# Patient Record
Sex: Female | Born: 1965
Health system: Southern US, Community
[De-identification: ages and names within clinical notes are randomized; demographics above are authoritative.]

## PROBLEM LIST (undated history)

## (undated) DIAGNOSIS — R519 Headache, unspecified: Secondary | ICD-10-CM

## (undated) DIAGNOSIS — J449 Chronic obstructive pulmonary disease, unspecified: Secondary | ICD-10-CM

## (undated) DIAGNOSIS — J45909 Unspecified asthma, uncomplicated: Secondary | ICD-10-CM

## (undated) DIAGNOSIS — G8929 Other chronic pain: Secondary | ICD-10-CM

## (undated) DIAGNOSIS — E7800 Pure hypercholesterolemia, unspecified: Secondary | ICD-10-CM

## (undated) DIAGNOSIS — I10 Essential (primary) hypertension: Secondary | ICD-10-CM

## (undated) DIAGNOSIS — I509 Heart failure, unspecified: Secondary | ICD-10-CM

## (undated) DIAGNOSIS — I209 Angina pectoris, unspecified: Secondary | ICD-10-CM

## (undated) DIAGNOSIS — I499 Cardiac arrhythmia, unspecified: Secondary | ICD-10-CM

## (undated) DIAGNOSIS — R51 Headache: Secondary | ICD-10-CM

## (undated) DIAGNOSIS — D649 Anemia, unspecified: Secondary | ICD-10-CM

## (undated) HISTORY — DX: Pure hypercholesterolemia, unspecified: E78.00

## (undated) HISTORY — DX: Other chronic pain: G89.29

## (undated) HISTORY — DX: Headache, unspecified: R51.9

## (undated) HISTORY — DX: Unspecified asthma, uncomplicated: J45.909

## (undated) HISTORY — DX: Headache: R51

---

## 1984-10-24 HISTORY — PX: TUBAL LIGATION: SHX77

## 2009-06-08 ENCOUNTER — Ambulatory Visit: Payer: Self-pay | Admitting: Obstetrics and Gynecology

## 2009-06-08 ENCOUNTER — Observation Stay (HOSPITAL_COMMUNITY): Admission: AD | Admit: 2009-06-08 | Discharge: 2009-06-09 | Payer: Self-pay | Admitting: Obstetrics & Gynecology

## 2009-06-19 ENCOUNTER — Ambulatory Visit: Payer: Self-pay | Admitting: Family Medicine

## 2009-06-19 ENCOUNTER — Ambulatory Visit (HOSPITAL_COMMUNITY): Admission: RE | Admit: 2009-06-19 | Discharge: 2009-06-19 | Payer: Self-pay | Admitting: Family Medicine

## 2009-06-19 ENCOUNTER — Encounter: Payer: Self-pay | Admitting: Family Medicine

## 2009-06-19 DIAGNOSIS — I10 Essential (primary) hypertension: Secondary | ICD-10-CM | POA: Insufficient documentation

## 2009-06-19 DIAGNOSIS — N920 Excessive and frequent menstruation with regular cycle: Secondary | ICD-10-CM

## 2009-06-19 DIAGNOSIS — Z9189 Other specified personal risk factors, not elsewhere classified: Secondary | ICD-10-CM | POA: Insufficient documentation

## 2009-06-19 LAB — CONVERTED CEMR LAB
Albumin: 3.9 g/dL (ref 3.5–5.2)
BUN: 18 mg/dL (ref 6–23)
CO2: 25 meq/L (ref 19–32)
Calcium: 9.5 mg/dL (ref 8.4–10.5)
HCT: 37.2 % (ref 36.0–46.0)
MCHC: 29.3 g/dL — ABNORMAL LOW (ref 30.0–36.0)
Platelets: 199 10*3/uL (ref 150–400)
Potassium: 3.9 meq/L (ref 3.5–5.3)
RBC: 4.7 M/uL (ref 3.87–5.11)
Total Bilirubin: 0.2 mg/dL — ABNORMAL LOW (ref 0.3–1.2)
Total Protein: 6.8 g/dL (ref 6.0–8.3)
WBC: 6.4 10*3/uL (ref 4.0–10.5)

## 2009-06-24 ENCOUNTER — Telehealth: Payer: Self-pay | Admitting: Family Medicine

## 2009-06-25 ENCOUNTER — Telehealth (INDEPENDENT_AMBULATORY_CARE_PROVIDER_SITE_OTHER): Payer: Self-pay | Admitting: Family Medicine

## 2009-06-26 ENCOUNTER — Encounter: Payer: Self-pay | Admitting: Family Medicine

## 2009-07-02 ENCOUNTER — Other Ambulatory Visit: Admission: RE | Admit: 2009-07-02 | Discharge: 2009-07-02 | Payer: Self-pay | Admitting: Obstetrics and Gynecology

## 2009-07-02 ENCOUNTER — Ambulatory Visit: Payer: Self-pay | Admitting: Obstetrics and Gynecology

## 2009-07-07 ENCOUNTER — Encounter (INDEPENDENT_AMBULATORY_CARE_PROVIDER_SITE_OTHER): Payer: Self-pay | Admitting: *Deleted

## 2009-07-07 DIAGNOSIS — F17200 Nicotine dependence, unspecified, uncomplicated: Secondary | ICD-10-CM | POA: Insufficient documentation

## 2009-07-16 ENCOUNTER — Ambulatory Visit: Payer: Self-pay | Admitting: Obstetrics & Gynecology

## 2009-07-17 ENCOUNTER — Ambulatory Visit: Payer: Self-pay | Admitting: Family Medicine

## 2009-08-05 ENCOUNTER — Encounter: Payer: Self-pay | Admitting: Family Medicine

## 2009-08-05 ENCOUNTER — Ambulatory Visit: Payer: Self-pay | Admitting: Family Medicine

## 2009-08-05 LAB — CONVERTED CEMR LAB
Amphetamine Screen, Ur: NEGATIVE
Benzodiazepines.: NEGATIVE
Chloride: 104 meq/L (ref 96–112)
Cocaine Metabolites: NEGATIVE
Ethyl Alcohol: <10 mg/dL (ref <10)
MCHC: 30.8 g/dL (ref 30.0–36.0)
Methadone: NEGATIVE
Phencyclidine (PCP): NEGATIVE
Potassium: 4.6 meq/L (ref 3.5–5.3)
Propoxyphene: NEGATIVE

## 2009-08-06 ENCOUNTER — Ambulatory Visit: Payer: Self-pay | Admitting: Obstetrics & Gynecology

## 2009-09-28 ENCOUNTER — Telehealth: Payer: Self-pay | Admitting: Family Medicine

## 2009-09-28 ENCOUNTER — Ambulatory Visit (HOSPITAL_COMMUNITY): Admission: RE | Admit: 2009-09-28 | Discharge: 2009-09-28 | Payer: Self-pay | Admitting: Obstetrics and Gynecology

## 2009-09-29 ENCOUNTER — Ambulatory Visit: Payer: Self-pay | Admitting: Family Medicine

## 2009-10-01 ENCOUNTER — Encounter: Payer: Self-pay | Admitting: Family Medicine

## 2009-10-06 ENCOUNTER — Ambulatory Visit: Payer: Self-pay | Admitting: Family Medicine

## 2009-10-20 ENCOUNTER — Encounter: Payer: Self-pay | Admitting: Family Medicine

## 2009-10-20 ENCOUNTER — Ambulatory Visit: Payer: Self-pay | Admitting: Family Medicine

## 2009-10-20 LAB — CONVERTED CEMR LAB
CO2: 25 meq/L (ref 19–32)
Creatinine, Ser: 0.9 mg/dL (ref 0.40–1.20)

## 2009-10-30 ENCOUNTER — Ambulatory Visit: Payer: Self-pay | Admitting: Family Medicine

## 2009-10-30 ENCOUNTER — Encounter: Payer: Self-pay | Admitting: Family Medicine

## 2009-11-03 ENCOUNTER — Encounter: Payer: Self-pay | Admitting: Family Medicine

## 2009-11-19 ENCOUNTER — Ambulatory Visit: Payer: Self-pay | Admitting: Obstetrics & Gynecology

## 2009-11-19 LAB — CONVERTED CEMR LAB: Hepatitis B Surface Ag: NEGATIVE

## 2009-11-23 ENCOUNTER — Encounter: Payer: Self-pay | Admitting: Family Medicine

## 2009-12-04 ENCOUNTER — Encounter: Payer: Self-pay | Admitting: Family Medicine

## 2010-05-05 ENCOUNTER — Inpatient Hospital Stay (HOSPITAL_COMMUNITY): Admission: EM | Admit: 2010-05-05 | Discharge: 2010-05-07 | Payer: Self-pay | Admitting: Emergency Medicine

## 2010-05-05 ENCOUNTER — Ambulatory Visit: Payer: Self-pay | Admitting: Cardiology

## 2010-05-05 ENCOUNTER — Encounter (INDEPENDENT_AMBULATORY_CARE_PROVIDER_SITE_OTHER): Payer: Self-pay | Admitting: Internal Medicine

## 2010-05-11 ENCOUNTER — Ambulatory Visit: Payer: Self-pay | Admitting: Family Medicine

## 2010-05-11 ENCOUNTER — Encounter: Payer: Self-pay | Admitting: Family Medicine

## 2010-05-11 DIAGNOSIS — J45909 Unspecified asthma, uncomplicated: Secondary | ICD-10-CM

## 2010-05-11 DIAGNOSIS — D500 Iron deficiency anemia secondary to blood loss (chronic): Secondary | ICD-10-CM

## 2010-05-11 DIAGNOSIS — D259 Leiomyoma of uterus, unspecified: Secondary | ICD-10-CM

## 2010-05-11 LAB — CONVERTED CEMR LAB
BUN: 13 mg/dL (ref 6–23)
CO2: 23 meq/L (ref 19–32)
Calcium: 9 mg/dL (ref 8.4–10.5)
Chloride: 107 meq/L (ref 96–112)
Hemoglobin: 10 g/dL — ABNORMAL LOW (ref 12.0–15.0)
Iron: 10 ug/dL
LDL Cholesterol: 56 mg/dL
MCHC: 27.3 g/dL — ABNORMAL LOW (ref 30.0–36.0)
Platelets: 335 10*3/uL (ref 150–400)
Saturation Ratios: 3 %
Sodium: 140 meq/L (ref 135–145)
TIBC: 369 ug/dL
Triglycerides: 72 mg/dL
WBC: 9.1 10*3/uL (ref 4.0–10.5)

## 2010-05-27 ENCOUNTER — Encounter (HOSPITAL_COMMUNITY): Admission: RE | Admit: 2010-05-27 | Discharge: 2010-07-12 | Payer: Self-pay | Admitting: Cardiology

## 2010-07-12 ENCOUNTER — Encounter: Payer: Self-pay | Admitting: Family Medicine

## 2010-09-22 ENCOUNTER — Encounter (INDEPENDENT_AMBULATORY_CARE_PROVIDER_SITE_OTHER): Payer: Self-pay | Admitting: *Deleted

## 2010-11-23 NOTE — Miscellaneous (Signed)
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  Please fax when completed. Bradly Bienenstock  December 04, 2009 2:42 PM  This form was not clear, it basically was a list of machines that she felt that she could not use.  I however think she can use all listed at very low weight with low reps, her fibroid will not interfere.  I faxed back to instructor with these directions written on the form.  Actual form is still in my mail box in front office.  Joffre Lucks FNP  form to pcp.Golden Circle RN  December 07, 2009 12:17 PM

## 2010-11-23 NOTE — Miscellaneous (Signed)
Summary: [facility]  Clinical Lists Changes notified pt that her appt was /126/11. to be there at 12:45. bring ins card, photo id, co-pay & all med bottles. stated she understood. gave her their number in case she has to change this.Golden Circle RN  November 03, 2009 11:42 AM

## 2010-11-23 NOTE — Miscellaneous (Signed)
  Clinical Lists Changes  Problems: Removed problem of CHRONIC DIASTOLIC HEART FAILURE (ICD-428.32)

## 2010-11-23 NOTE — Assessment & Plan Note (Signed)
Summary: FU/KH   Vital Signs:  Patient profile:   45 year old female Height:      60 inches Weight:      173 pounds Pulse rate:   80 / minute BP sitting:   120 / 80  (right arm)  Vitals Entered By: Arlyss Repress CMA, (October 30, 2009 8:32 AM) CC: f/up HTN Is Patient Diabetic? No Pain Assessment Patient in pain? no        Primary Care Provider:  Doree Albee MD  CC:  f/up HTN.  History of Present Illness: Third follow up apt for titration of BP medicaions.  GYN surgery canceled one month ago because of HTN on one agent.  Dr. Ladean Raya was scheduled to preform a fibroid ablation procedure.    She reports not using crack for over 3 weeks.  She has not made the call to NA to begin meeting.  She is excited to begin classes at [employer].  Habits & Providers  Alcohol-Tobacco-Diet     Tobacco Status: quit  Comments: x 3 weeks...in process of quitting  Current Medications (verified): 1)  Lisinopril-Hydrochlorothiazide 20-12.5 Mg Tabs (Lisinopril-Hydrochlorothiazide) .... One Daily 2)  Amlodipine Besylate 10 Mg Tabs (Amlodipine Besylate) .... One Daily  Allergies (verified): No Known Drug Allergies  Physical Exam  General:  Well-developed,well-nourished,in no acute distress; alert,appropriate and cooperative throughout examination Lungs:  normal respiratory effort.   Heart:  normal rate and regular rhythm.     Impression & Recommendations:  Problem # 1:  ESSENTIAL HYPERTENSION, BENIGN (ICD-401.1)  at goal today Her updated medication list for this problem includes:    Lisinopril-hydrochlorothiazide 20-12.5 Mg Tabs (Lisinopril-hydrochlorothiazide) ..... One daily    Amlodipine Besylate 10 Mg Tabs (Amlodipine besylate) ..... One daily  Orders: [facility]- Est Level  2 ([address])  Problem # 2:  COCAINE ABUSE, HX OF (ICD-V15.9)  last used 3 weeks ago, encouraged to call NA, numbers given last visit  Orders: [facility]- Est Level  2 ([address])  Complete Medication List: 1)   Lisinopril-hydrochlorothiazide 20-12.5 Mg Tabs (Lisinopril-hydrochlorothiazide) .... One daily 2)  Amlodipine Besylate 10 Mg Tabs (Amlodipine besylate) .... One daily  Patient Instructions: 1)  Continue to limit sodium 2)  I think you would benefit from NA meetings 3)  You have done great 4)  I will contact Dr. Beryle Lathe office and I would like you to call them as well  5)  Return apt in 3 months Prescriptions: AMLODIPINE BESYLATE 10 MG TABS (AMLODIPINE BESYLATE) one daily  #90 x 3   Entered and Authorized by:   Luretha Murphy NP   Signed by:   Dennison Nancy RN on 10/30/2009   Method used:   Electronically to        [facility] [phone]* (retail)       [address]       [place], [place]  [address]       Ph: [phone]       Fax: [phone]   RxID:   [id number] LISINOPRIL-HYDROCHLOROTHIAZIDE 20-12.5 MG TABS (LISINOPRIL-HYDROCHLOROTHIAZIDE) one daily  #90 x 3   Entered and Authorized by:   Luretha Murphy NP   Signed by:   Dennison Nancy RN on 10/30/2009   Method used:   Electronically to        [facility] [phone]* (retail)       [address]       [place], [place]  [address]  Ph: [phone]       Fax: [phone]   RxID:   [id number]

## 2010-11-23 NOTE — Assessment & Plan Note (Signed)
Summary: f/u hospital stay/eo   Vital Signs:  Patient profile:   45 year old female Height:      60 inches Weight:      186.6 pounds BMI:     36.57 O2 Sat:      98 % on Room air Temp:     98.1 degrees F oral Pulse rate:   63 / minute BP sitting:   153 / 110  (left arm) Cuff size:   regular  Vitals Entered By: Gladstone Pih (May 11, 2010 11:30 AM)  O2 Flow:  Room air CC: Hosp F/U Is Patient Diabetic? No Pain Assessment Patient in pain? no        Primary Care Provider:  Doree Albee MD  CC:  Hosp F/U.  History of Present Illness: Recently discharged from the hospital after presenting to the ER with DOE.  She was found to have a Hbg of 7 and received transfusions.  During that stay she was worked up by cardiology (history of crack cocaine abuse for 20 years) Hospitalized from July 12-15.  Workup for MI was negative, ECHO showed Grade I diastolic dysfunction.  Labs and diagnostic tests were reviewed.  She was to have had her hysterectomy months ago, but she says that it will be rescheduled in September. It was discussed that this needs to be done much sooner, as low blood counts are life threatning.  She wants to go back to school as she lives on her financial aid.  She can only walk short distances.  She has congestion in her lungs all the time.  She has quit smoking recently, she reports no crack for almost one year.  She has no  health insurance and will see the financial counselor at the hospital for certification.  Habits & Providers  Alcohol-Tobacco-Diet     Tobacco Status: quit     Tobacco Counseling: to quit use of tobacco products     Year Quit: 2011  Current Medications (verified): 1)  Carvedilol 6.25 Mg Tabs (Carvedilol) .... One Two Times A Day 2)  Furosemide 20 Mg Tabs (Furosemide) .... One Daily 3)  Prilosec 20 Mg Cpdr (Omeprazole) 4)  Lisinopril 10 Mg Tabs (Lisinopril) .... One Daily 5)  Ventolin Hfa 108 (90 Base) Mcg/act Aers (Albuterol Sulfate) .... 2  Puffs Qid Prn  Allergies (verified): No Known Drug Allergies  Social History: Denies EtOH (ever), + crack smoker for >49yrs, states clean since 05/2009.  Smoking 2cig/day tobacco. One child in jail, one lives in [place], has a boyfriend with whom she lives  Physical Exam  General:  Short of breath walking back to the room. Lungs:  Diffuse rhonchi and expiratory wheeze Heart:  normal rate and regular rhythm.     Impression & Recommendations:  Problem # 1:  ANEMIA, SECONDARY TO BLOOD LOSS (ICD-280.0) She must schedule her surgery, as she is bleeding down to HBG below 7, recently hospitalization for blood transfuion.  Follow up in one week Orders: [facility]- Est  Level 4 ([address])  Problem # 2:  COPD (ICD-496) Will schedule PFTs at next visit.  She cannot afford MDI.  She will try to get certified with [employer] so that she can go through the MAP program.  The following medications were removed from the medication list:    Albuterol Powd (Albuterol) ..... One dialy Her updated medication list for this problem includes:    Ventolin Hfa 108 (90 Base) Mcg/act Aers (Albuterol sulfate) .Marland Kitchen... 2 puffs qid prn  Problem #  3:  CHRONIC DIASTOLIC HEART FAILURE (ICD-428.32) Will continue meds started in hospital. The following medications were removed from the medication list:    Lisinopril-hydrochlorothiazide 20-12.5 Mg Tabs (Lisinopril-hydrochlorothiazide) ..... One daily Her updated medication list for this problem includes:    Carvedilol 6.25 Mg Tabs (Carvedilol) ..... One two times a day    Furosemide 20 Mg Tabs (Furosemide) ..... One daily    Lisinopril 10 Mg Tabs (Lisinopril) ..... One daily  Orders: [facility]- Est  Level 4 ([address])  Complete Medication List: 1)  Carvedilol 6.25 Mg Tabs (Carvedilol) .... One two times a day 2)  Furosemide 20 Mg Tabs (Furosemide) .... One daily 3)  Prilosec 20 Mg Cpdr (Omeprazole) 4)  Lisinopril 10 Mg Tabs (Lisinopril) .... One daily 5)  Ventolin Hfa 108  (90 Base) Mcg/act Aers (Albuterol sulfate) .... 2 puffs qid prn  Other Orders: Basic Met-[facility] ([address]454-09811) CBC-[facility] (91478)  Patient Instructions: 1)  You must get your BP meds fillled 2)  Call [facility] and let them know that you needed another transfusion and can they push your surgery up. 3)  Please schedule a follow-up appointment in 1 month.  Prescriptions: VENTOLIN HFA 108 (90 BASE) MCG/ACT AERS (ALBUTEROL SULFATE) 2 puffs qid prn Brand medically necessary #1 x 11   Entered and Authorized by:   Luretha Murphy NP   Signed by:   Luretha Murphy NP on 05/11/2010   Method used:   Print then Give to Patient   RxID:   [id number]     Medication Administration  Medication # 3:    Medication: Albuterol Sulfate Sol 1mg  unit dose    Dose: 2.5/29ml    Route: inhaled    Exp Date: 09/2011    Lot #: [id number]    Mfr: nephron    Given by: Gladstone Pih (May 11, 2010 1:41 PM)  Orders Added: 1)  Basic Met-[facility] [41324-40102] 2)  CBC-[facility] [85027] 3)  [facility]- Est  Level 4 [72536]

## 2010-11-23 NOTE — Letter (Signed)
Summary: *Referral Letter  [facility]  [address]   [place], [place] [address]   Phone: [phone]  Fax: [phone]    10/30/2009  Dr. Ladean Raya:  I believe it is safe to reschedule her surgery for DUB.  She has made 3 visits for titration of her BP meds and today had a BP of 120/80.  See list below:  [facility] [address] [place], [place]  [address]  Phone: [phone]  Reason for Referral: Reschedule surgery   Current Medical Problems: 1)  TOBACCO USER (ICD-305.1) 2)  COCAINE ABUSE, HX OF (ICD-V15.9) 3)  UNSPECIFIED PRE-OPERATIVE EXAMINATION (ICD-V72.84) 4)  ESSENTIAL HYPERTENSION, BENIGN (ICD-401.1) 5)  MENORRHAGIA (ICD-626.2)   Current Medications: 1)  LISINOPRIL-HYDROCHLOROTHIAZIDE 20-12.5 MG TABS (LISINOPRIL-HYDROCHLOROTHIAZIDE) one daily 2)  AMLODIPINE BESYLATE 10 MG TABS (AMLODIPINE BESYLATE) one daily   Past Medical History: 1)  per patient MI in past at [facility] had "fluid in lungs and around heart", told had irregular heart beat >10 yrs ago, no records available 2)  ?CAD 3)  Menorrhagia     Pertinent Labs:    Thank you again for agreeing to see our patient; please contact us if you have any further questions or need additional information.  Sincerely,  Stacey Murphy NP  Appended Document: *Referral Letter .RN spoke with Dr. Ladean Raya also and she states she is willing to do the surgery but it is anesthesia that will not proceed if BP is elevated. This is what occured last time when she was scheduled .  Her office will contact patient and have her come back in . The above letter faxed to Dr. Ladean Raya.

## 2010-11-23 NOTE — Miscellaneous (Signed)
Summary: med record request  Clinical Lists Changes  Rec'd med record request from [employer] faxed 06/03/2010 Marily Memos  September 22, 2010 2:55 PM

## 2010-11-23 NOTE — Consult Note (Signed)
Summary: [facility]  [facility]   Imported By: Clydell Hakim 12/11/2009 15:34:41  _____________________________________________________________________  External Attachment:    Type:   Image     Comment:   External Document

## 2010-12-01 ENCOUNTER — Encounter: Payer: Self-pay | Admitting: *Deleted

## 2011-01-08 LAB — BASIC METABOLIC PANEL
BUN: 13 mg/dL (ref 6–23)
CO2: 26 mEq/L (ref 19–32)
Calcium: 8.8 mg/dL (ref 8.4–10.5)
Chloride: 104 mEq/L (ref 96–112)
Creatinine, Ser: 1.12 mg/dL (ref 0.4–1.2)
GFR calc Af Amer: >60 mL/min (ref >60)
GFR calc non Af Amer: 52 mL/min — ABNORMAL LOW (ref >60)
Glucose, Bld: 109 mg/dL — ABNORMAL HIGH (ref 70–99)
Potassium: 4.3 mEq/L (ref 3.5–5.1)
Sodium: 137 mEq/L (ref 135–145)

## 2011-01-09 LAB — CBC
HCT: 31 % — ABNORMAL LOW (ref 36.0–46.0)
HCT: 34.7 % — ABNORMAL LOW (ref 36.0–46.0)
Hemoglobin: 10.8 g/dL — ABNORMAL LOW (ref 12.0–15.0)
MCH: 22.4 pg — ABNORMAL LOW (ref 26.0–34.0)
MCV: 68.8 fL — ABNORMAL LOW (ref 78.0–100.0)
MCV: 71.4 fL — ABNORMAL LOW (ref 78.0–100.0)
MCV: 71.8 fL — ABNORMAL LOW (ref 78.0–100.0)
Platelets: 248 10*3/uL (ref 150–400)
Platelets: 271 10*3/uL (ref 150–400)
Platelets: 286 10*3/uL (ref 150–400)
RBC: 3.58 MIL/uL — ABNORMAL LOW (ref 3.87–5.11)
RBC: 4.34 MIL/uL (ref 3.87–5.11)
RBC: 4.83 MIL/uL (ref 3.87–5.11)
RDW: 18 % — ABNORMAL HIGH (ref 11.5–15.5)
RDW: 19.4 % — ABNORMAL HIGH (ref 11.5–15.5)
WBC: 7.4 10*3/uL (ref 4.0–10.5)
WBC: 8.7 10*3/uL (ref 4.0–10.5)

## 2011-01-09 LAB — COMPREHENSIVE METABOLIC PANEL
ALT: 18 U/L (ref 0–35)
Albumin: 3.5 g/dL (ref 3.5–5.2)
Alkaline Phosphatase: 74 U/L (ref 39–117)
BUN: 7 mg/dL (ref 6–23)
Chloride: 101 mEq/L (ref 96–112)
Potassium: 3.5 mEq/L (ref 3.5–5.1)
Sodium: 136 mEq/L (ref 135–145)
Total Bilirubin: 0.7 mg/dL (ref 0.3–1.2)
Total Protein: 7.3 g/dL (ref 6.0–8.3)

## 2011-01-09 LAB — LIPID PANEL
HDL: 45 mg/dL (ref >39)
Triglycerides: 72 mg/dL (ref <150)
VLDL: 14 mg/dL (ref 0–40)

## 2011-01-09 LAB — CROSSMATCH: ABO/RH(D): A POS

## 2011-01-09 LAB — URINALYSIS, ROUTINE W REFLEX MICROSCOPIC
Glucose, UA: NEGATIVE mg/dL
Hgb urine dipstick: NEGATIVE
Urobilinogen, UA: 0.2 mg/dL (ref 0.0–1.0)
pH: 5 (ref 5.0–8.0)

## 2011-01-09 LAB — POCT PREGNANCY, URINE: Preg Test, Ur: NEGATIVE

## 2011-01-09 LAB — BASIC METABOLIC PANEL
CO2: 27 mEq/L (ref 19–32)
Chloride: 101 mEq/L (ref 96–112)
GFR calc Af Amer: >60 mL/min (ref >60)
Potassium: 3.3 mEq/L — ABNORMAL LOW (ref 3.5–5.1)
Sodium: 137 mEq/L (ref 135–145)

## 2011-01-09 LAB — TSH: TSH: 1.921 u[IU]/mL (ref 0.350–4.500)

## 2011-01-09 LAB — CK TOTAL AND CKMB (NOT AT ARMC)
CK, MB: 1.1 ng/mL (ref 0.3–4.0)
Relative Index: 0.5 (ref 0.0–2.5)

## 2011-01-09 LAB — CARDIAC PANEL(CRET KIN+CKTOT+MB+TROPI)
CK, MB: 1.7 ng/mL (ref 0.3–4.0)
Relative Index: 0.5 (ref 0.0–2.5)
Troponin I: 0.02 ng/mL (ref 0.00–0.06)

## 2011-01-09 LAB — TROPONIN I: Troponin I: 0.02 ng/mL (ref 0.00–0.06)

## 2011-01-09 LAB — ABO/RH: ABO/RH(D): A POS

## 2011-01-09 LAB — DIFFERENTIAL
Basophils Absolute: 0.1 10*3/uL (ref 0.0–0.1)
Basophils Relative: 1 % (ref 0–1)
Eosinophils Absolute: 0.2 10*3/uL (ref 0.0–0.7)
Eosinophils Relative: 3 % (ref 0–5)
Monocytes Relative: 7 % (ref 3–12)
Neutro Abs: 4.2 10*3/uL (ref 1.7–7.7)

## 2011-01-09 LAB — IRON AND TIBC
Iron: 10 ug/dL — ABNORMAL LOW (ref 42–135)
Saturation Ratios: 3 % — ABNORMAL LOW (ref 20–55)
TIBC: 369 ug/dL (ref 250–470)
UIBC: 359 ug/dL

## 2011-01-09 LAB — RAPID URINE DRUG SCREEN, HOSP PERFORMED
Amphetamines: NOT DETECTED
Barbiturates: NOT DETECTED

## 2011-01-09 LAB — PROTIME-INR: Prothrombin Time: 13.2 seconds (ref 11.6–15.2)

## 2011-01-09 LAB — POCT CARDIAC MARKERS
CKMB, poc: <1 ng/mL — ABNORMAL LOW (ref 1.0–8.0)
Myoglobin, poc: 125 ng/mL (ref 12–200)

## 2011-01-09 LAB — BRAIN NATRIURETIC PEPTIDE
Pro B Natriuretic peptide (BNP): <30 pg/mL (ref 0.0–100.0)
Pro B Natriuretic peptide (BNP): <30 pg/mL (ref 0.0–100.0)

## 2011-01-25 LAB — BASIC METABOLIC PANEL
BUN: 17 mg/dL (ref 6–23)
CO2: 30 mEq/L (ref 19–32)
Chloride: 101 mEq/L (ref 96–112)
Glucose, Bld: 93 mg/dL (ref 70–99)
Potassium: 3.8 mEq/L (ref 3.5–5.1)

## 2011-01-25 LAB — CROSSMATCH

## 2011-01-25 LAB — CBC
HCT: 37 % (ref 36.0–46.0)
MCHC: 32.6 g/dL (ref 30.0–36.0)
MCV: 88.8 fL (ref 78.0–100.0)
Platelets: 325 10*3/uL (ref 150–400)
WBC: 7.8 10*3/uL (ref 4.0–10.5)

## 2011-01-28 LAB — POCT PREGNANCY, URINE: Preg Test, Ur: NEGATIVE

## 2011-01-29 LAB — CROSSMATCH

## 2011-01-29 LAB — URINALYSIS, ROUTINE W REFLEX MICROSCOPIC
Leukocytes, UA: NEGATIVE
Nitrite: NEGATIVE
Specific Gravity, Urine: 1.015 (ref 1.005–1.030)
pH: 7 (ref 5.0–8.0)

## 2011-01-29 LAB — URINE DRUGS OF ABUSE SCREEN W ALC, ROUTINE (REF LAB)
Barbiturate Quant, Ur: NEGATIVE
Benzodiazepines.: NEGATIVE
Methadone: NEGATIVE
Opiate Screen, Urine: NEGATIVE
Phencyclidine (PCP): NEGATIVE

## 2011-01-29 LAB — COMPREHENSIVE METABOLIC PANEL
AST: 17 U/L (ref 0–37)
BUN: 9 mg/dL (ref 6–23)
CO2: 26 mEq/L (ref 19–32)
Chloride: 103 mEq/L (ref 96–112)
Creatinine, Ser: 0.9 mg/dL (ref 0.4–1.2)
GFR calc Af Amer: >60 mL/min (ref >60)
GFR calc non Af Amer: >60 mL/min (ref >60)
Glucose, Bld: 101 mg/dL — ABNORMAL HIGH (ref 70–99)
Total Bilirubin: 0.5 mg/dL (ref 0.3–1.2)

## 2011-01-29 LAB — WET PREP, GENITAL: Yeast Wet Prep HPF POC: NONE SEEN

## 2011-01-29 LAB — CBC
HCT: 36.4 % (ref 36.0–46.0)
MCHC: 29.2 g/dL — ABNORMAL LOW (ref 30.0–36.0)
MCV: 72.3 fL — ABNORMAL LOW (ref 78.0–100.0)
Platelets: 261 10*3/uL (ref 150–400)
Platelets: 262 10*3/uL (ref 150–400)
RDW: 19 % — ABNORMAL HIGH (ref 11.5–15.5)
RDW: 23.5 % — ABNORMAL HIGH (ref 11.5–15.5)

## 2011-01-29 LAB — GC/CHLAMYDIA PROBE AMP, GENITAL
Chlamydia, DNA Probe: NEGATIVE
GC Probe Amp, Genital: NEGATIVE

## 2011-01-29 LAB — URINE MICROSCOPIC-ADD ON

## 2011-01-29 LAB — POCT PREGNANCY, URINE: Preg Test, Ur: NEGATIVE

## 2011-01-29 LAB — ABO/RH: ABO/RH(D): A POS

## 2011-03-08 NOTE — Discharge Summary (Signed)
NAME:  Stacey Lin, Stacey Lin      ACCOUNT NO.:  [id number]   MEDICAL RECORD NO.:  [id number]          PATIENT TYPE:  INP   LOCATION:  9305                          FACILITY:  [facility]   PHYSICIAN:  Catalina Antigua, MD     DATE OF BIRTH:  October 22, 1966   DATE OF ADMISSION:  06/08/2009  DATE OF DISCHARGE:  06/09/2009                               DISCHARGE SUMMARY   This is a 45 year old para 2, who was admitted for a 2-year history of  menorrhagia and presented to the hospital complaining of worsening  vaginal bleeding with passage of clots and generalized weakness.  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  The patient reports a significant decrease in the intake of crack  cocaine and stated that she is only used may be 2 or 3 times in the past  5-6 months.  Upon admission, the patient had an H and H of 5.6/19.  Given her history of MI and generalized weakness, decision was made to  admit her for transfusion.   In terms of pertinent laboratories, admission CBC was 5.6/19.  She is  negative for hepatitis B and negative for HIV.  Drug screen was positive  for cocaine; and admission ultrasound revealed an enlarged uterus  measuring 15 x 9 x 11 cm that was very heterogenous with multiple  fibroids.  The endometrium was not well visualized and normal appearing  ovaries.  Following 4 units of packed red blood cells, her H and H  became 11.6/36.4.  Admission lab also included, the patient was also  found to be positive for trichomoniasis.   FINAL DIAGNOSIS:  Severe anemia due to menorrhagia secondary to a  fibroid uterus.   SIGNIFICANT FINDINGS:  Again, severe anemia 20 weeks size fibroid uterus  on physical exam, which was confirmed by ultrasound.  The treatment the  patient received was 4 units of packed red blood cells.   She also  received hydrochlorothiazide 25 mg p.o. daily for blood pressure  control.  She was treated with Flagyl for her positive Trichomonas.  She  also received iron for her anemia and a discharge dose of Lupron 11.25  mg.   CONDITION ON DISCHARGE:  The patient was stable with H and H of  11.6/36.4.   Instructions given to the patient were to follow up with the [facility] on June 19, 2009 at 2 p.m. for medical clearance and a  GYN Clinic appointment on July 02, 2009 at 1 p.m. for preoperative  planning regarding her desire for permanent treatment of her menorrhagia  via hysterectomy.   DISCHARGE INSTRUCTIONS:  The patient was instructed to take her  hydrochlorothiazide in order to ensure adequate blood pressure control  at the time of her surgery as well as to complete her course of Flagyl  for the treatment of trichomoniasis and to continue taking her iron and  Colace that aide in her anemia.  The patient was instructed to  take a  low-sodium diet.      Catalina Antigua, MD  Electronically Signed    PC/MEDQ  D:  06/09/2009  T:  06/10/2009  Job:  [id number]

## 2013-08-19 ENCOUNTER — Inpatient Hospital Stay (HOSPITAL_COMMUNITY)
Admission: EM | Admit: 2013-08-19 | Discharge: 2013-08-28 | DRG: 292 | Disposition: A | Discharge status: Home / Self Care | Payer: Medicaid Other | Attending: Family Medicine | Admitting: Family Medicine

## 2013-08-19 ENCOUNTER — Inpatient Hospital Stay (HOSPITAL_COMMUNITY): Payer: Medicaid Other

## 2013-08-19 ENCOUNTER — Emergency Department (HOSPITAL_COMMUNITY): Payer: Medicaid Other

## 2013-08-19 ENCOUNTER — Encounter (HOSPITAL_COMMUNITY): Payer: Self-pay | Admitting: Emergency Medicine

## 2013-08-19 DIAGNOSIS — E876 Hypokalemia: Secondary | ICD-10-CM | POA: Diagnosis not present

## 2013-08-19 DIAGNOSIS — N179 Acute kidney failure, unspecified: Secondary | ICD-10-CM | POA: Diagnosis not present

## 2013-08-19 DIAGNOSIS — E875 Hyperkalemia: Secondary | ICD-10-CM | POA: Diagnosis not present

## 2013-08-19 DIAGNOSIS — I2510 Atherosclerotic heart disease of native coronary artery without angina pectoris: Secondary | ICD-10-CM | POA: Diagnosis present

## 2013-08-19 DIAGNOSIS — Z91199 Patient's noncompliance with other medical treatment and regimen due to unspecified reason: Secondary | ICD-10-CM

## 2013-08-19 DIAGNOSIS — I059 Rheumatic mitral valve disease, unspecified: Secondary | ICD-10-CM | POA: Diagnosis present

## 2013-08-19 DIAGNOSIS — Z9119 Patient's noncompliance with other medical treatment and regimen: Secondary | ICD-10-CM

## 2013-08-19 DIAGNOSIS — J4489 Other specified chronic obstructive pulmonary disease: Secondary | ICD-10-CM

## 2013-08-19 DIAGNOSIS — F1411 Cocaine abuse, in remission: Secondary | ICD-10-CM | POA: Diagnosis present

## 2013-08-19 DIAGNOSIS — I428 Other cardiomyopathies: Secondary | ICD-10-CM | POA: Diagnosis present

## 2013-08-19 DIAGNOSIS — D259 Leiomyoma of uterus, unspecified: Secondary | ICD-10-CM

## 2013-08-19 DIAGNOSIS — I5021 Acute systolic (congestive) heart failure: Secondary | ICD-10-CM

## 2013-08-19 DIAGNOSIS — I079 Rheumatic tricuspid valve disease, unspecified: Secondary | ICD-10-CM | POA: Diagnosis present

## 2013-08-19 DIAGNOSIS — J449 Chronic obstructive pulmonary disease, unspecified: Secondary | ICD-10-CM

## 2013-08-19 DIAGNOSIS — R0602 Shortness of breath: Secondary | ICD-10-CM | POA: Diagnosis present

## 2013-08-19 DIAGNOSIS — I5043 Acute on chronic combined systolic (congestive) and diastolic (congestive) heart failure: Secondary | ICD-10-CM | POA: Diagnosis present

## 2013-08-19 DIAGNOSIS — I161 Hypertensive emergency: Secondary | ICD-10-CM

## 2013-08-19 DIAGNOSIS — T502X5A Adverse effect of carbonic-anhydrase inhibitors, benzothiadiazides and other diuretics, initial encounter: Secondary | ICD-10-CM | POA: Diagnosis present

## 2013-08-19 DIAGNOSIS — F17200 Nicotine dependence, unspecified, uncomplicated: Secondary | ICD-10-CM | POA: Diagnosis present

## 2013-08-19 DIAGNOSIS — I340 Nonrheumatic mitral (valve) insufficiency: Secondary | ICD-10-CM

## 2013-08-19 DIAGNOSIS — D509 Iron deficiency anemia, unspecified: Secondary | ICD-10-CM | POA: Diagnosis present

## 2013-08-19 DIAGNOSIS — I10 Essential (primary) hypertension: Secondary | ICD-10-CM | POA: Diagnosis present

## 2013-08-19 DIAGNOSIS — I509 Heart failure, unspecified: Secondary | ICD-10-CM | POA: Diagnosis present

## 2013-08-19 DIAGNOSIS — Z23 Encounter for immunization: Secondary | ICD-10-CM | POA: Diagnosis not present

## 2013-08-19 DIAGNOSIS — J811 Chronic pulmonary edema: Secondary | ICD-10-CM

## 2013-08-19 DIAGNOSIS — D638 Anemia in other chronic diseases classified elsewhere: Secondary | ICD-10-CM | POA: Diagnosis present

## 2013-08-19 DIAGNOSIS — D500 Iron deficiency anemia secondary to blood loss (chronic): Secondary | ICD-10-CM

## 2013-08-19 DIAGNOSIS — I501 Left ventricular failure: Secondary | ICD-10-CM

## 2013-08-19 HISTORY — DX: Anemia, unspecified: D64.9

## 2013-08-19 HISTORY — DX: Essential (primary) hypertension: I10

## 2013-08-19 HISTORY — DX: Cardiac arrhythmia, unspecified: I49.9

## 2013-08-19 LAB — RETICULOCYTES
RBC.: 4.69 MIL/uL (ref 3.87–5.11)
Retic Count, Absolute: 103.2 10*3/uL (ref 19.0–186.0)
Retic Ct Pct: 2.2 % (ref 0.4–3.1)

## 2013-08-19 LAB — COMPREHENSIVE METABOLIC PANEL
ALT: 20 U/L (ref 0–35)
AST: 21 U/L (ref 0–37)
Albumin: 3 g/dL — ABNORMAL LOW (ref 3.5–5.2)
Alkaline Phosphatase: 89 U/L (ref 39–117)
CO2: 27 mEq/L (ref 19–32)
Glucose, Bld: 132 mg/dL — ABNORMAL HIGH (ref 70–99)
Potassium: 2.9 mEq/L — ABNORMAL LOW (ref 3.5–5.1)
Sodium: 136 mEq/L (ref 135–145)
Total Bilirubin: 0.7 mg/dL (ref 0.3–1.2)
Total Protein: 6.1 g/dL (ref 6.0–8.3)

## 2013-08-19 LAB — BASIC METABOLIC PANEL
BUN: 13 mg/dL (ref 6–23)
Creatinine, Ser: 0.99 mg/dL (ref 0.50–1.10)
GFR calc Af Amer: 76 mL/min — ABNORMAL LOW (ref >90)
GFR calc non Af Amer: 66 mL/min — ABNORMAL LOW (ref >90)
Potassium: 4.3 mEq/L (ref 3.5–5.1)

## 2013-08-19 LAB — CBC
HCT: 33.2 % — ABNORMAL LOW (ref 36.0–46.0)
MCH: 19.6 pg — ABNORMAL LOW (ref 26.0–34.0)
MCHC: 26.3 g/dL — ABNORMAL LOW (ref 30.0–36.0)
MCHC: 27.7 g/dL — ABNORMAL LOW (ref 30.0–36.0)
MCV: 70.8 fL — ABNORMAL LOW (ref 78.0–100.0)
RDW: 19.2 % — ABNORMAL HIGH (ref 11.5–15.5)
RDW: 19.2 % — ABNORMAL HIGH (ref 11.5–15.5)
WBC: 9.2 10*3/uL (ref 4.0–10.5)

## 2013-08-19 LAB — POCT I-STAT TROPONIN I: Troponin i, poc: 0 ng/mL (ref 0.00–0.08)

## 2013-08-19 LAB — RAPID URINE DRUG SCREEN, HOSP PERFORMED
Amphetamines: NOT DETECTED
Tetrahydrocannabinol: NOT DETECTED

## 2013-08-19 LAB — ETHANOL: Alcohol, Ethyl (B): <11 mg/dL (ref 0–11)

## 2013-08-19 MED ORDER — HYDRALAZINE HCL 20 MG/ML IJ SOLN: 5.0000 mg | INTRAMUSCULAR | Status: DC | PRN

## 2013-08-19 MED ORDER — ACETAMINOPHEN 325 MG PO TABS
650.0000 mg | ORAL_TABLET | ORAL | Status: DC | PRN
  Administered 2013-08-21 – 2013-08-28 (×11): 650 mg via ORAL
  Filled 2013-08-19 (×12): qty 2

## 2013-08-19 MED ORDER — ALBUTEROL SULFATE (5 MG/ML) 0.5% IN NEBU
5.0000 mg | INHALATION_SOLUTION | Freq: Once | RESPIRATORY_TRACT | Status: AC
  Administered 2013-08-19: 5 mg via RESPIRATORY_TRACT
  Filled 2013-08-19: qty 1

## 2013-08-19 MED ORDER — SODIUM CHLORIDE 0.9 % IJ SOLN
3.0000 mL | Freq: Two times a day (BID) | INTRAMUSCULAR | Status: DC
  Administered 2013-08-20 – 2013-08-27 (×17): 3 mL via INTRAVENOUS

## 2013-08-19 MED ORDER — INFLUENZA VAC SPLIT QUAD 0.5 ML IM SUSP
0.5000 mL | INTRAMUSCULAR | Status: AC
  Administered 2013-08-20: 0.5 mL via INTRAMUSCULAR
  Filled 2013-08-19: qty 0.5

## 2013-08-19 MED ORDER — HEPARIN SODIUM (PORCINE) 5000 UNIT/ML IJ SOLN
5000.0000 [IU] | Freq: Three times a day (TID) | INTRAMUSCULAR | Status: DC
  Administered 2013-08-20 – 2013-08-28 (×25): 5000 [IU] via SUBCUTANEOUS
  Filled 2013-08-19 (×29): qty 1

## 2013-08-19 MED ORDER — ONDANSETRON HCL 4 MG/2ML IJ SOLN
4.0000 mg | Freq: Four times a day (QID) | INTRAMUSCULAR | Status: DC | PRN
  Administered 2013-08-21: 4 mg via INTRAVENOUS
  Filled 2013-08-19: qty 2

## 2013-08-19 MED ORDER — FUROSEMIDE 10 MG/ML IJ SOLN
40.0000 mg | Freq: Two times a day (BID) | INTRAMUSCULAR | Status: DC
  Administered 2013-08-20 – 2013-08-22 (×5): 40 mg via INTRAVENOUS
  Filled 2013-08-19 (×7): qty 4

## 2013-08-19 MED ORDER — NITROGLYCERIN IN D5W 200-5 MCG/ML-% IV SOLN
10.0000 ug/min | INTRAVENOUS | Status: DC
  Administered 2013-08-19: 10 ug/min via INTRAVENOUS
  Filled 2013-08-19: qty 250

## 2013-08-19 MED ORDER — FUROSEMIDE 10 MG/ML IJ SOLN
40.0000 mg | Freq: Once | INTRAMUSCULAR | Status: AC
  Administered 2013-08-19: 40 mg via INTRAVENOUS
  Filled 2013-08-19: qty 4

## 2013-08-19 MED ORDER — SODIUM CHLORIDE 0.9 % IJ SOLN: 3.0000 mL | INTRAMUSCULAR | Status: DC | PRN

## 2013-08-19 MED ORDER — IOHEXOL 350 MG/ML SOLN
80.0000 mL | Freq: Once | INTRAVENOUS | Status: AC | PRN
  Administered 2013-08-19: 80 mL via INTRAVENOUS

## 2013-08-19 MED ORDER — SODIUM CHLORIDE 0.9 % IV SOLN
1020.0000 mg | Freq: Once | INTRAVENOUS | Status: AC
  Administered 2013-08-20: 1020 mg via INTRAVENOUS
  Filled 2013-08-19 (×2): qty 34

## 2013-08-19 MED ORDER — ASPIRIN EC 81 MG PO TBEC
81.0000 mg | DELAYED_RELEASE_TABLET | Freq: Every day | ORAL | Status: DC
  Administered 2013-08-20 – 2013-08-28 (×9): 81 mg via ORAL
  Filled 2013-08-19 (×10): qty 1

## 2013-08-19 MED ORDER — PNEUMOCOCCAL VAC POLYVALENT 25 MCG/0.5ML IJ INJ
0.5000 mL | INJECTION | INTRAMUSCULAR | Status: AC
  Administered 2013-08-20: 0.5 mL via INTRAMUSCULAR
  Filled 2013-08-19: qty 0.5

## 2013-08-19 MED ORDER — ASPIRIN 81 MG PO CHEW
324.0000 mg | CHEWABLE_TABLET | Freq: Once | ORAL | Status: AC
  Administered 2013-08-19: 324 mg via ORAL
  Filled 2013-08-19: qty 4

## 2013-08-19 MED ORDER — HYDROCHLOROTHIAZIDE 10 MG/ML ORAL SUSPENSION
6.2500 mg | Freq: Every day | ORAL | Status: DC
  Administered 2013-08-20 – 2013-08-22 (×4): 6.25 mg via ORAL
  Filled 2013-08-19 (×5): qty 1.25

## 2013-08-19 MED ORDER — LISINOPRIL 5 MG PO TABS
5.0000 mg | ORAL_TABLET | Freq: Every day | ORAL | Status: DC
  Administered 2013-08-20 – 2013-08-28 (×10): 5 mg via ORAL
  Filled 2013-08-19 (×11): qty 1

## 2013-08-19 MED ORDER — SODIUM CHLORIDE 0.9 % IV SOLN: 250.0000 mL | INTRAVENOUS | Status: DC | PRN

## 2013-08-19 NOTE — ED Notes (Signed)
Per Dr. Evelena Peat, hold Nitroglycerin at this time.

## 2013-08-19 NOTE — ED Notes (Signed)
Dr. Herma Carson at bedside for assessment

## 2013-08-19 NOTE — ED Notes (Addendum)
States for past several weeks she has been lightheaded, noticed swelling in her R leg, difficulty breathing, unable to sleep at night, unable to lie flat due to SOB and unable to complete daily acitivities due to SOB. Pt with labored breathing on arrival to ED, able to speak only in short phrases, states she has been this SOB for 2 weeks. Pt c/o back pain now.

## 2013-08-19 NOTE — Consult Note (Signed)
PULMONARY  / CRITICAL CARE MEDICINE  Name: Stacey Lin MRN: [id number] DOB: [birth year]    ADMISSION DATE:  08/19/2013 CONSULTATION DATE:  08/19/2013  REFERRING MD :  EMD PRIMARY SERVICE:  TRH  CHIEF COMPLAINT:  Dyspnea  BRIEF PATIENT DESCRIPTION: 47 yo with history of HTN, CAD and Cocaine use (denies recent) brought to ED with hypertensive emergency and pulmonary edema.  Rapidly improved after Lasix and Nitroglycerin gtt.  SIGNIFICANT EVENTS / STUDIES:  10/27  Admitted with hypertensive emergency and pulmonary edema  LINE / TUBES:  CULTURES:  ANTIBIOTICS:  HISTORY OF PRESENT ILLNESS:  47 yo with history of HTN, CAD (reported by patient, not in EMR) and Cocaine use (denies recent) brought to [facility] ED with progressive dyspnea since about 2 months ago.  She also reports orthopnea but no paroxysmal nocturnal dyspnea. She also reports periorbital and pedal edema.  She denies chest pain. There was no fever, chills, cough, sputum production or hemoptysis.  She is aware of being hypertensive, but has not been taking any medications for the last 2-3 years.  In ED CXR revealed cardiomegaly and pulmonary vascular congestion. She was treated with Lasix and Nitroglycerin gtt with significant improvement in her condition.  At the time of my assessment her BP is 170/100 off Nitroglycerin gtt and she is saturating 99% while breathing ambient air.  PAST MEDICAL HISTORY :  Past Medical History  Diagnosis Date  . Hypertension   . Anemia   . Arrhythmia    History reviewed. No pertinent past surgical history. Prior to Admission medications   Medication Sig Start Date End Date Taking? Authorizing Provider  albuterol (VENTOLIN HFA) 108 (90 BASE) MCG/ACT inhaler Inhale 2 puffs into the lungs 4 (four) times daily as needed for wheezing.    Yes Historical Provider, MD  diphenhydramine-acetaminophen (TYLENOL PM) 25-500 MG TABS Take 2 tablets by mouth at bedtime as needed (for pain).   Yes Historical  Provider, MD  ibuprofen (ADVIL,MOTRIN) 200 MG tablet Take 800 mg by mouth every 8 (eight) hours as needed for pain.   Yes Historical Provider, MD   No Known Allergies  FAMILY HISTORY:  History reviewed. No pertinent family history.  SOCIAL HISTORY:  reports that she has been smoking.  She does not have any smokeless tobacco history on file. She reports that she does not drink alcohol or use illicit drugs.  REVIEW OF SYSTEMS:   Constitutional: Negative for fever, chills, weight loss, malaise/fatigue and diaphoresis.  HENT: Negative for hearing loss, ear pain, nosebleeds, congestion, sore throat, neck pain, tinnitus and ear discharge.   Eyes: Negative for blurred vision, double vision, photophobia, pain, discharge and redness.  Respiratory: Negative for cough, hemoptysis, sputum production, wheezing and stridor.  Positive for dyspnea. Cardiovascular: Negative for chest pain, palpitations, claudication, leg swelling and PND. Positive for orthopnea. Gastrointestinal: Negative for heartburn, nausea, vomiting, abdominal pain, diarrhea, constipation, blood in stool and melena.  Genitourinary: Negative for dysuria, urgency, frequency, hematuria and flank pain. Positive for periorbital and pedal edema. Musculoskeletal: Negative for myalgias, back pain, joint pain and falls.  Skin: Negative for itching and rash.  Neurological: Negative for dizziness, tingling, tremors, sensory change, speech change, focal weakness, seizures, loss of consciousness, weakness and headaches.  Endo/Heme/Allergies: Negative for environmental allergies and polydipsia. Does not bruise/bleed easily.  SUBJECTIVE:   VITAL SIGNS: Temp:  [98.4 F (36.9 C)] 98.4 F (36.9 C) (10/27 1552) Pulse Rate:  [49-113] 94 (10/27 1910) Resp:  [20-30] 25 (10/27 1910) BP: (154-200)/(97-151) 157/101 mmHg (10/27  1910) SpO2:  [97 %-100 %] 100 % (10/27 1910)  PHYSICAL EXAMINATION: General:  Resting comfortably, appears to be in no acute  distress Neuro:  Awake, alert, cooperative with examination, non-focal HEENT:  NCAT, PERRL, moist membranes, periorbital edema vs anatomical variance Neck:  Soft, no bruits, no lymphadenopathy, no carotid btuits Cardiovascular:  Tachycardic, RRR, no m/r/g Lungs:  Bilateral air entry, few bibasilar rales Abdomen:  Soft, nontender, bowel sounds present, no organomegaly Musculoskeletal:  Moves all extremities, symmetrical bilateral pitting pedal edema Skin:  Intact, no rash  Recent Labs Lab 08/19/13 1754  NA 134*  K 4.3  CL 102  CO2 21  BUN 13  CREATININE 0.99  GLUCOSE 103*    Recent Labs Lab 08/19/13 1754  HGB 8.8*  HCT 33.5*  WBC 10.5  PLT 422*   CXR:  10/27  >>> Cardiomegaly and pulmonary vascular congestion EKG:  10/27 >>> Sinus tachycardia, low voltage, non-specific ST-T changes  ASSESSMENT / PLAN:  Hypertensive emergency, resolving Acute pulmonary edema, resolving 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 ) Acute ( on chronic?) heart failure ( Systolic? Diastolic?) Possible pericardial effusion Coronary artery disease Anemia Cocaine abuse  -->  Appears to be stable for SDU admission -->  Goal BP < 180/100 -->  Goal SpO2>92 -->  Supplemental oxygen PRN -->  Hydralazine scheduled and PRN +/- nitrate -->  Lasix -->  Avoid non-selective beta-blockers -->  Drug screen -->  Trend Troponin -->  TTE STAT to evaluate LVF and r/o pericardial effusion -->  May need Cardiology consult based on TTE results  -->  No indications for transfusion -->  PCCM will sign off, please reconsult PRN  I have personally obtained history, examined patient, evaluated and interpreted laboratory and imaging results, reviewed medical records, formulated assessment / plan and placed orders.  Lonia Farber, MD Pulmonary and Critical Care Medicine [facility] Pager: [phone]  08/19/2013, 7:23 PM

## 2013-08-19 NOTE — ED Notes (Signed)
Pt remains in CT at this time, will take to unit after pt comes back.

## 2013-08-19 NOTE — H&P (Signed)
Family Medicine Teaching [facility] Admission History and Physical Service Pager: [phone]  Patient name: Stacey Lin Medical record number: [id number] Date of birth: 29-Sep-1966 Age: 47 y.o. Gender: female  Primary Care Provider: No primary provider on file. Consultants: CCM Code Status: Full  Chief Complaint: shortness of breath  Assessment and Plan: Seher Shove is a 47 y.o. female presenting with progressive SOB/dyspnea, CP, and asymmetrical LE edema. PMH is significant for anemia, dCHF grade II, HTN, CAD, polysubstanceabuse, COPD?   # CV: h/o dCHF grade II, CAD, and HTN. Likely in sCHF on admission given LE edema, SOB, CP, and BNP >6500. Diuresis initiated in ED. Concern for pericaridal effusion given at times narrow pulse pressures, changing heart sounds, and possible rub on exam. No recent infections or autoimmune diseases but may be idiopathic if present. Condition exacerbated by anemia. HTN uncontrolled per pt. Will a avoid bblockers given likely "wet" CHF exacerbation and h/o cocaine use. Unsure of dry weight. Troponin x1 in ED neg - Admit to step down -  Echo w/ contrast (STAT CTA w/ rule out/in pericardial effusion) - UDS - Lasix 40 IV BID - Troponin x3 (first in ED neg) - EKG - Lipids, A1c - strict I/O - Daily wts - start Lisinopril 5 Qday(will monitor for renal impairment given ongoing diuresis) - start HCTZ 6.25 Qday - Hydralazine PRN SBP > 200 and diastolic > 120.  - CMET (monitor Cr and K) - start ASA 81  # Res: SOB likely from sCHF exacerbation but cannot r/o PE. No evidence for PNA. Asymmetrical LE swelling, tachycardia and pain w/ decreased ambulation an likely PVD concerning for DVT. H/o COPD but no sign of exacerbation as lungs w/o wheeze and no obvious effusion/consolidation on exam. No elevated WBC. Pt on RA but breathing through pursed lips in ED (auto PEEP), w/ nml O2 sats. - Monitor respiratory status closely as likely to improve w/  diuresis - CTA STAT  - If CTA neg will order LE dopplers - PFTs after discharge - ABG if deteriorates - O2 via [place]. Will escalate to CPAP vs BIPAP as needed - Tobacco cessation ( no nicotine, unless requested, as only smoking 1-2pd)  # Heme: Pt w/ microcytic anemia to 8.8 w/ baseline around 10. Likely mixed picture w/ anemia of chronic disease as well as iron deficiency and possible acute blood loss. Pt w/ known h/o bleeding uterine fibroids (s/p transfusion in 2011 and unable to have surgery due to heart issues). Pt reports taking iron in past but not recently. May be part of reason for tachycardia. No current plans to transfuse - Anemia panel - IV iron in am - FOBT - trend CBC  # FEN/GI: Tolerating PO - Heart healthy diet - SLIV - Miralax PRN  # Social: h/o substance abuse (pt claims only smoking crack). Clean since ~2012.  - UDS - ETOH  Prophylaxis:  - Hep [place] TID  Disposition: Pending clinical improvement  History of Present Illness: [name] is a 47 y.o. female presenting with shortness of breath. Progressive worsening for 2-3 mo. Sleeping sitting up in chair/couch. Wakes up at nitght. Best position is sitting up leaning over back of couch. Worse w/ even ambulation of about 10-20 feet that improves w/ rest. Associated nausea and w/ right sided to back pain. Denies any central CP, w/ radiation, palpitations, szr, syncope, fevers or recent infections. Still smoking about 1-2 cigarettes per day. Last drug use 2+ years ago.  LE swelling started 2 wks ago but R>L  by more than twice and w/ pain in R leg.    Review Of Systems: Per HPI with the following additions: none Otherwise 12 point review of systems was performed and was unremarkable.  Patient Active Problem List   Diagnosis Date Noted  . Hypertensive emergency 08/19/2013  . Congestive heart failure 08/19/2013  . Pulmonary edema cardiac cause 08/19/2013  . FIBROIDS, UTERUS 05/11/2010  . ANEMIA, SECONDARY TO  BLOOD LOSS 05/11/2010  . COPD 05/11/2010  . TOBACCO USER 07/07/2009  . ESSENTIAL HYPERTENSION, BENIGN 06/19/2009  . MENORRHAGIA 06/19/2009  . COCAINE ABUSE, HX OF 06/19/2009   Past Medical History: Past Medical History  Diagnosis Date  . Hypertension   . Anemia   . Arrhythmia    Past Surgical History: History reviewed. No pertinent past surgical history. Social History: History  Substance Use Topics  . Smoking status: Current Some Day Smoker  . Smokeless tobacco: Not on file  . Alcohol Use: No   Additional social history: none Please also refer to relevant sections of EMR.  Family History: History reviewed. No pertinent family history. Allergies and Medications: No Known Allergies No current facility-administered medications on file prior to encounter.   Current Outpatient Prescriptions on File Prior to Encounter  Medication Sig Dispense Refill  . albuterol (VENTOLIN HFA) 108 (90 BASE) MCG/ACT inhaler Inhale 2 puffs into the lungs 4 (four) times daily as needed for wheezing.         Objective: BP 176/110  Pulse 106  Temp(Src) 98.4 F (36.9 C) (Oral)  Resp 23  Ht 5' (1.524 m)  SpO2 98%  LMP 08/12/2013 Exam: General: Mod distress, WNWD HEENT: EOMI, MMM Cardiovascular: RRR, tachy, III/VI noisy and almost musical systolic murmur most pronounced at apex, changing strngth of cardiac sounds alternating between loud  and distant, 2+ pulses, no JVD, No clear rub.  Respiratory: crackles bilat at bases, breathing through pursed lips and in staccato sentences Abdomen:soft non-ttp, NABS Extremities: 2+ LE edema w/ R slightly worse than L Skin: intact, warm, well perfused Neuro: CN 2-12 grossly intact, moves all extremities spontaneously   Labs and Imaging: Results for orders placed during the hospital encounter of 08/19/13 (from the past 24 hour(s))  PRO B NATRIURETIC PEPTIDE     Status: Abnormal   Collection Time    08/19/13  5:54 PM      Result Value Range   Pro B  Natriuretic peptide (BNP) 6945.0 (*) 0 - 125 pg/mL  BASIC METABOLIC PANEL     Status: Abnormal   Collection Time    08/19/13  5:54 PM      Result Value Range   Sodium 134 (*) 135 - 145 mEq/L   Potassium 4.3  3.5 - 5.1 mEq/L   Chloride 102  96 - 112 mEq/L   CO2 21  19 - 32 mEq/L   Glucose, Bld 103 (*) 70 - 99 mg/dL   BUN 13  6 - 23 mg/dL   Creatinine, Ser 7.82  0.50 - 1.10 mg/dL   Calcium 8.5  8.4 - 95.6 mg/dL   GFR calc non Af Amer 66 (*) >90 mL/min   GFR calc Af Amer 76 (*) >90 mL/min  CBC     Status: Abnormal   Collection Time    08/19/13  5:54 PM      Result Value Range   WBC 10.5  4.0 - 10.5 K/uL   RBC 4.60  3.87 - 5.11 MIL/uL   Hemoglobin 8.8 (*) 12.0 - 15.0 g/dL  HCT 33.5 (*) 36.0 - 46.0 %   MCV 72.8 (*) 78.0 - 100.0 fL   MCH 19.1 (*) 26.0 - 34.0 pg   MCHC 26.3 (*) 30.0 - 36.0 g/dL   RDW 78.2 (*) 95.6 - 21.3 %   Platelets 422 (*) 150 - 400 K/uL  POCT I-STAT TROPONIN I     Status: None   Collection Time    08/19/13  6:09 PM      Result Value Range   Troponin i, poc 0.00  0.00 - 0.08 ng/mL   Comment 3            Dg Chest 2 View  08/19/2013   CLINICAL DATA:  Shortness of breath, cough.  EXAM: CHEST  2 VIEW  COMPARISON:  05/04/2010  FINDINGS: Cardiomegaly with vascular congestion. Probable compressive atelectasis in the left lower lobe. Otherwise, no confluent airspace opacities. No effusions. No acute bony abnormality.  IMPRESSION: Cardiomegaly, vascular congestion. Probable compressive atelectasis in the left lower lobe.   Electronically Signed   By: Charlett Nose M.D.   On: 08/19/2013 16:53    Ozella Rocks, MD 08/19/2013, 9:11 PM PGY-3, [facility] FPTS Intern pager: [phone], text pages welcome

## 2013-08-19 NOTE — ED Provider Notes (Addendum)
CSN: [id number]     Arrival date & time 08/19/13  1547 History   First MD Initiated Contact with Patient 08/19/13 1737    Level V caveat unstable vital signs patient in respiratory distress Chief Complaint  Patient presents with  . Shortness of Breath   (Consider location/radiation/quality/duration/timing/severity/associated sxs/prior Treatment) HPI Complains of shortness of breath progressively worsening for approximately 2 months. Worse with lying supine and improved with sitting up also worse with exertion she denies chest pain she denies pain anywhere except for low nonradiating back pain where she slipped while walking down steps today. No treatment prior to coming here. Treated with albuterol prior to my exam with minimal relief Past Medical History  Diagnosis Date  . Hypertension   . Anemia   . Arrhythmia    coronary artery disease MI History reviewed. No pertinent past surgical history.   surgical history tubal ligation cesarean section fibroid tumors History reviewed. No pertinent family history. History  Substance Use Topics  . Smoking status: Current Some Day Smoker  . Smokeless tobacco: Not on file  . Alcohol Use: No   former user of cocaine, last used cocaine 4 years ago no history of IV drug use OB History   Grav Para Term Preterm Abortions TAB SAB Ect Mult Living                 Review of Systems  Respiratory: Positive for shortness of breath.   Genitourinary:       Irregular menses  Musculoskeletal: Positive for back pain.    Allergies  Review of patient's allergies indicates no known allergies.  Home Medications   Current Outpatient Rx  Name  Route  Sig  Dispense  Refill  . albuterol (VENTOLIN HFA) 108 (90 BASE) MCG/ACT inhaler   Inhalation   Inhale 2 puffs into the lungs 4 (four) times daily as needed.            BP 179/113  Pulse 113  Temp(Src) 98.4 F (36.9 C) (Oral)  Resp 26  Ht 5' (1.524 m)  SpO2 98%  LMP 08/12/2013 Physical Exam   Nursing note and vitals reviewed. Constitutional: She appears well-developed and well-nourished. She appears distressed.  Moderate to severe respiratory distress speaks in short sentences  HENT:  Head: Normocephalic and atraumatic.  Eyes: Conjunctivae are normal. Pupils are equal, round, and reactive to light.  Neck: Neck supple. JVD present. No tracheal deviation present. No thyromegaly present.  Cardiovascular: Normal rate and regular rhythm.   No murmur heard. Pulmonary/Chest: She is in respiratory distress. She has rales.  Rales halfway up bilaterally  Abdominal: Soft. Bowel sounds are normal. She exhibits no distension. There is no tenderness.  Musculoskeletal: Normal range of motion. She exhibits edema. She exhibits no tenderness.  2+ pretibial pitting edema bilaterally  Neurological: She is alert. Coordination normal.  Skin: Skin is warm and dry. No rash noted.  Psychiatric: She has a normal mood and affect.    ED Course  Procedures (including critical care time) Labs Review Labs Reviewed  PRO B NATRIURETIC PEPTIDE  BASIC METABOLIC PANEL  CBC   Imaging Review Dg Chest 2 View  08/19/2013   CLINICAL DATA:  Shortness of breath, cough.  EXAM: CHEST  2 VIEW  COMPARISON:  05/04/2010  FINDINGS: Cardiomegaly with vascular congestion. Probable compressive atelectasis in the left lower lobe. Otherwise, no confluent airspace opacities. No effusions. No acute bony abnormality.  IMPRESSION: Cardiomegaly, vascular congestion. Probable compressive atelectasis in the left lower lobe.  Electronically Signed   By: Charlett Nose M.D.   On: 08/19/2013 16:53    EKG Interpretation     Ventricular Rate:  113 PR Interval:  156 QRS Duration: 82 QT Interval:  361 QTC Calculation: 495 R Axis:   75 Text Interpretation:  Sinus tachycardia Left atrial enlargement Low voltage, extremity and precordial leads Consider anterior infarct No significant change since last tracing           6:50  PM patient feels improved after treatment with intravenous nitroglycerin and intravenous Lasix. She still appears in moderate respiratory distress. Chest x-ray rviewed by me Old records reviewed by me Results for orders placed during the hospital encounter of 08/19/13  PRO B NATRIURETIC PEPTIDE      Result Value Range   Pro B Natriuretic peptide (BNP) 6945.0 (*) 0 - 125 pg/mL  BASIC METABOLIC PANEL      Result Value Range   Sodium 134 (*) 135 - 145 mEq/L   Potassium 4.3  3.5 - 5.1 mEq/L   Chloride 102  96 - 112 mEq/L   CO2 21  19 - 32 mEq/L   Glucose, Bld 103 (*) 70 - 99 mg/dL   BUN 13  6 - 23 mg/dL   Creatinine, Ser 1.61  0.50 - 1.10 mg/dL   Calcium 8.5  8.4 - 09.6 mg/dL   GFR calc non Af Amer 66 (*) >90 mL/min   GFR calc Af Amer 76 (*) >90 mL/min  CBC      Result Value Range   WBC 10.5  4.0 - 10.5 K/uL   RBC 4.60  3.87 - 5.11 MIL/uL   Hemoglobin 8.8 (*) 12.0 - 15.0 g/dL   HCT 04.5 (*) 40.9 - 81.1 %   MCV 72.8 (*) 78.0 - 100.0 fL   MCH 19.1 (*) 26.0 - 34.0 pg   MCHC 26.3 (*) 30.0 - 36.0 g/dL   RDW 91.4 (*) 78.2 - 95.6 %   Platelets 422 (*) 150 - 400 K/uL  POCT I-STAT TROPONIN I      Result Value Range   Troponin i, poc 0.00  0.00 - 0.08 ng/mL   Comment 3            Dg Chest 2 View  08/19/2013   CLINICAL DATA:  Shortness of breath, cough.  EXAM: CHEST  2 VIEW  COMPARISON:  05/04/2010  FINDINGS: Cardiomegaly with vascular congestion. Probable compressive atelectasis in the left lower lobe. Otherwise, no confluent airspace opacities. No effusions. No acute bony abnormality.  IMPRESSION: Cardiomegaly, vascular congestion. Probable compressive atelectasis in the left lower lobe.   Electronically Signed   By: Charlett Nose M.D.   On: 08/19/2013 16:53   nitg intravenous drip ordered to titrate to dyspnea or SBP 120  MDM  No diagnosis found. A consult to the intensive care unit team for admission Diagnosis #1 pulmonary edema #2 hypertensive emergency #3 anemia CRITICAL  CARE Performed by: Doug Sou Total critical care time: 30 minute Critical care time was exclusive of separately billable procedures and treating other patients. Critical care was necessary to treat or prevent imminent or life-threatening deterioration. Critical care was time spent personally by me on the following activities: development of treatment plan with patient and/or surrogate as well as nursing, discussions with consultants, evaluation of patient's response to treatment, examination of patient, obtaining history from patient or surrogate, ordering and performing treatments and interventions, ordering and review of laboratory studies, ordering and review of radiographic studies, pulse oximetry and  re-evaluation of patient's condition.  Doug Sou, MD 08/19/13 1902  Addendum intensivist evaluate patient in ED and deemed that she could be admitted to step down unit. I called the family practice resident physician Dr.Merell arrange for admission to step down unit  Doug Sou, MD 08/19/13 1933

## 2013-08-19 NOTE — ED Notes (Signed)
Admitting MD paged regarding Nitroglycerin Order.

## 2013-08-19 NOTE — Progress Notes (Signed)
Pt transferred from ED via stretcher with RN, pt ax4, able to move all ext. VS in epic, No reports of pain. Will continue to monitor.

## 2013-08-20 DIAGNOSIS — D259 Leiomyoma of uterus, unspecified: Secondary | ICD-10-CM

## 2013-08-20 DIAGNOSIS — D500 Iron deficiency anemia secondary to blood loss (chronic): Secondary | ICD-10-CM

## 2013-08-20 DIAGNOSIS — I369 Nonrheumatic tricuspid valve disorder, unspecified: Secondary | ICD-10-CM

## 2013-08-20 LAB — BASIC METABOLIC PANEL
BUN: 13 mg/dL (ref 6–23)
Calcium: 9.3 mg/dL (ref 8.4–10.5)
Creatinine, Ser: 1.13 mg/dL — ABNORMAL HIGH (ref 0.50–1.10)
GFR calc Af Amer: 65 mL/min — ABNORMAL LOW (ref >90)
GFR calc non Af Amer: 56 mL/min — ABNORMAL LOW (ref >90)
Glucose, Bld: 113 mg/dL — ABNORMAL HIGH (ref 70–99)

## 2013-08-20 LAB — LIPID PANEL
HDL: 40 mg/dL (ref >39)
LDL Cholesterol: 84 mg/dL (ref 0–99)
Triglycerides: 97 mg/dL (ref <150)

## 2013-08-20 LAB — HEMOGLOBIN A1C
Hgb A1c MFr Bld: 5.7 % — ABNORMAL HIGH (ref <5.7)
Mean Plasma Glucose: 117 mg/dL — ABNORMAL HIGH (ref <117)

## 2013-08-20 LAB — MRSA PCR SCREENING: MRSA by PCR: NEGATIVE

## 2013-08-20 LAB — MAGNESIUM: Magnesium: 1.8 mg/dL (ref 1.5–2.5)

## 2013-08-20 LAB — TROPONIN I: Troponin I: <0.3 ng/mL (ref <0.30)

## 2013-08-20 LAB — FOLATE: Folate: 19.7 ng/mL

## 2013-08-20 LAB — IRON AND TIBC
Saturation Ratios: 4 % — ABNORMAL LOW (ref 20–55)
UIBC: 418 ug/dL — ABNORMAL HIGH (ref 125–400)

## 2013-08-20 MED ORDER — IOHEXOL 300 MG/ML  SOLN
25.0000 mL | INTRAMUSCULAR | Status: AC
  Administered 2013-08-20: 25 mL via ORAL

## 2013-08-20 MED ORDER — POTASSIUM CHLORIDE CRYS ER 20 MEQ PO TBCR
40.0000 meq | EXTENDED_RELEASE_TABLET | Freq: Two times a day (BID) | ORAL | Status: DC
  Filled 2013-08-20 (×2): qty 2

## 2013-08-20 MED ORDER — POTASSIUM CHLORIDE 10 MEQ/100ML IV SOLN
10.0000 meq | INTRAVENOUS | Status: DC
  Administered 2013-08-20 (×2): 10 meq via INTRAVENOUS
  Filled 2013-08-20: qty 100

## 2013-08-20 MED ORDER — POTASSIUM CHLORIDE CRYS ER 10 MEQ PO TBCR
30.0000 meq | EXTENDED_RELEASE_TABLET | Freq: Two times a day (BID) | ORAL | Status: DC
  Administered 2013-08-20 – 2013-08-23 (×8): 30 meq via ORAL
  Filled 2013-08-20 (×10): qty 3

## 2013-08-20 NOTE — Progress Notes (Signed)
FMTS Attending Daily Note: Stacey Levy MD [phone] pager office [phone] I  have seen and examined this patient, reviewed their chart. I have discussed this patient with the resident. I agree with the resident's findings, assessment and care plan. Her CT of chest shows some very abnormal appearing lungs, not consistent with generic COPD. Looks almost like pulmonary fibrosis, or perhaps (given her history of cocaine) crack lung. I suspect this is part of what is causing her SOB as her CHF does not seem hat profound. Agree with new ECHO but will also get pulmonology consult.

## 2013-08-20 NOTE — Progress Notes (Signed)
*  Preliminary Results* Bilateral lower extremity venous duplex completed. Bilateral lower extremities are negative for deep vein thrombosis. There is no evidence of Baker's cyst bilaterally.  08/20/2013  Gertie Fey, RVT, RDCS, RDMS

## 2013-08-20 NOTE — Progress Notes (Signed)
Family Medicine Teaching Service Daily Progress Note Intern Pager: [phone]  Patient name: Stacey Lin Medical record number: [id number] Date of birth: 1966-06-12 Age: 47 y.o. Gender: female  Primary Care Provider: No primary provider on file. Consultants: CCM Code Status: Full   Pt Overview and Major Events to Date:   Assessment and Plan: Cassandr Hauri is a 47 y.o. female presenting with progressive SOB/dyspnea, CP, and asymmetrical LE edema. PMH is significant for anemia, dCHF grade II, HTN, CAD, polysubstanceabuse, COPD?   #H/o dCHF grade II with new onset sCHF: Unsure of dry weight.Will a avoid bblockers given likely "wet" CHF exacerbation and h/o cocaine use. Down 3.5 L since being diuresis and able to lay flat  - f/u ECHO  - BNP >6500 - Lasix 40 mg BID  -  ASA 81  - CTA: negative for PE  - f/u I/O's   #CAD: Stable  - Troponin neg x 2  - EKG: NSR  - Risk stratification: moderate-intensity statin.  - f/u Hgb A1c   #HTN: Blood pressure stable as of this morning.  - Lisinopril 5 Qday (will monitor for renal impairment given ongoing diuresis) - Hydralazine PRN SBP > 200 and diastolic > 120 - HCTZ 6.25 Qday  - spironolactone could be started due to low potassium due to the lasix.   #hypokalemia: Will continue to monitor and replete. Not tolerating IV K.  - 30 mEq tablets x 2  -  Mg 1.8  - F/u BMET at 5 pm   #Right lower leg edema: h/o of edema worse in right than left. H/o of fibroids and enlarged uterus.  - CT: ab and pelvis   # Res: spoke with Pulmonology and they believe it is all attributed to her new onset sCHF. Signed off with no further rec's.    - Monitor respiratory status closely as likely to improve w/ diuresis - CT: neg  - If CTA neg will order LE dopplers  - PFTs after discharge  - O2 via [place]. Will escalate to CPAP vs BIPAP as needed  - Tobacco cessation ( no nicotine, unless requested, as only smoking 1-2pd)   # Microcytic anemia  (mixed: ACD vs Iron Def) : 8.8 w/ baseline around 10. No current plans to transfuse  - Anemia panel  - IV iron in am  - FOBT: pending  - Hgb trending up.   # FEN/GI: Tolerating PO  - Heart healthy diet  - SLIV  - Miralax PRN   # Social: h/o substance abuse (pt claims only smoking crack). Clean since ~2012.  - UDS: neg  - ETOH: <11  #Prophylaxis:  - Hep [place] TID  Disposition: pending improvement of shortness of breath and diuresis.  Subjective: patient is feeling much better today. She has improved breathing and the edema in her legs has gone down.   Objective: Temp:  [97.6 F (36.4 C)-98.4 F (36.9 C)] 97.7 F (36.5 C) (10/28 1216) Pulse Rate:  [49-113] 105 (10/28 0800) Resp:  [19-30] 24 (10/28 0800) BP: (119-200)/(79-151) 132/79 mmHg (10/28 0800) SpO2:  [97 %-100 %] 98 % (10/28 0800) Weight:  [166 lb 7.2 oz (75.5 kg)-167 lb 1.7 oz (75.8 kg)] 167 lb 1.7 oz (75.8 kg) (10/28 0500)  Filed Weights   08/19/13 2201 08/20/13 0500  Weight: 166 lb 7.2 oz (75.5 kg) 167 lb 1.7 oz (75.8 kg)    Physical Exam: General: Mod distress, WNWD  HEENT: EOMI, MMM  Cardiovascular: RRR, tachy, III/VI noisy and almost musical systolic murmur most  pronounced at apex, changing strngth of cardiac sounds alternating between loud and distant, 2+ pulses, no JVD, No clear rub.  Respiratory: crackles bilat at bases, normal work of breathing.  Abdomen:soft non-ttp, NABS  Extremities: 2+ LE edema w/ R slightly worse than L  Skin: intact, warm, well perfused    Laboratory:  Recent Labs Lab 08/19/13 1754 08/19/13 2235  WBC 10.5 9.2  HGB 8.8* 9.2*  HCT 33.5* 33.2*  PLT 422* 415*    Recent Labs Lab 08/19/13 1754 08/19/13 2235  NA 134* 136  K 4.3 2.9*  CL 102 100  CO2 21 27  BUN 13 12  CREATININE 0.99 0.97  CALCIUM 8.5 8.6  PROT  --  6.1  BILITOT  --  0.7  ALKPHOS  --  89  ALT  --  20  AST  --  21  GLUCOSE 103* 132*      Recent Labs Lab 08/19/13 2108 08/20/13 0505  08/20/13 0905  TROPONINI <0.30 <0.30 <0.30     Imaging/Diagnostic Tests: Dg Chest 2 View  08/19/2013   IMPRESSION: Cardiomegaly, vascular congestion. Probable compressive atelectasis in the left lower lobe.   CTA 10/17 IMPRESSION:  No evidence of pulmonary embolism.  Mildly enlarged central pulmonary arteries.  Enlarged heart with small pericardial and right pleural effusions.  Mild atelectasis at both lung bases with scattered areas of mosaic  attenuation throughout both lungs greater on right, nonspecific;  this could reflect small airways disease, active alveolitis, edema,  or pulmonary hypertension.   Clare Gandy, MD 08/20/2013, 12:32 PM PGY-1, [facility] FPTS Intern pager: [phone], text pages welcome

## 2013-08-20 NOTE — Progress Notes (Signed)
Utilization review completed.  

## 2013-08-20 NOTE — Progress Notes (Signed)
  Echocardiogram 2D Echocardiogram has been performed.  Stacey Lin FRANCES 08/20/2013, 4:41 PM

## 2013-08-20 NOTE — H&P (Signed)
Family Medicine Teaching Service Attending Note  I interviewed and examined patient Stacey Lin and reviewed their tests and x-rays.  I discussed with Dr. Konrad Dolores and reviewed their note for today.  I agree with their assessment and plan.     Additionally  Feeling better this AM Able to lie flat Good diuresis No evidence of pulmonary emboli  Recommend  Replace K orally  Has history of fibroids - these maybe causing some venous obstruction complicating her lower extrem edema  Await echo - likely will need cardiology consult

## 2013-08-21 ENCOUNTER — Inpatient Hospital Stay (HOSPITAL_COMMUNITY): Payer: Medicaid Other

## 2013-08-21 DIAGNOSIS — I10 Essential (primary) hypertension: Secondary | ICD-10-CM

## 2013-08-21 DIAGNOSIS — F17200 Nicotine dependence, unspecified, uncomplicated: Secondary | ICD-10-CM

## 2013-08-21 LAB — BASIC METABOLIC PANEL
BUN: 11 mg/dL (ref 6–23)
Calcium: 9.5 mg/dL (ref 8.4–10.5)
Creatinine, Ser: 1.17 mg/dL — ABNORMAL HIGH (ref 0.50–1.10)
GFR calc Af Amer: 62 mL/min — ABNORMAL LOW (ref >90)
GFR calc non Af Amer: 54 mL/min — ABNORMAL LOW (ref >90)

## 2013-08-21 LAB — TROPONIN I: Troponin I: <0.3 ng/mL (ref <0.30)

## 2013-08-21 MED ORDER — NITROGLYCERIN 0.4 MG SL SUBL
SUBLINGUAL_TABLET | SUBLINGUAL | Status: AC
  Filled 2013-08-21: qty 25

## 2013-08-21 MED ORDER — IOHEXOL 300 MG/ML  SOLN
80.0000 mL | Freq: Once | INTRAMUSCULAR | Status: AC | PRN
  Administered 2013-08-21: 80 mL via INTRAVENOUS

## 2013-08-21 MED ORDER — SODIUM CHLORIDE 0.9 % IV SOLN
1020.0000 mg | Freq: Once | INTRAVENOUS | Status: AC
  Administered 2013-08-21: 1020 mg via INTRAVENOUS
  Filled 2013-08-21: qty 34

## 2013-08-21 MED ORDER — IOHEXOL 300 MG/ML  SOLN
25.0000 mL | INTRAMUSCULAR | Status: AC
  Administered 2013-08-21 (×2): 25 mL via ORAL

## 2013-08-21 MED ORDER — MORPHINE SULFATE 2 MG/ML IJ SOLN
2.0000 mg | Freq: Once | INTRAMUSCULAR | Status: AC
  Administered 2013-08-21: 2 mg via INTRAVENOUS
  Filled 2013-08-21: qty 1

## 2013-08-21 MED ORDER — OXYCODONE HCL 5 MG PO TABS
5.0000 mg | ORAL_TABLET | ORAL | Status: DC | PRN
  Administered 2013-08-21 – 2013-08-28 (×12): 5 mg via ORAL
  Filled 2013-08-21 (×12): qty 1

## 2013-08-21 NOTE — Discharge Summary (Signed)
Family Medicine Teaching [facility] Discharge Summary  Patient name: Stacey Lin Medical record number: [id number] Date of birth: Dec 28, 1965 Age: 47 y.o. Gender: female Date of Admission: 08/19/2013  Date of Discharge: 08/29/2013 Admitting Physician: Nestor Ramp, MD  Primary Care Provider: No primary provider on file. Consultants: CHF team,   Indication for Hospitalization: shortness of breath   Discharge Diagnoses/Problem List:  1. Acute systolic congestive heart failure 2. Diastolic heart failure 3. Coronary artery disease 4. Hypertension 5. Hypokalemia 6. AKI 7. Uterine fibroids  Disposition: Discharge home  Discharge Condition: Improved and stable  Discharge Exam:  General: No acute distress, alert and oriented x4, laying in bed  Cardiovascular: S1S2, RRR, 3/6 systolic murmur  Respiratory: normal work of breathing, clear to auscultation bilaterally, no wheezing or crackles  Abdomen: soft, tender, multiple masses felt  Extremities: no pitting edema, anterior lower leg tenderness bilaterally  Brief Hospital Course:  Stacey Lin is a 47 y.o. female presenting with progressive SOB/dyspnea, CP, and asymmetrical LE edema. PMH is significant for anemia, dCHF grade II, HTN, CAD, polysubstanceabuse, COPD.   #H/o dCHF grade II with new onset sCHF: Patient is short of breath, CP, and BNP >6500. Diuresis was initiated in ED. CTA was negative for PE.  Concern for pericaridal effusion given at times narrow pulse pressures, changing heart sounds, and possible rub on exam. No recent infections or autoimmune diseases but may be idiopathic if present.  Unsure of dry weight but she had an admission weight of 166lbs. Her ECHO revealed 30-35% EF with moderate regurgitation. Cardiac MRI no scarring. 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 She had good diuresis on IV lasix and was eventually transitioned to PO lasix. Good and adequate diuresis continued. At  discharge, she had a total output of about 18L and a weight of 144lbs. Immediately prior to discharge, EKG revealed prolonged PR. Digoxin discontinued and digoxin level was within normal limits at 1. Heart failure team recommended restarting Coreg 3.125 mg BID, lisinopril, spironolactone and lasix for discharge   #CAD: Initially complaining of chest pain off an on at home on admission. Troponins on multiple occasions all neg. EKG was unchanged from previous. Her Hgb A1c 5.7 but very hypertensive and a smoker. Atorvastatin 40 mg daily was initiated and tolerated it well.   #HTN: Patient was hypertensive upon admission with no antihypertensive mediations prior to admission. Blood pressure remained eventually lessened to normal limits after starting medication regimen including lisinopril and carvedilol.  #Hypokalemia: patient was found to have an initial K of 2.9. She didn't have any changes noticed on EKG or on telemetry. Mg was normal at 1.8.  She didn't tolerate IV K so PO K was used as supplementation. It was monitored in the setting of her being diuresed. Repeat BMP showed resolved hypokalemia with a potassium of 4.6  #AKI: creatinine was high at 1.75. Diuretics held and creatinine trended down to 1.03. Diuretics were restarted at that point.  #Right lower leg edema: She has a history of edema worse in right than left. History of fibroids and enlarged uterus. A CT Abd/plv showed that there is marked enlargement of the uterus, which is suggestive of multifocal fibroids. Edema eventually resolved by discharge.  # Res: She was short of breath upon presentation which was attributed to her new onset systolic CHF.  This is possibly compounded by a COPD component as well as her history of tobacco and crack use. CTA was negative as well as a negative venous duplex.  There was a question that she may have "crack lung" but upon asking pulmonology's opinion, they attributed all of her shortness of breath to her  CHF.  Her shortness of breath continued to improve upon being diuresed but always required a nighttime requirement of at least 1L O2 [place] due to desaturations down into the 70's. Her O2 requirement eventually ceased as her CHF status improved.  # Microcytic anemia (mixed: ACD vs Iron Def) : She was found to be anemic with a Hgb of 8.8 w/ baseline around 10.  Anemia panel revealed that she was iron deficient. She was treated with two does of IV iron. Has a history of uterine fibroids and irregular bleeding. She was stable and not lightheaded before discharge.  # Uterine fibroids: not managed in the hospital but source of pain for the patient.  Issues for Follow Up:  1. New onset sCHF: follow up in HF clinic and ability to pay for medications  2. Uterine fibroids causing a lot of pain and bleeding. Recommend follow-up with OBGYN for possible surgical treatment 3. Monitor potassium since was very hypokalemic during admission 4. Follow-up creatinine   Significant Procedures:  1. Cardiac MRI  Significant Labs and Imaging:   Recent Labs Lab 08/25/13 2044 08/26/13 0355 08/27/13 0420  WBC 11.0* 11.7* 9.2  HGB 10.8* 11.1* 10.3*  HCT 38.2 39.0 36.6  PLT 425* 388 368    Recent Labs Lab 08/24/13 0515 08/25/13 0445 08/26/13 0355 08/27/13 0420 08/27/13 1720 08/28/13 0454  NA 136 136 138 136  --  137  K 5.0 4.0 3.9 4.5  --  4.6  CL 98 97 98 99  --  103  CO2 30 32 31 26  --  26  GLUCOSE 107* 108* 151* 170*  --  88  BUN 16 20 25* 25*  --  18  CREATININE 1.28* 1.23* 1.75* 1.15*  --  1.03  CALCIUM 9.3 9.5 9.5 9.3  --  9.5  MG 2.2  --   --   --  2.3  --     ECHO 08/20/13: EF 30-35%, global HK, LVIDd 62 mm, moderate mitral regurg with rheumatic appearing MV, RA and LA severely dilated, mod/severe TR, peak PA 44  PORTABLE CHEST - 1 VIEW  10/29  IMPRESSION:  Cardiopericardial enlargement (including pericardial effusion based  on recent CT) with mild pulmonary edema.   CT Abdomen and  pelvis with contrast  IMPRESSION:  1. No acute findings in the abdomen or pelvis.  2. However, there is marked enlargement of the uterus, which is  suggestive of multifocal fibroids.  3. Diffuse body wall edema.  4. Moderate cardiomegaly.  5. Additional incidental findings, as above.   Bilateral lower extremity venous duplex  10/28  Bilateral lower extremities are negative for deep vein thrombosis. There is no evidence of Baker's cyst bilaterally.   Dg Chest 2 View  08/19/2013  IMPRESSION: Cardiomegaly, vascular congestion. Probable compressive atelectasis in the left lower lobe.   CTA 10/17  IMPRESSION:  No evidence of pulmonary embolism.  Mildly enlarged central pulmonary arteries.  Enlarged heart with small pericardial and right pleural effusions.  Mild atelectasis at both lung bases with scattered areas of mosaic  attenuation throughout both lungs greater on right, nonspecific;  this could reflect small airways disease, active alveolitis, edema,  or pulmonary hypertension.  Cardiac MRI (08/27/2013) IMPRESSION:  1. Moderately dilated LV with mild concentric LVH and moderate to  severe LV dysfunction (LVEF 32%).  2. Mildly dilated  RV with moderate RV dysfunction (RVEF 25%). RV  volume overload.  3. No late gadolinium enhancement suggestive of prior scar or an  infiltrative disease.  4. Moderate left atrial and severe right atrial dilatation.  5. Moderate AI. Moderate MR, predominantly functional. Moderate to  severe TR.   Results/Tests Pending at Time of Discharge: None  Discharge Medications:    Medication List         atorvastatin 40 MG tablet  Commonly known as:  LIPITOR  Take 1 tablet (40 mg total) by mouth daily at 6 PM.     carvedilol 3.125 MG tablet  Commonly known as:  COREG  Take 1 tablet (3.125 mg total) by mouth 2 (two) times daily with a meal.     diphenhydramine-acetaminophen 25-500 MG Tabs  Commonly known as:  TYLENOL PM  Take 2 tablets by  mouth at bedtime as needed (for pain).     furosemide 40 MG tablet  Commonly known as:  LASIX  Take 1 tablet (40 mg total) by mouth daily.     ibuprofen 200 MG tablet  Commonly known as:  ADVIL,MOTRIN  Take 2 tablets (400 mg total) by mouth every 6 (six) hours as needed for mild pain.     lisinopril 5 MG tablet  Commonly known as:  PRINIVIL,ZESTRIL  Take 1 tablet (5 mg total) by mouth daily.     spironolactone 12.5 mg Tabs tablet  Commonly known as:  ALDACTONE  Take 0.5 tablets (12.5 mg total) by mouth daily.     VENTOLIN HFA 108 (90 BASE) MCG/ACT inhaler  Generic drug:  albuterol  Inhale 2 puffs into the lungs 4 (four) times daily as needed for wheezing.        Discharge Instructions: Please refer to Patient Instructions section of EMR for full details.  Patient was counseled important signs and symptoms that should prompt return to medical care, changes in medications, dietary instructions, activity restrictions, and follow up appointments.   Follow-Up Appointments: Follow-up Information   Follow up with Arvilla Meres, MD On 09/02/2013. (at 11:15)    Specialty:  Cardiology   Contact information:   [address] [place] [address] [phone]       Follow up with [facility]    . Schedule an appointment as soon as possible for a visit in 3 days.   Contact information:   [address] [place] [address] [phone]      Jacquelin Hawking, MD 08/28/2013, 7:25 PM PGY-1, [facility] Family Medicine

## 2013-08-21 NOTE — Progress Notes (Signed)
FMTS Attending Daily Note: Rickelle Sylvestre MD [phone] pager office [phone] I  have seen and examined this patient, reviewed their chart. I have discussed this patient with the resident. I agree with the resident's findings, assessment and care plan. 

## 2013-08-21 NOTE — Progress Notes (Signed)
Family Medicine Teaching Service Daily Progress Note Intern Pager: [phone]  Patient name: Stacey Lin Medical record number: [id number] Date of birth: 04/02/1966 Age: 47 y.o. Gender: female  Primary Care Provider: No primary provider on file. Consultants: CCM Code Status: Full   Pt Overview and Major Events to Date:   Assessment and Plan: Zikia Liming is a 47 y.o. female presenting with progressive SOB/dyspnea, CP, and asymmetrical LE edema. PMH is significant for anemia, dCHF grade II, HTN, CAD, polysubstanceabuse, COPD?   #H/o dCHF grade II with new onset sCHF: Unsure of dry weight.  Will a avoid bblockers given likely "wet" CHF exacerbation and h/o cocaine use. Continues to diurese. Short of breath over night and on 1L [place]. O2 sats dropped to the 70's. May need a new O2 requirement.  - ECHO: LV EF 30-35%, systolic function was moderately to severely reduced.  - BNP >6500 - Lasix 40 mg BID  -  ASA 81  - CTA: negative for PE  - f/u I/O's   #CAD: complained of chest pain overnight.  - Troponin neg x 1, f/u trop  - 10/29 EKG: unchanged from previous   - Risk stratification: moderate-intensity statin.  - Hgb A1c 5.7  #HTN: Blood pressure stable as of this morning.  - Lisinopril 5 Qday (will monitor for renal impairment given ongoing diuresis) - Hydralazine PRN SBP > 200 and diastolic > 120 - HCTZ 6.25 Qday  - spironolactone could be started due to low potassium due to the lasix.   #hypokalemia: Will continue to monitor and replete. Not tolerating IV K.  - 30 mEq tablets x 2  -  Mg 1.8   #Right lower leg edema: h/o of edema worse in right than left. H/o of fibroids and enlarged uterus.  - CT: ab and pelvis   # Res: spoke with Pulmonology and they believe it is all attributed to her new onset sCHF. Signed off with no further rec's.    - Monitor respiratory status closely as likely to improve w/ diuresis - CT: neg  - LE dopplers: neg.   - PFTs after  discharge  - O2 via [place]. Will escalate to CPAP vs BIPAP as needed  - Tobacco cessation ( no nicotine, unless requested, as only smoking 1-2pd)   # Microcytic anemia (mixed: ACD vs Iron Def) : 8.8 w/ baseline around 10. Received IV iron once. Will administer once more.  - Anemia panel: Iron 16 low, Saturation 4% low, UIBC 418% high   - Ferritin 6 low  - reticulocytes: wnl  - FOBT: pending  - Hgb trending up.   # FEN/GI: Tolerating PO  - Heart healthy diet  - SLIV  - Miralax PRN   # Social: h/o substance abuse (pt claims only smoking crack). Clean since ~2012.  - UDS: neg  - ETOH: <11  #Prophylaxis:  - Hep [place] TID  Disposition: pending improvement of shortness of breath and diuresis.  Subjective: She had no complaints as of this morning. She is requiring 1L [place]. She was breathing faster than normal last night and she thinks it may be due to her being anxious.   Objective: Temp:  [97.7 F (36.5 C)-98.4 F (36.9 C)] 98.3 F (36.8 C) (10/29 0315) Pulse Rate:  [87-105] 87 (10/29 0330) Resp:  [11-38] 11 (10/29 0330) BP: (116-149)/(69-97) 139/89 mmHg (10/29 0315) SpO2:  [95 %-100 %] 100 % (10/29 0330) Weight:  [158 lb 11.7 oz (72 kg)] 158 lb 11.7 oz (72 kg) (10/29 0500)  Filed Weights   08/19/13 2201 08/20/13 0500 08/21/13 0500  Weight: 166 lb 7.2 oz (75.5 kg) 167 lb 1.7 oz (75.8 kg) 158 lb 11.7 oz (72 kg)    Physical Exam: General: Mod distress, WNWD  Cardiovascular: RRR, tachy, III/VI noisy and almost musical systolic murmur most pronounced at apex, changing strngth of cardiac sounds alternating between loud and distant, 2+ pulses, no JVD, No clear rub.  Respiratory: CTAB, normal work of breathing.  Extremities: 2+ LE edema w/ R slightly worse than L  Skin: intact, warm, well perfused    Laboratory:  Recent Labs Lab 08/19/13 1754 08/19/13 2235  WBC 10.5 9.2  HGB 8.8* 9.2*  HCT 33.5* 33.2*  PLT 422* 415*    Recent Labs Lab 08/19/13 1754 08/19/13 2235  08/20/13 1935  NA 134* 136 141  K 4.3 2.9* 3.7  CL 102 100 99  CO2 21 27 32  BUN 13 12 13   CREATININE 0.99 0.97 1.13*  CALCIUM 8.5 8.6 9.3  PROT  --  6.1  --   BILITOT  --  0.7  --   ALKPHOS  --  89  --   ALT  --  20  --   AST  --  21  --   GLUCOSE 103* 132* 113*      Recent Labs Lab 08/19/13 2108 08/20/13 0505 08/20/13 0905 08/21/13 0610  TROPONINI <0.30 <0.30 <0.30 <0.30     Imaging/Diagnostic Tests: PORTABLE CHEST - 1 VIEW 10/29 IMPRESSION:  Cardiopericardial enlargement (including pericardial effusion based  on recent CT) with mild pulmonary edema.  Bilateral lower extremity venous duplex 10/28  Bilateral lower extremities are negative for deep vein thrombosis. There is no evidence of Baker's cyst bilaterally.  Dg Chest 2 View  08/19/2013   IMPRESSION: Cardiomegaly, vascular congestion. Probable compressive atelectasis in the left lower lobe.   CTA 10/17 IMPRESSION:  No evidence of pulmonary embolism.  Mildly enlarged central pulmonary arteries.  Enlarged heart with small pericardial and right pleural effusions.  Mild atelectasis at both lung bases with scattered areas of mosaic  attenuation throughout both lungs greater on right, nonspecific;  this could reflect small airways disease, active alveolitis, edema,  or pulmonary hypertension.   Clare Gandy, MD 08/21/2013, 7:33 AM PGY-1, [facility] Family Medicine FPTS Intern pager: [phone], text pages welcome

## 2013-08-21 NOTE — Progress Notes (Signed)
@  0245 came to pt's room after she had finished using bedside commode to find her diaphoretic, tachypnic, anxious, and endorsing sharp left sided chest pain 6/10. Pt endorsed that she felt this same way at home with her recent CHF exacerbation onset. VSS, STAT 12 Lead EKG obtained (NSR) and MD (Dr. Piedad Climes) paged. Morphine, Troponins, and Stat Chest X-ray ordered. Morphine administered with relief of chest pa in and PRN Zofran administered for sudden onset nausea. Presently pt resting comfortably in bed. Will continue to monitor and assess.

## 2013-08-22 DIAGNOSIS — I340 Nonrheumatic mitral (valve) insufficiency: Secondary | ICD-10-CM

## 2013-08-22 DIAGNOSIS — I059 Rheumatic mitral valve disease, unspecified: Secondary | ICD-10-CM

## 2013-08-22 DIAGNOSIS — I5021 Acute systolic (congestive) heart failure: Secondary | ICD-10-CM

## 2013-08-22 LAB — BASIC METABOLIC PANEL
BUN: 11 mg/dL (ref 6–23)
CO2: 34 mEq/L — ABNORMAL HIGH (ref 19–32)
Calcium: 9.1 mg/dL (ref 8.4–10.5)
Calcium: 9.7 mg/dL (ref 8.4–10.5)
Creatinine, Ser: 1.05 mg/dL (ref 0.50–1.10)
Creatinine, Ser: 1.09 mg/dL (ref 0.50–1.10)
GFR calc Af Amer: 71 mL/min — ABNORMAL LOW (ref >90)
GFR calc Af Amer: 68 mL/min — ABNORMAL LOW (ref >90)
GFR calc non Af Amer: 61 mL/min — ABNORMAL LOW (ref >90)
GFR calc non Af Amer: 59 mL/min — ABNORMAL LOW (ref >90)
Glucose, Bld: 103 mg/dL — ABNORMAL HIGH (ref 70–99)
Potassium: 4.5 mEq/L (ref 3.5–5.1)
Sodium: 138 mEq/L (ref 135–145)
Sodium: 137 mEq/L (ref 135–145)

## 2013-08-22 MED ORDER — CARVEDILOL 3.125 MG PO TABS
3.1250 mg | ORAL_TABLET | Freq: Two times a day (BID) | ORAL | Status: DC
  Administered 2013-08-24 – 2013-08-28 (×6): 3.125 mg via ORAL
  Filled 2013-08-22 (×13): qty 1

## 2013-08-22 MED ORDER — DIGOXIN 125 MCG PO TABS
0.1250 mg | ORAL_TABLET | Freq: Every day | ORAL | Status: DC
  Administered 2013-08-23 – 2013-08-28 (×6): 0.125 mg via ORAL
  Filled 2013-08-22 (×7): qty 1

## 2013-08-22 MED ORDER — FUROSEMIDE 80 MG PO TABS
80.0000 mg | ORAL_TABLET | Freq: Three times a day (TID) | ORAL | Status: DC
  Filled 2013-08-22 (×3): qty 1

## 2013-08-22 MED ORDER — ATORVASTATIN CALCIUM 40 MG PO TABS
40.0000 mg | ORAL_TABLET | Freq: Every day | ORAL | Status: DC
  Administered 2013-08-22 – 2013-08-28 (×7): 40 mg via ORAL
  Filled 2013-08-22 (×7): qty 1

## 2013-08-22 MED ORDER — MAGNESIUM SULFATE 40 MG/ML IJ SOLN
2.0000 g | Freq: Once | INTRAMUSCULAR | Status: DC
  Filled 2013-08-22: qty 50

## 2013-08-22 MED ORDER — FUROSEMIDE 10 MG/ML IJ SOLN
40.0000 mg | Freq: Two times a day (BID) | INTRAMUSCULAR | Status: DC
  Administered 2013-08-22 – 2013-08-23 (×2): 40 mg via INTRAVENOUS
  Filled 2013-08-22 (×2): qty 4

## 2013-08-22 NOTE — Consult Note (Signed)
Advanced Heart Failure Team Consult Note  Referring Physician: FMTS Primary Physician: None Primary Cardiologist: None  Reason for Consultation: Acute systolic HF  HPI:    [name] is a 47 yo female with a history of anemia, diastolic heart failure, HTN, and polysubstance abuse. Reports she was told at one point she had an irregular heartbeat. Previous ECHO (04/2010) EF 50-55% with grd 1 DD, LVIDd 47mm, mild to mod MR, RA and LA mildly dilated. The HF team was asked to provide a consult from the FMTS.   She reports that she has a history of smoking crack-cocaine for about 20 years, however quit at least 2 years ago. Denies ETOH abuse and smoke cigarettes occasionally. Reports never having a heart catheterization. She was able to perform all ADLs and had no complaints of SOB until about 2 months ago when she started developing LE edema R>L, SOB, and abdominal swelling. She was working at the time and was worried if she came to the hospital she would get admitted and her work would suffer. Once she was laid off she asked her son to bring her to the hospital.    Presented to the ED 10/27 with progressive SOB, CP, palpitations and edema. Pertinent labs on admission were negative UDS, Hgb 8.8, K+ 4.3, Cr 0.99, pro-BNP ~7000 and negative troponin's. Has been receiving lasix 40 mg IV BID and is -12 liters and weight is down 11 lbs. CT negative for PE. Breathing better. No orthopnea or PND. Denies CP.   ECHO 08/20/13: EF 30-35%, global HK, LVIDd 62 mm, moderate mitral regurg with rheumatic appearing MV , RA and LA severely dilated, mod/severe TR, peak PA 44  SH: Lives in [place] with brother and unemployed; No ETOH or drug use, occasional nicotine  FH: Mother deceased (pacemaker, [place], seizures)        3 brothers all living no health issues.   Review of Systems: [y] = yes, [ ]  = no   General: Weight gain [Y ]; Weight loss [ ] ; Anorexia [ ] ; Fatigue [Y ]; Fever [ ] ; Chills [ ] ; Weakness [ ]    Cardiac: Chest pain/pressure [ Y]; Resting SOB [ Y]; Exertional SOB [Y ]; Orthopnea [ Y]; Pedal Edema [Y ]; Palpitations [ ] ; Syncope [ ] ; Presyncope [ ] ; Paroxysmal nocturnal dyspnea[ ]   Pulmonary: Cough [ ] ; Wheezing[ ] ; Hemoptysis[ ] ; Sputum [ ] ; Snoring [ ]   GI: Vomiting[ ] ; Dysphagia[ ] ; Melena[ ] ; Hematochezia [ ] ; Heartburn[ ] ; Abdominal pain [ ] ; Constipation [ ] ; Diarrhea [ ] ; BRBPR [ ]   GU: Hematuria[ ] ; Dysuria [ ] ; Nocturia[ ]   Vascular: Pain in legs with walking [ ] ; Pain in feet with lying flat [ ] ; Non-healing sores [ ] ; Stroke [ ] ; TIA [ ] ; Slurred speech [ ] ;  Neuro: Headaches[ ] ; Vertigo[ ] ; Seizures[ ] ; Paresthesias[ ] ;Blurred vision [ ] ; Diplopia [ ] ; Vision changes [ ]   Ortho/Skin: Arthritis [ ] ; Joint pain [ ] ; Muscle pain [ ] ; Joint swelling [ ] ; Back Pain [ ] ; Rash [ ]   Psych: Depression[ ] ; Anxiety[ ]   Heme: Bleeding problems [Y ]; Clotting disorders [ ] ; Anemia [ ]   Endocrine: Diabetes [ ] ; Thyroid dysfunction[ ]   Home Medications Prior to Admission medications   Medication Sig Start Date End Date Taking? Authorizing Provider  albuterol (VENTOLIN HFA) 108 (90 BASE) MCG/ACT inhaler Inhale 2 puffs into the lungs 4 (four) times daily as needed for wheezing.    Yes Historical Provider, MD  diphenhydramine-acetaminophen (TYLENOL  PM) 25-500 MG TABS Take 2 tablets by mouth at bedtime as needed (for pain).   Yes Historical Provider, MD  ibuprofen (ADVIL,MOTRIN) 200 MG tablet Take 800 mg by mouth every 8 (eight) hours as needed for pain.   Yes Historical Provider, MD    Past Medical History: Past Medical History  Diagnosis Date  . Hypertension   . Anemia   . Arrhythmia     Past Surgical History: History reviewed. No pertinent past surgical history.  Family History: History reviewed. No pertinent family history.  Social History: History   Social History  . Marital Status: Legally Separated    Spouse Name: N/A    Number of Children: N/A  . Years of Education:  N/A   Social History Main Topics  . Smoking status: Current Some Day Smoker  . Smokeless tobacco: None  . Alcohol Use: No  . Drug Use: No     Comment: former  . Sexual Activity: None   Other Topics Concern  . None   Social History Narrative  . None    Allergies:  No Known Allergies  Objective:    Vital Signs:   Temp:  [97.3 F (36.3 C)-97.6 F (36.4 C)] 97.4 F (36.3 C) (10/30 0400) Pulse Rate:  [65-94] 85 (10/30 0400) Resp:  [12-21] 12 (10/30 0400) BP: (113-147)/(64-94) 138/88 mmHg (10/30 1033) SpO2:  [97 %-100 %] 100 % (10/30 0400) Weight:  [155 lb 10.3 oz (70.6 kg)] 155 lb 10.3 oz (70.6 kg) (10/30 0500) Last BM Date: 08/20/13  Weight change: Filed Weights   08/20/13 0500 08/21/13 0500 08/22/13 0500  Weight: 167 lb 1.7 oz (75.8 kg) 158 lb 11.7 oz (72 kg) 155 lb 10.3 oz (70.6 kg)    Intake/Output:   Intake/Output Summary (Last 24 hours) at 08/22/13 1138 Last data filed at 08/22/13 1034  Gross per 24 hour  Intake    497 ml  Output   3125 ml  Net  -2628 ml     Physical Exam: General:  Well appearing. No resp difficulty; lying in bed HEENT: normal Neck: supple. JVP 9 . Carotids 2+ bilat; no bruits. No lymphadenopathy or thryomegaly appreciated. Cor: PMI nondisplaced. Regular rate & rhythm. No rubs. + 3/6 TR/MR +s3. Lungs: clear Abdomen: soft, nontender, nondistended. No hepatosplenomegaly. No bruits or masses. Good bowel sounds. Extremities: no cyanosis, clubbing, rash, 1+ bilateral edema Neuro: alert & orientedx3, cranial nerves grossly intact. moves all 4 extremities w/o difficulty. Affect pleasant  Telemetry: SR 80s  Labs: Basic Metabolic Panel:  Recent Labs Lab 08/19/13 1754 08/19/13 2235 08/20/13 0905 08/20/13 1935 08/21/13 2210 08/22/13 0723  NA 134* 136  --  141 139 138  K 4.3 2.9*  --  3.7 4.1 4.5  CL 102 100  --  99 97 100  CO2 21 27  --  32 34* 34*  GLUCOSE 103* 132*  --  113* 157* 94  BUN 13 12  --  13 11 11   CREATININE 0.99 0.97   --  1.13* 1.17* 1.05  CALCIUM 8.5 8.6  --  9.3 9.5 9.1  MG  --   --  1.8  --   --   --     Liver Function Tests:  Recent Labs Lab 08/19/13 2235  AST 21  ALT 20  ALKPHOS 89  BILITOT 0.7  PROT 6.1  ALBUMIN 3.0*   No results found for this basename: LIPASE, AMYLASE,  in the last 168 hours No results found for this basename: AMMONIA,  in the last 168 hours  CBC:  Recent Labs Lab 08/19/13 1754 08/19/13 2235  WBC 10.5 9.2  HGB 8.8* 9.2*  HCT 33.5* 33.2*  MCV 72.8* 70.8*  PLT 422* 415*    Cardiac Enzymes:  Recent Labs Lab 08/19/13 2108 08/20/13 0505 08/20/13 0905 08/21/13 0610 08/21/13 1345  TROPONINI <0.30 <0.30 <0.30 <0.30 <0.30    BNP: BNP (last 3 results)  Recent Labs  08/19/13 1754  PROBNP 6945.0*    CBG: No results found for this basename: GLUCAP,  in the last 168 hours  Coagulation Studies: No results found for this basename: LABPROT, INR,  in the last 72 hours  Other results: EKG: SR 93 QRS 80ms LAE. PRWP. Non specific ST-T wave abnormalities.    Imaging: Ct Abdomen Pelvis W Contrast  08/21/2013   CLINICAL DATA:  Mid to lower abdominal pain. Shortness of breath. Chills. Anemia.  EXAM: CT ABDOMEN AND PELVIS WITH CONTRAST  TECHNIQUE: Multidetector CT imaging of the abdomen and pelvis was performed using the standard protocol following bolus administration of intravenous contrast.  CONTRAST:  80mL OMNIPAQUE IOHEXOL 300 MG/ML  SOLN  COMPARISON:  No priors.  FINDINGS: Lung Bases: Cardiomegaly. Passive subsegmental atelectasis in the left lower lobe. Subsegmental atelectasis or scarring in the right lower lobe.  Abdomen/Pelvis: The appearance of the liver, gallbladder, pancreas, spleen, bilateral adrenal glands and bilateral kidneys is unremarkable. No significant volume of ascites. No pneumoperitoneum. No pathologic distention of small bowel. No definite lymphadenopathy identified within the abdomen or pelvis. Atherosclerosis throughout the abdominal  and pelvic vasculature, without evidence of aneurysm or dissection. Normal appendix. The uterus is massively enlarged measuring approximately 16.4 x 10.4 x 14.0, and is markedly heterogeneous with internal areas of both hypervascularity and hypovascularity most compatible with a large fibroid uterus. Ovaries are unremarkable in appearance. Urinary bladder is normal in appearance.  Musculoskeletal: Diffuse body wall edema. Multiple small sclerotic lesions scattered throughout the pelvis bilaterally, favored to represent tiny bone islands.  IMPRESSION: 1. No acute findings in the abdomen or pelvis. 2. However, there is marked enlargement of the uterus, which is suggestive of multifocal fibroids. 3. Diffuse body wall edema. 4. Moderate cardiomegaly. 5. Additional incidental findings, as above.   Electronically Signed   By: Trudie Reed M.D.   On: 08/21/2013 13:23   Dg Chest Port 1 View  08/21/2013   CLINICAL DATA:  Tachypnea wheezing  EXAM: PORTABLE CHEST - 1 VIEW  COMPARISON:  08/19/2013  FINDINGS: Unchanged cardiopericardial enlargement, including pericardial effusion based on recent CT. There is diffuse interstitial and airspace opacities. Enlarged central vessels. Possible small pleural effusions. No pneumothorax.  IMPRESSION: Cardiopericardial enlargement (including pericardial effusion based on recent CT) with mild pulmonary edema.   Electronically Signed   By: Tiburcio Pea M.D.   On: 08/21/2013 03:47      Medications:     Current Medications: . aspirin EC  81 mg Oral Daily  . atorvastatin  40 mg Oral q1800  . furosemide  80 mg Oral TID  . heparin  5,000 Units Subcutaneous Q8H  . hydrochlorothiazide  6.25 mg Oral Daily  . lisinopril  5 mg Oral Daily  . potassium chloride  30 mEq Oral BID  . sodium chloride  3 mL Intravenous Q12H     Infusions:      Assessment:   1) Acute systolic heart failure, probable NICM      -EF 30-35% (07/2013) 2) HTN 3) Anemia 4) Polysubstance  abuse 5) ? COPD 6) Valvular  heart disease with moderate to severe MR/TR  Plan/Discussion:    Stacey Lin is a pleasant 47 yo female with a previous history of grade I diastolic heart failure who now has an EF 30-35% with mod TR and MR.  Presented to the ED with NYHA IIIb symptoms and marked volume overload. Has diuresed 12 liters and weight down 11 lbs, however her fluid status remains elevated. Will stop HCTZ and continue IV lasix 40 mg BID. Cr is stable and will continue to follow. She is currently on lisinopril 5 mg daily and SBP 120-130s. Agree with concern in history of crack cocaine abuse and starting BB, however after talking to the patient she reports that she has not used in over 2 years and had a (-) UDS so after she is close to euvolemic would start coreg 3.125 mg BID. Mag on admission 1.8 will supplement with 2gm.   She does have a history of HTN, but no CAD. Undetermined etiology of systolic heart failure will place for TSH, acute hepatis panel, ANA, HIV, and SPEP/UPEP. She will need L/RHC once volume status closer to baseline to assess coronaries and hemodynamics.   Will place consult for CR for education and ambulation.   Appreciate the consult.   Length of Stay: 3  Stacey Lin 08/22/2013, 11:38 AM  Advanced Heart Failure Team Pager [phone] (M-F; 7a - 4p)  Please contact [facility] for night-coverage after hours (4p -7a ) and weekends on [email or web address]  Patient seen and examined with Stacey Potash, NP. We discussed all aspects of the encounter. I agree with the assessment and plan as stated above. Echo reviewed personally.   She is improved with IV lasix. Echo shows dilated LV with EF 30-35% and significant MR/TR and  Severe biatrial enlargement. I suspect she has non-ischemic/dilated cardiomyopathy with resultant valvular disease but it is possible that valvular disease is primary issue.   Agree with checking serologies. Can likely defer cath for now as I  doubt this is ischemic in nature. Will need aggressive medication titration with b-blocker, ace, spiro, digoxin and lasix.  We will follow closely. We appreciate the consult.  Truman Hayward 5:39 PM

## 2013-08-22 NOTE — Progress Notes (Signed)
CARDIAC REHAB PHASE I   PRE:  Rate/Rhythm: 93SR  BP:  Supine:   Sitting: 126/78  Standing:    SaO2: 98-99%1L  MODE:  Ambulation: 175 ft   POST:  Rate/Rhythm: 91  BP:  Supine:   Sitting: 140/80  Standing:    SaO2: 98%2L 1435-1515 Pt walked 175 ft on 2L with rolling walker and asst x 1. C/o noodle legs and feeling light headed with short walk. Back to bed after walk. Gave pt CHF booklet and reviewed signs of CHF. Pt could not believe that she now knows why her feet and belly were so swollen. Will continue ed tomorrow.   Luetta Nutting, RN BSN  08/22/2013 3:16 PM

## 2013-08-22 NOTE — Progress Notes (Signed)
FMTS Attending Daily Note: Denny Levy MD [phone] pager office [phone] I have discussed this patient with the resident and reviewed the assessment and plan as documented above. I agree wit the resident's findings and plan.Consult in to heart failure team.

## 2013-08-22 NOTE — Progress Notes (Signed)
Report called to the receiving RN on 2W. Pt transferred to 2W13 via wheel chair accompanied by Tech on monitor. All belongings went with pt.

## 2013-08-22 NOTE — Progress Notes (Signed)
Family Medicine Teaching Service Daily Progress Note Intern Pager: [phone]  Patient name: Stacey Lin Medical record number: [id number] Date of birth: 05-23-1966 Age: 47 y.o. Gender: female  Primary Care Provider: No primary provider on file. Consultants: CCM Code Status: Full   Pt Overview and Major Events to Date:   Assessment and Plan: Stacey Lin is a 47 y.o. female presenting with progressive SOB/dyspnea, CP, and asymmetrical LE edema. PMH is significant for anemia, dCHF grade II, HTN, CAD, polysubstanceabuse, COPD?   #H/o dCHF grade II with new onset sCHF: Unsure of dry weight, but has diuresed 12.5L during admission.  Will a avoid bblockers given likely "wet" CHF exacerbation and h/o cocaine use. Continued SOB likely from persistent lung disease from tobacco adn crack cocaine use. ECHO: LV EF 30-35%, systolic function was moderately to severely reduced. BNP >6500 on admission. CTA: negative for PE  - consult CHF team as new dx of sCHF w/ EF below 40 - Change to and increase dose to PO Lasix 80 mg TID  (previously on 40mg  IV BID) -  ASA 81  - Transfer to Tele - f/u I/O's   #CAD: CP off an on at home an on admission. Troponins on multiple occasions all neg. 10/29 EKG: unchanged from previous. Hgb A1c 5.7 but very hypertensive and a smoker.  - Starting atorvastatin 40   #HTN: Blood pressure stable as of this morning.  - Lisinopril 5 Qday (will monitor for renal impairment given ongoing diuresis) - Hydralazine PRN SBP > 200 and diastolic > 120 - HCTZ 6.25 Qday  - spironolactone could be started due to low potassium due to the lasix.   #hypokalemia: Low during brisk diuresis but stable this am.  Mg 1.8  - BMET in am  #Right lower leg edema: h/o of edema worse in right than left. H/o of fibroids and enlarged uterus. CT: Abd/plv w/ marked uterine enlargement (16x10x14), secondary to fibroids. May be producing mass effect on vasculature/lymphatics causing LE  edema vs CHF exacerbation.  - diuresis as above - oupt OB f/u for possible hysterectomy  # Res: Continues to have O2 requirement. Likely from CHF and possibly compounded by COPD/tobacco/crack use. Pulm feels mostly due to CHF. CTA w/o PE    - Monitor respiratory status closely as likely to improve w/ diuresis - PFTs after discharge  - O2 via [place]. Will escalate to CPAP vs BIPAP as needed  - Tobacco cessation ( no nicotine, unless requested, as only smoking 1-2pd)   # Microcytic anemia (mixed: ACD vs Iron Def) : 8.8 w/ baseline around 10. Received IV iron once. Will administer once more. Anemia panel: Iron 16 low, Saturation 4% low, UIBC 418% high  Ferritin 6 low. reticulocytes: wnl . Hgb trending up - FOBT: pending   # FEN/GI: Tolerating PO  - Heart healthy diet  - SLIV  - Miralax PRN   # Social: h/o substance abuse (pt claims only smoking crack). Clean since ~2012. UDS: neg. ETOH: <11  #Prophylaxis:  - Hep [facility] TID  Disposition: pending improvement of shortness of breath and diuresis.  Subjective: NAEO. Feeling better today. Still on O2  Objective: Temp:  [97.3 F (36.3 C)-97.6 F (36.4 C)] 97.4 F (36.3 C) (10/30 0400) Pulse Rate:  [65-94] 85 (10/30 0400) Resp:  [12-21] 12 (10/30 0400) BP: (113-147)/(64-94) 138/88 mmHg (10/30 1033) SpO2:  [97 %-100 %] 100 % (10/30 0400) Weight:  [155 lb 10.3 oz (70.6 kg)] 155 lb 10.3 oz (70.6 kg) (10/30 0500)  Filed  Weights   08/20/13 0500 08/21/13 0500 08/22/13 0500  Weight: 167 lb 1.7 oz (75.8 kg) 158 lb 11.7 oz (72 kg) 155 lb 10.3 oz (70.6 kg)    Physical Exam: General: Mod distress, WNWD  Cardiovascular: RRR, tachy, III/VI noisy and almost musical systolic murmur most pronounced at apex.  Respiratory: Mild crackles in lung bases (atelectasis vs effusion), slight increased WOB.  Extremities: 2+ LE edema to the level of the thigh Skin: intact, warm, well perfused    Laboratory:  Recent Labs Lab 08/19/13 1754 08/19/13 2235  WBC  10.5 9.2  HGB 8.8* 9.2*  HCT 33.5* 33.2*  PLT 422* 415*    Recent Labs Lab 08/19/13 2235 08/20/13 1935 08/21/13 2210 08/22/13 0723  NA 136 141 139 138  K 2.9* 3.7 4.1 4.5  CL 100 99 97 100  CO2 27 32 34* 34*  BUN 12 13 11 11   CREATININE 0.97 1.13* 1.17* 1.05  CALCIUM 8.6 9.3 9.5 9.1  PROT 6.1  --   --   --   BILITOT 0.7  --   --   --   ALKPHOS 89  --   --   --   ALT 20  --   --   --   AST 21  --   --   --   GLUCOSE 132* 113* 157* 94      Recent Labs Lab 08/19/13 2108 08/20/13 0505 08/20/13 0905 08/21/13 0610 08/21/13 1345  TROPONINI <0.30 <0.30 <0.30 <0.30 <0.30     Imaging/Diagnostic Tests: PORTABLE CHEST - 1 VIEW 10/29 IMPRESSION:  Cardiopericardial enlargement (including pericardial effusion based  on recent CT) with mild pulmonary edema.  Bilateral lower extremity venous duplex 10/28  Bilateral lower extremities are negative for deep vein thrombosis. There is no evidence of Baker's cyst bilaterally.  Dg Chest 2 View  08/19/2013   IMPRESSION: Cardiomegaly, vascular congestion. Probable compressive atelectasis in the left lower lobe.   CTA 10/17 IMPRESSION:  No evidence of pulmonary embolism.  Mildly enlarged central pulmonary arteries.  Enlarged heart with small pericardial and right pleural effusions.  Mild atelectasis at both lung bases with scattered areas of mosaic  attenuation throughout both lungs greater on right, nonspecific;  this could reflect small airways disease, active alveolitis, edema,  or pulmonary hypertension.   Ozella Rocks, MD 08/22/2013, 10:56 AM PGY-3, [facility] FPTS Intern pager: [phone], text pages welcome

## 2013-08-23 DIAGNOSIS — J449 Chronic obstructive pulmonary disease, unspecified: Secondary | ICD-10-CM

## 2013-08-23 LAB — BASIC METABOLIC PANEL
BUN: 11 mg/dL (ref 6–23)
CO2: 35 mEq/L — ABNORMAL HIGH (ref 19–32)
Calcium: 9.7 mg/dL (ref 8.4–10.5)
Chloride: 99 mEq/L (ref 96–112)
Creatinine, Ser: 1.08 mg/dL (ref 0.50–1.10)
GFR calc Af Amer: 69 mL/min — ABNORMAL LOW (ref >90)
Glucose, Bld: 148 mg/dL — ABNORMAL HIGH (ref 70–99)

## 2013-08-23 LAB — TSH: TSH: 2.589 u[IU]/mL (ref 0.350–4.500)

## 2013-08-23 MED ORDER — SPIRONOLACTONE 12.5 MG HALF TABLET
12.5000 mg | ORAL_TABLET | Freq: Every day | ORAL | Status: DC
  Administered 2013-08-23 – 2013-08-25 (×3): 12.5 mg via ORAL
  Filled 2013-08-23 (×4): qty 1

## 2013-08-23 MED ORDER — FUROSEMIDE 40 MG PO TABS
40.0000 mg | ORAL_TABLET | Freq: Two times a day (BID) | ORAL | Status: DC
  Administered 2013-08-23 – 2013-08-25 (×5): 40 mg via ORAL
  Filled 2013-08-23 (×8): qty 1

## 2013-08-23 NOTE — Progress Notes (Signed)
Patient does not have insurance and has trouble paying for medications. Have talked to Case Management and she will need Match Program to pay for meds on discharge for 30 days. We will see her in the HF clinic and can help with HF meds through our program on the outpatient side. Have also discussed with Financial Counselors about Medicaid and they currently are working with the patient.

## 2013-08-23 NOTE — Progress Notes (Signed)
FMTS Attending Daily Note: Sara Neal MD [phone] pager office [phone] I  have seen and examined this patient, reviewed their chart. I have discussed this patient with the resident. I agree with the resident's findings, assessment and care plan. 

## 2013-08-23 NOTE — Care Management Note (Unsigned)
    Page 1 of 1   08/23/2013     2:32:44 PM   CARE MANAGEMENT NOTE 08/23/2013  Patient:  Stacey Lin,Stacey Lin   Account Number:  [id number]  Date Initiated:  08/20/2013  Documentation initiated by:  Donn Pierini  Subjective/Objective Assessment:   Pt admitted with Pulmonary edema, HTN, CHF     Action/Plan:   PTA pt lived at home   Anticipated DC Date:  08/23/2013   Anticipated DC Plan:  HOME/SELF CARE  In-house referral  Financial Counselor      DC Planning Services  CM consult  Medication Assistance  [facility]      Choice offered to / List presented to:             Status of service:  In process, will continue to follow Medicare Important Message given?   (If response is "NO", the following Medicare IM given date fields will be blank) Date Medicare IM given:   Date Additional Medicare IM given:    Discharge Disposition:  HOME/SELF CARE  Per UR Regulation:  Reviewed for med. necessity/level of care/duration of stay  If discussed at Long Length of Stay Meetings, dates discussed:    Comments:  08/23/13 Tranika Scholler,RN,BSN [phone] MET WITH PT TO DISCUSS DC PLANS.  PT LIVES WITH BROTHER AT HOME.  SHE IS IN NEED OF SCALES, SO THAT SHE CAN DO DAILY WEIGHTS.  PT GIVEN SCALES THROUGH WISH PROGRAM.  SHE IS APPRECIATIVE OF THIS.  PT HAS BEEN OUT OF WORK, AND IS WORKING ON GETTING DISABILITY.  SHE IS ELIGIBLE FOR MEDICATION ASSISTANCE THROUGH [facility] MATCH PROGRAM.  PT FOR LIKELY DC OVER THE WEEKEND, PER PT.  WILL PROVIDE MATCH LETTER AND EXPLANATION OF PROGRAM BENEFITS.

## 2013-08-23 NOTE — Progress Notes (Signed)
Advanced Heart Failure Rounding Note  Subjective:    Ms. Gunawan is a 47 yo female with a history of anemia, diastolic heart failure, HTN, and polysubstance abuse. Reports she was told at one point she had an irregular heartbeat. Previous ECHO (04/2010) EF 50-55% with grd 1 DD, LVIDd 47mm, mild to mod MR, RA and LA mildly dilated.   ECHO 08/20/13: EF 30-35%, global HK, LVIDd 62 mm, moderate mitral regurg with rheumatic appearing MV , RA and LA severely dilated, mod/severe TR, peak PA 44  Lasix continued 40 mg IV BID. 24 hr I/O -1.6, weight down 10 lbs (weight on standing scale). Weight down 21 pounds total. Denies SOB, orthopnea, or CP. Ambulated with CR 170 ft, with mild dizziness.    Objective:   Weight Range:  Vital Signs:   Temp:  [97.6 F (36.4 C)-99 F (37.2 C)] 98.1 F (36.7 C) (10/31 0508) Pulse Rate:  [88-94] 90 (10/31 0508) Resp:  [18-20] 20 (10/31 0508) BP: (117-138)/(71-88) 117/77 mmHg (10/31 0508) SpO2:  [94 %-100 %] 94 % (10/31 0508) Weight:  [145 lb 9.6 oz (66.044 kg)] 145 lb 9.6 oz (66.044 kg) (10/31 0508) Last BM Date: 08/22/13  Weight change: Filed Weights   08/21/13 0500 08/22/13 0500 08/23/13 0508  Weight: 158 lb 11.7 oz (72 kg) 155 lb 10.3 oz (70.6 kg) 145 lb 9.6 oz (66.044 kg)    Intake/Output:   Intake/Output Summary (Last 24 hours) at 08/23/13 0704 Last data filed at 08/22/13 1700  Gross per 24 hour  Intake    243 ml  Output   1925 ml  Net  -1682 ml     Physical Exam: General: Well appearing. No resp difficulty; lying in bed  HEENT: normal  Neck: supple. JVP 8 . Carotids 2+ bilat; no bruits. No lymphadenopathy or thryomegaly appreciated.  Cor: PMI nondisplaced. Regular rate & rhythm. No rubs. + 3/6 TR/MR +s3.  Lungs: clear  Abdomen: soft, nontender, nondistended. No hepatosplenomegaly. No bruits or masses. Good bowel sounds.  Extremities: no cyanosis, clubbing, rash, Trace bilateral edema  Neuro: alert & orientedx3, cranial nerves grossly  intact. moves all 4 extremities w/o difficulty. Affect pleasant   Telemetry: SR 90s  Labs: Basic Metabolic Panel:  Recent Labs Lab 08/20/13 0905 08/20/13 1935 08/21/13 2210 08/22/13 0723 08/22/13 1630 08/23/13 0457  NA  --  141 139 138 137 138  K  --  3.7 4.1 4.5 4.5 4.8  CL  --  99 97 100 96 99  CO2  --  32 34* 34* 36* 35*  GLUCOSE  --  113* 157* 94 103* 148*  BUN  --  13 11 11 10 11   CREATININE  --  1.13* 1.17* 1.05 1.09 1.08  CALCIUM  --  9.3 9.5 9.1 9.7 9.7  MG 1.8  --   --   --   --   --     Liver Function Tests:  Recent Labs Lab 08/19/13 2235  AST 21  ALT 20  ALKPHOS 89  BILITOT 0.7  PROT 6.1  ALBUMIN 3.0*   No results found for this basename: LIPASE, AMYLASE,  in the last 168 hours No results found for this basename: AMMONIA,  in the last 168 hours  CBC:  Recent Labs Lab 08/19/13 1754 08/19/13 2235  WBC 10.5 9.2  HGB 8.8* 9.2*  HCT 33.5* 33.2*  MCV 72.8* 70.8*  PLT 422* 415*    Cardiac Enzymes:  Recent Labs Lab 08/19/13 2108 08/20/13 0505 08/20/13 0905 08/21/13  9604 08/21/13 1345  TROPONINI <0.30 <0.30 <0.30 <0.30 <0.30    BNP: BNP (last 3 results)  Recent Labs  08/19/13 1754  PROBNP 6945.0*     Other results:    Imaging: Ct Abdomen Pelvis W Contrast  08/21/2013   CLINICAL DATA:  Mid to lower abdominal pain. Shortness of breath. Chills. Anemia.  EXAM: CT ABDOMEN AND PELVIS WITH CONTRAST  TECHNIQUE: Multidetector CT imaging of the abdomen and pelvis was performed using the standard protocol following bolus administration of intravenous contrast.  CONTRAST:  80mL OMNIPAQUE IOHEXOL 300 MG/ML  SOLN  COMPARISON:  No priors.  FINDINGS: Lung Bases: Cardiomegaly. Passive subsegmental atelectasis in the left lower lobe. Subsegmental atelectasis or scarring in the right lower lobe.  Abdomen/Pelvis: The appearance of the liver, gallbladder, pancreas, spleen, bilateral adrenal glands and bilateral kidneys is unremarkable. No significant  volume of ascites. No pneumoperitoneum. No pathologic distention of small bowel. No definite lymphadenopathy identified within the abdomen or pelvis. Atherosclerosis throughout the abdominal and pelvic vasculature, without evidence of aneurysm or dissection. Normal appendix. The uterus is massively enlarged measuring approximately 16.4 x 10.4 x 14.0, and is markedly heterogeneous with internal areas of both hypervascularity and hypovascularity most compatible with a large fibroid uterus. Ovaries are unremarkable in appearance. Urinary bladder is normal in appearance.  Musculoskeletal: Diffuse body wall edema. Multiple small sclerotic lesions scattered throughout the pelvis bilaterally, favored to represent tiny bone islands.  IMPRESSION: 1. No acute findings in the abdomen or pelvis. 2. However, there is marked enlargement of the uterus, which is suggestive of multifocal fibroids. 3. Diffuse body wall edema. 4. Moderate cardiomegaly. 5. Additional incidental findings, as above.   Electronically Signed   By: Trudie Reed M.D.   On: 08/21/2013 13:23      Medications:     Scheduled Medications: . aspirin EC  81 mg Oral Daily  . atorvastatin  40 mg Oral q1800  . carvedilol  3.125 mg Oral BID WC  . digoxin  0.125 mg Oral Daily  . furosemide  40 mg Intravenous BID  . heparin  5,000 Units Subcutaneous Q8H  . lisinopril  5 mg Oral Daily  . magnesium sulfate 1 - 4 g bolus IVPB  2 g Intravenous Once  . potassium chloride  30 mEq Oral BID  . sodium chloride  3 mL Intravenous Q12H     Infusions:     PRN Medications:  sodium chloride, acetaminophen, hydrALAZINE, ondansetron (ZOFRAN) IV, oxyCODONE, sodium chloride   Assessment:   1) Acute systolic heart failure, probable NICM  -EF 30-35% (07/2013)  2) HTN  3) Anemia  4) Polysubstance abuse  5) ? 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 Benard is a pleasant 47 yo female with a  previous history of grade I diastolic heart failure who now has an EF 30-35% with mod TR and MR.  Stable overnight. Weight down 10 lbs, (standing weight today). Volume status improved will change to po lasix. Cr stable. Low dose BB started yesterday along lisinopril 5 mg daily. SBP 115-120/70s. She is having dizziness with ambulation, will not titrate any medications today. Would like to add spiro and titrate meds over the weekend if possible.   Iron 16 and TIBC 4, received 2 dose of feraheme. Will need follow up as outpatient.  Does not have insurance will ask financial counselors to come see and help place medicaid application. She will need assistance with discharge medications for  first 30 days and then the HF Team assistance can help on outpatient side.  Continue to work with CR.   Length of Stay: 4   Aundria Rud 08/23/2013, 7:04 AM  Advanced Heart Failure Team Pager [phone] (M-F; 7a - 4p)  Please contact [facility] for night-coverage after hours (4p -7a ) and weekends on [email or web address]  Much improved with diuresis. Will switch to po lasix today.  Continue current regimen for now.   Based on echo report I worry she has underlying rheumatic heart disease and may eventually require MV/TV surgery. Will review echo this am to try and determine if valvular disease is primary issue and dilated CM is secondary to this (or vice versa). Also d/w Dr. Cornelius Moras who will review echo.  May be suitable for d/c in next day or two with very close f/u in HF Clinic. Appointment made in HF Clinic next Thursday.  Daniel Bensimhon,MD 9:12 AM

## 2013-08-23 NOTE — Progress Notes (Signed)
CARDIAC REHAB PHASE I   PRE:  Rate/Rhythm: 89SR  BP:  Supine:   Sitting: 94/66  Standing:    SaO2: 94%RA  MODE:  Ambulation: 550 ft   POST:  Rate/Rhythm: 101  BP:  Supine:   Sitting: 110/74  Standing:    SaO2: 90-94%RA 1004-1104 Pt walked 550 ft on RA with gait belt use and rolling walker. Gait steady. Legs a little weak still. Will ask co worker to try without walker tomorrow. Reviewed zones of CHF, 2000mg  sodium restriction and gave handouts, importance of daily weights and when to notify MD, and exercise guidelines. Gave smoking cessation handouts. Pt stated would not be problem to quit smoking as she only smokes a couple a day. Talked with case manager who is going to get pt scales for home. Pt tearful about possible surgery. Emotional support given.   Luetta Nutting, RN BSN  08/23/2013 10:59 AM

## 2013-08-23 NOTE — Progress Notes (Signed)
Family Medicine Teaching Service Daily Progress Note Intern Pager: [phone]  Patient name: Stacey Lin Medical record number: [id number] Date of birth: [birth year] Age: 47 y.o. Gender: female  Primary Care Provider: No primary provider on file. Consultants: CCM Code Status: Full   Pt Overview and Major Events to Date:   Assessment and Plan: Kai Kennon is a 47 y.o. female presenting with progressive SOB/dyspnea, CP, and asymmetrical LE edema. PMH is significant for anemia, dCHF grade II, HTN, CAD, polysubstanceabuse, COPD?   #H/o dCHF grade II with new onset sCHF: Unsure of dry weight, but has diuresed 13.5L during admission. BNP >6500 on admission. CTA: negative for PE  - CHF team rec's: Switch to PO lasix today. Worry of rheumatic heart disease and may require MV/TV surgery. Will discuss further.  - PO lasix BID  -  ASA 81  - f/u I/O's   #CAD: CP off an on at home an on admission. Troponins on multiple occasions all neg. 10/29 EKG: unchanged from previous. Hgb A1c 5.7 but very hypertensive and a smoker.  - Starting atorvastatin 40  #HTN: Blood pressure stable as of this morning.  - Lisinopril 5 Qday (will monitor for renal impairment given ongoing diuresis) - Hydralazine PRN SBP > 200 and diastolic > 120 - HCTZ 6.25 Qday  - Coreg 3.125 mg BID   #hypokalemia: Low during brisk diuresis but stable this am.  Mg 1.8  - normalized   #Right lower leg edema: h/o of edema worse in right than left. H/o of fibroids and enlarged uterus. CT: Abd/plv w/ marked uterine enlargement (16x10x14), secondary to fibroids. May be producing mass effect on vasculature/lymphatics causing LE edema vs CHF exacerbation.  - diuresis as above - oupt OB f/u for possible hysterectomy  # Res: Continues to have O2 requirement. Likely from CHF and possibly compounded by COPD/tobacco/crack use. Pulm feels mostly due to CHF. CTA w/o PE    - Monitor respiratory status closely as likely to  improve w/ diuresis - PFTs after discharge  - O2 via [place]. Will escalate to CPAP vs BIPAP as needed  - Tobacco cessation ( no nicotine, unless requested, as only smoking 1-2pd)   # Microcytic anemia (mixed: ACD vs Iron Def) : 8.8 w/ baseline around 10. Received IV iron once. Will administer once more. Anemia panel: Iron 16 low, Saturation 4% low, UIBC 418% high  Ferritin 6 low. reticulocytes: wnl . Hgb trending up - FOBT: pending   # FEN/GI: Tolerating PO  - Heart healthy diet  - SLIV  - Miralax PRN   # Social: h/o substance abuse (pt claims only smoking crack). Clean since ~2012. UDS: neg. ETOH: <11  #Prophylaxis:  - Hep [place] TID  Disposition: pending improvement of shortness of breath and diuresis.  Subjective Much improved this AM. No complaints and breathing on RA.   Objective: Temp:  [97.6 F (36.4 C)-99 F (37.2 C)] 98.1 F (36.7 C) (10/31 0508) Pulse Rate:  [88-94] 90 (10/31 0508) Resp:  [18-20] 20 (10/31 0508) BP: (117-138)/(71-88) 117/77 mmHg (10/31 0508) SpO2:  [94 %-100 %] 94 % (10/31 0508) Weight:  [145 lb 9.6 oz (66.044 kg)] 145 lb 9.6 oz (66.044 kg) (10/31 0508)  Filed Weights   08/21/13 0500 08/22/13 0500 08/23/13 0508  Weight: 158 lb 11.7 oz (72 kg) 155 lb 10.3 oz (70.6 kg) 145 lb 9.6 oz (66.044 kg)       Physical Exam: General: Mod distress, WNWD  Cardiovascular: RRR, tachy, III/VI noisy and almost musical systolic  murmur most pronounced at apex.  Respiratory: CTAB, no wheezes or crackles.  Extremities: 2+ LE edema to the level of the thigh Skin: intact, warm, well perfused    Laboratory:  Recent Labs Lab 08/19/13 1754 08/19/13 2235  WBC 10.5 9.2  HGB 8.8* 9.2*  HCT 33.5* 33.2*  PLT 422* 415*    Recent Labs Lab 08/19/13 2235  08/22/13 0723 08/22/13 1630 08/23/13 0457  NA 136  < > 138 137 138  K 2.9*  < > 4.5 4.5 4.8  CL 100  < > 100 96 99  CO2 27  < > 34* 36* 35*  BUN 12  < > 11 10 11   CREATININE 0.97  < > 1.05 1.09 1.08  CALCIUM 8.6   < > 9.1 9.7 9.7  PROT 6.1  --   --   --   --   BILITOT 0.7  --   --   --   --   ALKPHOS 89  --   --   --   --   ALT 20  --   --   --   --   AST 21  --   --   --   --   GLUCOSE 132*  < > 94 103* 148*  < > = values in this interval not displayed.    Recent Labs Lab 08/19/13 2108 08/20/13 0505 08/20/13 0905 08/21/13 0610 08/21/13 1345  TROPONINI <0.30 <0.30 <0.30 <0.30 <0.30   Iron/TIBC/Ferritin    Component Value Date/Time   IRON 16* 08/19/2013 2235   TIBC 434 08/19/2013 2235   FERRITIN 6* 08/19/2013 2235     Imaging/Diagnostic Tests: PORTABLE CHEST - 1 VIEW 10/29 IMPRESSION:  Cardiopericardial enlargement (including pericardial effusion based  on recent CT) with mild pulmonary edema.  Bilateral lower extremity venous duplex 10/28  Bilateral lower extremities are negative for deep vein thrombosis. There is no evidence of Baker's cyst bilaterally.  Dg Chest 2 View  08/19/2013   IMPRESSION: Cardiomegaly, vascular congestion. Probable compressive atelectasis in the left lower lobe.   CTA 10/17 IMPRESSION:  No evidence of pulmonary embolism.  Mildly enlarged central pulmonary arteries.  Enlarged heart with small pericardial and right pleural effusions.  Mild atelectasis at both lung bases with scattered areas of mosaic  attenuation throughout both lungs greater on right, nonspecific;  this could reflect small airways disease, active alveolitis, edema,  or pulmonary hypertension.   Clare Gandy, MD 08/23/2013, 9:48 AM PGY-3, [facility] FPTS Intern pager: [phone], text pages welcome

## 2013-08-24 LAB — HEPATITIS PANEL, ACUTE
Hep A IgM: NONREACTIVE
Hepatitis B Surface Ag: NEGATIVE

## 2013-08-24 LAB — CBC
Hemoglobin: 9 g/dL — ABNORMAL LOW (ref 12.0–15.0)
MCH: 20 pg — ABNORMAL LOW (ref 26.0–34.0)
MCHC: 27 g/dL — ABNORMAL LOW (ref 30.0–36.0)
RDW: 20.4 % — ABNORMAL HIGH (ref 11.5–15.5)

## 2013-08-24 LAB — BASIC METABOLIC PANEL
BUN: 16 mg/dL (ref 6–23)
CO2: 30 mEq/L (ref 19–32)
Calcium: 9.3 mg/dL (ref 8.4–10.5)
Creatinine, Ser: 1.28 mg/dL — ABNORMAL HIGH (ref 0.50–1.10)
GFR calc Af Amer: 56 mL/min — ABNORMAL LOW (ref >90)
GFR calc non Af Amer: 48 mL/min — ABNORMAL LOW (ref >90)
Glucose, Bld: 107 mg/dL — ABNORMAL HIGH (ref 70–99)
Sodium: 136 mEq/L (ref 135–145)

## 2013-08-24 LAB — MAGNESIUM: Magnesium: 2.2 mg/dL (ref 1.5–2.5)

## 2013-08-24 NOTE — Progress Notes (Signed)
CARDIAC REHAB PHASE I   PRE:  Rate/Rhythm: 101 ST  BP:  Sitting: 100/64     SaO2: 95 RA  MODE:  Ambulation: 200 ft   POST:  Rate/Rhythm: 105  BP:  Sitting: 110/66    SaO2: 96 RA  Pt ambulated 200 ft with c/o of nausea and leg weakness.  Pt did not use RW and occasionally used the hand rails on the wall for support and assist x1, with no rest periods.  Overall, tolerated walk well.  Pts SaO2 remained stable and >95% during ambulation.   Reviewed CHF signs and symptoms with pt. [phone]  Marvene Staff MS, ACSM RCEP 08/24/2013

## 2013-08-24 NOTE — Progress Notes (Signed)
FMTS Attending Daily Note: Stacey Baney MD [phone] pager office [phone] I  have seen and examined this patient, reviewed their chart. I have discussed this patient with the resident. I agree with the resident's findings, assessment and care plan. 

## 2013-08-24 NOTE — Progress Notes (Signed)
Family Medicine Teaching Service Daily Progress Note Intern Pager: [phone]  Patient name: Stacey Lin Medical record number: [id number] Date of birth: [birth year] Age: 47 y.o. Gender: female  Primary Care Provider: No primary provider on file. Consultants: CCM Code Status: Full   Pt Overview and Major Events to Date:  10/31 - Transitioned to PO lasix. Followed by Heart Failure Team.   Assessment and Plan: Stacey Lin is a 47 y.o. female presenting with progressive SOB/dyspnea, CP, and asymmetrical LE edema. PMH is significant for anemia, dCHF grade II, HTN, CAD, polysubstanceabuse, COPD?   #H/o dCHF grade II with new onset sCHF: Unsure of dry weight, but has diuresed 14.8L during admission with at least 21lb weight loss. BNP >6500 on admission. CTA: negative for PE  - CHF team rec's: Switch to PO lasix yesterday, continues to have good diuresis with 1.3L out yesterday. Started on Coreg 3.125mg  (no concern for current cocaine use) - Worry of rheumatic heart disease and may require MV/TV surgery. Await CHF team's evaluation of echo. - PO lasix BID  - ASA 81  - f/u I/O's and weights - Will f/u with CHF clinic on Thursday - Creat is trending up, monitor with continued diuresis  #CAD: CP off an on at home an on admission. Troponins on multiple occasions all neg. 10/29 EKG: unchanged from previous. Hgb A1c 5.7 but very hypertensive and a smoker.  - Starting atorvastatin 40 - Smoking cessation counseling - Monitor HgB (9.0 today)  #HTN: Blood pressure stable, with a few soft pressures (94/61 this morning) - Con't Lisinopril 5mg  for now but monitor since creat has been trending up to 1.28. - Hydralazine PRN SBP > 200 and diastolic > 120 - Coreg 3.125 mg BID  - Soft BP this morning, monitor vitals per floor but holding antihypertensive doses for now.  # Hypokalemia: Low during brisk diuresis but stable this am at 5.0.  Mg 1.8 on admission, s/p mag replacement -  normalized, monitor Bmet daily - Monitor Spironolactone use since her K is now 5.0  #Right lower leg edema: h/o of edema worse in right than left. H/o of fibroids and enlarged uterus. CT: Abd/plv w/ marked uterine enlargement (16x10x14), secondary to fibroids. May be producing mass effect on vasculature/lymphatics causing LE edema vs CHF exacerbation.  - diuresis as above - oupt OB f/u for possible hysterectomy due to fibroids.  # Res: New O2 requirement on admission, now on RA and doing well. Likely from CHF and possibly compounded by COPD/tobacco/crack use. Pulm feels mostly due to CHF. CTA w/o PE. She has been >24 hours without O2 and states she felt fine last night without supplemental oxygen.  - Monitor respiratory status closely as likely to improve w/ diuresis - PFTs after discharge. Does not meet qualifications for home o2 - Tobacco cessation ( no nicotine, unless requested, as only smoking 1-2pd)   # Microcytic anemia (mixed: ACD vs Iron Def) : 8.8 w/ baseline around 10. Received IV iron once. Will administer once more. Anemia panel: Iron 16 low, Saturation 4% low, UIBC 418% high  Ferritin 6 low. reticulocytes: wnl . Hgb trending up - FOBT: pending  - CBC tomorrow  # FEN/GI: Tolerating PO  - Heart healthy diet  - SLIV  - Miralax PRN   # Social: h/o substance abuse (pt claims only smoking crack). Clean since ~2012. UDS: neg. ETOH: <11  #Prophylaxis:  - Hep [place] TID  Disposition: pending improvement of shortness of breath and diuresis. Awaiting CHF team's evaluation of  echo to determine if she will need surgery.   Subjective Reports no complaints this morning. No O2 requirement overnight.   Objective: Temp:  [98 F (36.7 C)] 98 F (36.7 C) (11/01 0454) Pulse Rate:  [88-92] 92 (11/01 0454) Resp:  [18] 18 (11/01 0454) BP: (94-123)/(61-80) 94/61 mmHg (11/01 0454) SpO2:  [93 %-95 %] 95 % (11/01 0454)  Filed Weights   08/21/13 0500 08/22/13 0500 08/23/13 0508  Weight: 158 lb  11.7 oz (72 kg) 155 lb 10.3 oz (70.6 kg) 145 lb 9.6 oz (66.044 kg)       Physical Exam: General: In bed on right side, appears tired but NAD Cardiovascular: RRR, III/VI systolic murmur Respiratory: CTAB, no wheezes or crackles.  Extremities: 1+ LE edema Skin: intact, warm, well perfused  Neuro: grossly nonfocal   Laboratory:  Recent Labs Lab 08/19/13 1754 08/19/13 2235 08/24/13 0515  WBC 10.5 9.2 9.5  HGB 8.8* 9.2* 9.0*  HCT 33.5* 33.2* 33.3*  PLT 422* 415* 409*    Recent Labs Lab 08/19/13 2235  08/22/13 1630 08/23/13 0457 08/24/13 0515  NA 136  < > 137 138 136  K 2.9*  < > 4.5 4.8 5.0  CL 100  < > 96 99 98  CO2 27  < > 36* 35* 30  BUN 12  < > 10 11 16   CREATININE 0.97  < > 1.09 1.08 1.28*  CALCIUM 8.6  < > 9.7 9.7 9.3  PROT 6.1  --   --   --   --   BILITOT 0.7  --   --   --   --   ALKPHOS 89  --   --   --   --   ALT 20  --   --   --   --   AST 21  --   --   --   --   GLUCOSE 132*  < > 103* 148* 107*  < > = values in this interval not displayed.    Recent Labs Lab 08/19/13 2108 08/20/13 0505 08/20/13 0905 08/21/13 0610 08/21/13 1345  TROPONINI <0.30 <0.30 <0.30 <0.30 <0.30   Iron/TIBC/Ferritin    Component Value Date/Time   IRON 16* 08/19/2013 2235   TIBC 434 08/19/2013 2235   FERRITIN 6* 08/19/2013 2235     Imaging/Diagnostic Tests: PORTABLE CHEST - 1 VIEW 10/29 IMPRESSION:  Cardiopericardial enlargement (including pericardial effusion based  on recent CT) with mild pulmonary edema.  Bilateral lower extremity venous duplex 10/28  Bilateral lower extremities are negative for deep vein thrombosis. There is no evidence of Baker's cyst bilaterally.  Dg Chest 2 View  08/19/2013   IMPRESSION: Cardiomegaly, vascular congestion. Probable compressive atelectasis in the left lower lobe.   CTA 10/17 IMPRESSION:  No evidence of pulmonary embolism.  Mildly enlarged central pulmonary arteries.  Enlarged heart with small pericardial and right  pleural effusions.  Mild atelectasis at both lung bases with scattered areas of mosaic  attenuation throughout both lungs greater on right, nonspecific;  this could reflect small airways disease, active alveolitis, edema,  or pulmonary hypertension.   Hilarie Fredrickson, MD 08/24/2013, 7:05 AM PGY-3, [facility] FPTS Intern pager: [phone], text pages welcome

## 2013-08-24 NOTE — Progress Notes (Addendum)
Patient ID: Nashya Holbert, female   DOB: [birth year], 47 y.o.   MRN: [id number]    SUBJECTIVE:   Patient is feeling better. It is of note that some of her volume overload has been manifested by abdominal swelling. She says that she is amazed with the decrease in size of her abdomen. She continues to diuresis. Today is the first day that she has slight increase in her creatinine. Also, her potassium is rising steadily. She is on spironolactone. This will have to be stopped if her potassium continues to rise after her potassium supplement is stopped. I have stopped her potassium.  I've chosen not to stop her diuretic. Because of changing renal function and electrolytes, I feel that keeping her in the hospital to least tomorrow will be most prudent.   Filed Vitals:   08/23/13 1330 08/23/13 2208 08/24/13 0454 08/24/13 0700  BP: 115/67 105/72 94/61   Pulse: 92 92 92   Temp: 98 F (36.7 C) 98 F (36.7 C) 98 F (36.7 C)   TempSrc: Oral Oral Oral   Resp: 18 18 18    Height:      Weight:    143 lb 4.8 oz (65 kg)  SpO2: 93% 95% 95%      Intake/Output Summary (Last 24 hours) at 08/24/13 0854 Last data filed at 08/24/13 0800  Gross per 24 hour  Intake    480 ml  Output   2000 ml  Net  -1520 ml    LABS: Basic Metabolic Panel:  Recent Labs  57/84/69 0457 08/24/13 0515  NA 138 136  K 4.8 5.0  CL 99 98  CO2 35* 30  GLUCOSE 148* 107*  BUN 11 16  CREATININE 1.08 1.28*  CALCIUM 9.7 9.3   Liver Function Tests: No results found for this basename: AST, ALT, ALKPHOS, BILITOT, PROT, ALBUMIN,  in the last 72 hours No results found for this basename: LIPASE, AMYLASE,  in the last 72 hours CBC:  Recent Labs  08/24/13 0515  WBC 9.5  HGB 9.0*  HCT 33.3*  MCV 73.8*  PLT 409*   Cardiac Enzymes:  Recent Labs  08/21/13 1345  TROPONINI <0.30   BNP: No components found with this basename: POCBNP,  D-Dimer: No results found for this basename: DDIMER,  in the last 72  hours Hemoglobin A1C: No results found for this basename: HGBA1C,  in the last 72 hours Fasting Lipid Panel: No results found for this basename: CHOL, HDL, LDLCALC, TRIG, CHOLHDL, LDLDIRECT,  in the last 72 hours Thyroid Function Tests:  Recent Labs  08/23/13 0457  TSH 2.589    RADIOLOGY: Dg Chest 2 View  08/19/2013   CLINICAL DATA:  Shortness of breath, cough.  EXAM: CHEST  2 VIEW  COMPARISON:  05/04/2010  FINDINGS: Cardiomegaly with vascular congestion. Probable compressive atelectasis in the left lower lobe. Otherwise, no confluent airspace opacities. No effusions. No acute bony abnormality.  IMPRESSION: Cardiomegaly, vascular congestion. Probable compressive atelectasis in the left lower lobe.   Electronically Signed   By: Charlett Nose M.D.   On: 08/19/2013 16:53   Ct Angio Chest Pe W/cm &/or Wo Cm  08/19/2013   CLINICAL DATA:  Lightheaded for several weeks, swelling right leg, difficulty breathing, unable sleep a night, orthopnea, shortness of breath, labored breathing, back pain, history hypertension  EXAM: CT ANGIOGRAPHY CHEST WITH CONTRAST  TECHNIQUE: Multidetector CT imaging of the chest was performed using the standard protocol during bolus administration of intravenous contrast. Multiplanar CT image reconstructions including  MIPs were obtained to evaluate the vascular anatomy.  CONTRAST:  80mL OMNIPAQUE IOHEXOL 350 MG/ML SOLN  COMPARISON:  None  FINDINGS: Aorta normal caliber without aneurysm or dissection.  Small pericardial effusion.  Heart appears enlarged.  Visualized portion of upper abdomen unremarkable.  Scattered respiratory motion artifacts.  Enlarged main pulmonary artery measuring up to 3.2 x 3.1 cm diameter.  Pulmonary arteries patent.  No evidence of pulmonary embolism.  Scattered ground-glass opacities in both lungs with the appearance of mosaic attenuation.  Bibasilar atelectasis.  Small right pleural effusion.  No segmental consolidation, pneumothorax or left pleural  effusion.  No acute osseous findings.  Review of the MIP images confirms the above findings.  IMPRESSION: No evidence of pulmonary embolism.  Mildly enlarged central pulmonary arteries.  Enlarged heart with small pericardial and right pleural effusions.  Mild atelectasis at both lung bases with scattered areas of mosaic attenuation throughout both lungs greater on right, nonspecific; this could reflect small airways disease, active alveolitis, edema, or pulmonary hypertension.   Electronically Signed   By: Ulyses Southward M.D.   On: 08/19/2013 21:05   Ct Abdomen Pelvis W Contrast  08/21/2013   CLINICAL DATA:  Mid to lower abdominal pain. Shortness of breath. Chills. Anemia.  EXAM: CT ABDOMEN AND PELVIS WITH CONTRAST  TECHNIQUE: Multidetector CT imaging of the abdomen and pelvis was performed using the standard protocol following bolus administration of intravenous contrast.  CONTRAST:  80mL OMNIPAQUE IOHEXOL 300 MG/ML  SOLN  COMPARISON:  No priors.  FINDINGS: Lung Bases: Cardiomegaly. Passive subsegmental atelectasis in the left lower lobe. Subsegmental atelectasis or scarring in the right lower lobe.  Abdomen/Pelvis: The appearance of the liver, gallbladder, pancreas, spleen, bilateral adrenal glands and bilateral kidneys is unremarkable. No significant volume of ascites. No pneumoperitoneum. No pathologic distention of small bowel. No definite lymphadenopathy identified within the abdomen or pelvis. Atherosclerosis throughout the abdominal and pelvic vasculature, without evidence of aneurysm or dissection. Normal appendix. The uterus is massively enlarged measuring approximately 16.4 x 10.4 x 14.0, and is markedly heterogeneous with internal areas of both hypervascularity and hypovascularity most compatible with a large fibroid uterus. Ovaries are unremarkable in appearance. Urinary bladder is normal in appearance.  Musculoskeletal: Diffuse body wall edema. Multiple small sclerotic lesions scattered throughout the  pelvis bilaterally, favored to represent tiny bone islands.  IMPRESSION: 1. No acute findings in the abdomen or pelvis. 2. However, there is marked enlargement of the uterus, which is suggestive of multifocal fibroids. 3. Diffuse body wall edema. 4. Moderate cardiomegaly. 5. Additional incidental findings, as above.   Electronically Signed   By: Trudie Reed M.D.   On: 08/21/2013 13:23   Dg Chest Port 1 View  08/21/2013   CLINICAL DATA:  Tachypnea wheezing  EXAM: PORTABLE CHEST - 1 VIEW  COMPARISON:  08/19/2013  FINDINGS: Unchanged cardiopericardial enlargement, including pericardial effusion based on recent CT. There is diffuse interstitial and airspace opacities. Enlarged central vessels. Possible small pleural effusions. No pneumothorax.  IMPRESSION: Cardiopericardial enlargement (including pericardial effusion based on recent CT) with mild pulmonary edema.   Electronically Signed   By: Tiburcio Pea M.D.   On: 08/21/2013 03:47    PHYSICAL EXAM     Patient's comfortable in bed she's lying flat. Lungs reveal scattered rhonchi. Cardiac exam reveals S1 and S2. The abdomen is still mildly distended. There is no peripheral edema.   TELEMETRY:    I have reviewed telemetry today August 24, 2013. There is normal sinus rhythm.  ASSESSMENT AND PLAN:    Acute systolic heart failure     Patient continues to diuresis. I outlined in the first sentence as above that I feel she should remain in the hospital today. There is an appointment for her in the CHF clinic next Thursday. She may be able to be discharged home tomorrow.    Mitral valve regurgitation     Dr. Gala Romney has noted that he plans to review the echo concerning her valvular disease. Her length of stay in the hospital is not dependent on this final assessment of her echo. Decisions about her valvular disease can be made as an outpatient. Timing of discharge will be related to her volume status and electrolytes. I have stopped potassium  supplementation. Chemistry lab is ordered for tomorrow. I have left her on Lasix and spironolactone.  Hyperkalemia   Potassium was rising up to 5.0. Potassium supplementation is stopped. However I left her on spironolactone for today.   Willa Rough 08/24/2013 8:54 AM

## 2013-08-25 LAB — CBC
Hemoglobin: 10.8 g/dL — ABNORMAL LOW (ref 12.0–15.0)
MCH: 20 pg — ABNORMAL LOW (ref 26.0–34.0)
MCH: 21.5 pg — ABNORMAL LOW (ref 26.0–34.0)
MCHC: 26.7 g/dL — ABNORMAL LOW (ref 30.0–36.0)
MCHC: 28.3 g/dL — ABNORMAL LOW (ref 30.0–36.0)
MCV: 74.8 fL — ABNORMAL LOW (ref 78.0–100.0)
MCV: 76.1 fL — ABNORMAL LOW (ref 78.0–100.0)
Platelets: 410 10*3/uL — ABNORMAL HIGH (ref 150–400)
Platelets: 425 10*3/uL — ABNORMAL HIGH (ref 150–400)
RBC: 4.56 MIL/uL (ref 3.87–5.11)
RDW: 21.3 % — ABNORMAL HIGH (ref 11.5–15.5)
RDW: 21.2 % — ABNORMAL HIGH (ref 11.5–15.5)
WBC: 9.7 10*3/uL (ref 4.0–10.5)
WBC: 11 10*3/uL — ABNORMAL HIGH (ref 4.0–10.5)

## 2013-08-25 LAB — IRON AND TIBC: Iron: 894 ug/dL — ABNORMAL HIGH (ref 42–135)

## 2013-08-25 LAB — BASIC METABOLIC PANEL
BUN: 20 mg/dL (ref 6–23)
CO2: 32 mEq/L (ref 19–32)
Calcium: 9.5 mg/dL (ref 8.4–10.5)
Creatinine, Ser: 1.23 mg/dL — ABNORMAL HIGH (ref 0.50–1.10)
GFR calc non Af Amer: 51 mL/min — ABNORMAL LOW (ref >90)
Sodium: 136 mEq/L (ref 135–145)

## 2013-08-25 NOTE — Progress Notes (Signed)
Family Medicine Teaching Service Daily Progress Note Intern Pager: [phone]  Patient name: Stacey Lin Medical record number: [id number] Date of birth: July 31, 1966 Age: 47 y.o. Gender: female  Primary Care Provider: No primary provider on file. Consultants: Cardiology [place] Code Status: Full  Pt Overview and Major Events to Date:  10/31 - Transitioned to PO lasix. Followed by Heart Failure Team.  Assessment and Plan: Stacey Lin is a 47 y.o. female presenting with progressive SOB/dyspnea, CP, and asymmetrical LE edema. PMH is significant for anemia, dCHF grade II, HTN, CAD, polysubstanceabuse, COPD?   #H/o dCHF grade II with new onset sCHF: Unsure of dry weight, but has diuresed 16L during admission with at least 21lb weight loss. BNP >6500 on admission. CTA: negative for PE  - CHF team rec's: continue po lasix 40mg  bid and spironolactone - continue coreg 3.125mg  bid - valvular disease will be evaluated as outpatient, per cardiology recommendations.  - ASA 81  - Will f/u with CHF clinic on Thursday  - creatinine stable. Continue current diuresis and follow up in CHF clinic.  - with report of lightheadedness: obtain orthostatics  #CAD: CP off and on at home an on admission. Troponins on multiple occasions all neg. 10/29 EKG: unchanged from previous. Hgb A1c 5.7 but very hypertensive and a smoker.  - Starting atorvastatin 40  - Smoking cessation counseling  - Monitor HgB (9.0 today)   #HTN: Blood pressure overnight in the 100's systolic.  - Con't Lisinopril 5mg  for now but monitor since creat has been trending up to 1.28.  - monitor orthostatics - Coreg 3.125 mg BID   # Hypokalemia: Low during brisk diuresis. Stable today at 4.0.   - continue spironolactone and lisinopril - monitor K on follow up as outpatient  #Right lower leg edema: h/o of edema worse in right than left. H/o of fibroids and enlarged uterus. CT: Abd/plv w/ marked uterine enlargement  (16x10x14), secondary to fibroids. May be producing mass effect on vasculature/lymphatics causing LE edema vs CHF exacerbation.  - diuresis as above  - oupt OB f/u for possible hysterectomy due to fibroids.   # Res: New O2 requirement on admission, now on RA and doing well. Likely from CHF and possibly compounded by COPD/tobacco/crack use. Pulm feels mostly due to CHF. CTA w/o PE. She has been >24 hours without O2 and states she felt fine last night without supplemental oxygen.  - PFTs after discharge. Does not meet qualifications for home o2  - Tobacco cessation ( no nicotine, unless requested, as only smoking 1-2pd)   # Microcytic anemia (mixed: ACD vs Iron Def) : 9.1 today. Baseline: 9.7-10.8. Received IV ferraheme.  Anemia panel: Iron 16 low, Saturation 4% low, UIBC 418% high Ferritin 6 low. reticulocytes: wnl . Hgb trending up.  - repeat iron studies pending to see status s/p ferraheme - transfuse 1u pRBC given Hg<10 in cardiac patient and symptomatic   # FEN/GI: Tolerating PO  - Heart healthy diet  - SLIV  - Miralax PRN  # Social: h/o substance abuse (pt claims only smoking crack). Clean since ~2012. UDS: neg. ETOH: <11  #Prophylaxis:  - Hep [place] TID  Disposition: pending normal orthostatics and iron panel, potentially going home today.    Subjective:  Some light headedness with walking. She states that this has been ongoing and chronic for her ever since she started feeling sick. Shortness of breath has improved. Able to ambulate to bathroom without trouble. No chest pain.   Objective: Temp:  [97.8 F (  36.6 C)-98.3 F (36.8 C)] 97.8 F (36.6 C) (11/02 0413) Pulse Rate:  [83-95] 83 (11/02 0741) Resp:  [16-17] 17 (11/02 0413) BP: (102-117)/(53-87) 109/53 mmHg (11/02 0741) SpO2:  [92 %-98 %] 92 % (11/02 0413) Weight:  [141 lb 8.6 oz (64.2 kg)] 141 lb 8.6 oz (64.2 kg) (11/02 0413) Physical Exam: General: No acute distress, alert and oriented x4, laying in bed Cardiovascular:  S1S2, RRR, 3/6 systolic murmur heard throughout pericardium  Respiratory: normal work of breathing, clear to auscultation bilaterally, no wheezing or crackles Abdomen: soft, non tender Extremities: no pitting edema  Laboratory:  Recent Labs Lab 08/19/13 2235 08/24/13 0515 08/25/13 0445  WBC 9.2 9.5 9.7  HGB 9.2* 9.0* 9.1*  HCT 33.2* 33.3* 34.1*  PLT 415* 409* 410*    Recent Labs Lab 08/19/13 2235  08/23/13 0457 08/24/13 0515 08/25/13 0445  NA 136  < > 138 136 136  K 2.9*  < > 4.8 5.0 4.0  CL 100  < > 99 98 97  CO2 27  < > 35* 30 32  BUN 12  < > 11 16 20   CREATININE 0.97  < > 1.08 1.28* 1.23*  CALCIUM 8.6  < > 9.7 9.3 9.5  PROT 6.1  --   --   --   --   BILITOT 0.7  --   --   --   --   ALKPHOS 89  --   --   --   --   ALT 20  --   --   --   --   AST 21  --   --   --   --   GLUCOSE 132*  < > 148* 107* 108*  < > = values in this interval not displayed.  Iron and TIBC: pending   Imaging/Diagnostic Tests: CT abdomen 08/21/13 CLINICAL DATA: Mid to lower abdominal pain. Shortness of breath.  Chills. Anemia.  EXAM:  CT ABDOMEN AND PELVIS WITH CONTRAST  TECHNIQUE:  Multidetector CT imaging of the abdomen and pelvis was performed  using the standard protocol following bolus administration of  intravenous contrast.  CONTRAST: 80mL OMNIPAQUE IOHEXOL 300 MG/ML SOLN  COMPARISON: No priors.  FINDINGS:  Lung Bases: Cardiomegaly. Passive subsegmental atelectasis in the  left lower lobe. Subsegmental atelectasis or scarring in the right  lower lobe.  Abdomen/Pelvis: The appearance of the liver, gallbladder, pancreas,  spleen, bilateral adrenal glands and bilateral kidneys is  unremarkable. No significant volume of ascites. No pneumoperitoneum.  No pathologic distention of small bowel. No definite lymphadenopathy  identified within the abdomen or pelvis. Atherosclerosis throughout  the abdominal and pelvic vasculature, without evidence of aneurysm  or dissection. Normal  appendix. The uterus is massively enlarged  measuring approximately 16.4 x 10.4 x 14.0, and is markedly  heterogeneous with internal areas of both hypervascularity and  hypovascularity most compatible with a large fibroid uterus. Ovaries  are unremarkable in appearance. Urinary bladder is normal in  appearance.  Musculoskeletal: Diffuse body wall edema. Multiple small sclerotic  lesions scattered throughout the pelvis bilaterally, favored to  represent tiny bone islands.  IMPRESSION:  1. No acute findings in the abdomen or pelvis.  2. However, there is marked enlargement of the uterus, which is  suggestive of multifocal fibroids.  3. Diffuse body wall edema.  4. Moderate cardiomegaly.  5. Additional incidental findings, as above.  Chest xray: 08/21/13 FINDINGS:  Unchanged cardiopericardial enlargement, including pericardial  effusion based on recent CT. There is diffuse interstitial and  airspace opacities. Enlarged central vessels. Possible small pleural  effusions. No pneumothorax.  IMPRESSION:  Cardiopericardial enlargement (including pericardial effusion based  on recent CT) with mild pulmonary edema.   Lonia Skinner, MD 08/25/2013, 9:40 AM PGY-3, [facility] FPTS Intern pager: [phone], text pages welcome

## 2013-08-25 NOTE — Progress Notes (Signed)
Nursing note Pt bp running in 90s this afternoon, 96/50s currently. Family practice MD made aware and orders received to hold coreg this PM. Will continue to monitor patient Stacey Lin, Randall An  rN

## 2013-08-25 NOTE — Progress Notes (Signed)
   CARE MANAGEMENT NOTE 08/25/2013  Patient:  Stacey Lin,Stacey Lin   Account Number:  [id number]  Date Initiated:  08/20/2013  Documentation initiated by:  Donn Pierini  Subjective/Objective Assessment:   Pt admitted with Pulmonary edema, HTN, CHF     Action/Plan:   PTA pt lived at home   Anticipated DC Date:  08/23/2013   Anticipated DC Plan:  HOME/SELF CARE  In-house referral  Financial Counselor      DC Planning Services  CM consult  Medication Assistance  [facility] [place]      Choice offered to / List presented to:             Status of service:  In process, will continue to follow Medicare Important Message given?   (If response is "NO", the following Medicare IM given date fields will be blank) Date Medicare IM given:   Date Additional Medicare IM given:    Discharge Disposition:  HOME/SELF CARE  Per UR Regulation:  Reviewed for med. necessity/level of care/duration of stay  If discussed at Long Length of Stay Meetings, dates discussed:    Comments:  08/25/13 12:35 RN called CM to room to give pt [facility] for pt to pursue Medicaid and future health appts.  Pt states she is now living with her brother and new phone contact is Karl Pock [phone] and a new phone has been installed at residence [phone] or, as pt states, may be [phone].  Will continue to follow. Freddy Jaksch, BSN, CM [phone].   Pt 08/23/13 JULIE AMERSON,RN,BSN [phone] MET WITH PT TO DISCUSS DC PLANS.  PT LIVES WITH BROTHER AT HOME.  SHE IS IN NEED OF SCALES, SO THAT SHE CAN DO DAILY WEIGHTS.  PT GIVEN SCALES THROUGH WISH PROGRAM.  SHE IS APPRECIATIVE OF THIS.  PT HAS BEEN OUT OF WORK, AND IS WORKING ON GETTING DISABILITY.  SHE IS ELIGIBLE FOR MEDICATION ASSISTANCE THROUGH [facility].  PT FOR LIKELY DC OVER THE WEEKEND, PER PT.  WILL PROVIDE MATCH LETTER AND EXPLANATION OF PROGRAM BENEFITS.

## 2013-08-25 NOTE — Progress Notes (Signed)
FMTS Attending Daily Note: Denny Levy MD [phone] pager office [phone] I  have seen and examined this patient, reviewed their chart. I have discussed this patient with the resident. I agree with the resident's findings, assessment and care plan.Still lightheaded and hgb < 10 so will tnsf 1 unit.

## 2013-08-25 NOTE — Progress Notes (Addendum)
Patient ID: Stacey Lin, female   DOB: 1966-05-01, 47 y.o.   MRN: [id number]    SUBJECTIVE: Creatinine is stable at 1.2. Potassium is now down to 4.0 after I stopped potassium supplementation yesterday. Patient has diuresed further. She says she is a little lightheaded today. Her orthostatic blood pressures could be checked to be sure that she is not orthostatic.   Filed Vitals:   08/24/13 1332 08/24/13 1650 08/24/13 2022 08/25/13 0413  BP: 111/54 117/87 103/86 102/63  Pulse: 86 95 87 86  Temp: 98.3 F (36.8 C)  98 F (36.7 C) 97.8 F (36.6 C)  TempSrc: Oral  Oral Oral  Resp: 16  17 17   Height:      Weight:    141 lb 8.6 oz (64.2 kg)  SpO2: 98%  96% 92%     Intake/Output Summary (Last 24 hours) at 08/25/13 0726 Last data filed at 08/25/13 0513  Gross per 24 hour  Intake   1161 ml  Output   2825 ml  Net  -1664 ml    LABS: Basic Metabolic Panel:  Recent Labs  43/32/95 0515 08/25/13 0445  NA 136 136  K 5.0 4.0  CL 98 97  CO2 30 32  GLUCOSE 107* 108*  BUN 16 20  CREATININE 1.28* 1.23*  CALCIUM 9.3 9.5  MG 2.2  --    Liver Function Tests: No results found for this basename: AST, ALT, ALKPHOS, BILITOT, PROT, ALBUMIN,  in the last 72 hours No results found for this basename: LIPASE, AMYLASE,  in the last 72 hours CBC:  Recent Labs  08/24/13 0515 08/25/13 0445  WBC 9.5 9.7  HGB 9.0* 9.1*  HCT 33.3* 34.1*  MCV 73.8* 74.8*  PLT 409* 410*   Cardiac Enzymes: No results found for this basename: CKTOTAL, CKMB, CKMBINDEX, TROPONINI,  in the last 72 hours BNP: No components found with this basename: POCBNP,  D-Dimer: No results found for this basename: DDIMER,  in the last 72 hours Hemoglobin A1C: No results found for this basename: HGBA1C,  in the last 72 hours Fasting Lipid Panel: No results found for this basename: CHOL, HDL, LDLCALC, TRIG, CHOLHDL, LDLDIRECT,  in the last 72 hours Thyroid Function Tests:  Recent Labs  08/23/13 0457  TSH 2.589      RADIOLOGY: Dg Chest 2 View  08/19/2013   CLINICAL DATA:  Shortness of breath, cough.  EXAM: CHEST  2 VIEW  COMPARISON:  05/04/2010  FINDINGS: Cardiomegaly with vascular congestion. Probable compressive atelectasis in the left lower lobe. Otherwise, no confluent airspace opacities. No effusions. No acute bony abnormality.  IMPRESSION: Cardiomegaly, vascular congestion. Probable compressive atelectasis in the left lower lobe.   Electronically Signed   By: Charlett Nose M.D.   On: 08/19/2013 16:53   Ct Angio Chest Pe W/cm &/or Wo Cm  08/19/2013   CLINICAL DATA:  Lightheaded for several weeks, swelling right leg, difficulty breathing, unable sleep a night, orthopnea, shortness of breath, labored breathing, back pain, history hypertension  EXAM: CT ANGIOGRAPHY CHEST WITH CONTRAST  TECHNIQUE: Multidetector CT imaging of the chest was performed using the standard protocol during bolus administration of intravenous contrast. Multiplanar CT image reconstructions including MIPs were obtained to evaluate the vascular anatomy.  CONTRAST:  80mL OMNIPAQUE IOHEXOL 350 MG/ML SOLN  COMPARISON:  None  FINDINGS: Aorta normal caliber without aneurysm or dissection.  Small pericardial effusion.  Heart appears enlarged.  Visualized portion of upper abdomen unremarkable.  Scattered respiratory motion artifacts.  Enlarged main pulmonary  artery measuring up to 3.2 x 3.1 cm diameter.  Pulmonary arteries patent.  No evidence of pulmonary embolism.  Scattered ground-glass opacities in both lungs with the appearance of mosaic attenuation.  Bibasilar atelectasis.  Small right pleural effusion.  No segmental consolidation, pneumothorax or left pleural effusion.  No acute osseous findings.  Review of the MIP images confirms the above findings.  IMPRESSION: No evidence of pulmonary embolism.  Mildly enlarged central pulmonary arteries.  Enlarged heart with small pericardial and right pleural effusions.  Mild atelectasis at both lung  bases with scattered areas of mosaic attenuation throughout both lungs greater on right, nonspecific; this could reflect small airways disease, active alveolitis, edema, or pulmonary hypertension.   Electronically Signed   By: Ulyses Southward M.D.   On: 08/19/2013 21:05   Ct Abdomen Pelvis W Contrast  08/21/2013   CLINICAL DATA:  Mid to lower abdominal pain. Shortness of breath. Chills. Anemia.  EXAM: CT ABDOMEN AND PELVIS WITH CONTRAST  TECHNIQUE: Multidetector CT imaging of the abdomen and pelvis was performed using the standard protocol following bolus administration of intravenous contrast.  CONTRAST:  80mL OMNIPAQUE IOHEXOL 300 MG/ML  SOLN  COMPARISON:  No priors.  FINDINGS: Lung Bases: Cardiomegaly. Passive subsegmental atelectasis in the left lower lobe. Subsegmental atelectasis or scarring in the right lower lobe.  Abdomen/Pelvis: The appearance of the liver, gallbladder, pancreas, spleen, bilateral adrenal glands and bilateral kidneys is unremarkable. No significant volume of ascites. No pneumoperitoneum. No pathologic distention of small bowel. No definite lymphadenopathy identified within the abdomen or pelvis. Atherosclerosis throughout the abdominal and pelvic vasculature, without evidence of aneurysm or dissection. Normal appendix. The uterus is massively enlarged measuring approximately 16.4 x 10.4 x 14.0, and is markedly heterogeneous with internal areas of both hypervascularity and hypovascularity most compatible with a large fibroid uterus. Ovaries are unremarkable in appearance. Urinary bladder is normal in appearance.  Musculoskeletal: Diffuse body wall edema. Multiple small sclerotic lesions scattered throughout the pelvis bilaterally, favored to represent tiny bone islands.  IMPRESSION: 1. No acute findings in the abdomen or pelvis. 2. However, there is marked enlargement of the uterus, which is suggestive of multifocal fibroids. 3. Diffuse body wall edema. 4. Moderate cardiomegaly. 5.  Additional incidental findings, as above.   Electronically Signed   By: Trudie Reed M.D.   On: 08/21/2013 13:23   Dg Chest Port 1 View  08/21/2013   CLINICAL DATA:  Tachypnea wheezing  EXAM: PORTABLE CHEST - 1 VIEW  COMPARISON:  08/19/2013  FINDINGS: Unchanged cardiopericardial enlargement, including pericardial effusion based on recent CT. There is diffuse interstitial and airspace opacities. Enlarged central vessels. Possible small pleural effusions. No pneumothorax.  IMPRESSION: Cardiopericardial enlargement (including pericardial effusion based on recent CT) with mild pulmonary edema.   Electronically Signed   By: Tiburcio Pea M.D.   On: 08/21/2013 03:47    PHYSICAL EXAM   Patient is oriented to person time and place. Affect is normal. Lungs are clear. Respiratory effort is nonlabored. Cardiac exam reveals S1 and S2. There is no peripheral edema.   TELEMETRY: I have reviewed telemetry today August 25, 2013. There is normal sinus rhythm. On the monitor there is question of variation of the PR interval. There is no diagnostic abnormality. No further workup of the rhythm is needed at this time.   ASSESSMENT AND PLAN:    Acute systolic heart failure    Patient is improved.    Mitral valve regurgitation     I spoke with Dr.  Bensimhon. The plan will be for her to be seen back in the CHF clinic and he will make further decisions about her valvular heart disease.  Hyperkalemia    Potassium is now 4.0. She should continue on her Lasix and spironolactone. We'll hold off on supplemental potassium at this time. Her lab can be checked soon when she seen in the CHF clinic. Creatinine is stabilized 1.2.  Patient is improved. She mentions some lightheadedness this morning. It'll be important to be sure that she's not orthostatic. She is stable and her current medicines, she shows signs of orthostasis. From the cardiology viewpoint no further in-hospital evaluation is needed.  Willa Rough  08/25/2013 7:26 AM

## 2013-08-25 NOTE — Progress Notes (Signed)
Orthostatic blood pressure completed as ordered patient c/o slight "woozyness"/lightheaded ness during, will continue to closely monitor patient. Jencarlos Nicolson, Randall An RN

## 2013-08-26 LAB — CBC
HCT: 39 % (ref 36.0–46.0)
Hemoglobin: 11.1 g/dL — ABNORMAL LOW (ref 12.0–15.0)
MCH: 21.9 pg — ABNORMAL LOW (ref 26.0–34.0)
MCHC: 28.5 g/dL — ABNORMAL LOW (ref 30.0–36.0)
MCV: 77.1 fL — ABNORMAL LOW (ref 78.0–100.0)
RBC: 5.06 MIL/uL (ref 3.87–5.11)
WBC: 11.7 10*3/uL — ABNORMAL HIGH (ref 4.0–10.5)

## 2013-08-26 LAB — TYPE AND SCREEN
Antibody Screen: NEGATIVE
Unit division: 0

## 2013-08-26 LAB — BASIC METABOLIC PANEL
BUN: 25 mg/dL — ABNORMAL HIGH (ref 6–23)
CO2: 31 mEq/L (ref 19–32)
Calcium: 9.5 mg/dL (ref 8.4–10.5)
Creatinine, Ser: 1.75 mg/dL — ABNORMAL HIGH (ref 0.50–1.10)
GFR calc Af Amer: 38 mL/min — ABNORMAL LOW (ref >90)
GFR calc non Af Amer: 33 mL/min — ABNORMAL LOW (ref >90)
Glucose, Bld: 151 mg/dL — ABNORMAL HIGH (ref 70–99)

## 2013-08-26 LAB — ANA: Anti Nuclear Antibody(ANA): NEGATIVE

## 2013-08-26 NOTE — Progress Notes (Signed)
Family Medicine Teaching Service Daily Progress Note Intern Pager: [phone]  Patient name: Stacey Lin Medical record number: [id number] Date of birth: [birth year] Age: 47 y.o. Gender: female  Primary Care Provider: No primary provider on file. Consultants: Cardiology [place] Code Status: Full  Pt Overview and Major Events to Date:  10/31 - Transitioned to PO lasix. Followed by Heart Failure Team. 11/2 - Received 1 unit of pRBCs  Assessment and Plan: Stacey Lin is a 47 y.o. female presenting with progressive SOB/dyspnea, CP, and asymmetrical LE edema. PMH is significant for anemia, dCHF grade II, HTN, CAD, polysubstanceabuse, COPD?   #H/o dCHF grade II with new onset sCHF: Unsure of dry weight, but has diuresed 18L since admission with at least. Weight on admission was 75.5 kg. Current weight is 64.3kg. BNP >6500 on admission  - CHF team rec's: hold po lasix 40mg  bid and spironolactone today - continue coreg 3.125mg  bid, hold for low blood pressure - valvular disease will be evaluated as outpatient, per cardiology recommendations.  - continue ASA 81mg  - Patient will f/u with CHF clinic on Thursday  - Repeat orthostatics today  #AKI: baseline creatinine is around 0.9-1. Creatinine currently elevated at 1.75 and has been trending up. - hold lasix and spironolactone as above  #CAD: CP off and on at home an on admission. Troponins on multiple occasions all neg. 10/29 EKG: unchanged from previous. Hgb A1c 5.7 but very hypertensive and a smoker.  - continue atorvastatin 40  - Smoking cessation counseling  - Monitor HgB (11.1 today)    #HTN: Systolic blood pressure has been in 90s. Last received coreg once on 11/2, skipped last two doses for low blood pressure in systolic 80s. - Con't Lisinopril 5mg  and continue to monitor creatinine - monitor orthostatics - Coreg 3.125 mg BID; hold for low blood pressure  # Hypokalemia: Low during brisk diuresis. Stable today at  4.0.   - continue spironolactone and lisinopril - monitor K on follow up as outpatient  #Right lower leg edema: h/o of edema worse in right than left. H/o of fibroids and enlarged uterus. - diuresis as above  - recommend outpt OB f/u for evaluation of fibroids and effect on vasculature   # Res: Not requiring oxygen - recommend PFTs after discharge  - Tobacco cessation ( no nicotine, unless requested, as only smoking 1-2pd)   # Microcytic anemia (mixed: ACD vs Iron Def) : Hgb: 11.1 on 08/26/2013 s/p 1u pRBCs. Baseline: 9.7-10.8. Received IV ferraheme.  Anemia panel: Iron 16 low, Saturation 4% low, UIBC 418% high Ferritin 6 low. reticulocytes: wnl . Hgb trending up. Repeat ferritin is high (894) and UIBC low (<15) s/p ferraheme. - follow-up AM CBC  # FEN/GI: Tolerating PO  - Heart healthy diet  - SLIV  - Miralax PRN   # Social: h/o substance abuse (pt claims only smoking crack). Clean since ~2012. UDS: neg. ETOH: <11  #Prophylaxis:  - Hep [place] TID   Disposition: pending CHF team recommendations after cardiac MRI   Subjective:  Some light headedness with walking yesterday but has been better today since s/p transfusion. She is able to ambulate with no shortness of breath. No shortness of breath at rest and is able to sleep well without shortness of breath. No other complaints overnight.  Objective: Temp:  [97.4 F (36.3 C)-98.9 F (37.2 C)] 97.9 F (36.6 C) (11/03 0418) Pulse Rate:  [69-103] 89 (11/03 0418) Resp:  [18-20] 20 (11/03 0418) BP: (88-109)/(50-78) 88/60 mmHg (11/03 0754) SpO2:  [  96 %-97 %] 97 % (11/03 0418) Weight:  [141 lb 12.1 oz (64.3 kg)] 141 lb 12.1 oz (64.3 kg) (11/03 0329)  Physical Exam: General: No acute distress, alert and oriented x4, sitting up in bed Cardiovascular: S1S2, RRR, 3/6 systolic murmur  Respiratory: normal work of breathing, clear to auscultation bilaterally, no wheezing or crackles Abdomen: soft, non tender Extremities: no pitting edema,  anterior lower leg tenderness bilaterally  Laboratory:  Recent Labs Lab 08/25/13 0445 08/25/13 2044 08/26/13 0355  WBC 9.7 11.0* 11.7*  HGB 9.1* 10.8* 11.1*  HCT 34.1* 38.2 39.0  PLT 410* 425* 388    Recent Labs Lab 08/19/13 2235  08/24/13 0515 08/25/13 0445 08/26/13 0355  NA 136  < > 136 136 138  K 2.9*  < > 5.0 4.0 3.9  CL 100  < > 98 97 98  CO2 27  < > 30 32 31  BUN 12  < > 16 20 25*  CREATININE 0.97  < > 1.28* 1.23* 1.75*  CALCIUM 8.6  < > 9.3 9.5 9.5  PROT 6.1  --   --   --   --   BILITOT 0.7  --   --   --   --   ALKPHOS 89  --   --   --   --   ALT 20  --   --   --   --   AST 21  --   --   --   --   GLUCOSE 132*  < > 107* 108* 151*  < > = values in this interval not displayed.   Imaging/Diagnostic Tests: No recent testing  Jacquelin Hawking, MD 08/26/2013, 8:53 AM PGY-1, [facility] Family Medicine FPTS Intern pager: [phone], text pages welcome

## 2013-08-26 NOTE — Progress Notes (Signed)
FMTS Attending Note Patient seen and examined by me today, discussed with resident team and I agree with Dr Dennison Nancy note for today.  Patient with bump in serum Cr, will hold furosemide today and recheck in AM.  Appreciate Cardiology/CHF team input for management of CM and diastolic HF.  For cardiac MRI tomorrow. Paula Compton, MD

## 2013-08-26 NOTE — Progress Notes (Signed)
Patient has done well tonight.  Patient's BPs are still slightly soft.  Patient has periodically gone from NSR to Junctional back to NSR throughout the night, rate controlled. Will continue to monitor.

## 2013-08-26 NOTE — Progress Notes (Signed)
CARDIAC REHAB PHASE I   PRE:  Rate/Rhythm: 90 SR    BP: sitting 92/60    SaO2: 95 RA  MODE:  Ambulation: 850 ft   POST:  Rate/Rhythm: 94 SR    BP: sitting 98/70     SaO2: 95 RA  Tolerated well. Sts she feels better after receiving blood last night. Increased distance without c/o. 1020-1040   Elissa Lovett Elmo CES, ACSM 08/26/2013 10:40 AM

## 2013-08-26 NOTE — Progress Notes (Addendum)
Advanced Heart Failure Rounding Note  Subjective:    Stacey Lin is a 47 yo female with a history of anemia, diastolic heart failure, HTN, and polysubstance abuse. Reports she was told at one point she had an irregular heartbeat. Previous ECHO (04/2010) EF 50-55% with grd 1 DD, LVIDd 47mm, mild to mod MR, RA and LA mildly dilated.   ECHO 08/20/13: EF 30-35%, global HK, LVIDd 62 mm, moderate mitral regurg with rheumatic appearing MV , RA and LA severely dilated, mod/severe TR, peak PA 44  Diuresed with IV lasix and transitioned to po lasix 40 mg bid and spironolactone 25 mg daily. Weight down 2 pounds. (26 pounds total)  Denies SOB/Orthopnea.     Objective:   Weight Range:  Vital Signs:   Temp:  [97.4 F (36.3 C)-98.9 F (37.2 C)] 97.9 F (36.6 C) (11/03 0418) Pulse Rate:  [69-103] 89 (11/03 0418) Resp:  [18-20] 20 (11/03 0418) BP: (88-109)/(50-78) 104/71 mmHg (11/03 0930) SpO2:  [96 %-97 %] 97 % (11/03 0418) Weight:  [141 lb 12.1 oz (64.3 kg)] 141 lb 12.1 oz (64.3 kg) (11/03 0329) Last BM Date: 08/25/13  Weight change: Filed Weights   08/24/13 0700 08/25/13 0413 08/26/13 0329  Weight: 143 lb 4.8 oz (65 kg) 141 lb 8.6 oz (64.2 kg) 141 lb 12.1 oz (64.3 kg)    Intake/Output:   Intake/Output Summary (Last 24 hours) at 08/26/13 0940 Last data filed at 08/26/13 0933  Gross per 24 hour  Intake  615.5 ml  Output   2200 ml  Net -1584.5 ml     Physical Exam: General: Well appearing. No resp difficulty; standing beside the bed.   HEENT: normal  Neck: supple. JVP flat.  Carotids 2+ bilat; no bruits. No lymphadenopathy or thryomegaly appreciated.  Cor: PMI nondisplaced. Regular rate & rhythm. No rubs. + 3/6 TR/MR +s3.  Lungs: clear  Abdomen: soft, nontender, nondistended. No hepatosplenomegaly. No bruits or masses. Good bowel sounds.  Extremities: no cyanosis, clubbing, rash, Trace bilateral edema  Neuro: alert & orientedx3, cranial nerves grossly intact. moves all 4  extremities w/o difficulty. Affect pleasant   Telemetry: SR 90s  Labs: Basic Metabolic Panel:  Recent Labs Lab 08/20/13 0905  08/22/13 1630 08/23/13 0457 08/24/13 0515 08/25/13 0445 08/26/13 0355  NA  --   < > 137 138 136 136 138  K  --   < > 4.5 4.8 5.0 4.0 3.9  CL  --   < > 96 99 98 97 98  CO2  --   < > 36* 35* 30 32 31  GLUCOSE  --   < > 103* 148* 107* 108* 151*  BUN  --   < > 10 11 16 20  25*  CREATININE  --   < > 1.09 1.08 1.28* 1.23* 1.75*  CALCIUM  --   < > 9.7 9.7 9.3 9.5 9.5  MG 1.8  --   --   --  2.2  --   --   < > = values in this interval not displayed.  Liver Function Tests:  Recent Labs Lab 08/19/13 2235  AST 21  ALT 20  ALKPHOS 89  BILITOT 0.7  PROT 6.1  ALBUMIN 3.0*   No results found for this basename: LIPASE, AMYLASE,  in the last 168 hours No results found for this basename: AMMONIA,  in the last 168 hours  CBC:  Recent Labs Lab 08/19/13 2235 08/24/13 0515 08/25/13 0445 08/25/13 2044 08/26/13 0355  WBC 9.2 9.5 9.7  11.0* 11.7*  HGB 9.2* 9.0* 9.1* 10.8* 11.1*  HCT 33.2* 33.3* 34.1* 38.2 39.0  MCV 70.8* 73.8* 74.8* 76.1* 77.1*  PLT 415* 409* 410* 425* 388    Cardiac Enzymes:  Recent Labs Lab 08/19/13 2108 08/20/13 0505 08/20/13 0905 08/21/13 0610 08/21/13 1345  TROPONINI <0.30 <0.30 <0.30 <0.30 <0.30    BNP: BNP (last 3 results)  Recent Labs  08/19/13 1754  PROBNP 6945.0*     Other results:    Imaging: No results found.   Medications:     Scheduled Medications: . aspirin EC  81 mg Oral Daily  . atorvastatin  40 mg Oral q1800  . carvedilol  3.125 mg Oral BID WC  . digoxin  0.125 mg Oral Daily  . furosemide  40 mg Oral BID  . heparin  5,000 Units Subcutaneous Q8H  . lisinopril  5 mg Oral Daily  . magnesium sulfate 1 - 4 g bolus IVPB  2 g Intravenous Once  . sodium chloride  3 mL Intravenous Q12H  . spironolactone  12.5 mg Oral Daily    Infusions:    PRN Medications: sodium chloride, acetaminophen,  hydrALAZINE, ondansetron (ZOFRAN) IV, oxyCODONE, sodium chloride   Assessment:   1) Acute systolic heart failure, probable NICM  -EF 30-35% (07/2013)  2) HTN  3) Anemia  4) Polysubstance abuse  5) ? 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 Creatinine trending up. Hold lasix and spironolactone today. Plan for cardiac MRI today to assess for scar  or infiltrative process.   Continue to work with CR.   Length of Stay: 7   [name],AMY 08/26/2013, 9:40 AM  Advanced Heart Failure Team Pager [phone] (M-F; 7a - 4p)  Please contact [facility] for night-coverage after hours (4p -7a ) and weekends on [email or web address]  Patient seen and examined with Tonye Becket, NP. We discussed all aspects of the encounter. I agree with the assessment and plan as stated above.  I have reviewed echo with Drs. Delton See and Quinn and we all agree that cardiomyopathic issue is main issue now and that worsening of valvular disease is secondary process due to LV dilation. Will plan cMRI to assess for scar and infiltrative processes. Given worsened renal function will need to wait until tomorrow for MRI. If has scar burden will need cath.  Agree with holding diuretics today. Recheck BMET in am.    Truman Hayward 1:12 PM

## 2013-08-26 NOTE — Progress Notes (Addendum)
08/26/13 Nursing note Patient with low BP this am 88-90 sbp manually.Marland Kitchen Family practice MD made aware and orders received. Will continue to monitor patient. Patient states she actually "feels a little better than she did this yesterday" not as tired.Reeves Dam, Rogelia Mire

## 2013-08-27 ENCOUNTER — Inpatient Hospital Stay (HOSPITAL_COMMUNITY): Payer: Medicaid Other

## 2013-08-27 DIAGNOSIS — I428 Other cardiomyopathies: Secondary | ICD-10-CM

## 2013-08-27 LAB — CBC
HCT: 36.6 % (ref 36.0–46.0)
MCH: 21.7 pg — ABNORMAL LOW (ref 26.0–34.0)
MCHC: 28.1 g/dL — ABNORMAL LOW (ref 30.0–36.0)
MCV: 77.1 fL — ABNORMAL LOW (ref 78.0–100.0)
Platelets: 368 10*3/uL (ref 150–400)
RDW: 22.3 % — ABNORMAL HIGH (ref 11.5–15.5)
WBC: 9.2 10*3/uL (ref 4.0–10.5)

## 2013-08-27 LAB — UIFE/LIGHT CHAINS/TP QN, 24-HR UR
Albumin, U: DETECTED
Beta, Urine: DETECTED — AB
Free Lambda Lt Chains,Ur: 0.06 mg/dL (ref 0.02–0.67)
Gamma Globulin, Urine: DETECTED — AB

## 2013-08-27 LAB — BASIC METABOLIC PANEL
BUN: 25 mg/dL — ABNORMAL HIGH (ref 6–23)
CO2: 26 mEq/L (ref 19–32)
Calcium: 9.3 mg/dL (ref 8.4–10.5)
Chloride: 99 mEq/L (ref 96–112)
Creatinine, Ser: 1.15 mg/dL — ABNORMAL HIGH (ref 0.50–1.10)

## 2013-08-27 LAB — PROTEIN ELECTROPHORESIS, SERUM
Alpha-2-Globulin: 10.6 % (ref 7.1–11.8)
Beta 2: 3.7 % (ref 3.2–6.5)
Beta Globulin: 8.1 % — ABNORMAL HIGH (ref 4.7–7.2)
Gamma Globulin: 13.9 % (ref 11.1–18.8)
M-Spike, %: NOT DETECTED g/dL
Total Protein ELP: 5.2 g/dL — ABNORMAL LOW (ref 6.0–8.3)

## 2013-08-27 LAB — TROPONIN I: Troponin I: <0.3 ng/mL (ref <0.30)

## 2013-08-27 MED ORDER — GADOBENATE DIMEGLUMINE 529 MG/ML IV SOLN
23.0000 mL | Freq: Once | INTRAVENOUS | Status: AC
  Administered 2013-08-27: 23 mL via INTRAVENOUS

## 2013-08-27 NOTE — Progress Notes (Signed)
Advanced Heart Failure Rounding Note  Subjective:    Stacey Lin is a 47 yo female with a history of anemia, diastolic heart failure, HTN, and polysubstance abuse. Reports she was told at one point she had an irregular heartbeat. Previous ECHO (04/2010) EF 50-55% with grd 1 DD, LVIDd 47mm, mild to mod MR, RA and LA mildly dilated.   ECHO 08/20/13: EF 30-35%, global HK, LVIDd 62 mm, moderate mitral regurg with rheumatic appearing MV , RA and LA severely dilated, mod/severe TR, peak PA 44  Diuresed with IV lasix and transitioned to po lasix 40 mg bid and spironolactone 25 mg daily. Yesterday lasix and spironolactone stopped due to creatinine bump. Weight up 3 pounds. Overall down 22 pounds.   Denies SOB/Orthopnea.     Objective:   Weight Range:  Vital Signs:   Temp:  [97.5 F (36.4 C)-98.8 F (37.1 C)] 97.5 F (36.4 C) (11/04 0358) Pulse Rate:  [75-97] 79 (11/04 0747) Resp:  [18-20] 20 (11/04 0358) BP: (89-120)/(51-71) 120/66 mmHg (11/04 0747) SpO2:  [96 %-99 %] 99 % (11/04 0358) Weight:  [144 lb 6.4 oz (65.5 kg)] 144 lb 6.4 oz (65.5 kg) (11/04 0358) Last BM Date: 08/26/13  Weight change: Filed Weights   08/25/13 0413 08/26/13 0329 08/27/13 0358  Weight: 141 lb 8.6 oz (64.2 kg) 141 lb 12.1 oz (64.3 kg) 144 lb 6.4 oz (65.5 kg)    Intake/Output:   Intake/Output Summary (Last 24 hours) at 08/27/13 0924 Last data filed at 08/27/13 0747  Gross per 24 hour  Intake    720 ml  Output   1125 ml  Net   -405 ml     Physical Exam: General: Well appearing. No resp difficulty;  HEENT: normal  Neck: supple. JVP flat.  Carotids 2+ bilat; no bruits. No lymphadenopathy or thryomegaly appreciated.  Cor: PMI nondisplaced. Regular rate & rhythm. No rubs. + 3/6 TR/MR +s3.  Lungs: clear  Abdomen: soft, nontender, nondistended. No hepatosplenomegaly. No bruits or masses. Good bowel sounds.  Extremities: no cyanosis, clubbing, rash, Trace bilateral edema  Neuro: alert & orientedx3,  cranial nerves grossly intact. moves all 4 extremities w/o difficulty. Affect pleasant   Telemetry: SR 90s  Labs: Basic Metabolic Panel:  Recent Labs Lab 08/23/13 0457 08/24/13 0515 08/25/13 0445 08/26/13 0355 08/27/13 0420  NA 138 136 136 138 136  K 4.8 5.0 4.0 3.9 4.5  CL 99 98 97 98 99  CO2 35* 30 32 31 26  GLUCOSE 148* 107* 108* 151* 170*  BUN 11 16 20  25* 25*  CREATININE 1.08 1.28* 1.23* 1.75* 1.15*  CALCIUM 9.7 9.3 9.5 9.5 9.3  MG  --  2.2  --   --   --     Liver Function Tests: No results found for this basename: AST, ALT, ALKPHOS, BILITOT, PROT, ALBUMIN,  in the last 168 hours No results found for this basename: LIPASE, AMYLASE,  in the last 168 hours No results found for this basename: AMMONIA,  in the last 168 hours  CBC:  Recent Labs Lab 08/24/13 0515 08/25/13 0445 08/25/13 2044 08/26/13 0355 08/27/13 0420  WBC 9.5 9.7 11.0* 11.7* 9.2  HGB 9.0* 9.1* 10.8* 11.1* 10.3*  HCT 33.3* 34.1* 38.2 39.0 36.6  MCV 73.8* 74.8* 76.1* 77.1* 77.1*  PLT 409* 410* 425* 388 368    Cardiac Enzymes:  Recent Labs Lab 08/21/13 0610 08/21/13 1345  TROPONINI <0.30 <0.30    BNP: BNP (last 3 results)  Recent Labs  08/19/13 1754  PROBNP 6945.0*     Other results:    Imaging: No results found.   Medications:     Scheduled Medications: . aspirin EC  81 mg Oral Daily  . atorvastatin  40 mg Oral q1800  . carvedilol  3.125 mg Oral BID WC  . digoxin  0.125 mg Oral Daily  . heparin  5,000 Units Subcutaneous Q8H  . lisinopril  5 mg Oral Daily  . magnesium sulfate 1 - 4 g bolus IVPB  2 g Intravenous Once  . sodium chloride  3 mL Intravenous Q12H    Infusions:    PRN Medications: sodium chloride, acetaminophen, hydrALAZINE, ondansetron (ZOFRAN) IV, oxyCODONE, sodium chloride   Assessment:   1) Acute systolic heart failure, probable NICM  -EF 30-35% (07/2013)  2) HTN  3) Anemia  4) Polysubstance abuse  5) ? COPD  6) Valvular heart disease  with moderate to severe MR/TR 7) Acute kidney injury due to overdiuresis  Plan/Discussion:    Volume status stable. Creatinine trending back down 1.7> 1.1.   Hold lasix and spironolactone today. Start 12.5 mg spironolactone daily and lasix 40 mg daily tomorrow. Plan for cardiac MRI today as renal function improved to assess for scar  or infiltrative process.   Likely home tomorrow. Continue to work with CR.   D/C HF meds Carvedilol 3.125 mg twice a day Lisinopril 5 mg daily Digoxin 0.125 mg daily Spironolactone 12.5 mg daily Lasix 40 mg daily   Length of Stay: 8   Stacey Lin,Stacey Lin 08/27/2013, 9:24 AM  Advanced Heart Failure Team Pager [phone] (M-F; 7a - 4p)  Please contact [facility] for night-coverage after hours (4p -7a ) and weekends on [email or web address]  Patient seen and examined with Stacey Becket, Stacey Lin. We discussed all aspects of the encounter. I agree with the assessment and plan as stated above.   Doing well. Renal function improved. For cMRI today to assess for LV scarring or infiltrative process. Resume diuretics tomorrow  Stacey Lin 12:31 PM

## 2013-08-27 NOTE — Progress Notes (Signed)
CARDIAC REHAB PHASE I   PRE:  Rate/Rhythm: 95SR  BP:  Supine:   Sitting: 88/60  Standing:    SaO2: 96%RA  MODE:  Ambulation: 1040 ft   POST:  Rate/Rhythm: 91SR  BP:  Supine:   Sitting: 96/72  Standing:    SaO2: 92%RA 0952-1021 Pt walked 1040 ft with steady gait. Hand held asst. Tolerated well. No DOE or dizziness. Good spirits today.   Luetta Nutting, RN BSN  08/27/2013 10:17 AM

## 2013-08-27 NOTE — Progress Notes (Addendum)
Family Medicine Teaching Service Daily Progress Note Intern Pager: [phone]  Patient name: Stacey Lin Medical record number: [id number] Date of birth: 10/26/1965 Age: 47 y.o. Gender: female  Primary Care Provider: No primary provider on file. Consultants: Cardiology [place] Code Status: Full  Pt Overview and Major Events to Date:  10/31 - Transitioned to PO lasix. Followed by Heart Failure Team. 11/2 - Received 1 unit of pRBCs  Assessment and Plan: Stacey Lin is a 47 y.o. female presenting with progressive SOB/dyspnea, CP, and asymmetrical LE edema. PMH is significant for anemia, dCHF grade II, HTN, CAD, polysubstanceabuse, COPD?   #H/o dCHF grade II with new onset sCHF: Unsure of dry weight, but has diuresed 18L since admission with at least. Weight on admission was 166lbs. Current weight is 144lbs. BNP >6500 on admission. UOP (last 24 hours): with about -18L total output since admission - CHF team rec's: hold po lasix 40mg  bid and spironolactone - continue coreg 3.125mg  bid, hold for low blood pressure - valvular disease will be evaluated as outpatient, per cardiology recommendations.  - continue ASA 81mg  - Patient will f/u with CHF clinic on Thursday - follow-up cardiac MRI and cardiology recommendations  #AKI: baseline creatinine is around 0.9-1. Creatinine currently elevated at 1.75 and has been trending up. - hold lasix and spironolactone as above  #CAD: CP off and on at home an on admission. Troponins on multiple occasions all neg. 10/29 EKG: unchanged from previous. Hgb A1c 5.7 but very hypertensive and a smoker.  - continue atorvastatin 40  - Smoking cessation counseling  - Monitor HgB (11.1 today)    #HTN: Systolic blood pressure has been in 90s-120s - Con't Lisinopril 5mg  and continue to monitor creatinine - monitor orthostatics - Coreg 3.125 mg BID; hold for low blood pressure  # Hypokalemia: Low during brisk diuresis. Stable today at  4.0.   - continue spironolactone and lisinopril - monitor K on follow up as outpatient  #Right lower leg edema: h/o of edema worse in right than left. H/o of fibroids and enlarged uterus. - diuresis as above  - recommend outpt OB f/u for evaluation of fibroids and effect on vasculature   # Res: Not requiring oxygen - recommend PFTs after discharge  - Tobacco cessation ( no nicotine, unless requested, as only smoking 1-2pd)   # Microcytic anemia (mixed: ACD vs Iron Def) : Hgb: 10.3 on 08/27/2013 s/p 1u pRBCs. Baseline: 9.7-10.8. Received IV ferraheme. Anemia panel: Iron 16 low, Saturation 4% low, UIBC 418% high Ferritin 6 low. reticulocytes: wnl . Hgb trending up. Repeat ferritin is high (894) and UIBC low (<15) s/p ferraheme.  # FEN/GI: Tolerating PO  - Heart healthy diet  - SLIV  - Miralax PRN   # Social: h/o substance abuse (pt claims only smoking crack). Clean since ~2012. UDS: neg. ETOH: <11  #Prophylaxis:  - Hep [place] TID   Disposition: pending CHF team recommendations after cardiac MRI   Subjective:  Some light headedness with walking yesterday but has been better today since s/p transfusion. She is able to ambulate with no shortness of breath. No shortness of breath at rest and is able to sleep well without shortness of breath. No other complaints overnight.  Objective: Temp:  [97.5 F (36.4 C)-98.8 F (37.1 C)] 97.5 F (36.4 C) (11/04 0358) Pulse Rate:  [75-97] 97 (11/04 0358) Resp:  [18-20] 20 (11/04 0358) BP: (88-108)/(51-71) 104/60 mmHg (11/04 0358) SpO2:  [96 %-99 %] 99 % (11/04 0358) Weight:  [65.5 kg (  144 lb 6.4 oz)] 65.5 kg (144 lb 6.4 oz) (11/04 0358)  Physical Exam: General: No acute distress, alert and oriented x4, sitting up in bed Cardiovascular: S1S2, RRR, 3/6 systolic murmur  Respiratory: normal work of breathing, clear to auscultation bilaterally, no wheezing or crackles Abdomen: soft, tender, multiple masses felt Extremities: no pitting edema,  anterior lower leg tenderness bilaterally  Laboratory:  Recent Labs Lab 08/25/13 2044 08/26/13 0355 08/27/13 0420  WBC 11.0* 11.7* 9.2  HGB 10.8* 11.1* 10.3*  HCT 38.2 39.0 36.6  PLT 425* 388 368    Recent Labs Lab 08/25/13 0445 08/26/13 0355 08/27/13 0420  NA 136 138 136  K 4.0 3.9 4.5  CL 97 98 99  CO2 32 31 26  BUN 20 25* 25*  CREATININE 1.23* 1.75* 1.15*  CALCIUM 9.5 9.5 9.3  GLUCOSE 108* 151* 170*   UOP: - in last 24 hours  Imaging/Diagnostic Tests: No recent testing  Jacquelin Hawking, MD 08/27/2013, 6:30 AM PGY-1, [facility] Family Medicine FPTS Intern pager: [phone], text pages welcome

## 2013-08-27 NOTE — Progress Notes (Signed)
08/27/2013 1530 Nursing note  During Pt. Rounds, pt. C/o mid sternal CP starting in mid abdomen and radiating towards sternum and right neck. Pt. States pain in sharp and worse on inspiration. BP 115/74 HR 84 o2 98 % on RA. EKG performed, results of both EKG and QTC readings called to Dr. Caleb Popp. No orders received at this time. Will continue to closely monitor patient.  Keely Drennan, Blanchard Kelch

## 2013-08-28 LAB — BASIC METABOLIC PANEL
CO2: 26 mEq/L (ref 19–32)
Calcium: 9.5 mg/dL (ref 8.4–10.5)
Chloride: 103 mEq/L (ref 96–112)
Creatinine, Ser: 1.03 mg/dL (ref 0.50–1.10)
Glucose, Bld: 88 mg/dL (ref 70–99)
Potassium: 4.6 mEq/L (ref 3.5–5.1)

## 2013-08-28 LAB — DIGOXIN LEVEL: Digoxin Level: 1 ng/mL (ref 0.8–2.0)

## 2013-08-28 MED ORDER — SPIRONOLACTONE 12.5 MG HALF TABLET
12.5000 mg | ORAL_TABLET | Freq: Every day | ORAL | Status: DC
  Administered 2013-08-28: 12.5 mg via ORAL
  Filled 2013-08-28: qty 1

## 2013-08-28 MED ORDER — FUROSEMIDE 40 MG PO TABS: 40.0000 mg | ORAL_TABLET | Freq: Every day | ORAL | Status: DC

## 2013-08-28 MED ORDER — DIGOXIN 125 MCG PO TABS: 0.1250 mg | ORAL_TABLET | Freq: Every day | ORAL | Status: DC

## 2013-08-28 MED ORDER — IBUPROFEN 200 MG PO TABS: 400.0000 mg | ORAL_TABLET | Freq: Four times a day (QID) | ORAL | Status: DC | PRN

## 2013-08-28 MED ORDER — SPIRONOLACTONE 12.5 MG HALF TABLET: 12.5000 mg | ORAL_TABLET | Freq: Every day | ORAL | Status: DC

## 2013-08-28 MED ORDER — LISINOPRIL 5 MG PO TABS: 5.0000 mg | ORAL_TABLET | Freq: Every day | ORAL | Status: DC

## 2013-08-28 MED ORDER — ATORVASTATIN CALCIUM 40 MG PO TABS: 40.0000 mg | ORAL_TABLET | Freq: Every day | ORAL | Status: DC

## 2013-08-28 MED ORDER — FUROSEMIDE 40 MG PO TABS
40.0000 mg | ORAL_TABLET | Freq: Every day | ORAL | Status: DC
  Administered 2013-08-28: 40 mg via ORAL
  Filled 2013-08-28: qty 1

## 2013-08-28 MED ORDER — CARVEDILOL 3.125 MG PO TABS: 3.1250 mg | ORAL_TABLET | Freq: Two times a day (BID) | ORAL | Status: DC

## 2013-08-28 NOTE — Progress Notes (Signed)
  Received a call from resident regarding prolonged PR interval.  Discussed with Dr Gala Romney. Stop digoxin and check stat dig level now.  Keep off digoxin.   [name],AMY 3:57 PM

## 2013-08-28 NOTE — Progress Notes (Signed)
Family Medicine Teaching Service Daily Progress Note Intern Pager: [phone]  Patient name: Stacey Lin Medical record number: [id number] Date of birth: 12/17/1965 Age: 47 y.o. Gender: female  Primary Care Provider: No primary provider on file. Consultants: Cardiology [place] Code Status: Full  Pt Overview and Major Events to Date:  10/31 - Transitioned to PO lasix. Followed by Heart Failure Team. 11/2 - Received 1 unit of pRBCs  Assessment and Plan: Stacey Lin is a 47 y.o. female presenting with progressive SOB/dyspnea, CP, and asymmetrical LE edema. PMH is significant for anemia, dCHF grade II, HTN, CAD, polysubstanceabuse, COPD?   #H/o dCHF grade II with new onset sCHF: Unsure of dry weight, but has diuresed 18L since admission with at least. Weight on admission was 166lbs. Current weight is 144lbs. BNP >6500 on admission. UOP (last 24 hours): with about -18L total output since admission. Cardiac MRI shows LVEF of 32% and RVEF of 25%. No scarring that suggests infiltrative disease, moderate mitral regurgitation, moderate-severe tricuspid regurgitation. - restart po lasix 40mg  bid and spironolactone - continue coreg 3.125mg  bid, hold for low blood pressure - valvular disease will be evaluated as outpatient, per cardiology recommendations.  - continue ASA 81mg  - Patient will f/u with CHF clinic on Thursday - follow-up cardiac MRI and cardiology recommendations - Carvedilol 3.125 mg twice a day, Lisinopril 5 mg daily, Digoxin 0.125 mg daily, Spironolactone 12.5 mg daily, Lasix 40 mg daily on discharge  #AKI: baseline creatinine is around 0.9-1. Creatinine currently at 1.03 and has been trending down. - restart lasix and spironolactone as above  #CAD: CP off and on at home an on admission. Troponins on multiple occasions all neg. 10/29 EKG: unchanged from previous. Hgb A1c 5.7 but very hypertensive and a smoker.  - continue atorvastatin 40   #HTN: Systolic  blood pressure has been in 90s-120s - Con't Lisinopril 5mg  and continue to monitor creatinine - monitor orthostatics - Coreg 3.125 mg BID; hold for low blood pressure  # Hypokalemia: Low during brisk diuresis. Stable today at 4.6.   - continue spironolactone and lisinopril - monitor K on follow up as outpatient  #Right lower leg edema: h/o of edema worse in right than left. H/o of fibroids and enlarged uterus. - diuresis as above  - recommend outpt OB f/u for evaluation of fibroids and effect on vasculature   # Res: Not requiring oxygen - recommend PFTs after discharge  - Tobacco cessation ( no nicotine, unless requested, as only smoking 1-2pd)   # Microcytic anemia (mixed: ACD vs Iron Def) : Hgb: 10.3 on 08/27/2013 s/p 1u pRBCs. Baseline: 9.7-10.8. Received IV ferraheme. Anemia panel: Iron 16 low, Saturation 4% low, UIBC 418% high Ferritin 6 low. reticulocytes: wnl . Hgb trending up. Repeat ferritin is high (894) and UIBC low (<15) s/p ferraheme.  # FEN/GI: Tolerating PO  - Heart healthy diet  - SLIV  - Miralax PRN   # Social: h/o substance abuse (pt claims only smoking crack). Clean since ~2012. UDS: neg. ETOH: <11  #Prophylaxis:  - Hep [place] TID   Disposition: Discharge home today   Subjective:  No events overnight. She has been comfortable with no shortness of breath  Objective: Temp:  [97.3 F (36.3 C)-98.2 F (36.8 C)] 98.2 F (36.8 C) (11/05 0432) Pulse Rate:  [81-98] 83 (11/05 0928) Resp:  [18-20] 20 (11/05 0432) BP: (108-128)/(64-84) 115/65 mmHg (11/05 0928) SpO2:  [98 %-100 %] 100 % (11/05 0432) Weight:  [65.998 kg (145 lb 8 oz)] 65.998  kg (145 lb 8 oz) (11/05 0432)  Physical Exam: General: No acute distress, alert and oriented x4, laying in bed Cardiovascular: S1S2, RRR, 3/6 systolic murmur  Respiratory: normal work of breathing, clear to auscultation bilaterally, no wheezing or crackles Abdomen: soft, tender, multiple masses felt Extremities: no pitting  edema, anterior lower leg tenderness bilaterally  Laboratory:  Recent Labs Lab 08/25/13 2044 08/26/13 0355 08/27/13 0420  WBC 11.0* 11.7* 9.2  HGB 10.8* 11.1* 10.3*  HCT 38.2 39.0 36.6  PLT 425* 388 368    Recent Labs Lab 08/26/13 0355 08/27/13 0420 08/28/13 0454  NA 138 136 137  K 3.9 4.5 4.6  CL 98 99 103  CO2 31 26 26   BUN 25* 25* 18  CREATININE 1.75* 1.15* 1.03  CALCIUM 9.5 9.3 9.5  GLUCOSE 151* 170* 88   UOP: - in last 24 hours  Imaging/Diagnostic Tests: No recent testing  Jacquelin Hawking, MD 08/28/2013, 10:23 AM PGY-1, [place] [facility] FPTS Intern pager: [phone], text pages welcome   FMTS Attending Note Patient seen and examined by me today on day of discharge; discussed with resident team and I agree with Dr Dennison Nancy assessment and plan per the note.  Paula Compton, MD

## 2013-08-28 NOTE — Progress Notes (Signed)
Advanced Heart Failure Rounding Note  Subjective:    Stacey Lin is a 47 yo female with a history of anemia, diastolic heart failure, HTN, and polysubstance abuse. Reports she was told at one point she had an irregular heartbeat. Previous ECHO (04/2010) EF 50-55% with grd 1 DD, LVIDd 47mm, mild to mod MR, RA and LA mildly dilated.   ECHO 08/20/13: EF 30-35%, global HK, LVIDd 62 mm, moderate mitral regurg with rheumatic appearing MV , RA and LA severely dilated, mod/severe TR, peak PA 44  Diuresed with IV lasix and transitioned to po lasix 40 mg bid and spironolactone 25 mg daily. Lasix and spironolactone stopped due to creatinine bump. Creatinine back down and she had cardiac MRI. Weight up 1 pounds. Overall down 21 pounds.   Cardiac MRI -EF 32% RV mildly dilated No scar or infiltrative disease. Mod MR Mod-Sever TR.   Creatinine 1.7>1.1>1.0   Complaining of abdominal discomfort. Denies SOB/Orthopnea.     Objective:   Weight Range:  Vital Signs:   Temp:  [97.3 F (36.3 C)-98.2 F (36.8 C)] 98.2 F (36.8 C) (11/05 0432) Pulse Rate:  [81-98] 98 (11/05 0432) Resp:  [18-20] 20 (11/05 0432) BP: (108-128)/(64-84) 128/84 mmHg (11/05 0432) SpO2:  [98 %-100 %] 100 % (11/05 0432) Weight:  [145 lb 8 oz (65.998 kg)] 145 lb 8 oz (65.998 kg) (11/05 0432) Last BM Date: 08/27/13  Weight change: Filed Weights   08/26/13 0329 08/27/13 0358 08/28/13 0432  Weight: 141 lb 12.1 oz (64.3 kg) 144 lb 6.4 oz (65.5 kg) 145 lb 8 oz (65.998 kg)    Intake/Output:   Intake/Output Summary (Last 24 hours) at 08/28/13 0807 Last data filed at 08/28/13 0700  Gross per 24 hour  Intake    720 ml  Output   1020 ml  Net   -300 ml     Physical Exam: General: Well appearing. No resp difficulty;  HEENT: normal  Neck: supple. JVP 6-7.  Carotids 2+ bilat; no bruits. No lymphadenopathy or thryomegaly appreciated.  Cor: PMI nondisplaced. Regular rate & rhythm. No rubs. + 3/6 TR/MR +s3.  Lungs: clear   Abdomen: soft, nontender, nondistended. No hepatosplenomegaly. No bruits or masses. Good bowel sounds.  Extremities: no cyanosis, clubbing, rash, no edema  Neuro: alert & orientedx3, cranial nerves grossly intact. moves all 4 extremities w/o difficulty. Affect pleasant   Telemetry: SR 90s  Labs: Basic Metabolic Panel:  Recent Labs Lab 08/24/13 0515 08/25/13 0445 08/26/13 0355 08/27/13 0420 08/27/13 1720 08/28/13 0454  NA 136 136 138 136  --  137  K 5.0 4.0 3.9 4.5  --  4.6  CL 98 97 98 99  --  103  CO2 30 32 31 26  --  26  GLUCOSE 107* 108* 151* 170*  --  88  BUN 16 20 25* 25*  --  18  CREATININE 1.28* 1.23* 1.75* 1.15*  --  1.03  CALCIUM 9.3 9.5 9.5 9.3  --  9.5  MG 2.2  --   --   --  2.3  --     Liver Function Tests: No results found for this basename: AST, ALT, ALKPHOS, BILITOT, PROT, ALBUMIN,  in the last 168 hours No results found for this basename: LIPASE, AMYLASE,  in the last 168 hours No results found for this basename: AMMONIA,  in the last 168 hours  CBC:  Recent Labs Lab 08/24/13 0515 08/25/13 0445 08/25/13 2044 08/26/13 0355 08/27/13 0420  WBC 9.5 9.7 11.0* 11.7* 9.2  HGB 9.0* 9.1* 10.8* 11.1* 10.3*  HCT 33.3* 34.1* 38.2 39.0 36.6  MCV 73.8* 74.8* 76.1* 77.1* 77.1*  PLT 409* 410* 425* 388 368    Cardiac Enzymes:  Recent Labs Lab 08/21/13 1345 08/27/13 1720 08/27/13 2240  TROPONINI <0.30 <0.30 <0.30    BNP: BNP (last 3 results)  Recent Labs  08/19/13 1754  PROBNP 6945.0*     Other results:    Imaging: Mr Card Morphology Wo/w Cm  08/27/2013   CLINICAL DATA:  47 year old female with h/o polysubstance abuse and rheumatic heart disease with new cardiomyopathy of unknown etiology.  EXAM: CARDIAC MRI  TECHNIQUE: The patient was scanned on a 1.5 Tesla GE magnet. A dedicated cardiac coil was used. Functional imaging was done using Fiesta sequences. 2,3, and 4 chamber views were done to assess for RWMA's. Modified Simpson's rule using a  short axis stack was used to calculate an ejection fraction on a dedicated work [employer]. The patient received 30 cc of Multihance. After 10 minutes inversion recovery sequences were used to assess for infiltration and scar tissue.  CONTRAST:  30 cc  of Multihance  FINDINGS: 1. Left ventricle is moderately dilated with mild concentric hypertrophy. LVEF is moderately to severely decreased (LVEF 32%). There is mild diffuse hypokinesis with moderate hypokinesis in the mid and basal anterior and mid anteroseptal and basal inferoseptal walls. Basal anteroseptal wall is akinetic.  The measurements are as follows:  LVEDV 163 ml  LVESV 111 ml  SV 53 ml  CO 4.15 L/min  HR 79 BPM  Myocardial mass 211 g  2. Right ventricle is mildly dilated with moderately decreased function (RVEF 25%). There is D shape of the interventricular septum suggesting RV volume overload.  The measurements are as follows:  RVEDV 211 ml  RVESV 158 ml  SV 53 ml  RVCO 4.16 L/min  HR 79 BPM  3. There is no late gadolinium enhancement suggestive of prior scar or an infiltrative disease.  4. Left atrium is moderately dilated. Right atrium is severely dilated.  5. There are at least two centrally directed jets resulting in moderate mitral regurgitation. There is moderate to severe tricuspid regurgitation. Aortic valve is trileaflet. There is moderate aortic insufficiency with a central jet. Ascending aorta is not dilated measuring 29 mm.  IMPRESSION: 1. Moderately dilated LV with mild concentric LVH and moderate to severe LV dysfunction (LVEF 32%).  2. Mildly dilated RV with moderate RV dysfunction (RVEF 25%). RV volume overload.  3. No late gadolinium enhancement suggestive of prior scar or an infiltrative disease.  4.  Moderate left atrial and severe right atrial dilatation.  5. Moderate AI. Moderate MR, predominantly functional. Moderate to severe TR.  Tobias Alexander   Electronically Signed   By: Tobias Alexander   On: 08/27/2013  18:06     Medications:     Scheduled Medications: . aspirin EC  81 mg Oral Daily  . atorvastatin  40 mg Oral q1800  . carvedilol  3.125 mg Oral BID WC  . digoxin  0.125 mg Oral Daily  . heparin  5,000 Units Subcutaneous Q8H  . lisinopril  5 mg Oral Daily  . magnesium sulfate 1 - 4 g bolus IVPB  2 g Intravenous Once  . sodium chloride  3 mL Intravenous Q12H    Infusions:    PRN Medications: sodium chloride, acetaminophen, hydrALAZINE, oxyCODONE, sodium chloride   Assessment:   1) Acute systolic heart failure, probable NICM  -EF 30-35% (  07/2013)  2) HTN  3) Anemia  4) Polysubstance abuse  5) ? 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 Creatinine stable. Start lasix 40 mg daily and 12.5 mg spironolactone daily.Continue all other HF meds.   Cardiac MRI EF 32% no scar or evidence of infiltrative disease. Home today. Referred to paramedicine.  Will need 30 day supply of medications. Follow up in HF clinic next week- appointment made. Will repeat BMET and check dig level at that time.   Reinforced daily weights, low salt food choices, medication compliance and limiting fluid intake to < 2 liters per day.   D/C HF meds Carvedilol 3.125 mg twice a day Lisinopril 5 mg daily Digoxin 0.125 mg daily Spironolactone 12.5 mg daily Lasix 40 mg daily   Length of Stay: 9   [name],AMYNP-C 08/28/2013, 8:07 AM  Advanced Heart Failure Team Pager [phone] (M-F; 7a - 4p)  Please contact [facility] for night-coverage after hours (4p -7a ) and weekends on [email or web address]   Patient seen and examined with Tonye Becket, NP. We discussed all aspects of the encounter. I agree with the assessment and plan as stated above. MRI reviewed. It appears that primary process is cardiomyopathic in nature (dilated LV) and not primarily valvular. Agree with medical therapy. Can d/c home today on  above medical regimen. Will see back in HF Clinic next week. Paramedicine f/u also arranged. Repeat echo 2-3 months.  Daniel Bensimhon,MD 12:29 PM

## 2013-08-28 NOTE — Progress Notes (Signed)
08/28/2013 6:44 PM Nursing note Discharge avs form, medications already taken today and those due this evening given and explained to patient. Rx given and explained to patient as well as well as location of one called in rx. Follow up appointments, when to call MD reviewed. HF education booklet reviewed as well. Daily weights, how to check a pulse and blood pressure also reviewed. Iv line d/c earlier in shift by NT. D/c tele. D/c home per verbal order Dr. Caleb Popp. Questions and concerns addressed.  Stacey Lin, Blanchard Kelch

## 2013-08-28 NOTE — Progress Notes (Signed)
08/28/2013 1400 MD paged and made aware of PR interval 0.36. No new orders at this time. MD to review pt. Chart. Will continue to monitor patient.  Jovan Colligan, Blanchard Kelch

## 2013-08-28 NOTE — Progress Notes (Signed)
08/28/2013 11:02 AM nursing note  Confirmed with CCMD pt. Tachy HR alarms being received by RN are false due to ECG counting QRS and T wave in HR calculations. PT. ECG electrode pads and batteries changed per protocol to help aid with this issue.  [place], Blanchard Kelch

## 2013-08-29 ENCOUNTER — Encounter (HOSPITAL_COMMUNITY): Payer: Self-pay

## 2013-09-02 ENCOUNTER — Encounter: Payer: Self-pay | Admitting: *Deleted

## 2013-09-02 ENCOUNTER — Ambulatory Visit (HOSPITAL_COMMUNITY)
Admit: 2013-09-02 | Discharge: 2013-09-02 | Disposition: A | Discharge status: Home / Self Care | Payer: Self-pay | Source: Ambulatory Visit | Attending: Internal Medicine | Admitting: Internal Medicine

## 2013-09-02 ENCOUNTER — Encounter: Payer: Self-pay | Admitting: Internal Medicine

## 2013-09-02 VITALS — BP 112/80 | HR 93 | Wt 143.8 lb

## 2013-09-02 DIAGNOSIS — I5022 Chronic systolic (congestive) heart failure: Secondary | ICD-10-CM | POA: Insufficient documentation

## 2013-09-02 DIAGNOSIS — I10 Essential (primary) hypertension: Secondary | ICD-10-CM | POA: Insufficient documentation

## 2013-09-02 MED ORDER — CARVEDILOL 3.125 MG PO TABS: ORAL_TABLET | ORAL | Status: DC

## 2013-09-02 NOTE — Progress Notes (Signed)
Patient ID: Jee Soppe, female   DOB: 06/10/1966, 47 y.o.   MRN: [id number]  PCP: [facility]  HPI: Ms. Dou is a 47 yo female with a history of anemia, combined systolic/diastolic heart failure, HTN, and polysubstance abuse. Previous ECHO (04/2010) EF 50-55% with grd 1 DD, LVIDd 47mm, mild to mod MR, RA and LA mildly dilated.   Admitted to the hospital 08/19/13-08/29/13 with A/C systolic HF. Discharge weight 144 lbs.  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 Mod MR and Mod-sev TR  Post hospital follow up: Recently admitted to the hospital for A/C HF. Doing well. Taking medications as prescribed. Denies SOB, orthopnea, or CP. Mild dizziness with getting up in the morning. Weight at home stable 143-144 lbs. Paramedicine program, Tammy Sours following patient. Complaints of belly pain, thinks maybe fibroids.   ROS: All systems negative except as listed in HPI, PMH and Problem List.  Past Medical History  Diagnosis Date  . Hypertension   . Anemia   . Arrhythmia     Current Outpatient Prescriptions  Medication Sig Dispense Refill  . albuterol (VENTOLIN HFA) 108 (90 BASE) MCG/ACT inhaler Inhale 2 puffs into the lungs 4 (four) times daily as needed for wheezing.       Marland Kitchen atorvastatin (LIPITOR) 40 MG tablet Take 1 tablet (40 mg total) by mouth daily at 6 PM.  30 tablet  0  . carvedilol (COREG) 3.125 MG tablet Take 1 tablet (3.125 mg total) by mouth 2 (two) times daily with a meal.  60 tablet  0  . diphenhydramine-acetaminophen (TYLENOL PM) 25-500 MG TABS Take 2 tablets by mouth at bedtime as needed (for pain).      . furosemide (LASIX) 40 MG tablet Take 1 tablet (40 mg total) by mouth daily.  30 tablet  0  . lisinopril (PRINIVIL,ZESTRIL) 5 MG tablet Take 1 tablet (5 mg total) by  mouth daily.  30 tablet  0  . spironolactone (ALDACTONE) 12.5 mg TABS tablet Take 0.5 tablets (12.5 mg total) by mouth daily.  15 tablet  0   No current facility-administered medications for this encounter.    Filed Vitals:   09/02/13 1108  BP: 112/80  Pulse: 93  Weight: 143 lb 12.8 oz (65.227 kg)  SpO2: 100%    PHYSICAL EXAM: General:  Well appearing. No resp difficulty HEENT: normal Neck: supple. JVP flat. Carotids 2+ bilaterally; no bruits. No lymphadenopathy or thryomegaly appreciated. Cor: PMI normal. Regular rate & rhythm. No rubs, gallops or murmurs. Lungs: clear Abdomen: soft, tender, nondistended. No hepatosplenomegaly. No bruits or masses. Good bowel sounds. Extremities: no cyanosis, clubbing, rash, edema Neuro: alert & orientedx3, cranial nerves grossly intact. Moves all 4 extremities w/o difficulty. Affect pleasant.  ASSESSMENT & PLAN:  1) Chronic systolic HF, EF 30-35%, NICM - NYHA II symptoms and volume status stable. Will continue lasix 40 mg daily. - Will increase coreg to 3.125/6.25 mg. Instructed to call the clinic if she notices any increase in fatigue. - Continue Spiro and ACE-I at current doses. Will get BMET today with Guide It trial. - Will continue medication titration with plan for repeat ECHO in 3 months. If EF remains below 35% will need to consider referral to EP for ICD. QRS 96 msec not candidate for CRT-D - Reinforced the need and importance of  daily weights, a low sodium diet, and fluid restriction (less than 2 L a day). Instructed to call the HF clinic if weight increases more than 3 lbs overnight or 5 lbs in a week.   2) HTN - Controlled on spiro, BB and ACE-I. Increase coreg as above for LV dysfunction.  Follow up in 2-3 weeks. Ulla Potash B NP-C 9:16 PM

## 2013-09-02 NOTE — Progress Notes (Unsigned)
Guide IT Study Guide IT Research Study was

## 2013-09-02 NOTE — Progress Notes (Unsigned)
Guide IT Research Study   The Guide IT Research Study was explained to the subject and the subject's questions were answered.The subject agreed to participate In the Guide IT Research Study and signed and dated the inform consent form. The informed consent was obtained prior to the performance of any protocol-specific procedures for the subject. The subject was given a copy of the informed consent form.  Loletha Carrow RN 09/02/2013 11:30

## 2013-09-02 NOTE — Patient Instructions (Signed)
Increase Carvedilol to 3.125 mg in AM and 6.25 mg (2 tabs) in PM  Your physician recommends that you schedule a follow-up appointment in: 2-3 weeks  Do the following things EVERYDAY: 1) Weigh yourself in the morning before breakfast. Write it down and keep it in a log. 2) Take your medicines as prescribed 3) Eat low salt foods-Limit salt (sodium) to 2000 mg per day.  4) Stay as active as you can everyday 5) Limit all fluids for the day to less than 2 liters

## 2013-09-03 DIAGNOSIS — I5022 Chronic systolic (congestive) heart failure: Secondary | ICD-10-CM | POA: Insufficient documentation

## 2013-09-06 ENCOUNTER — Other Ambulatory Visit (HOSPITAL_COMMUNITY): Payer: Self-pay | Admitting: Cardiology

## 2013-09-06 DIAGNOSIS — I509 Heart failure, unspecified: Secondary | ICD-10-CM

## 2013-09-06 MED ORDER — CARVEDILOL 3.125 MG PO TABS: ORAL_TABLET | ORAL | Status: DC

## 2013-09-06 NOTE — Telephone Encounter (Signed)
With increase in dose. Pt is about to run out of meds rx refill sent to [facility] even though she uses [facility]- pt will try to get meds for $4

## 2013-09-23 ENCOUNTER — Encounter (HOSPITAL_COMMUNITY): Payer: Self-pay

## 2013-09-23 ENCOUNTER — Ambulatory Visit (HOSPITAL_COMMUNITY)
Admission: RE | Admit: 2013-09-23 | Discharge: 2013-09-23 | Disposition: A | Discharge status: Home / Self Care | Payer: Medicaid Other | Source: Ambulatory Visit | Attending: Internal Medicine | Admitting: Internal Medicine

## 2013-09-23 VITALS — BP 114/76 | HR 85 | Wt 145.1 lb

## 2013-09-23 DIAGNOSIS — I340 Nonrheumatic mitral (valve) insufficiency: Secondary | ICD-10-CM

## 2013-09-23 DIAGNOSIS — I5022 Chronic systolic (congestive) heart failure: Secondary | ICD-10-CM | POA: Insufficient documentation

## 2013-09-23 DIAGNOSIS — I10 Essential (primary) hypertension: Secondary | ICD-10-CM | POA: Insufficient documentation

## 2013-09-23 DIAGNOSIS — I059 Rheumatic mitral valve disease, unspecified: Secondary | ICD-10-CM | POA: Insufficient documentation

## 2013-09-23 DIAGNOSIS — D649 Anemia, unspecified: Secondary | ICD-10-CM | POA: Insufficient documentation

## 2013-09-23 DIAGNOSIS — I509 Heart failure, unspecified: Secondary | ICD-10-CM | POA: Insufficient documentation

## 2013-09-23 DIAGNOSIS — I428 Other cardiomyopathies: Secondary | ICD-10-CM | POA: Insufficient documentation

## 2013-09-23 MED ORDER — CARVEDILOL 6.25 MG PO TABS: ORAL_TABLET | ORAL | Status: DC

## 2013-09-23 MED ORDER — LISINOPRIL 5 MG PO TABS: 5.0000 mg | ORAL_TABLET | Freq: Two times a day (BID) | ORAL | Status: DC

## 2013-09-23 NOTE — Progress Notes (Signed)
Patient ID: Stacey Lin, female   DOB: [birth year], 47 y.o.   MRN: [id number]  PCP: [facility]  HPI: Stacey Lin is a 47 yo female with a history of anemia, combined systolic/diastolic heart failure, HTN, and polysubstance abuse. Previous ECHO (04/2010) EF 50-55% with grd 1 DD, LVIDd 47mm, mild to mod MR, RA and LA mildly dilated.   Admitted to the hospital 08/19/13-08/29/13 with A/C systolic HF. Discharge weight 144 lbs.  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 Mod MR and Mod-sev TR  Follow up: Last visit increased coreg to 3.125/6.25. Doing well all her zones have been green. Walked to the bus stop today with no stopping for SOB. Taking medications as prescribed. Denies CP, DOE or edema. Able to go upstairs with no issues. Dizziness improved with getting up slowly. Weight at home 147-148 lbs. Paramedicine program, Tammy Sours following patient.   ROS: All systems negative except as listed in HPI, PMH and Problem List.  Past Medical History  Diagnosis Date  . Hypertension   . Anemia   . Arrhythmia     Current Outpatient Prescriptions  Medication Sig Dispense Refill  . albuterol (VENTOLIN HFA) 108 (90 BASE) MCG/ACT inhaler Inhale 2 puffs into the lungs 4 (four) times daily as needed for wheezing.       Marland Kitchen atorvastatin (LIPITOR) 40 MG tablet Take 1 tablet (40 mg total) by mouth daily at 6 PM.  30 tablet  0  . carvedilol (COREG) 3.125 MG tablet Take 1 tab in AM and 2 tabs in PM  90 tablet  3  . diphenhydramine-acetaminophen (TYLENOL PM) 25-500 MG TABS Take 2 tablets by mouth at bedtime as needed (for pain).      . furosemide (LASIX) 40 MG tablet Take 1 tablet (40 mg total) by mouth daily.  30 tablet  0  . lisinopril (PRINIVIL,ZESTRIL) 5 MG tablet Take 1 tablet (5 mg total) by  mouth daily.  30 tablet  0  . spironolactone (ALDACTONE) 12.5 mg TABS tablet Take 0.5 tablets (12.5 mg total) by mouth daily.  15 tablet  0   No current facility-administered medications for this encounter.    Filed Vitals:   09/23/13 1100  BP: 114/76  Pulse: 85  Weight: 145 lb 1.9 oz (65.826 kg)  SpO2: 100%    PHYSICAL EXAM: General:  Well appearing. No resp difficulty HEENT: normal Neck: supple. JVP flat. Carotids 2+ bilaterally; no bruits. No lymphadenopathy or thryomegaly appreciated. Cor: PMI normal. Regular rate & rhythm. No rubs, gallops or murmurs. Lungs: clear Abdomen: soft, tender, nondistended. No hepatosplenomegaly. No bruits or masses. Good bowel sounds. Extremities: no cyanosis, clubbing, rash, edema Neuro: alert & orientedx3, cranial nerves grossly intact. Moves all 4 extremities w/o difficulty. Affect pleasant.  ASSESSMENT & PLAN:  1) Chronic systolic HF, EF 30-35%, NICM (Cardiac MRI EF 32%) - NYHA II symptoms and volume status stable. Will continue lasix 40 mg daily. She reports she has been only in green zone's since last visit. - Will increase coreg to 6.25 mg BID. Have refilled her pill boxes for her.  Instructed to call the clinic if she notices any increase in fatigue. - Increase lisinopril to 5 mg BID. Check BMET today with Guide-it and then 10/07/13 with next visit. Continue spiro at current dose. - Repeat ECHO in  January and if it remains less than 35% refer to EP for ICD for primary prevention.  - Reinforced the need and importance of daily weights, a low sodium diet, and fluid restriction (less than 2 L a day). Instructed to call the HF clinic if weight increases more than 3 lbs overnight or 5 lbs in a week.  2) HTN - Controlled on spiro, BB and ACE-I. Increase coreg and ACE-I as above for LV dysfunction.   Follow up in 2 weeks Ulla Potash B NP-C 11:15 AM  Patient seen and examined with Ulla Potash, NP. We discussed all aspects of the encounter. I  agree with the assessment and plan as stated above. She continues to improve. Now NYHA II.  Volume status looks good. Agree with med changes as above.  Will need repeat ECHO in January. Reinforced need for daily weights and reviewed use of sliding scale diuretics.  Breydan Shillingburg,MD 3:07 PM

## 2013-09-23 NOTE — Patient Instructions (Signed)
Increase your lisinopril to 5 mg twice a day.  Increase your coreg to 6.25 mg (2 tablets) twice a day. Once you run out of your medication your new prescription will be for a 6.25 mg tablet and then you will only take 1 tablet twice a day.   Doing great.  Follow up 3 weeks.  Do the following things EVERYDAY: 1) Weigh yourself in the morning before breakfast. Write it down and keep it in a log. 2) Take your medicines as prescribed 3) Eat low salt foods-Limit salt (sodium) to 2000 mg per day.  4) Stay as active as you can everyday 5) Limit all fluids for the day to less than 2 liters

## 2013-09-27 ENCOUNTER — Encounter (HOSPITAL_COMMUNITY): Payer: Self-pay

## 2013-10-03 ENCOUNTER — Ambulatory Visit: Payer: Medicaid Other | Attending: Internal Medicine | Admitting: Internal Medicine

## 2013-10-03 VITALS — BP 110/65 | HR 81 | Temp 98.1°F | Resp 14 | Ht 60.0 in | Wt 148.8 lb

## 2013-10-03 DIAGNOSIS — I10 Essential (primary) hypertension: Secondary | ICD-10-CM

## 2013-10-03 DIAGNOSIS — J449 Chronic obstructive pulmonary disease, unspecified: Secondary | ICD-10-CM

## 2013-10-03 DIAGNOSIS — Z0000 Encounter for general adult medical examination without abnormal findings: Secondary | ICD-10-CM | POA: Insufficient documentation

## 2013-10-03 DIAGNOSIS — D259 Leiomyoma of uterus, unspecified: Secondary | ICD-10-CM

## 2013-10-03 DIAGNOSIS — I5022 Chronic systolic (congestive) heart failure: Secondary | ICD-10-CM

## 2013-10-03 DIAGNOSIS — F17200 Nicotine dependence, unspecified, uncomplicated: Secondary | ICD-10-CM

## 2013-10-03 DIAGNOSIS — I509 Heart failure, unspecified: Secondary | ICD-10-CM

## 2013-10-03 DIAGNOSIS — I340 Nonrheumatic mitral (valve) insufficiency: Secondary | ICD-10-CM

## 2013-10-03 MED ORDER — TRAMADOL HCL 50 MG PO TABS: 50.0000 mg | ORAL_TABLET | Freq: Four times a day (QID) | ORAL | Status: DC | PRN

## 2013-10-03 NOTE — Progress Notes (Unsigned)
Patient ID: Stacey Lin, female   DOB: 04-Jan-1966, 47 y.o.   MRN: [id number] Patient Demographics  Stacey Lin, is a 47 y.o. female  ZTI:[id number]  ASN:[id number]  DOB - 1966/02/23  Chief Complaint  Patient presents with  . Establish Care        Subjective:   Stacey Lin today is here to establish primary care.  The patient is a 47 year old female with history of anemia, combined systolic/diastolic CHF, hypertension, uterine fibroids presented to the clinic for establishing care. Patient is under the care of Dr. Gala Romney for CHF. Patient reports that she is compliant with all her medications and instructions from Dr. Gala Romney. The only complaint she has is that she has off and on intermittent pelvic pain, metromenorrhagia ( twice/month), she has a history of multiple uterine fibroids. She reports that she was taking Tylenol and ibuprofen which has not helped.   Patient has No headache, No chest pain,  No Nausea, No new weakness tingling or numbness, No Cough - SOB.  Objective:    Filed Vitals:   10/03/13 1439  BP: 110/65  Pulse: 81  Temp: 98.1 F (36.7 C)  TempSrc: Oral  Resp: 14  Height: 5' (1.524 m)  Weight: 148 lb 12.8 oz (67.495 kg)  SpO2: 97%     ALLERGIES:  No Known Allergies  PAST MEDICAL HISTORY: Past Medical History  Diagnosis Date  . Hypertension   . Anemia   . Arrhythmia     PAST SURGICAL HISTORY: History reviewed. No pertinent past surgical history.  FAMILY HISTORY: Family History  Problem Relation Age of Onset  . Hypertension Mother   . Hypertension Father   . Hypertension Maternal Grandmother     MEDICATIONS AT HOME: Prior to Admission medications   Medication Sig Start Date End Date Taking? Authorizing Provider  albuterol (VENTOLIN HFA) 108 (90 BASE) MCG/ACT inhaler Inhale 2 puffs into the lungs 4 (four) times daily as needed for wheezing.    Yes Historical Provider, MD  atorvastatin (LIPITOR) 40 MG  tablet Take 1 tablet (40 mg total) by mouth daily at 6 PM. 08/28/13  Yes Jacquelin Hawking, MD  carvedilol (COREG) 3.125 MG tablet Take 3.125 mg by mouth 2 (two) times daily with a meal.   Yes Historical Provider, MD  carvedilol (COREG) 6.25 MG tablet Take 1 tab in AM and 2 tabs in PM 09/23/13  Yes Aundria Rud, NP  furosemide (LASIX) 40 MG tablet Take 1 tablet (40 mg total) by mouth daily. 08/28/13  Yes Jacquelin Hawking, MD  lisinopril (PRINIVIL,ZESTRIL) 5 MG tablet Take 1 tablet (5 mg total) by mouth 2 (two) times daily. 09/23/13  Yes Aundria Rud, NP  spironolactone (ALDACTONE) 12.5 mg TABS tablet Take 0.5 tablets (12.5 mg total) by mouth daily. 08/28/13  Yes Jacquelin Hawking, MD  diphenhydramine-acetaminophen (TYLENOL PM) 25-500 MG TABS Take 2 tablets by mouth at bedtime as needed (for pain).    Historical Provider, MD  traMADol (ULTRAM) 50 MG tablet Take 1 tablet (50 mg total) by mouth every 6 (six) hours as needed for moderate pain. 10/03/13   Roseana Rhine Jenna Luo, MD    REVIEW OF SYSTEMS:  Constitutional:   No   Fevers, chills, fatigue.  HEENT:    No headaches, Sore throat,   Cardio-vascular: No chest pain,  Orthopnea, swelling in lower extremities, anasarca, palpitations  GI:  No abdominal pain, nausea, vomiting, diarrhea  Resp: No shortness of breath,  No coughing up of blood.No cough.No wheezing.  Skin:  no  rash or lesions.  GU:  no dysuria, change in color of urine, no urgency or frequency.  No flank pain.  Musculoskeletal: No joint pain or swelling.  No decreased range of motion.  No back pain.  Psych: No change in mood or affect. No depression or anxiety.  No memory loss.   Exam  General appearance :Awake, alert, NAD, Speech Clear. HEENT: Atraumatic and Normocephalic, PERLA Neck: supple, no JVD. No cervical lymphadenopathy.  Chest: clear to auscultation bilaterally, no wheezing, rales or rhonchi CVS: S1 S2 regular, no murmurs.  Abdomen: soft, NBS, NT, ND, no gaurding, rigidity  or rebound. Extremities: No cyanosis, clubbing, B/L Lower Ext shows no edema,  Neurology: Awake alert, and oriented X 3, CN II-XII intact, Non focal Skin:No Rash or lesions Wounds: N/A    Data Review   Basic Metabolic Panel: No results found for this basename: NA, K, CL, CO2, GLUCOSE, BUN, CREATININE, CALCIUM, MG, PHOS,  in the last 168 hours Liver Function Tests: No results found for this basename: AST, ALT, ALKPHOS, BILITOT, PROT, ALBUMIN,  in the last 168 hours  CBC: No results found for this basename: WBC, NEUTROABS, HGB, HCT, MCV, PLT,  in the last 168 hours ------------------------------------------------------------------------------------------------------------------ No results found for this basename: HGBA1C,  in the last 72 hours ------------------------------------------------------------------------------------------------------------------ No results found for this basename: CHOL, HDL, LDLCALC, TRIG, CHOLHDL, LDLDIRECT,  in the last 72 hours ------------------------------------------------------------------------------------------------------------------ No results found for this basename: TSH, T4TOTAL, FREET3, T3FREE, THYROIDAB,  in the last 72 hours ------------------------------------------------------------------------------------------------------------------ No results found for this basename: VITAMINB12, FOLATE, FERRITIN, TIBC, IRON, RETICCTPCT,  in the last 72 hours  Coagulation profile  No results found for this basename: INR, PROTIME,  in the last 168 hours    Assessment & Plan   Active Problems: Chronic combined systolic and diastolic CHF last echo in 10 2014 which showed EF of 30-35%, severe global hypokinesis and diastolic dysfunction, moderate to severe TR, MR - Patient is compliant with her medications, continue management per Dr. Gala Romney  Pelvic pain with uterine fibroids multiple, menorrhagia - Ambulatory referral to OB/GYN for further  evaluation. - She had a pelvic ultrasound in 2010 when which had shown multiple uterine fibroids - For now placed on tramadol as needed for pain  Preventive care - She has received pneumonia vaccination and flu vaccination in the hospital - Ambulatory referral to GYN for Pap smear - Screening mammogram ordered  Follow-up in 3 months   Emmaclaire Switala M.D. 10/03/2013, 3:03 PM

## 2013-10-04 NOTE — Progress Notes (Signed)
Patient ID: Stacey Lin, female   DOB: 1965/12/19, 47 y.o.   MRN: [id number]  PCP: [facility]  HPI: Stacey Lin is a 47 yo female with a history of anemia, combined systolic/diastolic heart failure, HTN, and history of polysubstance abuse. Previous ECHO (04/2010) EF 50-55% with grd 1 DD, LVIDd 47mm, mild to mod MR, RA and LA mildly dilated.   Admitted to the hospital 08/19/13-08/29/13 with A/C systolic HF. Discharge weight 144 lbs.  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 Mod MR and Mod-sev TR  Follow up: Last visit increased coreg to 6.25 mg BID and lisinopril 5 mg twice a day. Last week stopped by clinic very upset because she was feeling dizzy and was not sure how to take medications (she got confused of doses of coreg and lisinopril because bottles said something different than papers). We filled pill bottles for her and sorted out medications. Seen now at [facility]. Feeling ok. +SOB, but thinks maybe anxiety related, + dizziness and +CP occasionally that gets better when she massages chest. Weight at home 148-149 lbs.   ROS: All systems negative except as listed in HPI, PMH and Problem List.  Past Medical History  Diagnosis Date  . Hypertension   . Anemia   . Arrhythmia     Current Outpatient Prescriptions  Medication Sig Dispense Refill  . albuterol (VENTOLIN HFA) 108 (90 BASE) MCG/ACT inhaler Inhale 2 puffs into the lungs 4 (four) times daily as needed for wheezing.       Marland Kitchen atorvastatin (LIPITOR) 40 MG tablet Take 1 tablet (40 mg total) by mouth daily at 6 PM.  30 tablet  0  . carvedilol (COREG) 6.25 MG tablet Take 6.25 mg by mouth 2 (two) times daily with a meal.      . diphenhydramine-acetaminophen (TYLENOL PM) 25-500 MG TABS Take 2 tablets  by mouth at bedtime as needed (for pain).      . furosemide (LASIX) 40 MG tablet Take 1 tablet (40 mg total) by mouth daily.  30 tablet  0  . lisinopril (PRINIVIL,ZESTRIL) 5 MG tablet Take 1 tablet (5 mg total) by mouth 2 (two) times daily.  30 tablet  0  . spironolactone (ALDACTONE) 25 MG tablet take one half tab daily      . traMADol (ULTRAM) 50 MG tablet Take 1 tablet (50 mg total) by mouth every 6 (six) hours as needed for moderate pain.  60 tablet  2   No current facility-administered medications for this encounter.   Filed Vitals:   10/07/13 1544  BP: 106/68  Pulse: 70  Weight: 154 lb 12.8 oz (70.217 kg)  SpO2: 98%    PHYSICAL EXAM: General:  Well appearing. No resp difficulty HEENT: normal Neck: supple. JVP 8-9 Carotids 2+ bilaterally; no bruits. No lymphadenopathy or thryomegaly appreciated. Cor: PMI normal. Regular rate & rhythm. No rubs, gallops.  3/6 HSM apex. Lungs: clear Abdomen: soft, tender, nondistended. No hepatosplenomegaly. No bruits or masses. Good bowel sounds. Extremities: no cyanosis, clubbing, rash, edema Neuro: alert & orientedx3, cranial nerves grossly intact. Moves all 4 extremities w/o difficulty. Affect pleasant.  ASSESSMENT & PLAN:  1) Chronic systolic HF: NICM, EF 30-35% (29/5284); Cardiac MRI EF 32% (08/2013).  NYHA III symptoms and volume status elevated. Weight is up 6 lbs  since last visit.  - Will increase lasix to 40 mg BID x 3 days and then change daily dose to 40 mg q am and 20 mg q pm.  - Continue coreg 6.25 mg BID.  - With continued dizziness and lightheadedness will cut lisinopril back to 2.5 mg q am and 5 mg q pm. Filled patient's pill boxes. Continue spiro at current dose.  - BMET today with Guide-It - Repeat ECHO in January and if it remains less than 35% refer to EP for ICD for primary prevention.  - Reinforced the need and importance of daily weights, a low sodium diet, and fluid restriction (less than 2 L a day). Instructed to call the  HF clinic if weight increases more than 3 lbs overnight or 5 lbs in a week.  2) HTN:  Controlled on spiro, BB and ACE-I. As above will cut lisinopril back with continued dizziness and lightheadedness.    Follow up in 2 weeks Ulla Potash B NP-C 3:53 PM  Patient seen with NP, agree with the above note.  Weight is up 9 lbs and JVP is elevated.  Will increase Lasix as above.  Given lightheadedness, will decrease lisinopril slightly.  Echo in 1/5 for ?ICD and followup in 2 wks.  [name] 10/08/2013

## 2013-10-07 ENCOUNTER — Encounter (HOSPITAL_COMMUNITY): Payer: Self-pay

## 2013-10-07 ENCOUNTER — Ambulatory Visit (HOSPITAL_COMMUNITY)
Admission: RE | Admit: 2013-10-07 | Discharge: 2013-10-07 | Disposition: A | Discharge status: Home / Self Care | Payer: Medicaid Other | Source: Ambulatory Visit | Attending: Internal Medicine | Admitting: Internal Medicine

## 2013-10-07 ENCOUNTER — Encounter: Payer: Self-pay | Admitting: Internal Medicine

## 2013-10-07 VITALS — BP 106/68 | HR 70 | Wt 154.8 lb

## 2013-10-07 DIAGNOSIS — I5022 Chronic systolic (congestive) heart failure: Secondary | ICD-10-CM

## 2013-10-07 DIAGNOSIS — I509 Heart failure, unspecified: Secondary | ICD-10-CM

## 2013-10-07 DIAGNOSIS — I10 Essential (primary) hypertension: Secondary | ICD-10-CM

## 2013-10-07 MED ORDER — FUROSEMIDE 40 MG PO TABS: ORAL_TABLET | ORAL | Status: DC

## 2013-10-07 MED ORDER — CARVEDILOL 6.25 MG PO TABS: 6.2500 mg | ORAL_TABLET | Freq: Two times a day (BID) | ORAL | Status: DC

## 2013-10-07 MED ORDER — LISINOPRIL 5 MG PO TABS: ORAL_TABLET | ORAL | Status: DC

## 2013-10-07 NOTE — Patient Instructions (Signed)
Increase your lasix to 40 mg (2 tablets) daily for 3 days and then start taking 40 mg (1 tablet) every morning and 20 mg (1/2 tablet) every evening.   Cut your lisinopril to 2.5 mg (1/2 tablet) every morning and 5 mg (1 tablet) every evening.  Your pill boxes are filled currently  Follow up 2 weeks.  Do the following things EVERYDAY: 1) Weigh yourself in the morning before breakfast. Write it down and keep it in a log. 2) Take your medicines as prescribed 3) Eat low salt foods-Limit salt (sodium) to 2000 mg per day.  4) Stay as active as you can everyday 5) Limit all fluids for the day to less than 2 liters

## 2013-10-08 DIAGNOSIS — I5022 Chronic systolic (congestive) heart failure: Secondary | ICD-10-CM | POA: Insufficient documentation

## 2013-10-25 ENCOUNTER — Ambulatory Visit (HOSPITAL_COMMUNITY)
Admission: RE | Admit: 2013-10-25 | Discharge: 2013-10-25 | Disposition: A | Discharge status: Home / Self Care | Payer: Self-pay | Source: Ambulatory Visit | Attending: Internal Medicine | Admitting: Internal Medicine

## 2013-10-25 ENCOUNTER — Encounter: Payer: Self-pay | Admitting: Internal Medicine

## 2013-10-25 VITALS — BP 133/86 | HR 86 | Wt 154.5 lb

## 2013-10-25 DIAGNOSIS — I10 Essential (primary) hypertension: Secondary | ICD-10-CM

## 2013-10-25 DIAGNOSIS — K0889 Other specified disorders of teeth and supporting structures: Secondary | ICD-10-CM | POA: Insufficient documentation

## 2013-10-25 DIAGNOSIS — I5022 Chronic systolic (congestive) heart failure: Secondary | ICD-10-CM

## 2013-10-25 DIAGNOSIS — D500 Iron deficiency anemia secondary to blood loss (chronic): Secondary | ICD-10-CM

## 2013-10-25 DIAGNOSIS — K089 Disorder of teeth and supporting structures, unspecified: Secondary | ICD-10-CM

## 2013-10-25 LAB — CBC
HCT: 30.8 % — ABNORMAL LOW (ref 36.0–46.0)
Hemoglobin: 10.1 g/dL — ABNORMAL LOW (ref 12.0–15.0)
MCH: 29.5 pg (ref 26.0–34.0)
MCHC: 32.8 g/dL (ref 30.0–36.0)
MCV: 90.1 fL (ref 78.0–100.0)
PLATELETS: 249 10*3/uL (ref 150–400)
RBC: 3.42 MIL/uL — ABNORMAL LOW (ref 3.87–5.11)
RDW: 16.9 % — ABNORMAL HIGH (ref 11.5–15.5)
WBC: 7.2 10*3/uL (ref 4.0–10.5)

## 2013-10-25 MED ORDER — FUROSEMIDE 40 MG PO TABS: 40.0000 mg | ORAL_TABLET | Freq: Two times a day (BID) | ORAL | Status: DC

## 2013-10-25 NOTE — Progress Notes (Signed)
Patient ID: Stacey Lin, female   DOB: 02/02/1966, 48 y.o.   MRN: [id number]  PCP: [facility]  HPI: Stacey Lin is a 48 yo female with a history of anemia, combined systolic/diastolic heart failure, HTN, and history of polysubstance abuse. Previous ECHO (04/2010) EF 50-55% with grd 1 DD, LVIDd 47mm, mild to mod MR, RA and LA mildly dilated.   Admitted to the hospital 08/19/13-08/29/13 with A/C systolic HF. Discharge weight 144 lbs.  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 Mod MR and Mod-sev TR  Follow up: Last visit volume status elevated and lasix was increased to 40 mg BID x 3 days and then back to 40 gm q am and 20 mg q pm. Her lisinopril was also cut back to 2.5 mg q am and 5 mg q pm d/t dizziness. Presents today and reports last night had orthopnea and dizziness. She is crying this am because she has teeth pain and thinks she has an abcess. She has taken a few of her sons hydrocodone and wanted to be honest, but reports she hurts so much and is using a heating pad and nothing is helping. She does not have insurance at this time and concerned she can't get her teeth fixed. Taking medications as prescribed. Reports being a little more fatigued. Denies CP or edema. She has not been weighing because she has been at son's house, but is returning home today because she can't keep taking care of her grandchild all day and keep house, too much.   ROS: All systems negative except as listed in HPI, PMH and Problem List.  Past Medical History  Diagnosis Date  . Hypertension   . Anemia   . Arrhythmia     Current Outpatient Prescriptions  Medication Sig Dispense Refill  . albuterol (VENTOLIN HFA) 108 (90 BASE) MCG/ACT inhaler Inhale 2 puffs into the lungs 4 (four) times daily as  needed for wheezing.       Marland Kitchen atorvastatin (LIPITOR) 40 MG tablet Take 1 tablet (40 mg total) by mouth daily at 6 PM.  30 tablet  0  . carvedilol (COREG) 6.25 MG tablet Take 6.25 mg by mouth 2 (two) times daily with a meal.      . furosemide (LASIX) 40 MG tablet Take 40 mg (1 tablet) every morning and 20 mg (1/2 tablet) every evening.  45 tablet  3  . lisinopril (PRINIVIL,ZESTRIL) 5 MG tablet Take 2.5 mg (1/2 tablet) every morning and 5 mg (1 tablet) every evening.  45 tablet  3  . spironolactone (ALDACTONE) 25 MG tablet take one half tab daily      . traMADol (ULTRAM) 50 MG tablet Take 1 tablet (50 mg total) by mouth every 6 (six) hours as needed for moderate pain.  60 tablet  2  . diphenhydramine-acetaminophen (TYLENOL PM) 25-500 MG TABS Take 2 tablets by mouth at bedtime as needed (for pain).       No current facility-administered medications for this encounter.   Filed Vitals:   10/25/13 0912  BP: 133/86  Pulse: 86  Weight: 154 lb 8 oz (70.081 kg)  SpO2: 98%    PHYSICAL EXAM: General:  Well appearing. No resp difficulty HEENT: normal Neck: supple. JVP 9 Carotids 2+ bilaterally; no bruits. No lymphadenopathy or thryomegaly appreciated. Cor: PMI  normal. Regular rate & rhythm. No rubs, gallops.  3/6 HSM apex. Lungs: clear Abdomen: soft, tender, nondistended. No hepatosplenomegaly. No bruits or masses. Good bowel sounds. Extremities: no cyanosis, clubbing, rash, edema Neuro: alert & orientedx3, cranial nerves grossly intact. Moves all 4 extremities w/o difficulty. Affect pleasant.  ASSESSMENT & PLAN:  1) Chronic systolic HF: NICM, EF 30-35% (44/0102); Cardiac MRI EF 32% (08/2013).  NYHA III symptoms and volume status remains elevated. Weight is stable, but up 10 lbs since 08/2013. Will increase lasix to 40 mg BID. Check BMET today with Guide-It trial. - She continues to have some dizziness, will not titrate any medications at this time. She has been taking some hydrocodone for her  teeth pain and maybe causing her dizziness. Told her to be careful taking that medication. - Continue coreg 6.25 mg BID, spiro 12.5 mg daily and lisinopril 2.5 mg q am and 5 mg q pm. - Consider starting digoxin next visit.  - F/U in 2 weeks with ECHO to reassess EF. If remains less than 35% will refer to EP for ICD for primary prevention.  - Reinforced the need and importance of daily weights, a low sodium diet, and fluid restriction (less than 2 L a day). Instructed to call the HF clinic if weight increases more than 3 lbs overnight or 5 lbs in a week.  2) HTN:  Controlled on spiro, BB and ACE-I. Will not titrate any medications for LV dysfunction with dizziness. 3) Prolonged Period - Reports she has had her menstrual cycle for a whole month and has needed transfusions in the past. Will check CBC and told to call her PCP. 4) Teeth pain - She does not have insurance and is complaining of teeth pain. She is concerned she may need them pulled. She does not have insurance currently and is in the process of obtaining Medicaid hopefully. Told her to go to [facility] to apply for orange card and provided her two dentist names to follow up with.  Follow up in 2 weeks with ECHO.  Ulla Potash B NP-C 9:14 AM

## 2013-10-25 NOTE — Patient Instructions (Signed)
Increase your lasix to 40 mg (1 tablet) every morning and 40 mg (1 tablet) every evening.  Take all other medications the same way.  Follow up 2 weeks with ECHO.  Call any issues [phone].  Follow up with your Primary MD and call the dentist.   Do the following things EVERYDAY: 1) Weigh yourself in the morning before breakfast. Write it down and keep it in a log. 2) Take your medicines as prescribed 3) Eat low salt foods-Limit salt (sodium) to 2000 mg per day.  4) Stay as active as you can everyday 5) Limit all fluids for the day to less than 2 liters 6)

## 2013-10-29 ENCOUNTER — Encounter: Payer: Self-pay | Admitting: Internal Medicine

## 2013-10-29 ENCOUNTER — Telehealth: Payer: Self-pay

## 2013-11-06 ENCOUNTER — Ambulatory Visit: Payer: Self-pay

## 2013-11-08 ENCOUNTER — Encounter: Payer: Self-pay | Admitting: Internal Medicine

## 2013-11-08 ENCOUNTER — Ambulatory Visit (HOSPITAL_COMMUNITY)
Admission: RE | Admit: 2013-11-08 | Discharge: 2013-11-08 | Disposition: A | Discharge status: Home / Self Care | Payer: Medicaid Other | Source: Ambulatory Visit | Attending: Internal Medicine | Admitting: Internal Medicine

## 2013-11-08 ENCOUNTER — Ambulatory Visit (HOSPITAL_BASED_OUTPATIENT_CLINIC_OR_DEPARTMENT_OTHER)
Admission: RE | Admit: 2013-11-08 | Discharge: 2013-11-08 | Disposition: A | Discharge status: Home / Self Care | Payer: Medicaid Other | Source: Ambulatory Visit | Attending: Internal Medicine | Admitting: Internal Medicine

## 2013-11-08 VITALS — BP 115/75 | HR 71 | Wt 158.8 lb

## 2013-11-08 DIAGNOSIS — I5022 Chronic systolic (congestive) heart failure: Secondary | ICD-10-CM

## 2013-11-08 DIAGNOSIS — I059 Rheumatic mitral valve disease, unspecified: Secondary | ICD-10-CM | POA: Insufficient documentation

## 2013-11-08 DIAGNOSIS — I079 Rheumatic tricuspid valve disease, unspecified: Secondary | ICD-10-CM | POA: Insufficient documentation

## 2013-11-08 DIAGNOSIS — D500 Iron deficiency anemia secondary to blood loss (chronic): Secondary | ICD-10-CM

## 2013-11-08 DIAGNOSIS — J449 Chronic obstructive pulmonary disease, unspecified: Secondary | ICD-10-CM

## 2013-11-08 DIAGNOSIS — F17200 Nicotine dependence, unspecified, uncomplicated: Secondary | ICD-10-CM

## 2013-11-08 DIAGNOSIS — R42 Dizziness and giddiness: Secondary | ICD-10-CM

## 2013-11-08 DIAGNOSIS — I359 Nonrheumatic aortic valve disorder, unspecified: Secondary | ICD-10-CM | POA: Insufficient documentation

## 2013-11-08 DIAGNOSIS — I509 Heart failure, unspecified: Secondary | ICD-10-CM | POA: Insufficient documentation

## 2013-11-08 LAB — CBC
HCT: 31.5 % — ABNORMAL LOW (ref 36.0–46.0)
HEMOGLOBIN: 10.3 g/dL — AB (ref 12.0–15.0)
MCH: 30.2 pg (ref 26.0–34.0)
MCHC: 32.7 g/dL (ref 30.0–36.0)
MCV: 92.4 fL (ref 78.0–100.0)
Platelets: 249 10*3/uL (ref 150–400)
RBC: 3.41 MIL/uL — ABNORMAL LOW (ref 3.87–5.11)
RDW: 14.4 % (ref 11.5–15.5)
WBC: 8.1 10*3/uL (ref 4.0–10.5)

## 2013-11-08 MED ORDER — SPIRONOLACTONE 25 MG PO TABS: 12.5000 mg | ORAL_TABLET | Freq: Every day | ORAL | Status: DC

## 2013-11-08 MED ORDER — LISINOPRIL 5 MG PO TABS: ORAL_TABLET | ORAL | Status: DC

## 2013-11-08 MED ORDER — FUROSEMIDE 40 MG PO TABS: 60.0000 mg | ORAL_TABLET | Freq: Two times a day (BID) | ORAL | Status: DC

## 2013-11-08 MED ORDER — CARVEDILOL 6.25 MG PO TABS: 6.2500 mg | ORAL_TABLET | Freq: Two times a day (BID) | ORAL | Status: DC

## 2013-11-08 NOTE — Progress Notes (Signed)
Patient ID: Stacey Lin, female   DOB: 1966/07/29, 49 y.o.   MRN: [id number]  PCP: [facility]  HPI: Stacey Lin is a 48 yo female with a history of anemia, combined systolic/diastolic heart failure, HTN, and history of polysubstance abuse. Previous ECHO (04/2010) EF 50-55% with grd 1 DD, LVIDd 47mm, mild to mod MR, RA and LA mildly dilated.   Admitted to the hospital 08/19/13-08/29/13 with A/C systolic HF. Discharge weight 144 lbs.  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 Mod MR and Mod-sev TR Echo (1/15): EF 45%, mild to moderate AI, moderate MR, RV normal size with mildly decreased systolic function.   Follow up: Last visit volume status elevated and lasix was increased to 40 mg BID. Reports she has noticed she is lightheaded more and more fatigued. Tried to go to go to appt for mammogram, but couldn't get because of no insurance. Has appt for orange card this month along with a dentist.  Sleeps with heating pad d/t tooth pain. Denies CP or edema. Reports more short winded and can't walk as far as she was last visit. Taking medications as prescribed. Still having menstrual bleeding. Walked couple hundred yards yesterday and reports was very fatigued and some SOB.    Labs (1/15): K 4.1, creatinine 0.96, hemoglobin 10.1  ROS: All systems negative except as listed in HPI, PMH and Problem List.  Past Medical History  Diagnosis Date  . Hypertension   . Anemia   . Arrhythmia     Current Outpatient Prescriptions  Medication Sig Dispense Refill  . atorvastatin (LIPITOR) 40 MG tablet Take 1 tablet (40 mg total) by mouth daily at 6 PM.  30 tablet  0  . carvedilol (COREG) 6.25 MG tablet Take 6.25 mg by mouth 2 (two) times daily with a meal.      . furosemide (LASIX) 40 MG  tablet Take 1 tablet (40 mg total) by mouth 2 (two) times daily.  60 tablet  3  . lisinopril (PRINIVIL,ZESTRIL) 5 MG tablet Take 2.5 mg (1/2 tablet) every morning and 5 mg (1 tablet) every evening.  45 tablet  3  . spironolactone (ALDACTONE) 25 MG tablet take one half tab daily      . albuterol (VENTOLIN HFA) 108 (90 BASE) MCG/ACT inhaler Inhale 2 puffs into the lungs 4 (four) times daily as needed for wheezing.       . diphenhydramine-acetaminophen (TYLENOL PM) 25-500 MG TABS Take 2 tablets by mouth at bedtime as needed (for pain).      . traMADol (ULTRAM) 50 MG tablet Take 1 tablet (50 mg total) by mouth every 6 (six) hours as needed for moderate pain.  60 tablet  2   No current facility-administered medications for this encounter.   Filed Vitals:   11/08/13 0901  BP: 122/70  Pulse: 75  Weight: 158 lb 12 oz (72.009 kg)  SpO2: 97%    PHYSICAL EXAM: General:  Well appearing. No resp difficulty HEENT: normal Neck: supple. JVP 8 Carotids 2+ bilaterally; no bruits. No lymphadenopathy or thryomegaly appreciated. Cor: PMI normal. Regular rate & rhythm. No rubs, gallops.  2/6 HSM apex. Lungs: distant lung sounds Abdomen: soft, tender, nondistended. No hepatosplenomegaly. No bruits or masses. Good bowel sounds. Extremities: no cyanosis, clubbing, rash, edema Neuro: alert & orientedx3, cranial nerves  grossly intact. Moves all 4 extremities w/o difficulty. Affect pleasant.  ASSESSMENT & PLAN:  1) Chronic systolic HF: NICM, EF 30-35% (46/9629); Cardiac MRI EF 32% (08/2013).  Echo reviewed today: EF improved to 45% with moderate MR and mild to moderate AI.  She remains dyspneic with NYHA III symptoms.  Weight is up 4 lbs.  She does not appear markedly volume elevated but given her symptoms and weight gain I will plan to increase her Lasix today - Increase Lasix to 60 mg po bid with BMET in 2 wks.  - Continue coreg 6.25 mg BID, spiro 12.5 mg daily and lisinopril 2.5 mg q am and 5 mg q pm.  I will  not titrate at this time given "dizziness."   - EF is out of ICD range at this time. - Reinforced the need and importance of daily weights, a low sodium diet, and fluid restriction (less than 2 L a day). Instructed to call the HF clinic if weight increases more than 3 lbs overnight or 5 lbs in a week.  2) "Dizziness": We checked orthostatics in the office today.  She was not orthostatic.  She continues to have a lot of vaginal bleeding.  I will get a CBC today to see if anemia is contributing.  Her PCP is working on getting her an Human resources officer.  3) Dyspnea: As above, will try increasing Lasix.  She does not look markedly overloaded but weight is up.  She also has a smoking history.  She still smokes a few cigarettes/week.  I asked her to quit completely and will get PFTs to see if COPD could be playing a role in her dyspnea.  4) Valvular heart disease: Moderate MR, mild to moderate AI on today's echo.  The LV is not significantly dilated.  Will need to follow over time.   [name] 11/08/2013 9:42 AM

## 2013-11-08 NOTE — Patient Instructions (Signed)
Increase your lasix to 60 mg (1 1/2 tablets) in the morning and 60 mg (1 1/2 tablets) in the evening.  Follow up 2 weeks with Research.  Follow up 1 month with [place].  Do the following things EVERYDAY: 1) Weigh yourself in the morning before breakfast. Write it down and keep it in a log. 2) Take your medicines as prescribed 3) Eat low salt foods-Limit salt (sodium) to 2000 mg per day.  4) Stay as active as you can everyday 5) Limit all fluids for the day to less than 2 liters

## 2013-11-08 NOTE — Progress Notes (Signed)
Echocardiogram 2D Echocardiogram has been performed.  Joelene Millin 11/08/2013, 8:49 AM

## 2013-11-13 ENCOUNTER — Encounter: Payer: Self-pay | Admitting: Obstetrics & Gynecology

## 2013-11-15 ENCOUNTER — Ambulatory Visit: Payer: Medicaid Other | Attending: Internal Medicine

## 2013-11-15 ENCOUNTER — Other Ambulatory Visit: Payer: Self-pay | Admitting: Emergency Medicine

## 2013-11-15 ENCOUNTER — Telehealth: Payer: Self-pay | Admitting: Emergency Medicine

## 2013-11-15 DIAGNOSIS — Z0000 Encounter for general adult medical examination without abnormal findings: Secondary | ICD-10-CM

## 2013-11-15 NOTE — Telephone Encounter (Signed)
Spoke with pt regarding new order for mammogram. Referral sent to [facility] instead of [facility]

## 2013-11-19 ENCOUNTER — Ambulatory Visit (HOSPITAL_COMMUNITY)
Admission: RE | Admit: 2013-11-19 | Discharge: 2013-11-19 | Disposition: A | Discharge status: Home / Self Care | Payer: Medicaid Other | Source: Ambulatory Visit | Attending: Cardiology | Admitting: Cardiology

## 2013-11-19 ENCOUNTER — Telehealth (HOSPITAL_COMMUNITY): Payer: Self-pay | Admitting: Anesthesiology

## 2013-11-19 ENCOUNTER — Encounter: Payer: Self-pay | Admitting: Internal Medicine

## 2013-11-19 DIAGNOSIS — R0989 Other specified symptoms and signs involving the circulatory and respiratory systems: Secondary | ICD-10-CM | POA: Insufficient documentation

## 2013-11-19 DIAGNOSIS — R0609 Other forms of dyspnea: Secondary | ICD-10-CM | POA: Insufficient documentation

## 2013-11-19 DIAGNOSIS — I509 Heart failure, unspecified: Secondary | ICD-10-CM | POA: Insufficient documentation

## 2013-11-19 DIAGNOSIS — F17200 Nicotine dependence, unspecified, uncomplicated: Secondary | ICD-10-CM | POA: Insufficient documentation

## 2013-11-19 DIAGNOSIS — I5022 Chronic systolic (congestive) heart failure: Secondary | ICD-10-CM

## 2013-11-19 DIAGNOSIS — R05 Cough: Secondary | ICD-10-CM | POA: Insufficient documentation

## 2013-11-19 DIAGNOSIS — R062 Wheezing: Secondary | ICD-10-CM | POA: Insufficient documentation

## 2013-11-19 DIAGNOSIS — R059 Cough, unspecified: Secondary | ICD-10-CM | POA: Insufficient documentation

## 2013-11-19 LAB — PULMONARY FUNCTION TEST
FEF 25-75 PRE: 0.65 L/s
FEF 25-75 Post: 0.3 L/sec
FEF2575-%Change-Post: -54 %
FEF2575-%PRED-POST: 13 %
FEF2575-%Pred-Pre: 28 %
FEV1-%CHANGE-POST: -31 %
FEV1-%PRED-POST: 28 %
FEV1-%Pred-Pre: 41 %
FEV1-POST: 0.59 L
FEV1-Pre: 0.86 L
FEV1FVC-%CHANGE-POST: -18 %
FEV1FVC-%Pred-Pre: 81 %
FEV6-%Change-Post: -16 %
FEV6-%PRED-PRE: 50 %
FEV6-%Pred-Post: 42 %
FEV6-POST: 1.05 L
FEV6-PRE: 1.26 L
FEV6FVC-%Pred-Post: 103 %
FEV6FVC-%Pred-Pre: 103 %
FVC-%CHANGE-POST: -15 %
FVC-%PRED-PRE: 49 %
FVC-%Pred-Post: 42 %
FVC-PRE: 1.29 L
FVC-Post: 1.09 L
POST FEV6/FVC RATIO: 100 %
PRE FEV1/FVC RATIO: 67 %
PRE FEV6/FVC RATIO: 100 %
Post FEV1/FVC ratio: 54 %
RV % PRED: 77 %
RV: 1.27 L
TLC % PRED: 58 %
TLC: 2.69 L

## 2013-11-19 MED ORDER — SPIRONOLACTONE 25 MG PO TABS: 12.5000 mg | ORAL_TABLET | Freq: Every day | ORAL | Status: DC

## 2013-11-19 MED ORDER — ALBUTEROL SULFATE (2.5 MG/3ML) 0.083% IN NEBU
2.5000 mg | INHALATION_SOLUTION | Freq: Once | RESPIRATORY_TRACT | Status: AC
  Administered 2013-11-19: 2.5 mg via RESPIRATORY_TRACT

## 2013-11-19 MED ORDER — FUROSEMIDE 40 MG PO TABS: 60.0000 mg | ORAL_TABLET | Freq: Two times a day (BID) | ORAL | Status: DC

## 2013-11-19 NOTE — Telephone Encounter (Signed)
Patient stopped by clinic needing refills of medications. Will refill Arlyce Harman and lasix to [facility] pharmacy. She will pick up later today. F/U next week.  Junie Bame B NP-C 1:01 PM

## 2013-11-26 ENCOUNTER — Ambulatory Visit: Payer: Self-pay

## 2013-11-28 ENCOUNTER — Encounter (HOSPITAL_COMMUNITY): Payer: Self-pay

## 2013-12-03 ENCOUNTER — Encounter (HOSPITAL_COMMUNITY): Payer: Self-pay

## 2013-12-03 ENCOUNTER — Telehealth (HOSPITAL_COMMUNITY): Payer: Self-pay

## 2013-12-03 ENCOUNTER — Ambulatory Visit (HOSPITAL_COMMUNITY)
Admission: RE | Admit: 2013-12-03 | Discharge: 2013-12-03 | Disposition: A | Discharge status: Home / Self Care | Payer: Self-pay | Source: Ambulatory Visit | Attending: Internal Medicine | Admitting: Internal Medicine

## 2013-12-03 VITALS — BP 127/91 | HR 85 | Ht 60.0 in | Wt 169.1 lb

## 2013-12-03 DIAGNOSIS — I5022 Chronic systolic (congestive) heart failure: Secondary | ICD-10-CM | POA: Insufficient documentation

## 2013-12-03 DIAGNOSIS — I059 Rheumatic mitral valve disease, unspecified: Secondary | ICD-10-CM | POA: Insufficient documentation

## 2013-12-03 DIAGNOSIS — F17200 Nicotine dependence, unspecified, uncomplicated: Secondary | ICD-10-CM | POA: Insufficient documentation

## 2013-12-03 DIAGNOSIS — R0609 Other forms of dyspnea: Secondary | ICD-10-CM | POA: Insufficient documentation

## 2013-12-03 DIAGNOSIS — R0989 Other specified symptoms and signs involving the circulatory and respiratory systems: Secondary | ICD-10-CM | POA: Insufficient documentation

## 2013-12-03 DIAGNOSIS — R0600 Dyspnea, unspecified: Secondary | ICD-10-CM | POA: Insufficient documentation

## 2013-12-03 DIAGNOSIS — I10 Essential (primary) hypertension: Secondary | ICD-10-CM | POA: Insufficient documentation

## 2013-12-03 DIAGNOSIS — I509 Heart failure, unspecified: Secondary | ICD-10-CM | POA: Insufficient documentation

## 2013-12-03 DIAGNOSIS — J984 Other disorders of lung: Secondary | ICD-10-CM | POA: Insufficient documentation

## 2013-12-03 MED ORDER — ATORVASTATIN CALCIUM 40 MG PO TABS: 40.0000 mg | ORAL_TABLET | Freq: Every day | ORAL | Status: DC

## 2013-12-03 MED ORDER — FUROSEMIDE 40 MG PO TABS: 80.0000 mg | ORAL_TABLET | Freq: Two times a day (BID) | ORAL | Status: DC

## 2013-12-03 MED ORDER — SPIRONOLACTONE 25 MG PO TABS: 25.0000 mg | ORAL_TABLET | Freq: Every evening | ORAL | Status: DC

## 2013-12-03 NOTE — Progress Notes (Addendum)
Patient ID: Stacey Lin, female   DOB: 02/10/1966, 48 y.o.   MRN: [id number]  PCP: [facility]  HPI: Stacey Lin is a 48 yo female with a history of anemia, combined systolic/diastolic heart failure, HTN, and history of polysubstance abuse. Previous ECHO (04/2010) EF 50-55% with grd 1 DD, LVIDd 47mm, mild to mod MR, RA and LA mildly dilated.   Admitted to the hospital 08/19/13-08/29/13 with A/C systolic HF. Discharge weight 144 lbs.  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 Mod MR and Mod-sev TR Echo (1/15): EF 45%, mild to moderate AI, moderate MR, RV normal size with mildly decreased systolic function.   Follow up: Last visit volume status elevated and lasix was increased to 60 mg BID. PFTs since last visit and very severe airflow limitation, with restrictive lung disease. Reports that lightheadedness and dizziness much improved. Still having SOB with minimal exertion and orthopnea. Denies CP or edema. Taking medications as prescribed. Weights at home 160-165 lbs. Trying to follow low salt diet and drinking less than 2L day. Son reports snoring a lot and very fatigued. Has about 1-2 cigarettes a week.   Labs (1/15): K 4.1, creatinine 0.96, hemoglobin 10.1  ROS: All systems negative except as listed in HPI, PMH and Problem List.  Past Medical History  Diagnosis Date  . Hypertension   . Anemia   . Arrhythmia     Current Outpatient Prescriptions  Medication Sig Dispense Refill  . albuterol (VENTOLIN HFA) 108 (90 BASE) MCG/ACT inhaler Inhale 2 puffs into the lungs 4 (four) times daily as needed for wheezing.       Marland Kitchen atorvastatin (LIPITOR) 40 MG tablet Take 1 tablet (40 mg total) by mouth daily at 6 PM.  30 tablet  0  . carvedilol (COREG) 6.25 MG tablet Take 1 tablet (6.25  mg total) by mouth 2 (two) times daily with a meal.  60 tablet  2  . diphenhydramine-acetaminophen (TYLENOL PM) 25-500 MG TABS Take 2 tablets by mouth at bedtime as needed (for pain).      . furosemide (LASIX) 40 MG tablet Take 1.5 tablets (60 mg total) by mouth 2 (two) times daily.  90 tablet  3  . lisinopril (PRINIVIL,ZESTRIL) 5 MG tablet Take 2.5 mg (1/2 tablet) every morning and 5 mg (1 tablet) every evening.  45 tablet  2  . spironolactone (ALDACTONE) 25 MG tablet Take 0.5 tablets (12.5 mg total) by mouth daily.  15 tablet  2  . traMADol (ULTRAM) 50 MG tablet Take 1 tablet (50 mg total) by mouth every 6 (six) hours as needed for moderate pain.  60 tablet  2   No current facility-administered medications for this encounter.   Filed Vitals:   12/03/13 1034  BP: 127/91  Pulse: 85  Height: 5' (1.524 m)  Weight: 169 lb 1.9 oz (76.712 kg)    PHYSICAL EXAM: General:  Well appearing. No resp difficulty HEENT: normal Neck: supple. JVP 8-9 Carotids 2+ bilaterally; no bruits. No lymphadenopathy or thryomegaly appreciated. Cor: PMI normal. Regular rate & rhythm. No rubs, gallops.  2/6 HSM apex. Lungs: distant lung sounds Abdomen: soft, tender, distended. No hepatosplenomegaly. No bruits or masses. Good bowel sounds. Extremities: no cyanosis, clubbing, rash, edema Neuro: alert & orientedx3, cranial nerves grossly intact. Moves all 4 extremities w/o difficulty.  Affect pleasant.  ASSESSMENT & PLAN:  1) Chronic systolic HF: NICM, EF 30-35% (19/1478); Cardiac MRI EF 32% (08/2013).  Echo reviewed today: EF improved to 45% with moderate MR and mild to moderate AI.  Continues with NYHA III symptoms and volume status elevated. At multiple visits have filled the patient's pill boxes and still appears she is not taking correctly. Not really sure how she is taking her lasix. Filled her pill boxes for the week and increased lasix to 80 mg BID (maybe too much if she starts taking correctly). Paramedicine  with patient and will start helping fill boxes on Mondays. - Check BMET today with Guide-It and in 2 weeks with increase in diuretics. - Increase spironolactone to 25 mg daily. - Continue coreg 6.25 mg BID and lisinopril 2.5 mg q am and 5 mg q pm. - EF is out of ICD range at this time. - Reinforced the need and importance of daily weights, a low sodium diet, and fluid restriction (less than 2 L a day). Instructed to call the HF clinic if weight increases more than 3 lbs overnight or 5 lbs in a week.  2) Dyspnea: As above, will try increasing Lasix again. Hoping now that paramedicine will help fill boxes that she will be taking correctly. As above check BMET today and in 2 weeks. PFTs reviewed and very severe airflow limitation, with restrictive lung disease. Has a hx of smoking and still has a few occasionally. Will refer to Pulmonology for review of PFTs and sleep consult. 3) Valvular heart disease: Moderate MR, mild to moderate AI on today's echo.  The LV is not significantly dilated.  Will need to follow over time.  4) HTN- Controlled on current medications. As above will increase Cleda Daub for LV dysfunction.  5) Nicotine abuse- encouraged her to continue to abstain completely.   F/U 2 weeks Stacey Rud NP-C 12/03/2013 10:38 AM

## 2013-12-03 NOTE — Patient Instructions (Signed)
Increase your lasix to 2 tablets (80 mg) in the morning and 2 tablets (80 mg) at night.  Increase your spironolactone to 25 mg (1 tablet, currently 2 half tablets) daily.   Go get your atorvastatin.  F/U 2 weeks.  Do the following things EVERYDAY: 1) Weigh yourself in the morning before breakfast. Write it down and keep it in a log. 2) Take your medicines as prescribed 3) Eat low salt foods-Limit salt (sodium) to 2000 mg per day.  4) Stay as active as you can everyday 5) Limit all fluids for the day to less than 2 liters 6)

## 2013-12-03 NOTE — Addendum Note (Signed)
Encounter addended by: Rande Brunt, NP on: 12/03/2013 12:42 PM<BR>     Documentation filed: Follow-up Section

## 2013-12-03 NOTE — Telephone Encounter (Signed)
Patient called and made aware of referral with [facility] at [facility] 2/27 at 11:00.  Informed to expect letter in the mail as reminder as well. Renee Pain

## 2013-12-11 ENCOUNTER — Ambulatory Visit (HOSPITAL_COMMUNITY): Payer: MEDICAID

## 2013-12-11 ENCOUNTER — Encounter (HOSPITAL_COMMUNITY): Payer: Self-pay

## 2013-12-18 ENCOUNTER — Other Ambulatory Visit: Payer: Self-pay | Admitting: Internal Medicine

## 2013-12-18 ENCOUNTER — Other Ambulatory Visit (HOSPITAL_COMMUNITY): Payer: Self-pay

## 2013-12-18 ENCOUNTER — Encounter (HOSPITAL_COMMUNITY): Payer: Self-pay

## 2013-12-18 ENCOUNTER — Ambulatory Visit (HOSPITAL_COMMUNITY)
Admission: RE | Admit: 2013-12-18 | Discharge: 2013-12-18 | Disposition: A | Discharge status: Home / Self Care | Payer: Medicaid Other | Source: Ambulatory Visit | Attending: Internal Medicine | Admitting: Internal Medicine

## 2013-12-18 ENCOUNTER — Ambulatory Visit (HOSPITAL_BASED_OUTPATIENT_CLINIC_OR_DEPARTMENT_OTHER)
Admission: RE | Admit: 2013-12-18 | Discharge: 2013-12-18 | Disposition: A | Discharge status: Home / Self Care | Payer: Self-pay | Source: Ambulatory Visit | Attending: Internal Medicine | Admitting: Internal Medicine

## 2013-12-18 VITALS — BP 116/74 | HR 78 | Resp 16 | Wt 168.2 lb

## 2013-12-18 DIAGNOSIS — Z0000 Encounter for general adult medical examination without abnormal findings: Secondary | ICD-10-CM

## 2013-12-18 DIAGNOSIS — I509 Heart failure, unspecified: Secondary | ICD-10-CM

## 2013-12-18 DIAGNOSIS — F17200 Nicotine dependence, unspecified, uncomplicated: Secondary | ICD-10-CM

## 2013-12-18 DIAGNOSIS — I10 Essential (primary) hypertension: Secondary | ICD-10-CM

## 2013-12-18 DIAGNOSIS — Z1231 Encounter for screening mammogram for malignant neoplasm of breast: Secondary | ICD-10-CM

## 2013-12-18 DIAGNOSIS — R0600 Dyspnea, unspecified: Secondary | ICD-10-CM

## 2013-12-18 DIAGNOSIS — R0989 Other specified symptoms and signs involving the circulatory and respiratory systems: Secondary | ICD-10-CM

## 2013-12-18 DIAGNOSIS — R0609 Other forms of dyspnea: Secondary | ICD-10-CM

## 2013-12-18 DIAGNOSIS — I5022 Chronic systolic (congestive) heart failure: Secondary | ICD-10-CM

## 2013-12-18 MED ORDER — CARVEDILOL 6.25 MG PO TABS: 6.2500 mg | ORAL_TABLET | Freq: Two times a day (BID) | ORAL | Status: DC

## 2013-12-18 MED ORDER — LISINOPRIL 5 MG PO TABS: ORAL_TABLET | ORAL | Status: DC

## 2013-12-18 NOTE — Progress Notes (Signed)
Patient ID: Stacey Lin, female   DOB: 1966-02-27, 48 y.o.   MRN: [id number]  PCP: [facility]  HPI: Ms. Poirrier is a 48 yo female with a history of anemia, combined systolic/diastolic heart failure, HTN, and history of polysubstance abuse. Previous ECHO (04/2010) EF 50-55% with grd 1 DD, LVIDd 47mm, mild to mod MR, RA and LA mildly dilated.   Admitted to the hospital 08/19/13-08/29/13 with A/C systolic HF. Discharge weight 144 lbs.  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 Mod MR and Mod-sev TR Echo (1/15): EF 45%, mild to moderate AI, moderate MR, RV normal size with mildly decreased systolic function.   Follow up: Last visit volume status elevated and increased lasix was to 80 mg BID. Referral to pulmonology. PFTs since last visit and very severe airflow limitation, with restrictive lung disease. Reports that lightheadedness and dizziness much improved. Still having SOB with minimal exertion and orthopnea. Denies CP or edema. Taking medications as prescribed. Weights at home 160-165 lbs. Trying to follow low salt diet and drinking less than 2L day. Son reports snoring a lot and very fatigued. Has about 1-2 cigarettes a week.   Labs (1/15): K 4.1, creatinine 0.96, hemoglobin 10.1  ROS: All systems negative except as listed in HPI, PMH and Problem List.  Past Medical History  Diagnosis Date  . Hypertension   . Anemia   . Arrhythmia     Current Outpatient Prescriptions  Medication Sig Dispense Refill  . albuterol (VENTOLIN HFA) 108 (90 BASE) MCG/ACT inhaler Inhale 2 puffs into the lungs 4 (four) times daily as needed for wheezing.       Marland Kitchen atorvastatin (LIPITOR) 40 MG tablet Take 1 tablet (40 mg total) by mouth daily at 6 PM.  30 tablet  3  . carvedilol (COREG) 6.25 MG  tablet Take 1 tablet (6.25 mg total) by mouth 2 (two) times daily with a meal.  60 tablet  2  . diphenhydramine-acetaminophen (TYLENOL PM) 25-500 MG TABS Take 2 tablets by mouth at bedtime as needed (for pain).      . furosemide (LASIX) 40 MG tablet Take 2 tablets (80 mg total) by mouth 2 (two) times daily.  120 tablet  3  . lisinopril (PRINIVIL,ZESTRIL) 5 MG tablet Take 2.5 mg (1/2 tablet) every morning and 5 mg (1 tablet) every evening.  45 tablet  2  . spironolactone (ALDACTONE) 25 MG tablet Take 1 tablet (25 mg total) by mouth every evening.  30 tablet  3  . traMADol (ULTRAM) 50 MG tablet Take 1 tablet (50 mg total) by mouth every 6 (six) hours as needed for moderate pain.  60 tablet  2   No current facility-administered medications for this encounter.   There were no vitals filed for this visit.  PHYSICAL EXAM: General:  Well appearing. No resp difficulty HEENT: normal Neck: supple. JVP 8-9 Carotids 2+ bilaterally; no bruits. No lymphadenopathy or thryomegaly appreciated. Cor: PMI normal. Regular rate & rhythm. No rubs, gallops.  2/6 HSM apex. Lungs: distant lung sounds Abdomen: soft, tender, distended. No hepatosplenomegaly. No bruits or masses. Good bowel sounds. Extremities: no cyanosis, clubbing, rash, edema Neuro: alert & orientedx3, cranial nerves grossly intact. Moves all 4 extremities w/o difficulty. Affect pleasant.  ASSESSMENT & PLAN:  1) Chronic systolic HF: NICM, EF 30-35% (40/9811);  Cardiac MRI EF 32% (08/2013). EF 45% with moderate MR and mild to moderate AI (10/2013) - Continues with NYHA II/III symptoms. Volume status stable.  - Pt still having issues with medications and filling pill box. Susie from paramedicine has been helping her, however pt was not at her house this week when they were going to refill pill boxes. Filled her box again today through Sunday and Monday Susie will refill and continue to work with her and help her learn to fill box.  - Will continue lasix  80 mg BID and check BMET.  - Check BMET today with Guide-It and in 2 weeks with increase in diuretics. - Will not titrate any medications at this time since she is still having mild dizziness and still not really sure she has been taking medications correctly. - Reinforced the need and importance of daily weights, a low sodium diet, and fluid restriction (less than 2 L a day). Instructed to call the HF clinic if weight increases more than 3 lbs overnight or 5 lbs in a week.  2) Dyspnea: Has improved some, however still has issues and appears euvolemic. PFTs showed very severe airflow limitation, with restrictive lung disease. Has a hx of smoking and still has a few occasionally. Has appt with Pulmonology on Friday for COPD and possible OSA. 3) Valvular heart disease: Moderate MR, mild to moderate AI on ECHO (10/2013)  The LV is not significantly dilated.  Will need to follow over time.  4) HTN- Controlled on current medications. Marland Kitchen  5) Nicotine abuse- encouraged her to continue to abstain completely.   F/U 3 weeks Aundria Rud NP-C 12/18/2013 2:51 PM

## 2013-12-18 NOTE — Patient Instructions (Signed)
Go get your medications refilled.  When you get home fill your pill box.   Wednesday night- 1 (tablet) Lisinopril 5 mg Thursday morning- put 1 coreg tablet in box, 6.25 mg  Thursday night- put 1 coreg tablet in box, 6.25 mg and 1 tablet of lisinopril 5 mg Friday morning- put 1 coreg tablet in box, 6.25 mg  Friday night- put 1 coreg tablet in box, 6.25 mg and 1 tablet of lisinopril 5 mg Saturday morning- put 1 coreg tablet in box, 6.25 mg  Saturday night-  put 1 coreg tablet in box, 6.25 mg and 1 tablet of lisinopril 5 mg Sunday morning- put 1 coreg tablet in box, 6.25 mg and 1/2 tablet of lisinopril Sunday night- put 1 coreg tablet in box, 6.25 mg and 1 tablet of lisinopril 5 mg  F/U 3 weeks  Do the following things EVERYDAY: 1) Weigh yourself in the morning before breakfast. Write it down and keep it in a log. 2) Take your medicines as prescribed 3) Eat low salt foods-Limit salt (sodium) to 2000 mg per day.  4) Stay as active as you can everyday 5) Limit all fluids for the day to less than 2 liters 6)

## 2013-12-19 ENCOUNTER — Encounter: Payer: Self-pay | Admitting: Obstetrics & Gynecology

## 2013-12-20 ENCOUNTER — Institutional Professional Consult (permissible substitution): Payer: Self-pay | Admitting: Pulmonary Disease

## 2014-01-02 ENCOUNTER — Ambulatory Visit: Payer: Medicaid Other | Attending: Internal Medicine | Admitting: Internal Medicine

## 2014-01-02 ENCOUNTER — Encounter: Payer: Self-pay | Admitting: Internal Medicine

## 2014-01-02 VITALS — BP 108/71 | HR 85 | Temp 98.7°F | Resp 16 | Ht 60.0 in | Wt 171.0 lb

## 2014-01-02 DIAGNOSIS — F17200 Nicotine dependence, unspecified, uncomplicated: Secondary | ICD-10-CM | POA: Diagnosis not present

## 2014-01-02 DIAGNOSIS — I509 Heart failure, unspecified: Secondary | ICD-10-CM | POA: Diagnosis not present

## 2014-01-02 DIAGNOSIS — E785 Hyperlipidemia, unspecified: Secondary | ICD-10-CM | POA: Insufficient documentation

## 2014-01-02 DIAGNOSIS — I10 Essential (primary) hypertension: Secondary | ICD-10-CM | POA: Insufficient documentation

## 2014-01-02 MED ORDER — OXYCODONE-ACETAMINOPHEN 5-325 MG PO TABS: 1.0000 | ORAL_TABLET | Freq: Three times a day (TID) | ORAL | Status: DC | PRN

## 2014-01-02 MED ORDER — SPIRONOLACTONE 25 MG PO TABS: 25.0000 mg | ORAL_TABLET | Freq: Every evening | ORAL | Status: DC

## 2014-01-02 MED ORDER — ALBUTEROL SULFATE HFA 108 (90 BASE) MCG/ACT IN AERS: 2.0000 | INHALATION_SPRAY | Freq: Four times a day (QID) | RESPIRATORY_TRACT | Status: DC | PRN

## 2014-01-02 MED ORDER — FUROSEMIDE 40 MG PO TABS: 80.0000 mg | ORAL_TABLET | Freq: Two times a day (BID) | ORAL | Status: DC

## 2014-01-02 MED ORDER — CARVEDILOL 6.25 MG PO TABS: 6.2500 mg | ORAL_TABLET | Freq: Two times a day (BID) | ORAL | Status: DC

## 2014-01-02 MED ORDER — ATORVASTATIN CALCIUM 40 MG PO TABS: 40.0000 mg | ORAL_TABLET | Freq: Every day | ORAL | Status: DC

## 2014-01-02 MED ORDER — LISINOPRIL 5 MG PO TABS: ORAL_TABLET | ORAL | Status: DC

## 2014-01-02 NOTE — Progress Notes (Signed)
Patient ID: Stacey Lin, female   DOB: November 13, 1965, 48 y.o.   MRN: [id number]   CC: Refill medicines  HPI: Patient is 48 year old female with hypertension and hyperlipidemia, presenting to clinic requesting refill on medicines. She denies shortness of breath, no recent sicknesses or hospitalizations, no abdominal or urinary concerns, no concerns today.  No Known Allergies Past Medical History  Diagnosis Date  . Hypertension   . Anemia   . Arrhythmia    Current Outpatient Prescriptions on File Prior to Visit  Medication Sig Dispense Refill  . traMADol (ULTRAM) 50 MG tablet Take 1 tablet (50 mg total) by mouth every 6 (six) hours as needed for moderate pain.  60 tablet  2  . diphenhydramine-acetaminophen (TYLENOL PM) 25-500 MG TABS Take 2 tablets by mouth at bedtime as needed (for pain).       No current facility-administered medications on file prior to visit.   Family History  Problem Relation Age of Onset  . Hypertension Mother   . Hypertension Father   . Hypertension Maternal Grandmother    History   Social History  . Marital Status: Legally Separated    Spouse Name: N/A    Number of Children: N/A  . Years of Education: N/A   Occupational History  . Not on file.   Social History Main Topics  . Smoking status: Current Some Day Smoker  . Smokeless tobacco: Not on file  . Alcohol Use: No  . Drug Use: No     Comment: former  . Sexual Activity: Not on file   Other Topics Concern  . Not on file   Social History Narrative  . No narrative on file    Review of Systems  Constitutional: Negative for fever, chills, diaphoresis, activity change, appetite change and fatigue.  HENT: Negative for ear pain, nosebleeds, congestion, facial swelling, rhinorrhea, neck pain, neck stiffness and ear discharge.   Eyes: Negative for pain, discharge, redness, itching and visual disturbance.  Respiratory: Negative for cough, choking, chest tightness, shortness of breath,  wheezing and stridor.   Cardiovascular: Negative for chest pain, palpitations and leg swelling.  Gastrointestinal: Negative for abdominal distention.  Genitourinary: Negative for dysuria, urgency, frequency, hematuria, flank pain, decreased urine volume, difficulty urinating and dyspareunia.  Musculoskeletal: Negative for back pain, joint swelling, arthralgias and gait problem.  Neurological: Negative for dizziness, tremors, seizures, syncope, facial asymmetry, speech difficulty, weakness, light-headedness, numbness and headaches.  Hematological: Negative for adenopathy. Does not bruise/bleed easily.  Psychiatric/Behavioral: Negative for hallucinations, behavioral problems, confusion, dysphoric mood, decreased concentration and agitation.    Objective:   Filed Vitals:   01/02/14 1522  BP: 108/71  Pulse: 85  Temp: 98.7 F (37.1 C)  Resp: 16    Physical Exam  Constitutional: Appears well-developed and well-nourished. No distress.  CVS: RRR, S1/S2 +, no murmurs, no gallops, no carotid bruit.  Pulmonary: Effort and breath sounds normal, no stridor, rhonchi, wheezes, rales.  Abdominal: Soft. BS +,  no distension, tenderness, rebound or guarding.   Lab Results  Component Value Date   WBC 8.1 11/08/2013   HGB 10.3* 11/08/2013   HCT 31.5* 11/08/2013   MCV 92.4 11/08/2013   PLT 249 11/08/2013   Lab Results  Component Value Date   CREATININE 1.03 08/28/2013   BUN 18 08/28/2013   NA 137 08/28/2013   K 4.6 08/28/2013   CL 103 08/28/2013   CO2 26 08/28/2013    Lab Results  Component Value Date   HGBA1C 5.7* 08/20/2013  Lipid Panel     Component Value Date/Time   CHOL 143 08/20/2013 0505   TRIG 97 08/20/2013 0505   HDL 40 08/20/2013 0505   CHOLHDL 3.6 08/20/2013 0505   VLDL 19 08/20/2013 0505   LDLCALC 84 08/20/2013 0505       Assessment and plan:   Patient Active Problem List   Diagnosis Date Noted  . Hypertensive emergency - provide refills on medications. Patient advised  to check blood pressure regularly and to call us back in the numbers are persistently higher 140/90 so we can readjust medical regimen.  08/19/2013  . Congestive heart failure - clinically compensated and euvolemic on exam. Continue Lasix as prescribed. Also recommended daily weight checks and to call us back if the weight increases by more than 3 pounds in 24 hours as Lasix dose may need to be readjusted.  08/19/2013

## 2014-01-02 NOTE — Progress Notes (Signed)
Pt here f/u htn,chf with medication management Taking meds daily. Pt is being seen by Cardiology for chronic CHF Vss. Denies cp or sob

## 2014-01-02 NOTE — Patient Instructions (Signed)

## 2014-01-03 ENCOUNTER — Encounter: Payer: Self-pay | Admitting: Internal Medicine

## 2014-01-08 ENCOUNTER — Inpatient Hospital Stay (HOSPITAL_COMMUNITY): Admission: RE | Admit: 2014-01-08 | Payer: Self-pay | Source: Ambulatory Visit

## 2014-01-10 ENCOUNTER — Other Ambulatory Visit (HOSPITAL_COMMUNITY): Payer: Self-pay

## 2014-01-10 ENCOUNTER — Encounter (HOSPITAL_COMMUNITY): Payer: Self-pay

## 2014-01-10 ENCOUNTER — Encounter: Payer: Self-pay | Admitting: Internal Medicine

## 2014-01-10 ENCOUNTER — Ambulatory Visit (HOSPITAL_COMMUNITY)
Admission: RE | Admit: 2014-01-10 | Discharge: 2014-01-10 | Disposition: A | Discharge status: Home / Self Care | Payer: Medicaid Other | Source: Ambulatory Visit | Attending: Internal Medicine | Admitting: Internal Medicine

## 2014-01-10 VITALS — BP 101/69 | HR 68 | Resp 18 | Wt 171.1 lb

## 2014-01-10 DIAGNOSIS — I5022 Chronic systolic (congestive) heart failure: Secondary | ICD-10-CM | POA: Diagnosis not present

## 2014-01-10 DIAGNOSIS — R0609 Other forms of dyspnea: Secondary | ICD-10-CM

## 2014-01-10 DIAGNOSIS — R0989 Other specified symptoms and signs involving the circulatory and respiratory systems: Secondary | ICD-10-CM

## 2014-01-10 DIAGNOSIS — F17200 Nicotine dependence, unspecified, uncomplicated: Secondary | ICD-10-CM

## 2014-01-10 DIAGNOSIS — R0600 Dyspnea, unspecified: Secondary | ICD-10-CM

## 2014-01-10 DIAGNOSIS — J449 Chronic obstructive pulmonary disease, unspecified: Secondary | ICD-10-CM

## 2014-01-10 MED ORDER — ATORVASTATIN CALCIUM 40 MG PO TABS: 40.0000 mg | ORAL_TABLET | Freq: Every day | ORAL | Status: DC

## 2014-01-10 MED ORDER — CARVEDILOL 6.25 MG PO TABS: 9.3750 mg | ORAL_TABLET | Freq: Two times a day (BID) | ORAL | Status: DC

## 2014-01-10 MED ORDER — SPIRONOLACTONE 25 MG PO TABS: 25.0000 mg | ORAL_TABLET | Freq: Every evening | ORAL | Status: DC

## 2014-01-10 NOTE — Progress Notes (Signed)
Patient ID: Stacey Lin, female   DOB: [birth year], 48 y.o.   MRN: [id number]  PCP: [facility]  HPI: Stacey Lin is a 48 yo female with a history of anemia, combined systolic/diastolic heart failure, HTN, and history of polysubstance abuse. Previous ECHO (04/2010) EF 50-55% with grd 1 DD, LVIDd 47mm, mild to mod MR, RA and LA mildly dilated.   Admitted to the hospital 08/19/13-08/29/13 with A/C systolic HF. Discharge weight 144 lbs.  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 Mod MR and Mod-sev TR Echo (1/15): EF 45%, mild to moderate AI, moderate MR, RV normal size with mildly decreased systolic function.   She returns for follow up. Complains of dyspnea with exertion. SOB with ADLs. Complains of fatigue. SOB going up steps. She has been forgetting some medications about 2 days. She has not been weighing daily because she is at her sons house. Smoking 1 cigarette every other day. Snores a lot. Unable to pay for her medications. Medications provided by HF medication fund. Following low salt diet.    Labs (1/15): K 4.1, creatinine 0.96, hemoglobin 10.1 Labs (12/04/13) K 4.3 Creatinine 0.97   ROS: All systems negative except as listed in HPI, PMH and Problem List.  Past Medical History  Diagnosis Date  . Hypertension   . Anemia   . Arrhythmia     Current Outpatient Prescriptions  Medication Sig Dispense Refill  . acetaminophen (TYLENOL) 500 MG tablet Take 500 mg by mouth every 6 (six) hours as needed.      Marland Kitchen albuterol (VENTOLIN HFA) 108 (90 BASE) MCG/ACT inhaler Inhale 2 puffs into the lungs 4 (four) times daily as needed for wheezing.  1 Inhaler  11  . atorvastatin (LIPITOR) 40 MG tablet Take 1 tablet (40 mg total) by mouth daily at 6 PM.  30 tablet  11  . carvedilol  (COREG) 6.25 MG tablet Take 1 tablet (6.25 mg total) by mouth 2 (two) times daily with a meal.  60 tablet  11  . diphenhydramine-acetaminophen (TYLENOL PM) 25-500 MG TABS Take 2 tablets by mouth at bedtime as needed (for pain).      . furosemide (LASIX) 40 MG tablet Take 2 tablets (80 mg total) by mouth 2 (two) times daily.  120 tablet  11  . lisinopril (PRINIVIL,ZESTRIL) 5 MG tablet Take 2.5 mg (1/2 tablet) every morning and 5 mg (1 tablet) every evening.  45 tablet  11  . oxyCODONE-acetaminophen (ROXICET) 5-325 MG per tablet Take 1 tablet by mouth every 8 (eight) hours as needed for severe pain.  65 tablet  0  . spironolactone (ALDACTONE) 25 MG tablet Take 1 tablet (25 mg total) by mouth every evening.  30 tablet  7   No current facility-administered medications for this encounter.   Filed Vitals:   01/10/14 0947  BP: 101/69  Pulse: 68  Resp: 18  Weight: 171 lb 2 oz (77.622 kg)  SpO2: 98%    PHYSICAL EXAM: General:  Well appearing. No resp difficulty HEENT: normal Neck: supple. JVP 5-6 Carotids 2+ bilaterally; no bruits. No lymphadenopathy or thryomegaly appreciated. Cor: PMI normal. Regular rate & rhythm. No rubs, gallops.  2/6 HSM apex. Lungs: distant lung sounds Abdomen: soft, tender, distended. No hepatosplenomegaly. No bruits or masses. Good bowel sounds. Extremities: no cyanosis, clubbing, rash,  edema Neuro: alert & orientedx3, cranial nerves grossly intact. Moves all 4 extremities w/o difficulty. Affect pleasant.  ASSESSMENT & PLAN:  1) Chronic systolic HF: NICM, EF 30-35% (16/1096); Cardiac MRI EF 32% (08/2013). EF 45% with moderate MR and mild to moderate AI (10/2013).  Continues with NYHA II/III symptoms. Volume status stable. Will continue lasix 80 mg BID. Increase carvedilol 9.375 mg twice a day. Told her if she is dizzy to resume 6.25 mg twice a day.Continue lisinopril 2.5 mg in am 5 mg in pm.   - Encouraged to walk 15 minutes a day.   - Check BMET today with [facility] -  Continue paramedicine.  - Reinforced the need and importance of daily weights, a low sodium diet, and fluid restriction (less than 2 L a day). Instructed to call the HF clinic if weight increases more than 3 lbs overnight or 5 lbs in a week.  2) Dyspnea: Refer to Pulmonology  for COPD and possible OSA.  Snores a lot. Needs sleep study .  3) HTN- Controlled on current medications. .  54 Nicotine abuse- encouraged her to continue to abstain completely.   Follow up in 1 month   [name],[name] 01/10/2014 9:59 AM

## 2014-01-10 NOTE — Patient Instructions (Signed)
Follow up in 1 month  Take carvedilol 9.375 mg twice day which is 1 1/2 tabs twice a day    Do the following things EVERYDAY: 1) Weigh yourself in the morning before breakfast. Write it down and keep it in a log. 2) Take your medicines as prescribed 3) Eat low salt foods-Limit salt (sodium) to 2000 mg per day.  4) Stay as active as you can everyday 5) Limit all fluids for the day to less than 2 liters

## 2014-01-13 ENCOUNTER — Institutional Professional Consult (permissible substitution): Payer: Self-pay | Admitting: Pulmonary Disease

## 2014-01-13 ENCOUNTER — Telehealth: Payer: Self-pay | Admitting: Licensed Clinical Social Worker

## 2014-01-13 ENCOUNTER — Telehealth: Payer: Self-pay | Admitting: *Deleted

## 2014-01-13 NOTE — Telephone Encounter (Signed)
CSW contacted patient by phone to follow up on request to assist patient with disability process. Patient states that she received a letter from [facility] stating that she had been denied for SSD/SSI. Patient reports that she applied back in November but apparently has been denied. CSW encouraged patient to return call to [facility] to request follow up with appeal process. Patient states that she goes to [place] for PCP and has a pending orange card application but is waiting on documents needed for application from her brother. Patient states she will follow up with her brother to expedite the process.   CSW explained the orange card benefits and prescription assistance through the program. Patient appears to verbalize understanding of orange card and SSD/SSI appeal process. CSW continues to be available as needed for assistance and support. Raquel Sarna, [place]

## 2014-01-13 NOTE — Telephone Encounter (Signed)
I received patients labs back from [facility] study. I called patient and asked her to hold Lasix for 2 days per Deatra Canter cosgrove NP_C and then recheck her labs on 01/22/14 at 13:00.BUN was 32 and creatinine 1.41. Pt verbalized understanding.

## 2014-01-17 ENCOUNTER — Encounter: Payer: Self-pay | Admitting: Internal Medicine

## 2014-01-22 ENCOUNTER — Encounter: Payer: Self-pay | Admitting: Internal Medicine

## 2014-01-22 ENCOUNTER — Other Ambulatory Visit (HOSPITAL_COMMUNITY): Payer: Self-pay | Admitting: Adult Health

## 2014-01-22 MED ORDER — LISINOPRIL 5 MG PO TABS: ORAL_TABLET | ORAL | Status: DC

## 2014-01-27 ENCOUNTER — Other Ambulatory Visit (HOSPITAL_COMMUNITY): Payer: Self-pay | Admitting: Cardiology

## 2014-01-27 DIAGNOSIS — I509 Heart failure, unspecified: Secondary | ICD-10-CM

## 2014-01-27 MED ORDER — FUROSEMIDE 40 MG PO TABS: 80.0000 mg | ORAL_TABLET | Freq: Two times a day (BID) | ORAL | Status: DC

## 2014-01-27 MED ORDER — LISINOPRIL 5 MG PO TABS: ORAL_TABLET | ORAL | Status: DC

## 2014-01-30 ENCOUNTER — Encounter: Payer: Self-pay | Admitting: Obstetrics & Gynecology

## 2014-02-03 ENCOUNTER — Institutional Professional Consult (permissible substitution): Payer: Self-pay | Admitting: Pulmonary Disease

## 2014-02-07 ENCOUNTER — Telehealth: Payer: Self-pay | Admitting: Internal Medicine

## 2014-02-07 ENCOUNTER — Telehealth: Payer: Self-pay

## 2014-02-07 ENCOUNTER — Encounter: Payer: Self-pay | Admitting: Internal Medicine

## 2014-02-07 MED ORDER — ACETAMINOPHEN-CODEINE #3 300-30 MG PO TABS: 1.0000 | ORAL_TABLET | Freq: Four times a day (QID) | ORAL | Status: DC | PRN

## 2014-02-07 NOTE — Telephone Encounter (Signed)
Spoke with Dr Doreene Burke about patients dental pain Dr Doreene Burke authorized tylenol #3   Quantity 30 Every six hours

## 2014-02-07 NOTE — Telephone Encounter (Signed)
Pt called regarding a refill of her pain medication, please contact pt

## 2014-02-07 NOTE — Telephone Encounter (Signed)
Patient calling wanting a refill on her oxycodone She was prescribed this medication for dental pain

## 2014-02-10 ENCOUNTER — Ambulatory Visit (HOSPITAL_COMMUNITY)
Admission: RE | Admit: 2014-02-10 | Discharge: 2014-02-10 | Disposition: A | Discharge status: Home / Self Care | Payer: Medicaid Other | Source: Ambulatory Visit | Attending: Internal Medicine | Admitting: Internal Medicine

## 2014-02-10 ENCOUNTER — Encounter (HOSPITAL_COMMUNITY): Payer: Self-pay

## 2014-02-10 VITALS — BP 114/72 | HR 71 | Resp 18 | Wt 171.1 lb

## 2014-02-10 DIAGNOSIS — D500 Iron deficiency anemia secondary to blood loss (chronic): Secondary | ICD-10-CM | POA: Diagnosis not present

## 2014-02-10 DIAGNOSIS — I10 Essential (primary) hypertension: Secondary | ICD-10-CM | POA: Diagnosis not present

## 2014-02-10 DIAGNOSIS — F17200 Nicotine dependence, unspecified, uncomplicated: Secondary | ICD-10-CM

## 2014-02-10 DIAGNOSIS — I5022 Chronic systolic (congestive) heart failure: Secondary | ICD-10-CM | POA: Insufficient documentation

## 2014-02-10 DIAGNOSIS — I509 Heart failure, unspecified: Secondary | ICD-10-CM | POA: Diagnosis present

## 2014-02-10 MED ORDER — CARVEDILOL 6.25 MG PO TABS: ORAL_TABLET | ORAL | Status: DC

## 2014-02-10 NOTE — Progress Notes (Addendum)
Patient ID: Stacey Lin, female   DOB: November 02, 1965, 48 y.o.   MRN: [id number]  PCP: [facility]  HPI: Stacey Lin is a 48 yo female with a history of anemia, combined systolic/diastolic heart failure, HTN, and history of polysubstance abuse. Previous ECHO (04/2010) EF 50-55% with grd 1 DD, LVIDd 47mm, mild to mod MR, RA and LA mildly dilated.   Admitted to the hospital 08/19/13-08/29/13 with A/C systolic HF. Discharge weight 144 lbs.  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 Mod MR and Mod-sev TR Echo (1/15): EF 45%, mild to moderate AI, moderate MR, RV normal size with mildly decreased systolic function.   Follow up: Last visit increased coreg to 9.375 mg BID which she tolerated. Doing pretty good overall. Still having SOB. +PND. Able to only walk through the house because she gets SOB. Complains of fatigue. SOB going up steps. Reports not missing any medications. Has cancelled sleep study x2 and is supposed to have repeat 02/21/14. Smokes about 2 cigarettes a day. Medicaid pending and meets with someone this week about help with applying. Medications provided by HF medication fund. Trying to follow a low salt diet and reports drinking more than 2L a day. Weight at home 170 lbs.    Labs (1/15): K 4.1, creatinine 0.96, hemoglobin 10.1 Labs (12/04/13) K 4.3 Creatinine 0.97  Labs (4/15) K+4.1, Creatinine 0.88  ROS: All systems negative except as listed in HPI, PMH and Problem List.  Past Medical History  Diagnosis Date  . Hypertension   . Anemia   . Arrhythmia     Current Outpatient Prescriptions  Medication Sig Dispense Refill  . acetaminophen (TYLENOL) 500 MG tablet Take 500 mg by mouth every 6 (six) hours as needed.      Marland Kitchen acetaminophen-codeine (TYLENOL #3) 300-30 MG per  tablet Take 1 tablet by mouth every 6 (six) hours as needed for moderate pain.  30 tablet  0  . albuterol (VENTOLIN HFA) 108 (90 BASE) MCG/ACT inhaler Inhale 2 puffs into the lungs 4 (four) times daily as needed for wheezing.  1 Inhaler  11  . atorvastatin (LIPITOR) 40 MG tablet Take 1 tablet (40 mg total) by mouth daily at 6 PM.  30 tablet  11  . carvedilol (COREG) 6.25 MG tablet Take 1.5 tablets (9.375 mg total) by mouth 2 (two) times daily with a meal.  90 tablet  11  . diphenhydramine-acetaminophen (TYLENOL PM) 25-500 MG TABS Take 2 tablets by mouth at bedtime as needed (for pain).      . furosemide (LASIX) 40 MG tablet Take 2 tablets (80 mg total) by mouth 2 (two) times daily.  120 tablet  11  . lisinopril (PRINIVIL,ZESTRIL) 5 MG tablet Take 2.5 mg (1/2 tablet) every morning and 5 mg (1 tablet) every evening.  45 tablet  11  . spironolactone (ALDACTONE) 25 MG tablet Take 1 tablet (25 mg total) by mouth every evening.  30 tablet  7   No current facility-administered medications for this encounter.   Filed Vitals:   02/10/14 1139  BP: 114/72  Pulse: 71  Resp: 18  Weight: 171 lb 2 oz (77.622 kg)  SpO2: 99%    PHYSICAL EXAM: General:  Well appearing. No resp difficulty HEENT: normal Neck: supple. JVP 5-6 Carotids 2+ bilaterally; no bruits. No lymphadenopathy  or thryomegaly appreciated. Cor: PMI normal. Regular rate & rhythm. No rubs, gallops.  2/6 HSM apex. Lungs: distant lung sounds Abdomen: soft, tender, distended. No hepatosplenomegaly. No bruits or masses. Good bowel sounds. Extremities: no cyanosis, clubbing, rash, edema Neuro: alert & orientedx3, cranial nerves grossly intact. Moves all 4 extremities w/o difficulty. Affect pleasant.  ASSESSMENT & PLAN:  1) Chronic systolic HF: NICM, Cardiac MRI EF 32% (08/2013). EF 45% with moderate MR and mild to moderate AI (10/2013) - Continues to have NYHA III symptoms. Volume status stable will continue lasix 80 mg BID. - Increase coreg  to 9.375 mg q am and 12.5 mg q pm, told to call if any dizziness. - Continue lisinopril 2.5 mg q am and 5 mg q pm, has not tolerated up-titration in the past d/t dizziness, but that that time she was not always taking medications correctly.   - Last BMET stable.  - With continued SOB will schedule CPX to assess.   - Reinforced the need and importance of daily weights, a low sodium diet, and fluid restriction (less than 2 L a day). Instructed to call the HF clinic if weight increases more than 3 lbs overnight or 5 lbs in a week.  2) Dyspnea: Has been referred to pulmonology  for COPD and possible OSA, however has had to cancel past 2 appts for sleep study. She is scheduled for 5/1 for sleep consult and stressed the importance to not miss apt.  3) HTN- Controlled on current medications. Increasing coreg for LV dysfunction.  4) Nicotine abuse- encouraged her to abstain completely. She is currently smoking about 2 cigarettes a day.  5) Anemia- Followed by PCP. Would like to have hysterectomy. Will discuss with Stacey Lin whether she is stable from HF standpoint. Last EF 45% so likely she should be fine to proceed.  Follow up 2-3 weeks with Stacey Pho NP-C 02/10/2014 11:56 AM

## 2014-02-10 NOTE — Patient Instructions (Signed)
Doing well.  Will increase your coreg to 9.375 mg (1 1/2 tablets) in the morning and 12.5 mg (2 tablets) in the evening.  Will schedule CPX test.  Don't forget to go to your sleep study on 5/1  F/U 3 weeks  Do the following things EVERYDAY: 1) Weigh yourself in the morning before breakfast. Write it down and keep it in a log. 2) Take your medicines as prescribed 3) Eat low salt foods-Limit salt (sodium) to 2000 mg per day.  4) Stay as active as you can everyday 5) Limit all fluids for the day to less than 2 liters 6)

## 2014-02-14 ENCOUNTER — Telehealth: Payer: Self-pay | Admitting: *Deleted

## 2014-02-14 ENCOUNTER — Other Ambulatory Visit: Payer: Self-pay | Admitting: *Deleted

## 2014-02-14 DIAGNOSIS — K1379 Other lesions of oral mucosa: Secondary | ICD-10-CM

## 2014-02-14 NOTE — Telephone Encounter (Signed)
Returned patients call in regards to a refill for pain medication. Patient stated she was prescribed Tylenol#3 for mouth pain and started having a rapid heart beat and migraine headaches. Patient stated she was prescribed another medication Roxicet that worked for her pain from our office. Patient reports the prescription was filled at the [facility]. Patient is requesting a refill on that medication. Informed patient that per policy we do not prescribe narcotics. Patient stated she would like to be referred to pain management. Pain management referral made per PCP. Alverda Skeans, RN

## 2014-02-18 ENCOUNTER — Telehealth: Payer: Self-pay

## 2014-02-18 NOTE — Telephone Encounter (Signed)
Last note on patient states that she had headache and sensation of rapid heart beat with tylenol #3 and did not want this medication, preferred something different.

## 2014-02-18 NOTE — Telephone Encounter (Signed)
Patient requesting refill on her tylenol #3

## 2014-02-21 ENCOUNTER — Encounter: Payer: Self-pay | Admitting: Pulmonary Disease

## 2014-02-21 ENCOUNTER — Ambulatory Visit: Payer: Medicaid Other | Admitting: Pulmonary Disease

## 2014-02-21 VITALS — BP 136/74 | HR 76 | Ht 60.0 in | Wt 176.0 lb

## 2014-02-21 DIAGNOSIS — R0683 Snoring: Secondary | ICD-10-CM

## 2014-02-21 NOTE — Assessment & Plan Note (Signed)
The patient's history is suspicious for clinically significant sleep apnea, although I can't exclude this primarily being an issue with insomnia.  I have had a long discussion with her about sleep apnea, including its impact to her quality of life and cardiovascular health.  I think she is going to need a sleep study in order to establish a diagnosis. If this is sleep apnea, we can arrange for treatment with a positive pressure device. If she does not have sleep apnea, she will probably need referral to a behavioral specialist for cognitive behavioral therapy. I've also encouraged her to work aggressively on weight loss.

## 2014-02-21 NOTE — Progress Notes (Signed)
Subjective:    Patient ID: Marvel Plan, female    DOB: 02-06-1966, 48 y.o.   MRN: [id number]  HPI The patient is a 48 year old female who I've been asked to see for possible obstructive sleep apnea. The patient has chronic congestive heart failure, and was recently in the hospital where she was found to have loud snoring as well as witnessed apneas. She tells me that she does snore at home, but no one has ever commented on an abnormal breathing pattern during sleep. The patient has very restless sleep with frequent awakenings, and does not feel rested in the mornings upon arising. She notes definite daytime sleepiness with inactivity, but she does not have sleepiness issues in the evening. She has occasional sleepiness driving longer distances. She tells me that her weight is up about 30 pounds over the last one year, and her Epworth score today is 13.   Sleep Questionnaire What time do you typically go to bed?( Between what hours) 9:00-11:00 9:00-11:00 at 1341 on 02/21/14 by Velvet Bathe, CMA How long does it take you to fall asleep? about 2 hours unless I take a sleeping pill about 2 hours unless I take a sleeping pill at 1341 on 02/21/14 by Velvet Bathe, CMA How many times during the night do you wake up? 5 5 at 1341 on 02/21/14 by Velvet Bathe, CMA What time do you get out of bed to start your day? 0600 0600 at 1341 on 02/21/14 by Velvet Bathe, CMA Do you drive or operate heavy machinery in your occupation? How much has your weight changed (up or down) over the past two years? (In pounds) 30 lb (13.608 kg)30 lb (13.608 kg) increase in weight at 1341 on 02/21/14 by Velvet Bathe, CMA Have you ever had a sleep study before? No No at 1341 on 02/21/14 by Velvet Bathe, CMA Do you currently use CPAP? No No at 1341 on 02/21/14 by Velvet Bathe, CMA Do you wear oxygen at any time? No No at 1341 on 02/21/14 by Velvet Bathe,  CMA   Review of Systems  Constitutional: Positive for appetite change, fatigue and unexpected weight change. Negative for fever.  HENT: Positive for congestion, dental problem, ear pain, sinus pressure and trouble swallowing. Negative for nosebleeds, postnasal drip, rhinorrhea, sneezing and sore throat.   Eyes: Negative for redness and itching.  Respiratory: Positive for cough and shortness of breath. Negative for chest tightness and wheezing.   Cardiovascular: Positive for leg swelling. Negative for palpitations.  Gastrointestinal: Negative for nausea and vomiting.  Genitourinary: Negative for dysuria.  Musculoskeletal: Negative for joint swelling.  Skin: Negative for rash.  Neurological: Positive for headaches.  Hematological: Does not bruise/bleed easily.  Psychiatric/Behavioral: Positive for dysphoric mood. The patient is not nervous/anxious.        Objective:   Physical Exam Constitutional:  Obese female, no acute distress  HENT:  Nares patent without discharge  Oropharynx without exudate, palate and uvula are normal  Eyes:  Perrla, eomi, no scleral icterus  Neck:  No JVD, no TMG  Cardiovascular:  Normal rate, regular rhythm, no rubs or gallops.  2/6 sem        Intact distal pulses  Pulmonary :  Normal breath sounds, no stridor or respiratory distress   No rales, rhonchi, or wheezing  Abdominal:  Soft, nondistended, bowel sounds present.  No tenderness noted.   Musculoskeletal:  No lower extremity edema noted.  Lymph Nodes:  No cervical  lymphadenopathy noted  Skin:  No cyanosis noted  Neurologic:  Appears sleepy but appropriate, moves all 4 extremities without obvious deficit.         Assessment & Plan:

## 2014-02-21 NOTE — Patient Instructions (Signed)
Will schedule for a sleep study, and arrange followup once the results are available. Make sure you avoid napping during the day leading up to your sleep study.

## 2014-02-24 ENCOUNTER — Ambulatory Visit (HOSPITAL_COMMUNITY): Payer: Medicaid Other

## 2014-02-24 ENCOUNTER — Encounter: Payer: Self-pay | Admitting: Family Medicine

## 2014-02-24 ENCOUNTER — Encounter (HOSPITAL_COMMUNITY): Payer: Self-pay

## 2014-02-24 ENCOUNTER — Ambulatory Visit (HOSPITAL_COMMUNITY)
Admission: RE | Admit: 2014-02-24 | Discharge: 2014-02-24 | Disposition: A | Discharge status: Home / Self Care | Payer: Medicaid Other | Source: Ambulatory Visit | Attending: Internal Medicine | Admitting: Internal Medicine

## 2014-02-24 VITALS — BP 100/70 | HR 84 | Resp 18 | Wt 174.1 lb

## 2014-02-24 DIAGNOSIS — F17200 Nicotine dependence, unspecified, uncomplicated: Secondary | ICD-10-CM | POA: Diagnosis not present

## 2014-02-24 DIAGNOSIS — J4489 Other specified chronic obstructive pulmonary disease: Secondary | ICD-10-CM | POA: Insufficient documentation

## 2014-02-24 DIAGNOSIS — I10 Essential (primary) hypertension: Secondary | ICD-10-CM | POA: Diagnosis not present

## 2014-02-24 DIAGNOSIS — J449 Chronic obstructive pulmonary disease, unspecified: Secondary | ICD-10-CM | POA: Diagnosis not present

## 2014-02-24 DIAGNOSIS — I509 Heart failure, unspecified: Secondary | ICD-10-CM | POA: Diagnosis present

## 2014-02-24 DIAGNOSIS — D500 Iron deficiency anemia secondary to blood loss (chronic): Secondary | ICD-10-CM | POA: Diagnosis not present

## 2014-02-24 MED ORDER — FUROSEMIDE 40 MG PO TABS: 80.0000 mg | ORAL_TABLET | Freq: Two times a day (BID) | ORAL | Status: DC

## 2014-02-24 NOTE — Progress Notes (Addendum)
Patient ID: Stacey Lin, female   DOB: December 11, 1965, 48 y.o.   MRN: [id number]  PCP: [facility] Pulmonologist: Dr. Shelle Iron  HPI: Stacey Lin is a 48 yo female with a history of anemia, combined systolic/diastolic heart failure, HTN, and history of polysubstance abuse. Previous ECHO (04/2010) EF 50-55% with grd 1 DD, LVIDd 47mm, mild to mod MR, RA and LA mildly dilated.   Admitted to the hospital 08/19/13-08/29/13 with A/C systolic HF. Discharge weight 144 lbs.  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 Mod MR and Mod-sev TR Echo (1/15): EF 45%, mild to moderate AI, moderate MR, RV normal size with mildly decreased systolic function.   Follow up: Last visit increased coreg to 9.375 mg q am and 12.5 mg q pm which she tolerated. Has sleep study on 03/2014. Doing pretty well. Still having DOE and mild orthopnea. Can walk about 1/2 block on level ground with no issues. Reports not missing any medications. Denies CP or LE edema. Smokes about 2 cigarettes a day. Medicaid pending. Medications provided by HF medication fund. Trying to follow a low salt diet and reports trying to restrict her fluids to less than 2L a day. Weight at home 170-174 lbs. Mild dizziness  Labs (1/15): K 4.1, creatinine 0.96, hemoglobin 10.1 Labs (12/04/13) K 4.3 Creatinine 0.97  Labs (4/15) K+4.1, Creatinine 0.88  ROS: All systems negative except as listed in HPI, PMH and Problem List.  Past Medical History  Diagnosis Date  . Hypertension   . Anemia   . Arrhythmia   . Hypercholesteremia   . Chronic headaches     Current Outpatient Prescriptions  Medication Sig Dispense Refill  . acetaminophen-codeine (TYLENOL #3) 300-30 MG per tablet Take 1 tablet by mouth every 6 (six) hours as needed for moderate pain.   30 tablet  0  . albuterol (VENTOLIN HFA) 108 (90 BASE) MCG/ACT inhaler Inhale 2 puffs into the lungs 4 (four) times daily as needed for wheezing.  1 Inhaler  11  . atorvastatin (LIPITOR) 40 MG tablet Take 1 tablet (40 mg total) by mouth daily at 6 PM.  30 tablet  11  . carvedilol (COREG) 6.25 MG tablet Take 9.375 mg (1 1/2 tablets) in the morning and 12.5 mg (2 tablets) in the evening  105 tablet  3  . furosemide (LASIX) 40 MG tablet Take 2 tablets (80 mg total) by mouth 2 (two) times daily.  120 tablet  11  . lisinopril (PRINIVIL,ZESTRIL) 5 MG tablet Take 2.5 mg (1/2 tablet) every morning and 5 mg (1 tablet) every evening.  45 tablet  11  . spironolactone (ALDACTONE) 25 MG tablet Take 1 tablet (25 mg total) by mouth every evening.  30 tablet  7   No current facility-administered medications for this encounter.   Filed Vitals:   02/24/14 1006  BP: 100/70  Pulse: 84  Resp: 18  Weight: 174 lb 2 oz (78.983 kg)  SpO2: 98%    PHYSICAL EXAM: General:  Well appearing. No resp difficulty HEENT: normal Neck: supple. JVP flat; Carotids 2+ bilaterally; no bruits. No lymphadenopathy or thryomegaly appreciated. Cor: PMI normal. Regular rate & rhythm. No rubs, gallops.  2/6 HSM apex. Lungs: distant lung sounds Abdomen: soft, tender, mildly distended. No hepatosplenomegaly. No bruits or masses. Good bowel sounds. Extremities: no cyanosis,  clubbing, rash, edema Neuro: alert & orientedx3, cranial nerves grossly intact. Moves all 4 extremities w/o difficulty. Affect pleasant.  ASSESSMENT & PLAN:  1) Chronic systolic HF: NICM, Cardiac MRI EF 32% (08/2013). EF 45% with moderate MR and mild to moderate AI (10/2013) - NYHA II/III symptoms and volume status stable.  - SBP soft and still having mild dizziness will not titrate any medications. Continue coreg 9.375 mg q am and 12.5 mg q pm, lisinopril 2.5 mg q am and 5 mg q pm and spiro 25 mg daily.  - check BMET today with Guide-it - CPX test today with  preliminary results showing that chronic SOB more lung related and not cardiac.  - Reinforced the need and importance of daily weights, a low sodium diet, and fluid restriction (less than 2 L a day). Instructed to call the HF clinic if weight increases more than 3 lbs overnight or 5 lbs in a week.  2) Dyspnea: Has been referred to pulmonology  for COPD and possible OSA, however has only been seen about OSA. She is scheduled for sleep study in June. Will refer back for COPD. PFTs pre CPX: FVC- 1.66 (66%), FEV1-1.19 (59%), FEV1/FVC 97% 3) HTN- Controlled on current medications.  4) Nicotine abuse- Discussed the need to quit smoking and that from CPX test today lung function limited. Currently smoking 2 cigarettes a day. 5) Anemia- Followed by PCP. Would like to have hysterectomy. Stable from HF perspective for hysterectomy, but needs to be cleared by pulmonology before she can proceed.  F/U 6 weeks Stacey Rud NP-C 02/24/2014 10:29 AM

## 2014-02-24 NOTE — Addendum Note (Signed)
Encounter addended by: Rande Brunt, NP on: 02/24/2014 10:54 AM<BR>     Documentation filed: Notes Section

## 2014-02-24 NOTE — Patient Instructions (Signed)
Continue current medications.  Call any issues.  Will refer to pulmonary for COPD.  Follow up 6 weeks.  Do the following things EVERYDAY: 1) Weigh yourself in the morning before breakfast. Write it down and keep it in a log. 2) Take your medicines as prescribed 3) Eat low salt foods-Limit salt (sodium) to 2000 mg per day.  4) Stay as active as you can everyday 5) Limit all fluids for the day to less than 2 liters 6)

## 2014-03-03 ENCOUNTER — Telehealth: Payer: Self-pay | Admitting: Emergency Medicine

## 2014-03-03 ENCOUNTER — Telehealth: Payer: Self-pay | Admitting: Internal Medicine

## 2014-03-03 NOTE — Telephone Encounter (Signed)
Left message for pt to return phone call when message received.

## 2014-03-03 NOTE — Telephone Encounter (Signed)
Pt still have tooth pain, has 3 mo f/u on 03/13/14 but would like to know what to do in the mean time because out of pain medications. Please f/u with pt.

## 2014-03-04 ENCOUNTER — Ambulatory Visit: Payer: Self-pay | Admitting: Internal Medicine

## 2014-03-13 ENCOUNTER — Ambulatory Visit (HOSPITAL_BASED_OUTPATIENT_CLINIC_OR_DEPARTMENT_OTHER): Payer: Medicaid Other | Admitting: Internal Medicine

## 2014-03-13 ENCOUNTER — Encounter (HOSPITAL_COMMUNITY): Payer: Self-pay | Admitting: Emergency Medicine

## 2014-03-13 ENCOUNTER — Emergency Department (HOSPITAL_COMMUNITY): Payer: Medicaid Other

## 2014-03-13 ENCOUNTER — Inpatient Hospital Stay (HOSPITAL_COMMUNITY)
Admission: EM | Admit: 2014-03-13 | Discharge: 2014-03-15 | DRG: 683 | Disposition: A | Discharge status: Home / Self Care | Payer: Medicaid Other | Attending: Internal Medicine | Admitting: Internal Medicine

## 2014-03-13 ENCOUNTER — Encounter: Payer: Self-pay | Admitting: Internal Medicine

## 2014-03-13 VITALS — BP 89/59 | HR 70 | Temp 98.0°F | Resp 14 | Ht 60.0 in | Wt 176.2 lb

## 2014-03-13 DIAGNOSIS — K0381 Cracked tooth: Secondary | ICD-10-CM | POA: Insufficient documentation

## 2014-03-13 DIAGNOSIS — F1410 Cocaine abuse, uncomplicated: Secondary | ICD-10-CM | POA: Diagnosis present

## 2014-03-13 DIAGNOSIS — I161 Hypertensive emergency: Secondary | ICD-10-CM

## 2014-03-13 DIAGNOSIS — Z79899 Other long term (current) drug therapy: Secondary | ICD-10-CM | POA: Insufficient documentation

## 2014-03-13 DIAGNOSIS — E785 Hyperlipidemia, unspecified: Secondary | ICD-10-CM | POA: Diagnosis present

## 2014-03-13 DIAGNOSIS — K0889 Other specified disorders of teeth and supporting structures: Secondary | ICD-10-CM | POA: Diagnosis present

## 2014-03-13 DIAGNOSIS — E7800 Pure hypercholesterolemia, unspecified: Secondary | ICD-10-CM | POA: Diagnosis present

## 2014-03-13 DIAGNOSIS — D649 Anemia, unspecified: Secondary | ICD-10-CM

## 2014-03-13 DIAGNOSIS — D638 Anemia in other chronic diseases classified elsewhere: Secondary | ICD-10-CM | POA: Diagnosis present

## 2014-03-13 DIAGNOSIS — I5023 Acute on chronic systolic (congestive) heart failure: Secondary | ICD-10-CM

## 2014-03-13 DIAGNOSIS — Z9851 Tubal ligation status: Secondary | ICD-10-CM

## 2014-03-13 DIAGNOSIS — K089 Disorder of teeth and supporting structures, unspecified: Secondary | ICD-10-CM

## 2014-03-13 DIAGNOSIS — I5021 Acute systolic (congestive) heart failure: Secondary | ICD-10-CM

## 2014-03-13 DIAGNOSIS — Z9189 Other specified personal risk factors, not elsewhere classified: Secondary | ICD-10-CM

## 2014-03-13 DIAGNOSIS — F17200 Nicotine dependence, unspecified, uncomplicated: Secondary | ICD-10-CM

## 2014-03-13 DIAGNOSIS — D259 Leiomyoma of uterus, unspecified: Secondary | ICD-10-CM

## 2014-03-13 DIAGNOSIS — J45909 Unspecified asthma, uncomplicated: Secondary | ICD-10-CM | POA: Diagnosis present

## 2014-03-13 DIAGNOSIS — J4489 Other specified chronic obstructive pulmonary disease: Secondary | ICD-10-CM | POA: Diagnosis present

## 2014-03-13 DIAGNOSIS — E860 Dehydration: Secondary | ICD-10-CM

## 2014-03-13 DIAGNOSIS — I5042 Chronic combined systolic (congestive) and diastolic (congestive) heart failure: Secondary | ICD-10-CM

## 2014-03-13 DIAGNOSIS — Z825 Family history of asthma and other chronic lower respiratory diseases: Secondary | ICD-10-CM

## 2014-03-13 DIAGNOSIS — N179 Acute kidney failure, unspecified: Principal | ICD-10-CM | POA: Diagnosis present

## 2014-03-13 DIAGNOSIS — Z8249 Family history of ischemic heart disease and other diseases of the circulatory system: Secondary | ICD-10-CM

## 2014-03-13 DIAGNOSIS — I5022 Chronic systolic (congestive) heart failure: Secondary | ICD-10-CM

## 2014-03-13 DIAGNOSIS — R0683 Snoring: Secondary | ICD-10-CM

## 2014-03-13 DIAGNOSIS — I501 Left ventricular failure: Secondary | ICD-10-CM

## 2014-03-13 DIAGNOSIS — K029 Dental caries, unspecified: Secondary | ICD-10-CM | POA: Diagnosis present

## 2014-03-13 DIAGNOSIS — I509 Heart failure, unspecified: Secondary | ICD-10-CM | POA: Diagnosis present

## 2014-03-13 DIAGNOSIS — D500 Iron deficiency anemia secondary to blood loss (chronic): Secondary | ICD-10-CM

## 2014-03-13 DIAGNOSIS — R42 Dizziness and giddiness: Secondary | ICD-10-CM

## 2014-03-13 DIAGNOSIS — R079 Chest pain, unspecified: Secondary | ICD-10-CM

## 2014-03-13 DIAGNOSIS — R0600 Dyspnea, unspecified: Secondary | ICD-10-CM

## 2014-03-13 DIAGNOSIS — I340 Nonrheumatic mitral (valve) insufficiency: Secondary | ICD-10-CM

## 2014-03-13 DIAGNOSIS — E861 Hypovolemia: Secondary | ICD-10-CM | POA: Diagnosis present

## 2014-03-13 DIAGNOSIS — J449 Chronic obstructive pulmonary disease, unspecified: Secondary | ICD-10-CM | POA: Diagnosis present

## 2014-03-13 DIAGNOSIS — R0789 Other chest pain: Secondary | ICD-10-CM | POA: Diagnosis present

## 2014-03-13 DIAGNOSIS — I10 Essential (primary) hypertension: Secondary | ICD-10-CM | POA: Diagnosis present

## 2014-03-13 DIAGNOSIS — Z0000 Encounter for general adult medical examination without abnormal findings: Secondary | ICD-10-CM

## 2014-03-13 DIAGNOSIS — N920 Excessive and frequent menstruation with regular cycle: Secondary | ICD-10-CM

## 2014-03-13 LAB — CBC
HCT: 29.3 % — ABNORMAL LOW (ref 36.0–46.0)
HCT: 28.9 % — ABNORMAL LOW (ref 36.0–46.0)
HEMOGLOBIN: 10 g/dL — AB (ref 12.0–15.0)
Hemoglobin: 9.5 g/dL — ABNORMAL LOW (ref 12.0–15.0)
MCH: 31 pg (ref 26.0–34.0)
MCH: 30.5 pg (ref 26.0–34.0)
MCHC: 34.1 g/dL (ref 30.0–36.0)
MCHC: 32.9 g/dL (ref 30.0–36.0)
MCV: 90.7 fL (ref 78.0–100.0)
MCV: 92.9 fL (ref 78.0–100.0)
PLATELETS: 375 10*3/uL (ref 150–400)
PLATELETS: 340 10*3/uL (ref 150–400)
RBC: 3.23 MIL/uL — ABNORMAL LOW (ref 3.87–5.11)
RBC: 3.11 MIL/uL — ABNORMAL LOW (ref 3.87–5.11)
RDW: 12.6 % (ref 11.5–15.5)
RDW: 12.5 % (ref 11.5–15.5)
WBC: 9.4 10*3/uL (ref 4.0–10.5)
WBC: 9.9 10*3/uL (ref 4.0–10.5)

## 2014-03-13 LAB — I-STAT TROPONIN, ED: Troponin i, poc: 0 ng/mL (ref 0.00–0.08)

## 2014-03-13 MED ORDER — AMOXICILLIN 500 MG PO CAPS
500.0000 mg | ORAL_CAPSULE | Freq: Three times a day (TID) | ORAL | Status: DC
Start: 2014-03-13 — End: 2014-04-07

## 2014-03-13 MED ORDER — ACETAMINOPHEN-CODEINE #3 300-30 MG PO TABS: 1.0000 | ORAL_TABLET | Freq: Three times a day (TID) | ORAL | Status: DC | PRN

## 2014-03-13 MED ORDER — OXYCODONE-ACETAMINOPHEN 5-325 MG PO TABS
2.0000 | ORAL_TABLET | Freq: Once | ORAL | Status: AC
  Administered 2014-03-14: 2 via ORAL
  Filled 2014-03-13: qty 2

## 2014-03-13 MED ORDER — KETOROLAC TROMETHAMINE 30 MG/ML IJ SOLN
30.0000 mg | Freq: Once | INTRAMUSCULAR | Status: AC
  Administered 2014-03-13: 30 mg via INTRAMUSCULAR

## 2014-03-13 MED ORDER — SODIUM CHLORIDE 0.9 % IV BOLUS (SEPSIS)
1000.0000 mL | Freq: Once | INTRAVENOUS | Status: AC
  Administered 2014-03-14: 1000 mL via INTRAVENOUS

## 2014-03-13 MED ORDER — ASPIRIN 81 MG PO CHEW
324.0000 mg | CHEWABLE_TABLET | Freq: Once | ORAL | Status: AC
  Administered 2014-03-14: 324 mg via ORAL
  Filled 2014-03-13: qty 4

## 2014-03-13 NOTE — Progress Notes (Signed)
Patient is here to F/U for HTN States 3 month history of right-sided teeth pain causing headaches on right side.

## 2014-03-13 NOTE — ED Notes (Signed)
Pt reports that she has had a "bad cough" with increased ShOB for the past 2 days, began having centralized CP and felt lightheaded today. Pt denies n/v/d, reports having back pain and occasionally smokes.  Pt a&o x4, tearful in triage.

## 2014-03-13 NOTE — ED Provider Notes (Signed)
CSN: [id number]     Arrival date & time 03/13/14  2203 History   First MD Initiated Contact with Patient 03/13/14 2237     Chief Complaint  Patient presents with  . Chest Pain  . Dizziness     (Consider location/radiation/quality/duration/timing/severity/associated sxs/prior Treatment) HPI 48 year old female presents with multiple complaints. She's initially complaining about chest pain. She states this started tonight along with a productive cough. She later states that this started a couple days ago and has been intermittent. She describes a throbbing pain in the middle of her chest. It worsens with coughing. She is a chronic cough that has worsened recently as well. She is also intermittently dyspneic. Has chronic orthopnea but this is been improving recently. She states she's gained weight since her last admission in November 2014 has not noticed any peripheral edema. She's also complaining of upper abdominal pain is concerned she has an ulcer. However, she states her most severe pain is in her teeth. 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 She saw her primary care physician about this this morning and they wrote her Tylenol 3 which she states does not do anything for her. Denies any facial swelling or fevers. No current chest pain. She states she has been under a lot of stress recently and notes she has extra family living in her house.   Past Medical History  Diagnosis Date  . Hypertension   . Anemia   . Arrhythmia   . Hypercholesteremia   . Chronic headaches    Past Surgical History  Procedure Laterality Date  . Tubal ligation  1986   Family History  Problem Relation Age of Onset  . Hypertension Mother   . Hypertension Father   . Hypertension Maternal Grandmother   . Allergies Mother   . Asthma Mother   . Heart disease Mother    History  Substance Use Topics  . Smoking status:  Current Some Day Smoker -- 0.10 packs/day for 20 years    Types: Cigarettes  . Smokeless tobacco: Never Used     Comment: Smokes occasionally when someone has a cig to have, doesn't buy them.  . Alcohol Use: No   OB History   Grav Para Term Preterm Abortions TAB SAB Ect Mult Living                 Review of Systems  Constitutional: Negative for fever.  HENT: Positive for dental problem. Negative for facial swelling.   Respiratory: Positive for cough and shortness of breath.   Cardiovascular: Positive for chest pain. Negative for leg swelling.  Gastrointestinal: Positive for abdominal pain. Negative for vomiting.  Genitourinary: Negative for dysuria.  Neurological: Positive for dizziness and light-headedness.  All other systems reviewed and are negative.     Allergies  Review of patient's allergies indicates no known allergies.  Home Medications   Prior to Admission medications   Medication Sig Start Date End Date Taking? Authorizing Provider  albuterol (VENTOLIN HFA) 108 (90 BASE) MCG/ACT inhaler Inhale 2 puffs into the lungs 4 (four) times daily as needed for wheezing. 01/02/14  Yes Theodis Blaze, MD  atorvastatin (LIPITOR) 40 MG tablet Take 1 tablet (40 mg total) by mouth daily at 6 PM. 01/10/14  Yes Jolaine Artist, MD  carvedilol (COREG) 6.25 MG tablet Take 9.375-12.5 mg by mouth 2 (two) times daily with a meal. Take 1.5  tablets in the morning Take 2 tablets in the evening   Yes Historical Provider, MD  furosemide (LASIX) 40 MG tablet Take 2 tablets (80 mg total) by mouth 2 (two) times daily. 02/24/14  Yes Shaune Pascal Bensimhon, MD  lisinopril (PRINIVIL,ZESTRIL) 5 MG tablet Take 2.5-5 mg by mouth daily. Take 0.5  tablet in the morning Take 1 tablet in the evening   Yes Historical Provider, MD  spironolactone (ALDACTONE) 25 MG tablet Take 1 tablet (25 mg total) by mouth every evening. 01/10/14  Yes Jolaine Artist, MD  acetaminophen-codeine (TYLENOL #3) 300-30 MG per tablet  Take 1 tablet by mouth every 8 (eight) hours as needed for moderate pain. 03/13/14   Lance Bosch, NP  amoxicillin (AMOXIL) 500 MG capsule Take 1 capsule (500 mg total) by mouth 3 (three) times daily. 03/13/14   Lance Bosch, NP   BP 107/62  Pulse 67  Resp 16  SpO2 96%  LMP 03/05/2014 Physical Exam  Vitals reviewed. Constitutional: She is oriented to person, place, and time. She appears well-developed and well-nourished.  HENT:  Head: Normocephalic and atraumatic.  Right Ear: External ear normal.  Left Ear: External ear normal.  Nose: Nose normal.  Mouth/Throat: Uvula is midline. No trismus in the jaw. Abnormal dentition. Dental caries present. No dental abscesses.  Diffusely poor dentition. Multiple areas of decaying teeth. No obvious abscess. Right maxillary canine is loose, this is chronic per patient.   Eyes: Right eye exhibits no discharge. Left eye exhibits no discharge.  Cardiovascular: Normal rate, regular rhythm and normal heart sounds.   Pulmonary/Chest: Effort normal and breath sounds normal. She exhibits tenderness.  Abdominal: Soft. There is tenderness (mild) in the epigastric area.  Musculoskeletal: She exhibits no edema and no tenderness.  Neurological: She is alert and oriented to person, place, and time.  Skin: Skin is warm and dry.    ED Course  Procedures (including critical care time) Labs Review Labs Reviewed  CBC - Abnormal; Notable for the following:    RBC 3.11 (*)    Hemoglobin 9.5 (*)    HCT 28.9 (*)    All other components within normal limits  COMPREHENSIVE METABOLIC PANEL - Abnormal; Notable for the following:    Potassium 3.6 (*)    Glucose, Bld 133 (*)    BUN 48 (*)    Creatinine, Ser 1.77 (*)    Alkaline Phosphatase 135 (*)    Total Bilirubin <0.2 (*)    GFR calc non Af Amer 33 (*)    GFR calc Af Amer 38 (*)    All other components within normal limits  URINALYSIS, ROUTINE W REFLEX MICROSCOPIC - Abnormal; Notable for the following:     Hgb urine dipstick MODERATE (*)    Leukocytes, UA SMALL (*)    All other components within normal limits  PRO B NATRIURETIC PEPTIDE - Abnormal; Notable for the following:    Pro B Natriuretic peptide (BNP) 130.6 (*)    All other components within normal limits  URINE MICROSCOPIC-ADD ON - Abnormal; Notable for the following:    Casts HYALINE CASTS (*)    All other components within normal limits  LIPASE, BLOOD  I-STAT TROPOININ, ED  POC URINE PREG, ED    Imaging Review Dg Chest 2 View  03/14/2014   CLINICAL DATA:  Chest pain.  Shortness of breath.  Dizziness.  EXAM: CHEST  2 VIEW  COMPARISON:  Chest x-ray 08/21/2013.  FINDINGS: Lung volumes are normal. No consolidative airspace  disease. No pleural effusions. No evidence of pulmonary edema. No pneumothorax. No suspicious appearing pulmonary nodules or masses. Heart size is mildly enlarged, but improved compared to the prior study. Upper mediastinal contours are within normal limits. Atherosclerosis in the thoracic aorta.  IMPRESSION: 1. No radiographic evidence of acute cardiopulmonary disease. 2. Mild cardiomegaly, improved compared to the prior study. 3. Atherosclerosis.   Electronically Signed   By: Vinnie Langton M.D.   On: 03/14/2014 00:02     EKG Interpretation   Date/Time:  Thursday Mar 13 2014 22:10:47 EDT Ventricular Rate:  68 PR Interval:  183 QRS Duration: 78 QT Interval:  413 QTC Calculation: 439 R Axis:   32 Text Interpretation:  Sinus rhythm Probable left atrial enlargement Low  voltage, precordial leads Borderline T abnormalities, anterior leads  repolarization abdnormality changes lateral leads on prior ECG resolved on  current ecg Confirmed by KNAPP  MD-J, JON ([id number]) on 03/13/2014 10:13:37 PM      MDM   Final diagnoses:  Chest pain  Dehydration    Patient endorses having diarrhea feeling lightheaded. Her blood pressures here are soft, systolic has been between 80s and 90s. Her mental status is been normal.  She will be hydrated with fluids. Her metabolic panel shows acute kidney injury, most recent creatinine of 1 and now it is 1.7. His elevated BUN suggesting this is likely prerenal given her dehydration. Her chest pain is nonspecific in her EKG shows nonspecific T wave abnormalities but improved from November when she had cardiac issues. At this point she has a negative troponin, but given her dehydration an AKI will need admission to the hospitalist.    Ephraim Hamburger, MD 03/14/14 [phone]

## 2014-03-13 NOTE — Progress Notes (Signed)
Patient ID: Stacey Lin, female   DOB: 17-Jun-1966, 48 y.o.   MRN: [id number]  CC: dental pain   HPI: Patient reports that she has had dental pain for over three months.  Patient reports that she has pulled her own teeth because of the pain.  Denies fevers or chills. She admits that she never filled the last prescription of tylenol 3 because she ddi not feel like it would help her pain.    No Active Allergies Past Medical History  Diagnosis Date  . Hypertension   . Anemia   . Arrhythmia   . Hypercholesteremia   . Chronic headaches    Current Outpatient Prescriptions on File Prior to Visit  Medication Sig Dispense Refill  . albuterol (VENTOLIN HFA) 108 (90 BASE) MCG/ACT inhaler Inhale 2 puffs into the lungs 4 (four) times daily as needed for wheezing.  1 Inhaler  11  . atorvastatin (LIPITOR) 40 MG tablet Take 1 tablet (40 mg total) by mouth daily at 6 PM.  30 tablet  11  . carvedilol (COREG) 6.25 MG tablet Take 9.375 mg (1 1/2 tablets) in the morning and 12.5 mg (2 tablets) in the evening  105 tablet  3  . furosemide (LASIX) 40 MG tablet Take 2 tablets (80 mg total) by mouth 2 (two) times daily.  120 tablet  11  . lisinopril (PRINIVIL,ZESTRIL) 5 MG tablet Take 2.5 mg (1/2 tablet) every morning and 5 mg (1 tablet) every evening.  45 tablet  11  . spironolactone (ALDACTONE) 25 MG tablet Take 1 tablet (25 mg total) by mouth every evening.  30 tablet  7  . acetaminophen-codeine (TYLENOL #3) 300-30 MG per tablet Take 1 tablet by mouth every 6 (six) hours as needed for moderate pain.  30 tablet  0   No current facility-administered medications on file prior to visit.   Family History  Problem Relation Age of Onset  . Hypertension Mother   . Hypertension Father   . Hypertension Maternal Grandmother   . Allergies Mother   . Asthma Mother   . Heart disease Mother    History   Social History  . Marital Status: Legally Separated    Spouse Name: N/A    Number of Children: N/A  .  Years of Education: N/A   Occupational History  . Not on file.   Social History Main Topics  . Smoking status: Current Some Day Smoker -- 0.10 packs/day for 20 years    Types: Cigarettes  . Smokeless tobacco: Never Used     Comment: Smokes occasionally when someone has a cig to have, doesn't buy them.  . Alcohol Use: No  . Drug Use: No     Comment: former, stopped smoking crack X3 years ago  . Sexual Activity: Not on file   Other Topics Concern  . Not on file   Social History Narrative  . No narrative on file   ROS   Objective:   Filed Vitals:   03/13/14 1146  BP: 89/59  Pulse: 70  Temp: 98 F (36.7 C)  Resp: 14    Physical Exam: Constitutional: Patient appears well-developed and well-nourished. No distress. HENT: Normocephalic, atraumatic, External right and left ear normal. Oropharynx is clear and moist. Very poor dentition, cracked tooth noted on left side. Eyes:  PERRLA, no scleral icterus. Neck: Normal ROM. Neck supple.  CVS: RRR, S1/S2 +, no murmurs, no gallops, no carotid bruit.  Pulmonary: Effort and breath sounds normal, no stridor, rhonchi, wheezes, rales.  Lymphadenopathy: No lymphadenopathy noted, cervical,  Skin: Skin is warm and dry. No rash noted. Not diaphoretic. No erythema. No pallor. Psychiatric: Normal mood and affect. Behavior, judgment, thought content normal.  Lab Results  Component Value Date   WBC 8.1 11/08/2013   HGB 10.3* 11/08/2013   HCT 31.5* 11/08/2013   MCV 92.4 11/08/2013   PLT 249 11/08/2013   Lab Results  Component Value Date   CREATININE 1.03 08/28/2013   BUN 18 08/28/2013   NA 137 08/28/2013   K 4.6 08/28/2013   CL 103 08/28/2013   CO2 26 08/28/2013    Lab Results  Component Value Date   HGBA1C 5.7* 08/20/2013   Lipid Panel     Component Value Date/Time   CHOL 143 08/20/2013 0505   TRIG 97 08/20/2013 0505   HDL 40 08/20/2013 0505   CHOLHDL 3.6 08/20/2013 0505   VLDL 19 08/20/2013 0505   LDLCALC 84 08/20/2013 0505        Assessment and plan:   [name] was seen today for follow-up, hypertension and dental pain.  Diagnoses and associated orders for this visit:  Pain, dental - Ambulatory referral to Dentistry - amoxicillin (AMOXIL) 500 MG capsule; Take 1 capsule (500 mg total) by mouth 3 (three) times daily. - ketorolac (TORADOL) 30 MG/ML injection 30 mg; Inject 1 mL (30 mg total) into the muscle once. - acetaminophen-codeine (TYLENOL #3) 300-30 MG per tablet; Take 1 tablet by mouth every 8 (eight) hours as needed for moderate pain. Patient is angry because she wants Percocets, explained that per protocol we can only give Tramadol or Tylenol 3 in this office. Patient reports that she was given Percocet's by other provider in office. Was explained that pain medication will not fix tooth pain, that she must see a dentist.  Anemia - CBC  Return if symptoms worsen or fail to improve.      Holland Commons, NP-C [facility] [phone] 03/13/2014, 12:15 PM

## 2014-03-13 NOTE — Patient Instructions (Signed)
Dental Caries   Dental caries (also called tooth decay) is the most common oral disease. It can occur at any age, but is more common in children and young adults.   HOW DENTAL CARIES DEVELOPS   The process of decay begins when bacteria and foods (particularly sugars and starches) combine in your mouth to produce plaque. Plaque is a substance that sticks to the hard, outer surface of a tooth (enamel). The bacteria in plaque produce acids that attack enamel. These acids may also attack the root surface of a tooth (cementum) if it is exposed. Repeated attacks dissolve these surfaces and create holes in the tooth (cavities). If left untreated, the acids destroy the other layers of the tooth.   RISK FACTORS  · Frequent sipping of sugary beverages.    · Frequent snacking on sugary and starchy foods, especially those that easily get stuck in the teeth.    · Poor oral hygiene.    · Dry mouth.    · Substance abuse such as methamphetamine abuse.    · Broken or poor-fitting dental restorations.    · Eating disorders.    · Gastroesophageal reflux disease (GERD).    · Certain radiation treatments to the head and neck.  SYMPTOMS  In the early stages of dental caries, symptoms are seldom present. Sometimes white, chalky areas may be seen on the enamel or other tooth layers. In later stages, symptoms may include:  · Pits and holes on the enamel.  · Toothache after sweet, hot, or cold foods or drinks are consumed.  · Pain around the tooth.  · Swelling around the tooth.  DIAGNOSIS   Most of the time, dental caries is detected during a regular dental checkup. A diagnosis is made after a thorough medical and dental history is taken and the surfaces of your teeth are checked for signs of dental caries. Sometimes special instruments, such as lasers, are used to check for dental caries. Dental X-ray exams may be taken so that areas not visible to the eye (such as between the contact areas of the teeth) can be checked for cavities.    TREATMENT   If dental caries is in its early stages, it may be reversed with a fluoride treatment or an application of a remineralizing agent at the dental office. Thorough brushing and flossing at home is needed to aid these treatments. If it is in its later stages, treatment depends on the location and extent of tooth destruction:   · If a small area of the tooth has been destroyed, the destroyed area will be removed and cavities will be filled with a material such as gold, silver amalgam, or composite resin.    · If a large area of the tooth has been destroyed, the destroyed area will be removed and a cap (crown) will be fitted over the remaining tooth structure.    · If the center part of the tooth (pulp) is affected, a procedure called a root canal will be needed before a filling or crown can be placed.    · If most of the tooth has been destroyed, the tooth may need to be pulled (extracted).  HOME CARE INSTRUCTIONS  You can prevent, stop, or reverse dental caries at home by practicing good oral hygiene. Good oral hygiene includes:  · Thoroughly cleaning your teeth at least twice a day with a toothbrush and dental floss.    · Using a fluoride toothpaste. A fluoride mouth rinse may also be used if recommended by your dentist or health care provider.    ·   Restricting the amount of sugary and starchy foods and sugary liquids you consume.    · Avoiding frequent snacking on these foods and sipping of these liquids.    · Keeping regular visits with a dentist for checkups and cleanings.  PREVENTION   · Practice good oral hygiene.  · Consider a dental sealant. A dental sealant is a coating material that is applied by your dentist to the pits and grooves of teeth. The sealant prevents food from being trapped in them. It may protect the teeth for several years.  · Ask about fluoride supplements if you live in a community without fluorinated water or with water that has a low fluoride content. Use fluoride supplements  as directed by your dentist or health care provider.  · Allow fluoride varnish applications to teeth if directed by your dentist or health care provider.  Document Released: 07/02/2002 Document Revised: 06/12/2013 Document Reviewed: 10/12/2012  [employer]® Patient Information ©2014 [employer].

## 2014-03-14 ENCOUNTER — Inpatient Hospital Stay (HOSPITAL_COMMUNITY): Payer: Medicaid Other

## 2014-03-14 DIAGNOSIS — E861 Hypovolemia: Secondary | ICD-10-CM | POA: Diagnosis present

## 2014-03-14 DIAGNOSIS — J449 Chronic obstructive pulmonary disease, unspecified: Secondary | ICD-10-CM | POA: Diagnosis present

## 2014-03-14 DIAGNOSIS — Z9851 Tubal ligation status: Secondary | ICD-10-CM | POA: Diagnosis not present

## 2014-03-14 DIAGNOSIS — E785 Hyperlipidemia, unspecified: Secondary | ICD-10-CM | POA: Diagnosis present

## 2014-03-14 DIAGNOSIS — Z79899 Other long term (current) drug therapy: Secondary | ICD-10-CM | POA: Diagnosis not present

## 2014-03-14 DIAGNOSIS — K029 Dental caries, unspecified: Secondary | ICD-10-CM | POA: Diagnosis present

## 2014-03-14 DIAGNOSIS — R0789 Other chest pain: Secondary | ICD-10-CM | POA: Insufficient documentation

## 2014-03-14 DIAGNOSIS — I10 Essential (primary) hypertension: Secondary | ICD-10-CM | POA: Diagnosis present

## 2014-03-14 DIAGNOSIS — N179 Acute kidney failure, unspecified: Principal | ICD-10-CM

## 2014-03-14 DIAGNOSIS — D638 Anemia in other chronic diseases classified elsewhere: Secondary | ICD-10-CM | POA: Diagnosis present

## 2014-03-14 DIAGNOSIS — I5023 Acute on chronic systolic (congestive) heart failure: Secondary | ICD-10-CM

## 2014-03-14 DIAGNOSIS — I5042 Chronic combined systolic (congestive) and diastolic (congestive) heart failure: Secondary | ICD-10-CM

## 2014-03-14 DIAGNOSIS — F17200 Nicotine dependence, unspecified, uncomplicated: Secondary | ICD-10-CM | POA: Diagnosis present

## 2014-03-14 DIAGNOSIS — I509 Heart failure, unspecified: Secondary | ICD-10-CM

## 2014-03-14 DIAGNOSIS — E7800 Pure hypercholesterolemia, unspecified: Secondary | ICD-10-CM | POA: Diagnosis present

## 2014-03-14 DIAGNOSIS — E860 Dehydration: Secondary | ICD-10-CM

## 2014-03-14 DIAGNOSIS — F1410 Cocaine abuse, uncomplicated: Secondary | ICD-10-CM | POA: Diagnosis present

## 2014-03-14 DIAGNOSIS — I5022 Chronic systolic (congestive) heart failure: Secondary | ICD-10-CM

## 2014-03-14 DIAGNOSIS — R079 Chest pain, unspecified: Secondary | ICD-10-CM

## 2014-03-14 DIAGNOSIS — Z825 Family history of asthma and other chronic lower respiratory diseases: Secondary | ICD-10-CM | POA: Diagnosis not present

## 2014-03-14 DIAGNOSIS — Z8249 Family history of ischemic heart disease and other diseases of the circulatory system: Secondary | ICD-10-CM | POA: Diagnosis not present

## 2014-03-14 LAB — TROPONIN I
Troponin I: <0.3 ng/mL (ref <0.30)
Troponin I: <0.3 ng/mL (ref <0.30)
Troponin I: <0.3 ng/mL (ref <0.30)

## 2014-03-14 LAB — URINALYSIS, ROUTINE W REFLEX MICROSCOPIC
Bilirubin Urine: NEGATIVE
GLUCOSE, UA: NEGATIVE mg/dL
KETONES UR: NEGATIVE mg/dL
Nitrite: NEGATIVE
PH: 5.5 (ref 5.0–8.0)
Protein, ur: NEGATIVE mg/dL
Specific Gravity, Urine: 1.01 (ref 1.005–1.030)
Urobilinogen, UA: 0.2 mg/dL (ref 0.0–1.0)

## 2014-03-14 LAB — RAPID URINE DRUG SCREEN, HOSP PERFORMED
AMPHETAMINES: NOT DETECTED
Barbiturates: NOT DETECTED
Benzodiazepines: NOT DETECTED
Cocaine: NOT DETECTED
Opiates: NOT DETECTED
TETRAHYDROCANNABINOL: NOT DETECTED

## 2014-03-14 LAB — COMPREHENSIVE METABOLIC PANEL
ALBUMIN: 3.7 g/dL (ref 3.5–5.2)
ALT: 14 U/L (ref 0–35)
AST: 17 U/L (ref 0–37)
Alkaline Phosphatase: 135 U/L — ABNORMAL HIGH (ref 39–117)
BUN: 48 mg/dL — ABNORMAL HIGH (ref 6–23)
CO2: 26 mEq/L (ref 19–32)
CREATININE: 1.77 mg/dL — AB (ref 0.50–1.10)
Calcium: 9.3 mg/dL (ref 8.4–10.5)
Chloride: 99 mEq/L (ref 96–112)
GFR calc Af Amer: 38 mL/min — ABNORMAL LOW (ref >90)
GFR calc non Af Amer: 33 mL/min — ABNORMAL LOW (ref >90)
Glucose, Bld: 133 mg/dL — ABNORMAL HIGH (ref 70–99)
Potassium: 3.6 mEq/L — ABNORMAL LOW (ref 3.7–5.3)
Sodium: 140 mEq/L (ref 137–147)
Total Protein: 7.3 g/dL (ref 6.0–8.3)

## 2014-03-14 LAB — PRO B NATRIURETIC PEPTIDE: PRO B NATRI PEPTIDE: 130.6 pg/mL — AB (ref 0–125)

## 2014-03-14 LAB — URINE MICROSCOPIC-ADD ON

## 2014-03-14 LAB — LIPASE, BLOOD: Lipase: 54 U/L (ref 11–59)

## 2014-03-14 LAB — CK: Total CK: 389 U/L — ABNORMAL HIGH (ref 7–177)

## 2014-03-14 LAB — POC URINE PREG, ED: PREG TEST UR: NEGATIVE

## 2014-03-14 LAB — D-DIMER, QUANTITATIVE: D-Dimer, Quant: <0.27 ug/mL-FEU (ref 0.00–0.48)

## 2014-03-14 MED ORDER — SODIUM CHLORIDE 0.9 % IV BOLUS (SEPSIS)
1000.0000 mL | Freq: Once | INTRAVENOUS | Status: AC
  Administered 2014-03-14: 1000 mL via INTRAVENOUS

## 2014-03-14 MED ORDER — ATORVASTATIN CALCIUM 40 MG PO TABS
40.0000 mg | ORAL_TABLET | Freq: Every day | ORAL | Status: DC
  Administered 2014-03-14: 40 mg via ORAL
  Filled 2014-03-14 (×2): qty 1

## 2014-03-14 MED ORDER — SODIUM CHLORIDE 0.9 % IJ SOLN
3.0000 mL | Freq: Two times a day (BID) | INTRAMUSCULAR | Status: DC
  Administered 2014-03-14 – 2014-03-15 (×3): 3 mL via INTRAVENOUS

## 2014-03-14 MED ORDER — AMOXICILLIN-POT CLAVULANATE 875-125 MG PO TABS
1.0000 | ORAL_TABLET | Freq: Two times a day (BID) | ORAL | Status: DC
  Administered 2014-03-14 – 2014-03-15 (×3): 1 via ORAL
  Filled 2014-03-14 (×4): qty 1

## 2014-03-14 MED ORDER — GI COCKTAIL ~~LOC~~
30.0000 mL | Freq: Three times a day (TID) | ORAL | Status: DC | PRN
  Filled 2014-03-14: qty 30

## 2014-03-14 MED ORDER — PANTOPRAZOLE SODIUM 40 MG PO TBEC
40.0000 mg | DELAYED_RELEASE_TABLET | Freq: Every day | ORAL | Status: DC
  Administered 2014-03-14 – 2014-03-15 (×2): 40 mg via ORAL
  Filled 2014-03-14 (×2): qty 1

## 2014-03-14 MED ORDER — ASPIRIN EC 325 MG PO TBEC
325.0000 mg | DELAYED_RELEASE_TABLET | Freq: Every day | ORAL | Status: DC
  Administered 2014-03-14 – 2014-03-15 (×2): 325 mg via ORAL
  Filled 2014-03-14 (×2): qty 1

## 2014-03-14 MED ORDER — SODIUM CHLORIDE 0.9 % IV SOLN
INTRAVENOUS | Status: DC
  Administered 2014-03-14: 06:00:00 via INTRAVENOUS
  Filled 2014-03-14 (×2): qty 1000

## 2014-03-14 MED ORDER — ACETAMINOPHEN 650 MG RE SUPP: 650.0000 mg | Freq: Four times a day (QID) | RECTAL | Status: DC | PRN

## 2014-03-14 MED ORDER — ACETAMINOPHEN 325 MG PO TABS
650.0000 mg | ORAL_TABLET | Freq: Four times a day (QID) | ORAL | Status: DC | PRN
  Administered 2014-03-14 – 2014-03-15 (×2): 650 mg via ORAL
  Filled 2014-03-14 (×2): qty 2

## 2014-03-14 MED ORDER — ENOXAPARIN SODIUM 40 MG/0.4ML ~~LOC~~ SOLN
40.0000 mg | SUBCUTANEOUS | Status: DC
  Administered 2014-03-14 – 2014-03-15 (×2): 40 mg via SUBCUTANEOUS
  Filled 2014-03-14 (×2): qty 0.4

## 2014-03-14 MED ORDER — OXYCODONE-ACETAMINOPHEN 5-325 MG PO TABS
2.0000 | ORAL_TABLET | Freq: Once | ORAL | Status: AC
  Administered 2014-03-14: 2 via ORAL
  Filled 2014-03-14: qty 2

## 2014-03-14 MED ORDER — DOCUSATE SODIUM 100 MG PO CAPS
100.0000 mg | ORAL_CAPSULE | Freq: Two times a day (BID) | ORAL | Status: DC
  Administered 2014-03-14 – 2014-03-15 (×2): 100 mg via ORAL
  Filled 2014-03-14 (×3): qty 1

## 2014-03-14 NOTE — Progress Notes (Signed)
[facility] PROGRESS NOTE  Stacey Lin EAV:[id number] DOB: 22-Nov-1965 DOA: 03/13/2014 PCP: Holland Commons, NP  Assessment/Plan  Atypical chest pain - Troponins so far negative - EKG showed nonspecific T-wave changes, sinus rhythm  - Urine drug screen negative  AKI, Likely volume depletion in the setting of diarrhea with concomitant use of Lasix, Aldactone and lisinopril  - Judicious IV fluids  - Hold diuretics and ACE inhibitor  - Renal ultrasound wnl - CPK only minimally elevated  Dental pain with dental caries  - orthopantogram  - start empiric Augmentin   Diarrhea, resolved.  Last loose BM on Tuesday and has had normal since.    Abdominal pain, LFTs wnl, lipase wnl, UA neg -2 view abdominal x-ray:  Constipation -  Start colace, senna  Polysubstance abuse  -Cessation discussed  -Urine drug screen neg -HIV test   chronic systolic and diastolic CHF  -11/08/2013 EF 45%, grade 1 diastolic dysfunction  -Currently appears hypovolemic  -hold diuretics for the next 24 hours and reevaluate   Hypertension  -Hold diuretic, Coreg, lisinopril as the patient's blood pressure is soft   Diet:  Healthy heart Access:  PIV IVF:  yes Proph:  lovenox  Code Status: full Family Communication: patient alone Disposition Plan: likely home tomorrow if troponins neg, kidney function improving, and if tests above do not require inpatient follow up   Consultants:  None  Procedures:  CXR  RUS pending  Panorex pending  Antibiotics:  augmentin 5/22 >>   HPI/Subjective:  Intermittent left chest pains which radiate to her back on the left.  Better with changes in position, worse with lying down or with pressing on left ribs  Objective: Filed Vitals:   03/13/14 2211 03/14/14 0359 03/14/14 0400 03/14/14 0523  BP: 107/62  97/51 100/59  Pulse: 67  57 58  Temp:  97.7 F (36.5 C)  97.6 F (36.4 C)  TempSrc:  Oral  Oral  Resp: 16  14 22   Height:    5' (1.524 m)   Weight:    81.8 kg (180 lb 5.4 oz)  SpO2: 96%  100% 100%    Intake/Output Summary (Last 24 hours) at 03/14/14 1406 Last data filed at 03/14/14 7829  Gross per 24 hour  Intake    222 ml  Output    300 ml  Net    -78 ml   Filed Weights   03/14/14 0523  Weight: 81.8 kg (180 lb 5.4 oz)    Exam:   General:  AAF, No acute distress  HEENT:  NCAT, MMM  Cardiovascular:  RRR, nl S1, S2, 2/6 systolic murmur LSB, 2+ pulses, warm extremities  Respiratory:  CTAB, no increased WOB  Abdomen:   NABS, soft, NT/ND  MSK:   Normal tone and bulk, no LEE.  TTP with palpation of the left ribs.   Neuro:  Grossly intact  Data Reviewed: Basic Metabolic Panel:  Recent Labs Lab 03/13/14 2317  NA 140  K 3.6*  CL 99  CO2 26  GLUCOSE 133*  BUN 48*  CREATININE 1.77*  CALCIUM 9.3   Liver Function Tests:  Recent Labs Lab 03/13/14 2317  AST 17  ALT 14  ALKPHOS 135*  BILITOT <0.2*  PROT 7.3  ALBUMIN 3.7    Recent Labs Lab 03/13/14 2317  LIPASE 54   No results found for this basename: AMMONIA,  in the last 168 hours CBC:  Recent Labs Lab 03/13/14 1247 03/13/14 2317  WBC 9.4 9.9  HGB 10.0* 9.5*  HCT 29.3* 28.9*  MCV 90.7 92.9  PLT 375 340   Cardiac Enzymes:  Recent Labs Lab 03/14/14 0639 03/14/14 1145  CKTOTAL 389*  --   TROPONINI <0.30 <0.30   BNP (last 3 results)  Recent Labs  08/19/13 1754 03/13/14 2317  PROBNP 6945.0* 130.6*   CBG: No results found for this basename: GLUCAP,  in the last 168 hours  No results found for this or any previous visit (from the past 240 hour(s)).   Studies: Dg Chest 2 View  03/14/2014   CLINICAL DATA:  Chest pain.  Shortness of breath.  Dizziness.  EXAM: CHEST  2 VIEW  COMPARISON:  Chest x-ray 08/21/2013.  FINDINGS: Lung volumes are normal. No consolidative airspace disease. No pleural effusions. No evidence of pulmonary edema. No pneumothorax. No suspicious appearing pulmonary nodules or masses. Heart size is mildly  enlarged, but improved compared to the prior study. Upper mediastinal contours are within normal limits. Atherosclerosis in the thoracic aorta.  IMPRESSION: 1. No radiographic evidence of acute cardiopulmonary disease. 2. Mild cardiomegaly, improved compared to the prior study. 3. Atherosclerosis.   Electronically Signed   By: Trudie Reed M.D.   On: 03/14/2014 00:02    Scheduled Meds: . amoxicillin-clavulanate  1 tablet Oral Q12H  . aspirin EC  325 mg Oral Daily  . atorvastatin  40 mg Oral q1800  . enoxaparin (LOVENOX) injection  40 mg Subcutaneous Q24H  . sodium chloride  3 mL Intravenous Q12H   Continuous Infusions: . sodium chloride 0.9 % 1,000 mL with potassium chloride 20 mEq infusion 75 mL/hr at 03/14/14 4098    Active Problems:   Essential hypertension, benign   COPD   AKI (acute kidney injury)   Chronic combined systolic and diastolic congestive heart failure   Chest pain    Time spent: 30 min    Renae Fickle  [facility] Pager [phone]. If 7PM-7AM, please contact night-coverage at [email or web address], password [facility] 03/14/2014, 2:06 PM  LOS: 1 day

## 2014-03-14 NOTE — Progress Notes (Signed)
Pt admitted from ED. No distress at this time. VSS. Pt oriented to floor and room. Home medications collected by Probation officer and sent to pharmacy. Will continue to monitor pt closely. Stacey Lin

## 2014-03-14 NOTE — H&P (Signed)
History and Physical  Payson Madar UEA:[id number] DOB: August 17, 1966 DOA: 03/13/2014   PCP: Holland Commons, NP   Chief Complaint: Chest pain, mouth pain, diarrhea  HPI:  48 year old female with a history of hypertension, hyperlipidemia, polysubstance abuse including cocaine and tobacco presents with 1 week history of intermittent left-sided chest pain, dental pain, abdominal pain, and diarrhea. The patient has numerous complaints but her main concerns are as above. The patient states she has been having a nonproductive cough. She states that she is short of breath all the time, but states that this is not worse than usual. She denies any hemoptysis, fevers, chills, vomiting, dysuria, hematuria, hematochezia, melena. The patient went to see a physician at the [facility] on his clinic on the day prior to admission, and was given a prescription for Tylenol No. 3. She stated that this did not help her pain. As a result into the emergency department for further evaluation. She denies any recent cocaine use. She states that she has been clean for 3 years. She continues to smoke 2 cigarettes per day. She denies any alcohol use or marijuana use. She denies any recent travels, eating raw or undercooked foods. The patient was prescribed amoxicillin, but has not yet started the medicine.  In the emergency department, CBC was unremarkable with WBC 9.9, hemoglobin 9.5. Hepatic panel was unremarkable. BMP showed serum creatinine 1.77. Urinalysis was negative for pyuria. ProBNP was 130. Her pregnancy test was negative. Lipase was 54. Chest x-ray was negative for any acute findings Assessment/Plan: Atypical chest pain -Cycle troponins -EKG showed nonspecific T-wave changes, sinus rhythm -Urine drug screen AKI -Likely volume depletion in the setting of diarrhea with concomitant use of Lasix, Aldactone and lisinopril -Judicious IV fluids -Hold diuretics and ACE inhibitor -Renal  ultrasound -CPK Dental pain with dental caries -orthopantogram -start empiric Augmentin Diarrhea -Patient says that she has had 2-3 loose bowel movements daily for the past week -Stool pathogen panel  -C. difficile PCR  -HIV test  Abdominal pain  -2 view abdominal x-ray  Polysubstance abuse  -Cessation discussed  -Urine drug screen  -HIV test   chronic systolic and diastolic CHF  -11/08/2013 EF 45%, grade 1 diastolic dysfunction  -Currently appears hypovolemic  -hold diuretics for the next 24 hours and reevaluate  Hypertension  -Hold diuretic, Coreg, lisinopril as the patient's blood pressure is soft       Past Medical History  Diagnosis Date  . Hypertension   . Anemia   . Arrhythmia   . Hypercholesteremia   . Chronic headaches    Past Surgical History  Procedure Laterality Date  . Tubal ligation  1986   Social History:  reports that she has been smoking Cigarettes.  She has a 2 pack-year smoking history. She has never used smokeless tobacco. She reports that she does not drink alcohol or use illicit drugs.   Family History  Problem Relation Age of Onset  . Hypertension Mother   . Hypertension Father   . Hypertension Maternal Grandmother   . Allergies Mother   . Asthma Mother   . Heart disease Mother      No Known Allergies    Prior to Admission medications   Medication Sig Start Date End Date Taking? Authorizing Provider  albuterol (VENTOLIN HFA) 108 (90 BASE) MCG/ACT inhaler Inhale 2 puffs into the lungs 4 (four) times daily as needed for wheezing. 01/02/14  Yes Dorothea Ogle, MD  atorvastatin (LIPITOR) 40 MG tablet Take 1 tablet (  40 mg total) by mouth daily at 6 PM. 01/10/14  Yes Dolores Patty, MD  carvedilol (COREG) 6.25 MG tablet Take 9.375-12.5 mg by mouth 2 (two) times daily with a meal. Take 1.5 tablets in the morning Take 2 tablets in the evening   Yes Historical Provider, MD  furosemide (LASIX) 40 MG tablet Take 2 tablets (80 mg total) by  mouth 2 (two) times daily. 02/24/14  Yes Bevelyn Buckles Bensimhon, MD  lisinopril (PRINIVIL,ZESTRIL) 5 MG tablet Take 2.5-5 mg by mouth daily. Take 0.5  tablet in the morning Take 1 tablet in the evening   Yes Historical Provider, MD  spironolactone (ALDACTONE) 25 MG tablet Take 1 tablet (25 mg total) by mouth every evening. 01/10/14  Yes Dolores Patty, MD  acetaminophen-codeine (TYLENOL #3) 300-30 MG per tablet Take 1 tablet by mouth every 8 (eight) hours as needed for moderate pain. 03/13/14   Ambrose Finland, NP  amoxicillin (AMOXIL) 500 MG capsule Take 1 capsule (500 mg total) by mouth 3 (three) times daily. 03/13/14   Ambrose Finland, NP    Review of Systems:  Constitutional:  No weight loss, night sweats, Fevers, chills, fatigue.  Head&Eyes:  No vision loss.  No eye pain or scotoma ENT:  No Difficulty swallowing,Sore throat,  No ear ache, post nasal drip,  Cardio-vascular:  No Orthopnea, PND, swelling in lower extremities,  dizziness, palpitations  GI:  No  abdominal pain, nausea, vomiting, diarrhea, loss of appetite, hematochezia, melena, heartburn, indigestion, Resp:  No shortness of breath with exertion or at rest..No wheezing.No chest wall deformity  Skin:  no rash or lesions.  GU:  no dysuria, change in color of urine, no urgency or frequency. No flank pain.  Musculoskeletal:  No joint pain or swelling. No decreased range of motion. No back pain.  Psych:  No change in mood or affect.  Neurologic: No headache, no dysesthesia, no focal weakness, no vision loss. No syncope  Physical Exam: Filed Vitals:   03/13/14 2211 03/14/14 0359 03/14/14 0400 03/14/14 0523  BP: 107/62  97/51 100/59  Pulse: 67  57 58  Temp:  97.7 F (36.5 C)  97.6 F (36.4 C)  TempSrc:  Oral  Oral  Resp: 16  14 22   Height:    5' (1.524 m)  Weight:    81.8 kg (180 lb 5.4 oz)  SpO2: 96%  100% 100%   General:  A&O x 3, NAD, nontoxic, pleasant/cooperative Head/Eye: No conjunctival hemorrhage, no icterus,  [place]/AT, No nystagmus ENT:  No icterus,  No thrush, poor dentition, no pharyngeal exudate Neck:  No masses, no lymphadenpathy,  CV:  RRR, no rub, no gallop, no S3 Lung:  CTAB, good air movement, no wheeze, no rhonchi Abdomen: soft/epigatric tender without rebound, +BS, nondistended, no peritoneal signs Ext: No cyanosis, No rashes, No petechiae, No lymphangitis, No edema Neuro: CNII-XII intact, strength 4/5 in bilateral upper and lower extremities, no dysmetria  Labs on Admission:  Basic Metabolic Panel:  Recent Labs Lab 03/13/14 2317  NA 140  K 3.6*  CL 99  CO2 26  GLUCOSE 133*  BUN 48*  CREATININE 1.77*  CALCIUM 9.3   Liver Function Tests:  Recent Labs Lab 03/13/14 2317  AST 17  ALT 14  ALKPHOS 135*  BILITOT <0.2*  PROT 7.3  ALBUMIN 3.7    Recent Labs Lab 03/13/14 2317  LIPASE 54   No results found for this basename: AMMONIA,  in the last 168 hours CBC:  Recent  Labs Lab 03/13/14 1247 03/13/14 2317  WBC 9.4 9.9  HGB 10.0* 9.5*  HCT 29.3* 28.9*  MCV 90.7 92.9  PLT 375 340   Cardiac Enzymes: No results found for this basename: CKTOTAL, CKMB, CKMBINDEX, TROPONINI,  in the last 168 hours BNP: No components found with this basename: POCBNP,  CBG: No results found for this basename: GLUCAP,  in the last 168 hours  Radiological Exams on Admission: Dg Chest 2 View  03/14/2014   CLINICAL DATA:  Chest pain.  Shortness of breath.  Dizziness.  EXAM: CHEST  2 VIEW  COMPARISON:  Chest x-ray 08/21/2013.  FINDINGS: Lung volumes are normal. No consolidative airspace disease. No pleural effusions. No evidence of pulmonary edema. No pneumothorax. No suspicious appearing pulmonary nodules or masses. Heart size is mildly enlarged, but improved compared to the prior study. Upper mediastinal contours are within normal limits. Atherosclerosis in the thoracic aorta.  IMPRESSION: 1. No radiographic evidence of acute cardiopulmonary disease. 2. Mild cardiomegaly, improved compared  to the prior study. 3. Atherosclerosis.   Electronically Signed   By: Trudie Reed M.D.   On: 03/14/2014 00:02    EKG: Independently reviewed. Sinus rhythm, nonspecific T wave changes in V1, V2    Time spent:60 minutes Code Status:   FULL Family Communication: No  Family at bedside   Catarina Hartshorn, DO  [facility] Pager [phone]  If 7PM-7AM, please contact night-coverage [email or web address] Password [facility] 03/14/2014, 6:16 AM

## 2014-03-15 DIAGNOSIS — R0789 Other chest pain: Secondary | ICD-10-CM

## 2014-03-15 DIAGNOSIS — K089 Disorder of teeth and supporting structures, unspecified: Secondary | ICD-10-CM

## 2014-03-15 DIAGNOSIS — I5022 Chronic systolic (congestive) heart failure: Secondary | ICD-10-CM

## 2014-03-15 LAB — BASIC METABOLIC PANEL
BUN: 29 mg/dL — AB (ref 6–23)
CHLORIDE: 107 meq/L (ref 96–112)
CO2: 25 meq/L (ref 19–32)
CREATININE: 1.2 mg/dL — AB (ref 0.50–1.10)
Calcium: 9.2 mg/dL (ref 8.4–10.5)
GFR calc Af Amer: 60 mL/min — ABNORMAL LOW (ref >90)
GFR calc non Af Amer: 52 mL/min — ABNORMAL LOW (ref >90)
Glucose, Bld: 88 mg/dL (ref 70–99)
Potassium: 4.3 mEq/L (ref 3.7–5.3)
Sodium: 140 mEq/L (ref 137–147)

## 2014-03-15 LAB — CBC
HCT: 26.9 % — ABNORMAL LOW (ref 36.0–46.0)
Hemoglobin: 8.8 g/dL — ABNORMAL LOW (ref 12.0–15.0)
MCH: 30.9 pg (ref 26.0–34.0)
MCHC: 32.7 g/dL (ref 30.0–36.0)
MCV: 94.4 fL (ref 78.0–100.0)
Platelets: 265 10*3/uL (ref 150–400)
RBC: 2.85 MIL/uL — ABNORMAL LOW (ref 3.87–5.11)
RDW: 12.4 % (ref 11.5–15.5)
WBC: 7.8 10*3/uL (ref 4.0–10.5)

## 2014-03-15 MED ORDER — DSS 100 MG PO CAPS: 100.0000 mg | ORAL_CAPSULE | Freq: Two times a day (BID) | ORAL | Status: DC

## 2014-03-15 MED ORDER — OMEPRAZOLE 40 MG PO CPDR: 40.0000 mg | DELAYED_RELEASE_CAPSULE | Freq: Every day | ORAL | Status: DC

## 2014-03-15 NOTE — Progress Notes (Signed)
Pt left at this time with her family member (cousin). Pt alert, oriented, and without c/o.  Discharge instructions/prescriptions given/explained with pt verbalizing understanding.  Followup appointments noted.

## 2014-03-15 NOTE — Discharge Summary (Signed)
Physician Discharge Summary  Stacey Lin HQI:[id number] DOB: 08-25-1966 DOA: 03/13/2014  PCP: Holland Commons, NP  Admit date: 03/13/2014 Discharge date: 03/15/2014  Recommendations for Outpatient Follow-up:  1. Follow up with primary care doctor next week for repeat CBC and BMP.  Work up for anemia if not already complete.  Please restart her lisinopril if kidney function is okay.  Her normal BP is on the low normal side. 2. Reinforced taking amoxicillin, tylenol #3 and seeing dentist.    Discharge Diagnoses:  Principal Problem:   AKI (acute kidney injury) Active Problems:   Essential hypertension, benign   COPD   Pain in a tooth or teeth   Chronic combined systolic and diastolic congestive heart failure   Musculoskeletal chest pain   Discharge Condition: stable, improved  Diet recommendation: low sodium  Wt Readings from Last 3 Encounters:  03/14/14 81.8 kg (180 lb 5.4 oz)  03/13/14 79.924 kg (176 lb 3.2 oz)  02/24/14 78.983 kg (174 lb 2 oz)    History of present illness:  48 year old female with a history of hypertension, hyperlipidemia, polysubstance abuse including cocaine and tobacco presents with 1 week history of intermittent left-sided chest pain, dental pain, abdominal pain, and diarrhea. The patient has numerous complaints but her main concerns are as above. The patient states she has been having a nonproductive cough. She states that she is Jodette Wik of breath all the time, but states that this is not worse than usual. She denies any hemoptysis, fevers, chills, vomiting, dysuria, hematuria, hematochezia, melena. The patient went to see a physician at the [facility] on his clinic on the day prior to admission, and was given a prescription for Tylenol No. 3. She stated that this did not help her pain. As a result into the emergency department for further evaluation. She denies any recent cocaine use. She states that she has been clean for 3 years. She continues  to smoke 2 cigarettes per day. She denies any alcohol use or marijuana use.  She denies any recent travels, eating raw or undercooked foods. The patient was prescribed amoxicillin, but has not yet started the medicine.   In the emergency department, CBC was unremarkable with WBC 9.9, hemoglobin 9.5. Hepatic panel was unremarkable. BMP showed serum creatinine 1.77. Urinalysis was negative for pyuria. ProBNP was 130. Her pregnancy test was negative. Lipase was 54. Chest x-ray was negative for any acute findings  Hospital Course:  Atypical chest pain, possibly musculoskeletal versus gastric in nature. Because the episodes only last for a split seconds and resolve and they are brought on and made better by moving, I feel that these are most likely due to musculoskeletal pain. She is advised to use warm compresses and Tylenol as needed for her pain. She should talk to her primary care Doctor about alternative pain medications if this pain persists. She is also advised to try using a PPI. She was monitored on telemetry which demonstrated 1st degree block without pauses or bradycardia.  Her EKG demonstrated nonspecific T wave changes, sinus rhythm. On her previous EKGs from November of 2014, she had deep T wave inversions in the lateral leads. These have since resolved. Her troponins were negative and her urine drug screen was negative. She is followed closely by cardiology. He does not appear that she has had a stress test per epic notes, I will defer the decision for ischemia testing to cardiology.  Acute kidney injury, likely volume depletion in the setting of Lasix, Aldactone, and lisinopril use. Her  diuretics and ACE inhibitor were held. She was given IV fluids. Her renal ultrasound was negative. Her creatinine trended down. She is advised to resume her Lasix and Aldactone on 5/25. She should have repeat BMP done by her primary care doctor early next week to ensure that her kidney function has returned to  normal. She should discontinue her lisinopril until her primary care doctor tells her it is safe to restart. She is advised to call the cardiology clinic on Monday.  Dental pain with dental caries. Dental x-rays did not demonstrate any apical abscesses. She was started on Augmentin and advised to followup with dentistry.  Intermittent diarrhea, since resolved. Her last bowel movement was several days prior to admission. Her abdominal x-ray demonstrated possible constipation at this time.  Abdominal pain, liver function tests, lipase, urinalysis all unremarkable. Her x-ray demonstrated constipation. There were no abnormalities that would have contributed to abdominal pain seen on her abdominal ultrasound. She was started on Colace and senna.  Polysubstance abuse, cessation discussed. Her urine drug screen was negative.  Chronic systolic and diastolic heart failure, ejection fraction of 45% with grade 1 diastolic dysfunction. She appeared hypovolemic, and her diuretics were held and she was given some IV fluids.  She may resume her diuretics tomorrow.  She should continue her carvedilol, but hold her ACEI until repeat creatinine next week shows her kidney function is stable.    Hypertension, BP low normal despite holding her diuretics, coreg, and lisinopril.  She appears to be near her baseline.   She is advised to stop her lisinopril until advised to restart by her primary care doctor due to AKI, but she may continue her carvedilol and other diuretics.    Normocytic anemia, likely of chronic disease.  Work up per primary care doctor if not already complete.  Last iron studies, b12, folate about 6 months ago were all normal.    Consultants:  None Procedures:  CXR  RUS   Panorex  Antibiotics:  augmentin 5/22 >> 5/23    Discharge Exam: Filed Vitals:   03/15/14 0428  BP: 102/60  Pulse: 53  Temp: 97.9 F (36.6 C)  Resp: 16   Filed Vitals:   03/14/14 0523 03/14/14 1500 03/14/14 2054  03/15/14 0428  BP: 100/59 101/60 114/47 102/60  Pulse: 58 62 64 53  Temp: 97.6 F (36.4 C) 97.7 F (36.5 C) 97.3 F (36.3 C) 97.9 F (36.6 C)  TempSrc: Oral Oral Oral Oral  Resp: 22 18 18 16   Height: 5' (1.524 m)     Weight: 81.8 kg (180 lb 5.4 oz)     SpO2: 100% 100% 100% 100%    General: AAF, No acute distress HEENT: NCAT, MMM  Cardiovascular: RRR, nl S1, S2, 2/6 systolic murmur LSB, 2+ pulses, warm extremities  Respiratory: CTAB, no rales, no increased WOB  Abdomen: NABS, soft, NT/ND  MSK: Normal tone and bulk, no LEE. TTP with palpation of the left ribs.  Neuro: Grossly intact   Discharge Instructions      Discharge Instructions   (HEART FAILURE PATIENTS) Call MD:  Anytime you have any of the following symptoms: 1) 3 pound weight gain in 24 hours or 5 pounds in 1 week 2) shortness of breath, with or without a dry hacking cough 3) swelling in the hands, feet or stomach 4) if you have to sleep on extra pillows at night in order to breathe.    Complete by:  As directed      Call MD  for:  difficulty breathing, headache or visual disturbances    Complete by:  As directed      Call MD for:  extreme fatigue    Complete by:  As directed      Call MD for:  hives    Complete by:  As directed      Call MD for:  persistant dizziness or light-headedness    Complete by:  As directed      Call MD for:  persistant nausea and vomiting    Complete by:  As directed      Call MD for:  severe uncontrolled pain    Complete by:  As directed      Call MD for:  temperature >100.4    Complete by:  As directed      Diet - low sodium heart healthy    Complete by:  As directed      Discharge instructions    Complete by:  As directed   You were hospitalized with tooth pain, chest pain and dehydration.  You do not have an abscess under any of your teeth.  Please take your amoxicillin and tylenol #3 as prescribed by your primary care doctor.  Your chest pain is atypical, but may be due to an  irritated muscle or nerve under the left chest or due to acid reflux.  Try using a heating pad on the area and start taking an acid reflux medication called omeprazole to see if these things help.  Talk to your cardiologist about your pain to see if they feel any further testing of your heart is necessary.  Please do NOT take your lisinopril until your doctor tells you it is safe to restart.  Try using milk of magnesia or magnesium citrate for your constipation if colace is not working.     Increase activity slowly    Complete by:  As directed             Medication List    STOP taking these medications       lisinopril 5 MG tablet  Commonly known as:  PRINIVIL,ZESTRIL      TAKE these medications       acetaminophen-codeine 300-30 MG per tablet  Commonly known as:  TYLENOL #3  Take 1 tablet by mouth every 8 (eight) hours as needed for moderate pain.     albuterol 108 (90 BASE) MCG/ACT inhaler  Commonly known as:  VENTOLIN HFA  Inhale 2 puffs into the lungs 4 (four) times daily as needed for wheezing.     amoxicillin 500 MG capsule  Commonly known as:  AMOXIL  Take 1 capsule (500 mg total) by mouth 3 (three) times daily.     atorvastatin 40 MG tablet  Commonly known as:  LIPITOR  Take 1 tablet (40 mg total) by mouth daily at 6 PM.     carvedilol 6.25 MG tablet  Commonly known as:  COREG  - Take 9.375-12.5 mg by mouth 2 (two) times daily with a meal. Take 1.5 tablets in the morning  - Take 2 tablets in the evening     DSS 100 MG Caps  Take 100 mg by mouth 2 (two) times daily.     furosemide 40 MG tablet  Commonly known as:  LASIX  Take 2 tablets (80 mg total) by mouth 2 (two) times daily.     omeprazole 40 MG capsule  Commonly known as:  PRILOSEC  Take 1 capsule (40 mg total) by  mouth daily.     spironolactone 25 MG tablet  Commonly known as:  ALDACTONE  Take 1 tablet (25 mg total) by mouth every evening.       Follow-up Information   Follow up with Holland Commons, NP. Schedule an appointment as soon as possible for a visit in 1 week.   Specialty:  Internal Medicine   Contact information:   [address] [place] [address] [phone]        The results of significant diagnostics from this hospitalization (including imaging, microbiology, ancillary and laboratory) are listed below for reference.    Significant Diagnostic Studies: Dg Orthopantogram  03/14/2014   CLINICAL DATA:  Diffuse mouth/ tooth pain.  EXAM: ORTHOPANTOGRAM/PANORAMIC  COMPARISON:  None.  FINDINGS: Numerous teeth are absent. There are residual molar tooth fragments involving the right maxilla and left mandible. Midline evaluation is limited by artifact typical of the Panorex technique. Scattered dental caries are suspected. No significant periodontal disease is seen.  IMPRESSION: Multiple absent teeth with tooth fragments and dental caries. No significant periodontal disease identified -midline evaluation is limited by this examination.   Electronically Signed   By: Roxy Horseman M.D.   On: 03/14/2014 16:45   Dg Chest 2 View  03/14/2014   CLINICAL DATA:  Chest pain.  Shortness of breath.  Dizziness.  EXAM: CHEST  2 VIEW  COMPARISON:  Chest x-ray 08/21/2013.  FINDINGS: Lung volumes are normal. No consolidative airspace disease. No pleural effusions. No evidence of pulmonary edema. No pneumothorax. No suspicious appearing pulmonary nodules or masses. Heart size is mildly enlarged, but improved compared to the prior study. Upper mediastinal contours are within normal limits. Atherosclerosis in the thoracic aorta.  IMPRESSION: 1. No radiographic evidence of acute cardiopulmonary disease. 2. Mild cardiomegaly, improved compared to the prior study. 3. Atherosclerosis.   Electronically Signed   By: Trudie Reed M.D.   On: 03/14/2014 00:02   US Renal  03/14/2014   CLINICAL DATA:  Acute renal failure  EXAM: RENAL/URINARY TRACT ULTRASOUND COMPLETE  COMPARISON:  CT ABD/PELVIS W CM  dated 08/21/2013  FINDINGS: Right Kidney:  Length: 9.5 cm. Echogenicity within normal limits. No mass or hydronephrosis visualized.  Left Kidney:  Length: 9.1 cm. Echogenicity within normal limits. No mass or hydronephrosis visualized. Lower renal pole suboptimally visualized due to overlying bowel gas. Otherwise unremarkable.  Bladder:  Appears normal for degree of bladder distention.  IMPRESSION: No acute abnormality.   Electronically Signed   By: Christiana Pellant M.D.   On: 03/14/2014 16:25   Dg Abd 2 Views  03/14/2014   CLINICAL DATA:  Abdominal pain and nausea for 2 months  EXAM: ABDOMEN - 2 VIEW  COMPARISON:  CT ABD/PELVIS W CM dated 08/21/2013  FINDINGS: There is a moderate stool burden within the colon. There is no small or large bowel obstructive pattern. No abnormal soft tissue calcifications. Phleboliths present in the pelvis. The observed bony structures are normal.  IMPRESSION: Moderate colonic stool burden could be related to clinical constipation. There is no bowel obstruction.   Electronically Signed   By: David  [place]   On: 03/14/2014 16:13    Microbiology: No results found for this or any previous visit (from the past 240 hour(s)).   Labs: Basic Metabolic Panel:  Recent Labs Lab 03/13/14 2317 03/15/14 0356  NA 140 140  K 3.6* 4.3  CL 99 107  CO2 26 25  GLUCOSE 133* 88  BUN 48* 29*  CREATININE 1.77* 1.20*  CALCIUM 9.3 9.2   Liver Function Tests:  Recent Labs Lab 03/13/14 2317  AST 17  ALT 14  ALKPHOS 135*  BILITOT <0.2*  PROT 7.3  ALBUMIN 3.7    Recent Labs Lab 03/13/14 2317  LIPASE 54   No results found for this basename: AMMONIA,  in the last 168 hours CBC:  Recent Labs Lab 03/13/14 1247 03/13/14 2317 03/15/14 0356  WBC 9.4 9.9 7.8  HGB 10.0* 9.5* 8.8*  HCT 29.3* 28.9* 26.9*  MCV 90.7 92.9 94.4  PLT 375 340 265   Cardiac Enzymes:  Recent Labs Lab 03/14/14 0639 03/14/14 1145 03/14/14 1732  CKTOTAL 389*  --   --   TROPONINI <0.30 <0.30  <0.30   BNP: BNP (last 3 results)  Recent Labs  08/19/13 1754 03/13/14 2317  PROBNP 6945.0* 130.6*   CBG: No results found for this basename: GLUCAP,  in the last 168 hours  Time coordinating discharge: 35 minutes  Signed:  Renae Fickle  [facility] 03/15/2014, 11:34 AM

## 2014-03-18 ENCOUNTER — Telehealth: Payer: Self-pay | Admitting: Emergency Medicine

## 2014-03-18 DIAGNOSIS — D649 Anemia, unspecified: Secondary | ICD-10-CM

## 2014-03-18 NOTE — Telephone Encounter (Signed)
Spoke with pt in regards to low Hemoglobin. Pt denies heavy bleeding or blood in stools. Referral placed to GI for colonoscopy screening Also instructed pt to start taking OTC iron supplements for possible iron deficiency

## 2014-03-18 NOTE — Telephone Encounter (Signed)
Message copied by Ricci Barker on Tue Mar 18, 2014 10:37 AM ------      Message from: Chari Manning A      Created: Mon Mar 17, 2014 12:58 PM       Let patient know her Hemoglobin is low and find out if she is having heavy cycles or blood in stool. Find out if she has ever had colonoscopy, if not please schedule her one. Thanks. ------

## 2014-03-19 ENCOUNTER — Ambulatory Visit: Payer: Medicaid Other | Admitting: Pulmonary Disease

## 2014-03-19 ENCOUNTER — Ambulatory Visit: Payer: Medicaid Other | Attending: Internal Medicine | Admitting: *Deleted

## 2014-03-19 ENCOUNTER — Ambulatory Visit: Payer: Self-pay | Attending: Internal Medicine | Admitting: *Deleted

## 2014-03-19 ENCOUNTER — Encounter: Payer: Self-pay | Admitting: Pulmonary Disease

## 2014-03-19 VITALS — BP 90/50 | HR 75 | Temp 97.8°F | Ht 60.0 in | Wt 177.4 lb

## 2014-03-19 DIAGNOSIS — F411 Generalized anxiety disorder: Secondary | ICD-10-CM

## 2014-03-19 DIAGNOSIS — J449 Chronic obstructive pulmonary disease, unspecified: Secondary | ICD-10-CM

## 2014-03-19 DIAGNOSIS — F331 Major depressive disorder, recurrent, moderate: Secondary | ICD-10-CM

## 2014-03-19 DIAGNOSIS — R0600 Dyspnea, unspecified: Secondary | ICD-10-CM

## 2014-03-19 NOTE — Progress Notes (Unsigned)
Patient here today for medication review. Patient states she was informed by Sharee Pimple, RN to stop taking her Lisinopril per PCP because of her low blood pressure. Patient states she has been taking the Lisinopril for a couple days. Blood Pressure checked. Patient concerned about constipation while taking iron supplements. Informed patient to drink plenty of water and eat foods high in fiber. Alverda Skeans, RN

## 2014-03-19 NOTE — Progress Notes (Signed)
Patient presented and stated that she has had challenges with managing her mood and emotions for the past few months.  LCSW assessed her using the GAD7 (19) and PHQ9 (18). LCSW referred patient to [facility] for counseling and psychiatry in order to support her abstinence from substance abuse.    Christene Lye MSW, LCSW Duration 60 minutes

## 2014-03-19 NOTE — Patient Instructions (Signed)
Will start on breo one inhalation each am.  Rinse mouth well. Can use your albuterol inhaler if needed for rescue. Work on weight loss and conditioning.  Will see you back after your sleep study to discuss results and also to check on your breathing.

## 2014-03-19 NOTE — Patient Instructions (Signed)
Fiber Content in Foods  Drinking plenty of fluids and consuming foods high in fiber can help with constipation. See the list below for the fiber content of some common foods.  Starches and Grains / Dietary Fiber (g)  · Cheerios, 1 cup / 3 g  · Kellogg's Corn Flakes, 1 cup / 0.7 g  · Rice Krispies, 1 ¼ cup / 0.3 g  · Quaker Oat Life Cereal, ¾ cup / 2.1 g  · Oatmeal, instant (cooked), ½ cup / 2 g  · Kellogg's Frosted Mini Wheats, 1 cup / 5.1 g  · Rice, brown, long-grain (cooked), 1 cup / 3.5 g  · Rice, white, long-grain (cooked), 1 cup / 0.6 g  · Macaroni, cooked, enriched, 1 cup / 2.5 g  Legumes / Dietary Fiber (g)  · Beans, baked, canned, plain or vegetarian, ½ cup / 5.2 g  · Beans, kidney, canned, ½ cup / 6.8 g  · Beans, pinto, dried (cooked), ½ cup / 7.7 g  · Beans, pinto, canned, ½ cup / 5.5 g  Breads and Crackers / Dietary Fiber (g)  · Graham crackers, plain or honey, 2 squares / 0.7 g  · Saltine crackers, 3 squares / 0.3 g  · Pretzels, plain, salted, 10 pieces / 1.8 g  · Bread, whole-wheat, 1 slice / 1.9 g  · Bread, white, 1 slice / 0.7 g  · Bread, raisin, 1 slice / 1.2 g  · Bagel, plain, 3 oz / 2 g  · Tortilla, flour, 1 oz / 0.9 g  · Tortilla, corn, 1 small / 1.5 g  · Bun, hamburger or hotdog, 1 small / 0.9 g  Fruits / Dietary Fiber (g)  · Apple, raw with skin, 1 medium / 4.4 g  · Applesauce, sweetened, ½ cup / 1.5 g  · Banana, ½ medium / 1.5 g  · Grapes, 10 grapes / 0.4 g  · Orange, 1 small / 2.3 g  · Raisin, 1.5 oz / 1.6 g  · Melon, 1 cup / 1.4 g  Vegetables / Dietary Fiber (g)  · Green beans, canned, ½ cup / 1.3 g  · Carrots (cooked), ½ cup / 2.3 g  · Broccoli (cooked), ½ cup / 2.8 g  · Peas, frozen (cooked), ½ cup / 4.4 g  · Potatoes, mashed, ½ cup / 1.6 g  · Lettuce, 1 cup / 0.5 g  · Corn, canned, ½ cup / 1.6 g  · Tomato, ½ cup / 1.1 g  Document Released: 02/26/2007 Document Revised: 01/02/2012 Document Reviewed: 04/23/2007  [employer]® Patient Information ©2014 [employer].

## 2014-03-19 NOTE — Assessment & Plan Note (Signed)
The patient has mild airflow obstruction by pulmonary function studies in January of this year, but had a normal FEV1 percent as part of her CPS T. this month. This indicates that she has reversible airflow obstruction, and most likely related to ongoing airway inflammation from her tobacco use. I certainly cannot exclude the possibility of underlying asthma. I would like to start her on a LABA/ICS, but I suspect that she will not need any maintenance bronchodilator if she is able to stop smoking. She has very mild airflow limitation, and therefore I think this is only a small part of her dyspnea on exertion.

## 2014-03-19 NOTE — Assessment & Plan Note (Signed)
The patient has multifactorial dyspnea secondary to mild reversible airflow obstruction, obesity and deconditioning, as well as chronic congestive heart failure. In order for her to improve, we're going to have to work on each one of these issues. I have stressed to her that her part is to work on some type of exercise program, and also to work on aggressive weight loss.

## 2014-03-19 NOTE — Progress Notes (Signed)
Subjective:    Patient ID: Stacey Lin, female    DOB: December 18, 1965, 48 y.o.   MRN: [id number]  HPI The patient is a 48 year old female who I've been asked to see for dyspnea on exertion. She has known history of chronic congestive heart failure and valvular heart disease, but also has a long history of smoking. She had full PFTs in January of this year that showed mild airflow limitation, moderate restriction probably related to her centripetal obesity, and was unable to tolerate the diffusion maneuver. She is trying to quit smoking, and is down to a few cigarettes a day. She tells me that she has had dyspnea on exertion for at least a year, and that it has been progressive in nature. She describes a less than one block dyspnea on exertion at a moderate pace, and we'll get winded walking up a flight of stairs or bringing groceries in from the car. She also states that many times her legs will get tired before her shortness of breath becomes significant. She denies any significant cough or mucus production, and admits to having intermittent lower extremity edema. She has had a recent chest x-ray that was unremarkable except for cardiomegaly, and a recent CPS T. shows spirometry with a normal FEV1 percent.   Review of Systems  Constitutional: Negative for fever and unexpected weight change.  HENT: Negative for congestion, dental problem, ear pain, nosebleeds, postnasal drip, rhinorrhea, sinus pressure, sneezing, sore throat and trouble swallowing.   Eyes: Negative for redness and itching.  Respiratory: Positive for cough, chest tightness, shortness of breath and wheezing.   Cardiovascular: Negative for palpitations and leg swelling.  Gastrointestinal: Negative for nausea and vomiting.  Genitourinary: Negative for dysuria.  Musculoskeletal: Negative for joint swelling.  Skin: Negative for rash.  Neurological: Negative for headaches.  Hematological: Does not bruise/bleed easily.    Psychiatric/Behavioral: Negative for dysphoric mood. The patient is not nervous/anxious.        Objective:   Physical Exam Constitutional:  Overweight female, no acute distress  HENT:  Nares patent without discharge  Oropharynx without exudate, palate and uvula are normal  Eyes:  Perrla, eomi, no scleral icterus  Neck:  No JVD, no TMG  Cardiovascular:  Normal rate, regular rhythm, no rubs or gallops.  2/6 sem        Intact distal pulses  Pulmonary :  Normal breath sounds, no stridor or respiratory distress   No rales, rhonchi, or wheezing  Abdominal:  Soft, nondistended, bowel sounds present.  No tenderness noted.   Musculoskeletal:  No lower extremity edema noted.  Lymph Nodes:  No cervical lymphadenopathy noted  Skin:  No cyanosis noted  Neurologic:  Alert, appropriate, moves all 4 extremities without obvious deficit.         Assessment & Lin:

## 2014-03-31 ENCOUNTER — Encounter: Payer: Self-pay | Admitting: Internal Medicine

## 2014-03-31 ENCOUNTER — Ambulatory Visit: Payer: Medicaid Other | Attending: Internal Medicine | Admitting: Internal Medicine

## 2014-03-31 VITALS — BP 102/68 | HR 73 | Temp 98.5°F | Resp 14 | Ht 60.0 in | Wt 175.6 lb

## 2014-03-31 DIAGNOSIS — I10 Essential (primary) hypertension: Secondary | ICD-10-CM | POA: Diagnosis present

## 2014-03-31 DIAGNOSIS — F17200 Nicotine dependence, unspecified, uncomplicated: Secondary | ICD-10-CM | POA: Insufficient documentation

## 2014-03-31 DIAGNOSIS — D649 Anemia, unspecified: Secondary | ICD-10-CM

## 2014-03-31 DIAGNOSIS — J45909 Unspecified asthma, uncomplicated: Secondary | ICD-10-CM | POA: Insufficient documentation

## 2014-03-31 DIAGNOSIS — Z79899 Other long term (current) drug therapy: Secondary | ICD-10-CM | POA: Diagnosis not present

## 2014-03-31 DIAGNOSIS — I5022 Chronic systolic (congestive) heart failure: Secondary | ICD-10-CM | POA: Insufficient documentation

## 2014-03-31 DIAGNOSIS — N179 Acute kidney failure, unspecified: Secondary | ICD-10-CM

## 2014-03-31 DIAGNOSIS — Z791 Long term (current) use of non-steroidal anti-inflammatories (NSAID): Secondary | ICD-10-CM | POA: Diagnosis not present

## 2014-03-31 DIAGNOSIS — I509 Heart failure, unspecified: Secondary | ICD-10-CM | POA: Diagnosis not present

## 2014-03-31 LAB — CBC
HCT: 32.6 % — ABNORMAL LOW (ref 36.0–46.0)
HEMOGLOBIN: 10.9 g/dL — AB (ref 12.0–15.0)
MCH: 30.5 pg (ref 26.0–34.0)
MCHC: 33.4 g/dL (ref 30.0–36.0)
MCV: 91.3 fL (ref 78.0–100.0)
PLATELETS: 366 10*3/uL (ref 150–400)
RBC: 3.57 MIL/uL — ABNORMAL LOW (ref 3.87–5.11)
RDW: 13.2 % (ref 11.5–15.5)
WBC: 8.5 10*3/uL (ref 4.0–10.5)

## 2014-03-31 LAB — BASIC METABOLIC PANEL WITH GFR
BUN: 19 mg/dL (ref 6–23)
CO2: 30 mEq/L (ref 19–32)
Calcium: 9.6 mg/dL (ref 8.4–10.5)
Chloride: 99 mEq/L (ref 96–112)
Creat: 1.19 mg/dL — ABNORMAL HIGH (ref 0.50–1.10)
GFR, Est African American: 62 mL/min
GFR, Est Non African American: 54 mL/min — ABNORMAL LOW
GLUCOSE: 100 mg/dL — AB (ref 70–99)
Potassium: 3.8 mEq/L (ref 3.5–5.3)
Sodium: 136 mEq/L (ref 135–145)

## 2014-03-31 NOTE — Progress Notes (Signed)
HFU for anemia, anxiety and hypotension. States she has not started antibiotic.

## 2014-03-31 NOTE — Progress Notes (Signed)
Patient ID: Stacey Lin, female   DOB: 04-Nov-1965, 48 y.o.   MRN: [id number]  CC: HFU  HPI: Patient reports today for a follow up after being admitted to the hospital for chest pain.  Patient had negative serial troponin's and pain was determined to be musculoskeletal/anxiety related.  Patient has since then stopped taking Lisinopril d/t stop systolic pressures and elevated creatinine while hospitalized.  Patient is still taking diuretics and Coreg for heart failure.  Patient reports that she feels much better. She feels that she suffers from depression and anxiety, and [facility]. She has a return appointment on June 19th for medication management.  Patient has history of fibroids, and is still having menstrual cycles.    No Known Allergies Past Medical History  Diagnosis Date  . Hypertension   . Anemia   . Arrhythmia   . Hypercholesteremia   . Chronic headaches   . Asthma    Current Outpatient Prescriptions on File Prior to Visit  Medication Sig Dispense Refill  . albuterol (VENTOLIN HFA) 108 (90 BASE) MCG/ACT inhaler Inhale 2 puffs into the lungs 4 (four) times daily as needed for wheezing.  1 Inhaler  11  . atorvastatin (LIPITOR) 40 MG tablet Take 1 tablet (40 mg total) by mouth daily at 6 PM.  30 tablet  11  . carvedilol (COREG) 6.25 MG tablet Take 9.375-12.5 mg by mouth 2 (two) times daily with a meal. Take 1.5 tablets in the morning Take 2 tablets in the evening      . furosemide (LASIX) 40 MG tablet Take 2 tablets (80 mg total) by mouth 2 (two) times daily.  120 tablet  11  . omeprazole (PRILOSEC) 40 MG capsule Take 1 capsule (40 mg total) by mouth daily.  30 capsule  0  . spironolactone (ALDACTONE) 25 MG tablet Take 1 tablet (25 mg total) by mouth every evening.  30 tablet  7  . acetaminophen-codeine (TYLENOL #3) 300-30 MG per tablet Take 1 tablet by mouth every 8 (eight) hours as needed for moderate pain.  10 tablet   0  . amoxicillin (AMOXIL) 500 MG capsule Take 1 capsule (500 mg total) by mouth 3 (three) times daily.  30 capsule  0  . docusate sodium 100 MG CAPS Take 100 mg by mouth 2 (two) times daily.  10 capsule  0   No current facility-administered medications on file prior to visit.   Family History  Problem Relation Age of Onset  . Hypertension Mother   . Hypertension Father   . Hypertension Maternal Grandmother   . Allergies Mother   . Asthma Mother   . Heart disease Mother    History   Social History  . Marital Status: Legally Separated    Spouse Name: N/A    Number of Children: N/A  . Years of Education: N/A   Occupational History  . Not on file.   Social History Main Topics  . Smoking status: Current Some Day Smoker -- 0.10 packs/day for 20 years    Types: Cigarettes  . Smokeless tobacco: Never Used     Comment: Smokes occasionally when someone has a cig to have, doesn't buy them.  . Alcohol Use: No  . Drug Use: No     Comment: former, stopped smoking crack X3 years ago  . Sexual Activity: Not on file   Other Topics Concern  . Not on file   Social History Narrative  .  No narrative on file   Review of Systems  Eyes: Negative for blurred vision and double vision.  Respiratory: Negative.   Cardiovascular: Negative for chest pain (resolved).  Gastrointestinal: Negative.   Neurological: Negative.  Negative for headaches.     Objective:   Filed Vitals:   03/31/14 0932  BP: 102/68  Pulse: 73  Temp: 98.5 F (36.9 C)  Resp: 14   Physical Exam  Vitals reviewed. Constitutional: She is oriented to person, place, and time. She appears well-nourished.  Cardiovascular: Normal rate, regular rhythm and normal heart sounds.   Pulmonary/Chest: Effort normal and breath sounds normal.  Abdominal: Soft. Bowel sounds are normal.  Musculoskeletal: Normal range of motion. She exhibits no edema and no tenderness.  Neurological: She is alert and oriented to person, place, and  time.     Lab Results  Component Value Date   WBC 7.8 03/15/2014   HGB 8.8* 03/15/2014   HCT 26.9* 03/15/2014   MCV 94.4 03/15/2014   PLT 265 03/15/2014   Lab Results  Component Value Date   CREATININE 1.20* 03/15/2014   BUN 29* 03/15/2014   NA 140 03/15/2014   K 4.3 03/15/2014   CL 107 03/15/2014   CO2 25 03/15/2014    Lab Results  Component Value Date   HGBA1C 5.7* 08/20/2013   Lipid Panel     Component Value Date/Time   CHOL 143 08/20/2013 0505   TRIG 97 08/20/2013 0505   HDL 40 08/20/2013 0505   CHOLHDL 3.6 08/20/2013 0505   VLDL 19 08/20/2013 0505   LDLCALC 84 08/20/2013 0505       Assessment and plan:   Ruthan was seen today for hospitalization follow-up, hypotension and anxiety.  Diagnoses and associated orders for this visit:  Anemia - CBC  Medication management - BASIC METABOLIC PANEL WITH GFR  Chronic systolic CHF (congestive heart failure)  Essential hypertension, benign D/c Lisinopril. Repeated BMP to assess kidney function. Patient advised to try OTC Melatonin for sleep.  AKI (acute kidney injury)  Return in about 3 months (around 07/01/2014).  Ambrose Finland, NP-C [facility] [phone] 04/01/2014, 9:43 AM

## 2014-03-31 NOTE — Patient Instructions (Signed)
Try OTC Melatonin for sleep Smoking Cessation Quitting smoking is important to your health and has many advantages. However, it is not always easy to quit since nicotine is a very addictive drug. Often times, people try 3 times or more before being able to quit. This document explains the best ways for you to prepare to quit smoking. Quitting takes hard work and a lot of effort, but you can do it. ADVANTAGES OF QUITTING SMOKING  You will live longer, feel better, and live better.  Your body will feel the impact of quitting smoking almost immediately.  Within 20 minutes, blood pressure decreases. Your pulse returns to its normal level.  After 8 hours, carbon monoxide levels in the blood return to normal. Your oxygen level increases.  After 24 hours, the chance of having a heart attack starts to decrease. Your breath, hair, and body stop smelling like smoke.  After 48 hours, damaged nerve endings begin to recover. Your sense of taste and smell improve.  After 72 hours, the body is virtually free of nicotine. Your bronchial tubes relax and breathing becomes easier.  After 2 to 12 weeks, lungs can hold more air. Exercise becomes easier and circulation improves.  The risk of having a heart attack, stroke, cancer, or lung disease is greatly reduced.  After 1 year, the risk of coronary heart disease is cut in half.  After 5 years, the risk of stroke falls to the same as a nonsmoker.  After 10 years, the risk of lung cancer is cut in half and the risk of other cancers decreases significantly.  After 15 years, the risk of coronary heart disease drops, usually to the level of a nonsmoker.  If you are pregnant, quitting smoking will improve your chances of having a healthy baby.  The people you live with, especially any children, will be healthier.  You will have extra money to spend on things other than cigarettes. QUESTIONS TO THINK ABOUT BEFORE ATTEMPTING TO QUIT You may want to talk  about your answers with your caregiver.  Why do you want to quit?  If you tried to quit in the past, what helped and what did not?  What will be the most difficult situations for you after you quit? How will you plan to handle them?  Who can help you through the tough times? Your family? Friends? A caregiver?  What pleasures do you get from smoking? What ways can you still get pleasure if you quit? Here are some questions to ask your caregiver:  How can you help me to be successful at quitting?  What medicine do you think would be best for me and how should I take it?  What should I do if I need more help?  What is smoking withdrawal like? How can I get information on withdrawal? GET READY  Set a quit date.  Change your environment by getting rid of all cigarettes, ashtrays, matches, and lighters in your home, car, or work. Do not let people smoke in your home.  Review your past attempts to quit. Think about what worked and what did not. GET SUPPORT AND ENCOURAGEMENT You have a better chance of being successful if you have help. You can get support in many ways.  Tell your family, friends, and co-workers that you are going to quit and need their support. Ask them not to smoke around you.  Get individual, group, or telephone counseling and support. Programs are available at [employer] and health centers. Call  your local health department for information about programs in your area.  Spiritual beliefs and practices may help some smokers quit.  Download a "quit meter" on your computer to keep track of quit statistics, such as how long you have gone without smoking, cigarettes not smoked, and money saved.  Get a self-help book about quitting smoking and staying off of tobacco. Jet yourself from urges to smoke. Talk to someone, go for a walk, or occupy your time with a task.  Change your normal routine. Take a different route to work.  Drink tea instead of coffee. Eat breakfast in a different place.  Reduce your stress. Take a hot bath, exercise, or read a book.  Plan something enjoyable to do every day. Reward yourself for not smoking.  Explore interactive web-based programs that specialize in helping you quit. GET MEDICINE AND USE IT CORRECTLY Medicines can help you stop smoking and decrease the urge to smoke. Combining medicine with the above behavioral methods and support can greatly increase your chances of successfully quitting smoking.  Nicotine replacement therapy helps deliver nicotine to your body without the negative effects and risks of smoking. Nicotine replacement therapy includes nicotine gum, lozenges, inhalers, nasal sprays, and skin patches. Some may be available over-the-counter and others require a prescription.  Antidepressant medicine helps people abstain from smoking, but how this works is unknown. This medicine is available by prescription.  Nicotinic receptor partial agonist medicine simulates the effect of nicotine in your brain. This medicine is available by prescription. Ask your caregiver for advice about which medicines to use and how to use them based on your health history. Your caregiver will tell you what side effects to look out for if you choose to be on a medicine or therapy. Carefully read the information on the package. Do not use any other product containing nicotine while using a nicotine replacement product.  RELAPSE OR DIFFICULT SITUATIONS Most relapses occur within the first 3 months after quitting. Do not be discouraged if you start smoking again. Remember, most people try several times before finally quitting. You may have symptoms of withdrawal because your body is used to nicotine. You may crave cigarettes, be irritable, feel very hungry, cough often, get headaches, or have difficulty concentrating. The withdrawal symptoms are only temporary. They are strongest when you first quit,  but they will go away within 10 14 days. To reduce the chances of relapse, try to:  Avoid drinking alcohol. Drinking lowers your chances of successfully quitting.  Reduce the amount of caffeine you consume. Once you quit smoking, the amount of caffeine in your body increases and can give you symptoms, such as a rapid heartbeat, sweating, and anxiety.  Avoid smokers because they can make you want to smoke.  Do not let weight gain distract you. Many smokers will gain weight when they quit, usually less than 10 pounds. Eat a healthy diet and stay active. You can always lose the weight gained after you quit.  Find ways to improve your mood other than smoking. FOR MORE INFORMATION  www.smokefree.gov  Document Released: 10/04/2001 Document Revised: 04/10/2012 Document Reviewed: 01/19/2012 [facility] Patient Information 2014 [place], [place].

## 2014-04-01 ENCOUNTER — Telehealth: Payer: Self-pay | Admitting: Emergency Medicine

## 2014-04-01 NOTE — Telephone Encounter (Signed)
Pt instructed not to take Lisinopril due to continuous kidney elevation. Pt expressed what is the next step to improve function.

## 2014-04-01 NOTE — Telephone Encounter (Signed)
Let patient know to avoid medications that may affect her kidneys such as NSAIDS and the lisinopril. Mainatin good BP control. Nothing to improve kidney function at this time, just will continue to monitor.

## 2014-04-01 NOTE — Telephone Encounter (Signed)
Message copied by Ricci Barker on Tue Apr 01, 2014 10:25 AM ------      Message from: Chari Manning A      Created: Tue Apr 01, 2014  9:10 AM       Let patient know her kidney function is still elevated and to be sure to NOT take the lisinopril. Thanks ------

## 2014-04-02 NOTE — Progress Notes (Signed)
Patient ID: Stacey Lin, female   DOB: Mar 15, 1966, 48 y.o.   MRN: [id number]  PCP: [facility] Pulmonologist: Dr. Shelle Iron  HPI: Stacey Lin is a 48 yo female with a history of anemia, combined systolic/diastolic heart failure, HTN, and history of polysubstance abuse. Previous ECHO (04/2010) EF 50-55% with grd 1 DD, LVIDd 47mm, mild to mod MR, RA and LA mildly dilated.   Admitted to the hospital 08/19/13-08/29/13 with A/C systolic HF. Discharge weight 144 lbs.  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 Mod MR and Mod-sev TR Echo (1/15): EF 45%, mild to moderate AI, moderate MR, RV normal size with mildly decreased systolic function.   Follow up for Heart Failure: Since last visit was admitted for CP. Troponins were negative and she had AKI and lisinopril stopped. She was also seen by pulmonary and was started on LABA/ICS. Just came from sleep study. Feeling good. Denies SOB, CP or LE edema. +orthopnea. Fatigued. Can walk about 1/2 block. Medicaid pending. Trying to follow a low salt diet and drinking less than 2L a day. Weight at home 172 lbs. No dizziness.   Labs (1/15): K 4.1, creatinine 0.96, hemoglobin 10.1 Labs (12/04/13) K 4.3 Creatinine 0.97  Labs (4/15) K+4.1, Creatinine 0.88          (6/15) Hgb 10.9, K 3.8, creatinine 1.19  SH: Living with her son in [place]. No ETOH or drug use. Filling for disability. Tobacco abuse  FH: Stacey Lin deceased: Dementia, seizures, stomach cancer        Stacey Lin deceased: HTN  ROS: All systems negative except as listed in HPI, PMH and Problem List.  Past Medical History  Diagnosis Date  . Hypertension   . Anemia   . Arrhythmia   . Hypercholesteremia   . Chronic headaches   . Asthma     Current Outpatient Prescriptions  Medication  Sig Dispense Refill  . atorvastatin (LIPITOR) 40 MG tablet Take 1 tablet (40 mg total) by mouth daily at 6 PM.  30 tablet  11  . carvedilol (COREG) 6.25 MG tablet Take 9.375-12.5 mg by mouth 2 (two) times daily with a meal. Take 1.5 tablets in the morning Take 2 tablets in the evening      . ferrous sulfate 325 (65 FE) MG tablet Take 325 mg by mouth daily with breakfast.      . Fluticasone Furoate-Vilanterol (BREO ELLIPTA) 100-25 MCG/INH AEPB Inhale 1 spray into the lungs 3 (three) times daily as needed.      . furosemide (LASIX) 40 MG tablet Take 2 tablets (80 mg total) by mouth 2 (two) times daily.  120 tablet  11  . spironolactone (ALDACTONE) 25 MG tablet Take 1 tablet (25 mg total) by mouth every evening.  30 tablet  7   No current facility-administered medications for this encounter.   Filed Vitals:   04/07/14 0855  BP: 124/72  Pulse: 76  Resp: 16  Weight: 173 lb 8 oz (78.699 kg)  SpO2: 98%    PHYSICAL EXAM: General:  Well appearing. No resp difficulty HEENT: normal Neck: supple. JVP flat; Carotids 2+ bilaterally; no bruits. No lymphadenopathy or thryomegaly appreciated. Cor: PMI normal. Regular rate & rhythm. No rubs, gallops.  2/6 HSM apex. Lungs: distant lung sounds Abdomen: soft, tender, mildly distended. No hepatosplenomegaly. No bruits or  masses. Good bowel sounds. Extremities: no cyanosis, clubbing, rash, edema Neuro: alert & orientedx3, cranial nerves grossly intact. Moves all 4 extremities w/o difficulty. Affect pleasant.  ASSESSMENT & PLAN:  1) Chronic systolic HF: NICM, Cardiac MRI EF 32% (08/2013). EF 45% with moderate MR and mild to moderate AI (10/2013) - Admitted to the hospital 5/22-5/23 for CP and SOB. Her Cr on admission was up to 1.77 and lisinopril was stopped and diuretics were held. - Today she has NYHA II/III symptoms and volume status stable. Will decrease her lasix to 80 mg q am and 40 mg q pm. Relabeled her bottle and instructed her to call if weight  starts trending up. - Continue coreg 9.375 mg q am and 12.5 mg q pm along with spiro 25 mg daily. - Will try to restart low dose lisinopril 2.5 mg nightly. Will check BMET today with [facility] and in 7-10 days. AKI was likely related to over diuresis. - Check ECHO next visit.  - Reinforced the need and importance of daily weights, a low sodium diet, and fluid restriction (less than 2 L a day). Instructed to call the HF clinic if weight increases more than 3 lbs overnight or 5 lbs in a week.  2) Dyspnea: Followed by pulmonology Has sleep study today for OSA. PFTs pre CPX: FVC- 1.66 (66%), FEV1-1.19 (59%), FEV1/FVC 97% 3) HTN- Controlled on current medications. As above will try to restart low dose ACE-I 4) Nicotine abuse- Discussed the need to quit smoking. Lungs are limited on CPX. Currently smoking 2 cigarettes a day.  5) Anemia- Followed by PCP. Would like to have hysterectomy. Stable from HF perspective for hysterectomy. Continue iron   F/U 2 months with ECHO Stacey Lin 9:23 AM

## 2014-04-06 ENCOUNTER — Ambulatory Visit (HOSPITAL_BASED_OUTPATIENT_CLINIC_OR_DEPARTMENT_OTHER): Payer: Medicaid Other | Attending: Pulmonary Disease

## 2014-04-06 VITALS — Ht 60.0 in | Wt 172.0 lb

## 2014-04-06 DIAGNOSIS — R0609 Other forms of dyspnea: Secondary | ICD-10-CM | POA: Diagnosis not present

## 2014-04-06 DIAGNOSIS — R0683 Snoring: Secondary | ICD-10-CM

## 2014-04-06 DIAGNOSIS — G4710 Hypersomnia, unspecified: Secondary | ICD-10-CM | POA: Diagnosis not present

## 2014-04-06 DIAGNOSIS — R0989 Other specified symptoms and signs involving the circulatory and respiratory systems: Secondary | ICD-10-CM | POA: Diagnosis not present

## 2014-04-06 DIAGNOSIS — G4730 Sleep apnea, unspecified: Principal | ICD-10-CM

## 2014-04-07 ENCOUNTER — Encounter (HOSPITAL_COMMUNITY): Payer: Self-pay

## 2014-04-07 ENCOUNTER — Other Ambulatory Visit (HOSPITAL_COMMUNITY): Payer: Self-pay

## 2014-04-07 ENCOUNTER — Ambulatory Visit (HOSPITAL_COMMUNITY)
Admission: RE | Admit: 2014-04-07 | Discharge: 2014-04-07 | Disposition: A | Discharge status: Home / Self Care | Payer: Medicaid Other | Source: Ambulatory Visit | Attending: Internal Medicine | Admitting: Internal Medicine

## 2014-04-07 ENCOUNTER — Telehealth (HOSPITAL_COMMUNITY): Payer: Self-pay

## 2014-04-07 ENCOUNTER — Encounter: Payer: Self-pay | Admitting: Anesthesiology

## 2014-04-07 VITALS — BP 124/72 | HR 76 | Resp 16 | Wt 173.5 lb

## 2014-04-07 DIAGNOSIS — Z09 Encounter for follow-up examination after completed treatment for conditions other than malignant neoplasm: Secondary | ICD-10-CM | POA: Insufficient documentation

## 2014-04-07 DIAGNOSIS — D500 Iron deficiency anemia secondary to blood loss (chronic): Secondary | ICD-10-CM

## 2014-04-07 DIAGNOSIS — E7800 Pure hypercholesterolemia, unspecified: Secondary | ICD-10-CM | POA: Diagnosis not present

## 2014-04-07 DIAGNOSIS — I5042 Chronic combined systolic (congestive) and diastolic (congestive) heart failure: Secondary | ICD-10-CM | POA: Diagnosis present

## 2014-04-07 DIAGNOSIS — I499 Cardiac arrhythmia, unspecified: Secondary | ICD-10-CM | POA: Diagnosis not present

## 2014-04-07 DIAGNOSIS — F1910 Other psychoactive substance abuse, uncomplicated: Secondary | ICD-10-CM | POA: Diagnosis not present

## 2014-04-07 DIAGNOSIS — F17200 Nicotine dependence, unspecified, uncomplicated: Secondary | ICD-10-CM | POA: Diagnosis not present

## 2014-04-07 DIAGNOSIS — J45909 Unspecified asthma, uncomplicated: Secondary | ICD-10-CM | POA: Insufficient documentation

## 2014-04-07 DIAGNOSIS — N179 Acute kidney failure, unspecified: Secondary | ICD-10-CM

## 2014-04-07 DIAGNOSIS — Z79899 Other long term (current) drug therapy: Secondary | ICD-10-CM | POA: Diagnosis not present

## 2014-04-07 DIAGNOSIS — D649 Anemia, unspecified: Secondary | ICD-10-CM | POA: Diagnosis not present

## 2014-04-07 DIAGNOSIS — I509 Heart failure, unspecified: Secondary | ICD-10-CM

## 2014-04-07 DIAGNOSIS — I10 Essential (primary) hypertension: Secondary | ICD-10-CM

## 2014-04-07 DIAGNOSIS — I5022 Chronic systolic (congestive) heart failure: Secondary | ICD-10-CM

## 2014-04-07 MED ORDER — LISINOPRIL 2.5 MG PO TABS: 2.5000 mg | ORAL_TABLET | Freq: Every day | ORAL | Status: DC

## 2014-04-07 MED ORDER — FUROSEMIDE 40 MG PO TABS: ORAL_TABLET | ORAL | Status: DC

## 2014-04-07 NOTE — Patient Instructions (Addendum)
Doing great.  Start taking lisinopril 2.5 mg (1 tablet) at night.  Decrease lasix to 80 mg (2 tablets) in the morning and 40 mg (1 tablet) in the evening.   Follow up in 7-10 days.  Follow up in 2 months with ECHO.  Do the following things EVERYDAY: 1) Weigh yourself in the morning before breakfast. Write it down and keep it in a log. 2) Take your medicines as prescribed 3) Eat low salt foods-Limit salt (sodium) to 2000 mg per day.  4) Stay as active as you can everyday 5) Limit all fluids for the day to less than 2 liters 6)

## 2014-04-07 NOTE — Telephone Encounter (Signed)
Lab results reviewed with [facility].  Instructed to take 40 meq potassium today, and to also start lisinopril 2.5mg  once daily.  Aware and agreeable.

## 2014-04-14 ENCOUNTER — Ambulatory Visit (HOSPITAL_COMMUNITY)
Admission: RE | Admit: 2014-04-14 | Discharge: 2014-04-14 | Disposition: A | Discharge status: Home / Self Care | Payer: Medicaid Other | Source: Ambulatory Visit | Attending: Internal Medicine | Admitting: Internal Medicine

## 2014-04-14 DIAGNOSIS — I5022 Chronic systolic (congestive) heart failure: Secondary | ICD-10-CM

## 2014-04-15 ENCOUNTER — Encounter (HOSPITAL_COMMUNITY): Payer: Self-pay | Admitting: Adult Health

## 2014-04-15 ENCOUNTER — Telehealth (HOSPITAL_COMMUNITY): Payer: Self-pay

## 2014-04-15 NOTE — Telephone Encounter (Signed)
Patient called to report 6 lb weight loss since change in diuretic dosage by Amy clegg. Feels much better and appreciative. Renee Pain

## 2014-04-15 NOTE — Progress Notes (Unsigned)
Patient ID: Stacey Lin, female   DOB: 09/08/1966, 48 y.o.   MRN: [id number]   Asked to evaluate by research 04/14/14 for increased weight. Last visit lasix was cut back to 80 mg /40 mg .   Weight up 10 pounds in the last week. Increased SOB  Instructed to increase lasix back to 80 mg twice a day.   Received phone today 04/15/14 weight down 6 pounds and she feeling much better.   Continue current regimen.   [name],[name]  9:08 AM

## 2014-04-18 ENCOUNTER — Telehealth: Payer: Self-pay | Admitting: Pulmonary Disease

## 2014-04-18 DIAGNOSIS — G4710 Hypersomnia, unspecified: Secondary | ICD-10-CM | POA: Diagnosis not present

## 2014-04-18 DIAGNOSIS — G4730 Sleep apnea, unspecified: Secondary | ICD-10-CM

## 2014-04-18 NOTE — Sleep Study (Signed)
NAME: Stacey Lin DATE OF BIRTH:  January 10, 1966 MEDICAL RECORD NUMBER [id number]  LOCATION: [facility]  PHYSICIAN: Barbaraann Share  DATE OF STUDY: 04/06/2014  SLEEP STUDY TYPE: Nocturnal Polysomnogram               REFERRING PHYSICIAN: Clance, Maree Krabbe, MD  INDICATION FOR STUDY: Hypersomnia with sleep apnea  EPWORTH SLEEPINESS SCORE:  9 HEIGHT: 5' (152.4 cm)  WEIGHT: 172 lb (78.019 kg)    Body mass index is 33.59 kg/(m^2).  NECK SIZE: 14 in.  MEDICATIONS: Reviewed in the sleep record  SLEEP ARCHITECTURE: The patient had a total sleep time of 314 minutes with no slow-wave sleep and only 63 minutes of REM. Sleep onset latency was prolonged at 90 minutes, and REM onset was normal at 52 minutes. Sleep efficiency was moderately reduced at 75%.  RESPIRATORY DATA: The patient was found to have 2 apneas and 16 obstructive hypopneas, giving her an AHI of only 3 of events per hour. The events were not positional in nature, but were more prominent during REM.  OXYGEN DATA: There was transient oxygen desaturation as low as 88% with the patient's obstructive events  CARDIAC DATA: No clinically significant arrhythmias were noted  MOVEMENT/PARASOMNIA: No periodic limb movements or abnormal behaviors were seen  IMPRESSION/ RECOMMENDATION:    1) small numbers of obstructive events which do not meet the AHI criteria for the obstructive sleep apnea syndrome. The patient did have moderate snoring, and should be encouraged to work aggressively on weight loss.    Barbaraann Share Diplomate, American Board of Sleep Medicine  ELECTRONICALLY SIGNED ON:  04/18/2014, 6:39 PM [facility] PH: [phone]   FX: [phone] ACCREDITED BY THE AMERICAN ACADEMY OF SLEEP MEDICINE

## 2014-04-18 NOTE — Telephone Encounter (Signed)
Please let pt know that her sleep study does not show clinically significant sleep apnea.  She only had 3 events/hr of sleep, and normal is anything less than 5 per hour. She did snore throughout Should work on weight loss.

## 2014-04-21 ENCOUNTER — Encounter: Payer: Self-pay | Admitting: Internal Medicine

## 2014-04-22 NOTE — Telephone Encounter (Signed)
Pt advised. Jennifer Castillo, CMA  

## 2014-04-24 ENCOUNTER — Telehealth (HOSPITAL_COMMUNITY): Payer: Self-pay | Admitting: Vascular Surgery

## 2014-04-24 DIAGNOSIS — I509 Heart failure, unspecified: Secondary | ICD-10-CM

## 2014-04-24 MED ORDER — FUROSEMIDE 40 MG PO TABS: 80.0000 mg | ORAL_TABLET | Freq: Two times a day (BID) | ORAL | Status: DC

## 2014-04-24 NOTE — Telephone Encounter (Signed)
Spoke w/pt she just picked up her Furosemide and bottle says to take 2 tabs in AM and 1 tab in PM but she states Amy had told her to increase to 2 tabs Twice daily a week or 2 ago and that is what she has been taking, pt wants to make sure she is taking correct dose, per chart Darrick Grinder, NP did increase pt's dose to 80 mg Twice daily on 6/23, pt is sch for repeat labs on 7/7, advised pt to continue 2 tabs Twice daily and keep lab appt as sch, she is agreeable

## 2014-04-24 NOTE — Telephone Encounter (Signed)
Please call pt about new changes to medication. Please advise

## 2014-05-08 ENCOUNTER — Encounter: Payer: Self-pay | Admitting: Internal Medicine

## 2014-06-06 ENCOUNTER — Encounter (HOSPITAL_COMMUNITY): Payer: Self-pay

## 2014-06-06 ENCOUNTER — Ambulatory Visit (HOSPITAL_COMMUNITY)
Admission: RE | Admit: 2014-06-06 | Discharge: 2014-06-06 | Disposition: A | Discharge status: Home / Self Care | Payer: Medicaid Other | Source: Ambulatory Visit | Attending: Internal Medicine | Admitting: Internal Medicine

## 2014-06-06 ENCOUNTER — Encounter (HOSPITAL_COMMUNITY)
Admission: AD | Disposition: A | Discharge status: Home / Self Care | Payer: Self-pay | Source: Ambulatory Visit | Attending: Internal Medicine

## 2014-06-06 ENCOUNTER — Ambulatory Visit (HOSPITAL_BASED_OUTPATIENT_CLINIC_OR_DEPARTMENT_OTHER)
Admission: RE | Admit: 2014-06-06 | Discharge: 2014-06-06 | Disposition: A | Discharge status: Home / Self Care | Payer: Medicaid Other | Source: Ambulatory Visit | Attending: Internal Medicine | Admitting: Internal Medicine

## 2014-06-06 ENCOUNTER — Ambulatory Visit (HOSPITAL_COMMUNITY)
Admission: AD | Admit: 2014-06-06 | Discharge: 2014-06-06 | Disposition: A | Discharge status: Home / Self Care | Payer: Medicaid Other | Source: Ambulatory Visit | Attending: Internal Medicine | Admitting: Internal Medicine

## 2014-06-06 VITALS — BP 116/86 | HR 70 | Wt 180.2 lb

## 2014-06-06 DIAGNOSIS — I5022 Chronic systolic (congestive) heart failure: Secondary | ICD-10-CM

## 2014-06-06 DIAGNOSIS — E7800 Pure hypercholesterolemia, unspecified: Secondary | ICD-10-CM | POA: Insufficient documentation

## 2014-06-06 DIAGNOSIS — R0609 Other forms of dyspnea: Secondary | ICD-10-CM

## 2014-06-06 DIAGNOSIS — I428 Other cardiomyopathies: Secondary | ICD-10-CM | POA: Diagnosis not present

## 2014-06-06 DIAGNOSIS — I059 Rheumatic mitral valve disease, unspecified: Secondary | ICD-10-CM | POA: Diagnosis not present

## 2014-06-06 DIAGNOSIS — F17200 Nicotine dependence, unspecified, uncomplicated: Secondary | ICD-10-CM

## 2014-06-06 DIAGNOSIS — J45909 Unspecified asthma, uncomplicated: Secondary | ICD-10-CM | POA: Insufficient documentation

## 2014-06-06 DIAGNOSIS — I359 Nonrheumatic aortic valve disorder, unspecified: Secondary | ICD-10-CM | POA: Diagnosis not present

## 2014-06-06 DIAGNOSIS — D649 Anemia, unspecified: Secondary | ICD-10-CM | POA: Diagnosis not present

## 2014-06-06 DIAGNOSIS — I10 Essential (primary) hypertension: Secondary | ICD-10-CM | POA: Diagnosis not present

## 2014-06-06 DIAGNOSIS — J45902 Unspecified asthma with status asthmaticus: Secondary | ICD-10-CM | POA: Diagnosis not present

## 2014-06-06 DIAGNOSIS — I5042 Chronic combined systolic (congestive) and diastolic (congestive) heart failure: Secondary | ICD-10-CM | POA: Insufficient documentation

## 2014-06-06 DIAGNOSIS — I509 Heart failure, unspecified: Secondary | ICD-10-CM

## 2014-06-06 DIAGNOSIS — R079 Chest pain, unspecified: Secondary | ICD-10-CM

## 2014-06-06 DIAGNOSIS — R0989 Other specified symptoms and signs involving the circulatory and respiratory systems: Secondary | ICD-10-CM | POA: Insufficient documentation

## 2014-06-06 DIAGNOSIS — I200 Unstable angina: Secondary | ICD-10-CM

## 2014-06-06 DIAGNOSIS — R0600 Dyspnea, unspecified: Secondary | ICD-10-CM

## 2014-06-06 HISTORY — PX: LEFT AND RIGHT HEART CATHETERIZATION WITH CORONARY ANGIOGRAM: SHX5449

## 2014-06-06 LAB — CBC
HCT: 33.9 % — ABNORMAL LOW (ref 36.0–46.0)
Hemoglobin: 10.9 g/dL — ABNORMAL LOW (ref 12.0–15.0)
MCH: 29.8 pg (ref 26.0–34.0)
MCHC: 32.2 g/dL (ref 30.0–36.0)
MCV: 92.6 fL (ref 78.0–100.0)
PLATELETS: 342 10*3/uL (ref 150–400)
RBC: 3.66 MIL/uL — AB (ref 3.87–5.11)
RDW: 12.4 % (ref 11.5–15.5)
WBC: 9.6 10*3/uL (ref 4.0–10.5)

## 2014-06-06 LAB — BASIC METABOLIC PANEL
Anion gap: 14 (ref 5–15)
BUN: 24 mg/dL — AB (ref 6–23)
CO2: 27 mEq/L (ref 19–32)
Calcium: 9.6 mg/dL (ref 8.4–10.5)
Chloride: 98 mEq/L (ref 96–112)
Creatinine, Ser: 1.25 mg/dL — ABNORMAL HIGH (ref 0.50–1.10)
GFR calc Af Amer: 57 mL/min — ABNORMAL LOW (ref >90)
GFR, EST NON AFRICAN AMERICAN: 49 mL/min — AB (ref >90)
Glucose, Bld: 128 mg/dL — ABNORMAL HIGH (ref 70–99)
POTASSIUM: 3.4 meq/L — AB (ref 3.7–5.3)
Sodium: 139 mEq/L (ref 137–147)

## 2014-06-06 LAB — POCT I-STAT 3, VENOUS BLOOD GAS (G3P V)
Acid-Base Excess: 3 mmol/L — ABNORMAL HIGH (ref 0.0–2.0)
Acid-Base Excess: 4 mmol/L — ABNORMAL HIGH (ref 0.0–2.0)
BICARBONATE: 30.3 meq/L — AB (ref 20.0–24.0)
Bicarbonate: 28.8 mEq/L — ABNORMAL HIGH (ref 20.0–24.0)
O2 Saturation: 63 %
O2 Saturation: 63 %
PCO2 VEN: 50 mmHg (ref 45.0–50.0)
PH VEN: 7.368 — AB (ref 7.250–7.300)
PO2 VEN: 34 mmHg (ref 30.0–45.0)
TCO2: 30 mmol/L (ref 0–100)
TCO2: 32 mmol/L (ref 0–100)
pCO2, Ven: 51.9 mmHg — ABNORMAL HIGH (ref 45.0–50.0)
pH, Ven: 7.374 — ABNORMAL HIGH (ref 7.250–7.300)
pO2, Ven: 34 mmHg (ref 30.0–45.0)

## 2014-06-06 LAB — TROPONIN I

## 2014-06-06 LAB — PROTIME-INR
INR: 1.06 (ref 0.00–1.49)
Prothrombin Time: 13.8 seconds (ref 11.6–15.2)

## 2014-06-06 LAB — POCT I-STAT 3, ART BLOOD GAS (G3+)
ACID-BASE EXCESS: 3 mmol/L — AB (ref 0.0–2.0)
Bicarbonate: 27.8 mEq/L — ABNORMAL HIGH (ref 20.0–24.0)
O2 Saturation: 96 %
PH ART: 7.426 (ref 7.350–7.450)
TCO2: 29 mmol/L (ref 0–100)
pCO2 arterial: 42.3 mmHg (ref 35.0–45.0)
pO2, Arterial: 82 mmHg (ref 80.0–100.0)

## 2014-06-06 SURGERY — LEFT AND RIGHT HEART CATHETERIZATION WITH CORONARY ANGIOGRAM
Anesthesia: LOCAL

## 2014-06-06 MED ORDER — MIDAZOLAM HCL 2 MG/2ML IJ SOLN
INTRAMUSCULAR | Status: AC
Start: 2014-06-06 — End: 2014-06-06
  Filled 2014-06-06: qty 2

## 2014-06-06 MED ORDER — NITROGLYCERIN 1 MG/10 ML FOR IR/CATH LAB
INTRA_ARTERIAL | Status: AC
  Filled 2014-06-06: qty 10

## 2014-06-06 MED ORDER — SODIUM CHLORIDE 0.9 % IV SOLN: INTRAVENOUS | Status: DC

## 2014-06-06 MED ORDER — MIDAZOLAM HCL 2 MG/2ML IJ SOLN
INTRAMUSCULAR | Status: AC
  Filled 2014-06-06: qty 2

## 2014-06-06 MED ORDER — MORPHINE SULFATE 2 MG/ML IJ SOLN
INTRAMUSCULAR | Status: AC
  Filled 2014-06-06: qty 1

## 2014-06-06 MED ORDER — POTASSIUM CHLORIDE CRYS ER 20 MEQ PO TBCR
EXTENDED_RELEASE_TABLET | ORAL | Status: AC
  Filled 2014-06-06: qty 2

## 2014-06-06 MED ORDER — OXYCODONE-ACETAMINOPHEN 5-325 MG PO TABS
2.0000 | ORAL_TABLET | Freq: Once | ORAL | Status: AC
  Administered 2014-06-06: 2 via ORAL

## 2014-06-06 MED ORDER — ASPIRIN 81 MG PO CHEW
CHEWABLE_TABLET | ORAL | Status: AC
  Filled 2014-06-06: qty 1

## 2014-06-06 MED ORDER — HEPARIN (PORCINE) IN NACL 2-0.9 UNIT/ML-% IJ SOLN
INTRAMUSCULAR | Status: AC
  Filled 2014-06-06: qty 1000

## 2014-06-06 MED ORDER — SODIUM CHLORIDE 0.9 % IV SOLN: 250.0000 mL | INTRAVENOUS | Status: DC | PRN

## 2014-06-06 MED ORDER — SODIUM CHLORIDE 0.9 % IJ SOLN
3.0000 mL | INTRAMUSCULAR | Status: DC | PRN
Start: 2014-06-06 — End: 2014-06-06

## 2014-06-06 MED ORDER — OXYCODONE-ACETAMINOPHEN 5-325 MG PO TABS
ORAL_TABLET | ORAL | Status: AC
  Filled 2014-06-06: qty 2

## 2014-06-06 MED ORDER — SODIUM CHLORIDE 0.9 % IJ SOLN: 3.0000 mL | Freq: Two times a day (BID) | INTRAMUSCULAR | Status: DC

## 2014-06-06 MED ORDER — ASPIRIN 81 MG PO CHEW
81.0000 mg | CHEWABLE_TABLET | ORAL | Status: AC
  Administered 2014-06-06: 81 mg via ORAL

## 2014-06-06 MED ORDER — POTASSIUM CHLORIDE CRYS ER 20 MEQ PO TBCR
40.0000 meq | EXTENDED_RELEASE_TABLET | Freq: Once | ORAL | Status: AC
  Administered 2014-06-06: 40 meq via ORAL

## 2014-06-06 MED ORDER — LIDOCAINE HCL (PF) 1 % IJ SOLN
INTRAMUSCULAR | Status: AC
  Filled 2014-06-06: qty 30

## 2014-06-06 MED ORDER — MORPHINE SULFATE 10 MG/ML IJ SOLN
2.0000 mg | Freq: Once | INTRAMUSCULAR | Status: AC
Start: 2014-06-06 — End: 2014-06-06
  Administered 2014-06-06: 2 mg via INTRAVENOUS

## 2014-06-06 MED ORDER — FENTANYL CITRATE 0.05 MG/ML IJ SOLN
INTRAMUSCULAR | Status: AC
  Filled 2014-06-06: qty 2

## 2014-06-06 MED ORDER — VERAPAMIL HCL 2.5 MG/ML IV SOLN
INTRAVENOUS | Status: AC
  Filled 2014-06-06: qty 2

## 2014-06-06 MED ORDER — SODIUM CHLORIDE 0.9 % IV SOLN
INTRAVENOUS | Status: DC
  Administered 2014-06-06: 50 mL/h via INTRAVENOUS

## 2014-06-06 NOTE — Progress Notes (Signed)
Patient ID: Stacey Lin, female   DOB: [birth year], 48 y.o.   MRN: [id number]  PCP: [facility] Pulmonologist: Dr. Shelle Iron  HPI: Stacey Lin is a 48 yo female with a history of anemia, combined systolic/diastolic heart failure, HTN, and history of polysubstance abuse. Previous ECHO (04/2010) EF 50-55% with grd 1 DD, LVIDd 47mm, mild to mod MR, RA and LA mildly dilated.   Admitted to the hospital 08/19/13-08/29/13 with A/C systolic HF. Discharge weight 144 lbs.  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 Mod MR and Mod-sev TR Echo (1/15): EF 45%, mild to moderate AI, moderate MR, RV normal size with mildly decreased systolic function.   Follow up for Heart Failure: Last visit started lisinopril 2.5 mg daily and cut lasix back to 80 mg q am and 40 mg qpm, however weight started increasing and increased lasix back to 80 mg BID. Started having CP 7/10 in waiting room and brought back to clinic. Reports pain has been going on for a week off and on and feels like stabbing, worse with activity and gets a little better with sitting down. Reports lasts a couple of minutes. Increased SOB. Weight has been trending up.   Labs (1/15): K 4.1, creatinine 0.96, hemoglobin 10.1 Labs (12/04/13) K 4.3 Creatinine 0.97  Labs (4/15) K+4.1, Creatinine 0.88          (6/15) Hgb 10.9, K 3.8, creatinine 1.19  SH: Living with her son in [place]. No ETOH or drug use. Filling for disability. Tobacco abuse  FH: Mother deceased: Dementia, seizures, stomach cancer        Father deceased: HTN  ROS: All systems negative except as listed in HPI, PMH and Problem List.  Past Medical History  Diagnosis Date  . Hypertension   . Anemia   . Arrhythmia   . Hypercholesteremia   . Chronic headaches   . Asthma      Current Outpatient Prescriptions  Medication Sig Dispense Refill  . atorvastatin (LIPITOR) 40 MG tablet Take 1 tablet (40 mg total) by mouth daily at 6 PM.  30 tablet  11  . carvedilol (COREG) 6.25 MG tablet Take 9.375-12.5 mg by mouth 2 (two) times daily with a meal. Take 1.5 tablets in the morning Take 2 tablets in the evening      . ferrous sulfate 325 (65 FE) MG tablet Take 325 mg by mouth daily with breakfast.      . Fluticasone Furoate-Vilanterol (BREO ELLIPTA) 100-25 MCG/INH AEPB Inhale 1 spray into the lungs 3 (three) times daily as needed.      . furosemide (LASIX) 40 MG tablet Take 2 tablets (80 mg total) by mouth 2 (two) times daily.  90 tablet  11  . lisinopril (ZESTRIL) 2.5 MG tablet Take 1 tablet (2.5 mg total) by mouth daily.  30 tablet  3  . Melatonin 10 MG TABS Take 3 tablets by mouth at bedtime.      Marland Kitchen spironolactone (ALDACTONE) 25 MG tablet Take 1 tablet (25 mg total) by mouth every evening.  30 tablet  7   No current facility-administered medications for this encounter.   Filed Vitals:   06/06/14 0954  BP: 116/86  Pulse: 70  Weight: 180 lb 4 oz (81.761 kg)  SpO2: 97%    PHYSICAL EXAM: General:  Very  upset and crying; appearing; No resp difficulty HEENT: normal Neck: supple. JVP difficult to assess d/t body habitus but appears mildly elevated; Carotids 2+ bilaterally; no bruits. No lymphadenopathy or thryomegaly appreciated. Cor: PMI normal. Regular rate & rhythm. No rubs, gallops.  2/6 HSM apex. Lungs: distant lung sounds Abdomen: soft, tender, mildly distended. No hepatosplenomegaly. No bruits or masses. Good bowel sounds. Extremities: no cyanosis, clubbing, rash, edema Neuro: alert & orientedx3, cranial nerves grossly intact. Moves all 4 extremities w/o difficulty. Affect pleasant.   EKG: NSR, 63 bpm ASSESSMENT & PLAN:  1) Chronic systolic HF: NICM, Cardiac MRI EF 32% (08/2013). EF 45% with moderate MR and mild to moderate AI (10/2013) - Patient had  ECHO today which is pending. - Reports that she has been having CP off and on that lasts a couple of minutes and goes down L arm. Reports she was having it earlier. Last admission was for CP and troponin was negative. EKG shows NSR. Stat tropnonin trending. - Discussed with Dr. Gala Romney and will take for [facility] today. Her weight has been trending up and has been difficult to assess volume status. She has had increased SOB, however she also has asthmatic bronchitis and being followed by pulmonary.  - continue medications at current dose - Reinforced the need and importance of daily weights, a low sodium diet, and fluid restriction (less than 2 L a day). Instructed to call the HF clinic if weight increases more than 3 lbs overnight or 5 lbs in a week.  2) Dyspnea: Followed by pulmonology Has sleep study today for OSA. PFTs pre CPX: FVC- 1.66 (66%), FEV1-1.19 (59%), FEV1/FVC 97% 3) HTN- Controlled on current medications.  4) Nicotine abuse- Discussed the need to quit smoking. Lungs are limited on CPX. Currently smoking 2 cigarettes a day.  5) Anemia- Followed by PCP. Would like to have hysterectomy. Stable from HF perspective for hysterectomy. Continue iron    Stacey Lin B NP-C 10:39 AM

## 2014-06-06 NOTE — Progress Notes (Signed)
Client states has had tubal ligation

## 2014-06-06 NOTE — H&P (View-Only) (Signed)
Patient ID: Jalayia Elizardo, female   DOB: [birth year], 48 y.o.   MRN: [id number]  PCP: [facility] Pulmonologist: Dr. Shelle Iron  HPI: [name] is a 48 yo female with a history of anemia, combined systolic/diastolic heart failure, HTN, and history of polysubstance abuse. Previous ECHO (04/2010) EF 50-55% with grd 1 DD, LVIDd 47mm, mild to mod MR, RA and LA mildly dilated.   Admitted to the hospital 08/19/13-08/29/13 with A/C systolic HF. Discharge weight 144 lbs.  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 Mod MR and Mod-sev TR Echo (1/15): EF 45%, mild to moderate AI, moderate MR, RV normal size with mildly decreased systolic function.   Follow up for Heart Failure: Last visit started lisinopril 2.5 mg daily and cut lasix back to 80 mg q am and 40 mg qpm, however weight started increasing and increased lasix back to 80 mg BID. Started having CP 7/10 in waiting room and brought back to clinic. Reports pain has been going on for a week off and on and feels like stabbing, worse with activity and gets a little better with sitting down. Reports lasts a couple of minutes. Increased SOB. Weight has been trending up.   Labs (1/15): K 4.1, creatinine 0.96, hemoglobin 10.1 Labs (12/04/13) K 4.3 Creatinine 0.97  Labs (4/15) K+4.1, Creatinine 0.88          (6/15) Hgb 10.9, K 3.8, creatinine 1.19  SH: Living with her son in [place]. No ETOH or drug use. Filling for disability. Tobacco abuse  FH: Mother deceased: Dementia, seizures, stomach cancer        Father deceased: HTN  ROS: All systems negative except as listed in HPI, PMH and Problem List.  Past Medical History  Diagnosis Date  . Hypertension   . Anemia   . Arrhythmia   . Hypercholesteremia   . Chronic headaches   . Asthma      Current Outpatient Prescriptions  Medication Sig Dispense Refill  . atorvastatin (LIPITOR) 40 MG tablet Take 1 tablet (40 mg total) by mouth daily at 6 PM.  30 tablet  11  . carvedilol (COREG) 6.25 MG tablet Take 9.375-12.5 mg by mouth 2 (two) times daily with a meal. Take 1.5 tablets in the morning Take 2 tablets in the evening      . ferrous sulfate 325 (65 FE) MG tablet Take 325 mg by mouth daily with breakfast.      . Fluticasone Furoate-Vilanterol (BREO ELLIPTA) 100-25 MCG/INH AEPB Inhale 1 spray into the lungs 3 (three) times daily as needed.      . furosemide (LASIX) 40 MG tablet Take 2 tablets (80 mg total) by mouth 2 (two) times daily.  90 tablet  11  . lisinopril (ZESTRIL) 2.5 MG tablet Take 1 tablet (2.5 mg total) by mouth daily.  30 tablet  3  . Melatonin 10 MG TABS Take 3 tablets by mouth at bedtime.      Marland Kitchen spironolactone (ALDACTONE) 25 MG tablet Take 1 tablet (25 mg total) by mouth every evening.  30 tablet  7   No current facility-administered medications for this encounter.   Filed Vitals:   06/06/14 0954  BP: 116/86  Pulse: 70  Weight: 180 lb 4 oz (81.761 kg)  SpO2: 97%    PHYSICAL EXAM: General:  Very  upset and crying; appearing; No resp difficulty HEENT: normal Neck: supple. JVP difficult to assess d/t body habitus but appears mildly elevated; Carotids 2+ bilaterally; no bruits. No lymphadenopathy or thryomegaly appreciated. Cor: PMI normal. Regular rate & rhythm. No rubs, gallops.  2/6 HSM apex. Lungs: distant lung sounds Abdomen: soft, tender, mildly distended. No hepatosplenomegaly. No bruits or masses. Good bowel sounds. Extremities: no cyanosis, clubbing, rash, edema Neuro: alert & orientedx3, cranial nerves grossly intact. Moves all 4 extremities w/o difficulty. Affect pleasant.   EKG: NSR, 63 bpm ASSESSMENT & PLAN:  1) Chronic systolic HF: NICM, Cardiac MRI EF 32% (08/2013). EF 45% with moderate MR and mild to moderate AI (10/2013) - Patient had  ECHO today which is pending. - Reports that she has been having CP off and on that lasts a couple of minutes and goes down L arm. Reports she was having it earlier. Last admission was for CP and troponin was negative. EKG shows NSR. Stat tropnonin trending. - Discussed with Dr. Gala Romney and will take for [facility] today. Her weight has been trending up and has been difficult to assess volume status. She has had increased SOB, however she also has asthmatic bronchitis and being followed by pulmonary.  - continue medications at current dose - Reinforced the need and importance of daily weights, a low sodium diet, and fluid restriction (less than 2 L a day). Instructed to call the HF clinic if weight increases more than 3 lbs overnight or 5 lbs in a week.  2) Dyspnea: Followed by pulmonology Has sleep study today for OSA. PFTs pre CPX: FVC- 1.66 (66%), FEV1-1.19 (59%), FEV1/FVC 97% 3) HTN- Controlled on current medications.  4) Nicotine abuse- Discussed the need to quit smoking. Lungs are limited on CPX. Currently smoking 2 cigarettes a day.  5) Anemia- Followed by PCP. Would like to have hysterectomy. Stable from HF perspective for hysterectomy. Continue iron    Ulla Potash B NP-C 10:39 AM

## 2014-06-06 NOTE — CV Procedure (Signed)
Cardiac Cath Procedure Note  Indication: Unstable angina & HF  Procedures performed:  1) Right heart cathererization 2) Selective coronary angiography 3) Left heart catheterization 4) Left ventriculogram  Description of procedure:     The risks and indication of the procedure were explained. Consent was signed and placed on the chart. An appropriate timeout was taken prior to the procedure.   Initially we planned a right brachial/radial approach. The pre-existing PIV in the RAC fossa was changed out for a 5FR venous sheath and a 5FR Swan was inserted. We were ablt to advance this into the RA without difficulty but even with guidewire support we were unable to advance further due to buckling of the catheter in the R arm.   The R radial artery was cannulated without difficulty but we were unable to pass the access wire due to severe spasm. Thus the arm approach was abandoned and we turned our attention to the R femoral area.   The right groin was prepped and draped in the routine sterile fashion and anesthetized with 1% local lidocaine.   A 5 FR arterial sheath was placed in the right femoral artery using a modified Seldinger technique. Standard catheters including a JL3.5, JR4 and angled pigtail were used. All catheter exchanges were made over a wire. A 7 FR venous sheath was placed in the right femoral vein using a modified Seldinger technique. A standard Swan-Ganz catheter was used for the procedure.   Complications:  None apparent  Findings:  RA = 7 RV = 30/1/6 PA = 25/10/14 PCW = 15 (no significant v-waves) Fick cardiac output/index = 4.9/2.7 PVR = < 1.0 WU FA sat = 96% PA sat = 63%, 63%  Ao Pressure: 91/53 (68) LV Pressure: 96/11/17 There was no signficant gradient across the aortic valve on pullback.  Left main: Normal. Long. Bifurcates into LAD and LCX  LAD: Ectatic vessel with lateral course. Gives off 1 diagonal. Normal.  LCX: Ectatic vessel with large OM-1. Small  Om-2. Normal.   RCA: Dominant vessel. Normal  LV-gram done in the RAO projection: Ejection fraction = 50-55% No significant MR noted  Assessment: 1. 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 Normal EF 3. Normal filling pressures and output  Plan/Discussion:  Chest pain and dyspnea likely non-cardiac. Cardiomyopathy has improved. Home after bedrest.   Glori Bickers, MD 3:42 PM

## 2014-06-06 NOTE — Interval H&P Note (Signed)
History and Physical Interval Note:  06/06/2014 2:35 PM  Stacey Lin  has presented today for surgery, with the diagnosis of hf and unstable angin The various methods of treatment have been discussed with the patient and family. After consideration of risks, benefits and other options for treatment, the patient has consented to  Procedure(s): LEFT AND RIGHT HEART CATHETERIZATION WITH CORONARY ANGIOGRAM (N/A) and possible angioplasty as a surgical intervention .  The patient's history has been reviewed, patient examined, no change in status, stable for surgery.  I have reviewed the patient's chart and labs.  Questions were answered to the patient's satisfaction.    Cath Lab Visit (complete for each Cath Lab visit)  Clinical Evaluation Leading to the Procedure:   ACS: Yes.    Non-ACS:    Anginal Classification: CCS IV  Anti-ischemic medical therapy: Minimal Therapy (1 class of medications)  Non-Invasive Test Results: No non-invasive testing performed  Prior CABG: No previous CABG       Glori Bickers

## 2014-06-06 NOTE — Discharge Instructions (Signed)

## 2014-06-06 NOTE — Progress Notes (Signed)
*  PRELIMINARY RESULTS* Echocardiogram 2D Echocardiogram has been performed.  Stacey Lin 06/06/2014, 10:41 AM

## 2014-06-06 NOTE — Progress Notes (Signed)
States relief from chest pain and shortness of breath

## 2014-06-06 NOTE — Progress Notes (Signed)
Called to short stay to assess an arteriotomy that was not accessed for a radial procedure. Pt c/o pain call to MD, no neurovascular compromise to right hand. Pt denies numbness, cap refill less than 3 seconds, sensation intact. No hematoma noted, noted ecchymotic area to r radial area. Arteriotomy noted intact site is clear no reddness, no s/s of infection, Site redressed, ace wrap applied pt advised to keep extremity elevated at the site of heart and above. Pt verbalizes understanding. Dr. Sung Amabile aware and Threasa Beards RN Made aware to call Dr. Wynonia Lawman he will be covering MD if there are any problems.

## 2014-06-07 NOTE — Addendum Note (Signed)
Encounter addended by: Vanessa Barbara, CCT on: 06/07/2014 10:25 AM<BR>     Documentation filed: Charges VN

## 2014-06-10 ENCOUNTER — Telehealth (HOSPITAL_COMMUNITY): Payer: Self-pay | Admitting: Anesthesiology

## 2014-06-10 NOTE — Telephone Encounter (Signed)
Patient admitted for [facility] last week from clinic. She underwent cath and showed ectatic coronaries (suspect due to HTN) with no significant atherosclerosis and normal EF. No significant MR noted.  ECHO reviewed and EF 55-60%, mild/mod AI, grade I DD and mild MR.   Reviewed cath and ECHO with patient and discussed that she is doing well and EF has improved. Can schedule her out at least 3 months.   Junie Bame B NP-C 5:04 PM

## 2014-06-19 ENCOUNTER — Telehealth: Payer: Self-pay | Admitting: *Deleted

## 2014-06-19 NOTE — Telephone Encounter (Signed)
I called patient to let her know labs were okay and no changes were needed to medications.

## 2014-06-20 ENCOUNTER — Encounter: Payer: Self-pay | Admitting: Internal Medicine

## 2014-06-25 ENCOUNTER — Other Ambulatory Visit (HOSPITAL_COMMUNITY): Payer: Self-pay | Admitting: Anesthesiology

## 2014-07-01 ENCOUNTER — Emergency Department (HOSPITAL_COMMUNITY): Payer: Medicaid Other

## 2014-07-01 ENCOUNTER — Encounter: Payer: Self-pay | Admitting: Internal Medicine

## 2014-07-01 ENCOUNTER — Encounter (HOSPITAL_COMMUNITY): Payer: Self-pay | Admitting: Emergency Medicine

## 2014-07-01 ENCOUNTER — Ambulatory Visit: Payer: Medicaid Other | Attending: Internal Medicine | Admitting: Internal Medicine

## 2014-07-01 ENCOUNTER — Emergency Department (HOSPITAL_COMMUNITY)
Admission: EM | Admit: 2014-07-01 | Discharge: 2014-07-01 | Disposition: A | Discharge status: Home / Self Care | Payer: Medicaid Other | Attending: Emergency Medicine | Admitting: Emergency Medicine

## 2014-07-01 VITALS — BP 100/68 | HR 64 | Temp 97.8°F | Resp 16 | Ht 60.0 in | Wt 189.0 lb

## 2014-07-01 DIAGNOSIS — G8929 Other chronic pain: Secondary | ICD-10-CM | POA: Diagnosis not present

## 2014-07-01 DIAGNOSIS — M79609 Pain in unspecified limb: Secondary | ICD-10-CM

## 2014-07-01 DIAGNOSIS — F17200 Nicotine dependence, unspecified, uncomplicated: Secondary | ICD-10-CM | POA: Diagnosis not present

## 2014-07-01 DIAGNOSIS — M25519 Pain in unspecified shoulder: Secondary | ICD-10-CM | POA: Diagnosis not present

## 2014-07-01 DIAGNOSIS — D649 Anemia, unspecified: Secondary | ICD-10-CM | POA: Diagnosis not present

## 2014-07-01 DIAGNOSIS — M79641 Pain in right hand: Secondary | ICD-10-CM

## 2014-07-01 DIAGNOSIS — Z79899 Other long term (current) drug therapy: Secondary | ICD-10-CM | POA: Insufficient documentation

## 2014-07-01 DIAGNOSIS — I509 Heart failure, unspecified: Secondary | ICD-10-CM

## 2014-07-01 DIAGNOSIS — I10 Essential (primary) hypertension: Secondary | ICD-10-CM | POA: Insufficient documentation

## 2014-07-01 DIAGNOSIS — IMO0001 Reserved for inherently not codable concepts without codable children: Secondary | ICD-10-CM | POA: Diagnosis not present

## 2014-07-01 DIAGNOSIS — E7800 Pure hypercholesterolemia, unspecified: Secondary | ICD-10-CM | POA: Insufficient documentation

## 2014-07-01 DIAGNOSIS — J45909 Unspecified asthma, uncomplicated: Secondary | ICD-10-CM | POA: Insufficient documentation

## 2014-07-01 DIAGNOSIS — I5022 Chronic systolic (congestive) heart failure: Secondary | ICD-10-CM

## 2014-07-01 DIAGNOSIS — M25511 Pain in right shoulder: Secondary | ICD-10-CM

## 2014-07-01 DIAGNOSIS — Z48812 Encounter for surgical aftercare following surgery on the circulatory system: Secondary | ICD-10-CM | POA: Insufficient documentation

## 2014-07-01 DIAGNOSIS — I499 Cardiac arrhythmia, unspecified: Secondary | ICD-10-CM | POA: Insufficient documentation

## 2014-07-01 DIAGNOSIS — T819XXA Unspecified complication of procedure, initial encounter: Secondary | ICD-10-CM | POA: Diagnosis not present

## 2014-07-01 DIAGNOSIS — M79643 Pain in unspecified hand: Secondary | ICD-10-CM | POA: Insufficient documentation

## 2014-07-01 LAB — CBC WITH DIFFERENTIAL/PLATELET
BASOS ABS: 0 10*3/uL (ref 0.0–0.1)
Basophils Relative: 0 % (ref 0–1)
EOS PCT: 4 % (ref 0–5)
Eosinophils Absolute: 0.3 10*3/uL (ref 0.0–0.7)
HEMATOCRIT: 32.4 % — AB (ref 36.0–46.0)
HEMOGLOBIN: 10.2 g/dL — AB (ref 12.0–15.0)
LYMPHS ABS: 1.8 10*3/uL (ref 0.7–4.0)
LYMPHS PCT: 23 % (ref 12–46)
MCH: 30.2 pg (ref 26.0–34.0)
MCHC: 31.5 g/dL (ref 30.0–36.0)
MCV: 95.9 fL (ref 78.0–100.0)
MONO ABS: 0.4 10*3/uL (ref 0.1–1.0)
MONOS PCT: 5 % (ref 3–12)
NEUTROS ABS: 5.2 10*3/uL (ref 1.7–7.7)
Neutrophils Relative %: 68 % (ref 43–77)
Platelets: 250 10*3/uL (ref 150–400)
RBC: 3.38 MIL/uL — ABNORMAL LOW (ref 3.87–5.11)
RDW: 12.8 % (ref 11.5–15.5)
WBC: 7.8 10*3/uL (ref 4.0–10.5)

## 2014-07-01 LAB — I-STAT CHEM 8, ED
BUN: 23 mg/dL (ref 6–23)
CALCIUM ION: 1.11 mmol/L — AB (ref 1.12–1.23)
CHLORIDE: 102 meq/L (ref 96–112)
CREATININE: 1.5 mg/dL — AB (ref 0.50–1.10)
GLUCOSE: 113 mg/dL — AB (ref 70–99)
HCT: 33 % — ABNORMAL LOW (ref 36.0–46.0)
Hemoglobin: 11.2 g/dL — ABNORMAL LOW (ref 12.0–15.0)
Potassium: 3.3 mEq/L — ABNORMAL LOW (ref 3.7–5.3)
Sodium: 138 mEq/L (ref 137–147)
TCO2: 26 mmol/L (ref 0–100)

## 2014-07-01 MED ORDER — MORPHINE SULFATE 4 MG/ML IJ SOLN
4.0000 mg | Freq: Once | INTRAMUSCULAR | Status: AC
  Administered 2014-07-01: 4 mg via INTRAVENOUS
  Filled 2014-07-01: qty 1

## 2014-07-01 MED ORDER — OXYCODONE-ACETAMINOPHEN 5-325 MG PO TABS: 1.0000 | ORAL_TABLET | Freq: Four times a day (QID) | ORAL | Status: DC | PRN

## 2014-07-01 MED ORDER — KETOROLAC TROMETHAMINE 30 MG/ML IJ SOLN
30.0000 mg | Freq: Once | INTRAMUSCULAR | Status: AC
  Administered 2014-07-01: 30 mg via INTRAVENOUS

## 2014-07-01 MED ORDER — SODIUM CHLORIDE 0.9 % IV BOLUS (SEPSIS)
1000.0000 mL | Freq: Once | INTRAVENOUS | Status: AC
  Administered 2014-07-01: 1000 mL via INTRAVENOUS

## 2014-07-01 MED ORDER — IOHEXOL 350 MG/ML SOLN
100.0000 mL | Freq: Once | INTRAVENOUS | Status: AC | PRN
  Administered 2014-07-01: 100 mL via INTRAVENOUS

## 2014-07-01 NOTE — Discharge Instructions (Signed)
We saw you in the ER for the shoulder pain. All the results in the ER are normal, labs and imaging. We are not sure what is causing your symptoms. The workup in the ER is not complete, and is limited to screening for life threatening and emergent conditions only, so please see a primary care doctor for further evaluation.

## 2014-07-01 NOTE — ED Provider Notes (Signed)
CSN: [id number]     Arrival date & time 07/01/14  1041 History   First MD Initiated Contact with Patient 07/01/14 1548     Chief Complaint  Patient presents with  . Shoulder Pain     (Consider location/radiation/quality/duration/timing/severity/associated sxs/prior Treatment) HPI Comments: Pt comes in with cc of rt shoulder pain. Had a right sided cath on 14th, and since then she has had pain to the RUE. Pt states that gradually the pain has become severe. Pain worse with certain positions and with movement. There is some numbness in her fingers. Pt has no increased swelling. Pt has been taking T#3 and motrin without much relief. Pain is not worse with palpation in the are. Pt unable to describe the pain, but states that it is severe.  Patient is a 48 y.o. female presenting with shoulder pain. The history is provided by the patient.  Shoulder Pain Pertinent negatives include no chest pain, no abdominal pain, no headaches and no shortness of breath.    Past Medical History  Diagnosis Date  . Hypertension   . Anemia   . Arrhythmia   . Hypercholesteremia   . Chronic headaches   . Asthma    Past Surgical History  Procedure Laterality Date  . Tubal ligation  1986   Family History  Problem Relation Age of Onset  . Hypertension Mother   . Hypertension Father   . Hypertension Maternal Grandmother   . Allergies Mother   . Asthma Mother   . Heart disease Mother    History  Substance Use Topics  . Smoking status: Current Some Day Smoker -- 0.10 packs/day for 20 years    Types: Cigarettes  . Smokeless tobacco: Never Used     Comment: Smokes occasionally when someone has a cig to have, doesn't buy them.  . Alcohol Use: No   OB History   Grav Para Term Preterm Abortions TAB SAB Ect Mult Living                 Review of Systems  Constitutional: Positive for activity change.  Respiratory: Negative for shortness of breath.   Cardiovascular: Negative for chest pain.   Gastrointestinal: Negative for nausea, vomiting and abdominal pain.  Genitourinary: Negative for dysuria.  Musculoskeletal: Positive for myalgias. Negative for neck pain.  Skin: Negative for rash and wound.  Neurological: Negative for headaches.      Allergies  Aspirin  Home Medications   Prior to Admission medications   Medication Sig Start Date End Date Taking? Authorizing Provider  atorvastatin (LIPITOR) 40 MG tablet Take 40 mg by mouth daily. 01/10/14  Yes Jolaine Artist, MD  carvedilol (COREG) 6.25 MG tablet Take 9.375-12.5 mg by mouth 2 (two) times daily with a meal. Take 1.5 tablets in the morning Take 2 tablets in the evening   Yes Historical Provider, MD  Diphenhydramine-APAP, sleep, (TYLENOL PM EXTRA STRENGTH PO) Take 4 tablets by mouth at bedtime as needed (sleep).   Yes Historical Provider, MD  ferrous sulfate 325 (65 FE) MG tablet Take 325 mg by mouth daily with breakfast.   Yes Historical Provider, MD  Fluticasone Furoate-Vilanterol (BREO ELLIPTA) 100-25 MCG/INH AEPB Inhale 1 puff into the lungs 3 (three) times daily as needed (wheezing).    Yes Historical Provider, MD  furosemide (LASIX) 40 MG tablet Take 80 mg by mouth 2 (two) times daily. 04/24/14  Yes Amy D [name], NP  lisinopril (ZESTRIL) 2.5 MG tablet Take 1 tablet (2.5 mg total) by mouth  daily. 04/07/14  Yes Rande Brunt, NP  Melatonin 10 MG TABS Take 30 mg by mouth at bedtime.    Yes Historical Provider, MD  spironolactone (ALDACTONE) 25 MG tablet Take 25 mg by mouth every evening. 01/10/14  Yes Jolaine Artist, MD  oxyCODONE-acetaminophen (PERCOCET/ROXICET) 5-325 MG per tablet Take 1 tablet by mouth every 6 (six) hours as needed for moderate pain or severe pain. 07/01/14   Etienne Mowers Kathrynn Humble, MD   BP 98/61  Pulse 56  Temp(Src) 97.5 F (36.4 C) (Oral)  Resp 20  SpO2 100%  LMP 05/02/2014 Physical Exam  Nursing note and vitals reviewed. Constitutional: She is oriented to person, place, and time. She appears  well-developed.  HENT:  Head: Normocephalic and atraumatic.  Eyes: Conjunctivae and EOM are normal. Pupils are equal, round, and reactive to light.  Neck: Normal range of motion. Neck supple.  Cardiovascular: Normal rate, regular rhythm, normal heart sounds and intact distal pulses.   No murmur heard. Pulmonary/Chest: Effort normal and breath sounds normal. No respiratory distress.  Abdominal: Soft. Bowel sounds are normal. She exhibits no distension. There is no tenderness. There is no rebound and no guarding.  Musculoskeletal:  No gross swelling of the right shoulder. There is no increased tenderness with palpation of the right shoulder or the right arm. The extremity is warm to touch. There is no bruit. Pt has 2+ radial and ulnar pulse is also palpable. Skin is warm to touch, cap refill < 3 sec  Neurological: She is alert and oriented to person, place, and time.  Skin: Skin is warm and dry.    ED Course  Procedures (including critical care time) Labs Review Labs Reviewed  CBC WITH DIFFERENTIAL - Abnormal; Notable for the following:    RBC 3.38 (*)    Hemoglobin 10.2 (*)    HCT 32.4 (*)    All other components within normal limits  I-STAT CHEM 8, ED - Abnormal; Notable for the following:    Potassium 3.3 (*)    Creatinine, Ser 1.50 (*)    Glucose, Bld 113 (*)    Calcium, Ion 1.11 (*)    Hemoglobin 11.2 (*)    HCT 33.0 (*)    All other components within normal limits    Imaging Review Ct Angio Chest Pe W/cm &/or Wo Cm  07/01/2014   CLINICAL DATA:  Right shoulder pain. Chest pain. Patient had a recent cardiac catheterization.  EXAM: CT ANGIOGRAPHY CHEST WITH CONTRAST  TECHNIQUE: Multidetector CT imaging of the chest was performed using the standard protocol during bolus administration of intravenous contrast. Multiplanar CT image reconstructions and MIPs were obtained to evaluate the vascular anatomy.  CONTRAST:  191mL OMNIPAQUE IOHEXOL 350 MG/ML SOLN  COMPARISON:  CT chest  08/19/2013  FINDINGS: Satisfactory evaluation of the pulmonary arterial tree. No focal filling defects are identified in the pulmonary arteries to suggest pulmonary embolism.  The heart is enlarged. All 4 chambers appear enlarged, but the right atrium is the most enlarged chamber. Thoracic aorta is normal in caliber. No evidence of aortic dissection.  Negative for pleural or pericardial effusion. Negative for lymphadenopathy.  Injection O contrast was via the right upper extremity. The right subclavian vein is opacified and appears patent, and similar in configuration to the chest CT of October 2014. No fluid collections, focal soft tissue stranding, or acute bony abnormality is seen in the region of the right shoulder.  Lung volumes are slightly low, with scattered areas of atelectasis. There are scattered  ground-glass opacities bilaterally, that are much less prominent than they were in October 2014, suggestive of a mild mosaic attenuation of the lungs.  No acute or suspicious bony abnormality is identified.  Image portion of the upper abdomen shows no acute findings.  Review of the MIP images confirms the above findings.  IMPRESSION: 1. No evidence of pulmonary embolism. 2. Cardiac enlargement. All 4 chambers appear enlarged, but the right atrium is most prominent. 3. No explanation identified for pain in the region of the right shoulder. Improvement in aeration of the lungs compared to the chest CT of 08/19/2013. There is a mild mosaic attenuation of the lungs on today's study, which can be seen with small vessel disease or pulmonary hypertension.   Electronically Signed   By: Curlene Dolphin M.D.   On: 07/01/2014 19:11     EKG Interpretation None      MDM   Final diagnoses:  Right shoulder pain    Pt comes in with cc of shoulder pain. PCP sent her here with severe pain and recent vascular procedure. Pt had right sided cath, and soon after started having pain in her right shoulder, which over the  last week has gotten intense. She also endorses some tingling and cold sensation to her hand. Her neurovascular exam for me is normal. However, cath report does show that she had 2 attempts on the right side, that failed, and that there was some trouble passing the wire. CT A ordered - r/o hematoma, thrombosis, dissection.. Results are normal, so pt advised to see her pcp.    Varney Biles, MD 07/01/14 2106

## 2014-07-01 NOTE — ED Notes (Addendum)
Rt shoulder pain and arm pain since having a test on the 8/14  to see if she had blood clots in her heart ( cardiac cath)  She does  not have them  She cannot sleep and something is wrong. Pt states dr went in thru her wrist it has hurt every since

## 2014-07-01 NOTE — Progress Notes (Signed)
Patient ID: Stacey Lin, female   DOB: February 02, 1966, 48 y.o.   MRN: [id number]   Stacey Lin, is a 48 y.o. female  [id number]  [id number]  DOB - July 21, 1966  Chief Complaint  Patient presents with  . Follow-up  . Hypertension  . Hyperlipidemia  . Shoulder Pain    right         Subjective:   Stacey Lin is a 48 y.o. female here today for a follow up visit. Patient has history of hypertension, hypercholesterolemia, and asthma recently had right heart catheterization, he had today complaining of severe pain on the right hand from the site of insertion of catheter up to the shoulder joint, there is no swelling or erythema. Patient stated the pain has gradually become worse especially with certain position or with movement. She describes some numbness in her fingers. Patient was prescribed Tylenol No. 3 and Motrin but no sustained relief. Patient has No headache, No chest pain, No abdominal pain - No Nausea.  Problem  Non-Articular Hand Pain    ALLERGIES: Allergies  Allergen Reactions  . Aspirin Other (See Comments)    Cause abdominal discomfort    PAST MEDICAL HISTORY: Past Medical History  Diagnosis Date  . Hypertension   . Anemia   . Arrhythmia   . Hypercholesteremia   . Chronic headaches   . Asthma     MEDICATIONS AT HOME: Prior to Admission medications   Medication Sig Start Date End Date Taking? Authorizing Provider  atorvastatin (LIPITOR) 40 MG tablet Take 1 tablet (40 mg total) by mouth daily at 6 PM. 01/10/14  Yes Dolores Patty, MD  carvedilol (COREG) 6.25 MG tablet Take 9.375-12.5 mg by mouth 2 (two) times daily with a meal. Take 1.5 tablets in the morning Take 2 tablets in the evening   Yes Historical Provider, MD  Diphenhydramine-APAP, sleep, (TYLENOL PM EXTRA STRENGTH PO) Take 4 tablets by mouth at bedtime as needed (sleep).   Yes Historical Provider, MD  ferrous sulfate 325 (65 FE) MG tablet Take 325 mg by mouth  daily with breakfast.   Yes Historical Provider, MD  Fluticasone Furoate-Vilanterol (BREO ELLIPTA) 100-25 MCG/INH AEPB Inhale 1 puff into the lungs 3 (three) times daily as needed (wheezing).    Yes Historical Provider, MD  furosemide (LASIX) 40 MG tablet Take 2 tablets (80 mg total) by mouth 2 (two) times daily. 04/24/14  Yes Amy D [name], NP  lisinopril (ZESTRIL) 2.5 MG tablet Take 1 tablet (2.5 mg total) by mouth daily. 04/07/14  Yes Aundria Rud, NP  Melatonin 10 MG TABS Take 30 mg by mouth at bedtime.    Yes Historical Provider, MD  spironolactone (ALDACTONE) 25 MG tablet Take 1 tablet (25 mg total) by mouth every evening. 01/10/14  Yes Dolores Patty, MD     Objective:   Filed Vitals:   07/01/14 0948  BP: 100/68  Pulse: 64  Temp: 97.8 F (36.6 C)  TempSrc: Oral  Resp: 16  Height: 5' (1.524 m)  Weight: 189 lb (85.73 kg)  SpO2: 99%    Exam General appearance : Awake, alert, not in any distress. Speech Clear. Not toxic looking HEENT: Atraumatic and Normocephalic, pupils equally reactive to light and accomodation Neck: supple, no JVD. No cervical lymphadenopathy.  Chest:Good air entry bilaterally, no added sounds  CVS: S1 S2 regular, no murmurs.  Abdomen: Bowel sounds present, Non tender and not distended with no gaurding, rigidity or rebound. Extremities: B/L Lower Ext shows no edema, both legs  are warm to touch Neurology: Awake alert, and oriented X 3, CN II-XII intact, Non focal  Data Review Lab Results  Component Value Date   HGBA1C 5.7* 08/20/2013     Assessment & Plan   1. Chronic systolic CHF (congestive heart failure)  Recent heart cath showed improvement  Continue current medicatioins Follow up with cardiologist  2. Non-articular hand pain, right  - ketorolac (TORADOL) 30 MG/ML injection 30 mg; Inject 1 mL (30 mg total) into the vein once. Due to recent Right Heart Cath and patient experiencing severe pain in the arm and shoulder, patient may need  imaging study to rule out thrombosis as well as manage acute painful distress.  Patient will be referred to urgent care for further testing and management.  Return if symptoms worsen or fail to improve, for Follow up Pain and comorbidities, Heart Failure and Hypertension.  The patient was given clear instructions to go to ER or return to medical center if symptoms don't improve, worsen or new problems develop. The patient verbalized understanding. The patient was told to call to get lab results if they haven't heard anything in the next week.   This note has been created with Education officer, environmental. Any transcriptional errors are unintentional.    Jeanann Lewandowsky, MD, MHA, FACP, FAAP [facility] [place], [place] [phone]   07/01/2014, 10:27 AM

## 2014-07-01 NOTE — ED Notes (Signed)
Pt back from CT

## 2014-07-01 NOTE — Progress Notes (Signed)
Pt being transferred to [facility] via private vehicle in stable condition Report given to triage nurse Sabrina Need ultrasound to r/o Clot

## 2014-07-01 NOTE — Progress Notes (Signed)
Patient comes in for 3 month follow up for HTN, cholesterol, with medication management Patient complains of radiating upper right shoulder pain  following cardiac cath procedure  Patient states otc melatonin supplements are not effective Patient needs pap smear Patient refused flu shot for today

## 2014-07-23 ENCOUNTER — Encounter: Payer: Self-pay | Admitting: Internal Medicine

## 2014-07-23 ENCOUNTER — Ambulatory Visit: Payer: Medicaid Other | Attending: Internal Medicine | Admitting: Internal Medicine

## 2014-07-23 VITALS — BP 107/60 | HR 71 | Temp 98.4°F | Resp 16 | Ht 60.0 in | Wt 189.0 lb

## 2014-07-23 DIAGNOSIS — I5042 Chronic combined systolic (congestive) and diastolic (congestive) heart failure: Secondary | ICD-10-CM | POA: Diagnosis not present

## 2014-07-23 DIAGNOSIS — I499 Cardiac arrhythmia, unspecified: Secondary | ICD-10-CM | POA: Insufficient documentation

## 2014-07-23 DIAGNOSIS — I509 Heart failure, unspecified: Secondary | ICD-10-CM | POA: Insufficient documentation

## 2014-07-23 DIAGNOSIS — J45909 Unspecified asthma, uncomplicated: Secondary | ICD-10-CM | POA: Diagnosis not present

## 2014-07-23 DIAGNOSIS — R51 Headache: Secondary | ICD-10-CM | POA: Diagnosis not present

## 2014-07-23 DIAGNOSIS — Z23 Encounter for immunization: Secondary | ICD-10-CM | POA: Diagnosis not present

## 2014-07-23 DIAGNOSIS — M25511 Pain in right shoulder: Secondary | ICD-10-CM

## 2014-07-23 DIAGNOSIS — Z8249 Family history of ischemic heart disease and other diseases of the circulatory system: Secondary | ICD-10-CM | POA: Diagnosis not present

## 2014-07-23 DIAGNOSIS — I10 Essential (primary) hypertension: Secondary | ICD-10-CM | POA: Diagnosis not present

## 2014-07-23 DIAGNOSIS — D649 Anemia, unspecified: Secondary | ICD-10-CM | POA: Insufficient documentation

## 2014-07-23 DIAGNOSIS — Z79899 Other long term (current) drug therapy: Secondary | ICD-10-CM | POA: Diagnosis not present

## 2014-07-23 DIAGNOSIS — Z825 Family history of asthma and other chronic lower respiratory diseases: Secondary | ICD-10-CM | POA: Diagnosis not present

## 2014-07-23 DIAGNOSIS — M25519 Pain in unspecified shoulder: Secondary | ICD-10-CM | POA: Insufficient documentation

## 2014-07-23 DIAGNOSIS — F17200 Nicotine dependence, unspecified, uncomplicated: Secondary | ICD-10-CM | POA: Diagnosis not present

## 2014-07-23 DIAGNOSIS — Z888 Allergy status to other drugs, medicaments and biological substances status: Secondary | ICD-10-CM | POA: Insufficient documentation

## 2014-07-23 DIAGNOSIS — G4700 Insomnia, unspecified: Secondary | ICD-10-CM | POA: Diagnosis not present

## 2014-07-23 DIAGNOSIS — E7800 Pure hypercholesterolemia, unspecified: Secondary | ICD-10-CM | POA: Insufficient documentation

## 2014-07-23 MED ORDER — ACETAMINOPHEN-CODEINE #3 300-30 MG PO TABS: 1.0000 | ORAL_TABLET | Freq: Three times a day (TID) | ORAL | Status: DC | PRN

## 2014-07-23 MED ORDER — TRAZODONE HCL 50 MG PO TABS: 25.0000 mg | ORAL_TABLET | Freq: Every evening | ORAL | Status: DC | PRN

## 2014-07-23 MED ORDER — FLUTICASONE FUROATE-VILANTEROL 100-25 MCG/INH IN AEPB: INHALATION_SPRAY | RESPIRATORY_TRACT | Status: DC

## 2014-07-23 NOTE — Progress Notes (Signed)
Patient ID: Stacey Lin, female   DOB: [birth year], 48 y.o.   MRN: [id number]  CC: follow up  HPI:  Patient reports that she is continuing to have right shoulder pain for the past month.  She states the pain is an achy throbbing pain that is constant.  She states that it is worse at night and she is unable to put pressure on that arm.  She states that it hurts worse if she attempts to lay on it at night.  She denies injury. She notes the pain was present before she had the cardiac cath.    Allergies  Allergen Reactions  . Aspirin Other (See Comments)    Cause abdominal discomfort   Past Medical History  Diagnosis Date  . Hypertension   . Anemia   . Arrhythmia   . Hypercholesteremia   . Chronic headaches   . Asthma    Current Outpatient Prescriptions on File Prior to Visit  Medication Sig Dispense Refill  . atorvastatin (LIPITOR) 40 MG tablet Take 40 mg by mouth daily.      . carvedilol (COREG) 6.25 MG tablet Take 9.375-12.5 mg by mouth 2 (two) times daily with a meal. Take 1.5 tablets in the morning Take 2 tablets in the evening      . ferrous sulfate 325 (65 FE) MG tablet Take 325 mg by mouth daily with breakfast.      . Fluticasone Furoate-Vilanterol (BREO ELLIPTA) 100-25 MCG/INH AEPB Inhale 1 puff into the lungs 3 (three) times daily as needed (wheezing).       . furosemide (LASIX) 40 MG tablet Take 80 mg by mouth 2 (two) times daily.      Marland Kitchen lisinopril (ZESTRIL) 2.5 MG tablet Take 1 tablet (2.5 mg total) by mouth daily.  30 tablet  3  . Melatonin 10 MG TABS Take 30 mg by mouth at bedtime.       Marland Kitchen spironolactone (ALDACTONE) 25 MG tablet Take 25 mg by mouth every evening.      . Diphenhydramine-APAP, sleep, (TYLENOL PM EXTRA STRENGTH PO) Take 4 tablets by mouth at bedtime as needed (sleep).      Marland Kitchen oxyCODONE-acetaminophen (PERCOCET/ROXICET) 5-325 MG per tablet Take 1 tablet by mouth every 6 (six) hours as needed for moderate pain or severe pain.  12 tablet  0   No current  facility-administered medications on file prior to visit.   Family History  Problem Relation Age of Onset  . Hypertension Mother   . Hypertension Father   . Hypertension Maternal Grandmother   . Allergies Mother   . Asthma Mother   . Heart disease Mother    History   Social History  . Marital Status: Legally Separated    Spouse Name: N/A    Number of Children: N/A  . Years of Education: N/A   Occupational History  . Not on file.   Social History Main Topics  . Smoking status: Current Some Day Smoker -- 0.10 packs/day for 20 years    Types: Cigarettes  . Smokeless tobacco: Never Used     Comment: Smokes occasionally when someone has a cig to have, doesn't buy them.  . Alcohol Use: No  . Drug Use: No     Comment: former, stopped smoking crack X3 years ago  . Sexual Activity: Not on file   Other Topics Concern  . Not on file   Social History Narrative  . No narrative on file    Review of Systems: Constitutional: Negative for  fever, chills, diaphoresis, activity change, appetite change and fatigue. HENT: Negative for ear pain, nosebleeds, congestion, facial swelling, rhinorrhea, neck pain, neck stiffness and ear discharge.  Eyes: Negative for pain, discharge, redness, itching and visual disturbance. Respiratory: Negative for cough, choking, chest tightness, shortness of breath, wheezing and stridor.  Cardiovascular: Negative for chest pain, palpitations and leg swelling. Gastrointestinal: Negative for abdominal distention. Genitourinary: Negative for dysuria, urgency, frequency, hematuria, flank pain, decreased urine volume, difficulty urinating and dyspareunia.  Musculoskeletal: Negative for back pain, joint swelling, arthralgias and gait problem. Neurological: Negative for dizziness, tremors, seizures, syncope, facial asymmetry, speech difficulty, weakness, light-headedness, numbness and headaches.  Hematological: Negative for adenopathy. Does not bruise/bleed  easily. Psychiatric/Behavioral: Negative for hallucinations, behavioral problems, confusion, dysphoric mood, decreased concentration and agitation.    Objective:   Filed Vitals:   07/23/14 1147  BP: 107/60  Pulse: 71  Temp: 98.4 F (36.9 C)  Resp: 16    Physical Exam  Constitutional: She is oriented to person, place, and time.  Cardiovascular: Normal rate, regular rhythm and normal heart sounds.   Pulmonary/Chest: Effort normal and breath sounds normal.  Abdominal: Soft. Bowel sounds are normal.  Musculoskeletal: Normal range of motion.  Neurological: She is alert and oriented to person, place, and time.     Lab Results  Component Value Date   WBC 7.8 07/01/2014   HGB 11.2* 07/01/2014   HCT 33.0* 07/01/2014   MCV 95.9 07/01/2014   PLT 250 07/01/2014   Lab Results  Component Value Date   CREATININE 1.50* 07/01/2014   BUN 23 07/01/2014   NA 138 07/01/2014   K 3.3* 07/01/2014   CL 102 07/01/2014   CO2 27 06/06/2014    Lab Results  Component Value Date   HGBA1C 5.7* 08/20/2013   Lipid Panel     Component Value Date/Time   CHOL 143 08/20/2013 0505   TRIG 97 08/20/2013 0505   HDL 40 08/20/2013 0505   CHOLHDL 3.6 08/20/2013 0505   VLDL 19 08/20/2013 0505   LDLCALC 84 08/20/2013 0505       Assessment and plan:   Sybrina was seen today for follow-up.  Diagnoses and associated orders for this visit:  Essential hypertension, benign Continue regimen, BP well controlled   Chronic combined systolic and diastolic congestive heart failure Continue daily weights and DASH diet  Insomnia - traZODone (DESYREL) 50 MG tablet; Take 0.5-1 tablets (25-50 mg total) by mouth at bedtime as needed for sleep.  Right shoulder pain - DG Shoulder Right; Future - acetaminophen-codeine (TYLENOL #3) 300-30 MG per tablet; Take 1 tablet by mouth every 8 (eight) hours as needed for moderate pain.  Need for prophylactic vaccination and inoculation against influenza Received  - Fluticasone  Furoate-Vilanterol 100-25 MCG/INH AEPB; One inhalation once per day    Return in about 3 months (around 10/22/2014).     Holland Commons, NP-C [facility] [phone] 07/27/2014, 9:48 PM

## 2014-07-23 NOTE — Patient Instructions (Signed)

## 2014-07-23 NOTE — Progress Notes (Signed)
Pt is here for a follow up visit. Pt is requesting a check up and medication review.

## 2014-08-08 ENCOUNTER — Telehealth: Payer: Self-pay | Admitting: *Deleted

## 2014-08-08 NOTE — Telephone Encounter (Signed)
Informed pt, last refill on tylenol 3 was on 9/30

## 2014-08-08 NOTE — Telephone Encounter (Signed)
The patient just had a refill on 9/30.  No more refills

## 2014-08-08 NOTE — Telephone Encounter (Signed)
Pt requested Rx Tylenol 3 refill

## 2014-08-27 ENCOUNTER — Other Ambulatory Visit (HOSPITAL_COMMUNITY): Payer: Self-pay | Admitting: Anesthesiology

## 2014-08-27 DIAGNOSIS — I5022 Chronic systolic (congestive) heart failure: Secondary | ICD-10-CM

## 2014-09-10 ENCOUNTER — Encounter (HOSPITAL_COMMUNITY): Payer: Medicaid Other

## 2014-09-11 ENCOUNTER — Encounter (HOSPITAL_COMMUNITY): Payer: Medicaid Other

## 2014-09-22 ENCOUNTER — Inpatient Hospital Stay (HOSPITAL_COMMUNITY): Admission: RE | Admit: 2014-09-22 | Payer: Medicaid Other | Source: Ambulatory Visit

## 2014-09-25 ENCOUNTER — Encounter (HOSPITAL_COMMUNITY): Payer: Self-pay

## 2014-09-25 ENCOUNTER — Ambulatory Visit (HOSPITAL_COMMUNITY)
Admission: RE | Admit: 2014-09-25 | Discharge: 2014-09-25 | Disposition: A | Discharge status: Home / Self Care | Payer: Medicaid Other | Source: Ambulatory Visit | Attending: Internal Medicine | Admitting: Internal Medicine

## 2014-09-25 VITALS — BP 119/70 | HR 84 | Resp 18 | Wt 191.5 lb

## 2014-09-25 DIAGNOSIS — J45909 Unspecified asthma, uncomplicated: Secondary | ICD-10-CM | POA: Diagnosis not present

## 2014-09-25 DIAGNOSIS — E780 Pure hypercholesterolemia: Secondary | ICD-10-CM | POA: Diagnosis not present

## 2014-09-25 DIAGNOSIS — I5042 Chronic combined systolic (congestive) and diastolic (congestive) heart failure: Secondary | ICD-10-CM

## 2014-09-25 DIAGNOSIS — Z720 Tobacco use: Secondary | ICD-10-CM | POA: Diagnosis not present

## 2014-09-25 DIAGNOSIS — Z79899 Other long term (current) drug therapy: Secondary | ICD-10-CM | POA: Insufficient documentation

## 2014-09-25 DIAGNOSIS — I10 Essential (primary) hypertension: Secondary | ICD-10-CM | POA: Diagnosis not present

## 2014-09-25 DIAGNOSIS — D649 Anemia, unspecified: Secondary | ICD-10-CM | POA: Diagnosis not present

## 2014-09-25 NOTE — Progress Notes (Signed)
Patient ID: Stacey Lin, female   DOB: May 19, 1966, 48 y.o.   MRN: [id number]  PCP: [facility] Pulmonologist: Dr. Shelle Iron  HPI: Stacey Lin is a 48 yo female with a history of anemia, combined systolic/diastolic heart failure, HTN, and history of polysubstance abuse. Previous ECHO (04/2010) EF 50-55% with grd 1 DD, LVIDd 47mm, mild to mod MR, RA and LA mildly dilated.   Admitted to the hospital 08/19/13-08/29/13 with A/C systolic HF. Discharge weight 144 lbs.   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 Mod MR and Mod-sev TR Echo (1/15): EF 45%, mild to moderate AI, moderate MR, RV normal size with mildly decreased systolic function.   Follow up for Heart Failure: At last visit was taken for [facility] d/t continued complaints of CP and SOB. Normal coronaries and pressures stable. She is now working some hours at [employer]. Doing well. Denies SOB, orthopnea, PND or CP. DOE walking up hills. On flat ground can walk about 5 blocks with no issues. Taking medications as prescribed. Trying to drink less than 2L a day and follow a low salt diet. Smoked 3 cigarettes in Oct 07, 2023.   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 Mod MR and Mod-sev TR Echo (1/15): EF 45%, mild to moderate AI, moderate MR, RV normal size with mildly decreased systolic function.  Echo (05/2014): EF 55-60%, mild/mod AI, grade I DD and mild MR R/LHC (06/06/14): ectatic coronaries (suspect d/t HTN) no significant arthrosclerosis and normal EF. No significant MR noted  Labs (1/15): K 4.1, creatinine 0.96, hemoglobin 10.1 Labs  (12/04/13) K 4.3 Creatinine 0.97  Labs (4/15) K+4.1, Creatinine 0.88          (6/15) Hgb 10.9, K 3.8, creatinine 1.19  SH: Living in [place] boyfriend. No ETOH or drug use. Smoked 3 cigarettes in all of 07-Oct-2023.   FH: Mother deceased: Dementia, seizures, stomach cancer        Father deceased: HTN  ROS: All systems negative except as listed in HPI, PMH and Problem List.  Past Medical History  Diagnosis Date  . Hypertension   . Anemia   . Arrhythmia   . Hypercholesteremia   . Chronic headaches   . Asthma     Current Outpatient Prescriptions  Medication Sig Dispense Refill  . atorvastatin (LIPITOR) 40 MG tablet Take 40 mg by mouth daily.    . carvedilol (COREG) 6.25 MG tablet Take 9.375-12.5 mg by mouth 2 (two) times daily with a meal. Take 1.5 tablets in the morning Take 2 tablets in the evening    . Diphenhydramine-APAP, sleep, (TYLENOL PM EXTRA STRENGTH PO) Take 4 tablets by mouth at bedtime as needed (sleep).    . ferrous sulfate 325 (65 FE) MG tablet Take 325 mg by mouth daily with breakfast.    . Fluticasone Furoate-Vilanterol (BREO ELLIPTA) 100-25 MCG/INH AEPB Inhale 1 puff into the lungs 3 (three) times daily as needed (wheezing).     . Fluticasone Furoate-Vilanterol 100-25 MCG/INH AEPB One inhalation once per day 60 each 1  . furosemide (LASIX) 40 MG tablet Take 80 mg by mouth 2 (two) times daily.    Marland Kitchen  lisinopril (PRINIVIL,ZESTRIL) 2.5 MG tablet TAKE 1 TABLET BY MOUTH DAILY. 30 tablet 3  . QUEtiapine (SEROQUEL) 200 MG tablet Take 200 mg by mouth at bedtime.    Marland Kitchen spironolactone (ALDACTONE) 25 MG tablet Take 25 mg by mouth every evening.    . traZODone (DESYREL) 50 MG tablet Take 0.5-1 tablets (25-50 mg total) by mouth at bedtime as needed for sleep. 30 tablet 3   No current facility-administered medications for this encounter.   Filed Vitals:   09/25/14 1457  BP: 119/70  Pulse: 84  Resp: 18  Weight: 191 lb 8 oz (86.864 kg)  SpO2: 97%   PHYSICAL EXAM: General:  Well  appearing; No resp difficulty HEENT: normal Neck: supple. JVP flat; Carotids 2+ bilaterally; no bruits. No lymphadenopathy or thryomegaly appreciated. Cor: PMI normal. Regular rate & rhythm. No rubs, gallops. 2/6 HSM apex. Lungs: distant lung sounds Abdomen: soft, tender, nondistended. No hepatosplenomegaly. No bruits or masses. Good bowel sounds. Extremities: no cyanosis, clubbing, rash, edema Neuro: alert & orientedx3, cranial nerves grossly intact. Moves all 4 extremities w/o difficulty. Affect pleasant.  ASSESSMENT & PLAN:  1) Chronic systolic HF: NICM, EF 55-60%, mild/mod AI, grade I DD and mild MR (05/2014) - NYHA II symptoms and volume status stable. Continue current dose of lasix. BMET with Guide-IT - SBP stable, continue current medications.  - Reinforced the need and importance of daily weights, a low sodium diet, and fluid restriction (less than 2 L a day). Instructed to call the HF clinic if weight increases more than 3 lbs overnight or 5 lbs in a week.  2) Dyspnea: Followed by pulmonology. Sleep study showed small numbers of obstructive events which do not meet the AHI criteria for OSA. PFTs pre CPX: FVC- 1.66 (66%), FEV1-1.19 (59%), FEV1/FVC 97% 3) HTN- Controlled on current medications.  4) Nicotine abuse- Discussed the need to quit smoking.  Lungs are limited on CPX. Only smoked 3 cigarettes in November and congratulated her on success and encouraged her to continue to try and quit completely.  5) Anemia- Followed by PCP. Would like to have hysterectomy. Stable from HF perspective for hysterectomy. Continue iron  F/U 4 months Ulla Potash B NP-C 3:03 PM

## 2014-09-25 NOTE — Patient Instructions (Signed)
Doing FANTASTIC!!!!  Continue current medications.  Follow up in 4 months  Call any issues.  Have a wonderful Christmas and New Years.  Do the following things EVERYDAY: 1) Weigh yourself in the morning before breakfast. Write it down and keep it in a log. 2) Take your medicines as prescribed 3) Eat low salt foods-Limit salt (sodium) to 2000 mg per day.  4) Stay as active as you can everyday 5) Limit all fluids for the day to less than 2 liters 6)

## 2014-10-02 ENCOUNTER — Encounter (HOSPITAL_COMMUNITY): Payer: Self-pay | Admitting: Internal Medicine

## 2014-10-21 ENCOUNTER — Encounter: Payer: Self-pay | Admitting: Internal Medicine

## 2014-10-28 ENCOUNTER — Other Ambulatory Visit (HOSPITAL_COMMUNITY): Payer: Self-pay | Admitting: Internal Medicine

## 2014-12-04 ENCOUNTER — Ambulatory Visit (HOSPITAL_COMMUNITY)
Admission: RE | Admit: 2014-12-04 | Discharge: 2014-12-04 | Disposition: A | Discharge status: Home / Self Care | Payer: Medicaid Other | Source: Ambulatory Visit | Attending: Cardiology | Admitting: Cardiology

## 2014-12-04 ENCOUNTER — Encounter (HOSPITAL_COMMUNITY): Payer: Self-pay

## 2014-12-04 VITALS — BP 110/70 | HR 69 | Wt 193.8 lb

## 2014-12-04 DIAGNOSIS — E780 Pure hypercholesterolemia: Secondary | ICD-10-CM | POA: Diagnosis not present

## 2014-12-04 DIAGNOSIS — Z720 Tobacco use: Secondary | ICD-10-CM

## 2014-12-04 DIAGNOSIS — Z79899 Other long term (current) drug therapy: Secondary | ICD-10-CM | POA: Insufficient documentation

## 2014-12-04 DIAGNOSIS — I5042 Chronic combined systolic (congestive) and diastolic (congestive) heart failure: Secondary | ICD-10-CM

## 2014-12-04 DIAGNOSIS — J449 Chronic obstructive pulmonary disease, unspecified: Secondary | ICD-10-CM | POA: Insufficient documentation

## 2014-12-04 DIAGNOSIS — D649 Anemia, unspecified: Secondary | ICD-10-CM | POA: Insufficient documentation

## 2014-12-04 DIAGNOSIS — I5022 Chronic systolic (congestive) heart failure: Secondary | ICD-10-CM | POA: Diagnosis present

## 2014-12-04 DIAGNOSIS — J45909 Unspecified asthma, uncomplicated: Secondary | ICD-10-CM | POA: Diagnosis not present

## 2014-12-04 DIAGNOSIS — F17200 Nicotine dependence, unspecified, uncomplicated: Secondary | ICD-10-CM | POA: Insufficient documentation

## 2014-12-04 DIAGNOSIS — J438 Other emphysema: Secondary | ICD-10-CM

## 2014-12-04 DIAGNOSIS — I10 Essential (primary) hypertension: Secondary | ICD-10-CM | POA: Insufficient documentation

## 2014-12-04 LAB — TSH: TSH: 1.912 u[IU]/mL (ref 0.350–4.500)

## 2014-12-04 LAB — CBC
HEMATOCRIT: 35.1 % — AB (ref 36.0–46.0)
Hemoglobin: 11.4 g/dL — ABNORMAL LOW (ref 12.0–15.0)
MCH: 30.3 pg (ref 26.0–34.0)
MCHC: 32.5 g/dL (ref 30.0–36.0)
MCV: 93.4 fL (ref 78.0–100.0)
Platelets: 359 10*3/uL (ref 150–400)
RBC: 3.76 MIL/uL — AB (ref 3.87–5.11)
RDW: 12.5 % (ref 11.5–15.5)
WBC: 9.8 10*3/uL (ref 4.0–10.5)

## 2014-12-04 MED ORDER — SPIRONOLACTONE 25 MG PO TABS: 25.0000 mg | ORAL_TABLET | Freq: Every evening | ORAL | Status: DC

## 2014-12-04 MED ORDER — TIOTROPIUM BROMIDE MONOHYDRATE 18 MCG IN CAPS: 18.0000 ug | ORAL_CAPSULE | Freq: Every day | RESPIRATORY_TRACT | Status: DC

## 2014-12-04 NOTE — Patient Instructions (Signed)
Follow up in 4 months  Please use Spiriva daily  Do the following things EVERYDAY: 1) Weigh yourself in the morning before breakfast. Write it down and keep it in a log. 2) Take your medicines as prescribed 3) Eat low salt foods-Limit salt (sodium) to 2000 mg per day.  4) Stay as active as you can everyday 5) Limit all fluids for the day to less than 2 liters

## 2014-12-04 NOTE — Progress Notes (Signed)
Patient ID: Stacey Lin, female   DOB: [birth year], 49 y.o.   MRN: [id number]  PCP: [facility] Pulmonologist: Dr. Shelle Iron  HPI: [name] is a 49 yo female with a history of anemia, combined systolic/diastolic heart failure, HTN, and history of polysubstance abuse. Previous ECHO (04/2010) EF 50-55% with grd 1 DD, LVIDd 47mm, mild to mod MR, RA and LA mildly dilated.   Admitted to the hospital 08/19/13-08/29/13 with A/C systolic HF. Discharge weight 144 lbs.  Follow up for Heart Failure:  Overall feels ok. Complains of fatigue. Has had sleep study. Mild dyspnea with exertion. Does not have scale. Taking all medications. Not exercising. Able to walk 1/4 of mile. Drinking > 2 liters. Smoking a few cigarettes a day.   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 Mod MR and Mod-sev TR Echo (1/15): EF 45%, mild to moderate AI, moderate MR, RV normal size with mildly decreased systolic function.  Echo (05/2014): EF 55-60%, mild/mod AI, grade I DD and mild MR R/LHC (06/06/14): ectatic coronaries (suspect d/t HTN) no significant arthrosclerosis and normal EF. No significant MR noted  Labs (1/15): K 4.1, creatinine 0.96, hemoglobin 10.1 Labs (12/04/13) K 4.3 Creatinine 0.97  Labs (4/15) K+4.1, Creatinine 0.88          (6/15) Hgb 10.9, K 3.8, creatinine 1.19  SH: Living in [place] boyfriend. No ETOH or drug use. Smoked 3 cigarettes in all of 09-23-23.   FH: Mother deceased: Dementia, seizures, stomach cancer        Father deceased: HTN  ROS: All systems negative except as listed in HPI, PMH and Problem List.  Past Medical History  Diagnosis Date  . Hypertension   . Anemia   . Arrhythmia   . Hypercholesteremia   . Chronic headaches   . Asthma     Current Outpatient  Prescriptions  Medication Sig Dispense Refill  . atorvastatin (LIPITOR) 40 MG tablet Take 40 mg by mouth daily.    . carvedilol (COREG) 6.25 MG tablet TAKE 1 & 1/2 TABLETS BY MOUTH IN THE MORNING AND 2 TABLETS IN THE EVENING 105 tablet 3  . Diphenhydramine-APAP, sleep, (TYLENOL PM EXTRA STRENGTH PO) Take 4 tablets by mouth at bedtime as needed (sleep).    . ferrous sulfate 325 (65 FE) MG tablet Take 325 mg by mouth daily with breakfast.    . Fluticasone Furoate-Vilanterol (BREO ELLIPTA) 100-25 MCG/INH AEPB Inhale 1 puff into the lungs 3 (three) times daily as needed (wheezing).     . Fluticasone Furoate-Vilanterol 100-25 MCG/INH AEPB One inhalation once per day 60 each 1  . furosemide (LASIX) 40 MG tablet Take 80 mg by mouth 2 (two) times daily.    Marland Kitchen lisinopril (PRINIVIL,ZESTRIL) 2.5 MG tablet TAKE 1 TABLET BY MOUTH DAILY. 30 tablet 3  . spironolactone (ALDACTONE) 25 MG tablet Take 25 mg by mouth every evening.     No current facility-administered medications for this encounter.   Filed Vitals:   12/04/14 1151  BP: 86/50  Pulse: 69  Weight: 193 lb 12.8 oz (87.907 kg)  SpO2: 98%   PHYSICAL EXAM: General:  Well appearing; No resp difficulty HEENT: normal Neck: supple. JVP flat; Carotids 2+ bilaterally; no bruits. No lymphadenopathy or thryomegaly appreciated. Cor: PMI normal. Regular rate & rhythm. No rubs, gallops. 2/6  HSM apex. Lungs: distant lung sounds Abdomen: soft, tender, nondistended. No hepatosplenomegaly. No bruits or masses. Good bowel sounds. Extremities: no cyanosis, clubbing, rash, edema Neuro: alert & orientedx3, cranial nerves grossly intact. Moves all 4 extremities w/o difficulty. Affect pleasant.  ASSESSMENT & PLAN:  1) Chronic systolic HF: NICM, EF 55-60%, mild/mod AI, grade I DD and mild MR (05/2014) - NYHA II symptoms and volume status stable. Continue current dose of lasix. BMET with Guide-IT - SBP stable, continue current medications.  - Reinforced the need and  importance of daily weights, a low sodium diet, and fluid restriction (less than 2 L a day). Instructed to call the HF clinic if weight increases more than 3 lbs overnight or 5 lbs in a week.  2) Dyspnea: Followed by pulmonology. Sleep study showed small numbers of obstructive events which do not meet the AHI criteria for OSA. PFTs pre CPX: FVC- 1.66 (66%), FEV1-1.19 (59%), FEV1/FVC 97%. Add spiriva 3) HTN- Controlled on current medications.  4) Nicotine abuse- Discussed the need to quit smoking.  Lungs are limited on CPX. Encouraged her to continue to try and quit completely (she only smokes 1-2 cigs/day).  5) Anemia- Followed by PCP. Would like to have hysterectomy. Stable from HF perspective for hysterectomy. Continue iron  [name],[name] 11:59 AM   Patient seen with NP, agree with the above note.  She has stable dyspnea with moderate exertion and some general fatigue.  EF has improved to normal.  PFTs suggest significant COPD.  I think that most of the current dyspnea is likely attributable to COPD.   - Start Spiriva - Refer to pulmonary for evaluation.  - Check TSH, CBC, BNP, and BMET today.   [name] 12/04/2014

## 2014-12-08 ENCOUNTER — Other Ambulatory Visit (HOSPITAL_COMMUNITY): Payer: Self-pay | Admitting: *Deleted

## 2014-12-08 MED ORDER — SPIRONOLACTONE 25 MG PO TABS: 25.0000 mg | ORAL_TABLET | Freq: Every evening | ORAL | Status: DC

## 2014-12-18 ENCOUNTER — Ambulatory Visit (INDEPENDENT_AMBULATORY_CARE_PROVIDER_SITE_OTHER): Payer: Medicaid Other | Admitting: Pulmonary Disease

## 2014-12-18 ENCOUNTER — Encounter: Payer: Self-pay | Admitting: Pulmonary Disease

## 2014-12-18 VITALS — BP 118/76 | HR 67 | Temp 96.7°F | Ht 60.0 in | Wt 193.4 lb

## 2014-12-18 DIAGNOSIS — J4530 Mild persistent asthma, uncomplicated: Secondary | ICD-10-CM

## 2014-12-18 DIAGNOSIS — R0600 Dyspnea, unspecified: Secondary | ICD-10-CM

## 2014-12-18 MED ORDER — FLUTICASONE FUROATE-VILANTEROL 100-25 MCG/INH IN AEPB: INHALATION_SPRAY | RESPIRATORY_TRACT | Status: DC

## 2014-12-18 MED ORDER — ALBUTEROL SULFATE 108 (90 BASE) MCG/ACT IN AEPB: 2.0000 | INHALATION_SPRAY | Freq: Four times a day (QID) | RESPIRATORY_TRACT | Status: DC | PRN

## 2014-12-18 NOTE — Assessment & Plan Note (Signed)
The patient is having worsening dyspnea on exertion, but her spirometry today shows a normal FEV1 percent and very little change in her FEV1 from almost a year ago. The patient does not have COPD by spirometry, but she probably does have some air trapping given her FVC which is also reduced from centripetal obesity. She is continuing to smoke, and I have told her this is driving airway inflammation. I have asked her to work on totally quitting, and will put her back on Breo to see if that will help with airway inflammation in the short-term. I do not think she needs any inhaled medications if she is able to stop smoking. I really think that her weight and deconditioning are much more of an issue for her shortness of breath than her minimal airways disease. She is also having a lot of dry and tickling cough, and this may simply be due to her ACE inhibitor.

## 2014-12-18 NOTE — Patient Instructions (Addendum)
Stop spiriva Will start back on breo 100, take one inhalation each am everyday no matter what.  Rinse mouth well after using. proir respiclick, 2 inhalations every 6 hrs only if you are having severe breathing issues. Work on weight loss and conditioning. Will send a note to your cardiologist to see about getting you off lisinopril to see if this helps your dry tickling cough. You must stop smoking 100% in order to stay well.  followup with me again in 37mos to check on things.

## 2014-12-18 NOTE — Progress Notes (Signed)
Subjective:    Patient ID: Stacey Lin, female    DOB: February 06, 1966, 49 y.o.   MRN: [id number]  HPI The patient comes in today for follow-up of her known dyspnea on exertion. I have not seen her in almost a year, where she was felt to have asthmatic bronchitis but not COPD. I did start her on Breo for airway inflammation and asked her to work on smoking cessation. She has noticed increasing shortness of breath over the last year, and currently is interfering with her quality of life. She is currently on Spiriva, but does not feel that it helped is much as Breo. Unfortunately, she continues to smoke. She is also having an upper airway cough that is dry and tickling in nature, and it is noted that she is on an ACE inhibitor.   Review of Systems  Constitutional: Negative for fever and unexpected weight change.  HENT: Positive for congestion and postnasal drip. Negative for dental problem, ear pain, nosebleeds, rhinorrhea, sinus pressure, sneezing, sore throat and trouble swallowing.   Eyes: Negative for redness and itching.  Respiratory: Positive for cough, chest tightness, shortness of breath and wheezing.   Cardiovascular: Negative for palpitations and leg swelling.  Gastrointestinal: Negative for nausea and vomiting.  Genitourinary: Negative for dysuria.  Musculoskeletal: Negative for joint swelling.  Skin: Negative for rash.  Neurological: Negative for headaches.  Hematological: Does not bruise/bleed easily.  Psychiatric/Behavioral: Negative for dysphoric mood. The patient is not nervous/anxious.        Objective:   Physical Exam Obese female in no acute distress Nose without purulence or discharge noted Neck without lymphadenopathy or thyromegaly Chest totally clear to auscultation, no wheezing Cardiac exam with regular rate and rhythm Lower extremities with mild edema, no cyanosis Alert and oriented, moves all 4 extremities.       Assessment & Lin:

## 2014-12-21 NOTE — Progress Notes (Signed)
That would be fine.  We can stop lisinopril and replace with losartan 12.5 mg daily.

## 2014-12-22 ENCOUNTER — Encounter: Payer: Self-pay | Admitting: Internal Medicine

## 2014-12-22 ENCOUNTER — Telehealth: Payer: Self-pay | Admitting: Pulmonary Disease

## 2014-12-22 NOTE — Telephone Encounter (Signed)
Called and spoke to Pharmacy at [employer].  They will not pay for ProAir Respiclick, but will approve ProAir inhaler or Proventil Inhaler.  We can change patient from Respiclick to ProAir inhaler and will not have to do prior authorization for this.  Patient also has PA request for [facility].  Spoke with pharmacy, they said that prior authorization is required for all inhaled corticosteroids.    The only exception is Budesonide inhalation suspension 0.25mg  and 0.5mg  - this does not require authorization.  Form printed for Breo and placed in [facility] box for Mindy to follow up on.    [place], Please advise if you would like to proceed with PA.

## 2014-12-23 MED ORDER — ALBUTEROL SULFATE HFA 108 (90 BASE) MCG/ACT IN AERS: 2.0000 | INHALATION_SPRAY | Freq: Four times a day (QID) | RESPIRATORY_TRACT | Status: DC | PRN

## 2014-12-23 NOTE — Telephone Encounter (Signed)
PA form has been faxed back and will await approval

## 2014-12-23 NOTE — Telephone Encounter (Signed)
Form is in Stacey Lin's green folder. Please advise thanks

## 2014-12-23 NOTE — Telephone Encounter (Signed)
Ok to change to regular proair inhaler Will fill out form for breo once I receive it.

## 2014-12-23 NOTE — Telephone Encounter (Signed)
Rx for ProAir has been sent in per [facility]. Will route message back to Mindy to follow up on form for Breo.

## 2014-12-31 ENCOUNTER — Other Ambulatory Visit (HOSPITAL_COMMUNITY): Payer: Self-pay | Admitting: Cardiology

## 2014-12-31 NOTE — Telephone Encounter (Signed)
Called [phone] to obtain results from PA.  Per [facility], they did not receive fax.  Did PA over the phone.  They said to call back tomorrow to obtain results:  [phone] pharmacy dept. Reference number: [id number]

## 2015-01-01 ENCOUNTER — Telehealth (HOSPITAL_COMMUNITY): Payer: Self-pay | Admitting: Vascular Surgery

## 2015-01-01 NOTE — Telephone Encounter (Signed)
Pt has no more refill for Lisinpril

## 2015-01-02 NOTE — Telephone Encounter (Signed)
Will forward to Leigh to help f/u on since she is PA's this AM

## 2015-01-02 NOTE — Telephone Encounter (Addendum)
Called the number listed below and the proair is a preferred medication and did not need PA. [facility] has been approved from 12-31-14 through 12-26-2015.   Pharmacy is aware and nothing further is needed.

## 2015-02-05 ENCOUNTER — Other Ambulatory Visit (HOSPITAL_COMMUNITY): Payer: Self-pay | Admitting: Internal Medicine

## 2015-02-16 ENCOUNTER — Ambulatory Visit: Payer: Medicaid Other | Admitting: Pulmonary Disease

## 2015-03-02 IMAGING — CT CT ANGIO CHEST
2 of 9 series · 18 of 46 positions shown · IV contrast (OMNI)
Comparison: CT chest 08/19/2013

CLINICAL DATA: Right shoulder pain. Chest pain. Patient had a
recent cardiac catheterization.

EXAM:
CT ANGIOGRAPHY CHEST WITH CONTRAST
TECHNIQUE: Multidetector CT imaging of the chest was performed using the
standard protocol during bolus administration of intravenous
contrast. Multiplanar CT image reconstructions and MIPs were
obtained to evaluate the vascular anatomy.
CONTRAST:  100mL OMNIPAQUE IOHEXOL 350 MG/ML SOLN

[Series 5: thins · axial · 0.59mm/px · z∈[+1225,+1450]mm · 15 of 255 slices shown]
[im 15/255  lung]
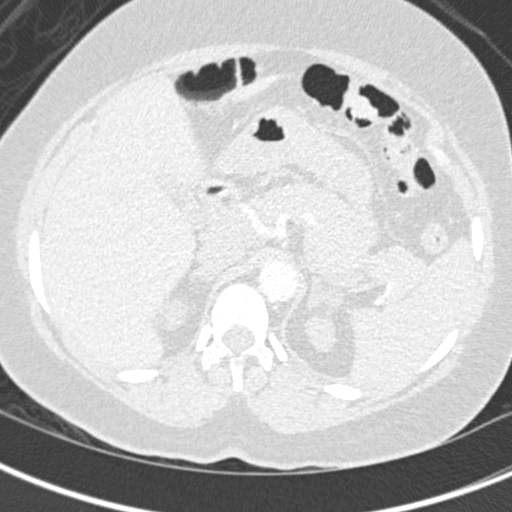
[im 29/255  soft-tissue]
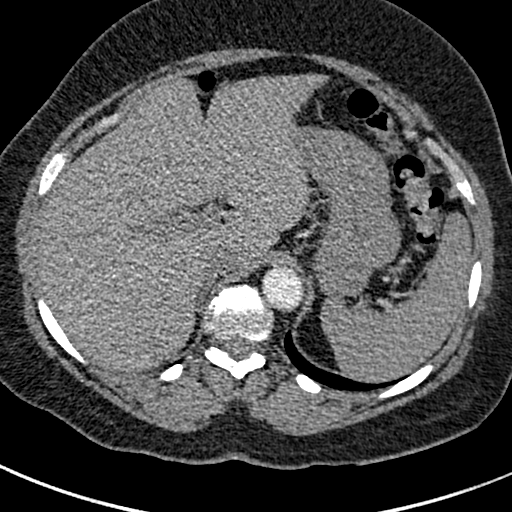
[im 43/255  lung]
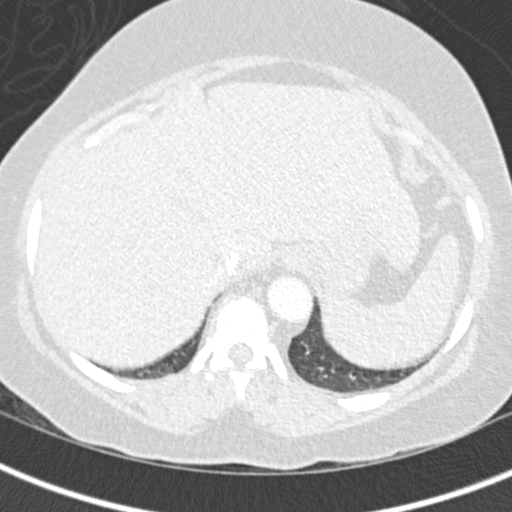
[im 57/255  soft-tissue]
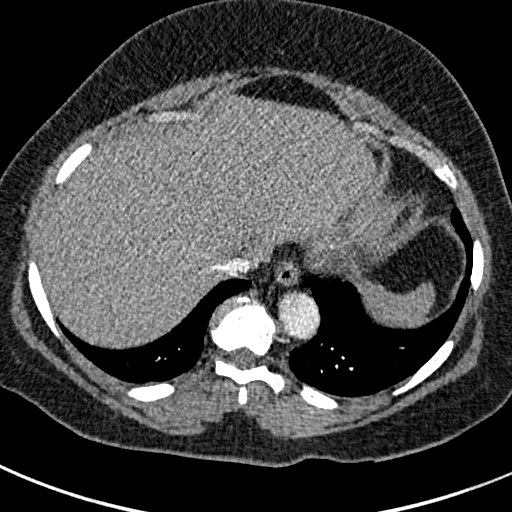
[im 85/255  lung]
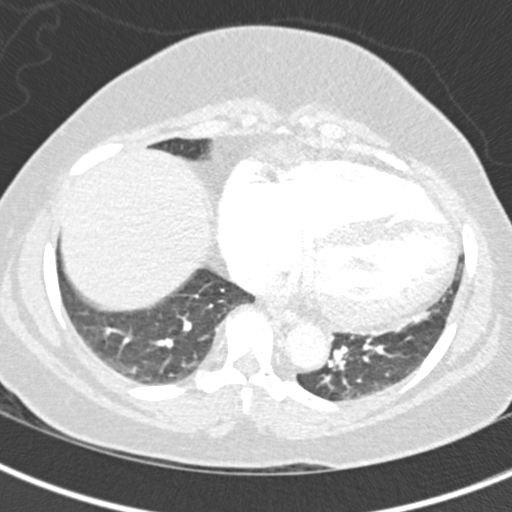
[im 99/255  soft-tissue]
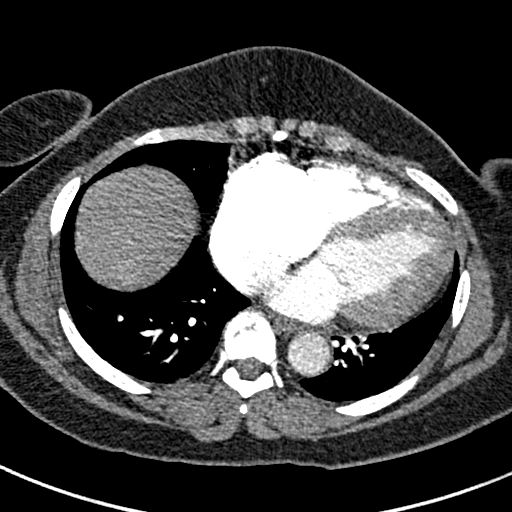
[im 113/255  lung]
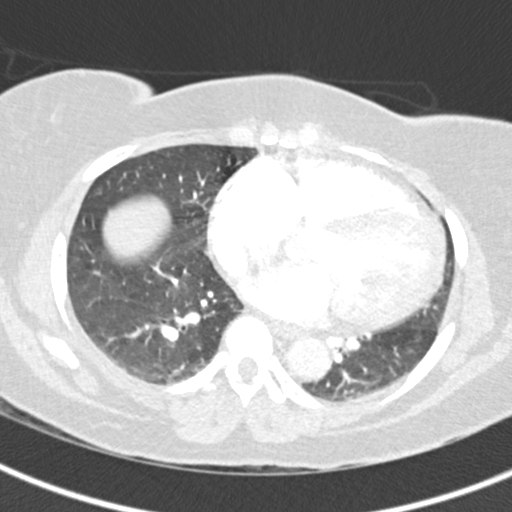
[im 128/255  soft-tissue]
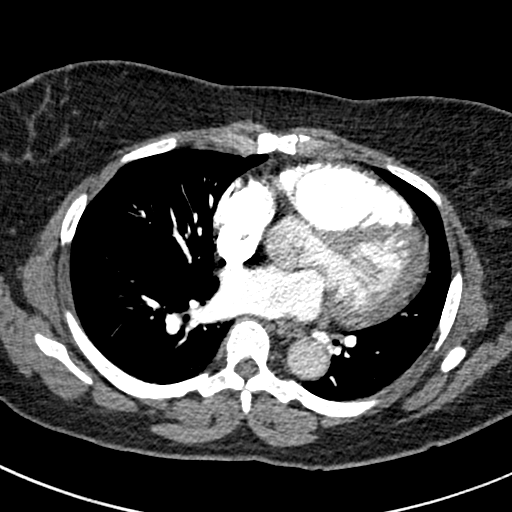
[im 142/255  lung]
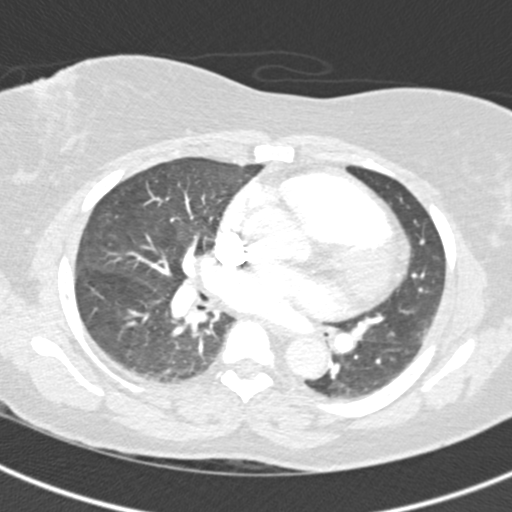
[im 156/255  soft-tissue]
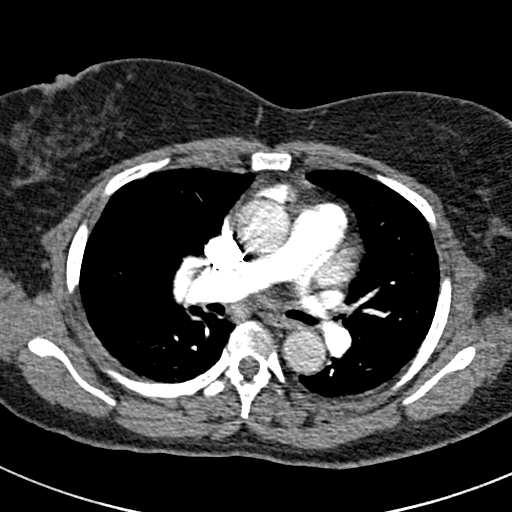
[im 170/255  lung]
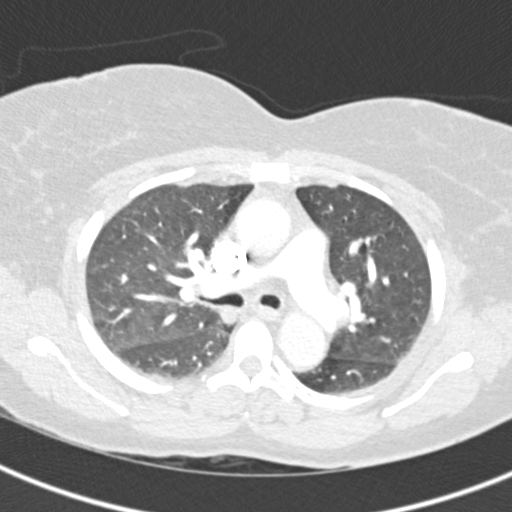
[im 198/255  soft-tissue]
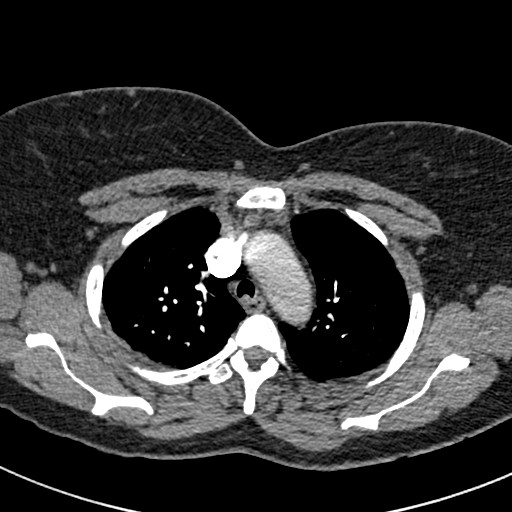
[im 212/255  lung]
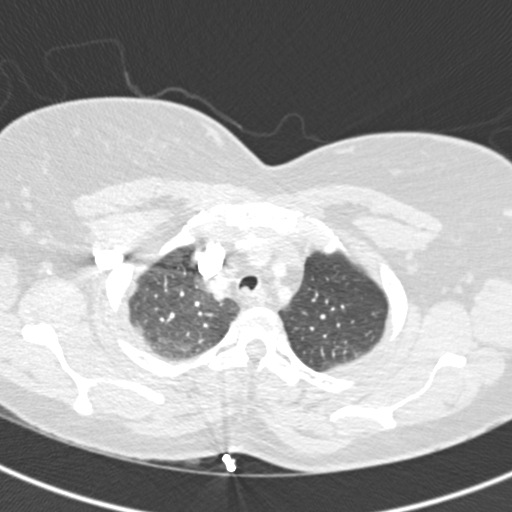
[im 226/255  soft-tissue]
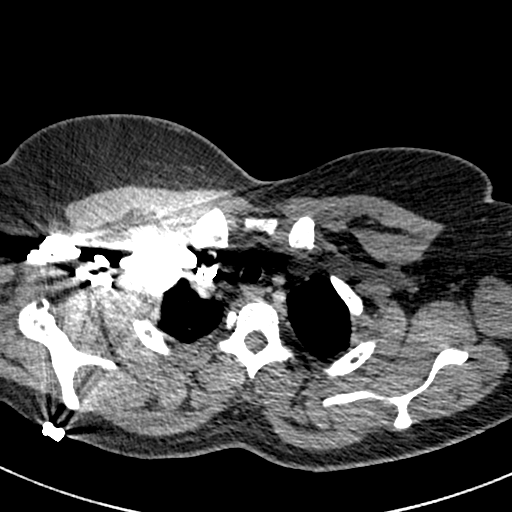
[im 240/255  lung]
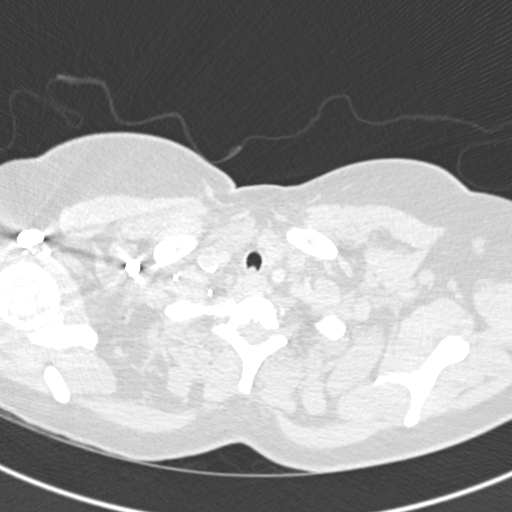

[Series 7: coronal mpr · coronal · 0.51mm/px · 3 of 151 slices shown]
[im 38/151  soft-tissue]
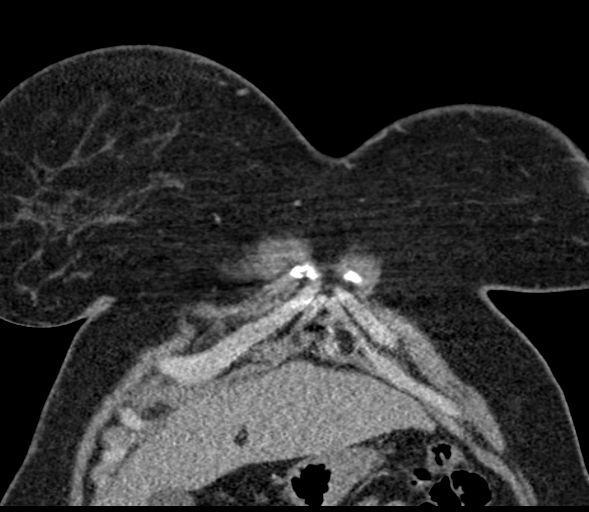
[im 76/151  soft-tissue]
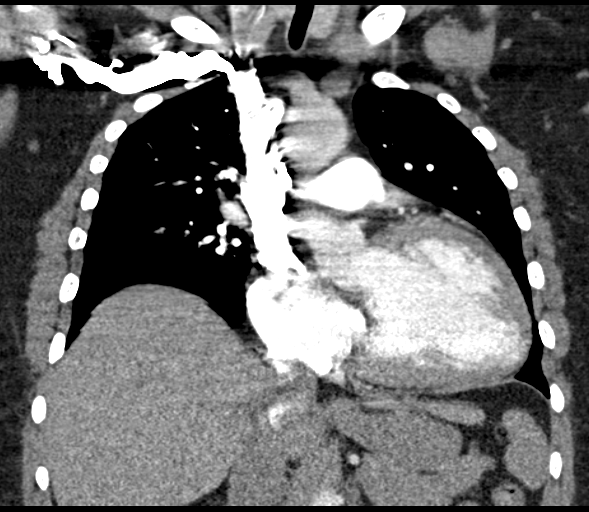
[im 113/151  soft-tissue]
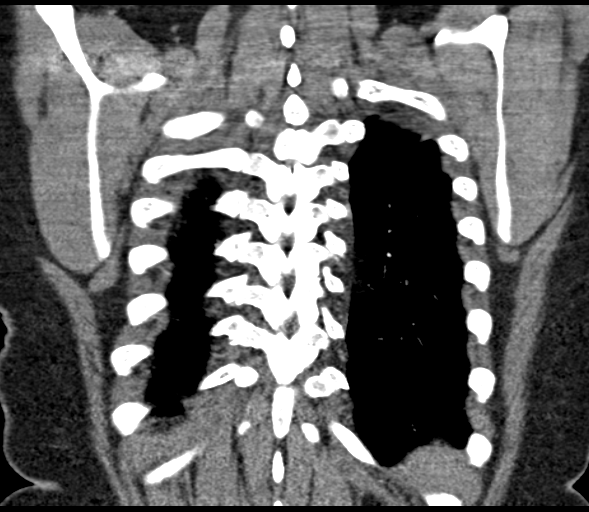

[18 of 46 positions shown; findings below may reference images not displayed]

FINDINGS: Satisfactory evaluation of the pulmonary arterial tree. No focal
filling defects are identified in the pulmonary arteries to suggest
pulmonary embolism.

The heart is enlarged. All 4 chambers appear enlarged, but the right
atrium is the most enlarged chamber. Thoracic aorta is normal in
caliber. No evidence of aortic dissection.

Negative for pleural or pericardial effusion. Negative for
lymphadenopathy.

Injection O contrast was via the right upper extremity. The right
subclavian vein is opacified and appears patent, and similar in
configuration to the chest CT July 2013. No fluid collections,
focal soft tissue stranding, or acute bony abnormality is seen in
the region of the right shoulder.

Lung volumes are slightly low, with scattered areas of atelectasis.
There are scattered ground-glass opacities bilaterally, that are
much less prominent than they were in July 2013, suggestive of a
mild mosaic attenuation of the lungs.

No acute or suspicious bony abnormality is identified.

Image portion of the upper abdomen shows no acute findings.

Review of the MIP images confirms the above findings.
IMPRESSION: 1. No evidence of pulmonary embolism.
2. Cardiac enlargement. All 4 chambers appear enlarged, but the
right atrium is most prominent.
3. No explanation identified for pain in the region of the right
shoulder. Improvement in aeration of the lungs compared to the chest
CT of 08/19/2013. There is a mild mosaic attenuation of the lungs on
today's study, which can be seen with small vessel disease or
pulmonary hypertension.

## 2015-03-05 ENCOUNTER — Telehealth (HOSPITAL_COMMUNITY): Payer: Self-pay | Admitting: Vascular Surgery

## 2015-03-05 ENCOUNTER — Other Ambulatory Visit (HOSPITAL_COMMUNITY): Payer: Self-pay | Admitting: Internal Medicine

## 2015-03-05 MED ORDER — FUROSEMIDE 40 MG PO TABS: 80.0000 mg | ORAL_TABLET | Freq: Two times a day (BID) | ORAL | Status: DC

## 2015-03-05 NOTE — Telephone Encounter (Signed)
PT need refill auth for Carvedilol and Furosemide

## 2015-03-06 ENCOUNTER — Telehealth (HOSPITAL_COMMUNITY): Payer: Self-pay | Admitting: *Deleted

## 2015-03-06 NOTE — Telephone Encounter (Signed)
Pt was in for an appt with research yesterday and had labs, results show K 3.3 labs reviewed by Dr Haroldine Laws, he recommends pt start KCL 20 meq daily, taking 40 meq first day.  I called and spoke w/pt she is aware but states she can't her the med until Tue when she comes for her appt due to transportation.  She denies cramping or any other symptoms at this time and states she will keep appt as sch Tue 5/17, will give her KCL at that time

## 2015-03-10 ENCOUNTER — Other Ambulatory Visit (HOSPITAL_COMMUNITY): Payer: Self-pay | Admitting: *Deleted

## 2015-03-10 ENCOUNTER — Encounter (HOSPITAL_COMMUNITY): Payer: Medicaid Other

## 2015-03-10 MED ORDER — POTASSIUM CHLORIDE ER 20 MEQ PO TBCR: 20.0000 meq | EXTENDED_RELEASE_TABLET | Freq: Every day | ORAL | Status: DC

## 2015-04-03 ENCOUNTER — Encounter: Payer: Self-pay | Admitting: Internal Medicine

## 2015-04-14 ENCOUNTER — Other Ambulatory Visit (HOSPITAL_COMMUNITY): Payer: Self-pay | Admitting: Internal Medicine

## 2015-05-07 ENCOUNTER — Encounter (HOSPITAL_COMMUNITY): Payer: Self-pay

## 2015-05-07 ENCOUNTER — Other Ambulatory Visit (HOSPITAL_COMMUNITY): Payer: Self-pay

## 2015-05-07 ENCOUNTER — Ambulatory Visit (HOSPITAL_COMMUNITY)
Admission: RE | Admit: 2015-05-07 | Discharge: 2015-05-07 | Disposition: A | Discharge status: Home / Self Care | Payer: Medicaid Other | Source: Ambulatory Visit | Attending: Cardiology | Admitting: Cardiology

## 2015-05-07 VITALS — BP 110/70 | HR 71 | Wt 190.8 lb

## 2015-05-07 DIAGNOSIS — I5022 Chronic systolic (congestive) heart failure: Secondary | ICD-10-CM | POA: Diagnosis not present

## 2015-05-07 DIAGNOSIS — I5032 Chronic diastolic (congestive) heart failure: Secondary | ICD-10-CM | POA: Diagnosis not present

## 2015-05-07 DIAGNOSIS — Z79899 Other long term (current) drug therapy: Secondary | ICD-10-CM | POA: Insufficient documentation

## 2015-05-07 DIAGNOSIS — I509 Heart failure, unspecified: Secondary | ICD-10-CM | POA: Diagnosis not present

## 2015-05-07 DIAGNOSIS — Z8249 Family history of ischemic heart disease and other diseases of the circulatory system: Secondary | ICD-10-CM | POA: Diagnosis not present

## 2015-05-07 DIAGNOSIS — F17210 Nicotine dependence, cigarettes, uncomplicated: Secondary | ICD-10-CM | POA: Diagnosis not present

## 2015-05-07 DIAGNOSIS — D649 Anemia, unspecified: Secondary | ICD-10-CM | POA: Diagnosis not present

## 2015-05-07 DIAGNOSIS — J45909 Unspecified asthma, uncomplicated: Secondary | ICD-10-CM | POA: Diagnosis not present

## 2015-05-07 DIAGNOSIS — Z720 Tobacco use: Secondary | ICD-10-CM | POA: Diagnosis not present

## 2015-05-07 DIAGNOSIS — E780 Pure hypercholesterolemia: Secondary | ICD-10-CM | POA: Insufficient documentation

## 2015-05-07 DIAGNOSIS — I10 Essential (primary) hypertension: Secondary | ICD-10-CM | POA: Diagnosis not present

## 2015-05-07 DIAGNOSIS — I429 Cardiomyopathy, unspecified: Secondary | ICD-10-CM | POA: Diagnosis not present

## 2015-05-07 DIAGNOSIS — E669 Obesity, unspecified: Secondary | ICD-10-CM | POA: Insufficient documentation

## 2015-05-07 DIAGNOSIS — F17200 Nicotine dependence, unspecified, uncomplicated: Secondary | ICD-10-CM

## 2015-05-07 DIAGNOSIS — I5042 Chronic combined systolic (congestive) and diastolic (congestive) heart failure: Secondary | ICD-10-CM | POA: Diagnosis present

## 2015-05-07 MED ORDER — NICOTINE 7 MG/24HR TD PT24: 7.0000 mg | MEDICATED_PATCH | Freq: Every day | TRANSDERMAL | Status: DC

## 2015-05-07 MED ORDER — FLUTICASONE FUROATE-VILANTEROL 100-25 MCG/INH IN AEPB: INHALATION_SPRAY | RESPIRATORY_TRACT | Status: DC

## 2015-05-07 MED ORDER — LOSARTAN POTASSIUM 25 MG PO TABS: 12.5000 mg | ORAL_TABLET | Freq: Every day | ORAL | Status: DC

## 2015-05-07 NOTE — Patient Instructions (Addendum)
STOP Lisinopril.  START Losartan 12.5 mg (1/2 tablet) once daily.  START Nicotine patch once daily.  START Potassium 20 meq (1 tablet) once daily.  Prescriptions sent to [facility].  Please contact Dr. Janifer Adie office to set up appointment. [phone]  Will schedule you for next available echocardiogram.  Follow up 4 months.  Do the following things EVERYDAY: 1) Weigh yourself in the morning before breakfast. Write it down and keep it in a log. 2) Take your medicines as prescribed 3) Eat low salt foods-Limit salt (sodium) to 2000 mg per day.  4) Stay as active as you can everyday 5) Limit all fluids for the day to less than 2 liters

## 2015-05-07 NOTE — Progress Notes (Signed)
Patient ID: Stacey Lin, female   DOB: 1966/08/24, 49 y.o.   MRN: [id number]  PCP: [facility] Pulmonologist: Dr. Shelle Iron  HPI: Stacey Lin is a 49 yo female with a history of anemia, combined systolic/diastolic heart failure, HTN, and history of polysubstance abuse. Initial ECHO (04/2010) EF 50-55% with grd 1 DD, LVIDd 47mm, mild to mod MR, RA and LA mildly dilated.   Admitted to the hospital 08/19/13-08/29/13 with A/C systolic HF. Discharge weight 144 lbs.  She presents today for HF follow up.  At her last visit we added spiriva. Has since been changed to [facility], but has run out and breathing has become "tight". Still has not picked up potassium. Taking all other meds. Not exercising, but interested in getting into an exercise program. Ok on flat ground for up to about 1/4 of mile. Has DOE with hills or steps. Has 2 pillow orthopnea. Can only do a bike for 1-2 minutes. Drinking > 2 liters with it so hot, has tried to cut back on salt but still eating it. Still smoking 2-3 cigarettes a day, some days doesn't smoke at all. Weighs herself every morning 189-190 at home. Does have occasional CP. Not related to exertion, is better when she rubs her chest.  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 Mod MR and Mod-sev TR Echo (1/15): EF 45%, mild to moderate AI, moderate MR, RV normal size with mildly decreased systolic function.  CPX (5/15): FVC 66%, FEV1 59%, ratio 72%, peak VO2 15.7, slope 22.6 => mildly decreased functional capacity limited primarily by lungs.  Echo (05/2014): EF 55-60%, mild/mod AI, grade I DD and mild MR R/LHC (06/06/14): ectatic coronaries (suspect d/t HTN) no significant arthrosclerosis and normal EF. No significant MR noted  Labs (1/15): K 4.1, creatinine  0.96, hemoglobin 10.1 Labs (12/04/13) K 4.3 Creatinine 0.97 Labs (4/15) K+4.1, Creatinine 0.88 Labs (6/15) Hgb 10.9, K 3.8, creatinine 1.19 Labs (2/16) HCT 35.1, TSH normal Labs (5/16) K 3.3, creatinine 1.38  ECG: NSR, anterolateral TW inversions  SH: Living in [place] with boyfriend. No ETOH or drug use. Smoked 3 cigarettes in all of 09-29-2023.   FH: Mother deceased: Dementia, seizures, stomach cancer        Father deceased: HTN  ROS: All systems negative except as listed in HPI, PMH and Problem List.  Past Medical History  Diagnosis Date  . Hypertension   . Anemia   . Arrhythmia   . Hypercholesteremia   . Chronic headaches   . Asthma     Current Outpatient Prescriptions  Medication Sig Dispense Refill  . albuterol (PROAIR HFA) 108 (90 BASE) MCG/ACT inhaler Inhale 2 puffs into the lungs every 6 (six) hours as needed for wheezing or shortness of breath. 1 Inhaler 5  . atorvastatin (LIPITOR) 40 MG tablet Take 40 mg by mouth daily.    . carvedilol (COREG) 6.25 MG tablet TAKE 1 & 1/2 TABLETS BY MOUTH IN THE MORNING AND 2 TABLETS IN THE EVENING 105 tablet 3  . Diphenhydramine-APAP, sleep, (TYLENOL PM EXTRA STRENGTH PO) Take 4 tablets by mouth at bedtime as needed (sleep).    . ferrous sulfate 325 (65 FE) MG tablet Take 325 mg by mouth daily with breakfast.    . Fluticasone Furoate-Vilanterol 100-25 MCG/INH AEPB 1 puff once daily 60  each 2  . furosemide (LASIX) 40 MG tablet Take 2 tablets (80 mg total) by mouth 2 (two) times daily. 120 tablet 3  . lisinopril (PRINIVIL,ZESTRIL) 2.5 MG tablet TAKE 1 TABLET BY MOUTH ONCE DAILY 30 tablet 3  . spironolactone (ALDACTONE) 25 MG tablet Take 1 tablet (25 mg total) by mouth every evening. 30 tablet 6  . tiotropium (SPIRIVA HANDIHALER) 18 MCG inhalation capsule Place 1 capsule (18 mcg total) into inhaler and inhale daily. 30 capsule 12  . potassium chloride 20 MEQ TBCR Take 20 mEq by mouth daily. (Patient not taking: Reported on 05/07/2015) 30 tablet 3    No current facility-administered medications for this encounter.   Filed Vitals:   05/07/15 0928  BP: 110/70  Pulse: 71  Weight: 190 lb 12 oz (86.524 kg)  SpO2: 98%   PHYSICAL EXAM: General:  Well appearing; No resp difficulty HEENT: normal Neck: supple. JVP flat; Carotids 2+ bilaterally; no bruits. No lymphadenopathy or thryomegaly appreciated. Cor: PMI normal. Regular rate & rhythm. No rubs, gallops. 2/6 HSM apex. Lungs: distant lung sounds, scattered end expiratory wheezes. Abdomen: soft, tender, nondistended. No hepatosplenomegaly. No bruits or masses. Good bowel sounds. Extremities: no cyanosis, clubbing, rash. No edema. Neuro: alert & orientedx3, cranial nerves grossly intact. Moves all 4 extremities w/o difficulty. Affect pleasant.  ASSESSMENT & PLAN:  1) Chronic diastolic HF:  Nonischemic cardiomyopathy, on last echo LV function has recovered => EF 55-60%, mild/mod AI, grade I DD and mild MR (05/2014).  NYHA II symptoms and volume status stable.  - Continue current dose of lasix. BMET with Guide-IT - Will repeat Echo to make sure that LV function remains normal.  - Will switch from lisinopril to 12.5 mg of losartan given chronic cough.  - Add potassium 20 meq daily 2) Dyspnea: CPX in 5/15 suggested primarily a pulmonary limitation.  She has seen pulmonary, thought to only have mild COPD.  Dyspnea was thought to be mainly due to obesity and deconditioning.  - Continue Brio per pulmonary.  3) HTN: Controlled on current medications.  4) Nicotine abuse: Discussed the need to quit smoking.  - Lungs are limiting factor on CPX. Encouraged her to continue to try and quit completely (she only smokes 1-2 cigs/day).  - Will write for low dose nicotine patch. 5) Anemia: Followed by PCP. Would like to have hysterectomy. Stable from HF perspective for hysterectomy. Continue iron  Graciella Freer PA-C  9:44 AM   Patient seen with PA agree with the above note.  Stable symptoms,  suspect dyspnea is more related to lungs than heart.  - Given chronic cough, replace lisinopril with losartan.  - Echo - nicotine patches to help with smoking.   [name] 05/07/2015

## 2015-05-12 ENCOUNTER — Telehealth: Payer: Self-pay | Admitting: *Deleted

## 2015-05-12 NOTE — Telephone Encounter (Signed)
I spoke to patient by phone to lt her know her potassium was low at 3.2 mmol/L. I reminded patient to start her Potassium 20 meq every day. Patient verbalized understanding.I asked patient to return on Aug 4 at 8:30 for follow-up study visit and lab work.

## 2015-05-14 ENCOUNTER — Ambulatory Visit (HOSPITAL_COMMUNITY)
Admission: RE | Admit: 2015-05-14 | Discharge: 2015-05-14 | Disposition: A | Discharge status: Home / Self Care | Payer: Medicaid Other | Source: Ambulatory Visit | Attending: Cardiology | Admitting: Cardiology

## 2015-05-14 DIAGNOSIS — F17200 Nicotine dependence, unspecified, uncomplicated: Secondary | ICD-10-CM | POA: Diagnosis not present

## 2015-05-14 DIAGNOSIS — I5042 Chronic combined systolic (congestive) and diastolic (congestive) heart failure: Secondary | ICD-10-CM

## 2015-05-14 DIAGNOSIS — I351 Nonrheumatic aortic (valve) insufficiency: Secondary | ICD-10-CM | POA: Diagnosis not present

## 2015-05-14 DIAGNOSIS — I517 Cardiomegaly: Secondary | ICD-10-CM | POA: Diagnosis not present

## 2015-05-14 DIAGNOSIS — I509 Heart failure, unspecified: Secondary | ICD-10-CM | POA: Diagnosis present

## 2015-05-14 DIAGNOSIS — I340 Nonrheumatic mitral (valve) insufficiency: Secondary | ICD-10-CM | POA: Diagnosis not present

## 2015-05-14 DIAGNOSIS — I10 Essential (primary) hypertension: Secondary | ICD-10-CM | POA: Diagnosis not present

## 2015-05-14 NOTE — Progress Notes (Signed)
  Echocardiogram 2D Echocardiogram has been performed.  Stacey Lin 05/14/2015, 9:54 AM

## 2015-05-25 ENCOUNTER — Encounter: Payer: Self-pay | Admitting: Internal Medicine

## 2015-06-10 ENCOUNTER — Encounter: Payer: Self-pay | Admitting: *Deleted

## 2015-06-10 DIAGNOSIS — Z006 Encounter for examination for normal comparison and control in clinical research program: Secondary | ICD-10-CM

## 2015-06-10 NOTE — Progress Notes (Signed)
STATUS FIRST Informed Consent      Subject Name: Stacey Lin    Subject met inclusion and exclusion criteria.The informed consent form, study requirements and expectations were reviewed with the subject and questions and concerns were addressed prior to the signing of the consent form. The subject verbalized understanding of the trail requirements. The subject agreed to participate in the STATUS FIRST research study and signed the informed consent at 08:53 on 06-10-15.The informed consent was obtained prior to performance of any protocol-specific procedures for the subject. A copy of the signed informed consent was given to the subject and a copy was placed in the subjects's medical record.    [name] 06/10/15 08:53

## 2015-06-11 ENCOUNTER — Encounter: Payer: Self-pay | Admitting: *Deleted

## 2015-06-11 DIAGNOSIS — Z006 Encounter for examination for normal comparison and control in clinical research program: Secondary | ICD-10-CM

## 2015-06-11 NOTE — Progress Notes (Signed)
Patient was seen for last Guide It visit. Vital signs were the following B/P 112/73, Pulse 70 Respirations 20. Weight 188 and 02 sat. 96%.Lungs clear and no lower extremity edema noted. Patient had no complaints. Patient received end of study letter and answered all of patient's questions.

## 2015-07-06 ENCOUNTER — Other Ambulatory Visit (HOSPITAL_COMMUNITY): Payer: Self-pay | Admitting: Internal Medicine

## 2015-07-15 ENCOUNTER — Other Ambulatory Visit (HOSPITAL_COMMUNITY): Payer: Self-pay | Admitting: *Deleted

## 2015-07-15 NOTE — Pre-Procedure Instructions (Signed)
    Stacey Lin  07/15/2015      [facility] [address], [place] - [address] [place] [address] Phone: [phone] Fax: [phone]  [place], [place]. [address] [place] [address] Phone: [phone] Fax: [phone]    Your procedure is scheduled on Friday, July 24, 2015 at 9:00 AM.  Report to [facility] Entrance "A" Admitting Office at 7:00 AM.  Call this number if you have problems the morning of surgery: [phone]  Any questions prior to day of surgery, please call [phone] between 8 & 4 PM.   Remember:  Do not eat food or drink liquids after midnight Thursday, 07/23/15.  Take these medicines the morning of surgery with A SIP OF WATER: Carvedilol (Coreg), Spiriva inhaler, Albuterol inhaler - if needed   Do not wear jewelry, make-up or nail polish.  Do not wear lotions, powders, or perfumes.  You may wear deodorant.  Do not shave 48 hours prior to surgery.    Do not bring valuables to the hospital.  [facility] is not responsible for any belongings or valuables.  Contacts, dentures or bridgework may not be worn into surgery.  Leave your suitcase in the car.  After surgery it may be brought to your room.  For patients admitted to the hospital, discharge time will be determined by your treatment team.  Patients discharged the day of surgery will not be allowed to drive home.   Special instructions:  See "Preparing for Surgery" Instruction sheet.  Please read over the following fact sheets that you were given. Pain Booklet, Coughing and Deep Breathing and Surgical Site Infection Prevention

## 2015-07-16 ENCOUNTER — Encounter (HOSPITAL_COMMUNITY)
Admission: RE | Admit: 2015-07-16 | Discharge: 2015-07-16 | Disposition: A | Discharge status: Home / Self Care | Payer: Medicaid Other | Source: Ambulatory Visit | Attending: Oral Surgery | Admitting: Oral Surgery

## 2015-07-16 ENCOUNTER — Encounter (HOSPITAL_COMMUNITY): Payer: Self-pay

## 2015-07-16 DIAGNOSIS — I509 Heart failure, unspecified: Secondary | ICD-10-CM | POA: Insufficient documentation

## 2015-07-16 DIAGNOSIS — Z01812 Encounter for preprocedural laboratory examination: Secondary | ICD-10-CM | POA: Diagnosis not present

## 2015-07-16 DIAGNOSIS — F17200 Nicotine dependence, unspecified, uncomplicated: Secondary | ICD-10-CM | POA: Diagnosis not present

## 2015-07-16 DIAGNOSIS — K029 Dental caries, unspecified: Secondary | ICD-10-CM | POA: Insufficient documentation

## 2015-07-16 DIAGNOSIS — Z01818 Encounter for other preprocedural examination: Secondary | ICD-10-CM | POA: Insufficient documentation

## 2015-07-16 DIAGNOSIS — I10 Essential (primary) hypertension: Secondary | ICD-10-CM | POA: Insufficient documentation

## 2015-07-16 DIAGNOSIS — Z79899 Other long term (current) drug therapy: Secondary | ICD-10-CM | POA: Diagnosis not present

## 2015-07-16 DIAGNOSIS — M270 Developmental disorders of jaws: Secondary | ICD-10-CM | POA: Diagnosis not present

## 2015-07-16 DIAGNOSIS — J45909 Unspecified asthma, uncomplicated: Secondary | ICD-10-CM | POA: Insufficient documentation

## 2015-07-16 DIAGNOSIS — J449 Chronic obstructive pulmonary disease, unspecified: Secondary | ICD-10-CM | POA: Insufficient documentation

## 2015-07-16 DIAGNOSIS — E785 Hyperlipidemia, unspecified: Secondary | ICD-10-CM | POA: Insufficient documentation

## 2015-07-16 HISTORY — DX: Chronic obstructive pulmonary disease, unspecified: J44.9

## 2015-07-16 HISTORY — DX: Heart failure, unspecified: I50.9

## 2015-07-16 HISTORY — DX: Angina pectoris, unspecified: I20.9

## 2015-07-16 LAB — BASIC METABOLIC PANEL
Anion gap: 8 (ref 5–15)
BUN: 24 mg/dL — ABNORMAL HIGH (ref 6–20)
CALCIUM: 8.9 mg/dL (ref 8.9–10.3)
CO2: 28 mmol/L (ref 22–32)
CREATININE: 1.24 mg/dL — AB (ref 0.44–1.00)
Chloride: 101 mmol/L (ref 101–111)
GFR calc non Af Amer: 49 mL/min — ABNORMAL LOW (ref >60)
GFR, EST AFRICAN AMERICAN: 57 mL/min — AB (ref >60)
Glucose, Bld: 119 mg/dL — ABNORMAL HIGH (ref 65–99)
Potassium: 3.9 mmol/L (ref 3.5–5.1)
SODIUM: 137 mmol/L (ref 135–145)

## 2015-07-16 LAB — CBC
HCT: 38.3 % (ref 36.0–46.0)
Hemoglobin: 12.2 g/dL (ref 12.0–15.0)
MCH: 30.2 pg (ref 26.0–34.0)
MCHC: 31.9 g/dL (ref 30.0–36.0)
MCV: 94.8 fL (ref 78.0–100.0)
PLATELETS: 295 10*3/uL (ref 150–400)
RBC: 4.04 MIL/uL (ref 3.87–5.11)
RDW: 12.4 % (ref 11.5–15.5)
WBC: 8.3 10*3/uL (ref 4.0–10.5)

## 2015-07-16 LAB — HCG, SERUM, QUALITATIVE: PREG SERUM: NEGATIVE

## 2015-07-16 NOTE — Progress Notes (Signed)
   07/16/15 0826  OBSTRUCTIVE SLEEP APNEA  Have you ever been diagnosed with sleep apnea through a sleep study? No  Do you snore loudly (loud enough to be heard through closed doors)?  0  Do you often feel tired, fatigued, or sleepy during the daytime (such as falling asleep during driving or talking to someone)? 1  Has anyone observed you stop breathing during your sleep? 1  Do you have, or are you being treated for high blood pressure? 1  BMI more than 35 kg/m2? 1  Age > 50 (1-yes) 1  Neck circumference greater than:Female 16 inches or larger, Female 17inches or larger? 0  Female Gender (Yes=1) 0  Obstructive Sleep Apnea Score 5  Score 5 or greater  Results sent to PCP

## 2015-07-17 NOTE — Progress Notes (Signed)
Anesthesia Chart Review:  Pt is 49 year old female scheduled for multiple dental extractions with alveoloplasty, bilateral tori on 07/24/2015 with Dr. Hoyt Koch.   Cardiologist is Dr. Aundra Dubin. Goes to [facility] for primary care. Pulmonologist is Dr. Gwenette Greet (follows pt for asthmatic bronchitis; per Dr. Janifer Adie notes, pt does not have COPD).   PMH includes: HTN, CHF, hyperlipidemia, arrhythmia, anemia, asthma, COPD. Current smoker. BMI 37.   Medications include: albuterol, lipitor, carvedilol, iron, lasix, losartan, spironolactone, spiriva.   Preoperative labs reviewed.    EKG 05/07/2015: Sinus rhythm with 1st degree A-V block. Low voltage QRS. T wave abnormality, consider anterolateral ischemia. Anterolateral ischemia intermittently present on EKG since 2014.   Echo 05/14/2015:  - Left ventricle: The cavity size was normal. Wall thickness was normal. Systolic function was normal. The estimated ejection fraction was in the range of 60% to 65%. Doppler parameters are consistent with abnormal left ventricular relaxation (grade 1 diastolic dysfunction). - Aortic valve: There was moderate regurgitation. - Mitral valve: There was mild regurgitation. - Right atrium: The atrium was mildly dilated.  Cardiac cath 06/06/2014:  1. 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 Normal EF 3. Normal filling pressures and output  Given normal coronaries on cath a year ago, I anticipate pt can proceed as scheduled.   Willeen Cass, FNP-BC [facility]/Anesthesiology Phone: [phone] 07/17/2015 3:28 PM

## 2015-07-21 NOTE — H&P (Signed)
HISTORY AND PHYSICAL  Stacey Lin is a 49 y.o. female patient referred by general dentist for full mouth extractions.  No diagnosis found.  Past Medical History  Diagnosis Date  . Hypertension   . Anemia   . Arrhythmia   . Hypercholesteremia   . Chronic headaches   . Asthma   . Anginal pain     2 months  . CHF (congestive heart failure)     1990s  . COPD (chronic obstructive pulmonary disease)     ??    No current facility-administered medications for this encounter.   Current Outpatient Prescriptions  Medication Sig Dispense Refill  . albuterol (PROAIR HFA) 108 (90 BASE) MCG/ACT inhaler Inhale 2 puffs into the lungs every 6 (six) hours as needed for wheezing or shortness of breath. 1 Inhaler 5  . atorvastatin (LIPITOR) 40 MG tablet Take 40 mg by mouth daily.    . carvedilol (COREG) 6.25 MG tablet TAKE 1 & 1/2 TABLETS BY MOUTH IN THE MORNING AND 2 TABLETS IN THE EVENING 105 tablet 3  . Diphenhydramine-APAP, sleep, (TYLENOL PM EXTRA STRENGTH PO) Take 4 tablets by mouth at bedtime as needed (sleep).    . ferrous sulfate 325 (65 FE) MG tablet Take 325 mg by mouth daily with breakfast.    . furosemide (LASIX) 40 MG tablet Take 2 tablets (80 mg total) by mouth 2 (two) times daily. 120 tablet 3  . losartan (COZAAR) 25 MG tablet Take 0.5 tablets (12.5 mg total) by mouth daily. 45 tablet 3  . spironolactone (ALDACTONE) 25 MG tablet Take 1 tablet (25 mg total) by mouth every evening. 30 tablet 6  . tiotropium (SPIRIVA HANDIHALER) 18 MCG inhalation capsule Place 1 capsule (18 mcg total) into inhaler and inhale daily. 30 capsule 12   No Known Allergies Active Problems:   * No active hospital problems. *  Vitals: Last menstrual period 05/02/2014. Lab results:No results found for this or any previous visit (from the past 49 hour(s)). Radiology Results: No results found. General appearance: alert, cooperative and moderately obese Head: Normocephalic, without obvious abnormality,  atraumatic Eyes: negative Nose: Nares normal. Septum midline. Mucosa normal. No drainage or sinus tenderness. Throat: Multiple caries teeth # 8, 11, 12, 16, 17, 22, 23, 24,26, 27, 28, 29, bilateral mandibular lingual tori; pharynx clear no trismus Neck: no adenopathy, supple, symmetrical, trachea midline and thyroid not enlarged, symmetric, no tenderness/mass/nodules Resp: clear to auscultation bilaterally Cardio: regular rate and rhythm, S1, S2 normal, no murmur, click, rub or gallop  Assessment: All teeth non-restorable secondary to dental caries, periodontitis. Bilateral mandibular lingual tori.  Plan:Full mouth extractions with alveoloplasty. Removal bilateral mandibular lingual tori. General anesthesia. Day surgery.   Gae Bon 07/21/2015

## 2015-07-23 MED ORDER — CEFAZOLIN SODIUM-DEXTROSE 2-3 GM-% IV SOLR
2.0000 g | INTRAVENOUS | Status: AC
  Administered 2015-07-24: 2 g via INTRAVENOUS
  Filled 2015-07-23: qty 50

## 2015-07-24 ENCOUNTER — Ambulatory Visit (HOSPITAL_COMMUNITY)
Admission: RE | Admit: 2015-07-24 | Discharge: 2015-07-24 | Disposition: A | Discharge status: Home / Self Care | Payer: Medicaid Other | Source: Ambulatory Visit | Attending: Oral Surgery | Admitting: Oral Surgery

## 2015-07-24 ENCOUNTER — Ambulatory Visit (HOSPITAL_COMMUNITY): Payer: Medicaid Other | Admitting: Emergency Medicine

## 2015-07-24 ENCOUNTER — Encounter (HOSPITAL_COMMUNITY)
Admission: RE | Disposition: A | Discharge status: Home / Self Care | Payer: Self-pay | Source: Ambulatory Visit | Attending: Oral Surgery

## 2015-07-24 ENCOUNTER — Encounter (HOSPITAL_COMMUNITY): Payer: Self-pay | Admitting: General Practice

## 2015-07-24 ENCOUNTER — Ambulatory Visit (HOSPITAL_COMMUNITY): Payer: Medicaid Other | Admitting: Anesthesiology

## 2015-07-24 DIAGNOSIS — Z79899 Other long term (current) drug therapy: Secondary | ICD-10-CM | POA: Insufficient documentation

## 2015-07-24 DIAGNOSIS — I10 Essential (primary) hypertension: Secondary | ICD-10-CM | POA: Insufficient documentation

## 2015-07-24 DIAGNOSIS — I509 Heart failure, unspecified: Secondary | ICD-10-CM | POA: Insufficient documentation

## 2015-07-24 DIAGNOSIS — D649 Anemia, unspecified: Secondary | ICD-10-CM | POA: Diagnosis not present

## 2015-07-24 DIAGNOSIS — Z6836 Body mass index (BMI) 36.0-36.9, adult: Secondary | ICD-10-CM | POA: Diagnosis not present

## 2015-07-24 DIAGNOSIS — K029 Dental caries, unspecified: Secondary | ICD-10-CM | POA: Diagnosis not present

## 2015-07-24 DIAGNOSIS — E668 Other obesity: Secondary | ICD-10-CM | POA: Insufficient documentation

## 2015-07-24 DIAGNOSIS — M270 Developmental disorders of jaws: Secondary | ICD-10-CM | POA: Insufficient documentation

## 2015-07-24 DIAGNOSIS — E780 Pure hypercholesterolemia: Secondary | ICD-10-CM | POA: Insufficient documentation

## 2015-07-24 DIAGNOSIS — K011 Impacted teeth: Secondary | ICD-10-CM | POA: Insufficient documentation

## 2015-07-24 DIAGNOSIS — J45909 Unspecified asthma, uncomplicated: Secondary | ICD-10-CM | POA: Diagnosis not present

## 2015-07-24 DIAGNOSIS — J449 Chronic obstructive pulmonary disease, unspecified: Secondary | ICD-10-CM | POA: Insufficient documentation

## 2015-07-24 HISTORY — PX: MULTIPLE EXTRACTIONS WITH ALVEOLOPLASTY: SHX5342

## 2015-07-24 SURGERY — MULTIPLE EXTRACTION WITH ALVEOLOPLASTY
Anesthesia: General | Site: Mouth | Laterality: Bilateral

## 2015-07-24 MED ORDER — ONDANSETRON HCL 4 MG/2ML IJ SOLN
INTRAMUSCULAR | Status: DC | PRN
  Administered 2015-07-24: 4 mg via INTRAVENOUS

## 2015-07-24 MED ORDER — LIDOCAINE-EPINEPHRINE 2 %-1:100000 IJ SOLN
INTRAMUSCULAR | Status: DC | PRN
  Administered 2015-07-24: 15 mL via INTRADERMAL

## 2015-07-24 MED ORDER — LIDOCAINE HCL (CARDIAC) 20 MG/ML IV SOLN
INTRAVENOUS | Status: DC | PRN
  Administered 2015-07-24: 50 mg via INTRAVENOUS

## 2015-07-24 MED ORDER — PHENYLEPHRINE 40 MCG/ML (10ML) SYRINGE FOR IV PUSH (FOR BLOOD PRESSURE SUPPORT)
PREFILLED_SYRINGE | INTRAVENOUS | Status: AC
  Filled 2015-07-24: qty 20

## 2015-07-24 MED ORDER — ROCURONIUM BROMIDE 100 MG/10ML IV SOLN
INTRAVENOUS | Status: DC | PRN
  Administered 2015-07-24: 35 mg via INTRAVENOUS

## 2015-07-24 MED ORDER — FENTANYL CITRATE (PF) 100 MCG/2ML IJ SOLN
INTRAMUSCULAR | Status: AC
  Filled 2015-07-24: qty 2

## 2015-07-24 MED ORDER — OXYMETAZOLINE HCL 0.05 % NA SOLN
NASAL | Status: AC
  Filled 2015-07-24: qty 15

## 2015-07-24 MED ORDER — LIDOCAINE HCL (CARDIAC) 20 MG/ML IV SOLN
INTRAVENOUS | Status: AC
  Filled 2015-07-24: qty 10

## 2015-07-24 MED ORDER — SUGAMMADEX SODIUM 200 MG/2ML IV SOLN
INTRAVENOUS | Status: DC | PRN
  Administered 2015-07-24: 200 mg via INTRAVENOUS

## 2015-07-24 MED ORDER — FENTANYL CITRATE (PF) 100 MCG/2ML IJ SOLN
25.0000 ug | INTRAMUSCULAR | Status: DC | PRN
  Administered 2015-07-24 (×2): 50 ug via INTRAVENOUS

## 2015-07-24 MED ORDER — FENTANYL CITRATE (PF) 250 MCG/5ML IJ SOLN
INTRAMUSCULAR | Status: AC
  Filled 2015-07-24: qty 5

## 2015-07-24 MED ORDER — ONDANSETRON HCL 4 MG/2ML IJ SOLN: 4.0000 mg | Freq: Once | INTRAMUSCULAR | Status: DC | PRN

## 2015-07-24 MED ORDER — OXYCODONE-ACETAMINOPHEN 5-325 MG PO TABS: 1.0000 | ORAL_TABLET | ORAL | Status: DC | PRN

## 2015-07-24 MED ORDER — 0.9 % SODIUM CHLORIDE (POUR BTL) OPTIME
TOPICAL | Status: DC | PRN
  Administered 2015-07-24: 1000 mL

## 2015-07-24 MED ORDER — MIDAZOLAM HCL 5 MG/5ML IJ SOLN
INTRAMUSCULAR | Status: DC | PRN
  Administered 2015-07-24: 2 mg via INTRAVENOUS

## 2015-07-24 MED ORDER — LIDOCAINE-EPINEPHRINE 2 %-1:100000 IJ SOLN
INTRAMUSCULAR | Status: AC
  Filled 2015-07-24: qty 1

## 2015-07-24 MED ORDER — PROPOFOL 10 MG/ML IV BOLUS
INTRAVENOUS | Status: DC | PRN
  Administered 2015-07-24: 200 mg via INTRAVENOUS

## 2015-07-24 MED ORDER — SODIUM CHLORIDE 0.9 % IR SOLN
Status: DC | PRN
  Administered 2015-07-24: 1000 mL

## 2015-07-24 MED ORDER — OXYCODONE HCL 5 MG PO TABS: 5.0000 mg | ORAL_TABLET | Freq: Once | ORAL | Status: DC | PRN

## 2015-07-24 MED ORDER — MIDAZOLAM HCL 2 MG/2ML IJ SOLN
INTRAMUSCULAR | Status: AC
  Filled 2015-07-24: qty 4

## 2015-07-24 MED ORDER — FENTANYL CITRATE (PF) 100 MCG/2ML IJ SOLN
INTRAMUSCULAR | Status: DC | PRN
  Administered 2015-07-24: 50 ug via INTRAVENOUS

## 2015-07-24 MED ORDER — PROPOFOL 10 MG/ML IV BOLUS
INTRAVENOUS | Status: AC
  Filled 2015-07-24: qty 20

## 2015-07-24 MED ORDER — PHENYLEPHRINE HCL 10 MG/ML IJ SOLN
INTRAMUSCULAR | Status: DC | PRN
  Administered 2015-07-24: 120 ug via INTRAVENOUS

## 2015-07-24 MED ORDER — SUGAMMADEX SODIUM 200 MG/2ML IV SOLN
INTRAVENOUS | Status: AC
  Filled 2015-07-24: qty 2

## 2015-07-24 MED ORDER — LACTATED RINGERS IV SOLN
INTRAVENOUS | Status: DC
  Administered 2015-07-24: 09:00:00 via INTRAVENOUS

## 2015-07-24 MED ORDER — OXYCODONE HCL 5 MG/5ML PO SOLN: 5.0000 mg | Freq: Once | ORAL | Status: DC | PRN

## 2015-07-24 MED ORDER — LACTATED RINGERS IV SOLN
INTRAVENOUS | Status: DC | PRN
  Administered 2015-07-24 (×2): via INTRAVENOUS

## 2015-07-24 MED ORDER — ARTIFICIAL TEARS OP OINT
TOPICAL_OINTMENT | OPHTHALMIC | Status: DC | PRN
  Administered 2015-07-24: 1 via OPHTHALMIC

## 2015-07-24 SURGICAL SUPPLY — 29 items
BUR CROSS CUT FISSURE 1.6 (BURR) ×2 IMPLANT
BUR CROSS CUT FISSURE 1.6MM (BURR) ×1
BUR EGG ELITE 4.0 (BURR) ×1 IMPLANT
BUR EGG ELITE 4.0MM (BURR) ×1
CANISTER SUCTION 2500CC (MISCELLANEOUS) ×3 IMPLANT
COVER SURGICAL LIGHT HANDLE (MISCELLANEOUS) ×3 IMPLANT
CRADLE DONUT ADULT HEAD (MISCELLANEOUS) ×3 IMPLANT
FLUID NSS /IRRIG 1000 ML XXX (MISCELLANEOUS) ×3 IMPLANT
GAUZE PACKING FOLDED 2  STR (GAUZE/BANDAGES/DRESSINGS) ×2
GAUZE PACKING FOLDED 2 STR (GAUZE/BANDAGES/DRESSINGS) ×1 IMPLANT
GLOVE BIO SURGEON STRL SZ 6.5 (GLOVE) ×2 IMPLANT
GLOVE BIO SURGEON STRL SZ7.5 (GLOVE) ×3 IMPLANT
GLOVE BIO SURGEONS STRL SZ 6.5 (GLOVE) ×1
GLOVE BIOGEL PI IND STRL 7.0 (GLOVE) ×1 IMPLANT
GLOVE BIOGEL PI INDICATOR 7.0 (GLOVE) ×2
GOWN STRL REUS W/ TWL LRG LVL3 (GOWN DISPOSABLE) ×1 IMPLANT
GOWN STRL REUS W/ TWL XL LVL3 (GOWN DISPOSABLE) ×1 IMPLANT
GOWN STRL REUS W/TWL LRG LVL3 (GOWN DISPOSABLE) ×3
GOWN STRL REUS W/TWL XL LVL3 (GOWN DISPOSABLE) ×3
KIT BASIN OR (CUSTOM PROCEDURE TRAY) ×3 IMPLANT
KIT ROOM TURNOVER OR (KITS) ×3 IMPLANT
NEEDLE 22X1 1/2 (OR ONLY) (NEEDLE) ×6 IMPLANT
NS IRRIG 1000ML POUR BTL (IV SOLUTION) ×3 IMPLANT
PAD ARMBOARD 7.5X6 YLW CONV (MISCELLANEOUS) ×3 IMPLANT
SUT CHROMIC 3 0 PS 2 (SUTURE) ×6 IMPLANT
SYR CONTROL 10ML LL (SYRINGE) ×3 IMPLANT
TRAY ENT MC OR (CUSTOM PROCEDURE TRAY) ×3 IMPLANT
TUBING IRRIGATION (MISCELLANEOUS) ×3 IMPLANT
YANKAUER SUCT BULB TIP NO VENT (SUCTIONS) ×3 IMPLANT

## 2015-07-24 NOTE — H&P (Signed)
H&P documentation  -History and Physical Reviewed  -Patient has been re-examined  -No change in the plan of care  JENSEN,SCOTT M  

## 2015-07-24 NOTE — Op Note (Signed)
07/24/2015  10:28 AM  PATIENT:  Stacey Lin  49 y.o. female  PRE-OPERATIVE DIAGNOSIS:  NON RESTORABLE TEETH # 8, 11, 12, 17, 22, 23, 24, 26, 27, 28, 29, IMPACTED TOOTH # 16, BILATERAL MANDIBULAR LINGUAL TORI  POST-OPERATIVE DIAGNOSIS:  SAME  PROCEDURE:  Procedure(s): MULTIPLE EXTRACTIONS WITH ALVEOLOPLASTY,  BILATERAL TORI   SURGEON:  Surgeon(s): Diona Browner, DDS  ANESTHESIA:   local and general  EBL:  minimal  DRAINS: none   SPECIMEN:  No Specimen  COUNTS:  YES  PLAN OF CARE: Discharge to home after PACU  PATIENT DISPOSITION:  PACU - hemodynamically stable.   PROCEDURE DETAILS: Dictation #   Gae Bon, DMD 07/24/2015 10:28 AM

## 2015-07-24 NOTE — Progress Notes (Signed)
Pt voided qs with loose bm in bed pt cleaned

## 2015-07-24 NOTE — Anesthesia Preprocedure Evaluation (Addendum)
Anesthesia Evaluation  Patient identified by MRN, date of birth, ID band Patient awake    Reviewed: Allergy & Precautions, NPO status , Patient's Chart, lab work & pertinent test results  Airway Mallampati: II  TM Distance: >3 FB Neck ROM: Full    Dental  (+) Poor Dentition, Dental Advidsory Given   Pulmonary shortness of breath and lying, COPD, Current Smoker,    breath sounds clear to auscultation       Cardiovascular hypertension,  Rhythm:Regular     Neuro/Psych  Headaches, PSYCHIATRIC DISORDERS    GI/Hepatic   Endo/Other    Renal/GU Renal disease     Musculoskeletal   Abdominal (+) + obese,   Peds  Hematology  (+) anemia ,   Anesthesia Other Findings   Reproductive/Obstetrics                            Anesthesia Physical Anesthesia Plan  ASA: III  Anesthesia Plan: General   Post-op Pain Management:    Induction: Intravenous  Airway Management Planned: Nasal ETT  Additional Equipment:   Intra-op Plan:   Post-operative Plan: Extubation in OR  Informed Consent: I have reviewed the patients History and Physical, chart, labs and discussed the procedure including the risks, benefits and alternatives for the proposed anesthesia with the patient or authorized representative who has indicated his/her understanding and acceptance.   Dental advisory given and Dental Advisory Given  Plan Discussed with: CRNA and Anesthesiologist  Anesthesia Plan Comments: (Poor dentition COPD/Smoker Hypertension H/O CHF mild AI/MR, nl EF by ECHO 7/16, no obstructive CAD by cath 06/06/14  Plan GA with nasal ETT  Roberts Gaudy)       Anesthesia Quick Evaluation

## 2015-07-24 NOTE — Transfer of Care (Signed)
Immediate Anesthesia Transfer of Care Note  Patient: Stacey Lin  Procedure(s) Performed: Procedure(s): MULTIPLE EXTRACTIONS WITH ALVEOLOPLASTY,  BILATERAL TORI  (Bilateral)  Patient Location: PACU  Anesthesia Type:General  Level of Consciousness: awake, alert  and oriented  Airway & Oxygen Therapy: Patient Spontanous Breathing and Patient connected to face mask oxygen  Post-op Assessment: Report given to RN, Post -op Vital signs reviewed and stable and Patient moving all extremities X 4  Post vital signs: Reviewed and stable  Last Vitals:  Filed Vitals:   07/24/15 0822  BP: 100/68  Pulse: 72  Temp: 36.9 C  Resp: 18    Complications: No apparent anesthesia complications

## 2015-07-24 NOTE — Progress Notes (Signed)
Report given to erika rn as caregiver 

## 2015-07-24 NOTE — Anesthesia Procedure Notes (Signed)
Procedure Name: Intubation Date/Time: 07/24/2015 9:31 AM Performed by: Neldon Newport Pre-anesthesia Checklist: Patient being monitored, Suction available, Emergency Drugs available, Patient identified and Timeout performed Patient Re-evaluated:Patient Re-evaluated prior to inductionOxygen Delivery Method: Circle system utilized Preoxygenation: Pre-oxygenation with 100% oxygen Intubation Type: IV induction Ventilation: Mask ventilation without difficulty Laryngoscope Size: Mac and 3 Grade View: Grade I Nasal Tubes: Right, Nasal prep performed, Nasal Rae and Magill forceps- large, utilized Tube size: 7.0 mm Number of attempts: 1 Placement Confirmation: positive ETCO2,  ETT inserted through vocal cords under direct vision and breath sounds checked- equal and bilateral Tube secured with: Tape Dental Injury: Teeth and Oropharynx as per pre-operative assessment

## 2015-07-25 NOTE — Op Note (Signed)
NAME:  Stacey Lin, Stacey Lin             ACCOUNT NO.:  [id number]  MEDICAL RECORD NO.:  [id number]  LOCATION:  [facility]                         FACILITY:  [facility]  PHYSICIAN:  Georgia Lopes, M.D.  DATE OF BIRTH:  1966/06/03  DATE OF PROCEDURE:  07/24/2015 DATE OF DISCHARGE:  07/24/2015                              OPERATIVE REPORT   PREOPERATIVE DIAGNOSES:  Nonrestorable teeth numbers 8, 11, 12, 17, 22, 23, 24, 26, 27, 28, 29.  Impacted tooth #16.  Bilateral mandibular lingual tori.  POSTOPERATIVE DIAGNOSES:  Nonrestorable teeth numbers 8, 11, 12, 17, 22, 23, 24, 26, 27, 28, 29.  Impacted tooth #16.  Bilateral mandibular lingual tori.  PROCEDURE:  Extraction of teeth number 8, 11, 12, 17, 16, 22, 23, 24, 26, 27, 28, and 29.  Alveoplasty of right and left maxilla and mandible. Removal of bilateral mandibular lingual tori.  SURGEON:  Georgia Lopes, M.D.  ANESTHESIA:  General nasal intubation.  PROCEDURE IN DETAIL:  The patient was taken to the operating room, placed on the table in supine position.  General anesthesia was administered intravenously and a nasal endotracheal tube was placed and secured.  The eyes were protected, and the patient was draped for the procedure.  Time-out was performed.  The posterior pharynx was suctioned, and a throat pack was placed.  2% lidocaine 1:100,000 epinephrine was infiltrated in an inferior alveolar block on the right and left side and buccal and palatal infiltration in the maxilla around the teeth to be removed, total of 15 mL was utilized.  A bite block was placed in the right side of the mouth and a sweetheart retractor was used to retract the tongue.  A 15 blade was used to make an incision in the midportion of the mandibular left alveolar crest proximally in the area of tooth #18.  This was carried forward along the crest of the ridge, until teeth numbers 22, 23, 24 were encountered, then the incision was created both lingually and buccally  around these teeth until the midline was reached; and in the maxilla, a 15 blade was used to make an incision overlying tooth #16, carried forward on the crest of the ridge, until teeth numbers 12 and 11 were encountered, they were incised both buccally and palatally.  The periosteum was reflected from the left maxilla and mandible, and then the teeth numbers 22, 23, 24 were removed using a 301 elevator and an Asch forceps.  In the maxilla, bone was removed overlying tooth #16, and the tooth was sectioned and removed with a dental forceps.  Teeth numbers 11 and 12 were removed using the 301 elevator and upper universal forceps.  Then, the sockets were curetted in the left maxilla and mandible.  The alveolar ridge was exposed in both arches as well as the torus in the left mandible, then an egg-shaped bur and bone file were used to perform alveoplasty in the left maxilla and then in the left mandible, and then the left lingual torus was removed using the egg-shaped bur and bone file.  Then, the areas were irrigated and closed with 3-0 chromic.  A large bite block was placed in the mouth on the left side  to replace the smaller one that was removed and then the sweetheart retractor was used to retract the tongue.  A #15 blade was used to make an incision around teeth numbers 26, 27, 28,  and 29, and then the incision was carried proximally for 1.5 cm along the crest of the ridge in the mandible.  Then, an incision was made around tooth #8 and carried proximally along the crest of the maxillary ridge for approximately 1.5 cm.  The periosteum was elevated with a periosteal elevator.  The teeth were elevated with the 301 elevator and removed from the mouth with a dental forceps.  The sockets were curetted and then the periosteum was reflected to expose the residual alveolar ridge in the right maxilla and right mandible as well as the right lingual torus of the mandible.  Then, the alveoplasty  was performed and the torus was removed using the egg-shaped bur and bone file.  Then, these areas were satisfactorily contoured.  The areas were irrigated and closed with 3-0 chromic.  Then, the oral cavity was inspected and found to have good contour hemostasis and closure.  The oral cavity was irrigated and suctioned.  The throat pack was removed. The patient awakened and taken to the recovery room, breathing spontaneously in good condition.  ESTIMATED BLOOD LOSS:  Minimal.  COMPLICATIONS:  None.  SPECIMENS:  None.     Georgia Lopes, M.D.     SMJ/MEDQ  D:  07/24/2015  T:  07/25/2015  Job:  [id number]

## 2015-07-26 ENCOUNTER — Encounter (HOSPITAL_COMMUNITY): Payer: Self-pay | Admitting: Oral Surgery

## 2015-07-29 NOTE — Anesthesia Postprocedure Evaluation (Signed)
  Anesthesia Post-op Note  Patient: Stacey Lin  Procedure(s) Performed: Procedure(s): MULTIPLE EXTRACTIONS WITH ALVEOLOPLASTY,  BILATERAL TORI  (Bilateral)  Patient Location: PACU  Anesthesia Type:General  Level of Consciousness: awake, alert  and oriented  Airway and Oxygen Therapy: Patient Spontanous Breathing  Post-op Pain: mild  Post-op Assessment: Post-op Vital signs reviewed, Patient's Cardiovascular Status Stable, Respiratory Function Stable, Patent Airway, No signs of Nausea or vomiting and Pain level controlled              Post-op Vital Signs: stable  Last Vitals:  Filed Vitals:   07/24/15 1204  BP: 132/67  Pulse: 57  Temp:   Resp: 15    Complications: No apparent anesthesia complications

## 2015-08-24 ENCOUNTER — Ambulatory Visit (HOSPITAL_COMMUNITY)
Admission: RE | Admit: 2015-08-24 | Discharge: 2015-08-24 | Disposition: A | Discharge status: Home / Self Care | Payer: Medicaid Other | Source: Ambulatory Visit | Attending: Cardiology | Admitting: Cardiology

## 2015-08-24 VITALS — BP 148/90 | HR 76 | Wt 195.5 lb

## 2015-08-24 DIAGNOSIS — Z79899 Other long term (current) drug therapy: Secondary | ICD-10-CM | POA: Diagnosis not present

## 2015-08-24 DIAGNOSIS — Z8249 Family history of ischemic heart disease and other diseases of the circulatory system: Secondary | ICD-10-CM | POA: Diagnosis not present

## 2015-08-24 DIAGNOSIS — E7800 Pure hypercholesterolemia, unspecified: Secondary | ICD-10-CM | POA: Diagnosis not present

## 2015-08-24 DIAGNOSIS — I428 Other cardiomyopathies: Secondary | ICD-10-CM | POA: Diagnosis not present

## 2015-08-24 DIAGNOSIS — R0600 Dyspnea, unspecified: Secondary | ICD-10-CM | POA: Insufficient documentation

## 2015-08-24 DIAGNOSIS — J45909 Unspecified asthma, uncomplicated: Secondary | ICD-10-CM | POA: Insufficient documentation

## 2015-08-24 DIAGNOSIS — I5021 Acute systolic (congestive) heart failure: Secondary | ICD-10-CM

## 2015-08-24 DIAGNOSIS — F17200 Nicotine dependence, unspecified, uncomplicated: Secondary | ICD-10-CM

## 2015-08-24 DIAGNOSIS — I10 Essential (primary) hypertension: Secondary | ICD-10-CM

## 2015-08-24 DIAGNOSIS — I110 Hypertensive heart disease with heart failure: Secondary | ICD-10-CM | POA: Diagnosis not present

## 2015-08-24 DIAGNOSIS — R5383 Other fatigue: Secondary | ICD-10-CM | POA: Diagnosis not present

## 2015-08-24 DIAGNOSIS — I5042 Chronic combined systolic (congestive) and diastolic (congestive) heart failure: Secondary | ICD-10-CM

## 2015-08-24 DIAGNOSIS — R0683 Snoring: Secondary | ICD-10-CM | POA: Diagnosis not present

## 2015-08-24 DIAGNOSIS — F17210 Nicotine dependence, cigarettes, uncomplicated: Secondary | ICD-10-CM | POA: Diagnosis not present

## 2015-08-24 DIAGNOSIS — I5032 Chronic diastolic (congestive) heart failure: Secondary | ICD-10-CM | POA: Insufficient documentation

## 2015-08-24 LAB — BASIC METABOLIC PANEL
ANION GAP: 8 (ref 5–15)
BUN: 8 mg/dL (ref 6–20)
CALCIUM: 9.7 mg/dL (ref 8.9–10.3)
CO2: 29 mmol/L (ref 22–32)
Chloride: 106 mmol/L (ref 101–111)
Creatinine, Ser: 0.98 mg/dL (ref 0.44–1.00)
GFR calc Af Amer: >60 mL/min (ref >60)
GLUCOSE: 107 mg/dL — AB (ref 65–99)
POTASSIUM: 3.9 mmol/L (ref 3.5–5.1)
SODIUM: 143 mmol/L (ref 135–145)

## 2015-08-24 LAB — TSH: TSH: 1.846 u[IU]/mL (ref 0.350–4.500)

## 2015-08-24 LAB — CBC
HCT: 35.8 % — ABNORMAL LOW (ref 36.0–46.0)
Hemoglobin: 11 g/dL — ABNORMAL LOW (ref 12.0–15.0)
MCH: 29.3 pg (ref 26.0–34.0)
MCHC: 30.7 g/dL (ref 30.0–36.0)
MCV: 95.5 fL (ref 78.0–100.0)
PLATELETS: 285 10*3/uL (ref 150–400)
RBC: 3.75 MIL/uL — AB (ref 3.87–5.11)
RDW: 13.2 % (ref 11.5–15.5)
WBC: 6.8 10*3/uL (ref 4.0–10.5)

## 2015-08-24 LAB — BRAIN NATRIURETIC PEPTIDE: B NATRIURETIC PEPTIDE 5: 238.7 pg/mL — AB (ref 0.0–100.0)

## 2015-08-24 MED ORDER — TORSEMIDE 20 MG PO TABS: 80.0000 mg | ORAL_TABLET | Freq: Every day | ORAL | Status: DC

## 2015-08-24 MED ORDER — FUROSEMIDE 10 MG/ML IJ SOLN
80.0000 mg | Freq: Once | INTRAMUSCULAR | Status: AC
  Administered 2015-08-24: 80 mg via INTRAVENOUS
  Filled 2015-08-24: qty 8

## 2015-08-24 MED ORDER — SPIRONOLACTONE 25 MG PO TABS: 25.0000 mg | ORAL_TABLET | Freq: Every evening | ORAL | Status: DC

## 2015-08-24 MED ORDER — CARVEDILOL 6.25 MG PO TABS: ORAL_TABLET | ORAL | Status: DC

## 2015-08-24 MED ORDER — ATORVASTATIN CALCIUM 40 MG PO TABS: 40.0000 mg | ORAL_TABLET | Freq: Every day | ORAL | Status: DC

## 2015-08-24 MED ORDER — POTASSIUM CHLORIDE CRYS ER 20 MEQ PO TBCR
20.0000 meq | EXTENDED_RELEASE_TABLET | Freq: Once | ORAL | Status: AC
  Administered 2015-08-24: 20 meq via ORAL
  Filled 2015-08-24: qty 1

## 2015-08-24 MED ORDER — POTASSIUM CHLORIDE CRYS ER 20 MEQ PO TBCR: 20.0000 meq | EXTENDED_RELEASE_TABLET | Freq: Every day | ORAL | Status: DC

## 2015-08-24 MED ORDER — LOSARTAN POTASSIUM 25 MG PO TABS: 12.5000 mg | ORAL_TABLET | Freq: Every day | ORAL | Status: DC

## 2015-08-24 NOTE — Patient Instructions (Signed)
Stop Furosemide (Lasix)  Start Torsemide 80 mg (4 tabs) daily, starting Tue 11/1  Start Potassium 20 meq daily  Take your Spiriva and Brio daily   Labs today  Your physician recommends that you schedule a follow-up appointment in: 1 week

## 2015-08-24 NOTE — Progress Notes (Signed)
Patient ID: Stacey Lin, female   DOB: 27-Jul-1966, 49 y.o.   MRN: [id number] PCP: [facility] Pulmonologist: Dr. Shelle Iron  HPI: Stacey Lin is a 49 yo female with a history of anemia, combined systolic/diastolic heart failure, HTN, and history of polysubstance abuse. Initial ECHO (04/2010) EF 50-55% with grd 1 DD, LVIDd 47mm, mild to mod MR, RA and LA mildly dilated.   Admitted to the hospital 08/19/13-08/29/13 with A/C systolic HF. Discharge weight 144 lbs.  She presents today for HF follow up.  Still smoking 2-3 cigarettes a day, some days doesn't smoke at all.  For 2 weeks, she has been more short of breath.  She has had a cough and orthopnea.  She generally feels "bad."  She is short of breath just walking around her house and is profoundly fatigued.  She is using Brio only as needed.  No chest pain.  Weight is up 5 lbs.    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 Mod MR and Mod-sev TR Echo (1/15): EF 45%, mild to moderate AI, moderate MR, RV normal size with mildly decreased systolic function.  CPX (5/15): FVC 66%, FEV1 59%, ratio 72%, peak VO2 15.7, slope 22.6 => mildly decreased functional capacity limited primarily by lungs.  Echo (05/2014): EF 55-60%, mild/mod AI, grade I DD and mild MR R/LHC (06/06/14): ectatic coronaries (suspect d/t HTN) no significant arthrosclerosis and normal EF. No significant MR noted Echo (7/16): EF 60-65%, moderate AI.   Labs (1/15): K 4.1, creatinine 0.96, hemoglobin 10.1 Labs (12/04/13) K 4.3 Creatinine 0.97 Labs (4/15) K+4.1, Creatinine 0.88 Labs (6/15) Hgb 10.9, K 3.8, creatinine 1.19 Labs (2/16) HCT 35.1, TSH normal Labs (5/16) K 3.3, creatinine 1.38 Labs (9/16): K 3.9, creatinine 1.24, HCT 38.3  SH: Living in [place] with boyfriend. No ETOH  or drug use. 2-3 cigs/day.   FH: Mother deceased: Dementia, seizures, stomach cancer        Father deceased: HTN  ROS: All systems negative except as listed in HPI, PMH and Problem List.  Past Medical History  Diagnosis Date  . Hypertension   . Anemia   . Arrhythmia   . Hypercholesteremia   . Chronic headaches   . Asthma   . Anginal pain (HCC)     2 months  . CHF (congestive heart failure) (HCC)     1990s  . COPD (chronic obstructive pulmonary disease) (HCC)     ??    Current Outpatient Prescriptions  Medication Sig Dispense Refill  . albuterol (PROAIR HFA) 108 (90 BASE) MCG/ACT inhaler Inhale 2 puffs into the lungs every 6 (six) hours as needed for wheezing or shortness of breath. 1 Inhaler 5  . Aspirin-Salicylamide-Caffeine (BC HEADACHE POWDER PO) Take 1 packet by mouth as needed (body aches).    Marland Kitchen atorvastatin (LIPITOR) 40 MG tablet Take 1 tablet (40 mg total) by mouth daily. 30 tablet 6  . carvedilol (COREG) 6.25 MG tablet TAKE 1 & 1/2 TABLETS BY MOUTH IN THE MORNING AND 2 TABLETS IN THE EVENING 105 tablet 6  . ferrous sulfate 325 (65 FE) MG tablet Take 325 mg by mouth daily with breakfast.    . Fluticasone Furoate-Vilanterol (BREO ELLIPTA) 100-25 MCG/INH AEPB Inhale 1 puff into the lungs 2 (two) times daily.    Marland Kitchen losartan (COZAAR) 25  MG tablet Take 0.5 tablets (12.5 mg total) by mouth daily. 15 tablet 6  . spironolactone (ALDACTONE) 25 MG tablet Take 1 tablet (25 mg total) by mouth every evening. 30 tablet 6  . tiotropium (SPIRIVA HANDIHALER) 18 MCG inhalation capsule Place 1 capsule (18 mcg total) into inhaler and inhale daily. 30 capsule 12  . oxyCODONE-acetaminophen (PERCOCET) 5-325 MG tablet Take 1-2 tablets by mouth every 4 (four) hours as needed for severe pain. (Patient not taking: Reported on 08/24/2015) 40 tablet 0  . potassium chloride SA (K-DUR,KLOR-CON) 20 MEQ tablet Take 1 tablet (20 mEq total) by mouth daily. 30 tablet 6  . torsemide (DEMADEX) 20 MG tablet Take  4 tablets (80 mg total) by mouth daily. 120 tablet 3   No current facility-administered medications for this encounter.   Filed Vitals:   08/24/15 1008  BP: 148/90  Pulse: 76  Weight: 195 lb 8 oz (88.678 kg)  SpO2: 97%   PHYSICAL EXAM: General:  Mildly dyspneic HEENT: normal Neck: supple. JVP 12.14 cm; Carotids 2+ bilaterally; no bruits. No lymphadenopathy or thryomegaly appreciated. Cor: PMI normal. Regular rate & rhythm. No rubs, gallops. 1/6 SEM RUSB (no diastolic murmur heard).  Lungs: distant lung sounds, no wheezing.  Abdomen: soft, tender, nondistended. No hepatosplenomegaly. No bruits or masses. Good bowel sounds. Extremities: no cyanosis, clubbing, rash. 1+ edema 1/2 to knees bilaterally. Neuro: alert & orientedx3, cranial nerves grossly intact. Moves all 4 extremities w/o difficulty. Affect pleasant.  ASSESSMENT & PLAN:  1) Chronic diastolic HF:  Nonischemic cardiomyopathy, on last echo LV function remained back to normal => EF 60-65%.  She is more symptomatic today (NYHA class III) with weight gain and volume overload on exam.  - I will give her Lasix 80 mg IV x 1 in clinic today.  I will have her stop po Lasix and start on torsemide 80 mg daily.   - BMET/BNP today, repeat BMET in 1 week.  2) Dyspnea: CPX in 5/15 suggested primarily a pulmonary limitation.  She has seen pulmonary, thought to only have mild COPD.  Dyspnea at that time was thought to be mainly due to obesity and deconditioning.  Today, I think that her symptoms are more due to volume overload. - She needs to use Brio every day rather than prn.   3) HTN: BP high today but has not had losartan, spironolactone, or Coreg.  Continue home regimen, needs to take meds when she gets home.  4) Nicotine abuse: Discussed the need to quit smoking.  5) Fatigue: Check TSH and CBC today.   [name] 08/24/2015

## 2015-08-24 NOTE — Progress Notes (Signed)
22 g IV started in R AC by Kyra Manges, RN.  Administered 80 mg IV Lasix and 20 meq PO KCL.  Pt tolerated well.

## 2015-08-24 NOTE — Progress Notes (Signed)
Advanced Heart Failure Medication Review by a Pharmacist  Does the patient  feel that his/her medications are working for him/her?  yes  Has the patient been experiencing any side effects to the medications prescribed?  no  Does the patient measure his/her own blood pressure or blood glucose at home?  no   Does the patient have any problems obtaining medications due to transportation or finances?   no  Understanding of regimen: good Understanding of indications: good Potential of compliance: poor Patient understands to avoid NSAIDs. Patient understands to avoid decongestants.  Issues to address at subsequent visits: Compliance    Pharmacist comments:  Ms. Pereyra is a pleasant 49 yo F presenting with her medication bottles. She admits to poor compliance with her medications 2/2 unstable living environment since she moves frequently. We talked about keeping her medications in her pocketbook to minimize the risk that they are forgotten at one of the various locations she has been staying. We talked about the importance of consistent use and she verbalized understanding. She did ask about whether or not she needed to continue taking iron and atorvastatin. Last iron panel and lipid panel were taken in 2014 and I don't see a history of CVA or ACS. Will defer these questions to Dr. Aundra Dubin. May need new labs to determine whether or not to continue these medications.   Ruta Hinds. Velva Harman, PharmD, BCPS, CPP Clinical Pharmacist Pager: [phone] Phone: [phone] 08/24/2015 10:33 AM      Time with patient: 8 minutes Preparation and documentation time: 2 minutes Total time: 10 minutes

## 2015-08-24 NOTE — Progress Notes (Signed)
Pt had 600 cc of urine output

## 2015-08-25 ENCOUNTER — Encounter (HOSPITAL_COMMUNITY): Payer: Medicaid Other | Admitting: Internal Medicine

## 2015-09-01 ENCOUNTER — Encounter (HOSPITAL_COMMUNITY): Payer: Self-pay | Admitting: Internal Medicine

## 2015-09-01 ENCOUNTER — Ambulatory Visit (HOSPITAL_COMMUNITY)
Admission: RE | Admit: 2015-09-01 | Discharge: 2015-09-01 | Disposition: A | Discharge status: Home / Self Care | Payer: Medicaid Other | Source: Ambulatory Visit | Attending: Internal Medicine | Admitting: Internal Medicine

## 2015-09-01 VITALS — BP 108/66 | HR 73 | Wt 185.5 lb

## 2015-09-01 DIAGNOSIS — R0600 Dyspnea, unspecified: Secondary | ICD-10-CM | POA: Insufficient documentation

## 2015-09-01 DIAGNOSIS — I5032 Chronic diastolic (congestive) heart failure: Secondary | ICD-10-CM | POA: Insufficient documentation

## 2015-09-01 DIAGNOSIS — Z720 Tobacco use: Secondary | ICD-10-CM | POA: Insufficient documentation

## 2015-09-01 DIAGNOSIS — I5022 Chronic systolic (congestive) heart failure: Secondary | ICD-10-CM

## 2015-09-01 DIAGNOSIS — I110 Hypertensive heart disease with heart failure: Secondary | ICD-10-CM | POA: Diagnosis not present

## 2015-09-01 DIAGNOSIS — R5383 Other fatigue: Secondary | ICD-10-CM | POA: Insufficient documentation

## 2015-09-01 LAB — BASIC METABOLIC PANEL
Anion gap: 11 (ref 5–15)
BUN: 32 mg/dL — AB (ref 6–20)
CHLORIDE: 100 mmol/L — AB (ref 101–111)
CO2: 27 mmol/L (ref 22–32)
CREATININE: 1.62 mg/dL — AB (ref 0.44–1.00)
Calcium: 9.8 mg/dL (ref 8.9–10.3)
GFR calc Af Amer: 41 mL/min — ABNORMAL LOW (ref >60)
GFR calc non Af Amer: 36 mL/min — ABNORMAL LOW (ref >60)
GLUCOSE: 138 mg/dL — AB (ref 65–99)
Potassium: 3.8 mmol/L (ref 3.5–5.1)
SODIUM: 138 mmol/L (ref 135–145)

## 2015-09-01 NOTE — Progress Notes (Signed)
Patient ID: Stacey Lin, female   DOB: 1965-11-23, 49 y.o.   MRN: [id number]   PCP: [facility] Pulmonologist: Dr. Shelle Iron  HPI: Stacey Lin is a 49 yo female with a history of anemia, combined systolic/diastolic heart failure, HTN, and history of polysubstance abuse. Initial ECHO (04/2010) EF 50-55% with grd 1 DD, LVIDd 47mm, mild to mod MR, RA and LA mildly dilated.   Admitted to the hospital 08/19/13-08/29/13 with A/C systolic HF. Discharge weight 144 lbs.  She presents today for HF follow up.  Last visit she was given IV lasix and changed from lasix to torsemide. Overall feeling better. Denies SOB/PND/Orthopnea. Does admit to dyspnea with inclines. Not weighing at home. Does not have a scale. Taking all medications. Tries to follow low salt but has limited income. Also had all teeth removed. Eating soft foods. Smoking 2-3 cigarettes per day.   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 Mod MR and Mod-sev TR Echo (1/15): EF 45%, mild to moderate AI, moderate MR, RV normal size with mildly decreased systolic function.  CPX (5/15): FVC 66%, FEV1 59%, ratio 72%, peak VO2 15.7, slope 22.6 => mildly decreased functional capacity limited primarily by lungs.  Echo (05/2014): EF 55-60%, mild/mod AI, grade I DD and mild MR R/LHC (06/06/14): ectatic coronaries (suspect d/t HTN) no significant arthrosclerosis and normal EF. No significant MR noted Echo (7/16): EF 60-65%, moderate AI.   Labs (1/15): K 4.1, creatinine 0.96, hemoglobin 10.1 Labs (12/04/13) K 4.3 Creatinine 0.97 Labs (4/15) K+4.1, Creatinine 0.88 Labs (6/15) Hgb 10.9, K 3.8, creatinine 1.19 Labs (2/16) HCT 35.1, TSH normal Labs (5/16) K 3.3, creatinine 1.38 Labs (9/16): K 3.9, creatinine 1.24, HCT 38.3 Labs (10/3/12016): K  3.9 Creatinine 0.98 TSH 1.846   SH: Living in [place] with boyfriend. No ETOH or drug use. 2-3 cigs/day.   FH: Mother deceased: Dementia, seizures, stomach cancer        Father deceased: HTN  ROS: All systems negative except as listed in HPI, PMH and Problem List.  Past Medical History  Diagnosis Date  . Hypertension   . Anemia   . Arrhythmia   . Hypercholesteremia   . Chronic headaches   . Asthma   . Anginal pain (HCC)     2 months  . CHF (congestive heart failure) (HCC)     1990s  . COPD (chronic obstructive pulmonary disease) (HCC)     ??    Current Outpatient Prescriptions  Medication Sig Dispense Refill  . albuterol (PROAIR HFA) 108 (90 BASE) MCG/ACT inhaler Inhale 2 puffs into the lungs every 6 (six) hours as needed for wheezing or shortness of breath. 1 Inhaler 5  . atorvastatin (LIPITOR) 40 MG tablet Take 1 tablet (40 mg total) by mouth daily. 30 tablet 6  . carvedilol (COREG) 6.25 MG tablet TAKE 1 & 1/2 TABLETS BY MOUTH IN THE MORNING AND 2 TABLETS IN THE EVENING 105 tablet 6  . ferrous sulfate 325 (65 FE) MG tablet Take 325 mg by mouth daily with breakfast.    . Fluticasone Furoate-Vilanterol (BREO ELLIPTA) 100-25 MCG/INH AEPB Inhale 1 puff into the lungs 2 (two) times daily.    Marland Kitchen losartan (COZAAR) 25 MG tablet Take 0.5 tablets (12.5 mg total) by mouth daily. 15 tablet 6  . potassium chloride SA (  K-DUR,KLOR-CON) 20 MEQ tablet Take 1 tablet (20 mEq total) by mouth daily. 30 tablet 6  . spironolactone (ALDACTONE) 25 MG tablet Take 1 tablet (25 mg total) by mouth every evening. 30 tablet 6  . tiotropium (SPIRIVA HANDIHALER) 18 MCG inhalation capsule Place 1 capsule (18 mcg total) into inhaler and inhale daily. 30 capsule 12  . torsemide (DEMADEX) 20 MG tablet Take 4 tablets (80 mg total) by mouth daily. 120 tablet 3  . oxyCODONE-acetaminophen (PERCOCET) 5-325 MG tablet Take 1-2 tablets by mouth every 4 (four) hours as needed for severe pain. (Patient not taking: Reported on  08/24/2015) 40 tablet 0   No current facility-administered medications for this encounter.   Filed Vitals:   09/01/15 0858  BP: 108/66  Pulse: 73  Weight: 185 lb 8 oz (84.142 kg)  SpO2: 98%   PHYSICAL EXAM: General:  NAD HEENT: normal Neck: supple. JVP 6-7  Carotids 2+ bilaterally; no bruits. No lymphadenopathy or thryomegaly appreciated. Cor: PMI normal. Regular rate & rhythm. No rubs, gallops. 1/6 SEM RUSB (no diastolic murmur heard).  Lungs: distant lung sounds, no wheezing.  Abdomen: soft, tender, nondistended. No hepatosplenomegaly. No bruits or masses. Good bowel sounds. Extremities: no cyanosis, clubbing, rash. No edema.  Neuro: alert & orientedx3, cranial nerves grossly intact. Moves all 4 extremities w/o difficulty. Affect pleasant.  ASSESSMENT & PLAN:  1) Chronic diastolic HF:  Nonischemic cardiomyopathy, on last echo LV function remained back to normal => EF 60-65%.  NYHA class II-III Volume status improved.  -Continue  torsemide 80 mg daily.   - BMET  2) Dyspnea: CPX in 5/15 suggested primarily a pulmonary limitation.  She has seen pulmonary, thought to only have mild COPD. Much improved as she is euvolemic.   3) HTN: Controlled.  4) Nicotine abuse: Discussed the need to quit smoking.  5) Fatigue: Check TSH and CBC today.  6) S/P multiple teeth extractions. -   Repeat ECHO  Follow up in 6-8 weeks  Amy [name] NP-C  09/01/2015  Patient seen and examined with Tonye Becket, NP. We discussed all aspects of the encounter. I agree with the assessment and plan as stated above.   She is much improved. Volume status looks good. Continue current meds. Recheck echo to ensure stability of LV function given recent decompensation.   Bensimhon, Daniel,MD 10:01 AM

## 2015-09-01 NOTE — Addendum Note (Signed)
Encounter addended by: Scarlette Calico, RN on: 09/01/2015 10:14 AM<BR>     Documentation filed: Dx Association, Patient Instructions Section, Orders

## 2015-09-01 NOTE — Patient Instructions (Signed)
Labs today  Your physician has requested that you have an echocardiogram. Echocardiography is a painless test that uses sound waves to create images of your heart. It provides your doctor with information about the size and shape of your heart and how well your heart's chambers and valves are working. This procedure takes approximately one hour. There are no restrictions for this procedure.  Your physician recommends that you schedule a follow-up appointment in: 6-8 weeks

## 2015-09-02 ENCOUNTER — Other Ambulatory Visit (HOSPITAL_COMMUNITY): Payer: Medicaid Other

## 2015-09-02 LAB — HEMOGLOBIN A1C
Hgb A1c MFr Bld: 5.7 % — ABNORMAL HIGH (ref 4.8–5.6)
Mean Plasma Glucose: 117 mg/dL

## 2015-09-04 ENCOUNTER — Telehealth (HOSPITAL_COMMUNITY): Payer: Self-pay | Admitting: *Deleted

## 2015-09-04 DIAGNOSIS — I5022 Chronic systolic (congestive) heart failure: Secondary | ICD-10-CM

## 2015-09-04 NOTE — Telephone Encounter (Signed)
-----   Message from Jolaine Artist, MD sent at 09/03/2015  4:12 PM EST ----- Creatinine up. HgbA1c normal. Repeat BMET next week.

## 2015-09-04 NOTE — Telephone Encounter (Signed)
Notes Recorded by Scarlette Calico, RN on 09/04/2015 at 1:54 PM Pt aware, labs sch for 11/15 at [facility]

## 2015-09-08 ENCOUNTER — Ambulatory Visit (HOSPITAL_COMMUNITY): Payer: Medicaid Other

## 2015-09-08 ENCOUNTER — Other Ambulatory Visit: Payer: Medicaid Other

## 2015-09-11 ENCOUNTER — Ambulatory Visit (INDEPENDENT_AMBULATORY_CARE_PROVIDER_SITE_OTHER): Payer: Medicaid Other | Admitting: Podiatry

## 2015-09-11 ENCOUNTER — Ambulatory Visit (INDEPENDENT_AMBULATORY_CARE_PROVIDER_SITE_OTHER): Payer: Medicaid Other

## 2015-09-11 VITALS — BP 138/86 | HR 94 | Resp 16 | Ht 60.0 in | Wt 183.0 lb

## 2015-09-11 DIAGNOSIS — M79671 Pain in right foot: Secondary | ICD-10-CM

## 2015-09-11 DIAGNOSIS — M722 Plantar fascial fibromatosis: Secondary | ICD-10-CM

## 2015-09-11 DIAGNOSIS — M79672 Pain in left foot: Principal | ICD-10-CM

## 2015-09-11 MED ORDER — TRIAMCINOLONE ACETONIDE 10 MG/ML IJ SUSP
10.0000 mg | Freq: Once | INTRAMUSCULAR | Status: AC
  Administered 2015-09-11: 10 mg

## 2015-09-11 NOTE — Progress Notes (Signed)
Subjective:    Patient ID: Stacey Lin, female    DOB: 1966-03-11, 49 y.o.   MRN: [id number]  HPI Patient presents with foot pain in their right foot, heel. Pt stated, "can't walk on or put pressure on"; x2 months.   Review of Systems  Respiratory: Positive for wheezing.   All other systems reviewed and are negative.      Objective:   Physical Exam        Assessment & Plan:

## 2015-09-11 NOTE — Patient Instructions (Signed)

## 2015-09-12 NOTE — Progress Notes (Signed)
Subjective:     Patient ID: Stacey Lin, female   DOB: [birth year], 49 y.o.   MRN: [id number]  HPI patient presents with intense discomfort on the plantar aspect of the right heel 2 months duration. Does not remember specific injury and also has dry skin   Review of Systems  All other systems reviewed and are negative.      Objective:   Physical Exam  Constitutional: She is oriented to person, place, and time.  Cardiovascular: Intact distal pulses.   Musculoskeletal: Normal range of motion.  Neurological: She is oriented to person, place, and time.  Skin: Skin is warm and dry.  Nursing note and vitals reviewed.  neurovascular status found to be intact muscle strength was adequate range of motion within normal limits with patient noted to have exquisite discomfort plantar aspect heel right at the insertion of the tendon into the calcaneus with moderate dry skin formation noted. Patient does have good digital perfusion and is well oriented 3     Assessment:     Plantar fasciitis acute nature right plantar heel with dermatitis-like condition    Plan:     H&P conditions reviewed and today I injected the right plantar fashion 3 mg Kenalog 5 mg Xylocaine and dispensed fascial brace with instructions on usage. Gave instructions for what to do for dry skin consisting of moisturizer Vaseline and reappoint again as needed over the next few weeks or if symptoms were to persist or get worse

## 2015-09-16 ENCOUNTER — Other Ambulatory Visit (INDEPENDENT_AMBULATORY_CARE_PROVIDER_SITE_OTHER): Payer: Medicaid Other | Admitting: *Deleted

## 2015-09-16 ENCOUNTER — Ambulatory Visit (HOSPITAL_COMMUNITY): Payer: Medicaid Other | Attending: Cardiology

## 2015-09-16 ENCOUNTER — Other Ambulatory Visit: Payer: Self-pay

## 2015-09-16 DIAGNOSIS — I5022 Chronic systolic (congestive) heart failure: Secondary | ICD-10-CM

## 2015-09-16 DIAGNOSIS — F17200 Nicotine dependence, unspecified, uncomplicated: Secondary | ICD-10-CM | POA: Insufficient documentation

## 2015-09-16 DIAGNOSIS — I10 Essential (primary) hypertension: Secondary | ICD-10-CM

## 2015-09-16 DIAGNOSIS — I517 Cardiomegaly: Secondary | ICD-10-CM | POA: Diagnosis not present

## 2015-09-16 DIAGNOSIS — E785 Hyperlipidemia, unspecified: Secondary | ICD-10-CM | POA: Insufficient documentation

## 2015-09-16 DIAGNOSIS — M25511 Pain in right shoulder: Secondary | ICD-10-CM | POA: Diagnosis not present

## 2015-09-16 LAB — BASIC METABOLIC PANEL
BUN: 40 mg/dL — ABNORMAL HIGH (ref 7–25)
CO2: 30 mmol/L (ref 20–31)
Calcium: 9.8 mg/dL (ref 8.6–10.4)
Chloride: 96 mmol/L — ABNORMAL LOW (ref 98–110)
Creat: 1.49 mg/dL — ABNORMAL HIGH (ref 0.50–1.05)
Glucose, Bld: 136 mg/dL — ABNORMAL HIGH (ref 65–99)
Potassium: 3.8 mmol/L (ref 3.5–5.3)
Sodium: 136 mmol/L (ref 135–146)

## 2015-09-16 NOTE — Addendum Note (Signed)
Addended by: Eulis Foster on: 09/16/2015 08:10 AM   Modules accepted: Orders

## 2015-09-16 NOTE — Addendum Note (Signed)
Addended by: Eulis Foster on: 09/16/2015 07:42 AM   Modules accepted: Orders

## 2015-09-16 NOTE — Addendum Note (Signed)
Addended by: Eulis Foster on: 09/16/2015 08:12 AM   Modules accepted: Orders

## 2015-09-21 ENCOUNTER — Encounter: Payer: Self-pay | Admitting: Neurology

## 2015-09-21 ENCOUNTER — Ambulatory Visit (INDEPENDENT_AMBULATORY_CARE_PROVIDER_SITE_OTHER): Payer: Medicaid Other | Admitting: Neurology

## 2015-09-21 VITALS — BP 100/60 | HR 79 | Ht 60.0 in | Wt 187.6 lb

## 2015-09-21 DIAGNOSIS — R202 Paresthesia of skin: Secondary | ICD-10-CM | POA: Diagnosis not present

## 2015-09-21 DIAGNOSIS — G5603 Carpal tunnel syndrome, bilateral upper limbs: Secondary | ICD-10-CM | POA: Diagnosis not present

## 2015-09-21 MED ORDER — AMBULATORY NON FORMULARY MEDICATION: Status: DC

## 2015-09-21 MED ORDER — GABAPENTIN 300 MG PO CAPS: ORAL_CAPSULE | ORAL | Status: DC

## 2015-09-21 NOTE — Patient Instructions (Addendum)
1.  Start gabapentin 300mg  at bedtime for one week, then increase to 1 tablet twice daily 2.  Start using a wrist splint.  You can purchase from any pharmacy or medical supply store 3.  Start wrapping your left elbow with a towel at bedtime to prevent elbow flexion 4.  Stop smoking 5.  NCS/EMG of the upper extremities 6.  Return to clinic 2-3 months

## 2015-09-21 NOTE — Progress Notes (Signed)
Note routed

## 2015-09-21 NOTE — Progress Notes (Signed)
[facility] Clinic Note - Initial Visit   Date: 09/21/2015  Stacey Lin MRN: [id number] DOB: 1966-01-09   Dear Dr. Ronne Binning:  Thank you for your kind referral of Stacey Lin for consultation of hand pain. Although her history is well known to you, please allow [place] to reiterate it for the purpose of our medical record. The patient was accompanied to the clinic by self.   History of Present Illness: Stacey Lin is a 49 y.o. right-handed African American female with CHF, asthma, hypertension, hyperlipidemia, obesity, low back pain,  presenting for evaluation of bilateral hand and arm pain.    Starting around summer of 2016, she began experiencing left hand pain radiating into her shoulder, described as tingling/numbness.  At nighttime, her right hand starts to fall asleep.  She tries shaking her hands to wake them up, but it does not help.  Heat provides some relief.  She has noticed that sleeping on her side with hands and arms flexed exacerbates symptoms.  Grip is weaker in both hands.  There is no sharp, shooting pain or neck discomfort.  She has not tried a wrist splint.  She denies any numbness/tingling in the feet.  She takes percocet for her feet and knee pain, which sometimes helps with her hands as well.   Out-side paper records, electronic medical record, and images have been reviewed where available and summarized as:  Lab Results  Component Value Date   TSH 1.846 08/24/2015   Lab Results  Component Value Date   HGBA1C 5.7* 09/01/2015   MRI lumbar spine wo contrast 05/06/2010: L3-4: Disc bulge but no neural compression.  L4-5: Annular tearing with shallow disc protrusion and slight caudal down turning. Mild narrowing of the lateral recesses. Definite neural compression is not established but neural irritation could occur.  L5-S1: Chronic disc degeneration with loss of height. Endplate osteophytes and bulging of the disc.  Narrowing of the lateral recesses that could irritate either S1 nerve root. Definite compression is not established however.  Past Medical History  Diagnosis Date  . Hypertension   . Anemia   . Arrhythmia   . Hypercholesteremia   . Chronic headaches   . Asthma   . Anginal pain (HCC)     2 months  . CHF (congestive heart failure) (HCC)     1990s  . COPD (chronic obstructive pulmonary disease) (HCC)     ??    Past Surgical History  Procedure Laterality Date  . Tubal ligation  1986  . Left and right heart catheterization with coronary angiogram N/A 06/06/2014    Procedure: LEFT AND RIGHT HEART CATHETERIZATION WITH CORONARY ANGIOGRAM;  Surgeon: Dolores Patty, MD;  Location: [facility] CATH LAB;  Service: Cardiovascular;  Laterality: N/A;  . Multiple extractions with alveoloplasty Bilateral 07/24/2015    Procedure: MULTIPLE EXTRACTIONS WITH ALVEOLOPLASTY,  BILATERAL TORI ;  Surgeon: Ocie Doyne, DDS;  Location: [facility] OR;  Service: Oral Surgery;  Laterality: Bilateral;     Medications:  Outpatient Encounter Prescriptions as of 09/21/2015  Medication Sig  . albuterol (PROAIR HFA) 108 (90 BASE) MCG/ACT inhaler Inhale 2 puffs into the lungs every 6 (six) hours as needed for wheezing or shortness of breath.  Marland Kitchen atorvastatin (LIPITOR) 40 MG tablet Take 1 tablet (40 mg total) by mouth daily.  . carvedilol (COREG) 6.25 MG tablet TAKE 1 & 1/2 TABLETS BY MOUTH IN THE MORNING AND 2 TABLETS IN THE EVENING  . ferrous sulfate 325 (65 FE) MG tablet Take 325  mg by mouth daily with breakfast.  . Fluticasone Furoate-Vilanterol (BREO ELLIPTA) 100-25 MCG/INH AEPB Inhale 1 puff into the lungs 2 (two) times daily.  Marland Kitchen losartan (COZAAR) 25 MG tablet Take 0.5 tablets (12.5 mg total) by mouth daily.  Marland Kitchen oxyCODONE-acetaminophen (PERCOCET) 5-325 MG tablet Take 1-2 tablets by mouth every 4 (four) hours as needed for severe pain.  . potassium chloride SA (K-DUR,KLOR-CON) 20 MEQ tablet Take 1 tablet (20 mEq total) by  mouth daily.  Marland Kitchen spironolactone (ALDACTONE) 25 MG tablet Take 1 tablet (25 mg total) by mouth every evening.  . tiotropium (SPIRIVA HANDIHALER) 18 MCG inhalation capsule Place 1 capsule (18 mcg total) into inhaler and inhale daily.  Marland Kitchen torsemide (DEMADEX) 20 MG tablet Take 4 tablets (80 mg total) by mouth daily.   No facility-administered encounter medications on file as of 09/21/2015.     Allergies: No Known Allergies  Family History: Family History  Problem Relation Age of Onset  . Hypertension Mother   . Hypertension Father   . Hypertension Maternal Grandmother   . Allergies Mother   . Asthma Mother   . Heart disease Mother     Social History: Social History  Substance Use Topics  . Smoking status: Current Some Day Smoker -- 0.10 packs/day for 20 years    Types: Cigarettes  . Smokeless tobacco: Never Used     Comment: Smokes occasionally when someone has a cig to have, doesn't buy them.  . Alcohol Use: No   Social History   Social History Narrative   Lives with son in an apartment on the third floor.  Does not work.  On disability.  Education: high school.      Review of Systems:  CONSTITUTIONAL: No fevers, chills, night sweats, or weight loss.   EYES: No visual changes or eye pain ENT: No hearing changes.  No history of nose bleeds.   RESPIRATORY: No cough, +wheezing and shortness of breath.   CARDIOVASCULAR: Negative for chest pain, and palpitations.   GI: Negative for abdominal discomfort, blood in stools or black stools.  No recent change in bowel habits.   GU:  No history of incontinence.   MUSCLOSKELETAL: +history of joint pain or swelling.  No myalgias.   SKIN: Negative for lesions, rash, and itching.   HEMATOLOGY/ONCOLOGY: Negative for prolonged bleeding, bruising easily, and swollen nodes.  No history of cancer.   ENDOCRINE: Negative for cold or heat intolerance, polydipsia or goiter.   PSYCH:  No depression or anxiety symptoms.   NEURO: As  Above.   Vital Signs:  BP 100/60 mmHg  Pulse 79  Ht 5' (1.524 m)  Wt 187 lb 9 oz (85.078 kg)  BMI 36.63 kg/m2  SpO2 99%   General Medical Exam:   General:  Well appearing, comfortable.   Eyes/ENT: see cranial nerve examination.   Neck: No masses appreciated.  Full range of motion without tenderness.  No carotid bruits. Respiratory:  Clear to auscultation, good air entry bilaterally.   Cardiac:  Regular rate and rhythm, no murmur.   Extremities:  No deformities, edema, or skin discoloration.  Skin:  No rashes or lesions.  Neurological Exam: MENTAL STATUS including orientation to time, place, person, recent and remote memory, attention span and concentration, language, and fund of knowledge is normal.  Speech is not dysarthric.  CRANIAL NERVES: II:  No visual field defects.  Unremarkable fundi.   III-IV-VI: Pupils equal round and reactive to light.  Normal conjugate, extra-ocular eye movements in all directions  of gaze.  No nystagmus.  No ptosis.   V:  Normal facial sensation.    VII:  Normal facial symmetry and movements.   VIII:  Normal hearing and vestibular function.   IX-X:  Normal palatal movement.   XI:  Normal shoulder shrug and head rotation.   XII:  Normal tongue strength and range of motion, no deviation or fasciculation.  MOTOR:  Mild bilateral ABP atrophy.  No fasciculations or abnormal movements.  No pronator drift.  Tone is normal.    Right Upper Extremity:    Left Upper Extremity:    Deltoid  5/5   Deltoid  5/5   Biceps  5/5   Biceps  5/5   Triceps  5/5   Triceps  5/5   Wrist extensors  5/5   Wrist extensors  5/5   Wrist flexors  5/5   Wrist flexors  5/5   Finger extensors  5-/5   Finger extensors  5/5   Finger flexors  5/5   Finger flexors  5/5   Dorsal interossei  4+/5   Dorsal interossei  4+/5   Abductor pollicis  4+/5   Abductor pollicis  4+/5   Tone (Ashworth scale)  0  Tone (Ashworth scale)  0   Right Lower Extremity:    Left Lower Extremity:    Hip  flexors  5/5   Hip flexors  5/5   Hip extensors  5/5   Hip extensors  5/5   Knee flexors  5/5   Knee flexors  5/5   Knee extensors  5/5   Knee extensors  5/5   Dorsiflexors  5/5   Dorsiflexors  5/5   Plantarflexors  5/5   Plantarflexors  5/5   Toe extensors  5/5   Toe extensors  5/5   Toe flexors  5/5   Toe flexors  5/5   Tone (Ashworth scale)  0  Tone (Ashworth scale)  0   MSRs:  Right                                                                 Left brachioradialis 2+  brachioradialis 2+  biceps 2+  biceps 2+  triceps 2+  triceps 2+  patellar 2+  patellar 2+  ankle jerk 2+  ankle jerk 2+  Hoffman no  Hoffman no  plantar response down  plantar response down   SENSORY:  Normal and symmetric perception of light touch, pinprick, vibration, and proprioception.  Tinel's positive bilaterally at the wrist.    COORDINATION/GAIT: Normal finger-to- nose-finger and heel-to-shin.  Intact rapid alternating movements bilaterally. Gait is antalgic due to right foot pain.    IMPRESSION/PLAN: 1.  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 Less likely radiculopathy given absence of radicular pain.  - NCS/EMG of the upper extremities will be performed to better localize her symptoms.   - Start gabapentin 300mg  at bedtime for one week, then increase to 1 tab twice daily - Start using a wrist splint and soft elbow pad.  Alternatively, she can wrap the left elbow in a towel to prevent hyperflexion at bedtime  2.  Tobacco user Smoking cessation instruction/counseling given:  counseled patient on  the dangers of tobacco use, advised patient to stop smoking, and reviewed strategies to maximize success   Return to clinic in 2-3 months.   The duration of this appointment visit was 40 minutes of face-to-face time with the patient.  Greater than 50% of this time was spent in counseling, explanation of  diagnosis, planning of further management, and coordination of care.   Thank you for allowing me to participate in patient's care.  If I can answer any additional questions, I would be pleased to do so.    Sincerely,    Idelle Reimann K. Allena Katz, DO

## 2015-09-24 ENCOUNTER — Telehealth: Payer: Self-pay | Admitting: *Deleted

## 2015-09-24 NOTE — Telephone Encounter (Signed)
Pt states she was seen 2 weeks ago, got a shot that about killed her and a bracelet for her heel, she states she hurts so bad she would like another appt.  I spoke with pt and asked her if she was icing the feet and she said not often, I told her to ice the feet 3-4 times a day for 10-15 minutes each time and transferred her to schedulers.

## 2015-09-25 ENCOUNTER — Other Ambulatory Visit: Payer: Self-pay | Admitting: *Deleted

## 2015-09-25 MED ORDER — GABAPENTIN 300 MG PO CAPS: ORAL_CAPSULE | ORAL | Status: DC

## 2015-10-08 ENCOUNTER — Ambulatory Visit: Payer: Medicaid Other | Admitting: Podiatry

## 2015-10-13 ENCOUNTER — Inpatient Hospital Stay (HOSPITAL_COMMUNITY): Admission: RE | Admit: 2015-10-13 | Payer: Medicaid Other | Source: Ambulatory Visit

## 2015-10-20 ENCOUNTER — Encounter: Payer: Medicaid Other | Admitting: Neurology

## 2015-11-02 ENCOUNTER — Ambulatory Visit: Payer: Medicaid Other | Admitting: Podiatry

## 2015-11-17 ENCOUNTER — Encounter: Payer: Medicaid Other | Admitting: Neurology

## 2015-11-24 ENCOUNTER — Encounter: Payer: Medicaid Other | Admitting: Neurology

## 2015-12-28 ENCOUNTER — Ambulatory Visit: Payer: Medicaid Other | Admitting: Neurology

## 2016-01-04 ENCOUNTER — Ambulatory Visit: Payer: Medicaid Other | Admitting: Neurology

## 2016-01-08 ENCOUNTER — Encounter: Payer: Self-pay | Admitting: Neurology

## 2016-01-11 NOTE — Progress Notes (Signed)
Patient ID: Stacey Lin, female   DOB: 02/03/1966, 50 y.o.   MRN: [id number]    Advanced Heart Failure Clinic Note   PCP: [facility] Pulmonologist: Dr. Shelle Iron  HPI: Ms. Rieff is a 50 yo female with a history of anemia, combined systolic/diastolic heart failure, HTN, and history of polysubstance abuse. Initial ECHO (04/2010) EF 50-55% with grd 1 DD, LVIDd 47mm, mild to mod MR, RA and LA mildly dilated.   Admitted to the hospital 08/19/13-08/29/13 with A/C systolic HF. Discharge weight 144 lbs.  She presents today for HF follow up. Has not been taking any of her HF medicines for several months. Down 5 lbs since last visit. Has been watching what she eats. Says she lost it in a move to another house and felt better so never went back for it.  Hasn't needed any torsemide. She does not weight daily.  She does have DOE going up steps or changing clothes/showering.  Still smoking ~2 cigarettes per day. Doesn't buy cigarettes, only bums one off of people around her.   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 Mod MR and Mod-sev TR Echo (1/15): EF 45%, mild to moderate AI, moderate MR, RV normal size with mildly decreased systolic function.  CPX (5/15): FVC 66%, FEV1 59%, ratio 72%, peak VO2 15.7, slope 22.6 => mildly decreased functional capacity limited primarily by lungs.  Echo (05/2014): EF 55-60%, mild/mod AI, grade I DD and mild MR R/LHC (06/06/14): ectatic coronaries (suspect d/t HTN) no significant arthrosclerosis and normal EF. No significant MR noted Echo (7/16): EF 60-65%, moderate AI.  Echo (11/16): EF 55%, Mild LAE  Labs (1/15): K 4.1, creatinine 0.96, hemoglobin 10.1 Labs (12/04/13) K 4.3 Creatinine 0.97 Labs (4/15) K+4.1, Creatinine 0.88 Labs (6/15) Hgb 10.9, K 3.8,  creatinine 1.19 Labs (2/16) HCT 35.1, TSH normal Labs (5/16) K 3.3, creatinine 1.38 Labs (9/16): K 3.9, creatinine 1.24, HCT 38.3 Labs (10/16): K 3.9 Creatinine 0.98 TSH 1.846  Labs (11/16): K 3.8 , Creatinine 1.49  SH: Living in [place] boyfriend. No ETOH or drug use. 2-3 cigs/day.   FH: Mother deceased: Dementia, seizures, stomach cancer        Father deceased: HTN  ROS: All systems negative except as listed in HPI, PMH and Problem List.  Past Medical History  Diagnosis Date  . Hypertension   . Anemia   . Arrhythmia   . Hypercholesteremia   . Chronic headaches   . Asthma   . Anginal pain (HCC)     2 months  . CHF (congestive heart failure) (HCC)     1990s  . COPD (chronic obstructive pulmonary disease) (HCC)     ??    Current Outpatient Prescriptions  Medication Sig Dispense Refill  . acetaminophen (TYLENOL) 500 MG tablet Take 500 mg by mouth every 6 (six) hours as needed.    Marland Kitchen albuterol (PROAIR HFA) 108 (90 BASE) MCG/ACT inhaler Inhale 2 puffs into the lungs every 6 (six) hours as needed for wheezing or shortness of breath. 1 Inhaler 5  . oxyCODONE-acetaminophen (PERCOCET) 5-325 MG tablet Take 1-2 tablets by mouth every 4 (four) hours as needed for severe pain. 40 tablet 0  . Fluticasone Furoate-Vilanterol (BREO ELLIPTA) 100-25 MCG/INH AEPB Inhale 1 puff into the lungs 2 (two) times daily. Reported on  01/12/2016     No current facility-administered medications for this encounter.   Filed Vitals:   01/12/16 1015  BP: 154/106  Pulse: 92  Weight: 180 lb 6.4 oz (81.829 kg)  SpO2: 100%   Wt Readings from Last 3 Encounters:  01/12/16 180 lb 6.4 oz (81.829 kg)  09/21/15 187 lb 9 oz (85.078 kg)  09/11/15 183 lb (83.008 kg)     PHYSICAL EXAM: General:  NAD HEENT: normal Neck: supple. JVP 6-7  Carotids 2+ bilaterally; no bruits. No thyromegaly or nodule noted. Cor: PMI normal. RRR. No rubs, gallops. 1/6 SEM RUSB (no diastolic murmur heard).  Lungs: distant lung  sounds, no wheezing.  Abdomen: soft, NT, ND, no HSM. No bruits or masses. +BS  Extremities: no cyanosis, clubbing, rash. No edema.  Neuro: alert & orientedx3, cranial nerves grossly intact. Moves all 4 extremities w/o difficulty. Affect pleasant.  ASSESSMENT & PLAN:  1) Chronic diastolic HF:  Nonischemic cardiomyopathy, on last echo LV function remained back to normal => EF 55% 09/16/2015.  NYHA class III, worsened from Class II now that she is of of her meds.  - Volume status stable despite no HF meds, has been watching diet and fluid intake.  - She has not taken any HF meds in 2-3 months. She has previously had rEF that improved with medications. Will resume medications with worsened functional status per patient and check echo in several months once back on meds.  - Will add back coreg 3.125 mg BID, losartan 12.5 mg daily. - Can use torsemide 40 mg as needed for swelling or weight gain 3 lbs overnight or 5 lbs  - Check BMET/BNP today and repeat BMET x 2 weeks. 2) Dyspnea: CPX in 5/15 suggested primarily a pulmonary limitation.  She has seen pulmonary, thought to only have mild COPD. - Has worsened in past several months with medical non-compliance, resuming meds as above.  3) HTN: Elevated today.  Resuming meds as above.  4) Nicotine abuse: Continue to encourage to stop smoking.  5) S/P multiple teeth extractions.  6) Medical Non-compliance - Encouraged her to remain on HF meds.   BMET/BNP today. Repeat BMET 2 weeks with med resumption. Follow up 1 months. Med changes as above.   Mariam Dollar Rehema Muffley PA-C  01/12/2016

## 2016-01-12 ENCOUNTER — Ambulatory Visit (HOSPITAL_COMMUNITY)
Admission: RE | Admit: 2016-01-12 | Discharge: 2016-01-12 | Disposition: A | Discharge status: Home / Self Care | Payer: Medicaid Other | Source: Ambulatory Visit | Attending: Cardiology | Admitting: Cardiology

## 2016-01-12 VITALS — BP 154/106 | HR 92 | Wt 180.4 lb

## 2016-01-12 DIAGNOSIS — I5042 Chronic combined systolic (congestive) and diastolic (congestive) heart failure: Secondary | ICD-10-CM | POA: Diagnosis not present

## 2016-01-12 DIAGNOSIS — R0600 Dyspnea, unspecified: Secondary | ICD-10-CM | POA: Diagnosis not present

## 2016-01-12 DIAGNOSIS — I110 Hypertensive heart disease with heart failure: Secondary | ICD-10-CM | POA: Insufficient documentation

## 2016-01-12 DIAGNOSIS — I10 Essential (primary) hypertension: Secondary | ICD-10-CM

## 2016-01-12 DIAGNOSIS — F17200 Nicotine dependence, unspecified, uncomplicated: Secondary | ICD-10-CM | POA: Diagnosis not present

## 2016-01-12 DIAGNOSIS — Z9114 Patient's other noncompliance with medication regimen: Secondary | ICD-10-CM

## 2016-01-12 LAB — BRAIN NATRIURETIC PEPTIDE: B NATRIURETIC PEPTIDE 5: 137.6 pg/mL — AB (ref 0.0–100.0)

## 2016-01-12 LAB — BASIC METABOLIC PANEL
Anion gap: 9 (ref 5–15)
BUN: 13 mg/dL (ref 6–20)
CALCIUM: 9.7 mg/dL (ref 8.9–10.3)
CHLORIDE: 108 mmol/L (ref 101–111)
CO2: 25 mmol/L (ref 22–32)
CREATININE: 1.07 mg/dL — AB (ref 0.44–1.00)
GFR calc non Af Amer: 59 mL/min — ABNORMAL LOW (ref >60)
Glucose, Bld: 107 mg/dL — ABNORMAL HIGH (ref 65–99)
Potassium: 4.1 mmol/L (ref 3.5–5.1)
SODIUM: 142 mmol/L (ref 135–145)

## 2016-01-12 MED ORDER — CARVEDILOL 3.125 MG PO TABS
3.1250 mg | ORAL_TABLET | Freq: Two times a day (BID) | ORAL | Status: DC
Start: 2016-01-12 — End: 2016-04-08

## 2016-01-12 MED ORDER — TORSEMIDE 20 MG PO TABS: 40.0000 mg | ORAL_TABLET | ORAL | Status: DC | PRN

## 2016-01-12 MED ORDER — LOSARTAN POTASSIUM 25 MG PO TABS
12.5000 mg | ORAL_TABLET | Freq: Every day | ORAL | Status: DC
Start: 2016-01-12 — End: 2016-02-11

## 2016-01-12 MED ORDER — POTASSIUM CHLORIDE ER 10 MEQ PO TBCR
20.0000 meq | EXTENDED_RELEASE_TABLET | ORAL | Status: DC | PRN
Start: 2016-01-12 — End: 2016-04-08

## 2016-01-12 MED FILL — TORSEMIDE 20 MG TABLET: 20 | 30 days supply | Qty: 60 | Fill #0

## 2016-01-12 MED FILL — CARVEDILOL 3.125 MG TABLET: 3.125 | 30 days supply | Qty: 60 | Fill #0

## 2016-01-12 MED FILL — POTASSIUM CL ER 10 MEQ TAB: 10 | 30 days supply | Qty: 60 | Fill #0

## 2016-01-12 MED FILL — LOSARTAN POTASSIUM 25 MG TA: 25 | 30 days supply | Qty: 15 | Fill #0

## 2016-01-12 NOTE — Progress Notes (Signed)
Advanced Heart Failure Medication Review by a Pharmacist  Does the patient  feel that his/her medications are working for him/her?  no  Has the patient been experiencing any side effects to the medications prescribed?  no  Does the patient measure his/her own blood pressure or blood glucose at home?  yes  Does the patient have any problems obtaining medications due to transportation or finances?   no  Understanding of regimen: poor Understanding of indications: poor Potential of compliance: poor Patient understands to avoid NSAIDs. Patient understands to avoid decongestants.  Issues to address at subsequent visits: Adherence   Pharmacist comments: 50 YO pleasant female presenting to HF clinic for followup. Pt reports that she stopped taking all oral medications 2-3 months ago except for percocet PRN.  Pt does report taking albuterol PRN and Breo Ellipta.  Pt denies fluid overload since stopping her diuretics.    Time with patient: 5 min  Preparation and documentation time: 5 min  Total time: 10 min

## 2016-01-12 NOTE — Patient Instructions (Signed)
START Losartan 12.5 mg, one half tab daily START Coreg 3.125 mg, one tab twice a day START Torsemide 40 mg(2 tabs) daily as needed for weight gain of 3 lbs overnight/ 5 lbs in a week/ increased SOB START Potassium 20 meq one tab as needed whenever you use torsemide  Labs today and again in 2 weeks  Your physician recommends that you schedule a follow-up appointment in: 1 month in In the [place] the following things EVERYDAY: 1) Weigh yourself in the morning before breakfast. Write it down and keep it in a log. 2) Take your medicines as prescribed 3) Eat low salt foods-Limit salt (sodium) to 2000 mg per day.  4) Stay as active as you can everyday 5) Limit all fluids for the day to less than 2 liters 6)

## 2016-01-21 ENCOUNTER — Ambulatory Visit: Payer: Medicaid Other | Admitting: Neurology

## 2016-01-25 ENCOUNTER — Telehealth: Payer: Self-pay | Admitting: Neurology

## 2016-01-25 NOTE — Telephone Encounter (Signed)
Patient dismissed from [facility] by Narda Amber DO , effective January 08, 2016. Dismissal letter sent out by certified / registered mail.  [facility]  Certified dismissal letter returned as undeliverable, unclaimed, return to sender after three attempts by [employer]PS due to change of address. Resend Certified Letter to updated address entered in patient's chart. Returned Certified mail scanned under [employer] tab. DAJ 04/14/16

## 2016-01-29 ENCOUNTER — Ambulatory Visit (HOSPITAL_COMMUNITY)
Admission: RE | Admit: 2016-01-29 | Discharge: 2016-01-29 | Disposition: A | Discharge status: Home / Self Care | Payer: Medicare Other | Source: Ambulatory Visit | Attending: Cardiology | Admitting: Cardiology

## 2016-01-29 DIAGNOSIS — I5042 Chronic combined systolic (congestive) and diastolic (congestive) heart failure: Secondary | ICD-10-CM | POA: Insufficient documentation

## 2016-01-29 LAB — BASIC METABOLIC PANEL
ANION GAP: 9 (ref 5–15)
BUN: 11 mg/dL (ref 6–20)
CALCIUM: 9.4 mg/dL (ref 8.9–10.3)
CHLORIDE: 109 mmol/L (ref 101–111)
CO2: 27 mmol/L (ref 22–32)
CREATININE: 1.02 mg/dL — AB (ref 0.44–1.00)
GFR calc non Af Amer: >60 mL/min (ref >60)
Glucose, Bld: 119 mg/dL — ABNORMAL HIGH (ref 65–99)
Potassium: 3.4 mmol/L — ABNORMAL LOW (ref 3.5–5.1)
SODIUM: 145 mmol/L (ref 135–145)

## 2016-02-10 NOTE — Progress Notes (Signed)
Patient ID: Stacey Lin, female   DOB: 1966-08-24, 50 y.o.   MRN: [id number]    Advanced Heart Failure Clinic Note   PCP: [facility] Pulmonologist: Dr. Shelle Iron  HPI: Stacey Lin is a 50 yo female with a history of anemia, combined systolic/diastolic heart failure, HTN, and history of polysubstance abuse. Initial ECHO (04/2010) EF 50-55% with grd 1 DD, LVIDd 47mm, mild to mod MR, RA and LA mildly dilated.   Admitted to the hospital 08/19/13-08/29/13 with A/C systolic HF. Discharge weight 144 lbs.  She presents today for HF follow up. At last visit started back on coreg and losartan. Still feeling SOB.  SOB with minimal activity. Weight down 2 lbs from last visit.  Has been watching fluids and diet. Only taking torsemide as needed. Has only taken once since last visit. Not weighing daily, states she doesn't have a scale, but is moving into an apartment and getting a scale tomorrow. Still has DOE going up steps or changing clothes/showering.  Still smoking ~2-5 cigarettes per day. Ran out of her Breo-Ellipta.  Hasn't seen Pulmonary care in over a year.    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 Mod MR and Mod-sev TR Echo (1/15): EF 45%, mild to moderate AI, moderate MR, RV normal size with mildly decreased systolic function.  CPX (5/15): FVC 66%, FEV1 59%, ratio 72%, peak VO2 15.7, slope 22.6 => mildly decreased functional capacity limited primarily by lungs.  Echo (05/2014): EF 55-60%, mild/mod AI, grade I DD and mild MR R/LHC (06/06/14): ectatic coronaries (suspect d/t HTN) no significant arthrosclerosis and normal EF. No significant MR noted Echo (7/16): EF 60-65%, moderate AI.  Echo (11/16): EF 55%, Mild LAE  Labs (1/15): K 4.1, creatinine 0.96, hemoglobin 10.1 Labs  (12/04/13) K 4.3 Creatinine 0.97 Labs (4/15) K+4.1, Creatinine 0.88 Labs (6/15) Hgb 10.9, K 3.8, creatinine 1.19 Labs (2/16) HCT 35.1, TSH normal Labs (5/16) K 3.3, creatinine 1.38 Labs (9/16): K 3.9, creatinine 1.24, HCT 38.3 Labs (10/16): K 3.9 Creatinine 0.98 TSH 1.846  Labs (11/16): K 3.8 , Creatinine 1.49  SH: Living in [place] boyfriend. No ETOH or drug use. 2-3 cigs/day.   FH: Mother deceased: Dementia, seizures, stomach cancer        Father deceased: HTN  ROS: All systems negative except as listed in HPI, PMH and Problem List.  Past Medical History  Diagnosis Date  . Hypertension   . Anemia   . Arrhythmia   . Hypercholesteremia   . Chronic headaches   . Asthma   . Anginal pain (HCC)     2 months  . CHF (congestive heart failure) (HCC)     1990s  . COPD (chronic obstructive pulmonary disease) (HCC)     ??    Current Outpatient Prescriptions  Medication Sig Dispense Refill  . acetaminophen (TYLENOL) 500 MG tablet Take 500 mg by mouth every 6 (six) hours as needed.    Marland Kitchen albuterol (PROAIR HFA) 108 (90 BASE) MCG/ACT inhaler Inhale 2 puffs into the lungs every 6 (six) hours as needed for wheezing or shortness of breath. 1 Inhaler 5  . carvedilol (COREG) 3.125 MG tablet Take 1 tablet (3.125 mg total) by mouth 2 (two) times daily with a meal. 60 tablet 3  . Fluticasone Furoate-Vilanterol (BREO ELLIPTA) 100-25 MCG/INH  AEPB Inhale 1 puff into the lungs 2 (two) times daily. Reported on 01/12/2016    . losartan (COZAAR) 25 MG tablet Take 0.5 tablets (12.5 mg total) by mouth daily. 15 tablet 6  . oxyCODONE-acetaminophen (PERCOCET) 5-325 MG tablet Take 1-2 tablets by mouth every 4 (four) hours as needed for severe pain. 40 tablet 0  . potassium chloride (K-DUR) 10 MEQ tablet Take 2 tablets (20 mEq total) by mouth as needed (When you take a torsemide). 60 tablet 3  . torsemide (DEMADEX) 20 MG tablet Take 2 tablets (40 mg total) by mouth as needed (as need for 3lbs weight gain  overnight/ 5lb in a week/ SOB). 60 tablet 6   No current facility-administered medications for this encounter.   Filed Vitals:   02/11/16 1119  BP: 178/110  Pulse: 78  Weight: 178 lb (80.74 kg)  SpO2: 98%   Wt Readings from Last 3 Encounters:  02/11/16 178 lb (80.74 kg)  01/12/16 180 lb 6.4 oz (81.829 kg)  09/21/15 187 lb 9 oz (85.078 kg)     PHYSICAL EXAM: General:  NAD, Slightly increased WOB with long sentences. HEENT: normal Neck: supple. JVP 5-6  Carotids 2+ bilaterally; no bruits. No thyromegaly or nodule noted. Cor: PMI normal. RRR. No rubs, gallops. 1/6 SEM RUSB (no diastolic murmur appreciated).  Lungs: Lung sounds diminished, scant wheezes.  Abdomen: soft, non-tender, non-distended, no HSM. No bruits or masses. +BS  Extremities: no cyanosis, clubbing, rash. No edema.  Neuro: alert & orientedx3, cranial nerves grossly intact. Moves all 4 extremities w/o difficulty. Affect pleasant.  ASSESSMENT & PLAN:  1) Chronic diastolic HF:  Nonischemic cardiomyopathy, on last echo LV function remained back to normal => EF 55% 09/16/2015.  NYHA class III-IIIb  - She is not volume overloaded on exam and has run out of her COPD meds.  Suspect this is primary cause of her symptoms.  - Continue coreg 3.125 mg BID - Increase losartan 25 mg daily. BMET today and will follow up in 2 weeks with recheck at that time.  - Can use torsemide 40 mg as needed for swelling or weight gain 3 lbs overnight or 5 lbs  - Check BMET today and repeat BMET x 2 weeks. 2) Dyspnea: CPX in 5/15 suggested primarily a pulmonary limitation.  She has seen pulmonary, thought to only have mild COPD. - Has worsened in past several months with medical non-compliance, and she is out of her breo and lost her pro-air.  Recommended she follow up with pulmonary ASAP.  - Can repeat CPX if getting back on pulmonary regimen does not improve her symptoms.  3) HTN: Elevated today.  Resuming meds as above.  4) Nicotine abuse:  -  Continues to smoke 2-5 cigarettes a day.   Encouraged to stop.  5) S/P multiple teeth extractions.  6) Medical Non-compliance - Encouraged her to remain on HF meds. She seems to be doing better.   BMET today. Repeat BMET 2 weeks at follow up. Med changes as above.   Mariam Dollar Tillery PA-C  02/11/2016

## 2016-02-11 ENCOUNTER — Ambulatory Visit (HOSPITAL_COMMUNITY)
Admission: RE | Admit: 2016-02-11 | Discharge: 2016-02-11 | Disposition: A | Discharge status: Home / Self Care | Payer: Medicare Other | Source: Ambulatory Visit | Attending: Internal Medicine | Admitting: Internal Medicine

## 2016-02-11 VITALS — BP 178/110 | HR 78 | Wt 178.0 lb

## 2016-02-11 DIAGNOSIS — I428 Other cardiomyopathies: Secondary | ICD-10-CM | POA: Diagnosis not present

## 2016-02-11 DIAGNOSIS — F17200 Nicotine dependence, unspecified, uncomplicated: Secondary | ICD-10-CM | POA: Diagnosis not present

## 2016-02-11 DIAGNOSIS — I10 Essential (primary) hypertension: Secondary | ICD-10-CM

## 2016-02-11 DIAGNOSIS — Z9119 Patient's noncompliance with other medical treatment and regimen: Secondary | ICD-10-CM | POA: Insufficient documentation

## 2016-02-11 DIAGNOSIS — I110 Hypertensive heart disease with heart failure: Secondary | ICD-10-CM | POA: Diagnosis not present

## 2016-02-11 DIAGNOSIS — E7800 Pure hypercholesterolemia, unspecified: Secondary | ICD-10-CM | POA: Diagnosis not present

## 2016-02-11 DIAGNOSIS — I5032 Chronic diastolic (congestive) heart failure: Secondary | ICD-10-CM | POA: Insufficient documentation

## 2016-02-11 DIAGNOSIS — Z8249 Family history of ischemic heart disease and other diseases of the circulatory system: Secondary | ICD-10-CM | POA: Insufficient documentation

## 2016-02-11 DIAGNOSIS — J449 Chronic obstructive pulmonary disease, unspecified: Secondary | ICD-10-CM | POA: Insufficient documentation

## 2016-02-11 DIAGNOSIS — I5042 Chronic combined systolic (congestive) and diastolic (congestive) heart failure: Secondary | ICD-10-CM

## 2016-02-11 DIAGNOSIS — F17210 Nicotine dependence, cigarettes, uncomplicated: Secondary | ICD-10-CM | POA: Insufficient documentation

## 2016-02-11 DIAGNOSIS — R0600 Dyspnea, unspecified: Secondary | ICD-10-CM

## 2016-02-11 DIAGNOSIS — Z79899 Other long term (current) drug therapy: Secondary | ICD-10-CM | POA: Insufficient documentation

## 2016-02-11 LAB — BASIC METABOLIC PANEL
ANION GAP: 10 (ref 5–15)
BUN: 10 mg/dL (ref 6–20)
CHLORIDE: 108 mmol/L (ref 101–111)
CO2: 25 mmol/L (ref 22–32)
Calcium: 9.6 mg/dL (ref 8.9–10.3)
Creatinine, Ser: 0.96 mg/dL (ref 0.44–1.00)
Glucose, Bld: 100 mg/dL — ABNORMAL HIGH (ref 65–99)
POTASSIUM: 3.9 mmol/L (ref 3.5–5.1)
SODIUM: 143 mmol/L (ref 135–145)

## 2016-02-11 MED ORDER — LOSARTAN POTASSIUM 25 MG PO TABS: 25.0000 mg | ORAL_TABLET | Freq: Every day | ORAL | Status: DC

## 2016-02-11 MED FILL — LOSARTAN POTASSIUM 25 MG TA: 25 | 30 days supply | Qty: 30 | Fill #0

## 2016-02-11 NOTE — Progress Notes (Signed)
Advanced Heart Failure Medication Review by a Pharmacist  Does the patient  feel that his/her medications are working for him/her?  yes  Has the patient been experiencing any side effects to the medications prescribed?  no  Does the patient measure his/her own blood pressure or blood glucose at home?  no   Does the patient have any problems obtaining medications due to transportation or finances?   no  Understanding of regimen: fair Understanding of indications: fair Potential of compliance: fair Patient understands to avoid NSAIDs. Patient understands to avoid decongestants.  Issues to address at subsequent visits: Verify diet pill being used   Pharmacist comments: All morning doses taken prior to visit today. Recently restarted on HF medications last visit. Had not taken because she claimed she felt better off them. Has not been weighing at home. Confused on how to properly take diuretic/potassium. Instructed patient to take two tablets of torsemide 20mg  along with two tablets K-Dur 20 mEq when >3lb wt gain overnight OR >5lb wt gain in one week. Last time used 4/8 and 4/9. Discussed s/sx of fluid overload that would require diuresis. Verbalizes understanding. Also reports recently starting a diet pill ordered via internet. Pt unsure of name; states it is an appetite suppressant/detox. Told to bring meds next visit. States she will call clinic today to provide name. All medication issues addressed    Time with patient: 10 min Preparation and documentation time: 5 min Total time: 15 min  Stephens November, PharmD Clinical Pharmacy Resident 11:53 AM, 02/11/2016

## 2016-02-11 NOTE — Patient Instructions (Signed)
Labs today  INCREASE Losartan to 25 mg daily  Your physician recommends that you schedule a follow-up appointment in: 2 weeks In the [place] following things EVERYDAY: 1) Weigh yourself in the morning before breakfast. Write it down and keep it in a log. 2) Take your medicines as prescribed 3) Eat low salt foods-Limit salt (sodium) to 2000 mg per day.  4) Stay as active as you can everyday 5) Limit all fluids for the day to less than 2 liters 6)

## 2016-02-12 ENCOUNTER — Ambulatory Visit (INDEPENDENT_AMBULATORY_CARE_PROVIDER_SITE_OTHER): Payer: Medicare Other | Admitting: Acute Care

## 2016-02-12 ENCOUNTER — Encounter: Payer: Self-pay | Admitting: Acute Care

## 2016-02-12 VITALS — BP 140/84 | HR 93 | Ht 60.0 in | Wt 178.0 lb

## 2016-02-12 DIAGNOSIS — J4530 Mild persistent asthma, uncomplicated: Secondary | ICD-10-CM

## 2016-02-12 MED ORDER — FLUTICASONE FUROATE-VILANTEROL 100-25 MCG/INH IN AEPB: 1.0000 | INHALATION_SPRAY | Freq: Two times a day (BID) | RESPIRATORY_TRACT | Status: DC

## 2016-02-12 MED ORDER — ALBUTEROL SULFATE HFA 108 (90 BASE) MCG/ACT IN AERS: 2.0000 | INHALATION_SPRAY | Freq: Four times a day (QID) | RESPIRATORY_TRACT | Status: DC | PRN

## 2016-02-12 NOTE — Assessment & Plan Note (Addendum)
Continued dyspnea She continues to smoke Not taking Breo as maintenance every day, but just when needed.  Plan: Patient educated re: maintenance vs. Rescue medication We will give you a Breo sample and new prescription for your Breo.( Maintenance inhaler) Use the Breo 1 puff twice daily without fail. We will give you a prescription for your Pro Air Inhaler ( Rescue Inhaler) Use this every 6 hours as needed for SOB or Wheezing. Claritin ( loratadine) 1 every day for allergies Nasal Saline as needed for nasal congestion Continue weighing yourself daily per the heart failure clinic Follow up in 1 month. ( Dr. Gwenette Greet patient needs to be reassigned) If no improvement after using maintenance med as prescribed, consider PFT's. Please contact office for sooner follow up if symptoms do not improve or worsen or seek emergency care

## 2016-02-12 NOTE — Progress Notes (Signed)
Subjective:    Patient ID: Stacey Lin, female    DOB: 1966-09-24, 50 y.o.   MRN: [id number]  HPI  50 year old female smoker previously seen by Dr. Shelle Iron with known dyspnea on exertion.She was felt to have asthmatic bronchitis but not COPD.She was started  on Breo for airway inflammation and asked to work on smoking cessation. She is on an ACE inhibitor.Last office visit Feb. 2016.  02/12/2016: Follow up Office Visit: Patient presents to the office today with worsening dyspnea on exertion. She was seen at the heart failure clinic yesterday, and they requested that she follow up with pulmonary. Spirometry 11/2014 shows normal FEV1, she probably does have some air trapping given her FVC which is also reduced from centripetal obesity. She is continuing to smoke approx. 2 cigarettes daily per her history.We discussed that she needs to quit smoking completely. Dr. Shelle Iron had put her on Breo 11/2014. He felt she would not need the inhaled medication if she could stop smoking. He felt her dyspnea was multifactorial due to her weight and deconditioning vs her minimal airway disease. When discussing medications it was evident  she has not been using her Breo inhaler daily as  Maintenance medication, but just when needed. We did re-education re: maintenance medications vs. Rescue medications today in the office. We will have a short term follow up to evaluate if daily maintenance helps, if not we may need to repeat PFT's. She denies fever, cough, orthopnea, hemoptysis, chest pain, or calf or leg pain. She needs prescription refills.   Current outpatient prescriptions:  .  acetaminophen (TYLENOL) 500 MG tablet, Take 500 mg by mouth every 6 (six) hours as needed., Disp: , Rfl:  .  albuterol (PROAIR HFA) 108 (90 Base) MCG/ACT inhaler, Inhale 2 puffs into the lungs every 6 (six) hours as needed for wheezing or shortness of breath., Disp: 1 Inhaler, Rfl: 5 .  carvedilol (COREG) 3.125 MG tablet, Take 1 tablet  (3.125 mg total) by mouth 2 (two) times daily with a meal., Disp: 60 tablet, Rfl: 3 .  fluticasone furoate-vilanterol (BREO ELLIPTA) 100-25 MCG/INH AEPB, Inhale 1 puff into the lungs 2 (two) times daily. Reported on 01/12/2016, Disp: 1 each, Rfl: 4 .  losartan (COZAAR) 25 MG tablet, Take 1 tablet (25 mg total) by mouth daily., Disp: 30 tablet, Rfl: 6 .  oxyCODONE-acetaminophen (PERCOCET) 5-325 MG tablet, Take 1-2 tablets by mouth every 4 (four) hours as needed for severe pain., Disp: 40 tablet, Rfl: 0 .  potassium chloride (K-DUR) 10 MEQ tablet, Take 2 tablets (20 mEq total) by mouth as needed (When you take a torsemide)., Disp: 60 tablet, Rfl: 3 .  torsemide (DEMADEX) 20 MG tablet, Take 2 tablets (40 mg total) by mouth as needed (as need for 3lbs weight gain overnight/ 5lb in a week/ SOB)., Disp: 60 tablet, Rfl: 6   Past Medical History  Diagnosis Date  . Hypertension   . Anemia   . Arrhythmia   . Hypercholesteremia   . Chronic headaches   . Asthma   . Anginal pain (HCC)     2 months  . CHF (congestive heart failure) (HCC)     1990s  . COPD (chronic obstructive pulmonary disease) (HCC)     ??    No Known Allergies  Review of Systems Constitutional:   No  weight loss, night sweats,  Fevers, chills, fatigue, or  lassitude.  HEENT:   No headaches,  Difficulty swallowing,  Tooth/dental problems, or  Sore  throat,                No sneezing, itching, ear ache, nasal congestion, post nasal drip,   CV:  No chest pain,  Orthopnea, PND, swelling in lower extremities, anasarca, dizziness, palpitations, syncope.   GI  No heartburn, indigestion, abdominal pain, nausea, vomiting, diarrhea, change in bowel habits, loss of appetite, bloody stools.   Resp: + shortness of breath with exertion not  at rest.  No excess mucus, no productive cough,  No non-productive cough,  No coughing up of blood.  No change in color of mucus.  + wheezing.  No chest wall deformity  Skin: no rash or lesions.  GU:  no dysuria, change in color of urine, no urgency or frequency.  No flank pain, no hematuria   MS:  No joint pain or swelling.  No decreased range of motion.  No back pain.  Psych:  No change in mood or affect. No depression or anxiety.  No memory loss.        Objective:   Physical Exam  BP 140/84 mmHg  Pulse 93  Ht 5' (1.524 m)  Wt 178 lb (80.74 kg)  BMI 34.76 kg/m2  SpO2 92%  Physical Exam:  General- No distress,  A&Ox3 ENT: No sinus tenderness, TM clear, pale nasal mucosa, no oral exudate,no post nasal drip, no LAN Cardiac: S1, S2, regular rate and rhythm, no murmur Chest: No wheeze/ rales/ dullness; no accessory muscle use, no nasal flaring, no sternal retractions Abd.: Soft Non-tender Ext: No clubbing cyanosis, edema Neuro:  normal strength Skin: No rashes, warm and dry Psych: normal mood and behavior Bevelyn Ngo, AGACNP-BC [facility] Pulmonary/Critical Care Medicine 02/12/2016     Assessment & Plan:

## 2016-02-12 NOTE — Patient Instructions (Addendum)
It is nice to meet you today. We will give you a Breo sample and new prescription for your Breo.( Maintenance inhaler) Use the Breo 1 puff twice daily. We will give you a prescription for your Pro Air Inhaler ( Rescue Inhaler) Use this every 6 hours as needed for SOB or Wheezing. Claritin ( loratadine) 1 every day for allergies Nasal Saline as needed for nasal congestion Continue weighing yourself daily per the heart failure clinic Follow up in 1 month. ( Dr. Gwenette Greet patient needs to be reassigned) Please contact office for sooner follow up if symptoms do not improve or worsen or seek emergency care

## 2016-02-13 NOTE — Progress Notes (Signed)
Chart and office note reviewed in detail  > agree with a/p as outlined    

## 2016-02-23 ENCOUNTER — Telehealth (HOSPITAL_COMMUNITY): Payer: Self-pay | Admitting: Vascular Surgery

## 2016-02-23 NOTE — Telephone Encounter (Signed)
Left pt message pt canceled 02/25/16 appt via automated service, called to as pt if she wanted to reschedule

## 2016-02-25 ENCOUNTER — Encounter (HOSPITAL_COMMUNITY): Payer: Medicaid Other

## 2016-03-14 ENCOUNTER — Ambulatory Visit: Payer: Medicaid Other | Admitting: Internal Medicine

## 2016-03-29 ENCOUNTER — Inpatient Hospital Stay (HOSPITAL_COMMUNITY): Admission: RE | Admit: 2016-03-29 | Payer: Medicaid Other | Source: Ambulatory Visit

## 2016-04-03 ENCOUNTER — Inpatient Hospital Stay (HOSPITAL_COMMUNITY)
Admission: EM | Admit: 2016-04-03 | Discharge: 2016-04-08 | DRG: 871 | Disposition: A | Discharge status: Home / Self Care | Payer: Medicare Other | Attending: Internal Medicine | Admitting: Internal Medicine

## 2016-04-03 ENCOUNTER — Encounter (HOSPITAL_COMMUNITY): Payer: Self-pay | Admitting: Emergency Medicine

## 2016-04-03 ENCOUNTER — Emergency Department (HOSPITAL_COMMUNITY): Payer: Medicare Other

## 2016-04-03 DIAGNOSIS — Z91128 Patient's intentional underdosing of medication regimen for other reason: Secondary | ICD-10-CM

## 2016-04-03 DIAGNOSIS — N179 Acute kidney failure, unspecified: Secondary | ICD-10-CM | POA: Diagnosis present

## 2016-04-03 DIAGNOSIS — N183 Chronic kidney disease, stage 3 (moderate): Secondary | ICD-10-CM | POA: Diagnosis present

## 2016-04-03 DIAGNOSIS — T50906A Underdosing of unspecified drugs, medicaments and biological substances, initial encounter: Secondary | ICD-10-CM | POA: Diagnosis present

## 2016-04-03 DIAGNOSIS — Z9114 Patient's other noncompliance with medication regimen: Secondary | ICD-10-CM

## 2016-04-03 DIAGNOSIS — J13 Pneumonia due to Streptococcus pneumoniae: Secondary | ICD-10-CM | POA: Diagnosis present

## 2016-04-03 DIAGNOSIS — J45909 Unspecified asthma, uncomplicated: Secondary | ICD-10-CM | POA: Diagnosis present

## 2016-04-03 DIAGNOSIS — J9602 Acute respiratory failure with hypercapnia: Secondary | ICD-10-CM | POA: Diagnosis not present

## 2016-04-03 DIAGNOSIS — E119 Type 2 diabetes mellitus without complications: Secondary | ICD-10-CM

## 2016-04-03 DIAGNOSIS — F17210 Nicotine dependence, cigarettes, uncomplicated: Secondary | ICD-10-CM | POA: Diagnosis not present

## 2016-04-03 DIAGNOSIS — I5042 Chronic combined systolic (congestive) and diastolic (congestive) heart failure: Secondary | ICD-10-CM

## 2016-04-03 DIAGNOSIS — I5023 Acute on chronic systolic (congestive) heart failure: Secondary | ICD-10-CM | POA: Diagnosis present

## 2016-04-03 DIAGNOSIS — Z7951 Long term (current) use of inhaled steroids: Secondary | ICD-10-CM

## 2016-04-03 DIAGNOSIS — Z8249 Family history of ischemic heart disease and other diseases of the circulatory system: Secondary | ICD-10-CM

## 2016-04-03 DIAGNOSIS — J9690 Respiratory failure, unspecified, unspecified whether with hypoxia or hypercapnia: Secondary | ICD-10-CM | POA: Diagnosis present

## 2016-04-03 DIAGNOSIS — A599 Trichomoniasis, unspecified: Secondary | ICD-10-CM | POA: Diagnosis present

## 2016-04-03 DIAGNOSIS — R0602 Shortness of breath: Secondary | ICD-10-CM | POA: Diagnosis not present

## 2016-04-03 DIAGNOSIS — I5043 Acute on chronic combined systolic (congestive) and diastolic (congestive) heart failure: Secondary | ICD-10-CM | POA: Diagnosis present

## 2016-04-03 DIAGNOSIS — J9601 Acute respiratory failure with hypoxia: Secondary | ICD-10-CM | POA: Diagnosis present

## 2016-04-03 DIAGNOSIS — R8290 Unspecified abnormal findings in urine: Secondary | ICD-10-CM | POA: Diagnosis present

## 2016-04-03 DIAGNOSIS — J189 Pneumonia, unspecified organism: Secondary | ICD-10-CM | POA: Diagnosis not present

## 2016-04-03 DIAGNOSIS — Z800 Family history of malignant neoplasm of digestive organs: Secondary | ICD-10-CM

## 2016-04-03 DIAGNOSIS — I10 Essential (primary) hypertension: Secondary | ICD-10-CM | POA: Diagnosis present

## 2016-04-03 DIAGNOSIS — E872 Acidosis: Secondary | ICD-10-CM

## 2016-04-03 DIAGNOSIS — Z23 Encounter for immunization: Secondary | ICD-10-CM

## 2016-04-03 DIAGNOSIS — Z825 Family history of asthma and other chronic lower respiratory diseases: Secondary | ICD-10-CM

## 2016-04-03 DIAGNOSIS — Y92039 Unspecified place in apartment as the place of occurrence of the external cause: Secondary | ICD-10-CM

## 2016-04-03 DIAGNOSIS — I130 Hypertensive heart and chronic kidney disease with heart failure and stage 1 through stage 4 chronic kidney disease, or unspecified chronic kidney disease: Secondary | ICD-10-CM | POA: Diagnosis present

## 2016-04-03 DIAGNOSIS — A403 Sepsis due to Streptococcus pneumoniae: Secondary | ICD-10-CM | POA: Diagnosis not present

## 2016-04-03 DIAGNOSIS — E8729 Other acidosis: Secondary | ICD-10-CM

## 2016-04-03 DIAGNOSIS — N390 Urinary tract infection, site not specified: Secondary | ICD-10-CM | POA: Diagnosis present

## 2016-04-03 DIAGNOSIS — R7989 Other specified abnormal findings of blood chemistry: Secondary | ICD-10-CM | POA: Diagnosis present

## 2016-04-03 DIAGNOSIS — I5022 Chronic systolic (congestive) heart failure: Secondary | ICD-10-CM | POA: Diagnosis present

## 2016-04-03 DIAGNOSIS — D72829 Elevated white blood cell count, unspecified: Secondary | ICD-10-CM | POA: Diagnosis present

## 2016-04-03 DIAGNOSIS — Z833 Family history of diabetes mellitus: Secondary | ICD-10-CM

## 2016-04-03 DIAGNOSIS — I340 Nonrheumatic mitral (valve) insufficiency: Secondary | ICD-10-CM | POA: Diagnosis present

## 2016-04-03 LAB — COMPREHENSIVE METABOLIC PANEL
ALT: 30 U/L (ref 14–54)
ANION GAP: 10 (ref 5–15)
AST: 34 U/L (ref 15–41)
Albumin: 3.7 g/dL (ref 3.5–5.0)
Alkaline Phosphatase: 115 U/L (ref 38–126)
BUN: 15 mg/dL (ref 6–20)
CHLORIDE: 106 mmol/L (ref 101–111)
CO2: 22 mmol/L (ref 22–32)
CREATININE: 1.41 mg/dL — AB (ref 0.44–1.00)
Calcium: 9 mg/dL (ref 8.9–10.3)
GFR, EST AFRICAN AMERICAN: 49 mL/min — AB (ref >60)
GFR, EST NON AFRICAN AMERICAN: 42 mL/min — AB (ref >60)
Glucose, Bld: 349 mg/dL — ABNORMAL HIGH (ref 65–99)
POTASSIUM: 4.1 mmol/L (ref 3.5–5.1)
Sodium: 138 mmol/L (ref 135–145)
Total Bilirubin: 0.7 mg/dL (ref 0.3–1.2)
Total Protein: 7 g/dL (ref 6.5–8.1)

## 2016-04-03 LAB — CBC WITH DIFFERENTIAL/PLATELET
BASOS ABS: 0 10*3/uL (ref 0.0–0.1)
BASOS ABS: 0 10*3/uL (ref 0.0–0.1)
BASOS PCT: 0 %
Basophils Relative: 0 %
Eosinophils Absolute: 0.4 10*3/uL (ref 0.0–0.7)
Eosinophils Absolute: 0 10*3/uL (ref 0.0–0.7)
Eosinophils Relative: 2 %
Eosinophils Relative: 0 %
HEMATOCRIT: 45.7 % (ref 36.0–46.0)
HEMATOCRIT: 40.8 % (ref 36.0–46.0)
HEMOGLOBIN: 12.5 g/dL (ref 12.0–15.0)
Hemoglobin: 13.7 g/dL (ref 12.0–15.0)
LYMPHS ABS: 6.5 10*3/uL — AB (ref 0.7–4.0)
Lymphocytes Relative: 33 %
Lymphocytes Relative: 3 %
Lymphs Abs: 0.4 10*3/uL — ABNORMAL LOW (ref 0.7–4.0)
MCH: 28.7 pg (ref 26.0–34.0)
MCH: 28.1 pg (ref 26.0–34.0)
MCHC: 30 g/dL (ref 30.0–36.0)
MCHC: 30.6 g/dL (ref 30.0–36.0)
MCV: 95.8 fL (ref 78.0–100.0)
MCV: 91.7 fL (ref 78.0–100.0)
MONOS PCT: 4 %
MONOS PCT: 1 %
Monocytes Absolute: 0.8 10*3/uL (ref 0.1–1.0)
Monocytes Absolute: 0.1 10*3/uL (ref 0.1–1.0)
NEUTROS ABS: 12.9 10*3/uL — AB (ref 1.7–7.7)
NEUTROS PCT: 61 %
NEUTROS PCT: 96 %
Neutro Abs: 11.9 10*3/uL — ABNORMAL HIGH (ref 1.7–7.7)
PLATELETS: 390 10*3/uL (ref 150–400)
Platelets: 336 10*3/uL (ref 150–400)
RBC: 4.77 MIL/uL (ref 3.87–5.11)
RBC: 4.45 MIL/uL (ref 3.87–5.11)
RDW: 13.7 % (ref 11.5–15.5)
RDW: 13.5 % (ref 11.5–15.5)
WBC: 19.6 10*3/uL — AB (ref 4.0–10.5)
WBC: 13.5 10*3/uL — ABNORMAL HIGH (ref 4.0–10.5)

## 2016-04-03 LAB — URINALYSIS, ROUTINE W REFLEX MICROSCOPIC
BILIRUBIN URINE: NEGATIVE
GLUCOSE, UA: 500 mg/dL — AB
Ketones, ur: NEGATIVE mg/dL
Nitrite: NEGATIVE
PH: 6.5 (ref 5.0–8.0)
Protein, ur: 100 mg/dL — AB
SPECIFIC GRAVITY, URINE: 1.013 (ref 1.005–1.030)

## 2016-04-03 LAB — I-STAT ARTERIAL BLOOD GAS, ED
ACID-BASE DEFICIT: 8 mmol/L — AB (ref 0.0–2.0)
Acid-base deficit: 3 mmol/L — ABNORMAL HIGH (ref 0.0–2.0)
BICARBONATE: 22.8 meq/L (ref 20.0–24.0)
BICARBONATE: 21.7 meq/L (ref 20.0–24.0)
O2 SAT: 100 %
O2 Saturation: 96 %
PH ART: 7.362 (ref 7.350–7.450)
PO2 ART: 324 mmHg — AB (ref 80.0–100.0)
PO2 ART: 86 mmHg (ref 80.0–100.0)
Patient temperature: 98.6
TCO2: 25 mmol/L (ref 0–100)
TCO2: 23 mmol/L (ref 0–100)
pCO2 arterial: 68.6 mmHg (ref 35.0–45.0)
pCO2 arterial: 38.3 mmHg (ref 35.0–45.0)
pH, Arterial: 7.13 — CL (ref 7.350–7.450)

## 2016-04-03 LAB — GLUCOSE, CAPILLARY
GLUCOSE-CAPILLARY: 114 mg/dL — AB (ref 65–99)
GLUCOSE-CAPILLARY: 134 mg/dL — AB (ref 65–99)
Glucose-Capillary: 123 mg/dL — ABNORMAL HIGH (ref 65–99)
Glucose-Capillary: 87 mg/dL (ref 65–99)

## 2016-04-03 LAB — CBG MONITORING, ED: GLUCOSE-CAPILLARY: 233 mg/dL — AB (ref 65–99)

## 2016-04-03 LAB — URINE MICROSCOPIC-ADD ON

## 2016-04-03 LAB — MRSA PCR SCREENING: MRSA by PCR: NEGATIVE

## 2016-04-03 LAB — PROCALCITONIN: Procalcitonin: 0.53 ng/mL

## 2016-04-03 LAB — BRAIN NATRIURETIC PEPTIDE: B NATRIURETIC PEPTIDE 5: 775 pg/mL — AB (ref 0.0–100.0)

## 2016-04-03 LAB — LACTIC ACID, PLASMA
LACTIC ACID, VENOUS: 1.5 mmol/L (ref 0.5–2.0)
LACTIC ACID, VENOUS: 1.7 mmol/L (ref 0.5–2.0)
Lactic Acid, Venous: 2.1 mmol/L (ref 0.5–2.0)
Lactic Acid, Venous: 1.4 mmol/L (ref 0.5–2.0)

## 2016-04-03 LAB — I-STAT TROPONIN, ED: Troponin i, poc: 0.03 ng/mL (ref 0.00–0.08)

## 2016-04-03 LAB — I-STAT CG4 LACTIC ACID, ED: LACTIC ACID, VENOUS: 3.32 mmol/L — AB (ref 0.5–2.0)

## 2016-04-03 MED ORDER — INSULIN ASPART 100 UNIT/ML ~~LOC~~ SOLN: 0.0000 [IU] | SUBCUTANEOUS | Status: DC

## 2016-04-03 MED ORDER — HYDRALAZINE HCL 20 MG/ML IJ SOLN
10.0000 mg | Freq: Four times a day (QID) | INTRAMUSCULAR | Status: DC | PRN
  Administered 2016-04-04: 10 mg via INTRAVENOUS
  Filled 2016-04-03: qty 1

## 2016-04-03 MED ORDER — DEXTROSE 5 % IV SOLN
1.0000 g | Freq: Once | INTRAVENOUS | Status: AC
  Administered 2016-04-03: 1 g via INTRAVENOUS
  Filled 2016-04-03: qty 10

## 2016-04-03 MED ORDER — FLUTICASONE FUROATE-VILANTEROL 100-25 MCG/INH IN AEPB
1.0000 | INHALATION_SPRAY | Freq: Two times a day (BID) | RESPIRATORY_TRACT | Status: DC
  Administered 2016-04-04 – 2016-04-08 (×9): 1 via RESPIRATORY_TRACT
  Filled 2016-04-03: qty 28

## 2016-04-03 MED ORDER — OXYCODONE-ACETAMINOPHEN 5-325 MG PO TABS
1.0000 | ORAL_TABLET | ORAL | Status: DC | PRN
  Administered 2016-04-03 – 2016-04-04 (×3): 2 via ORAL
  Administered 2016-04-04: 1 via ORAL
  Administered 2016-04-04 – 2016-04-06 (×5): 2 via ORAL
  Filled 2016-04-03: qty 1
  Filled 2016-04-03 (×9): qty 2

## 2016-04-03 MED ORDER — ACETAMINOPHEN 325 MG PO TABS
650.0000 mg | ORAL_TABLET | Freq: Once | ORAL | Status: AC
  Administered 2016-04-03: 650 mg via ORAL
  Filled 2016-04-03: qty 2

## 2016-04-03 MED ORDER — METRONIDAZOLE 500 MG PO TABS
2000.0000 mg | ORAL_TABLET | Freq: Once | ORAL | Status: AC
  Administered 2016-04-04: 2000 mg via ORAL
  Filled 2016-04-03: qty 4

## 2016-04-03 MED ORDER — MORPHINE SULFATE (PF) 2 MG/ML IV SOLN
2.0000 mg | Freq: Once | INTRAVENOUS | Status: AC
  Administered 2016-04-03: 2 mg via INTRAVENOUS
  Filled 2016-04-03: qty 1

## 2016-04-03 MED ORDER — CARVEDILOL 3.125 MG PO TABS
3.1250 mg | ORAL_TABLET | Freq: Two times a day (BID) | ORAL | Status: DC
  Administered 2016-04-03 – 2016-04-04 (×2): 3.125 mg via ORAL
  Filled 2016-04-03 (×2): qty 1

## 2016-04-03 MED ORDER — ALBUTEROL SULFATE (2.5 MG/3ML) 0.083% IN NEBU: 2.5000 mg | INHALATION_SOLUTION | Freq: Four times a day (QID) | RESPIRATORY_TRACT | Status: DC | PRN

## 2016-04-03 MED ORDER — NITROGLYCERIN IN D5W 200-5 MCG/ML-% IV SOLN
5.0000 ug/min | Freq: Once | INTRAVENOUS | Status: AC
  Administered 2016-04-03: 50 ug/min via INTRAVENOUS
  Filled 2016-04-03: qty 250

## 2016-04-03 MED ORDER — DEXTROSE 5 % IV SOLN
1.0000 g | INTRAVENOUS | Status: DC
  Administered 2016-04-04 – 2016-04-08 (×5): 1 g via INTRAVENOUS
  Filled 2016-04-03 (×5): qty 10

## 2016-04-03 MED ORDER — NITROGLYCERIN 2 % TD OINT
1.0000 [in_us] | TOPICAL_OINTMENT | Freq: Once | TRANSDERMAL | Status: DC
Start: 2016-04-03 — End: 2016-04-03

## 2016-04-03 MED ORDER — INSULIN ASPART 100 UNIT/ML ~~LOC~~ SOLN
0.0000 [IU] | SUBCUTANEOUS | Status: DC
  Administered 2016-04-03: 3 [IU] via SUBCUTANEOUS
  Administered 2016-04-03: 1 [IU] via SUBCUTANEOUS
  Administered 2016-04-04: 2 [IU] via SUBCUTANEOUS
  Administered 2016-04-05: 3 [IU] via SUBCUTANEOUS
  Administered 2016-04-06: 2 [IU] via SUBCUTANEOUS
  Administered 2016-04-06 – 2016-04-07 (×6): 1 [IU] via SUBCUTANEOUS
  Administered 2016-04-07: 2 [IU] via SUBCUTANEOUS
  Filled 2016-04-03 (×2): qty 1

## 2016-04-03 MED ORDER — DEXTROSE 5 % IV SOLN
500.0000 mg | Freq: Once | INTRAVENOUS | Status: AC
  Administered 2016-04-03: 500 mg via INTRAVENOUS
  Filled 2016-04-03: qty 500

## 2016-04-03 MED ORDER — SODIUM CHLORIDE 0.9% FLUSH
3.0000 mL | INTRAVENOUS | Status: DC | PRN
  Administered 2016-04-04: 3 mL via INTRAVENOUS
  Filled 2016-04-03: qty 3

## 2016-04-03 MED ORDER — ALBUTEROL SULFATE HFA 108 (90 BASE) MCG/ACT IN AERS: 2.0000 | INHALATION_SPRAY | Freq: Four times a day (QID) | RESPIRATORY_TRACT | Status: DC | PRN

## 2016-04-03 MED ORDER — FUROSEMIDE 10 MG/ML IJ SOLN
40.0000 mg | Freq: Once | INTRAMUSCULAR | Status: AC
  Administered 2016-04-03: 40 mg via INTRAVENOUS
  Filled 2016-04-03: qty 4

## 2016-04-03 MED ORDER — AZITHROMYCIN 500 MG PO TABS
500.0000 mg | ORAL_TABLET | ORAL | Status: DC
  Administered 2016-04-04 – 2016-04-07 (×4): 500 mg via ORAL
  Filled 2016-04-03 (×4): qty 1

## 2016-04-03 MED ORDER — ONDANSETRON HCL 4 MG/2ML IJ SOLN
4.0000 mg | Freq: Four times a day (QID) | INTRAMUSCULAR | Status: DC | PRN
  Administered 2016-04-05: 4 mg via INTRAVENOUS
  Filled 2016-04-03: qty 2

## 2016-04-03 MED ORDER — SODIUM CHLORIDE 0.9% FLUSH
3.0000 mL | Freq: Two times a day (BID) | INTRAVENOUS | Status: DC
  Administered 2016-04-04 – 2016-04-07 (×8): 3 mL via INTRAVENOUS

## 2016-04-03 MED ORDER — ENOXAPARIN SODIUM 40 MG/0.4ML ~~LOC~~ SOLN
40.0000 mg | SUBCUTANEOUS | Status: DC
  Administered 2016-04-03 – 2016-04-07 (×5): 40 mg via SUBCUTANEOUS
  Filled 2016-04-03 (×5): qty 0.4

## 2016-04-03 MED ORDER — MAGNESIUM SULFATE 2 GM/50ML IV SOLN
2.0000 g | Freq: Once | INTRAVENOUS | Status: AC
  Administered 2016-04-03: 2 g via INTRAVENOUS

## 2016-04-03 MED ORDER — SODIUM CHLORIDE 0.9 % IV SOLN: 250.0000 mL | INTRAVENOUS | Status: DC | PRN

## 2016-04-03 MED ORDER — FUROSEMIDE 10 MG/ML IJ SOLN: 80.0000 mg | INTRAMUSCULAR | Status: DC

## 2016-04-03 MED ORDER — MAGNESIUM SULFATE 2 GM/50ML IV SOLN
2.0000 g | Freq: Once | INTRAVENOUS | Status: DC
  Filled 2016-04-03: qty 50

## 2016-04-03 MED ORDER — FUROSEMIDE 10 MG/ML IJ SOLN
60.0000 mg | INTRAMUSCULAR | Status: AC
  Administered 2016-04-03: 60 mg via INTRAVENOUS
  Filled 2016-04-03: qty 6

## 2016-04-03 MED ORDER — ACETAMINOPHEN 325 MG PO TABS
650.0000 mg | ORAL_TABLET | ORAL | Status: DC | PRN
  Filled 2016-04-03: qty 2

## 2016-04-03 NOTE — Progress Notes (Signed)
Switched PT from [facility] to [facility].

## 2016-04-03 NOTE — Progress Notes (Signed)
CRITICAL VALUE ALERT  Critical value received:  Lactic Acid 2.1  Date of notification:  04/03/2016  Time of notification:  2:18  Critical value read back: yes  Nurse who received alert:  Deveron Furlong  MD notified (1st page):  Princella Ion  Time of first page:  2:19  MD notified (2nd page):  Time of second page:  Responding MD:  Princella Ion  Time MD responded:  2:20

## 2016-04-03 NOTE — ED Provider Notes (Addendum)
Pt seen by Dr Rex Kras on her initial presentation.  Presented to the ED with worsening shortness of breath, wheezing, cough.   EMS called to the home and was noted to have wheezing and crackles.  She was HTN and dyspneic.   STarted on CPAP, steroids and breathing treatments.  In the ED continued on bipap.   Started on NTG drip and, magnesium and lasix.  CXR shows possible pna versus assymetric pulm edema.  Started on abx to cover CAp.  At 0740 pt is feeling a little better.  She is complaining of a headache. Physical Exam  BP 157/110 mmHg  Pulse 114  Temp(Src) 97.5 F (36.4 C) (Axillary)  Resp 34  SpO2 89%  LMP  (LMP Unknown)  Physical Exam  Constitutional: She is oriented to person, place, and time. She appears well-developed and well-nourished.  HENT:  Head: Normocephalic and atraumatic.  Right Ear: External ear normal.  Left Ear: External ear normal.  Eyes: Conjunctivae are normal. Right eye exhibits no discharge. Left eye exhibits no discharge. No scleral icterus.  Neck: Neck supple. No tracheal deviation present.  Cardiovascular: Normal rate.   Pulmonary/Chest: Effort normal. No stridor. No respiratory distress. She has wheezes. She has rales.  On bipap   Musculoskeletal: She exhibits no edema or tenderness.  Neurological: She is alert and oriented to person, place, and time. Cranial nerve deficit: no gross deficits.  Skin: Skin is warm and dry. No rash noted. She is not diaphoretic.  Psychiatric: She has a normal mood and affect.  Nursing note and vitals reviewed.   ED Course  Procedures  MDM Labs show an elevated wbc and lactic acid level.  ABG consistent with a respiratory acidosis.   Treated for PNA and CHF exacerbation.  CMET and BNP are pending.  Pt will require admission.  Will continue to monitor but appears to be improving with decreased work of breathing and BP is decreasing.  0830  Pt was moved into a different room in the ED.  Bipap machine was not working  properly.  I have asked them to restart the BIPAP considering her tachypnea although Spo2 is stable.  Will recheck ABG and consult with medical service for admission to stepdown unit.      Dorie Rank, MD 04/03/16 [phone]  Repeat BNP significantly better.  7.362.  Pt has responded well to treatment so far.  Dorie Rank, MD 04/03/16 [phone]

## 2016-04-03 NOTE — H&P (Signed)
History and Physical    Dariel Teixeira NFA:[id number] DOB: [birth year] DOA: 04/03/2016   PCP: Billee Cashing, MD   Patient coming from/Resides with: Private residence/lives alone  Chief Complaint: Shortness of breath  HPI: Stacey Lin is a 50 y.o. female with medical history significant for history of chronic combined systolic and diastolic congestive heart failure (followed by the heart failure clinic as an outpatient), hypertension, mitral valve regurgitation, asthmatic bronchitis, history of prior cocaine abuse, uterine fibroids with menorrhagia, ongoing tobacco use and documented history of noncompliance with medicines. Patient reports that for greater than several months she had switched her diuretic to as needed because of feeling weak and washed out. In review of the EMR her weights have been decreasing steadily since October 2016 where her weight peaked at 195 pounds with her last documented weight in April 2017 178 pounds. The patient reports that for the past 2-3 days she has been quite short of breath with hot flashes and a dry nonproductive cough. She denied lower extremity edema. She did not take any extra Demadex. She took her other medications as prescribed. She is not adhered to a salt restricted diet. She continues to smoke but no more than one cigarette per day.  ED Course:  Ax 97.5-BP 157/110-pulse 1:15 and regular-respirations 34-room air saturations 85% Repeat vital signs: BP 130/92-pulse 97-respirations 15-saturations 97% on BiPAP with 50% FiO2 Portable chest x-ray: with right sided airspace opacification most prominent in lung base that may reflect asymmetric pulmonary edema versus pneumonia Lab data: Sodium 138, potassium 4.1, BUN 15, creatinine 1.41, glucose 349, LFTs normal, 23 troponin 0.03, lactic acid 3.32, WBCs 19,600 with neutrophils 61% and absolute neutrophils 11.9%, hemoglobin 13.7, platelets 390,000, urinalysis abnormal with 500 glucose, few bacteria, large  leukocytes, 6-30 squamous epithelials, Trichomonas present, 6-30 WBCs, blood cultures and urine culture obtained in the ER. ABG: PH 7.13-PCO2 68.6-PO2 324-acid base deficit 8 Repeat ABG: PH 7.36-PCO2 38.3-PO2 86-acid base deficit 3 Nitroglycerin drip titrated Magnesium 2 g IV 1 Rocephin 1 g IV 1 Zithromax 500 mg IV 1 Lasix 60 mg IV 1 Tylenol 650 mg by mouth 1   Review of Systems:  In addition to the HPI above,  No Fever-chills, myalgias or other constitutional symptoms No Headache, changes with Vision or hearing, new weakness, tingling, numbness in any extremity, No problems swallowing food or Liquids, indigestion/reflux No Chest pain, palpitations, orthopnea  No Abdominal pain, N/V; no melena or hematochezia, no dark tarry stools, Bowel movements are regular, No dysuria, hematuria or flank pain No new skin rashes, lesions, masses or bruises, No new joints pains-aches No recent weight gain or loss No polyuria, polydypsia or polyphagia,   Past Medical History  Diagnosis Date  . Hypertension   . Anemia   . Arrhythmia   . Hypercholesteremia   . Chronic headaches   . Asthma   . Anginal pain (HCC)     2 months  . CHF (congestive heart failure) (HCC)     1990s  . COPD (chronic obstructive pulmonary disease) (HCC)     ??    Past Surgical History  Procedure Laterality Date  . Tubal ligation  1986  . Left and right heart catheterization with coronary angiogram N/A 06/06/2014    Procedure: LEFT AND RIGHT HEART CATHETERIZATION WITH CORONARY ANGIOGRAM;  Surgeon: Dolores Patty, MD;  Location: [facility] CATH LAB;  Service: Cardiovascular;  Laterality: N/A;  . Multiple extractions with alveoloplasty Bilateral 07/24/2015    Procedure: MULTIPLE EXTRACTIONS WITH ALVEOLOPLASTY,  BILATERAL TORI ;  Surgeon: Ocie Doyne, DDS;  Location: [facility] OR;  Service: Oral Surgery;  Laterality: Bilateral;    Social History   Social History  . Marital Status: Legally Separated    Spouse Name: N/A    . Number of Children: N/A  . Years of Education: N/A   Occupational History  . Not on file.   Social History Main Topics  . Smoking status: Current Some Day Smoker -- 0.10 packs/day for 20 years    Types: Cigarettes  . Smokeless tobacco: Never Used     Comment: Smokes occasionally when someone has a cig to have, doesn't buy them.  . Alcohol Use: No  . Drug Use: No     Comment: former, stopped smoking crack X 5 years ago  . Sexual Activity: Not on file   Other Topics Concern  . Not on file   Social History Narrative   Lives with son in an apartment on the third floor.  Does not work.  On disability.  Previously worked in [employer].    Education: high school.      Mobility: Without assistive devices but patient states she has some difficulty walking at times and has to hold onto the side of the wall Work history: Not obtained   No Known Allergies  Family History  Problem Relation Age of Onset  . Hypertension Mother   . Hypertension Father     Deceased  . Hypertension Maternal Grandmother   . Allergies Mother   . Asthma Mother   . Heart disease Mother   . Stomach cancer Mother     Deceased, 15  . Seizures Mother   . Healthy Brother   . Healthy Son   . Healthy Daughter    Family history reviewed;Patient reports history of diabetes within her family  Prior to Admission medications   Medication Sig Start Date End Date Taking? Authorizing Provider  acetaminophen (TYLENOL) 500 MG tablet Take 500 mg by mouth every 6 (six) hours as needed.    Historical Provider, MD  albuterol (PROAIR HFA) 108 (90 Base) MCG/ACT inhaler Inhale 2 puffs into the lungs every 6 (six) hours as needed for wheezing or shortness of breath. 02/12/16   Bevelyn Ngo, NP  carvedilol (COREG) 3.125 MG tablet Take 1 tablet (3.125 mg total) by mouth 2 (two) times daily with a meal. 01/12/16   Graciella Freer, PA-C  fluticasone furoate-vilanterol (BREO ELLIPTA) 100-25 MCG/INH AEPB Inhale 1 puff into  the lungs 2 (two) times daily. Reported on 01/12/2016 02/12/16   Bevelyn Ngo, NP  losartan (COZAAR) 25 MG tablet Take 1 tablet (25 mg total) by mouth daily. 02/11/16   Graciella Freer, PA-C  oxyCODONE-acetaminophen (PERCOCET) 5-325 MG tablet Take 1-2 tablets by mouth every 4 (four) hours as needed for severe pain. 07/24/15   Ocie Doyne, DDS  potassium chloride (K-DUR) 10 MEQ tablet Take 2 tablets (20 mEq total) by mouth as needed (When you take a torsemide). 01/12/16   Graciella Freer, PA-C  torsemide (DEMADEX) 20 MG tablet Take 2 tablets (40 mg total) by mouth as needed (as need for 3lbs weight gain overnight/ 5lb in a week/ SOB). 01/12/16   Graciella Freer, PA-C    Physical Exam: Filed Vitals:   04/03/16 0830 04/03/16 0853 04/03/16 0900 04/03/16 0944  BP: 138/87  130/92   Pulse: 109 101 97   Temp:      TempSrc:      Resp: 32 26 15   Weight:  173 lb 4.5 oz (78.6 kg)  SpO2: 96% 95% 97%       Constitutional: NAD, calm, comfortableAnd seems somewhat drowsy despite correction and PCO2 Eyes: PERRL, lids and conjunctivae normal ENMT: Mucous membranes and Posterior pharynx not examined with BiPAP mask in place. Neck: normal, supple, no masses, no thyromegaly Respiratory: clear to auscultation bilaterally anteriorly, no audible wheezing. Normal respiratory effort. No accessory muscle use. BiPAP in place with 50% FiO2 Cardiovascular: Regular rate and rhythm, 1/6 systolic murmur, no rubs / gallops. No extremity edema. 2+ pedal pulses. No carotid bruits.  Abdomen: no tenderness, no masses palpated. No hepatosplenomegaly. Bowel sounds positive.  Musculoskeletal: no clubbing / cyanosis. No joint deformity upper and lower extremities. Good ROM, no contractures. Normal muscle tone.  Skin: no rashes, lesions, ulcers. No induration Neurologic: CN 2-12 grossly intact. Sensation intact, DTR normal. Strength 5/5 x all 4 extremities.  Psychiatric: Normal judgment and insight. Alert  and oriented x 3. Normal mood. Appears to be drowsy but awakens easily   Labs on Admission: I have personally reviewed following labs and imaging studies  CBC:  Recent Labs Lab 04/03/16 0636  WBC 19.6*  NEUTROABS 11.9*  HGB 13.7  HCT 45.7  MCV 95.8  PLT 390   Basic Metabolic Panel:  Recent Labs Lab 04/03/16 0636  NA 138  K 4.1  CL 106  CO2 22  GLUCOSE 349*  BUN 15  CREATININE 1.41*  CALCIUM 9.0   GFR: Estimated Creatinine Clearance: 43.7 mL/min (by C-G formula based on Cr of 1.41). Liver Function Tests:  Recent Labs Lab 04/03/16 0636  AST 34  ALT 30  ALKPHOS 115  BILITOT 0.7  PROT 7.0  ALBUMIN 3.7   No results for input(s): LIPASE, AMYLASE in the last 168 hours. No results for input(s): AMMONIA in the last 168 hours. Coagulation Profile: No results for input(s): INR, PROTIME in the last 168 hours. Cardiac Enzymes: No results for input(s): CKTOTAL, CKMB, CKMBINDEX, TROPONINI in the last 168 hours. BNP (last 3 results) No results for input(s): PROBNP in the last 8760 hours. HbA1C: No results for input(s): HGBA1C in the last 72 hours. CBG:  Recent Labs Lab 04/03/16 0939  GLUCAP 233*   Lipid Profile: No results for input(s): CHOL, HDL, LDLCALC, TRIG, CHOLHDL, LDLDIRECT in the last 72 hours. Thyroid Function Tests: No results for input(s): TSH, T4TOTAL, FREET4, T3FREE, THYROIDAB in the last 72 hours. Anemia Panel: No results for input(s): VITAMINB12, FOLATE, FERRITIN, TIBC, IRON, RETICCTPCT in the last 72 hours. Urine analysis:    Component Value Date/Time   COLORURINE YELLOW 04/03/2016 0725   APPEARANCEUR CLOUDY* 04/03/2016 0725   LABSPEC 1.013 04/03/2016 0725   PHURINE 6.5 04/03/2016 0725   GLUCOSEU 500* 04/03/2016 0725   HGBUR SMALL* 04/03/2016 0725   BILIRUBINUR NEGATIVE 04/03/2016 0725   KETONESUR NEGATIVE 04/03/2016 0725   PROTEINUR 100* 04/03/2016 0725   UROBILINOGEN 0.2 03/14/2014 0045   NITRITE NEGATIVE 04/03/2016 0725    LEUKOCYTESUR LARGE* 04/03/2016 0725   Sepsis Labs: @LABRCNTIP (procalcitonin:4,lacticidven:4) )No results found for this or any previous visit (from the past 240 hour(s)).   Radiological Exams on Admission: Dg Chest Portable 1 View  04/03/2016  CLINICAL DATA:  Acute onset of shortness of breath. Initial encounter. EXAM: PORTABLE CHEST 1 VIEW COMPARISON:  Chest radiograph performed 03/13/2014, and CTA of the chest performed 07/01/2014 FINDINGS: The lungs are well-aerated. Right-sided airspace opacification, most prominent at the right lung base, may reflect asymmetric pulmonary edema or pneumonia. There is no evidence of  pleural effusion or pneumothorax. The cardiomediastinal silhouette is mildly enlarged. No acute osseous abnormalities are seen. IMPRESSION: Right-sided airspace opacification, most prominent at the right lung base, may reflect asymmetric pulmonary edema or pneumonia. Mild cardiomegaly noted. Electronically Signed   By: Roanna Raider M.D.   On: 04/03/2016 07:01    EKG: (Independently reviewed) sinus tachycardia with a ventricular rate of 130 bpm, QTC 444 ms, voltage criteria met for LVH, patient subtle ST segment elevation in leads V3 and V4 and previous inverted T waves in leads V3 through V5 have resolved  Assessment/Plan Principal Problem:   Acute respiratory failure with hypoxia and hypercarbia:   A) History of Chronic combined systolic and diastolic congestive heart failure    B) CAP (community acquired pneumonia) -Patient presents with progressive respiratory failure associated with dry cough but no other constitutional symptoms other than leukocytosis with left shift; has asymmetric changes on x-ray that could either be infectious or volume overload -Differential includes CHF exacerbation versus community-acquired pneumonia -BNP has just returned elevated at 775 so we will give at least 1 more dose of IV Lasix and then defer additional Lasix to rounding team in a.m. -Repeat  echocardiogram since was last completed November 2016: EF was 55% with no LVH and no mention of diastolic dysfunction-in 2014 patient had an EF of 30-35% with global hypokinesis and moderate mitral regurgitation with moderate to severe TR and pulmonary hypertension and it appears she has responded appropriately to medical therapies -Has diuresed nearly 1 L since receiving IV Lasix in ER -Cozaar on hold secondary to acute kidney injury. We will continue carvedilol -On IV nitroglycerin in the ER for afterload reduction-consider transitioning to oral nitrite/Apresoline versus discontinue altogether -Continue empiric Rocephin and Zithromax -Follow up on blood and sputum cultures, urinary strep -Chek Procalcitonin to see if can help clarify -Daily weights and strict I/O; patient's weight has trended downward significantly since the fall of 2016 where she weighed 195 pounds-obtain weight in ER -No documented history of COPD but does have a history of asthmatic bronchitis so continue home MDI -Hypercarbia and respiratory acidosis and has resolved after treatment with BiPAP so we'll continue prn  Active Problems:   AKI (acute kidney injury)  -Baseline renal function: 10/0.96 -Current renal function: 15/1.41 which is consistent with acute kidney injury -Hold ARB -Acute changes could be from hypoperfusion in setting of heart failure but will need to follow electrolytes closely    New onset type 2 diabetes mellitus  -No prior documented history of diabetes -Glucose of presentation elevated at 349 -Check hemoglobin A1c -Follow CBGs and begin SSI -Diabetes educator consultation    Elevated lactic acid level -Suspect related to hypoperfusion from acute heart failure with hypoxemia -Repeat lactic acid pending -Does not appear to be septic at this juncture but we'll continue to follow closely-on empiric antibiotics to treat possible pneumonia as well as possible UTI (see below)     Leukocytosis -Differential includes secondary to pneumonia and UTI versus reflective of low perfusion state secondary to heart failure -Treat underlying causes -Repeat CBC with differential in a.m.    Abnormal urinalysis -Possible UTI although does have significant number of squamous epithelials -Follow up on urine culture    Trichomonas infection -Noted in urine -We'll give one-time dose of Flagyl 2 g tomorrow-dose delayed until tomorrow, desire patient to remain off BiPAP greater than 4 hours so can give food prior to administration of this medication -Patient needs to be in made aware of this diagnosis so she  can notify her sexual contacts so they can seek treatment as well    Essential hypertension, benign -Primarily elevated diastolic blood pressure and improved since arrival to ER -Currently on IV nitroglycerin to decrease afterload in setting of presumed diastolic heart failure -Cozaar on hold secondary to acute kidney injury -Continue carvedilol    Mitral valve regurgitation -Follow-up echo this admission      DVT prophylaxis: Lovenox Code Status: Full code Family Communication: No family at bedside Disposition Plan: Anticipate discharge back to preadmission home environment once medically stable Consults called: None  Admission status: Stepdown/observation    Chidubem Chaires L. ANP-BC [facility] Pager [phone]   If 7PM-7AM, please contact night-coverage [email or web address] Password [facility]  04/03/2016, 9:48 AM

## 2016-04-03 NOTE — Progress Notes (Signed)
Per pts Aunt: "2 Leaky valves, diabetic real bad, asthma real bad, aneurysms run in family, cancers run in family (colon, pancreas) mother passed 4 years ago from complications of stroke and diabetes, Pt was on drugs real bad all kinds, crack, for about 20 years. Clean about 3 years. Pts uncle and grandfather passed from aneurysms." She wanted [place] to be aware.

## 2016-04-03 NOTE — ED Notes (Signed)
Admitting MD at the bedside. Attempted to call report , RN will call back

## 2016-04-03 NOTE — ED Notes (Addendum)
Pt. arrived with EMS from home reports worsening SOB onset last night with wheezing and rales and crackles on both lung fields,  received Solumedrol 125 mg IV and 2 nebulizer treatment by EMS prior to arrival . EMS started CPAP for pt.

## 2016-04-03 NOTE — ED Provider Notes (Signed)
CSN: [id number]     Arrival date & time 04/03/16  [id number] History   First MD Initiated Contact with Patient 04/03/16 [phone]     Chief Complaint  Patient presents with  . Shortness of Breath     (Consider location/radiation/quality/duration/timing/severity/associated sxs/prior Treatment) HPI Comments: 50 year old female with past medical history including CHF, COPD, hypertension who presents with shortness of breath. History obtained by EMS. They report that the patient called EMS for worsening shortness of breath since last night. On their arrival, she was in respiratory distress with wheezing and crackles noted on exam. Initial O2 saturation 60% which improved to 80 when placed on BiPAP. She received albuterol and Solu-Medrol in transport. The patient denies any chest pain.  LEVEL 5 CAVEAT DUE TO RESPIRATORY DISTRESS  Patient is a 50 y.o. female presenting with shortness of breath. The history is provided by the EMS personnel. The history is limited by the condition of the patient.  Shortness of Breath   Past Medical History  Diagnosis Date  . Hypertension   . Anemia   . Arrhythmia   . Hypercholesteremia   . Chronic headaches   . Asthma   . Anginal pain ([place])     2 months  . CHF (congestive heart failure) ([place])     1990s  . COPD (chronic obstructive pulmonary disease) ([place])     ??   Past Surgical History  Procedure Laterality Date  . Tubal ligation  1986  . Left and right heart catheterization with coronary angiogram N/A 06/06/2014    Procedure: LEFT AND RIGHT HEART CATHETERIZATION WITH CORONARY ANGIOGRAM;  Surgeon: Jolaine Artist, MD;  Location: [facility] CATH LAB;  Service: Cardiovascular;  Laterality: N/A;  . Multiple extractions with alveoloplasty Bilateral 07/24/2015    Procedure: MULTIPLE EXTRACTIONS WITH ALVEOLOPLASTY,  BILATERAL TORI ;  Surgeon: Diona Browner, DDS;  Location: [facility];  Service: Oral Surgery;  Laterality: Bilateral;   Family History  Problem Relation Age of Onset   . Hypertension Mother   . Hypertension Father     Deceased  . Hypertension Maternal Grandmother   . Allergies Mother   . Asthma Mother   . Heart disease Mother   . Stomach cancer Mother     Deceased, 17  . Seizures Mother   . Healthy Brother   . Healthy Son   . Healthy Daughter    Social History  Substance Use Topics  . Smoking status: Current Some Day Smoker -- 0.10 packs/day for 20 years    Types: Cigarettes  . Smokeless tobacco: Never Used     Comment: Smokes occasionally when someone has a cig to have, doesn't buy them.  . Alcohol Use: No   OB History    No data available     Review of Systems  Unable to perform ROS: Severe respiratory distress  Respiratory: Positive for shortness of breath.       Allergies  Review of patient's allergies indicates no known allergies.  Home Medications   Prior to Admission medications   Medication Sig Start Date End Date Taking? Authorizing Provider  acetaminophen (TYLENOL) 500 MG tablet Take 500 mg by mouth every 6 (six) hours as needed.    Historical Provider, MD  albuterol (PROAIR HFA) 108 (90 Base) MCG/ACT inhaler Inhale 2 puffs into the lungs every 6 (six) hours as needed for wheezing or shortness of breath. 02/12/16   Magdalen Spatz, NP  carvedilol (COREG) 3.125 MG tablet Take 1 tablet (3.125 mg total) by mouth 2 (  two) times daily with a meal. 01/12/16   Shirley Friar, PA-C  fluticasone furoate-vilanterol (BREO ELLIPTA) 100-25 MCG/INH AEPB Inhale 1 puff into the lungs 2 (two) times daily. Reported on 01/12/2016 02/12/16   Magdalen Spatz, NP  losartan (COZAAR) 25 MG tablet Take 1 tablet (25 mg total) by mouth daily. 02/11/16   Shirley Friar, PA-C  oxyCODONE-acetaminophen (PERCOCET) 5-325 MG tablet Take 1-2 tablets by mouth every 4 (four) hours as needed for severe pain. 07/24/15   Diona Browner, DDS  potassium chloride (K-DUR) 10 MEQ tablet Take 2 tablets (20 mEq total) by mouth as needed (When you take a torsemide).  01/12/16   Shirley Friar, PA-C  torsemide (DEMADEX) 20 MG tablet Take 2 tablets (40 mg total) by mouth as needed (as need for 3lbs weight gain overnight/ 5lb in a week/ SOB). 01/12/16   Satira Mccallum Tillery, PA-C   BP 157/110 mmHg  Pulse 114  Temp(Src) 97.5 F (36.4 C) (Axillary)  Resp 34  SpO2 89%  LMP  (LMP Unknown) Physical Exam  Constitutional: She appears well-developed and well-nourished. She appears distressed.  HENT:  Head: Normocephalic and atraumatic.  Eyes: Conjunctivae are normal.  Neck: Neck supple.  Cardiovascular: Regular rhythm and normal heart sounds.  Tachycardia present.   No murmur heard. Pulmonary/Chest: She is in respiratory distress.  Severe respiratory distress with accessory muscle use, crackles noted bilaterally  Abdominal: Soft. Bowel sounds are normal. She exhibits no distension. There is no tenderness.  Musculoskeletal: She exhibits no edema.  Neurological:  Sleepy but able to follow basic commands  Skin: Skin is warm. She is diaphoretic.  Nursing note and vitals reviewed.   ED Course  .Critical Care Performed by: Sharlett Iles Authorized by: Sharlett Iles Total critical care time: 45 minutes Critical care time was exclusive of separately billable procedures and treating other patients. Critical care was necessary to treat or prevent imminent or life-threatening deterioration of the following conditions: respiratory failure. Critical care was time spent personally by me on the following activities: development of treatment plan with patient or surrogate, evaluation of patient's response to treatment, examination of patient, obtaining history from patient or surrogate, ordering and performing treatments and interventions, ordering and review of laboratory studies, ordering and review of radiographic studies, pulse oximetry, re-evaluation of patient's condition and review of old charts.   (including critical care time) Labs  Review Labs Reviewed  CBC WITH DIFFERENTIAL/PLATELET - Abnormal; Notable for the following:    WBC 19.6 (*)    All other components within normal limits  I-STAT CG4 LACTIC ACID, ED - Abnormal; Notable for the following:    Lactic Acid, Venous 3.32 (*)    All other components within normal limits  I-STAT ARTERIAL BLOOD GAS, ED - Abnormal; Notable for the following:    pH, Arterial 7.130 (*)    pCO2 arterial 68.6 (*)    pO2, Arterial 324.0 (*)    Acid-base deficit 8.0 (*)    All other components within normal limits  CULTURE, BLOOD (ROUTINE X 2)  CULTURE, BLOOD (ROUTINE X 2)  URINE CULTURE  COMPREHENSIVE METABOLIC PANEL  BRAIN NATRIURETIC PEPTIDE  BLOOD GAS, ARTERIAL  URINALYSIS, ROUTINE W REFLEX MICROSCOPIC (NOT AT [facility])  Randolm Idol, ED    Imaging Review Dg Chest Portable 1 View  04/03/2016  CLINICAL DATA:  Acute onset of shortness of breath. Initial encounter. EXAM: PORTABLE CHEST 1 VIEW COMPARISON:  Chest radiograph performed 03/13/2014, and CTA of the chest performed 07/01/2014  FINDINGS: The lungs are well-aerated. Right-sided airspace opacification, most prominent at the right lung base, may reflect asymmetric pulmonary edema or pneumonia. There is no evidence of pleural effusion or pneumothorax. The cardiomediastinal silhouette is mildly enlarged. No acute osseous abnormalities are seen. IMPRESSION: Right-sided airspace opacification, most prominent at the right lung base, may reflect asymmetric pulmonary edema or pneumonia. Mild cardiomegaly noted. Electronically Signed   By: Garald Balding M.D.   On: 04/03/2016 07:01   I have personally reviewed and evaluated these images and lab results as part of my medical decision-making.   EKG Interpretation   Date/Time:  Sunday April 03 2016 06:31:44 EDT Ventricular Rate:  130 PR Interval:  154 QRS Duration: 112 QT Interval:  302 QTC Calculation: 444 R Axis:   65 Text Interpretation:  Sinus tachycardia Atrial premature complex  LAE,  consider biatrial enlargement Left ventricular hypertrophy Anterior  infarct, acute (LAD) Minimal ST elevation, inferior leads tachycardia and  ST depression new from previous Confirmed by Kyden Potash MD, Marqus Macphee [phone]) on  04/03/2016 6:34:33 AM     Medications  cefTRIAXone (ROCEPHIN) 1 g in dextrose 5 % 50 mL IVPB (1 g Intravenous New Bag/Given 04/03/16 0738)  azithromycin (ZITHROMAX) 500 mg in dextrose 5 % 250 mL IVPB (not administered)  nitroGLYCERIN 50 mg in dextrose 5 % 250 mL (0.2 mg/mL) infusion (50 mcg/min Intravenous New Bag/Given 04/03/16 0640)  magnesium sulfate IVPB 2 g 50 mL (0 g Intravenous Stopped 04/03/16 0732)  furosemide (LASIX) injection 60 mg (60 mg Intravenous Given 04/03/16 0734)    MDM   Final diagnoses:  None   Patient brought in by EMS for severe respiratory distress, history of CHF and COPD. On arrival, she was diaphoretic, severely dyspneic on BiPAP. Brought to trauma bay and placed on BiPAP. Gave IV magnesium and started nitroglycerin drip. Chest x-ray shows pulmonary edema. Given her crackles without significant wheezing, I suspect CHF exacerbation. The patient has already received Solu-Medrol by EMS. Obtained above lab work which showed ABG 7.13 with CO2 68. Lactate elevated at 3.32. WBC elevated at 19.6, and no recent hospitalizations in chart therefore gave ceftriaxone and azithromycin. Also gave 60mg  IV lasix. On multiple reexaminations, the patient has continued to improve from a work of breathing standpoint and her tachycardia is also improving. BP has improved on nitroglycerin. I am signing out to the oncoming provider. The patient will be admitted, likely to stepdown, for further treatment.  Sharlett Iles, MD 04/03/16 [phone]

## 2016-04-04 ENCOUNTER — Encounter (HOSPITAL_COMMUNITY): Payer: Self-pay | Admitting: Cardiology

## 2016-04-04 ENCOUNTER — Observation Stay (HOSPITAL_COMMUNITY): Payer: Medicare Other

## 2016-04-04 DIAGNOSIS — J13 Pneumonia due to Streptococcus pneumoniae: Secondary | ICD-10-CM | POA: Diagnosis present

## 2016-04-04 DIAGNOSIS — Z833 Family history of diabetes mellitus: Secondary | ICD-10-CM | POA: Diagnosis not present

## 2016-04-04 DIAGNOSIS — I5043 Acute on chronic combined systolic (congestive) and diastolic (congestive) heart failure: Secondary | ICD-10-CM

## 2016-04-04 DIAGNOSIS — J9602 Acute respiratory failure with hypercapnia: Secondary | ICD-10-CM | POA: Diagnosis not present

## 2016-04-04 DIAGNOSIS — Z9114 Patient's other noncompliance with medication regimen: Secondary | ICD-10-CM | POA: Diagnosis not present

## 2016-04-04 DIAGNOSIS — N183 Chronic kidney disease, stage 3 (moderate): Secondary | ICD-10-CM | POA: Diagnosis present

## 2016-04-04 DIAGNOSIS — Z825 Family history of asthma and other chronic lower respiratory diseases: Secondary | ICD-10-CM | POA: Diagnosis not present

## 2016-04-04 DIAGNOSIS — Z800 Family history of malignant neoplasm of digestive organs: Secondary | ICD-10-CM | POA: Diagnosis not present

## 2016-04-04 DIAGNOSIS — Z7951 Long term (current) use of inhaled steroids: Secondary | ICD-10-CM | POA: Diagnosis not present

## 2016-04-04 DIAGNOSIS — I10 Essential (primary) hypertension: Secondary | ICD-10-CM | POA: Diagnosis not present

## 2016-04-04 DIAGNOSIS — I340 Nonrheumatic mitral (valve) insufficiency: Secondary | ICD-10-CM

## 2016-04-04 DIAGNOSIS — R0602 Shortness of breath: Secondary | ICD-10-CM | POA: Diagnosis not present

## 2016-04-04 DIAGNOSIS — F17210 Nicotine dependence, cigarettes, uncomplicated: Secondary | ICD-10-CM | POA: Diagnosis present

## 2016-04-04 DIAGNOSIS — J189 Pneumonia, unspecified organism: Secondary | ICD-10-CM | POA: Diagnosis not present

## 2016-04-04 DIAGNOSIS — I5023 Acute on chronic systolic (congestive) heart failure: Secondary | ICD-10-CM

## 2016-04-04 DIAGNOSIS — A599 Trichomoniasis, unspecified: Secondary | ICD-10-CM | POA: Diagnosis present

## 2016-04-04 DIAGNOSIS — R8290 Unspecified abnormal findings in urine: Secondary | ICD-10-CM | POA: Diagnosis not present

## 2016-04-04 DIAGNOSIS — Z23 Encounter for immunization: Secondary | ICD-10-CM | POA: Diagnosis not present

## 2016-04-04 DIAGNOSIS — Y92039 Unspecified place in apartment as the place of occurrence of the external cause: Secondary | ICD-10-CM | POA: Diagnosis not present

## 2016-04-04 DIAGNOSIS — Z8249 Family history of ischemic heart disease and other diseases of the circulatory system: Secondary | ICD-10-CM | POA: Diagnosis not present

## 2016-04-04 DIAGNOSIS — A419 Sepsis, unspecified organism: Secondary | ICD-10-CM

## 2016-04-04 DIAGNOSIS — J9600 Acute respiratory failure, unspecified whether with hypoxia or hypercapnia: Secondary | ICD-10-CM

## 2016-04-04 DIAGNOSIS — R739 Hyperglycemia, unspecified: Secondary | ICD-10-CM | POA: Diagnosis not present

## 2016-04-04 DIAGNOSIS — A403 Sepsis due to Streptococcus pneumoniae: Secondary | ICD-10-CM | POA: Diagnosis present

## 2016-04-04 DIAGNOSIS — J9601 Acute respiratory failure with hypoxia: Secondary | ICD-10-CM | POA: Diagnosis not present

## 2016-04-04 DIAGNOSIS — N179 Acute kidney failure, unspecified: Secondary | ICD-10-CM | POA: Diagnosis not present

## 2016-04-04 DIAGNOSIS — E872 Acidosis: Secondary | ICD-10-CM | POA: Diagnosis not present

## 2016-04-04 DIAGNOSIS — J45909 Unspecified asthma, uncomplicated: Secondary | ICD-10-CM | POA: Diagnosis present

## 2016-04-04 DIAGNOSIS — I130 Hypertensive heart and chronic kidney disease with heart failure and stage 1 through stage 4 chronic kidney disease, or unspecified chronic kidney disease: Secondary | ICD-10-CM | POA: Diagnosis present

## 2016-04-04 DIAGNOSIS — T50906A Underdosing of unspecified drugs, medicaments and biological substances, initial encounter: Secondary | ICD-10-CM | POA: Diagnosis present

## 2016-04-04 DIAGNOSIS — N390 Urinary tract infection, site not specified: Secondary | ICD-10-CM | POA: Diagnosis present

## 2016-04-04 DIAGNOSIS — Z91128 Patient's intentional underdosing of medication regimen for other reason: Secondary | ICD-10-CM | POA: Diagnosis not present

## 2016-04-04 LAB — BASIC METABOLIC PANEL
Anion gap: 9 (ref 5–15)
BUN: 17 mg/dL (ref 6–20)
CALCIUM: 8.9 mg/dL (ref 8.9–10.3)
CHLORIDE: 104 mmol/L (ref 101–111)
CO2: 26 mmol/L (ref 22–32)
CREATININE: 1.15 mg/dL — AB (ref 0.44–1.00)
GFR, EST NON AFRICAN AMERICAN: 54 mL/min — AB (ref >60)
Glucose, Bld: 136 mg/dL — ABNORMAL HIGH (ref 65–99)
Potassium: 3.7 mmol/L (ref 3.5–5.1)
SODIUM: 139 mmol/L (ref 135–145)

## 2016-04-04 LAB — ECHOCARDIOGRAM COMPLETE
Height: 60 in
WEIGHTICAEL: 2768.98 [oz_av]

## 2016-04-04 LAB — PATHOLOGIST SMEAR REVIEW

## 2016-04-04 LAB — CBC WITH DIFFERENTIAL/PLATELET
BASOS ABS: 0 10*3/uL (ref 0.0–0.1)
BASOS PCT: 0 %
EOS ABS: 0 10*3/uL (ref 0.0–0.7)
EOS PCT: 0 %
HCT: 37.1 % (ref 36.0–46.0)
HEMOGLOBIN: 11.4 g/dL — AB (ref 12.0–15.0)
Lymphocytes Relative: 9 %
Lymphs Abs: 1.4 10*3/uL (ref 0.7–4.0)
MCH: 28.5 pg (ref 26.0–34.0)
MCHC: 30.7 g/dL (ref 30.0–36.0)
MCV: 92.8 fL (ref 78.0–100.0)
Monocytes Absolute: 1 10*3/uL (ref 0.1–1.0)
Monocytes Relative: 7 %
NEUTROS PCT: 84 %
Neutro Abs: 13 10*3/uL — ABNORMAL HIGH (ref 1.7–7.7)
PLATELETS: 294 10*3/uL (ref 150–400)
RBC: 4 MIL/uL (ref 3.87–5.11)
RDW: 13.7 % (ref 11.5–15.5)
WBC: 15.4 10*3/uL — AB (ref 4.0–10.5)

## 2016-04-04 LAB — URINE CULTURE: Special Requests: NORMAL

## 2016-04-04 LAB — GLUCOSE, CAPILLARY
GLUCOSE-CAPILLARY: 112 mg/dL — AB (ref 65–99)
GLUCOSE-CAPILLARY: 89 mg/dL (ref 65–99)
GLUCOSE-CAPILLARY: 88 mg/dL (ref 65–99)
Glucose-Capillary: 182 mg/dL — ABNORMAL HIGH (ref 65–99)
Glucose-Capillary: 101 mg/dL — ABNORMAL HIGH (ref 65–99)
Glucose-Capillary: 116 mg/dL — ABNORMAL HIGH (ref 65–99)

## 2016-04-04 LAB — HIV ANTIBODY (ROUTINE TESTING W REFLEX): HIV SCREEN 4TH GENERATION: NONREACTIVE

## 2016-04-04 LAB — HEMOGLOBIN A1C
Hgb A1c MFr Bld: 5 % (ref 4.8–5.6)
MEAN PLASMA GLUCOSE: 97 mg/dL

## 2016-04-04 MED ORDER — PNEUMOCOCCAL VAC POLYVALENT 25 MCG/0.5ML IJ INJ
0.5000 mL | INJECTION | INTRAMUSCULAR | Status: AC
  Administered 2016-04-05: 0.5 mL via INTRAMUSCULAR

## 2016-04-04 MED ORDER — SPIRONOLACTONE 25 MG PO TABS
25.0000 mg | ORAL_TABLET | Freq: Every day | ORAL | Status: DC
  Administered 2016-04-04 – 2016-04-08 (×5): 25 mg via ORAL
  Filled 2016-04-04 (×5): qty 1

## 2016-04-04 MED ORDER — LOSARTAN POTASSIUM 25 MG PO TABS
25.0000 mg | ORAL_TABLET | Freq: Every day | ORAL | Status: DC
  Administered 2016-04-04 – 2016-04-07 (×4): 25 mg via ORAL
  Filled 2016-04-04 (×4): qty 1

## 2016-04-04 MED ORDER — FUROSEMIDE 10 MG/ML IJ SOLN
40.0000 mg | Freq: Once | INTRAMUSCULAR | Status: AC
  Administered 2016-04-04: 40 mg via INTRAVENOUS
  Filled 2016-04-04: qty 4

## 2016-04-04 MED ORDER — FUROSEMIDE 10 MG/ML IJ SOLN
40.0000 mg | Freq: Two times a day (BID) | INTRAMUSCULAR | Status: DC
  Administered 2016-04-04 – 2016-04-05 (×2): 40 mg via INTRAVENOUS
  Filled 2016-04-04 (×2): qty 4

## 2016-04-04 MED ORDER — DIGOXIN 125 MCG PO TABS
0.1250 mg | ORAL_TABLET | Freq: Every day | ORAL | Status: DC
  Administered 2016-04-04 – 2016-04-08 (×5): 0.125 mg via ORAL
  Filled 2016-04-04 (×5): qty 1

## 2016-04-04 NOTE — Progress Notes (Addendum)
Patient ID: Stacey Lin, female   DOB: 10-22-1966, 50 y.o.   MRN: [id number]  PROGRESS NOTE    Stacey Lin  [id number] DOB: [birth year] DOA: 04/03/2016  PCP: Billee Cashing, MD   Brief Narrative:  50 y.o. female with medical history significant for chronic systolic and diastolic congestive heart failure followed by heart failure clinic, hypertension, mitral valve regurgitation, asthmatic bronchitis, history of cocaine abuse, tobacco use and noncompliance with medications. Apparently patient changed her diuretic from scheduled to as needed basis because of feeling weak. For past few days prior to this admission patient reports worsening shortness of breath with occasional associated dry nonproductive cough but no fevers or chills. No lower extremity swelling. At home she takes torsemide for congestive heart failure.  On admission, blood pressure 94/56 and repeat 157/110, heart rate 62-114, RR 9-34. Oxygen saturation was 89% on room air but with nasal cannula oxygen support it has improved to 100%. Blood work was notable for leukocytosis of 19.6, mild elevation in creatinine 1.41 and lactic acid of 2.1. She was given one dose of Lasix 40 mg IV.  Chest x-ray showed enlargement of cardiac silhouette with slight pulmonary vascular congestion, persistent asymmetric right lung infiltrate questionable pneumonia versus asymmetric edema. She was started on empiric antibiotics. Cardiology will see the patient in consultation. Of note her urinalysis showed large leukocytes with few bacteria and present Trichomonas for which reason she has received one dose of metronidazole.   Assessment & Plan:   Principal Problem:   Acute respiratory failure with hypoxia / acute on chronic combined systolic and diastolic congestive heart failure - Initial hypoxia likely from acute decompensated CHF. Chest x-ray on admission with what seems to be pulmonary vascular congestion versus pneumonia - Patient was given  Lasix 40 mg IV in ED - 2-D echo in 2016 showed ejection fraction of 55%. A 2-D echo on this admission is pending. - Appreciate cardiology consult for recommendations - Continue to monitor daily weights, strict intake and output and replete electrolytes as needed  Active Problems:   Sepsis secondary to pneumonia, unspecified organism / leukocytosis - Sepsis criteria met at the time of the admission with tachycardia, tachypnea, hypoxia, leukocytosis and lactic acidosis with presumed source of infection pneumonia. Chest x-ray shows pulmonary vascular congestion versus right lung infiltrate concerning for pneumonia - Patient started empirically on azithromycin and Rocephin - Blood cultures are pending    Trichomonas UTI - Has received dose of metronidazole on the admission    Essential hypertension, benign - Continue carvedilol 3.125 mg twice daily    Acute renal failure superimposed on chronic kidney disease stage III - Baseline creatinine about 2 months ago 1.02 and on this admission 1.41 likely secondary to sepsis - Creatinine now at baseline, 1.1, likely improved with fluids    Hyperglycemia - Likely stress related and due to sepsis - A1c is 5, please note that patient does not have diabetes    DVT prophylaxis: Lovenox subcutaneous Code Status: full code  Family Communication: No family at the bedside Disposition Plan: Home likely by 04/06/2016   Consultants:   Cardiology  Procedures:   2D Echo pending  Antimicrobials:   Azithromycin and Rocephin 04/03/2016 -->   Subjective: No overnight events.   Objective: Filed Vitals:   04/04/16 0200 04/04/16 0207 04/04/16 0346 04/04/16 0400  BP: 119/81   131/89  Pulse: 64   72  Temp:   97.7 F (36.5 C)   TempSrc:   Oral   Resp: 13  10  Height:      Weight:      SpO2: 100% 100%  98%    Intake/Output Summary (Last 24 hours) at 04/04/16 0749 Last data filed at 04/04/16 0200  Gross per 24 hour  Intake      0 ml    Output   3000 ml  Net  -3000 ml   Filed Weights   04/03/16 0944  Weight: 78.6 kg (173 lb 4.5 oz)    Examination:  General exam: Appears calm and comfortable  Respiratory system: Clear to auscultation. Respiratory effort normal. Cardiovascular system: S1 & S2 heard, RRR. (+) JVD Gastrointestinal system: Abdomen is nondistended, soft and nontender. No organomegaly or masses felt. Normal bowel sounds heard. Central nervous system: Alert and oriented. No focal neurological deficits. Extremities: Symmetric 5 x 5 power. Skin: No rashes, lesions or ulcers Psychiatry: Judgement and insight appear normal. Mood & affect appropriate.   Data Reviewed: I have personally reviewed following labs and imaging studies  CBC:  Recent Labs Lab 04/03/16 0636 04/03/16 1243 04/04/16 0430  WBC 19.6* 13.5* 15.4*  NEUTROABS 11.9* 12.9* 13.0*  HGB 13.7 12.5 11.4*  HCT 45.7 40.8 37.1  MCV 95.8 91.7 92.8  PLT 390 336 294   Basic Metabolic Panel:  Recent Labs Lab 04/03/16 0636 04/04/16 0430  NA 138 139  K 4.1 3.7  CL 106 104  CO2 22 26  GLUCOSE 349* 136*  BUN 15 17  CREATININE 1.41* 1.15*  CALCIUM 9.0 8.9   GFR: Estimated Creatinine Clearance: 53.6 mL/min (by C-G formula based on Cr of 1.15). Liver Function Tests:  Recent Labs Lab 04/03/16 0636  AST 34  ALT 30  ALKPHOS 115  BILITOT 0.7  PROT 7.0  ALBUMIN 3.7   No results for input(s): LIPASE, AMYLASE in the last 168 hours. No results for input(s): AMMONIA in the last 168 hours. Coagulation Profile: No results for input(s): INR, PROTIME in the last 168 hours. Cardiac Enzymes: No results for input(s): CKTOTAL, CKMB, CKMBINDEX, TROPONINI in the last 168 hours. BNP (last 3 results) No results for input(s): PROBNP in the last 8760 hours. HbA1C: No results for input(s): HGBA1C in the last 72 hours. CBG:  Recent Labs Lab 04/03/16 1231 04/03/16 1708 04/03/16 1928 04/03/16 2324 04/04/16 0343  GLUCAP 114* 134* 123* 87 112*    Lipid Profile: No results for input(s): CHOL, HDL, LDLCALC, TRIG, CHOLHDL, LDLDIRECT in the last 72 hours. Thyroid Function Tests: No results for input(s): TSH, T4TOTAL, FREET4, T3FREE, THYROIDAB in the last 72 hours. Anemia Panel: No results for input(s): VITAMINB12, FOLATE, FERRITIN, TIBC, IRON, RETICCTPCT in the last 72 hours. Urine analysis:    Component Value Date/Time   COLORURINE YELLOW 04/03/2016 0725   APPEARANCEUR CLOUDY* 04/03/2016 0725   LABSPEC 1.013 04/03/2016 0725   PHURINE 6.5 04/03/2016 0725   GLUCOSEU 500* 04/03/2016 0725   HGBUR SMALL* 04/03/2016 0725   BILIRUBINUR NEGATIVE 04/03/2016 0725   KETONESUR NEGATIVE 04/03/2016 0725   PROTEINUR 100* 04/03/2016 0725   UROBILINOGEN 0.2 03/14/2014 0045   NITRITE NEGATIVE 04/03/2016 0725   LEUKOCYTESUR LARGE* 04/03/2016 0725   Sepsis Labs: @LABRCNTIP (procalcitonin:4,lacticidven:4)   Recent Results (from the past 240 hour(s))  MRSA PCR Screening     Status: None   Collection Time: 04/03/16 12:07 PM  Result Value Ref Range Status   MRSA by PCR NEGATIVE NEGATIVE Final      Radiology Studies: Dg Chest Portable 1 View 04/03/2016  Right-sided airspace opacification, most prominent at the right lung  base, may reflect asymmetric pulmonary edema or pneumonia. Mild cardiomegaly noted.     Scheduled Meds: . azithromycin  500 mg Oral Q24H  . carvedilol  3.125 mg Oral BID WC  . cefTRIAXone (ROCEPHIN)  IV  1 g Intravenous Q24H  . enoxaparin (LOVENOX) injection  40 mg Subcutaneous Q24H  . fluticasone furoate-vilanterol  1 puff Inhalation BID  . insulin aspart  0-9 Units Subcutaneous Q4H  . metroNIDAZOLE  2,000 mg Oral Once   Continuous Infusions:    LOS: 1 day    Time spent: 25 minutes  Greater than 50% of the time spent on counseling and coordinating the care.   Manson Passey, MD [facility] Pager [phone]  If 7PM-7AM, please contact night-coverage [email or web address] Password [facility] 04/04/2016, 7:49 AM

## 2016-04-04 NOTE — Progress Notes (Signed)
NIV check delayed, Therapist in CODE x5  (3MW/42M/5C)

## 2016-04-04 NOTE — Care Management Obs Status (Addendum)
[facility] NOTIFICATION   Patient Details  Name: Stacey Lin MRN: [id number] Date of Birth: Apr 14, 1966   Medicare Observation Status Notification Given:  Yes MOON/CC44 given to and signed by pt; copy to [facility] for scanning into medical records   Erenest Rasher, RN 04/04/2016, 10:54 AM

## 2016-04-04 NOTE — Progress Notes (Signed)
Heart Failure Navigator Consult Note  Presentation: Stacey Lin is a 50 yo female with a history of anemia, combined systolic/diastolic heart failure, HTN, and history of polysubstance abuse. Initial ECHO (04/2010) EF 50-55% with grd 1 DD, LVIDd 12mm, mild to mod MR, RA and LA mildly dilated- last seen in HF clinic 02/10/16.   Pt had stopped all her meds when her bottles had run out because she felt well. Over the last week she had increasing SOB and DOE, could hardly take a bath due to SOB. No lower edema but fullness in her face and abd. She had a cough, no fever she was aware of but hot. Some chest discomfort, seems worse with deep breath. Yesterday so severe thought she was going to die. She does not remember anything after EMS arrived. BNP was 775.   Past Medical History  Diagnosis Date  . Hypertension   . Anemia   . Arrhythmia   . Hypercholesteremia   . Chronic headaches   . Asthma   . Anginal pain ([place])     2 months  . CHF (congestive heart failure) ([place])     1990s  . COPD (chronic obstructive pulmonary disease) ([place])     ??    Social History   Social History  . Marital Status: Legally Separated    Spouse Name: N/A  . Number of Children: N/A  . Years of Education: N/A   Social History Main Topics  . Smoking status: Current Some Day Smoker -- 0.10 packs/day for 20 years    Types: Cigarettes  . Smokeless tobacco: Never Used     Comment: Smokes occasionally when someone has a cig to have, doesn't buy them.  . Alcohol Use: No  . Drug Use: No     Comment: former, stopped smoking crack X 5 years ago  . Sexual Activity: Not Asked   Other Topics Concern  . None   Social History Narrative   Lives with son in an apartment on the third floor.  Does not work.  On disability.  Previously worked in [employer].    Education: high school.      ECHO: new pending  BNP    Component Value Date/Time   BNP 775.0* 04/03/2016 0636    ProBNP    Component Value  Date/Time   PROBNP 130.6* 03/13/2014 2317     Education Assessment and Provision:  Detailed education and instructions provided on heart failure disease management including the following:  Signs and symptoms of Heart Failure When to call the physician Importance of daily weights Low sodium diet Fluid restriction Medication management Anticipated future follow-up appointments  Patient education given on each of the above topics.  Patient acknowledges understanding and acceptance of all instructions.  I know Ms Boardman from [facility] and spoke with her regarding this hospitalization.  She realizes that she "messed up" by stopping all medications.  She says she felt so well that she hoped that she was "better".  We discussed how she would likely need to take her medications for the rest of her life in order to maintain her health.  She admits that she does not weigh daily --yet says she has been losing weight recently.   I encouraged daily weights and reviewed how weight increases relate to the signs and symptoms of HF.  She also admits to "loving" added table salt.  I reviewed a low sodium diet and high sodium foods to avoid.  She tells me that she will  need to make major diet adjustments as she "is now Diabetic" as well.  She denies any issues with getting or taking prescribed medications--only that she quit taking all meds before admission because she felt so well.  She will follow in the AHF Clinic after discharge.   Education Materials:  "Living Better With Heart Failure" Booklet, Daily Weight Tracker Tool  High Risk Criteria for Readmission and/or Poor Patient Outcomes:   EF <30%- new pending  2 or more admissions in 6 months- No  Difficult social situation- No  Demonstrates medication noncompliance- She recently stopped all meds--denies any issues affording  Barriers of Care:  Knowledge and compliance.  Discharge Planning:   Plans to return to home to apt in [place].  She  has a boyfriend that provides support as needed.

## 2016-04-04 NOTE — Consult Note (Signed)
Reason for Consult: acute CHF   Referring Physician:  Dr. Charlies Silvers  PCP:  Ricke Hey, MD  Primary Cardiologist:Dr. Dylana Shaw  Stacey Lin is an 50 y.o. female.    Chief Complaint: admitted yesterday with SOB.    HPI:  Asked to see 50 yo female with a history of anemia, combined systolic/diastolic heart failure, HTN, and history of polysubstance abuse. Initial ECHO (04/2010) EF 50-55% with grd 1 DD, LVIDd 49m, mild to mod MR, RA and LA mildly dilated- last seen in HF clinic 02/10/16.    Pt had stopped all her meds when her bottles had run out because she felt well.  Over the last week she had increasing SOB and DOE, could hardly take a bath due to SOB.  No lower edema but fullness in her face and abd. She had a cough, no fever she was aware of but hot.  Some chest discomfort, seems worse with deep breath.  Yesterday so severe thought she was going to die.  She does not remember anything after EMS arrived.   BNP was 775.   She was given BiPap with respiratory failure SP02 of 60%, improved to 80% with BiPap.  She was given Solumedrol, lasix, IV magnesium, and IV NTG drip.  Also ABX.   Her Lactic acid was 3.32.   Now admitted with Acute respiratory failure yesterday due to possible CHF and PNA.  And dx. of sepsis with tachycardia, tachypnea, hypoxia, leukocytosis and lactic acidosis with presumed source of infection pneumonia.   Both CXRs yesterday and today with asymmetric edema or infection.  WBC is elevated at 15, has been afebrile.   Negative 3150 today after Lasix 60 and then 40 IV.  Wt down 3 oz. (last office wt 178 01/2016) now 173 lbs.  Previous studies: Studies ECHO 08/20/13: EF 30-35%, global HK, LVIDd 62 mm, moderate mitral regurg with rheumatic appearing MV , RA and LA severely dilated, mod/severe TR, peak PA 44 cMRI 08/27/13: EF 32% RV mildly dilated, no scar or infiltrative disease. Mod MR and Mod-sev TR Echo (1/15): EF 45%, mild to moderate AI, moderate MR,  RV normal size with mildly decreased systolic function.  CPX (5/15): FVC 66%, FEV1 59%, ratio 72%, peak VO2 15.7, slope 22.6 => mildly decreased functional capacity limited primarily by lungs.  Echo (05/2014): EF 55-60%, mild/mod AI, grade I DD and mild MR R/LHC (06/06/14): ectatic coronaries (suspect d/t HTN) no significant arthrosclerosis and normal EF. No significant MR noted Echo (7/16): EF 60-65%, moderate AI.  Echo (11/16): EF 55%, Mild LAE ECHO ordered.  Past Medical History  Diagnosis Date  . Hypertension   . Anemia   . Arrhythmia   . Hypercholesteremia   . Chronic headaches   . Asthma   . Anginal pain ([facility]     2 months  . CHF (congestive heart failure) ([facility]     1990s  . COPD (chronic obstructive pulmonary disease) ([facility]     ??    Past Surgical History  Procedure Laterality Date  . Tubal ligation  1986  . Left and right heart catheterization with coronary angiogram N/A 06/06/2014    Procedure: LEFT AND RIGHT HEART CATHETERIZATION WITH CORONARY ANGIOGRAM;  Surgeon: DJolaine Artist MD;  [facility];  Service: Cardiovascular;  Laterality: N/A;  . Multiple extractions with alveoloplasty Bilateral 07/24/2015    Procedure: MULTIPLE EXTRACTIONS WITH ALVEOLOPLASTY,  BILATERAL TORI ;  Surgeon: SDiona Browner DDS;  [facility]  Service: Oral Surgery;  Laterality: Bilateral;    Family History  Problem Relation Age of Onset  . Hypertension Mother   . Hypertension Father     Deceased  . Hypertension Maternal Grandmother   . Allergies Mother   . Asthma Mother   . Heart disease Mother   . Stomach cancer Mother     Deceased, 13  . Seizures Mother   . Healthy Brother   . Healthy Son   . Healthy Daughter    Social History:  reports that she has been smoking Cigarettes.  She has a 2 pack-year smoking history. She has never used smokeless tobacco. She reports that she does not drink alcohol or use illicit drugs.  Allergies: No Known Allergies  OUTPATIENT  MEDICATIONS: Was not taking any outpatient medications.    CURRENT MEDICATIONS: Scheduled Meds: . azithromycin  500 mg Oral Q24H  . carvedilol  3.125 mg Oral BID WC  . cefTRIAXone (ROCEPHIN)  IV  1 g Intravenous Q24H  . enoxaparin (LOVENOX) injection  40 mg Subcutaneous Q24H  . fluticasone furoate-vilanterol  1 puff Inhalation BID  . insulin aspart  0-9 Units Subcutaneous Q4H  . metroNIDAZOLE  2,000 mg Oral Once  . [START ON 04/05/2016] pneumococcal 23 valent vaccine  0.5 mL Intramuscular Tomorrow-1000  . sodium chloride flush  3 mL Intravenous Q12H   Continuous Infusions:  PRN Meds:.sodium chloride, acetaminophen, albuterol, hydrALAZINE, ondansetron (ZOFRAN) IV, oxyCODONE-acetaminophen, sodium chloride flush   Results for orders placed or performed during the hospital encounter of 04/03/16 (from the past 48 hour(s))  Comprehensive metabolic panel     Status: Abnormal   Collection Time: 04/03/16  6:36 AM  Result Value Ref Range   Sodium 138 135 - 145 mmol/L   Potassium 4.1 3.5 - 5.1 mmol/L   Chloride 106 101 - 111 mmol/L   CO2 22 22 - 32 mmol/L   Glucose, Bld 349 (H) 65 - 99 mg/dL   BUN 15 6 - 20 mg/dL   Creatinine, Ser 1.41 (H) 0.44 - 1.00 mg/dL   Calcium 9.0 8.9 - 10.3 mg/dL   Total Protein 7.0 6.5 - 8.1 g/dL   Albumin 3.7 3.5 - 5.0 g/dL   AST 34 15 - 41 U/L   ALT 30 14 - 54 U/L   Alkaline Phosphatase 115 38 - 126 U/L   Total Bilirubin 0.7 0.3 - 1.2 mg/dL   GFR calc non Af Amer 42 (L) >60 mL/min   GFR calc Af Amer 49 (L) >60 mL/min    Comment: (NOTE) The eGFR has been calculated using the CKD EPI equation. This calculation has not been validated in all clinical situations. eGFR's persistently <60 mL/min signify possible Chronic Kidney Disease.    Anion gap 10 5 - 15  Brain natriuretic peptide     Status: Abnormal   Collection Time: 04/03/16  6:36 AM  Result Value Ref Range   B Natriuretic Peptide 775.0 (H) 0.0 - 100.0 pg/mL  CBC with Differential     Status:  Abnormal   Collection Time: 04/03/16  6:36 AM  Result Value Ref Range   WBC 19.6 (H) 4.0 - 10.5 K/uL   RBC 4.77 3.87 - 5.11 MIL/uL   Hemoglobin 13.7 12.0 - 15.0 g/dL   HCT 45.7 36.0 - 46.0 %   MCV 95.8 78.0 - 100.0 fL   MCH 28.7 26.0 - 34.0 pg   MCHC 30.0 30.0 - 36.0 g/dL   RDW 13.7 11.5 - 15.5 %   Platelets  390 150 - 400 K/uL   Neutrophils Relative % 61 %   Lymphocytes Relative 33 %   Monocytes Relative 4 %   Eosinophils Relative 2 %   Basophils Relative 0 %   Neutro Abs 11.9 (H) 1.7 - 7.7 K/uL   Lymphs Abs 6.5 (H) 0.7 - 4.0 K/uL   Monocytes Absolute 0.8 0.1 - 1.0 K/uL   Eosinophils Absolute 0.4 0.0 - 0.7 K/uL   Basophils Absolute 0.0 0.0 - 0.1 K/uL   RBC Morphology POLYCHROMASIA PRESENT     Comment: TARGET CELLS  I-stat troponin, ED     Status: None   Collection Time: 04/03/16  6:44 AM  Result Value Ref Range   Troponin i, poc 0.03 0.00 - 0.08 ng/mL   Comment 3            Comment: Due to the release kinetics of cTnI, a negative result within the first hours of the onset of symptoms does not rule out myocardial infarction with certainty. If myocardial infarction is still suspected, repeat the test at appropriate intervals.   I-Stat CG4 Lactic Acid, ED     Status: Abnormal   Collection Time: 04/03/16  6:46 AM  Result Value Ref Range   Lactic Acid, Venous 3.32 (HH) 0.5 - 2.0 mmol/L   Comment NOTIFIED PHYSICIAN   I-Stat arterial blood gas, ED     Status: Abnormal   Collection Time: 04/03/16  6:56 AM  Result Value Ref Range   pH, Arterial 7.130 (LL) 7.350 - 7.450   pCO2 arterial 68.6 (HH) 35.0 - 45.0 mmHg   pO2, Arterial 324.0 (H) 80.0 - 100.0 mmHg   Bicarbonate 22.8 20.0 - 24.0 mEq/L   TCO2 25 0 - 100 mmol/L   O2 Saturation 100.0 %   Acid-base deficit 8.0 (H) 0.0 - 2.0 mmol/L   Patient temperature 98.6 F    Collection site RADIAL, ALLEN'S TEST ACCEPTABLE    Drawn by RT    Sample type ARTERIAL    Comment NOTIFIED PHYSICIAN   Urine culture     Status: Abnormal    Collection Time: 04/03/16  7:25 AM  Result Value Ref Range   Specimen Description URINE, CLEAN CATCH    Special Requests Normal    Culture MULTIPLE SPECIES PRESENT, SUGGEST RECOLLECTION (A)    Report Status 04/04/2016 FINAL   Urinalysis, Routine w reflex microscopic     Status: Abnormal   Collection Time: 04/03/16  7:25 AM  Result Value Ref Range   Color, Urine YELLOW YELLOW   APPearance CLOUDY (A) CLEAR   Specific Gravity, Urine 1.013 1.005 - 1.030   pH 6.5 5.0 - 8.0   Glucose, UA 500 (A) NEGATIVE mg/dL   Hgb urine dipstick SMALL (A) NEGATIVE   Bilirubin Urine NEGATIVE NEGATIVE   Ketones, ur NEGATIVE NEGATIVE mg/dL   Protein, ur 100 (A) NEGATIVE mg/dL   Nitrite NEGATIVE NEGATIVE   Leukocytes, UA LARGE (A) NEGATIVE  Urine microscopic-add on     Status: Abnormal   Collection Time: 04/03/16  7:25 AM  Result Value Ref Range   Squamous Epithelial / LPF 6-30 (A) NONE SEEN   WBC, UA 6-30 0 - 5 WBC/hpf   RBC / HPF 0-5 0 - 5 RBC/hpf   Bacteria, UA FEW (A) NONE SEEN   Trichomonas, UA PRESENT   I-Stat arterial blood gas, ED     Status: Abnormal   Collection Time: 04/03/16  8:51 AM  Result Value Ref Range   pH, Arterial 7.362 7.350 -  7.450   pCO2 arterial 38.3 35.0 - 45.0 mmHg   pO2, Arterial 86.0 80.0 - 100.0 mmHg   Bicarbonate 21.7 20.0 - 24.0 mEq/L   TCO2 23 0 - 100 mmol/L   O2 Saturation 96.0 %   Acid-base deficit 3.0 (H) 0.0 - 2.0 mmol/L   Patient temperature HIDE    Sample type ARTERIAL   Hemoglobin A1c     Status: None   Collection Time: 04/03/16  9:04 AM  Result Value Ref Range   Hgb A1c MFr Bld 5.0 4.8 - 5.6 %    Comment: (NOTE)         Pre-diabetes: 5.7 - 6.4         Diabetes: >6.4         Glycemic control for adults with diabetes: <7.0    Mean Plasma Glucose 97 mg/dL    Comment: (NOTE) Performed At: [facility] [place], [place] [id number] Lindon Romp MD FY:[id number]   CBG monitoring, ED     Status: Abnormal   Collection Time:  04/03/16  9:39 AM  Result Value Ref Range   Glucose-Capillary 233 (H) 65 - 99 mg/dL   Comment 1 Notify RN   Lactic acid, plasma     Status: None   Collection Time: 04/03/16 11:34 AM  Result Value Ref Range   Lactic Acid, Venous 1.5 0.5 - 2.0 mmol/L  Procalcitonin - Baseline     Status: None   Collection Time: 04/03/16 11:35 AM  Result Value Ref Range   Procalcitonin 0.53 ng/mL    Comment:        Interpretation: PCT > 0.5 ng/mL and <= 2 ng/mL: Systemic infection (sepsis) is possible, but other conditions are known to elevate PCT as well. (NOTE)         ICU PCT Algorithm               Non ICU PCT Algorithm    ----------------------------     ------------------------------         PCT < 0.25 ng/mL                 PCT < 0.1 ng/mL     Stopping of antibiotics            Stopping of antibiotics       strongly encouraged.               strongly encouraged.    ----------------------------     ------------------------------       PCT level decrease by               PCT < 0.25 ng/mL       >= 80% from peak PCT       OR PCT 0.25 - 0.5 ng/mL          Stopping of antibiotics                                             encouraged.     Stopping of antibiotics           encouraged.    ----------------------------     ------------------------------       PCT level decrease by              PCT >= 0.25 ng/mL       < 80% from peak PCT  AND PCT >= 0.5 ng/mL             Continuing antibiotics                                              encouraged.       Continuing antibiotics            encouraged.    ----------------------------     ------------------------------     PCT level increase compared          PCT > 0.5 ng/mL         with peak PCT AND          PCT >= 0.5 ng/mL             Escalation of antibiotics                                          strongly encouraged.      Escalation of antibiotics        strongly encouraged.   MRSA PCR Screening     Status: None   Collection Time:  04/03/16 12:07 PM  Result Value Ref Range   MRSA by PCR NEGATIVE NEGATIVE    Comment:        The GeneXpert MRSA Assay (FDA approved for NASAL specimens only), is one component of a comprehensive MRSA colonization surveillance program. It is not intended to diagnose MRSA infection nor to guide or monitor treatment for MRSA infections. Performed at [place]   Glucose, capillary     Status: Abnormal   Collection Time: 04/03/16 12:31 PM  Result Value Ref Range   Glucose-Capillary 114 (H) 65 - 99 mg/dL   Comment 1 Notify RN   Lactic acid, plasma     Status: Abnormal   Collection Time: 04/03/16 12:43 PM  Result Value Ref Range   Lactic Acid, Venous 2.1 (HH) 0.5 - 2.0 mmol/L    Comment: CRITICAL RESULT CALLED TO, READ BACK BY AND VERIFIED WITH: CHRISMON,S RN 04/03/16 1414 WOOTEN,K   CBC with Differential/Platelet     Status: Abnormal   Collection Time: 04/03/16 12:43 PM  Result Value Ref Range   WBC 13.5 (H) 4.0 - 10.5 K/uL   RBC 4.45 3.87 - 5.11 MIL/uL   Hemoglobin 12.5 12.0 - 15.0 g/dL   HCT 40.8 36.0 - 46.0 %   MCV 91.7 78.0 - 100.0 fL   MCH 28.1 26.0 - 34.0 pg   MCHC 30.6 30.0 - 36.0 g/dL   RDW 13.5 11.5 - 15.5 %   Platelets 336 150 - 400 K/uL   Neutrophils Relative % 96 %   Neutro Abs 12.9 (H) 1.7 - 7.7 K/uL   Lymphocytes Relative 3 %   Lymphs Abs 0.4 (L) 0.7 - 4.0 K/uL   Monocytes Relative 1 %   Monocytes Absolute 0.1 0.1 - 1.0 K/uL   Eosinophils Relative 0 %   Eosinophils Absolute 0.0 0.0 - 0.7 K/uL   Basophils Relative 0 %   Basophils Absolute 0.0 0.0 - 0.1 K/uL  Lactic acid, plasma     Status: None   Collection Time: 04/03/16  4:33 PM  Result Value Ref Range   Lactic Acid, Venous 1.4 0.5 - 2.0 mmol/L  Glucose, capillary  Status: Abnormal   Collection Time: 04/03/16  5:08 PM  Result Value Ref Range   Glucose-Capillary 134 (H) 65 - 99 mg/dL  Lactic acid, plasma     Status: None   Collection Time: 04/03/16  6:47 PM  Result Value Ref  Range   Lactic Acid, Venous 1.7 0.5 - 2.0 mmol/L  Glucose, capillary     Status: Abnormal   Collection Time: 04/03/16  7:28 PM  Result Value Ref Range   Glucose-Capillary 123 (H) 65 - 99 mg/dL  Glucose, capillary     Status: None   Collection Time: 04/03/16 11:24 PM  Result Value Ref Range   Glucose-Capillary 87 65 - 99 mg/dL  Glucose, capillary     Status: Abnormal   Collection Time: 04/04/16  3:43 AM  Result Value Ref Range   Glucose-Capillary 112 (H) 65 - 99 mg/dL  Basic metabolic panel     Status: Abnormal   Collection Time: 04/04/16  4:30 AM  Result Value Ref Range   Sodium 139 135 - 145 mmol/L   Potassium 3.7 3.5 - 5.1 mmol/L   Chloride 104 101 - 111 mmol/L   CO2 26 22 - 32 mmol/L   Glucose, Bld 136 (H) 65 - 99 mg/dL   BUN 17 6 - 20 mg/dL   Creatinine, Ser 1.15 (H) 0.44 - 1.00 mg/dL   Calcium 8.9 8.9 - 10.3 mg/dL   GFR calc non Af Amer 54 (L) >60 mL/min   GFR calc Af Amer >60 >60 mL/min    Comment: (NOTE) The eGFR has been calculated using the CKD EPI equation. This calculation has not been validated in all clinical situations. eGFR's persistently <60 mL/min signify possible Chronic Kidney Disease.    Anion gap 9 5 - 15  CBC with Differential/Platelet     Status: Abnormal   Collection Time: 04/04/16  4:30 AM  Result Value Ref Range   WBC 15.4 (H) 4.0 - 10.5 K/uL   RBC 4.00 3.87 - 5.11 MIL/uL   Hemoglobin 11.4 (L) 12.0 - 15.0 g/dL   HCT 37.1 36.0 - 46.0 %   MCV 92.8 78.0 - 100.0 fL   MCH 28.5 26.0 - 34.0 pg   MCHC 30.7 30.0 - 36.0 g/dL   RDW 13.7 11.5 - 15.5 %   Platelets 294 150 - 400 K/uL   Neutrophils Relative % 84 %   Neutro Abs 13.0 (H) 1.7 - 7.7 K/uL   Lymphocytes Relative 9 %   Lymphs Abs 1.4 0.7 - 4.0 K/uL   Monocytes Relative 7 %   Monocytes Absolute 1.0 0.1 - 1.0 K/uL   Eosinophils Relative 0 %   Eosinophils Absolute 0.0 0.0 - 0.7 K/uL   Basophils Relative 0 %   Basophils Absolute 0.0 0.0 - 0.1 K/uL  Glucose, capillary     Status: None    Collection Time: 04/04/16  7:58 AM  Result Value Ref Range   Glucose-Capillary 89 65 - 99 mg/dL   Comment 1 Notify RN    Dg Chest 2 View  04/04/2016  CLINICAL DATA:  Acute respiratory failure with hypoxia and hypercarbia, shortness of breath, history asthma, COPD, CHF EXAM: CHEST  2 VIEW COMPARISON:  04/03/2016 FINDINGS: Enlargement of cardiac silhouette. Slight pulmonary vascular congestion. Mediastinal contours normal. Persistent asymmetric RIGHT lung infiltrate question infection versus asymmetric edema. No pleural effusion or pneumothorax. IMPRESSION: Enlargement of cardiac silhouette with slight pulmonary vascular congestion. Persistent asymmetric RIGHT lung infiltrate question pneumonia versus asymmetric edema. Electronically Signed  By: Lavonia Dana M.D.   On: 04/04/2016 07:52   Dg Chest Portable 1 View  04/03/2016  CLINICAL DATA:  Acute onset of shortness of breath. Initial encounter. EXAM: PORTABLE CHEST 1 VIEW COMPARISON:  Chest radiograph performed 03/13/2014, and CTA of the chest performed 07/01/2014 FINDINGS: The lungs are well-aerated. Right-sided airspace opacification, most prominent at the right lung base, may reflect asymmetric pulmonary edema or pneumonia. There is no evidence of pleural effusion or pneumothorax. The cardiomediastinal silhouette is mildly enlarged. No acute osseous abnormalities are seen. IMPRESSION: Right-sided airspace opacification, most prominent at the right lung base, may reflect asymmetric pulmonary edema or pneumonia. Mild cardiomegaly noted. Electronically Signed   By: Garald Balding M.D.   On: 04/03/2016 07:01    ROS: General:no colds or fevers, no weight changes- she does not weigh Skin:no rashes or ulcers HEENT:no blurred vision, no congestion CV:see HPI PUL:see HPI GI:no diarrhea +  constipation or melena, no indigestion GU:no hematuria, no dysuria MS:no joint pain, no claudication Neuro:no syncope, no lightheadedness Endo:no hx diabetes, no  thyroid disease now hyperglycemic   Blood pressure 128/114, pulse 84, temperature 98 F (36.7 C), temperature source Oral, resp. rate 14, height 5' (1.524 m), weight 173 lb 4.5 oz (78.6 kg), SpO2 98 %.  Wt Readings from Last 3 Encounters:  04/03/16 173 lb 4.5 oz (78.6 kg)  02/12/16 178 lb (80.74 kg)  02/11/16 178 lb (80.74 kg)    PE: General:Pleasant affect, NAD, tearful with thinking about yestrerday Skin:Warm and dry, brisk capillary refill HEENT:normocephalic, sclera clear, mucus membranes moist Neck:supple, + JVD to jaw, no bruits  Heart: PMI laterally displaced S1S2 tachy regular +s3. 3/6 MR Lungs:diminished on Rt with rales and occ rhonchi, no wheezes WKM:QKMM, non tender, + BS, do not palpate liver spleen or masses Ext:1+ lower ext edema, 2+ pedal pulses, 2+ radial pulses Neuro:alert and oriented X 3, MAE, follows commands, + facial symmetry    Assessment/Plan Principal Problem:   Acute respiratory failure with hypoxia and hypercarbia (HCC) Active Problems:   Essential hypertension, benign   Mitral valve regurgitation   AKI (acute kidney injury) ([place])   History of Chronic combined systolic and diastolic congestive heart failure (HCC)   CAP (community acquired pneumonia)   Elevated lactic acid level   Leukocytosis   New onset type 2 diabetes mellitus (HCC)   Abnormal urinalysis   Trichomonas infection  CAP- on antibiotics and per IM  Acute on chronic systolic and diastolic HF. ( Last echo with improved EF to 55%)-  Responded to lasix.  Cardiac silhouette appears greater on CXR- pt for echo. - pt needs lasix to continue - Dr. Haroldine Laws to see -pt was off all HF meds, now back on coreg 3.125 BID -previous losartan has not been resumed due to acute renal injury on admit   MVR to repeat echo today  AKI improving today 1.15  Down from 1.41   Hyperglycemia  On SSI with Hgb A1C of 5.0- possible elevation to sepsis and acute respiratory failure.  HTN controlled   Hx  of cardiac cath with ectatic coronary arteries no disease. .   Non compliance discussed continuing meds.   Tobacco use 1-2 cigarettes per day but none last 4 days.  She believes she has stopped.      Stacey Lin  Nurse Practitioner Certified [place] Pager [phone] or after 5pm or weekends call [phone] 04/04/2016, 11:26 AM    Patient seen and examined with Stacey Kicks, NP-C.  We discussed all aspects of the encounter. I agree with the assessment and plan as stated above.   Suspect majority of symptoms related to severe, recurrent systolic HF in setting of medication non-compliance. On exam she is quite tachycardic with s3 gallop and significant MR. Neck veins are up and has mild edema. Echo pending but CXR shows markedly elarged cardiac silhouette and suspect there will be severe LV dilation and dysfunction.   Continue VI lasix. Add losartan, spiro and digoxin. Stop carvedilol until better compensated. Continue to treat for possibility of concurrent PNA but doubt this is a major factor here.   Stacey Blatchford,MD 2:12 PM

## 2016-04-04 NOTE — Progress Notes (Signed)
Pt is off NIV at this time titrated to a 2L [place] tolerating it well no distress or complications noted.

## 2016-04-04 NOTE — Progress Notes (Signed)
Patient remains off of BIPAP with no complications. No distress noted. Nurse aware. BIPAP on standby if needed.

## 2016-04-04 NOTE — Progress Notes (Signed)
Patient remains off of BIPAP with no complications on 2 liters with a spo2 of 98%. No distress noted, BIPAP on standby if needed.

## 2016-04-04 NOTE — Care Management Note (Signed)
Case Management Note  Patient Details  Name: Mirza Hron MRN: [id number] Date of Birth: 06/22/1966  Subjective/Objective:     CHF, respiratory distress, HTN               Action/Plan: Discharge Planning:  NCM spoke to pt and states she lives at home alone. She just move into her new one bedroom apt. States she does not have oxygen at home. Will evaluate pt for home oxygen needs prior to dc. States she can afford her medications but was not compliant with medication regimen. NCM educated pt on the compliance of taking medications as prescribed. Will continue to follow for dc needs. Sent referral to CHF RN for education on CHF.   PCP Ricke Hey MD   Expected Discharge Date:                  Expected Discharge Plan:  Home/Self Care  In-House Referral:  NA  Discharge planning Services  CM Consult  Post Acute Care Choice:  NA Choice offered to:  NA  DME Arranged:  N/A DME Agency:  NA  HH Arranged:  NA HH Agency:  NA  Status of Service:  Completed, signed off  Medicare Important Message Given:    Date Medicare IM Given:    Medicare IM give by:    Date Additional Medicare IM Given:    Additional Medicare Important Message give by:     If discussed at [name] of Stay Meetings, dates discussed:    Additional Comments:  Erenest Rasher, RN 04/04/2016, 10:49 AM

## 2016-04-04 NOTE — Progress Notes (Addendum)
Inpatient Diabetes Program Recommendations  AACE/ADA: New Consensus Statement on Inpatient Glycemic Control (2015)  Target Ranges:  Prepandial:   less than 140 mg/dL      Peak postprandial:   less than 180 mg/dL (1-2 hours)      Critically ill patients:  140 - 180 mg/dL   Lab Results  Component Value Date   GLUCAP 89 04/04/2016   HGBA1C 5.0 04/03/2016    Review of Glycemic Control  Diabetes history: none Consult received for "new-onset diabetes".  Patient received Solumedrol 125 mg IV prior to arrival via EMS which likely caused CBG >300.   A1C is 5%.   Pre-diabetes: 5.7 - 6.4      Diabetes: >6.4   Inpatient Diabetes Program Recommendations: HgbA1C: =5% Would definitely follow CBGs but unsure of diagnosis at this time.  Will communicate with physician. Thank you  Raoul Pitch MSN, RN,CDE Inpatient Diabetes Coordinator [phone] (team pager)

## 2016-04-04 NOTE — Progress Notes (Signed)
  Echocardiogram 2D Echocardiogram has been performed.  Stacey Lin 04/04/2016, 3:10 PM

## 2016-04-05 DIAGNOSIS — A403 Sepsis due to Streptococcus pneumoniae: Secondary | ICD-10-CM | POA: Diagnosis not present

## 2016-04-05 DIAGNOSIS — N179 Acute kidney failure, unspecified: Secondary | ICD-10-CM

## 2016-04-05 DIAGNOSIS — I5023 Acute on chronic systolic (congestive) heart failure: Secondary | ICD-10-CM | POA: Diagnosis present

## 2016-04-05 LAB — BASIC METABOLIC PANEL
Anion gap: 7 (ref 5–15)
BUN: 27 mg/dL — ABNORMAL HIGH (ref 6–20)
CHLORIDE: 101 mmol/L (ref 101–111)
CO2: 31 mmol/L (ref 22–32)
CREATININE: 1.45 mg/dL — AB (ref 0.44–1.00)
Calcium: 9.4 mg/dL (ref 8.9–10.3)
GFR calc non Af Amer: 41 mL/min — ABNORMAL LOW (ref >60)
GFR, EST AFRICAN AMERICAN: 47 mL/min — AB (ref >60)
Glucose, Bld: 102 mg/dL — ABNORMAL HIGH (ref 65–99)
POTASSIUM: 3.5 mmol/L (ref 3.5–5.1)
Sodium: 139 mmol/L (ref 135–145)

## 2016-04-05 LAB — CBC
HCT: 42.2 % (ref 36.0–46.0)
HEMOGLOBIN: 12.9 g/dL (ref 12.0–15.0)
MCH: 28.3 pg (ref 26.0–34.0)
MCHC: 30.6 g/dL (ref 30.0–36.0)
MCV: 92.5 fL (ref 78.0–100.0)
PLATELETS: 362 10*3/uL (ref 150–400)
RBC: 4.56 MIL/uL (ref 3.87–5.11)
RDW: 13.9 % (ref 11.5–15.5)
WBC: 12.1 10*3/uL — AB (ref 4.0–10.5)

## 2016-04-05 LAB — CARBOXYHEMOGLOBIN
CARBOXYHEMOGLOBIN: 1.4 % (ref 0.5–1.5)
METHEMOGLOBIN: 1 % (ref 0.0–1.5)
O2 SAT: 63.6 %
Total hemoglobin: 14.4 g/dL (ref 12.0–16.0)

## 2016-04-05 LAB — GLUCOSE, CAPILLARY
GLUCOSE-CAPILLARY: 109 mg/dL — AB (ref 65–99)
GLUCOSE-CAPILLARY: 93 mg/dL (ref 65–99)
GLUCOSE-CAPILLARY: 128 mg/dL — AB (ref 65–99)
GLUCOSE-CAPILLARY: 102 mg/dL — AB (ref 65–99)
Glucose-Capillary: 163 mg/dL — ABNORMAL HIGH (ref 65–99)
Glucose-Capillary: 112 mg/dL — ABNORMAL HIGH (ref 65–99)
Glucose-Capillary: 211 mg/dL — ABNORMAL HIGH (ref 65–99)

## 2016-04-05 LAB — PROCALCITONIN: Procalcitonin: 0.93 ng/mL

## 2016-04-05 MED ORDER — SODIUM CHLORIDE 0.9% FLUSH: 10.0000 mL | INTRAVENOUS | Status: DC | PRN

## 2016-04-05 MED ORDER — FUROSEMIDE 10 MG/ML IJ SOLN
40.0000 mg | Freq: Two times a day (BID) | INTRAMUSCULAR | Status: DC
  Administered 2016-04-05 – 2016-04-06 (×2): 40 mg via INTRAVENOUS
  Filled 2016-04-05 (×2): qty 4

## 2016-04-05 MED ORDER — FUROSEMIDE 10 MG/ML IJ SOLN
80.0000 mg | INTRAMUSCULAR | Status: AC
  Administered 2016-04-05: 80 mg via INTRAVENOUS
  Filled 2016-04-05: qty 8

## 2016-04-05 MED ORDER — POTASSIUM CHLORIDE CRYS ER 20 MEQ PO TBCR
20.0000 meq | EXTENDED_RELEASE_TABLET | Freq: Once | ORAL | Status: AC
  Administered 2016-04-05: 20 meq via ORAL
  Filled 2016-04-05: qty 1

## 2016-04-05 MED ORDER — KETOROLAC TROMETHAMINE 30 MG/ML IJ SOLN
INTRAMUSCULAR | Status: AC
  Filled 2016-04-05: qty 1

## 2016-04-05 MED ORDER — KETOROLAC TROMETHAMINE 30 MG/ML IJ SOLN
30.0000 mg | Freq: Once | INTRAMUSCULAR | Status: AC
  Administered 2016-04-05: 30 mg via INTRAVENOUS

## 2016-04-05 MED ORDER — SODIUM CHLORIDE 0.9% FLUSH
10.0000 mL | Freq: Two times a day (BID) | INTRAVENOUS | Status: DC
  Administered 2016-04-05: 20 mL
  Administered 2016-04-06 (×2): 10 mL
  Administered 2016-04-07: 20 mL
  Administered 2016-04-08: 10 mL

## 2016-04-05 NOTE — Progress Notes (Signed)
Advanced Heart Failure Rounding Note   Subjective:    Admitted with volume overload in the setting of medication compliance.  Diuresed with IV lasix. Brisk diuresis noted. Weight down 4 pounds.   Complaining of increased dyspnea and headache.   Creatinine 1.15>1.45  Objective:   Weight Range:  Vital Signs:   Temp:  [97.3 F (36.3 C)-98.2 F (36.8 C)] 97.8 F (36.6 C) (06/13 1146) Pulse Rate:  [69-95] 69 (06/13 1147) Resp:  [12-26] 12 (06/13 1147) BP: (99-171)/(75-133) 128/87 mmHg (06/13 1147) SpO2:  [93 %-100 %] 95 % (06/13 1147) Weight:  [166 lb 3.6 oz (75.4 kg)] 166 lb 3.6 oz (75.4 kg) (06/13 0500) Last BM Date: 04/01/16  Weight change: Filed Weights   04/03/16 0944 04/04/16 1100 04/05/16 0500  Weight: 173 lb 4.5 oz (78.6 kg) 173 lb 1 oz (78.5 kg) 166 lb 3.6 oz (75.4 kg)    Intake/Output:   Intake/Output Summary (Last 24 hours) at 04/05/16 1302 Last data filed at 04/05/16 1139  Gross per 24 hour  Intake    700 ml  Output   3700 ml  Net  -3000 ml     Physical Exam: General:  Dyspneic at rest.  HEENT: normal Neck: supple. JVP to jaw . Carotids 2+ bilat; no bruits. No lymphadenopathy or thryomegaly appreciated. Cor: PMI nondisplaced. Regular rate & rhythm. No rubs, gallops or murmurs. Lungs: RML RLL crackles. On 4 liters oxygen.  Abdomen: soft, nontender, nondistended. No hepatosplenomegaly. No bruits or masses. Good bowel sounds. Extremities: no cyanosis, clubbing, rash, edema. R and LLE cool.  Neuro: alert & orientedx3, cranial nerves grossly intact. moves all 4 extremities w/o difficulty. Affect pleasant  Telemetry: NSR 90s  Labs: Basic Metabolic Panel:  Recent Labs Lab 04/03/16 0636 04/04/16 0430 04/05/16 0302  NA 138 139 139  K 4.1 3.7 3.5  CL 106 104 101  CO2 22 26 31   GLUCOSE 349* 136* 102*  BUN 15 17 27*  CREATININE 1.41* 1.15* 1.45*  CALCIUM 9.0 8.9 9.4    Liver Function Tests:  Recent Labs Lab 04/03/16 0636  AST 34  ALT 30    ALKPHOS 115  BILITOT 0.7  PROT 7.0  ALBUMIN 3.7   No results for input(s): LIPASE, AMYLASE in the last 168 hours. No results for input(s): AMMONIA in the last 168 hours.  CBC:  Recent Labs Lab 04/03/16 0636 04/03/16 1243 04/04/16 0430 04/05/16 0302  WBC 19.6* 13.5* 15.4* 12.1*  NEUTROABS 11.9* 12.9* 13.0*  --   HGB 13.7 12.5 11.4* 12.9  HCT 45.7 40.8 37.1 42.2  MCV 95.8 91.7 92.8 92.5  PLT 390 336 294 362    Cardiac Enzymes: No results for input(s): CKTOTAL, CKMB, CKMBINDEX, TROPONINI in the last 168 hours.  BNP: BNP (last 3 results)  Recent Labs  08/24/15 1115 01/12/16 1114 04/03/16 0636  BNP 238.7* 137.6* 775.0*    ProBNP (last 3 results) No results for input(s): PROBNP in the last 8760 hours.    Other results:  Imaging: Dg Chest 2 View  04/04/2016  CLINICAL DATA:  Acute respiratory failure with hypoxia and hypercarbia, shortness of breath, history asthma, COPD, CHF EXAM: CHEST  2 VIEW COMPARISON:  04/03/2016 FINDINGS: Enlargement of cardiac silhouette. Slight pulmonary vascular congestion. Mediastinal contours normal. Persistent asymmetric RIGHT lung infiltrate question infection versus asymmetric edema. No pleural effusion or pneumothorax. IMPRESSION: Enlargement of cardiac silhouette with slight pulmonary vascular congestion. Persistent asymmetric RIGHT lung infiltrate question pneumonia versus asymmetric edema. Electronically Signed  By: Lavonia Dana M.D.   On: 04/04/2016 07:52      Medications:     Scheduled Medications: . azithromycin  500 mg Oral Q24H  . cefTRIAXone (ROCEPHIN)  IV  1 g Intravenous Q24H  . digoxin  0.125 mg Oral Daily  . enoxaparin (LOVENOX) injection  40 mg Subcutaneous Q24H  . fluticasone furoate-vilanterol  1 puff Inhalation BID  . furosemide  40 mg Intravenous BID  . insulin aspart  0-9 Units Subcutaneous Q4H  . losartan  25 mg Oral Daily  . sodium chloride flush  3 mL Intravenous Q12H  . spironolactone  25 mg Oral Daily      Infusions:     PRN Medications:  sodium chloride, acetaminophen, albuterol, hydrALAZINE, ondansetron (ZOFRAN) IV, oxyCODONE-acetaminophen, sodium chloride flush   Assessment:  1. A/C Systolic Heart Failure  2.? RUL Pneumonia 3. HTN 4. DM 5. H/O Polysubstance Abuse 6.  Noncompliance   Plan/Discussion:    Admitted with volume overload in the setting of medication noncompliance. ECHO repeated with EF down to 20-25%. In November 2016 EF had recovered to 55%.   Volume status elevated. Give 80 mg IV lasix now and additional 40 mg tonight.  Creatinine trending up. 1.15>1.45. No bb. Place PICC . Continue losartan 25 mg twice a day. Renal function trending up. Will likely need short term inotropes. She is not a candidate for long intropes with history on substance abuse.   CXR ? RUL pneumonia. WBC 19.6. Today WBC down to 12.1. On antibiotics per PCP.   Length of Stay: 2   Amy [name] NP-C  04/05/2016, 1:02 PM  Advanced Heart Failure Team Pager [phone] (M-F; 7a - 4p)  Please contact [facility] for night-coverage after hours (4p -7a ) and weekends on [email or web address]   Patient seen and examined with Darrick Grinder, NP. We discussed all aspects of the encounter. I agree with the assessment and plan as stated above.   Echo images reviewed personally EF 20% with severe central MR. She is clinically worse today. Remains volume overloaded. Renal function worse.  I am concerned about low output physiology. Continue IV diuresis. Place PICC line to check CVP and co-ox. May need inotropes.   Freeland Pracht,MD 8:30 PM

## 2016-04-05 NOTE — Progress Notes (Signed)
Physician notified: Charlies Silvers At: 1432  Regarding: FYI Pt c/o intractable HA, Percocet not helping. Cried r/t HA pain while HF team at bedside.

## 2016-04-05 NOTE — Progress Notes (Signed)
I delivered a scale for daily weights at home as Stacey Lin said she needed one.  I asked again if she could afford medications at the time of discharge and she says it will not be a problem.  I asked her to call if she had any issues or concerns related to her HF and reminded her that she will have an appt quickly after discharge.

## 2016-04-05 NOTE — Progress Notes (Signed)
Peripherally Inserted Central Catheter/Midline Placement  The IV Nurse has discussed with the patient and/or persons authorized to consent for the patient, the purpose of this procedure and the potential benefits and risks involved with this procedure.  The benefits include less needle sticks, lab draws from the catheter and patient may be discharged home with the catheter.  Risks include, but not limited to, infection, bleeding, blood clot (thrombus formation), and puncture of an artery; nerve damage and irregular heat beat.  Alternatives to this procedure were also discussed.  PICC/Midline Placement Documentation        Stacey Lin 04/05/2016, 5:22 PM

## 2016-04-05 NOTE — Progress Notes (Signed)
Patient ID: Stacey Lin, female   DOB: 1965-12-23, 50 y.o.   MRN: [id number]  PROGRESS NOTE    Claudine Akopyan  WUJ:[id number] DOB: 1966-10-22 DOA: 04/03/2016  PCP: Billee Cashing, MD   Brief Narrative:  50 y.o. female with medical history significant for chronic systolic and diastolic congestive heart failure followed by heart failure clinic, hypertension, mitral valve regurgitation, asthmatic bronchitis, history of cocaine abuse, tobacco use and noncompliance with medications. Pt had stopped all her meds when her bottles had run out because she felt well. For past few days prior to this admission patient reports worsening shortness of breath with occasional associated dry nonproductive cough but no fevers or chills. No lower extremity swelling.   On admission, blood pressure 94/56 and repeat 157/110, heart rate 62-114, RR 9-34. Oxygen saturation was 89% on room air but with nasal cannula oxygen support it has improved to 100%. Blood work was notable for leukocytosis of 19.6, mild elevation in creatinine 1.41 and lactic acid of 2.1. She was given one dose of Lasix 40 mg IV.  Chest x-ray showed enlargement of cardiac silhouette with slight pulmonary vascular congestion, persistent asymmetric right lung infiltrate questionable pneumonia versus asymmetric edema. She was started on empiric antibiotics. Of note her urinalysis showed large leukocytes with few bacteria and present Trichomonas for which reason she has received one dose of metronidazole.   Assessment & Plan:   Principal Problem:   Acute respiratory failure with hypoxia / acute on chronic combined systolic and diastolic congestive heart failure - Initial hypoxia likely from acute decompensated CHF. Chest x-ray on admission with pulmonary vascular congestion versus pneumonia - Patient was given Lasix 40 mg IV in ED - Seen by cardio in consultation - 2-D echo on this admission with ejection fraction 20-25% with diffuse  hypokinesis - Recommendation to continue VI lasix 40 mg IV twice a day - Added digoxin, spironolactone and losartan and stopped coreg 6/12 until CHF better compensated per cardiology  - Continue strict intake and output and daily weight - Weight on 04/03/2016 with 78.6 kg this morning 75.4 kg - Monitor renal function while patient on IV Lasix, replete electrolytes as needed   Active Problems:   Sepsis secondary to pneumonia, unspecified organism / leukocytosis - Sepsis criteria met on admission with tachycardia, tachypnea, hypoxia, leukocytosis and lactic acidosis with presumed source of infection pneumonia. Chest x-ray shows pulmonary vascular congestion versus right lung infiltrate concerning for pneumonia - Patient started empirically on azithromycin and Rocephin - Blood cultures negative so far    Trichomonas UTI - Has received dose of metronidazole on the admission    Essential hypertension, benign - Stooped carvedilol per cardio until CHF better compensated - Continue Lasix, losartan, spironolactone    Acute renal failure superimposed on chronic kidney disease stage III - Baseline creatinine about 2 months ago 1.02 and on this admission 1.41 likely secondary to sepsis - Creatinine down to 1.1 and then this am 1.45, likely from lasix - Continue to monitor renal function while patient on IV Lasix    Hyperglycemia - Likely stress related and due to sepsis - A1c is 5, please note that patient does not have diabetes    DVT prophylaxis: Lovenox subcutaneous Code Status: full code  Family Communication: pt aunt at the bedside Disposition Plan: Home likely by 04/06/2016   Consultants:   Cardiology  Procedures:   2D Echo pending  Antimicrobials:   Azithromycin and Rocephin 04/03/2016 -->   Subjective: No overnight events.   Objective: Filed  Vitals:   04/05/16 0340 04/05/16 0500 04/05/16 0757 04/05/16 0844  BP: 99/75     Pulse: 83     Temp:    97.3 F (36.3 C)   TempSrc:    Oral  Resp: 16     Height:      Weight:  75.4 kg (166 lb 3.6 oz)    SpO2: 100%  94%     Intake/Output Summary (Last 24 hours) at 04/05/16 1128 Last data filed at 04/05/16 0900  Gross per 24 hour  Intake    960 ml  Output   3100 ml  Net  -2140 ml   Filed Weights   04/03/16 0944 04/04/16 1100 04/05/16 0500  Weight: 78.6 kg (173 lb 4.5 oz) 78.5 kg (173 lb 1 oz) 75.4 kg (166 lb 3.6 oz)    Examination:  General exam: No acute distress Respiratory system: no wheezing, no rhonchi  Cardiovascular system: S1 & S2 (+), rate controlled, (+) JVD Gastrointestinal system: (+) BS, non tender  Central nervous system: No focal neurological deficits. Extremities: no swelling, palpable pulses  Skin: warm and dry  Psychiatry: Mood & affect normal   Data Reviewed: I have personally reviewed following labs and imaging studies  CBC:  Recent Labs Lab 04/03/16 0636 04/03/16 1243 04/04/16 0430 04/05/16 0302  WBC 19.6* 13.5* 15.4* 12.1*  NEUTROABS 11.9* 12.9* 13.0*  --   HGB 13.7 12.5 11.4* 12.9  HCT 45.7 40.8 37.1 42.2  MCV 95.8 91.7 92.8 92.5  PLT 390 336 294 362   Basic Metabolic Panel:  Recent Labs Lab 04/03/16 0636 04/04/16 0430 04/05/16 0302  NA 138 139 139  K 4.1 3.7 3.5  CL 106 104 101  CO2 22 26 31   GLUCOSE 349* 136* 102*  BUN 15 17 27*  CREATININE 1.41* 1.15* 1.45*  CALCIUM 9.0 8.9 9.4   GFR: Estimated Creatinine Clearance: 41.7 mL/min (by C-G formula based on Cr of 1.45). Liver Function Tests:  Recent Labs Lab 04/03/16 0636  AST 34  ALT 30  ALKPHOS 115  BILITOT 0.7  PROT 7.0  ALBUMIN 3.7   No results for input(s): LIPASE, AMYLASE in the last 168 hours. No results for input(s): AMMONIA in the last 168 hours. Coagulation Profile: No results for input(s): INR, PROTIME in the last 168 hours. Cardiac Enzymes: No results for input(s): CKTOTAL, CKMB, CKMBINDEX, TROPONINI in the last 168 hours. BNP (last 3 results) No results for input(s):  PROBNP in the last 8760 hours. HbA1C:  Recent Labs  04/03/16 0904  HGBA1C 5.0   CBG:  Recent Labs Lab 04/04/16 1459 04/04/16 1945 04/04/16 2247 04/05/16 0338 04/05/16 0842  GLUCAP 101* 116* 88 109* 93   Lipid Profile: No results for input(s): CHOL, HDL, LDLCALC, TRIG, CHOLHDL, LDLDIRECT in the last 72 hours. Thyroid Function Tests: No results for input(s): TSH, T4TOTAL, FREET4, T3FREE, THYROIDAB in the last 72 hours. Anemia Panel: No results for input(s): VITAMINB12, FOLATE, FERRITIN, TIBC, IRON, RETICCTPCT in the last 72 hours. Urine analysis:    Component Value Date/Time   COLORURINE YELLOW 04/03/2016 0725   APPEARANCEUR CLOUDY* 04/03/2016 0725   LABSPEC 1.013 04/03/2016 0725   PHURINE 6.5 04/03/2016 0725   GLUCOSEU 500* 04/03/2016 0725   HGBUR SMALL* 04/03/2016 0725   BILIRUBINUR NEGATIVE 04/03/2016 0725   KETONESUR NEGATIVE 04/03/2016 0725   PROTEINUR 100* 04/03/2016 0725   UROBILINOGEN 0.2 03/14/2014 0045   NITRITE NEGATIVE 04/03/2016 0725   LEUKOCYTESUR LARGE* 04/03/2016 0725   Sepsis Labs: @LABRCNTIP (procalcitonin:4,lacticidven:4)  Recent Results (from the past 240 hour(s))  MRSA PCR Screening     Status: None   Collection Time: 04/03/16 12:07 PM  Result Value Ref Range Status   MRSA by PCR NEGATIVE NEGATIVE Final      Radiology Studies: Dg Chest Portable 1 View 04/03/2016  Right-sided airspace opacification, most prominent at the right lung base, may reflect asymmetric pulmonary edema or pneumonia. Mild cardiomegaly noted.     Scheduled Meds: . azithromycin  500 mg Oral Q24H  . carvedilol  3.125 mg Oral BID WC  . cefTRIAXone (ROCEPHIN)  IV  1 g Intravenous Q24H  . enoxaparin (LOVENOX) injection  40 mg Subcutaneous Q24H  . fluticasone furoate-vilanterol  1 puff Inhalation BID  . insulin aspart  0-9 Units Subcutaneous Q4H  . metroNIDAZOLE  2,000 mg Oral Once   Continuous Infusions:    LOS: 2 days    Time spent: 25 minutes  Greater than  50% of the time spent on counseling and coordinating the care.   Manson Passey, MD [facility] Pager [phone]  If 7PM-7AM, please contact night-coverage [email or web address] Password [facility] 04/05/2016, 11:28 AM

## 2016-04-05 NOTE — Care Management Important Message (Signed)
Important Message  Patient Details  Name: Stacey Lin MRN: [id number] Date of Birth: [birth year]   Medicare Important Message Given:  Yes    Loann Quill 04/05/2016, 8:43 AM

## 2016-04-06 DIAGNOSIS — E872 Acidosis: Secondary | ICD-10-CM

## 2016-04-06 DIAGNOSIS — I10 Essential (primary) hypertension: Secondary | ICD-10-CM

## 2016-04-06 DIAGNOSIS — R8290 Unspecified abnormal findings in urine: Secondary | ICD-10-CM

## 2016-04-06 DIAGNOSIS — D72829 Elevated white blood cell count, unspecified: Secondary | ICD-10-CM

## 2016-04-06 DIAGNOSIS — A599 Trichomoniasis, unspecified: Secondary | ICD-10-CM

## 2016-04-06 LAB — BASIC METABOLIC PANEL
Anion gap: 8 (ref 5–15)
BUN: 21 mg/dL — ABNORMAL HIGH (ref 6–20)
CALCIUM: 9.3 mg/dL (ref 8.9–10.3)
CO2: 31 mmol/L (ref 22–32)
CREATININE: 1.25 mg/dL — AB (ref 0.44–1.00)
Chloride: 97 mmol/L — ABNORMAL LOW (ref 101–111)
GFR calc Af Amer: 57 mL/min — ABNORMAL LOW (ref >60)
GFR calc non Af Amer: 49 mL/min — ABNORMAL LOW (ref >60)
GLUCOSE: 107 mg/dL — AB (ref 65–99)
Potassium: 3.8 mmol/L (ref 3.5–5.1)
Sodium: 136 mmol/L (ref 135–145)

## 2016-04-06 LAB — CBC
HCT: 44 % (ref 36.0–46.0)
HEMOGLOBIN: 13.3 g/dL (ref 12.0–15.0)
MCH: 28.3 pg (ref 26.0–34.0)
MCHC: 30.2 g/dL (ref 30.0–36.0)
MCV: 93.6 fL (ref 78.0–100.0)
PLATELETS: 363 10*3/uL (ref 150–400)
RBC: 4.7 MIL/uL (ref 3.87–5.11)
RDW: 13.7 % (ref 11.5–15.5)
WBC: 10.6 10*3/uL — ABNORMAL HIGH (ref 4.0–10.5)

## 2016-04-06 LAB — GLUCOSE, CAPILLARY
GLUCOSE-CAPILLARY: 133 mg/dL — AB (ref 65–99)
GLUCOSE-CAPILLARY: 99 mg/dL (ref 65–99)
GLUCOSE-CAPILLARY: 111 mg/dL — AB (ref 65–99)
Glucose-Capillary: 117 mg/dL — ABNORMAL HIGH (ref 65–99)
Glucose-Capillary: 176 mg/dL — ABNORMAL HIGH (ref 65–99)

## 2016-04-06 LAB — STREP PNEUMONIAE URINARY ANTIGEN: Strep Pneumo Urinary Antigen: POSITIVE — AB

## 2016-04-06 LAB — CARBOXYHEMOGLOBIN
Carboxyhemoglobin: 2.5 % — ABNORMAL HIGH (ref 0.5–1.5)
Methemoglobin: 0.7 % (ref 0.0–1.5)
O2 Saturation: 99.1 %
Total hemoglobin: 14.5 g/dL (ref 12.0–16.0)

## 2016-04-06 NOTE — Progress Notes (Signed)
Advanced Heart Failure Rounding Note   Subjective:    Admitted with volume overload in the setting of medication compliance.    Diuresing IV lasix. PICC placed.  CO-OX 64%.   Complaining of increased dyspnea and headache.   Creatinine 1.15>1.45 >1.2  Objective:   Weight Range:  Vital Signs:   Temp:  [97.6 F (36.4 C)-98.1 F (36.7 C)] 97.6 F (36.4 C) (06/14 0840) Pulse Rate:  [69-98] 98 (06/14 0840) Resp:  [11-26] 18 (06/14 0840) BP: (116-142)/(74-91) 142/91 mmHg (06/14 0840) SpO2:  [93 %-100 %] 96 % (06/14 0840) Weight:  [168 lb 3.4 oz (76.3 kg)] 168 lb 3.4 oz (76.3 kg) (06/14 0432) Last BM Date: 04/05/16  Weight change: Filed Weights   04/04/16 1100 04/05/16 0500 04/06/16 0432  Weight: 173 lb 1 oz (78.5 kg) 166 lb 3.6 oz (75.4 kg) 168 lb 3.4 oz (76.3 kg)    Intake/Output:   Intake/Output Summary (Last 24 hours) at 04/06/16 1022 Last data filed at 04/06/16 0845  Gross per 24 hour  Intake    460 ml  Output   1950 ml  Net  -1490 ml     Physical Exam: General:  Dyspneic at rest.  HEENT: normal Neck: supple. JVP to jaw . Carotids 2+ bilat; no bruits. No lymphadenopathy or thryomegaly appreciated. Cor: PMI nondisplaced. Regular rate & rhythm. No rubs, gallops or murmurs. Lungs: RML RLL crackles. On 4 liters oxygen.  Abdomen: soft, nontender, nondistended. No hepatosplenomegaly. No bruits or masses. Good bowel sounds. Extremities: no cyanosis, clubbing, rash, edema. R and LLE cool.  Neuro: alert & orientedx3, cranial nerves grossly intact. moves all 4 extremities w/o difficulty. Affect pleasant  Telemetry: NSR 90s  Labs: Basic Metabolic Panel:  Recent Labs Lab 04/03/16 0636 04/04/16 0430 04/05/16 0302 04/06/16 0459  NA 138 139 139 136  K 4.1 3.7 3.5 3.8  CL 106 104 101 97*  CO2 22 26 31 31   GLUCOSE 349* 136* 102* 107*  BUN 15 17 27* 21*  CREATININE 1.41* 1.15* 1.45* 1.25*  CALCIUM 9.0 8.9 9.4 9.3    Liver Function Tests:  Recent Labs Lab  04/03/16 0636  AST 34  ALT 30  ALKPHOS 115  BILITOT 0.7  PROT 7.0  ALBUMIN 3.7   No results for input(s): LIPASE, AMYLASE in the last 168 hours. No results for input(s): AMMONIA in the last 168 hours.  CBC:  Recent Labs Lab 04/03/16 0636 04/03/16 1243 04/04/16 0430 04/05/16 0302 04/06/16 0459  WBC 19.6* 13.5* 15.4* 12.1* 10.6*  NEUTROABS 11.9* 12.9* 13.0*  --   --   HGB 13.7 12.5 11.4* 12.9 13.3  HCT 45.7 40.8 37.1 42.2 44.0  MCV 95.8 91.7 92.8 92.5 93.6  PLT 390 336 294 362 363    Cardiac Enzymes: No results for input(s): CKTOTAL, CKMB, CKMBINDEX, TROPONINI in the last 168 hours.  BNP: BNP (last 3 results)  Recent Labs  08/24/15 1115 01/12/16 1114 04/03/16 0636  BNP 238.7* 137.6* 775.0*    ProBNP (last 3 results) No results for input(s): PROBNP in the last 8760 hours.    Other results:  Imaging: No results found.   Medications:     Scheduled Medications: . azithromycin  500 mg Oral Q24H  . cefTRIAXone (ROCEPHIN)  IV  1 g Intravenous Q24H  . digoxin  0.125 mg Oral Daily  . enoxaparin (LOVENOX) injection  40 mg Subcutaneous Q24H  . fluticasone furoate-vilanterol  1 puff Inhalation BID  . furosemide  40 mg Intravenous  BID  . insulin aspart  0-9 Units Subcutaneous Q4H  . losartan  25 mg Oral Daily  . sodium chloride flush  10-40 mL Intracatheter Q12H  . sodium chloride flush  3 mL Intravenous Q12H  . spironolactone  25 mg Oral Daily    Infusions:    PRN Medications: sodium chloride, acetaminophen, albuterol, hydrALAZINE, ondansetron (ZOFRAN) IV, oxyCODONE-acetaminophen, sodium chloride flush, sodium chloride flush   Assessment:  1. A/C Systolic Heart Failure  2.? RUL Pneumonia 3. HTN 4. DM 5. H/O Polysubstance Abuse 6.  Noncompliance   Plan/Discussion:    Admitted with volume overload in the setting of medication noncompliance. ECHO repeated with EF down to 20-25%. In November 2016 EF had recovered to 55%.   Volume status stable.  CVP 2-3. Stop IV lasix. CO-OX 64%. No BB for now. Creatinine trending down 1.45>1.25. Continue losartan 25 mg twice a day, digoxin 0.125 mg daily, and spiro 25 mg daily.   She is not a candidate for long intropes with history on substance abuse.   CXR ? RUL pneumonia. WBC trending down 19.6>10.6 .  On antibiotics per PCP.   Refer to HF paramedicine.   Length of Stay: 3   Amy [name] NP-C  04/06/2016, 10:22 AM  Advanced Heart Failure Team Pager [phone] (M-F; 7a - 4p)  Please contact [facility] for night-coverage after hours (4p -7a ) and weekends on [email or web address]  Patient seen and examined with Darrick Grinder, NP. We discussed all aspects of the encounter. I agree with the assessment and plan as stated above.   Volume status and co-ox look good. Can stop IV lasix. No need for inotropes at this point. Continue current HF meds. May be able to switch to [facility] tomorrow if she can afford. No b-blocker yet.    Bensimhon, Daniel,MD 6:01 PM

## 2016-04-06 NOTE — Progress Notes (Signed)
Progress Note    Stacey Lin  XHB:[id number] DOB: 1966-06-19  DOA: 04/03/2016 PCP: Billee Cashing, MD    Brief Narrative:   Stacey Lin is an 50 y.o. female with medical history significant for chronic systolic and diastolic congestive heart failure followed by heart failure clinic, hypertension, mitral valve regurgitation, asthmatic bronchitis, history of cocaine abuse, tobacco use and noncompliance with medications. Pt had stopped all her meds when her bottles had run out because she felt well. For past few days prior to this admission patient reports worsening shortness of breath with occasional associated dry nonproductive cough but no fevers or chills. No lower extremity swelling.   On admission, blood pressure 94/56 and repeat 157/110, heart rate 62-114, RR 9-34. Oxygen saturation was 89% on room air but with nasal cannula oxygen support it has improved to 100%. Blood work was notable for leukocytosis of 19.6, mild elevation in creatinine 1.41 and lactic acid of 2.1. She was given one dose of Lasix 40 mg IV.  Chest x-ray showed enlargement of cardiac silhouette with slight pulmonary vascular congestion, persistent asymmetric right lung infiltrate questionable pneumonia versus asymmetric edema. She was started on empiric antibiotics. Of note her urinalysis showed large leukocytes with few bacteria and present Trichomonas for which reason she has received one dose of metronidazole.  Assessment/Plan:   Principal Problem:  Acute respiratory failure with hypoxia / acute on chronic combined systolic and diastolic congestive heart failure Initial hypoxia likely from acute decompensated CHF. Chest x-ray on admission with pulmonary vascular congestion versus pneumonia. Patient Has been successfully diuresed. Cardiology and the heart failure team continue to follow her. 2-D echocardiogram showed significant systolic dysfunction with an EF of 20-25 percent/diffuse hypokinesis. She is  currently now on spironolactone, Cozaar, hydralazine and digoxin. Lasix now discontinued. PICC line placed to monitor CVP and initiate inotropes if needed.  Active Problems:  Sepsis secondary to pneumonia, unspecified organism / leukocytosis Sepsis criteria met on admission with tachycardia, tachypnea, hypoxia, leukocytosis and lactic acidosis with presumed source of infection: pneumonia. Chest x-ray shows pulmonary vascular congestion versus right lung infiltrate concerning for pneumonia. Patient started empirically on azithromycin and Rocephin, continue for now.  WBC improving. Blood cultures negative so far.   Trichomonas UTI Treated with single dose metronidazole.   Essential hypertension, benign Carvedilol on hold secondary to acute decompensated heart failure. Lasix now discontinued. Continue spironolactone, losartan, and hydralazine.   Acute renal failure superimposed on chronic kidney disease stage III Baseline creatinine about 2 months ago 1.02 and on this admission 1.41 likely secondary to sepsis versus diuretic therapy.   Hyperglycemia Likely stress related and due to sepsis. A1c is 5, which is not consistent with diabetes.  Family Communication/Anticipated D/C date and plan/Code Status   DVT prophylaxis: Lovenox ordered. Code Status: Full Code.  Family Communication: No family present at the bedside. Disposition Plan: Home when cleared for discharge by cardiology/heart failure team, likely several more days.   Medical Consultants:    Cardiology   Procedures:   2-D echo 04/04/16: EF 20-25 percent, diffuse hypokinesis.  Anti-Infectives:   Flagyl x 1 Rocephin 04/03/16---> Azithro 04/03/16--->   Subjective:   Stacey Lin says she is breathing much better today. No chest pain or tightness reported. Appetite remains diminished. Moved her bowels today.  Objective:    Filed Vitals:   04/06/16 0335 04/06/16 0339 04/06/16 0432 04/06/16 0811  BP: 116/82       Pulse: 73     Temp:  97.9 F (36.6  C)    TempSrc:  Oral    Resp: 11     Height:      Weight:   76.3 kg (168 lb 3.4 oz)   SpO2: 93%   100%    Intake/Output Summary (Last 24 hours) at 04/06/16 0821 Last data filed at 04/05/16 2233  Gross per 24 hour  Intake    700 ml  Output   1350 ml  Net   -650 ml   Filed Weights   04/04/16 1100 04/05/16 0500 04/06/16 0432  Weight: 78.5 kg (173 lb 1 oz) 75.4 kg (166 lb 3.6 oz) 76.3 kg (168 lb 3.4 oz)    Exam: General exam: Appears calm and comfortable.  Respiratory system: Crackles on right. Respiratory effort is slightly increased. Cardiovascular system: S1 & S2 heard, RRR. No rubs, gallops or clicks. No murmurs. +JVD. Gastrointestinal system: Abdomen is nondistended, soft and nontender. No organomegaly or masses felt. Normal bowel sounds heard. Central nervous system: Alert and oriented. No focal neurological deficits. Extremities: No clubbing, edema, or cyanosis. Skin: No rashes, lesions or ulcers Psychiatry: Judgement and insight appear normal. Mood & affect appropriate.   Data Reviewed:   I have personally reviewed following labs and imaging studies:  Labs: Basic Metabolic Panel:  Recent Labs Lab 04/03/16 0636 04/04/16 0430 04/05/16 0302 04/06/16 0459  NA 138 139 139 136  K 4.1 3.7 3.5 3.8  CL 106 104 101 97*  CO2 22 26 31 31   GLUCOSE 349* 136* 102* 107*  BUN 15 17 27* 21*  CREATININE 1.41* 1.15* 1.45* 1.25*  CALCIUM 9.0 8.9 9.4 9.3   GFR Estimated Creatinine Clearance: 48.6 mL/min (by C-G formula based on Cr of 1.25). Liver Function Tests:  Recent Labs Lab 04/03/16 0636  AST 34  ALT 30  ALKPHOS 115  BILITOT 0.7  PROT 7.0  ALBUMIN 3.7   CBC:  Recent Labs Lab 04/03/16 0636 04/03/16 1243 04/04/16 0430 04/05/16 0302 04/06/16 0459  WBC 19.6* 13.5* 15.4* 12.1* 10.6*  NEUTROABS 11.9* 12.9* 13.0*  --   --   HGB 13.7 12.5 11.4* 12.9 13.3  HCT 45.7 40.8 37.1 42.2 44.0  MCV 95.8 91.7 92.8 92.5 93.6  PLT  390 336 294 362 363   CBG:  Recent Labs Lab 04/05/16 1451 04/05/16 1748 04/05/16 1918 04/05/16 2305 04/06/16 0335  GLUCAP 163* 112* 211* 102* 133*   Hgb A1c:  Recent Labs  04/03/16 0904  HGBA1C 5.0   Sepsis Labs:  Recent Labs Lab 04/03/16 1134 04/03/16 1135 04/03/16 1243 04/03/16 1633 04/03/16 1847 04/04/16 0430 04/05/16 0302 04/06/16 0459  PROCALCITON  --  0.53  --   --   --   --  0.93  --   WBC  --   --  13.5*  --   --  15.4* 12.1* 10.6*  LATICACIDVEN 1.5  --  2.1* 1.4 1.7  --   --   --    Urine analysis:    Component Value Date/Time   COLORURINE YELLOW 04/03/2016 0725   APPEARANCEUR CLOUDY* 04/03/2016 0725   LABSPEC 1.013 04/03/2016 0725   PHURINE 6.5 04/03/2016 0725   GLUCOSEU 500* 04/03/2016 0725   HGBUR SMALL* 04/03/2016 0725   BILIRUBINUR NEGATIVE 04/03/2016 0725   KETONESUR NEGATIVE 04/03/2016 0725   PROTEINUR 100* 04/03/2016 0725   UROBILINOGEN 0.2 03/14/2014 0045   NITRITE NEGATIVE 04/03/2016 0725   LEUKOCYTESUR LARGE* 04/03/2016 0725   Microbiology Recent Results (from the past 240 hour(s))  Culture, blood (routine x 2)  Status: None (Preliminary result)   Collection Time: 04/03/16  7:10 AM  Result Value Ref Range Status   Specimen Description BLOOD RIGHT ANTECUBITAL  Final   Special Requests BOTTLES DRAWN AEROBIC AND ANAEROBIC  10CC  Final   Culture NO GROWTH 2 DAYS  Final   Report Status PENDING  Incomplete  Culture, blood (routine x 2)     Status: None (Preliminary result)   Collection Time: 04/03/16  7:15 AM  Result Value Ref Range Status   Specimen Description BLOOD RIGHT HAND  Final   Special Requests BOTTLES DRAWN AEROBIC AND ANAEROBIC  5CC  Final   Culture NO GROWTH 2 DAYS  Final   Report Status PENDING  Incomplete  Urine culture     Status: Abnormal   Collection Time: 04/03/16  7:25 AM  Result Value Ref Range Status   Specimen Description URINE, CLEAN CATCH  Final   Special Requests Normal  Final   Culture MULTIPLE  SPECIES PRESENT, SUGGEST RECOLLECTION (A)  Final   Report Status 04/04/2016 FINAL  Final  MRSA PCR Screening     Status: None   Collection Time: 04/03/16 12:07 PM  Result Value Ref Range Status   MRSA by PCR NEGATIVE NEGATIVE Final    Comment:        The GeneXpert MRSA Assay (FDA approved for NASAL specimens only), is one component of a comprehensive MRSA colonization surveillance program. It is not intended to diagnose MRSA infection nor to guide or monitor treatment for MRSA infections. Performed at [facility]     Radiology: No results found.  Medications:   . azithromycin  500 mg Oral Q24H  . cefTRIAXone (ROCEPHIN)  IV  1 g Intravenous Q24H  . digoxin  0.125 mg Oral Daily  . enoxaparin (LOVENOX) injection  40 mg Subcutaneous Q24H  . fluticasone furoate-vilanterol  1 puff Inhalation BID  . furosemide  40 mg Intravenous BID  . insulin aspart  0-9 Units Subcutaneous Q4H  . losartan  25 mg Oral Daily  . sodium chloride flush  10-40 mL Intracatheter Q12H  . sodium chloride flush  3 mL Intravenous Q12H  . spironolactone  25 mg Oral Daily   Continuous Infusions:   Time spent: 35 minutes.  The patient is medically complex with multiple co-morbidities and is at high risk for clinical deterioration and requires high complexity decision making and coordination of care with cardiology.    LOS: 3 days   RAMA,CHRISTINA  [facility] Pager [phone]. If unable to reach me by pager, please call my cell phone at [phone].  *Please refer to [email or web address], password [email or web address] to get updated schedule on who will round on this patient, as hospitalists switch teams weekly. If 7PM-7AM, please contact night-coverage at [email or web address], password [email or web address] for any overnight needs.  04/06/2016, 8:21 AM

## 2016-04-07 DIAGNOSIS — J13 Pneumonia due to Streptococcus pneumoniae: Secondary | ICD-10-CM | POA: Diagnosis present

## 2016-04-07 DIAGNOSIS — E119 Type 2 diabetes mellitus without complications: Secondary | ICD-10-CM

## 2016-04-07 LAB — GLUCOSE, CAPILLARY
GLUCOSE-CAPILLARY: 121 mg/dL — AB (ref 65–99)
Glucose-Capillary: 134 mg/dL — ABNORMAL HIGH (ref 65–99)
Glucose-Capillary: 136 mg/dL — ABNORMAL HIGH (ref 65–99)
Glucose-Capillary: 105 mg/dL — ABNORMAL HIGH (ref 65–99)
Glucose-Capillary: 126 mg/dL — ABNORMAL HIGH (ref 65–99)
Glucose-Capillary: 169 mg/dL — ABNORMAL HIGH (ref 65–99)
Glucose-Capillary: 130 mg/dL — ABNORMAL HIGH (ref 65–99)

## 2016-04-07 LAB — CARBOXYHEMOGLOBIN
Carboxyhemoglobin: 1.6 % — ABNORMAL HIGH (ref 0.5–1.5)
METHEMOGLOBIN: 1 % (ref 0.0–1.5)
O2 SAT: 63.3 %
TOTAL HEMOGLOBIN: 13.5 g/dL (ref 12.0–16.0)

## 2016-04-07 LAB — PROCALCITONIN: Procalcitonin: 0.17 ng/mL

## 2016-04-07 MED ORDER — CARVEDILOL 3.125 MG PO TABS
3.1250 mg | ORAL_TABLET | Freq: Two times a day (BID) | ORAL | Status: DC
  Administered 2016-04-07 – 2016-04-08 (×2): 3.125 mg via ORAL
  Filled 2016-04-07 (×2): qty 1

## 2016-04-07 MED ORDER — LOSARTAN POTASSIUM 25 MG PO TABS
25.0000 mg | ORAL_TABLET | Freq: Two times a day (BID) | ORAL | Status: DC
  Administered 2016-04-07 – 2016-04-08 (×2): 25 mg via ORAL
  Filled 2016-04-07 (×2): qty 1

## 2016-04-07 MED ORDER — FUROSEMIDE 40 MG PO TABS
40.0000 mg | ORAL_TABLET | Freq: Every day | ORAL | Status: DC
  Administered 2016-04-07 – 2016-04-08 (×2): 40 mg via ORAL
  Filled 2016-04-07 (×2): qty 1

## 2016-04-07 NOTE — Progress Notes (Signed)
Advanced Heart Failure Rounding Note   Subjective:    Admitted with volume overload in the setting of medication compliance.  EF back down to 20-25%  Feels much better. Wants to go home. CVP 8  Co-ox 63%  Creatinine 1.15>1.45 >1.2   Objective:   Weight Range:  Vital Signs:   Temp:  [97.7 F (36.5 C)-98.7 F (37.1 C)] 97.8 F (36.6 C) (06/15 1522) Pulse Rate:  [76-97] 92 (06/15 1522) Resp:  [14-22] 14 (06/15 1522) BP: (117-122)/(65-98) 117/84 mmHg (06/15 1522) SpO2:  [93 %-98 %] 95 % (06/15 1522) Weight:  [75.206 kg (165 lb 12.8 oz)-76.4 kg (168 lb 6.9 oz)] 75.206 kg (165 lb 12.8 oz) (06/15 0937) Last BM Date: 04/05/16  Weight change: Filed Weights   04/06/16 1411 04/07/16 0350 04/07/16 0937  Weight: 75.2 kg (165 lb 12.6 oz) 76.4 kg (168 lb 6.9 oz) 75.206 kg (165 lb 12.8 oz)    Intake/Output:   Intake/Output Summary (Last 24 hours) at 04/07/16 1725 Last data filed at 04/07/16 1300  Gross per 24 hour  Intake    580 ml  Output    650 ml  Net    -70 ml     Physical Exam: General:  Lying in bed NAD HEENT: normal Neck: supple. JVP 8 . Carotids 2+ bilat; no bruits. No lymphadenopathy or thryomegaly appreciated. Cor: PMI nondisplaced. Regular rate & rhythm. No rubs, gallops or murmurs. Lungs: clear  Abdomen: soft, nontender, nondistended. No hepatosplenomegaly. No bruits or masses. Good bowel sounds. Extremities: no cyanosis, clubbing, rash, edema.  Neuro: alert & orientedx3, cranial nerves grossly intact. moves all 4 extremities w/o difficulty. Affect pleasant  Telemetry: NSR 90s  Labs: Basic Metabolic Panel:  Recent Labs Lab 04/03/16 0636 04/04/16 0430 04/05/16 0302 04/06/16 0459  NA 138 139 139 136  K 4.1 3.7 3.5 3.8  CL 106 104 101 97*  CO2 22 26 31 31   GLUCOSE 349* 136* 102* 107*  BUN 15 17 27* 21*  CREATININE 1.41* 1.15* 1.45* 1.25*  CALCIUM 9.0 8.9 9.4 9.3    Liver Function Tests:  Recent Labs Lab 04/03/16 0636  AST 34  ALT 30    ALKPHOS 115  BILITOT 0.7  PROT 7.0  ALBUMIN 3.7   No results for input(s): LIPASE, AMYLASE in the last 168 hours. No results for input(s): AMMONIA in the last 168 hours.  CBC:  Recent Labs Lab 04/03/16 0636 04/03/16 1243 04/04/16 0430 04/05/16 0302 04/06/16 0459  WBC 19.6* 13.5* 15.4* 12.1* 10.6*  NEUTROABS 11.9* 12.9* 13.0*  --   --   HGB 13.7 12.5 11.4* 12.9 13.3  HCT 45.7 40.8 37.1 42.2 44.0  MCV 95.8 91.7 92.8 92.5 93.6  PLT 390 336 294 362 363    Cardiac Enzymes: No results for input(s): CKTOTAL, CKMB, CKMBINDEX, TROPONINI in the last 168 hours.  BNP: BNP (last 3 results)  Recent Labs  08/24/15 1115 01/12/16 1114 04/03/16 0636  BNP 238.7* 137.6* 775.0*    ProBNP (last 3 results) No results for input(s): PROBNP in the last 8760 hours.    Other results:  Imaging: No results found.   Medications:     Scheduled Medications: . cefTRIAXone (ROCEPHIN)  IV  1 g Intravenous Q24H  . digoxin  0.125 mg Oral Daily  . enoxaparin (LOVENOX) injection  40 mg Subcutaneous Q24H  . fluticasone furoate-vilanterol  1 puff Inhalation BID  . insulin aspart  0-9 Units Subcutaneous Q4H  . losartan  25 mg Oral Daily  .  sodium chloride flush  10-40 mL Intracatheter Q12H  . sodium chloride flush  3 mL Intravenous Q12H  . spironolactone  25 mg Oral Daily    Infusions:    PRN Medications: sodium chloride, acetaminophen, albuterol, hydrALAZINE, ondansetron (ZOFRAN) IV, oxyCODONE-acetaminophen, sodium chloride flush, sodium chloride flush   Assessment:   1. A/C Systolic Heart Failure  2.? RUL Pneumonia 3. HTN 4. DM 5. H/O Polysubstance Abuse 6.  Noncompliance   Plan/Discussion:    Admitted with volume overload in the setting of medication noncompliance. ECHO repeated with EF down to 20-25%. In November 2016 EF had recovered to 55%.   Volume status coming back up. Will start po lasix and increase losartan. (Can switch to [facility] as an outpatient). Start  low-dose carvedilol Continue digoxin 0.125 mg daily, and spiro 25 mg daily.   She is not a candidate for long intropes with history on substance abuse.   CXR ? RUL pneumonia. WBC trending down 19.6>10.6 .  On antibiotics per PCP.   Possibly home in am. Refer to HF paramedicine.   Can go to tel.   Length of Stay: 4   Bubber Rothert MD  04/07/2016, 5:25 PM  Advanced Heart Failure Team Pager [phone] (M-F; [place])  Please contact [facility] for night-coverage after hours (4p -7a ) and weekends on [email or web address]

## 2016-04-07 NOTE — Progress Notes (Signed)
Progress Note    Stacey Lin  EXB:[id number] DOB: 06/23/1966  DOA: 04/03/2016 PCP: Billee Cashing, MD    Brief Narrative:   Stacey Lin is an 50 y.o. female with medical history significant for chronic systolic and diastolic congestive heart failure followed by heart failure clinic, hypertension, mitral valve regurgitation, asthmatic bronchitis, history of cocaine abuse, tobacco use and noncompliance with medications. Pt had stopped all her meds when her bottles had run out because she felt well. For past few days prior to this admission patient reports worsening shortness of breath with occasional associated dry nonproductive cough but no fevers or chills. No lower extremity swelling.   On admission, blood pressure 94/56 and repeat 157/110, heart rate 62-114, RR 9-34. Oxygen saturation was 89% on room air but with nasal cannula oxygen support it has improved to 100%. Blood work was notable for leukocytosis of 19.6, mild elevation in creatinine 1.41 and lactic acid of 2.1. She was given one dose of Lasix 40 mg IV.  Chest x-ray showed enlargement of cardiac silhouette with slight pulmonary vascular congestion, persistent asymmetric right lung infiltrate questionable pneumonia versus asymmetric edema. She was started on empiric antibiotics. Of note her urinalysis showed large leukocytes with few bacteria and present Trichomonas for which reason she has received one dose of metronidazole.  Assessment/Plan:   Principal Problem:  Acute respiratory failure with hypoxia / acute on chronic combined systolic and diastolic congestive heart failure Initial hypoxia likely from acute decompensated CHF. Chest x-ray on admission with pulmonary vascular congestion versus pneumonia. Patient Has been successfully diuresed. Cardiology and the heart failure team continue to follow her. 2-D echocardiogram showed significant systolic dysfunction with an EF of 20-25 percent/diffuse hypokinesis. She is  currently now on spironolactone, Cozaar, hydralazine and digoxin. Lasix now discontinued. PICC line placed to monitor CVP and initiate inotropes if needed. I/O - 940 cc.  Active Problems:  Sepsis secondary to strep pneumoniae pneumonia / leukocytosis Sepsis criteria met on admission with tachycardia, tachypnea, hypoxia, leukocytosis and lactic acidosis with presumed source of infection: pneumonia. Chest x-ray shows pulmonary vascular congestion versus right lung infiltrate concerning for pneumonia. Patient started empirically on azithromycin and Rocephin, discontinue azithromycin given strep pneumonia + urinary antigen.  WBC improving. Blood cultures negative so far.   Trichomonas UTI Treated with single dose metronidazole.   Essential hypertension, benign Carvedilol on hold secondary to acute decompensated heart failure. Lasix now discontinued. Continue spironolactone, losartan, and hydralazine.   Acute renal failure superimposed on chronic kidney disease stage III Baseline creatinine about 2 months ago 1.02 and on this admission 1.41 likely secondary to sepsis versus diuretic therapy.   Hyperglycemia Likely stress related and due to sepsis. A1c is 5, which is not consistent with diabetes.  Family Communication/Anticipated D/C date and plan/Code Status   DVT prophylaxis: Lovenox ordered. Code Status: Full Code.  Family Communication: No family present at the bedside. Disposition Plan: Home when cleared for discharge by cardiology/heart failure team, likely 1-2 more days.   Medical Consultants:    Cardiology   Procedures:   2-D echo 04/04/16: EF 20-25 percent, diffuse hypokinesis.  Anti-Infectives:   Flagyl x 1 Rocephin 04/03/16---> Azithro 04/03/16--->   Subjective:   Stacey Lin is without complaints and wondering when she can go home. No dyspnea. No abdominal pain. No chest pain or tightness reported. Appetite remains diminished. Bowels moving.  Objective:     Filed Vitals:   04/07/16 1027 04/07/16 0340 04/07/16 0350 04/07/16 0819  BP: 118/83  122/98  Pulse: 79   76  Temp:  97.7 F (36.5 C)  97.8 F (36.6 C)  TempSrc:  Oral  Oral  Resp: 17   16  Height:      Weight:   76.4 kg (168 lb 6.9 oz)   SpO2: 98%   96%    Intake/Output Summary (Last 24 hours) at 04/07/16 0839 Last data filed at 04/07/16 0800  Gross per 24 hour  Intake    510 ml  Output   1450 ml  Net   -940 ml   Filed Weights   04/06/16 0432 04/06/16 1411 04/07/16 0350  Weight: 76.3 kg (168 lb 3.4 oz) 75.2 kg (165 lb 12.6 oz) 76.4 kg (168 lb 6.9 oz)    Exam: General exam: Appears calm and comfortable.  Respiratory system: Crackles on right. Respiratory effort is slightly increased. Cardiovascular system: S1 & S2 heard, RRR. No rubs, gallops or clicks. No murmurs. +JVD. Gastrointestinal system: Abdomen is nondistended, soft and nontender. No organomegaly or masses felt. Normal bowel sounds heard. Central nervous system: Alert and oriented. No focal neurological deficits. Extremities: No clubbing, edema, or cyanosis. Skin: No rashes, lesions or ulcers Psychiatry: Judgement and insight appear normal. Mood & affect appropriate.   Data Reviewed:   I have personally reviewed following labs and imaging studies:  Labs: Basic Metabolic Panel:  Recent Labs Lab 04/03/16 0636 04/04/16 0430 04/05/16 0302 04/06/16 0459  NA 138 139 139 136  K 4.1 3.7 3.5 3.8  CL 106 104 101 97*  CO2 22 26 31 31   GLUCOSE 349* 136* 102* 107*  BUN 15 17 27* 21*  CREATININE 1.41* 1.15* 1.45* 1.25*  CALCIUM 9.0 8.9 9.4 9.3   GFR Estimated Creatinine Clearance: 48.7 mL/min (by C-G formula based on Cr of 1.25). Liver Function Tests:  Recent Labs Lab 04/03/16 0636  AST 34  ALT 30  ALKPHOS 115  BILITOT 0.7  PROT 7.0  ALBUMIN 3.7   CBC:  Recent Labs Lab 04/03/16 0636 04/03/16 1243 04/04/16 0430 04/05/16 0302 04/06/16 0459  WBC 19.6* 13.5* 15.4* 12.1* 10.6*  NEUTROABS  11.9* 12.9* 13.0*  --   --   HGB 13.7 12.5 11.4* 12.9 13.3  HCT 45.7 40.8 37.1 42.2 44.0  MCV 95.8 91.7 92.8 92.5 93.6  PLT 390 336 294 362 363   CBG:  Recent Labs Lab 04/06/16 1609 04/06/16 2007 04/06/16 2350 04/07/16 0336 04/07/16 0820  GLUCAP 111* 176* 134* 136* 105*   Hgb A1c: No results for input(s): HGBA1C in the last 72 hours. Sepsis Labs:  Recent Labs Lab 04/03/16 1134 04/03/16 1135 04/03/16 1243 04/03/16 1633 04/03/16 1847 04/04/16 0430 04/05/16 0302 04/06/16 0459 04/07/16 0410  PROCALCITON  --  0.53  --   --   --   --  0.93  --  0.17  WBC  --   --  13.5*  --   --  15.4* 12.1* 10.6*  --   LATICACIDVEN 1.5  --  2.1* 1.4 1.7  --   --   --   --    Urine analysis:    Component Value Date/Time   COLORURINE YELLOW 04/03/2016 0725   APPEARANCEUR CLOUDY* 04/03/2016 0725   LABSPEC 1.013 04/03/2016 0725   PHURINE 6.5 04/03/2016 0725   GLUCOSEU 500* 04/03/2016 0725   HGBUR SMALL* 04/03/2016 0725   BILIRUBINUR NEGATIVE 04/03/2016 0725   KETONESUR NEGATIVE 04/03/2016 0725   PROTEINUR 100* 04/03/2016 0725   UROBILINOGEN 0.2 03/14/2014 0045   NITRITE NEGATIVE  04/03/2016 0725   LEUKOCYTESUR LARGE* 04/03/2016 0725   Microbiology Recent Results (from the past 240 hour(s))  Culture, blood (routine x 2)     Status: None (Preliminary result)   Collection Time: 04/03/16  7:10 AM  Result Value Ref Range Status   Specimen Description BLOOD RIGHT ANTECUBITAL  Final   Special Requests BOTTLES DRAWN AEROBIC AND ANAEROBIC  10CC  Final   Culture NO GROWTH 3 DAYS  Final   Report Status PENDING  Incomplete  Culture, blood (routine x 2)     Status: None (Preliminary result)   Collection Time: 04/03/16  7:15 AM  Result Value Ref Range Status   Specimen Description BLOOD RIGHT HAND  Final   Special Requests BOTTLES DRAWN AEROBIC AND ANAEROBIC  5CC  Final   Culture NO GROWTH 3 DAYS  Final   Report Status PENDING  Incomplete  Urine culture     Status: Abnormal   Collection  Time: 04/03/16  7:25 AM  Result Value Ref Range Status   Specimen Description URINE, CLEAN CATCH  Final   Special Requests Normal  Final   Culture MULTIPLE SPECIES PRESENT, SUGGEST RECOLLECTION (A)  Final   Report Status 04/04/2016 FINAL  Final  MRSA PCR Screening     Status: None   Collection Time: 04/03/16 12:07 PM  Result Value Ref Range Status   MRSA by PCR NEGATIVE NEGATIVE Final    Comment:        The GeneXpert MRSA Assay (FDA approved for NASAL specimens only), is one component of a comprehensive MRSA colonization surveillance program. It is not intended to diagnose MRSA infection nor to guide or monitor treatment for MRSA infections. Performed at [facility]     Radiology: No results found.  Medications:   . azithromycin  500 mg Oral Q24H  . cefTRIAXone (ROCEPHIN)  IV  1 g Intravenous Q24H  . digoxin  0.125 mg Oral Daily  . enoxaparin (LOVENOX) injection  40 mg Subcutaneous Q24H  . fluticasone furoate-vilanterol  1 puff Inhalation BID  . insulin aspart  0-9 Units Subcutaneous Q4H  . losartan  25 mg Oral Daily  . sodium chloride flush  10-40 mL Intracatheter Q12H  . sodium chloride flush  3 mL Intravenous Q12H  . spironolactone  25 mg Oral Daily   Continuous Infusions:   Time spent: 35 minutes.  The patient is medically complex with multiple co-morbidities and is at high risk for clinical deterioration and requires high complexity decision making and coordination of care with cardiology.    LOS: 4 days   RAMA,CHRISTINA  [facility] Pager [phone]. If unable to reach me by pager, please call my cell phone at [phone].  *Please refer to [email or web address], password [email or web address] to get updated schedule on who will round on this patient, as hospitalists switch teams weekly. If 7PM-7AM, please contact night-coverage at [email or web address], password [email or web address] for any overnight needs.  04/07/2016, 8:39 AM

## 2016-04-08 DIAGNOSIS — J13 Pneumonia due to Streptococcus pneumoniae: Secondary | ICD-10-CM

## 2016-04-08 LAB — BASIC METABOLIC PANEL
Anion gap: 6 (ref 5–15)
BUN: 18 mg/dL (ref 6–20)
CALCIUM: 9.4 mg/dL (ref 8.9–10.3)
CHLORIDE: 100 mmol/L — AB (ref 101–111)
CO2: 30 mmol/L (ref 22–32)
CREATININE: 1.16 mg/dL — AB (ref 0.44–1.00)
GFR calc Af Amer: >60 mL/min (ref >60)
GFR calc non Af Amer: 54 mL/min — ABNORMAL LOW (ref >60)
Glucose, Bld: 133 mg/dL — ABNORMAL HIGH (ref 65–99)
Potassium: 3.7 mmol/L (ref 3.5–5.1)
SODIUM: 136 mmol/L (ref 135–145)

## 2016-04-08 LAB — CULTURE, BLOOD (ROUTINE X 2)
Culture: NO GROWTH
Culture: NO GROWTH

## 2016-04-08 LAB — GLUCOSE, CAPILLARY
GLUCOSE-CAPILLARY: 89 mg/dL (ref 65–99)
Glucose-Capillary: 99 mg/dL (ref 65–99)

## 2016-04-08 MED ORDER — DIGOXIN 125 MCG PO TABS: 0.1250 mg | ORAL_TABLET | Freq: Every day | ORAL | Status: DC

## 2016-04-08 MED ORDER — SPIRONOLACTONE 25 MG PO TABS: 25.0000 mg | ORAL_TABLET | Freq: Every day | ORAL | Status: DC

## 2016-04-08 MED ORDER — CARVEDILOL 3.125 MG PO TABS: 3.1250 mg | ORAL_TABLET | Freq: Two times a day (BID) | ORAL | Status: DC

## 2016-04-08 MED ORDER — SACUBITRIL-VALSARTAN 24-26 MG PO TABS
1.0000 | ORAL_TABLET | Freq: Two times a day (BID) | ORAL | Status: DC
Start: 2016-04-08 — End: 2016-04-20

## 2016-04-08 MED ORDER — POTASSIUM CHLORIDE ER 10 MEQ PO TBCR: 10.0000 meq | EXTENDED_RELEASE_TABLET | Freq: Every day | ORAL | Status: DC

## 2016-04-08 MED ORDER — FUROSEMIDE 40 MG PO TABS: 40.0000 mg | ORAL_TABLET | Freq: Every day | ORAL | Status: DC

## 2016-04-08 NOTE — Progress Notes (Signed)
Advanced Heart Failure Rounding Note   Subjective:    Admitted with volume overload in the setting of medication compliance.  EF back down to 20-25%  Feeling good today.  States she will not stop her medicine any more now that she understands the importance a little better.  Says that she will also be compliant with follow up.  Had long talk with cardiac rehab about appropriate diet.   Creatinine 1.15>1.45 >1.2 > 1.16   Objective:   Weight Range:  Vital Signs:   Temp:  [97.3 F (36.3 C)-98.8 F (37.1 C)] 97.7 F (36.5 C) (06/16 0749) Pulse Rate:  [75-92] 91 (06/16 0934) Resp:  [11-18] 15 (06/16 0933) BP: (94-125)/(63-84) 117/75 mmHg (06/16 0933) SpO2:  [94 %-98 %] 96 % (06/16 0933) Weight:  [165 lb (74.844 kg)] 165 lb (74.844 kg) (06/16 0322) Last BM Date: 04/07/16  Weight change: Filed Weights   04/07/16 0350 04/07/16 0937 04/08/16 0322  Weight: 168 lb 6.9 oz (76.4 kg) 165 lb 12.8 oz (75.206 kg) 165 lb (74.844 kg)    Intake/Output:   Intake/Output Summary (Last 24 hours) at 04/08/16 1056 Last data filed at 04/08/16 0935  Gross per 24 hour  Intake    420 ml  Output   1025 ml  Net   -605 ml     Physical Exam: General:  Lying in bed NAD HEENT: normal Neck: supple. JVP 7-8. Carotids 2+ bilat; no bruits. No thyromegaly or nodule noted.  Cor: PMI nondisplaced. RRR. No rubs, gallops or murmurs. Lungs: CTAB, normal effort Abdomen: soft, NT, ND, no HSM. No bruits or masses. +BS  Extremities: no cyanosis, clubbing, rash, edema.  Neuro: alert & orientedx3, cranial nerves grossly intact. moves all 4 extremities w/o difficulty. Affect pleasant  Telemetry: NSR 90s  Labs: Basic Metabolic Panel:  Recent Labs Lab 04/03/16 0636 04/04/16 0430 04/05/16 0302 04/06/16 0459 04/08/16 0500  NA 138 139 139 136 136  K 4.1 3.7 3.5 3.8 3.7  CL 106 104 101 97* 100*  CO2 22 26 31 31 30   GLUCOSE 349* 136* 102* 107* 133*  BUN 15 17 27* 21* 18  CREATININE 1.41* 1.15* 1.45*  1.25* 1.16*  CALCIUM 9.0 8.9 9.4 9.3 9.4    Liver Function Tests:  Recent Labs Lab 04/03/16 0636  AST 34  ALT 30  ALKPHOS 115  BILITOT 0.7  PROT 7.0  ALBUMIN 3.7   No results for input(s): LIPASE, AMYLASE in the last 168 hours. No results for input(s): AMMONIA in the last 168 hours.  CBC:  Recent Labs Lab 04/03/16 0636 04/03/16 1243 04/04/16 0430 04/05/16 0302 04/06/16 0459  WBC 19.6* 13.5* 15.4* 12.1* 10.6*  NEUTROABS 11.9* 12.9* 13.0*  --   --   HGB 13.7 12.5 11.4* 12.9 13.3  HCT 45.7 40.8 37.1 42.2 44.0  MCV 95.8 91.7 92.8 92.5 93.6  PLT 390 336 294 362 363    Cardiac Enzymes: No results for input(s): CKTOTAL, CKMB, CKMBINDEX, TROPONINI in the last 168 hours.  BNP: BNP (last 3 results)  Recent Labs  08/24/15 1115 01/12/16 1114 04/03/16 0636  BNP 238.7* 137.6* 775.0*    ProBNP (last 3 results) No results for input(s): PROBNP in the last 8760 hours.    Other results:  Imaging: No results found.   Medications:     Scheduled Medications: . carvedilol  3.125 mg Oral BID WC  . cefTRIAXone (ROCEPHIN)  IV  1 g Intravenous Q24H  . digoxin  0.125 mg Oral Daily  .  enoxaparin (LOVENOX) injection  40 mg Subcutaneous Q24H  . fluticasone furoate-vilanterol  1 puff Inhalation BID  . furosemide  40 mg Oral Daily  . losartan  25 mg Oral BID  . sodium chloride flush  10-40 mL Intracatheter Q12H  . sodium chloride flush  3 mL Intravenous Q12H  . spironolactone  25 mg Oral Daily    Infusions:    PRN Medications: sodium chloride, acetaminophen, albuterol, ondansetron (ZOFRAN) IV, oxyCODONE-acetaminophen, sodium chloride flush, sodium chloride flush   Assessment:   1. A/C Systolic Heart Failure  2.? RUL Pneumonia 3. HTN 4. DM 5. H/O Polysubstance Abuse 6.  Noncompliance   Plan/Discussion:    Admitted with volume overload in the setting of medication noncompliance. ECHO repeated with EF down to 20-25%. In November 2016 EF had recovered to  55%.   Volume status OK on po diuretics. Instructed to take an extra lasix as needed for weight gain.   Continue lasix 40 mg and losartan 25 mg BID. (Can switch to [facility] as an outpatient). Continue carvedilol 3.125 mg BID.  Continue digoxin 0.125 mg daily and spiro 25 mg daily.   She is not a candidate for long intropes with history on substance abuse.   CXR ? RUL pneumonia. WBC trended down this admission 19.6>10.6 .  On antibiotics per PCP.   OK for home today.  Has follow up arranged in HF clinic 04/20/16  Stressed importance of medical compliance and maintaining her follow ups.  She requested 3 month refills, Told her she will need to be compliant with follow up first, as otherwise worried she would only follow up when out of meds.   Length of Stay: 5  Shirley Friar PA-C 04/08/2016, 10:56 AM  Advanced Heart Failure Team Pager [phone] (M-F; 7a - 4p)  Please contact [facility] for night-coverage after hours (4p -7a ) and weekends on [email or web address]  Patient seen and examined with Oda Kilts, PA-C. We discussed all aspects of the encounter. I agree with the assessment and plan as stated above.   She is stable for d/c. Agree with HF meds as above. Stressed need for compliance. Will follow in HF clinic.   Tyrihanna Wingert,MD 12:34 AM

## 2016-04-08 NOTE — Progress Notes (Signed)
Patient discharge instructions reviewed with patient and sister in law, Thelea Wyrick. Appointment information reviewed and the importance of adherence emphasized. Medications reviewed one by one, and explanation written on discharge packet and given orally. RN asked patient regarding medications (teach back) and patient able to recall information regarding medications. Importance of weighing herself everyday reviewed. Patient and sister in law verbalize understanding of all instructions.   PICC line removed by IV team. S/s of infection reviewed with patient. Dressing to stay in place for 24 hours, patient verbalizes understanding. Patient also counseled on discontinuing recreational drugs.

## 2016-04-08 NOTE — Discharge Instructions (Signed)
Heart Failure  Heart failure means your heart has trouble pumping blood. This makes it hard for your body to work well. Heart failure is usually a long-term (chronic) condition. You must take good care of yourself and follow your doctor's treatment plan.  HOME CARE   Take your heart medicine as told by your doctor.    Do not stop taking medicine unless your doctor tells you to.    Do not skip any dose of medicine.    Refill your medicines before they run out.    Take other medicines only as told by your doctor or pharmacist.   Stay active if told by your doctor. The elderly and people with severe heart failure should talk with a doctor about physical activity.   Eat heart-healthy foods. Choose foods that are without trans fat and are low in saturated fat, cholesterol, and salt (sodium). This includes fresh or frozen fruits and vegetables, fish, lean meats, fat-free or low-fat dairy foods, whole grains, and high-fiber foods. Lentils and dried peas and beans (legumes) are also good choices.   Limit salt if told by your doctor.   Cook in a healthy way. Roast, grill, broil, bake, poach, steam, or stir-fry foods.   Limit fluids as told by your doctor.   Weigh yourself every morning. Do this after you pee (urinate) and before you eat breakfast. Write down your weight to give to your doctor.   Take your blood pressure and write it down if your doctor tells you to.   Ask your doctor how to check your pulse. Check your pulse as told.   Lose weight if told by your doctor.   Stop smoking or chewing tobacco. Do not use gum or patches that help you quit without your doctor's approval.   Schedule and go to doctor visits as told.   Nonpregnant women should have no more than 1 drink a day. Men should have no more than 2 drinks a day. Talk to your doctor about drinking alcohol.   Stop illegal drug use.   Stay current with shots (immunizations).   Manage your health conditions as told by your doctor.   Learn to  manage your stress.   Rest when you are tired.   If it is really hot outside:    Avoid intense activities.    Use air conditioning or fans, or get in a cooler place.    Avoid caffeine and alcohol.    Wear loose-fitting, lightweight, and light-colored clothing.   If it is really cold outside:    Avoid intense activities.    Layer your clothing.    Wear mittens or gloves, a hat, and a scarf when going outside.    Avoid alcohol.   Learn about heart failure and get support as needed.   Get help to maintain or improve your quality of life and your ability to care for yourself as needed.  GET HELP IF:    You gain weight quickly.   You are more short of breath than usual.   You cannot do your normal activities.   You tire easily.   You cough more than normal, especially with activity.   You have any or more puffiness (swelling) in areas such as your hands, feet, ankles, or belly (abdomen).   You cannot sleep because it is hard to breathe.   You feel like your heart is beating fast (palpitations).   You get dizzy or light-headed when you stand up.  GET HELP   RIGHT AWAY IF:    You have trouble breathing.   There is a change in mental status, such as becoming less alert or not being able to focus.   You have chest pain or discomfort.   You faint.  MAKE SURE YOU:    Understand these instructions.   Will watch your condition.   Will get help right away if you are not doing well or get worse.     This information is not intended to replace advice given to you by your health care provider. Make sure you discuss any questions you have with your health care provider.     Document Released: 07/19/2008 Document Revised: 10/31/2014 Document Reviewed: 11/26/2012  [employer] Interactive Patient Education 2016 [employer]

## 2016-04-08 NOTE — Discharge Summary (Signed)
Physician Discharge Summary  Stacey Lin ZOX:[id number] DOB: 1965/12/25 DOA: 04/03/2016  PCP: Billee Cashing, MD  Admit date: 04/03/2016 Discharge date: 04/08/2016   Recommendations for Outpatient Follow-Up:   1. Patient will follow-up at the heart failure clinic. Recommend a check of her renal function at follow-up visit. 2. Follow up final blood culture results (- to date)   Discharge Diagnosis:   Principal Problem:    Acute respiratory failure with hypoxia and hypercarbia (HCC), Multifactorial, with Streptococcus pneumonia and acute on chronic combined heart failure contributory Active Problems:    Essential hypertension, benign    Mitral valve regurgitation    AKI (acute kidney injury) (HCC)    History of Chronic combined systolic and diastolic congestive heart failure (HCC)    Elevated lactic acid level    Leukocytosis    New onset type 2 diabetes mellitus (HCC)    Abnormal urinalysis    Trichomonas infection    Acute on chronic systolic (congestive) heart failure (HCC)    Streptococcus pneumoniae pneumonia [facility])   Discharge disposition:  Home.    Discharge Condition: Improved.  Diet recommendation: Low sodium, heart healthy.    History of Present Illness:   Stacey Lin is an 50 y.o. female with medical history significant for chronic systolic and diastolic congestive heart failure followed by heart failure clinic, hypertension, mitral valve regurgitation, asthmatic bronchitis, history of cocaine abuse, tobacco use and noncompliance with medications. Pt had stopped all her meds when her bottles had run out because she felt well. For past few days prior to this admission patient reports worsening shortness of breath with occasional associated dry nonproductive cough but no fevers or chills. No lower extremity swelling.   On admission, blood pressure 94/56 and repeat 157/110, heart rate 62-114, RR 9-34. Oxygen saturation was 89% on room air but with  nasal cannula oxygen support it has improved to 100%. Blood work was notable for leukocytosis of 19.6, mild elevation in creatinine 1.41 and lactic acid of 2.1. She was given one dose of Lasix 40 mg IV.  Chest x-ray showed enlargement of cardiac silhouette with slight pulmonary vascular congestion, persistent asymmetric right lung infiltrate questionable pneumonia versus asymmetric edema. She was started on empiric antibiotics. Of note her urinalysis showed large leukocytes with few bacteria and present Trichomonas for which reason she has received one dose of metronidazole.   Hospital Course by Problem:   Principal Problem:  Acute respiratory failure with hypoxia / acute on chronic combined systolic and diastolic congestive heart failure Initial hypoxia likely from acute decompensated CHF. Chest x-ray on admission with pulmonary vascular congestion versus pneumonia. Patient has been successfully diuresed. Cardiology and the heart failure team followed her during her hospital stay. 2-D echocardiogram showed significant systolic dysfunction with an EF of 20-25 percent/diffuse hypokinesis. She was discharged on spironolactone, Lasix, Coreg, digoxin and Entesto.   Active Problems:  Sepsis secondary to strep pneumoniae pneumonia / leukocytosis Sepsis criteria met on admission with tachycardia, tachypnea, hypoxia, leukocytosis and lactic acidosis with presumed source of infection: pneumonia. Chest x-ray shows pulmonary vascular congestion versus right lung infiltrate concerning for pneumonia. Patient initially treated with empiric azithromycin and Rocephin, azithromycin subsequently discontinued as pneumonia felt to be due to Streptococcus pneumoniae. She completed 5 days of IV antibiotic therapy.   Trichomonas UTI Treated with single dose metronidazole.   Essential hypertension, benign Continue medications as outlined above.   Acute renal failure superimposed on chronic kidney disease stage  III Baseline creatinine about 2 months ago 1.02  Physician Discharge Summary  Stacey Lin KPT:[id number] DOB: 06-25-1966 DOA: 04/03/2016  PCP: Ricke Hey, MD  Admit date: 04/03/2016 Discharge date: 04/08/2016   Recommendations for Outpatient Follow-Up:   1. Patient will follow-up at the heart failure clinic. Recommend a check of her renal function at follow-up visit. 2. Follow up final blood culture results (- to date)   Discharge Diagnosis:   Principal Problem:    Acute respiratory failure with hypoxia and hypercarbia ([place]), Multifactorial, with Streptococcus pneumonia and acute on chronic combined heart failure contributory Active Problems:    Essential hypertension, benign    Mitral valve regurgitation    AKI (acute kidney injury) ([place])    History of Chronic combined systolic and diastolic congestive heart failure (HCC)    Elevated lactic acid level    Leukocytosis    New onset type 2 diabetes mellitus (HCC)    Abnormal urinalysis    Trichomonas infection    Acute on chronic systolic (congestive) heart failure (HCC)    Streptococcus pneumoniae pneumonia [facility])   Discharge disposition:  Home.    Discharge Condition: Improved.  Diet recommendation: Low sodium, heart healthy.    History of Present Illness:   Stacey Lin is an 50 y.o. female with medical history significant for chronic systolic and diastolic congestive heart failure followed by heart failure clinic, hypertension, mitral valve regurgitation, asthmatic bronchitis, history of cocaine abuse, tobacco use and noncompliance with medications. Pt had stopped all her meds when her bottles had run out because she felt well. For past few days prior to this admission patient reports worsening shortness of breath with occasional associated dry nonproductive cough but no fevers or chills. No lower extremity swelling.   On admission, blood pressure 94/56 and repeat 157/110, heart rate 62-114, RR 9-34. Oxygen saturation was 89% on room air but with  nasal cannula oxygen support it has improved to 100%. Blood work was notable for leukocytosis of 19.6, mild elevation in creatinine 1.41 and lactic acid of 2.1. She was given one dose of Lasix 40 mg IV.  Chest x-ray showed enlargement of cardiac silhouette with slight pulmonary vascular congestion, persistent asymmetric right lung infiltrate questionable pneumonia versus asymmetric edema. She was started on empiric antibiotics. Of note her urinalysis showed large leukocytes with few bacteria and present Trichomonas for which reason she has received one dose of metronidazole.   Hospital Course by Problem:   Principal Problem:  Acute respiratory failure with hypoxia / acute on chronic combined systolic and diastolic congestive heart failure Initial hypoxia likely from acute decompensated CHF. Chest x-ray on admission with pulmonary vascular congestion versus pneumonia. Patient has been successfully diuresed. Cardiology and the heart failure team followed her during her hospital stay. 2-D echocardiogram showed significant systolic dysfunction with an EF of 20-25 percent/diffuse hypokinesis. She was discharged on spironolactone, Lasix, Coreg, digoxin and Entesto.   Active Problems:  Sepsis secondary to strep pneumoniae pneumonia / leukocytosis Sepsis criteria met on admission with tachycardia, tachypnea, hypoxia, leukocytosis and lactic acidosis with presumed source of infection: pneumonia. Chest x-ray shows pulmonary vascular congestion versus right lung infiltrate concerning for pneumonia. Patient initially treated with empiric azithromycin and Rocephin, azithromycin subsequently discontinued as pneumonia felt to be due to Streptococcus pneumoniae. She completed 5 days of IV antibiotic therapy.   Trichomonas UTI Treated with single dose metronidazole.   Essential hypertension, benign Continue medications as outlined above.   Acute renal failure superimposed on chronic kidney disease stage  III Baseline creatinine about 2 months ago 1.02  Physician Discharge Summary  Stacey Lin KPT:[id number] DOB: 06-25-1966 DOA: 04/03/2016  PCP: Ricke Hey, MD  Admit date: 04/03/2016 Discharge date: 04/08/2016   Recommendations for Outpatient Follow-Up:   1. Patient will follow-up at the heart failure clinic. Recommend a check of her renal function at follow-up visit. 2. Follow up final blood culture results (- to date)   Discharge Diagnosis:   Principal Problem:    Acute respiratory failure with hypoxia and hypercarbia ([place]), Multifactorial, with Streptococcus pneumonia and acute on chronic combined heart failure contributory Active Problems:    Essential hypertension, benign    Mitral valve regurgitation    AKI (acute kidney injury) ([place])    History of Chronic combined systolic and diastolic congestive heart failure (HCC)    Elevated lactic acid level    Leukocytosis    New onset type 2 diabetes mellitus (HCC)    Abnormal urinalysis    Trichomonas infection    Acute on chronic systolic (congestive) heart failure (HCC)    Streptococcus pneumoniae pneumonia [facility])   Discharge disposition:  Home.    Discharge Condition: Improved.  Diet recommendation: Low sodium, heart healthy.    History of Present Illness:   Stacey Lin is an 50 y.o. female with medical history significant for chronic systolic and diastolic congestive heart failure followed by heart failure clinic, hypertension, mitral valve regurgitation, asthmatic bronchitis, history of cocaine abuse, tobacco use and noncompliance with medications. Pt had stopped all her meds when her bottles had run out because she felt well. For past few days prior to this admission patient reports worsening shortness of breath with occasional associated dry nonproductive cough but no fevers or chills. No lower extremity swelling.   On admission, blood pressure 94/56 and repeat 157/110, heart rate 62-114, RR 9-34. Oxygen saturation was 89% on room air but with  nasal cannula oxygen support it has improved to 100%. Blood work was notable for leukocytosis of 19.6, mild elevation in creatinine 1.41 and lactic acid of 2.1. She was given one dose of Lasix 40 mg IV.  Chest x-ray showed enlargement of cardiac silhouette with slight pulmonary vascular congestion, persistent asymmetric right lung infiltrate questionable pneumonia versus asymmetric edema. She was started on empiric antibiotics. Of note her urinalysis showed large leukocytes with few bacteria and present Trichomonas for which reason she has received one dose of metronidazole.   Hospital Course by Problem:   Principal Problem:  Acute respiratory failure with hypoxia / acute on chronic combined systolic and diastolic congestive heart failure Initial hypoxia likely from acute decompensated CHF. Chest x-ray on admission with pulmonary vascular congestion versus pneumonia. Patient has been successfully diuresed. Cardiology and the heart failure team followed her during her hospital stay. 2-D echocardiogram showed significant systolic dysfunction with an EF of 20-25 percent/diffuse hypokinesis. She was discharged on spironolactone, Lasix, Coreg, digoxin and Entesto.   Active Problems:  Sepsis secondary to strep pneumoniae pneumonia / leukocytosis Sepsis criteria met on admission with tachycardia, tachypnea, hypoxia, leukocytosis and lactic acidosis with presumed source of infection: pneumonia. Chest x-ray shows pulmonary vascular congestion versus right lung infiltrate concerning for pneumonia. Patient initially treated with empiric azithromycin and Rocephin, azithromycin subsequently discontinued as pneumonia felt to be due to Streptococcus pneumoniae. She completed 5 days of IV antibiotic therapy.   Trichomonas UTI Treated with single dose metronidazole.   Essential hypertension, benign Continue medications as outlined above.   Acute renal failure superimposed on chronic kidney disease stage  III Baseline creatinine about 2 months ago 1.02  Physician Discharge Summary  Stacey Lin KPT:[id number] DOB: 06-25-1966 DOA: 04/03/2016  PCP: Ricke Hey, MD  Admit date: 04/03/2016 Discharge date: 04/08/2016   Recommendations for Outpatient Follow-Up:   1. Patient will follow-up at the heart failure clinic. Recommend a check of her renal function at follow-up visit. 2. Follow up final blood culture results (- to date)   Discharge Diagnosis:   Principal Problem:    Acute respiratory failure with hypoxia and hypercarbia ([place]), Multifactorial, with Streptococcus pneumonia and acute on chronic combined heart failure contributory Active Problems:    Essential hypertension, benign    Mitral valve regurgitation    AKI (acute kidney injury) ([place])    History of Chronic combined systolic and diastolic congestive heart failure (HCC)    Elevated lactic acid level    Leukocytosis    New onset type 2 diabetes mellitus (HCC)    Abnormal urinalysis    Trichomonas infection    Acute on chronic systolic (congestive) heart failure (HCC)    Streptococcus pneumoniae pneumonia [facility])   Discharge disposition:  Home.    Discharge Condition: Improved.  Diet recommendation: Low sodium, heart healthy.    History of Present Illness:   Stacey Lin is an 50 y.o. female with medical history significant for chronic systolic and diastolic congestive heart failure followed by heart failure clinic, hypertension, mitral valve regurgitation, asthmatic bronchitis, history of cocaine abuse, tobacco use and noncompliance with medications. Pt had stopped all her meds when her bottles had run out because she felt well. For past few days prior to this admission patient reports worsening shortness of breath with occasional associated dry nonproductive cough but no fevers or chills. No lower extremity swelling.   On admission, blood pressure 94/56 and repeat 157/110, heart rate 62-114, RR 9-34. Oxygen saturation was 89% on room air but with  nasal cannula oxygen support it has improved to 100%. Blood work was notable for leukocytosis of 19.6, mild elevation in creatinine 1.41 and lactic acid of 2.1. She was given one dose of Lasix 40 mg IV.  Chest x-ray showed enlargement of cardiac silhouette with slight pulmonary vascular congestion, persistent asymmetric right lung infiltrate questionable pneumonia versus asymmetric edema. She was started on empiric antibiotics. Of note her urinalysis showed large leukocytes with few bacteria and present Trichomonas for which reason she has received one dose of metronidazole.   Hospital Course by Problem:   Principal Problem:  Acute respiratory failure with hypoxia / acute on chronic combined systolic and diastolic congestive heart failure Initial hypoxia likely from acute decompensated CHF. Chest x-ray on admission with pulmonary vascular congestion versus pneumonia. Patient has been successfully diuresed. Cardiology and the heart failure team followed her during her hospital stay. 2-D echocardiogram showed significant systolic dysfunction with an EF of 20-25 percent/diffuse hypokinesis. She was discharged on spironolactone, Lasix, Coreg, digoxin and Entesto.   Active Problems:  Sepsis secondary to strep pneumoniae pneumonia / leukocytosis Sepsis criteria met on admission with tachycardia, tachypnea, hypoxia, leukocytosis and lactic acidosis with presumed source of infection: pneumonia. Chest x-ray shows pulmonary vascular congestion versus right lung infiltrate concerning for pneumonia. Patient initially treated with empiric azithromycin and Rocephin, azithromycin subsequently discontinued as pneumonia felt to be due to Streptococcus pneumoniae. She completed 5 days of IV antibiotic therapy.   Trichomonas UTI Treated with single dose metronidazole.   Essential hypertension, benign Continue medications as outlined above.   Acute renal failure superimposed on chronic kidney disease stage  III Baseline creatinine about 2 months ago 1.02

## 2016-04-08 NOTE — Care Management Important Message (Signed)
Important Message  Patient Details  Name: Stacey Lin MRN: [id number] Date of Birth: [birth year]   Medicare Important Message Given:  Yes    [name] Abena 04/08/2016, 12:12 PM

## 2016-04-08 NOTE — Progress Notes (Signed)
CARDIAC REHAB PHASE I   PRE:  Rate/Rhythm: 76 SR    BP: sitting 131/95    SaO2: 97 RA  MODE:  Ambulation: 300 ft   POST:  Rate/Rhythm: 106 ST    BP: sitting 130/94     SaO2: 94 RA  Pt weak and dizzy and SOB with walking at quick pace. Slowed down at end, rest x1. Reminded pt to go slow and rest when she needs to at home. She has stairs that she will need to climb, encouraged her to do 3-4 then rest and not try to carry things up. Reviewed low sodium (many questions, she eats out mostly, discussed what to choose), daily wts, walking. Expect fair compliance. Sts she is done smoking, we discussed strategies somewhat. She is easily distracted at times. Reminders given for no drug use. She sts she is going to do better in general.  IG:[id number]   [place], ACSM 04/08/2016 11:31 AM

## 2016-04-12 ENCOUNTER — Telehealth (HOSPITAL_COMMUNITY): Payer: Self-pay | Admitting: Surgery

## 2016-04-12 NOTE — Telephone Encounter (Signed)
Heart Failure Nurse Navigator Post Discharge Telephone Call  I called Stacey Lin to check on her after her recent hospitalization.  She tells me that she is doing "very well--breathing so much better".  She says she is weighing daily and weight today is 162 lbs versus discharge weight of 165 lbs.  She also says that she is "trying hard" to eat low sodium.   I reminded her of her appt at the [facility] on Wed June 28th-- and she says that she "will be there".  I encouraged her to call me back or to the clinic with any concerns or questions regarding her HF.

## 2016-04-14 ENCOUNTER — Encounter: Payer: Self-pay | Admitting: Internal Medicine

## 2016-04-14 ENCOUNTER — Ambulatory Visit (INDEPENDENT_AMBULATORY_CARE_PROVIDER_SITE_OTHER): Payer: Medicare Other | Admitting: Internal Medicine

## 2016-04-14 VITALS — BP 94/60 | HR 73 | Ht 60.0 in | Wt 165.8 lb

## 2016-04-14 DIAGNOSIS — J4530 Mild persistent asthma, uncomplicated: Secondary | ICD-10-CM | POA: Diagnosis not present

## 2016-04-14 DIAGNOSIS — R0609 Other forms of dyspnea: Secondary | ICD-10-CM | POA: Insufficient documentation

## 2016-04-14 DIAGNOSIS — R0600 Dyspnea, unspecified: Secondary | ICD-10-CM | POA: Diagnosis not present

## 2016-04-14 MED ORDER — BUDESONIDE-FORMOTEROL FUMARATE 80-4.5 MCG/ACT IN AERO: INHALATION_SPRAY | RESPIRATORY_TRACT | Status: DC

## 2016-04-14 NOTE — Patient Instructions (Signed)
If short of breath > try  symbicort 80 up to 2 pffs every 12 hours as needed  The most important aspect of your care from our perspective from this point forward is avoiding all smoking and smoke exposure   If you are satisfied with your treatment plan,  let your doctor know and he/she can either refill your medications or you can return here when your prescription runs out.     If in any way you are not 100% satisfied,  please tell us.  If 100% better, tell your friends!  Pulmonary follow up is as needed

## 2016-04-14 NOTE — Progress Notes (Signed)
Subjective:    Patient ID: Stacey Lin, female    DOB: 1965-11-19   MRN: [id number]   Brief patient profile:  50yobf smoker previously seen by Dr. Shelle Iron 12/18/14 with dyspnea on exertion.She was felt to have asthmatic bronchitis but not COPD  rec Stop spiriva rx breo 100, take one inhalation each am proair respiclick, 2 inhalations every 6 hrs only if you are having severe breathing issues. Work on weight loss and conditioning. Will send a note to your cardiologist to see about getting you off lisinopril to see if this helps your dry tickling cough. You must stop smoking 100% in order to stay well.  followup with me again in 2mos to check on things> did not return.   History of Present Illness  02/12/2016: NP Follow up Office Visit: Patient presents to the office today with worsening dyspnea on exertion. She was seen at the heart failure clinic one day prior to OV   and they requested that she follow up with pulmonary. Spirometry 11/2014 showed  normal FEV1 with ? air trapping given her   FVC ? 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 She is continuing to smoke approx. 2 cigarettes daily per her history.    she has not been using her Breo inhaler daily as  Maintenance medication, but just when needed.   rec We will give you a Breo sample and new prescription for your Breo.( Maintenance inhaler) Use the Breo 1 puff twice daily. We will give you a prescription for your Pro Air Inhaler ( Rescue Inhaler) Use this every 6 hours as needed for SOB or Wheezing. Claritin ( loratadine) 1 every day for allergies Nasal Saline as needed for nasal congestion Continue weighing yourself daily per the heart failure clinic Follow up in 1 month > did  Not return   Admit date: 04/03/2016 Discharge date: 04/08/2016     Discharge Diagnosis:   Principal Problem:   Acute respiratory failure with hypoxia and hypercarbia (HCC), Multifactorial, with Streptococcus pneumonia and acute on chronic  combined heart failure contributory Active Problems:   Essential hypertension, benign   Mitral valve regurgitation   AKI (acute kidney injury) (HCC)   History of Chronic combined systolic and diastolic congestive heart failure (HCC)   Elevated lactic acid level   Leukocytosis   New onset type 2 diabetes mellitus (HCC)   Abnormal urinalysis   Trichomonas infection   Acute on chronic systolic (congestive) heart failure (HCC)   Streptococcus pneumoniae pneumonia (HCC) - Pos Strep Ag 04/05/16    Discharge disposition: Home.   Discharge Condition: Improved.  Diet recommendation: Low sodium, heart healthy.   History of Present Illness:   Stacey Lin is an 50 y.o. female with medical history significant for chronic systolic and diastolic congestive heart failure followed by heart failure clinic, hypertension, mitral valve regurgitation, asthmatic bronchitis, history of cocaine abuse, tobacco use and noncompliance with medications. Pt had stopped all her meds when her bottles had run out because she felt well. For past few days prior to this admission patient reports worsening shortness of breath with occasional associated dry nonproductive cough but no fevers or chills. No lower extremity swelling.   On admission, blood pressure 94/56 and repeat 157/110, heart rate 62-114, RR 9-34. Oxygen saturation was 89% on room air but with nasal cannula oxygen support it has improved to 100%. Blood work was notable for leukocytosis of 19.6, mild elevation in creatinine 1.41 and lactic acid of 2.1. She was given one dose of  Lasix 40 mg IV.  Chest x-ray showed enlargement of cardiac silhouette with slight pulmonary vascular congestion, persistent asymmetric right lung infiltrate questionable pneumonia versus asymmetric edema. She was started on empiric antibiotics. Of note her urinalysis showed large leukocytes with few bacteria and present Trichomonas for which reason she has  received one dose of metronidazole.   Hospital Course by Problem:   Principal Problem:  Acute respiratory failure with hypoxia / acute on chronic combined systolic and diastolic congestive heart failure Initial hypoxia likely from acute decompensated CHF. Chest x-ray on admission with pulmonary vascular congestion versus pneumonia. Patient has been successfully diuresed. Cardiology and the heart failure team followed her during her hospital stay. 2-D echocardiogram showed significant systolic dysfunction with an EF of 20-25 percent/diffuse hypokinesis. She was discharged on spironolactone, Lasix, Coreg, digoxin and Entesto.   Active Problems:  Sepsis secondary to strep pneumoniae pneumonia / leukocytosis Sepsis criteria met on admission with tachycardia, tachypnea, hypoxia, leukocytosis and lactic acidosis with presumed source of infection: pneumonia. Chest x-ray shows pulmonary vascular congestion versus right lung infiltrate concerning for pneumonia. Patient initially treated with empiric azithromycin and Rocephin, azithromycin subsequently discontinued as pneumonia felt to be due to Streptococcus pneumoniae. She completed 5 days of IV antibiotic therapy.       04/14/2016  f/u ov/Kaliegh Willadsen re: transition of care / has breo not using it  Chief Complaint  Patient presents with  . Follow-up    Pt states her breathing is unchanged. She gets SOB sometimes just talking. She also c/o cough- non prod.   ok first thing in am, ok sitting still unless using voice then sometimes sob at rest  Mailbox to house is flat and has sit down on landing before attempting the steps  Quit smoking one week prior to OV No 02 Admits she hasn't been using  any of her meds including the cardiac meds as rec    No obvious day to day or daytime variability in doe  or assoc excess/ purulent sputum or mucus plugs or hemoptysis or cp or chest tightness, subjective wheeze or overt sinus or hb symptoms. No unusual exp hx or  h/o childhood pna/ asthma or knowledge of premature birth.  Sleeping ok without nocturnal  or early am exacerbation  of respiratory  c/o's or need for noct saba. Also denies any obvious fluctuation of symptoms with weather or environmental changes or other aggravating or alleviating factors except as outlined above   Current Medications, Allergies, Complete Past Medical History, Past Surgical History, Family History, and Social History were reviewed in [employer] record.  ROS  The following are not active complaints unless bolded sore throat, dysphagia, dental problems, itching, sneezing,  nasal congestion or excess/ purulent secretions, ear ache,   fever, chills, sweats, unintended wt loss, classically pleuritic or exertional cp,  orthopnea pnd or leg swelling, presyncope, palpitations, abdominal pain, anorexia, nausea, vomiting, diarrhea  or change in bowel or bladder habits, change in stools or urine, dysuria,hematuria,  rash, arthralgias, visual complaints, headache, numbness, weakness or ataxia or problems with walking or coordination,  change in mood/affect or memory.           Objective:   Physical Exam   Physical Exam:  Anxious amb bf nad/ very evasive with questions related to med use   General- No distress,  A&Ox3 ENT: No sinus tenderness, TM clear, pale nasal mucosa, no oral exudate,no post nasal drip, no LAN Cardiac: S1, S2, regular rate and rhythm, no murmur Chest:  No wheeze/ rales/ dullness; no accessory muscle use, no nasal flaring, no sternal retractions Abd.: Soft Non-tender Ext: No clubbing cyanosis, edema Neuro:  normal strength Skin: No rashes, warm and dry Psych: normal mood and behavior    CXR PA and Lateral:   04/14/2016 :    I personally reviewed images and agree with radiology impression as follows:    Enlargement of cardiac silhouette with slight pulmonary vascular congestion.  Persistent asymmetric RIGHT lung infiltrate question  pneumonia versus asymmetric edema.     Assessment & Plan:

## 2016-04-18 ENCOUNTER — Encounter: Payer: Self-pay | Admitting: Internal Medicine

## 2016-04-18 NOTE — Assessment & Plan Note (Signed)
Multifactorial but not presently limited by pulmonary status/ f/u here is prn

## 2016-04-18 NOTE — Assessment & Plan Note (Signed)
PFTs 10/2013:  FEV1 0.86 (41%), ratio 67, TLC 58%, unable to do DLCO Arlyce Harman as part of CPST 02/2014:  FEV1 1.19 (59%), ratio 72 Spiro 11/2014:  FEV1 1.08 (54%), ratio 80 - Spirometry 04/14/2016  FEV1 1.32 (68%)  Ratio 79 with nl effort indep portion of f/v loop    So she does not have copd at present    I reviewed the Fletcher curve with the patient that basically indicates  if you quit smoking when your best day FEV1 is still well preserved (as is clearly  the case here)  it is highly unlikely you will progress to severe disease and informed the patient there was no medication on the market that has proven to alter the curve/ its downward trajectory  or the likelihood of progression of their disease.  Therefore stopping smoking and maintaining abstinence is the most important aspect of care, not choice of inhalers or for that matter, doctors.    rx with symbicort can be prn   04/14/2016  After extensive coaching HFA effectiveness =    75%  I had an extended summary discussion with the patient reviewing all relevant studies completed to date and  lasting 15 to 20 minutes of a 25 minute visit    Each maintenance medication was reviewed in detail including most importantly the difference between maintenance and prns and under what circumstances the prns are to be triggered using an action plan format that is not reflected in the computer generated alphabetically organized AVS.    Please see instructions for details which were reviewed in writing and the patient given a copy highlighting the part that I personally wrote and discussed at today's ov.

## 2016-04-20 ENCOUNTER — Encounter (HOSPITAL_COMMUNITY): Payer: Self-pay | Admitting: Internal Medicine

## 2016-04-20 ENCOUNTER — Ambulatory Visit (HOSPITAL_COMMUNITY)
Admit: 2016-04-20 | Discharge: 2016-04-20 | Disposition: A | Discharge status: Home / Self Care | Payer: Medicare Other | Attending: Internal Medicine | Admitting: Internal Medicine

## 2016-04-20 VITALS — BP 120/74 | HR 77 | Ht 60.0 in | Wt 165.0 lb

## 2016-04-20 DIAGNOSIS — D649 Anemia, unspecified: Secondary | ICD-10-CM | POA: Insufficient documentation

## 2016-04-20 DIAGNOSIS — E7800 Pure hypercholesterolemia, unspecified: Secondary | ICD-10-CM | POA: Diagnosis not present

## 2016-04-20 DIAGNOSIS — J449 Chronic obstructive pulmonary disease, unspecified: Secondary | ICD-10-CM | POA: Diagnosis not present

## 2016-04-20 DIAGNOSIS — Z9119 Patient's noncompliance with other medical treatment and regimen: Secondary | ICD-10-CM | POA: Diagnosis not present

## 2016-04-20 DIAGNOSIS — F1910 Other psychoactive substance abuse, uncomplicated: Secondary | ICD-10-CM | POA: Insufficient documentation

## 2016-04-20 DIAGNOSIS — I110 Hypertensive heart disease with heart failure: Secondary | ICD-10-CM | POA: Insufficient documentation

## 2016-04-20 DIAGNOSIS — R51 Headache: Secondary | ICD-10-CM | POA: Insufficient documentation

## 2016-04-20 DIAGNOSIS — I5042 Chronic combined systolic (congestive) and diastolic (congestive) heart failure: Secondary | ICD-10-CM | POA: Diagnosis not present

## 2016-04-20 DIAGNOSIS — F17210 Nicotine dependence, cigarettes, uncomplicated: Secondary | ICD-10-CM | POA: Diagnosis not present

## 2016-04-20 DIAGNOSIS — R0600 Dyspnea, unspecified: Secondary | ICD-10-CM | POA: Insufficient documentation

## 2016-04-20 DIAGNOSIS — I499 Cardiac arrhythmia, unspecified: Secondary | ICD-10-CM | POA: Diagnosis not present

## 2016-04-20 LAB — BASIC METABOLIC PANEL
Anion gap: 10 (ref 5–15)
BUN: 13 mg/dL (ref 6–20)
CALCIUM: 9.8 mg/dL (ref 8.9–10.3)
CO2: 24 mmol/L (ref 22–32)
CREATININE: 1.08 mg/dL — AB (ref 0.44–1.00)
Chloride: 106 mmol/L (ref 101–111)
GFR calc non Af Amer: 58 mL/min — ABNORMAL LOW (ref >60)
Glucose, Bld: 114 mg/dL — ABNORMAL HIGH (ref 65–99)
Potassium: 4.1 mmol/L (ref 3.5–5.1)
SODIUM: 140 mmol/L (ref 135–145)

## 2016-04-20 LAB — DIGOXIN LEVEL: Digoxin Level: 0.6 ng/mL — ABNORMAL LOW (ref 0.8–2.0)

## 2016-04-20 LAB — BRAIN NATRIURETIC PEPTIDE: B NATRIURETIC PEPTIDE 5: 40 pg/mL (ref 0.0–100.0)

## 2016-04-20 MED ORDER — FUROSEMIDE 40 MG PO TABS: 40.0000 mg | ORAL_TABLET | Freq: Every day | ORAL | Status: DC

## 2016-04-20 MED ORDER — SACUBITRIL-VALSARTAN 49-51 MG PO TABS: 1.0000 | ORAL_TABLET | Freq: Two times a day (BID) | ORAL | Status: DC

## 2016-04-20 NOTE — Progress Notes (Signed)
Patient ID: Stacey Lin, female   DOB: Mar 07, 1966, 50 y.o.   MRN: [id number]    Advanced Heart Failure Clinic Note   PCP: [facility] Pulmonologist: Dr. Shelle Iron  HPI: Stacey Lin is a 50 yo female with a history of anemia, combined systolic/diastolic heart failure, HTN, and history of polysubstance abuse. Initial ECHO (04/2010) EF 50-55% with grd 1 DD, LVIDd 47mm, mild to mod MR, RA and LA mildly dilated.   Admitted to the hospital 08/19/13-08/29/13 with A/C systolic HF. Discharge weight 144 lbs.  She presents today for post-hospital f/u. Admitted 6/17 withr ecurrent HF after no taking meds for several months. Echo with EF 20-25%. Diuresed and discharged home with paramedicine following. D/c weight 165  Says she feels better. Denies SOB or edema.   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 Mod MR and Mod-sev TR Echo (1/15): EF 45%, mild to moderate AI, moderate MR, RV normal size with mildly decreased systolic function.  CPX (5/15): FVC 66%, FEV1 59%, ratio 72%, peak VO2 15.7, slope 22.6 => mildly decreased functional capacity limited primarily by lungs.  Echo (05/2014): EF 55-60%, mild/mod AI, grade I DD and mild MR R/LHC (06/06/14): ectatic coronaries (suspect d/t HTN) no significant arthrosclerosis and normal EF. No significant MR noted Echo (7/16): EF 60-65%, moderate AI.  Echo (11/16): EF 55%, Mild LAE Echo 6/17: EF 20-25% severe central MR  Labs (1/15): K 4.1, creatinine 0.96, hemoglobin 10.1 Labs (12/04/13) K 4.3 Creatinine 0.97 Labs (4/15) K+4.1, Creatinine 0.88 Labs (6/15) Hgb 10.9, K 3.8, creatinine 1.19 Labs (2/16) HCT 35.1, TSH normal Labs (5/16) K 3.3, creatinine 1.38 Labs (9/16): K 3.9, creatinine 1.24, HCT 38.3 Labs (10/16): K 3.9 Creatinine 0.98 TSH  1.846  Labs (11/16): K 3.8 , Creatinine 1.49  SH: Living in [place] with boyfriend. No ETOH or drug use. 2-3 cigs/day.   FH: Mother deceased: Dementia, seizures, stomach cancer        Father deceased: HTN  ROS: All systems negative except as listed in HPI, PMH and Problem List.  Past Medical History  Diagnosis Date  . Hypertension   . Anemia   . Arrhythmia   . Hypercholesteremia   . Chronic headaches   . Asthma   . Anginal pain (HCC)     2 months  . CHF (congestive heart failure) (HCC)     1990s  . COPD (chronic obstructive pulmonary disease) (HCC)     ??    Current Outpatient Prescriptions  Medication Sig Dispense Refill  . acetaminophen (TYLENOL) 500 MG tablet Take 1,500 mg by mouth every 6 (six) hours as needed for headache (pain).     . budesonide-formoterol (SYMBICORT) 80-4.5 MCG/ACT inhaler Take 2 puffs first thing in am and then another 2 puffs about 12 hours later. 1 Inhaler 11  . carvedilol (COREG) 3.125 MG tablet Take 1 tablet (3.125 mg total) by mouth 2 (two) times daily with a meal. 60 tablet 3  . digoxin (LANOXIN) 0.125 MG tablet Take 1 tablet (0.125 mg total) by mouth daily. 30 tablet 3  . furosemide (LASIX) 40 MG tablet Take 1 tablet (40 mg total) by mouth daily. 30 tablet 3  . HYDROcodone-acetaminophen (NORCO) 7.5-325 MG tablet Take 1 tablet by mouth every 6 (six) hours as needed for moderate pain.    Marland Kitchen  potassium chloride (K-DUR) 10 MEQ tablet Take 1 tablet (10 mEq total) by mouth daily. 30 tablet 3  . sacubitril-valsartan (ENTRESTO) 24-26 MG Take 1 tablet by mouth 2 (two) times daily. 60 tablet 3  . spironolactone (ALDACTONE) 25 MG tablet Take 1 tablet (25 mg total) by mouth daily. 30 tablet 30   No current facility-administered medications for this encounter.   Filed Vitals:   04/20/16 1458  BP: 120/74  Pulse: 77  Height: 5' (1.524 m)  Weight: 165 lb (74.844 kg)  SpO2: 97%   Wt Readings from Last 3 Encounters:  04/20/16 165 lb (74.844 kg)  04/14/16 165  lb 12.8 oz (75.206 kg)  04/08/16 165 lb (74.844 kg)     PHYSICAL EXAM: General:  NAD, Slightly increased WOB with long sentences. HEENT: normal Neck: supple. JVP 5-6  Carotids 2+ bilaterally; no bruits. No thyromegaly or nodule noted. Cor: PMI normal. RRR. No rubs, gallops. 1/6 SEM RUSB (no diastolic murmur appreciated).  Lungs: Lung sounds diminished, scant wheezes.  Abdomen: soft, non-tender, non-distended, no HSM. No bruits or masses. +BS  Extremities: no cyanosis, clubbing, rash. No edema.  Neuro: alert & orientedx3, cranial nerves grossly intact. Moves all 4 extremities w/o difficulty. Affect pleasant.  ASSESSMENT & PLAN:  1) Acute on chronic systolic  HF:  Nonischemic cardiomyopathy, on echo in 2016 LV function remained back to normal => EF 55%. Now with recurrent systolic dysfunction EF 20-25% (6/17) after stopping med  - Volume status looks good   - Continue coreg 3.125 mg BID, digoxin 0.125 daily, spiro 25 - Increase Entresto to 49/51 - Check BMET today and repeat BMET x 2 weeks. 2) Dyspnea: CPX in 5/15 suggested primarily a pulmonary limitation.  She follows with Dr. Sherene Sires in Pulmonary 3) HTN: well controleld 4) Nicotine abuse:  - Continues to smoke 2-5 cigarettes a day.   Encouraged to stop.  5) Medical Non-compliance - Encouraged her to remain on HF meds.   BMET today. Repeat BMET 2 weeks at follow up. Med changes as above.   Arvilla Meres MD 04/20/2016

## 2016-04-20 NOTE — Patient Instructions (Signed)
INCREASE Entresto to 49/51 mg tablet twice daily.  Continue Lasix 40 mg onc edaily. If weight 165 lbs or greater take 1 extra tablet of lasix.  Routine lab work today. Will notify you of abnormal results, otherwise no news is good news!  Follow up with CHF Clinical Pharmacist Erika in 3 weeks.  Follow up with Dr. Haroldine Laws in 2 months.  Do the following things EVERYDAY: 1) Weigh yourself in the morning before breakfast. Write it down and keep it in a log. 2) Take your medicines as prescribed 3) Eat low salt foods-Limit salt (sodium) to 2000 mg per day.  4) Stay as active as you can everyday 5) Limit all fluids for the day to less than 2 liters

## 2016-05-11 ENCOUNTER — Ambulatory Visit (HOSPITAL_COMMUNITY): Payer: Medicare Other

## 2016-05-11 ENCOUNTER — Telehealth (HOSPITAL_COMMUNITY): Payer: Self-pay | Admitting: Pharmacist

## 2016-05-11 NOTE — Telephone Encounter (Signed)
Entresto 49-51 mg PO BID PA approved by [employer] Part D through 05/10/18.   Ruta Hinds. Velva Harman, PharmD, BCPS, CPP Clinical Pharmacist Pager: [phone] Phone: [phone] 05/11/2016 10:36 AM

## 2016-05-16 ENCOUNTER — Ambulatory Visit (HOSPITAL_COMMUNITY)
Admission: RE | Admit: 2016-05-16 | Discharge: 2016-05-16 | Disposition: A | Discharge status: Home / Self Care | Payer: Medicare Other | Source: Ambulatory Visit | Attending: Internal Medicine | Admitting: Internal Medicine

## 2016-05-16 VITALS — BP 108/78 | HR 70 | Wt 166.6 lb

## 2016-05-16 DIAGNOSIS — Z9119 Patient's noncompliance with other medical treatment and regimen: Secondary | ICD-10-CM | POA: Insufficient documentation

## 2016-05-16 DIAGNOSIS — D649 Anemia, unspecified: Secondary | ICD-10-CM | POA: Insufficient documentation

## 2016-05-16 DIAGNOSIS — Z79899 Other long term (current) drug therapy: Secondary | ICD-10-CM | POA: Diagnosis not present

## 2016-05-16 DIAGNOSIS — F1910 Other psychoactive substance abuse, uncomplicated: Secondary | ICD-10-CM | POA: Insufficient documentation

## 2016-05-16 DIAGNOSIS — F17200 Nicotine dependence, unspecified, uncomplicated: Secondary | ICD-10-CM | POA: Insufficient documentation

## 2016-05-16 DIAGNOSIS — I110 Hypertensive heart disease with heart failure: Secondary | ICD-10-CM | POA: Diagnosis not present

## 2016-05-16 DIAGNOSIS — R0601 Orthopnea: Secondary | ICD-10-CM | POA: Insufficient documentation

## 2016-05-16 DIAGNOSIS — I5042 Chronic combined systolic (congestive) and diastolic (congestive) heart failure: Secondary | ICD-10-CM | POA: Diagnosis present

## 2016-05-16 LAB — BASIC METABOLIC PANEL
ANION GAP: 9 (ref 5–15)
BUN: 18 mg/dL (ref 6–20)
CALCIUM: 9.8 mg/dL (ref 8.9–10.3)
CHLORIDE: 99 mmol/L — AB (ref 101–111)
CO2: 30 mmol/L (ref 22–32)
Creatinine, Ser: 1.39 mg/dL — ABNORMAL HIGH (ref 0.44–1.00)
GFR calc non Af Amer: 43 mL/min — ABNORMAL LOW (ref >60)
GFR, EST AFRICAN AMERICAN: 50 mL/min — AB (ref >60)
Glucose, Bld: 135 mg/dL — ABNORMAL HIGH (ref 65–99)
Potassium: 3.3 mmol/L — ABNORMAL LOW (ref 3.5–5.1)
Sodium: 138 mmol/L (ref 135–145)

## 2016-05-16 MED ORDER — CARVEDILOL 6.25 MG PO TABS: 6.2500 mg | ORAL_TABLET | Freq: Two times a day (BID) | ORAL | 3 refills | Status: DC

## 2016-05-16 NOTE — Patient Instructions (Signed)
Please INCREASE your CARVEDILOL to 6.25 mg TWICE DAILY. You may take your current carvedilol 3.125 mg tablets but take 2 tablets twice daily until you get the new prescription.    Labs today. Will call with any abnormalities.   Please keep your appointment with Dr. Haroldine Laws on 8/28.

## 2016-05-16 NOTE — Progress Notes (Signed)
HPI:  Stacey Lin is a 50 yo AA female with a history of anemia, combined systolic/diastolic heart failure, HTN, and history of polysubstance abuse. Initial ECHO (04/2010) EF 50-55% with grd 1 DD, LVIDd 62mm, mild to mod MR, RA and LA mildly dilated. Admitted 6/17 with recurrent HF after no taking meds for several months. Echo with EF 20-25%. Diuresed and discharged home with paramedicine following. D/c weight 165 lb.  She presents today for pharmacist-led HF medication titration. At last HF clinic visit on 6/28, Delene Loll was increased to 49-51 mg PO BID. She has felt well since last visit but still feels tired most of the day and has not been sleeping well throughout the night. Takes second dose of She does state that she has been    . Shortness of breath/dyspnea on exertion? no  . Orthopnea/PND? Yes - sleeps on 2 pillows (stable) . Edema? no . Lightheadedness/dizziness? Yes - seems to be orthostatic upon standing too quickly  . Daily weights at home? Yes - 161-167 lbs  . Blood pressure/heart rate monitoring at home? no . Following low-sodium/fluid-restricted diet? No - admits to adding salt to most foods and drinks > 2 L fluid/day -- states that she is trying to cut out high sodium foods like cheese  HF Medications: Carvedilol 3.125 mg PO BID Digoxin 0.125 mg PO daily Furosemide 40 mg PO daily with additional 40 mg for weight >164 lbs -- states she takes additional 40 mg most days KCl 10 mEq PO daily Entresto 49-51 mg PO BID Spironolactone 25 mg PO daily   Has the patient been experiencing any side effects to the medications prescribed?  Yes - some orthostatic hypotension  Does the patient have any problems obtaining medications due to transportation or finances?   No - [employer] Medicare + [place] Medicaid  Understanding of regimen: fair Understanding of indications: fair Potential of compliance: fair  Patient understands to avoid NSAIDs. Patient understands to avoid decongestants.     Pertinent Lab Values: . 05/16/16: Serum creatinine 1.39 (BL ~1.1), BUN 18, Potassium 3.3, Sodium 138 . 04/20/16: BNP 40, Digoxin 0.6   Vital Signs: . Weight: 166 lb (last HF clinic weight: 165 lb) . Blood pressure: 108/78 mmHg   . Heart rate: 70 bpm   1. Chronicsystolic CHF (EF 0000000), NICM. NYHA class II-III symptoms. - Volume status seems stable today (1 lb up from last visit) - Continue digoxin 0.125 mg daily, Entresto 49-51 mg BID, spironolactone 25 mg daily, furosemide 40 mg daily with additional 40 mg for weight > 164 lbs - Increase carvedilol to 6.25 mg BID - Basic disease state pathophysiology, medication indication, mechanism and side effects reviewed at length with patient and he verbalized understanding 2. HTN  - BP remains at goal - Continue Entresto, spironolactone - Increase carvedilol as above 3. Nicotine abuse - Continues to smoke. Encouraged to stop 4. Medical non-compliance - States she is improving her medication compliance - Discussed the importance of continued consistent use as prescribed   Plan: 1) Medication changes: Based on clinical presentation, vital signs and recent labs will increase carvedilol to 6.25 mg PO BID 2) Labs: BMET today  3) Follow-up: Dr. Haroldine Laws on 8/28   Ruta Hinds. Velva Harman, PharmD, BCPS, CPP Clinical Pharmacist Pager: [phone] Phone: [phone] 05/16/2016 2:46 PM   She is improving. Agree with PharmD note as above. Continue to titrate HF meds as tolerated.   Bensimhon, Daniel,MD 10:41 PM

## 2016-05-31 NOTE — Telephone Encounter (Signed)
Certified dismissal letter returned as undeliverable, unclaimed, return to sender after three attempts by [employer] using the updated address entered in [place]. Please advise. 05/31/16 DAJ

## 2016-06-06 ENCOUNTER — Telehealth (HOSPITAL_COMMUNITY): Payer: Self-pay | Admitting: Surgery

## 2016-06-06 NOTE — Telephone Encounter (Signed)
Stacey Lin will be discharged from the [facility] secondary to the inability to contact her to arrange home visits.

## 2016-06-20 ENCOUNTER — Encounter (HOSPITAL_COMMUNITY): Payer: Medicare Other | Admitting: Internal Medicine

## 2016-07-25 ENCOUNTER — Encounter (HOSPITAL_COMMUNITY): Payer: Medicare Other | Admitting: Internal Medicine

## 2016-08-06 ENCOUNTER — Emergency Department (HOSPITAL_COMMUNITY)
Admission: EM | Admit: 2016-08-06 | Discharge: 2016-08-06 | Discharge status: Against Medical Advice | Payer: Medicare Other

## 2016-08-17 ENCOUNTER — Other Ambulatory Visit (HOSPITAL_COMMUNITY): Payer: Self-pay | Admitting: *Deleted

## 2016-08-17 ENCOUNTER — Telehealth: Payer: Self-pay | Admitting: Gastroenterology

## 2016-08-17 NOTE — Telephone Encounter (Signed)
routing to DS

## 2016-08-17 NOTE — Telephone Encounter (Signed)
TRIAGE FOR SCREENING TCS W/ MAC DUE TO PMHX; POLYSUBSTANCE ABUSE.

## 2016-08-18 NOTE — Telephone Encounter (Signed)
LMOM to call. Mailing a letter to call also.

## 2016-08-19 ENCOUNTER — Other Ambulatory Visit (HOSPITAL_COMMUNITY): Payer: Self-pay | Admitting: *Deleted

## 2016-08-19 MED ORDER — DIGOXIN 125 MCG PO TABS: 0.1250 mg | ORAL_TABLET | Freq: Every day | ORAL | 3 refills | Status: DC

## 2016-08-19 MED ORDER — POTASSIUM CHLORIDE ER 10 MEQ PO TBCR: 10.0000 meq | EXTENDED_RELEASE_TABLET | Freq: Every day | ORAL | 3 refills | Status: DC

## 2016-08-26 DIAGNOSIS — H1013 Acute atopic conjunctivitis, bilateral: Secondary | ICD-10-CM | POA: Diagnosis not present

## 2016-08-29 ENCOUNTER — Telehealth: Payer: Self-pay

## 2016-08-29 NOTE — Telephone Encounter (Signed)
[phone] pt received letter to schedule tcs

## 2016-09-02 NOTE — Telephone Encounter (Signed)
LMOM to return call.

## 2016-09-08 ENCOUNTER — Telehealth: Payer: Self-pay

## 2016-09-08 NOTE — Telephone Encounter (Signed)
Triaged today.  

## 2016-09-09 ENCOUNTER — Other Ambulatory Visit: Payer: Self-pay

## 2016-09-09 DIAGNOSIS — Z1211 Encounter for screening for malignant neoplasm of colon: Secondary | ICD-10-CM

## 2016-09-09 NOTE — Telephone Encounter (Signed)
See separate triage.  

## 2016-09-09 NOTE — Telephone Encounter (Signed)
MOVI PREP SPLIT DOSING, REGULAR BREAKFAST. CLEAR LIQUIDS AFTER 9 AM.  

## 2016-09-09 NOTE — Telephone Encounter (Signed)
Gastroenterology Pre-Procedure Review  Request Date: 10/04/2016 Requesting Physician: Dr. Oneida Alar   PATIENT REVIEW QUESTIONS: The patient responded to the following health history questions as indicated:    1. Diabetes Melitis: no 2. Joint replacements in the past 12 months: no 3. Major health problems in the past 3 months: no 4. Has an artificial valve or MVP: no 5. Has a defibrillator: no 6. Has been advised in past to take antibiotics in advance of a procedure like teeth cleaning: no 7. Family history of colon cancer: no  8. Alcohol Use: no 9. History of sleep apnea: no  10. History of coronary artery or other vascular stents placed within the last 12 months: no    MEDICATIONS & ALLERGIES:    Patient reports the following regarding taking any blood thinners:   Plavix? no Aspirin? no Coumadin? no Brilinta? no Xarelto? no Eliquis? no Pradaxa? no Savaysa? no Effient? no  Patient confirms/reports the following medications:  Current Outpatient Prescriptions  Medication Sig Dispense Refill  . acetaminophen (TYLENOL) 500 MG tablet Take 1,500 mg by mouth every 6 (six) hours as needed for headache (pain).     . budesonide-formoterol (SYMBICORT) 80-4.5 MCG/ACT inhaler Take 2 puffs first thing in am and then another 2 puffs about 12 hours later. 1 Inhaler 11  . carvedilol (COREG) 6.25 MG tablet Take 1 tablet (6.25 mg total) by mouth 2 (two) times daily. 60 tablet 3  . digoxin (LANOXIN) 0.125 MG tablet Take 1 tablet (0.125 mg total) by mouth daily. 30 tablet 3  . furosemide (LASIX) 40 MG tablet Take 1 tablet (40 mg total) by mouth daily. Take extra tablet once daily for weight 165 lbs or greater. (Patient taking differently: Take 40 mg by mouth 2 (two) times daily. Take extra tablet once daily for weight 165 lbs or greater.) 60 tablet 3  . HYDROcodone-acetaminophen (NORCO) 7.5-325 MG tablet Take 1 tablet by mouth every 6 (six) hours as needed for moderate pain.    . potassium chloride  (K-DUR) 10 MEQ tablet Take 1 tablet (10 mEq total) by mouth daily. 30 tablet 3  . sacubitril-valsartan (ENTRESTO) 49-51 MG Take 1 tablet by mouth 2 (two) times daily. 60 tablet 6  . spironolactone (ALDACTONE) 25 MG tablet Take 1 tablet (25 mg total) by mouth daily. 30 tablet 30   No current facility-administered medications for this visit.     Patient confirms/reports the following allergies:  No Known Allergies  No orders of the defined types were placed in this encounter.   AUTHORIZATION INFORMATION Primary Insurance:  ID #:   Group #:  Pre-Cert / Auth required:  Pre-Cert / Auth #:   Secondary Insurance:  ID #:   Group #:  Pre-Cert / Auth required: Pre-Cert / Auth #:   SCHEDULE INFORMATION: Procedure has been scheduled as follows:  Date:  10/04/2016               Time:  10:15 AM Location: [facility] short Stay  This Gastroenterology Pre-Precedure Review Form is being routed to the following provider(s): Barney Drain, MD

## 2016-09-12 DIAGNOSIS — H1013 Acute atopic conjunctivitis, bilateral: Secondary | ICD-10-CM | POA: Diagnosis not present

## 2016-09-12 MED ORDER — PEG-KCL-NACL-NASULF-NA ASC-C 100 G PO SOLR: 1.0000 | ORAL | 0 refills | Status: DC

## 2016-09-12 NOTE — Telephone Encounter (Signed)
Rx sent to the pharmacy and instructions mailed to pt.  

## 2016-09-14 ENCOUNTER — Inpatient Hospital Stay (HOSPITAL_COMMUNITY): Admission: RE | Admit: 2016-09-14 | Payer: Medicare Other | Source: Ambulatory Visit | Admitting: Internal Medicine

## 2016-09-19 NOTE — Telephone Encounter (Signed)
Movie prep not covered by the insurance. Sending in [place] and mailing new instructions.

## 2016-09-20 ENCOUNTER — Other Ambulatory Visit (HOSPITAL_COMMUNITY): Payer: Self-pay | Admitting: Internal Medicine

## 2016-09-20 NOTE — Telephone Encounter (Signed)
Tried to call pt to let her know I was sending in a cheaper prep. Could not leave a Vm. Mailing the new instructions with note to disregard the previous instructions.

## 2016-09-27 DIAGNOSIS — M542 Cervicalgia: Secondary | ICD-10-CM | POA: Diagnosis not present

## 2016-09-27 DIAGNOSIS — M7581 Other shoulder lesions, right shoulder: Secondary | ICD-10-CM | POA: Diagnosis not present

## 2016-09-28 NOTE — Patient Instructions (Signed)
Stacey Lin  09/28/2016     @PREFPERIOPPHARMACY @   Your procedure is scheduled on  10/04/2016   Report to [facility] at  815  A.M.  Call this number if you have problems the morning of surgery:  [phone]   Remember:  Do not eat food or drink liquids after midnight.  Take these medicines the morning of surgery with A SIP OF WATER  Coreg, digoxin,hydrocodone, entresto.   Do not wear jewelry, make-up or nail polish.  Do not wear lotions, powders, or perfumes, or deoderant.  Do not shave 48 hours prior to surgery.  Men may shave face and neck.  Do not bring valuables to the hospital.  [facility] is not responsible for any belongings or valuables.  Contacts, dentures or bridgework may not be worn into surgery.  Leave your suitcase in the car.  After surgery it may be brought to your room.  For patients admitted to the hospital, discharge time will be determined by your treatment team.  Patients discharged the day of surgery will not be allowed to drive home.   Name and phone number of your driver:   family Special instructions:  Follow the diet and prep instructions given to you by Dr Nona Dell office.  Please read over the following fact sheets that you were given. Anesthesia Post-op Instructions and Care and Recovery After Surgery       Colonoscopy, Adult A colonoscopy is an exam to look at the entire large intestine. During the exam, a lubricated, bendable tube is inserted into the anus and then passed into the rectum, colon, and other parts of the large intestine. A colonoscopy is often done as a part of normal colorectal screening or in response to certain symptoms, such as anemia, persistent diarrhea, abdominal pain, and blood in the stool. The exam can help screen for and diagnose medical problems, including:  Tumors.  Polyps.  Inflammation.  Areas of bleeding. Tell a health care provider about:  Any allergies you have.  All medicines you  are taking, including vitamins, herbs, eye drops, creams, and over-the-counter medicines.  Any problems you or family members have had with anesthetic medicines.  Any blood disorders you have.  Any surgeries you have had.  Any medical conditions you have.  Any problems you have had passing stool. What are the risks? Generally, this is a safe procedure. However, problems may occur, including:  Bleeding.  A tear in the intestine.  A reaction to medicines given during the exam.  Infection (rare). What happens before the procedure? Eating and drinking restrictions  Follow instructions from your health care provider about eating and drinking, which may include:  A few days before the procedure - follow a low-fiber diet. Avoid nuts, seeds, dried fruit, raw fruits, and vegetables.  1-3 days before the procedure - follow a clear liquid diet. Drink only clear liquids, such as clear broth or bouillon, black coffee or tea, clear juice, clear soft drinks or sports drinks, gelatin desert, and popsicles. Avoid any liquids that contain red or purple dye.  On the day of the procedure - do not eat or drink anything during the 2 hours before the procedure, or within the time period that your health care provider recommends. Bowel prep  If you were prescribed an oral bowel prep to clean out your colon:  Take it as told by your health care provider. Starting the day before your procedure, you will  need to drink a large amount of medicated liquid. The liquid will cause you to have multiple loose stools until your stool is almost clear or light green.  If your skin or anus gets irritated from diarrhea, you may use these to relieve the irritation:  Medicated wipes, such as adult wet wipes with aloe and vitamin E.  A skin soothing-product like petroleum jelly.  If you vomit while drinking the bowel prep, take a break for up to 60 minutes and then begin the bowel prep again. If vomiting continues and  you cannot take the bowel prep without vomiting, call your health care provider. General instructions  Ask your health care provider about changing or stopping your regular medicines. This is especially important if you are taking diabetes medicines or blood thinners.  Plan to have someone take you home from the hospital or clinic. What happens during the procedure?  An IV tube may be inserted into one of your veins.  You will be given medicine to help you relax (sedative).  To reduce your risk of infection:  Your health care team will wash or sanitize their hands.  Your anal area will be washed with soap.  You will be asked to lie on your side with your knees bent.  Your health care provider will lubricate a long, thin, flexible tube. The tube will have a camera and a light on the end.  The tube will be inserted into your anus.  The tube will be gently eased through your rectum and colon.  Air will be delivered into your colon to keep it open. You may feel some pressure or cramping.  The camera will be used to take images during the procedure.  A small tissue sample may be removed from your body to be examined under a microscope (biopsy). If any potential problems are found, the tissue will be sent to a lab for testing.  If small polyps are found, your health care provider may remove them and have them checked for cancer cells.  The tube that was inserted into your anus will be slowly removed. The procedure may vary among health care providers and hospitals. What happens after the procedure?  Your blood pressure, heart rate, breathing rate, and blood oxygen level will be monitored until the medicines you were given have worn off.  Do not drive for 24 hours after the exam.  You may have a small amount of blood in your stool.  You may pass gas and have mild abdominal cramping or bloating due to the air that was used to inflate your colon during the exam.  It is up to you  to get the results of your procedure. Ask your health care provider, or the department performing the procedure, when your results will be ready. This information is not intended to replace advice given to you by your health care provider. Make sure you discuss any questions you have with your health care provider. Document Released: 10/07/2000 Document Revised: 04/29/2016 Document Reviewed: 12/22/2015 Elsevier Interactive Patient Education  2017 [employer]  Colonoscopy, Adult, Care After This sheet gives you information about how to care for yourself after your procedure. Your health care provider may also give you more specific instructions. If you have problems or questions, contact your health care provider. What can I expect after the procedure? After the procedure, it is common to have:  A small amount of blood in your stool for 24 hours after the procedure.  Some gas.  Mild abdominal cramping or bloating. Follow these instructions at home: General instructions  For the first 24 hours after the procedure:  Do not drive or use machinery.  Do not sign important documents.  Do not drink alcohol.  Do your regular daily activities at a slower pace than normal.  Eat soft, easy-to-digest foods.  Rest often.  Take over-the-counter or prescription medicines only as told by your health care provider.  It is up to you to get the results of your procedure. Ask your health care provider, or the department performing the procedure, when your results will be ready. Relieving cramping and bloating  Try walking around when you have cramps or feel bloated.  Apply heat to your abdomen as told by your health care provider. Use a heat source that your health care provider recommends, such as a moist heat pack or a heating pad.  Place a towel between your skin and the heat source.  Leave the heat on for 20-30 minutes.  Remove the heat if your skin turns bright red. This is especially  important if you are unable to feel pain, heat, or cold. You may have a greater risk of getting burned. Eating and drinking  Drink enough fluid to keep your urine clear or pale yellow.  Resume your normal diet as instructed by your health care provider. Avoid heavy or fried foods that are hard to digest.  Avoid drinking alcohol for as long as instructed by your health care provider. Contact a health care provider if:  You have blood in your stool 2-3 days after the procedure. Get help right away if:  You have more than a small spotting of blood in your stool.  You pass large blood clots in your stool.  Your abdomen is swollen.  You have nausea or vomiting.  You have a fever.  You have increasing abdominal pain that is not relieved with medicine. This information is not intended to replace advice given to you by your health care provider. Make sure you discuss any questions you have with your health care provider. Document Released: 05/24/2004 Document Revised: 07/04/2016 Document Reviewed: 12/22/2015 Elsevier Interactive Patient Education  2017 [place] Anesthesia is a term that refers to techniques, procedures, and medicines that help a person stay safe and comfortable during a medical procedure. Monitored anesthesia care, or sedation, is one type of anesthesia. Your anesthesia specialist may recommend sedation if you will be having a procedure that does not require you to be unconscious, such as:  Cataract surgery.  A dental procedure.  A biopsy.  A colonoscopy. During the procedure, you may receive a medicine to help you relax (sedative). There are three levels of sedation:  Mild sedation. At this level, you may feel awake and relaxed. You will be able to follow directions.  Moderate sedation. At this level, you will be sleepy. You may not remember the procedure.  Deep sedation. At this level, you will be asleep. You will not remember  the procedure. The more medicine you are given, the deeper your level of sedation will be. Depending on how you respond to the procedure, the anesthesia specialist may change your level of sedation or the type of anesthesia to fit your needs. An anesthesia specialist will monitor you closely during the procedure. Let your health care provider know about:  Any allergies you have.  All medicines you are taking, including vitamins, herbs, eye drops, creams, and over-the-counter medicines.  Any use of steroids (  by mouth or as a cream).  Any problems you or family members have had with sedatives and anesthetic medicines.  Any blood disorders you have.  Any surgeries you have had.  Any medical conditions you have, such as sleep apnea.  Whether you are pregnant or may be pregnant.  Any use of cigarettes, alcohol, or street drugs. What are the risks? Generally, this is a safe procedure. However, problems may occur, including:  Getting too much medicine (oversedation).  Nausea.  Allergic reaction to medicines.  Trouble breathing. If this happens, a breathing tube may be used to help with breathing. It will be removed when you are awake and breathing on your own.  Heart trouble.  Lung trouble. Before the procedure Staying hydrated  Follow instructions from your health care provider about hydration, which may include:  Up to 2 hours before the procedure - you may continue to drink clear liquids, such as water, clear fruit juice, black coffee, and plain tea. Eating and drinking restrictions  Follow instructions from your health care provider about eating and drinking, which may include:  8 hours before the procedure - stop eating heavy meals or foods such as meat, fried foods, or fatty foods.  6 hours before the procedure - stop eating light meals or foods, such as toast or cereal.  6 hours before the procedure - stop drinking milk or drinks that contain milk.  2 hours before the  procedure - stop drinking clear liquids. Medicines  Ask your health care provider about:  Changing or stopping your regular medicines. This is especially important if you are taking diabetes medicines or blood thinners.  Taking medicines such as aspirin and ibuprofen. These medicines can thin your blood. Do not take these medicines before your procedure if your health care provider instructs you not to. Tests and exams  You will have a physical exam.  You may have blood tests done to show:  How well your kidneys and liver are working.  How well your blood can clot.  General instructions  Plan to have someone take you home from the hospital or clinic.  If you will be going home right after the procedure, plan to have someone with you for 24 hours. What happens during the procedure?  Your blood pressure, heart rate, breathing, level of pain and overall condition will be monitored.  An IV tube will be inserted into one of your veins.  Your anesthesia specialist will give you medicines as needed to keep you comfortable during the procedure. This may mean changing the level of sedation.  The procedure will be performed. After the procedure  Your blood pressure, heart rate, breathing rate, and blood oxygen level will be monitored until the medicines you were given have worn off.  Do not drive for 24 hours if you received a sedative.  You may:  Feel sleepy, clumsy, or nauseous.  Feel forgetful about what happened after the procedure.  Have a sore throat if you had a breathing tube during the procedure.  Vomit. This information is not intended to replace advice given to you by your health care provider. Make sure you discuss any questions you have with your health care provider. Document Released: 07/06/2005 Document Revised: 03/18/2016 Document Reviewed: 01/31/2016 Elsevier Interactive Patient Education  2017 [place] POST-ANESTHESIA  IMMEDIATELY  FOLLOWING SURGERY:  Do not drive or operate machinery for the first twenty four hours after surgery.  Do not make any important decisions for twenty four hours  after surgery or while taking narcotic pain medications or sedatives.  If you develop intractable nausea and vomiting or a severe headache please notify your doctor immediately.  FOLLOW-UP:  Please make an appointment with your surgeon as instructed. You do not need to follow up with anesthesia unless specifically instructed to do so.  WOUND CARE INSTRUCTIONS (if applicable):  Keep a dry clean dressing on the anesthesia/puncture wound site if there is drainage.  Once the wound has quit draining you may leave it open to air.  Generally you should leave the bandage intact for twenty four hours unless there is drainage.  If the epidural site drains for more than 36-48 hours please call the anesthesia department.  QUESTIONS?:  Please feel free to call your physician or the hospital operator if you have any questions, and they will be happy to assist you.

## 2016-09-29 ENCOUNTER — Encounter (HOSPITAL_COMMUNITY)
Admission: RE | Admit: 2016-09-29 | Discharge: 2016-09-29 | Disposition: A | Discharge status: Home / Self Care | Payer: Medicare Other | Source: Ambulatory Visit | Attending: Gastroenterology | Admitting: Gastroenterology

## 2016-09-29 ENCOUNTER — Encounter (HOSPITAL_COMMUNITY): Payer: Self-pay

## 2016-09-29 NOTE — Telephone Encounter (Signed)
PT was unable to work out the pre-op appt and she has cancelled and wants me to call her after the first of the year to reschedule the colonoscopy.

## 2016-09-29 NOTE — Telephone Encounter (Signed)
REVIEWED-NO ADDITIONAL RECOMMENDATIONS. 

## 2016-10-04 ENCOUNTER — Ambulatory Visit (HOSPITAL_COMMUNITY): Admission: RE | Admit: 2016-10-04 | Payer: Medicare Other | Source: Ambulatory Visit | Admitting: Gastroenterology

## 2016-10-04 ENCOUNTER — Encounter (HOSPITAL_COMMUNITY): Admission: RE | Payer: Self-pay | Source: Ambulatory Visit

## 2016-10-04 SURGERY — COLONOSCOPY WITH PROPOFOL
Anesthesia: Monitor Anesthesia Care

## 2016-10-25 ENCOUNTER — Other Ambulatory Visit (HOSPITAL_COMMUNITY): Payer: Self-pay | Admitting: Internal Medicine

## 2016-11-01 NOTE — Telephone Encounter (Signed)
I mailed pt a letter to call when she is ready to reschedule the colonoscopy.

## 2016-11-24 ENCOUNTER — Inpatient Hospital Stay (HOSPITAL_COMMUNITY): Admission: RE | Admit: 2016-11-24 | Payer: Medicare Other | Source: Ambulatory Visit

## 2017-01-05 DIAGNOSIS — Z79891 Long term (current) use of opiate analgesic: Secondary | ICD-10-CM | POA: Diagnosis not present

## 2017-01-09 ENCOUNTER — Other Ambulatory Visit (HOSPITAL_COMMUNITY): Payer: Self-pay | Admitting: Internal Medicine

## 2017-02-17 ENCOUNTER — Other Ambulatory Visit (HOSPITAL_COMMUNITY): Payer: Self-pay | Admitting: Internal Medicine

## 2017-03-02 DIAGNOSIS — I10 Essential (primary) hypertension: Secondary | ICD-10-CM | POA: Diagnosis not present

## 2017-03-02 DIAGNOSIS — M25569 Pain in unspecified knee: Secondary | ICD-10-CM | POA: Diagnosis not present

## 2017-03-02 DIAGNOSIS — F17200 Nicotine dependence, unspecified, uncomplicated: Secondary | ICD-10-CM | POA: Diagnosis not present

## 2017-03-02 DIAGNOSIS — I509 Heart failure, unspecified: Secondary | ICD-10-CM | POA: Diagnosis not present

## 2017-03-22 DIAGNOSIS — Z79891 Long term (current) use of opiate analgesic: Secondary | ICD-10-CM | POA: Diagnosis not present

## 2017-03-23 ENCOUNTER — Other Ambulatory Visit (HOSPITAL_COMMUNITY): Payer: Self-pay | Admitting: Cardiology

## 2017-03-23 ENCOUNTER — Encounter (HOSPITAL_COMMUNITY): Payer: Medicare Other

## 2017-03-23 MED ORDER — SPIRONOLACTONE 25 MG PO TABS: 25.0000 mg | ORAL_TABLET | Freq: Every day | ORAL | 0 refills | Status: DC

## 2017-03-23 MED ORDER — SACUBITRIL-VALSARTAN 49-51 MG PO TABS: 1.0000 | ORAL_TABLET | Freq: Two times a day (BID) | ORAL | 0 refills | Status: DC

## 2017-03-23 MED ORDER — FUROSEMIDE 40 MG PO TABS: 40.0000 mg | ORAL_TABLET | Freq: Every day | ORAL | 0 refills | Status: DC

## 2017-03-23 MED ORDER — DIGOXIN 125 MCG PO TABS: 125.0000 ug | ORAL_TABLET | Freq: Every day | ORAL | 0 refills | Status: DC

## 2017-03-23 MED ORDER — POTASSIUM CHLORIDE ER 10 MEQ PO TBCR: 10.0000 meq | EXTENDED_RELEASE_TABLET | Freq: Every day | ORAL | 0 refills | Status: DC

## 2017-03-23 MED ORDER — CARVEDILOL 6.25 MG PO TABS: 6.2500 mg | ORAL_TABLET | Freq: Two times a day (BID) | ORAL | 0 refills | Status: DC

## 2017-03-23 MED FILL — FUROSEMIDE 40 MG TABLET: 40 | 30 days supply | Qty: 60 | Fill #0

## 2017-03-23 MED FILL — CARVEDILOL 6.25 MG TABLET: 6.25 | 30 days supply | Qty: 60 | Fill #0

## 2017-03-23 MED FILL — ENTRESTO 49 MG-51 MG TABLET: 49-51 | 30 days supply | Qty: 60 | Fill #0

## 2017-03-23 MED FILL — DIGOXIN 0.125 MG TABLET: 125 | 30 days supply | Qty: 30 | Fill #0

## 2017-03-23 MED FILL — SPIRONOLACTONE 25 MG TABLET: 25 | 30 days supply | Qty: 30 | Fill #0

## 2017-03-23 MED FILL — POTASSIUM CL 10 MEQ TAB SA: 10 | 30 days supply | Qty: 30 | Fill #0

## 2017-04-04 ENCOUNTER — Emergency Department (HOSPITAL_COMMUNITY): Payer: Medicare Other

## 2017-04-04 ENCOUNTER — Emergency Department (HOSPITAL_COMMUNITY)
Admission: EM | Admit: 2017-04-04 | Discharge: 2017-04-04 | Disposition: A | Discharge status: Home / Self Care | Payer: Medicare Other | Attending: Emergency Medicine | Admitting: Emergency Medicine

## 2017-04-04 ENCOUNTER — Encounter (HOSPITAL_COMMUNITY): Payer: Self-pay

## 2017-04-04 DIAGNOSIS — Z79899 Other long term (current) drug therapy: Secondary | ICD-10-CM | POA: Insufficient documentation

## 2017-04-04 DIAGNOSIS — I5042 Chronic combined systolic (congestive) and diastolic (congestive) heart failure: Secondary | ICD-10-CM | POA: Insufficient documentation

## 2017-04-04 DIAGNOSIS — I110 Hypertensive heart disease with heart failure: Secondary | ICD-10-CM | POA: Diagnosis not present

## 2017-04-04 DIAGNOSIS — E119 Type 2 diabetes mellitus without complications: Secondary | ICD-10-CM | POA: Insufficient documentation

## 2017-04-04 DIAGNOSIS — J449 Chronic obstructive pulmonary disease, unspecified: Secondary | ICD-10-CM | POA: Diagnosis not present

## 2017-04-04 DIAGNOSIS — L03116 Cellulitis of left lower limb: Secondary | ICD-10-CM

## 2017-04-04 DIAGNOSIS — Z87891 Personal history of nicotine dependence: Secondary | ICD-10-CM | POA: Diagnosis not present

## 2017-04-04 DIAGNOSIS — M79672 Pain in left foot: Secondary | ICD-10-CM | POA: Diagnosis not present

## 2017-04-04 LAB — CBC WITH DIFFERENTIAL/PLATELET
BASOS PCT: 0 %
Basophils Absolute: 0 10*3/uL (ref 0.0–0.1)
EOS ABS: 0.3 10*3/uL (ref 0.0–0.7)
EOS PCT: 2 %
HCT: 40 % (ref 36.0–46.0)
HEMOGLOBIN: 13.2 g/dL (ref 12.0–15.0)
LYMPHS ABS: 2.4 10*3/uL (ref 0.7–4.0)
Lymphocytes Relative: 16 %
MCH: 30.1 pg (ref 26.0–34.0)
MCHC: 33 g/dL (ref 30.0–36.0)
MCV: 91.1 fL (ref 78.0–100.0)
MONOS PCT: 6 %
Monocytes Absolute: 0.9 10*3/uL (ref 0.1–1.0)
NEUTROS PCT: 76 %
Neutro Abs: 11.8 10*3/uL — ABNORMAL HIGH (ref 1.7–7.7)
Platelets: 349 10*3/uL (ref 150–400)
RBC: 4.39 MIL/uL (ref 3.87–5.11)
RDW: 12.7 % (ref 11.5–15.5)
WBC: 15.4 10*3/uL — ABNORMAL HIGH (ref 4.0–10.5)

## 2017-04-04 LAB — BASIC METABOLIC PANEL
Anion gap: 11 (ref 5–15)
BUN: 22 mg/dL — AB (ref 6–20)
CALCIUM: 9.8 mg/dL (ref 8.9–10.3)
CO2: 26 mmol/L (ref 22–32)
Chloride: 100 mmol/L — ABNORMAL LOW (ref 101–111)
Creatinine, Ser: 1.4 mg/dL — ABNORMAL HIGH (ref 0.44–1.00)
GFR calc Af Amer: 49 mL/min — ABNORMAL LOW (ref >60)
GFR calc non Af Amer: 42 mL/min — ABNORMAL LOW (ref >60)
GLUCOSE: 196 mg/dL — AB (ref 65–99)
Potassium: 4 mmol/L (ref 3.5–5.1)
Sodium: 137 mmol/L (ref 135–145)

## 2017-04-04 MED ORDER — OXYCODONE-ACETAMINOPHEN 5-325 MG PO TABS
2.0000 | ORAL_TABLET | Freq: Once | ORAL | Status: AC
  Administered 2017-04-04: 2 via ORAL
  Filled 2017-04-04: qty 2

## 2017-04-04 MED ORDER — CLINDAMYCIN HCL 150 MG PO CAPS: 150.0000 mg | ORAL_CAPSULE | Freq: Four times a day (QID) | ORAL | 0 refills | Status: DC

## 2017-04-04 MED ORDER — HYDROCODONE-ACETAMINOPHEN 5-325 MG PO TABS: 2.0000 | ORAL_TABLET | ORAL | 0 refills | Status: DC | PRN

## 2017-04-04 NOTE — ED Provider Notes (Addendum)
[place] DEPT Provider Note   CSN: [id number] Arrival date & time: 04/04/17  1554     History   Chief Complaint No chief complaint on file.   HPI Stacey Lin is a 51 y.o. female.  51 year old female presents with acute onset of left lateral foot pain just prior to arrival. Denies any fever or chills. Is unsure if she was bitten by an insect. Pain is been sharp and persistent and worse with any movement or standing. States that she has been dizzy and lightheaded. No recent injuries. No vomiting. No treatment use prior to arrival      Past Medical History:  Diagnosis Date  . Anemia   . Anginal pain ([place])    2 months  . Arrhythmia   . Asthma   . CHF (congestive heart failure) ([place])    1990s  . Chronic headaches   . COPD (chronic obstructive pulmonary disease) ([place])    ??  . Hypercholesteremia   . Hypertension     Patient Active Problem List   Diagnosis Date Noted  . Dyspnea 04/14/2016  . Streptococcus pneumoniae pneumonia ([place]) 04/07/2016  . Acute on chronic systolic (congestive) heart failure ([place])   . Acute respiratory failure with hypoxia and hypercarbia ([place]) 04/03/2016  . Elevated lactic acid level 04/03/2016  . Leukocytosis 04/03/2016  . New onset type 2 diabetes mellitus ([place]) 04/03/2016  . Abnormal urinalysis 04/03/2016  . Trichomonas infection 04/03/2016  . Non compliance w medication regimen 01/12/2016  . AKI (acute kidney injury) ([place]) 03/14/2014  . History of Chronic combined systolic and diastolic congestive heart failure ([place]) 03/14/2014  . Musculoskeletal chest pain 03/14/2014  . Periodic health assessment, general screening, adult 10/03/2013  . Mitral valve regurgitation 08/22/2013  . FIBROIDS, UTERUS 05/11/2010  . ANEMIA, SECONDARY TO BLOOD LOSS 05/11/2010  . Asthmatic bronchitis 05/11/2010  . TOBACCO USER 07/07/2009  . Essential hypertension, benign 06/19/2009  . MENORRHAGIA 06/19/2009  . COCAINE ABUSE, HX OF 06/19/2009    Past  Surgical History:  Procedure Laterality Date  . LEFT AND RIGHT HEART CATHETERIZATION WITH CORONARY ANGIOGRAM N/A 06/06/2014   Procedure: LEFT AND RIGHT HEART CATHETERIZATION WITH CORONARY ANGIOGRAM;  Surgeon: Jolaine Artist, MD;  Location: [facility] CATH LAB;  Service: Cardiovascular;  Laterality: N/A;  . MULTIPLE EXTRACTIONS WITH ALVEOLOPLASTY Bilateral 07/24/2015   Procedure: MULTIPLE EXTRACTIONS WITH ALVEOLOPLASTY,  BILATERAL TORI ;  Surgeon: Diona Browner, DDS;  Location: [facility];  Service: Oral Surgery;  Laterality: Bilateral;  . TUBAL LIGATION  1986    OB History    No data available       Home Medications    Prior to Admission medications   Medication Sig Start Date End Date Taking? Authorizing Provider  acetaminophen (TYLENOL) 500 MG tablet Take 1,500 mg by mouth every 6 (six) hours as needed for headache (pain).     [provider]  budesonide-formoterol (SYMBICORT) 80-4.5 MCG/ACT inhaler Take 2 puffs first thing in am and then another 2 puffs about 12 hours later. 04/14/16   Tanda Rockers, MD  carvedilol (COREG) 6.25 MG tablet Take 1 tablet (6.25 mg total) by mouth 2 (two) times daily. 03/23/17   Shirley Friar, PA-C  digoxin (DIGITEK) 0.125 MG tablet Take 1 tablet (125 mcg total) by mouth daily. Needs office visit 03/23/17   Shirley Friar, PA-C  furosemide (LASIX) 40 MG tablet Take 1 tablet (40 mg total) by mouth daily. May take additional tab in PM as needed. Needs office visit 03/23/17  Shirley Friar, PA-C  HYDROcodone-acetaminophen [facility]) 7.5-325 MG tablet Take 1 tablet by mouth every 6 (six) hours as needed for moderate pain.    [provider]  peg 3350 powder (MOVIPREP) 100 g SOLR Take 1 kit (200 g total) by mouth as directed. 09/12/16   Fields, Marga Melnick, MD  potassium chloride (K-DUR) 10 MEQ tablet Take 1 tablet (10 mEq total) by mouth daily. Needs office visit 03/23/17   Shirley Friar, PA-C  sacubitril-valsartan (ENTRESTO)  49-51 MG Take 1 tablet by mouth 2 (two) times daily. Needs office visit 03/23/17   Shirley Friar, PA-C  spironolactone (ALDACTONE) 25 MG tablet Take 1 tablet (25 mg total) by mouth daily. 03/23/17   Shirley Friar, PA-C    Family History Family History  Problem Relation Age of Onset  . Hypertension Mother   . Allergies Mother   . Asthma Mother   . Heart disease Mother   . Stomach cancer Mother        Deceased, 47  . Seizures Mother   . Hypertension Father        Deceased  . Hypertension Maternal Grandmother   . Healthy Brother   . Healthy Son   . Healthy Daughter     Social History Social History  Substance Use Topics  . Smoking status: Former Smoker    Packs/day: 0.10    Years: 20.00    Types: Cigarettes    Quit date: 04/08/2016  . Smokeless tobacco: Never Used     Comment: Smokes occasionally when someone has a cig to have, doesn't buy them.  . Alcohol use No     Allergies   Patient has no known allergies.   Review of Systems Review of Systems  All other systems reviewed and are negative.    Physical Exam Updated Vital Signs BP (!) 89/66   Pulse 80   Temp 98.1 F (36.7 C) (Oral)   Resp (!) 22   Ht 1.524 m (5')   Wt 74.8 kg (165 lb)   LMP  (LMP Unknown)   SpO2 99%   BMI 32.22 kg/m   Physical Exam  Constitutional: She is oriented to person, place, and time. She appears well-developed and well-nourished.  Non-toxic appearance. No distress.  HENT:  Head: Normocephalic and atraumatic.  Eyes: Conjunctivae, EOM and lids are normal. Pupils are equal, round, and reactive to light.  Neck: Normal range of motion. Neck supple. No tracheal deviation present. No thyroid mass present.  Cardiovascular: Normal rate, regular rhythm and normal heart sounds.  Exam reveals no gallop.   No murmur heard. Pulmonary/Chest: Effort normal and breath sounds normal. No stridor. No respiratory distress. She has no decreased breath sounds. She has no wheezes.  She has no rhonchi. She has no rales.  Abdominal: Soft. Normal appearance and bowel sounds are normal. She exhibits no distension. There is no tenderness. There is no rebound and no CVA tenderness.  Musculoskeletal: Normal range of motion. She exhibits no edema or tenderness.       Feet:  Neurological: She is alert and oriented to person, place, and time. She has normal strength. No cranial nerve deficit or sensory deficit. GCS eye subscore is 4. GCS verbal subscore is 5. GCS motor subscore is 6.  Skin: Skin is warm and dry. No abrasion and no rash noted.  Psychiatric: She has a normal mood and affect. Her speech is normal and behavior is normal.  Nursing note and vitals reviewed.  ED Treatments / Results  Labs (all labs ordered are listed, but only abnormal results are displayed) Labs Reviewed  CBC WITH DIFFERENTIAL/PLATELET  BASIC METABOLIC PANEL    EKG  EKG Interpretation None       Radiology No results found.  Procedures Procedures (including critical care time)  Medications Ordered in ED Medications  oxyCODONE-acetaminophen (PERCOCET/ROXICET) 5-325 MG per tablet 2 tablet (not administered)     Initial Impression / Assessment and Plan / ED Course  I have reviewed the triage vital signs and the nursing notes.  Pertinent labs & imaging results that were available during my care of the patient were reviewed by me and considered in my medical decision making (see chart for details).     Foot x-rays negative. Patient medicated for it with Percocet here. Suspect beginning cellulitis will place on antibiotics and return precautions given  Final Clinical Impressions(s) / ED Diagnoses   Final diagnoses:  None    New Prescriptions New Prescriptions   No medications on file     Lacretia Leigh, MD 04/04/17 Malena Peer, MD 04/04/17 [phone]

## 2017-04-04 NOTE — ED Triage Notes (Signed)
Pt presents with onset of L foot redness and swelling noted when she awoke yesterday.  Pt denies any injury, unable to bear weight.  Pt also reports dizziness and lightheadedness that began this morning, causing her to fall out of bed.

## 2017-04-04 NOTE — Progress Notes (Signed)
Orthopedic Tech Progress Note Patient Details:  Stacey Lin 08/09/1966 [id number]  Ortho Devices Type of Ortho Device: Crutches Ortho Device/Splint Interventions: Ordered, Adjustment   Braulio Bosch 04/04/2017, 7:42 PM

## 2017-04-05 ENCOUNTER — Ambulatory Visit (HOSPITAL_COMMUNITY)
Admission: RE | Admit: 2017-04-05 | Discharge: 2017-04-05 | Disposition: A | Discharge status: Home / Self Care | Payer: Medicare Other | Source: Ambulatory Visit | Attending: Internal Medicine | Admitting: Internal Medicine

## 2017-04-05 ENCOUNTER — Telehealth (HOSPITAL_COMMUNITY): Payer: Self-pay

## 2017-04-05 ENCOUNTER — Encounter (HOSPITAL_COMMUNITY): Payer: Self-pay

## 2017-04-05 VITALS — BP 110/80 | HR 84 | Wt 156.0 lb

## 2017-04-05 DIAGNOSIS — I110 Hypertensive heart disease with heart failure: Secondary | ICD-10-CM | POA: Diagnosis not present

## 2017-04-05 DIAGNOSIS — I5022 Chronic systolic (congestive) heart failure: Secondary | ICD-10-CM | POA: Diagnosis not present

## 2017-04-05 DIAGNOSIS — Z79899 Other long term (current) drug therapy: Secondary | ICD-10-CM | POA: Insufficient documentation

## 2017-04-05 DIAGNOSIS — I5042 Chronic combined systolic (congestive) and diastolic (congestive) heart failure: Secondary | ICD-10-CM | POA: Diagnosis not present

## 2017-04-05 DIAGNOSIS — I429 Cardiomyopathy, unspecified: Secondary | ICD-10-CM | POA: Insufficient documentation

## 2017-04-05 DIAGNOSIS — R0600 Dyspnea, unspecified: Secondary | ICD-10-CM | POA: Diagnosis not present

## 2017-04-05 DIAGNOSIS — L0390 Cellulitis, unspecified: Secondary | ICD-10-CM | POA: Insufficient documentation

## 2017-04-05 DIAGNOSIS — Z9119 Patient's noncompliance with other medical treatment and regimen: Secondary | ICD-10-CM | POA: Insufficient documentation

## 2017-04-05 DIAGNOSIS — I340 Nonrheumatic mitral (valve) insufficiency: Secondary | ICD-10-CM | POA: Diagnosis not present

## 2017-04-05 DIAGNOSIS — I10 Essential (primary) hypertension: Secondary | ICD-10-CM

## 2017-04-05 DIAGNOSIS — F17210 Nicotine dependence, cigarettes, uncomplicated: Secondary | ICD-10-CM | POA: Insufficient documentation

## 2017-04-05 DIAGNOSIS — J449 Chronic obstructive pulmonary disease, unspecified: Secondary | ICD-10-CM | POA: Diagnosis not present

## 2017-04-05 DIAGNOSIS — F17200 Nicotine dependence, unspecified, uncomplicated: Secondary | ICD-10-CM

## 2017-04-05 MED ORDER — CARVEDILOL 6.25 MG PO TABS
6.2500 mg | ORAL_TABLET | Freq: Two times a day (BID) | ORAL | 3 refills | Status: DC
Start: 2017-04-05 — End: 2017-04-05

## 2017-04-05 MED ORDER — SACUBITRIL-VALSARTAN 49-51 MG PO TABS: 1.0000 | ORAL_TABLET | Freq: Two times a day (BID) | ORAL | 3 refills | Status: DC

## 2017-04-05 MED ORDER — DIGOXIN 125 MCG PO TABS: 125.0000 ug | ORAL_TABLET | Freq: Every day | ORAL | 3 refills | Status: DC

## 2017-04-05 MED ORDER — FUROSEMIDE 40 MG PO TABS: 40.0000 mg | ORAL_TABLET | Freq: Every day | ORAL | 3 refills | Status: DC

## 2017-04-05 MED ORDER — CARVEDILOL 6.25 MG PO TABS: 6.2500 mg | ORAL_TABLET | Freq: Two times a day (BID) | ORAL | 3 refills | Status: DC

## 2017-04-05 MED ORDER — POTASSIUM CHLORIDE ER 10 MEQ PO TBCR: 10.0000 meq | EXTENDED_RELEASE_TABLET | Freq: Every day | ORAL | 3 refills | Status: DC

## 2017-04-05 MED ORDER — SPIRONOLACTONE 25 MG PO TABS: 25.0000 mg | ORAL_TABLET | Freq: Every day | ORAL | 3 refills | Status: DC

## 2017-04-05 NOTE — Progress Notes (Signed)
Patient ID: Stacey Lin, female   DOB: 1966/07/06, 51 y.o.   MRN: [id number]    Advanced Heart Failure Clinic Note   PCP: [facility] Pulmonologist: Dr. Shelle Iron  HPI: Stacey Lin is a 51 yo female with a history of anemia, combined systolic/diastolic heart failure, HTN, and history of polysubstance abuse. Initial ECHO (04/2010) EF 50-55% with grd 1 DD, LVIDd 47mm, mild to mod MR, RA and LA mildly dilated.   Admitted to the hospital 08/19/13-08/29/13 with A/C systolic HF. Discharge weight 144 lbs.  Seen in ED 04/04/17 for cellulitis. Started on clindamycin.   She presents today for follow up. Last seen in HF clinic 04/2016. Weight down 10 lbs from last year. Has been doing OK overall apart from cellulitis. Mild SOB with exertion.  She lives on the second floor and is a little winded going up the steps. Occasional lightheadedness with rapid standing, worse with cellulitis. BP was low in ED (80-90s systolic). Took all of her medications this morning. Denies non-compliance. Trying to watch her salt and fluid. Denies orthopnea.   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 Mod MR and Mod-sev TR Echo (1/15): EF 45%, mild to moderate AI, moderate MR, RV normal size with mildly decreased systolic function.  CPX (5/15): FVC 66%, FEV1 59%, ratio 72%, peak VO2 15.7, slope 22.6 => mildly decreased functional capacity limited primarily by lungs.  Echo (05/2014): EF 55-60%, mild/mod AI, grade I DD and mild MR R/LHC (06/06/14): ectatic coronaries (suspect d/t HTN) no significant arthrosclerosis and normal EF. No significant MR noted Echo (7/16): EF 60-65%, moderate AI.  Echo (11/16): EF 55%, Mild LAE Echo 6/17: EF 20-25% severe central MR  Labs (1/15): K 4.1, creatinine 0.96, hemoglobin  10.1 Labs (12/04/13) K 4.3 Creatinine 0.97 Labs (4/15) K+4.1, Creatinine 0.88 Labs (6/15) Hgb 10.9, K 3.8, creatinine 1.19 Labs (2/16) HCT 35.1, TSH normal Labs (5/16) K 3.3, creatinine 1.38 Labs (9/16): K 3.9, creatinine 1.24, HCT 38.3 Labs (10/16): K 3.9 Creatinine 0.98 TSH 1.846  Labs (11/16): K 3.8 , Creatinine 1.49  SH: Living in [place]. No ETOH or drug use. 2-3 cigs/day.   FH: Mother deceased: Dementia, seizures, stomach cancer        Father deceased: HTN  Review of systems complete and found to be negative unless listed in HPI.    Past Medical History:  Diagnosis Date  . Anemia   . Anginal pain (HCC)    2 months  . Arrhythmia   . Asthma   . CHF (congestive heart failure) (HCC)    1990s  . Chronic headaches   . COPD (chronic obstructive pulmonary disease) (HCC)    ??  . Hypercholesteremia   . Hypertension     Current Outpatient Prescriptions  Medication Sig Dispense Refill  . acetaminophen (TYLENOL) 500 MG tablet Take 1,500 mg by mouth every 6 (six) hours as needed for headache (pain).     . budesonide-formoterol (SYMBICORT) 80-4.5 MCG/ACT inhaler Take 2 puffs first thing in am and then another 2 puffs about 12 hours later. 1 Inhaler 11  . carvedilol (COREG) 6.25 MG tablet Take 1 tablet (6.25 mg total) by mouth 2 (two) times daily. 60 tablet 0  . clindamycin (CLEOCIN) 150 MG capsule Take 1 capsule (150 mg total) by mouth every  6 (six) hours. 28 capsule 0  . digoxin (DIGITEK) 0.125 MG tablet Take 1 tablet (125 mcg total) by mouth daily. Needs office visit 30 tablet 0  . furosemide (LASIX) 40 MG tablet Take 1 tablet (40 mg total) by mouth daily. May take additional tab in PM as needed. Needs office visit 60 tablet 0  . HYDROcodone-acetaminophen (NORCO) 7.5-325 MG tablet Take 1 tablet by mouth every 6 (six) hours as needed for moderate pain.    Marland Kitchen HYDROcodone-acetaminophen (NORCO/VICODIN) 5-325 MG tablet Take 2 tablets by mouth every 4 (four) hours as needed. 10  tablet 0  . peg 3350 powder (MOVIPREP) 100 g SOLR Take 1 kit (200 g total) by mouth as directed. 1 kit 0  . potassium chloride (K-DUR) 10 MEQ tablet Take 1 tablet (10 mEq total) by mouth daily. Needs office visit 30 tablet 0  . sacubitril-valsartan (ENTRESTO) 49-51 MG Take 1 tablet by mouth 2 (two) times daily. Needs office visit 60 tablet 0  . spironolactone (ALDACTONE) 25 MG tablet Take 1 tablet (25 mg total) by mouth daily. 30 tablet 0   No current facility-administered medications for this encounter.    Vitals:   04/05/17 1445  BP: 110/80  Pulse: 84  SpO2: 100%  Weight: 156 lb (70.8 kg)   Wt Readings from Last 3 Encounters:  04/05/17 156 lb (70.8 kg)  04/04/17 165 lb (74.8 kg)  05/16/16 166 lb 9.6 oz (75.6 kg)     PHYSICAL EXAM: General: Fatigued appearing. No resp difficulty. HEENT: normal Neck: supple. JVP 6-7. Carotids 2+ bilat; no bruits. No thyromegaly or nodule noted. Cor: PMI nondisplaced. RRR, 1/6 SEM RUSB  Lungs: CTAB, normal effort. Abdomen: soft, non-tender, distended, no HSM. No bruits or masses. +BS  Extremities: no cyanosis or clubbing. No appreciable erythema. L foot very tender to the touch. Not Hot. Trace edema at most.  Neuro: alert & orientedx3, cranial nerves grossly intact. moves all 4 extremities w/o difficulty. Affect pleasant   ASSESSMENT & PLAN:  1) Chronic systolic  HF:  Nonischemic cardiomyopathy, on echo in 2016 LV function remained back to normal => EF 55%. Most recent Echo 03/2016 with recurrent systolic dysfunction EF 20-25%  - Volume status looks stable on exam.  - Continue coreg 3.125 mg BID, digoxin 0.125 daily, spiro 25. Will send refills.  - Continue Entresto to 49/51 - Repeat Echo.  - Reinforced fluid restriction to < 2 L daily, sodium restriction to less than 2000 mg daily, and the importance of daily weights.   2) Dyspnea: CPX in 5/15 suggested primarily a pulmonary limitation.  She follows with Dr. Sherene Sires in Pulmonary. - Stable.  3)  HTN:  - Stable on current meds. Soft in ED last night. Will not-uptitrate.  4) Substance abuse - Still smoking. 3 cigarettes a day.   - Unfortunately she recently relapsed with cocaine use. No further. 5) Medical Non-compliance - Encouraged compliance.  6) Cellulitis - On ABX. Re-educated on how to complete course and encouraged to finish whole bottle.  Pt aware to seek additional care if develops fevers or chills.   Graciella Freer, PA-C 04/05/2017  Greater than 50% of the 25 minute visit was spent in counseling/coordination of care regarding disease state education, salt/fluid restriction, medication compliance, and tobacco abuse counseling.

## 2017-04-05 NOTE — Patient Instructions (Signed)
Your physician has requested that you have an echocardiogram. Echocardiography is a painless test that uses sound waves to create images of your heart. It provides your doctor with information about the size and shape of your heart and how well your heart's chambers and valves are working. This procedure takes approximately one hour. There are no restrictions for this procedure.  Your physician recommends that you schedule a follow-up appointment in: 4 weeks with Stacey Lin

## 2017-04-05 NOTE — Telephone Encounter (Signed)
Called to f/u with patient after ED notification from last night. States she required crutches but will be at her apt with CHF clinic this afternoon. Reminded to take all meds as prescribed before apt and to bring all meds with her to this apt.  Renee Pain, RN

## 2017-04-19 ENCOUNTER — Ambulatory Visit (HOSPITAL_COMMUNITY)
Admission: RE | Admit: 2017-04-19 | Discharge: 2017-04-19 | Disposition: A | Discharge status: Home / Self Care | Payer: Medicare Other | Source: Ambulatory Visit | Attending: Internal Medicine | Admitting: Internal Medicine

## 2017-04-19 DIAGNOSIS — I5022 Chronic systolic (congestive) heart failure: Secondary | ICD-10-CM | POA: Diagnosis not present

## 2017-04-19 DIAGNOSIS — J449 Chronic obstructive pulmonary disease, unspecified: Secondary | ICD-10-CM | POA: Diagnosis not present

## 2017-04-19 DIAGNOSIS — Z720 Tobacco use: Secondary | ICD-10-CM | POA: Diagnosis not present

## 2017-04-19 DIAGNOSIS — I083 Combined rheumatic disorders of mitral, aortic and tricuspid valves: Secondary | ICD-10-CM | POA: Diagnosis not present

## 2017-04-19 DIAGNOSIS — E785 Hyperlipidemia, unspecified: Secondary | ICD-10-CM | POA: Diagnosis not present

## 2017-04-19 DIAGNOSIS — J45909 Unspecified asthma, uncomplicated: Secondary | ICD-10-CM | POA: Insufficient documentation

## 2017-04-19 DIAGNOSIS — I110 Hypertensive heart disease with heart failure: Secondary | ICD-10-CM | POA: Insufficient documentation

## 2017-04-21 ENCOUNTER — Telehealth (HOSPITAL_COMMUNITY): Payer: Self-pay | Admitting: *Deleted

## 2017-04-21 NOTE — Telephone Encounter (Signed)
ECHOCARDIOGRAM COMPLETE  Order: [id number]  Status:  Final result Visible to patient:  No (Not Released) Dx:  Chronic systolic HF (heart failure) (...  Notes recorded by Kennieth Rad, RN on 04/21/2017 at 8:41 AM EDT Called and spoke with patient, she is aware and no further questions at this time. ------  Notes recorded by Shirley Friar, PA-C on 04/21/2017 at 7:20 AM EDT Please let her know her EF has recovered.    [name] [address]" [place], PA-C 04/21/2017 7:20 AM

## 2017-04-24 ENCOUNTER — Other Ambulatory Visit (HOSPITAL_COMMUNITY): Payer: Self-pay | Admitting: Internal Medicine

## 2017-04-24 DIAGNOSIS — Z79891 Long term (current) use of opiate analgesic: Secondary | ICD-10-CM | POA: Diagnosis not present

## 2017-05-02 DIAGNOSIS — M1711 Unilateral primary osteoarthritis, right knee: Secondary | ICD-10-CM | POA: Diagnosis not present

## 2017-05-02 DIAGNOSIS — M25572 Pain in left ankle and joints of left foot: Secondary | ICD-10-CM | POA: Diagnosis not present

## 2017-05-03 ENCOUNTER — Ambulatory Visit (HOSPITAL_COMMUNITY)
Admission: RE | Admit: 2017-05-03 | Discharge: 2017-05-03 | Disposition: A | Discharge status: Home / Self Care | Payer: Medicare Other | Source: Ambulatory Visit | Attending: Internal Medicine | Admitting: Internal Medicine

## 2017-05-03 VITALS — BP 102/60 | HR 72 | Wt 160.2 lb

## 2017-05-03 DIAGNOSIS — I5042 Chronic combined systolic (congestive) and diastolic (congestive) heart failure: Secondary | ICD-10-CM | POA: Diagnosis not present

## 2017-05-03 DIAGNOSIS — I5022 Chronic systolic (congestive) heart failure: Secondary | ICD-10-CM | POA: Diagnosis not present

## 2017-05-03 DIAGNOSIS — R0600 Dyspnea, unspecified: Secondary | ICD-10-CM

## 2017-05-03 DIAGNOSIS — F17200 Nicotine dependence, unspecified, uncomplicated: Secondary | ICD-10-CM

## 2017-05-03 DIAGNOSIS — I340 Nonrheumatic mitral (valve) insufficiency: Secondary | ICD-10-CM | POA: Diagnosis not present

## 2017-05-03 DIAGNOSIS — Z79899 Other long term (current) drug therapy: Secondary | ICD-10-CM | POA: Insufficient documentation

## 2017-05-03 DIAGNOSIS — Z9119 Patient's noncompliance with other medical treatment and regimen: Secondary | ICD-10-CM | POA: Diagnosis not present

## 2017-05-03 DIAGNOSIS — J449 Chronic obstructive pulmonary disease, unspecified: Secondary | ICD-10-CM | POA: Insufficient documentation

## 2017-05-03 DIAGNOSIS — Z96651 Presence of right artificial knee joint: Secondary | ICD-10-CM | POA: Diagnosis not present

## 2017-05-03 DIAGNOSIS — I10 Essential (primary) hypertension: Secondary | ICD-10-CM

## 2017-05-03 DIAGNOSIS — I110 Hypertensive heart disease with heart failure: Secondary | ICD-10-CM | POA: Insufficient documentation

## 2017-05-03 DIAGNOSIS — D649 Anemia, unspecified: Secondary | ICD-10-CM | POA: Diagnosis not present

## 2017-05-03 DIAGNOSIS — F17210 Nicotine dependence, cigarettes, uncomplicated: Secondary | ICD-10-CM | POA: Diagnosis not present

## 2017-05-03 DIAGNOSIS — E7800 Pure hypercholesterolemia, unspecified: Secondary | ICD-10-CM | POA: Diagnosis not present

## 2017-05-03 DIAGNOSIS — I429 Cardiomyopathy, unspecified: Secondary | ICD-10-CM | POA: Insufficient documentation

## 2017-05-03 LAB — BASIC METABOLIC PANEL
Anion gap: 7 (ref 5–15)
BUN: 16 mg/dL (ref 6–20)
CHLORIDE: 104 mmol/L (ref 101–111)
CO2: 29 mmol/L (ref 22–32)
Calcium: 9.8 mg/dL (ref 8.9–10.3)
Creatinine, Ser: 1.04 mg/dL — ABNORMAL HIGH (ref 0.44–1.00)
GFR calc non Af Amer: >60 mL/min (ref >60)
Glucose, Bld: 194 mg/dL — ABNORMAL HIGH (ref 65–99)
POTASSIUM: 4.1 mmol/L (ref 3.5–5.1)
Sodium: 140 mmol/L (ref 135–145)

## 2017-05-03 NOTE — Patient Instructions (Signed)
Follow up in 4 months, we will contact you to schedule appointment.

## 2017-05-03 NOTE — Progress Notes (Signed)
Patient ID: Stacey Lin, female   DOB: [birth year], 51 y.o.   MRN: [id number]    Advanced Heart Failure Clinic Note   PCP: [facility] Pulmonologist: Dr. Shelle Iron HF: Dr. Gala Romney   HPI: Stacey Lin is a 51 yo female with a history of anemia, combined systolic/diastolic heart failure, HTN, and history of polysubstance abuse. Initial ECHO (04/2010) EF 50-55% with grd 1 DD, LVIDd 47mm, mild to mod MR, RA and LA mildly dilated.   Admitted to the hospital 08/19/13-08/29/13 with A/C systolic HF. Discharge weight 144 lbs.  Seen in ED 04/04/17 for cellulitis. Started on clindamycin.   She presets today for follow up. Weight up 4 lbs from last visit. Feeling good. Finished ABX for cellulitis. Needs a knee replacement (R knee). Mild SOB with a few flights of steps (Living with son who has 3rd story apartment). Occasional lightheadedness with rapid standing. Not marked or limiting. Taking all medications as directed. Watching salt and fluid. Did smoke crack about 2 weeks ago in a relapse, but says she has eliminated that person from her life and has remained clean.   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 Mod MR and Mod-sev TR Echo (1/15): EF 45%, mild to moderate AI, moderate MR, RV normal size with mildly decreased systolic function.  CPX (5/15): FVC 66%, FEV1 59%, ratio 72%, peak VO2 15.7, slope 22.6 => mildly decreased functional capacity limited primarily by lungs.  Echo (05/2014): EF 55-60%, mild/mod AI, grade I DD and mild MR R/LHC (06/06/14): ectatic coronaries (suspect d/t HTN) no significant arthrosclerosis and normal EF. No significant MR noted Echo (7/16): EF 60-65%, moderate AI.  Echo (11/16): EF 55%, Mild LAE Echo 6/17: EF 20-25% severe central MR  Labs (1/15): K 4.1,  creatinine 0.96, hemoglobin 10.1 Labs (12/04/13) K 4.3 Creatinine 0.97 Labs (4/15) K+4.1, Creatinine 0.88 Labs (6/15) Hgb 10.9, K 3.8, creatinine 1.19 Labs (2/16) HCT 35.1, TSH normal Labs (5/16) K 3.3, creatinine 1.38 Labs (9/16): K 3.9, creatinine 1.24, HCT 38.3 Labs (10/16): K 3.9 Creatinine 0.98 TSH 1.846  Labs (11/16): K 3.8 , Creatinine 1.49  SH: Living [place] boyfriend. No ETOH or drug use. 2-3 cigs/day.   FH: Mother deceased: Dementia, seizures, stomach cancer        Father deceased: HTN  Review of systems complete and found to be negative unless listed in HPI.    Past Medical History:  Diagnosis Date  . Anemia   . Anginal pain (HCC)    2 months  . Arrhythmia   . Asthma   . CHF (congestive heart failure) (HCC)    1990s  . Chronic headaches   . COPD (chronic obstructive pulmonary disease) (HCC)    ??  . Hypercholesteremia   . Hypertension     Current Outpatient Prescriptions  Medication Sig Dispense Refill  . acetaminophen (TYLENOL) 500 MG tablet Take 1,500 mg by mouth every 6 (six) hours as needed for headache (pain).     . budesonide-formoterol (SYMBICORT) 80-4.5 MCG/ACT inhaler Take 2 puffs first thing in am and then another 2 puffs about 12 hours later. 1 Inhaler 11  . carvedilol (COREG) 3.125 MG tablet Take 1 tablet (3.125 mg total) by mouth 2 (two) times daily. 60 tablet 3  . carvedilol (COREG) 6.25 MG tablet Take 1  tablet (6.25 mg total) by mouth 2 (two) times daily. 60 tablet 3  . clindamycin (CLEOCIN) 150 MG capsule Take 1 capsule (150 mg total) by mouth every 6 (six) hours. 28 capsule 0  . digoxin (DIGITEK) 0.125 MG tablet Take 1 tablet (125 mcg total) by mouth daily. Needs office visit 30 tablet 3  . furosemide (LASIX) 40 MG tablet Take 1 tablet (40 mg total) by mouth daily. May take additional tab in PM as needed. Needs office visit 60 tablet 3  . HYDROcodone-acetaminophen (NORCO) 7.5-325 MG tablet Take 1 tablet by mouth every 6 (six) hours as needed  for moderate pain.    Marland Kitchen HYDROcodone-acetaminophen (NORCO/VICODIN) 5-325 MG tablet Take 2 tablets by mouth every 4 (four) hours as needed. 10 tablet 0  . peg 3350 powder (MOVIPREP) 100 g SOLR Take 1 kit (200 g total) by mouth as directed. 1 kit 0  . potassium chloride (K-DUR) 10 MEQ tablet Take 1 tablet (10 mEq total) by mouth daily. Needs office visit 30 tablet 3  . sacubitril-valsartan (ENTRESTO) 49-51 MG Take 1 tablet by mouth 2 (two) times daily. Needs office visit 60 tablet 3  . spironolactone (ALDACTONE) 25 MG tablet Take 1 tablet (25 mg total) by mouth daily. 30 tablet 3  . spironolactone (ALDACTONE) 25 MG tablet take 1 tablet by mouth once daily 30 tablet 3   No current facility-administered medications for this encounter.    Vitals:   05/03/17 1407  BP: 102/60  Pulse: 72  Weight: 160 lb 3.2 oz (72.7 kg)   Wt Readings from Last 3 Encounters:  05/03/17 160 lb 3.2 oz (72.7 kg)  04/05/17 156 lb (70.8 kg)  04/04/17 165 lb (74.8 kg)     PHYSICAL EXAM: General: Well appearing. No resp difficulty. HEENT: Normal Neck: Supple. JVP 6-7 Carotids 2+ bilat; no bruits. No thyromegaly or nodule noted. Cor: PMI nondisplaced. RRR, No M/G/R noted Lungs: CTAB, normal effort. Abdomen: Soft, non-tender, non-distended, no HSM. No bruits or masses. +BS  Extremities: No cyanosis, clubbing, rash, R and LLE no edema.  Neuro: Alert & orientedx3, cranial nerves grossly intact. moves all 4 extremities w/o difficulty. Affect pleasant   ASSESSMENT & PLAN:  1) Chronic systolic  HF with now normal EF:  Nonischemic cardiomyopathy, on echo in 2016 LV function remained back to normal => EF 55%. -> Echo 03/2016 with recurrent systolic dysfunction EF 20-25%  - With med compliance, Echo 04/19/2017 now with EF 60-65% range.  - Volume status stable on lasix 40 mg BID. Can take an extra as needed.  - Continue coreg 3.125 mg BID - Cont digoxin 0.125 daily - Cont spiro 25.  - Continue Entresto 49/51. BMET today.    - Reinforced fluid restriction to < 2 L daily, sodium restriction to less than 2000 mg daily, and the importance of daily weights.  2) Dyspnea: CPX in 5/15 suggested primarily a pulmonary limitation.  She follows with Dr. Sherene Sires in Pulmonary.  - Stable.  3) HTN:  - Stable on current meds. Will not up-titrate with normal EF now.  4) Substance abuse - Encouraged to stop completely. Smoking 1-2 cigarettes a day.  5) Medical Non-compliance - Encouraged compliance.   6) Cellulitis - Resolved.   Graciella Freer, PA-C 05/03/2017

## 2017-05-31 DIAGNOSIS — Z79891 Long term (current) use of opiate analgesic: Secondary | ICD-10-CM | POA: Diagnosis not present

## 2017-06-29 ENCOUNTER — Other Ambulatory Visit (HOSPITAL_COMMUNITY): Payer: Self-pay | Admitting: Internal Medicine

## 2017-07-03 DIAGNOSIS — Z79891 Long term (current) use of opiate analgesic: Secondary | ICD-10-CM | POA: Diagnosis not present

## 2017-08-03 DIAGNOSIS — Z79891 Long term (current) use of opiate analgesic: Secondary | ICD-10-CM | POA: Diagnosis not present

## 2017-08-14 ENCOUNTER — Other Ambulatory Visit (HOSPITAL_COMMUNITY): Payer: Self-pay | Admitting: Internal Medicine

## 2017-09-07 ENCOUNTER — Other Ambulatory Visit (HOSPITAL_COMMUNITY): Payer: Self-pay | Admitting: *Deleted

## 2017-09-07 MED ORDER — SPIRONOLACTONE 25 MG PO TABS: 25.0000 mg | ORAL_TABLET | Freq: Every day | ORAL | 3 refills | Status: DC

## 2017-09-26 ENCOUNTER — Other Ambulatory Visit (HOSPITAL_COMMUNITY): Payer: Self-pay | Admitting: *Deleted

## 2017-09-26 MED ORDER — CARVEDILOL 3.125 MG PO TABS: 3.1250 mg | ORAL_TABLET | Freq: Two times a day (BID) | ORAL | 3 refills | Status: DC

## 2017-10-09 ENCOUNTER — Other Ambulatory Visit (HOSPITAL_COMMUNITY): Payer: Self-pay | Admitting: *Deleted

## 2017-10-09 MED ORDER — DIGOXIN 125 MCG PO TABS: 125.0000 ug | ORAL_TABLET | Freq: Every day | ORAL | 3 refills | Status: DC

## 2017-10-09 MED ORDER — POTASSIUM CHLORIDE ER 10 MEQ PO TBCR: 10.0000 meq | EXTENDED_RELEASE_TABLET | Freq: Every day | ORAL | 2 refills | Status: DC

## 2017-11-06 DIAGNOSIS — Z79891 Long term (current) use of opiate analgesic: Secondary | ICD-10-CM | POA: Diagnosis not present

## 2017-11-13 ENCOUNTER — Other Ambulatory Visit (HOSPITAL_COMMUNITY): Payer: Self-pay | Admitting: *Deleted

## 2017-11-13 MED ORDER — SACUBITRIL-VALSARTAN 49-51 MG PO TABS: 1.0000 | ORAL_TABLET | Freq: Two times a day (BID) | ORAL | 2 refills | Status: DC

## 2017-12-18 ENCOUNTER — Telehealth: Payer: Self-pay | Admitting: Licensed Clinical Social Worker

## 2017-12-18 NOTE — Telephone Encounter (Signed)
Patient called CSW to request assistance with obtaining a new PCP. Patient reports that her previous PCP "closed up". CSW provided a list of options and patient will follow up. CSW available if needed. Raquel Sarna, [place], [place]

## 2017-12-29 ENCOUNTER — Encounter (HOSPITAL_COMMUNITY): Payer: Medicare Other

## 2018-01-02 ENCOUNTER — Encounter (HOSPITAL_COMMUNITY): Payer: Self-pay

## 2018-01-02 ENCOUNTER — Ambulatory Visit (HOSPITAL_COMMUNITY)
Admission: RE | Admit: 2018-01-02 | Discharge: 2018-01-02 | Disposition: A | Discharge status: Home / Self Care | Payer: Medicare Other | Source: Ambulatory Visit | Attending: Cardiology | Admitting: Cardiology

## 2018-01-02 ENCOUNTER — Ambulatory Visit (INDEPENDENT_AMBULATORY_CARE_PROVIDER_SITE_OTHER): Payer: Medicare Other | Admitting: Physician Assistant

## 2018-01-02 ENCOUNTER — Other Ambulatory Visit: Payer: Self-pay

## 2018-01-02 ENCOUNTER — Encounter (INDEPENDENT_AMBULATORY_CARE_PROVIDER_SITE_OTHER): Payer: Self-pay | Admitting: Physician Assistant

## 2018-01-02 VITALS — BP 131/83 | HR 63 | Temp 98.1°F | Ht 61.0 in | Wt 163.6 lb

## 2018-01-02 VITALS — BP 112/80 | HR 78 | Wt 164.0 lb

## 2018-01-02 DIAGNOSIS — M25561 Pain in right knee: Secondary | ICD-10-CM | POA: Diagnosis not present

## 2018-01-02 DIAGNOSIS — I5022 Chronic systolic (congestive) heart failure: Secondary | ICD-10-CM

## 2018-01-02 DIAGNOSIS — E7800 Pure hypercholesterolemia, unspecified: Secondary | ICD-10-CM | POA: Diagnosis not present

## 2018-01-02 DIAGNOSIS — Z79899 Other long term (current) drug therapy: Secondary | ICD-10-CM

## 2018-01-02 DIAGNOSIS — R739 Hyperglycemia, unspecified: Secondary | ICD-10-CM

## 2018-01-02 DIAGNOSIS — F1910 Other psychoactive substance abuse, uncomplicated: Secondary | ICD-10-CM | POA: Diagnosis not present

## 2018-01-02 DIAGNOSIS — I428 Other cardiomyopathies: Secondary | ICD-10-CM | POA: Insufficient documentation

## 2018-01-02 DIAGNOSIS — D649 Anemia, unspecified: Secondary | ICD-10-CM | POA: Insufficient documentation

## 2018-01-02 DIAGNOSIS — J449 Chronic obstructive pulmonary disease, unspecified: Secondary | ICD-10-CM | POA: Diagnosis not present

## 2018-01-02 DIAGNOSIS — I110 Hypertensive heart disease with heart failure: Secondary | ICD-10-CM | POA: Diagnosis not present

## 2018-01-02 DIAGNOSIS — I10 Essential (primary) hypertension: Secondary | ICD-10-CM | POA: Diagnosis not present

## 2018-01-02 DIAGNOSIS — G8929 Other chronic pain: Secondary | ICD-10-CM | POA: Diagnosis not present

## 2018-01-02 DIAGNOSIS — E119 Type 2 diabetes mellitus without complications: Secondary | ICD-10-CM | POA: Diagnosis not present

## 2018-01-02 DIAGNOSIS — I5042 Chronic combined systolic (congestive) and diastolic (congestive) heart failure: Secondary | ICD-10-CM | POA: Diagnosis present

## 2018-01-02 DIAGNOSIS — R5383 Other fatigue: Secondary | ICD-10-CM | POA: Diagnosis not present

## 2018-01-02 DIAGNOSIS — F418 Other specified anxiety disorders: Secondary | ICD-10-CM

## 2018-01-02 LAB — BASIC METABOLIC PANEL
ANION GAP: 10 (ref 5–15)
BUN: 25 mg/dL — AB (ref 6–20)
CHLORIDE: 97 mmol/L — AB (ref 101–111)
CO2: 27 mmol/L (ref 22–32)
Calcium: 9.6 mg/dL (ref 8.9–10.3)
Creatinine, Ser: 1.24 mg/dL — ABNORMAL HIGH (ref 0.44–1.00)
GFR calc Af Amer: 56 mL/min — ABNORMAL LOW (ref >60)
GFR, EST NON AFRICAN AMERICAN: 49 mL/min — AB (ref >60)
GLUCOSE: 344 mg/dL — AB (ref 65–99)
Potassium: 3.7 mmol/L (ref 3.5–5.1)
Sodium: 134 mmol/L — ABNORMAL LOW (ref 135–145)

## 2018-01-02 LAB — POCT GLYCOSYLATED HEMOGLOBIN (HGB A1C): HEMOGLOBIN A1C: 8.8

## 2018-01-02 MED ORDER — FUROSEMIDE 40 MG PO TABS: 40.0000 mg | ORAL_TABLET | Freq: Every day | ORAL | 3 refills | Status: DC

## 2018-01-02 MED ORDER — BLOOD GLUCOSE MONITOR KIT: PACK | 0 refills | Status: DC

## 2018-01-02 MED ORDER — GLIMEPIRIDE 2 MG PO TABS: 2.0000 mg | ORAL_TABLET | Freq: Every day | ORAL | 5 refills | Status: DC

## 2018-01-02 MED ORDER — ESCITALOPRAM OXALATE 10 MG PO TABS: 10.0000 mg | ORAL_TABLET | Freq: Every day | ORAL | 2 refills | Status: DC

## 2018-01-02 MED ORDER — VITAMIN D-3 125 MCG (5000 UT) PO TABS: 1.0000 | ORAL_TABLET | Freq: Every day | ORAL | 0 refills | Status: DC

## 2018-01-02 MED ORDER — CLONAZEPAM 0.5 MG PO TABS: 0.5000 mg | ORAL_TABLET | Freq: Every day | ORAL | 0 refills | Status: DC

## 2018-01-02 NOTE — Patient Instructions (Addendum)
Routine lab work today. Will notify you of abnormal results, otherwise no news is good news!  STOP Potassium.  STOP Digoxin.  DECREASE Lasix to 40 mg tablet ONCE daily.  Follow up 3 months with echocardiogram and appointment with Amy [name] NP-C.  Take all medication as prescribed the day of your appointment. Bring all medications with you to your appointment.  Do the following things EVERYDAY: 1) Weigh yourself in the morning before breakfast. Write it down and keep it in a log. 2) Take your medicines as prescribed 3) Eat low salt foods-Limit salt (sodium) to 2000 mg per day.  4) Stay as active as you can everyday 5) Limit all fluids for the day to less than 2 liters

## 2018-01-02 NOTE — Progress Notes (Signed)
CSW referred to assist patient with support and resources for substance use. Patient reports she has used crack cocaine on and off and last used 3 weeks ago. Patient was very tearful and stated she is now living with her brother who is a good support to her. She states she has become increasingly more depressed over the past 3 weeks as she is struggling not to use. She reports she has Medicare and Medicaid but unsure where to start for help. CSW and patient called [facility] for options/referrals. Patient provided with a list of options and patient states she will make the call as "I need to make the first step right". CSW provided supportive intervention and encouraged patient that she is making progress on the road to recovery. Patient very grateful for the assistance and support and states she will follow up with CSW after making appointment. Patient later called CSW to inform she made contact with a program and has an appointment on Friday for initial assessment. CSW continues to be available as needed. Raquel Sarna, [place], [place]

## 2018-01-02 NOTE — Patient Instructions (Addendum)
Community Resources  Advocacy/Legal Legal Aid [place]:  1-866-219-5262  /  336-272-0148  Family Justice Center:  336-641-7233  [facility] 24-hr Crisis line:  336-273-7273  Women's Resource Center, [place]:  336-275-6090  Court Watch (custody):  336-275-2346  [place] [facility]:   336-279-9299    Baby & Breastfeeding Car Seat Inspection @ Various [place] Fire Depts.- call 336-373-2177  [place] [facility]  336-832-6860  [facility] 336-878-6712  WIC: 336-641-3663 ([place]);  336-641-7571 ([place])  La Leche League:  1-877-452-5321   Childcare [place] [facility]: 336-369-5097 ([place]) / 336-887-8224 ([place])  - Child Care Resources/ Referrals/ Scholarships  - Head Start/ Early Head Start (call or apply online)  [place] DHHS: [place] Pre-K :  1-800-859-0829 / 336-274-5437   Employment / Job Search [facility] [place]: 336-275-6090 / [address]  [employer] ([employer]Link): 336-373-5922 ([place]) / 336-882-4141 ([place])  [facility]/ Career Center: 336-275-9801 / 336-282-7307  [facility] Job [facility] Career Center: 336-373-3764  DHHS Work First: 336-641-3447 ([place]) / 336-641-3447 ([place])  [facility] [place]:  336-676-5871   Financial Assistance [facility]:  336-553-2657  [facility]: 336-235-0368  [employer] (furniture):  336-370-4002  [place] [facility]: 336-373-4264  Low Income Energy Assistance  336-641-3000   Food Assistance DHHS- SNAP/ Food Stamps: 336-641-4588  WIC: [place]- 336-641-3663 ;  [place] 336-641-7571  Little Green Book- Free Meals  Little Blue Book- Free Food Pantries  During the summer, text "FOOD" to 877877   General Health / Clinics (Adults) Orange Card (for Adults) through [facility]: (336) 895-4900  [facility]:   336-832-8035  [facility]:   336-832-4444  Health Department:  336-641-3245  [facility]:  336-415-3877 / 336-641-2100  [facility] [place]:   336-373-0678  [facility]:   336-334-4822 x 50251   Housing [place] Housing Coalition:   336-691-9521  [place] [facility]:  336-275-8501  Affordable Housing Managemnt:  336-273-0568   Immigrant/ Refugee Center for [place] ([employer]CG):  336-256-1065  [facility]:  336-379-0037  [facility]:  336-937-4701  [employer] World Services:  336-617-0381  African Services Coalition:  336-574-2677   LGBTQ YouthSAFE  www.youthsafe[place].org  PFLAG  336-541-6754 / info@[place].org  The [facility] Project:  1-866-488-7386   Mental Health/ Substance Use [facility]  336-387-6161  [facility]:  336-832-9700 or 1-800-711-2635  [facility] Care:  336-271-5888  [facility]:  336-294-1349  [facility]:  336-542-2884  [facility] (walk-ins)  336-676-6840 / [address]  [place]:  800-449-1287  Alcoholics Anonymous:  336-854-4278  Narcotics Anonymous:  800-365-1036  Quit Smoking Hotline:  800-QUIT-NOW (800-784-8669)   Parenting Children's Home Society:  800-632-1400  [place]: Education Center & Support Groups:  336-832-6682  YWCA: 336-273-3461  [employer]: Bringing Out the Best:  336-334-3120               Thriving at Three (Hispanic families): 336-256-1066  Healthy Start ([facility]):  336-387-6161 x2288  Parents as Teachers:  336-691-0024  [place] [facility]- Learning Together (Immigrants): 336-369-5001   Poison Control 800-222-1222  Sports & Recreation [employer] Open Doors Application: ymcanwnc.org/join/open-doors-financial-assistance/  City of [place] Recreation Centers: http://www.[place].gov/index.aspx?page=3615   Special Needs Family Support Network:  336-832-6507  Autism Society of [place]:   336-333-0197 x1402 or x1412 /  800-785-1035  [facility] [place]:  336-334-5773     ARC of [employer]:  [place] (CDSA):  208-592-5132  [facility] (Care Coordination for Children):  508-171-0964   Transportation Medicaid Transportation: 418-266-1286 to apply  [place]: 214-507-7082 (reduced-fare bus ID to [place])  SCAT Paratransit services: Eligible riders only, call 976-734-1937 for application   Tutoring/Mentoring [place]: [place]: 772-647-0717 Letta Kocher)  [phone] ([place])  ACES through child's school: [place]: contact your local [place] [facility] Program: (747)595-2738      Diabetes Mellitus and Nutrition When you have diabetes (diabetes mellitus), it is very important to have healthy eating habits because your blood sugar (glucose) levels are greatly affected by what you eat and drink. Eating healthy foods in the appropriate amounts, at about the same times every day, can help you:  Control your blood glucose.  Lower your risk of heart disease.  Improve your blood pressure.  Reach or maintain a healthy weight.  Every person with diabetes is different, and each person has different needs for a meal plan. Your health care provider may recommend that you work with a diet and nutrition specialist (dietitian) to make a meal plan that is best for you. Your meal plan may vary depending on factors such as:  The calories you need.  The medicines you take.  Your weight.  Your blood glucose, blood pressure, and cholesterol levels.  Your activity level.  Other health conditions you have, such as heart or kidney disease.  How do carbohydrates affect me? Carbohydrates affect your blood glucose level more than any other type of food. Eating carbohydrates naturally increases the amount of glucose in your blood. Carbohydrate counting is a method for keeping track of how many carbohydrates you eat. Counting  carbohydrates is important to keep your blood glucose at a healthy level, especially if you use insulin or take certain oral diabetes medicines. It is important to know how many carbohydrates you can safely have in each meal. This is different for every person. Your dietitian can help you calculate how many carbohydrates you should have at each meal and for snack. Foods that contain carbohydrates include:  Bread, cereal, rice, pasta, and crackers.  Potatoes and corn.  Peas, beans, and lentils.  Milk and yogurt.  Fruit and juice.  Desserts, such as cakes, cookies, ice cream, and candy.  How does alcohol affect me? Alcohol can cause a sudden decrease in blood glucose (hypoglycemia), especially if you use insulin or take certain oral diabetes medicines. Hypoglycemia can be a life-threatening condition. Symptoms of hypoglycemia (sleepiness, dizziness, and confusion) are similar to symptoms of having too much alcohol. If your health care provider says that alcohol is safe for you, follow these guidelines:  Limit alcohol intake to no more than 1 drink per day for nonpregnant women and 2 drinks per day for men. One drink equals 12 oz of beer, 5 oz of wine, or 1 oz of hard liquor.  Do not drink on an empty stomach.  Keep yourself hydrated with water, diet soda, or unsweetened iced tea.  Keep in mind that regular soda, juice, and other mixers may contain a lot of sugar and must be counted as carbohydrates.  What are tips for following this plan? Reading food labels  Start by checking the serving size on the label. The amount of calories, carbohydrates, fats, and other nutrients listed on the label are based on one serving of the food. Many foods contain more than  one serving per package.  Check the total grams (g) of carbohydrates in one serving. You can calculate the number of servings of carbohydrates in one serving by dividing the total carbohydrates by 15. For example, if a food has 30 g  of total carbohydrates, it would be equal to 2 servings of carbohydrates.  Check the number of grams (g) of saturated and trans fats in one serving. Choose foods that have low or no amount of these fats.  Check the number of milligrams (mg) of sodium in one serving. Most people should limit total sodium intake to less than 2,300 mg per day.  Always check the nutrition information of foods labeled as "low-fat" or "nonfat". These foods may be higher in added sugar or refined carbohydrates and should be avoided.  Talk to your dietitian to identify your daily goals for nutrients listed on the label. Shopping  Avoid buying canned, premade, or processed foods. These foods tend to be high in fat, sodium, and added sugar.  Shop around the outside edge of the grocery store. This includes fresh fruits and vegetables, bulk grains, fresh meats, and fresh dairy. Cooking  Use low-heat cooking methods, such as baking, instead of high-heat cooking methods like deep frying.  Cook using healthy oils, such as olive, canola, or sunflower oil.  Avoid cooking with butter, cream, or high-fat meats. Meal planning  Eat meals and snacks regularly, preferably at the same times every day. Avoid going long periods of time without eating.  Eat foods high in fiber, such as fresh fruits, vegetables, beans, and whole grains. Talk to your dietitian about how many servings of carbohydrates you can eat at each meal.  Eat 4-6 ounces of lean protein each day, such as lean meat, chicken, fish, eggs, or tofu. 1 ounce is equal to 1 ounce of meat, chicken, or fish, 1 egg, or 1/4 cup of tofu.  Eat some foods each day that contain healthy fats, such as avocado, nuts, seeds, and fish. Lifestyle   Check your blood glucose regularly.  Exercise at least 30 minutes 5 or more days each week, or as told by your health care provider.  Take medicines as told by your health care provider.  Do not use any products that contain  nicotine or tobacco, such as cigarettes and e-cigarettes. If you need help quitting, ask your health care provider.  Work with a Social worker or diabetes educator to identify strategies to manage stress and any emotional and social challenges. What are some questions to ask my health care provider?  Do I need to meet with a diabetes educator?  Do I need to meet with a dietitian?  What number can I call if I have questions?  When are the best times to check my blood glucose? Where to find more information:  American Diabetes Association: diabetes.org/food-and-fitness/food  Academy of Nutrition and Dietetics: PokerClues.dk  [employer] of Diabetes and Digestive and Kidney Diseases (NIH): ContactWire.be Summary  A healthy meal plan will help you control your blood glucose and maintain a healthy lifestyle.  Working with a diet and nutrition specialist (dietitian) can help you make a meal plan that is best for you.  Keep in mind that carbohydrates and alcohol have immediate effects on your blood glucose levels. It is important to count carbohydrates and to use alcohol carefully. This information is not intended to replace advice given to you by your health care provider. Make sure you discuss any questions you have with your health care provider.  Document Released: 07/07/2005 Document Revised: 11/14/2016 Document Reviewed: 11/14/2016 Elsevier Interactive Patient Education  2018 [employer].     Major Depressive Disorder, Adult Major depressive disorder (MDD) is a mental health condition. MDD often makes you feel sad, hopeless, or helpless. MDD can also cause symptoms in your body. MDD can affect your:  Work.  School.  Relationships.  Other normal activities.  MDD can range from mild to very bad. It may occur once (single episode MDD). It can also  occur many times (recurrent MDD). The main symptoms of MDD often include:  Feeling sad, depressed, or irritable most of the time.  Loss of interest.  MDD symptoms also include:  Sleeping too much or too little.  Eating too much or too little.  A change in your weight.  Feeling tired (fatigue) or having low energy.  Feeling worthless.  Feeling guilty.  Trouble making decisions.  Trouble thinking clearly.  Thoughts of suicide or harming others.  Feeling weak.  Feeling agitated.  Keeping yourself from being around other people (isolation).  Follow these instructions at home: Activity  Do these things as told by your doctor: ? Go back to your normal activities. ? Exercise regularly. ? Spend time outdoors. Alcohol  Talk with your doctor about how alcohol can affect your antidepressant medicines.  Do not drink alcohol. Or, limit how much alcohol you drink. ? This means no more than 1 drink a day for nonpregnant women and 2 drinks a day for men. One drink equals one of these:  12 oz of beer.  5 oz of wine.  1 oz of hard liquor. General instructions  Take over-the-counter and prescription medicines only as told by your doctor.  Eat a healthy diet.  Get plenty of sleep.  Find activities that you enjoy. Make time to do them.  Think about joining a support group. Your doctor may be able to suggest a group for you.  Keep all follow-up visits as told by your doctor. This is important. Where to find more information:  [employer] on Mental Illness: ? www.nami.[place]: ? https://carter.com/  National Suicide Prevention Lifeline: ? (219) 695-2689. This is free, 24-hour help. Contact a doctor if:  Your symptoms get worse.  You have new symptoms. Get help right away if:  You self-harm.  You see, hear, taste, smell, or feel things that are not present (hallucinate). If you ever feel like you may hurt yourself  or others, or have thoughts about taking your own life, get help right away. You can go to your nearest emergency department or call:  Your local emergency services (911 in the [place]  A suicide crisis helpline, such as the National Suicide Prevention Lifeline: ? 782 778 8971. This is open 24 hours a day.  This information is not intended to replace advice given to you by your health care provider. Make sure you discuss any questions you have with your health care provider. Document Released: 09/21/2015 Document Revised: 06/26/2016 Document Reviewed: 06/26/2016 Elsevier Interactive Patient Education  2017 [employer].

## 2018-01-02 NOTE — Progress Notes (Signed)
Subjective:  Patient ID: Stacey Lin, female    DOB: 1966/03/13  Age: 52 y.o. MRN: [id number]  CC: depression  HPI Lachonda Handlin is a 52 y.o. female with a medical history of HTN, HLD, CHF, CKD3, Asthma, COPD, Crack/Cocaine use, and anemia presents as a new patient with complaint of depression. Many social, financial, and addiction struggles. She made an appointment to [facility] this Friday with the help of her CSW. PHQ9 27 and GAD7 17 today. Has not yet been prescribed any anti-depressants.     Reports chronic right knee and right shoulder pain that was managed by her past PCP, Dr. Ronne Binning and her orthopedic specialist. Says ortho told her she needs a knee replacement.  Has not scheduled knee replacement out of hesitation/fear of procedure.    Also complains of fatigue, polydipsia, polyuria, and visual blurring. BMP this morning 344 mg/dL.       Outpatient Medications Prior to Visit  Medication Sig Dispense Refill  . acetaminophen (TYLENOL) 500 MG tablet Take 1,500 mg by mouth every 6 (six) hours as needed for headache (pain).     . budesonide-formoterol (SYMBICORT) 80-4.5 MCG/ACT inhaler Take 2 puffs first thing in am and then another 2 puffs about 12 hours later. 1 Inhaler 11  . carvedilol (COREG) 3.125 MG tablet Take 1 tablet (3.125 mg total) by mouth 2 (two) times daily. 60 tablet 3  . furosemide (LASIX) 40 MG tablet Take 1 tablet (40 mg total) by mouth daily. 90 tablet 3  . sacubitril-valsartan (ENTRESTO) 49-51 MG Take 1 tablet by mouth 2 (two) times daily. 60 tablet 2  . spironolactone (ALDACTONE) 25 MG tablet Take 1 tablet (25 mg total) daily by mouth. 30 tablet 3  . clindamycin (CLEOCIN) 150 MG capsule Take 1 capsule (150 mg total) by mouth every 6 (six) hours. (Patient not taking: Reported on 01/02/2018) 28 capsule 0  . HYDROcodone-acetaminophen (NORCO) 7.5-325 MG tablet Take 1 tablet by mouth every 6 (six) hours as needed for moderate pain.    Marland Kitchen HYDROcodone-acetaminophen  (NORCO/VICODIN) 5-325 MG tablet Take 2 tablets by mouth every 4 (four) hours as needed. (Patient not taking: Reported on 01/02/2018) 10 tablet 0  . peg 3350 powder (MOVIPREP) 100 g SOLR Take 1 kit (200 g total) by mouth as directed. (Patient not taking: Reported on 01/02/2018) 1 kit 0   No facility-administered medications prior to visit.      ROS Review of Systems  Constitutional: Negative for chills, fever and malaise/fatigue.  Eyes: Negative for blurred vision.  Respiratory: Negative for shortness of breath.   Cardiovascular: Negative for chest pain and palpitations.  Gastrointestinal: Negative for abdominal pain and nausea.  Genitourinary: Negative for dysuria and hematuria.  Musculoskeletal: Positive for joint pain. Negative for myalgias.  Skin: Negative for rash.  Neurological: Negative for tingling and headaches.  Psychiatric/Behavioral: Negative for depression. The patient is not nervous/anxious.     Objective:  BP 131/83 (BP Location: Left Arm, Patient Position: Sitting, Cuff Size: Normal)   Pulse 63   Temp 98.1 F (36.7 C) (Oral)   Ht 5\' 1"  (1.549 m)   Wt 163 lb 9.6 oz (74.2 kg)   LMP  (LMP Unknown)   SpO2 96%   BMI 30.91 kg/m   BP/Weight 01/02/2018 01/02/2018 05/03/2017  Systolic BP 112 131 102  Diastolic BP 80 83 60  Wt. (Lbs) 164 163.6 160.2  BMI 32.03 30.91 31.29      Physical Exam  Constitutional: She is oriented to person, place, and  time.  Well developed, well nourished, NAD, polite  HENT:  Head: Normocephalic and atraumatic.  Eyes: No scleral icterus.  Neck: Normal range of motion. Neck supple. No thyromegaly present.  Cardiovascular: Normal rate, regular rhythm and normal heart sounds.  Pulmonary/Chest: Effort normal and breath sounds normal.  Musculoskeletal: She exhibits no edema.  Mild generalized TTP of the right knee.  Neurological: She is alert and oriented to person, place, and time. No cranial nerve deficit. Coordination normal.  Skin: Skin  is warm and dry. No rash noted. No erythema. No pallor.  Psychiatric: Her behavior is normal. Thought content normal.  Depressed, tearful, constricted affect.  Vitals reviewed.    Assessment & Plan:    1. Depression with anxiety - escitalopram (LEXAPRO) 10 MG tablet; Take 1 tablet (10 mg total) by mouth daily.  Dispense: 30 tablet; Refill: 2 - clonazePAM (KLONOPIN) 0.5 MG tablet; Take 1 tablet (0.5 mg total) by mouth at bedtime.  Dispense: 14 tablet; Refill: 0  2. Hyperglycemia - HgB A1c 8.8%  3. Hypertension, unspecified type - Lipid panel; Future - Hepatic Function Panel; Future  4. Fatigue, unspecified type - Cholecalciferol (VITAMIN D-3) 5000 units TABS; Take 1 tablet by mouth daily.  Dispense: 30 tablet; Refill: 0  5. Chronic pain of right knee - I had patient sign Medical Information Release form to obtain notes from PCP and Ortho.  6. High risk medication use - Drug Screen, Urine  7. Type 2 diabetes mellitus without complication, without long-term current use of insulin (HCC) - Referral to Nutrition and Diabetes Services - blood glucose meter kit and supplies KIT; Dispense based on patient and insurance preference. Use up to four times daily as directed. (FOR ICD-9 250.00, 250.01).  Dispense: 1 each; Refill: 0 - Begin glimepiride 2 mg, one tablet, po qday.   Meds ordered this encounter  Medications  . escitalopram (LEXAPRO) 10 MG tablet    Sig: Take 1 tablet (10 mg total) by mouth daily.    Dispense:  30 tablet    Refill:  2    Order Specific Question:   Supervising Provider    Answer:   Quentin Angst [id number]  . clonazePAM (KLONOPIN) 0.5 MG tablet    Sig: Take 1 tablet (0.5 mg total) by mouth at bedtime.    Dispense:  14 tablet    Refill:  0    Order Specific Question:   Supervising Provider    Answer:   Quentin Angst [id number]  . Cholecalciferol (VITAMIN D-3) 5000 units TABS    Sig: Take 1 tablet by mouth daily.    Dispense:  30 tablet     Refill:  0    Order Specific Question:   Supervising Provider    Answer:   Quentin Angst [id number]  . glimepiride (AMARYL) 2 MG tablet    Sig: Take 1 tablet (2 mg total) by mouth daily before breakfast.    Dispense:  30 tablet    Refill:  5    Order Specific Question:   Supervising Provider    Answer:   Quentin Angst [id number]  . blood glucose meter kit and supplies KIT    Sig: Dispense based on patient and insurance preference. Use up to four times daily as directed. (FOR ICD-9 250.00, 250.01).    Dispense:  1 each    Refill:  0    Order Specific Question:   Number of strips    Answer:   100    Order  Specific Question:   Number of lancets    Answer:   100    Order Specific Question:   Supervising Provider    Answer:   Quentin Angst [[id number]]    Follow-up: Return in about 4 weeks (around 01/30/2018) for depression with anxiety.   Loletta Specter PA

## 2018-01-02 NOTE — Progress Notes (Signed)
Patient ID: Stacey Lin, female   DOB: 17-Feb-1966, 52 y.o.   MRN: [id number]    Advanced Heart Failure Clinic Note   PCP: [facility] Pulmonologist: Dr. Gwenette Greet HF: Dr. Haroldine Laws   HPI: Stacey Lin is a 52 yo female with a history of anemia, combined systolic/diastolic heart failure, HTN, and history of polysubstance abuse. Initial ECHO (04/2010) EF 50-55% with grd 1 DD, LVIDd 60m, mild to mod MR, RA and LA mildly dilated.   Admitted to the hospital 161/60/73-71/0/62with A/C systolic HF. Discharge weight 144 lbs.  Today she returns for HF follow up. Complaining of fatigue and feeling depressed. She has follow up with PCP today. Denies SOB/PND/Orthopnea. Last used cocaine 3 weeks ago.Only smokes when he uses cocaine.  Usually takes all medications.   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 Mod MR and Mod-sev TR Echo (1/15): EF 45%, mild to moderate AI, moderate MR, RV normal size with mildly decreased systolic function.  CPX (5/15): FVC 66%, FEV1 59%, ratio 72%, peak VO2 15.7, slope 22.6 => mildly decreased functional capacity limited primarily by lungs.  Echo (05/2014): EF 55-60%, mild/mod AI, grade I DD and mild MR R/LHC (06/06/14): ectatic coronaries (suspect d/t HTN) no significant arthrosclerosis and normal EF. No significant MR noted Echo (7/16): EF 60-65%, moderate AI.  Echo (11/16): EF 55%, Mild LAE Echo 6/17: EF 20-25% severe central MR ECHO 03/2017: EF 6065% Grade IDD Mild MR  Labs (1/15): K 4.1, creatinine 0.96, hemoglobin 10.1 Labs (12/04/13) K 4.3 Creatinine 0.97 Labs (4/15) K+4.1, Creatinine 0.88 Labs (6/15) Hgb 10.9, K 3.8, creatinine 1.19 Labs (2/16) HCT 35.1, TSH normal Labs (5/16) K 3.3, creatinine 1.38 Labs (9/16): K 3.9, creatinine 1.24, HCT  38.3 Labs (10/16): K 3.9 Creatinine 0.98 TSH 1.846  Labs (11/16): K 3.8 , Creatinine 1.49 Labs (04/2017): K 4.1 Creatinine 1.04   SH: Living in [place] boyfriend. No ETOH or drug use. 2-3 cigs/day.   FH: Mother deceased: Dementia, seizures, stomach cancer        Father deceased: HTN  Review of systems complete and found to be negative unless listed in HPI.    Past Medical History:  Diagnosis Date  . Anemia   . Anginal pain ([place]    2 months  . Arrhythmia   . Asthma   . CHF ([place]estive heart failure) ([place]    1990s  . Chronic headaches   . COPD (chronic obstructive pulmonary disease) ([facility]    ??  . Hypercholesteremia   . Hypertension     Current Outpatient Medications  Medication Sig Dispense Refill  . acetaminophen (TYLENOL) 500 MG tablet Take 1,500 mg by mouth every 6 (six) hours as needed for headache (pain).     . budesonide-formoterol (SYMBICORT) 80-4.5 MCG/ACT inhaler Take 2 puffs first thing in am and then another 2 puffs about 12 hours later. 1 Inhaler 11  . carvedilol (COREG) 3.125 MG tablet Take 1 tablet (3.125 mg total) by mouth 2 (two) times daily. 60 tablet 3  . clindamycin (CLEOCIN) 150 MG capsule Take 1 capsule (150 mg total) by mouth every 6 (six) hours. 28 capsule 0  . digoxin (DIGITEK) 0.125 MG tablet Take 1 tablet (125 mcg total) by mouth daily. Needs office visit 30 tablet 3  . digoxin (DIGOX) 0.125 MG  Patient ID: Stacey Lin, female   DOB: 17-Feb-1966, 52 y.o.   MRN: [id number]    Advanced Heart Failure Clinic Note   PCP: [facility] Pulmonologist: Dr. Gwenette Greet HF: Dr. Haroldine Laws   HPI: Stacey Lin is a 52 yo female with a history of anemia, combined systolic/diastolic heart failure, HTN, and history of polysubstance abuse. Initial ECHO (04/2010) EF 50-55% with grd 1 DD, LVIDd 60m, mild to mod MR, RA and LA mildly dilated.   Admitted to the hospital 161/60/73-71/0/62with A/C systolic HF. Discharge weight 144 lbs.  Today she returns for HF follow up. Complaining of fatigue and feeling depressed. She has follow up with PCP today. Denies SOB/PND/Orthopnea. Last used cocaine 3 weeks ago.Only smokes when he uses cocaine.  Usually takes all medications.   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 Mod MR and Mod-sev TR Echo (1/15): EF 45%, mild to moderate AI, moderate MR, RV normal size with mildly decreased systolic function.  CPX (5/15): FVC 66%, FEV1 59%, ratio 72%, peak VO2 15.7, slope 22.6 => mildly decreased functional capacity limited primarily by lungs.  Echo (05/2014): EF 55-60%, mild/mod AI, grade I DD and mild MR R/LHC (06/06/14): ectatic coronaries (suspect d/t HTN) no significant arthrosclerosis and normal EF. No significant MR noted Echo (7/16): EF 60-65%, moderate AI.  Echo (11/16): EF 55%, Mild LAE Echo 6/17: EF 20-25% severe central MR ECHO 03/2017: EF 6065% Grade IDD Mild MR  Labs (1/15): K 4.1, creatinine 0.96, hemoglobin 10.1 Labs (12/04/13) K 4.3 Creatinine 0.97 Labs (4/15) K+4.1, Creatinine 0.88 Labs (6/15) Hgb 10.9, K 3.8, creatinine 1.19 Labs (2/16) HCT 35.1, TSH normal Labs (5/16) K 3.3, creatinine 1.38 Labs (9/16): K 3.9, creatinine 1.24, HCT  38.3 Labs (10/16): K 3.9 Creatinine 0.98 TSH 1.846  Labs (11/16): K 3.8 , Creatinine 1.49 Labs (04/2017): K 4.1 Creatinine 1.04   SH: Living in [place] boyfriend. No ETOH or drug use. 2-3 cigs/day.   FH: Mother deceased: Dementia, seizures, stomach cancer        Father deceased: HTN  Review of systems complete and found to be negative unless listed in HPI.    Past Medical History:  Diagnosis Date  . Anemia   . Anginal pain ([facility]    2 months  . Arrhythmia   . Asthma   . CHF (congestive heart failure) ([facility]    1990s  . Chronic headaches   . COPD (chronic obstructive pulmonary disease) ([facility]    ??  . Hypercholesteremia   . Hypertension     Current Outpatient Medications  Medication Sig Dispense Refill  . acetaminophen (TYLENOL) 500 MG tablet Take 1,500 mg by mouth every 6 (six) hours as needed for headache (pain).     . budesonide-formoterol (SYMBICORT) 80-4.5 MCG/ACT inhaler Take 2 puffs first thing in am and then another 2 puffs about 12 hours later. 1 Inhaler 11  . carvedilol (COREG) 3.125 MG tablet Take 1 tablet (3.125 mg total) by mouth 2 (two) times daily. 60 tablet 3  . clindamycin (CLEOCIN) 150 MG capsule Take 1 capsule (150 mg total) by mouth every 6 (six) hours. 28 capsule 0  . digoxin (DIGITEK) 0.125 MG tablet Take 1 tablet (125 mcg total) by mouth daily. Needs office visit 30 tablet 3  . digoxin (DIGOX) 0.125 MG

## 2018-01-03 ENCOUNTER — Telehealth: Payer: Self-pay | Admitting: Licensed Clinical Social Worker

## 2018-01-03 LAB — DRUG SCREEN, URINE
AMPHETAMINES, URINE: NEGATIVE ng/mL
Barbiturate screen, urine: NEGATIVE ng/mL
Benzodiazepine Quant, Ur: NEGATIVE ng/mL
Cannabinoid Quant, Ur: NEGATIVE ng/mL
Cocaine (Metab.): NEGATIVE ng/mL
Opiate Quant, Ur: NEGATIVE ng/mL
PCP QUANT UR: NEGATIVE ng/mL

## 2018-01-03 NOTE — Telephone Encounter (Signed)
CSW contacted patient this morning to follow up on yesterday's visit. Patient reports she is doing much better and grateful for referral to PCP. Patient states she has a new medication for depression and already feeling better. Patient appears to motivated for improved health. Patient has appointment for substance abuse program on Friday. Patient grateful for support form HF team and will follow up as needed. CSW available as needed. Stacey Lin, [place], [place]

## 2018-01-04 ENCOUNTER — Telehealth (INDEPENDENT_AMBULATORY_CARE_PROVIDER_SITE_OTHER): Payer: Self-pay | Admitting: Physician Assistant

## 2018-01-04 NOTE — Telephone Encounter (Signed)
Patient called to talk to [place] regarding her Glucometer machine  and the sleep medicine is not working she was up all night . Please, call her [phone]

## 2018-01-05 DIAGNOSIS — F331 Major depressive disorder, recurrent, moderate: Secondary | ICD-10-CM | POA: Diagnosis not present

## 2018-01-05 NOTE — Telephone Encounter (Signed)
Spoke with patient she wanted to know where she could take her Rx for glucometer.

## 2018-01-05 NOTE — Telephone Encounter (Signed)
Called patient to get more information on what she needed and her voicemail box is not set up yet. Will call back

## 2018-01-08 ENCOUNTER — Encounter: Payer: Medicare Other | Attending: Physician Assistant | Admitting: Registered"

## 2018-01-08 ENCOUNTER — Encounter: Payer: Self-pay | Admitting: Licensed Clinical Social Worker

## 2018-01-08 ENCOUNTER — Other Ambulatory Visit (INDEPENDENT_AMBULATORY_CARE_PROVIDER_SITE_OTHER): Payer: Self-pay | Admitting: Physician Assistant

## 2018-01-08 DIAGNOSIS — E119 Type 2 diabetes mellitus without complications: Secondary | ICD-10-CM | POA: Insufficient documentation

## 2018-01-08 DIAGNOSIS — Z713 Dietary counseling and surveillance: Secondary | ICD-10-CM | POA: Diagnosis not present

## 2018-01-08 MED ORDER — ACCU-CHEK SOFT TOUCH LANCETS MISC: 12 refills | Status: DC

## 2018-01-08 MED ORDER — ACCU-CHEK AVIVA PLUS W/DEVICE KIT: 1.0000 | PACK | Freq: Three times a day (TID) | 0 refills | Status: DC

## 2018-01-08 MED ORDER — GLUCOSE BLOOD VI STRP: ORAL_STRIP | 12 refills | Status: DC

## 2018-01-08 NOTE — Patient Instructions (Signed)
Great job on cutting out the soda! Aim to eat balanced meals and snacks, whenever having carbohydrate also include a protein. Consider checking your blood sugar 2 times per day until your blood sugar numbers are in target range. Take your meter with you to your doctors appointment.

## 2018-01-08 NOTE — Progress Notes (Signed)
Diabetes Self-Management Education  Visit Type: First/Initial  Appt. Start Time: 1430 Appt. End Time: 5093  01/09/2018  [name], identified by name and date of birth, is a 52 y.o. female with a diagnosis of Diabetes: Type 2.   ASSESSMENT Patient was diagnosed with T2DM a week ago and has been afraid. Patient was in tears explaining what she has been going through today just trying to get a glucometer and having to use the bus for transportation to doctor's office and the pharmacy and here. Patient has started on antidepressants and states she is aware it takes some time to start working.  Patient states she has been clean 4 weeks today from her drug addiction and appears to be happy and proud of her accomplishment.  Patient states she has made changes to her diet and reports now she is drinking mostly water, but was drinking a lot of soda. Patient states she has heard of carb counting but she doesn't understand it. RD taught simplified balancing meals and snacks, which patient was able to demonstrate understanding with teach back.  Patient state she lives with her brother who cooks but he adds a lot of sugar to food. Patient states she will start getting SNAP benefits next month. Patient states she is aware of local food pantries and meals offered at church.  RD provided glucometer and instructions Accu-chek Guide Lot #: [id number] Exp: 01-11-2019 BG: 120 (before a meal)  Diabetes Self-Management Education - 01/08/18 1439      Visit Information   Visit Type  First/Initial      Initial Visit   Diabetes Type  Type 2    Are you currently following a meal plan?  No    Are you taking your medications as prescribed?  Yes    Date Diagnosed  last [place]   How would you rate your overall health?  Fair      Psychosocial Assessment   Patient Belief/Attitude about Diabetes  Afraid    How often do you need to have someone help you when you read instructions,  pamphlets, or other written materials from your doctor or pharmacy?  3 - Sometimes    What is the last grade level you completed in school?  grad      Complications   Last HgB A1C per patient/outside source  8.8 %    How often do you check your blood sugar?  0 times/day (not testing)    Have you had a dilated eye exam in the past 12 months?  No    Have you had a dental exam in the past 12 months?  No has appointment    Are you checking your feet?  No      Dietary Intake   Breakfast  none    Lunch  chicken, bread with hotsauce OR oodles of noodles,     Snack (afternoon)  nabs OR pickle    Dinner  salad, pizza, diet coke    Snack (evening)  chicken, bread & hot sauce.    Beverage(s)  water      Exercise   Exercise Type  Light (walking / raking leaves)    How many days per week to you exercise?  4    How many minutes per day do you exercise?  15    Total minutes per week of exercise  60      Patient Education   Previous Diabetes Education  No  Nutrition management   Role of diet in the treatment of diabetes and the relationship between the three main macronutrients and blood glucose level    Monitoring  Taught/evaluated SMBG meter.;Identified appropriate SMBG and/or A1C goals.    Psychosocial adjustment  Worked with patient to identify barriers to care and solutions      Individualized Goals (developed by patient)   Nutrition  General guidelines for healthy choices and portions discussed    Monitoring   test my blood glucose as discussed      Outcomes   Expected Outcomes  Demonstrated interest in learning. Expect positive outcomes    Future DMSE  4-6 wks    Program Status  Not Completed     Individualized Plan for Diabetes Self-Management Training:   Learning Objective:  Patient will have a greater understanding of diabetes self-management. Patient education plan is to attend individual and/or group sessions per assessed needs and concerns.   Patient Instructions  Doristine Devoid  job on cutting out the soda! Aim to eat balanced meals and snacks, whenever having carbohydrate also include a protein. Consider checking your blood sugar 2 times per day until your blood sugar numbers are in target range. Take your meter with you to your doctors appointment.  Expected Outcomes:  Demonstrated interest in learning. Expect positive outcomes  Education material provided: A1C conversion sheet, My Plate and Snack sheet  If problems or questions, patient to contact team via:  Phone  Future DSME appointment: 4-6 wks

## 2018-01-08 NOTE — Progress Notes (Signed)
Patient came by the clinic to discuss recent referral to [facility] program and obtain some transportation assistance for multiple follow up appointments. Patient reports she went to [place] program on Friday and it was a very positive experience. She will return next week and continue with the program. Patient states she is having difficulties with obtaining her glucometer and test strips. CSW followed up and was informed that patient will need to go to another pharmacy where they have the ability to file under Medicare A&B. Patient verbalizes understanding and will go to [facility] where the billing for the supplies can fall under her Medicare A&B and Medicaid. Patient reports she has an appointment this afternoon to get instruction on the glucometer. Patient provided bus passes to get to and from appointments/pharmacy. CSW continues to be available as needed. Raquel Sarna, [place], [place]

## 2018-01-08 NOTE — Progress Notes (Signed)
New orders written for Accu-check Aviva Plus meter, Accu-check Aviva Plus strips, and Accu-check lancets.

## 2018-01-09 ENCOUNTER — Other Ambulatory Visit (INDEPENDENT_AMBULATORY_CARE_PROVIDER_SITE_OTHER): Payer: Medicare Other

## 2018-01-09 ENCOUNTER — Other Ambulatory Visit (INDEPENDENT_AMBULATORY_CARE_PROVIDER_SITE_OTHER): Payer: Self-pay | Admitting: Physician Assistant

## 2018-01-09 DIAGNOSIS — I10 Essential (primary) hypertension: Secondary | ICD-10-CM | POA: Diagnosis not present

## 2018-01-09 DIAGNOSIS — E119 Type 2 diabetes mellitus without complications: Secondary | ICD-10-CM

## 2018-01-09 MED ORDER — ACCU-CHEK AVIVA PLUS W/DEVICE KIT: 1.0000 | PACK | Freq: Three times a day (TID) | 0 refills | Status: DC

## 2018-01-09 MED ORDER — ACCU-CHEK SOFT TOUCH LANCETS MISC: 12 refills | Status: DC

## 2018-01-09 MED ORDER — BLOOD GLUCOSE MONITOR KIT: PACK | 0 refills | Status: DC

## 2018-01-09 NOTE — Progress Notes (Signed)
Lab draw

## 2018-01-10 ENCOUNTER — Telehealth (INDEPENDENT_AMBULATORY_CARE_PROVIDER_SITE_OTHER): Payer: Self-pay

## 2018-01-10 ENCOUNTER — Other Ambulatory Visit (INDEPENDENT_AMBULATORY_CARE_PROVIDER_SITE_OTHER): Payer: Self-pay | Admitting: Physician Assistant

## 2018-01-10 DIAGNOSIS — E7841 Elevated Lipoprotein(a): Secondary | ICD-10-CM

## 2018-01-10 LAB — LIPID PANEL
CHOLESTEROL TOTAL: 218 mg/dL — AB (ref 100–199)
Chol/HDL Ratio: 5.3 ratio — ABNORMAL HIGH (ref 0.0–4.4)
HDL: 41 mg/dL (ref >39)
LDL Calculated: 152 mg/dL — ABNORMAL HIGH (ref 0–99)
Triglycerides: 123 mg/dL (ref 0–149)
VLDL Cholesterol Cal: 25 mg/dL (ref 5–40)

## 2018-01-10 LAB — HEPATIC FUNCTION PANEL
ALK PHOS: 130 IU/L — AB (ref 39–117)
ALT: 15 IU/L (ref 0–32)
AST: 15 IU/L (ref 0–40)
Albumin: 4.4 g/dL (ref 3.5–5.5)
BILIRUBIN TOTAL: 0.4 mg/dL (ref 0.0–1.2)
Bilirubin, Direct: 0.12 mg/dL (ref 0.00–0.40)
TOTAL PROTEIN: 7 g/dL (ref 6.0–8.5)

## 2018-01-10 MED ORDER — ATORVASTATIN CALCIUM 40 MG PO TABS: 40.0000 mg | ORAL_TABLET | Freq: Every day | ORAL | 3 refills | Status: DC

## 2018-01-10 NOTE — Telephone Encounter (Signed)
Patient aware of elevated cholesterol, and meds being sent to [facility]. Patient stated that her Cardiologist just took her off of a statin two days ago but she is unaware of the name. Patient will call the office when she returns home to let staff know exactly which statin it was. Nat Christen, CMA

## 2018-01-10 NOTE — Progress Notes (Signed)
Labs drawn

## 2018-01-10 NOTE — Telephone Encounter (Signed)
-----   Message from Clent Demark, PA-C sent at 01/10/2018  9:19 AM EDT ----- Cholesterol elevated. I have sent atorvastatin 40 mg to [facility] on [address].

## 2018-01-10 NOTE — Telephone Encounter (Signed)
Reviewed cardiology note and it does not say to stop statin.

## 2018-01-10 NOTE — Telephone Encounter (Signed)
Noted  

## 2018-01-15 ENCOUNTER — Telehealth (INDEPENDENT_AMBULATORY_CARE_PROVIDER_SITE_OTHER): Payer: Self-pay | Admitting: Physician Assistant

## 2018-01-15 ENCOUNTER — Emergency Department (HOSPITAL_COMMUNITY)
Admission: EM | Admit: 2018-01-15 | Discharge: 2018-01-15 | Disposition: A | Discharge status: Home / Self Care | Payer: Medicare Other | Attending: Emergency Medicine | Admitting: Emergency Medicine

## 2018-01-15 ENCOUNTER — Emergency Department (HOSPITAL_COMMUNITY): Payer: Medicare Other

## 2018-01-15 ENCOUNTER — Encounter (HOSPITAL_COMMUNITY): Payer: Self-pay | Admitting: Emergency Medicine

## 2018-01-15 ENCOUNTER — Emergency Department (HOSPITAL_BASED_OUTPATIENT_CLINIC_OR_DEPARTMENT_OTHER)
Admit: 2018-01-15 | Discharge: 2018-01-15 | Disposition: A | Discharge status: Home / Self Care | Payer: Medicare Other | Attending: Emergency Medicine | Admitting: Emergency Medicine

## 2018-01-15 DIAGNOSIS — Z7984 Long term (current) use of oral hypoglycemic drugs: Secondary | ICD-10-CM | POA: Diagnosis not present

## 2018-01-15 DIAGNOSIS — I5022 Chronic systolic (congestive) heart failure: Secondary | ICD-10-CM | POA: Diagnosis not present

## 2018-01-15 DIAGNOSIS — Z87891 Personal history of nicotine dependence: Secondary | ICD-10-CM | POA: Insufficient documentation

## 2018-01-15 DIAGNOSIS — J449 Chronic obstructive pulmonary disease, unspecified: Secondary | ICD-10-CM | POA: Diagnosis not present

## 2018-01-15 DIAGNOSIS — M25571 Pain in right ankle and joints of right foot: Secondary | ICD-10-CM | POA: Diagnosis not present

## 2018-01-15 DIAGNOSIS — M7989 Other specified soft tissue disorders: Secondary | ICD-10-CM | POA: Insufficient documentation

## 2018-01-15 DIAGNOSIS — J45909 Unspecified asthma, uncomplicated: Secondary | ICD-10-CM | POA: Insufficient documentation

## 2018-01-15 DIAGNOSIS — I110 Hypertensive heart disease with heart failure: Secondary | ICD-10-CM | POA: Insufficient documentation

## 2018-01-15 DIAGNOSIS — M79671 Pain in right foot: Secondary | ICD-10-CM | POA: Diagnosis not present

## 2018-01-15 LAB — CBG MONITORING, ED: GLUCOSE-CAPILLARY: 133 mg/dL — AB (ref 65–99)

## 2018-01-15 MED ORDER — HYDROCODONE-ACETAMINOPHEN 5-325 MG PO TABS
1.0000 | ORAL_TABLET | Freq: Once | ORAL | Status: AC
  Administered 2018-01-15: 1 via ORAL
  Filled 2018-01-15: qty 1

## 2018-01-15 NOTE — Telephone Encounter (Signed)
Pt wanted to let you know that she went to the Emergency room for her Right foot pain and she wanted you to call her regarding her pain medicine  Thank you

## 2018-01-15 NOTE — ED Triage Notes (Signed)
Pt to ER for evaluation of non-traumatic right foot pain. Pulses intact. Coming from home. VSS. CBG 146.

## 2018-01-15 NOTE — ED Notes (Signed)
CBG 133 reported to Steward Ros RN

## 2018-01-15 NOTE — Discharge Instructions (Addendum)
1. Medications: Alternate 600 mg of ibuprofen and [phone] mg of Tylenol every 3 hours as needed for pain. Do not exceed 4000 mg of Tylenol daily.  Take ibuprofen with food to avoid stomach issues  2. Treatment: rest, ice, elevate and use brace, drink plenty of fluids, gentle stretching 3. Follow Up: Please followup with orthopedics as directed or your PCP in 1 week if no improvement for discussion of your diagnoses and further evaluation after today's visit; if you do not have a primary care doctor use the resource guide provided to find one; Please return to the ER for worsening symptoms or other concerns

## 2018-01-15 NOTE — ED Provider Notes (Signed)
[facility] EMERGENCY DEPARTMENT Provider Note   CSN: [id number] Arrival date & time: 01/15/18  8341     History   Chief Complaint Chief Complaint  Patient presents with  . Foot Pain    HPI Stacey Lin is a 52 y.o. female with history of polysubstance abuse, anemia, asthma, CHF, chronic headaches, COPD, HLD, HTN presents today for evaluation of acute onset, constant right foot pain.  She states that pain is constant and throbbing primarily localized to the ankle circumferentially with radiation to the dorsum of the right foot.  She denies numbness or tingling.  She denies fevers or chills.  She denies any known trauma but states she has been walking a lot and taking the bus for several doctor's appointments in the past couple of days.  She states pain began on Friday 4 days ago.  She has tried elevation and applying heat without significant relief of her symptoms.  Pain worsens with any sort of movement of the ankle or palpation.  She denies any recent travel or surgeries, no hemoptysis, no prior history of DVT or PE.  She is not on any estrogen replacement therapy.   The history is provided by the patient.    Past Medical History:  Diagnosis Date  . Anemia   . Anginal pain ([place])    2 months  . Arrhythmia   . Asthma   . CHF (congestive heart failure) ([place])    1990s  . Chronic headaches   . COPD (chronic obstructive pulmonary disease) ([place])    ??  . Hypercholesteremia   . Hypertension     Patient Active Problem List   Diagnosis Date Noted  . Dyspnea 04/14/2016  . Streptococcus pneumoniae pneumonia ([place]) 04/07/2016  . Acute respiratory failure with hypoxia and hypercarbia ([place]) 04/03/2016  . Elevated lactic acid level 04/03/2016  . Leukocytosis 04/03/2016  . New onset type 2 diabetes mellitus ([place]) 04/03/2016  . Abnormal urinalysis 04/03/2016  . Trichomonas infection 04/03/2016  . Non compliance w medication regimen 01/12/2016  . AKI (acute kidney  injury) ([place]) 03/14/2014  . Chronic systolic HF (heart failure) ([place]) 03/14/2014  . Musculoskeletal chest pain 03/14/2014  . Periodic health assessment, general screening, adult 10/03/2013  . Mitral valve regurgitation 08/22/2013  . FIBROIDS, UTERUS 05/11/2010  . ANEMIA, SECONDARY TO BLOOD LOSS 05/11/2010  . Asthmatic bronchitis 05/11/2010  . TOBACCO USER 07/07/2009  . Essential hypertension, benign 06/19/2009  . MENORRHAGIA 06/19/2009  . COCAINE ABUSE, HX OF 06/19/2009    Past Surgical History:  Procedure Laterality Date  . LEFT AND RIGHT HEART CATHETERIZATION WITH CORONARY ANGIOGRAM N/A 06/06/2014   Procedure: LEFT AND RIGHT HEART CATHETERIZATION WITH CORONARY ANGIOGRAM;  Surgeon: Jolaine Artist, MD;  Location: [facility] CATH LAB;  Service: Cardiovascular;  Laterality: N/A;  . MULTIPLE EXTRACTIONS WITH ALVEOLOPLASTY Bilateral 07/24/2015   Procedure: MULTIPLE EXTRACTIONS WITH ALVEOLOPLASTY,  BILATERAL TORI ;  Surgeon: Diona Browner, DDS;  Location: [facility];  Service: Oral Surgery;  Laterality: Bilateral;  . TUBAL LIGATION  1986     OB History   None      Home Medications    Prior to Admission medications   Medication Sig Start Date End Date Taking? Authorizing Provider  acetaminophen (TYLENOL) 500 MG tablet Take 1,500 mg by mouth every 6 (six) hours as needed for headache (pain).     [provider]  atorvastatin (LIPITOR) 40 MG tablet Take 1 tablet (40 mg total) by mouth daily. 01/10/18   Domenica Fail  Shanon Brow, PA-C  blood glucose meter kit and supplies KIT Dispense based on patient and insurance preference. Use up to four times daily as directed. (FOR ICD-9 250.00, 250.01). 01/09/18   Clent Demark, PA-C  Blood Glucose Monitoring Suppl (ACCU-CHEK AVIVA PLUS) w/Device KIT 1 each by Does not apply route 3 (three) times daily. 01/09/18   Clent Demark, PA-C  budesonide-formoterol [facility]) 80-4.5 MCG/ACT inhaler Take 2 puffs first thing in am and then another 2 puffs about  12 hours later. 04/14/16   Tanda Rockers, MD  carvedilol (COREG) 3.125 MG tablet Take 1 tablet (3.125 mg total) by mouth 2 (two) times daily. 09/26/17   Bensimhon, Shaune Pascal, MD  Cholecalciferol (VITAMIN D-3) 5000 units TABS Take 1 tablet by mouth daily. 01/02/18   Clent Demark, PA-C  clonazePAM (KLONOPIN) 0.5 MG tablet Take 1 tablet (0.5 mg total) by mouth at bedtime. 01/02/18   Clent Demark, PA-C  escitalopram (LEXAPRO) 10 MG tablet Take 1 tablet (10 mg total) by mouth daily. 01/02/18   Clent Demark, PA-C  furosemide (LASIX) 40 MG tablet Take 1 tablet (40 mg total) by mouth daily. 01/02/18   Darrick Grinder D, NP  glimepiride (AMARYL) 2 MG tablet Take 1 tablet (2 mg total) by mouth daily before breakfast. 01/02/18   Clent Demark, PA-C  glucose blood test strip Use one strip TID 01/08/18   Clent Demark, PA-C  Lancets [facility]) lancets Use one new lancet TID 01/09/18   Clent Demark, PA-C  sacubitril-valsartan (ENTRESTO) 49-51 MG Take 1 tablet by mouth 2 (two) times daily. 11/13/17   Bensimhon, Shaune Pascal, MD  spironolactone (ALDACTONE) 25 MG tablet Take 1 tablet (25 mg total) daily by mouth. 09/07/17   Bensimhon, Shaune Pascal, MD    Family History Family History  Problem Relation Age of Onset  . Hypertension Mother   . Allergies Mother   . Asthma Mother   . Heart disease Mother   . Stomach cancer Mother        Deceased, 35  . Seizures Mother   . Hypertension Father        Deceased  . Hypertension Maternal Grandmother   . Healthy Brother   . Healthy Son   . Healthy Daughter     Social History Social History   Tobacco Use  . Smoking status: Former Smoker    Packs/day: 0.10    Years: 20.00    Pack years: 2.00    Types: Cigarettes    Last attempt to quit: 10/04/2017    Years since quitting: 0.2  . Smokeless tobacco: Never Used  . Tobacco comment: Smokes occasionally when someone has a cig to have, doesn't buy them.  Substance Use Topics  . Alcohol  use: No    Alcohol/week: 0.0 oz  . Drug use: Yes    Frequency: 4.0 times per week    Types: "Crack" cocaine    Comment: RELAPSE 12/2016     Allergies   Patient has no known allergies.   Review of Systems Review of Systems  Constitutional: Negative for chills and fever.  Musculoskeletal: Positive for arthralgias.  Neurological: Negative for syncope, weakness and numbness.     Physical Exam Updated Vital Signs BP 121/74 (BP Location: Right Arm)   Pulse 67   Temp 98.2 F (36.8 C) (Oral)   Resp 18   LMP  (LMP Unknown)   SpO2 98%   Physical Exam  Constitutional: She appears well-developed and  well-nourished. No distress.  HENT:  Head: Normocephalic and atraumatic.  Eyes: Conjunctivae are normal. Right eye exhibits no discharge. Left eye exhibits no discharge.  Neck: No JVD present. No tracheal deviation present.  Cardiovascular: Normal rate and intact distal pulses.  2+ DP/PT pulses bilaterally, no significant lower extremity edema noted.  There is pain on calf squeeze of the right calf and Homans sign is present on the right  Pulmonary/Chest: Effort normal.  Abdominal: She exhibits no distension.  Musculoskeletal: She exhibits no edema.  Decreased range of motion with plantarflexion and dorsiflexion of the right ankle secondary to pain.  She is able to range her toes without difficulty.  5/5 strength of bilateral EHLs and 4+/5 strength with plantarflexion and dorsiflexion of the right ankle against resistance secondary to pain.  Examination of the Achilles tendon is unremarkable bilaterally.  No erythema noted.  Possible mild swelling of the dorsum of the right foot.  No crepitus or deformity noted.  No ecchymosis.  No erythema or warmth noted.  Neurological: She is alert.  Fluent speech, no facial droop, sensation intact to soft touch of bilateral lower extremities.  Difficulty ambulating secondary to pain  Skin: Skin is warm and dry. No erythema.  Psychiatric: She has a  normal mood and affect. Her behavior is normal.  Nursing note and vitals reviewed.    ED Treatments / Results  Labs (all labs ordered are listed, but only abnormal results are displayed) Labs Reviewed  CBG MONITORING, ED - Abnormal; Notable for the following components:      Result Value   Glucose-Capillary 133 (*)    All other components within normal limits    EKG None  Radiology Dg Ankle Complete Right  Result Date: 01/15/2018 CLINICAL DATA:  Right ankle pain for several days, no known injury, initial encounter EXAM: RIGHT ANKLE - COMPLETE 3+ VIEW COMPARISON:  None. FINDINGS: Mild soft tissue swelling about the ankle is noted. No acute fracture or dislocation is seen. Small calcaneal spurs are noted. IMPRESSION: Mild soft tissue swelling without acute bony abnormality. Electronically Signed   By: Inez Catalina M.D.   On: 01/15/2018 10:56    Procedures Procedures (including critical care time)  Medications Ordered in ED Medications  HYDROcodone-acetaminophen (NORCO/VICODIN) 5-325 MG per tablet 1 tablet (1 tablet Oral Given 01/15/18 1008)     Initial Impression / Assessment and Plan / ED Course  I have reviewed the triage vital signs and the nursing notes.  Pertinent labs & imaging results that were available during my care of the patient were reviewed by me and considered in my medical decision making (see chart for details).     Patient presents with pain of the right ankle for 4 days.  She is afebrile, vital signs are stable.  She is neurovascularly intact.  Mild swelling noted but non-pitting.  No erythema or warmth to the extremity.  Radiographs reviewed by me show mild soft tissue swelling without any acute bony abnormalities, no evidence of fracture or dislocation.  She is able to ambulate despite pain but does not want to bear weight on the extremity secondary to pain.  DVT study shows no evidence of DVT but does show a right Baker's cyst.  Do not think patient is  undergoing an acute CHF exacerbation.  There is no evidence of osteomyelitis.  Doubt septic joint.  No further emergent workup required at this time.  Suspect pain is secondary to increased ambulation over the past several days.  RICE therapy  indicated and discussed with patient. Will discharge with ASO ankle brace and crutches.  Discussed pain management with Tylenol and ibuprofen.  Recommend follow-up with her primary care physician or an orthopedist in 1 week for reevaluation of symptoms.  Discussed indications for return to the ED. Pt verbalized understanding of and agreement with plan and is safe for discharge home at this time. She has no complaints prior to discharge.  Final Clinical Impressions(s) / ED Diagnoses   Final diagnoses:  Acute right ankle pain    ED Discharge Orders    None      Debroah Baller 01/15/18 1438  Noemi Chapel, MD 01/15/18 2110

## 2018-01-15 NOTE — Progress Notes (Signed)
RLE venous duplex prelim: negative for DVT. Baker's cyst noted. Landry Mellow, RDMS, RVT

## 2018-01-19 ENCOUNTER — Ambulatory Visit (INDEPENDENT_AMBULATORY_CARE_PROVIDER_SITE_OTHER): Payer: Medicare Other

## 2018-01-19 ENCOUNTER — Ambulatory Visit (INDEPENDENT_AMBULATORY_CARE_PROVIDER_SITE_OTHER): Payer: Medicare Other | Admitting: Podiatry

## 2018-01-19 ENCOUNTER — Encounter: Payer: Self-pay | Admitting: Podiatry

## 2018-01-19 ENCOUNTER — Other Ambulatory Visit: Payer: Self-pay | Admitting: Podiatry

## 2018-01-19 DIAGNOSIS — M779 Enthesopathy, unspecified: Secondary | ICD-10-CM | POA: Diagnosis not present

## 2018-01-19 DIAGNOSIS — M7751 Other enthesopathy of right foot: Secondary | ICD-10-CM | POA: Diagnosis not present

## 2018-01-19 DIAGNOSIS — R600 Localized edema: Secondary | ICD-10-CM

## 2018-01-19 DIAGNOSIS — M25571 Pain in right ankle and joints of right foot: Secondary | ICD-10-CM

## 2018-01-19 DIAGNOSIS — M1711 Unilateral primary osteoarthritis, right knee: Secondary | ICD-10-CM | POA: Diagnosis not present

## 2018-01-19 MED ORDER — TRIAMCINOLONE ACETONIDE 10 MG/ML IJ SUSP
10.0000 mg | Freq: Once | INTRAMUSCULAR | Status: AC
  Administered 2018-01-19: 10 mg

## 2018-01-19 NOTE — Telephone Encounter (Signed)
Pt can come here on f/u. Thank you.

## 2018-01-19 NOTE — Telephone Encounter (Signed)
Patient aware to pain medication will be discussed at her follow up on 01/30/18. Nat Christen, CMA

## 2018-01-19 NOTE — Progress Notes (Signed)
Subjective:   Patient ID: Stacey Lin, female   DOB: 52 y.o.   MRN: [id number]   HPI Patient states she is developed a lot of pain on top of her right foot and has swelling and has terrible problems with her knee and is due to have knee replacement.  Does not remember specific injury and states it is slightly better than it was but is still very sore   ROS      Objective:  Physical Exam  Neurovascular status intact negative Homans sign was noted with patient found to have several different problems with one being dorsal inflammation of the extensor tendon complex and discomfort around the right lateral ankle with possibility for ankle sprain or other pathology.  There is edema in the foot ankle with +1 pitting negative changes within the calf muscle itself     Assessment:  Dorsal tendinitis with possibility for inflammatory reaction ankle pain with sprain of the ankle and other pathology     Plan:  H&P condition reviewed at great length and today I did do dorsal injection 3 mg Kenalog 5 mm Xylocaine and I went ahead and I placed an Unna boot to help with the swelling reduce pressure of the ankle.  I discussed this right ankle and may have to treat depending on response and I encouraged patient to take this off if it should cause her any irritation but if it feels good leave it on for 3 days  X-rays indicate there is no signs of stress fracture at the current time or advanced arthritis

## 2018-01-22 ENCOUNTER — Ambulatory Visit: Payer: Medicare Other | Admitting: Podiatry

## 2018-01-23 ENCOUNTER — Other Ambulatory Visit: Payer: Self-pay | Admitting: Internal Medicine

## 2018-01-23 ENCOUNTER — Other Ambulatory Visit (HOSPITAL_COMMUNITY): Payer: Self-pay | Admitting: Internal Medicine

## 2018-01-23 DIAGNOSIS — F418 Other specified anxiety disorders: Secondary | ICD-10-CM

## 2018-01-24 ENCOUNTER — Telehealth (HOSPITAL_COMMUNITY): Payer: Self-pay

## 2018-01-24 NOTE — Telephone Encounter (Signed)
Patient called to request cardiac surgical clearance for knee surgery.  Recently saw sports med doctor at [facility] who recommended Knee repair/replacement. This RN called office to follow up and office stated they will fax over clearance form now, then once they received clearance form will refer patient to orthopedic surgeon for initial consult. Patient made aware of updates and appreciative of call. NO further questions/concerns/needs at this time.  Renee Pain, RN

## 2018-01-25 ENCOUNTER — Other Ambulatory Visit (HOSPITAL_COMMUNITY): Payer: Self-pay | Admitting: *Deleted

## 2018-01-25 ENCOUNTER — Telehealth (INDEPENDENT_AMBULATORY_CARE_PROVIDER_SITE_OTHER): Payer: Self-pay | Admitting: Physician Assistant

## 2018-01-25 MED ORDER — SPIRONOLACTONE 25 MG PO TABS: 25.0000 mg | ORAL_TABLET | Freq: Every day | ORAL | 3 refills | Status: DC

## 2018-01-25 NOTE — Telephone Encounter (Signed)
Pt called back to let us know to never mind about the refill of this med, she will see him next week

## 2018-01-25 NOTE — Telephone Encounter (Signed)
Pt called to request a refill for clonazePAM (KLONOPIN) 0.5 MG tablet Please sent it to Stacey Lin, [place] AT [address] Please follow up

## 2018-01-30 ENCOUNTER — Encounter (INDEPENDENT_AMBULATORY_CARE_PROVIDER_SITE_OTHER): Payer: Self-pay | Admitting: Physician Assistant

## 2018-01-30 ENCOUNTER — Ambulatory Visit (INDEPENDENT_AMBULATORY_CARE_PROVIDER_SITE_OTHER): Payer: Medicare Other | Admitting: Physician Assistant

## 2018-01-30 ENCOUNTER — Other Ambulatory Visit: Payer: Self-pay

## 2018-01-30 VITALS — BP 102/70 | HR 84 | Temp 98.7°F | Ht 61.0 in | Wt 164.4 lb

## 2018-01-30 DIAGNOSIS — F418 Other specified anxiety disorders: Secondary | ICD-10-CM

## 2018-01-30 DIAGNOSIS — M1711 Unilateral primary osteoarthritis, right knee: Secondary | ICD-10-CM

## 2018-01-30 MED ORDER — ESCITALOPRAM OXALATE 20 MG PO TABS: 20.0000 mg | ORAL_TABLET | Freq: Every day | ORAL | 3 refills | Status: DC

## 2018-01-30 MED ORDER — OXYCODONE-ACETAMINOPHEN 10-325 MG PO TABS: 1.0000 | ORAL_TABLET | Freq: Two times a day (BID) | ORAL | 0 refills | Status: DC

## 2018-01-30 NOTE — Patient Instructions (Signed)
Total Knee Replacement Total knee replacement is a surgery to replace your knee joint with a man-made (prosthetic) joint. The man-made joint is called a prosthesis. It replaces parts of the thigh bone (femur), lower leg bone (tibia), and kneecap (patella). This surgery is done to lessen pain and improve knee movement. What happens before the procedure?  Ask your doctor about: ? Changing or stopping your normal medicines. This is especially important if you take diabetes medicines or blood thinners. ? Taking medicines such as aspirin and ibuprofen. These medicines can thin your blood. Do not take these medicines before your procedure if your doctor tells you not to.  Get all dental care that you need done before your procedure. Plan to not have dental work done for 3 months after your surgery.  Follow instructions from your doctor about what you cannot eat or drink.  Ask your doctor how your surgical site will be marked or identified.  You may be given antibiotic medicine to help prevent infection.  If your doctor prescribes physical therapy, do exercises as told.  Do not use any tobacco products, such as cigarettes, chewing tobacco, or e-cigarettes. If you need help quitting, ask your doctor.  You may have a physical exam.  You may have tests, such as: ? X-rays. ? MRI. ? CT scan. ? Bone scans.  You may have a blood or urine sample taken.  Plan to have someone take you home after the procedure.  If you will be going home right after the procedure, plan to have someone with you for at least 24 hours. It is best to have someone help care for you for at least 4-6 weeks after surgery. What happens during the procedure?  To reduce your risk of infection: ? Your health care team will wash or sanitize their hands. ? Your skin will be washed with soap.  An IV tube will be put into one of your veins.  You will be given one or more of the following: ? Sedative. This is a medicine that  makes you relaxed. ? Local anesthetic. This is a medicine to numb the area. ? General anesthetic. This is a medicine that makes you fall asleep. ? Spinal anesthetic. This is a medicine that numbs your body below the waist. ? Regional anesthetic. This is a medicine that numbs everything below the injection site.  A cut (incision) will be made in your knee.  Damaged parts of your thigh bone, lower leg bone, and kneecap will be removed.  A piece of metal (liner) will be placed on your thigh bone. Pieces of plastic will be placed on your lower leg bone and the underside of your kneecap.  One or more small tubes (drains) may be placed near your cut to help drain fluid.  Your cut will be closed with stitches (sutures), skin glue, or skin tape (adhesive) strips. Medicine may be put on your cut.  A bandage (dressing) will be placed over your cut. The procedure may vary among doctors and hospitals. What happens after the procedure?  Your blood pressure, heart rate, breathing rate, and blood oxygen level will be monitored often until the medicines you were given have worn off.  You may continue to get fluids and medicines through an IV tube.  You will have some pain. There will be medicines to help you.  You may have fluid coming from a drain.  You may have to wear special socks (compression stockings). These help to prevent blood clots and   reduce swelling in your legs.  You will be told to move around as much as possible.  You may be given a continuous passive motion machine to use at home. You will be shown how to use this machine.  Do not drive for 24 hours if you received a sedative. This information is not intended to replace advice given to you by your health care provider. Make sure you discuss any questions you have with your health care provider. Document Released: 01/02/2012 Document Revised: 06/13/2016 Document Reviewed: 09/16/2015 Elsevier Interactive Patient Education  2017  Elsevier Inc.  

## 2018-01-30 NOTE — Progress Notes (Signed)
Subjective:  Patient ID: Stacey Lin, female    DOB: 03-18-1966  Age: 52 y.o. MRN: [id number]  CC:  F/u depression with anxiety  HPI Stacey Lin is a 52 y.o. female with a medical history of HTN, HLD, CHF, CKD3, Asthma, COPD, Crack/Cocaine use, and anemia presents to f/u on depression with anxiety. Prescribed escitalopram 10 mg and clonazepam 0.5 mg nearly one month ago. PHQ9 27 and GAD7 17 nearly one month ago. PHQ9 20 and GAD7 17 today. Taking escitalopram as directed. Ran out of clonazepam. Pt worries much about her son and the drug dealing he is doing. Overall, she wishes to continue with escitalopram.    Patient has chronic right knee pain and was told by her orthopedic specialist at [place] that she will need a total knee replacement. Orthopedic specialist is requesting cardiology clearance from her cardiologist before any procedure is done. He is not prescribing any pain medication which is confirmed on PMP Aware.      Outpatient Medications Prior to Visit  Medication Sig Dispense Refill  . acetaminophen (TYLENOL) 500 MG tablet Take 1,500 mg by mouth every 6 (six) hours as needed for headache (pain).     Marland Kitchen atorvastatin (LIPITOR) 40 MG tablet Take 1 tablet (40 mg total) by mouth daily. 90 tablet 3  . blood glucose meter kit and supplies KIT Dispense based on patient and insurance preference. Use up to four times daily as directed. (FOR ICD-9 250.00, 250.01). 1 each 0  . Blood Glucose Monitoring Suppl (ACCU-CHEK AVIVA PLUS) w/Device KIT 1 each by Does not apply route 3 (three) times daily. 1 kit 0  . budesonide-formoterol (SYMBICORT) 80-4.5 MCG/ACT inhaler Take 2 puffs first thing in am and then another 2 puffs about 12 hours later. 1 Inhaler 11  . carvedilol (COREG) 3.125 MG tablet Take 1 tablet (3.125 mg total) by mouth 2 (two) times daily. 60 tablet 3  . Cholecalciferol (VITAMIN D-3) 5000 units TABS Take 1 tablet by mouth daily. 30 tablet 0  . escitalopram (LEXAPRO)  10 MG tablet Take 1 tablet (10 mg total) by mouth daily. 30 tablet 2  . furosemide (LASIX) 40 MG tablet Take 1 tablet (40 mg total) by mouth daily. 90 tablet 3  . glimepiride (AMARYL) 2 MG tablet Take 1 tablet (2 mg total) by mouth daily before breakfast. 30 tablet 5  . glucose blood test strip Use one strip TID 100 each 12  . Lancets (ACCU-CHEK SOFT TOUCH) lancets Use one new lancet TID 100 each 12  . oxyCODONE-acetaminophen (PERCOCET) 10-325 MG tablet TK 1 T PO TID  0  . potassium chloride (K-DUR,KLOR-CON) 10 MEQ tablet TK 1 T PO ONCE D  0  . sacubitril-valsartan (ENTRESTO) 49-51 MG Take 1 tablet by mouth 2 (two) times daily. 60 tablet 2  . spironolactone (ALDACTONE) 25 MG tablet Take 1 tablet (25 mg total) by mouth daily. 30 tablet 3  . clonazePAM (KLONOPIN) 0.5 MG tablet Take 1 tablet (0.5 mg total) by mouth at bedtime. (Patient not taking: Reported on 01/30/2018) 14 tablet 0   No facility-administered medications prior to visit.      ROS Review of Systems  Constitutional: Negative for chills, fever and malaise/fatigue.  Eyes: Negative for blurred vision.  Respiratory: Negative for shortness of breath.   Cardiovascular: Negative for chest pain and palpitations.  Gastrointestinal: Negative for abdominal pain and nausea.  Genitourinary: Negative for dysuria and hematuria.  Musculoskeletal: Positive for joint pain. Negative for myalgias.  Skin:  Subjective:  Patient ID: Stacey Lin, female    DOB: 03-18-1966  Age: 52 y.o. MRN: [id number]  CC:  F/u depression with anxiety  HPI Stacey Lin is a 52 y.o. female with a medical history of HTN, HLD, CHF, CKD3, Asthma, COPD, Crack/Cocaine use, and anemia presents to f/u on depression with anxiety. Prescribed escitalopram 10 mg and clonazepam 0.5 mg nearly one month ago. PHQ9 27 and GAD7 17 nearly one month ago. PHQ9 20 and GAD7 17 today. Taking escitalopram as directed. Ran out of clonazepam. Pt worries much about her son and the drug dealing he is doing. Overall, she wishes to continue with escitalopram.    Patient has chronic right knee pain and was told by her orthopedic specialist at [place] that she will need a total knee replacement. Orthopedic specialist is requesting cardiology clearance from her cardiologist before any procedure is done. He is not prescribing any pain medication which is confirmed on PMP Aware.      Outpatient Medications Prior to Visit  Medication Sig Dispense Refill  . acetaminophen (TYLENOL) 500 MG tablet Take 1,500 mg by mouth every 6 (six) hours as needed for headache (pain).     Marland Kitchen atorvastatin (LIPITOR) 40 MG tablet Take 1 tablet (40 mg total) by mouth daily. 90 tablet 3  . blood glucose meter kit and supplies KIT Dispense based on patient and insurance preference. Use up to four times daily as directed. (FOR ICD-9 250.00, 250.01). 1 each 0  . Blood Glucose Monitoring Suppl (ACCU-CHEK AVIVA PLUS) w/Device KIT 1 each by Does not apply route 3 (three) times daily. 1 kit 0  . budesonide-formoterol (SYMBICORT) 80-4.5 MCG/ACT inhaler Take 2 puffs first thing in am and then another 2 puffs about 12 hours later. 1 Inhaler 11  . carvedilol (COREG) 3.125 MG tablet Take 1 tablet (3.125 mg total) by mouth 2 (two) times daily. 60 tablet 3  . Cholecalciferol (VITAMIN D-3) 5000 units TABS Take 1 tablet by mouth daily. 30 tablet 0  . escitalopram (LEXAPRO)  10 MG tablet Take 1 tablet (10 mg total) by mouth daily. 30 tablet 2  . furosemide (LASIX) 40 MG tablet Take 1 tablet (40 mg total) by mouth daily. 90 tablet 3  . glimepiride (AMARYL) 2 MG tablet Take 1 tablet (2 mg total) by mouth daily before breakfast. 30 tablet 5  . glucose blood test strip Use one strip TID 100 each 12  . Lancets (ACCU-CHEK SOFT TOUCH) lancets Use one new lancet TID 100 each 12  . oxyCODONE-acetaminophen (PERCOCET) 10-325 MG tablet TK 1 T PO TID  0  . potassium chloride (K-DUR,KLOR-CON) 10 MEQ tablet TK 1 T PO ONCE D  0  . sacubitril-valsartan (ENTRESTO) 49-51 MG Take 1 tablet by mouth 2 (two) times daily. 60 tablet 2  . spironolactone (ALDACTONE) 25 MG tablet Take 1 tablet (25 mg total) by mouth daily. 30 tablet 3  . clonazePAM (KLONOPIN) 0.5 MG tablet Take 1 tablet (0.5 mg total) by mouth at bedtime. (Patient not taking: Reported on 01/30/2018) 14 tablet 0   No facility-administered medications prior to visit.      ROS Review of Systems  Constitutional: Negative for chills, fever and malaise/fatigue.  Eyes: Negative for blurred vision.  Respiratory: Negative for shortness of breath.   Cardiovascular: Negative for chest pain and palpitations.  Gastrointestinal: Negative for abdominal pain and nausea.  Genitourinary: Negative for dysuria and hematuria.  Musculoskeletal: Positive for joint pain. Negative for myalgias.  Skin:

## 2018-02-08 ENCOUNTER — Telehealth (HOSPITAL_COMMUNITY): Payer: Self-pay | Admitting: *Deleted

## 2018-02-08 NOTE — Telephone Encounter (Signed)
Received surgical clearance request from [place] for patient to have Right total knee replacement.  Patient is cleared for surgery from a cardiac standpoint per Dr. Haroldine Laws.  Clearance and last office visit note faxed today to [phone].

## 2018-02-19 ENCOUNTER — Ambulatory Visit: Payer: Medicare Other | Admitting: Registered"

## 2018-02-19 ENCOUNTER — Other Ambulatory Visit (HOSPITAL_COMMUNITY): Payer: Self-pay | Admitting: Internal Medicine

## 2018-02-20 ENCOUNTER — Ambulatory Visit: Payer: Medicare Other | Admitting: Registered"

## 2018-02-21 ENCOUNTER — Encounter: Payer: Medicare HMO | Attending: Physician Assistant | Admitting: Registered"

## 2018-02-21 DIAGNOSIS — Z713 Dietary counseling and surveillance: Secondary | ICD-10-CM | POA: Insufficient documentation

## 2018-02-21 DIAGNOSIS — E119 Type 2 diabetes mellitus without complications: Secondary | ICD-10-CM | POA: Diagnosis not present

## 2018-02-21 NOTE — Patient Instructions (Signed)
Continue checking your blood sugar before meals, to have an A1c of 7% or less the target numbers are 80-130 mg/dL Eat meals that have a carbohydrate, a protein and non-starchy vegetables.

## 2018-02-21 NOTE — Progress Notes (Signed)
Diabetes Self-Management Education  Visit Type: Follow-up  Appt. Start Time: 1035 Appt. End Time: 1105  02/21/2018  [name], identified by name and date of birth, is a 52 y.o. female with a diagnosis of Diabetes: Type 2.   Student shadowing RD - Patient agreed to student being present.   ASSESSMENT Patient was distressed because she did not think her BG were in range. Patient shared log and RD reviewed which almost all of the numbers over the last week were 130 or below, fasting and PPBG. Patient had a couple of reading in the 35s and RD asked patient what she does when it is low. Pt states she lays down and sometimes will eat something first before resting, but not always. RD reviewed rule of 15.  Patient states she she knows she should eat breakfast but some mornings she does not get up until after 10 and by the time she is hungry it is lunch time. RD tried to reassure her that is okay.  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   Patient states her antidepressant medication has been increased.   Diabetes Self-Management Education - 02/21/18 1040      Visit Information   Visit Type  Follow-up      Initial Visit   Diabetes Type  Type 2      Complications   How often do you check your blood sugar?  1-2 times/day    Fasting Blood glucose range (mg/dL)  70-129    Number of hypoglycemic episodes per month  3    Can you tell when your blood sugar is low?  Yes    What do you do if your blood sugar is low?  lay down, sometimes eat something first      Patient Education   Monitoring  Identified appropriate SMBG and/or A1C goals.    Acute complications  Taught treatment of hypoglycemia - the 15 rule.      Individualized Goals (developed by patient)   Nutrition  General guidelines for healthy choices and portions discussed    Monitoring   test my blood glucose as discussed    Reducing Risk  treat  hypoglycemia with 15 grams of carbs if blood glucose less than 70mg /dL      Outcomes   Expected Outcomes  Demonstrated interest in learning. Expect positive outcomes    Future DMSE  4-6 wks    Program Status  Not Completed      Subsequent Visit   Since your last visit have you continued or begun to take your medications as prescribed?  Yes    Since your last visit, are you checking your blood glucose at least once a day?  Yes     Individualized Plan for Diabetes Self-Management Training:   Learning Objective:  Patient will have a greater understanding of diabetes self-management. Patient education plan is to attend individual and/or group sessions per assessed needs and concerns.   Patient Instructions  Continue checking your blood sugar before meals, to have an A1c of 7% or less the target numbers are 80-130 mg/dL Eat meals that have a carbohydrate, a protein and non-starchy vegetables.   Expected Outcomes:  Demonstrated interest in learning. Expect positive outcomes  Education material provided: Treating Low Blood sugar  If problems or questions, patient to contact team via:  Phone  Future DSME appointment: 4-6 wks

## 2018-02-22 ENCOUNTER — Other Ambulatory Visit (HOSPITAL_COMMUNITY)
Admission: RE | Admit: 2018-02-22 | Discharge: 2018-02-22 | Disposition: A | Discharge status: Home / Self Care | Payer: Medicare HMO | Source: Ambulatory Visit | Attending: Physician Assistant | Admitting: Physician Assistant

## 2018-02-22 ENCOUNTER — Encounter (INDEPENDENT_AMBULATORY_CARE_PROVIDER_SITE_OTHER): Payer: Self-pay | Admitting: Physician Assistant

## 2018-02-22 ENCOUNTER — Other Ambulatory Visit: Payer: Self-pay

## 2018-02-22 ENCOUNTER — Ambulatory Visit (INDEPENDENT_AMBULATORY_CARE_PROVIDER_SITE_OTHER): Payer: Medicare HMO | Admitting: Physician Assistant

## 2018-02-22 VITALS — BP 116/78 | HR 73 | Temp 98.2°F | Ht 61.0 in | Wt 166.2 lb

## 2018-02-22 DIAGNOSIS — Z124 Encounter for screening for malignant neoplasm of cervix: Secondary | ICD-10-CM | POA: Diagnosis not present

## 2018-02-22 DIAGNOSIS — Z1239 Encounter for other screening for malignant neoplasm of breast: Secondary | ICD-10-CM

## 2018-02-22 DIAGNOSIS — Z1211 Encounter for screening for malignant neoplasm of colon: Secondary | ICD-10-CM

## 2018-02-22 DIAGNOSIS — Z1231 Encounter for screening mammogram for malignant neoplasm of breast: Secondary | ICD-10-CM

## 2018-02-22 DIAGNOSIS — D259 Leiomyoma of uterus, unspecified: Secondary | ICD-10-CM

## 2018-02-22 DIAGNOSIS — Z0000 Encounter for general adult medical examination without abnormal findings: Secondary | ICD-10-CM | POA: Diagnosis not present

## 2018-02-22 DIAGNOSIS — H1013 Acute atopic conjunctivitis, bilateral: Secondary | ICD-10-CM

## 2018-02-22 DIAGNOSIS — B379 Candidiasis, unspecified: Secondary | ICD-10-CM | POA: Diagnosis not present

## 2018-02-22 DIAGNOSIS — Z23 Encounter for immunization: Secondary | ICD-10-CM | POA: Diagnosis not present

## 2018-02-22 MED ORDER — CETIRIZINE HCL 10 MG PO TABS: 10.0000 mg | ORAL_TABLET | Freq: Every day | ORAL | 2 refills | Status: DC

## 2018-02-22 MED ORDER — KETOTIFEN FUMARATE 0.025 % OP SOLN: 1.0000 [drp] | Freq: Two times a day (BID) | OPHTHALMIC | 0 refills | Status: DC

## 2018-02-22 MED ORDER — FLUCONAZOLE 150 MG PO TABS: 150.0000 mg | ORAL_TABLET | Freq: Once | ORAL | 0 refills | Status: AC

## 2018-02-22 NOTE — Patient Instructions (Signed)

## 2018-02-22 NOTE — Progress Notes (Signed)
Subjective:  Patient ID: Stacey Lin, female    DOB: Mar 18, 1966  Age: 52 y.o. MRN: [id number]  CC: annual exam  HPI  Lunna Brazil a 52 y.o.femalewith a medical history of HTN, HLD, CHF, CKD3, Asthma, COPD, Crack/Cocaine use, uterine fibroids, and anemia presents for annual physical and pap. Complains of itchy eyes attributed to seasonal allergies. Also complains of pelvic discomfort attributed to multiple fibroids. US pelvic/transvaginal 2010 revealed multiple fibroids.      Outpatient Medications Prior to Visit  Medication Sig Dispense Refill  . acetaminophen (TYLENOL) 500 MG tablet Take 1,500 mg by mouth every 6 (six) hours as needed for headache (pain).     Marland Kitchen atorvastatin (LIPITOR) 40 MG tablet Take 1 tablet (40 mg total) by mouth daily. 90 tablet 3  . blood glucose meter kit and supplies KIT Dispense based on patient and insurance preference. Use up to four times daily as directed. (FOR ICD-9 250.00, 250.01). 1 each 0  . Blood Glucose Monitoring Suppl (ACCU-CHEK AVIVA PLUS) w/Device KIT 1 each by Does not apply route 3 (three) times daily. 1 kit 0  . budesonide-formoterol (SYMBICORT) 80-4.5 MCG/ACT inhaler Take 2 puffs first thing in am and then another 2 puffs about 12 hours later. 1 Inhaler 11  . carvedilol (COREG) 3.125 MG tablet Take 1 tablet (3.125 mg total) by mouth 2 (two) times daily. 60 tablet 3  . Cholecalciferol (VITAMIN D-3) 5000 units TABS Take 1 tablet by mouth daily. 30 tablet 0  . clonazePAM (KLONOPIN) 0.5 MG tablet Take 1 tablet (0.5 mg total) by mouth at bedtime. 14 tablet 0  . escitalopram (LEXAPRO) 20 MG tablet Take 1 tablet (20 mg total) by mouth daily. 30 tablet 3  . furosemide (LASIX) 40 MG tablet Take 1 tablet (40 mg total) by mouth daily. 90 tablet 3  . glimepiride (AMARYL) 2 MG tablet Take 1 tablet (2 mg total) by mouth daily before breakfast. 30 tablet 5  . glucose blood test strip Use one strip TID 100 each 12  . Lancets (ACCU-CHEK SOFT TOUCH)  lancets Use one new lancet TID 100 each 12  . oxyCODONE-acetaminophen (PERCOCET) 10-325 MG tablet Take 1 tablet by mouth every 12 (twelve) hours. 60 tablet 0  . potassium chloride (K-DUR,KLOR-CON) 10 MEQ tablet TK 1 T PO ONCE D  0  . sacubitril-valsartan (ENTRESTO) 49-51 MG Take 1 tablet by mouth 2 (two) times daily. 60 tablet 2  . spironolactone (ALDACTONE) 25 MG tablet Take 1 tablet (25 mg total) by mouth daily. 30 tablet 3   No facility-administered medications prior to visit.      ROS Review of Systems  Constitutional: Negative for chills, fever and malaise/fatigue.  Eyes: Negative for blurred vision.       Itchy eyes  Respiratory: Negative for shortness of breath.   Cardiovascular: Negative for chest pain and palpitations.  Gastrointestinal: Negative for abdominal pain and nausea.  Genitourinary: Negative for dysuria and hematuria.  Musculoskeletal: Positive for joint pain. Negative for myalgias.  Skin: Negative for rash.  Neurological: Positive for headaches. Negative for tingling.  Psychiatric/Behavioral: Negative for depression. The patient is not nervous/anxious.     Objective:  BP 116/78 (BP Location: Left Arm, Patient Position: Sitting, Cuff Size: Normal)   Pulse 73   Temp 98.2 F (36.8 C) (Oral)   Ht 5\' 1"  (1.549 m)   Wt 166 lb 3.2 oz (75.4 kg)   LMP  (LMP Unknown)   SpO2 96%   BMI 31.40 kg/m  BP/Weight 02/22/2018 01/30/2018 01/15/2018  Systolic BP 116 102 121  Diastolic BP 78 70 74  Wt. (Lbs) 166.2 164.4 -  BMI 31.4 31.06 -      Physical Exam  Constitutional: She is oriented to person, place, and time.  Well developed, well nourished, NAD, polite  HENT:  Head: Normocephalic and atraumatic.  Eyes: No scleral icterus.  Neck: Normal range of motion. Neck supple. No thyromegaly present.  Cardiovascular: Normal rate, regular rhythm and normal heart sounds.  Pulmonary/Chest: Effort normal and breath sounds normal.  Abdominal: Soft. Bowel sounds are normal.  There is no tenderness.  Genitourinary:  Genitourinary Comments: Mild to moderate vaginal wall atrophy. Scant curdy vaginal discharge. Cervix with cyst, no motion tenderness. No adnexal tenderness or mass bilaterally. Some uterine discomfort on palpation.  Musculoskeletal: She exhibits no edema.  Neurological: She is alert and oriented to person, place, and time.  Skin: Skin is warm and dry. No rash noted. No erythema. No pallor.  Psychiatric: She has a normal mood and affect. Her behavior is normal. Thought content normal.  Vitals reviewed.    Assessment & Plan:    1. Annual physical exam - Comprehensive metabolic panel - CBC with Differential - TSH  2. Screening for cervical cancer - Cytology - PAP([place])  3. Screening for colon cancer - Fecal occult blood, imunochemical  4. Screening for breast cancer - MM DIGITAL SCREENING BILATERAL; Future  5. Need for Tdap vaccination - Tdap vaccine greater than or equal to 7yo IM  6. Allergic conjunctivitis of both eyes - ketotifen (ALAWAY) 0.025 % ophthalmic solution; Place 1 drop into both eyes 2 (two) times daily.  Dispense: 5 mL; Refill: 0 - cetirizine (ZYRTEC) 10 MG tablet; Take 1 tablet (10 mg total) by mouth daily.  Dispense: 30 tablet; Refill: 2   Meds ordered this encounter  Medications  . ketotifen (ALAWAY) 0.025 % ophthalmic solution    Sig: Place 1 drop into both eyes 2 (two) times daily.    Dispense:  5 mL    Refill:  0    Order Specific Question:   Supervising Provider    Answer:   Quentin Angst [id number]  . cetirizine (ZYRTEC) 10 MG tablet    Sig: Take 1 tablet (10 mg total) by mouth daily.    Dispense:  30 tablet    Refill:  2    Order Specific Question:   Supervising Provider    Answer:   Quentin Angst [[id number]]    Follow-up:  PRN  Loletta Specter PA

## 2018-02-23 ENCOUNTER — Telehealth (INDEPENDENT_AMBULATORY_CARE_PROVIDER_SITE_OTHER): Payer: Self-pay

## 2018-02-23 LAB — CBC WITH DIFFERENTIAL/PLATELET
BASOS: 0 %
Basophils Absolute: 0 10*3/uL (ref 0.0–0.2)
EOS (ABSOLUTE): 0.2 10*3/uL (ref 0.0–0.4)
EOS: 2 %
HEMATOCRIT: 37.8 % (ref 34.0–46.6)
Hemoglobin: 12.1 g/dL (ref 11.1–15.9)
Immature Grans (Abs): 0 10*3/uL (ref 0.0–0.1)
Immature Granulocytes: 0 %
LYMPHS ABS: 2.4 10*3/uL (ref 0.7–3.1)
Lymphs: 24 %
MCH: 29.7 pg (ref 26.6–33.0)
MCHC: 32 g/dL (ref 31.5–35.7)
MCV: 93 fL (ref 79–97)
MONOS ABS: 0.5 10*3/uL (ref 0.1–0.9)
Monocytes: 5 %
NEUTROS ABS: 7 10*3/uL (ref 1.4–7.0)
NEUTROS PCT: 69 %
Platelets: 474 10*3/uL — ABNORMAL HIGH (ref 150–379)
RBC: 4.07 x10E6/uL (ref 3.77–5.28)
RDW: 13.5 % (ref 12.3–15.4)
WBC: 10.1 10*3/uL (ref 3.4–10.8)

## 2018-02-23 LAB — COMPREHENSIVE METABOLIC PANEL WITH GFR
ALT: 31 IU/L (ref 0–32)
AST: 25 IU/L (ref 0–40)
Albumin/Globulin Ratio: 1.7 (ref 1.2–2.2)
Albumin: 4.7 g/dL (ref 3.5–5.5)
Alkaline Phosphatase: 136 IU/L — ABNORMAL HIGH (ref 39–117)
BUN/Creatinine Ratio: 16 (ref 9–23)
BUN: 18 mg/dL (ref 6–24)
Bilirubin Total: 0.4 mg/dL (ref 0.0–1.2)
CO2: 26 mmol/L (ref 20–29)
Calcium: 10.1 mg/dL (ref 8.7–10.2)
Chloride: 101 mmol/L (ref 96–106)
Creatinine, Ser: 1.13 mg/dL — ABNORMAL HIGH (ref 0.57–1.00)
GFR calc Af Amer: 64 mL/min/1.73
GFR calc non Af Amer: 56 mL/min/1.73 — ABNORMAL LOW
Globulin, Total: 2.7 g/dL (ref 1.5–4.5)
Glucose: 112 mg/dL — ABNORMAL HIGH (ref 65–99)
Potassium: 4.4 mmol/L (ref 3.5–5.2)
Sodium: 143 mmol/L (ref 134–144)
Total Protein: 7.4 g/dL (ref 6.0–8.5)

## 2018-02-23 LAB — CYTOLOGY - PAP
ADEQUACY: ABSENT
BACTERIAL VAGINITIS: NEGATIVE
Candida vaginitis: NEGATIVE
Chlamydia: NEGATIVE
Diagnosis: NEGATIVE
Neisseria Gonorrhea: NEGATIVE
Trichomonas: NEGATIVE

## 2018-02-23 LAB — TSH: TSH: 2.63 u[IU]/mL (ref 0.450–4.500)

## 2018-02-23 NOTE — Telephone Encounter (Signed)
-----   Message from Clent Demark, PA-C sent at 02/23/2018  5:13 PM EDT ----- Pap is completely normal.

## 2018-02-23 NOTE — Telephone Encounter (Signed)
-----   Message from Clent Demark, PA-C sent at 02/23/2018  2:25 PM EDT ----- Platelets are elevated. Suspect due to fibroids. Mild kidney impairment but stable. Normal thyroid.

## 2018-02-23 NOTE — Telephone Encounter (Signed)
Patient aware of normal thyroid, elevated platelets suspected due to fibroids. Mild kidney impairment but stable. Nat Christen, CMA

## 2018-02-23 NOTE — Telephone Encounter (Signed)
Patient is aware of normal pap. Nat Christen, CMA

## 2018-02-24 ENCOUNTER — Other Ambulatory Visit (HOSPITAL_COMMUNITY): Payer: Self-pay | Admitting: Internal Medicine

## 2018-02-26 ENCOUNTER — Other Ambulatory Visit (HOSPITAL_COMMUNITY): Payer: Self-pay | Admitting: *Deleted

## 2018-02-26 MED ORDER — SACUBITRIL-VALSARTAN 49-51 MG PO TABS: 1.0000 | ORAL_TABLET | Freq: Two times a day (BID) | ORAL | 2 refills | Status: DC

## 2018-03-08 ENCOUNTER — Telehealth (INDEPENDENT_AMBULATORY_CARE_PROVIDER_SITE_OTHER): Payer: Self-pay | Admitting: Physician Assistant

## 2018-03-08 NOTE — Telephone Encounter (Signed)
Pt called to request a refill on her needles and test strips. Please follow up To -[facility] [place], [place]

## 2018-03-11 ENCOUNTER — Other Ambulatory Visit: Payer: Self-pay | Admitting: Nurse Practitioner

## 2018-03-11 MED ORDER — GLUCOSE BLOOD VI STRP: ORAL_STRIP | 12 refills | Status: DC

## 2018-03-11 MED ORDER — ACCU-CHEK SOFT TOUCH LANCETS MISC: 12 refills | Status: DC

## 2018-03-11 NOTE — Telephone Encounter (Signed)
Script sent  

## 2018-03-21 ENCOUNTER — Ambulatory Visit: Payer: Medicare Other

## 2018-03-22 ENCOUNTER — Telehealth (INDEPENDENT_AMBULATORY_CARE_PROVIDER_SITE_OTHER): Payer: Self-pay | Admitting: Physician Assistant

## 2018-03-22 NOTE — Telephone Encounter (Signed)
Pt called to request a Disability Parking Place Card. Please follow up.

## 2018-03-23 NOTE — Telephone Encounter (Signed)
Patient advised to discuss with PCP at next OV on 6/20. Nat Christen, CMA

## 2018-03-27 ENCOUNTER — Encounter: Payer: Medicare HMO | Attending: Physician Assistant | Admitting: Registered"

## 2018-03-27 DIAGNOSIS — E119 Type 2 diabetes mellitus without complications: Secondary | ICD-10-CM | POA: Diagnosis not present

## 2018-03-27 DIAGNOSIS — Z713 Dietary counseling and surveillance: Secondary | ICD-10-CM | POA: Diagnosis present

## 2018-03-27 NOTE — Progress Notes (Signed)
Diabetes Self-Management Education  Visit Type: Follow-up  Appt. Start Time: 1130 Appt. End Time: 1200  03/27/2018  [name], identified by name and date of birth, is a 52 y.o. female with a diagnosis of Diabetes: Type 2.   ASSESSMENT  Patient is still concerned that she is not grasping what she should be doing to take care of the diabetes. Pt started to cry when she was talking about not eating breakfast because people are telling her she has to eat breakfast but she doesn't feel like eating breakfast. RD tried to explain in a previous visit that she doesn't need to wake up to eat early, if she doesn't wake up until 11 am, eating at 12 is just fine. Pt states her FBG usually stays below a few times has gone a little higher than 130.   Patient states she lies around all day and likes to have the house very dark. Pt states her daughter will open the blinds, but Pt will close them after her daughter leaves the room. Pt states she cannot sleep and has been taking too much Advil PM trying to sleep.  Pt states she is looking forward to getting an updated A1c test because her doctor is waiting for her to bring the number down before doing surgery on knee  Pt states she had been getting mental health services at [facility], but had used all the benefit she qualifies for and they referred her to [facility]. Pt states she couldn't make it to appointment due to injury/on crutches and couldn't remember the name or phone number to reschedule. RD encouraged her to follow-up for counseling.   Patient states she does not use internet. RD provided printout of mental health providers.  Diabetes Self-Management Education - 03/27/18 1100      Visit Information   Visit Type  Follow-up      Initial Visit   Diabetes Type  Type 2      Complications   Last HgB A1C per patient/outside source  -- getting re-checked this month    How often do you check your blood sugar?  1-2  times/day    Fasting Blood glucose range (mg/dL)  70-129    Postprandial Blood glucose range (mg/dL)  70-129;130-179    Number of hypoglycemic episodes per month  2    Can you tell when your blood sugar is low?  Yes    What do you do if your blood sugar is low?  eats a piece of cake or something sweet    Have you had a dilated eye exam in the past 12 months?  No      Dietary Intake   Snack (morning)  PB crackers    Lunch  toast peanut butterOR Ensure    Dinner  rice, chicken, broccoli cheese    Beverage(s)  water, soda      Exercise   Exercise Type  ADL's      Patient Education   Physical activity and exercise   Role of exercise on diabetes management, blood pressure control and cardiac health.    Psychosocial adjustment  Role of stress on diabetes;Brainstormed with patient on coping mechanisms for social situations, getting support from significant others, dealing with feelings about diabetes      Individualized Goals (developed by patient)   Physical Activity  15 minutes per day    Monitoring   test my blood glucose as discussed    Problem Solving  --  follow-up with counseling services    Reducing Risk  treat hypoglycemia with 15 grams of carbs if blood glucose less than 70mg /dL      Outcomes   Expected Outcomes  Demonstrated interest in learning. Expect positive outcomes    Future DMSE  4-6 wks    Program Status  Not Completed      Subsequent Visit   Since your last visit have you continued or begun to take your medications as prescribed?  Yes    Since your last visit, are you checking your blood glucose at least once a day?  Yes     Individualized Plan for Diabetes Self-Management Training:   Learning Objective:  Patient will have a greater understanding of diabetes self-management. Patient education plan is to attend individual and/or group sessions per assessed needs and concerns.   Patient Instructions  Use the list of providers to find someone to help you with your  depression. Use the sleep hygiene tip sheet for better sleep. Consider making an eye appointment since it has been more than a year. Consider asking your doctor's office if you can go in and have the blood work done before your next appointment. When you have low blood sugar you can drink regular soda, 1/2 cup to bring it up.   Expected Outcomes:  Demonstrated interest in learning. Expect positive outcomes  Education material provided: Sleep Hygiene, [employer] shopping  If problems or questions, patient to contact team via:  Phone  Future DSME appointment: 4-6 wks

## 2018-03-27 NOTE — Patient Instructions (Addendum)
Use the list of providers to find someone to help you with your depression. Use the sleep hygiene tip sheet for better sleep. Consider making an eye appointment since it has been more than a year. Consider asking your doctor's office if you can go in and have the blood work done before your next appointment. When you have low blood sugar you can drink regular soda, 1/2 cup to bring it up.

## 2018-04-03 ENCOUNTER — Telehealth (HOSPITAL_COMMUNITY): Payer: Self-pay | Admitting: *Deleted

## 2018-04-03 NOTE — Telephone Encounter (Signed)
Entresto PA submitted through [employer] today.  Conf# [id number].  Will wait for approval.

## 2018-04-04 ENCOUNTER — Other Ambulatory Visit (INDEPENDENT_AMBULATORY_CARE_PROVIDER_SITE_OTHER): Payer: Self-pay | Admitting: Physician Assistant

## 2018-04-04 DIAGNOSIS — E119 Type 2 diabetes mellitus without complications: Secondary | ICD-10-CM

## 2018-04-04 MED ORDER — BLOOD GLUCOSE MONITOR KIT: PACK | 0 refills | Status: DC

## 2018-04-10 ENCOUNTER — Ambulatory Visit (HOSPITAL_BASED_OUTPATIENT_CLINIC_OR_DEPARTMENT_OTHER)
Admission: RE | Admit: 2018-04-10 | Discharge: 2018-04-10 | Disposition: A | Discharge status: Home / Self Care | Payer: Medicare HMO | Source: Ambulatory Visit | Attending: Cardiology | Admitting: Cardiology

## 2018-04-10 ENCOUNTER — Ambulatory Visit (HOSPITAL_COMMUNITY)
Admission: RE | Admit: 2018-04-10 | Discharge: 2018-04-10 | Disposition: A | Discharge status: Home / Self Care | Payer: Medicare HMO | Source: Ambulatory Visit | Attending: Cardiology | Admitting: Cardiology

## 2018-04-10 VITALS — BP 92/68 | HR 69 | Wt 175.1 lb

## 2018-04-10 DIAGNOSIS — Z7984 Long term (current) use of oral hypoglycemic drugs: Secondary | ICD-10-CM | POA: Diagnosis not present

## 2018-04-10 DIAGNOSIS — I5022 Chronic systolic (congestive) heart failure: Secondary | ICD-10-CM

## 2018-04-10 DIAGNOSIS — J449 Chronic obstructive pulmonary disease, unspecified: Secondary | ICD-10-CM | POA: Diagnosis not present

## 2018-04-10 DIAGNOSIS — Z79899 Other long term (current) drug therapy: Secondary | ICD-10-CM | POA: Insufficient documentation

## 2018-04-10 DIAGNOSIS — D649 Anemia, unspecified: Secondary | ICD-10-CM | POA: Insufficient documentation

## 2018-04-10 DIAGNOSIS — Z800 Family history of malignant neoplasm of digestive organs: Secondary | ICD-10-CM | POA: Insufficient documentation

## 2018-04-10 DIAGNOSIS — I429 Cardiomyopathy, unspecified: Secondary | ICD-10-CM | POA: Insufficient documentation

## 2018-04-10 DIAGNOSIS — Z8249 Family history of ischemic heart disease and other diseases of the circulatory system: Secondary | ICD-10-CM | POA: Diagnosis not present

## 2018-04-10 DIAGNOSIS — Z79891 Long term (current) use of opiate analgesic: Secondary | ICD-10-CM | POA: Insufficient documentation

## 2018-04-10 DIAGNOSIS — E7800 Pure hypercholesterolemia, unspecified: Secondary | ICD-10-CM | POA: Insufficient documentation

## 2018-04-10 DIAGNOSIS — F1910 Other psychoactive substance abuse, uncomplicated: Secondary | ICD-10-CM | POA: Insufficient documentation

## 2018-04-10 DIAGNOSIS — Z820 Family history of epilepsy and other diseases of the nervous system: Secondary | ICD-10-CM | POA: Diagnosis not present

## 2018-04-10 DIAGNOSIS — I5042 Chronic combined systolic (congestive) and diastolic (congestive) heart failure: Secondary | ICD-10-CM | POA: Insufficient documentation

## 2018-04-10 DIAGNOSIS — I110 Hypertensive heart disease with heart failure: Secondary | ICD-10-CM | POA: Insufficient documentation

## 2018-04-10 DIAGNOSIS — I10 Essential (primary) hypertension: Secondary | ICD-10-CM | POA: Diagnosis not present

## 2018-04-10 DIAGNOSIS — F17200 Nicotine dependence, unspecified, uncomplicated: Secondary | ICD-10-CM | POA: Diagnosis not present

## 2018-04-10 MED ORDER — FUROSEMIDE 40 MG PO TABS: 40.0000 mg | ORAL_TABLET | ORAL | 3 refills | Status: DC

## 2018-04-10 NOTE — Progress Notes (Signed)
Patient ID: Stacey Lin, female   DOB: 08-10-1966, 52 y.o.   MRN: [id number]    Advanced Heart Failure Clinic Note   PCP: Dr Stacey Lin  Pulmonologist: Dr. Shelle Lin HF: Dr. Gala Lin   HPI: Stacey Lin is a 52 yo female with a history of anemia, combined systolic/diastolic heart failure, HTN, and history of polysubstance abuse. Initial ECHO (04/2010) EF 50-55% with grd 1 DD, LVIDd 47mm, mild to mod MR, RA and LA mildly dilated. In 2018 EF 60-65%.   Admitted to the hospital 08/19/13-08/29/13 with A/C systolic HF. Discharge weight 144 lbs.  Today she returns for HF follow up. Overall feeling fine. Denies PND/Orthopnea. SOB with exertion. Appetite ok. No fever or chills. Weight at home has been going. She has not used cocaine since February. She has not been to support group in the last few weeks. Smoking cigarettes 2 per day.  Taking all medications.  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 Mod MR and Mod-sev TR Echo (1/15): EF 45%, mild to moderate AI, moderate MR, RV normal size with mildly decreased systolic function.  CPX (5/15): FVC 66%, FEV1 59%, ratio 72%, peak VO2 15.7, slope 22.6 => mildly decreased functional capacity limited primarily by lungs.  Echo (05/2014): EF 55-60%, mild/mod AI, grade I DD and mild MR R/LHC (06/06/14): ectatic coronaries (suspect d/t HTN) no significant arthrosclerosis and normal EF. No significant MR noted Echo (7/16): EF 60-65%, moderate AI.  Echo (11/16): EF 55%, Mild LAE Echo 6/17: EF 20-25% severe central MR ECHO 03/2017: EF 6065% Grade IDD Mild MR  SH: Living in [place] with boyfriend. No ETOH or drug use. 2 cigs/day.   FH: Mother deceased: Dementia, seizures, stomach cancer        Father deceased: HTN  Review of systems complete and found to be negative unless  listed in HPI.    Past Medical History:  Diagnosis Date  . Anemia   . Anginal pain (HCC)    2 months  . Arrhythmia   . Asthma   . CHF (congestive heart failure) (HCC)    1990s  . Chronic headaches   . COPD (chronic obstructive pulmonary disease) (HCC)    ??  . Hypercholesteremia   . Hypertension     Current Outpatient Medications  Medication Sig Dispense Refill  . acetaminophen (TYLENOL) 500 MG tablet Take 1,500 mg by mouth every 6 (six) hours as needed for headache (pain).     Marland Kitchen atorvastatin (LIPITOR) 40 MG tablet Take 1 tablet (40 mg total) by mouth daily. 90 tablet 3  . blood glucose meter kit and supplies KIT Dispense based on patient and insurance preference. Use daily as directed. (FOR ICD-9 250.00, 250.01). 1 each 0  . Blood Glucose Monitoring Suppl (ACCU-CHEK AVIVA PLUS) w/Device KIT 1 each by Does not apply route 3 (three) times daily. 1 kit 0  . budesonide-formoterol (SYMBICORT) 80-4.5 MCG/ACT inhaler Take 2 puffs first thing in am and then another 2 puffs about 12 hours later. 1 Inhaler 11  . carvedilol (COREG) 3.125 MG tablet TAKE 1 TABLET BY MOUTH TWICE DAILY 60 tablet 3  . cetirizine (ZYRTEC) 10 MG tablet Take 1 tablet (10 mg total) by mouth daily. 30 tablet 2  . Cholecalciferol (VITAMIN D-3) 5000 units TABS Take 1 tablet by mouth daily. 30 tablet 0  .  clonazePAM (KLONOPIN) 0.5 MG tablet Take 1 tablet (0.5 mg total) by mouth at bedtime. 14 tablet 0  . escitalopram (LEXAPRO) 20 MG tablet Take 1 tablet (20 mg total) by mouth daily. 30 tablet 3  . furosemide (LASIX) 40 MG tablet Take 1 tablet (40 mg total) by mouth daily. 90 tablet 3  . glimepiride (AMARYL) 2 MG tablet Take 1 tablet (2 mg total) by mouth daily before breakfast. 30 tablet 5  . glucose blood test strip Use one strip TID 100 each 12  . ketotifen (ALAWAY) 0.025 % ophthalmic solution Place 1 drop into both eyes 2 (two) times daily. 5 mL 0  . Lancets (ACCU-CHEK SOFT TOUCH) lancets Use one new lancet TID 100  each 12  . oxyCODONE-acetaminophen (PERCOCET) 10-325 MG tablet Take 1 tablet by mouth every 12 (twelve) hours. 60 tablet 0  . potassium chloride (K-DUR,KLOR-CON) 10 MEQ tablet TK 1 T PO ONCE D  0  . sacubitril-valsartan (ENTRESTO) 49-51 MG Take 1 tablet by mouth 2 (two) times daily. 60 tablet 2  . spironolactone (ALDACTONE) 25 MG tablet Take 1 tablet (25 mg total) by mouth daily. 30 tablet 3   No current facility-administered medications for this encounter.    Vitals:   04/10/18 1047  BP: 92/68  Pulse: 69  SpO2: 99%  Weight: 175 lb 2 oz (79.4 kg)   Wt Readings from Last 3 Encounters:  04/10/18 175 lb 2 oz (79.4 kg)  02/22/18 166 lb 3.2 oz (75.4 kg)  01/30/18 164 lb 6.4 oz (74.6 kg)     PHYSICAL EXAM: General:  Well appearing. No resp difficulty HEENT: normal Neck: supple. no JVD. Carotids 2+ bilat; no bruits. No lymphadenopathy or thryomegaly appreciated. Cor: PMI nondisplaced. Regular rate & rhythm. No rubs, gallops or murmurs. Lungs: clear Abdomen: soft, nontender, nondistended. No hepatosplenomegaly. No bruits or masses. Good bowel sounds. Extremities: no cyanosis, clubbing, rash, edema Neuro: alert & orientedx3, cranial nerves grossly intact. moves all 4 extremities w/o difficulty. Affect pleasant   ASSESSMENT & PLAN:  1) Chronic systolic  HF with now normal EF:  Nonischemic cardiomyopathy, Echo 04/19/2017 now with EF 60-65% range. ECHO pending.  Volume status low. Cut back lasix to twice a week. every other day.  Stop potassium - Continue coreg 3.125 mg BID -  Cont spiro 25 mg daily. I reviewed BMET from  02/22/2018 . Stable.  - Continue Entresto 49/51.   2.) HTN:  - Stable 3) Substance abuse She met with HFSW today regarding ongoing counseling.  4) Tobacco Abuse Discussed smoking cessation.   Follow up in 6 months.   Greater than 50% of the 25 minute visit was spent in counseling/coordination of care regarding disease state education, salt/fluid restriction, sliding  scale diuretics, and medication compliance.   Stacey Stetler NP-C  10:53 AM

## 2018-04-10 NOTE — Progress Notes (Signed)
  Echocardiogram 2D Echocardiogram has been performed.  [facility] 04/10/2018, 10:45 AM

## 2018-04-10 NOTE — Progress Notes (Signed)
CSW met with patient in the clinic. Patient states she is doing well and remains clean. She spoke at length about "changing my circle". Patient reports she is hanging around a new circle of friends which has helped her to remain clean and feel very supported. She shared an old friend recently reached out to her and she explained the need to remain distance from this friend as "she will bring me down". Patient verbalizes the understanding of the 12 step program and continuing with "a good circle to stay clean". CSW provided supportive listening and discussed the continuation of meetings for ongoing treatment. Patient verbalizes understanding and will reach out to CSW as needed. Patient provided with bus passes to get home. CSW continues to follow for supportive needs as they arise. Raquel Sarna, [place], [place]

## 2018-04-10 NOTE — Patient Instructions (Signed)
STOP Potassium.  DECREASE Lasix to twice weekly on Monday and Friday mornings only.  Follow up 4-6 months. We will call you closer to this time, or you may call our office to schedule 1 month before you are due to be seen. Take all medication as prescribed the day of your appointment. Bring all medications with you to your appointment.  Do the following things EVERYDAY: 1) Weigh yourself in the morning before breakfast. Write it down and keep it in a log. 2) Take your medicines as prescribed 3) Eat low salt foods-Limit salt (sodium) to 2000 mg per day.  4) Stay as active as you can everyday 5) Limit all fluids for the day to less than 2 liters

## 2018-04-12 ENCOUNTER — Encounter (INDEPENDENT_AMBULATORY_CARE_PROVIDER_SITE_OTHER): Payer: Self-pay | Admitting: Physician Assistant

## 2018-04-12 ENCOUNTER — Ambulatory Visit (INDEPENDENT_AMBULATORY_CARE_PROVIDER_SITE_OTHER): Payer: Medicaid Other | Admitting: Physician Assistant

## 2018-04-12 ENCOUNTER — Other Ambulatory Visit: Payer: Self-pay

## 2018-04-12 VITALS — BP 103/73 | HR 76 | Temp 98.0°F | Ht 61.0 in | Wt 175.6 lb

## 2018-04-12 DIAGNOSIS — E119 Type 2 diabetes mellitus without complications: Secondary | ICD-10-CM | POA: Diagnosis not present

## 2018-04-12 DIAGNOSIS — M109 Gout, unspecified: Secondary | ICD-10-CM

## 2018-04-12 LAB — POCT GLYCOSYLATED HEMOGLOBIN (HGB A1C): HEMOGLOBIN A1C: 6 % — AB (ref 4.0–5.6)

## 2018-04-12 MED ORDER — ALLOPURINOL 100 MG PO TABS
100.0000 mg | ORAL_TABLET | Freq: Two times a day (BID) | ORAL | 5 refills | Status: DC
Start: 2018-04-12 — End: 2018-10-30

## 2018-04-12 MED ORDER — COLCHICINE 0.6 MG PO CAPS: 1.0000 | ORAL_CAPSULE | Freq: Every day | ORAL | 0 refills | Status: DC

## 2018-04-12 NOTE — Patient Instructions (Signed)

## 2018-04-12 NOTE — Progress Notes (Signed)
Subjective:  Patient ID: Stacey Lin, female    DOB: 09-29-1966  Age: 52 y.o. MRN: [id number]  CC: handicap placard, gout  HPI Chrisa Renbarger a 52 y.o.femalewith a medical history of depression, anxiety, HTN, HLD, CHF, CKD3, Asthma, COPD, primary OA of right knee, Crack/Cocaine use, uterine fibroids, and anemia presents on orthopedic specialist f/u. Says she had fluid drained at [facility] which revealed gout crystals. Took allopurinol and oxycodone with some relief of pain. Pt asks for handicap placard for her osteoarthritis of right knee. She wants to know if her sugar is controlled enough to have TKA or right knee. No other symptoms or complaints.     Outpatient Medications Prior to Visit  Medication Sig Dispense Refill  . acetaminophen (TYLENOL) 500 MG tablet Take 1,500 mg by mouth every 6 (six) hours as needed for headache (pain).     Marland Kitchen atorvastatin (LIPITOR) 40 MG tablet Take 1 tablet (40 mg total) by mouth daily. 90 tablet 3  . blood glucose meter kit and supplies KIT Dispense based on patient and insurance preference. Use daily as directed. (FOR ICD-9 250.00, 250.01). 1 each 0  . Blood Glucose Monitoring Suppl (ACCU-CHEK AVIVA PLUS) w/Device KIT 1 each by Does not apply route 3 (three) times daily. 1 kit 0  . budesonide-formoterol (SYMBICORT) 80-4.5 MCG/ACT inhaler Take 2 puffs first thing in am and then another 2 puffs about 12 hours later. 1 Inhaler 11  . carvedilol (COREG) 3.125 MG tablet TAKE 1 TABLET BY MOUTH TWICE DAILY 60 tablet 3  . cetirizine (ZYRTEC) 10 MG tablet Take 1 tablet (10 mg total) by mouth daily. 30 tablet 2  . Cholecalciferol (VITAMIN D-3) 5000 units TABS Take 1 tablet by mouth daily. 30 tablet 0  . clonazePAM (KLONOPIN) 0.5 MG tablet Take 1 tablet (0.5 mg total) by mouth at bedtime. 14 tablet 0  . escitalopram (LEXAPRO) 20 MG tablet Take 1 tablet (20 mg total) by mouth daily. 30 tablet 3  . furosemide (LASIX) 40 MG tablet Take 1 tablet (40 mg total)  by mouth 2 (two) times a week. Monday and Friday mornings. 30 tablet 3  . glimepiride (AMARYL) 2 MG tablet Take 1 tablet (2 mg total) by mouth daily before breakfast. 30 tablet 5  . glucose blood test strip Use one strip TID 100 each 12  . ketotifen (ALAWAY) 0.025 % ophthalmic solution Place 1 drop into both eyes 2 (two) times daily. 5 mL 0  . Lancets (ACCU-CHEK SOFT TOUCH) lancets Use one new lancet TID 100 each 12  . NARCAN 4 MG/0.1ML LIQD nasal spray kit NAR REP ALN  0  . oxyCODONE-acetaminophen (PERCOCET) 10-325 MG tablet Take 1 tablet by mouth every 12 (twelve) hours. 60 tablet 0  . sacubitril-valsartan (ENTRESTO) 49-51 MG Take 1 tablet by mouth 2 (two) times daily. 60 tablet 2  . spironolactone (ALDACTONE) 25 MG tablet Take 1 tablet (25 mg total) by mouth daily. 30 tablet 3   No facility-administered medications prior to visit.      ROS Review of Systems  Constitutional: Negative for chills, fever and malaise/fatigue.  Eyes: Negative for blurred vision.  Respiratory: Negative for shortness of breath.   Cardiovascular: Negative for chest pain and palpitations.  Gastrointestinal: Negative for abdominal pain and nausea.  Genitourinary: Negative for dysuria and hematuria.  Musculoskeletal: Positive for joint pain. Negative for myalgias.  Skin: Negative for rash.  Neurological: Negative for tingling and headaches.  Psychiatric/Behavioral: Negative for depression. The patient is not nervous/anxious.  Objective:  BP 103/73 (BP Location: Left Arm, Patient Position: Sitting, Cuff Size: Normal)   Pulse 76   Temp 98 F (36.7 C) (Oral)   Ht 5\' 1"  (1.549 m)   Wt 175 lb 9.6 oz (79.7 kg)   LMP  (LMP Unknown)   SpO2 97%   BMI 33.18 kg/m   BP/Weight 04/12/2018 04/10/2018 02/22/2018  Systolic BP 103 92 116  Diastolic BP 73 68 78  Wt. (Lbs) 175.6 175.13 166.2  BMI 33.18 33.09 31.4      Physical Exam  Constitutional: She is oriented to person, place, and time.  Well developed,  well nourished, NAD, polite  HENT:  Head: Normocephalic and atraumatic.  Eyes: No scleral icterus.  Neck: Normal range of motion. Neck supple. No thyromegaly present.  Cardiovascular: Normal rate, regular rhythm and normal heart sounds.  Pulmonary/Chest: Effort normal and breath sounds normal.  Musculoskeletal: She exhibits no edema.  TTP of the medial aspect of right knee at midline. Mildly limited aROM of the right knee.  Neurological: She is alert and oriented to person, place, and time.  Skin: Skin is warm and dry. No rash noted. No erythema. No pallor.  Psychiatric: She has a normal mood and affect. Her behavior is normal. Thought content normal.  Vitals reviewed.    Assessment & Plan:   1. Type 2 diabetes mellitus without complication, without long-term current use of insulin (HCC) - HgB A1c 6.0% - Continue on same anti-glycemic regimen.   2. Acute gout, unspecified cause, unspecified site - Colchicine (MITIGARE) 0.6 MG CAPS; Take 1 capsule by mouth daily.  Dispense: 30 capsule; Refill: 0 - allopurinol (ZYLOPRIM) 100 MG tablet; Take 1 tablet (100 mg total) by mouth 2 (two) times daily.  Dispense: 60 tablet; Refill: 5 - Uric Acid level   Meds ordered this encounter  Medications  . Colchicine (MITIGARE) 0.6 MG CAPS    Sig: Take 1 capsule by mouth daily.    Dispense:  30 capsule    Refill:  0    Order Specific Question:   Supervising Provider    Answer:   Hoy Register [[id number]]  . allopurinol (ZYLOPRIM) 100 MG tablet    Sig: Take 1 tablet (100 mg total) by mouth 2 (two) times daily.    Dispense:  60 tablet    Refill:  5    Order Specific Question:   Supervising Provider    Answer:   Hoy Register [[id number]]    Follow-up: Return in about 8 weeks (around 06/07/2018) for uric acid and gout.   Loletta Specter PA

## 2018-04-13 LAB — SPECIMEN STATUS

## 2018-04-13 LAB — URIC ACID: URIC ACID: 8 mg/dL — AB (ref 2.5–7.1)

## 2018-04-19 ENCOUNTER — Telehealth (INDEPENDENT_AMBULATORY_CARE_PROVIDER_SITE_OTHER): Payer: Self-pay

## 2018-04-19 NOTE — Telephone Encounter (Signed)
-----   Message from Clent Demark, PA-C sent at 04/18/2018  8:54 AM EDT ----- Uric acid level elevated. Take allopurinol as prescribed. Uric acid will be redrawn in a future appointment.

## 2018-04-19 NOTE — Telephone Encounter (Signed)
Yes, once again, patient should not take atorvastatin and colchicine together. Take colchicine until pain is gone then may start allopurinol and atorvastatin.

## 2018-04-19 NOTE — Telephone Encounter (Signed)
Patient is aware of elevated uric acid, and recheck at future appointment. Advised to take allopurinol as prescribed. Patient states pharmacist told her that her atorvastatin and colchicin and allopurinol should not be taken together, as they will cause severe leg pain. Please advise. Nat Christen, CMA

## 2018-04-20 ENCOUNTER — Encounter (HOSPITAL_COMMUNITY): Payer: Self-pay

## 2018-04-20 NOTE — Progress Notes (Signed)
Entresto PA claim under [employer] denied. Submitted under covermymeds for humanda D coverage, pending approval.  Renee Pain, RN

## 2018-04-20 NOTE — Telephone Encounter (Signed)
Patient aware and expressed understanding. Nat Christen, CMA

## 2018-04-25 ENCOUNTER — Other Ambulatory Visit (HOSPITAL_COMMUNITY): Payer: Self-pay | Admitting: Adult Health

## 2018-04-27 ENCOUNTER — Other Ambulatory Visit (HOSPITAL_COMMUNITY): Payer: Self-pay | Admitting: Adult Health

## 2018-05-01 ENCOUNTER — Ambulatory Visit: Payer: Medicare HMO | Admitting: Registered"

## 2018-05-08 ENCOUNTER — Ambulatory Visit: Payer: Medicare HMO | Admitting: Registered"

## 2018-05-09 ENCOUNTER — Other Ambulatory Visit (INDEPENDENT_AMBULATORY_CARE_PROVIDER_SITE_OTHER): Payer: Self-pay | Admitting: Physician Assistant

## 2018-05-09 DIAGNOSIS — M109 Gout, unspecified: Secondary | ICD-10-CM

## 2018-05-09 NOTE — Telephone Encounter (Signed)
FWD to PCP. Tempestt S Roberts, CMA  

## 2018-05-16 ENCOUNTER — Ambulatory Visit: Payer: Medicare HMO | Admitting: Registered"

## 2018-05-25 ENCOUNTER — Other Ambulatory Visit (INDEPENDENT_AMBULATORY_CARE_PROVIDER_SITE_OTHER): Payer: Self-pay | Admitting: Physician Assistant

## 2018-05-25 DIAGNOSIS — I10 Essential (primary) hypertension: Secondary | ICD-10-CM

## 2018-05-25 DIAGNOSIS — H1013 Acute atopic conjunctivitis, bilateral: Secondary | ICD-10-CM

## 2018-05-25 NOTE — Telephone Encounter (Signed)
FWD to PCP. Stacey Lin, CMA  

## 2018-06-06 ENCOUNTER — Ambulatory Visit (INDEPENDENT_AMBULATORY_CARE_PROVIDER_SITE_OTHER): Payer: Medicare HMO | Admitting: Physician Assistant

## 2018-06-12 ENCOUNTER — Ambulatory Visit (INDEPENDENT_AMBULATORY_CARE_PROVIDER_SITE_OTHER): Payer: Medicaid Other | Admitting: Physician Assistant

## 2018-06-26 ENCOUNTER — Other Ambulatory Visit (INDEPENDENT_AMBULATORY_CARE_PROVIDER_SITE_OTHER): Payer: Self-pay | Admitting: Physician Assistant

## 2018-06-27 ENCOUNTER — Other Ambulatory Visit (INDEPENDENT_AMBULATORY_CARE_PROVIDER_SITE_OTHER): Payer: Self-pay | Admitting: Physician Assistant

## 2018-06-27 DIAGNOSIS — H1013 Acute atopic conjunctivitis, bilateral: Secondary | ICD-10-CM

## 2018-06-27 NOTE — Telephone Encounter (Signed)
FWD to PCP. Chermaine Schnyder S Offie Waide, CMA  

## 2018-06-27 NOTE — Telephone Encounter (Signed)
FWD to PCP. Tempestt S Roberts, CMA  

## 2018-06-29 ENCOUNTER — Other Ambulatory Visit (HOSPITAL_COMMUNITY): Payer: Self-pay | Admitting: Internal Medicine

## 2018-06-29 ENCOUNTER — Other Ambulatory Visit (INDEPENDENT_AMBULATORY_CARE_PROVIDER_SITE_OTHER): Payer: Self-pay | Admitting: Physician Assistant

## 2018-06-29 DIAGNOSIS — F418 Other specified anxiety disorders: Secondary | ICD-10-CM

## 2018-06-29 NOTE — Telephone Encounter (Signed)
FWD to PCP. Stacey Lin S Stacey Lin, CMA  

## 2018-07-09 ENCOUNTER — Other Ambulatory Visit: Payer: Self-pay

## 2018-07-09 ENCOUNTER — Ambulatory Visit (INDEPENDENT_AMBULATORY_CARE_PROVIDER_SITE_OTHER): Payer: Medicare HMO | Admitting: Physician Assistant

## 2018-07-09 ENCOUNTER — Encounter (INDEPENDENT_AMBULATORY_CARE_PROVIDER_SITE_OTHER): Payer: Self-pay | Admitting: Physician Assistant

## 2018-07-09 VITALS — BP 96/64 | HR 81 | Temp 98.3°F | Ht 61.0 in | Wt 191.2 lb

## 2018-07-09 DIAGNOSIS — F418 Other specified anxiety disorders: Secondary | ICD-10-CM

## 2018-07-09 DIAGNOSIS — R5383 Other fatigue: Secondary | ICD-10-CM

## 2018-07-09 DIAGNOSIS — R102 Pelvic and perineal pain: Secondary | ICD-10-CM | POA: Diagnosis not present

## 2018-07-09 DIAGNOSIS — G4700 Insomnia, unspecified: Secondary | ICD-10-CM | POA: Diagnosis not present

## 2018-07-09 LAB — POCT URINALYSIS DIPSTICK
BILIRUBIN UA: NEGATIVE
Blood, UA: NEGATIVE
Glucose, UA: NEGATIVE
KETONES UA: NEGATIVE
Leukocytes, UA: NEGATIVE
NITRITE UA: NEGATIVE
PH UA: 5 (ref 5.0–8.0)
PROTEIN UA: NEGATIVE
SPEC GRAV UA: 1.01 (ref 1.010–1.025)
UROBILINOGEN UA: 0.2 U/dL

## 2018-07-09 MED ORDER — ESCITALOPRAM OXALATE 20 MG PO TABS: 20.0000 mg | ORAL_TABLET | Freq: Every day | ORAL | 5 refills | Status: DC

## 2018-07-09 MED ORDER — HYDROXYZINE HCL 25 MG PO TABS: 25.0000 mg | ORAL_TABLET | Freq: Three times a day (TID) | ORAL | 0 refills | Status: DC | PRN

## 2018-07-09 NOTE — Progress Notes (Signed)
Subjective:  Patient ID: Stacey Lin, female    DOB: July 07, 1966  Age: 52 y.o. MRN: [id number]  CC: fatigue  HPI  Thea Teesdale a 52 y.o.femalewith a medical history of depression, anxiety, HTN, HLD, CHF, CKD3, Asthma, COPD, primary OA of right knee, Crack/Cocaine use,uterine fibroids,and anemia presents with complaint of fatigue. Says she ran out of anti-depressive medication and has not been able to sleep well. Waking up approximately every hour. Does not know when she ran out of her medications. Has also felt some suprapubic tenderness. No other urinary symptoms. Does not endorse back pain, flank pain, abdominal pain, f/c/n/v, vaginal discharge, CP, palpitations, SOB, HA, tingling, numbness, rash, or swelling.  Outpatient Medications Prior to Visit  Medication Sig Dispense Refill  . acetaminophen (TYLENOL) 500 MG tablet Take 1,500 mg by mouth every 6 (six) hours as needed for headache (pain).     Marland Kitchen allopurinol (ZYLOPRIM) 100 MG tablet Take 1 tablet (100 mg total) by mouth 2 (two) times daily. 60 tablet 5  . atorvastatin (LIPITOR) 40 MG tablet Take 1 tablet (40 mg total) by mouth daily. 90 tablet 3  . Blood Glucose Monitoring Suppl (ACCU-CHEK AVIVA PLUS) w/Device KIT 1 each by Does not apply route 3 (three) times daily. 1 kit 0  . budesonide-formoterol (SYMBICORT) 80-4.5 MCG/ACT inhaler Take 2 puffs first thing in am and then another 2 puffs about 12 hours later. 1 Inhaler 11  . carvedilol (COREG) 3.125 MG tablet TAKE 1 TABLET BY MOUTH TWICE DAILY 60 tablet 5  . Colchicine (MITIGARE) 0.6 MG CAPS Take 1 capsule by mouth daily. 30 capsule 0  . ENTRESTO 49-51 MG TAKE 1 TABLET BY MOUTH TWICE DAILY 60 tablet 5  . furosemide (LASIX) 40 MG tablet Take 1 tablet (40 mg total) by mouth 2 (two) times a week. Monday and Friday mornings. 30 tablet 3  . glucose blood test strip Use one strip TID 100 each 12  . Lancets (ACCU-CHEK SOFT TOUCH) lancets Use one new lancet TID 100 each 12  .  spironolactone (ALDACTONE) 25 MG tablet TAKE 1 TABLET(25 MG) BY MOUTH DAILY 30 tablet 3  . cetirizine (ZYRTEC) 10 MG tablet TAKE 1 TABLET BY MOUTH DAILY 30 tablet 0  . Cholecalciferol (VITAMIN D-3) 5000 units TABS Take 1 tablet by mouth daily. (Patient not taking: Reported on 04/12/2018) 30 tablet 0  . clonazePAM (KLONOPIN) 0.5 MG tablet Take 1 tablet (0.5 mg total) by mouth at bedtime. (Patient not taking: Reported on 04/12/2018) 14 tablet 0  . escitalopram (LEXAPRO) 20 MG tablet TAKE 1 TABLET BY MOUTH DAILY (Patient not taking: Reported on 07/09/2018) 30 tablet 0  . glimepiride (AMARYL) 2 MG tablet TAKE 1 TABLET BY MOUTH DAILY BEFORE BREAKFAST 30 tablet 0  . ketotifen (ALAWAY) 0.025 % ophthalmic solution Place 1 drop into both eyes 2 (two) times daily. 5 mL 0  . NARCAN 4 MG/0.1ML LIQD nasal spray kit NAR REP ALN  0  . blood glucose meter kit and supplies KIT Dispense based on patient and insurance preference. Use daily as directed. (FOR ICD-9 250.00, 250.01). 1 each 0   No facility-administered medications prior to visit.      ROS Review of Systems  Constitutional: Positive for malaise/fatigue. Negative for chills and fever.  Eyes: Negative for blurred vision.  Respiratory: Negative for shortness of breath.   Cardiovascular: Negative for chest pain and palpitations.  Gastrointestinal: Negative for abdominal pain and nausea.  Genitourinary: Negative for dysuria and hematuria.  Musculoskeletal: Negative for  joint pain and myalgias.  Skin: Negative for rash.  Neurological: Negative for tingling and headaches.  Psychiatric/Behavioral: Negative for depression. The patient is not nervous/anxious.     Objective:  BP 96/64 (BP Location: Left Arm, Patient Position: Sitting, Cuff Size: Normal)   Pulse 81   Temp 98.3 F (36.8 C) (Oral)   Ht 5\' 1"  (1.549 m)   Wt 191 lb 3.2 oz (86.7 kg)   LMP  (LMP Unknown)   SpO2 95%   BMI 36.13 kg/m   BP/Weight 07/09/2018 04/12/2018 04/10/2018  Systolic BP  96 103 92  Diastolic BP 64 73 68  Wt. (Lbs) 191.2 175.6 175.13  BMI 36.13 33.18 33.09      Physical Exam  Constitutional: She is oriented to person, place, and time.  Well developed, obese, NAD, polite, fatigued  HENT:  Head: Normocephalic and atraumatic.  Eyes: No scleral icterus.  Neck: Normal range of motion. Neck supple. No thyromegaly present.  Cardiovascular: Normal rate, regular rhythm and normal heart sounds.  Pulmonary/Chest: Effort normal and breath sounds normal.  Abdominal: Soft. Bowel sounds are normal. There is tenderness (mild suprapubic TTP).  Musculoskeletal: She exhibits no edema.  Neurological: She is alert and oriented to person, place, and time.  Skin: Skin is warm and dry. No rash noted. No erythema. No pallor.  Psychiatric: Her behavior is normal. Thought content normal.  Constricted affect, thoughts linear  Vitals reviewed.    Assessment & Plan:   1. Fatigue, unspecified type - CBC with Differential - Comprehensive metabolic panel - Urinalysis Dipstick negative  2. Depression with anxiety - Refill escitalopram (LEXAPRO) 20 MG tablet; Take 1 tablet (20 mg total) by mouth daily.  Dispense: 30 tablet; Refill: 5 - Begin hydrOXYzine (ATARAX/VISTARIL) 25 MG tablet; Take 1 tablet (25 mg total) by mouth 3 (three) times daily as needed.  Dispense: 30 tablet; Refill: 0  3. Insomnia, unspecified type - Begin hydrOXYzine (ATARAX/VISTARIL) 25 MG tablet; Take 1 tablet (25 mg total) by mouth 3 (three) times daily as needed.  Dispense: 30 tablet; Refill: 0  4. Suprapubic pressure - Urinalysis Dipstick negative   Meds ordered this encounter  Medications  . escitalopram (LEXAPRO) 20 MG tablet    Sig: Take 1 tablet (20 mg total) by mouth daily.    Dispense:  30 tablet    Refill:  5    Order Specific Question:   Supervising Provider    Answer:   Hoy Register [[id number]]  . hydrOXYzine (ATARAX/VISTARIL) 25 MG tablet    Sig: Take 1 tablet (25 mg total) by mouth 3  (three) times daily as needed.    Dispense:  30 tablet    Refill:  0    Order Specific Question:   Supervising Provider    Answer:   Hoy Register [[id number]]    Follow-up: Return in about 4 weeks (around 08/06/2018) for depression and insomnia.   Loletta Specter PA

## 2018-07-09 NOTE — Patient Instructions (Signed)

## 2018-07-10 ENCOUNTER — Telehealth (INDEPENDENT_AMBULATORY_CARE_PROVIDER_SITE_OTHER): Payer: Self-pay

## 2018-07-10 LAB — CBC WITH DIFFERENTIAL/PLATELET
BASOS ABS: 0 10*3/uL (ref 0.0–0.2)
BASOS: 1 %
EOS (ABSOLUTE): 0.5 10*3/uL — AB (ref 0.0–0.4)
Eos: 6 %
Hematocrit: 32.8 % — ABNORMAL LOW (ref 34.0–46.6)
Hemoglobin: 10.9 g/dL — ABNORMAL LOW (ref 11.1–15.9)
IMMATURE GRANS (ABS): 0 10*3/uL (ref 0.0–0.1)
IMMATURE GRANULOCYTES: 0 %
LYMPHS: 20 %
Lymphocytes Absolute: 1.8 10*3/uL (ref 0.7–3.1)
MCH: 30.7 pg (ref 26.6–33.0)
MCHC: 33.2 g/dL (ref 31.5–35.7)
MCV: 92 fL (ref 79–97)
Monocytes Absolute: 0.5 10*3/uL (ref 0.1–0.9)
Monocytes: 6 %
NEUTROS PCT: 67 %
Neutrophils Absolute: 5.9 10*3/uL (ref 1.4–7.0)
PLATELETS: 333 10*3/uL (ref 150–450)
RBC: 3.55 x10E6/uL — ABNORMAL LOW (ref 3.77–5.28)
RDW: 12.3 % (ref 12.3–15.4)
WBC: 8.8 10*3/uL (ref 3.4–10.8)

## 2018-07-10 LAB — COMPREHENSIVE METABOLIC PANEL
ALT: 18 IU/L (ref 0–32)
AST: 16 IU/L (ref 0–40)
Albumin/Globulin Ratio: 2 (ref 1.2–2.2)
Albumin: 4.5 g/dL (ref 3.5–5.5)
Alkaline Phosphatase: 157 IU/L — ABNORMAL HIGH (ref 39–117)
BUN/Creatinine Ratio: 22 (ref 9–23)
BUN: 27 mg/dL — ABNORMAL HIGH (ref 6–24)
Bilirubin Total: 0.2 mg/dL (ref 0.0–1.2)
CALCIUM: 9.6 mg/dL (ref 8.7–10.2)
CO2: 25 mmol/L (ref 20–29)
CREATININE: 1.25 mg/dL — AB (ref 0.57–1.00)
Chloride: 103 mmol/L (ref 96–106)
GFR calc Af Amer: 56 mL/min/{1.73_m2} — ABNORMAL LOW (ref >59)
GFR, EST NON AFRICAN AMERICAN: 49 mL/min/{1.73_m2} — AB (ref >59)
Globulin, Total: 2.2 g/dL (ref 1.5–4.5)
Glucose: 104 mg/dL — ABNORMAL HIGH (ref 65–99)
Potassium: 3.9 mmol/L (ref 3.5–5.2)
Sodium: 144 mmol/L (ref 134–144)
Total Protein: 6.7 g/dL (ref 6.0–8.5)

## 2018-07-10 NOTE — Telephone Encounter (Signed)
Patient is aware that she is slightly anemic and to purchase and take OTC iron pills. Chronic kidney disease stable. One of the liver enzymes is elevated; recommended a low fat diet and will recheck enzymes at next OV. Nat Christen, CMA

## 2018-07-10 NOTE — Telephone Encounter (Signed)
-----   Message from Clent Demark, PA-C sent at 07/10/2018 12:54 PM EDT ----- Slightly anemic, take OTC iron pills. Chronic kidney disease stable. One of the liver enzymes is elevated. I would recommend a low fat/fried diet. Recheck at next visit.

## 2018-07-20 ENCOUNTER — Other Ambulatory Visit (INDEPENDENT_AMBULATORY_CARE_PROVIDER_SITE_OTHER): Payer: Self-pay | Admitting: Physician Assistant

## 2018-07-20 DIAGNOSIS — G4700 Insomnia, unspecified: Secondary | ICD-10-CM

## 2018-07-20 DIAGNOSIS — H1013 Acute atopic conjunctivitis, bilateral: Secondary | ICD-10-CM

## 2018-07-20 DIAGNOSIS — F418 Other specified anxiety disorders: Secondary | ICD-10-CM

## 2018-07-20 NOTE — Telephone Encounter (Signed)
FWD to PCP. Draysen Weygandt S Tony Granquist, CMA  

## 2018-07-23 ENCOUNTER — Telehealth (HOSPITAL_COMMUNITY): Payer: Self-pay | Admitting: Pharmacist

## 2018-07-23 NOTE — Telephone Encounter (Signed)
Entresto 49-51 mg BID PA approved by [employer] Part D through 07/22/20.   Ruta Hinds. Velva Harman, PharmD, BCPS, CPP Clinical Pharmacist Phone: [phone] 07/23/2018 11:16 AM

## 2018-07-26 ENCOUNTER — Other Ambulatory Visit (INDEPENDENT_AMBULATORY_CARE_PROVIDER_SITE_OTHER): Payer: Self-pay | Admitting: Physician Assistant

## 2018-07-26 NOTE — Telephone Encounter (Signed)
FWD to PCP. Tempestt S Roberts, CMA  

## 2018-08-06 ENCOUNTER — Other Ambulatory Visit: Payer: Self-pay

## 2018-08-06 ENCOUNTER — Ambulatory Visit (INDEPENDENT_AMBULATORY_CARE_PROVIDER_SITE_OTHER): Payer: Medicare HMO | Admitting: Physician Assistant

## 2018-08-06 ENCOUNTER — Encounter (INDEPENDENT_AMBULATORY_CARE_PROVIDER_SITE_OTHER): Payer: Self-pay | Admitting: Physician Assistant

## 2018-08-06 VITALS — BP 93/65 | HR 81 | Temp 98.5°F | Ht 61.0 in | Wt 194.6 lb

## 2018-08-06 DIAGNOSIS — B372 Candidiasis of skin and nail: Secondary | ICD-10-CM

## 2018-08-06 DIAGNOSIS — Z1239 Encounter for other screening for malignant neoplasm of breast: Secondary | ICD-10-CM | POA: Diagnosis not present

## 2018-08-06 DIAGNOSIS — G4700 Insomnia, unspecified: Secondary | ICD-10-CM | POA: Diagnosis not present

## 2018-08-06 DIAGNOSIS — Z1211 Encounter for screening for malignant neoplasm of colon: Secondary | ICD-10-CM

## 2018-08-06 DIAGNOSIS — F418 Other specified anxiety disorders: Secondary | ICD-10-CM | POA: Diagnosis not present

## 2018-08-06 DIAGNOSIS — E119 Type 2 diabetes mellitus without complications: Secondary | ICD-10-CM

## 2018-08-06 MED ORDER — ZOLPIDEM TARTRATE 5 MG PO TABS: 5.0000 mg | ORAL_TABLET | Freq: Every evening | ORAL | 0 refills | Status: DC | PRN

## 2018-08-06 MED ORDER — KETOCONAZOLE 2 % EX CREA: 1.0000 "application " | TOPICAL_CREAM | Freq: Every day | CUTANEOUS | 0 refills | Status: DC

## 2018-08-06 NOTE — Patient Instructions (Signed)
Intertrigo Intertrigo is skin irritation (inflammation) that happens in warm, moist areas of the body. The irritation can cause a rash and make skin raw and itchy. The rash is usually pink or red. It happens mostly between folds of skin or where skin rubs together, such as:  Toes.  Armpits.  Groin.  Belly.  Breasts.  Buttocks.  This condition is not passed from person to person (is not contagious). Follow these instructions at home:  Keep the affected area clean and dry.  Do not scratch your skin.  Stay cool as much as possible. Use an air conditioner or fan, if you can.  Apply over-the-counter and prescription medicines only as told by your doctor.  If you were prescribed an antibiotic medicine, use it as told by your doctor. Do not stop using the antibiotic even if your condition starts to get better.  Keep all follow-up visits as told by your doctor. This is important. How is this prevented?  Stay at a healthy weight.  Keep your feet dry. This is very important if you have diabetes. Wear cotton or wool socks.  Take care of and protect the skin in your groin and butt area as told by your doctor.  Do not wear tight clothes. Wear clothes that: ? Are loose. ? Take away moisture from your body. ? Are made of cotton.  Wear a bra that gives good support, if needed.  Shower and dry yourself fully after being active.  Keep your blood sugar under control if you have diabetes. Contact a doctor if:  Your symptoms do not get better with treatment.  Your symptoms get worse or they spread.  You notice more redness and warmth.  You have a fever. This information is not intended to replace advice given to you by your health care provider. Make sure you discuss any questions you have with your health care provider. Document Released: 11/12/2010 Document Revised: 03/17/2016 Document Reviewed: 04/13/2015 Elsevier Interactive Patient Education  2018 Elsevier Inc.  

## 2018-08-06 NOTE — Progress Notes (Signed)
Subjective:  Patient ID: Stacey Lin, female    DOB: 04-14-1966  Age: 52 y.o. MRN: [id number]  CC: f/u depression and insomnia  HPI Stacey Lin a 52 y.o.femalewith a medical history ofdepression, anxiety,HTN, HLD, CHF, CKD3, Asthma, COPD,primary OA of right knee,Crack/Cocaine use,uterine fibroids,and anemia presents to f/u on depression, anxiety, and insomnia. Prescribed Lexapro 20 mg and Hydroxyzine 25 mg one month ago. States she is taking medications as directed. PHQ9 17 and GAD 7 10 one month ago. PHQ9 15 and GAD7 7 today. Still having much difficulty sleeping. Hydroxyzine was not effective. Does not know what may be keeping her up at night. Does not fear for her safety nor does she think about anything in particular.  Does not endorse suicidal ideation/intent. Does not endorse any other symptoms or complaints.   Outpatient Medications Prior to Visit  Medication Sig Dispense Refill  . allopurinol (ZYLOPRIM) 100 MG tablet Take 1 tablet (100 mg total) by mouth 2 (two) times daily. 60 tablet 5  . atorvastatin (LIPITOR) 40 MG tablet Take 1 tablet (40 mg total) by mouth daily. 90 tablet 3  . budesonide-formoterol (SYMBICORT) 80-4.5 MCG/ACT inhaler Take 2 puffs first thing in am and then another 2 puffs about 12 hours later. 1 Inhaler 11  . carvedilol (COREG) 3.125 MG tablet TAKE 1 TABLET BY MOUTH TWICE DAILY 60 tablet 5  . cetirizine (ZYRTEC) 10 MG tablet TAKE 1 TABLET BY MOUTH DAILY 30 tablet 0  . ENTRESTO 49-51 MG TAKE 1 TABLET BY MOUTH TWICE DAILY 60 tablet 5  . escitalopram (LEXAPRO) 20 MG tablet Take 1 tablet (20 mg total) by mouth daily. 30 tablet 5  . furosemide (LASIX) 40 MG tablet Take 1 tablet (40 mg total) by mouth 2 (two) times a week. Monday and Friday mornings. 30 tablet 3  . glimepiride (AMARYL) 2 MG tablet TAKE 1 TABLET BY MOUTH DAILY BEFORE BREAKFAST 30 tablet 5  . glucose blood test strip Use one strip TID 100 each 12  . hydrOXYzine (ATARAX/VISTARIL) 25 MG  tablet TAKE 1 TABLET BY MOUTH 3 TIMES DAILY AS NEEDED 30 tablet 0  . ketotifen (ALAWAY) 0.025 % ophthalmic solution Place 1 drop into both eyes 2 (two) times daily. 5 mL 0  . Lancets (ACCU-CHEK SOFT TOUCH) lancets Use one new lancet TID 100 each 12  . NARCAN 4 MG/0.1ML LIQD nasal spray kit NAR REP ALN  0  . spironolactone (ALDACTONE) 25 MG tablet TAKE 1 TABLET(25 MG) BY MOUTH DAILY 30 tablet 3  . acetaminophen (TYLENOL) 500 MG tablet Take 1,500 mg by mouth every 6 (six) hours as needed for headache (pain).     . BELBUCA 300 MCG FILM 1 FILM BID  0  . Blood Glucose Monitoring Suppl (ACCU-CHEK AVIVA PLUS) w/Device KIT 1 each by Does not apply route 3 (three) times daily. 1 kit 0  . Colchicine (MITIGARE) 0.6 MG CAPS Take 1 capsule by mouth daily. 30 capsule 0  . oxyCODONE-acetaminophen (PERCOCET/ROXICET) 5-325 MG tablet TK 1 T PO TID PRN  0   No facility-administered medications prior to visit.      ROS Review of Systems  Constitutional: Negative for chills, fever and malaise/fatigue.  Eyes: Negative for blurred vision.  Respiratory: Negative for shortness of breath.   Cardiovascular: Negative for chest pain and palpitations.  Gastrointestinal: Negative for abdominal pain and nausea.  Genitourinary: Negative for dysuria and hematuria.  Musculoskeletal: Negative for joint pain and myalgias.  Skin: Negative for rash.  Neurological: Negative for  tingling and headaches.  Psychiatric/Behavioral: Positive for depression. The patient is nervous/anxious and has insomnia.     Objective:  BP 93/65 (BP Location: Left Arm, Patient Position: Sitting, Cuff Size: Normal)   Pulse 81   Temp 98.5 F (36.9 C) (Oral)   Ht 5\' 1"  (1.549 m)   Wt 194 lb 9.6 oz (88.3 kg)   LMP  (LMP Unknown)   SpO2 95%   BMI 36.77 kg/m   BP/Weight 08/06/2018 07/09/2018 04/12/2018  Systolic BP 93 96 103  Diastolic BP 65 64 73  Wt. (Lbs) 194.6 191.2 175.6  BMI 36.77 36.13 33.18      Physical Exam  Constitutional:  She is oriented to person, place, and time.  Well developed, obese, NAD, polite  HENT:  Head: Normocephalic and atraumatic.  Eyes: No scleral icterus.  Neck: Normal range of motion. Neck supple. No thyromegaly present.  Cardiovascular: Normal rate, regular rhythm and normal heart sounds.  Pulmonary/Chest: Effort normal and breath sounds normal.  Musculoskeletal: She exhibits no edema.  Neurological: She is alert and oriented to person, place, and time.  Skin: Skin is warm and dry. No rash noted. No erythema. No pallor.  Psychiatric: She has a normal mood and affect. Her behavior is normal. Thought content normal.  Seems somewhat fatigued. Flat affect.   Vitals reviewed.    Assessment & Plan:     1. Depression with anxiety - Continue Lexapro.  - Stop hydroxyzine  2. Insomnia, unspecified type - Begin zolpidem (AMBIEN) 5 MG tablet; Take 1 tablet (5 mg total) by mouth at bedtime as needed for sleep.  Dispense: 30 tablet; Refill: 0 - Stop hydroxyzine   3. Candidal intertrigo - ketoconazole (NIZORAL) 2 % cream; Apply 1 application topically daily.  Dispense: 15 g; Refill: 0  4. Screening for breast cancer - MM DIGITAL SCREENING BILATERAL; Future  5. Special screening for malignant neoplasms, colon - Fecal occult blood, imunochemical; Future  6. Type 2 diabetes mellitus without complication, without long-term current use of insulin (HCC) - Microalbumin / creatinine urine ratio   Meds ordered this encounter  Medications  . ketoconazole (NIZORAL) 2 % cream    Sig: Apply 1 application topically daily.    Dispense:  15 g    Refill:  0    Order Specific Question:   Supervising Provider    Answer:   Hoy Register [[id number]]  . zolpidem (AMBIEN) 5 MG tablet    Sig: Take 1 tablet (5 mg total) by mouth at bedtime as needed for sleep.    Dispense:  30 tablet    Refill:  0    Order Specific Question:   Supervising Provider    Answer:   Hoy Register [[id number]]    Follow-up:  Return in about 6 weeks (around 09/17/2018) for depression with anxiety and insomnia.   Loletta Specter PA

## 2018-08-07 ENCOUNTER — Other Ambulatory Visit (INDEPENDENT_AMBULATORY_CARE_PROVIDER_SITE_OTHER): Payer: Self-pay | Admitting: Physician Assistant

## 2018-08-07 DIAGNOSIS — Z1231 Encounter for screening mammogram for malignant neoplasm of breast: Secondary | ICD-10-CM

## 2018-08-07 LAB — MICROALBUMIN / CREATININE URINE RATIO
Creatinine, Urine: 138.5 mg/dL
Microalb/Creat Ratio: <2.2 mg/g creat (ref 0.0–30.0)

## 2018-08-08 ENCOUNTER — Telehealth (INDEPENDENT_AMBULATORY_CARE_PROVIDER_SITE_OTHER): Payer: Self-pay

## 2018-08-08 NOTE — Telephone Encounter (Signed)
Patient is aware that urinary proteins are normal. Nat Christen, CMA

## 2018-08-08 NOTE — Telephone Encounter (Signed)
-----   Message from Clent Demark, PA-C sent at 08/08/2018 10:53 AM EDT ----- Normal urinary proteins.

## 2018-09-06 ENCOUNTER — Other Ambulatory Visit (INDEPENDENT_AMBULATORY_CARE_PROVIDER_SITE_OTHER): Payer: Self-pay | Admitting: Physician Assistant

## 2018-09-06 ENCOUNTER — Other Ambulatory Visit (HOSPITAL_COMMUNITY): Payer: Self-pay | Admitting: Adult Health

## 2018-09-06 ENCOUNTER — Telehealth (INDEPENDENT_AMBULATORY_CARE_PROVIDER_SITE_OTHER): Payer: Self-pay | Admitting: Physician Assistant

## 2018-09-06 DIAGNOSIS — G4700 Insomnia, unspecified: Secondary | ICD-10-CM

## 2018-09-06 DIAGNOSIS — Z760 Encounter for issue of repeat prescription: Secondary | ICD-10-CM

## 2018-09-06 MED ORDER — ALBUTEROL SULFATE HFA 108 (90 BASE) MCG/ACT IN AERS: 2.0000 | INHALATION_SPRAY | Freq: Four times a day (QID) | RESPIRATORY_TRACT | 5 refills | Status: DC | PRN

## 2018-09-06 NOTE — Telephone Encounter (Signed)
FWD to PCP. Tempestt S Roberts, CMA  

## 2018-09-06 NOTE — Telephone Encounter (Signed)
Patient called to request albuterol (PROVENTIL) (5 MG/ML) 0.5% nebulizer solution 5 mg   Patient uses [facility] [place], [place] AT [address]  Please advice [phone]  Thank you Emmit Pomfret

## 2018-09-06 NOTE — Telephone Encounter (Signed)
Good advise. Thank you.

## 2018-09-06 NOTE — Telephone Encounter (Signed)
FWD to PCP. Stacey Lin S Stacey Lin, CMA  

## 2018-09-06 NOTE — Telephone Encounter (Signed)
I have sent albuterol inhaler instead of nebulizer solution. She should be able to carry rescue inhaler wherever she goes.

## 2018-09-06 NOTE — Telephone Encounter (Signed)
Patient is aware that albuterol inhaler was sent as opposed to nebulizer solution. While on call patient began to complain that she has very severe headache and is nauseous. She stated her headache is so bad that she can not lift her head. Patient also stated she feels really bad. Advised patient to go to ED or UC as we are not scheduled to see her until 11/25. Nat Christen, CMA

## 2018-09-07 ENCOUNTER — Other Ambulatory Visit (INDEPENDENT_AMBULATORY_CARE_PROVIDER_SITE_OTHER): Payer: Self-pay | Admitting: Physician Assistant

## 2018-09-10 ENCOUNTER — Encounter (HOSPITAL_COMMUNITY): Payer: Self-pay | Admitting: Emergency Medicine

## 2018-09-10 ENCOUNTER — Emergency Department (HOSPITAL_COMMUNITY): Payer: Medicare HMO

## 2018-09-10 ENCOUNTER — Emergency Department (HOSPITAL_COMMUNITY)
Admission: EM | Admit: 2018-09-10 | Discharge: 2018-09-10 | Disposition: A | Discharge status: Home / Self Care | Payer: Medicare HMO | Attending: Emergency Medicine | Admitting: Emergency Medicine

## 2018-09-10 DIAGNOSIS — R51 Headache: Secondary | ICD-10-CM | POA: Insufficient documentation

## 2018-09-10 DIAGNOSIS — J449 Chronic obstructive pulmonary disease, unspecified: Secondary | ICD-10-CM | POA: Insufficient documentation

## 2018-09-10 DIAGNOSIS — I5022 Chronic systolic (congestive) heart failure: Secondary | ICD-10-CM | POA: Insufficient documentation

## 2018-09-10 DIAGNOSIS — Z720 Tobacco use: Secondary | ICD-10-CM | POA: Insufficient documentation

## 2018-09-10 DIAGNOSIS — R519 Headache, unspecified: Secondary | ICD-10-CM

## 2018-09-10 DIAGNOSIS — I110 Hypertensive heart disease with heart failure: Secondary | ICD-10-CM | POA: Diagnosis not present

## 2018-09-10 DIAGNOSIS — J45909 Unspecified asthma, uncomplicated: Secondary | ICD-10-CM | POA: Insufficient documentation

## 2018-09-10 LAB — CBC WITH DIFFERENTIAL/PLATELET
Abs Immature Granulocytes: 0.03 10*3/uL (ref 0.00–0.07)
BASOS ABS: 0 10*3/uL (ref 0.0–0.1)
Basophils Relative: 0 %
EOS ABS: 0.5 10*3/uL (ref 0.0–0.5)
EOS PCT: 5 %
HCT: 37.2 % (ref 36.0–46.0)
HEMOGLOBIN: 11.1 g/dL — AB (ref 12.0–15.0)
Immature Granulocytes: 0 %
Lymphocytes Relative: 19 %
Lymphs Abs: 1.7 10*3/uL (ref 0.7–4.0)
MCH: 29 pg (ref 26.0–34.0)
MCHC: 29.8 g/dL — AB (ref 30.0–36.0)
MCV: 97.1 fL (ref 80.0–100.0)
MONO ABS: 0.6 10*3/uL (ref 0.1–1.0)
Monocytes Relative: 6 %
NRBC: 0 % (ref 0.0–0.2)
Neutro Abs: 6.1 10*3/uL (ref 1.7–7.7)
Neutrophils Relative %: 70 %
Platelets: 272 10*3/uL (ref 150–400)
RBC: 3.83 MIL/uL — AB (ref 3.87–5.11)
RDW: 12.8 % (ref 11.5–15.5)
WBC: 8.9 10*3/uL (ref 4.0–10.5)

## 2018-09-10 LAB — BASIC METABOLIC PANEL
Anion gap: 7 (ref 5–15)
BUN: 19 mg/dL (ref 6–20)
CALCIUM: 9.3 mg/dL (ref 8.9–10.3)
CHLORIDE: 109 mmol/L (ref 98–111)
CO2: 26 mmol/L (ref 22–32)
Creatinine, Ser: 1.12 mg/dL — ABNORMAL HIGH (ref 0.44–1.00)
GFR calc non Af Amer: 55 mL/min — ABNORMAL LOW (ref >60)
Glucose, Bld: 129 mg/dL — ABNORMAL HIGH (ref 70–99)
Potassium: 3.8 mmol/L (ref 3.5–5.1)
SODIUM: 142 mmol/L (ref 135–145)

## 2018-09-10 MED ORDER — KETOROLAC TROMETHAMINE 15 MG/ML IJ SOLN
15.0000 mg | Freq: Once | INTRAMUSCULAR | Status: AC
  Administered 2018-09-10: 15 mg via INTRAVENOUS
  Filled 2018-09-10: qty 1

## 2018-09-10 MED ORDER — METOCLOPRAMIDE HCL 5 MG/ML IJ SOLN
10.0000 mg | Freq: Once | INTRAMUSCULAR | Status: AC
  Administered 2018-09-10: 10 mg via INTRAVENOUS
  Filled 2018-09-10: qty 2

## 2018-09-10 MED ORDER — DIPHENHYDRAMINE HCL 50 MG/ML IJ SOLN
12.5000 mg | Freq: Once | INTRAMUSCULAR | Status: AC
  Administered 2018-09-10: 12.5 mg via INTRAVENOUS
  Filled 2018-09-10: qty 1

## 2018-09-10 MED ORDER — SODIUM CHLORIDE 0.9 % IV BOLUS
1000.0000 mL | Freq: Once | INTRAVENOUS | Status: AC
  Administered 2018-09-10: 1000 mL via INTRAVENOUS

## 2018-09-10 NOTE — ED Provider Notes (Signed)
[facility] DEPARTMENT Provider Note   CSN: [id number] Arrival date & time: 09/10/18  1207     History   Chief Complaint Chief Complaint  Patient presents with  . Headache    HPI Stacey Lin is a 52 y.o. female with past medical history significant for chronic headaches, CHF, COPD, hypertension who presents for evaluation of headache.  Patient states headache started approximately 1 week ago.  Pain onset was gradual in nature.  Denies thunderclap headache.  Patient states that she has taken ibuprofen without relief of symptoms.  Patient admits to nausea and photophobia.  Denies phonophobia.  Patient states this feels similar to her previous headaches.  Denies vision changes, facial asymmetry, eye pain, dizziness, weakness, numbness and tingling in her extremities, slurred speech, chest pain, shortness of breath, abdominal pain, dysuria or diarrhea. Admits to recent increase in headaches. Rates her head pain a 9/10. Denies neck stiffness, or neck pain.  Denies aggravating or alleviating factors.  Denies head trauma, head injury or use of anticoagulation.  History obtained from patient.  No interpreter is used.  HPI  Past Medical History:  Diagnosis Date  . Anemia   . Anginal pain ([place])    2 months  . Arrhythmia   . Asthma   . CHF (congestive heart failure) ([place])    1990s  . Chronic headaches   . COPD (chronic obstructive pulmonary disease) ([place])    ??  . Hypercholesteremia   . Hypertension     Patient Active Problem List   Diagnosis Date Noted  . Dyspnea 04/14/2016  . Streptococcus pneumoniae pneumonia ([place]) 04/07/2016  . Acute respiratory failure with hypoxia and hypercarbia ([place]) 04/03/2016  . Elevated lactic acid level 04/03/2016  . Leukocytosis 04/03/2016  . New onset type 2 diabetes mellitus ([place]) 04/03/2016  . Abnormal urinalysis 04/03/2016  . Trichomonas infection 04/03/2016  . Non compliance w medication regimen 01/12/2016  .  AKI (acute kidney injury) ([place]) 03/14/2014  . Chronic systolic HF (heart failure) ([place]) 03/14/2014  . Musculoskeletal chest pain 03/14/2014  . Periodic health assessment, general screening, adult 10/03/2013  . Mitral valve regurgitation 08/22/2013  . FIBROIDS, UTERUS 05/11/2010  . ANEMIA, SECONDARY TO BLOOD LOSS 05/11/2010  . Asthmatic bronchitis 05/11/2010  . TOBACCO USER 07/07/2009  . Essential hypertension, benign 06/19/2009  . MENORRHAGIA 06/19/2009  . COCAINE ABUSE, HX OF 06/19/2009    Past Surgical History:  Procedure Laterality Date  . LEFT AND RIGHT HEART CATHETERIZATION WITH CORONARY ANGIOGRAM N/A 06/06/2014   Procedure: LEFT AND RIGHT HEART CATHETERIZATION WITH CORONARY ANGIOGRAM;  Surgeon: Jolaine Artist, MD;  Location: [facility] CATH LAB;  Service: Cardiovascular;  Laterality: N/A;  . MULTIPLE EXTRACTIONS WITH ALVEOLOPLASTY Bilateral 07/24/2015   Procedure: MULTIPLE EXTRACTIONS WITH ALVEOLOPLASTY,  BILATERAL TORI ;  Surgeon: Diona Browner, DDS;  Location: [facility];  Service: Oral Surgery;  Laterality: Bilateral;  . TUBAL LIGATION  1986     OB History   None      Home Medications    Prior to Admission medications   Medication Sig Start Date End Date Taking? Authorizing Provider  acetaminophen (TYLENOL) 500 MG tablet Take 1,500 mg by mouth every 6 (six) hours as needed for headache (pain).    Yes [provider]  ENTRESTO 49-51 MG TAKE 1 TABLET BY MOUTH TWICE DAILY 06/29/18  Yes Bensimhon, Shaune Pascal, MD  albuterol (PROVENTIL HFA;VENTOLIN HFA) 108 (90 Base) MCG/ACT inhaler Inhale 2 puffs into the lungs every 6 (six) hours as needed  for wheezing or shortness of breath. 09/06/18   Clent Demark, PA-C  allopurinol (ZYLOPRIM) 100 MG tablet Take 1 tablet (100 mg total) by mouth 2 (two) times daily. 04/12/18   Clent Demark, PA-C  atorvastatin (LIPITOR) 40 MG tablet Take 1 tablet (40 mg total) by mouth daily. 01/10/18   Clent Demark, PA-C  BELBUCA 300 MCG FILM 1  FILM BID 05/16/18   [provider]  Blood Glucose Monitoring Suppl (ACCU-CHEK AVIVA PLUS) w/Device KIT 1 each by Does not apply route 3 (three) times daily. 01/09/18   Clent Demark, PA-C  budesonide-formoterol [facility]) 80-4.5 MCG/ACT inhaler Take 2 puffs first thing in am and then another 2 puffs about 12 hours later. 04/14/16   Tanda Rockers, MD  carvedilol (COREG) 3.125 MG tablet TAKE 1 TABLET BY MOUTH TWICE DAILY 06/29/18   Bensimhon, Shaune Pascal, MD  cetirizine (ZYRTEC) 10 MG tablet TAKE 1 TABLET BY MOUTH DAILY 07/20/18   Clent Demark, PA-C  Colchicine (MITIGARE) 0.6 MG CAPS Take 1 capsule by mouth daily. 04/12/18   Clent Demark, PA-C  escitalopram (LEXAPRO) 20 MG tablet Take 1 tablet (20 mg total) by mouth daily. 07/09/18   Clent Demark, PA-C  furosemide (LASIX) 40 MG tablet Take 1 tablet (40 mg total) by mouth 2 (two) times a week. Monday and Friday mornings. 04/12/18   [name], Amy D, NP  glimepiride (AMARYL) 2 MG tablet TAKE 1 TABLET BY MOUTH DAILY BEFORE BREAKFAST 07/26/18   Clent Demark, PA-C  glucose blood test strip Use one strip TID 03/11/18   Gildardo Pounds, NP  hydrOXYzine (ATARAX/VISTARIL) 25 MG tablet TAKE 1 TABLET BY MOUTH 3 TIMES DAILY AS NEEDED 07/20/18   Clent Demark, PA-C  ketoconazole (NIZORAL) 2 % cream Apply 1 application topically daily. 08/06/18   Clent Demark, PA-C  ketotifen (ALAWAY) 0.025 % ophthalmic solution Place 1 drop into both eyes 2 (two) times daily. 02/22/18   Clent Demark, PA-C  Lancets [facility]) lancets Use one new lancet TID 03/11/18   Gildardo Pounds, NP  [facility] 4 MG/0.1ML LIQD nasal spray kit NAR REP ALN 02/26/18   [provider]  oxyCODONE-acetaminophen (PERCOCET/ROXICET) 5-325 MG tablet TK 1 T PO TID PRN 08/02/18   [provider]  spironolactone (ALDACTONE) 25 MG tablet TAKE 1 TABLET(25 MG) BY MOUTH DAILY 09/07/18   [name], Amy D, NP  zolpidem (AMBIEN) 5 MG tablet Take 1 tablet  (5 mg total) by mouth at bedtime as needed for sleep. 08/06/18   Clent Demark, PA-C    Family History Family History  Problem Relation Age of Onset  . Hypertension Mother   . Allergies Mother   . Asthma Mother   . Heart disease Mother   . Stomach cancer Mother        Deceased, 85  . Seizures Mother   . Hypertension Father        Deceased  . Hypertension Maternal Grandmother   . Healthy Brother   . Healthy Son   . Healthy Daughter     Social History Social History   Tobacco Use  . Smoking status: Former Smoker    Packs/day: 0.10    Years: 20.00    Pack years: 2.00    Types: Cigarettes    Last attempt to quit: 10/04/2017    Years since quitting: 0.9  . Smokeless tobacco: Never Used  . Tobacco comment: Smokes occasionally when someone has a  cig to have, doesn't buy them.  Substance Use Topics  . Alcohol use: No    Alcohol/week: 0.0 standard drinks  . Drug use: Not Currently    Frequency: 4.0 times per week    Types: "Crack" cocaine    Comment: RELAPSE 12/2016     Allergies   Patient has no known allergies.   Review of Systems Review of Systems  Constitutional: Negative.   HENT: Negative.   Respiratory: Negative.   Cardiovascular: Negative.   Gastrointestinal: Negative.   Genitourinary: Negative.   Musculoskeletal: Negative.   Skin: Negative.   Neurological: Positive for headaches. Negative for dizziness, tremors, seizures, syncope, facial asymmetry, speech difficulty, weakness, light-headedness and numbness.  All other systems reviewed and are negative.    Physical Exam Updated Vital Signs BP 97/63 (BP Location: Right Arm)   Pulse 66   Temp 98.3 F (36.8 C) (Oral)   Resp 13   Ht 5' (1.524 m)   Wt 74.8 kg   LMP  (LMP Unknown)   SpO2 95%   BMI 32.22 kg/m   Physical Exam  Physical Exam  Constitutional: Pt is oriented to person, place, and time. Pt appears well-developed and well-nourished. No distress.  HENT:  Head: Normocephalic and  atraumatic.  Mouth/Throat: Oropharynx is clear and moist.  Eyes: Conjunctivae and EOM are normal. Pupils are equal, round, and reactive to light. No scleral icterus.  No horizontal, vertical or rotational nystagmus  Neck: Normal range of motion. Neck supple.  Full active and passive ROM without pain No midline or paraspinal tenderness No nuchal rigidity or meningeal signs  Cardiovascular: Normal rate, regular rhythm and intact distal pulses.   Pulmonary/Chest: Effort normal and breath sounds normal. No respiratory distress. Pt has no wheezes. No rales.  Abdominal: Soft. Bowel sounds are normal. There is no tenderness. There is no rebound and no guarding.  Musculoskeletal: Normal range of motion.  Lymphadenopathy:    No cervical adenopathy.  Neurological: Pt. is alert and oriented to person, place, and time. He has normal reflexes. No cranial nerve deficit.  Exhibits normal muscle tone. Coordination normal.  Mental Status:  Alert, oriented, thought content appropriate. Speech fluent without evidence of aphasia. Able to follow 2 step commands without difficulty.  Cranial Nerves:  II:  Peripheral visual fields grossly normal, pupils equal, round, reactive to light III,IV, VI: ptosis not present, extra-ocular motions intact bilaterally  V,VII: smile symmetric, facial light touch sensation equal VIII: hearing grossly normal bilaterally  IX,X: midline uvula rise  XI: bilateral shoulder shrug equal and strong XII: midline tongue extension  Motor:  5/5 in upper and lower extremities bilaterally including strong and equal grip strength and dorsiflexion/plantar flexion Sensory: Pinprick and light touch normal in all extremities.  Deep Tendon Reflexes: 2+ and symmetric  Cerebellar: normal finger-to-nose with bilateral upper extremities.  Difficulty with heel to shin on right lower extremity secondary to knee pain with flexion. Gait: normal gait and balance CV: distal pulses palpable throughout     Skin: Skin is warm and dry. No rash noted. Pt is not diaphoretic.  Psychiatric: Pt has a normal mood and affect. Behavior is normal. Judgment and thought content normal.  Nursing note and vitals reviewed. ED Treatments / Results  Labs (all labs ordered are listed, but only abnormal results are displayed) Labs Reviewed  CBC WITH DIFFERENTIAL/PLATELET - Abnormal; Notable for the following components:      Result Value   RBC 3.83 (*)    Hemoglobin 11.1 (*)  MCHC 29.8 (*)    All other components within normal limits  BASIC METABOLIC PANEL - Abnormal; Notable for the following components:   Glucose, Bld 129 (*)    Creatinine, Ser 1.12 (*)    GFR calc non Af Amer 55 (*)    All other components within normal limits    EKG None  Radiology Ct Head Wo Contrast  Result Date: 09/10/2018 CLINICAL DATA:  52 year old female with headache x4 days, nausea vomiting. EXAM: CT HEAD WITHOUT CONTRAST TECHNIQUE: Contiguous axial images were obtained from the base of the skull through the vertex without intravenous contrast. COMPARISON:  None. FINDINGS: Brain: Normal cerebral volume. No midline shift, ventriculomegaly, mass effect, evidence of mass lesion, intracranial hemorrhage or evidence of cortically based acute infarction. Gray-white matter differentiation is within normal limits throughout the brain. Vascular: Mild Calcified atherosclerosis at the right MCA M1 segment. No suspicious intracranial vascular hyperdensity. Skull: Negative. Sinuses/Orbits: Paranasal sinuses and mastoids are well pneumatized. Other: Visualized orbit soft tissues are within normal limits. Visualized scalp soft tissues are within normal limits. IMPRESSION: Negative noncontrast head CT. Electronically Signed   By: Genevie Ann M.D.   On: 09/10/2018 13:57    Procedures Procedures (including critical care time)  Medications Ordered in ED Medications  metoCLOPramide (REGLAN) injection 10 mg (10 mg Intravenous Given 09/10/18  1459)  diphenhydrAMINE (BENADRYL) injection 12.5 mg (12.5 mg Intravenous Given 09/10/18 1459)  sodium chloride 0.9 % bolus 1,000 mL (0 mLs Intravenous Stopped 09/10/18 1640)  ketorolac (TORADOL) 15 MG/ML injection 15 mg (15 mg Intravenous Given 09/10/18 1559)     Initial Impression / Assessment and Plan / ED Course  I have reviewed the triage vital signs and the nursing notes.  Pertinent labs & imaging results that were available during my care of the patient were reviewed by me and considered in my medical decision making (see chart for details).  52 year old appears otherwise well presents for evaluation of headache.  Headache onset approximately 4 days ago.  Denies head trauma or head injury.  Has history of chronic headaches.  Headache gradual in onset.  Denies thunderclap headache or worst headache of life.  Normal neurologic exam without neurologic deficits.  Denies history of fever, vision changes, neck pain or neck stiffness.  She states this headache feels like her vehicle headache.  Given recent history of increase in headaches will obtain CT and provide pain management and reevaluate.  On reevaluation patient's pain resolved with medications in the department.  Patient requesting DC home at this time. Presentation is like pts typical HA and non concerning for [facility], ICH, Meningitis, or temporal arteritis. Pt is afebrile with no focal neuro deficits, nuchal rigidity, or change in vision. Pt is to follow up with PCP to discuss prophylactic medication.  Patient is hemodynamically stable and appropriate for DC home at this time.  Discussed with patient strict return precautions.  Patient voiced understanding is agreeable for follow-up.    Final Clinical Impressions(s) / ED Diagnoses   Final diagnoses:  Acute nonintractable headache, unspecified headache type    ED Discharge Orders    None       Israel Werts A, PA-C 09/10/18 1703    Hayden Rasmussen, MD 09/10/18 Bosie Helper

## 2018-09-10 NOTE — Discharge Instructions (Signed)
You were evaluated today for headache.  Your CT scan was negative. You states your headache was resolved with the medication were gave you in the department. Please help with your PCP for reevaluation.  Return to the ED for any worsening symptoms

## 2018-09-10 NOTE — ED Triage Notes (Signed)
Pt from home, lives with daughter-in-law, c/o headache x 4 days.  A&O x4.

## 2018-09-12 ENCOUNTER — Ambulatory Visit
Admission: RE | Admit: 2018-09-12 | Discharge: 2018-09-12 | Disposition: A | Discharge status: Home / Self Care | Payer: 59 | Source: Ambulatory Visit | Attending: Physician Assistant | Admitting: Physician Assistant

## 2018-09-12 DIAGNOSIS — Z1231 Encounter for screening mammogram for malignant neoplasm of breast: Secondary | ICD-10-CM

## 2018-09-17 ENCOUNTER — Encounter (INDEPENDENT_AMBULATORY_CARE_PROVIDER_SITE_OTHER): Payer: Self-pay | Admitting: Physician Assistant

## 2018-09-17 ENCOUNTER — Ambulatory Visit (INDEPENDENT_AMBULATORY_CARE_PROVIDER_SITE_OTHER): Payer: Medicare HMO | Admitting: Physician Assistant

## 2018-09-17 VITALS — BP 96/66 | HR 73 | Temp 98.5°F | Resp 18 | Ht 60.0 in | Wt 198.0 lb

## 2018-09-17 DIAGNOSIS — Z1211 Encounter for screening for malignant neoplasm of colon: Secondary | ICD-10-CM | POA: Diagnosis not present

## 2018-09-17 DIAGNOSIS — F418 Other specified anxiety disorders: Secondary | ICD-10-CM | POA: Diagnosis not present

## 2018-09-17 DIAGNOSIS — R110 Nausea: Secondary | ICD-10-CM | POA: Diagnosis not present

## 2018-09-17 DIAGNOSIS — G4700 Insomnia, unspecified: Secondary | ICD-10-CM

## 2018-09-17 LAB — POCT URINALYSIS DIPSTICK
BILIRUBIN TOTAL: NEGATIVE
Blood, UA: NEGATIVE
GLUCOSE UA: NEGATIVE
KETONES UA: NEGATIVE
Leukocytes, UA: NEGATIVE
Nitrite, UA: NEGATIVE
PROTEIN UA: NEGATIVE
Spec Grav, UA: <=1.005 — AB (ref 1.010–1.025)
Urobilinogen, UA: 0.2 E.U./dL
pH, UA: 5 (ref 5.0–8.0)

## 2018-09-17 MED ORDER — ZOLPIDEM TARTRATE 5 MG PO TABS: 5.0000 mg | ORAL_TABLET | Freq: Every evening | ORAL | 0 refills | Status: DC | PRN

## 2018-09-17 NOTE — Progress Notes (Signed)
Subjective:  Patient ID: Stacey Lin, female    DOB: 08/17/1966  Age: 52 y.o. MRN: [id number]  CC: f/u depression with anxiety  HPI Stacey Lin a 52 y.o.femalewith a medical history ofdepression, anxiety,HTN, HLD, CHF, CKD3, Asthma, COPD,primary OA of right knee,Crack/Cocaine use,uterine fibroids,and anemia presents to f/u on depression, anxiety, and insomnia. Patient was advised to continue Lexapro and to stop hydroxyzine. Zolpidem 5 mg prescribed for insomnia which has been extremely helpful when she was taking it. She had run out of all her medications for over a week and began to feel very ill. Mainly with headache, dizziness, and nausea with vomiting. Went to ED and was worked up to include a non contrast CT head which was negative. Has recently begun to take all her medications again. Still has some nausea. Mostly concerned about why she felt so ill. PHQ9 15 and GAD7 7 nearly six weeks ago. PHQ9 20 and GAD7 10 today. She is reportedly sober for many months now.    Outpatient Medications Prior to Visit  Medication Sig Dispense Refill  . acetaminophen (TYLENOL) 500 MG tablet Take 1,500 mg by mouth every 6 (six) hours as needed for headache (pain).     Marland Kitchen albuterol (PROVENTIL HFA;VENTOLIN HFA) 108 (90 Base) MCG/ACT inhaler Inhale 2 puffs into the lungs every 6 (six) hours as needed for wheezing or shortness of breath. 1 Inhaler 5  . allopurinol (ZYLOPRIM) 100 MG tablet Take 1 tablet (100 mg total) by mouth 2 (two) times daily. 60 tablet 5  . atorvastatin (LIPITOR) 40 MG tablet Take 1 tablet (40 mg total) by mouth daily. 90 tablet 3  . BELBUCA 300 MCG FILM 1 FILM BID  0  . Blood Glucose Monitoring Suppl (ACCU-CHEK AVIVA PLUS) w/Device KIT 1 each by Does not apply route 3 (three) times daily. 1 kit 0  . budesonide-formoterol (SYMBICORT) 80-4.5 MCG/ACT inhaler Take 2 puffs first thing in am and then another 2 puffs about 12 hours later. 1 Inhaler 11  . carvedilol (COREG) 3.125  MG tablet TAKE 1 TABLET BY MOUTH TWICE DAILY 60 tablet 5  . cetirizine (ZYRTEC) 10 MG tablet TAKE 1 TABLET BY MOUTH DAILY 30 tablet 0  . Colchicine (MITIGARE) 0.6 MG CAPS Take 1 capsule by mouth daily. 30 capsule 0  . ENTRESTO 49-51 MG TAKE 1 TABLET BY MOUTH TWICE DAILY 60 tablet 5  . escitalopram (LEXAPRO) 20 MG tablet Take 1 tablet (20 mg total) by mouth daily. 30 tablet 5  . furosemide (LASIX) 40 MG tablet Take 1 tablet (40 mg total) by mouth 2 (two) times a week. Monday and Friday mornings. 30 tablet 3  . glimepiride (AMARYL) 2 MG tablet TAKE 1 TABLET BY MOUTH DAILY BEFORE BREAKFAST 30 tablet 5  . glucose blood test strip Use one strip TID 100 each 12  . hydrOXYzine (ATARAX/VISTARIL) 25 MG tablet TAKE 1 TABLET BY MOUTH 3 TIMES DAILY AS NEEDED 30 tablet 0  . ketoconazole (NIZORAL) 2 % cream Apply 1 application topically daily. 15 g 0  . ketotifen (ALAWAY) 0.025 % ophthalmic solution Place 1 drop into both eyes 2 (two) times daily. 5 mL 0  . Lancets (ACCU-CHEK SOFT TOUCH) lancets Use one new lancet TID 100 each 12  . NARCAN 4 MG/0.1ML LIQD nasal spray kit NAR REP ALN  0  . oxyCODONE-acetaminophen (PERCOCET/ROXICET) 5-325 MG tablet TK 1 T PO TID PRN  0  . spironolactone (ALDACTONE) 25 MG tablet TAKE 1 TABLET(25 MG) BY MOUTH DAILY 30 tablet  5  . zolpidem (AMBIEN) 5 MG tablet Take 1 tablet (5 mg total) by mouth at bedtime as needed for sleep. 30 tablet 0   No facility-administered medications prior to visit.      ROS Review of Systems  Constitutional: Negative for chills, fever and malaise/fatigue.  Eyes: Negative for blurred vision.  Respiratory: Negative for shortness of breath.   Cardiovascular: Negative for chest pain and palpitations.  Gastrointestinal: Negative for abdominal pain and nausea.  Genitourinary: Negative for dysuria and hematuria.  Musculoskeletal: Negative for joint pain and myalgias.  Skin: Negative for rash.  Neurological: Negative for tingling and headaches.   Psychiatric/Behavioral: Negative for depression. The patient is not nervous/anxious.     Objective:  LMP  (LMP Unknown)   BP/Weight 09/10/2018 08/06/2018 07/09/2018  Systolic BP 97 93 96  Diastolic BP 63 65 64  Wt. (Lbs) 165 194.6 191.2  BMI 32.22 36.77 36.13      Physical Exam  Constitutional: She is oriented to person, place, and time.  Well developed, well nourished, NAD, polite  HENT:  Head: Normocephalic and atraumatic.  Eyes: No scleral icterus.  Neck: Normal range of motion. Neck supple. No thyromegaly present.  Cardiovascular: Normal rate, regular rhythm and normal heart sounds.  Pulmonary/Chest: Effort normal and breath sounds normal.  Musculoskeletal: She exhibits no edema.  Neurological: She is alert and oriented to person, place, and time.  Skin: Skin is warm and dry. No rash noted. No erythema. No pallor.  Psychiatric: Her behavior is normal. Thought content normal.  Depressed, tearful  Vitals reviewed.    Assessment & Plan:    1. Depression with anxiety - Ambulatory referral to Psychiatry - Pt has recently refilled her escitalopram and I have advised her to take as directed without any abrupt discontinuation. Pt likely felt "ill" because of withdrawal symptoms from her medications. She was visibly relieved to know she felt ill due withdrawal symptoms. Work up thus far has revealed normal results to include various labs and general screening mammogram.  2. Insomnia, unspecified type - Refill zolpidem (AMBIEN) 5 MG tablet; Take 1 tablet (5 mg total) by mouth at bedtime as needed for sleep.  Dispense: 30 tablet; Refill: 0  3. Nausea - Urinalysis Dipstick normal - CBC with Differential - Comprehensive metabolic panel  4. Screening for colon cancer - Fecal occult blood, immunochemical collected in clinic today.    Meds ordered this encounter  Medications  . zolpidem (AMBIEN) 5 MG tablet    Sig: Take 1 tablet (5 mg total) by mouth at bedtime as needed  for sleep.    Dispense:  30 tablet    Refill:  0    Order Specific Question:   Supervising Provider    Answer:   Hoy Register [[id number]]    Follow-up: Return in about 4 weeks (around 10/15/2018).   Loletta Specter PA

## 2018-09-17 NOTE — Patient Instructions (Signed)
Generalized Anxiety Disorder, Adult Generalized anxiety disorder (GAD) is a mental health disorder. People with this condition constantly worry about everyday events. Unlike normal anxiety, worry related to GAD is not triggered by a specific event. These worries also do not fade or get better with time. GAD interferes with life functions, including relationships, work, and school. GAD can vary from mild to severe. People with severe GAD can have intense waves of anxiety with physical symptoms (panic attacks). What are the causes? The exact cause of GAD is not known. What increases the risk? This condition is more likely to develop in:  Women.  People who have a family history of anxiety disorders.  People who are very shy.  People who experience very stressful life events, such as the death of a loved one.  People who have a very stressful family environment.  What are the signs or symptoms? People with GAD often worry excessively about many things in their lives, such as their health and family. They may also be overly concerned about:  Doing well at work.  Being on time.  Natural disasters.  Friendships.  Physical symptoms of GAD include:  Fatigue.  Muscle tension or having muscle twitches.  Trembling or feeling shaky.  Being easily startled.  Feeling like your heart is pounding or racing.  Feeling out of breath or like you cannot take a deep breath.  Having trouble falling asleep or staying asleep.  Sweating.  Nausea, diarrhea, or irritable bowel syndrome (IBS).  Headaches.  Trouble concentrating or remembering facts.  Restlessness.  Irritability.  How is this diagnosed? Your health care provider can diagnose GAD based on your symptoms and medical history. You will also have a physical exam. The health care provider will ask specific questions about your symptoms, including how severe they are, when they started, and if they come and go. Your health care  provider may ask you about your use of alcohol or drugs, including prescription medicines. Your health care provider may refer you to a mental health specialist for further evaluation. Your health care provider will do a thorough examination and may perform additional tests to rule out other possible causes of your symptoms. To be diagnosed with GAD, a person must have anxiety that:  Is out of his or her control.  Affects several different aspects of his or her life, such as work and relationships.  Causes distress that makes him or her unable to take part in normal activities.  Includes at least three physical symptoms of GAD, such as restlessness, fatigue, trouble concentrating, irritability, muscle tension, or sleep problems.  Before your health care provider can confirm a diagnosis of GAD, these symptoms must be present more days than they are not, and they must last for six months or longer. How is this treated? The following therapies are usually used to treat GAD:  Medicine. Antidepressant medicine is usually prescribed for long-term daily control. Antianxiety medicines may be added in severe cases, especially when panic attacks occur.  Talk therapy (psychotherapy). Certain types of talk therapy can be helpful in treating GAD by providing support, education, and guidance. Options include: ? Cognitive behavioral therapy (CBT). People learn coping skills and techniques to ease their anxiety. They learn to identify unrealistic or negative thoughts and behaviors and to replace them with positive ones. ? Acceptance and commitment therapy (ACT). This treatment teaches people how to be mindful as a way to cope with unwanted thoughts and feelings. ? Biofeedback. This process trains you to   manage your body's response (physiological response) through breathing techniques and relaxation methods. You will work with a therapist while machines are used to monitor your physical symptoms.  Stress  management techniques. These include yoga, meditation, and exercise.  A mental health specialist can help determine which treatment is best for you. Some people see improvement with one type of therapy. However, other people require a combination of therapies. Follow these instructions at home:  Take over-the-counter and prescription medicines only as told by your health care provider.  Try to maintain a normal routine.  Try to anticipate stressful situations and allow extra time to manage them.  Practice any stress management or self-calming techniques as taught by your health care provider.  Do not punish yourself for setbacks or for not making progress.  Try to recognize your accomplishments, even if they are small.  Keep all follow-up visits as told by your health care provider. This is important. Contact a health care provider if:  Your symptoms do not get better.  Your symptoms get worse.  You have signs of depression, such as: ? A persistently sad, cranky, or irritable mood. ? Loss of enjoyment in activities that used to bring you joy. ? Change in weight or eating. ? Changes in sleeping habits. ? Avoiding friends or family members. ? Loss of energy for normal tasks. ? Feelings of guilt or worthlessness. Get help right away if:  You have serious thoughts about hurting yourself or others. If you ever feel like you may hurt yourself or others, or have thoughts about taking your own life, get help right away. You can go to your nearest emergency department or call:  Your local emergency services (911 in the [place]  A suicide crisis helpline, such as the [place] at (773)725-7961. This is open 24 hours a day.  Summary  Generalized anxiety disorder (GAD) is a mental health disorder that involves worry that is not triggered by a specific event.  People with GAD often worry excessively about many things in their lives, such as their health and  family.  GAD may cause physical symptoms such as restlessness, trouble concentrating, sleep problems, frequent sweating, nausea, diarrhea, headaches, and trembling or muscle twitching.  A mental health specialist can help determine which treatment is best for you. Some people see improvement with one type of therapy. However, other people require a combination of therapies. This information is not intended to replace advice given to you by your health care provider. Make sure you discuss any questions you have with your health care provider. Document Released: 02/04/2013 Document Revised: 08/30/2016 Document Reviewed: 08/30/2016 Elsevier Interactive Patient Education  2018 [employer]. Major Depressive Disorder, Adult Major depressive disorder (MDD) is a mental health condition. MDD often makes you feel sad, hopeless, or helpless. MDD can also cause symptoms in your body. MDD can affect your:  Work.  School.  Relationships.  Other normal activities.  MDD can range from mild to very bad. It may occur once (single episode MDD). It can also occur many times (recurrent MDD). The main symptoms of MDD often include:  Feeling sad, depressed, or irritable most of the time.  Loss of interest.  MDD symptoms also include:  Sleeping too much or too little.  Eating too much or too little.  A change in your weight.  Feeling tired (fatigue) or having low energy.  Feeling worthless.  Feeling guilty.  Trouble making decisions.  Trouble thinking clearly.  Thoughts of suicide or harming  others.  Feeling weak.  Feeling agitated.  Keeping yourself from being around other people (isolation).  Follow these instructions at home: Activity  Do these things as told by your doctor: ? Go back to your normal activities. ? Exercise regularly. ? Spend time outdoors. Alcohol  Talk with your doctor about how alcohol can affect your antidepressant medicines.  Do not drink alcohol. Or,  limit how much alcohol you drink. ? This means no more than 1 drink a day for nonpregnant women and 2 drinks a day for men. One drink equals one of these:  12 oz of beer.  5 oz of wine.  1 oz of hard liquor. General instructions  Take over-the-counter and prescription medicines only as told by your doctor.  Eat a healthy diet.  Get plenty of sleep.  Find activities that you enjoy. Make time to do them.  Think about joining a support group. Your doctor may be able to suggest a group for you.  Keep all follow-up visits as told by your doctor. This is important. Where to find more information:  [employer] on Mental Illness: ? www.nami.[place]: ? https://carter.com/  National Suicide Prevention Lifeline: ? 279 440 3959. This is free, 24-hour help. Contact a doctor if:  Your symptoms get worse.  You have new symptoms. Get help right away if:  You self-harm.  You see, hear, taste, smell, or feel things that are not present (hallucinate). If you ever feel like you may hurt yourself or others, or have thoughts about taking your own life, get help right away. You can go to your nearest emergency department or call:  Your local emergency services (911 in the [place]  A suicide crisis helpline, such as the National Suicide Prevention Lifeline: ? 818 618 9075. This is open 24 hours a day.  This information is not intended to replace advice given to you by your health care provider. Make sure you discuss any questions you have with your health care provider. Document Released: 09/21/2015 Document Revised: 06/26/2016 Document Reviewed: 06/26/2016 Elsevier Interactive Patient Education  2017 [employer].

## 2018-09-18 LAB — COMPREHENSIVE METABOLIC PANEL
A/G RATIO: 1.7 (ref 1.2–2.2)
ALK PHOS: 182 IU/L — AB (ref 39–117)
ALT: 24 IU/L (ref 0–32)
AST: 20 IU/L (ref 0–40)
Albumin: 4.6 g/dL (ref 3.5–5.5)
BILIRUBIN TOTAL: 0.3 mg/dL (ref 0.0–1.2)
BUN/Creatinine Ratio: 19 (ref 9–23)
BUN: 22 mg/dL (ref 6–24)
CHLORIDE: 99 mmol/L (ref 96–106)
CO2: 20 mmol/L (ref 20–29)
Calcium: 10.1 mg/dL (ref 8.7–10.2)
Creatinine, Ser: 1.14 mg/dL — ABNORMAL HIGH (ref 0.57–1.00)
GFR calc Af Amer: 63 mL/min/{1.73_m2} (ref >59)
GFR calc non Af Amer: 55 mL/min/{1.73_m2} — ABNORMAL LOW (ref >59)
GLUCOSE: 124 mg/dL — AB (ref 65–99)
Globulin, Total: 2.7 g/dL (ref 1.5–4.5)
POTASSIUM: 4.1 mmol/L (ref 3.5–5.2)
Sodium: 140 mmol/L (ref 134–144)
Total Protein: 7.3 g/dL (ref 6.0–8.5)

## 2018-09-18 LAB — CBC WITH DIFFERENTIAL/PLATELET
BASOS ABS: 0 10*3/uL (ref 0.0–0.2)
Basos: 1 %
EOS (ABSOLUTE): 0.5 10*3/uL — AB (ref 0.0–0.4)
Eos: 6 %
HEMOGLOBIN: 11.9 g/dL (ref 11.1–15.9)
Hematocrit: 36.1 % (ref 34.0–46.6)
Immature Grans (Abs): 0 10*3/uL (ref 0.0–0.1)
Immature Granulocytes: 0 %
LYMPHS ABS: 1.7 10*3/uL (ref 0.7–3.1)
Lymphs: 19 %
MCH: 30 pg (ref 26.6–33.0)
MCHC: 33 g/dL (ref 31.5–35.7)
MCV: 91 fL (ref 79–97)
MONOCYTES: 7 %
Monocytes Absolute: 0.6 10*3/uL (ref 0.1–0.9)
Neutrophils Absolute: 5.7 10*3/uL (ref 1.4–7.0)
Neutrophils: 67 %
PLATELETS: 355 10*3/uL (ref 150–450)
RBC: 3.97 x10E6/uL (ref 3.77–5.28)
RDW: 12.2 % — AB (ref 12.3–15.4)
WBC: 8.6 10*3/uL (ref 3.4–10.8)

## 2018-09-19 ENCOUNTER — Telehealth (INDEPENDENT_AMBULATORY_CARE_PROVIDER_SITE_OTHER): Payer: Self-pay

## 2018-09-19 ENCOUNTER — Other Ambulatory Visit (INDEPENDENT_AMBULATORY_CARE_PROVIDER_SITE_OTHER): Payer: Self-pay

## 2018-09-19 ENCOUNTER — Other Ambulatory Visit (INDEPENDENT_AMBULATORY_CARE_PROVIDER_SITE_OTHER): Payer: Self-pay | Admitting: Physician Assistant

## 2018-09-19 DIAGNOSIS — R748 Abnormal levels of other serum enzymes: Secondary | ICD-10-CM

## 2018-09-19 LAB — FECAL OCCULT BLOOD, IMMUNOCHEMICAL: FECAL OCCULT BLD: NEGATIVE

## 2018-09-19 NOTE — Telephone Encounter (Signed)
Left message asking patient to return call to [facility] at [phone]. Tempestt S Roberts, CMA  

## 2018-09-19 NOTE — Telephone Encounter (Signed)
-----   Message from Clent Demark, PA-C sent at 09/19/2018 12:14 PM EST ----- FIT negative.

## 2018-09-19 NOTE — Telephone Encounter (Signed)
-----   Message from Clent Demark, PA-C sent at 09/19/2018  8:34 AM EST ----- Labs normal except ALP. I have ordered a test to help find out if ALP elevated due to liver, bone, or other.

## 2018-09-19 NOTE — Telephone Encounter (Signed)
Patient is aware that FIT is negative. Nat Christen, CMA

## 2018-09-19 NOTE — Telephone Encounter (Signed)
Patient returned call and was notified that labs normal except elevated ALP. Need to have additional blood work to determine if ALP is elevated due to liver, bone or other. Patient will come in today to have blood drawn. Nat Christen, CMA

## 2018-09-21 LAB — ALKALINE PHOSPHATASE, ISOENZYMES
Alkaline Phosphatase: 142 IU/L — ABNORMAL HIGH (ref 39–117)
BONE FRACTION: 24 % (ref 14–68)
INTESTINAL FRAC.: 0 % (ref 0–18)
LIVER FRACTION: 76 % (ref 18–85)

## 2018-09-27 ENCOUNTER — Telehealth (INDEPENDENT_AMBULATORY_CARE_PROVIDER_SITE_OTHER): Payer: Self-pay

## 2018-09-27 NOTE — Telephone Encounter (Signed)
Patient is aware that liver, bone and intestinal fractions are all normal. ALP elevation may likely be due to blood type and nonfasting sample. Nat Christen, CMA

## 2018-09-27 NOTE — Telephone Encounter (Signed)
-----   Message from Clent Demark, PA-C sent at 09/24/2018  1:31 PM EST ----- Liver, bone, and intestinal fractions are normal. ALP elevation may likely be due to blood type and nonfasting sample.

## 2018-10-04 LAB — HM DIABETES EYE EXAM

## 2018-10-11 ENCOUNTER — Other Ambulatory Visit (INDEPENDENT_AMBULATORY_CARE_PROVIDER_SITE_OTHER): Payer: Self-pay | Admitting: Physician Assistant

## 2018-10-11 DIAGNOSIS — G4700 Insomnia, unspecified: Secondary | ICD-10-CM

## 2018-10-11 DIAGNOSIS — R0602 Shortness of breath: Secondary | ICD-10-CM

## 2018-10-11 NOTE — Telephone Encounter (Signed)
FWD to PCP. Saivon Prowse S Chardonnay Holzmann, CMA  

## 2018-10-15 ENCOUNTER — Ambulatory Visit (INDEPENDENT_AMBULATORY_CARE_PROVIDER_SITE_OTHER): Payer: Medicare HMO | Admitting: Nurse Practitioner

## 2018-10-30 ENCOUNTER — Encounter (INDEPENDENT_AMBULATORY_CARE_PROVIDER_SITE_OTHER): Payer: Self-pay | Admitting: Physician Assistant

## 2018-10-30 ENCOUNTER — Ambulatory Visit (INDEPENDENT_AMBULATORY_CARE_PROVIDER_SITE_OTHER): Payer: Medicare HMO | Admitting: Physician Assistant

## 2018-10-30 ENCOUNTER — Other Ambulatory Visit: Payer: Self-pay

## 2018-10-30 ENCOUNTER — Other Ambulatory Visit (INDEPENDENT_AMBULATORY_CARE_PROVIDER_SITE_OTHER): Payer: Self-pay | Admitting: Physician Assistant

## 2018-10-30 VITALS — BP 107/73 | HR 73 | Temp 97.8°F | Ht 60.0 in | Wt 197.0 lb

## 2018-10-30 DIAGNOSIS — M109 Gout, unspecified: Secondary | ICD-10-CM

## 2018-10-30 DIAGNOSIS — G4700 Insomnia, unspecified: Secondary | ICD-10-CM | POA: Diagnosis not present

## 2018-10-30 DIAGNOSIS — F418 Other specified anxiety disorders: Secondary | ICD-10-CM | POA: Diagnosis not present

## 2018-10-30 DIAGNOSIS — E119 Type 2 diabetes mellitus without complications: Secondary | ICD-10-CM

## 2018-10-30 DIAGNOSIS — E7841 Elevated Lipoprotein(a): Secondary | ICD-10-CM

## 2018-10-30 DIAGNOSIS — Z760 Encounter for issue of repeat prescription: Secondary | ICD-10-CM | POA: Diagnosis not present

## 2018-10-30 DIAGNOSIS — H1013 Acute atopic conjunctivitis, bilateral: Secondary | ICD-10-CM

## 2018-10-30 LAB — POCT GLYCOSYLATED HEMOGLOBIN (HGB A1C): HEMOGLOBIN A1C: 6.6 % — AB (ref 4.0–5.6)

## 2018-10-30 MED ORDER — HYDROXYZINE HCL 25 MG PO TABS: 25.0000 mg | ORAL_TABLET | Freq: Every evening | ORAL | 0 refills | Status: DC | PRN

## 2018-10-30 MED ORDER — KETOCONAZOLE 2 % EX CREA: 1.0000 "application " | TOPICAL_CREAM | Freq: Every day | CUTANEOUS | 0 refills | Status: DC

## 2018-10-30 MED ORDER — ALLOPURINOL 100 MG PO TABS: 100.0000 mg | ORAL_TABLET | Freq: Two times a day (BID) | ORAL | 5 refills | Status: DC

## 2018-10-30 MED ORDER — ZOLPIDEM TARTRATE 5 MG PO TABS: 5.0000 mg | ORAL_TABLET | Freq: Every evening | ORAL | 0 refills | Status: DC | PRN

## 2018-10-30 NOTE — Patient Instructions (Signed)
Diabetes Mellitus and Nutrition, Adult  When you have diabetes (diabetes mellitus), it is very important to have healthy eating habits because your blood sugar (glucose) levels are greatly affected by what you eat and drink. Eating healthy foods in the appropriate amounts, at about the same times every day, can help you:  · Control your blood glucose.  · Lower your risk of heart disease.  · Improve your blood pressure.  · Reach or maintain a healthy weight.  Every person with diabetes is different, and each person has different needs for a meal plan. Your health care provider may recommend that you work with a diet and nutrition specialist (dietitian) to make a meal plan that is best for you. Your meal plan may vary depending on factors such as:  · The calories you need.  · The medicines you take.  · Your weight.  · Your blood glucose, blood pressure, and cholesterol levels.  · Your activity level.  · Other health conditions you have, such as heart or kidney disease.  How do carbohydrates affect me?  Carbohydrates, also called carbs, affect your blood glucose level more than any other type of food. Eating carbs naturally raises the amount of glucose in your blood. Carb counting is a method for keeping track of how many carbs you eat. Counting carbs is important to keep your blood glucose at a healthy level, especially if you use insulin or take certain oral diabetes medicines.  It is important to know how many carbs you can safely have in each meal. This is different for every person. Your dietitian can help you calculate how many carbs you should have at each meal and for each snack.  Foods that contain carbs include:  · Bread, cereal, rice, pasta, and crackers.  · Potatoes and corn.  · Peas, beans, and lentils.  · Milk and yogurt.  · Fruit and juice.  · Desserts, such as cakes, cookies, ice cream, and candy.  How does alcohol affect me?  Alcohol can cause a sudden decrease in blood glucose (hypoglycemia),  especially if you use insulin or take certain oral diabetes medicines. Hypoglycemia can be a life-threatening condition. Symptoms of hypoglycemia (sleepiness, dizziness, and confusion) are similar to symptoms of having too much alcohol.  If your health care provider says that alcohol is safe for you, follow these guidelines:  · Limit alcohol intake to no more than 1 drink per day for nonpregnant women and 2 drinks per day for men. One drink equals 12 oz of beer, 5 oz of wine, or 1½ oz of hard liquor.  · Do not drink on an empty stomach.  · Keep yourself hydrated with water, diet soda, or unsweetened iced tea.  · Keep in mind that regular soda, juice, and other mixers may contain a lot of sugar and must be counted as carbs.  What are tips for following this plan?    Reading food labels  · Start by checking the serving size on the "Nutrition Facts" label of packaged foods and drinks. The amount of calories, carbs, fats, and other nutrients listed on the label is based on one serving of the item. Many items contain more than one serving per package.  · Check the total grams (g) of carbs in one serving. You can calculate the number of servings of carbs in one serving by dividing the total carbs by 15. For example, if a food has 30 g of total carbs, it would be equal to 2   servings of carbs.  · Check the number of grams (g) of saturated and trans fats in one serving. Choose foods that have low or no amount of these fats.  · Check the number of milligrams (mg) of salt (sodium) in one serving. Most people should limit total sodium intake to less than 2,300 mg per day.  · Always check the nutrition information of foods labeled as "low-fat" or "nonfat". These foods may be higher in added sugar or refined carbs and should be avoided.  · Talk to your dietitian to identify your daily goals for nutrients listed on the label.  Shopping  · Avoid buying canned, premade, or processed foods. These foods tend to be high in fat, sodium,  and added sugar.  · Shop around the outside edge of the grocery store. This includes fresh fruits and vegetables, bulk grains, fresh meats, and fresh dairy.  Cooking  · Use low-heat cooking methods, such as baking, instead of high-heat cooking methods like deep frying.  · Cook using healthy oils, such as olive, canola, or sunflower oil.  · Avoid cooking with butter, cream, or high-fat meats.  Meal planning  · Eat meals and snacks regularly, preferably at the same times every day. Avoid going long periods of time without eating.  · Eat foods high in fiber, such as fresh fruits, vegetables, beans, and whole grains. Talk to your dietitian about how many servings of carbs you can eat at each meal.  · Eat 4-6 ounces (oz) of lean protein each day, such as lean meat, chicken, fish, eggs, or tofu. One oz of lean protein is equal to:  ? 1 oz of meat, chicken, or fish.  ? 1 egg.  ? ¼ cup of tofu.  · Eat some foods each day that contain healthy fats, such as avocado, nuts, seeds, and fish.  Lifestyle  · Check your blood glucose regularly.  · Exercise regularly as told by your health care provider. This may include:  ? 150 minutes of moderate-intensity or vigorous-intensity exercise each week. This could be brisk walking, biking, or water aerobics.  ? Stretching and doing strength exercises, such as yoga or weightlifting, at least 2 times a week.  · Take medicines as told by your health care provider.  · Do not use any products that contain nicotine or tobacco, such as cigarettes and e-cigarettes. If you need help quitting, ask your health care provider.  · Work with a counselor or diabetes educator to identify strategies to manage stress and any emotional and social challenges.  Questions to ask a health care provider  · Do I need to meet with a diabetes educator?  · Do I need to meet with a dietitian?  · What number can I call if I have questions?  · When are the best times to check my blood glucose?  Where to find more  information:  · American Diabetes Association: diabetes.org  · Academy of Nutrition and Dietetics: www.eatright.org  · National Institute of Diabetes and Digestive and Kidney Diseases (NIH): www.niddk.nih.gov  Summary  · A healthy meal plan will help you control your blood glucose and maintain a healthy lifestyle.  · Working with a diet and nutrition specialist (dietitian) can help you make a meal plan that is best for you.  · Keep in mind that carbohydrates (carbs) and alcohol have immediate effects on your blood glucose levels. It is important to count carbs and to use alcohol carefully.  This information is not intended to   replace advice given to you by your health care provider. Make sure you discuss any questions you have with your health care provider.  Document Released: 07/07/2005 Document Revised: 05/10/2017 Document Reviewed: 11/14/2016  Elsevier Interactive Patient Education © 2019 [employer]

## 2018-10-30 NOTE — Progress Notes (Signed)
Subjective:  Patient ID: Stacey Lin, female    DOB: [birth year]  Age: 53 y.o. MRN: [id number]  CC: DM and depression with anxiety  HPI Stacey Lin a 53 y.o.femalewith a medical history ofdepression, anxiety,HTN, HLD, CHF, CKD3, Asthma, DM2, COPD,primary OA of right knee,Crack/Cocaine use,uterine fibroids,and anemia presentsto f/u on depression, anxiety, and DM2. Last PHQ9 20 and GAD7 10 six weeks ago. Had abruptly stopped her Lexapro on her own accord. Advised to take medications as directed. Referred to Psychiatry but has not heard anything back. Says she has been using Lexpro 20 mg and feels her depression is somewhat better. However, pt still reports depression. Also reports seeing figures out of the corner of her eyes or scurrying past her. Feels as if she is being watched. She had seen these figures before when she used crack/cocaine. Has been sober and denies using illegal substances.  Only sees these figures in her house and no where else. No other occupant of the house reports seeing these figures. Continues to have insomnia now that she is out of Ambien. Ambien has been useful in keeping her asleep the full night. Does not wake up and snack which she says is detrimental to her DM2. Does not endorse any other symptoms or complaints at this time.     Outpatient Medications Prior to Visit  Medication Sig Dispense Refill  . acetaminophen (TYLENOL) 500 MG tablet Take 1,500 mg by mouth every 6 (six) hours as needed for headache (pain).     Marland Kitchen albuterol (PROVENTIL HFA;VENTOLIN HFA) 108 (90 Base) MCG/ACT inhaler Inhale 2 puffs into the lungs every 6 (six) hours as needed for wheezing or shortness of breath. 1 Inhaler 5  . allopurinol (ZYLOPRIM) 100 MG tablet Take 1 tablet (100 mg total) by mouth 2 (two) times daily. 60 tablet 5  . atorvastatin (LIPITOR) 40 MG tablet Take 1 tablet (40 mg total) by mouth daily. 90 tablet 3  . Blood Glucose Monitoring Suppl (ACCU-CHEK AVIVA PLUS)  w/Device KIT 1 each by Does not apply route 3 (three) times daily. 1 kit 0  . budesonide-formoterol (SYMBICORT) 80-4.5 MCG/ACT inhaler Take 2 puffs first thing in am and then another 2 puffs about 12 hours later. 1 Inhaler 11  . carvedilol (COREG) 3.125 MG tablet TAKE 1 TABLET BY MOUTH TWICE DAILY 60 tablet 5  . cetirizine (ZYRTEC) 10 MG tablet TAKE 1 TABLET BY MOUTH DAILY 30 tablet 0  . Colchicine (MITIGARE) 0.6 MG CAPS Take 1 capsule by mouth daily. 30 capsule 0  . ENTRESTO 49-51 MG TAKE 1 TABLET BY MOUTH TWICE DAILY 60 tablet 5  . escitalopram (LEXAPRO) 20 MG tablet Take 1 tablet (20 mg total) by mouth daily. 30 tablet 5  . furosemide (LASIX) 40 MG tablet Take 1 tablet (40 mg total) by mouth 2 (two) times a week. Monday and Friday mornings. 30 tablet 3  . glimepiride (AMARYL) 2 MG tablet TAKE 1 TABLET BY MOUTH DAILY BEFORE BREAKFAST 30 tablet 5  . glucose blood test strip Use one strip TID 100 each 12  . hydrOXYzine (ATARAX/VISTARIL) 25 MG tablet TAKE 1 TABLET BY MOUTH 3 TIMES DAILY AS NEEDED 30 tablet 0  . ketoconazole (NIZORAL) 2 % cream Apply 1 application topically daily. 15 g 0  . ketotifen (ALAWAY) 0.025 % ophthalmic solution Place 1 drop into both eyes 2 (two) times daily. 5 mL 0  . Lancets (ACCU-CHEK SOFT TOUCH) lancets Use one new lancet TID 100 each 12  . NARCAN 4 MG/0.1ML  LIQD nasal spray kit NAR REP ALN  0  . oxyCODONE-acetaminophen (PERCOCET/ROXICET) 5-325 MG tablet TK 1 T PO TID PRN  0  . spironolactone (ALDACTONE) 25 MG tablet TAKE 1 TABLET(25 MG) BY MOUTH DAILY 30 tablet 5  . zolpidem (AMBIEN) 5 MG tablet TAKE 1 TABLET BY MOUTH AT BEDTIME AS NEEDED FOR SLEEP 30 tablet 0   No facility-administered medications prior to visit.      ROS Review of Systems  Constitutional: Negative for chills, fever and malaise/fatigue.  Eyes: Negative for blurred vision.  Respiratory: Negative for shortness of breath.   Cardiovascular: Negative for chest pain and palpitations.   Gastrointestinal: Negative for abdominal pain and nausea.  Genitourinary: Negative for dysuria and hematuria.  Musculoskeletal: Negative for joint pain and myalgias.  Skin: Negative for rash.  Neurological: Negative for tingling and headaches.  Psychiatric/Behavioral: Positive for depression, hallucinations and memory loss. Negative for substance abuse and suicidal ideas. The patient has insomnia. The patient is not nervous/anxious.     Objective:  BP 107/73 (BP Location: Left Arm, Patient Position: Sitting, Cuff Size: Normal)   Pulse 73   Temp 97.8 F (36.6 C) (Oral)   Ht 5' (1.524 m)   Wt 197 lb (89.4 kg)   LMP  (LMP Unknown)   SpO2 90%   BMI 38.47 kg/m   BP/Weight 10/30/2018 09/17/2018 09/10/2018  Systolic BP 107 96 97  Diastolic BP 73 66 63  Wt. (Lbs) 197 198 165  BMI 38.47 38.67 32.22      Physical Exam Vitals signs reviewed.  Constitutional:      Comments: Well developed, well nourished, NAD, polite  HENT:     Head: Normocephalic and atraumatic.  Eyes:     General: No scleral icterus. Neck:     Musculoskeletal: Normal range of motion and neck supple.     Thyroid: No thyromegaly.  Cardiovascular:     Rate and Rhythm: Normal rate and regular rhythm.     Heart sounds: Normal heart sounds.  Pulmonary:     Effort: Pulmonary effort is normal.     Breath sounds: Normal breath sounds.  Skin:    General: Skin is warm and dry.     Coloration: Skin is not pale.     Findings: No erythema or rash.  Neurological:     Mental Status: She is alert and oriented to person, place, and time.  Psychiatric:        Behavior: Behavior normal.        Thought Content: Thought content normal.     Comments: Tearful at times during encounter. Poor judgment and insight. Thoughts nontangential. Normal rate of speech. Constricted affect.       Assessment & Plan:   1. Depression with anxiety - hydrOXYzine (ATARAX/VISTARIL) 25 MG tablet; Take 1 tablet (25 mg total) by mouth at  bedtime as needed.  Dispense: 30 tablet; Refill: 0 - Continue Lexapro 20 mg.  - I have reassured patient about her hallucinations and advised she speak both to her psychiatrist and to her pastor. May have to consider neurological evaluation.  - Pt has been referred to psychiatry but has not been contacted nor do referral notes reflect any further information. I have sent a staff message to referral specialist to find out when patient could be scheduled.   2. Type 2 diabetes mellitus without complication, without long-term current use of insulin (HCC) - HgB A1c 6.6% increased from 6.0%. Attributed to some dietary indiscretion during the holiday season.  3. Insomnia, unspecified type - zolpidem (AMBIEN) 5 MG tablet; Take 1 tablet (5 mg total) by mouth at bedtime as needed. for sleep  Dispense: 30 tablet; Refill: 0 - hydrOXYzine (ATARAX/VISTARIL) 25 MG tablet; Take 1 tablet (25 mg total) by mouth at bedtime as needed.  Dispense: 30 tablet; Refill: 0  4. Medication refill - allopurinol (ZYLOPRIM) 100 MG tablet; Take 1 tablet (100 mg total) by mouth 2 (two) times daily.  Dispense: 60 tablet; Refill: 5 - ketoconazole (NIZORAL) 2 % cream; Apply 1 application topically daily.  Dispense: 30 g; Refill: 0   Meds ordered this encounter  Medications  . zolpidem (AMBIEN) 5 MG tablet    Sig: Take 1 tablet (5 mg total) by mouth at bedtime as needed. for sleep    Dispense:  30 tablet    Refill:  0    Order Specific Question:   Supervising Provider    Answer:   Hoy Register [[id number]]  . hydrOXYzine (ATARAX/VISTARIL) 25 MG tablet    Sig: Take 1 tablet (25 mg total) by mouth at bedtime as needed.    Dispense:  30 tablet    Refill:  0    Order Specific Question:   Supervising Provider    Answer:   Hoy Register [[id number]]  . allopurinol (ZYLOPRIM) 100 MG tablet    Sig: Take 1 tablet (100 mg total) by mouth 2 (two) times daily.    Dispense:  60 tablet    Refill:  5    Order Specific Question:    Supervising Provider    Answer:   Hoy Register [[id number]]  . ketoconazole (NIZORAL) 2 % cream    Sig: Apply 1 application topically daily.    Dispense:  30 g    Refill:  0    Order Specific Question:   Supervising Provider    Answer:   Hoy Register [[id number]]    Follow-up: Return in about 4 weeks (around 11/27/2018) for depression with anxiety.   Loletta Specter PA

## 2018-11-06 ENCOUNTER — Ambulatory Visit (HOSPITAL_COMMUNITY): Payer: Self-pay | Admitting: Psychiatry

## 2018-11-19 ENCOUNTER — Ambulatory Visit (HOSPITAL_COMMUNITY): Payer: Self-pay | Admitting: Psychiatry

## 2018-11-20 ENCOUNTER — Encounter: Payer: Self-pay | Admitting: Internal Medicine

## 2018-11-20 ENCOUNTER — Ambulatory Visit (INDEPENDENT_AMBULATORY_CARE_PROVIDER_SITE_OTHER)
Admission: RE | Admit: 2018-11-20 | Discharge: 2018-11-20 | Disposition: A | Discharge status: Home / Self Care | Payer: Medicare HMO | Source: Ambulatory Visit | Attending: Internal Medicine | Admitting: Internal Medicine

## 2018-11-20 ENCOUNTER — Ambulatory Visit (INDEPENDENT_AMBULATORY_CARE_PROVIDER_SITE_OTHER): Payer: Medicare HMO | Admitting: Internal Medicine

## 2018-11-20 VITALS — BP 96/70 | HR 82 | Ht 60.0 in | Wt 201.4 lb

## 2018-11-20 DIAGNOSIS — J4541 Moderate persistent asthma with (acute) exacerbation: Secondary | ICD-10-CM | POA: Diagnosis not present

## 2018-11-20 DIAGNOSIS — F17210 Nicotine dependence, cigarettes, uncomplicated: Secondary | ICD-10-CM

## 2018-11-20 DIAGNOSIS — J4540 Moderate persistent asthma, uncomplicated: Secondary | ICD-10-CM | POA: Insufficient documentation

## 2018-11-20 MED ORDER — PREDNISONE 10 MG PO TABS: ORAL_TABLET | ORAL | 0 refills | Status: DC

## 2018-11-20 MED ORDER — BUDESONIDE-FORMOTEROL FUMARATE 80-4.5 MCG/ACT IN AERO: 2.0000 | INHALATION_SPRAY | Freq: Two times a day (BID) | RESPIRATORY_TRACT | 0 refills | Status: DC

## 2018-11-20 NOTE — Patient Instructions (Addendum)
Plan A = Automatic = symbicort 80 Take 2 puffs first thing in am and then another 2 puffs about 12 hours later.   Work on inhaler technique:  relax and gently blow all the way out then take a nice smooth deep breath back in, triggering the inhaler at same time you start breathing in.  Hold for up to 5 seconds if you can. Blow out thru nose. Rinse and gargle with water when done     The key is to stop smoking completely before smoking completely stops you!  For smoking cessation classes call [phone]      Plan B = Backup Only use your albuterol (ventolin) inhaler as a rescue medication to be used if you can't catch your breath by resting or doing a relaxed purse lip breathing pattern.  - The less you use it, the better it will work when you need it. - Ok to use the inhaler up to 2 puffs  every 4 hours if you must but call for appointment if use goes up over your usual need - Don't leave home without it !!  (think of it like the spare tire for your car)   Please remember to go to the  x-ray department  for your tests - we will call you with the results when they are available     Please schedule a follow up office visit in 2  weeks, call sooner if needed with all medications /inhalers/ solutions in hand so we can verify exactly what you are taking. This includes all medications from all doctors and over the [place] separate them into two bags:  the ones you take automatically, no matter what, vs the ones you take just when you feel you need them "BAG #2 is UP TO YOU"  - this will really help [place] help you take your medications more effectively.  - add: Prednisone 10 mg take  4 each am x 2 days,   2 each am x 2 days,  1 each am x 2 days and stop and spirometry on return

## 2018-11-20 NOTE — Progress Notes (Signed)
Subjective:    Patient ID: Stacey Lin, female    DOB: 1966/05/06   MRN: [id number]   Brief patient profile:  13 yobf active smoker previously seen by Dr. Shelle Lin 12/18/14 with dyspnea on exertion.She was felt to have asthmatic bronchitis but not COPD  rec Stop spiriva rx breo 100, take one inhalation each am proair respiclick, 2 inhalations every 6 hrs only if you are having severe breathing issues. Work on weight loss and conditioning. Will send a note to your cardiologist to see about getting you off lisinopril to see if this helps your dry tickling cough. You must stop smoking 100% in order to stay well.  followup with me again in 2mos to check on things> did not return.   History of Present Illness  02/12/2016: NP Follow up Office Visit: Patient presents to the office today with worsening dyspnea on exertion. She was seen at the heart failure clinic one day prior to OV   and they requested that she follow up with pulmonary. Spirometry 11/2014 showed  normal FEV1 with ? air trapping given her   FVC ? 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 She is continuing to smoke approx. 2 cigarettes daily per her history.    she has not been using her Breo inhaler daily as  Maintenance medication, but just when needed.   rec We will give you a Breo sample and new prescription for your Breo.( Maintenance inhaler) Use the Breo 1 puff twice daily. We will give you a prescription for your Pro Air Inhaler ( Rescue Inhaler) Use this every 6 hours as needed for SOB or Wheezing. Claritin ( loratadine) 1 every day for allergies Nasal Saline as needed for nasal congestion Continue weighing yourself daily per the heart failure clinic Follow up in 1 month > did  Not return        11/20/2018  Acute  ov/Stacey Lin re: worse since stopped symbicort  Chief Complaint  Patient presents with  . Acute Visit    Increased SOB for the past 2 months. She gets winded walking from room to room at home. She also c/o occ  cough and wheezing. Her cough is mainly non prod.  She is using her albuterol inhaler 2 x per wk on average.   Dyspnea:  Gradually worse x 2 months to point to doe  Room to room = [facility]  Cough: acutely worse x 2 days / harsh esp daytime > noct  Sleeping: on back with 2 pillows SABA use: confused with which ones help and when to take  02: none    No obvious day to day or daytime variability or assoc excess/ purulent sputum or mucus plugs or hemoptysis or cp or chest tightness,  or overt sinus or hb symptoms.   Sleeping  without nocturnal   exacerbation  of respiratory  c/o's or need for noct saba. Also denies any obvious fluctuation of symptoms with weather or environmental changes or other aggravating or alleviating factors except as outlined above   No unusual exposure hx or h/o childhood pna/ asthma or knowledge of premature birth.  Current Allergies, Complete Past Medical History, Past Surgical History, Family History, and Social History were reviewed in [employer] record.  ROS  The following are not active complaints unless bolded Hoarseness, sore throat, dysphagia, dental problems, itching, sneezing,  nasal congestion or discharge of excess mucus or purulent secretions, ear ache,   fever, chills, sweats, unintended wt loss or wt gain, classically pleuritic  or exertional cp,  orthopnea pnd or arm/hand swelling  or leg swelling, presyncope, palpitations, abdominal pain, anorexia, nausea, vomiting, diarrhea  or change in bowel habits or change in bladder habits, change in stools or change in urine, dysuria, hematuria,  rash, arthralgias, visual complaints, headache, numbness, weakness or ataxia or problems with walking or coordination,  change in mood or  memory.        Current Meds  Medication Sig  . albuterol (PROVENTIL HFA;VENTOLIN HFA) 108 (90 Base) MCG/ACT inhaler Inhale 2 puffs into the lungs every 6 (six) hours as needed for wheezing or shortness of breath.  .  allopurinol (ZYLOPRIM) 100 MG tablet Take 1 tablet (100 mg total) by mouth 2 (two) times daily.  Marland Kitchen atorvastatin (LIPITOR) 40 MG tablet TAKE 1 TABLET BY MOUTH EVERY DAY  . Blood Glucose Monitoring Suppl (ACCU-CHEK AVIVA PLUS) w/Device KIT 1 each by Does not apply route 3 (three) times daily.  . budesonide-formoterol (SYMBICORT) 80-4.5 MCG/ACT inhaler Take 2 puffs first thing in am and then another 2 puffs about 12 hours later.  . carvedilol (COREG) 3.125 MG tablet TAKE 1 TABLET BY MOUTH TWICE DAILY  . ENTRESTO 49-51 MG TAKE 1 TABLET BY MOUTH TWICE DAILY  . escitalopram (LEXAPRO) 20 MG tablet Take 1 tablet (20 mg total) by mouth daily.  . furosemide (LASIX) 40 MG tablet Take 1 tablet (40 mg total) by mouth 2 (two) times a week. Monday and Friday mornings.  Marland Kitchen glimepiride (AMARYL) 2 MG tablet TAKE 1 TABLET BY MOUTH DAILY BEFORE BREAKFAST  . glucose blood test strip Use one strip TID  . hydrOXYzine (ATARAX/VISTARIL) 25 MG tablet Take 1 tablet (25 mg total) by mouth at bedtime as needed.  Marland Kitchen ketoconazole (NIZORAL) 2 % cream Apply 1 application topically daily.  Marland Kitchen ketotifen (ALAWAY) 0.025 % ophthalmic solution Place 1 drop into both eyes 2 (two) times daily.  . Lancets (ACCU-CHEK SOFT TOUCH) lancets Use one new lancet TID  . NARCAN 4 MG/0.1ML LIQD nasal spray kit NAR REP ALN  . oxyCODONE-acetaminophen (PERCOCET/ROXICET) 5-325 MG tablet TK 1 T PO TID PRN  . spironolactone (ALDACTONE) 25 MG tablet TAKE 1 TABLET(25 MG) BY MOUTH DAILY  . zolpidem (AMBIEN) 5 MG tablet Take 1 tablet (5 mg total) by mouth at bedtime as needed. for sleep                Objective:   Physical Exam   obese bf very congested sounding cough   Wt Readings from Last 3 Encounters:  11/20/18 201 lb 6.4 oz (91.4 kg)  10/30/18 197 lb (89.4 kg)  09/17/18 198 lb (89.8 kg)     Vital signs reviewed - Note on arrival 02 sats  100% on RA      HEENT: edentulous , nl  turbinates bilaterally, and oropharynx. Nl external ear canals  without cough reflex   NECK :  without JVD/Nodes/TM/ nl carotid upstrokes bilaterally   LUNGS: no acc muscle use,  Nl contour chest with coarse insp/exp rhonchi bilaterally without cough on insp or exp maneuvers   CV:  RRR  no s3 or murmur or increase in P2, and no edema   ABD:  soft and nontender with nl inspiratory excursion in the supine position. No bruits or organomegaly appreciated, bowel sounds nl  MS:  Nl gait/ ext warm without deformities, calf tenderness, cyanosis or clubbing No obvious joint restrictions   SKIN: warm and dry without lesions    NEURO:  alert, approp, nl sensorium  with  no motor or cerebellar deficits apparent.       CXR PA and Lateral:   11/20/2018 :    I personally reviewed images and agree with radiology impression as follows:    No active cardiopulmonary disease.       Assessment & Plan:

## 2018-11-21 ENCOUNTER — Encounter: Payer: Self-pay | Admitting: Internal Medicine

## 2018-11-21 ENCOUNTER — Telehealth: Payer: Self-pay | Admitting: *Deleted

## 2018-11-21 DIAGNOSIS — F17210 Nicotine dependence, cigarettes, uncomplicated: Secondary | ICD-10-CM | POA: Insufficient documentation

## 2018-11-21 NOTE — Assessment & Plan Note (Addendum)
Active smoker PFTs 10/2013:  FEV1 0.86 (41%), ratio 67, TLC 58%, unable to do DLCO Arlyce Harman as part of CPST 02/2014:  FEV1 1.19 (59%), ratio 72 Spiro 11/2014:  FEV1 1.08 (54%), ratio 80 - Spirometry 04/14/2016  FEV1 1.32 (68%)  Ratio 79 with nl effort indep portion of f/v loop  11/20/2018  After extensive coaching inhaler device,  effectiveness =    75% > resume symb 80 2bid x 2 week sample then regroup   Despite active smoking she was actually doing fine on relatively low doses of Symbicort until she ran out.  She may well have developed COPD but for now I am going to give her just a 6 day course of pred and restart her on the lower dose of Symbicort for asthmatic bronchitis and spent time teaching her how to use her medications correctly today with follow-up spirometry  on return

## 2018-11-21 NOTE — Assessment & Plan Note (Signed)
4-5 min discussion re active cigarette smoking in addition to office E&M  Ask about tobacco use:   ongoing Advise quitting   I took an extended  opportunity with this patient to outline the consequences of continued cigarette use  in airway disorders based on all the data we have from the multiple national lung health studies (perfomed over decades at millions of dollars in cost)  indicating that smoking cessation, not choice of inhalers or physicians, is the most important aspect of care.   Assess willingness:  Not committed at this point Assist in quit attempt:  Per PCP when ready Arrange follow up:   Follow up per Primary Care planned  For smoking cessation classes call [phone]     I had an extended discussion with the patient reviewing all relevant studies completed to date and  lasting 15 to 20 minutes of a 25 minute acute office visit    See device teaching which extended face to face time for this visit.  Each maintenance medication was reviewed in detail including emphasizing most importantly the difference between maintenance and prns and under what circumstances the prns are to be triggered using an action plan format that is not reflected in the computer generated alphabetically organized AVS which I have not found useful in most complex patients, especially with respiratory illnesses  Please see AVS for specific instructions unique to this visit that I personally wrote and verbalized to the the pt in detail and then reviewed with pt  by my nurse highlighting any  changes in therapy recommended at today's visit to their plan of care.

## 2018-11-21 NOTE — Telephone Encounter (Signed)
-----   Message from Tanda Rockers, MD sent at 11/21/2018  9:26 AM EST ----- It looks like I forgot to tell her I called her in 6 days of prednisone which she should pick up for sure if she is not doing better by now back on Symbicort

## 2018-11-21 NOTE — Telephone Encounter (Signed)
I spoke with the pt and notified of recs per MW  She verbalized understanding

## 2018-11-27 ENCOUNTER — Other Ambulatory Visit: Payer: Self-pay

## 2018-11-27 ENCOUNTER — Ambulatory Visit (INDEPENDENT_AMBULATORY_CARE_PROVIDER_SITE_OTHER): Payer: Medicare HMO | Admitting: Family Medicine

## 2018-11-27 ENCOUNTER — Encounter (INDEPENDENT_AMBULATORY_CARE_PROVIDER_SITE_OTHER): Payer: Self-pay | Admitting: Family Medicine

## 2018-11-27 VITALS — BP 96/66 | HR 61 | Temp 98.0°F | Ht 60.0 in | Wt 203.6 lb

## 2018-11-27 DIAGNOSIS — R5383 Other fatigue: Secondary | ICD-10-CM | POA: Diagnosis not present

## 2018-11-27 DIAGNOSIS — J309 Allergic rhinitis, unspecified: Secondary | ICD-10-CM | POA: Diagnosis not present

## 2018-11-27 DIAGNOSIS — F418 Other specified anxiety disorders: Secondary | ICD-10-CM | POA: Diagnosis not present

## 2018-11-27 MED ORDER — FLUTICASONE PROPIONATE 50 MCG/ACT NA SUSP: 2.0000 | Freq: Every day | NASAL | 6 refills | Status: DC

## 2018-11-27 NOTE — Progress Notes (Signed)
Subjective:    Patient ID: Stacey Lin, female    DOB: [birth year], 53 y.o.   MRN: [id number]  HPI       53 yo female seen in follow-up of anxiety and depression for which she was recently restarted on Lexapro by her primary care physician after she had previously decided to stop the medication.  Patient at today's visit initially stated that she was feeling much better on the Lexapro.  Patient however then admitted that she still has episodes of tearfulness and crying for no reason.  Patient does not currently see anyone for counseling.  Patient denies any suicidal thoughts or ideations and no intent of self-harm or harm to others.  Patient states that she would be agreeable to seeing someone for counseling.     Patient with complaint of feeling really tired.  Patient states that she feels as if she is physically and mentally tired.  Patient is not sure why she is tired.  Patient does have some issues with sleep but she feels that her sleep has improved with the current medications.  Patient is denying any headaches or dizziness.  Patient denies increased muscle or joint pain.  Patient states that she just feels very fatigued as if she has no energy.  Patient has noticed some issues with nasal congestion and sensation of postnasal drainage.  She does tend to have seasonal issues with sneezing runny nose and congestion.  Patient will prefer nasal spray to appeal.  Past Medical History:  Diagnosis Date  . Anemia   . Anginal pain (HCC)    2 months  . Arrhythmia   . Asthma   . CHF (congestive heart failure) (HCC)    1990s  . Chronic headaches   . COPD (chronic obstructive pulmonary disease) (HCC)    ??  . Hypercholesteremia   . Hypertension    Past Surgical History:  Procedure Laterality Date  . LEFT AND RIGHT HEART CATHETERIZATION WITH CORONARY ANGIOGRAM N/A 06/06/2014   Procedure: LEFT AND RIGHT HEART CATHETERIZATION WITH CORONARY ANGIOGRAM;  Surgeon: Dolores Patty, MD;  Location:  [facility] CATH LAB;  Service: Cardiovascular;  Laterality: N/A;  . MULTIPLE EXTRACTIONS WITH ALVEOLOPLASTY Bilateral 07/24/2015   Procedure: MULTIPLE EXTRACTIONS WITH ALVEOLOPLASTY,  BILATERAL TORI ;  Surgeon: Ocie Doyne, DDS;  Location: [facility] OR;  Service: Oral Surgery;  Laterality: Bilateral;  . TUBAL LIGATION  1986   Family History  Problem Relation Age of Onset  . Hypertension Mother   . Allergies Mother   . Asthma Mother   . Heart disease Mother   . Stomach cancer Mother        Deceased, 83  . Seizures Mother   . Hypertension Father        Deceased  . Hypertension Maternal Grandmother   . Healthy Brother   . Healthy Son   . Healthy Daughter    Social History   Tobacco Use  . Smoking status: Current Every Day Smoker    Packs/day: 0.10    Years: 20.00    Pack years: 2.00    Types: Cigarettes  . Smokeless tobacco: Never Used  Substance Use Topics  . Alcohol use: No    Alcohol/week: 0.0 standard drinks  . Drug use: Not Currently    Frequency: 4.0 times per week    Types: "Crack" cocaine    Comment: RELAPSE 12/2016  No Known Allergies    Review of Systems  Constitutional: Positive for fatigue. Negative for chills and fever.  Respiratory:  Negative for cough and shortness of breath.   Cardiovascular: Negative for chest pain, palpitations and leg swelling.  Gastrointestinal: Negative for abdominal pain, constipation, diarrhea and nausea.  Endocrine: Negative for polydipsia, polyphagia and polyuria.  Musculoskeletal: Negative for back pain and gait problem.  Neurological: Negative for dizziness and headaches.  Psychiatric/Behavioral: Positive for sleep disturbance. Negative for self-injury and suicidal ideas. The patient is nervous/anxious.        Objective:   Physical Exam BP 96/66 (BP Location: Left Arm, Patient Position: Sitting, Cuff Size: Normal)   Pulse 61   Temp 98 F (36.7 C) (Oral)   Ht 5' (1.524 m)   Wt 203 lb 9.6 oz (92.4 kg)   LMP  (LMP Unknown)   SpO2 90%    BMI 39.76 kg/m nurses notes and vital signs reviewed General-well-nourished, well-developed female in no acute distress ENT-TMs dull, patient with edematous nasal turbinates with mild clear nasal discharge (patient however had also been tearful prior to exam which could also cause some edema of the nasal turbinates and presence of discharge), patient with mild posterior pharynx/tonsillar arch edema/erythema Neck-supple, patient does have some mild upper cervical lymph node fullness and mild tenderness to palpation, no palpable thyromegaly Lungs-clear to auscultation bilaterally Cardiovascular-regular rate and rhythm Abdomen- truncal obesity, soft and nontender Back-no CVA tenderness Psych- patient tearful at times and states that she does not know why        Assessment & Plan:  1. Depression with anxiety Patient reports continued issues with anxiety and depression.  She denies any suicidal thoughts or thoughts of self-harm or harm to others.  Patient will continue the use of Lexapro 20 mg.  Patient is being referred to meet with the social worker so that she can obtain more resources and also be referred for counseling as patient states that she has not heard anything regarding the psychiatry appointment for which she was referred at her last visit.  Patient will also have TSH to make sure that hypothyroidism is not playing a role in her depression - Ambulatory referral to Social Work - TSH  2. Fatigue, unspecified type Patient with complaint of fatigue both mentally and physically.  Patient will have BMP, TSH and CBC at today's visit in follow-up.  Patient is encouraged to try and increase outdoor activity such as walking which may help with her depression but also potentially increased vitamin D levels.  Patient will be notified of her lab results.  Patient likely would also benefit from vitamin D secondary to her status as being postmenopausal.  Patient with well-controlled type 2 diabetes  with most recent hemoglobin A1c of 6.6 and 7 months prior, hemoglobin A1c was 6.0 - Basic Metabolic Panel - TSH - CBC with Differential  3. Allergic rhinitis, unspecified seasonality, unspecified trigger Prescription provided for Flonase nasal spray for treatment of allergic rhinitis - fluticasone (FLONASE) 50 MCG/ACT nasal spray; Place 2 sprays into both nostrils daily. As needed for nasal vongestion  Dispense: 16 g; Refill: 6  An After Visit Summary was printed and given to the patient.  Return in about 4 weeks (around 12/25/2018) for anxiety/depression.

## 2018-11-28 LAB — TSH: TSH: 2.24 u[IU]/mL (ref 0.450–4.500)

## 2018-11-28 LAB — CBC WITH DIFFERENTIAL/PLATELET
Basophils Absolute: 0.1 x10E3/uL (ref 0.0–0.2)
Basos: 1 %
EOS (ABSOLUTE): 0.4 x10E3/uL (ref 0.0–0.4)
Eos: 5 %
Hematocrit: 33.3 % — ABNORMAL LOW (ref 34.0–46.6)
Hemoglobin: 11.2 g/dL (ref 11.1–15.9)
Immature Grans (Abs): 0 x10E3/uL (ref 0.0–0.1)
Immature Granulocytes: 0 %
Lymphocytes Absolute: 2 x10E3/uL (ref 0.7–3.1)
Lymphs: 25 %
MCH: 30.4 pg (ref 26.6–33.0)
MCHC: 33.6 g/dL (ref 31.5–35.7)
MCV: 90 fL (ref 79–97)
Monocytes Absolute: 0.6 x10E3/uL (ref 0.1–0.9)
Monocytes: 7 %
Neutrophils Absolute: 5 x10E3/uL (ref 1.4–7.0)
Neutrophils: 62 %
Platelets: 318 x10E3/uL (ref 150–450)
RBC: 3.69 x10E6/uL — ABNORMAL LOW (ref 3.77–5.28)
RDW: 12.4 % (ref 11.7–15.4)
WBC: 8 x10E3/uL (ref 3.4–10.8)

## 2018-11-28 LAB — BASIC METABOLIC PANEL WITH GFR
BUN/Creatinine Ratio: 20 (ref 9–23)
BUN: 18 mg/dL (ref 6–24)
CO2: 20 mmol/L (ref 20–29)
Calcium: 9.9 mg/dL (ref 8.7–10.2)
Chloride: 106 mmol/L (ref 96–106)
Creatinine, Ser: 0.92 mg/dL (ref 0.57–1.00)
GFR calc Af Amer: 82 mL/min/1.73
GFR calc non Af Amer: 71 mL/min/1.73
Glucose: 121 mg/dL — ABNORMAL HIGH (ref 65–99)
Potassium: 4.2 mmol/L (ref 3.5–5.2)
Sodium: 143 mmol/L (ref 134–144)

## 2018-12-04 ENCOUNTER — Encounter: Payer: Self-pay | Admitting: Internal Medicine

## 2018-12-04 ENCOUNTER — Telehealth (INDEPENDENT_AMBULATORY_CARE_PROVIDER_SITE_OTHER): Payer: Self-pay

## 2018-12-04 ENCOUNTER — Ambulatory Visit (INDEPENDENT_AMBULATORY_CARE_PROVIDER_SITE_OTHER): Payer: Medicare HMO | Admitting: Internal Medicine

## 2018-12-04 VITALS — BP 110/70 | HR 75 | Ht 60.0 in | Wt 205.6 lb

## 2018-12-04 DIAGNOSIS — J4541 Moderate persistent asthma with (acute) exacerbation: Secondary | ICD-10-CM | POA: Diagnosis not present

## 2018-12-04 DIAGNOSIS — F17210 Nicotine dependence, cigarettes, uncomplicated: Secondary | ICD-10-CM

## 2018-12-04 MED ORDER — BUDESONIDE-FORMOTEROL FUMARATE 80-4.5 MCG/ACT IN AERO: INHALATION_SPRAY | RESPIRATORY_TRACT | 0 refills | Status: DC

## 2018-12-04 NOTE — Telephone Encounter (Signed)
-----   Message from Antony Blackbird, MD sent at 12/01/2018  8:27 PM EST ----- Glucose of 121, creatinine is now normal. CBC and TSH normal

## 2018-12-04 NOTE — Assessment & Plan Note (Addendum)
Active smoker PFTs 10/2013:  FEV1 0.86 (41%), ratio 67, TLC 58%, unable to do DLCO Arlyce Harman as part of CPST 02/2014:  FEV1 1.19 (59%), ratio 72 Spiro 11/2014:  FEV1 1.08 (54%), ratio 80 - Spirometry 04/14/2016  FEV1 1.32 (68%)  Ratio 79 with nl effort indep portion of f/v loop  11/20/2018   resume symb 80 2bid x 2 week sample plus prednisone  X 6 days  then regroup with all meds in hand > did not do and as of 12/04/2018 no prednisone taken/still smoking - 12/04/2018  After extensive coaching inhaler device,  effectiveness =  75% (short Ti, poor breath hold and trigger)    DDX of  difficult airways management almost all start with A and  include Adherence, Ace Inhibitors, Acid Reflux, Active Sinus Disease, Alpha 1 Antitripsin deficiency, Anxiety masquerading as Airways dz,  ABPA,  Allergy(esp in young), Aspiration (esp in elderly), Adverse effects of meds,  Active smoking or vaping, A bunch of PE's (a small clot burden can't cause this syndrome unless there is already severe underlying pulm or vascular dz with poor reserve) plus two Bs  = Bronchiectasis and Beta blocker use..and one C= CHF   Adherence is always the initial "prime suspect" and is a multilayered concern that requires a "trust but verify" approach in every patient - starting with knowing how to use medications, especially inhalers, correctly, keeping up with refills and understanding the fundamental difference between maintenance and prns vs those medications only taken for a very short course and then stopped and not refilled.  - see hfa teaching - never took pred and says writing too small on bottle so printed instructions again in large font - return with all meds in hand using a trust but verify approach to confirm accurate Medication  Reconciliation The principal here is that until we are certain that the  patients are doing what we've asked, it makes no sense to ask them to do more.   Active smoker > top of the usual lists of suspects   ?  Adverse drug effects > listed as taking entresto with a 9% incidence of cough which is higher than reported orginally with ACEi but for now no change rx  ? Allergy > Prednisone 10 mg take  4 each am x 2 days,   2 each am x 2 days,  1 each am x 2 days and stop   ? Sinus dz / rhinits > continue flonase   ? chf > not apparent on cxr or exam    F/u in 2 weeks

## 2018-12-04 NOTE — Telephone Encounter (Signed)
Patient returned call and was informed that at time of blood collection her glucose was elevated at 121 with normal being under 9. Creatinine, CBC and TSH all normal. Nat Christen, CMA

## 2018-12-04 NOTE — Assessment & Plan Note (Signed)
Counseled re importance of smoking cessation but did not meet time criteria for separate billing   °

## 2018-12-04 NOTE — Progress Notes (Signed)
Subjective:    Patient ID: Stacey Lin, female    DOB: 03-27-1966   MRN: [id number]    Brief patient profile:  18 yobf active smoker previously seen by Dr. Shelle Iron 12/18/14 with dyspnea on exertion.She was felt to have asthmatic bronchitis but not COPD  rec Stop spiriva rx breo 100, take one inhalation each am proair respiclick, 2 inhalations every 6 hrs only if you are having severe breathing issues. Work on weight loss and conditioning. Will send a note to your cardiologist to see about getting you off lisinopril to see if this helps your dry tickling cough. You must stop smoking 100% in order to stay well.  followup with me again in 2mos to check on things> did not return.   History of Present Illness  02/12/2016: NP Follow up Office Visit: Patient presents to the office today with worsening dyspnea on exertion. She was seen at the heart failure clinic one day prior to OV   and they requested that she follow up with pulmonary. Spirometry 11/2014 showed  normal FEV1 with ? air trapping given her   FVC ? 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 She is continuing to smoke approx. 2 cigarettes daily per her history.    she has not been using her Breo inhaler daily as  Maintenance medication, but just when needed.   rec We will give you a Breo sample and new prescription for your Breo.( Maintenance inhaler) Use the Breo 1 puff twice daily. We will give you a prescription for your Pro Air Inhaler ( Rescue Inhaler) Use this every 6 hours as needed for SOB or Wheezing. Claritin ( loratadine) 1 every day for allergies Nasal Saline as needed for nasal congestion Continue weighing yourself daily per the heart failure clinic Follow up in 1 month > did  Not return        11/20/2018  Acute  ov/Anthonella Klausner re: worse since stopped symbicort  Chief Complaint  Patient presents with  . Acute Visit    Increased SOB for the past 2 months. She gets winded walking from room to room at home. She also c/o  occ cough and wheezing. Her cough is mainly non prod.  She is using her albuterol inhaler 2 x per wk on average.   Dyspnea:  Gradually worse x 2 months to point to doe  Room to room = MMRC3  Cough: acutely worse x 2 days / harsh esp daytime > noct  Sleeping: on back with 2 pillows SABA use: confused with which ones help and when to take  rec Plan A = Automatic = symbicort 80 Take 2 puffs first thing in am and then another 2 puffs about 12 hours later.  Work on inhaler technique  The key is to stop smoking completely before smoking completely stops you!  Plan B = Backup Only use your albuterol (ventolin) inhaler  Please schedule a follow up office visit in 2  weeks, call sooner if needed with all medications /inhalers/ solutions in hand so we can verify exactly what you are taking. This includes all medications from all doctors and over the counters - PLEASE separate them into two bags:  the ones you take automatically, no matter what, vs the ones you take just when you feel you need them "BAG #2 is UP TO YOU"  - this will really help [place] help you take your medications more effectively.  - add: Prednisone 10 mg take  4 each am x 2 days,  2 each am x 2 days,  1 each am x 2 days and stop and spirometry on return     12/04/2018  f/u ov/Rayce Brahmbhatt re:  AB / non adherence, still smoking  Chief Complaint  Patient presents with  . Follow-up    Breathing is unchanged. She never started the pred taper b/c she did not understand the instructions. She has not had to use her albuterol inhaler. She has had occ cough and wheezing at night.   Dyspnea:  MMRC3 = can't walk 100 yards even at a slow pace at a flat grade s stopping due to sob   Cough: daytime / not productive  Sleeping: ok after ambien flat bed 2 pillows  SABA use: not using as long as on symbbicort  02: none    No obvious day to day or daytime variability or assoc excess/ purulent sputum or mucus plugs or hemoptysis or cp or chest tightness,  or  overt sinus or hb symptoms.   Says she is sleeping as above  without nocturnal  exacerbation  of respiratory  c/o's or need for noct saba. Also denies any obvious fluctuation of symptoms with weather or environmental changes or other aggravating or alleviating factors except as outlined above   No unusual exposure hx or h/o childhood pna/ asthma or knowledge of premature birth.  Current Allergies, Complete Past Medical History, Past Surgical History, Family History, and Social History were reviewed in [employer] record.  ROS  The following are not active complaints unless bolded Hoarseness, sore throat, dysphagia, dental problems, itching, sneezing,  nasal congestion or discharge of excess mucus or purulent secretions, ear ache,   fever, chills, sweats, unintended wt loss or wt gain, classically pleuritic or exertional cp,  orthopnea pnd or arm/hand swelling  or leg swelling, presyncope, palpitations, abdominal pain, anorexia, nausea, vomiting, diarrhea  or change in bowel habits or change in bladder habits, change in stools or change in urine, dysuria, hematuria,  rash, arthralgias, visual complaints, headache, numbness, weakness or ataxia or problems with walking or coordination,  change in mood or  memory.        Current Meds  - - NOTE:   Unable to verify as accurately reflecting what pt takes     Medication Sig  . albuterol (PROVENTIL HFA;VENTOLIN HFA) 108 (90 Base) MCG/ACT inhaler Inhale 2 puffs into the lungs every 6 (six) hours as needed for wheezing or shortness of breath.  . allopurinol (ZYLOPRIM) 100 MG tablet Take 1 tablet (100 mg total) by mouth 2 (two) times daily.  Marland Kitchen atorvastatin (LIPITOR) 40 MG tablet TAKE 1 TABLET BY MOUTH EVERY DAY  . Blood Glucose Monitoring Suppl (ACCU-CHEK AVIVA PLUS) w/Device KIT 1 each by Does not apply route 3 (three) times daily.  . budesonide-formoterol (SYMBICORT) 80-4.5 MCG/ACT inhaler Take 2 puffs first thing in am and then  another 2 puffs about 12 hours later.  . carvedilol (COREG) 3.125 MG tablet TAKE 1 TABLET BY MOUTH TWICE DAILY  . ENTRESTO 49-51 MG TAKE 1 TABLET BY MOUTH TWICE DAILY  . escitalopram (LEXAPRO) 20 MG tablet Take 1 tablet (20 mg total) by mouth daily.  . fluticasone (FLONASE) 50 MCG/ACT nasal spray Place 2 sprays into both nostrils daily. As needed for nasal vongestion  . furosemide (LASIX) 40 MG tablet Take 1 tablet (40 mg total) by mouth 2 (two) times a week. Monday and Friday mornings.  Marland Kitchen glimepiride (AMARYL) 2 MG tablet TAKE 1 TABLET BY MOUTH DAILY BEFORE  BREAKFAST  . glucose blood test strip Use one strip TID  . hydrOXYzine (ATARAX/VISTARIL) 25 MG tablet Take 1 tablet (25 mg total) by mouth at bedtime as needed.  Marland Kitchen ketoconazole (NIZORAL) 2 % cream Apply 1 application topically daily.  Marland Kitchen ketotifen (ALAWAY) 0.025 % ophthalmic solution Place 1 drop into both eyes 2 (two) times daily.  . Lancets (ACCU-CHEK SOFT TOUCH) lancets Use one new lancet TID  . NARCAN 4 MG/0.1ML LIQD nasal spray kit NAR REP ALN  . oxyCODONE-acetaminophen (PERCOCET/ROXICET) 5-325 MG tablet TK 1 T PO TID PRN  . spironolactone (ALDACTONE) 25 MG tablet TAKE 1 TABLET(25 MG) BY MOUTH DAILY  . zolpidem (AMBIEN) 5 MG tablet Take 1 tablet (5 mg total) by mouth at bedtime as needed. for sleep                   Objective:   Physical Exam  Obese hoarse bf with severe coughing fits to point can't speak   12/04/2018        205   11/20/18 201 lb 6.4 oz (91.4 kg)  10/30/18 197 lb (89.4 kg)  09/17/18 198 lb (89.8 kg)     Vital signs reviewed - Note on arrival 02 sats  100% on RA          HEENT: Edentulous. Nl  turbinates bilaterally, and oropharynx. Nl external ear canals without cough reflex   NECK :  without JVD/Nodes/TM/ nl carotid upstrokes bilaterally   LUNGS: no acc muscle use,  Nl contour chest with insp/exp coarse rhonchi  bilaterally without cough on insp or exp maneuvers   CV:  RRR  no s3 or murmur or  increase in P2, and no edema   ABD:  s quite obese/ nontender with nl inspiratory excursion in the supine position. No bruits or organomegaly appreciated, bowel sounds nl  MS:  Nl gait/ ext warm without deformities, calf tenderness, cyanosis or clubbing No obvious joint restrictions   SKIN: warm and dry without lesions    NEURO:  alert, approp, nl sensorium with  no motor or cerebellar deficits apparent.         I personally reviewed images and agree with radiology impression as follows:  CXR:   11/20/18 No active cardiopulmonary disease.     Assessment & Plan:

## 2018-12-04 NOTE — Assessment & Plan Note (Signed)
Body mass index is 40.15 kg/m.  -  trending up  Lab Results  Component Value Date   TSH 2.240 11/27/2018     Contributing to gerd risk/ doe/reviewed the need and the process to achieve and maintain neg calorie balance > defer f/u primary care including intermittently monitoring thyroid status     I had an extended discussion with the patient reviewing all relevant studies completed to date and  lasting 15 to 20 minutes of a 25 minute visit    See device teaching which extended face to face time for this visit.  Each maintenance medication was reviewed in detail including emphasizing most importantly the difference between maintenance and prns and under what circumstances the prns are to be triggered using an action plan format that is not reflected in the computer generated alphabetically organized AVS which I have not found useful in most complex patients, especially with respiratory illnesses  Please see AVS for specific instructions unique to this visit that I personally wrote and verbalized to the the pt in detail and then reviewed with pt  by my nurse highlighting any  changes in therapy recommended at today's visit to their plan of care.

## 2018-12-04 NOTE — Patient Instructions (Signed)
Prednisone 10 mg take  4 each am x 2 days,   2 each am x 2 days,  1 each am x 2 days and stop    Work on inhaler technique:  relax and gently blow all the way out then take a nice smooth deep breath back in, triggering the inhaler at same time you start breathing in.  Hold for up to 5 seconds if you can. Blow out thru nose. Rinse and gargle with water when done     Plan A = Automatic = Symbicort 80 Take 2 puffs first thing in am and then another 2 puffs about 12 hours later.      Plan B = Backup Only use your albuterol inhaler as a rescue medication to be used if you can't catch your breath by resting or doing a relaxed purse lip breathing pattern.  - The less you use it, the better it will work when you need it. - Ok to use the inhaler up to 2 puffs  every 4 hours if you must but call for appointment if use goes up over your usual need - Don't leave home without it !!  (think of it like the spare tire for your car)    Please schedule a follow up office visit in 2 weeks, sooner if needed  with all medications /inhalers/ solutions in hand so we can verify exactly what you are taking. This includes all medications from all doctors and over the counters

## 2018-12-06 ENCOUNTER — Telehealth (HOSPITAL_COMMUNITY): Payer: Self-pay

## 2018-12-06 NOTE — Telephone Encounter (Signed)
Surgical Clearance (Denial) faxed to [place]. Pt havent been seen in 2 years. Fax receipt received.

## 2018-12-14 ENCOUNTER — Telehealth: Payer: Self-pay | Admitting: Licensed Clinical Social Worker

## 2018-12-14 NOTE — Telephone Encounter (Signed)
Call placed to patient. LCSWA introduced self and explained role at [facility]. Pt was informed of consult from PCP to address behavioral health services. Pt agreed to schedule appointment to meet with LCSWA on 12/25/2018 at 2:00 PM. No additional concerns noted.

## 2018-12-18 ENCOUNTER — Ambulatory Visit (HOSPITAL_COMMUNITY)
Admission: RE | Admit: 2018-12-18 | Discharge: 2018-12-18 | Disposition: A | Discharge status: Home / Self Care | Payer: Medicare HMO | Source: Ambulatory Visit | Attending: Internal Medicine | Admitting: Internal Medicine

## 2018-12-18 ENCOUNTER — Other Ambulatory Visit: Payer: Self-pay

## 2018-12-18 ENCOUNTER — Encounter (HOSPITAL_COMMUNITY): Payer: Self-pay

## 2018-12-18 VITALS — BP 116/82 | HR 70 | Wt 202.2 lb

## 2018-12-18 DIAGNOSIS — Z79899 Other long term (current) drug therapy: Secondary | ICD-10-CM | POA: Insufficient documentation

## 2018-12-18 DIAGNOSIS — I428 Other cardiomyopathies: Secondary | ICD-10-CM | POA: Diagnosis not present

## 2018-12-18 DIAGNOSIS — F17210 Nicotine dependence, cigarettes, uncomplicated: Secondary | ICD-10-CM | POA: Diagnosis not present

## 2018-12-18 DIAGNOSIS — D649 Anemia, unspecified: Secondary | ICD-10-CM | POA: Insufficient documentation

## 2018-12-18 DIAGNOSIS — I10 Essential (primary) hypertension: Secondary | ICD-10-CM

## 2018-12-18 DIAGNOSIS — Z7984 Long term (current) use of oral hypoglycemic drugs: Secondary | ICD-10-CM | POA: Diagnosis not present

## 2018-12-18 DIAGNOSIS — F1910 Other psychoactive substance abuse, uncomplicated: Secondary | ICD-10-CM

## 2018-12-18 DIAGNOSIS — J449 Chronic obstructive pulmonary disease, unspecified: Secondary | ICD-10-CM | POA: Insufficient documentation

## 2018-12-18 DIAGNOSIS — F17200 Nicotine dependence, unspecified, uncomplicated: Secondary | ICD-10-CM | POA: Diagnosis not present

## 2018-12-18 DIAGNOSIS — R0600 Dyspnea, unspecified: Secondary | ICD-10-CM

## 2018-12-18 DIAGNOSIS — I110 Hypertensive heart disease with heart failure: Secondary | ICD-10-CM | POA: Insufficient documentation

## 2018-12-18 DIAGNOSIS — Z8249 Family history of ischemic heart disease and other diseases of the circulatory system: Secondary | ICD-10-CM | POA: Insufficient documentation

## 2018-12-18 DIAGNOSIS — I5042 Chronic combined systolic (congestive) and diastolic (congestive) heart failure: Secondary | ICD-10-CM | POA: Insufficient documentation

## 2018-12-18 DIAGNOSIS — Z7901 Long term (current) use of anticoagulants: Secondary | ICD-10-CM | POA: Insufficient documentation

## 2018-12-18 DIAGNOSIS — I5022 Chronic systolic (congestive) heart failure: Secondary | ICD-10-CM

## 2018-12-18 DIAGNOSIS — E7800 Pure hypercholesterolemia, unspecified: Secondary | ICD-10-CM | POA: Diagnosis not present

## 2018-12-18 NOTE — Patient Instructions (Signed)
Follow up with Dr. Haroldine Laws in 3-4 months

## 2018-12-18 NOTE — Progress Notes (Signed)
Patient ID: Stacey Lin, female   DOB: September 29, 1966, 53 y.o.   MRN: [id number]    Advanced Heart Failure Clinic Note   PCP: Dr Lily Kocher  Pulmonologist: Dr. Shelle Iron HF: Dr. Gala Romney   HPI: Stacey Lin is a 53 yo female with a history of anemia, combined systolic/diastolic heart failure, HTN, and history of polysubstance abuse. Initial ECHO (04/2010) EF 50-55% with grd 1 DD, LVIDd 47mm, mild to mod MR, RA and LA mildly dilated. In 2018 EF 60-65%.   Admitted to the hospital 08/19/13-08/29/13 with A/C systolic HF. Discharge weight 144 lbs.  Today she returns for HF follow up. Overall feeling fine. Mild dyspnea withe xeriton.  Denies PND/Orthopnea. Appetite ok. No fever or chills. Weight has been going up at home but she says she just sits around and eats. She is not longer using crack cocaine. Taking all medications. Smokes 1 cigarette a day.   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 Mod MR and Mod-sev TR Echo (1/15): EF 45%, mild to moderate AI, moderate MR, RV normal size with mildly decreased systolic function.  CPX (5/15): FVC 66%, FEV1 59%, ratio 72%, peak VO2 15.7, slope 22.6 => mildly decreased functional capacity limited primarily by lungs.  Echo (05/2014): EF 55-60%, mild/mod AI, grade I DD and mild MR R/LHC (06/06/14): ectatic coronaries (suspect d/t HTN) no significant arthrosclerosis and normal EF. No significant MR noted Echo (7/16): EF 60-65%, moderate AI.  Echo (11/16): EF 55%, Mild LAE Echo 6/17: EF 20-25% severe central MR ECHO 03/2017: EF 6065% Grade IDD Mild MR ECHO 03/2018: EF 55-60% Grade IDD   SH: Living in [place] with boyfriend. No ETOH or drug use.   FH: Mother deceased: Dementia, seizures, stomach cancer        Father deceased: HTN  Review of systems complete and found to be  negative unless listed in HPI.    Past Medical History:  Diagnosis Date  . Anemia   . Anginal pain (HCC)    2 months  . Arrhythmia   . Asthma   . CHF (congestive heart failure) (HCC)    1990s  . Chronic headaches   . COPD (chronic obstructive pulmonary disease) (HCC)    ??  . Hypercholesteremia   . Hypertension     Current Outpatient Medications  Medication Sig Dispense Refill  . albuterol (PROVENTIL HFA;VENTOLIN HFA) 108 (90 Base) MCG/ACT inhaler Inhale 2 puffs into the lungs every 6 (six) hours as needed for wheezing or shortness of breath. 1 Inhaler 5  . allopurinol (ZYLOPRIM) 100 MG tablet Take 1 tablet (100 mg total) by mouth 2 (two) times daily. 60 tablet 5  . atorvastatin (LIPITOR) 40 MG tablet TAKE 1 TABLET BY MOUTH EVERY DAY 90 tablet 0  . budesonide-formoterol (SYMBICORT) 80-4.5 MCG/ACT inhaler Take 2 puffs first thing in am and then another 2 puffs about 12 hours later. 1 Inhaler 0  . carvedilol (COREG) 3.125 MG tablet TAKE 1 TABLET BY MOUTH TWICE DAILY 60 tablet 5  . ENTRESTO 49-51 MG TAKE 1 TABLET BY MOUTH TWICE DAILY 60 tablet 5  . escitalopram (LEXAPRO) 20 MG tablet Take 1 tablet (20 mg total) by mouth daily. 30 tablet 5  . fluticasone (FLONASE) 50 MCG/ACT nasal spray Place 2 sprays into both nostrils daily. As needed for nasal vongestion 16  g 6  . furosemide (LASIX) 40 MG tablet Take 40 mg by mouth daily.    . hydrOXYzine (ATARAX/VISTARIL) 25 MG tablet Take 1 tablet (25 mg total) by mouth at bedtime as needed. 30 tablet 0  . ketoconazole (NIZORAL) 2 % cream Apply 1 application topically daily. 30 g 0  . ketotifen (ALAWAY) 0.025 % ophthalmic solution Place 1 drop into both eyes 2 (two) times daily. 5 mL 0  . oxyCODONE-acetaminophen (PERCOCET/ROXICET) 5-325 MG tablet TK 1 T PO TID PRN  0  . spironolactone (ALDACTONE) 25 MG tablet TAKE 1 TABLET(25 MG) BY MOUTH DAILY 30 tablet 5  . zolpidem (AMBIEN) 5 MG tablet Take 1 tablet (5 mg total) by mouth at bedtime as needed.  for sleep 30 tablet 0  . Blood Glucose Monitoring Suppl (ACCU-CHEK AVIVA PLUS) w/Device KIT 1 each by Does not apply route 3 (three) times daily. 1 kit 0  . glimepiride (AMARYL) 2 MG tablet TAKE 1 TABLET BY MOUTH DAILY BEFORE BREAKFAST (Patient not taking: Reported on 12/18/2018) 30 tablet 5  . glucose blood test strip Use one strip TID 100 each 12  . Lancets (ACCU-CHEK SOFT TOUCH) lancets Use one new lancet TID 100 each 12  . NARCAN 4 MG/0.1ML LIQD nasal spray kit NAR REP ALN  0  . predniSONE (DELTASONE) 10 MG tablet Take  4 each am x 2 days,   2 each am x 2 days,  1 each am x 2 days and stop (Patient not taking: Reported on 12/18/2018) 14 tablet 0   No current facility-administered medications for this encounter.    Vitals:   12/18/18 1143  BP: 116/82  Pulse: 70  SpO2: 97%  Weight: 91.7 kg (202 lb 3.2 oz)   Wt Readings from Last 3 Encounters:  12/18/18 91.7 kg (202 lb 3.2 oz)  12/04/18 93.3 kg (205 lb 9.6 oz)  11/27/18 92.4 kg (203 lb 9.6 oz)     PHYSICAL EXAM: General:  Well appearing. No resp difficulty HEENT: normal Neck: supple. no JVD. Carotids 2+ bilat; no bruits. No lymphadenopathy or thryomegaly appreciated. Cor: PMI nondisplaced. Regular rate & rhythm. No rubs, gallops or murmurs. Lungs: clear Abdomen: soft, nontender, nondistended. No hepatosplenomegaly. No bruits or masses. Good bowel sounds. Extremities: no cyanosis, clubbing, rash, edema Neuro: alert & orientedx3, cranial nerves grossly intact. moves all 4 extremities w/o difficulty. Affect pleasant   ASSESSMENT & PLAN: 1) Chronic systolic  HF with now normal EF:  Nonischemic cardiomyopathy, Echo 03/2018 EF 55-60% .  NYHA II. Volume status stable. Continue lasix 40 mg daily.  - Continue coreg 3.125 mg BID -  Continue spiro 25 mg daily.  - Continue Entresto 49/51.  - I reviewed recent BMET from earlier this month. Stable renal funciton.    2.) HTN:  Stable.  3) Substance abuse No longer using. Congratulated.     4) Tobacco Abuse Discussed smoking cessation.  5. Obesity Body mass index is 39.49 kg/m.' Discussed portion control.  6. Dyspnea She does not appear volume overloaded. She has follow up with Dr Sherene Sires.   Follow up 3-4 months. Should be able to refer to general cardiology.     Amy [name] NP-C  12:02 PM

## 2018-12-19 ENCOUNTER — Ambulatory Visit (INDEPENDENT_AMBULATORY_CARE_PROVIDER_SITE_OTHER): Payer: Medicare HMO | Admitting: Internal Medicine

## 2018-12-19 ENCOUNTER — Telehealth: Payer: Self-pay | Admitting: Internal Medicine

## 2018-12-19 ENCOUNTER — Encounter: Payer: Self-pay | Admitting: Internal Medicine

## 2018-12-19 ENCOUNTER — Ambulatory Visit: Payer: Medicare HMO | Admitting: Internal Medicine

## 2018-12-19 VITALS — BP 132/62 | HR 82 | Ht 60.0 in | Wt 202.2 lb

## 2018-12-19 DIAGNOSIS — F17210 Nicotine dependence, cigarettes, uncomplicated: Secondary | ICD-10-CM

## 2018-12-19 DIAGNOSIS — R05 Cough: Secondary | ICD-10-CM

## 2018-12-19 DIAGNOSIS — J4541 Moderate persistent asthma with (acute) exacerbation: Secondary | ICD-10-CM | POA: Diagnosis not present

## 2018-12-19 DIAGNOSIS — R058 Other specified cough: Secondary | ICD-10-CM

## 2018-12-19 LAB — CBC WITH DIFFERENTIAL/PLATELET
BASOS ABS: 0 10*3/uL (ref 0.0–0.1)
Basophils Relative: 0.4 % (ref 0.0–3.0)
Eosinophils Absolute: 0.5 10*3/uL (ref 0.0–0.7)
Eosinophils Relative: 4.5 % (ref 0.0–5.0)
HCT: 36.3 % (ref 36.0–46.0)
HEMOGLOBIN: 12 g/dL (ref 12.0–15.0)
Lymphocytes Relative: 16.6 % (ref 12.0–46.0)
Lymphs Abs: 1.9 10*3/uL (ref 0.7–4.0)
MCHC: 33 g/dL (ref 30.0–36.0)
MCV: 91.1 fl (ref 78.0–100.0)
MONO ABS: 0.5 10*3/uL (ref 0.1–1.0)
Monocytes Relative: 4.7 % (ref 3.0–12.0)
Neutro Abs: 8.5 10*3/uL — ABNORMAL HIGH (ref 1.4–7.7)
Neutrophils Relative %: 73.8 % (ref 43.0–77.0)
Platelets: 321 10*3/uL (ref 150.0–400.0)
RBC: 3.98 Mil/uL (ref 3.87–5.11)
RDW: 13.1 % (ref 11.5–15.5)
WBC: 11.6 10*3/uL — AB (ref 4.0–10.5)

## 2018-12-19 MED ORDER — PREDNISONE 10 MG PO TABS: ORAL_TABLET | ORAL | 0 refills | Status: DC

## 2018-12-19 MED ORDER — BENZONATATE 200 MG PO CAPS: 200.0000 mg | ORAL_CAPSULE | Freq: Three times a day (TID) | ORAL | 1 refills | Status: DC | PRN

## 2018-12-19 NOTE — Telephone Encounter (Signed)
Pt dropped off her [facility] survey which she already completed  This was placed back in the self addressed envelope and mailed  Nothing further needed

## 2018-12-19 NOTE — Patient Instructions (Addendum)
Stop smoking if at all possible  Brush your tongue with [employer] toothpaste and garlge with the slurry  Prednisone 10 mg take  4 each am x 2 days,   2 each am x 2 days,  1 each am x 2 days and stop to see if helps   As needed > tessalon 200 mg every 6 hours as needed    GERD (REFLUX)  is an extremely common cause of respiratory symptoms just like yours , many times with no obvious heartburn at all.    It can be treated with medication, but also with lifestyle changes including elevation of the head of your bed (ideally with 6 -8inch blocks under the headboard of your bed),  Smoking cessation, avoidance of late meals, excessive alcohol, and avoid fatty foods, chocolate, peppermint, colas, red wine, and acidic juices such as orange juice.  NO MINT OR MENTHOL PRODUCTS SO NO COUGH DROPS  USE SUGARLESS CANDY INSTEAD (Jolley ranchers or Stover's or Life Savers) or even ice chips will also do - the key is to swallow to prevent all throat clearing. NO OIL BASED VITAMINS - use powdered substitutes.  Avoid fish oil when coughing.     Please remember to go to the lab department   for your tests - we will call you with the results when they are available.      Please schedule a follow up office visit in 6 weeks, call sooner if needed with PFTs on return  - add next step is trial of ppi

## 2018-12-19 NOTE — Progress Notes (Signed)
Subjective:    Patient ID: Stacey Lin, female    DOB: [birth year]   MRN: [id number]    Brief patient profile:  39 yobf active smoker previously seen by Dr. Shelle Iron 12/18/14 with dyspnea on exertion.She was felt to have asthmatic bronchitis but not COPD  rec Stop spiriva rx breo 100, take one inhalation each am proair respiclick, 2 inhalations every 6 hrs only if you are having severe breathing issues. Work on weight loss and conditioning. Will send a note to your cardiologist to see about getting you off lisinopril to see if this helps your dry tickling cough. You must stop smoking 100% in order to stay well.  followup with me again in 2mos to check on things> did not return.   History of Present Illness  02/12/2016: NP Follow up Office Visit: Patient presents to the office today with worsening dyspnea on exertion. She was seen at the heart failure clinic one day prior to OV   and they requested that she follow up with pulmonary. Spirometry 11/2014 showed  normal FEV1 with ? air trapping given her   FVC ? 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 She is continuing to smoke approx. 2 cigarettes daily per her history.    she has not been using her Breo inhaler daily as  Maintenance medication, but just when needed.   rec We will give you a Breo sample and new prescription for your Breo.( Maintenance inhaler) Use the Breo 1 puff twice daily. We will give you a prescription for your Pro Air Inhaler ( Rescue Inhaler) Use this every 6 hours as needed for SOB or Wheezing. Claritin ( loratadine) 1 every day for allergies Nasal Saline as needed for nasal congestion Continue weighing yourself daily per the heart failure clinic Follow up in 1 month > did  Not return        11/20/2018  Acute  ov/Stacey Lin re: worse since stopped symbicort  Chief Complaint  Patient presents with  . Acute Visit    Increased SOB for the past 2 months. She gets winded walking from room to room at home. She also c/o  occ cough and wheezing. Her cough is mainly non prod.  She is using her albuterol inhaler 2 x per wk on average.   Dyspnea:  Gradually worse x 2 months to point to doe  Room to room = MMRC3  Cough: acutely worse x 2 days / harsh esp daytime > noct  Sleeping: on back with 2 pillows SABA use: confused with which ones help and when to take  rec Plan A = Automatic = symbicort 80 Take 2 puffs first thing in am and then another 2 puffs about 12 hours later.  Work on inhaler technique  The key is to stop smoking completely before smoking completely stops you!  Plan B = Backup Only use your albuterol (ventolin) inhaler  Please schedule a follow up office visit in 2  weeks, call sooner if needed with all medications /inhalers/ solutions in hand so we can verify exactly what you are taking. This includes all medications from all doctors and over the counters - PLEASE separate them into two bags:  the ones you take automatically, no matter what, vs the ones you take just when you feel you need them "BAG #2 is UP TO YOU"  - this will really help [place] help you take your medications more effectively.  - add: Prednisone 10 mg take  4 each am x 2 days,  2 each am x 2 days,  1 each am x 2 days and stop and spirometry on return     12/04/2018  f/u ov/Stacey Lin re:  AB / non adherence, still smoking  Chief Complaint  Patient presents with  . Follow-up    Breathing is unchanged. She never started the pred taper b/c she did not understand the instructions. She has not had to use her albuterol inhaler. She has had occ cough and wheezing at night.   Dyspnea:  MMRC3 = can't walk 100 yards even at a slow pace at a flat grade s stopping due to sob   Cough: daytime / not productive  Sleeping: ok after ambien flat bed 2 pillows  SABA use: not using as long as on symbbicort  02: none  rec  Prednisone 10 mg take  4 each am x 2 days,   2 each am x 2 days,  1 each am x 2 days and stop  Work on inhaler technique:   Plan A =  Automatic = Symbicort 80 Take 2 puffs first thing in am and then another 2 puffs about 12 hours later.  Plan B = Backup Only use your albuterol inhaler as a rescue medication Please schedule a follow up office visit in 2 weeks, sooner if needed  with all medications /inhalers/ solutions in hand so we can verify exactly what you are taking. This includes all medications from all doctors and over the counters     12/19/2018  f/u ov/Stacey Lin re: cough x 3months / maint on symbicort 80 2bid / still smoking Chief Complaint  Patient presents with  . Follow-up    Breathing is doing some better and she is coughing less. She has not had to use her albuterol inhaler.    Dyspnea:  Can't do [employer] due to sob and  R knee pain  Cough: more day than noct and dry Sleeping: bed is flat/ 2 pillows  SABA : not using at present  02: no Using lots of mints    No obvious day to day or daytime variability or assoc excess/ purulent sputum or mucus plugs or hemoptysis or cp or chest tightness, subjective wheeze or overt sinus or hb symptoms.   Sleeping as above  without nocturnal  or early am exacerbation  of respiratory  c/o's or need for noct saba. Also denies any obvious fluctuation of symptoms with weather or environmental changes or other aggravating or alleviating factors except as outlined above   No unusual exposure hx or h/o childhood pna/ asthma or knowledge of premature birth.  Current Allergies, Complete Past Medical History, Past Surgical History, Family History, and Social History were reviewed in [employer] record.  ROS  The following are not active complaints unless bolded Hoarseness, sore throat, dysphagia, dental problems, itching, sneezing,  nasal congestion or discharge of excess mucus or purulent secretions, ear ache,   fever, chills, sweats, unintended wt loss or wt gain, classically pleuritic or exertional cp,  orthopnea pnd or arm/hand swelling  or leg swelling,  presyncope, palpitations, abdominal pain, anorexia, nausea, vomiting, diarrhea  or change in bowel habits or change in bladder habits, change in stools or change in urine, dysuria, hematuria,  rash, arthralgias, visual complaints, headache, numbness, weakness or ataxia or problems with walking or coordination,  change in mood or  memory.        Current Meds  Medication Sig  . albuterol (PROVENTIL HFA;VENTOLIN HFA) 108 (90 Base) MCG/ACT inhaler  Inhale 2 puffs into the lungs every 6 (six) hours as needed for wheezing or shortness of breath.  . allopurinol (ZYLOPRIM) 100 MG tablet Take 1 tablet (100 mg total) by mouth 2 (two) times daily.  Marland Kitchen atorvastatin (LIPITOR) 40 MG tablet TAKE 1 TABLET BY MOUTH EVERY DAY  . Blood Glucose Monitoring Suppl (ACCU-CHEK AVIVA PLUS) w/Device KIT 1 each by Does not apply route 3 (three) times daily.  . budesonide-formoterol (SYMBICORT) 80-4.5 MCG/ACT inhaler Take 2 puffs first thing in am and then another 2 puffs about 12 hours later.  . carvedilol (COREG) 3.125 MG tablet TAKE 1 TABLET BY MOUTH TWICE DAILY  . ENTRESTO 49-51 MG TAKE 1 TABLET BY MOUTH TWICE DAILY  . escitalopram (LEXAPRO) 20 MG tablet Take 1 tablet (20 mg total) by mouth daily.  . fluticasone (FLONASE) 50 MCG/ACT nasal spray Place 2 sprays into both nostrils daily. As needed for nasal vongestion  . furosemide (LASIX) 40 MG tablet Take 40 mg by mouth daily.  Marland Kitchen glimepiride (AMARYL) 2 MG tablet TAKE 1 TABLET BY MOUTH DAILY BEFORE BREAKFAST  . glucose blood test strip Use one strip TID  . ketoconazole (NIZORAL) 2 % cream Apply 1 application topically daily.  Marland Kitchen ketotifen (ALAWAY) 0.025 % ophthalmic solution Place 1 drop into both eyes 2 (two) times daily.  . Lancets (ACCU-CHEK SOFT TOUCH) lancets Use one new lancet TID  . NARCAN 4 MG/0.1ML LIQD nasal spray kit NAR REP ALN  . oxyCODONE-acetaminophen (PERCOCET/ROXICET) 5-325 MG tablet TK 1 T PO TID PRN  . spironolactone (ALDACTONE) 25 MG tablet TAKE 1  TABLET(25 MG) BY MOUTH DAILY                 Objective:   Physical Exam  Obese bf still clearing throat with dry coughing fits / mints in mouth   12/19/2018        202  12/04/2018        205   11/20/18 201 lb 6.4 oz (91.4 kg)  10/30/18 197 lb (89.4 kg)  09/17/18 198 lb (89.8 kg)     Vital signs reviewed - Note on arrival 02 sats  95% on RA      HEENT: Edentulous / nl  turbinates bilaterally, and oropharynx which is pristine s excess pnds. Nl external ear canals without cough reflex   NECK :  without JVD/Nodes/TM/ nl carotid upstrokes bilaterally   LUNGS: no acc muscle use,  Nl contour chest with mostly upper airway transmitted noises, minimal insp /exp rhonchi bilaterally without cough on insp or exp maneuvers   CV:  RRR  no s3 or murmur or increase in P2, and no edema   ABD: Obese/ soft and nontender with nl inspiratory excursion in the supine position. No bruits or organomegaly appreciated, bowel sounds nl  MS:  Nl gait/ ext warm without deformities, calf tenderness, cyanosis or clubbing No obvious joint restrictions   SKIN: warm and dry without lesions    NEURO:  alert, approp, nl sensorium with  no motor or cerebellar deficits apparent.            Assessment & Plan:

## 2018-12-19 NOTE — Telephone Encounter (Signed)
Will route to Stacey Lin for follow up.

## 2018-12-20 ENCOUNTER — Encounter: Payer: Self-pay | Admitting: Internal Medicine

## 2018-12-20 DIAGNOSIS — R05 Cough: Secondary | ICD-10-CM | POA: Insufficient documentation

## 2018-12-20 DIAGNOSIS — R058 Other specified cough: Secondary | ICD-10-CM | POA: Insufficient documentation

## 2018-12-20 LAB — RESPIRATORY ALLERGY PROFILE REGION II ~~LOC~~
Allergen, Cedar tree, t12: <0.1 kU/L
Allergen, Cottonwood, t14: <0.1 kU/L
Allergen, D pternoyssinus,d7: <0.1 kU/L
Allergen, Mouse Urine Protein, e78: <0.1 kU/L
Allergen, Mulberry, t76: <0.1 kU/L
Allergen, Oak,t7: <0.1 kU/L
Allergen, P. notatum, m1: <0.1 kU/L
Aspergillus fumigatus, m3: <0.1 kU/L
CLASS: 0
CLASS: 0
CLASS: 0
CLASS: 0
CLASS: 0
CLASS: 0
CLASS: 0
CLASS: 0
CLASS: 0
CLASS: 0
Cat Dander: <0.1 kU/L
Class: 0
Class: 0
Class: 0
Class: 0
Class: 0
Class: 0
Class: 0
Class: 0
Class: 0
Class: 0
Class: 0
Class: 0
Class: 0
Class: 0
Cockroach: <0.1 kU/L
D. farinae: <0.1 kU/L
Dog Dander: <0.1 kU/L
IgE (Immunoglobulin E), Serum: 8 kU/L (ref <=114)
Rough Pigweed  IgE: <0.1 kU/L
Timothy Grass: <0.1 kU/L

## 2018-12-20 LAB — INTERPRETATION:

## 2018-12-20 NOTE — Assessment & Plan Note (Signed)
Active smoker PFTs 10/2013:  FEV1 0.86 (41%), ratio 67, TLC 58%, unable to do DLCO Arlyce Harman as part of CPST 02/2014:  FEV1 1.19 (59%), ratio 72 Spiro 11/2014:  FEV1 1.08 (54%), ratio 80 - Spirometry 04/14/2016  FEV1 1.32 (68%)  Ratio 79 with nl effort indep portion of f/v loop  11/20/2018   resume symb 80 2bid x 2 week sample plus prednisone  X 6 days  then regroup with all meds in hand > did not do and as of 12/04/2018 no prednisone taken/still smoking  - 12/19/2018  After extensive coaching inhaler device,  effectiveness =    90% from baseline 50%  - Allergy profile 12/19/2018 >  Eos 0. /  IgE  Pending   It is really difficult to sort out the different components of her cough but I do not believe she has significant airflow obstruction at this point despite smoking and despite a very poor baseline HFA technique so I did not change the Symbicort and recommended she continue 80 strength at 2 puffs every 12 hours but be sure to rinse and gargle after use to prevent irritation of the upper airway.

## 2018-12-20 NOTE — Assessment & Plan Note (Signed)
Counseled re importance of smoking cessation but did not meet time criteria for separate billing   °

## 2018-12-20 NOTE — Assessment & Plan Note (Signed)
Onset Nov 2019 while on entresto - Allergy profile 12/19/2018 >  Eos 0. /  IgE   - d/c all mints 12/19/2018  And rx tessilon    Upper airway cough syndrome (previously labeled PNDS),  is so named because it's frequently impossible to sort out how much is  CR/sinusitis with freq throat clearing (which can be related to primary GERD)   vs  causing  secondary (" extra esophageal")  GERD from wide swings in gastric pressure that occur with throat clearing, often  promoting self use of mint and menthol lozenges that reduce the lower esophageal sphincter tone and exacerbate the problem further in a cyclical fashion.   These are the same pts (now being labeled as having "irritable larynx syndrome" by some cough centers) who not infrequently have a history of having failed to tolerate ace inhibitors,  dry powder inhalers or biphosphonates or report having atypical/extraesophageal reflux symptoms that don't respond to standard doses of PPI  and are easily confused as having aecopd or asthma flares by even experienced allergists/ pulmonologists (myself included).   For now will rec gerd diet/ prn tessalon and no smoking and next step would be max acid suppression trial of not improving as next step    I had an extended discussion with the patient reviewing all relevant studies completed to date and  lasting 15 to 20 minutes of a 25 minute visit    See device teaching which extended face to face time for this visit.  Each maintenance medication was reviewed in detail including emphasizing most importantly the difference between maintenance and prns and under what circumstances the prns are to be triggered using an action plan format that is not reflected in the computer generated alphabetically organized AVS which I have not found useful in most complex patients, especially with respiratory illnesses  Please see AVS for specific instructions unique to this visit that I personally wrote and verbalized to the the  pt in detail and then reviewed with pt  by my nurse highlighting any  changes in therapy recommended at today's visit to their plan of care.

## 2018-12-21 ENCOUNTER — Telehealth: Payer: Self-pay | Admitting: Internal Medicine

## 2018-12-21 ENCOUNTER — Telehealth (INDEPENDENT_AMBULATORY_CARE_PROVIDER_SITE_OTHER): Payer: Self-pay | Admitting: Primary Care

## 2018-12-21 NOTE — Telephone Encounter (Signed)
Notes recorded by Tanda Rockers, MD on 12/20/2018 at 3:04 PM EST Call patient : Studies are unremarkable, no change in recs ---------------------------------- Spoke with pt. She is aware of results. Nothing further was needed.

## 2018-12-21 NOTE — Telephone Encounter (Signed)
Open in error

## 2018-12-24 ENCOUNTER — Other Ambulatory Visit (HOSPITAL_COMMUNITY): Payer: Self-pay | Admitting: Internal Medicine

## 2018-12-25 ENCOUNTER — Other Ambulatory Visit: Payer: Self-pay

## 2018-12-25 ENCOUNTER — Ambulatory Visit (INDEPENDENT_AMBULATORY_CARE_PROVIDER_SITE_OTHER): Payer: Medicare HMO | Admitting: Licensed Clinical Social Worker

## 2018-12-25 ENCOUNTER — Encounter (INDEPENDENT_AMBULATORY_CARE_PROVIDER_SITE_OTHER): Payer: Self-pay | Admitting: Primary Care

## 2018-12-25 ENCOUNTER — Ambulatory Visit (INDEPENDENT_AMBULATORY_CARE_PROVIDER_SITE_OTHER): Payer: Medicare HMO | Admitting: Primary Care

## 2018-12-25 VITALS — BP 102/64 | HR 96 | Temp 98.5°F | Ht 60.0 in | Wt 201.8 lb

## 2018-12-25 DIAGNOSIS — F331 Major depressive disorder, recurrent, moderate: Secondary | ICD-10-CM

## 2018-12-25 DIAGNOSIS — I5022 Chronic systolic (congestive) heart failure: Secondary | ICD-10-CM

## 2018-12-25 DIAGNOSIS — F418 Other specified anxiety disorders: Secondary | ICD-10-CM

## 2018-12-25 DIAGNOSIS — R05 Cough: Secondary | ICD-10-CM | POA: Diagnosis not present

## 2018-12-25 DIAGNOSIS — F17210 Nicotine dependence, cigarettes, uncomplicated: Secondary | ICD-10-CM | POA: Diagnosis not present

## 2018-12-25 DIAGNOSIS — R0600 Dyspnea, unspecified: Secondary | ICD-10-CM | POA: Diagnosis not present

## 2018-12-25 DIAGNOSIS — G4700 Insomnia, unspecified: Secondary | ICD-10-CM

## 2018-12-25 DIAGNOSIS — E7841 Elevated Lipoprotein(a): Secondary | ICD-10-CM

## 2018-12-25 DIAGNOSIS — R058 Other specified cough: Secondary | ICD-10-CM

## 2018-12-25 DIAGNOSIS — Z760 Encounter for issue of repeat prescription: Secondary | ICD-10-CM

## 2018-12-25 MED ORDER — ATORVASTATIN CALCIUM 40 MG PO TABS: 40.0000 mg | ORAL_TABLET | Freq: Every day | ORAL | 0 refills | Status: DC

## 2018-12-25 MED ORDER — GLIMEPIRIDE 2 MG PO TABS: ORAL_TABLET | ORAL | 5 refills | Status: DC

## 2018-12-25 MED ORDER — ESCITALOPRAM OXALATE 20 MG PO TABS: 20.0000 mg | ORAL_TABLET | Freq: Every day | ORAL | 5 refills | Status: DC

## 2018-12-25 MED ORDER — ALLOPURINOL 100 MG PO TABS: 100.0000 mg | ORAL_TABLET | Freq: Two times a day (BID) | ORAL | 5 refills | Status: DC

## 2018-12-25 MED ORDER — ZOLPIDEM TARTRATE 5 MG PO TABS: 5.0000 mg | ORAL_TABLET | Freq: Every evening | ORAL | 0 refills | Status: DC | PRN

## 2018-12-25 NOTE — Progress Notes (Signed)
Established Patient Office Visit  Subjective:  Patient ID: Stacey Lin, female    DOB: [birth year]  Age: 53 y.o. MRN: [id number]  CC:  Chief Complaint  Patient presents with  . Follow-up    anxiety/depression  . URI  . Medication Refill    lexapro/ambien    HPI Stacey Lin presents for anxiety and depression. She had a extensive meeting with CSW.Marland Kitchen PMH of dyspnea on exertion, heart failure, and smokes approx. 2 cigarettes and asthma.   Past Medical History:  Diagnosis Date  . Anemia   . Anginal pain (HCC)    2 months  . Arrhythmia   . Asthma   . CHF (congestive heart failure) (HCC)    1990s  . Chronic headaches   . COPD (chronic obstructive pulmonary disease) (HCC)    ??  . Hypercholesteremia   . Hypertension     Past Surgical History:  Procedure Laterality Date  . LEFT AND RIGHT HEART CATHETERIZATION WITH CORONARY ANGIOGRAM N/A 06/06/2014   Procedure: LEFT AND RIGHT HEART CATHETERIZATION WITH CORONARY ANGIOGRAM;  Surgeon: Dolores Patty, MD;  Location: [facility] CATH LAB;  Service: Cardiovascular;  Laterality: N/A;  . MULTIPLE EXTRACTIONS WITH ALVEOLOPLASTY Bilateral 07/24/2015   Procedure: MULTIPLE EXTRACTIONS WITH ALVEOLOPLASTY,  BILATERAL TORI ;  Surgeon: Ocie Doyne, DDS;  Location: [facility] OR;  Service: Oral Surgery;  Laterality: Bilateral;  . TUBAL LIGATION  1986    Family History  Problem Relation Age of Onset  . Hypertension Mother   . Allergies Mother   . Asthma Mother   . Heart disease Mother   . Stomach cancer Mother        Deceased, 49  . Seizures Mother   . Hypertension Father        Deceased  . Hypertension Maternal Grandmother   . Healthy Brother   . Healthy Son   . Healthy Daughter     Social History   Socioeconomic History  . Marital status: Legally Separated    Spouse name: Not on file  . Number of children: Not on file  . Years of education: Not on file  . Highest education level: Not on file  Occupational History  . Not on file   Social Needs  . Financial resource strain: Not on file  . Food insecurity:    Worry: Not on file    Inability: Not on file  . Transportation needs:    Medical: Not on file    Non-medical: Not on file  Tobacco Use  . Smoking status: Current Every Day Smoker    Packs/day: 1.00    Years: 35.00    Pack years: 35.00    Types: Cigarettes  . Smokeless tobacco: Never Used  . Tobacco comment: currently 2 cigs per day 12/19/2018  Substance and Sexual Activity  . Alcohol use: No    Alcohol/week: 0.0 standard drinks  . Drug use: Not Currently    Frequency: 4.0 times per week    Types: "Crack" cocaine    Comment: RELAPSE 12/2016  . Sexual activity: Not on file  Lifestyle  . Physical activity:    Days per week: Not on file    Minutes per session: Not on file  . Stress: Not on file  Relationships  . Social connections:    Talks on phone: Not on file    Gets together: Not on file    Attends religious service: Not on file    Active member of club or organization: Not on file  Attends meetings of clubs or organizations: Not on file    Relationship status: Not on file  . Intimate partner violence:    Fear of current or ex partner: Not on file    Emotionally abused: Not on file    Physically abused: Not on file    Forced sexual activity: Not on file  Other Topics Concern  . Not on file  Social History Narrative   Lives with son in an apartment on the third floor.  Does not work.  On disability.  Previously worked in [employer].    Education: high school.      Outpatient Medications Prior to Visit  Medication Sig Dispense Refill  . albuterol (PROVENTIL HFA;VENTOLIN HFA) 108 (90 Base) MCG/ACT inhaler Inhale 2 puffs into the lungs every 6 (six) hours as needed for wheezing or shortness of breath. 1 Inhaler 5  . allopurinol (ZYLOPRIM) 100 MG tablet Take 1 tablet (100 mg total) by mouth 2 (two) times daily. 60 tablet 5  . atorvastatin (LIPITOR) 40 MG tablet TAKE 1 TABLET BY MOUTH EVERY  DAY 90 tablet 0  . benzonatate (TESSALON) 200 MG capsule Take 1 capsule (200 mg total) by mouth 3 (three) times daily as needed for cough. 30 capsule 1  . Blood Glucose Monitoring Suppl (ACCU-CHEK AVIVA PLUS) w/Device KIT 1 each by Does not apply route 3 (three) times daily. 1 kit 0  . budesonide-formoterol (SYMBICORT) 80-4.5 MCG/ACT inhaler Take 2 puffs first thing in am and then another 2 puffs about 12 hours later. 1 Inhaler 0  . carvedilol (COREG) 3.125 MG tablet TAKE 1 TABLET BY MOUTH TWICE DAILY 60 tablet 5  . ENTRESTO 49-51 MG TAKE 1 TABLET BY MOUTH TWICE DAILY 60 tablet 5  . escitalopram (LEXAPRO) 20 MG tablet Take 1 tablet (20 mg total) by mouth daily. 30 tablet 5  . fluticasone (FLONASE) 50 MCG/ACT nasal spray Place 2 sprays into both nostrils daily. As needed for nasal vongestion 16 g 6  . furosemide (LASIX) 40 MG tablet Take 40 mg by mouth daily.    Marland Kitchen glimepiride (AMARYL) 2 MG tablet TAKE 1 TABLET BY MOUTH DAILY BEFORE BREAKFAST 30 tablet 5  . glucose blood test strip Use one strip TID 100 each 12  . hydrOXYzine (ATARAX/VISTARIL) 25 MG tablet Take 1 tablet (25 mg total) by mouth at bedtime as needed. 30 tablet 0  . ketoconazole (NIZORAL) 2 % cream Apply 1 application topically daily. 30 g 0  . ketotifen (ALAWAY) 0.025 % ophthalmic solution Place 1 drop into both eyes 2 (two) times daily. 5 mL 0  . Lancets (ACCU-CHEK SOFT TOUCH) lancets Use one new lancet TID 100 each 12  . predniSONE (DELTASONE) 10 MG tablet Take  4 each am x 2 days,   2 each am x 2 days,  1 each am x 2 days and stop 14 tablet 0  . spironolactone (ALDACTONE) 25 MG tablet TAKE 1 TABLET(25 MG) BY MOUTH DAILY 30 tablet 5  . zolpidem (AMBIEN) 5 MG tablet Take 1 tablet (5 mg total) by mouth at bedtime as needed. for sleep 30 tablet 0  . NARCAN 4 MG/0.1ML LIQD nasal spray kit NAR REP ALN  0  . oxyCODONE-acetaminophen (PERCOCET/ROXICET) 5-325 MG tablet TK 1 T PO TID PRN  0   No facility-administered medications prior to  visit.     No Known Allergies  ROS Review of Systems  Constitutional: Positive for activity change.  HENT: Positive for congestion.  Dentures upper and lowers   Eyes: Negative.   Respiratory: Positive for cough and shortness of breath.   Gastrointestinal: Negative.   Endocrine: Negative.   Genitourinary: Negative.   Musculoskeletal: Positive for arthralgias.  Skin: Negative.   Allergic/Immunologic: Negative.   Neurological: Positive for headaches.  Psychiatric/Behavioral: Positive for agitation and sleep disturbance.      Objective:    Physical Exam  Constitutional: She is oriented to person, place, and time. She appears well-developed and well-nourished.  HENT:  Head: Normocephalic.  Eyes: Pupils are equal, round, and reactive to light. EOM are normal.  Neck: Normal range of motion.  Cardiovascular: Normal rate and regular rhythm.  Pulmonary/Chest: She is in respiratory distress.  Abdominal: Soft. Bowel sounds are normal. She exhibits distension.  Musculoskeletal: Normal range of motion.  Neurological: She is alert and oriented to person, place, and time.  Skin: Skin is warm and dry.  Psychiatric: She has a normal mood and affect.    BP 102/64 (BP Location: Left Arm, Patient Position: Sitting, Cuff Size: Large)   Pulse 96   Temp 98.5 F (36.9 C) (Oral)   Ht 5' (1.524 m)   Wt 201 lb 12.8 oz (91.5 kg)   LMP  (LMP Unknown)   SpO2 97%   BMI 39.41 kg/m  Wt Readings from Last 3 Encounters:  12/25/18 201 lb 12.8 oz (91.5 kg)  12/19/18 202 lb 3.2 oz (91.7 kg)  12/18/18 202 lb 3.2 oz (91.7 kg)     Health Maintenance Due  Topic Date Due  . FOOT EXAM  04/09/1974  . OPHTHALMOLOGY EXAM  04/09/1974  . Fecal DNA (Cologuard)  04/09/2014    There are no preventive care reminders to display for this patient.  Lab Results  Component Value Date   TSH 2.240 11/27/2018   Lab Results  Component Value Date   WBC 11.6 (H) 12/19/2018   HGB 12.0 12/19/2018   HCT  36.3 12/19/2018   MCV 91.1 12/19/2018   PLT 321.0 12/19/2018   Lab Results  Component Value Date   NA 143 11/27/2018   K 4.2 11/27/2018   CO2 20 11/27/2018   GLUCOSE 121 (H) 11/27/2018   BUN 18 11/27/2018   CREATININE 0.92 11/27/2018   BILITOT 0.3 09/17/2018   ALKPHOS 142 (H) 09/19/2018   AST 20 09/17/2018   ALT 24 09/17/2018   PROT 7.3 09/17/2018   ALBUMIN 4.6 09/17/2018   CALCIUM 9.9 11/27/2018   ANIONGAP 7 09/10/2018   Lab Results  Component Value Date   CHOL 218 (H) 01/09/2018   Lab Results  Component Value Date   HDL 41 01/09/2018   Lab Results  Component Value Date   LDLCALC 152 (H) 01/09/2018   Lab Results  Component Value Date   TRIG 123 01/09/2018   Lab Results  Component Value Date   CHOLHDL 5.3 (H) 01/09/2018   Lab Results  Component Value Date   HGBA1C 6.6 (A) 10/30/2018      Assessment & Plan:  1. Upper airway cough syndrome Followed by Dr.Wert pulmonary   2. Dyspnea, unspecified type Secondary to CHF  3. Cigarette smoker Each visit discuss cessation  4. Chronic systolic congestive heart failure (HCC) Followed Dr. Gala Romney in the heart failure clinic   5. Depression with anxiety Was seen by CSW h/a withdrawals  - escitalopram (LEXAPRO) 20 MG tablet; Take 1 tablet (20 mg total) by mouth daily.  Dispense: 30 tablet; Refill: 5  6. Medication refill alllopurinol (ZYLOPRIM) 100 MG tablet;  Take 1 tablet (100 mg total) by mouth 2 (two) times daily.  Dispense: 60 tablet; Refill: 5  7. Elevated lipoprotein Continue  - atorvastatin (LIPITOR) 40 MG tablet; Take 1 tablet (40 mg total) by mouth daily.  Dispense: 90 tablet; Refill: 0  8. Insomnia, unspecified type Refilled to take prn not every night - zolpidem (AMBIEN) 5 MG tablet; Take 1 tablet (5 mg total) by mouth at bedtime as needed. for sleep  Dispense: 30 tablet; Refill: 0   Follow-up: No follow-ups on file.    Grayce Sessions, NP

## 2018-12-25 NOTE — Patient Instructions (Signed)

## 2018-12-28 NOTE — BH Specialist Note (Signed)
Integrated Behavioral Health Initial Visit  MRN: [id number] Name: Stacey Lin  Number of [place] Clinician visits:: 1/6 Session Start time: 3:00 PM  Session End time: 3:30 PM Total time: 30 minutes  Type of Service: [place] Interpretor:No. Interpretor Name and Language: N/A   Warm Hand Off Completed.       SUBJECTIVE: Stacey Lin is a 53 y.o. female accompanied by self Patient was referred by NP Edwards for depression and anxiety. Patient reports the following symptoms/concerns: Pt reports difficulty managing physical and mental health due to ongoing stress from family Duration of problem: Ongoing; Severity of problem: moderate  OBJECTIVE: Mood: Depressed and Affect: Tearful Risk of harm to self or others: No plan to harm self or others  LIFE CONTEXT: Family and Social: Pt has two adult children. She assists son with her grandchildren and their mother School/Work: Pt is insured. Reports no financial strain  Self-Care: During session, pt shared that she has difficulty establishing boundaries with family. She does not engage in self-care activities due to always helping family Life Changes: Pt has difficulty managing ongoing physical and mental health. She is experiencing an increase in stress from caring for multiple households, including hers  GOALS ADDRESSED: Patient will: 1. Reduce symptoms of: anxiety, depression and stress 2. Increase knowledge and/or ability of: coping skills and healthy habits  3. Demonstrate ability to: Improve medication compliance and establish healthy boundaries   INTERVENTIONS: Interventions utilized: Solution-Focused Strategies, Supportive Counseling and Psychoeducation and/or Health Education  Standardized Assessments completed: GAD-7 and PHQ 2&9  ASSESSMENT: Patient currently experiencing depression and anxiety. Pt has difficulty managing ongoing physical and mental health. She is  experiencing an increase in stress from caring for multiple households, including hers. She reports ongoing pain in her shoulder and knees. Denies SI/HI/AVH   Patient disclosed that she has been out of medications to address her mental health for two months. LCSWA educated pt on the correlation between one's physical and mental health, in addition, to how stress can negatively impact both. Therapeutic interventions were discussed to assist pt in decreasing and/or managing symptoms. Pt has motivation to re-initiate medication management through PCP   PLAN: 1. Follow up with behavioral health clinician on : Pt was encouraged to contact LCSWA if symptoms worsen or fail to improve to schedule behavioral appointments at [facility]. 2. Behavioral recommendations: LCSWA recommends that pt apply healthy coping skills discussed. Pt is encouraged to schedule follow up appointment with LCSWA 3. Referral(s): [place] (In Clinic) 4. "From scale of 1-10, how likely are you to follow plan?": [address]  Rebekah Chesterfield, LCSW 12/28/2018 9:25 AM

## 2019-01-21 ENCOUNTER — Other Ambulatory Visit (HOSPITAL_COMMUNITY): Payer: Self-pay

## 2019-01-21 MED ORDER — FUROSEMIDE 40 MG PO TABS: 40.0000 mg | ORAL_TABLET | Freq: Every day | ORAL | 3 refills | Status: DC

## 2019-01-22 ENCOUNTER — Telehealth (HOSPITAL_COMMUNITY): Payer: Self-pay | Admitting: *Deleted

## 2019-01-22 NOTE — Telephone Encounter (Signed)
Received fax from [facility], pt needs clearance for R total knee replacement.  Per Dr Haroldine Laws: "No further cardiac testing needed, pt optimized for surgery from a cardiac standpoint only"  Note faxed back to them at [phone] att: Claiborne Billings

## 2019-01-29 ENCOUNTER — Ambulatory Visit (INDEPENDENT_AMBULATORY_CARE_PROVIDER_SITE_OTHER): Payer: Self-pay | Admitting: Primary Care

## 2019-01-29 ENCOUNTER — Encounter: Payer: Self-pay | Admitting: Primary Care

## 2019-01-29 ENCOUNTER — Ambulatory Visit: Payer: Medicare HMO | Attending: Primary Care | Admitting: Primary Care

## 2019-01-29 ENCOUNTER — Other Ambulatory Visit: Payer: Self-pay

## 2019-01-29 DIAGNOSIS — F338 Other recurrent depressive disorders: Secondary | ICD-10-CM

## 2019-01-29 DIAGNOSIS — B372 Candidiasis of skin and nail: Secondary | ICD-10-CM | POA: Diagnosis not present

## 2019-01-29 DIAGNOSIS — F418 Other specified anxiety disorders: Secondary | ICD-10-CM | POA: Diagnosis not present

## 2019-01-29 DIAGNOSIS — G4700 Insomnia, unspecified: Secondary | ICD-10-CM | POA: Diagnosis not present

## 2019-01-29 DIAGNOSIS — F17210 Nicotine dependence, cigarettes, uncomplicated: Secondary | ICD-10-CM

## 2019-01-29 MED ORDER — HYDROXYZINE HCL 25 MG PO TABS: 25.0000 mg | ORAL_TABLET | Freq: Every evening | ORAL | 0 refills | Status: DC | PRN

## 2019-01-29 MED ORDER — NYSTATIN-TRIAMCINOLONE 100000-0.1 UNIT/GM-% EX CREA: TOPICAL_CREAM | Freq: Two times a day (BID) | CUTANEOUS | Status: DC

## 2019-01-29 MED ORDER — NYSTATIN-TRIAMCINOLONE 100000-0.1 UNIT/GM-% EX OINT: 1.0000 "application " | TOPICAL_OINTMENT | Freq: Two times a day (BID) | CUTANEOUS | 0 refills | Status: DC

## 2019-01-29 NOTE — Progress Notes (Signed)
Anxiety and depression   Need cream for fungus under breast

## 2019-01-29 NOTE — Addendum Note (Signed)
Addended by: Trecia Rogers on: 01/29/2019 11:58 AM   Modules accepted: Orders

## 2019-01-29 NOTE — Progress Notes (Signed)
 Acute Office Visit  Subjective:    Patient ID: Stacey Lin, female    DOB: 03/27/1966, 52 y.o.   MRN: [id number]  Chief Complaint  Patient presents with  . Anxiety  . Depression    Anxiety  Presents for follow-up (when angry feels like wants to slap them up side the head) visit. Symptoms include depressed mood, nervous/anxious behavior and suicidal ideas. Symptoms occur most days. The most recent episode lasted 7 days. The severity of symptoms is moderate. The quality of sleep is fair. Nighttime awakenings: several.   Compliance with medications is 26-50%.  Depression         Associated symptoms include suicidal ideas.  Past medical history includes anxiety.    Patient is also concern with rash under her breast and " itching herself to death."  Past Medical History:  Diagnosis Date  . Anemia   . Anginal pain (HCC)    2 months  . Arrhythmia   . Asthma   . CHF (congestive heart failure) (HCC)    1990s  . Chronic headaches   . COPD (chronic obstructive pulmonary disease) (HCC)    ??  . Hypercholesteremia   . Hypertension     Past Surgical History:  Procedure Laterality Date  . LEFT AND RIGHT HEART CATHETERIZATION WITH CORONARY ANGIOGRAM N/A 06/06/2014   Procedure: LEFT AND RIGHT HEART CATHETERIZATION WITH CORONARY ANGIOGRAM;  Surgeon: Daniel R Bensimhon, MD;  Location: [facility] CATH LAB;  Service: Cardiovascular;  Laterality: N/A;  . MULTIPLE EXTRACTIONS WITH ALVEOLOPLASTY Bilateral 07/24/2015   Procedure: MULTIPLE EXTRACTIONS WITH ALVEOLOPLASTY,  BILATERAL TORI ;  Surgeon: Scott Jensen, DDS;  Location: [facility] OR;  Service: Oral Surgery;  Laterality: Bilateral;  . TUBAL LIGATION  1986    Family History  Problem Relation Age of Onset  . Hypertension Mother   . Allergies Mother   . Asthma Mother   . Heart disease Mother   . Stomach cancer Mother        Deceased, 75  . Seizures Mother   . Hypertension Father        Deceased  . Hypertension Maternal Grandmother   .  Healthy Brother   . Healthy Son   . Healthy Daughter     Social History   Socioeconomic History  . Marital status: Legally Separated    Spouse name: Not on file  . Number of children: Not on file  . Years of education: Not on file  . Highest education level: Not on file  Occupational History  . Not on file  Social Needs  . Financial resource strain: Not on file  . Food insecurity:    Worry: Not on file    Inability: Not on file  . Transportation needs:    Medical: Not on file    Non-medical: Not on file  Tobacco Use  . Smoking status: Current Every Day Smoker    Packs/day: 1.00    Years: 35.00    Pack years: 35.00    Types: Cigarettes  . Smokeless tobacco: Never Used  . Tobacco comment: currently 2 cigs per day 12/19/2018  Substance and Sexual Activity  . Alcohol use: No    Alcohol/week: 0.0 standard drinks  . Drug use: Not Currently    Frequency: 4.0 times per week    Types: "Crack" cocaine    Comment: RELAPSE 12/2016  . Sexual activity: Not on file  Lifestyle  . Physical activity:    Days per week: Not on file    Minutes    Acute Office Visit  Subjective:    Patient ID: Stacey Lin, female    DOB: 03/27/1966, 52 y.o.   MRN: [id number]  Chief Complaint  Patient presents with  . Anxiety  . Depression    Anxiety  Presents for follow-up (when angry feels like wants to slap them up side the head) visit. Symptoms include depressed mood, nervous/anxious behavior and suicidal ideas. Symptoms occur most days. The most recent episode lasted 7 days. The severity of symptoms is moderate. The quality of sleep is fair. Nighttime awakenings: several.   Compliance with medications is 26-50%.  Depression         Associated symptoms include suicidal ideas.  Past medical history includes anxiety.    Patient is also concern with rash under her breast and " itching herself to death."  Past Medical History:  Diagnosis Date  . Anemia   . Anginal pain (HCC)    2 months  . Arrhythmia   . Asthma   . CHF (congestive heart failure) (HCC)    1990s  . Chronic headaches   . COPD (chronic obstructive pulmonary disease) (HCC)    ??  . Hypercholesteremia   . Hypertension     Past Surgical History:  Procedure Laterality Date  . LEFT AND RIGHT HEART CATHETERIZATION WITH CORONARY ANGIOGRAM N/A 06/06/2014   Procedure: LEFT AND RIGHT HEART CATHETERIZATION WITH CORONARY ANGIOGRAM;  Surgeon: Daniel R Bensimhon, MD;  Location: [facility] CATH LAB;  Service: Cardiovascular;  Laterality: N/A;  . MULTIPLE EXTRACTIONS WITH ALVEOLOPLASTY Bilateral 07/24/2015   Procedure: MULTIPLE EXTRACTIONS WITH ALVEOLOPLASTY,  BILATERAL TORI ;  Surgeon: Scott Jensen, DDS;  Location: [facility] OR;  Service: Oral Surgery;  Laterality: Bilateral;  . TUBAL LIGATION  1986    Family History  Problem Relation Age of Onset  . Hypertension Mother   . Allergies Mother   . Asthma Mother   . Heart disease Mother   . Stomach cancer Mother        Deceased, 75  . Seizures Mother   . Hypertension Father        Deceased  . Hypertension Maternal Grandmother   .  Healthy Brother   . Healthy Son   . Healthy Daughter     Social History   Socioeconomic History  . Marital status: Legally Separated    Spouse name: Not on file  . Number of children: Not on file  . Years of education: Not on file  . Highest education level: Not on file  Occupational History  . Not on file  Social Needs  . Financial resource strain: Not on file  . Food insecurity:    Worry: Not on file    Inability: Not on file  . Transportation needs:    Medical: Not on file    Non-medical: Not on file  Tobacco Use  . Smoking status: Current Every Day Smoker    Packs/day: 1.00    Years: 35.00    Pack years: 35.00    Types: Cigarettes  . Smokeless tobacco: Never Used  . Tobacco comment: currently 2 cigs per day 12/19/2018  Substance and Sexual Activity  . Alcohol use: No    Alcohol/week: 0.0 standard drinks  . Drug use: Not Currently    Frequency: 4.0 times per week    Types: "Crack" cocaine    Comment: RELAPSE 12/2016  . Sexual activity: Not on file  Lifestyle  . Physical activity:    Days per week: Not on file    Minutes   Acute Office Visit  Subjective:    Patient ID: Stacey Lin, female    DOB: 1966-07-18, 53 y.o.   MRN: [id number]  Chief Complaint  Patient presents with  . Anxiety  . Depression    Anxiety  Presents for follow-up (when angry feels like wants to slap them up side the head) visit. Symptoms include depressed mood, nervous/anxious behavior and suicidal ideas. Symptoms occur most days. The most recent episode lasted 7 days. The severity of symptoms is moderate. The quality of sleep is fair. Nighttime awakenings: several.   Compliance with medications is 26-50%.  Depression         Associated symptoms include suicidal ideas.  Past medical history includes anxiety.    Patient is also concern with rash under her breast and " itching herself to death."  Past Medical History:  Diagnosis Date  . Anemia   . Anginal pain ([place])    2 months  . Arrhythmia   . Asthma   . CHF (congestive heart failure) ([place])    1990s  . Chronic headaches   . COPD (chronic obstructive pulmonary disease) ([place])    ??  . Hypercholesteremia   . Hypertension     Past Surgical History:  Procedure Laterality Date  . LEFT AND RIGHT HEART CATHETERIZATION WITH CORONARY ANGIOGRAM N/A 06/06/2014   Procedure: LEFT AND RIGHT HEART CATHETERIZATION WITH CORONARY ANGIOGRAM;  Surgeon: Jolaine Artist, MD;  Location: [facility] CATH LAB;  Service: Cardiovascular;  Laterality: N/A;  . MULTIPLE EXTRACTIONS WITH ALVEOLOPLASTY Bilateral 07/24/2015   Procedure: MULTIPLE EXTRACTIONS WITH ALVEOLOPLASTY,  BILATERAL TORI ;  Surgeon: Diona Browner, DDS;  Location: [facility];  Service: Oral Surgery;  Laterality: Bilateral;  . TUBAL LIGATION  1986    Family History  Problem Relation Age of Onset  . Hypertension Mother   . Allergies Mother   . Asthma Mother   . Heart disease Mother   . Stomach cancer Mother        Deceased, 106  . Seizures Mother   . Hypertension Father        Deceased  . Hypertension Maternal Grandmother   .  Healthy Brother   . Healthy Son   . Healthy Daughter     Social History   Socioeconomic History  . Marital status: Legally Separated    Spouse name: Not on file  . Number of children: Not on file  . Years of education: Not on file  . Highest education level: Not on file  Occupational History  . Not on file  Social Needs  . Financial resource strain: Not on file  . Food insecurity:    Worry: Not on file    Inability: Not on file  . Transportation needs:    Medical: Not on file    Non-medical: Not on file  Tobacco Use  . Smoking status: Current Every Day Smoker    Packs/day: 1.00    Years: 35.00    Pack years: 35.00    Types: Cigarettes  . Smokeless tobacco: Never Used  . Tobacco comment: currently 2 cigs per day 12/19/2018  Substance and Sexual Activity  . Alcohol use: No    Alcohol/week: 0.0 standard drinks  . Drug use: Not Currently    Frequency: 4.0 times per week    Types: "Crack" cocaine    Comment: RELAPSE 12/2016  . Sexual activity: Not on file  Lifestyle  . Physical activity:    Days per week: Not on file    Minutes

## 2019-01-30 ENCOUNTER — Other Ambulatory Visit (HOSPITAL_COMMUNITY): Payer: Self-pay | Admitting: Internal Medicine

## 2019-02-06 ENCOUNTER — Ambulatory Visit: Payer: Self-pay | Admitting: Internal Medicine

## 2019-02-27 ENCOUNTER — Other Ambulatory Visit (HOSPITAL_COMMUNITY): Payer: Self-pay

## 2019-02-27 MED ORDER — SPIRONOLACTONE 25 MG PO TABS: ORAL_TABLET | ORAL | 1 refills | Status: DC

## 2019-03-20 ENCOUNTER — Encounter (HOSPITAL_COMMUNITY): Payer: Self-pay | Admitting: Internal Medicine

## 2019-03-26 ENCOUNTER — Ambulatory Visit: Payer: Medicare HMO | Admitting: Internal Medicine

## 2019-04-17 ENCOUNTER — Ambulatory Visit: Payer: Self-pay | Admitting: Internal Medicine

## 2019-04-22 ENCOUNTER — Other Ambulatory Visit (HOSPITAL_COMMUNITY): Payer: Self-pay

## 2019-04-22 MED ORDER — SPIRONOLACTONE 25 MG PO TABS: ORAL_TABLET | ORAL | 1 refills | Status: DC

## 2019-05-02 ENCOUNTER — Other Ambulatory Visit (INDEPENDENT_AMBULATORY_CARE_PROVIDER_SITE_OTHER): Payer: Self-pay | Admitting: Primary Care

## 2019-05-02 DIAGNOSIS — E7841 Elevated Lipoprotein(a): Secondary | ICD-10-CM

## 2019-05-16 ENCOUNTER — Encounter (HOSPITAL_COMMUNITY): Payer: Medicare HMO | Admitting: Internal Medicine

## 2019-05-17 ENCOUNTER — Ambulatory Visit: Payer: Self-pay | Admitting: Internal Medicine

## 2019-05-17 ENCOUNTER — Other Ambulatory Visit: Payer: Self-pay | Admitting: Internal Medicine

## 2019-05-20 ENCOUNTER — Other Ambulatory Visit (HOSPITAL_COMMUNITY): Payer: Self-pay | Admitting: Internal Medicine

## 2019-05-24 ENCOUNTER — Other Ambulatory Visit (HOSPITAL_COMMUNITY): Payer: Medicare HMO | Attending: Internal Medicine

## 2019-05-24 ENCOUNTER — Inpatient Hospital Stay (HOSPITAL_COMMUNITY): Admission: RE | Admit: 2019-05-24 | Payer: Medicare HMO | Source: Ambulatory Visit

## 2019-05-27 ENCOUNTER — Other Ambulatory Visit: Payer: Self-pay | Admitting: Internal Medicine

## 2019-05-27 DIAGNOSIS — R05 Cough: Secondary | ICD-10-CM

## 2019-05-27 DIAGNOSIS — J4520 Mild intermittent asthma, uncomplicated: Secondary | ICD-10-CM

## 2019-05-27 DIAGNOSIS — R058 Other specified cough: Secondary | ICD-10-CM

## 2019-05-28 ENCOUNTER — Ambulatory Visit: Payer: Medicare HMO | Admitting: Internal Medicine

## 2019-06-05 ENCOUNTER — Other Ambulatory Visit (HOSPITAL_COMMUNITY): Payer: Self-pay | Admitting: Internal Medicine

## 2019-06-14 ENCOUNTER — Other Ambulatory Visit (HOSPITAL_COMMUNITY): Payer: Self-pay | Admitting: Internal Medicine

## 2019-06-14 ENCOUNTER — Other Ambulatory Visit (HOSPITAL_COMMUNITY): Payer: Self-pay

## 2019-06-14 MED ORDER — SPIRONOLACTONE 25 MG PO TABS: ORAL_TABLET | ORAL | 5 refills | Status: DC

## 2019-06-28 ENCOUNTER — Ambulatory Visit (INDEPENDENT_AMBULATORY_CARE_PROVIDER_SITE_OTHER): Payer: Medicare HMO | Admitting: Primary Care

## 2019-07-05 ENCOUNTER — Ambulatory Visit (INDEPENDENT_AMBULATORY_CARE_PROVIDER_SITE_OTHER): Payer: Medicare HMO | Admitting: Primary Care

## 2019-07-17 ENCOUNTER — Ambulatory Visit (INDEPENDENT_AMBULATORY_CARE_PROVIDER_SITE_OTHER): Payer: Medicare HMO | Admitting: Primary Care

## 2019-07-28 ENCOUNTER — Other Ambulatory Visit (INDEPENDENT_AMBULATORY_CARE_PROVIDER_SITE_OTHER): Payer: Self-pay | Admitting: Primary Care

## 2019-07-28 DIAGNOSIS — E7841 Elevated Lipoprotein(a): Secondary | ICD-10-CM

## 2019-07-31 ENCOUNTER — Other Ambulatory Visit: Payer: Self-pay | Admitting: *Deleted

## 2019-07-31 ENCOUNTER — Ambulatory Visit (INDEPENDENT_AMBULATORY_CARE_PROVIDER_SITE_OTHER): Payer: Medicare HMO | Admitting: Primary Care

## 2019-07-31 NOTE — Patient Outreach (Signed)
Order previously placed for covid screening 05/17/19 Nothing further needed.

## 2019-08-06 ENCOUNTER — Ambulatory Visit (INDEPENDENT_AMBULATORY_CARE_PROVIDER_SITE_OTHER): Payer: Medicare HMO | Admitting: Primary Care

## 2019-08-12 ENCOUNTER — Other Ambulatory Visit (HOSPITAL_COMMUNITY): Admission: RE | Admit: 2019-08-12 | Payer: Medicare HMO | Source: Ambulatory Visit

## 2019-08-13 ENCOUNTER — Telehealth: Payer: Self-pay | Admitting: Internal Medicine

## 2019-08-13 ENCOUNTER — Other Ambulatory Visit (HOSPITAL_COMMUNITY)
Admission: RE | Admit: 2019-08-13 | Discharge: 2019-08-13 | Disposition: A | Discharge status: Home / Self Care | Payer: Medicare HMO | Source: Ambulatory Visit | Attending: Internal Medicine | Admitting: Internal Medicine

## 2019-08-13 DIAGNOSIS — Z01812 Encounter for preprocedural laboratory examination: Secondary | ICD-10-CM | POA: Insufficient documentation

## 2019-08-13 DIAGNOSIS — Z20828 Contact with and (suspected) exposure to other viral communicable diseases: Secondary | ICD-10-CM | POA: Insufficient documentation

## 2019-08-13 NOTE — Telephone Encounter (Signed)
Called and spoke to patient. Patient stated she got her appointment dates/times mixed up. Patient asked if she could go now.  Scheduled patient for pre-procedure covid testing for 12:45 since patient will be on her way. Nothing further needed at this time.

## 2019-08-14 LAB — NOVEL CORONAVIRUS, NAA (HOSP ORDER, SEND-OUT TO REF LAB; TAT 18-24 HRS): SARS-CoV-2, NAA: NOT DETECTED

## 2019-08-15 NOTE — Progress Notes (Signed)
Result posted in [place]

## 2019-08-16 ENCOUNTER — Ambulatory Visit: Payer: Medicare HMO

## 2019-08-16 ENCOUNTER — Other Ambulatory Visit: Payer: Self-pay | Admitting: Primary Care

## 2019-08-16 ENCOUNTER — Ambulatory Visit (INDEPENDENT_AMBULATORY_CARE_PROVIDER_SITE_OTHER): Payer: Medicare HMO | Admitting: Internal Medicine

## 2019-08-16 ENCOUNTER — Encounter: Payer: Self-pay | Admitting: Internal Medicine

## 2019-08-16 ENCOUNTER — Other Ambulatory Visit: Payer: Self-pay

## 2019-08-16 DIAGNOSIS — J4541 Moderate persistent asthma with (acute) exacerbation: Secondary | ICD-10-CM | POA: Diagnosis not present

## 2019-08-16 DIAGNOSIS — Z1231 Encounter for screening mammogram for malignant neoplasm of breast: Secondary | ICD-10-CM

## 2019-08-16 DIAGNOSIS — F17210 Nicotine dependence, cigarettes, uncomplicated: Secondary | ICD-10-CM

## 2019-08-16 MED ORDER — GABAPENTIN 100 MG PO CAPS: 100.0000 mg | ORAL_CAPSULE | Freq: Four times a day (QID) | ORAL | 2 refills | Status: DC

## 2019-08-16 MED ORDER — PREDNISONE 10 MG PO TABS: ORAL_TABLET | ORAL | 0 refills | Status: DC

## 2019-08-16 MED ORDER — GABAPENTIN 100 MG PO CAPS: ORAL_CAPSULE | ORAL | 2 refills | Status: DC

## 2019-08-16 MED ORDER — AZITHROMYCIN 250 MG PO TABS: ORAL_TABLET | ORAL | 0 refills | Status: DC

## 2019-08-16 NOTE — Progress Notes (Signed)
Subjective:    Patient ID: Stacey Lin, female    DOB: May 30, 1966   MRN: [id number]    Brief patient profile:  10 yobf active smoker previously seen by Dr. Shelle Iron 12/18/14 with dyspnea on exertion.She was felt to have asthmatic bronchitis but not COPD  rec Stop spiriva rx breo 100, take one inhalation each am proair respiclick, 2 inhalations every 6 hrs only if you are having severe breathing issues. Work on weight loss and conditioning. Will send a note to your cardiologist to see about getting you off lisinopril to see if this helps your dry tickling cough. You must stop smoking 100% in order to stay well.  followup with me again in 2mos to check on things> did not return.   History of Present Illness  02/12/2016: NP Follow up Office Visit: Patient presents to the office today with worsening dyspnea on exertion. She was seen at the heart failure clinic one day prior to OV   and they requested that she follow up with pulmonary. Spirometry 11/2014 showed  normal FEV1 with ? air trapping given her   FVC ? 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 She is continuing to smoke approx. 2 cigarettes daily per her history.    she has not been using her Breo inhaler daily as  Maintenance medication, but just when needed.   rec We will give you a Breo sample and new prescription for your Breo.( Maintenance inhaler) Use the Breo 1 puff twice daily. We will give you a prescription for your Pro Air Inhaler ( Rescue Inhaler) Use this every 6 hours as needed for SOB or Wheezing. Claritin ( loratadine) 1 every day for allergies Nasal Saline as needed for nasal congestion Continue weighing yourself daily per the heart failure clinic Follow up in 1 month > did  Not return        11/20/2018  Acute  ov/Lalo Tromp re: worse since stopped symbicort  Chief Complaint  Patient presents with  . Acute Visit    Increased SOB for the past 2 months. She gets winded walking from room to room at home. She also c/o  occ cough and wheezing. Her cough is mainly non prod.  She is using her albuterol inhaler 2 x per wk on average.   Dyspnea:  Gradually worse x 2 months to point to doe  Room to room = MMRC3  Cough: acutely worse x 2 days / harsh esp daytime > noct  Sleeping: on back with 2 pillows SABA use: confused with which ones help and when to take  rec Plan A = Automatic = symbicort 80 Take 2 puffs first thing in am and then another 2 puffs about 12 hours later.  Work on inhaler technique  The key is to stop smoking completely before smoking completely stops you!  Plan B = Backup Only use your albuterol (ventolin) inhaler  Please schedule a follow up office visit in 2  weeks, call sooner if needed with all medications /inhalers/ solutions in hand so we can verify exactly what you are taking. This includes all medications from all doctors and over the counters - PLEASE separate them into two bags:  the ones you take automatically, no matter what, vs the ones you take just when you feel you need them "BAG #2 is UP TO YOU"  - this will really help [place] help you take your medications more effectively.  - add: Prednisone 10 mg take  4 each am x 2 days,  2 each am x 2 days,  1 each am x 2 days and stop and spirometry on return     12/04/2018  f/u ov/Kemal Amores re:  AB / non adherence, still smoking  Chief Complaint  Patient presents with  . Follow-up    Breathing is unchanged. She never started the pred taper b/c she did not understand the instructions. She has not had to use her albuterol inhaler. She has had occ cough and wheezing at night.   Dyspnea:  MMRC3 = can't walk 100 yards even at a slow pace at a flat grade s stopping due to sob   Cough: daytime / not productive  Sleeping: ok after ambien flat bed 2 pillows  SABA use: not using as long as on symbbicort  02: none  rec  Prednisone 10 mg take  4 each am x 2 days,   2 each am x 2 days,  1 each am x 2 days and stop  Work on inhaler technique:   Plan A =  Automatic = Symbicort 80 Take 2 puffs first thing in am and then another 2 puffs about 12 hours later.  Plan B = Backup Only use your albuterol inhaler as a rescue medication Please schedule a follow up office visit in 2 weeks, sooner if needed  with all medications /inhalers/ solutions in hand so we can verify exactly what you are taking. This includes all medications from all doctors and over the counters     12/19/2018  f/u ov/Ricci Dirocco re: cough x 3 months / maint on symbicort 80 2bid / still smoking Chief Complaint  Patient presents with  . Follow-up    Breathing is doing some better and she is coughing less. She has not had to use her albuterol inhaler.   Dyspnea:  Can't do [employer] due to sob and  R knee pain  Cough: more day than noct and dry Sleeping: bed is flat/ 2 pillows  SABA : not using at present  02: no Using lots of mints  rec Stop smoking if at all possible Brush your tongue with [employer] toothpaste and garlge with the slurry Prednisone 10 mg take  4 each am x 2 days,   2 each am x 2 days,  1 each am x 2 days and stop to see if helps  As needed > tessalon 200 mg every 6 hours as needed  GERD diet Please remember to go to the lab department   for your tests - we will call you with the results when they are available.    Please schedule a follow up office visit in 6 weeks, call sooner if needed with PFTs on return       08/16/2019  f/u ov/Jorita Bohanon re: still smoking /never better fro last eval  Chief Complaint  Patient presents with  . Follow-up    C10: asthmatic bronchitis pt unable to perform PFT due to cough and congestion today.Pt has been coughing clear mucus for the past 3 days. She had covid testin10/20 and was not feeling well.  . Nasal Congestion    pt states cough is making her feel nauseous. Pt has not have fever, chills or bodyaches. Pt says today she has sore throat.  Dyspnea:  Room to room  Cough: clear mucus  Sleeping: 45 degrees on pillows x years   SABA use: helps 02: none    No obvious day to day or daytime variability or assoc excess/ purulent sputum or mucus  plugs or hemoptysis or cp or chest tightness, subjective wheeze or overt sinus or hb symptoms.     Also denies any obvious fluctuation of symptoms with weather or environmental changes or other aggravating or alleviating factors except as outlined above   No unusual exposure hx or h/o childhood pna/ asthma or knowledge of premature birth.  Current Allergies, Complete Past Medical History, Past Surgical History, Family History, and Social History were reviewed in [employer] record.  ROS  The following are not active complaints unless bolded Hoarseness, sore throat, dysphagia, dental problems, itching, sneezing,  nasal congestion or discharge of excess mucus or purulent secretions, ear ache,   fever, chills, sweats, unintended wt loss or wt gain, classically pleuritic or exertional cp,  orthopnea pnd or arm/hand swelling  or leg swelling, presyncope, palpitations, abdominal pain, anorexia, nausea, vomiting, diarrhea  or change in bowel habits or change in bladder habits, change in stools or change in urine, dysuria, hematuria,  rash, arthralgias, visual complaints, headache, numbness, weakness or ataxia or problems with walking or coordination,  change in mood or  memory.        Current Meds  Medication Sig  . albuterol (PROVENTIL HFA;VENTOLIN HFA) 108 (90 Base) MCG/ACT inhaler Inhale 2 puffs into the lungs every 6 (six) hours as needed for wheezing or shortness of breath.  . allopurinol (ZYLOPRIM) 100 MG tablet Take 1 tablet (100 mg total) by mouth 2 (two) times daily.  Marland Kitchen atorvastatin (LIPITOR) 40 MG tablet Take 1 tablet (40 mg total) by mouth daily. Must keep upcoming office visit for refills.  . Blood Glucose Monitoring Suppl (ACCU-CHEK AVIVA PLUS) w/Device KIT 1 each by Does not apply route 3 (three) times daily.  . budesonide-formoterol (SYMBICORT)  80-4.5 MCG/ACT inhaler Take 2 puffs first thing in am and then another 2 puffs about 12 hours later.  . carvedilol (COREG) 3.125 MG tablet TAKE 1 TABLET BY MOUTH TWICE DAILY  . ENTRESTO 49-51 MG TAKE 1 TABLET BY MOUTH TWICE DAILY  . escitalopram (LEXAPRO) 20 MG tablet Take 1 tablet (20 mg total) by mouth daily.  . fluticasone (FLONASE) 50 MCG/ACT nasal spray Place 2 sprays into both nostrils daily. As needed for nasal vongestion  . furosemide (LASIX) 40 MG tablet Take 1 tablet (40 mg total) by mouth daily.  Marland Kitchen gabapentin (NEURONTIN) 100 MG capsule Take 100 mg by mouth at bedtime.  Marland Kitchen glimepiride (AMARYL) 2 MG tablet TAKE 1 TABLET BY MOUTH DAILY BEFORE BREAKFAST  . glucose blood test strip Use one strip TID  . ketoconazole (NIZORAL) 2 % cream Apply 1 application topically daily.  Marland Kitchen ketotifen (ALAWAY) 0.025 % ophthalmic solution Place 1 drop into both eyes 2 (two) times daily.  . Lancets (ACCU-CHEK SOFT TOUCH) lancets Use one new lancet TID  . nystatin-triamcinolone ointment (MYCOLOG) Apply 1 application topically 2 (two) times daily.  Marland Kitchen oxyCODONE-acetaminophen (PERCOCET/ROXICET) 5-325 MG tablet TK 1 T PO TID PRN  . spironolactone (ALDACTONE) 25 MG tablet TAKE 1 TABLET(25 MG) BY MOUTH DAILY               Objective:   Physical Exam   obese bf harsh barking upper airway pattern cough/ still using mints   08/16/2019      192 12/19/2018        202  12/04/2018        205   11/20/18 201 lb 6.4 oz (91.4 kg)  10/30/18 197 lb (89.4 kg)  09/17/18 198 lb (89.8 kg)  pseudowheeze  resolves with plm    Reports edentulous  HEENT : pt wearing mask not removed for exam due to covid -19 concerns.    NECK :  without JVD/Nodes/TM/ nl carotid upstrokes bilaterally   LUNGS: no acc muscle use,  Nl contour chest which is lots of  with lots of noise transmitted from upper airway  cough on  exp maneuvers   CV:  RRR  no s3 or murmur or increase in P2, and no edema   ABD:  soft and nontender  with nl inspiratory excursion in the supine position. No bruits or organomegaly appreciated, bowel sounds nl  MS:  Nl gait/ ext warm without deformities, calf tenderness, cyanosis or clubbing No obvious joint restrictions   SKIN: warm and dry without lesions    NEURO:  alert, approp, nl sensorium with  no motor or cerebellar deficits apparent.      Labs ordered 08/16/2019  :  allergy profile     covid pcr  08/13/19 neg       Assessment & Plan:

## 2019-08-16 NOTE — Patient Instructions (Addendum)
Work on inhaler technique:  relax and gently blow all the way out then take a nice smooth deep breath back in, triggering the inhaler at same time you start breathing in.  Hold for up to 5 seconds if you can. Blow symbicort out thru nose. Rinse and gargle with water when done  Only use your albuterol as a rescue medication to be used if you can't catch your breath by resting or doing a relaxed purse lip breathing pattern.  - The less you use it, the better it will work when you need it. - Ok to use up to 2 puffs  every 4 hours if you must but call for immediate appointment if use goes up over your usual need - Don't leave home without it !!  (think of it like the spare tire for your car)      Stop all mints   zpak   Prednisone 10 mg take  4 each am x 2 days,   2 each am x 2 days,  1 each am x 2 days and stop   Increase gabapentin to 100 mg 4 x daily -bfast lunch supper and bedtime   The key is to stop smoking completely before smoking completely stops you!      Please schedule a follow up office visit in 2 weeks, sooner if needed  with all medications /inhalers/ solutions in hand so we can verify exactly what you are taking. This includes all medications from all doctors and over the counters - needs cxr on return  - add prilosec 20 mg bid ac

## 2019-08-19 ENCOUNTER — Encounter: Payer: Self-pay | Admitting: Internal Medicine

## 2019-08-19 NOTE — Assessment & Plan Note (Signed)
4-5 min discussion re active cigarette smoking in addition to office E&M  Ask about tobacco use:   ongoing Advise quitting   I took an extended  opportunity with this patient to outline the consequences of continued cigarette use  in airway disorders based on all the data we have from the multiple national lung health studies (perfomed over decades at millions of dollars in cost)  indicating that smoking cessation, not choice of inhalers or physicians, is the most important aspect of care.   Assess willingness:  Not committed at this point Assist in quit attempt:  Per PCP when ready Arrange follow up:   Follow up per Primary Care planned        

## 2019-08-19 NOTE — Assessment & Plan Note (Addendum)
Active smoker PFTs 10/2013:  FEV1 0.86 (41%), ratio 67, TLC 58%, unable to do DLCO Arlyce Harman as part of CPST 02/2014:  FEV1 1.19 (59%), ratio 72 Spiro 11/2014:  FEV1 1.08 (54%), ratio 80 - Spirometry 04/14/2016  FEV1 1.32 (68%)  Ratio 79 with nl effort indep portion of f/v loop  11/20/2018   resume symb 80 2bid x 2 week sample plus prednisone  X 6 days  then regroup with all meds in hand > did not do and as of 12/04/2018 no prednisone taken/still smoking   - Allergy profile 12/19/2018 >  Eos 0. /  IgE  - 08/16/2019  After extensive coaching inhaler device,  effectiveness =    75%   DDX of  difficult airways management almost all start with A and  include Adherence, Ace Inhibitors, Acid Reflux, Active Sinus Disease, Alpha 1 Antitripsin deficiency, Anxiety masquerading as Airways dz,  ABPA,  Allergy(esp in young), Aspiration (esp in elderly), Adverse effects of meds,  Active smoking or vaping, A bunch of PE's (a small clot burden can't cause this syndrome unless there is already severe underlying pulm or vascular dz with poor reserve) plus two Bs  = Bronchiectasis and Beta blocker use..and one C= CHF   Adherence is always the initial "prime suspect" and is a multilayered concern that requires a "trust but verify" approach in every patient - starting with knowing how to use medications, especially inhalers, correctly, keeping up with refills and understanding the fundamental difference between maintenance and prns vs those medications only taken for a very short course and then stopped and not refilled.  -see hfa teaching, need to verify at each ov  - return with all meds in hand using a trust but verify approach to confirm accurate Medication  Reconciliation The principal here is that until we are certain that the  patients are doing what we've asked, it makes no sense to ask them to do more.  Active smoking top of the usual list of suspects (see separate a/p)    ? Acid (or non-acid) GERD > always difficult to  exclude as up to 75% of pts in some series report no assoc GI/ Heartburn symptoms> rec max (24h)  acid suppression and diet restrictions/ reviewed and instructions given in writing. needs to stop all mints  -  Of the three most common causes of  Sub-acute / recurrent or chronic cough, only one (GERD)  can actually contribute to/ trigger  the other two (asthma and post nasal drip syndrome)  and perpetuate the cylce of cough.  While not intuitively obvious, many patients with chronic low grade reflux do not cough until there is a primary insult that disturbs the protective epithelial barrier and exposes sensitive nerve endings.   This is typically viral but can due to PNDS and  either may apply here.   The point is that once this occurs, it is difficult to eliminate the cycle  using anything but a maximally effective acid suppression regimen at least in the short run, accompanied by an appropriate diet to address non acid GERD and control / eliminate the cough itself with gabapentiin 100 qid   ? Allergy > send profile, Prednisone 10 mg take  4 each am x 2 days,   2 each am x 2 days,  1 each am x 2 days and stop  ? Active sinus dz > doubt, so zpak only for now    ? Adverse drug effects > may need to stop entresto  on a trial basis if can't control cough otherwise  ? BB effects > unlikely at this low of a dose of coreg.   >>> regroup in 2 weeks with cxr on return    I had an extended discussion with the patient reviewing all relevant studies completed to date and  lasting 15 to 20 minutes of a 25 minute visit    I performed detailed device teaching using a teach back method which extended face to face time for this visit (see above)  Each maintenance medication was reviewed in detail including emphasizing most importantly the difference between maintenance and prns and under what circumstances the prns are to be triggered using an action plan format that is not reflected in the computer generated  alphabetically organized AVS which I have not found useful in most complex patients, especially with respiratory illnesses  Please see AVS for specific instructions unique to this visit that I personally wrote and verbalized to the the pt in detail and then reviewed with pt  by my nurse highlighting any  changes in therapy recommended at today's visit to their plan of care.

## 2019-08-20 ENCOUNTER — Telehealth: Payer: Self-pay | Admitting: *Deleted

## 2019-08-20 LAB — PULMONARY FUNCTION TEST
FEF 25-75 Pre: 0.65 L/sec
FEF2575-%Pred-Pre: 31 %
FEV1-%Pred-Pre: 43 %
FEV1-Pre: 0.84 L
FEV1FVC-%Pred-Pre: 79 %
FEV6-%Pred-Pre: 55 %
FEV6-Pre: 1.31 L
FEV6FVC-%Pred-Pre: 103 %
FVC-%Pred-Pre: 53 %
FVC-Pre: 1.31 L
Pre FEV1/FVC ratio: 64 %
Pre FEV6/FVC Ratio: 100 %

## 2019-08-20 NOTE — Telephone Encounter (Signed)
-----   Message from Tanda Rockers, MD sent at 08/19/2019  5:53 AM EDT ----- I thought she was already on ppi so rec prilose 20  Take 30- 60 min before your first and last meals of the day otc or rx

## 2019-08-20 NOTE — Telephone Encounter (Signed)
Spoke with the pt and notified of recs per MW  She verbalized understanding  Nothing further needed 

## 2019-08-21 ENCOUNTER — Telehealth: Payer: Self-pay | Admitting: Internal Medicine

## 2019-08-21 NOTE — Telephone Encounter (Signed)
Spoke with patient. She was calling to get the name of the OTC medication Dr. Melvyn Novas recommended for her yesterday. Advised her it was omeprazole. She verbalized understanding.   She also wanted to know if MW would recommend that she uses a vaporizer in her room. She stated that she is still wheezing and having to take NyQuil at night. Asked patient if she meant a nebulizer but she stated it was a vaporizer. Advised her I would send a message over to MW to get his recs on this, she verbalized understanding.   MW, please advise about the vaporizer. Thanks!

## 2019-08-22 ENCOUNTER — Other Ambulatory Visit (HOSPITAL_COMMUNITY): Payer: Self-pay

## 2019-08-22 MED ORDER — ENTRESTO 49-51 MG PO TABS: 1.0000 | ORAL_TABLET | Freq: Two times a day (BID) | ORAL | 0 refills | Status: DC

## 2019-08-22 NOTE — Telephone Encounter (Signed)
I don't know what a vaporizer is but lets hold off additonal measures until we have a chance to eval the recs arleady made

## 2019-08-22 NOTE — Telephone Encounter (Signed)
Spoke with patient.  Let her know Dr. Morrison Old recommendations. She voiced understanding Nothing further needed at this time.

## 2019-09-02 ENCOUNTER — Ambulatory Visit (INDEPENDENT_AMBULATORY_CARE_PROVIDER_SITE_OTHER): Payer: Medicare HMO

## 2019-09-02 ENCOUNTER — Ambulatory Visit (INDEPENDENT_AMBULATORY_CARE_PROVIDER_SITE_OTHER): Payer: Medicare HMO | Admitting: Internal Medicine

## 2019-09-02 ENCOUNTER — Other Ambulatory Visit: Payer: Self-pay

## 2019-09-02 ENCOUNTER — Encounter: Payer: Self-pay | Admitting: Internal Medicine

## 2019-09-02 DIAGNOSIS — J4541 Moderate persistent asthma with (acute) exacerbation: Secondary | ICD-10-CM | POA: Diagnosis not present

## 2019-09-02 DIAGNOSIS — R0600 Dyspnea, unspecified: Secondary | ICD-10-CM | POA: Diagnosis not present

## 2019-09-02 DIAGNOSIS — R05 Cough: Secondary | ICD-10-CM

## 2019-09-02 DIAGNOSIS — R058 Other specified cough: Secondary | ICD-10-CM

## 2019-09-02 DIAGNOSIS — R0609 Other forms of dyspnea: Secondary | ICD-10-CM

## 2019-09-02 MED ORDER — GABAPENTIN 300 MG PO CAPS: 300.0000 mg | ORAL_CAPSULE | Freq: Three times a day (TID) | ORAL | 2 refills | Status: DC

## 2019-09-02 MED ORDER — FAMOTIDINE 20 MG PO TABS: ORAL_TABLET | ORAL | 11 refills | Status: DC

## 2019-09-02 MED ORDER — PANTOPRAZOLE SODIUM 40 MG PO TBEC: 40.0000 mg | DELAYED_RELEASE_TABLET | Freq: Every day | ORAL | 2 refills | Status: DC

## 2019-09-02 NOTE — Patient Instructions (Addendum)
Pantoprazole (protonix) 40 mg   Take  30-60 min before first meal of the day and Pepcid (famotidine)  20 mg one @  bedtime until return to office - this is the best way to tell whether stomach acid is contributing to your problem.    Change gabapentin to 300 mg three times daily   You may have to stop the [place] and start a substitute but I will defer that to Dr Rhys Martini works for cough but it is a last resort   See Tammy NP w/in 2 weeks with all your medications, even over the counter meds, separated in two separate bags, the ones you take no matter what vs the ones you stop once you feel better and take only as needed when you feel you need them.   Tammy  will generate for you a new user friendly medication calendar that will put [place] all on the same page re: your medication use.     Without this process, it simply isn't possible to assure that we are providing  your outpatient care  with  the attention to detail we feel you deserve.   If we cannot assure that you're getting that kind of care,  then we cannot manage your problem effectively from this clinic.  Once you have seen Tammy and we are sure that we're all on the same page with your medication use she will arrange follow up with me.

## 2019-09-02 NOTE — Progress Notes (Signed)
Subjective:    Patient ID: Stacey Lin, female    DOB: 07/16/1966   MRN: [id number]    Brief patient profile:  62 yobf reports quit smoking 08/2019  previously seen by Dr. Gwenette Greet 12/18/14 with dyspnea on exertion.She was felt to have asthmatic bronchitis but not COPD  rec Stop spiriva rx breo 100, take one inhalation each am proair respiclick, 2 inhalations every 6 hrs only if you are having severe breathing issues. Work on weight loss and conditioning. Will send a note to your cardiologist to see about getting you off lisinopril to see if this helps your dry tickling cough. You must stop smoking 100% in order to stay well.  followup with me again in 10mo to check on things> did not return.   History of Present Illness  02/12/2016: NP Follow up Office Visit: Patient presents to the office today with worsening dyspnea on exertion. [place]  and [place] requested that she follow up with pulmonary. Spirometry 11/2014 showed  normal FEV1 with ? air trapping given her   FVC ? 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 She is continuing to smoke approx. 2 cigarettes daily per her history.    she has not been using her Breo inhaler daily as  Maintenance medication, but just when needed.   rec We will give you a Breo sample and new prescription for your Breo.( Maintenance inhaler) Use the Breo 1 puff twice daily. We will give you a prescription for your Pro Air Inhaler ( Rescue Inhaler) Use this every 6 hours as needed for SOB or Wheezing. Claritin ( loratadine) 1 every day for allergies Nasal Saline as needed for nasal congestion Continue weighing yourself daily per the heart failure clinic Follow up in 1 month > did  Not return        11/20/2018  Acute  ov/Londen Bok re: worse since stopped symbicort  Chief Complaint  Patient presents with  . Acute Visit    Increased SOB for the past 2 months. She gets winded walking from room to room at  home. She also c/o occ cough and wheezing. Her cough is mainly non prod.  She is using her albuterol inhaler 2 x per wk on average.   Dyspnea:  Gradually worse x 2 months to point to doe  Room to room = MMRC3  Cough: acutely worse x 2 days / harsh esp daytime > noct  Sleeping: on back with 2 pillows SABA use: confused with which ones help and when to take  rec Plan A = Automatic = symbicort 80 Take 2 puffs first thing in am and then another 2 puffs about 12 hours later.  Work on inhaler technique  The key is to stop smoking completely before smoking completely stops you!  Plan B = Backup Only use your albuterol (ventolin) inhaler  Please schedule a follow up office visit in 2  weeks, call sooner if needed with all medications /inhalers/ solutions in hand so we can verify exactly what you are taking. This includes all medications from all doctors and over the cOhiovilleseparate them into two bags:  the ones you take automatically, no matter what, vs the ones you take just when you feel you need them "BAG #2 is UP TO YOU"  - this will really help u[place] you take your medications more effectively.  - add: Prednisone 10 mg take  4 each am x  2 days,   2 each am x 2 days,  1 each am x 2 days and stop and spirometry on return     12/04/2018  f/u ov/Briteny Fulghum re:  AB / non adherence, still smoking  Chief Complaint  Patient presents with  . Follow-up    Breathing is unchanged. She never started the pred taper b/c she did not understand the instructions. She has not had to use her albuterol inhaler. She has had occ cough and wheezing at night.   Dyspnea:  MMRC3 = can't walk 100 yards even at a slow pace at a flat grade s stopping due to sob   Cough: daytime / not productive  Sleeping: ok after ambien flat bed 2 pillows  SABA use: not using as long as on symbbicort  02: none  rec  Prednisone 10 mg take  4 each am x 2 days,   2 each am x 2 days,  1 each am x 2 days and stop  Work on inhaler  technique:   Plan A = Automatic = Symbicort 80 Take 2 puffs first thing in am and then another 2 puffs about 12 hours later.  Plan B = Backup Only use your albuterol inhaler as a rescue medication Please schedule a follow up office visit in 2 weeks, sooner if needed  with all medications /inhalers/ solutions in hand so we can verify exactly what you are taking. This includes all medications from all doctors and over the counters     12/19/2018  f/u ov/Shivaun Bilello re: cough x 3 months / maint on symbicort 80 2bid / still smoking Chief Complaint  Patient presents with  . Follow-up    Breathing is doing some better and she is coughing less. She has not had to use her albuterol inhaler.   Dyspnea:  Can't do [employer] due to sob and  R knee pain  Cough: more day than noct and dry Sleeping: bed is flat/ 2 pillows  SABA : not using at present  02: no Using lots of mints  rec Stop smoking if at all possible Brush your tongue with [employer] toothpaste and garlge with the slurry Prednisone 10 mg take  4 each am x 2 days,   2 each am x 2 days,  1 each am x 2 days and stop to see if helps  As needed > tessalon 200 mg every 6 hours as needed  GERD diet Please remember to go to the lab department   for your tests - we will call you with the results when they are available.    Please schedule a follow up office visit in 6 weeks, call sooner if needed with PFTs on return       08/16/2019  f/u ov/Yazir Koerber re: still smoking /never better fro last eval  Chief Complaint  Patient presents with  . Follow-up    C10: asthmatic bronchitis pt unable to perform PFT due to cough and congestion today.Pt has been coughing clear mucus for the past 3 days. She had covid testin10/20 and was not feeling well.  . Nasal Congestion    pt states cough is making her feel nauseous. Pt has not have fever, chills or bodyaches. Pt says today she has sore throat.  Dyspnea:  Room to room  Cough: clear mucus  Sleeping: 45  degrees on pillows x years  SABA use: helps 02: rec Work on inhaler technique:  relax and gently blow all the way out then  take a nice smooth deep breath back in, triggering the inhaler at same time you start breathing in.  Hold for up to 5 seconds if you can. Blow symbicort out thru nose. Rinse and gargle with water when done Only use your albuterol as a rescue medication   Stop all mints  zpak  Prednisone 10 mg take  4 each am x 2 days,   2 each am x 2 days,  1 each am x 2 days and stop  Increase gabapentin to 100 mg 4 x daily -bfast lunch supper and bedtime  The key is to stop smoking completely before smoking completely stops you!  Please schedule a follow up office visit in 2 weeks, sooner if needed  with all medications /inhalers/ solutions in hand so we can verify exactly what you are taking. This includes all medications from all doctors and over the counters - needs cxr on return > did not do  - add prilosec 20 mg bid ac > did not do   09/02/2019  f/u ov/Altha Sweitzer re: uacs onset 08/2018 on entresto  - did not bring meds as req  Chief Complaint  Patient presents with  . Follow-up    Cough is slightly better. She never started prilosec.   Dyspnea:  Room to room Cough: daytime and dry  Sleeping: on side 2 pillows  SABA use: not helping  02: none    No obvious patterns in  day to day or daytime variability or assoc excess/ purulent sputum or mucus plugs or hemoptysis or cp or chest tightness, subjective wheeze or overt sinus or hb symptoms.   Actually sleeping ok  without nocturnal  or early am exacerbation  of respiratory  c/o's or need for noct saba. Also denies any obvious fluctuation of symptoms with weather or environmental changes or other aggravating or alleviating factors except as outlined above   No unusual exposure hx or h/o childhood pna/ asthma or knowledge of premature birth.  Current Allergies, Complete Past Medical History, Past Surgical History, Family History, and  Social History were reviewed in [employer] record.  ROS  The following are not active complaints unless bolded Hoarseness, sore throat, dysphagia, dental problems, itching, sneezing,  nasal congestion or discharge of excess mucus or purulent secretions, ear ache,   fever, chills, sweats, unintended wt loss or wt gain, classically pleuritic or exertional cp,  orthopnea pnd or arm/hand swelling  or leg swelling, presyncope, palpitations, abdominal pain, anorexia, nausea, vomiting, diarrhea  or change in bowel habits or change in bladder habits, change in stools or change in urine, dysuria, hematuria,  rash, arthralgias, visual complaints, headache, numbness, weakness or ataxia or problems with walking or coordination,  change in mood or  memory.        Current Meds  Medication Sig  . albuterol (PROVENTIL HFA;VENTOLIN HFA) 108 (90 Base) MCG/ACT inhaler Inhale 2 puffs into the lungs every 6 (six) hours as needed for wheezing or shortness of breath.  . allopurinol (ZYLOPRIM) 100 MG tablet Take 1 tablet (100 mg total) by mouth 2 (two) times daily.  Marland Kitchen atorvastatin (LIPITOR) 40 MG tablet Take 1 tablet (40 mg total) by mouth daily. Must keep upcoming office visit for refills.  . baclofen (LIORESAL) 10 MG tablet Take 20 mg by mouth at bedtime.  . Blood Glucose Monitoring Suppl (ACCU-CHEK AVIVA PLUS) w/Device KIT 1 each by Does not apply route 3 (three) times daily.  . budesonide-formoterol (SYMBICORT) 80-4.5 MCG/ACT inhaler Take 2 puffs  first thing in am and then another 2 puffs about 12 hours later.  . carvedilol (COREG) 3.125 MG tablet TAKE 1 TABLET BY MOUTH TWICE DAILY  . escitalopram (LEXAPRO) 20 MG tablet Take 1 tablet (20 mg total) by mouth daily.  . fluticasone (FLONASE) 50 MCG/ACT nasal spray Place 2 sprays into both nostrils daily. As needed for nasal vongestion  . furosemide (LASIX) 40 MG tablet Take 1 tablet (40 mg total) by mouth daily.  Marland Kitchen gabapentin (NEURONTIN) 100 MG  capsule One four times daily  . glimepiride (AMARYL) 2 MG tablet TAKE 1 TABLET BY MOUTH DAILY BEFORE BREAKFAST  . glucose blood test strip Use one strip TID  . ketoconazole (NIZORAL) 2 % cream Apply 1 application topically daily.  Marland Kitchen ketotifen (ALAWAY) 0.025 % ophthalmic solution Place 1 drop into both eyes 2 (two) times daily.  . Lancets (ACCU-CHEK SOFT TOUCH) lancets Use one new lancet TID  . NARCAN 4 MG/0.1ML LIQD nasal spray kit NAR REP ALN  . nystatin-triamcinolone ointment (MYCOLOG) Apply 1 application topically 2 (two) times daily.  Marland Kitchen oxyCODONE-acetaminophen (PERCOCET/ROXICET) 5-325 MG tablet TK 1 T PO TID PRN  . sacubitril-valsartan (ENTRESTO) 49-51 MG Take 1 tablet by mouth 2 (two) times daily.  Marland Kitchen spironolactone (ALDACTONE) 25 MG tablet TAKE 1 TABLET(25 MG) BY MOUTH DAILY              Objective:   Physical Exam  Obese bf with extremely harsh upper airway cough   09/02/2019        200  08/16/2019      192 12/19/2018        202  12/04/2018        205   11/20/18 201 lb 6.4 oz (91.4 kg)  10/30/18 197 lb (89.4 kg)  09/17/18 198 lb (89.8 kg)       Vital signs reviewed - Note on arrival 02 sats  100% on kRA       Reports edentulous  HEENT : pt wearing mask not removed for exam due to covid -19 concerns.    NECK :  without JVD/Nodes/TM/ nl carotid upstrokes bilaterally   LUNGS: no acc muscle use,  Nl contour chest which is clear to A and P bilaterally without cough on insp or exp maneuvers   CV:  RRR  no s3 or murmur or increase in P2, and no edema   ABD:  Quite obese soft and nontender with nl inspiratory excursion in the supine position. No bruits or organomegaly appreciated, bowel sounds nl  MS:  Nl gait/ ext warm without deformities, calf tenderness, cyanosis or clubbing No obvious joint restrictions   SKIN: warm and dry without lesions    NEURO:  alert, approp, nl sensorium with  no motor or cerebellar deficits apparent.        CXR PA and Lateral:    09/02/2019 :    I personally reviewed images and agree with radiology impression as follows:    Stable mild cardiomegaly. No active lung disease.             Assessment & Plan:

## 2019-09-03 ENCOUNTER — Other Ambulatory Visit: Payer: Self-pay

## 2019-09-03 ENCOUNTER — Ambulatory Visit (HOSPITAL_COMMUNITY)
Admission: RE | Admit: 2019-09-03 | Discharge: 2019-09-03 | Disposition: A | Discharge status: Home / Self Care | Payer: Medicare HMO | Source: Ambulatory Visit | Attending: Internal Medicine | Admitting: Internal Medicine

## 2019-09-03 ENCOUNTER — Encounter (HOSPITAL_COMMUNITY): Payer: Self-pay | Admitting: Internal Medicine

## 2019-09-03 ENCOUNTER — Encounter: Payer: Self-pay | Admitting: Internal Medicine

## 2019-09-03 VITALS — BP 102/72 | HR 87 | Wt 201.2 lb

## 2019-09-03 DIAGNOSIS — F1910 Other psychoactive substance abuse, uncomplicated: Secondary | ICD-10-CM | POA: Diagnosis not present

## 2019-09-03 DIAGNOSIS — F17210 Nicotine dependence, cigarettes, uncomplicated: Secondary | ICD-10-CM | POA: Insufficient documentation

## 2019-09-03 DIAGNOSIS — I5042 Chronic combined systolic (congestive) and diastolic (congestive) heart failure: Secondary | ICD-10-CM | POA: Diagnosis not present

## 2019-09-03 DIAGNOSIS — I5032 Chronic diastolic (congestive) heart failure: Secondary | ICD-10-CM | POA: Diagnosis not present

## 2019-09-03 DIAGNOSIS — I10 Essential (primary) hypertension: Secondary | ICD-10-CM

## 2019-09-03 DIAGNOSIS — I5022 Chronic systolic (congestive) heart failure: Secondary | ICD-10-CM | POA: Diagnosis not present

## 2019-09-03 DIAGNOSIS — Z7984 Long term (current) use of oral hypoglycemic drugs: Secondary | ICD-10-CM | POA: Diagnosis not present

## 2019-09-03 DIAGNOSIS — F17200 Nicotine dependence, unspecified, uncomplicated: Secondary | ICD-10-CM

## 2019-09-03 DIAGNOSIS — D649 Anemia, unspecified: Secondary | ICD-10-CM | POA: Diagnosis not present

## 2019-09-03 DIAGNOSIS — I110 Hypertensive heart disease with heart failure: Secondary | ICD-10-CM | POA: Diagnosis not present

## 2019-09-03 DIAGNOSIS — R05 Cough: Secondary | ICD-10-CM | POA: Diagnosis not present

## 2019-09-03 DIAGNOSIS — Z79899 Other long term (current) drug therapy: Secondary | ICD-10-CM | POA: Insufficient documentation

## 2019-09-03 DIAGNOSIS — E7800 Pure hypercholesterolemia, unspecified: Secondary | ICD-10-CM | POA: Insufficient documentation

## 2019-09-03 DIAGNOSIS — Z8249 Family history of ischemic heart disease and other diseases of the circulatory system: Secondary | ICD-10-CM | POA: Insufficient documentation

## 2019-09-03 DIAGNOSIS — I428 Other cardiomyopathies: Secondary | ICD-10-CM | POA: Insufficient documentation

## 2019-09-03 DIAGNOSIS — R0602 Shortness of breath: Secondary | ICD-10-CM | POA: Insufficient documentation

## 2019-09-03 LAB — BASIC METABOLIC PANEL
Anion gap: 10 (ref 5–15)
BUN: 22 mg/dL — ABNORMAL HIGH (ref 6–20)
CO2: 29 mmol/L (ref 22–32)
Calcium: 9.2 mg/dL (ref 8.9–10.3)
Chloride: 103 mmol/L (ref 98–111)
Creatinine, Ser: 1.18 mg/dL — ABNORMAL HIGH (ref 0.44–1.00)
GFR calc Af Amer: >60 mL/min (ref >60)
GFR calc non Af Amer: 52 mL/min — ABNORMAL LOW (ref >60)
Glucose, Bld: 174 mg/dL — ABNORMAL HIGH (ref 70–99)
Potassium: 4 mmol/L (ref 3.5–5.1)
Sodium: 142 mmol/L (ref 135–145)

## 2019-09-03 LAB — BRAIN NATRIURETIC PEPTIDE: B Natriuretic Peptide: 14.2 pg/mL (ref 0.0–100.0)

## 2019-09-03 MED ORDER — FUROSEMIDE 40 MG PO TABS: 40.0000 mg | ORAL_TABLET | Freq: Two times a day (BID) | ORAL | 3 refills | Status: DC

## 2019-09-03 NOTE — Progress Notes (Addendum)
Patient ID: Stacey Lin, female   DOB: [birth year], 53 y.o.   MRN: [id number]    Advanced Heart Failure Clinic Note   PCP: Dr Lily Kocher  Pulmonologist: Dr. Shelle Iron HF: Dr. Gala Romney   HPI: Stacey Lin is a 53 yo female with a history of anemia, combined systolic/diastolic heart failure, HTN, and history of polysubstance abuse.  Admitted to the hospital 08/19/13-08/29/13 with A/C systolic HF - EF 82-95%. Discharge weight 144 lbs. cATH 8/15. NORMAL CORS. EF 50-55%  Last echo 6/19 EF 55-60% mild to moderate AI, moderate TR, mid MR Personally reviewed  Today she returns for HF follow up. She has not been seen for over a year.   Says she feels OK. Remains SOB walking from room to room. Struggles with ADLs. + orthopnea/PND. Also struggling with a cough. Has seen Dr. Sherene Sires. CXR ok. He was suggesting she stop Entresto. Has gained about 10 pounds. Taking lasix. Haven't smoked x 1 week. Says no cocaine for at least 1 year. Says she has missed the [facility] for a few days and hasn't made a difference.    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 Mod MR and Mod-sev TR Echo (1/15): EF 45%, mild to moderate AI, moderate MR, RV normal size with mildly decreased systolic function.  CPX (5/15): FVC 66%, FEV1 59%, ratio 72%, peak VO2 15.7, slope 22.6 => mildly decreased functional capacity limited primarily by lungs.  Echo (05/2014): EF 55-60%, mild/mod AI, grade I DD and mild MR R/LHC (06/06/14): ectatic coronaries (suspect d/t HTN) no significant arthrosclerosis and normal EF. No significant MR noted Echo (7/16): EF 60-65%, moderate AI.  Echo (11/16): EF 55%, Mild LAE Echo 6/17: EF 20-25% severe central MR ECHO 03/2017: EF 6065% Grade IDD Mild MR  SH: Living in [place] with boyfriend. No ETOH or drug use. 2  cigs/day.   FH: Mother deceased: Dementia, seizures, stomach cancer        Father deceased: HTN  Review of systems complete and found to be negative unless listed in HPI.    Past Medical History:  Diagnosis Date  . Anemia   . Anginal pain (HCC)    2 months  . Arrhythmia   . Asthma   . CHF (congestive heart failure) (HCC)    1990s  . Chronic headaches   . COPD (chronic obstructive pulmonary disease) (HCC)    ??  . Hypercholesteremia   . Hypertension     Current Outpatient Medications  Medication Sig Dispense Refill  . albuterol (PROVENTIL HFA;VENTOLIN HFA) 108 (90 Base) MCG/ACT inhaler Inhale 2 puffs into the lungs every 6 (six) hours as needed for wheezing or shortness of breath. 1 Inhaler 5  . allopurinol (ZYLOPRIM) 100 MG tablet Take 1 tablet (100 mg total) by mouth 2 (two) times daily. 60 tablet 5  . atorvastatin (LIPITOR) 40 MG tablet Take 1 tablet (40 mg total) by mouth daily. Must keep upcoming office visit for refills. 90 tablet 0  . baclofen (LIORESAL) 10 MG tablet Take 20 mg by mouth at bedtime.    . Blood Glucose Monitoring Suppl (ACCU-CHEK AVIVA PLUS) w/Device KIT 1 each by Does not apply route 3 (three) times daily. 1 kit 0  . budesonide-formoterol (SYMBICORT) 80-4.5 MCG/ACT inhaler Take 2 puffs first thing in am and then another 2 puffs about  12 hours later. 1 Inhaler 0  . carvedilol (COREG) 3.125 MG tablet TAKE 1 TABLET BY MOUTH TWICE DAILY 60 tablet 5  . escitalopram (LEXAPRO) 20 MG tablet Take 1 tablet (20 mg total) by mouth daily. 30 tablet 5  . famotidine (PEPCID) 20 MG tablet One after supper 30 tablet 11  . fluticasone (FLONASE) 50 MCG/ACT nasal spray Place 2 sprays into both nostrils daily. As needed for nasal vongestion 16 g 6  . furosemide (LASIX) 40 MG tablet Take 1 tablet (40 mg total) by mouth daily. 90 tablet 3  . gabapentin (NEURONTIN) 300 MG capsule Take 1 capsule (300 mg total) by mouth 3 (three) times daily. 90 capsule 2  . glimepiride (AMARYL) 2 MG  tablet TAKE 1 TABLET BY MOUTH DAILY BEFORE BREAKFAST 30 tablet 5  . glucose blood test strip Use one strip TID 100 each 12  . ketoconazole (NIZORAL) 2 % cream Apply 1 application topically daily. 30 g 0  . ketotifen (ALAWAY) 0.025 % ophthalmic solution Place 1 drop into both eyes 2 (two) times daily. 5 mL 0  . Lancets (ACCU-CHEK SOFT TOUCH) lancets Use one new lancet TID 100 each 12  . NARCAN 4 MG/0.1ML LIQD nasal spray kit NAR REP ALN  0  . nystatin-triamcinolone ointment (MYCOLOG) Apply 1 application topically 2 (two) times daily. 30 g 0  . oxyCODONE-acetaminophen (PERCOCET/ROXICET) 5-325 MG tablet TK 1 T PO TID PRN  0  . pantoprazole (PROTONIX) 40 MG tablet Take 1 tablet (40 mg total) by mouth daily. Take 30-60 min before first meal of the day 30 tablet 2  . sacubitril-valsartan (ENTRESTO) 49-51 MG Take 1 tablet by mouth 2 (two) times daily. 180 tablet 0  . spironolactone (ALDACTONE) 25 MG tablet TAKE 1 TABLET(25 MG) BY MOUTH DAILY 30 tablet 5   No current facility-administered medications for this encounter.    Vitals:   09/03/19 1050  BP: 102/72  Pulse: 87  SpO2: 95%  Weight: 91.3 kg (201 lb 3.2 oz)   Wt Readings from Last 3 Encounters:  09/02/19 91 kg (200 lb 9.6 oz)  08/16/19 87.1 kg (192 lb)  12/25/18 91.5 kg (201 lb 12.8 oz)     PHYSICAL EXAM: General:  Well appearing. No resp difficulty HEENT: normal Neck: supple. JVP hard to see with thick neck Carotids 2+ bilat; no bruits. No lymphadenopathy or thryomegaly appreciated. Cor: PMI nondisplaced. Regular rate & rhythm. 2/6 SEM at RUSB s2 ok  Lungs: clear Abdomen: obese soft, nontender, nondistended. No hepatosplenomegaly. No bruits or masses. Good bowel sounds. Extremities: no cyanosis, clubbing, rash, edema Neuro: alert & orientedx3, cranial nerves grossly intact. moves all 4 extremities w/o difficulty. Affect pleasant  ECG NSR 92  occasional PVC No ST-T wave abnormalities.    ASSESSMENT & PLAN:  1) Chronic systolic   HF with now normal EF:  Nonischemic cardiomyopathy. Cath 8/15. Normal cors  - Echo 04/19/2017 EF 60 -65% range - Echo 6/19 EF 55-65% .  - Volume status hard to assess on exam but ReDS 44%. Will increase lasix to 40 bid. Have her f/u 2 weeks in [facility]. If still SOB. Consider RHC - Continue coreg 3.125 mg BID - Continue spiro 25 mg daily - Continue Entresto 49/51. Doubt this is cause of cough as she has stopped Entresto in past without benefit. Mor likely to be fluid    2) Valvular hear disease - Rheumatic -Follow with echos  3) HTN:  - BP on low end today.  4) Substance abuse - Reports abstinence from cocaine for > 1 year  5) Tobacco Abuse - has cut back. Discussed smoking cessation.    Arvilla Meres MD 10:45 AM

## 2019-09-03 NOTE — Assessment & Plan Note (Signed)
Onset Nov 2019 while on entresto - Allergy profile 12/19/2018 >  Eos 0.5/  IgE  8  RAST neg  - d/c all mints 12/19/2018  And rx tessilon  - 08/16/19  gabapentin 100 qid > slt better 09/02/2019  - 09/02/2019 rec change gabapentin to 300 tid and consider stopping entresto  The cough she has now is extremely harsh and classically upper airway and very few options left other than trial off entresto if persists with other option refer to Dr Joya Gaskins at [facility]

## 2019-09-03 NOTE — Patient Instructions (Signed)
Increase Furosemide (Lasix) to 40 mg Twice daily   Labs done today, we will notify you for abnormal results  Your physician recommends that you schedule a follow-up appointment in: 2 weeks  If you have any questions or concerns before your next appointment please send [place] a message through [place] or call our office at [phone].  At the [facility], you and your health needs are our priority. As part of our continuing mission to provide you with exceptional heart care, we have created designated Provider Care Teams. These Care Teams include your primary Cardiologist (physician) and Advanced Practice Providers (APPs- Physician Assistants and Nurse Practitioners) who all work together to provide you with the care you need, when you need it.   You may see any of the following providers on your designated Care Team at your next follow up: Marland Kitchen Dr Glori Bickers . Dr Loralie Champagne . Darrick Grinder, NP . Lyda Jester, PA   Please be sure to bring in all your medications bottles to every appointment.

## 2019-09-03 NOTE — Progress Notes (Signed)
ReDS Vest / Clip - 09/03/19 1100      ReDS Vest / Clip   Station Marker  A    Ruler Value  23.5    ReDS Value  (!) High volume overload   44

## 2019-09-03 NOTE — Assessment & Plan Note (Addendum)
09/02/2019   Walked RA x one lap =  approx 100- stopped due to sob with sats still 98% at slow pace   Most likely related to obesity/ conditioning ? Still component of chf though note absence now of orthopnea and clear cxr favor the latter is well controlled and would be ok on alternative to entresto if still coughing on max uacs rx       Next step is return for med reconciliation >  To keep things simple, I have asked the patient to first separate medicines that are perceived as maintenance, that is to be taken daily "no matter what", from those medicines that are taken on only on an as-needed basis and I have given the patient examples of both, and then return to see our NP to generate a  detailed  medication calendar which should be followed until the next physician sees the patient and updates it.      I had an extended discussion with the patient reviewing all relevant studies completed to date and  lasting 15 to 20 minutes of a 25 minute visit  which included directly observing ambulatory 02 saturation study documented in a/p section of  today's  office note.  Each maintenance medication was reviewed in detail including most importantly the difference between maintenance and prns and under what circumstances the prns are to be triggered using an action plan format that is not reflected in the computer generated alphabetically organized AVS.     Please see AVS for specific instructions unique to this visit that I personally wrote and verbalized to the the pt in detail and then reviewed with pt  by my nurse highlighting any changes in therapy recommended at today's visit .

## 2019-09-03 NOTE — Assessment & Plan Note (Addendum)
Quit smoking 11//2020  PFTs 10/2013:  FEV1 0.86 (41%), ratio 67, TLC 58%, unable to do DLCO Arlyce Harman as part of CPST 02/2014:  FEV1 1.19 (59%), ratio 72 Spiro 11/2014:  FEV1 1.08 (54%), ratio 80 - Spirometry 04/14/2016  FEV1 1.32 (68%)  Ratio 79 with nl effort indep portion of f/v loop  11/20/2018   resume symb 80 2bid x 2 week sample plus prednisone  X 6 days  then regroup with all meds in hand > did not do and as of 12/04/2018 no prednisone taken/still smoking  - Allergy profile 12/19/2018 >  Eos 0. /  IgE 8 rast neg  - 08/16/2019  After extensive coaching inhaler device,  effectiveness =    75%    Now that has quit smoking her lungs and cxr are clear/ reinforced maintain abstinence and symb 80 2bid  Return with all meds in hand using a trust but verify approach to confirm accurate Medication  Reconciliation The principal here is that until we are certain that the  patients are doing what we've asked, it makes no sense to ask them to do more.

## 2019-09-03 NOTE — Progress Notes (Signed)
Spoke with pt and notified of results per Dr. Wert. Pt verbalized understanding and denied any questions. 

## 2019-09-04 ENCOUNTER — Ambulatory Visit (INDEPENDENT_AMBULATORY_CARE_PROVIDER_SITE_OTHER): Payer: Medicare HMO | Admitting: Primary Care

## 2019-09-04 ENCOUNTER — Telehealth (INDEPENDENT_AMBULATORY_CARE_PROVIDER_SITE_OTHER): Payer: Self-pay

## 2019-09-04 ENCOUNTER — Encounter (INDEPENDENT_AMBULATORY_CARE_PROVIDER_SITE_OTHER): Payer: Self-pay | Admitting: Primary Care

## 2019-09-04 ENCOUNTER — Other Ambulatory Visit: Payer: Self-pay

## 2019-09-04 VITALS — BP 112/83 | HR 82 | Temp 97.1°F | Ht 60.0 in | Wt 200.4 lb

## 2019-09-04 DIAGNOSIS — L84 Corns and callosities: Secondary | ICD-10-CM

## 2019-09-04 DIAGNOSIS — Z23 Encounter for immunization: Secondary | ICD-10-CM | POA: Diagnosis not present

## 2019-09-04 DIAGNOSIS — H900 Conductive hearing loss, bilateral: Secondary | ICD-10-CM | POA: Diagnosis not present

## 2019-09-04 DIAGNOSIS — E119 Type 2 diabetes mellitus without complications: Secondary | ICD-10-CM | POA: Diagnosis not present

## 2019-09-04 LAB — POCT GLYCOSYLATED HEMOGLOBIN (HGB A1C): Hemoglobin A1C: 6.2 % — AB (ref 4.0–5.6)

## 2019-09-04 LAB — GLUCOSE, POCT (MANUAL RESULT ENTRY): POC Glucose: 90 mg/dl (ref 70–99)

## 2019-09-04 MED ORDER — CLOTRIMAZOLE-BETAMETHASONE 1-0.05 % EX CREA: 1.0000 "application " | TOPICAL_CREAM | Freq: Two times a day (BID) | CUTANEOUS | 0 refills | Status: DC

## 2019-09-04 MED ORDER — ESCITALOPRAM OXALATE 10 MG PO TABS: 10.0000 mg | ORAL_TABLET | Freq: Every day | ORAL | 3 refills | Status: DC

## 2019-09-04 MED ORDER — HYDROXYZINE HCL 10 MG PO TABS: 10.0000 mg | ORAL_TABLET | Freq: Three times a day (TID) | ORAL | 1 refills | Status: DC | PRN

## 2019-09-04 NOTE — Telephone Encounter (Signed)
FWD to PCP. Anselmo Reihl S Emilo Gras, CMA  

## 2019-09-04 NOTE — Patient Instructions (Signed)

## 2019-09-04 NOTE — Telephone Encounter (Signed)
Patient called to make a medication refill for Lexapro    Patient uses   [facility], [address]   Please advice [phone]

## 2019-09-04 NOTE — Progress Notes (Signed)
Rash under breast - ketoconazole breaking her out

## 2019-09-04 NOTE — Progress Notes (Signed)
Acute Office Visit  Subjective:    Patient ID: Stacey Lin, female    DOB: 10-20-1966, 53 y.o.   MRN: [id number]  Chief Complaint  Patient presents with  . Diabetes  . Depression/anxiety    HPI Patient is in today for acute visit she has a rash under bilateral breast.  Past Medical History:  Diagnosis Date  . Anemia   . Anginal pain (HCC)    2 months  . Arrhythmia   . Asthma   . CHF (congestive heart failure) (HCC)    1990s  . Chronic headaches   . COPD (chronic obstructive pulmonary disease) (HCC)    ??  . Hypercholesteremia   . Hypertension     Past Surgical History:  Procedure Laterality Date  . LEFT AND RIGHT HEART CATHETERIZATION WITH CORONARY ANGIOGRAM N/A 06/06/2014   Procedure: LEFT AND RIGHT HEART CATHETERIZATION WITH CORONARY ANGIOGRAM;  Surgeon: Dolores Patty, MD;  Location: [facility] CATH LAB;  Service: Cardiovascular;  Laterality: N/A;  . MULTIPLE EXTRACTIONS WITH ALVEOLOPLASTY Bilateral 07/24/2015   Procedure: MULTIPLE EXTRACTIONS WITH ALVEOLOPLASTY,  BILATERAL TORI ;  Surgeon: Ocie Doyne, DDS;  Location: [facility] OR;  Service: Oral Surgery;  Laterality: Bilateral;  . TUBAL LIGATION  1986    Family History  Problem Relation Age of Onset  . Hypertension Mother   . Allergies Mother   . Asthma Mother   . Heart disease Mother   . Stomach cancer Mother        Deceased, 29  . Seizures Mother   . Hypertension Father        Deceased  . Hypertension Maternal Grandmother   . Healthy Brother   . Healthy Son   . Healthy Daughter     Social History   Socioeconomic History  . Marital status: Legally Separated    Spouse name: Not on file  . Number of children: Not on file  . Years of education: Not on file  . Highest education level: Not on file  Occupational History  . Not on file  Social Needs  . Financial resource strain: Not on file  . Food insecurity    Worry: Not on file    Inability: Not on file  . Transportation needs    Medical: Not on  file    Non-medical: Not on file  Tobacco Use  . Smoking status: Former Smoker    Packs/day: 1.00    Years: 35.00    Pack years: 35.00    Types: Cigarettes    Quit date: 08/26/2019    Years since quitting: 0.0  . Smokeless tobacco: Never Used  Substance and Sexual Activity  . Alcohol use: No    Alcohol/week: 0.0 standard drinks  . Drug use: Not Currently    Frequency: 4.0 times per week    Types: "Crack" cocaine    Comment: RELAPSE 12/2016  . Sexual activity: Not on file  Lifestyle  . Physical activity    Days per week: Not on file    Minutes per session: Not on file  . Stress: Not on file  Relationships  . Social Musician on phone: Not on file    Gets together: Not on file    Attends religious service: Not on file    Active member of club or organization: Not on file    Attends meetings of clubs or organizations: Not on file    Relationship status: Not on file  . Intimate partner violence  Fear of current or ex partner: Not on file    Emotionally abused: Not on file    Physically abused: Not on file    Forced sexual activity: Not on file  Other Topics Concern  . Not on file  Social History Narrative   Lives with son in an apartment on the third floor.  Does not work.  On disability.  Previously worked in [employer].    Education: high school.      Outpatient Medications Prior to Visit  Medication Sig Dispense Refill  . albuterol (PROVENTIL HFA;VENTOLIN HFA) 108 (90 Base) MCG/ACT inhaler Inhale 2 puffs into the lungs every 6 (six) hours as needed for wheezing or shortness of breath. 1 Inhaler 5  . allopurinol (ZYLOPRIM) 100 MG tablet Take 1 tablet (100 mg total) by mouth 2 (two) times daily. 60 tablet 5  . atorvastatin (LIPITOR) 40 MG tablet Take 1 tablet (40 mg total) by mouth daily. Must keep upcoming office visit for refills. 90 tablet 0  . baclofen (LIORESAL) 10 MG tablet Take 20 mg by mouth at bedtime.    . Blood Glucose Monitoring Suppl (ACCU-CHEK  AVIVA PLUS) w/Device KIT 1 each by Does not apply route 3 (three) times daily. 1 kit 0  . budesonide-formoterol (SYMBICORT) 80-4.5 MCG/ACT inhaler Take 2 puffs first thing in am and then another 2 puffs about 12 hours later. 1 Inhaler 0  . carvedilol (COREG) 3.125 MG tablet TAKE 1 TABLET BY MOUTH TWICE DAILY 60 tablet 5  . famotidine (PEPCID) 20 MG tablet One after supper 30 tablet 11  . fluticasone (FLONASE) 50 MCG/ACT nasal spray Place 2 sprays into both nostrils daily. As needed for nasal vongestion 16 g 6  . furosemide (LASIX) 40 MG tablet Take 1 tablet (40 mg total) by mouth 2 (two) times daily. 60 tablet 3  . gabapentin (NEURONTIN) 300 MG capsule Take 1 capsule (300 mg total) by mouth 3 (three) times daily. 90 capsule 2  . glimepiride (AMARYL) 2 MG tablet TAKE 1 TABLET BY MOUTH DAILY BEFORE BREAKFAST 30 tablet 5  . glucose blood test strip Use one strip TID 100 each 12  . ketoconazole (NIZORAL) 2 % cream Apply 1 application topically daily. 30 g 0  . ketotifen (ALAWAY) 0.025 % ophthalmic solution Place 1 drop into both eyes 2 (two) times daily. 5 mL 0  . Lancets (ACCU-CHEK SOFT TOUCH) lancets Use one new lancet TID 100 each 12  . NARCAN 4 MG/0.1ML LIQD nasal spray kit NAR REP ALN  0  . nystatin-triamcinolone ointment (MYCOLOG) Apply 1 application topically 2 (two) times daily. 30 g 0  . oxyCODONE-acetaminophen (PERCOCET/ROXICET) 5-325 MG tablet TK 1 T PO TID PRN  0  . pantoprazole (PROTONIX) 40 MG tablet Take 1 tablet (40 mg total) by mouth daily. Take 30-60 min before first meal of the day 30 tablet 2  . sacubitril-valsartan (ENTRESTO) 49-51 MG Take 1 tablet by mouth 2 (two) times daily. 180 tablet 0  . spironolactone (ALDACTONE) 25 MG tablet TAKE 1 TABLET(25 MG) BY MOUTH DAILY 30 tablet 5   No facility-administered medications prior to visit.     Allergies  Allergen Reactions  . Ketoconazole Rash    Review of Systems  HENT: Positive for hearing loss.   Skin: Positive for itching  and rash.  All other systems reviewed and are negative.      Objective:    Physical Exam  Constitutional: She is oriented to person, place, and time. She appears  well-developed and well-nourished.  HENT:  Head: Normocephalic.  Neck: Neck supple.  Cardiovascular: Normal rate and regular rhythm.  Pulmonary/Chest: Effort normal.  Abdominal: Soft. Bowel sounds are normal. She exhibits distension.  Musculoskeletal: Normal range of motion.  Neurological: She is oriented to person, place, and time.  Skin: Rash noted. There is erythema.  Psychiatric: She has a normal mood and affect.    BP 112/83 (BP Location: Left Arm, Patient Position: Sitting, Cuff Size: Normal)   Pulse 82   Temp (!) 97.1 F (36.2 C) (Temporal)   Ht 5' (1.524 m)   Wt 200 lb 6.4 oz (90.9 kg)   LMP  (LMP Unknown)   SpO2 100%   BMI 39.14 kg/m  Wt Readings from Last 3 Encounters:  09/04/19 200 lb 6.4 oz (90.9 kg)  09/03/19 201 lb 3.2 oz (91.3 kg)  09/02/19 200 lb 9.6 oz (91 kg)    Health Maintenance Due  Topic Date Due  . FOOT EXAM  04/09/1974  . Fecal DNA (Cologuard)  04/09/2014  . HEMOGLOBIN A1C  04/30/2019  . INFLUENZA VACCINE  05/25/2019  . URINE MICROALBUMIN  08/07/2019    There are no preventive care reminders to display for this patient.   Lab Results  Component Value Date   TSH 2.240 11/27/2018   Lab Results  Component Value Date   WBC 11.6 (H) 12/19/2018   HGB 12.0 12/19/2018   HCT 36.3 12/19/2018   MCV 91.1 12/19/2018   PLT 321.0 12/19/2018   Lab Results  Component Value Date   NA 142 09/03/2019   K 4.0 09/03/2019   CO2 29 09/03/2019   GLUCOSE 174 (H) 09/03/2019   BUN 22 (H) 09/03/2019   CREATININE 1.18 (H) 09/03/2019   BILITOT 0.3 09/17/2018   ALKPHOS 142 (H) 09/19/2018   AST 20 09/17/2018   ALT 24 09/17/2018   PROT 7.3 09/17/2018   ALBUMIN 4.6 09/17/2018   CALCIUM 9.2 09/03/2019   ANIONGAP 10 09/03/2019   Lab Results  Component Value Date   CHOL 218 (H) 01/09/2018    Lab Results  Component Value Date   HDL 41 01/09/2018   Lab Results  Component Value Date   LDLCALC 152 (H) 01/09/2018   Lab Results  Component Value Date   TRIG 123 01/09/2018   Lab Results  Component Value Date   CHOLHDL 5.3 (H) 01/09/2018   Lab Results  Component Value Date   HGBA1C 6.6 (A) 10/30/2018       Assessment & Plan:  Jillaine was seen today for diabetes and depression/anxiety.  Diagnoses and all orders for this visit:  Type 2 diabetes mellitus without complication, without long-term current use of insulin (HCC) -     HgB A1c 6.2 previously 10 months prior A1C 6.6 improving with no medication . Diet modification and exercising. -     Glucose (CBG)  Callus of foot -     Ambulatory referral to Podiatry  Conductive hearing loss, bilateral Conducted a  [place] test. -     Ambulatory referral to ENT  Need for immunization against influenza -     Flu Vaccine QUAD 36+ mos IM  Other orders -     hydrOXYzine (ATARAX/VISTARIL) 10 MG tablet; Take 1 tablet (10 mg total) by mouth every 8 (eight) hours as needed for itching. -     clotrimazole-betamethasone (LOTRISONE) cream; Apply 1 application topically 2 (two) times daily. -     escitalopram (LEXAPRO) 10 MG tablet; Take 1 tablet (  10 mg total) by mouth daily.     No orders of the defined types were placed in this encounter.    Grayce Sessions, NP

## 2019-09-04 NOTE — Telephone Encounter (Signed)
Sent in Lexapro 10mg  daily follow 6-8 weeks Please make him a appointment

## 2019-09-09 ENCOUNTER — Ambulatory Visit: Payer: Medicaid Other | Admitting: Podiatry

## 2019-09-11 ENCOUNTER — Other Ambulatory Visit: Payer: Self-pay

## 2019-09-11 ENCOUNTER — Encounter: Payer: Self-pay | Admitting: Podiatry

## 2019-09-11 ENCOUNTER — Ambulatory Visit (INDEPENDENT_AMBULATORY_CARE_PROVIDER_SITE_OTHER): Payer: Medicaid Other | Admitting: Podiatry

## 2019-09-11 DIAGNOSIS — L853 Xerosis cutis: Secondary | ICD-10-CM

## 2019-09-11 DIAGNOSIS — E119 Type 2 diabetes mellitus without complications: Secondary | ICD-10-CM

## 2019-09-11 DIAGNOSIS — B351 Tinea unguium: Secondary | ICD-10-CM | POA: Diagnosis not present

## 2019-09-11 DIAGNOSIS — M79674 Pain in right toe(s): Secondary | ICD-10-CM

## 2019-09-11 MED ORDER — AMMONIUM LACTATE 12 % EX LOTN: 1.0000 "application " | TOPICAL_LOTION | Freq: Two times a day (BID) | CUTANEOUS | 4 refills | Status: DC

## 2019-09-11 NOTE — Patient Instructions (Addendum)
Corns and Calluses Corns are small areas of thickened skin that occur on the top, sides, or tip of a toe. They contain a cone-shaped core with a point that can press on a nerve below. This causes pain.  Calluses are areas of thickened skin that can occur anywhere on the body, including the hands, fingers, palms, soles of the feet, and heels. Calluses are usually larger than corns. What are the causes? Corns and calluses are caused by rubbing (friction) or pressure, such as from shoes that are too tight or do not fit properly. What increases the risk? Corns are more likely to develop in people who have misshapen toes (toe deformities), such as hammer toes. Calluses can occur with friction to any area of the skin. They are more likely to develop in people who:  Work with their hands.  Wear shoes that fit poorly, are too tight, or are high-heeled.  Have toe deformities. What are the signs or symptoms? Symptoms of a corn or callus include:  A hard growth on the skin.  Pain or tenderness under the skin.  Redness and swelling.  Increased discomfort while wearing tight-fitting shoes, if your feet are affected. If a corn or callus becomes infected, symptoms may include:  Redness and swelling that gets worse.  Pain.  Fluid, blood, or pus draining from the corn or callus. How is this diagnosed? Corns and calluses may be diagnosed based on your symptoms, your medical history, and a physical exam. How is this treated? Treatment for corns and calluses may include:  Removing the cause of the friction or pressure. This may involve: ? Changing your shoes. ? Wearing shoe inserts (orthotics) or other protective layers in your shoes, such as a corn pad. ? Wearing gloves.  Applying medicine to the skin (topical medicine) to help soften skin in the hardened, thickened areas.  Removing layers of dead skin with a file to reduce the size of the corn or callus.  Removing the corn or callus with a  scalpel or laser.  Taking antibiotic medicines, if your corn or callus is infected.  Having surgery, if a toe deformity is the cause. Follow these instructions at home:   Take over-the-counter and prescription medicines only as told by your health care provider.  If you were prescribed an antibiotic, take it as told by your health care provider. Do not stop taking it even if your condition starts to improve.  Wear shoes that fit well. Avoid wearing high-heeled shoes and shoes that are too tight or too loose.  Wear any padding, protective layers, gloves, or orthotics as told by your health care provider.  Soak your hands or feet and then use a file or pumice stone to soften your corn or callus. Do this as told by your health care provider.  Check your corn or callus every day for symptoms of infection. Contact a health care provider if you:  Notice that your symptoms do not improve with treatment.  Have redness or swelling that gets worse.  Notice that your corn or callus becomes painful.  Have fluid, blood, or pus coming from your corn or callus.  Have new symptoms. Summary  Corns are small areas of thickened skin that occur on the top, sides, or tip of a toe.  Calluses are areas of thickened skin that can occur anywhere on the body, including the hands, fingers, palms, and soles of the feet. Calluses are usually larger than corns.  Corns and calluses are caused by   rubbing (friction) or pressure, such as from shoes that are too tight or do not fit properly.  Treatment may include wearing any padding, protective layers, gloves, or orthotics as told by your health care provider. This information is not intended to replace advice given to you by your health care provider. Make sure you discuss any questions you have with your health care provider. Document Released: 07/16/2004 Document Revised: 01/30/2019 Document Reviewed: 08/23/2017 Elsevier Patient Education  2020 Elsevier  Inc[employer]  Diabetes Mellitus and Foot Care Foot care is an important part of your health, especially when you have diabetes. Diabetes may cause you to have problems because of poor blood flow (circulation) to your feet and legs, which can cause your skin to:  Become thinner and drier.  Break more easily.  Heal more slowly.  Peel and crack. You may also have nerve damage (neuropathy) in your legs and feet, causing decreased feeling in them. This means that you may not notice minor injuries to your feet that could lead to more serious problems. Noticing and addressing any potential problems early is the best way to prevent future foot problems. How to care for your feet Foot hygiene  Wash your feet daily with warm water and mild soap. Do not use hot water. Then, pat your feet and the areas between your toes until they are completely dry. Do not soak your feet as this can dry your skin.  Trim your toenails straight across. Do not dig under them or around the cuticle. File the edges of your nails with an emery board or nail file.  Apply a moisturizing lotion or petroleum jelly to the skin on your feet and to dry, brittle toenails. Use lotion that does not contain alcohol and is unscented. Do not apply lotion between your toes. Shoes and socks  Wear clean socks or stockings every day. Make sure they are not too tight. Do not wear knee-high stockings since they may decrease blood flow to your legs.  Wear shoes that fit properly and have enough cushioning. Always look in your shoes before you put them on to be sure there are no objects inside.  To break in new shoes, wear them for just a few hours a day. This prevents injuries on your feet. Wounds, scrapes, corns, and calluses  Check your feet daily for blisters, cuts, bruises, sores, and redness. If you cannot see the bottom of your feet, use a mirror or ask someone for help.  Do not cut corns or calluses or try to remove them with medicine.   If you find a minor scrape, cut, or break in the skin on your feet, keep it and the skin around it clean and dry. You may clean these areas with mild soap and water. Do not clean the area with peroxide, alcohol, or iodine.  If you have a wound, scrape, corn, or callus on your foot, look at it several times a day to make sure it is healing and not infected. Check for: ? Redness, swelling, or pain. ? Fluid or blood. ? Warmth. ? Pus or a bad smell. General instructions  Do not cross your legs. This may decrease blood flow to your feet.  Do not use heating pads or hot water bottles on your feet. They may burn your skin. If you have lost feeling in your feet or legs, you may not know this is happening until it is too late.  Protect your feet from hot and cold by wearing shoes,   such as at the beach or on hot pavement.  Schedule a complete foot exam at least once a year (annually) or more often if you have foot problems. If you have foot problems, report any cuts, sores, or bruises to your health care provider immediately. Contact a health care provider if:  You have a medical condition that increases your risk of infection and you have any cuts, sores, or bruises on your feet.  You have an injury that is not healing.  You have redness on your legs or feet.  You feel burning or tingling in your legs or feet.  You have pain or cramps in your legs and feet.  Your legs or feet are numb.  Your feet always feel cold.  You have pain around a toenail. Get help right away if:  You have a wound, scrape, corn, or callus on your foot and: ? You have pain, swelling, or redness that gets worse. ? You have fluid or blood coming from the wound, scrape, corn, or callus. ? Your wound, scrape, corn, or callus feels warm to the touch. ? You have pus or a bad smell coming from the wound, scrape, corn, or callus. ? You have a fever. ? You have a red line going up your leg. Summary  Check your feet  every day for cuts, sores, red spots, swelling, and blisters.  Moisturize feet and legs daily.  Wear shoes that fit properly and have enough cushioning.  If you have foot problems, report any cuts, sores, or bruises to your health care provider immediately.  Schedule a complete foot exam at least once a year (annually) or more often if you have foot problems. This information is not intended to replace advice given to you by your health care provider. Make sure you discuss any questions you have with your health care provider. Document Released: 10/07/2000 Document Revised: 11/22/2017 Document Reviewed: 11/11/2016 Elsevier Patient Education  2020 [employer] Inc.  

## 2019-09-16 ENCOUNTER — Encounter: Payer: Self-pay | Admitting: Adult Health

## 2019-09-16 ENCOUNTER — Other Ambulatory Visit: Payer: Self-pay

## 2019-09-16 ENCOUNTER — Ambulatory Visit (INDEPENDENT_AMBULATORY_CARE_PROVIDER_SITE_OTHER): Payer: Medicare HMO | Admitting: Adult Health

## 2019-09-16 DIAGNOSIS — J4530 Mild persistent asthma, uncomplicated: Secondary | ICD-10-CM | POA: Diagnosis not present

## 2019-09-16 DIAGNOSIS — F17210 Nicotine dependence, cigarettes, uncomplicated: Secondary | ICD-10-CM | POA: Diagnosis not present

## 2019-09-16 DIAGNOSIS — R058 Other specified cough: Secondary | ICD-10-CM

## 2019-09-16 DIAGNOSIS — R05 Cough: Secondary | ICD-10-CM | POA: Diagnosis not present

## 2019-09-16 DIAGNOSIS — I5022 Chronic systolic (congestive) heart failure: Secondary | ICD-10-CM

## 2019-09-16 NOTE — Progress Notes (Signed)
@Patient  ID: Stacey Lin, female    DOB: 25-Dec-1965, 53 y.o.   MRN: [id number]  Chief Complaint  Patient presents with  . Follow-up    Asthma     Referring provider: Grayce Sessions, NP  HPI: 53 year old female smoker followed for asthmatic bronchitis/COPD   TEST/EVENTS :   PFTs 10/2013: FEV1 0.86 (41%), ratio 67, TLC 58%, unable to do DLCO Cleda Daub as part of CPST 02/2014: FEV1 1.19 (59%), ratio 72 Spiro 11/2014: FEV1 1.08 (54%), ratio 80 -   - Allergy profile 12/19/2018 >  Eos 0. /  IgE 8 rast neg   09/16/2019 Follow up : Asthmatic Bronchitis /COPD Patient returns for a 2-week follow-up.. Patient has asthmatic bronchitis she is prone to chronic coughing.  She says overall she is doing okay.  She says her cough is at baseline.  Seems to be doing a little bit better than usual.  Says she gets short of breath with minimum activity. She remains on Symbicort twice daily.  Denies any increased albuterol use  We reviewed all her medications organize them into a medication calendar with patient education. Each medicine was listed and the reason for taking.. She does have duplicate medications in bottles which she is unclear if she is taking both of them.  We consolidated her medication bottles. She was not clear on what each medicine was for.  Patient does continue to smoke.  Smoking cessation was discussed.   She has combined systolic and diastolic heart failure.  Is followed by cardiology.  Says she has had some increased lower extremity swelling.  Her Lasix has been increased to 40 twice daily.  Says that this has been helping.  Allergies  Allergen Reactions  . Ketoconazole Rash    Immunization History  Administered Date(s) Administered  . Influenza,inj,Quad PF,6+ Mos 08/20/2013, 07/12/2018, 09/04/2019  . Influenza-Unspecified 07/24/2014  . Pneumococcal Polysaccharide-23 08/20/2013, 04/05/2016  . Tdap 02/22/2018    Past Medical History:  Diagnosis Date  . Anemia   .  Anginal pain (HCC)    2 months  . Arrhythmia   . Asthma   . CHF (congestive heart failure) (HCC)    1990s  . Chronic headaches   . COPD (chronic obstructive pulmonary disease) (HCC)    ??  . Hypercholesteremia   . Hypertension     Tobacco History: Social History   Tobacco Use  Smoking Status Former Smoker  . Packs/day: 1.00  . Years: 35.00  . Pack years: 35.00  . Types: Cigarettes  . Quit date: 08/26/2019  . Years since quitting: 0.0  Smokeless Tobacco Never Used   Counseling given: Not Answered   Outpatient Medications Prior to Visit  Medication Sig Dispense Refill  . albuterol (PROVENTIL HFA;VENTOLIN HFA) 108 (90 Base) MCG/ACT inhaler Inhale 2 puffs into the lungs every 6 (six) hours as needed for wheezing or shortness of breath. 1 Inhaler 5  . allopurinol (ZYLOPRIM) 100 MG tablet Take 1 tablet (100 mg total) by mouth 2 (two) times daily. 60 tablet 5  . ammonium lactate (AMLACTIN) 12 % lotion Apply 1 application topically 2 (two) times daily. 400 g 4  . atorvastatin (LIPITOR) 40 MG tablet Take 1 tablet (40 mg total) by mouth daily. Must keep upcoming office visit for refills. 90 tablet 0  . baclofen (LIORESAL) 10 MG tablet Take 20 mg by mouth at bedtime.    . Blood Glucose Monitoring Suppl (ACCU-CHEK AVIVA PLUS) w/Device KIT 1 each by Does not apply route 3 (three) times  daily. 1 kit 0  . budesonide-formoterol (SYMBICORT) 80-4.5 MCG/ACT inhaler Take 2 puffs first thing in am and then another 2 puffs about 12 hours later. 1 Inhaler 0  . clotrimazole-betamethasone (LOTRISONE) cream Apply 1 application topically 2 (two) times daily. 30 g 0  . escitalopram (LEXAPRO) 10 MG tablet Take 1 tablet (10 mg total) by mouth daily. 30 tablet 3  . famotidine (PEPCID) 20 MG tablet One after supper 30 tablet 11  . fluticasone (FLONASE) 50 MCG/ACT nasal spray Place 2 sprays into both nostrils daily. As needed for nasal vongestion 16 g 6  . furosemide (LASIX) 40 MG tablet Take 1 tablet (40 mg  total) by mouth 2 (two) times daily. 60 tablet 3  . gabapentin (NEURONTIN) 300 MG capsule Take 1 capsule (300 mg total) by mouth 3 (three) times daily. 90 capsule 2  . glimepiride (AMARYL) 2 MG tablet TAKE 1 TABLET BY MOUTH DAILY BEFORE BREAKFAST 30 tablet 5  . glucose blood test strip Use one strip TID 100 each 12  . hydrOXYzine (ATARAX/VISTARIL) 10 MG tablet Take 1 tablet (10 mg total) by mouth every 8 (eight) hours as needed for itching. 60 tablet 1  . ketoconazole (NIZORAL) 2 % cream Apply 1 application topically daily. 30 g 0  . ketotifen (ALAWAY) 0.025 % ophthalmic solution Place 1 drop into both eyes 2 (two) times daily. 5 mL 0  . Lancets (ACCU-CHEK SOFT TOUCH) lancets Use one new lancet TID 100 each 12  . NARCAN 4 MG/0.1ML LIQD nasal spray kit NAR REP ALN  0  . nystatin-triamcinolone ointment (MYCOLOG) Apply 1 application topically 2 (two) times daily. 30 g 0  . oxyCODONE-acetaminophen (PERCOCET/ROXICET) 5-325 MG tablet TK 1 T PO TID PRN  0  . pantoprazole (PROTONIX) 40 MG tablet Take 1 tablet (40 mg total) by mouth daily. Take 30-60 min before first meal of the day 30 tablet 2  . sacubitril-valsartan (ENTRESTO) 49-51 MG Take 1 tablet by mouth 2 (two) times daily. 180 tablet 0  . spironolactone (ALDACTONE) 25 MG tablet TAKE 1 TABLET(25 MG) BY MOUTH DAILY 30 tablet 5  . carvedilol (COREG) 3.125 MG tablet TAKE 1 TABLET BY MOUTH TWICE DAILY 60 tablet 5   No facility-administered medications prior to visit.      Review of Systems:   Constitutional:   No  weight loss, night sweats,  Fevers, chills, + fatigue, or  lassitude.  HEENT:   No headaches,  Difficulty swallowing,  Tooth/dental problems, or  Sore throat,                No sneezing, itching, ear ache,  +nasal congestion, post nasal drip,   CV:  No chest pain,  Orthopnea, PND, swelling in lower extremities, anasarca, dizziness, palpitations, syncope.   GI  No heartburn, indigestion, abdominal pain, nausea, vomiting, diarrhea,  change in bowel habits, loss of appetite, bloody stools.   Resp:   No chest wall deformity  Skin: no rash or lesions.  GU: no dysuria, change in color of urine, no urgency or frequency.  No flank pain, no hematuria   MS:  No joint pain or swelling.  No decreased range of motion.  No back pain.    Physical Exam  BP 110/78 (BP Location: Left Arm, Patient Position: Sitting, Cuff Size: Normal)   Pulse 92   Temp (!) 97.4 F (36.3 C) (Temporal)   Ht 5' (1.524 m)   Wt 201 lb 3.2 oz (91.3 kg)   LMP  (LMP  Unknown)   SpO2 100% Comment: RA  BMI 39.29 kg/m   GEN: A/Ox3; pleasant , NAD, obese per BMI    HEENT:  [place]/AT,   NOSE-clear, THROAT-clear, no lesions, no postnasal drip or exudate noted.   NECK:  Supple w/ fair ROM; no JVD; normal carotid impulses w/o bruits; no thyromegaly or nodules palpated; no lymphadenopathy.    RESP  Clear  P & A; w/o, wheezes/ rales/ or rhonchi. no accessory muscle use, no dullness to percussion  CARD:  RRR, no m/r/g, tr  peripheral edema, pulses intact, no cyanosis or clubbing.  GI:   Soft & nt; nml bowel sounds; no organomegaly or masses detected.   Musco: Warm bil, no deformities or joint swelling noted.   Neuro: alert, no focal deficits noted.    Skin: Warm, no lesions or rashes    Lab Results:  CBC   Imaging: Dg Chest 2 View  Result Date: 09/02/2019 CLINICAL DATA:  Cough. COPD. Asthmatic bronchitis. EXAM: CHEST - 2 VIEW COMPARISON:  11/12/2018 FINDINGS: Mild cardiomegaly is stable. Aortic atherosclerosis. Both lungs are clear. IMPRESSION: Stable mild cardiomegaly. No active lung disease. Electronically Signed   By: Danae Orleans M.D.   On: 09/02/2019 15:47      PFT Results Latest Ref Rng & Units 08/16/2019 11/19/2013  FVC-Pre L 1.31 1.29  FVC-Predicted Pre % 53 49  FVC-Post L - 1.09  FVC-Predicted Post % - 42  Pre FEV1/FVC % % 64 67  Post FEV1/FCV % % - 54  FEV1-Pre L 0.84 0.86  FEV1-Predicted Pre % 43 41  FEV1-Post L - 0.59  TLC  L - 2.69  TLC % Predicted % - 58  RV % Predicted % - 77    No results found for: NITRICOXIDE      Assessment & Plan:   Asthmatic bronchitis Asthmatic bronchitis and active smoker.  Patient is encouraged on trigger prevention and smoking cessation.  Patient's medications were reviewed today and patient education was given. Computerized medication calendar was adjusted/completed   Plan  Patient Instructions  Continue on current regimen .  Follow med calendar closely and bring to each visit .  Work on not smoking .  Follow up with Dr. Sherene Sires  In 3 months and As needed        Chronic systolic HF (heart failure) (HCC) Continue to follow-up with cardiology.  Cigarette smoker Smoking cessation encouraged  Upper airway cough syndrome Continue on current regimen with cough control, GERD prevention and gabapentin.  Plan  Patient Instructions  Continue on current regimen .  Follow med calendar closely and bring to each visit .  Work on not smoking .  Follow up with Dr. Sherene Sires  In 3 months and As needed           Rubye Oaks, NP 09/16/2019

## 2019-09-16 NOTE — Assessment & Plan Note (Signed)
Asthmatic bronchitis and active smoker.  Patient is encouraged on trigger prevention and smoking cessation.  Patient's medications were reviewed today and patient education was given. Computerized medication calendar was adjusted/completed   Plan  Patient Instructions  Continue on current regimen .  Follow med calendar closely and bring to each visit .  Work on not smoking .  Follow up with Dr. Melvyn Novas  In 3 months and As needed

## 2019-09-16 NOTE — Assessment & Plan Note (Signed)
Continue on current regimen with cough control, GERD prevention and gabapentin.  Plan  Patient Instructions  Continue on current regimen .  Follow med calendar closely and bring to each visit .  Work on not smoking .  Follow up with Dr. Melvyn Novas  In 3 months and As needed

## 2019-09-16 NOTE — Assessment & Plan Note (Signed)
Smoking cessation encouraged!

## 2019-09-16 NOTE — Assessment & Plan Note (Signed)
Continue to follow up with cardiology.

## 2019-09-16 NOTE — Patient Instructions (Signed)
Continue on current regimen .  Follow med calendar closely and bring to each visit .  Work on not smoking .  Follow up with Dr. Melvyn Novas  In 3 months and As needed

## 2019-09-17 ENCOUNTER — Encounter (HOSPITAL_COMMUNITY): Payer: Medicare HMO

## 2019-09-17 NOTE — Progress Notes (Signed)
Chart and office note reviewed in detail  > agree with a/p as outlined    

## 2019-09-18 NOTE — Progress Notes (Signed)
Subjective:  Stacey Lin presents to clinic today with cc of  painful, thick, discolored, elongated toenails  of both feet that become tender and patient cannot cut because of thickness. Pain is aggravated when wearing enclosed shoe gear and relieved with periodic professional debridement.  Patient voices no new pedal concerns on today's visit.  Medications reviewed in chart.  Allergies  Allergen Reactions  . Ketoconazole Rash     Objective:  Physical Examination:  Vascular Examination: Capillary refill time immediate b/l.  Palpable DP/PT pulses b/l.  Digital hair absent b/l.   No edema noted b/l.  Skin temperature gradient WNL b/l.  Dermatological Examination: Skin with normal turgor, texture and tone b/l.  No open wounds b/l.  No interdigital macerations noted b/l.  Elongated, thick, discolored brittle toenails with subungual debris and pain on dorsal palpation of nailbeds 1-5 b/l.  Hyperkeratotic lesion right hallux, submet head 1 right and left heel with tenderness to palpation. No edema, no erythema, no drainage, no flocculence.  Moderately dry, flaky skin noted b/l.  Musculoskeletal Examination: Muscle strength 5/5 to all muscle groups b/l.  No pain, crepitus or joint discomfort with active/passive ROM.  Neurological Examination: Sensation intact 5/5 b/l with 10 gram monofilament.  Assessment: Mycotic nail infection with pain 1-5 b/l Calluses right hallux, left heel and submet head 1 right foot Xerosis cutis b/l NIDDM  Plan: 1. Toenails 1-5 b/l were debrided in length and girth without iatrogenic laceration.  2. As courtesy, calluses pared submetatarsal head 1 right, left heel and right hallux utilizing sterile scalpel blade without incident.  3. For xerosis, prescription for AmLactin lotion 12% was written.  Patient is to apply to both feet twice a day avoiding application in between the toes. 4. Continue soft, supportive shoe gear  daily. 5. Report any pedal injuries to medical professional. 6. Follow up 3 months. 7. Patient/POA to call should there be a question/concern in there interim.

## 2019-10-07 ENCOUNTER — Encounter (INDEPENDENT_AMBULATORY_CARE_PROVIDER_SITE_OTHER): Payer: Self-pay | Admitting: Primary Care

## 2019-10-07 ENCOUNTER — Other Ambulatory Visit: Payer: Self-pay

## 2019-10-07 ENCOUNTER — Ambulatory Visit (INDEPENDENT_AMBULATORY_CARE_PROVIDER_SITE_OTHER): Payer: Medicare HMO | Admitting: Primary Care

## 2019-10-07 ENCOUNTER — Ambulatory Visit: Payer: Medicare HMO

## 2019-10-07 DIAGNOSIS — E119 Type 2 diabetes mellitus without complications: Secondary | ICD-10-CM | POA: Diagnosis not present

## 2019-10-07 DIAGNOSIS — R55 Syncope and collapse: Secondary | ICD-10-CM | POA: Diagnosis not present

## 2019-10-07 NOTE — Progress Notes (Signed)
Virtual Visit via Telephone Note  I connected with Stacey Lin on 10/07/19 at 11:10 AM EST by telephone and verified that I am speaking with the correct person using two identifiers.   I discussed the limitations, risks, security and privacy concerns of performing an evaluation and management service by telephone and the availability of in person appointments. I also discussed with the patient that there may be a patient responsible charge related to this service. The patient expressed understanding and agreed to proceed.   History of Present Illness: Stacey Lin is having a visit for syncope at Thanksgiving she feels she was over exhausted after she laid down and woke up she felt great. Daughters called EMS and wanted her to follow up PCP. She assures me that she was preparing started cooking 6 oclock that morning she just did a little too much  Past Medical History:  Diagnosis Date  . Anemia   . Anginal pain ([place])    2 months  . Arrhythmia   . Asthma   . CHF (congestive heart failure) ([place])    1990s  . Chronic headaches   . COPD (chronic obstructive pulmonary disease) ([place])    ??  . Hypercholesteremia   . Hypertension    Current Outpatient Medications on File Prior to Visit  Medication Sig Dispense Refill  . albuterol (PROVENTIL HFA;VENTOLIN HFA) 108 (90 Base) MCG/ACT inhaler Inhale 2 puffs into the lungs every 6 (six) hours as needed for wheezing or shortness of breath. 1 Inhaler 5  . allopurinol (ZYLOPRIM) 100 MG tablet Take 1 tablet (100 mg total) by mouth 2 (two) times daily. 60 tablet 5  . ammonium lactate (AMLACTIN) 12 % lotion Apply 1 application topically 2 (two) times daily. 400 g 4  . atorvastatin (LIPITOR) 40 MG tablet Take 1 tablet (40 mg total) by mouth daily. Must keep upcoming office visit for refills. 90 tablet 0  . baclofen (LIORESAL) 10 MG tablet Take 20 mg by mouth at bedtime.    . Blood Glucose Monitoring Suppl (ACCU-CHEK AVIVA PLUS) w/Device KIT 1 each  by Does not apply route 3 (three) times daily. 1 kit 0  . budesonide-formoterol (SYMBICORT) 80-4.5 MCG/ACT inhaler Take 2 puffs first thing in am and then another 2 puffs about 12 hours later. 1 Inhaler 0  . carvedilol (COREG) 3.125 MG tablet TAKE 1 TABLET BY MOUTH TWICE DAILY 60 tablet 5  . clotrimazole-betamethasone (LOTRISONE) cream Apply 1 application topically 2 (two) times daily. 30 g 0  . escitalopram (LEXAPRO) 10 MG tablet Take 1 tablet (10 mg total) by mouth daily. 30 tablet 3  . famotidine (PEPCID) 20 MG tablet One after supper 30 tablet 11  . fluticasone (FLONASE) 50 MCG/ACT nasal spray Place 2 sprays into both nostrils daily. As needed for nasal vongestion 16 g 6  . furosemide (LASIX) 40 MG tablet Take 1 tablet (40 mg total) by mouth 2 (two) times daily. 60 tablet 3  . gabapentin (NEURONTIN) 300 MG capsule Take 1 capsule (300 mg total) by mouth 3 (three) times daily. 90 capsule 2  . glimepiride (AMARYL) 2 MG tablet TAKE 1 TABLET BY MOUTH DAILY BEFORE BREAKFAST 30 tablet 5  . glucose blood test strip Use one strip TID 100 each 12  . hydrOXYzine (ATARAX/VISTARIL) 10 MG tablet Take 1 tablet (10 mg total) by mouth every 8 (eight) hours as needed for itching. 60 tablet 1  . ketoconazole (NIZORAL) 2 % cream Apply 1 application topically daily. 30 g 0  .  ketotifen (ALAWAY) 0.025 % ophthalmic solution Place 1 drop into both eyes 2 (two) times daily. 5 mL 0  . Lancets (ACCU-CHEK SOFT TOUCH) lancets Use one new lancet TID 100 each 12  . NARCAN 4 MG/0.1ML LIQD nasal spray kit NAR REP ALN  0  . nystatin-triamcinolone ointment (MYCOLOG) Apply 1 application topically 2 (two) times daily. 30 g 0  . oxyCODONE-acetaminophen (PERCOCET/ROXICET) 5-325 MG tablet TK 1 T PO TID PRN  0  . pantoprazole (PROTONIX) 40 MG tablet Take 1 tablet (40 mg total) by mouth daily. Take 30-60 min before first meal of the day 30 tablet 2  . sacubitril-valsartan (ENTRESTO) 49-51 MG Take 1 tablet by mouth 2 (two) times daily.  180 tablet 0  . spironolactone (ALDACTONE) 25 MG tablet TAKE 1 TABLET(25 MG) BY MOUTH DAILY 30 tablet 5   No current facility-administered medications on file prior to visit.    Observations/Objective: Review of Systems  All other systems reviewed and are negative.   Assessment and Plan: Stacey Lin was seen today for loss of consciousness.  Diagnoses and all orders for this visit:  Syncope and collapse Self resolved probable due to exhaustion   Type 2 diabetes mellitus without complication, without long-term current use of insulin ([place]) EMS records unable to view asked if blood sure was low. Unable to remember the number but remembers they took it.  A1C 6.2 well controlled discussed signs and symptoms of hyper/hypoglycemia. No medication changes    Follow Up Instructions:    I discussed the assessment and treatment plan with the patient. The patient was provided an opportunity to ask questions and all were answered. The patient agreed with the plan and demonstrated an understanding of the instructions.   The patient was advised to call back or seek an in-person evaluation if the symptoms worsen or if the condition fails to improve as anticipated.  I provided 18 minutes of non-face-to-face time during this encounter.   [name], NP

## 2019-10-07 NOTE — Progress Notes (Signed)
Pt not sure what happened. When she woke up EMS was there. Patient refused to go to ED. States she slept through the night and woke up feeling very rejuvenated

## 2019-10-21 ENCOUNTER — Other Ambulatory Visit (INDEPENDENT_AMBULATORY_CARE_PROVIDER_SITE_OTHER): Payer: Self-pay | Admitting: Primary Care

## 2019-10-21 DIAGNOSIS — Z760 Encounter for issue of repeat prescription: Secondary | ICD-10-CM

## 2019-10-21 NOTE — Telephone Encounter (Signed)
FWD to PCP

## 2019-10-25 ENCOUNTER — Other Ambulatory Visit (INDEPENDENT_AMBULATORY_CARE_PROVIDER_SITE_OTHER): Payer: Self-pay | Admitting: Family Medicine

## 2019-10-25 DIAGNOSIS — E7841 Elevated Lipoprotein(a): Secondary | ICD-10-CM

## 2019-11-02 ENCOUNTER — Other Ambulatory Visit (INDEPENDENT_AMBULATORY_CARE_PROVIDER_SITE_OTHER): Payer: Self-pay | Admitting: Primary Care

## 2019-11-04 NOTE — Telephone Encounter (Signed)
FWD to PCP

## 2019-11-18 ENCOUNTER — Other Ambulatory Visit (HOSPITAL_COMMUNITY): Payer: Self-pay

## 2019-11-18 MED ORDER — ENTRESTO 49-51 MG PO TABS: 1.0000 | ORAL_TABLET | Freq: Two times a day (BID) | ORAL | 0 refills | Status: DC

## 2019-11-22 ENCOUNTER — Inpatient Hospital Stay: Admission: RE | Admit: 2019-11-22 | Payer: Medicare HMO | Source: Ambulatory Visit

## 2019-12-09 ENCOUNTER — Other Ambulatory Visit: Payer: Self-pay | Admitting: Internal Medicine

## 2019-12-09 MED ORDER — GABAPENTIN 300 MG PO CAPS: 300.0000 mg | ORAL_CAPSULE | Freq: Three times a day (TID) | ORAL | 0 refills | Status: DC

## 2019-12-11 MED ORDER — SPIRONOLACTONE 25 MG PO TABS: ORAL_TABLET | ORAL | 5 refills | Status: DC

## 2019-12-13 ENCOUNTER — Ambulatory Visit: Payer: Medicaid Other | Admitting: Podiatry

## 2019-12-18 ENCOUNTER — Ambulatory Visit: Payer: Medicare HMO | Admitting: Podiatry

## 2019-12-23 ENCOUNTER — Ambulatory Visit (INDEPENDENT_AMBULATORY_CARE_PROVIDER_SITE_OTHER): Payer: Medicare HMO | Admitting: Internal Medicine

## 2019-12-23 ENCOUNTER — Encounter: Payer: Self-pay | Admitting: Internal Medicine

## 2019-12-23 ENCOUNTER — Other Ambulatory Visit: Payer: Self-pay

## 2019-12-23 DIAGNOSIS — J4541 Moderate persistent asthma with (acute) exacerbation: Secondary | ICD-10-CM

## 2019-12-23 DIAGNOSIS — F17210 Nicotine dependence, cigarettes, uncomplicated: Secondary | ICD-10-CM

## 2019-12-23 MED ORDER — BUDESONIDE-FORMOTEROL FUMARATE 80-4.5 MCG/ACT IN AERO: INHALATION_SPRAY | RESPIRATORY_TRACT | 11 refills | Status: DC

## 2019-12-23 NOTE — Assessment & Plan Note (Signed)
Counseled re importance of smoking cessation but did not meet time criteria for separate billing   °

## 2019-12-23 NOTE — Patient Instructions (Addendum)
Stop all smoking   Symbicort 80 Take 2 puffs first thing in am and then another 2 puffs about 12 hours later.    Only use your albuterol (BLUE ventolin)  as a rescue medication to be used if you can't catch your breath by resting or doing a relaxed purse lip breathing pattern.  - The less you use it, the better it will work when you need it. - Ok to use up to 2 puffs  every 4 hours if you must but call for immediate appointment if use goes up over your usual need - Don't leave home without it !!  (think of it like the spare tire for your car)    Always take your medications with you to see your doctors (including me)  Based on the nature of your cough I recommend a trial off entresto for up to 6 weeks then return here with all your medications in hand if not satisfied your breathing/cough are better.  If you are satisfied, just tell your other doctors and see [place] as needed.  If your heartcare providers do not agree with a substitute for your entresto I will ask them to send you to another pulmonary specialist as I can't help solve this problem without doing this step first.

## 2019-12-23 NOTE — Assessment & Plan Note (Signed)
Quit smoking 11//2020  PFTs 10/2013:  FEV1 0.86 (41%), ratio 67, TLC 58%, unable to do DLCO Arlyce Harman as part of CPST 02/2014:  FEV1 1.19 (59%), ratio 72 Spiro 11/2014:  FEV1 1.08 (54%), ratio 80 - Spirometry 04/14/2016  FEV1 1.32 (68%)  Ratio 79 with nl effort indep portion of f/v loop  11/20/2018   resume symb 80 2bid x 2 week sample plus prednisone  X 6 days  then regroup with all meds in hand > did not do and as of 12/04/2018 no prednisone taken/still smoking  - Allergy profile 12/19/2018 >  Eos 0. /  IgE 8 rast neg  - 08/16/2019  After extensive coaching inhaler device,  effectiveness =    75%    DDX of  difficult airways management almost all start with A and  include Adherence, Ace Inhibitors, Acid Reflux, Active Sinus Disease, Alpha 1 Antitripsin deficiency, Anxiety masquerading as Airways dz,  ABPA,  Allergy(esp in young), Aspiration (esp in elderly), Adverse effects of meds,  Active smoking or vaping, A bunch of PE's (a small clot burden can't cause this syndrome unless there is already severe underlying pulm or vascular dz with poor reserve) plus two Bs  = Bronchiectasis and Beta blocker use..and one C= CHF   - return with all meds in hand using a trust but verify approach to confirm accurate Medication  Reconciliation The principal here is that until we are certain that the  patients are doing what we've asked, it makes no sense to ask them to do more.   Active smoking > see sep a/p  ? Acid (or non-acid) GERD > always difficult to exclude as up to 75% of pts in some series report no assoc GI/ Heartburn symptoms> rec continue max (24h)  acid suppression and diet restrictions/ reviewed     ? Adverse effects of meds > Endtresto at top of the usual list of suspects and the only way to rule it out is a trial off and if unable to try short term substitution then no need to return to see me and she can be referred to alternate provider   ? CHF/cardiac asthma   Each maintenance medication was reviewed  in detail including most importantly the difference between maintenance and as needed and under what circumstances the prns are to be used.  Please see AVS for specific  Instructions which are unique to this visit and I personally typed out  which were reviewed in detail over the phone with the patient and a copy provided via MyChart

## 2019-12-23 NOTE — Progress Notes (Signed)
Subjective:    Patient ID: Stacey Lin, female    DOB: [birth year]   MRN: [id number]    Brief patient profile:  59 yobf reports quit smoking 08/2019  previously seen by Dr. Shelle Iron 12/18/14 with dyspnea on exertion.She was felt to have asthmatic bronchitis but not COPD  rec Stop spiriva rx breo 100, take one inhalation each am proair respiclick, 2 inhalations every 6 hrs only if you are having severe breathing issues. Work on weight loss and conditioning. Will send a note to your cardiologist to see about getting you off lisinopril to see if this helps your dry tickling cough. You must stop smoking 100% in order to stay well.  followup with me again in 2mos to check on things> did not return.   History of Present Illness  02/12/2016: NP Follow up Office Visit: Patient presents to the office today with worsening dyspnea on exertion. She was seen at the heart failure clinic one day prior to OV   and they requested that she follow up with pulmonary. Spirometry 11/2014 showed  normal FEV1 with ? air trapping given her   FVC ? 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 She is continuing to smoke approx. 2 cigarettes daily per her history.    she has not been using her Breo inhaler daily as  Maintenance medication, but just when needed.   rec We will give you a Breo sample and new prescription for your Breo.( Maintenance inhaler) Use the Breo 1 puff twice daily. We will give you a prescription for your Pro Air Inhaler ( Rescue Inhaler) Use this every 6 hours as needed for SOB or Wheezing. Claritin ( loratadine) 1 every day for allergies Nasal Saline as needed for nasal congestion Continue weighing yourself daily per the heart failure clinic Follow up in 1 month > did  Not return        11/20/2018  Acute  ov/Yarel Kilcrease re: worse since stopped symbicort  Chief Complaint  Patient presents with  . Acute Visit    Increased SOB for the past 2 months. She gets winded walking from room to room at  home. She also c/o occ cough and wheezing. Her cough is mainly non prod.  She is using her albuterol inhaler 2 x per wk on average.   Dyspnea:  Gradually worse x 2 months to point to doe  Room to room = MMRC3  Cough: acutely worse x 2 days / harsh esp daytime > noct  Sleeping: on back with 2 pillows SABA use: confused with which ones help and when to take  rec Plan A = Automatic = symbicort 80 Take 2 puffs first thing in am and then another 2 puffs about 12 hours later.  Work on inhaler technique  The key is to stop smoking completely before smoking completely stops you!  Plan B = Backup Only use your albuterol (ventolin) inhaler  Please schedule a follow up office visit in 2  weeks, call sooner if needed with all medications /inhalers/ solutions in hand so we can verify exactly what you are taking. This includes all medications from all doctors and over the counters - PLEASE separate them into two bags:  the ones you take automatically, no matter what, vs the ones you take just when you feel you need them "BAG #2 is UP TO YOU"  - this will really help [place] help you take your medications more effectively.  - add: Prednisone 10 mg take  4 each am x  2 days,   2 each am x 2 days,  1 each am x 2 days and stop and spirometry on return     12/04/2018  f/u ov/Sujata Maines re:  AB / non adherence, still smoking  Chief Complaint  Patient presents with  . Follow-up    Breathing is unchanged. She never started the pred taper b/c she did not understand the instructions. She has not had to use her albuterol inhaler. She has had occ cough and wheezing at night.   Dyspnea:  MMRC3 = can't walk 100 yards even at a slow pace at a flat grade s stopping due to sob   Cough: daytime / not productive  Sleeping: ok after ambien flat bed 2 pillows  SABA use: not using as long as on symbbicort  02: none  rec  Prednisone 10 mg take  4 each am x 2 days,   2 each am x 2 days,  1 each am x 2 days and stop  Work on inhaler  technique:   Plan A = Automatic = Symbicort 80 Take 2 puffs first thing in am and then another 2 puffs about 12 hours later.  Plan B = Backup Only use your albuterol inhaler as a rescue medication Please schedule a follow up office visit in 2 weeks, sooner if needed  with all medications /inhalers/ solutions in hand so we can verify exactly what you are taking. This includes all medications from all doctors and over the counters     12/19/2018  f/u ov/Jaylene Arrowood re: cough x 3 months / maint on symbicort 80 2bid / still smoking Chief Complaint  Patient presents with  . Follow-up    Breathing is doing some better and she is coughing less. She has not had to use her albuterol inhaler.   Dyspnea:  Can't do [employer] due to sob and  R knee pain  Cough: more day than noct and dry Sleeping: bed is flat/ 2 pillows  SABA : not using at present  02: no Using lots of mints  rec Stop smoking if at all possible Brush your tongue with [employer] toothpaste and garlge with the slurry Prednisone 10 mg take  4 each am x 2 days,   2 each am x 2 days,  1 each am x 2 days and stop to see if helps  As needed > tessalon 200 mg every 6 hours as needed  GERD diet Please remember to go to the lab department   for your tests - we will call you with the results when they are available.    Please schedule a follow up office visit in 6 weeks, call sooner if needed with PFTs on return       08/16/2019  f/u ov/Jessyka Austria re: still smoking /never better fro last eval  Chief Complaint  Patient presents with  . Follow-up    C10: asthmatic bronchitis pt unable to perform PFT due to cough and congestion today.Pt has been coughing clear mucus for the past 3 days. She had covid testin10/20 and was not feeling well.  . Nasal Congestion    pt states cough is making her feel nauseous. Pt has not have fever, chills or bodyaches. Pt says today she has sore throat.  Dyspnea:  Room to room  Cough: clear mucus  Sleeping: 45  degrees on pillows x years  SABA use: helps 02: rec Work on inhaler technique:  relax and gently blow all the way out then  take a nice smooth deep breath back in, triggering the inhaler at same time you start breathing in.  Hold for up to 5 seconds if you can. Blow symbicort out thru nose. Rinse and gargle with water when done Only use your albuterol as a rescue medication   Stop all mints  zpak  Prednisone 10 mg take  4 each am x 2 days,   2 each am x 2 days,  1 each am x 2 days and stop  Increase gabapentin to 100 mg 4 x daily -bfast lunch supper and bedtime  The key is to stop smoking completely before smoking completely stops you!  Please schedule a follow up office visit in 2 weeks, sooner if needed  with all medications /inhalers/ solutions in hand so we can verify exactly what you are taking. 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 Follow-up    Cough is slightly better. She never started prilosec.   Dyspnea:  Room to room Cough: daytime and dry  Sleeping: on side 2 pillows  SABA use: not helping  02: none  rec Pantoprazole (protonix) 40 mg   Take  30-60 min before first meal of the day and Pepcid (famotidine)  20 mg one @  bedtime until return to office - this is the best way to tell whether stomach acid is contributing to your problem.   Change gabapentin to 300 mg three times daily  You may have to stop the [facility] and start a substitute but I will defer that to Dr Ricarda Frame works for cough but it is a last resort    Virtual Visit via Telephone Note 12/23/2019   I connected with Marchia Meiers on 12/23/19 at  9:30 AM EST by telephone and verified that I am speaking with the correct person using two identifiers.   I discussed the  limitations, risks, security and privacy concerns of performing an evaluation and management service by telephone and the availability of in person appointments. I also discussed with the patient that there may be a patient responsible charge related to this service. The patient expressed understanding and agreed to proceed.    History of Present Illness:  uacs vs ab Dyspnea:  No improvement doe  Still smoking / still on entresto and low dose coreg Cough: can't even start talking s dry sounding cough/ upper airway wheeze apparent  Sleeping: on side / bed is flat / 2 pillows  SABA use: not sure what she's using/ ran out of symbicort ? When?  02: none    No obvious day to day or daytime variability or assoc excess/ purulent sputum or mucus plugs or hemoptysis or cp or chest tightness, r overt sinus or hb symptoms.   Also denies any obvious fluctuation of symptoms with weather or environmental changes or other aggravating or alleviating factors except as outlined above.   Meds reviewed/ med reconciliation completed         Observations/Objective: Very hoarse, almost choking to speak    I personally reviewed images and agree with radiology impression as follows:  CXR:   09/02/2019 Stable mild cardiomegaly. No active lung disease.   Assessment and Plan: See problem list  for active a/p's   Follow Up Instructions: See avs for instructions unique to this ov which includes revised/ updated med list     I discussed the assessment and treatment plan with the patient. The patient was provided an opportunity to ask questions and all were answered. The patient agreed with the plan and demonstrated an understanding of the instructions.   The patient was advised to call back or seek an in-person evaluation if the symptoms worsen or if the condition fails to improve as anticipated.  I provided 25  minutes of non-face-to-face time during this encounter.   Sandrea Hughs, MD

## 2019-12-26 ENCOUNTER — Ambulatory Visit (HOSPITAL_COMMUNITY)
Admission: RE | Admit: 2019-12-26 | Discharge: 2019-12-26 | Disposition: A | Discharge status: Home / Self Care | Payer: Medicare HMO | Source: Ambulatory Visit | Attending: Cardiology | Admitting: Cardiology

## 2019-12-26 ENCOUNTER — Encounter (HOSPITAL_COMMUNITY): Payer: Self-pay

## 2019-12-26 ENCOUNTER — Other Ambulatory Visit: Payer: Self-pay

## 2019-12-26 VITALS — BP 114/76 | HR 68 | Wt 197.0 lb

## 2019-12-26 DIAGNOSIS — F17210 Nicotine dependence, cigarettes, uncomplicated: Secondary | ICD-10-CM | POA: Diagnosis not present

## 2019-12-26 DIAGNOSIS — I5022 Chronic systolic (congestive) heart failure: Secondary | ICD-10-CM

## 2019-12-26 DIAGNOSIS — Z79899 Other long term (current) drug therapy: Secondary | ICD-10-CM | POA: Diagnosis not present

## 2019-12-26 DIAGNOSIS — I110 Hypertensive heart disease with heart failure: Secondary | ICD-10-CM | POA: Insufficient documentation

## 2019-12-26 DIAGNOSIS — I428 Other cardiomyopathies: Secondary | ICD-10-CM | POA: Diagnosis not present

## 2019-12-26 DIAGNOSIS — Z7901 Long term (current) use of anticoagulants: Secondary | ICD-10-CM | POA: Diagnosis not present

## 2019-12-26 DIAGNOSIS — Z7984 Long term (current) use of oral hypoglycemic drugs: Secondary | ICD-10-CM | POA: Diagnosis not present

## 2019-12-26 DIAGNOSIS — Z8249 Family history of ischemic heart disease and other diseases of the circulatory system: Secondary | ICD-10-CM | POA: Diagnosis not present

## 2019-12-26 DIAGNOSIS — I5042 Chronic combined systolic (congestive) and diastolic (congestive) heart failure: Secondary | ICD-10-CM | POA: Diagnosis not present

## 2019-12-26 DIAGNOSIS — E7800 Pure hypercholesterolemia, unspecified: Secondary | ICD-10-CM | POA: Insufficient documentation

## 2019-12-26 MED ORDER — LOSARTAN POTASSIUM 25 MG PO TABS: 25.0000 mg | ORAL_TABLET | Freq: Every day | ORAL | 11 refills | Status: DC

## 2019-12-26 NOTE — Patient Instructions (Signed)
STOP Entresto  START Losartan 25mg  daily   Follow up in 3 weeks

## 2019-12-26 NOTE — Progress Notes (Signed)
Patient ID: Stacey Lin, female   DOB: 1966/01/25, 54 y.o.   MRN: [id number]    Advanced Heart Failure Clinic Note   PCP: Dr Altamease Oiler  Pulmonologist: Dr. Gwenette Greet HF: Dr. Haroldine Laws   HPI: Ms. Pinales is a 54 yo female with a history of anemia, combined systolic/diastolic heart failure, HTN, and history of polysubstance abuse.  Admitted to the hospital 15/40/08-67/6/19 with A/C systolic HF - EF 50-93%. Discharge weight 144 lbs. CATH 8/15 showed NORMAL CORS. EF 50-55%  Last echo 6/19 EF 55-60% mild to moderate AI, moderate TR, mid MR Personally reviewed.  She presents back for follow-up as advised by her pulmonologist Dr. Melvyn Novas.  Recently seen in his office 3/1 for increased dyspnea and persistent dry tickling cough.  She is felt to have asthmatic bronchitis but no COPD.  She is on inhalers.  Continues to smoke cigarettes.  She feels tired often.  Denies chest pain.  No weight gain.  No lower extremity orthopnea or PND.  She was euvolemic today both by exam and by Reds clip measurement, 28%.  EKG shows normal sinus rhythm no ischemic abnormalities.  Blood pressures well controlled.  She reports full medication compliance.  I have personally reviewed Dr. Gustavus Bryant progress note from 3/1.  It is felt that Delene Loll may be contributing to her pulmonary symptoms.  She was recently tested for Covid and negative. 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 Mod MR and Mod-sev TR Echo (1/15): EF 45%, mild to moderate AI, moderate MR, RV normal size with mildly decreased systolic function.  CPX (5/15): FVC 66%, FEV1 59%, ratio 72%, peak VO2 15.7, slope 22.6 => mildly decreased functional capacity limited primarily by lungs.  Echo (05/2014): EF 55-60%, mild/mod AI, grade I DD and mild  MR R/LHC (06/06/14): ectatic coronaries (suspect d/t HTN) no significant arthrosclerosis and normal EF. No significant MR noted Echo (7/16): EF 60-65%, moderate AI.  Echo (11/16): EF 55%, Mild LAE Echo 6/17: EF 20-25% severe central MR ECHO 03/2017: EF 6065% Grade IDD Mild MR  SH: Living in [place] boyfriend. No ETOH or drug use. 2 cigs/day.   FH: Mother deceased: Dementia, seizures, stomach cancer        Father deceased: HTN  Review of systems complete and found to be negative unless listed in HPI.    Past Medical History:  Diagnosis Date  . Anemia   . Anginal pain ([place])    2 months  . Arrhythmia   . Asthma   . CHF (congestive heart failure) ([place])    1990s  . Chronic headaches   . COPD (chronic obstructive pulmonary disease) ([place])    ??  . Hypercholesteremia   . Hypertension     Current Outpatient Medications  Medication Sig Dispense Refill  . albuterol (PROVENTIL HFA;VENTOLIN HFA) 108 (90 Base) MCG/ACT inhaler Inhale 2 puffs into the lungs every 6 (six) hours as needed for wheezing or shortness of breath. 1 Inhaler 5  . allopurinol (ZYLOPRIM) 100 MG tablet TAKE 1 TABLET BY MOUTH TWICE DAILY 60 tablet 5  . ammonium lactate (AMLACTIN) 12 % lotion Apply 1 application topically 2 (two) times daily. 400 g 4  . atorvastatin (LIPITOR) 40 MG tablet TAKE 1 TABLET BY MOUTH EVERY DAY 90 tablet 1  . baclofen (  Patient ID: Zalaya Messman, female   DOB: 07/15/1966, 53 y.o.   MRN: [id number]    Advanced Heart Failure Clinic Note   PCP: Dr Gomez  Pulmonologist: Dr. Clance HF: Dr. Bensimhon   HPI: Ms. Miller-Wetherbee is a 53 yo female with a history of anemia, combined systolic/diastolic heart failure, HTN, and history of polysubstance abuse.  Admitted to the hospital 08/19/13-08/29/13 with A/C systolic HF - EF 30-35%. Discharge weight 144 lbs. CATH 8/15 showed NORMAL CORS. EF 50-55%  Last echo 6/19 EF 55-60% mild to moderate AI, moderate TR, mid MR Personally reviewed.  She presents back for follow-up as advised by her pulmonologist Dr. Wert.  Recently seen in his office 3/1 for increased dyspnea and persistent dry tickling cough.  She is felt to have asthmatic bronchitis but no COPD.  She is on inhalers.  Continues to smoke cigarettes.  She feels tired often.  Denies chest pain.  No weight gain.  No lower extremity orthopnea or PND.  She was euvolemic today both by exam and by Reds clip measurement, 28%.  EKG shows normal sinus rhythm no ischemic abnormalities.  Blood pressures well controlled.  She reports full medication compliance.  I have personally reviewed Dr. Wert's progress note from 3/1.  It is felt that Entresto may be contributing to her pulmonary symptoms.  She was recently tested for Covid and negative. 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 Mod MR and Mod-sev TR Echo (1/15): EF 45%, mild to moderate AI, moderate MR, RV normal size with mildly decreased systolic function.  CPX (5/15): FVC 66%, FEV1 59%, ratio 72%, peak VO2 15.7, slope 22.6 => mildly decreased functional capacity limited primarily by lungs.  Echo (05/2014): EF 55-60%, mild/mod AI, grade I DD and mild  MR R/LHC (06/06/14): ectatic coronaries (suspect d/t HTN) no significant arthrosclerosis and normal EF. No significant MR noted Echo (7/16): EF 60-65%, moderate AI.  Echo (11/16): EF 55%, Mild LAE Echo 6/17: EF 20-25% severe central MR ECHO 03/2017: EF 6065% Grade IDD Mild MR  SH: Living in [place]. No ETOH or drug use. 2 cigs/day.   FH: Mother deceased: Dementia, seizures, stomach cancer        Father deceased: HTN  Review of systems complete and found to be negative unless listed in HPI.    Past Medical History:  Diagnosis Date  . Anemia   . Anginal pain ([facility])    2 months  . Arrhythmia   . Asthma   . CHF (congestive heart failure) ([facility])    1990s  . Chronic headaches   . COPD (chronic obstructive pulmonary disease) ([facility])    ??  . Hypercholesteremia   . Hypertension     Current Outpatient Medications  Medication Sig Dispense Refill  . albuterol (PROVENTIL HFA;VENTOLIN HFA) 108 (90 Base) MCG/ACT inhaler Inhale 2 puffs into the lungs every 6 (six) hours as needed for wheezing or shortness of breath. 1 Inhaler 5  . allopurinol (ZYLOPRIM) 100 MG tablet TAKE 1 TABLET BY MOUTH TWICE DAILY 60 tablet 5  . ammonium lactate (AMLACTIN) 12 % lotion Apply 1 application topically 2 (two) times daily. 400 g 4  . atorvastatin (LIPITOR) 40 MG tablet TAKE 1 TABLET BY MOUTH EVERY DAY 90 tablet 1  . baclofen (  Patient ID: Zalaya Messman, female   DOB: 07/15/1966, 53 y.o.   MRN: [id number]    Advanced Heart Failure Clinic Note   PCP: Dr Gomez  Pulmonologist: Dr. Clance HF: Dr. Bensimhon   HPI: Ms. Miller-Wetherbee is a 53 yo female with a history of anemia, combined systolic/diastolic heart failure, HTN, and history of polysubstance abuse.  Admitted to the hospital 08/19/13-08/29/13 with A/C systolic HF - EF 30-35%. Discharge weight 144 lbs. CATH 8/15 showed NORMAL CORS. EF 50-55%  Last echo 6/19 EF 55-60% mild to moderate AI, moderate TR, mid MR Personally reviewed.  She presents back for follow-up as advised by her pulmonologist Dr. Wert.  Recently seen in his office 3/1 for increased dyspnea and persistent dry tickling cough.  She is felt to have asthmatic bronchitis but no COPD.  She is on inhalers.  Continues to smoke cigarettes.  She feels tired often.  Denies chest pain.  No weight gain.  No lower extremity orthopnea or PND.  She was euvolemic today both by exam and by Reds clip measurement, 28%.  EKG shows normal sinus rhythm no ischemic abnormalities.  Blood pressures well controlled.  She reports full medication compliance.  I have personally reviewed Dr. Wert's progress note from 3/1.  It is felt that Entresto may be contributing to her pulmonary symptoms.  She was recently tested for Covid and negative. 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 Mod MR and Mod-sev TR Echo (1/15): EF 45%, mild to moderate AI, moderate MR, RV normal size with mildly decreased systolic function.  CPX (5/15): FVC 66%, FEV1 59%, ratio 72%, peak VO2 15.7, slope 22.6 => mildly decreased functional capacity limited primarily by lungs.  Echo (05/2014): EF 55-60%, mild/mod AI, grade I DD and mild  MR R/LHC (06/06/14): ectatic coronaries (suspect d/t HTN) no significant arthrosclerosis and normal EF. No significant MR noted Echo (7/16): EF 60-65%, moderate AI.  Echo (11/16): EF 55%, Mild LAE Echo 6/17: EF 20-25% severe central MR ECHO 03/2017: EF 6065% Grade IDD Mild MR  SH: Living in [place]. No ETOH or drug use. 2 cigs/day.   FH: Mother deceased: Dementia, seizures, stomach cancer        Father deceased: HTN  Review of systems complete and found to be negative unless listed in HPI.    Past Medical History:  Diagnosis Date  . Anemia   . Anginal pain ([facility])    2 months  . Arrhythmia   . Asthma   . CHF (congestive heart failure) ([facility])    1990s  . Chronic headaches   . COPD (chronic obstructive pulmonary disease) ([facility])    ??  . Hypercholesteremia   . Hypertension     Current Outpatient Medications  Medication Sig Dispense Refill  . albuterol (PROVENTIL HFA;VENTOLIN HFA) 108 (90 Base) MCG/ACT inhaler Inhale 2 puffs into the lungs every 6 (six) hours as needed for wheezing or shortness of breath. 1 Inhaler 5  . allopurinol (ZYLOPRIM) 100 MG tablet TAKE 1 TABLET BY MOUTH TWICE DAILY 60 tablet 5  . ammonium lactate (AMLACTIN) 12 % lotion Apply 1 application topically 2 (two) times daily. 400 g 4  . atorvastatin (LIPITOR) 40 MG tablet TAKE 1 TABLET BY MOUTH EVERY DAY 90 tablet 1  . baclofen (

## 2019-12-30 ENCOUNTER — Ambulatory Visit: Payer: Medicare HMO

## 2020-01-01 ENCOUNTER — Ambulatory Visit: Payer: Medicare HMO

## 2020-01-05 ENCOUNTER — Other Ambulatory Visit (INDEPENDENT_AMBULATORY_CARE_PROVIDER_SITE_OTHER): Payer: Self-pay | Admitting: Primary Care

## 2020-01-06 ENCOUNTER — Other Ambulatory Visit (HOSPITAL_COMMUNITY): Payer: Self-pay | Admitting: *Deleted

## 2020-01-06 MED ORDER — FUROSEMIDE 40 MG PO TABS: 40.0000 mg | ORAL_TABLET | Freq: Two times a day (BID) | ORAL | 3 refills | Status: DC

## 2020-01-06 NOTE — Telephone Encounter (Signed)
Sent to PCP ?

## 2020-01-13 ENCOUNTER — Encounter (HOSPITAL_COMMUNITY): Payer: Medicare HMO

## 2020-01-17 ENCOUNTER — Telehealth (INDEPENDENT_AMBULATORY_CARE_PROVIDER_SITE_OTHER): Payer: Self-pay

## 2020-01-17 NOTE — Telephone Encounter (Signed)
Patient called to request heating patch for her lower back pain. Patient states the pain is unbearable.    Patient uses   [facility] [phone] [place], [place]   Please advice [phone]

## 2020-01-21 NOTE — Telephone Encounter (Signed)
Patient advised to purchase heating patch over the counter.

## 2020-03-03 ENCOUNTER — Ambulatory Visit (INDEPENDENT_AMBULATORY_CARE_PROVIDER_SITE_OTHER): Payer: Medicare HMO | Admitting: Primary Care

## 2020-03-06 ENCOUNTER — Ambulatory Visit (INDEPENDENT_AMBULATORY_CARE_PROVIDER_SITE_OTHER): Payer: Medicare HMO | Admitting: Primary Care

## 2020-03-12 ENCOUNTER — Ambulatory Visit (INDEPENDENT_AMBULATORY_CARE_PROVIDER_SITE_OTHER): Payer: Medicare HMO | Admitting: Primary Care

## 2020-03-20 ENCOUNTER — Ambulatory Visit (INDEPENDENT_AMBULATORY_CARE_PROVIDER_SITE_OTHER): Payer: Medicare HMO | Admitting: Podiatry

## 2020-03-20 DIAGNOSIS — Z5329 Procedure and treatment not carried out because of patient's decision for other reasons: Secondary | ICD-10-CM

## 2020-03-20 NOTE — Progress Notes (Signed)
No show for appt. 

## 2020-03-24 ENCOUNTER — Ambulatory Visit (INDEPENDENT_AMBULATORY_CARE_PROVIDER_SITE_OTHER): Payer: Medicare HMO | Admitting: Primary Care

## 2020-04-14 ENCOUNTER — Ambulatory Visit: Payer: Medicare HMO | Admitting: Podiatry

## 2020-04-17 ENCOUNTER — Encounter: Payer: Self-pay | Admitting: Podiatry

## 2020-04-17 ENCOUNTER — Other Ambulatory Visit: Payer: Self-pay

## 2020-04-17 ENCOUNTER — Ambulatory Visit (INDEPENDENT_AMBULATORY_CARE_PROVIDER_SITE_OTHER): Payer: Medicare HMO | Admitting: Podiatry

## 2020-04-17 VITALS — Temp 96.7°F

## 2020-04-17 DIAGNOSIS — B351 Tinea unguium: Secondary | ICD-10-CM

## 2020-04-17 DIAGNOSIS — M79674 Pain in right toe(s): Secondary | ICD-10-CM

## 2020-04-17 DIAGNOSIS — E119 Type 2 diabetes mellitus without complications: Secondary | ICD-10-CM | POA: Diagnosis not present

## 2020-04-17 DIAGNOSIS — L84 Corns and callosities: Secondary | ICD-10-CM | POA: Diagnosis not present

## 2020-04-17 DIAGNOSIS — M79675 Pain in left toe(s): Secondary | ICD-10-CM

## 2020-04-17 NOTE — Patient Instructions (Addendum)
Diabetes Mellitus and Foot Care Foot care is an important part of your health, especially when you have diabetes. Diabetes may cause you to have problems because of poor blood flow (circulation) to your feet and legs, which can cause your skin to:  Become thinner and drier.  Break more easily.  Heal more slowly.  Peel and crack. You may also have nerve damage (neuropathy) in your legs and feet, causing decreased feeling in them. This means that you may not notice minor injuries to your feet that could lead to more serious problems. Noticing and addressing any potential problems early is the best way to prevent future foot problems. How to care for your feet Foot hygiene  Wash your feet daily with warm water and mild soap. Do not use hot water. Then, pat your feet and the areas between your toes until they are completely dry. Do not soak your feet as this can dry your skin.  Trim your toenails straight across. Do not dig under them or around the cuticle. File the edges of your nails with an emery board or nail file.  Apply a moisturizing lotion or petroleum jelly to the skin on your feet and to dry, brittle toenails. Use lotion that does not contain alcohol and is unscented. Do not apply lotion between your toes. Shoes and socks  Wear clean socks or stockings every day. Make sure they are not too tight. Do not wear knee-high stockings since they may decrease blood flow to your legs.  Wear shoes that fit properly and have enough cushioning. Always look in your shoes before you put them on to be sure there are no objects inside.  To break in new shoes, wear them for just a few hours a day. This prevents injuries on your feet. Wounds, scrapes, corns, and calluses  Check your feet daily for blisters, cuts, bruises, sores, and redness. If you cannot see the bottom of your feet, use a mirror or ask someone for help.  Do not cut corns or calluses or try to remove them with medicine.  If you  find a minor scrape, cut, or break in the skin on your feet, keep it and the skin around it clean and dry. You may clean these areas with mild soap and water. Do not clean the area with peroxide, alcohol, or iodine.  If you have a wound, scrape, corn, or callus on your foot, look at it several times a day to make sure it is healing and not infected. Check for: ? Redness, swelling, or pain. ? Fluid or blood. ? Warmth. ? Pus or a bad smell. General instructions  Do not cross your legs. This may decrease blood flow to your feet.  Do not use heating pads or hot water bottles on your feet. They may burn your skin. If you have lost feeling in your feet or legs, you may not know this is happening until it is too late.  Protect your feet from hot and cold by wearing shoes, such as at the beach or on hot pavement.  Schedule a complete foot exam at least once a year (annually) or more often if you have foot problems. If you have foot problems, report any cuts, sores, or bruises to your health care provider immediately. Contact a health care provider if:  You have a medical condition that increases your risk of infection and you have any cuts, sores, or bruises on your feet.  You have an injury that is not   healing.  You have redness on your legs or feet.  You feel burning or tingling in your legs or feet.  You have pain or cramps in your legs and feet.  Your legs or feet are numb.  Your feet always feel cold.  You have pain around a toenail. Get help right away if:  You have a wound, scrape, corn, or callus on your foot and: ? You have pain, swelling, or redness that gets worse. ? You have fluid or blood coming from the wound, scrape, corn, or callus. ? Your wound, scrape, corn, or callus feels warm to the touch. ? You have pus or a bad smell coming from the wound, scrape, corn, or callus. ? You have a fever. ? You have a red line going up your leg. Summary  Check your feet every day  for cuts, sores, red spots, swelling, and blisters.  Moisturize feet and legs daily.  Wear shoes that fit properly and have enough cushioning.  If you have foot problems, report any cuts, sores, or bruises to your health care provider immediately.  Schedule a complete foot exam at least once a year (annually) or more often if you have foot problems. This information is not intended to replace advice given to you by your health care provider. Make sure you discuss any questions you have with your health care provider. Document Revised: 07/03/2019 Document Reviewed: 11/11/2016 Elsevier Patient Education  2020 [employer]  
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
## 2020-05-01 ENCOUNTER — Other Ambulatory Visit (INDEPENDENT_AMBULATORY_CARE_PROVIDER_SITE_OTHER): Payer: Self-pay | Admitting: Primary Care

## 2020-05-01 NOTE — Telephone Encounter (Signed)
Morey Hummingbird from [facility] called for a medication refill request for pt   A list of  prescriptions that was not listed on meds list please advise    Call back [phone]

## 2020-05-04 ENCOUNTER — Other Ambulatory Visit (HOSPITAL_COMMUNITY): Payer: Self-pay | Admitting: *Deleted

## 2020-05-05 ENCOUNTER — Other Ambulatory Visit (HOSPITAL_COMMUNITY): Payer: Self-pay | Admitting: *Deleted

## 2020-05-05 NOTE — Telephone Encounter (Signed)
Unable to refill medications. Patient needs an OV. Last OV was in November. For DM, OV is every 3 months.  Will be able to refill these medications at the appt.  Allopurinol glimeperide Losartan Spironolactone Atorvastatin  escitalopram Furosemide Gabapentin  DM supplies, true metrix meter

## 2020-05-05 NOTE — Telephone Encounter (Signed)
Patient is already scheduled for appt on 7/14.

## 2020-05-06 ENCOUNTER — Other Ambulatory Visit (INDEPENDENT_AMBULATORY_CARE_PROVIDER_SITE_OTHER): Payer: Self-pay | Admitting: Primary Care

## 2020-05-06 ENCOUNTER — Encounter (INDEPENDENT_AMBULATORY_CARE_PROVIDER_SITE_OTHER): Payer: Self-pay

## 2020-05-06 ENCOUNTER — Other Ambulatory Visit: Payer: Self-pay

## 2020-05-06 ENCOUNTER — Encounter (INDEPENDENT_AMBULATORY_CARE_PROVIDER_SITE_OTHER): Payer: Medicare HMO | Admitting: Primary Care

## 2020-05-06 ENCOUNTER — Other Ambulatory Visit (HOSPITAL_COMMUNITY): Payer: Self-pay | Admitting: Cardiology

## 2020-05-06 DIAGNOSIS — Z760 Encounter for issue of repeat prescription: Secondary | ICD-10-CM

## 2020-05-08 ENCOUNTER — Encounter (INDEPENDENT_AMBULATORY_CARE_PROVIDER_SITE_OTHER): Payer: Self-pay | Admitting: Primary Care

## 2020-05-08 ENCOUNTER — Other Ambulatory Visit (INDEPENDENT_AMBULATORY_CARE_PROVIDER_SITE_OTHER): Payer: Self-pay | Admitting: Primary Care

## 2020-05-08 ENCOUNTER — Other Ambulatory Visit: Payer: Self-pay

## 2020-05-08 ENCOUNTER — Ambulatory Visit (INDEPENDENT_AMBULATORY_CARE_PROVIDER_SITE_OTHER): Payer: Medicare HMO | Admitting: Primary Care

## 2020-05-08 VITALS — BP 147/90 | HR 89 | Temp 98.1°F | Ht 60.0 in | Wt 187.0 lb

## 2020-05-08 DIAGNOSIS — Z91148 Patient's other noncompliance with medication regimen for other reason: Secondary | ICD-10-CM

## 2020-05-08 DIAGNOSIS — F17210 Nicotine dependence, cigarettes, uncomplicated: Secondary | ICD-10-CM

## 2020-05-08 DIAGNOSIS — I10 Essential (primary) hypertension: Secondary | ICD-10-CM

## 2020-05-08 DIAGNOSIS — E7841 Elevated Lipoprotein(a): Secondary | ICD-10-CM

## 2020-05-08 DIAGNOSIS — F418 Other specified anxiety disorders: Secondary | ICD-10-CM

## 2020-05-08 DIAGNOSIS — Z9114 Patient's other noncompliance with medication regimen: Secondary | ICD-10-CM

## 2020-05-08 DIAGNOSIS — Z760 Encounter for issue of repeat prescription: Secondary | ICD-10-CM

## 2020-05-08 DIAGNOSIS — E119 Type 2 diabetes mellitus without complications: Secondary | ICD-10-CM

## 2020-05-08 DIAGNOSIS — Z1211 Encounter for screening for malignant neoplasm of colon: Secondary | ICD-10-CM

## 2020-05-08 LAB — POCT GLYCOSYLATED HEMOGLOBIN (HGB A1C): Hemoglobin A1C: 6.3 % — AB (ref 4.0–5.6)

## 2020-05-08 LAB — GLUCOSE, POCT (MANUAL RESULT ENTRY): POC Glucose: 166 mg/dL — AB (ref 70–99)

## 2020-05-08 MED ORDER — GLIMEPIRIDE 2 MG PO TABS: ORAL_TABLET | ORAL | 5 refills | Status: DC

## 2020-05-08 MED ORDER — ACCU-CHEK SOFT TOUCH LANCETS MISC: 12 refills | Status: DC

## 2020-05-08 MED ORDER — ALLOPURINOL 100 MG PO TABS: 100.0000 mg | ORAL_TABLET | Freq: Two times a day (BID) | ORAL | 5 refills | Status: DC

## 2020-05-08 MED ORDER — FUROSEMIDE 40 MG PO TABS: 40.0000 mg | ORAL_TABLET | Freq: Two times a day (BID) | ORAL | 3 refills | Status: DC

## 2020-05-08 MED ORDER — ESCITALOPRAM OXALATE 20 MG PO TABS: 20.0000 mg | ORAL_TABLET | Freq: Every day | ORAL | 1 refills | Status: DC

## 2020-05-08 MED ORDER — GLUCOSE BLOOD VI STRP: ORAL_STRIP | 12 refills | Status: DC

## 2020-05-08 MED ORDER — ACCU-CHEK AVIVA PLUS W/DEVICE KIT: 1.0000 | PACK | Freq: Three times a day (TID) | 0 refills | Status: DC

## 2020-05-08 MED ORDER — SPIRONOLACTONE 25 MG PO TABS: ORAL_TABLET | ORAL | 5 refills | Status: DC

## 2020-05-08 MED ORDER — GABAPENTIN 300 MG PO CAPS: 300.0000 mg | ORAL_CAPSULE | Freq: Three times a day (TID) | ORAL | 0 refills | Status: DC

## 2020-05-08 MED ORDER — LOSARTAN POTASSIUM 25 MG PO TABS: 25.0000 mg | ORAL_TABLET | Freq: Every day | ORAL | 11 refills | Status: DC

## 2020-05-08 NOTE — Telephone Encounter (Signed)
Requested medication (s) are due for refill today: yes  Requested medication (s) are on the active medication list: yes   Future visit scheduled: yes  Notes to clinic:  patient had appointment today and would like medication sent to [facility]  Some medication were not filled by provider Please review for refill   Requested Prescriptions  Pending Prescriptions Disp Refills   spironolactone (ALDACTONE) 25 MG tablet 30 tablet 5    Sig: TAKE 1 TABLET(25 MG) BY MOUTH DAILY      Cardiovascular: Diuretics - Aldosterone Antagonist Failed - 05/08/2020 11:17 AM      Failed - Cr in normal range and within 360 days    Creat  Date Value Ref Range Status  09/16/2015 1.49 (H) 0.50 - 1.05 mg/dL Final   Creatinine, Ser  Date Value Ref Range Status  09/03/2019 1.18 (H) 0.44 - 1.00 mg/dL Final   Creatinine,U  Date Value Ref Range Status  08/05/2009 115.4 mg/dL Final    Comment:    See lab report for associated comment(s)          Failed - Last BP in normal range    BP Readings from Last 1 Encounters:  05/08/20 (!) 147/90          Passed - K in normal range and within 360 days    Potassium  Date Value Ref Range Status  09/03/2019 4.0 3.5 - 5.1 mmol/L Final          Passed - Na in normal range and within 360 days    Sodium  Date Value Ref Range Status  09/03/2019 142 135 - 145 mmol/L Final  11/27/2018 143 134 - 144 mmol/L Final          Passed - Valid encounter within last 6 months    Recent Outpatient Visits           Today Type 2 diabetes mellitus without complication, without long-term current use of insulin ([place])   [place], Michelle P, NP   2 days ago Type 2 diabetes mellitus without complication, without long-term current use of insulin ([place])   [place], Michelle P, NP   7 months ago Syncope and collapse   [facility] Juluis Mire P, NP   8 months ago Type 2 diabetes  mellitus without complication, without long-term current use of insulin ([place])   [place], Michelle P, NP   1 year ago Cigarette smoker   [place], Sharyn Lull P, NP                losartan (COZAAR) 25 MG tablet 30 tablet 11    Sig: Take 1 tablet (25 mg total) by mouth daily.      Cardiovascular:  Angiotensin Receptor Blockers Failed - 05/08/2020 11:17 AM      Failed - Cr in normal range and within 180 days    Creat  Date Value Ref Range Status  09/16/2015 1.49 (H) 0.50 - 1.05 mg/dL Final   Creatinine, Ser  Date Value Ref Range Status  09/03/2019 1.18 (H) 0.44 - 1.00 mg/dL Final   Creatinine,U  Date Value Ref Range Status  08/05/2009 115.4 mg/dL Final    Comment:    See lab report for associated comment(s)          Failed - K in normal range and within 180 days    Potassium  Date Value Ref Range Status  09/03/2019 4.0 3.5 - 5.1 mmol/L Final          Failed - Last BP in normal range    BP Readings from Last 1 Encounters:  05/08/20 (!) 147/90          Passed - Patient is not pregnant      Passed - Valid encounter within last 6 months    Recent Outpatient Visits           Today Type 2 diabetes mellitus without complication, without long-term current use of insulin ([place])   [place] [facility] Juluis Mire P, NP   2 days ago Type 2 diabetes mellitus without complication, without long-term current use of insulin ([place])   [place], Michelle P, NP   7 months ago Syncope and collapse   [facility] Juluis Mire P, NP   8 months ago Type 2 diabetes mellitus without complication, without long-term current use of insulin ([place])   [place] [name], NP   1 year ago Cigarette smoker   [place], [place] P, NP                glucose blood  test strip 100 each 12    Sig: Use one strip TID      Endocrinology: Diabetes - Testing Supplies Passed - 05/08/2020 11:17 AM      Passed - Valid encounter within last 12 months    Recent Outpatient Visits           Today Type 2 diabetes mellitus without complication, without long-term current use of insulin (HCC)   [place] [facility] Juluis Mire P, NP   2 days ago Type 2 diabetes mellitus without complication, without long-term current use of insulin ([place])   [place] Juluis Mire P, NP   7 months ago Syncope and collapse   [facility] Juluis Mire P, NP   8 months ago Type 2 diabetes mellitus without complication, without long-term current use of insulin ([place])   [place] [name], NP   1 year ago Cigarette smoker   [place], Michelle P, NP                escitalopram (LEXAPRO) 20 MG tablet 90 tablet 1    Sig: Take 1 tablet (20 mg total) by mouth daily.      Psychiatry:  Antidepressants - SSRI Passed - 05/08/2020 11:17 AM      Passed - Valid encounter within last 6 months    Recent Outpatient Visits           Today Type 2 diabetes mellitus without complication, without long-term current use of insulin ([place])   [place] [facility] [name], NP   2 days ago Type 2 diabetes mellitus without complication, without long-term current use of insulin ([place])   [place] [name], NP   7 months ago Syncope and collapse   [facility] Juluis Mire P, NP   8 months ago Type 2 diabetes mellitus without complication, without long-term current use of insulin ([place])   [place] [name], NP   1 year ago Cigarette smoker   [place]  [name], NP                 furosemide (LASIX) 40 MG tablet 60 tablet 3    Sig: Take 1 tablet (40 mg total) by mouth 2 (two) times daily.      Cardiovascular:  Diuretics - Loop Failed - 05/08/2020 11:17 AM      Failed - Cr in normal range and within 360 days    Creat  Date Value Ref Range Status  09/16/2015 1.49 (H) 0.50 - 1.05 mg/dL Final   Creatinine, Ser  Date Value Ref Range Status  09/03/2019 1.18 (H) 0.44 - 1.00 mg/dL Final   Creatinine,U  Date Value Ref Range Status  08/05/2009 115.4 mg/dL Final    Comment:    See lab report for associated comment(s)          Failed - Last BP in normal range    BP Readings from Last 1 Encounters:  05/08/20 (!) 147/90          Passed - K in normal range and within 360 days    Potassium  Date Value Ref Range Status  09/03/2019 4.0 3.5 - 5.1 mmol/L Final          Passed - Ca in normal range and within 360 days    Calcium  Date Value Ref Range Status  09/03/2019 9.2 8.9 - 10.3 mg/dL Final   Calcium, Ion  Date Value Ref Range Status  07/01/2014 1.11 (L) 1.12 - 1.23 mmol/L Final          Passed - Na in normal range and within 360 days    Sodium  Date Value Ref Range Status  09/03/2019 142 135 - 145 mmol/L Final  11/27/2018 143 134 - 144 mmol/L Final          Passed - Valid encounter within last 6 months    Recent Outpatient Visits           Today Type 2 diabetes mellitus without complication, without long-term current use of insulin (HCC)   [place] [facility] Juluis Mire P, NP   2 days ago Type 2 diabetes mellitus without complication, without long-term current use of insulin ([place])   [place] [facility] Juluis Mire P, NP   7 months ago Syncope and collapse   [facility] Juluis Mire P, NP   8 months ago Type 2 diabetes mellitus without complication, without long-term current use of insulin ([place])   [place] [name], NP   1 year ago  Cigarette smoker   [place], Sharyn Lull P, NP                gabapentin (NEURONTIN) 300 MG capsule 90 capsule 0    Sig: Take 1 capsule (300 mg total) by mouth 3 (three) times daily.      Neurology: Anticonvulsants - gabapentin Passed - 05/08/2020 11:17 AM      Passed - Valid encounter within last 12 months    Recent Outpatient Visits           Today Type 2 diabetes mellitus without complication, without long-term current use of insulin ([place])   [place] [facility] [name], NP   2 days ago Type 2 diabetes mellitus without complication, without long-term current use of insulin ([place])   [place] [name], NP   7  months ago Syncope and collapse   [facility] [name], NP   8 months ago Type 2 diabetes mellitus without complication, without long-term current use of insulin ([place])   [place] [name], NP   1 year ago Cigarette smoker   [place], Sharyn Lull P, NP                glimepiride (AMARYL) 2 MG tablet 30 tablet 5    Sig: Take 1 Tablet every morning after breakfast.      Endocrinology:  Diabetes - Sulfonylureas Passed - 05/08/2020 11:17 AM      Passed - HBA1C is between 0 and 7.9 and within 180 days    Hemoglobin A1C  Date Value Ref Range Status  05/08/2020 6.3 (A) 4.0 - 5.6 % Final   Hgb A1c MFr Bld  Date Value Ref Range Status  04/03/2016 5.0 4.8 - 5.6 % Final    Comment:    (NOTE)         Pre-diabetes: 5.7 - 6.4         Diabetes: >6.4         Glycemic control for adults with diabetes: <7.0           Passed - Valid encounter within last 6 months    Recent Outpatient Visits           Today Type 2 diabetes mellitus without complication, without long-term current use of insulin ([place])   [place] [facility] [name], NP   2  days ago Type 2 diabetes mellitus without complication, without long-term current use of insulin ([place])   [place], Michelle P, NP   7 months ago Syncope and collapse   [facility] Juluis Mire P, NP   8 months ago Type 2 diabetes mellitus without complication, without long-term current use of insulin ([place])   [place], [place], NP   1 year ago Cigarette smoker   [place], [place] P, NP                Lancets (ACCU-CHEK SOFT TOUCH) lancets 100 each 12    Sig: Use one new lancet TID      Endocrinology: Diabetes - Testing Supplies Passed - 05/08/2020 11:17 AM      Passed - Valid encounter within last 12 months    Recent Outpatient Visits           Today Type 2 diabetes mellitus without complication, without long-term current use of insulin ([place])   [place] [facility] Juluis Mire P, NP   2 days ago Type 2 diabetes mellitus without complication, without long-term current use of insulin ([place])   [place] Juluis Mire P, NP   7 months ago Syncope and collapse   [facility] Juluis Mire P, NP   8 months ago Type 2 diabetes mellitus without complication, without long-term current use of insulin ([place])   [place] [facility] [name], NP   1 year ago Cigarette smoker   [place], Sharyn Lull P, NP                Blood Glucose Monitoring Suppl (ACCU-CHEK AVIVA PLUS) w/Device KIT 1 kit 0    Sig: 1  each by Does not apply route 3 (three) times daily.      Endocrinology: Diabetes - Testing Supplies Passed - 05/08/2020 11:17 AM      Passed - Valid encounter within last 12 months    Recent Outpatient Visits           Today Type 2 diabetes mellitus without complication, without long-term current use of insulin  ([place])   [place] [facility] [name], NP   2 days ago Type 2 diabetes mellitus without complication, without long-term current use of insulin ([place])   [place], Michelle P, NP   7 months ago Syncope and collapse   [facility] Juluis Mire P, NP   8 months ago Type 2 diabetes mellitus without complication, without long-term current use of insulin ([place])   [place] [name], NP   1 year ago Cigarette smoker   [place], Sharyn Lull P, NP                allopurinol (ZYLOPRIM) 100 MG tablet 60 tablet 5    Sig: Take 1 tablet (100 mg total) by mouth 2 (two) times daily.      Endocrinology:  Gout Agents Failed - 05/08/2020 11:17 AM      Failed - Uric Acid in normal range and within 360 days    Uric Acid  Date Value Ref Range Status  04/12/2018 8.0 (H) 2.5 - 7.1 mg/dL Final    Comment:               Therapeutic target for gout patients: <6.0          Failed - Cr in normal range and within 360 days    Creat  Date Value Ref Range Status  09/16/2015 1.49 (H) 0.50 - 1.05 mg/dL Final   Creatinine, Ser  Date Value Ref Range Status  09/03/2019 1.18 (H) 0.44 - 1.00 mg/dL Final   Creatinine,U  Date Value Ref Range Status  08/05/2009 115.4 mg/dL Final    Comment:    See lab report for associated comment(s)          Passed - Valid encounter within last 12 months    Recent Outpatient Visits           Today Type 2 diabetes mellitus without complication, without long-term current use of insulin ([place])   [place] [facility] [name], NP   2 days ago Type 2 diabetes mellitus without complication, without long-term current use of insulin ([place])   [place], Michelle P, NP   7 months ago Syncope and collapse   [facility] [name], NP   8  months ago Type 2 diabetes mellitus without complication, without long-term current use of insulin ([place])   [place] [name], NP   1 year ago Cigarette smoker   [place], NP

## 2020-05-08 NOTE — Progress Notes (Signed)
Established Patient Office Visit  Subjective:  Patient ID: Stacey Lin, female    DOB: 11-30-1965  Age: 54 y.o. MRN: [id number]  CC:  Chief Complaint  Patient presents with  . Diabetes    HPI Stacey Lin is a 54 year old female who presents for the management of type 2 diabetes.  Stacey Lin denies polyuria, polydipsia, polyphagia,  shortness of breath, headaches, chest pain or lower and extremity edema  Past Medical History:  Diagnosis Date  . Anemia   . Anginal pain (HCC)    2 months  . Arrhythmia   . Asthma   . CHF (congestive heart failure) (HCC)    1990s  . Chronic headaches   . COPD (chronic obstructive pulmonary disease) (HCC)    ??  . Hypercholesteremia   . Hypertension     Past Surgical History:  Procedure Laterality Date  . LEFT AND RIGHT HEART CATHETERIZATION WITH CORONARY ANGIOGRAM N/A 06/06/2014   Procedure: LEFT AND RIGHT HEART CATHETERIZATION WITH CORONARY ANGIOGRAM;  Surgeon: Dolores Patty, MD;  Location: [facility] CATH LAB;  Service: Cardiovascular;  Laterality: N/A;  . MULTIPLE EXTRACTIONS WITH ALVEOLOPLASTY Bilateral 07/24/2015   Procedure: MULTIPLE EXTRACTIONS WITH ALVEOLOPLASTY,  BILATERAL TORI ;  Surgeon: Ocie Doyne, DDS;  Location: [facility] OR;  Service: Oral Surgery;  Laterality: Bilateral;  . TUBAL LIGATION  1986    Family History  Problem Relation Age of Onset  . Hypertension Mother   . Allergies Mother   . Asthma Mother   . Heart disease Mother   . Stomach cancer Mother        Deceased, 9  . Seizures Mother   . Hypertension Father        Deceased  . Hypertension Maternal Grandmother   . Healthy Brother   . Healthy Son   . Healthy Daughter     Social History   Socioeconomic History  . Marital status: Legally Separated    Spouse name: Not on file  . Number of children: Not on file  . Years of education: Not on file  . Highest education level: Not on file  Occupational History  . Not on file  Tobacco Use  . Smoking status:  Former Smoker    Packs/day: 1.00    Years: 35.00    Pack years: 35.00    Types: Cigarettes    Quit date: 08/26/2019    Years since quitting: 0.7  . Smokeless tobacco: Never Used  Vaping Use  . Vaping Use: Never used  Substance and Sexual Activity  . Alcohol use: No    Alcohol/week: 0.0 standard drinks  . Drug use: Not Currently    Frequency: 4.0 times per week    Types: "Crack" cocaine    Comment: RELAPSE 12/2016  . Sexual activity: Not on file  Other Topics Concern  . Not on file  Social History Narrative   Lives with son in an apartment on the third floor.  Does not work.  On disability.  Previously worked in [employer].    Education: high school.     Social Determinants of Health   Financial Resource Strain:   . Difficulty of Paying Living Expenses:   Food Insecurity:   . Worried About Programme researcher, broadcasting/film/video in the Last Year:   . Barista in the Last Year:   Transportation Needs:   . Freight forwarder (Medical):   Marland Kitchen Lack of Transportation (Non-Medical):   Physical Activity:   . Days of Exercise per Week:   .  Minutes of Exercise per Session:   Stress:   . Feeling of Stress :   Social Connections:   . Frequency of Communication with Friends and Family:   . Frequency of Social Gatherings with Friends and Family:   . Attends Religious Services:   . Active Member of Clubs or Organizations:   . Attends Banker Meetings:   Marland Kitchen Marital Status:   Intimate Partner Violence:   . Fear of Current or Ex-Partner:   . Emotionally Abused:   Marland Kitchen Physically Abused:   . Sexually Abused:     Outpatient Medications Prior to Visit  Medication Sig Dispense Refill  . albuterol (PROVENTIL HFA;VENTOLIN HFA) 108 (90 Base) MCG/ACT inhaler Inhale 2 puffs into the lungs every 6 (six) hours as needed for wheezing or shortness of breath. 1 Inhaler 5  . allopurinol (ZYLOPRIM) 100 MG tablet TAKE 1 TABLET BY MOUTH TWICE DAILY 60 tablet 5  . ammonium lactate (AMLACTIN) 12 %  lotion Apply 1 application topically 2 (two) times daily. 400 g 4  . atorvastatin (LIPITOR) 40 MG tablet TAKE 1 TABLET BY MOUTH EVERY DAY 90 tablet 1  . baclofen (LIORESAL) 10 MG tablet Take 20 mg by mouth at bedtime.    . Blood Glucose Monitoring Suppl (ACCU-CHEK AVIVA PLUS) w/Device KIT 1 each by Does not apply route 3 (three) times daily. 1 kit 0  . budesonide-formoterol (SYMBICORT) 80-4.5 MCG/ACT inhaler Take 2 puffs first thing in am and then another 2 puffs about 12 hours later. 1 Inhaler 11  . carvedilol (COREG) 3.125 MG tablet TAKE 1 TABLET BY MOUTH TWICE DAILY 60 tablet 5  . clotrimazole-betamethasone (LOTRISONE) cream Apply 1 application topically 2 (two) times daily. 30 g 0  . famotidine (PEPCID) 20 MG tablet One after supper 30 tablet 11  . fluticasone (FLONASE) 50 MCG/ACT nasal spray Place 2 sprays into both nostrils daily. As needed for nasal vongestion 16 g 6  . furosemide (LASIX) 40 MG tablet Take 1 tablet (40 mg total) by mouth 2 (two) times daily. 60 tablet 3  . gabapentin (NEURONTIN) 300 MG capsule Take 1 capsule (300 mg total) by mouth 3 (three) times daily. 90 capsule 0  . glimepiride (AMARYL) 2 MG tablet TAKE 1 TABLET BY MOUTH DAILY BEFORE BREAKFAST 30 tablet 5  . glucose blood test strip Use one strip TID 100 each 12  . hydrOXYzine (ATARAX/VISTARIL) 10 MG tablet Take 1 tablet (10 mg total) by mouth every 8 (eight) hours as needed for itching. 60 tablet 1  . ketoconazole (NIZORAL) 2 % cream Apply 1 application topically daily. 30 g 0  . ketotifen (ALAWAY) 0.025 % ophthalmic solution Place 1 drop into both eyes 2 (two) times daily. 5 mL 0  . Lancets (ACCU-CHEK SOFT TOUCH) lancets Use one new lancet TID 100 each 12  . losartan (COZAAR) 25 MG tablet Take 1 tablet (25 mg total) by mouth daily. 30 tablet 11  . NARCAN 4 MG/0.1ML LIQD nasal spray kit NAR REP ALN  0  . nystatin-triamcinolone ointment (MYCOLOG) Apply 1 application topically 2 (two) times daily. 30 g 0  .  oxyCODONE-acetaminophen (PERCOCET/ROXICET) 5-325 MG tablet TK 1 T PO TID PRN  0  . pantoprazole (PROTONIX) 40 MG tablet Take 1 tablet (40 mg total) by mouth daily. Take 30-60 min before first meal of the day 30 tablet 2  . spironolactone (ALDACTONE) 25 MG tablet TAKE 1 TABLET(25 MG) BY MOUTH DAILY 30 tablet 5  . escitalopram (LEXAPRO) 10 MG  tablet TAKE 1 TABLET BY MOUTH EVERY DAY 30 tablet 3   No facility-administered medications prior to visit.    Allergies  Allergen Reactions  . Ketoconazole Rash    ROS Review of Systems  Psychiatric/Behavioral: Positive for sleep disturbance. The patient is nervous/anxious.        Depression  All other systems reviewed and are negative.     Objective:    Physical Exam Vitals reviewed.  HENT:     Head: Normocephalic.     Right Ear: Tympanic membrane normal.     Left Ear: Tympanic membrane normal.     Nose: Nose normal.  Eyes:     Extraocular Movements: Extraocular movements intact.     Pupils: Pupils are equal, round, and reactive to light.  Cardiovascular:     Rate and Rhythm: Normal rate and regular rhythm.     Pulses: Normal pulses.     Heart sounds: Normal heart sounds.  Pulmonary:     Effort: Pulmonary effort is normal.     Breath sounds: Normal breath sounds.  Abdominal:     General: Bowel sounds are normal.  Musculoskeletal:        General: Normal range of motion.     Cervical back: Normal range of motion.  Skin:    General: Skin is warm and dry.  Neurological:     Mental Status: Stacey Lin is alert and oriented to person, place, and time.  Psychiatric:        Mood and Affect: Mood normal.        Behavior: Behavior normal.        Thought Content: Thought content normal.     BP (!) 147/90 (BP Location: Left Arm, Patient Position: Sitting, Cuff Size: Normal)   Pulse 89   Temp 98.1 F (36.7 C) (Oral)   Ht 5' (1.524 m)   Wt 187 lb (84.8 kg)   LMP  (LMP Unknown)   SpO2 94%   BMI 36.52 kg/m  Wt Readings from Last 3  Encounters:  05/08/20 187 lb (84.8 kg)  12/26/19 197 lb (89.4 kg)  09/16/19 201 lb 3.2 oz (91.3 kg)     Health Maintenance Due  Topic Date Due  . COVID-19 Vaccine (1) Never done  . Fecal DNA (Cologuard)  Never done  . OPHTHALMOLOGY EXAM  10/05/2019  . HEMOGLOBIN A1C  03/03/2020    There are no preventive care reminders to display for this patient.  Lab Results  Component Value Date   TSH 2.240 11/27/2018   Lab Results  Component Value Date   WBC 11.6 (H) 12/19/2018   HGB 12.0 12/19/2018   HCT 36.3 12/19/2018   MCV 91.1 12/19/2018   PLT 321.0 12/19/2018   Lab Results  Component Value Date   NA 142 09/03/2019   K 4.0 09/03/2019   CO2 29 09/03/2019   GLUCOSE 174 (H) 09/03/2019   BUN 22 (H) 09/03/2019   CREATININE 1.18 (H) 09/03/2019   BILITOT 0.3 09/17/2018   ALKPHOS 142 (H) 09/19/2018   AST 20 09/17/2018   ALT 24 09/17/2018   PROT 7.3 09/17/2018   ALBUMIN 4.6 09/17/2018   CALCIUM 9.2 09/03/2019   ANIONGAP 10 09/03/2019   Lab Results  Component Value Date   CHOL 218 (H) 01/09/2018   Lab Results  Component Value Date   HDL 41 01/09/2018   Lab Results  Component Value Date   LDLCALC 152 (H) 01/09/2018   Lab Results  Component Value Date   TRIG 123  01/09/2018   Lab Results  Component Value Date   CHOLHDL 5.3 (H) 01/09/2018   Lab Results  Component Value Date   HGBA1C 6.2 (A) 09/04/2019      Assessment & Plan:  Stacey Lin was seen today for diabetes.  Diagnoses and all orders for this visit:  Type 2 diabetes mellitus without complication, without long-term current use of insulin (HCC) Controlled per ADA guidelines less than equal to 6.4 continue current medications Amaryl 2 mg daily -     HgB A1c 6.3 -     Glucose (CBG) -     CBC with Differential/Platelet; Future -     Ambulatory referral to Ophthalmology  Essential hypertension, benign Counseled on blood pressure goal of less than 130/80, low-sodium, DASH diet, medication compliance, 150  minutes of moderate intensity exercise per week. , losartan 25 mg daily Congestive heart failure managed by Heart care-Dr. Bensimhon. -     CMP14+EGFR; Future  Non compliance w medication regimen Not consistent with medical management and adherence to medication  Cigarette smoker Patient is aware of the increased risk for lung cancer and other respiratory diseases recommend cessation.  To include followed by Dr. Shelle Iron pulmonologist this will be reminded at each clinical visit.  Elevated lipoprotein(a) -     Lipid panel; Future  Screening for colon cancer -     Ambulatory referral to Gastroenterology  Depression with anxiety Screening tool   Office Visit from 05/08/2020 in [facility]  PHQ-9 Total Score 18    Lexapro may also aid with insomnia -     escitalopram (LEXAPRO) 20 MG tablet; Take 1 tablet (20 mg total) by mouth daily. -       Meds ordered this encounter  Medications  . escitalopram (LEXAPRO) 20 MG tablet    Sig: Take 1 tablet (20 mg total) by mouth daily.    Dispense:  90 tablet    Refill:  1    Follow-up: Return in about 6 weeks (around 06/19/2020) for in person effectiveness of medication.    Grayce Sessions, NP

## 2020-05-08 NOTE — Patient Instructions (Addendum)
Increased Lexapro (escitalopram ) take 2 of your 10mg  until Gone. New prescription sent in for escitalopram 20 take 1 at bed time Generalized Anxiety Disorder, Adult Generalized anxiety disorder (GAD) is a mental health disorder. People with this condition constantly worry about everyday events. Unlike normal anxiety, worry related to GAD is not triggered by a specific event. These worries also do not fade or get better with time. GAD interferes with life functions, including relationships, work, and school. GAD can vary from mild to severe. People with severe GAD can have intense waves of anxiety with physical symptoms (panic attacks). What are the causes? The exact cause of GAD is not known. What increases the risk? This condition is more likely to develop in:  Women.  People who have a family history of anxiety disorders.  People who are very shy.  People who experience very stressful life events, such as the death of a loved one.  People who have a very stressful family environment. What are the signs or symptoms? People with GAD often worry excessively about many things in their lives, such as their health and family. They may also be overly concerned about:  Doing well at work.  Being on time.  Natural disasters.  Friendships. Physical symptoms of GAD include:  Fatigue.  Muscle tension or having muscle twitches.  Trembling or feeling shaky.  Being easily startled.  Feeling like your heart is pounding or racing.  Feeling out of breath or like you cannot take a deep breath.  Having trouble falling asleep or staying asleep.  Sweating.  Nausea, diarrhea, or irritable bowel syndrome (IBS).  Headaches.  Trouble concentrating or remembering facts.  Restlessness.  Irritability. How is this diagnosed? Your health care provider can diagnose GAD based on your symptoms and medical history. You will also have a physical exam. The health care provider will ask specific  questions about your symptoms, including how severe they are, when they started, and if they come and go. Your health care provider may ask you about your use of alcohol or drugs, including prescription medicines. Your health care provider may refer you to a mental health specialist for further evaluation. Your health care provider will do a thorough examination and may perform additional tests to rule out other possible causes of your symptoms. To be diagnosed with GAD, a person must have anxiety that:  Is out of his or her control.  Affects several different aspects of his or her life, such as work and relationships.  Causes distress that makes him or her unable to take part in normal activities.  Includes at least three physical symptoms of GAD, such as restlessness, fatigue, trouble concentrating, irritability, muscle tension, or sleep problems. Before your health care provider can confirm a diagnosis of GAD, these symptoms must be present more days than they are not, and they must last for six months or longer. How is this treated? The following therapies are usually used to treat GAD:  Medicine. Antidepressant medicine is usually prescribed for long-term daily control. Antianxiety medicines may be added in severe cases, especially when panic attacks occur.  Talk therapy (psychotherapy). Certain types of talk therapy can be helpful in treating GAD by providing support, education, and guidance. Options include: ? Cognitive behavioral therapy (CBT). People learn coping skills and techniques to ease their anxiety. They learn to identify unrealistic or negative thoughts and behaviors and to replace them with positive ones. ? Acceptance and commitment therapy (ACT). This treatment teaches people how to  be mindful as a way to cope with unwanted thoughts and feelings. ? Biofeedback. This process trains you to manage your body's response (physiological response) through breathing techniques and  relaxation methods. You will work with a therapist while machines are used to monitor your physical symptoms.  Stress management techniques. These include yoga, meditation, and exercise. A mental health specialist can help determine which treatment is best for you. Some people see improvement with one type of therapy. However, other people require a combination of therapies. Follow these instructions at home:  Take over-the-counter and prescription medicines only as told by your health care provider.  Try to maintain a normal routine.  Try to anticipate stressful situations and allow extra time to manage them.  Practice any stress management or self-calming techniques as taught by your health care provider.  Do not punish yourself for setbacks or for not making progress.  Try to recognize your accomplishments, even if they are small.  Keep all follow-up visits as told by your health care provider. This is important. Contact a health care provider if:  Your symptoms do not get better.  Your symptoms get worse.  You have signs of depression, such as: ? A persistently sad, cranky, or irritable mood. ? Loss of enjoyment in activities that used to bring you joy. ? Change in weight or eating. ? Changes in sleeping habits. ? Avoiding friends or family members. ? Loss of energy for normal tasks. ? Feelings of guilt or worthlessness. Get help right away if:  You have serious thoughts about hurting yourself or others. If you ever feel like you may hurt yourself or others, or have thoughts about taking your own life, get help right away. You can go to your nearest emergency department or call:  Your local emergency services (911 in the [place]  A suicide crisis helpline, such as the [facility] at 901 324 9242. This is open 24 hours a day. Summary  Generalized anxiety disorder (GAD) is a mental health disorder that involves worry that is not triggered by a  specific event.  People with GAD often worry excessively about many things in their lives, such as their health and family.  GAD may cause physical symptoms such as restlessness, trouble concentrating, sleep problems, frequent sweating, nausea, diarrhea, headaches, and trembling or muscle twitching.  A mental health specialist can help determine which treatment is best for you. Some people see improvement with one type of therapy. However, other people require a combination of therapies. This information is not intended to replace advice given to you by your health care provider. Make sure you discuss any questions you have with your health care provider. Document Revised: 09/22/2017 Document Reviewed: 08/30/2016 Elsevier Patient Education  2020 [employer].

## 2020-05-08 NOTE — Telephone Encounter (Signed)
Medication: glimepiride (AMARYL) 2 MG tablet [[id number]] , escitalopram (LEXAPRO) 20 MG tablet [[id number]] , losartan (COZAAR) 25 MG tablet [[id number]] , spironolactone (ALDACTONE) 25 MG tablet [[id number]] , atorvastatin (LIPITOR) 40 MG tablet [[id number]] , furosemide (LASIX) 40 MG tablet [[id number]] , gabapentin (NEURONTIN) 300 MG capsule [[id number]] , true Metrix Air Meter Glucose Kit, True Metrix test strips, True Pulus 33 gauge lancets, Alchol swabs, allopurinol (ZYLOPRIM) 100 MG tablet [[id number]]   Some of the above scripts have already been sent to the local pharmacy. Can all of these be sent to the pharmacy below please?  Has the patient contacted their pharmacy? YES  (Agent: If no, request that the patient contact the pharmacy for the refill.) (Agent: If yes, when and what did the pharmacy advise?)  Preferred Pharmacy (with phone number or street name): [facility] [place] [phone]- phone [phone]-fax  Agent: Please be advised that RX refills may take up to 3 business days. We ask that you follow-up with your pharmacy.

## 2020-05-11 ENCOUNTER — Other Ambulatory Visit (HOSPITAL_COMMUNITY): Payer: Self-pay | Admitting: *Deleted

## 2020-05-15 MED ORDER — GLIMEPIRIDE 2 MG PO TABS: ORAL_TABLET | ORAL | 1 refills | Status: DC

## 2020-05-20 ENCOUNTER — Other Ambulatory Visit (INDEPENDENT_AMBULATORY_CARE_PROVIDER_SITE_OTHER): Payer: Self-pay | Admitting: Primary Care

## 2020-05-20 DIAGNOSIS — E119 Type 2 diabetes mellitus without complications: Secondary | ICD-10-CM

## 2020-05-20 DIAGNOSIS — E7841 Elevated Lipoprotein(a): Secondary | ICD-10-CM

## 2020-05-20 MED ORDER — ATORVASTATIN CALCIUM 40 MG PO TABS: 40.0000 mg | ORAL_TABLET | Freq: Every day | ORAL | 3 refills | Status: DC

## 2020-05-20 MED ORDER — METFORMIN HCL ER (OSM) 1000 MG PO TB24: 1000.0000 mg | ORAL_TABLET | Freq: Every day | ORAL | 1 refills | Status: DC

## 2020-05-21 NOTE — Progress Notes (Signed)
error 

## 2020-05-25 ENCOUNTER — Other Ambulatory Visit (INDEPENDENT_AMBULATORY_CARE_PROVIDER_SITE_OTHER): Payer: Self-pay | Admitting: Primary Care

## 2020-05-25 DIAGNOSIS — E119 Type 2 diabetes mellitus without complications: Secondary | ICD-10-CM

## 2020-05-25 MED ORDER — METFORMIN HCL ER 500 MG PO TB24: 1000.0000 mg | ORAL_TABLET | Freq: Every day | ORAL | 1 refills | Status: DC

## 2020-05-25 NOTE — Telephone Encounter (Signed)
Notes to clinic:  METFORMIN ER 1000MG IS NOT COVERED - THE COVERED ALTERNATIVE IS METFORMIN ER 500MG TAB (AB1) - 2 TABS ONCE A DAY - MAY WE CHANGE THE RX TO THE COVERED ALTERNATIVE - YES OR NO? THANKS SO MUCH!   Requested Prescriptions  Pending Prescriptions Disp Refills   metformin (FORTAMET) 1000 MG (OSM) 24 hr tablet [Pharmacy Med Name: METFORMIN HYDROCHLORIDE ER 1000 MG Tablet Extended Release 24 Hour] 90 tablet 1    Sig: TAKE 1 TABLET EVERY DAY WITH BREAKFAST      Endocrinology:  Diabetes - Biguanides Failed - 05/25/2020 10:26 AM      Failed - Cr in normal range and within 360 days    Creat  Date Value Ref Range Status  09/16/2015 1.49 (H) 0.50 - 1.05 mg/dL Final   Creatinine, Ser  Date Value Ref Range Status  09/03/2019 1.18 (H) 0.44 - 1.00 mg/dL Final   Creatinine,U  Date Value Ref Range Status  08/05/2009 115.4 mg/dL Final    Comment:    See lab report for associated comment(s)          Passed - HBA1C is between 0 and 7.9 and within 180 days    Hemoglobin A1C  Date Value Ref Range Status  05/08/2020 6.3 (A) 4.0 - 5.6 % Final   Hgb A1c MFr Bld  Date Value Ref Range Status  04/03/2016 5.0 4.8 - 5.6 % Final    Comment:    (NOTE)         Pre-diabetes: 5.7 - 6.4         Diabetes: >6.4         Glycemic control for adults with diabetes: <7.0           Passed - eGFR in normal range and within 360 days    GFR, Est African American  Date Value Ref Range Status  03/31/2014 62 mL/min Final   GFR calc Af Amer  Date Value Ref Range Status  09/03/2019 >60 >60 mL/min Final   GFR, Est Non African American  Date Value Ref Range Status  03/31/2014 54 (L) mL/min Final    Comment:      The estimated GFR is a calculation valid for adults (>=75 years old) that uses the CKD-EPI algorithm to adjust for age and sex. It is   not to be used for children, pregnant women, hospitalized patients,    patients on dialysis, or with rapidly changing kidney function. According to  the NKDEP, eGFR >89 is normal, 60-89 shows mild impairment, 30-59 shows moderate impairment, 15-29 shows severe impairment and <15 is ESRD.     GFR calc non Af Amer  Date Value Ref Range Status  09/03/2019 52 (L) >60 mL/min Final          Passed - Valid encounter within last 6 months    Recent Outpatient Visits           2 weeks ago Type 2 diabetes mellitus without complication, without long-term current use of insulin ([place])   [place] [name], NP   7 months ago Syncope and collapse   [facility] Juluis Mire P, NP   8 months ago Type 2 diabetes mellitus without complication, without long-term current use of insulin ([place])   [place], [place], NP   1 year ago Cigarette smoker   [place], NP  1 year ago Upper airway cough syndrome   [facility] [name], NP

## 2020-05-29 ENCOUNTER — Telehealth (INDEPENDENT_AMBULATORY_CARE_PROVIDER_SITE_OTHER): Payer: Self-pay | Admitting: Primary Care

## 2020-05-29 NOTE — Telephone Encounter (Signed)
Patient called to speak with the nurse regarding a medication she received in the mail.  She is not familiar with this medication.  Please call patient to discuss at [phone]

## 2020-06-01 NOTE — Telephone Encounter (Signed)
Spoke with patient she is aware that medication(metformin) is for her diabetes.

## 2020-06-01 NOTE — Telephone Encounter (Signed)
Patient is calling back to hear from the nurse or doctor regarding her medication.  Please call patient to discuss asap.

## 2020-06-17 ENCOUNTER — Other Ambulatory Visit: Payer: Self-pay | Admitting: Internal Medicine

## 2020-06-19 ENCOUNTER — Ambulatory Visit (INDEPENDENT_AMBULATORY_CARE_PROVIDER_SITE_OTHER): Payer: Medicare HMO | Admitting: Primary Care

## 2020-06-23 ENCOUNTER — Encounter (HOSPITAL_COMMUNITY): Payer: Medicare HMO

## 2020-06-30 ENCOUNTER — Ambulatory Visit (INDEPENDENT_AMBULATORY_CARE_PROVIDER_SITE_OTHER): Payer: Medicare HMO | Admitting: Primary Care

## 2020-07-11 ENCOUNTER — Other Ambulatory Visit (INDEPENDENT_AMBULATORY_CARE_PROVIDER_SITE_OTHER): Payer: Self-pay | Admitting: Family Medicine

## 2020-07-11 NOTE — Telephone Encounter (Signed)
Requested Prescriptions  Pending Prescriptions Disp Refills  . furosemide (LASIX) 40 MG tablet [Pharmacy Med Name: FUROSEMIDE 40 MG Tablet] 180 tablet 0    Sig: TAKE 1 TABLET TWICE DAILY     Cardiovascular:  Diuretics - Loop Failed - 07/11/2020  3:37 PM      Failed - Cr in normal range and within 360 days    Creat  Date Value Ref Range Status  09/16/2015 1.49 (H) 0.50 - 1.05 mg/dL Final   Creatinine, Ser  Date Value Ref Range Status  09/03/2019 1.18 (H) 0.44 - 1.00 mg/dL Final   Creatinine,U  Date Value Ref Range Status  08/05/2009 115.4 mg/dL Final    Comment:    See lab report for associated comment(s)         Failed - Last BP in normal range    BP Readings from Last 1 Encounters:  05/08/20 (!) 147/90         Passed - K in normal range and within 360 days    Potassium  Date Value Ref Range Status  09/03/2019 4.0 3.5 - 5.1 mmol/L Final         Passed - Ca in normal range and within 360 days    Calcium  Date Value Ref Range Status  09/03/2019 9.2 8.9 - 10.3 mg/dL Final   Calcium, Ion  Date Value Ref Range Status  07/01/2014 1.11 (L) 1.12 - 1.23 mmol/L Final         Passed - Na in normal range and within 360 days    Sodium  Date Value Ref Range Status  09/03/2019 142 135 - 145 mmol/L Final  11/27/2018 143 134 - 144 mmol/L Final         Passed - Valid encounter within last 6 months    Recent Outpatient Visits          2 months ago Type 2 diabetes mellitus without complication, without long-term current use of insulin ([place])   [place] [facility] Juluis Mire P, NP   9 months ago Syncope and collapse   [facility] Juluis Mire P, NP   10 months ago Type 2 diabetes mellitus without complication, without long-term current use of insulin ([place])   [place], [place], NP   1 year ago Cigarette smoker   [place], [place], NP   1 year ago  Upper airway cough syndrome   [facility] [name], NP

## 2020-07-24 ENCOUNTER — Ambulatory Visit: Payer: Medicare HMO | Admitting: Podiatry

## 2020-07-29 ENCOUNTER — Other Ambulatory Visit: Payer: Self-pay

## 2020-07-29 ENCOUNTER — Ambulatory Visit (INDEPENDENT_AMBULATORY_CARE_PROVIDER_SITE_OTHER): Payer: Medicare HMO | Admitting: Primary Care

## 2020-07-29 ENCOUNTER — Encounter (INDEPENDENT_AMBULATORY_CARE_PROVIDER_SITE_OTHER): Payer: Self-pay | Admitting: Primary Care

## 2020-07-29 VITALS — BP 146/94 | HR 81 | Temp 97.5°F | Ht 60.0 in | Wt 185.8 lb

## 2020-07-29 DIAGNOSIS — I10 Essential (primary) hypertension: Secondary | ICD-10-CM

## 2020-07-29 DIAGNOSIS — F418 Other specified anxiety disorders: Secondary | ICD-10-CM

## 2020-07-29 MED ORDER — ALPRAZOLAM 1 MG PO TABS: 1.0000 mg | ORAL_TABLET | Freq: Two times a day (BID) | ORAL | 0 refills | Status: DC | PRN

## 2020-07-29 NOTE — Patient Instructions (Signed)

## 2020-07-30 NOTE — Progress Notes (Signed)
Established Patient Office Visit  Subjective:  Patient ID: Stacey Lin, female    DOB: 07/23/1966  Age: 54 y.o. MRN: [id number]  CC:  Chief Complaint  Patient presents with  . medication effectiveness    HPI [name] is a 54 year old female who presents for medication effectiveness for anxiety.  Recurrent problem where she is unable to say no and she picks up friends and family and take them to work and then go back to get.  Today she is crying again talking and singing she is just tired.  She received a call during this appointment that her granddaughter is sick and needs to pick her up.  Patient stated her brother which is 63 can walk across the street and get her.  Blood pressure will remain uncontrolled as long as she allows outside sources to stressor. Past Medical History:  Diagnosis Date  . Anemia   . Anginal pain (HCC)    2 months  . Arrhythmia   . Asthma   . CHF (congestive heart failure) (HCC)    1990s  . Chronic headaches   . COPD (chronic obstructive pulmonary disease) (HCC)    ??  . Hypercholesteremia   . Hypertension     Past Surgical History:  Procedure Laterality Date  . LEFT AND RIGHT HEART CATHETERIZATION WITH CORONARY ANGIOGRAM N/A 06/06/2014   Procedure: LEFT AND RIGHT HEART CATHETERIZATION WITH CORONARY ANGIOGRAM;  Surgeon: Dolores Patty, MD;  Location: [facility] CATH LAB;  Service: Cardiovascular;  Laterality: N/A;  . MULTIPLE EXTRACTIONS WITH ALVEOLOPLASTY Bilateral 07/24/2015   Procedure: MULTIPLE EXTRACTIONS WITH ALVEOLOPLASTY,  BILATERAL TORI ;  Surgeon: Ocie Doyne, DDS;  Location: [facility] OR;  Service: Oral Surgery;  Laterality: Bilateral;  . TUBAL LIGATION  1986    Family History  Problem Relation Age of Onset  . Hypertension Mother   . Allergies Mother   . Asthma Mother   . Heart disease Mother   . Stomach cancer Mother        Deceased, 30  . Seizures Mother   . Hypertension Father        Deceased  . Hypertension Maternal  Grandmother   . Healthy Brother   . Healthy Son   . Healthy Daughter     Social History   Socioeconomic History  . Marital status: Legally Separated    Spouse name: Not on file  . Number of children: Not on file  . Years of education: Not on file  . Highest education level: Not on file  Occupational History  . Not on file  Tobacco Use  . Smoking status: Former Smoker    Packs/day: 1.00    Years: 35.00    Pack years: 35.00    Types: Cigarettes    Quit date: 08/26/2019    Years since quitting: 0.9  . Smokeless tobacco: Never Used  Vaping Use  . Vaping Use: Never used  Substance and Sexual Activity  . Alcohol use: No    Alcohol/week: 0.0 standard drinks  . Drug use: Not Currently    Frequency: 4.0 times per week    Types: "Crack" cocaine    Comment: RELAPSE 12/2016  . Sexual activity: Not on file  Other Topics Concern  . Not on file  Social History Narrative   Lives with son in an apartment on the third floor.  Does not work.  On disability.  Previously worked in [employer].    Education: high school.     Social Determinants of  Health   Financial Resource Strain:   . Difficulty of Paying Living Expenses: Not on file  Food Insecurity:   . Worried About Programme researcher, broadcasting/film/video in the Last Year: Not on file  . Ran Out of Food in the Last Year: Not on file  Transportation Needs:   . Lack of Transportation (Medical): Not on file  . Lack of Transportation (Non-Medical): Not on file  Physical Activity:   . Days of Exercise per Week: Not on file  . Minutes of Exercise per Session: Not on file  Stress:   . Feeling of Stress : Not on file  Social Connections:   . Frequency of Communication with Friends and Family: Not on file  . Frequency of Social Gatherings with Friends and Family: Not on file  . Attends Religious Services: Not on file  . Active Member of Clubs or Organizations: Not on file  . Attends Banker Meetings: Not on file  . Marital Status: Not on  file  Intimate Partner Violence:   . Fear of Current or Ex-Partner: Not on file  . Emotionally Abused: Not on file  . Physically Abused: Not on file  . Sexually Abused: Not on file    Outpatient Medications Prior to Visit  Medication Sig Dispense Refill  . albuterol (PROVENTIL HFA;VENTOLIN HFA) 108 (90 Base) MCG/ACT inhaler Inhale 2 puffs into the lungs every 6 (six) hours as needed for wheezing or shortness of breath. 1 Inhaler 5  . allopurinol (ZYLOPRIM) 100 MG tablet Take 1 tablet (100 mg total) by mouth 2 (two) times daily. 60 tablet 5  . ammonium lactate (AMLACTIN) 12 % lotion Apply 1 application topically 2 (two) times daily. 400 g 4  . atorvastatin (LIPITOR) 40 MG tablet Take 1 tablet (40 mg total) by mouth daily. 90 tablet 3  . baclofen (LIORESAL) 10 MG tablet Take 20 mg by mouth at bedtime.    . Blood Glucose Monitoring Suppl (ACCU-CHEK AVIVA PLUS) w/Device KIT 1 each by Does not apply route 3 (three) times daily. 1 kit 0  . budesonide-formoterol (SYMBICORT) 80-4.5 MCG/ACT inhaler Take 2 puffs first thing in am and then another 2 puffs about 12 hours later. 1 Inhaler 11  . carvedilol (COREG) 3.125 MG tablet TAKE 1 TABLET BY MOUTH TWICE DAILY 60 tablet 5  . escitalopram (LEXAPRO) 20 MG tablet Take 1 tablet (20 mg total) by mouth daily. 90 tablet 1  . famotidine (PEPCID) 20 MG tablet One after supper 30 tablet 11  . fluticasone (FLONASE) 50 MCG/ACT nasal spray Place 2 sprays into both nostrils daily. As needed for nasal vongestion 16 g 6  . furosemide (LASIX) 40 MG tablet TAKE 1 TABLET TWICE DAILY 180 tablet 0  . glimepiride (AMARYL) 2 MG tablet Take 1 Tablet every morning after breakfast. 90 tablet 1  . glucose blood test strip Use one strip TID 100 each 12  . ketotifen (ALAWAY) 0.025 % ophthalmic solution Place 1 drop into both eyes 2 (two) times daily. 5 mL 0  . Lancets (ACCU-CHEK SOFT TOUCH) lancets Use one new lancet TID 100 each 12  . losartan (COZAAR) 25 MG tablet Take 1  tablet (25 mg total) by mouth daily. 30 tablet 11  . metFORMIN (GLUCOPHAGE-XR) 500 MG 24 hr tablet Take 2 tablets (1,000 mg total) by mouth daily. 180 tablet 1  . NARCAN 4 MG/0.1ML LIQD nasal spray kit NAR REP ALN  0  . nystatin-triamcinolone ointment (MYCOLOG) Apply 1 application topically 2 (two)  times daily. 30 g 0  . oxyCODONE-acetaminophen (PERCOCET/ROXICET) 5-325 MG tablet TK 1 T PO TID PRN  0  . pantoprazole (PROTONIX) 40 MG tablet Take 1 tablet (40 mg total) by mouth daily. Take 30-60 min before first meal of the day 30 tablet 2  . spironolactone (ALDACTONE) 25 MG tablet TAKE 1 TABLET(25 MG) BY MOUTH DAILY 30 tablet 5  . gabapentin (NEURONTIN) 300 MG capsule Take 1 capsule (300 mg total) by mouth 3 (three) times daily. 90 capsule 0  . hydrOXYzine (ATARAX/VISTARIL) 10 MG tablet Take 1 tablet (10 mg total) by mouth every 8 (eight) hours as needed for itching. 60 tablet 1  . ketoconazole (NIZORAL) 2 % cream Apply 1 application topically daily. 30 g 0  . gabapentin (NEURONTIN) 600 MG tablet Take 600 mg by mouth. (Patient not taking: Reported on 07/29/2020)    . clotrimazole-betamethasone (LOTRISONE) cream Apply 1 application topically 2 (two) times daily. 30 g 0   No facility-administered medications prior to visit.    Allergies  Allergen Reactions  . Ketoconazole Rash    ROS Review of Systems  Psychiatric/Behavioral: Positive for agitation and sleep disturbance. The patient is nervous/anxious.   All other systems reviewed and are negative.     Objective:    Physical Exam Vitals reviewed.  Constitutional:      Appearance: She is obese.  HENT:     Head: Normocephalic.     Nose: Nose normal.  Cardiovascular:     Rate and Rhythm: Normal rate and regular rhythm.  Pulmonary:     Effort: Pulmonary effort is normal.     Breath sounds: Normal breath sounds.  Abdominal:     General: Bowel sounds are normal. There is distension.  Musculoskeletal:     Cervical back: Normal range  of motion and neck supple.  Skin:    General: Skin is warm and dry.  Neurological:     Mental Status: She is alert and oriented to person, place, and time.  Psychiatric:        Mood and Affect: Mood normal.        Behavior: Behavior normal.        Thought Content: Thought content normal.        Judgment: Judgment normal.     BP (!) 146/94 (BP Location: Left Arm, Patient Position: Sitting, Cuff Size: Normal)   Pulse 81   Temp (!) 97.5 F (36.4 C) (Temporal)   Ht 5' (1.524 m)   Wt 185 lb 12.8 oz (84.3 kg)   LMP  (LMP Unknown)   SpO2 97%   BMI 36.29 kg/m  Wt Readings from Last 3 Encounters:  07/29/20 185 lb 12.8 oz (84.3 kg)  05/08/20 187 lb (84.8 kg)  12/26/19 197 lb (89.4 kg)     Health Maintenance Due  Topic Date Due  . COVID-19 Vaccine (1) Never done  . Fecal DNA (Cologuard)  Never done  . OPHTHALMOLOGY EXAM  10/05/2019  . INFLUENZA VACCINE  05/24/2020    There are no preventive care reminders to display for this patient.  Lab Results  Component Value Date   TSH 2.240 11/27/2018   Lab Results  Component Value Date   WBC 11.6 (H) 12/19/2018   HGB 12.0 12/19/2018   HCT 36.3 12/19/2018   MCV 91.1 12/19/2018   PLT 321.0 12/19/2018   Lab Results  Component Value Date   NA 142 09/03/2019   K 4.0 09/03/2019   CO2 29 09/03/2019   GLUCOSE 174 (  H) 09/03/2019   BUN 22 (H) 09/03/2019   CREATININE 1.18 (H) 09/03/2019   BILITOT 0.3 09/17/2018   ALKPHOS 142 (H) 09/19/2018   AST 20 09/17/2018   ALT 24 09/17/2018   PROT 7.3 09/17/2018   ALBUMIN 4.6 09/17/2018   CALCIUM 9.2 09/03/2019   ANIONGAP 10 09/03/2019   Lab Results  Component Value Date   CHOL 218 (H) 01/09/2018   Lab Results  Component Value Date   HDL 41 01/09/2018   Lab Results  Component Value Date   LDLCALC 152 (H) 01/09/2018   Lab Results  Component Value Date   TRIG 123 01/09/2018   Lab Results  Component Value Date   CHOLHDL 5.3 (H) 01/09/2018   Lab Results  Component Value Date    HGBA1C 6.3 (A) 05/08/2020      Assessment & Plan:  .Teara was seen today for medication effectiveness.  Diagnoses and all orders for this visit:  Depression with anxiety   Office Visit from 07/29/2020 in [facility]  PHQ-9 Total Score 23    -     Ambulatory referral to Psychiatry Discussed a one-time prescription for Xanax only take with extreme panic or anxiety attacks.  She is aware there will be no refills unless given by a therapist.  Essential hypertension, benign Counseled on blood pressure goal of less than 130/80, low-sodium, DASH diet, medication compliance,.  Stress can also elevate blood pressure with tobacco abuse.  Other orders -     ALPRAZolam (XANAX) 1 MG tablet; Take 1 tablet (1 mg total) by mouth 2 (two) times daily as needed for anxiety.     Meds ordered this encounter  Medications  . ALPRAZolam (XANAX) 1 MG tablet    Sig: Take 1 tablet (1 mg total) by mouth 2 (two) times daily as needed for anxiety.    Dispense:  20 tablet    Refill:  0    Follow-up: Return for keep schedule appt.    Grayce Sessions, NP

## 2020-08-10 ENCOUNTER — Ambulatory Visit (INDEPENDENT_AMBULATORY_CARE_PROVIDER_SITE_OTHER): Payer: Medicare HMO | Admitting: Primary Care

## 2020-09-10 ENCOUNTER — Ambulatory Visit (INDEPENDENT_AMBULATORY_CARE_PROVIDER_SITE_OTHER): Payer: Medicare HMO | Admitting: Primary Care

## 2020-09-14 ENCOUNTER — Encounter (HOSPITAL_COMMUNITY): Payer: Medicare HMO

## 2020-09-14 ENCOUNTER — Telehealth (HOSPITAL_COMMUNITY): Payer: Self-pay | Admitting: Cardiology

## 2020-09-14 NOTE — Telephone Encounter (Signed)
Attempting to contact patient for appointment reminder No answer-unable to leave voicemail

## 2020-09-22 ENCOUNTER — Ambulatory Visit (INDEPENDENT_AMBULATORY_CARE_PROVIDER_SITE_OTHER): Payer: Medicare HMO | Admitting: Primary Care

## 2020-11-27 ENCOUNTER — Other Ambulatory Visit (INDEPENDENT_AMBULATORY_CARE_PROVIDER_SITE_OTHER): Payer: Self-pay | Admitting: Family Medicine

## 2020-12-10 ENCOUNTER — Other Ambulatory Visit: Payer: Self-pay | Admitting: Internal Medicine

## 2020-12-10 MED ORDER — BUDESONIDE-FORMOTEROL FUMARATE 80-4.5 MCG/ACT IN AERO: INHALATION_SPRAY | RESPIRATORY_TRACT | 0 refills | Status: DC

## 2020-12-17 ENCOUNTER — Telehealth (INDEPENDENT_AMBULATORY_CARE_PROVIDER_SITE_OTHER): Payer: Self-pay | Admitting: Primary Care

## 2020-12-17 NOTE — Telephone Encounter (Signed)
Faxed on feb 1st Incontinence supplies, pull ups and under pads.   [employer] [facility], [place] contact: [phone]  Faxing again today

## 2020-12-23 NOTE — Telephone Encounter (Signed)
2 forms, the [place] request is missing questions 19 and 20. Need to check a box on both of those lines  Will need to initial and date that modification   Best contact: Gerald Stabs, [phone]

## 2021-01-01 ENCOUNTER — Other Ambulatory Visit (INDEPENDENT_AMBULATORY_CARE_PROVIDER_SITE_OTHER): Payer: Self-pay | Admitting: Primary Care

## 2021-01-01 NOTE — Telephone Encounter (Signed)
Requested medications are due for refill today Yes, was given 90 but takes twice a day  Requested medications are on the active medication list yes  Last refill 2/7   Future visit scheduled NO  Notes to clinic Has already had a curtesy refill and there is no upcoming appointment scheduled.

## 2021-01-06 NOTE — Telephone Encounter (Signed)
Forms will be faxed back to office as the original forms were not legible.

## 2021-01-07 ENCOUNTER — Telehealth (INDEPENDENT_AMBULATORY_CARE_PROVIDER_SITE_OTHER): Payer: Self-pay | Admitting: Primary Care

## 2021-01-07 NOTE — Telephone Encounter (Signed)
Called to ask if the doctor was able to complete the incontinence for that as sent to the office.  Please advise and call back to discuss at [phone]

## 2021-01-07 NOTE — Telephone Encounter (Signed)
Please follow up

## 2021-01-11 NOTE — Telephone Encounter (Signed)
[facility] forms were to be faxed back to office. CMA has been out of office since last Thursday. Will check upon return on 3/22. Forms will still need to be signed by PCP.

## 2021-01-29 ENCOUNTER — Other Ambulatory Visit (INDEPENDENT_AMBULATORY_CARE_PROVIDER_SITE_OTHER): Payer: Self-pay | Admitting: Primary Care

## 2021-01-29 NOTE — Telephone Encounter (Signed)
Requested medications are due for refill today.  yes  Requested medications are on the active medications list. yes  Last refill. 01/06/2021  Future visit scheduled.   no  Notes to clinic.  Courtesy refill already given.

## 2021-02-08 ENCOUNTER — Other Ambulatory Visit: Payer: Self-pay | Admitting: Internal Medicine

## 2021-02-11 ENCOUNTER — Ambulatory Visit (INDEPENDENT_AMBULATORY_CARE_PROVIDER_SITE_OTHER): Payer: Medicare HMO | Admitting: Primary Care

## 2021-02-11 ENCOUNTER — Encounter (INDEPENDENT_AMBULATORY_CARE_PROVIDER_SITE_OTHER): Payer: Self-pay | Admitting: Primary Care

## 2021-02-11 ENCOUNTER — Other Ambulatory Visit: Payer: Self-pay

## 2021-02-11 VITALS — BP 105/74 | HR 90 | Temp 97.3°F | Ht 60.0 in | Wt 175.2 lb

## 2021-02-11 DIAGNOSIS — E119 Type 2 diabetes mellitus without complications: Secondary | ICD-10-CM

## 2021-02-11 DIAGNOSIS — J9601 Acute respiratory failure with hypoxia: Secondary | ICD-10-CM

## 2021-02-11 DIAGNOSIS — F338 Other recurrent depressive disorders: Secondary | ICD-10-CM | POA: Insufficient documentation

## 2021-02-11 DIAGNOSIS — J309 Allergic rhinitis, unspecified: Secondary | ICD-10-CM | POA: Diagnosis not present

## 2021-02-11 DIAGNOSIS — I5022 Chronic systolic (congestive) heart failure: Secondary | ICD-10-CM

## 2021-02-11 DIAGNOSIS — Z760 Encounter for issue of repeat prescription: Secondary | ICD-10-CM

## 2021-02-11 DIAGNOSIS — E7841 Elevated Lipoprotein(a): Secondary | ICD-10-CM

## 2021-02-11 LAB — POCT GLYCOSYLATED HEMOGLOBIN (HGB A1C): Hemoglobin A1C: 7 % — AB (ref 4.0–5.6)

## 2021-02-11 MED ORDER — METFORMIN HCL ER 500 MG PO TB24: ORAL_TABLET | ORAL | 1 refills | Status: DC

## 2021-02-11 MED ORDER — ATORVASTATIN CALCIUM 40 MG PO TABS: 40.0000 mg | ORAL_TABLET | Freq: Every day | ORAL | 3 refills | Status: DC

## 2021-02-11 MED ORDER — ESCITALOPRAM OXALATE 20 MG PO TABS: 20.0000 mg | ORAL_TABLET | Freq: Every day | ORAL | 1 refills | Status: DC

## 2021-02-11 MED ORDER — LOSARTAN POTASSIUM 25 MG PO TABS: 25.0000 mg | ORAL_TABLET | Freq: Every day | ORAL | 1 refills | Status: DC

## 2021-02-11 MED ORDER — GLIMEPIRIDE 2 MG PO TABS: ORAL_TABLET | ORAL | 1 refills | Status: DC

## 2021-02-11 MED ORDER — FLUTICASONE PROPIONATE 50 MCG/ACT NA SUSP: 2.0000 | Freq: Every day | NASAL | 6 refills | Status: DC

## 2021-02-11 NOTE — Progress Notes (Signed)
  Subjective:     [name] is a 55 y.o.obese  female who presents for follow up of diabetes.. Current symptoms include: increase appetite, nausea, polyuria, visual disturbances and vomitting. Patient denies foot ulcerations, hypoglycemia  and weight loss. Evaluation to date has been: hemoglobin A1C. Home sugars: patient does not check sugars. Current treatments: more intensive attention to diet which has been somewhat effective, low cholesterol diet which has been somewhat effective, Continued metformin which has been effective, Continued statin which has been effective and Continued ACE inhibitor/ARB which has been effective. Last dilated eye exam to be schedule   The following portions of the patient's history were reviewed and updated as appropriate: past family history, past medical history, past social history, past surgical history and problem list.  Review of Systems Gastrointestinal: positive for abdominal pain and change in bowel habits Musculoskeletal:positive for bilateral knee pain     Objective:  BP 105/74 (BP Location: Left Arm, Patient Position: Sitting, Cuff Size: Large)   Pulse 90   Temp (!) 97.3 F (36.3 C) (Temporal)   Ht 5' (1.524 m)   Wt 175 lb 3.2 oz (79.5 kg)   LMP  (LMP Unknown)   SpO2 95%   BMI 34.22 kg/m   Patient was not evaluated for proper footwear and sizing.  Laboratory: No components found for: A1C 7.0   Assessment:    Diagnoses and all orders for this visit:  Type 2 diabetes mellitus without complication, without long-term current use of insulin (HCC) -     HgB A1c 7.0  Diabetes mellitus Type II, under good control.   Addressed ADA diet. Suggested low cholesterol diet. Discussed foot care. Amaryl and metformin; see  medication orders.  statin drug see medication orders. ACE inhibitor; see medication orders.   Stacey Lin was seen today for diabetes.  Allergic rhinitis, unspecified seasonality, unspecified trigger -     fluticasone  (FLONASE) 50 MCG/ACT nasal spray; Place 2 sprays into both nostrils daily. As needed for nasal vongestion  Elevated lipoprotein(a) Decrease your fatty foods, red meat, cheese, milk and increase fiber like whole grains and veggies. -     atorvastatin (LIPITOR) 40 MG tablet; Take 1 tablet (40 mg total) by mouth daily.  Chronic systolic HF (heart failure) ([place]) Followed by cardiology   Other recurrent depressive disorders ([place]) [place] Office Visit from 02/11/2021 in [facility]  PHQ-9 Total Score 23    Prescribed lexapro 20mg  at bedtime  Acute respiratory failure with hypoxia ([place]) Follow by pulmonary   Morbid obesity due to excess calories ([place]) Obesity is 30-39 indicating an excess in caloric intake or underlining conditions. This may lead to other co-morbidities. Lifestyle modifications of diet and exercise may reduce obesity.   Other orders -     losartan (COZAAR) 25 MG tablet; Take 1 tablet (25 mg total) by mouth daily. -     escitalopram (LEXAPRO) 20 MG tablet; Take 1 tablet (20 mg total) by mouth daily. -     glimepiride (AMARYL) 2 MG tablet; Take 1 Tablet every morning after breakfast. -     metFORMIN (GLUCOPHAGE-XR) 500 MG 24 hr tablet; TAKE 1 TABLETS TWICE A DAY  [name]

## 2021-02-17 ENCOUNTER — Telehealth (INDEPENDENT_AMBULATORY_CARE_PROVIDER_SITE_OTHER): Payer: Self-pay

## 2021-02-17 NOTE — Telephone Encounter (Signed)
Please contact. Nat Christen, CMA    Copied from [facility] [id number]. Topic: General - Other >> Feb 15, 2021 12:40 PM Yvette Rack wrote: Reason for CRM: Gerald Stabs with [employer] requests call back at [phone]

## 2021-02-18 ENCOUNTER — Other Ambulatory Visit (INDEPENDENT_AMBULATORY_CARE_PROVIDER_SITE_OTHER): Payer: Self-pay | Admitting: Primary Care

## 2021-02-19 ENCOUNTER — Ambulatory Visit (INDEPENDENT_AMBULATORY_CARE_PROVIDER_SITE_OTHER): Payer: Medicare HMO | Admitting: Podiatrist

## 2021-02-19 ENCOUNTER — Encounter: Payer: Self-pay | Admitting: Podiatrist

## 2021-02-19 ENCOUNTER — Other Ambulatory Visit: Payer: Self-pay

## 2021-02-19 DIAGNOSIS — L84 Corns and callosities: Secondary | ICD-10-CM | POA: Diagnosis not present

## 2021-02-19 DIAGNOSIS — M79674 Pain in right toe(s): Secondary | ICD-10-CM | POA: Diagnosis not present

## 2021-02-19 DIAGNOSIS — E119 Type 2 diabetes mellitus without complications: Secondary | ICD-10-CM

## 2021-02-19 DIAGNOSIS — B351 Tinea unguium: Secondary | ICD-10-CM

## 2021-02-19 DIAGNOSIS — M79675 Pain in left toe(s): Secondary | ICD-10-CM | POA: Diagnosis not present

## 2021-02-19 NOTE — Telephone Encounter (Signed)
Called number hit number advised for orders call dropped - called again same

## 2021-02-19 NOTE — Progress Notes (Signed)
Chief Complaint  Patient presents with  . Callouses  . Nail Problem    Thick/painful toenails      HPI: Patient is 55 y.o. female who presents today for care of painful thickened toenails of both feet.  She states she gets cramping at night in her feet and legs.  She also has thick skin of the plantar and posterior heels left greater than right.  Allergies  Allergen Reactions  . Ketoconazole Rash    Review of systems is reviewed and negative.   Physical Exam  Patient is awake, alert, and oriented x 3.  In no acute distress.    Vascular Examination: Neurovascular status intact b/l lower extremities. Capillary refill time to digits immediate b/l. Palpable pedal pulses b/l LE. Pedal hair absent. Lower extremity skin temperature gradient within normal limits. No pain with calf compression b/l. No edema noted b/l lower extremities.   Dermatological Examination: Pedal skin with normal turgor, texture and tone bilaterally. No open wounds bilaterally. No interdigital macerations bilaterally. Toenails 1-5 b/l elongated, discolored, dystrophic, thickened, crumbly with subungual debris and tenderness to dorsal palpation. Hyperkeratotic lesion(s) R hallux, and plantar aspect of heels bilateral .  No erythema, no edema, no drainage, no flocculence.   Musculoskeletal: Normal muscle strength 5/5 to all lower extremity muscle groups bilaterally. No pain crepitus or joint limitation noted with ROM b/l. No gross bony deformities bilaterally.   Neurological Examination: Protective sensation intact 5/5 intact bilaterally with 10g monofilament b/l. Proprioception intact bilaterally   Assessment:   ICD-10-CM   1. Pain due to onychomycosis of toenails of both feet  B35.1    M79.675    M79.674   2. Controlled type 2 diabetes mellitus without complication, without long-term current use of insulin (HCC)  E11.9   3. Callus of heel  L84   4. Corn or callus  L84      Plan:  -Examined the patient and  discussed findings with the patient. -Recommended trying tonic water at night for her night cramping. -I debrided the nails in thickness in length with manual and mechanical means.  Patient tolerated this well and no iatrogenic incident occurred. -I pared the callus tissue with a #15 blade without complication right medial hallux and left heel with a total of 2 debrided. -Patient will return in 3 months for continued care if any concerns arise prior to that visit she will call.

## 2021-03-04 ENCOUNTER — Ambulatory Visit: Payer: Medicare HMO | Admitting: Internal Medicine

## 2021-03-04 NOTE — Progress Notes (Deleted)
   Christinia Gully, MD

## 2021-03-05 ENCOUNTER — Ambulatory Visit (INDEPENDENT_AMBULATORY_CARE_PROVIDER_SITE_OTHER): Payer: Medicare HMO

## 2021-03-05 ENCOUNTER — Ambulatory Visit (INDEPENDENT_AMBULATORY_CARE_PROVIDER_SITE_OTHER): Payer: Medicare HMO | Admitting: Internal Medicine

## 2021-03-05 ENCOUNTER — Other Ambulatory Visit: Payer: Self-pay

## 2021-03-05 ENCOUNTER — Encounter: Payer: Self-pay | Admitting: Internal Medicine

## 2021-03-05 DIAGNOSIS — J4541 Moderate persistent asthma with (acute) exacerbation: Secondary | ICD-10-CM

## 2021-03-05 DIAGNOSIS — F17210 Nicotine dependence, cigarettes, uncomplicated: Secondary | ICD-10-CM

## 2021-03-05 DIAGNOSIS — Z760 Encounter for issue of repeat prescription: Secondary | ICD-10-CM | POA: Diagnosis not present

## 2021-03-05 DIAGNOSIS — R058 Other specified cough: Secondary | ICD-10-CM

## 2021-03-05 MED ORDER — PANTOPRAZOLE SODIUM 40 MG PO TBEC: 40.0000 mg | DELAYED_RELEASE_TABLET | Freq: Every day | ORAL | 2 refills | Status: DC

## 2021-03-05 MED ORDER — ALBUTEROL SULFATE HFA 108 (90 BASE) MCG/ACT IN AERS: INHALATION_SPRAY | RESPIRATORY_TRACT | Status: DC

## 2021-03-05 MED ORDER — BREZTRI AEROSPHERE 160-9-4.8 MCG/ACT IN AERO: 2.0000 | INHALATION_SPRAY | Freq: Two times a day (BID) | RESPIRATORY_TRACT | 0 refills | Status: DC

## 2021-03-05 MED ORDER — FAMOTIDINE 20 MG PO TABS: ORAL_TABLET | ORAL | 11 refills | Status: DC

## 2021-03-05 MED ORDER — METHYLPREDNISOLONE ACETATE 80 MG/ML IJ SUSP
80.0000 mg | Freq: Once | INTRAMUSCULAR | Status: AC
  Administered 2021-03-05: 80 mg via INTRAMUSCULAR

## 2021-03-05 MED ORDER — ALBUTEROL SULFATE HFA 108 (90 BASE) MCG/ACT IN AERS
2.0000 | INHALATION_SPRAY | Freq: Four times a day (QID) | RESPIRATORY_TRACT | 5 refills | Status: DC | PRN
Start: 2021-03-05 — End: 2022-05-19

## 2021-03-05 NOTE — Patient Instructions (Addendum)
The key is to stop smoking completely before smoking completely stops you!  Depomedrol 80 mg IM   Stop symbicort  and start Breztri one twice daily   Work on inhaler technique:  relax and gently blow all the way out then take a nice smooth deep breath back in, triggering the inhaler at same time you start breathing in.  Hold for up to 5 seconds if you can. Blow out thru nose. Rinse and gargle with water when done  Use empty symbicort container (remember how golfers always take practice swings) before using Breztri  Only use your albuterol as a rescue medication to be used if you can't catch your breath by resting or doing a relaxed purse lip breathing pattern.  - The less you use it, the better it will work when you need it. - Ok to use up to 2 puffs  every 4 hours if you must but call for immediate appointment if use goes up over your usual need - Don't leave home without it !!  (think of it like the spare tire for your car)   Pantoprazole (protonix) 40 mg   Take  30-60 min before first meal of the day and Pepcid (famotidine)  20 mg after supper until return to office - this is the best way to tell whether stomach acid is contributing to your problem.    GERD (REFLUX)  is an extremely common cause of respiratory symptoms just like yours , many times with no obvious heartburn at all.    It can be treated with medication, but also with lifestyle changes including elevation of the head of your bed (ideally with 6 -8inch blocks under the headboard of your bed),  Smoking cessation, avoidance of late meals, excessive alcohol, and avoid fatty foods, chocolate, peppermint, colas, red wine, and acidic juices such as orange juice.  NO MINT OR MENTHOL PRODUCTS SO NO COUGH DROPS  USE SUGARLESS CANDY INSTEAD (Jolley ranchers or Stover's or Life Savers) or even ice chips will also do - the key is to swallow to prevent all throat clearing. NO OIL BASED VITAMINS - use powdered substitutes.  Avoid fish oil  when coughing.    Please remember to go to the  x-ray department  for your tests - we will call you with the results when they are available   Call your eye doctor right away or go the ER for eye pain   Get your 3rd covid 19 vaccine as soon as possible  Please schedule a follow up office visit in 4 weeks, sooner if needed  with all medications /inhalers/ solutions in hand so we can verify exactly what you are taking. This includes all medications from all doctors and over the counters

## 2021-03-05 NOTE — Assessment & Plan Note (Signed)
Quit smoking 11//2020  PFTs 10/2013:  FEV1 0.86 (41%), ratio 67, TLC 58%, unable to do DLCO Arlyce Harman as part of CPST 02/2014:  FEV1 1.19 (59%), ratio 72 Spiro 11/2014:  FEV1 1.08 (54%), ratio 80 - Spirometry 04/14/2016  FEV1 1.32 (68%)  Ratio 79 with nl effort indep portion of f/v loop  11/20/2018   resume symb 80 2bid x 2 week sample plus prednisone  X 6 days  then regroup with all meds in hand > did not do and as of 12/04/2018 no prednisone taken/still smoking  - Allergy profile 12/19/2018 >  Eos 0. /  IgE 8 rast neg  - 03/05/2021  After extensive coaching inhaler device,  effectiveness =    25% at baseline > try breztri sample one bid x 4 week samples then ov with all meds / depomedrol 80 mg IM  And prn saba / max gerd rx  Having trouble affording symbiort and clear exam despite flare of cough so rec trial of free breztri one bid and prn saba  Re saba: I spent extra time with pt today reviewing appropriate use of albuterol for prn use on exertion with the following points: 1) saba is for relief of sob that does not improve by walking a slower pace or resting but rather if the pt does not improve after trying this first. 2) If the pt is convinced, as many are, that saba helps recover from activity faster then it's easy to tell if this is the case by re-challenging : ie stop, take the inhaler, then p 5 minutes try the exact same activity (intensity of workload) that just caused the symptoms and see if they are substantially diminished or not after saba 3) if there is an activity that reproducibly causes the symptoms, try the saba 15 min before the activity on alternate days   If in fact the saba really does help, then fine to continue to use it prn but advised may need to look closer at the maintenance regimen being used to achieve better control of airways disease with exertion.

## 2021-03-05 NOTE — Assessment & Plan Note (Signed)
4-5 min discussion re active cigarette smoking in addition to office E&M  Ask about tobacco use:   ongoing Advise quitting   I took an extended  opportunity with this patient to outline the consequences of continued cigarette use  in airway disorders based on all the data we have from the multiple national lung health studies (perfomed over decades at millions of dollars in cost)  indicating that smoking cessation, not choice of inhalers or physicians, is the most important aspect of care.   Assess willingness:  Not committed at this point Assist in quit attempt:  Per PCP when ready Arrange follow up:   Follow up per Primary Care planned        

## 2021-03-05 NOTE — Progress Notes (Signed)
Subjective:    Patient ID: Stacey Lin, female    DOB: 10/05/1966   MRN: [id number]    Brief patient profile:  70 yobf reports quit smoking 08/2019  previously seen by Dr. Shelle Iron 12/18/14 with dyspnea on exertion.She was felt to have asthmatic bronchitis but not COPD  rec Stop spiriva rx breo 100, take one inhalation each am proair respiclick, 2 inhalations every 6 hrs only if you are having severe breathing issues. Work on weight loss and conditioning. Will send a note to your cardiologist to see about getting you off lisinopril to see if this helps your dry tickling cough. You must stop smoking 100% in order to stay well.  followup with me again in 2mos to check on things> did not return.   History of Present Illness  02/12/2016: NP Follow up Office Visit: Patient presents to the office today with worsening dyspnea on exertion. She was seen at the heart failure clinic one day prior to OV   and they requested that she follow up with pulmonary. Spirometry 11/2014 showed  normal FEV1 with ? air trapping given her   FVC ? 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 She is continuing to smoke approx. 2 cigarettes daily per her history.    she has not been using her Breo inhaler daily as  Maintenance medication, but just when needed.   rec We will give you a Breo sample and new prescription for your Breo.( Maintenance inhaler) Use the Breo 1 puff twice daily. We will give you a prescription for your Pro Air Inhaler ( Rescue Inhaler) Use this every 6 hours as needed for SOB or Wheezing. Claritin ( loratadine) 1 every day for allergies Nasal Saline as needed for nasal congestion Continue weighing yourself daily per the heart failure clinic Follow up in 1 month > did  Not return        11/20/2018  Acute  ov/Melquisedec Journey re: worse since stopped symbicort  Chief Complaint  Patient presents with  . Acute Visit    Increased SOB for the past 2 months. She gets winded walking from room to room at  home. She also c/o occ cough and wheezing. Her cough is mainly non prod.  She is using her albuterol inhaler 2 x per wk on average.   Dyspnea:  Gradually worse x 2 months to point to doe  Room to room = MMRC3  Cough: acutely worse x 2 days / harsh esp daytime > noct  Sleeping: on back with 2 pillows SABA use: confused with which ones help and when to take  rec Plan A = Automatic = symbicort 80 Take 2 puffs first thing in am and then another 2 puffs about 12 hours later.  Work on inhaler technique  The key is to stop smoking completely before smoking completely stops you!  Plan B = Backup Only use your albuterol (ventolin) inhaler  Please schedule a follow up office visit in 2  weeks, call sooner if needed with all medications /inhalers/ solutions in hand so we can verify exactly what you are taking. This includes all medications from all doctors and over the counters - PLEASE separate them into two bags:  the ones you take automatically, no matter what, vs the ones you take just when you feel you need them "BAG #2 is UP TO YOU"  - this will really help [place] help you take your medications more effectively.  - add: Prednisone 10 mg take  4 each am x  2 days,   2 each am x 2 days,  1 each am x 2 days and stop and spirometry on return     12/04/2018  f/u ov/Mykelti Goldenstein re:  AB / non adherence, still smoking  Chief Complaint  Patient presents with  . Follow-up    Breathing is unchanged. She never started the pred taper b/c she did not understand the instructions. She has not had to use her albuterol inhaler. She has had occ cough and wheezing at night.   Dyspnea:  MMRC3 = can't walk 100 yards even at a slow pace at a flat grade s stopping due to sob   Cough: daytime / not productive  Sleeping: ok after ambien flat bed 2 pillows  SABA use: not using as long as on symbbicort  02: none  rec  Prednisone 10 mg take  4 each am x 2 days,   2 each am x 2 days,  1 each am x 2 days and stop  Work on inhaler  technique:   Plan A = Automatic = Symbicort 80 Take 2 puffs first thing in am and then another 2 puffs about 12 hours later.  Plan B = Backup Only use your albuterol inhaler as a rescue medication Please schedule a follow up office visit in 2 weeks, sooner if needed  with all medications /inhalers/ solutions in hand so we can verify exactly what you are taking. This includes all medications from all doctors and over the counters     12/19/2018  f/u ov/Lorain Fettes re: cough x 3 months / maint on symbicort 80 2bid / still smoking Chief Complaint  Patient presents with  . Follow-up    Breathing is doing some better and she is coughing less. She has not had to use her albuterol inhaler.   Dyspnea:  Can't do [employer] due to sob and  R knee pain  Cough: more day than noct and dry Sleeping: bed is flat/ 2 pillows  SABA : not using at present  02: no Using lots of mints  rec Stop smoking if at all possible Brush your tongue with [employer] toothpaste and garlge with the slurry Prednisone 10 mg take  4 each am x 2 days,   2 each am x 2 days,  1 each am x 2 days and stop to see if helps  As needed > tessalon 200 mg every 6 hours as needed  GERD diet Please remember to go to the lab department   for your tests - we will call you with the results when they are available.    Please schedule a follow up office visit in 6 weeks, call sooner if needed with PFTs on return       08/16/2019  f/u ov/Ani Deoliveira re: still smoking /never better fro last eval  Chief Complaint  Patient presents with  . Follow-up    C10: asthmatic bronchitis pt unable to perform PFT due to cough and congestion today.Pt has been coughing clear mucus for the past 3 days. She had covid testin10/20 and was not feeling well.  . Nasal Congestion    pt states cough is making her feel nauseous. Pt has not have fever, chills or bodyaches. Pt says today she has sore throat.  Dyspnea:  Room to room  Cough: clear mucus  Sleeping: 45  degrees on pillows x years  SABA use: helps 02: rec Work on inhaler technique:  relax and gently blow all the way out then  take a nice smooth deep breath back in, triggering the inhaler at same time you start breathing in.  Hold for up to 5 seconds if you can. Blow symbicort out thru nose. Rinse and gargle with water when done Only use your albuterol as a rescue medication   Stop all mints  zpak  Prednisone 10 mg take  4 each am x 2 days,   2 each am x 2 days,  1 each am x 2 days and stop  Increase gabapentin to 100 mg 4 x daily -bfast lunch supper and bedtime  The key is to stop smoking completely before smoking completely stops you!  Please schedule a follow up office visit in 2 weeks, sooner if needed  with all medications /inhalers/ solutions in hand so we can verify exactly what you are taking. This includes all medications from all doctors and over the counters - needs cxr on return > did not do  - add prilosec 20 mg bid ac > did not do   09/02/2019  f/u ov/Rojean Ige re: uacs onset 08/2018 on entresto  - did not bring meds as req  Chief Complaint  Patient presents with  . Follow-up    Cough is slightly better. She never started prilosec.   Dyspnea:  Room to room Cough: daytime and dry Sleeping: on side 2 pillows  SABA use: not helping  02: none  rec Pantoprazole (protonix) 40 mg   Take  30-60 min before first meal of the day and Pepcid (famotidine)  20 mg one @  bedtime until return to office - this is the best way to tell whether stomach acid is contributing to your problem.   Change gabapentin to 300 mg three times daily  You may have to stop the [facility] and start a substitute but I will defer that to Dr Ricarda Frame works for cough but it is a last resort   televist  note 12/23/19  Stop all smoking  Symbicort 80 Take 2 puffs first thing in am and then another 2 puffs about 12 hours later.  Only use your albuterol (BLUE ventolin)  as a rescue medication  Always take your  medications with you to see your doctors (including me) Based on the nature of your cough I recommend a trial off entresto for up to 6 week > did not do  If your heartcare providers do not agree with a substitute for your entresto I will ask them to send you to another pulmonary specialist as I can't help solve this problem without doing this step first.     03/05/2021  f/u ov/Faustina Gebert re: resumed smoking/ did not bring meds  ? Better cough  off entresto  Chief Complaint  Patient presents with  . Acute Visit    Pt c/o increased SOB past few months- she gets winded walking room to room at home. She is coughing and wheezing. Cough is prod with white sputum.   Dyspnea:  Room to room gives out  Cough: sporadic but better than used to be on entresto, worse off gerd rx Sleeping: on side / bed is flat  SABA use: not using symbicort regularly   02: none Covid status:   vax x 2    No obvious day to day or daytime variability or assoc   purulent sputum or mucus plugs or hemoptysis or cp or chest tightness, subjective wheeze or overt sinus or hb symptoms.    . Also denies any obvious fluctuation of symptoms with  weather or environmental changes or other aggravating or alleviating factors except as outlined above   No unusual exposure hx or h/o childhood pna/ asthma or knowledge of premature birth.  Current Allergies, Complete Past Medical History, Past Surgical History, Family History, and Social History were reviewed in [employer] record.  ROS  The following are not active complaints unless bolded Hoarseness, sore throat, dysphagia, dental problems, itching, sneezing,  nasal congestion or discharge of excess mucus or purulent secretions, ear ache,   fever, chills, sweats, unintended wt loss or wt gain, classically pleuritic or exertional cp,  orthopnea pnd or arm/hand swelling  or leg swelling, presyncope, palpitations, abdominal pain, anorexia, nausea, vomiting, diarrhea  or  change in bowel habits or change in bladder habits, change in stools or change in urine, dysuria, hematuria,  rash, arthralgias, visual complaints p insect flew into R eye 12 h prior to OV  , headache, numbness, weakness or ataxia or problems with walking or coordination,  change in mood or  memory.        Current Meds - - NOTE:   Unable to verify as accurately reflecting what pt takes     Medication Sig  . acetaminophen (TYLENOL) 500 MG tablet Take 500 mg by mouth every 6 (six) hours as needed.  Marland Kitchen albuterol (PROVENTIL HFA;VENTOLIN HFA) 108 (90 Base) MCG/ACT inhaler Inhale 2 puffs into the lungs every 6 (six) hours as needed for wheezing or shortness of breath.  . allopurinol (ZYLOPRIM) 100 MG tablet Take 1 tablet (100 mg total) by mouth 2 (two) times daily.  Marland Kitchen ammonium lactate (AMLACTIN) 12 % lotion Apply 1 application topically 2 (two) times daily.  Marland Kitchen atorvastatin (LIPITOR) 40 MG tablet Take 1 tablet (40 mg total) by mouth daily.  . Blood Glucose Monitoring Suppl (ACCU-CHEK AVIVA PLUS) w/Device KIT 1 each by Does not apply route 3 (three) times daily.  . carvedilol (COREG) 3.125 MG tablet TAKE 1 TABLET BY MOUTH TWICE DAILY  . escitalopram (LEXAPRO) 20 MG tablet Take 1 tablet (20 mg total) by mouth daily.  . fluticasone (FLONASE) 50 MCG/ACT nasal spray Place 2 sprays into both nostrils daily. As needed for nasal vongestion  . furosemide (LASIX) 40 MG tablet TAKE 1 TABLET TWICE DAILY  . gabapentin (NEURONTIN) 600 MG tablet Take 600 mg by mouth.  Marland Kitchen glimepiride (AMARYL) 2 MG tablet Take 1 Tablet every morning after breakfast.  . glucose blood test strip Use one strip TID  . ketotifen (ALAWAY) 0.025 % ophthalmic solution Place 1 drop into both eyes 2 (two) times daily.  . Lancets (ACCU-CHEK SOFT TOUCH) lancets Use one new lancet TID  . losartan (COZAAR) 25 MG tablet Take 1 tablet (25 mg total) by mouth daily.  . metFORMIN (GLUCOPHAGE-XR) 500 MG 24 hr tablet TAKE 1 TABLETS TWICE A DAY  . NARCAN 4  MG/0.1ML LIQD nasal spray kit NAR REP ALN  . nystatin-triamcinolone ointment (MYCOLOG) Apply 1 application topically 2 (two) times daily.  Marland Kitchen spironolactone (ALDACTONE) 25 MG tablet TAKE 1 TABLET(25 MG) BY MOUTH DAILY                  Objective:   Physical Exam     03/05/2021        173 09/02/2019        200  08/16/2019      192 12/19/2018        202  12/04/2018        205   11/20/18 201 lb  6.4 oz (91.4 kg)  10/30/18 197 lb (89.4 kg)  09/17/18 198 lb (89.8 kg)       Vital signs reviewed  03/05/2021  - Note at rest 02 sats  94% on RA   General appearance:    Hoarse amb obese  bf dry coughing fits  Squinting due to R eye pain from insect injury (see avs)      Reports edentulous   HEENT : pt wearing mask not removed for exam due to covid -19 concerns.    NECK :  without JVD/Nodes/TM/ nl carotid upstrokes bilaterally   LUNGS: no acc muscle use,  Nl contour chest which is clear to A and P bilaterally without cough on insp or exp maneuvers   CV:  RRR  no s3 or murmur or increase in P2, and no edema   ABD:  Obese soft and nontender with nl inspiratory excursion in the supine position. No bruits or organomegaly appreciated, bowel sounds nl  MS:  Nl gait/ ext warm without deformities, calf tenderness, cyanosis or clubbing No obvious joint restrictions   SKIN: warm and dry without lesions    NEURO:  alert, approp, nl sensorium with  no motor or cerebellar deficits apparent.       CXR PA and Lateral:   03/05/2021 :    I personally reviewed images and agree with radiology impression as follows:    No active cardiopulmonary disease.             Assessment & Plan:

## 2021-03-05 NOTE — Assessment & Plan Note (Addendum)
Onset Nov 2019 while on entresto - Allergy profile 12/19/2018 >  Eos 0.5/  IgE  8  RAST neg  - d/c all mints 12/19/2018  And rx tessilon  - 08/16/19  gabapentin 100 qid > slt better 09/02/2019  - 09/02/2019 rec change gabapentin to 300 tid and consider stopping entresto> stopped ? 12/26/19  > improved - 03/05/2021 flare of cough off gerd rx > resumed gerd rx / depomedrol 80 mg IM   Upper airway cough syndrome (previously labeled PNDS),  is so named because it's frequently impossible to sort out how much is  CR/sinusitis with freq throat clearing (which can be related to primary GERD)   vs  causing  secondary (" extra esophageal")  GERD from wide swings in gastric pressure that occur with throat clearing, often  promoting self use of mint and menthol lozenges that reduce the lower esophageal sphincter tone and exacerbate the problem further in a cyclical fashion.   These are the same pts (now being labeled as having "irritable larynx syndrome" by some cough centers) who not infrequently have a history of having failed to tolerate ace inhibitors,  dry powder inhalers or biphosphonates or report having atypical/extraesophageal reflux symptoms that don't respond to standard doses of PPI  and are easily confused as having aecopd or asthma flares by even experienced allergists/ pulmonologists (myself included).  This flare likely due to gerd > restart rx plus bed blocks/diet  and f/u in 4 weeks           Each maintenance medication was reviewed in detail including emphasizing most importantly the difference between maintenance and prns and under what circumstances the prns are to be triggered using an action plan format where appropriate.  Total time for H and P, chart review, counseling, reviewing hfa device(s) and generating customized AVS unique to this acute office visit / same day charting  > 30 min

## 2021-03-08 ENCOUNTER — Encounter: Payer: Self-pay | Admitting: *Deleted

## 2021-03-11 ENCOUNTER — Encounter (HOSPITAL_COMMUNITY): Payer: Medicare HMO

## 2021-03-25 ENCOUNTER — Ambulatory Visit (INDEPENDENT_AMBULATORY_CARE_PROVIDER_SITE_OTHER): Payer: Medicare HMO | Admitting: Primary Care

## 2021-04-02 ENCOUNTER — Ambulatory Visit: Payer: Medicare HMO | Admitting: Internal Medicine

## 2021-04-02 NOTE — Progress Notes (Deleted)
Subjective:    Patient ID: Stacey Lin, female    DOB: 26-Jun-1966   MRN: [id number]    Brief patient profile:  72 yobf reports quit smoking 08/2019  previously seen by Dr. Shelle Iron 12/18/14 with dyspnea on exertion.She was felt to have asthmatic bronchitis but not COPD  rec Stop spiriva rx breo 100, take one inhalation each am proair respiclick, 2 inhalations every 6 hrs only if you are having severe breathing issues. Work on weight loss and conditioning. Will send a note to your cardiologist to see about getting you off lisinopril to see if this helps your dry tickling cough. You must stop smoking 100% in order to stay well.  followup with me again in 2mos to check on things> did not return.   History of Present Illness  02/12/2016: NP Follow up Office Visit: Patient presents to the office today with worsening dyspnea on exertion. She was seen at the heart failure clinic one day prior to OV   and they requested that she follow up with pulmonary. Spirometry 11/2014 showed  normal FEV1 with ? air trapping given her   FVC ? 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 She is continuing to smoke approx. 2 cigarettes daily per her history.    she has not been using her Breo inhaler daily as  Maintenance medication, but just when needed.   rec We will give you a Breo sample and new prescription for your Breo.( Maintenance inhaler) Use the Breo 1 puff twice daily. We will give you a prescription for your Pro Air Inhaler ( Rescue Inhaler) Use this every 6 hours as needed for SOB or Wheezing. Claritin ( loratadine) 1 every day for allergies Nasal Saline as needed for nasal congestion Continue weighing yourself daily per the heart failure clinic Follow up in 1 month > did  Not return        11/20/2018  Acute  ov/Caileen Veracruz re: worse since stopped symbicort  Chief Complaint  Patient presents with   Acute Visit    Increased SOB for the past 2 months. She gets winded walking from room to room at  home. She also c/o occ cough and wheezing. Her cough is mainly non prod.  She is using her albuterol inhaler 2 x per wk on average.   Dyspnea:  Gradually worse x 2 months to point to doe  Room to room = MMRC3  Cough: acutely worse x 2 days / harsh esp daytime > noct  Sleeping: on back with 2 pillows SABA use: confused with which ones help and when to take  rec Plan A = Automatic = symbicort 80 Take 2 puffs first thing in am and then another 2 puffs about 12 hours later.  Work on inhaler technique  The key is to stop smoking completely before smoking completely stops you!  Plan B = Backup Only use your albuterol (ventolin) inhaler  Please schedule a follow up office visit in 2  weeks, call sooner if needed with all medications /inhalers/ solutions in hand so we can verify exactly what you are taking. This includes all medications from all doctors and over the counters - PLEASE separate them into two bags:  the ones you take automatically, no matter what, vs the ones you take just when you feel you need them "BAG #2 is UP TO YOU"  - this will really help [place] help you take your medications more effectively.  - add: Prednisone 10 mg take  4 each am x  2 days,   2 each am x 2 days,  1 each am x 2 days and stop and spirometry on return     12/04/2018  f/u ov/Liberti Appleton re:  AB / non adherence, still smoking  Chief Complaint  Patient presents with   Follow-up    Breathing is unchanged. She never started the pred taper b/c she did not understand the instructions. She has not had to use her albuterol inhaler. She has had occ cough and wheezing at night.   Dyspnea:  MMRC3 = can't walk 100 yards even at a slow pace at a flat grade s stopping due to sob   Cough: daytime / not productive  Sleeping: ok after ambien flat bed 2 pillows  SABA use: not using as long as on symbbicort  02: none  rec  Prednisone 10 mg take  4 each am x 2 days,   2 each am x 2 days,  1 each am x 2 days and stop  Work on inhaler  technique:   Plan A = Automatic = Symbicort 80 Take 2 puffs first thing in am and then another 2 puffs about 12 hours later.  Plan B = Backup Only use your albuterol inhaler as a rescue medication Please schedule a follow up office visit in 2 weeks, sooner if needed  with all medications /inhalers/ solutions in hand so we can verify exactly what you are taking. This includes all medications from all doctors and over the counters     12/19/2018  f/u ov/Madalin Hughart re: cough x 3 months / maint on symbicort 80 2bid / still smoking Chief Complaint  Patient presents with   Follow-up    Breathing is doing some better and she is coughing less. She has not had to use her albuterol inhaler.   Dyspnea:  Can't do [employer] due to sob and  R knee pain  Cough: more day than noct and dry Sleeping: bed is flat/ 2 pillows  SABA : not using at present  02: no Using lots of mints  rec Stop smoking if at all possible Brush your tongue with [employer] toothpaste and garlge with the slurry Prednisone 10 mg take  4 each am x 2 days,   2 each am x 2 days,  1 each am x 2 days and stop to see if helps  As needed > tessalon 200 mg every 6 hours as needed  GERD diet Please remember to go to the lab department   for your tests - we will call you with the results when they are available.    Please schedule a follow up office visit in 6 weeks, call sooner if needed with PFTs on return       08/16/2019  f/u ov/Chelsye Suhre re: still smoking /never better fro last eval  Chief Complaint  Patient presents with   Follow-up    C10: asthmatic bronchitis pt unable to perform PFT due to cough and congestion today.Pt has been coughing clear mucus for the past 3 days. She had covid testin10/20 and was not feeling well.   Nasal Congestion    pt states cough is making her feel nauseous. Pt has not have fever, chills or bodyaches. Pt says today she has sore throat.  Dyspnea:  Room to room  Cough: clear mucus  Sleeping: 45 degrees  on pillows x years  SABA use: helps 02: rec Work on inhaler technique:  relax and gently blow all the way out then  take a nice smooth deep breath back in, triggering the inhaler at same time you start breathing in.  Hold for up to 5 seconds if you can. Blow symbicort out thru nose. Rinse and gargle with water when done Only use your albuterol as a rescue medication   Stop all mints  zpak  Prednisone 10 mg take  4 each am x 2 days,   2 each am x 2 days,  1 each am x 2 days and stop  Increase gabapentin to 100 mg 4 x daily -bfast lunch supper and bedtime  The key is to stop smoking completely before smoking completely stops you!  Please schedule a follow up office visit in 2 weeks, sooner if needed  with all medications /inhalers/ solutions in hand so we can verify exactly what you are taking. 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 She never started prilosec.   Dyspnea:  Room to room Cough: daytime and dry Sleeping: on side 2 pillows  SABA use: not helping  02: none  rec Pantoprazole (protonix) 40 mg   Take  30-60 min before first meal of the day and Pepcid (famotidine)  20 mg one @  bedtime until return to office - this is the best way to tell whether stomach acid is contributing to your problem.   Change gabapentin to 300 mg three times daily  You may have to stop the [facility] and start a substitute but I will defer that to Dr Ricarda Frame works for cough but it is a last resort   televist  note 12/23/19  Stop all smoking  Symbicort 80 Take 2 puffs first thing in am and then another 2 puffs about 12 hours later.  Only use your albuterol (BLUE ventolin)  as a rescue medication  Always take your medications  with you to see your doctors (including me) Based on the nature of your cough I recommend a trial off entresto for up to 6 week > did not do  If your heartcare providers do not agree with a substitute for your entresto I will ask them to send you to another pulmonary specialist as I can't help solve this problem without doing this step first.     03/05/2021  f/u ov/Shepard Keltz re: resumed smoking/ did not bring meds  ? Better cough  off entresto  Chief Complaint  Patient presents with   Acute Visit    Pt c/o increased SOB past few months- she gets winded walking room to room at home. She is coughing and wheezing. Cough is prod with white sputum.   Dyspnea:  Room to room gives out  Cough: sporadic but better than used to be on entresto, worse off gerd rx Sleeping: on side / bed is flat  SABA use: not using symbicort regularly   02: none Covid status:   vax x 2   Rec Stop smoking  Depomedrol 80 mg IM  Stop symbicort  and start Breztri one twice daily  Work on inhaler technique:   Use empty symbicort container (remember how golfers always take practice swings) before using Breztri Only use your albuterol as a  rescue medication Pantoprazole (protonix) 40 mg   Take  30-60 min before first meal of the day and Pepcid (famotidine)  20 mg after supper until return to office - this is the best way to tell whether stomach acid is contributing to your problem.   GERD diet/ lifestyle Get your 3rd covid 19 vaccine as soon as possible Please schedule a follow up office visit in 4 weeks, sooner if needed  with all medications  Cxr:  cm/ no active dz  04/02/2021  f/u ov/Ariella Voit re:  AB ? uacs  No chief complaint on file.   Dyspnea:  *** Cough: *** Sleeping: *** SABA use: *** 02: *** Covid status:   ***   No obvious day to day or daytime variability or assoc excess/ purulent sputum or mucus plugs or hemoptysis or cp or chest tightness, subjective wheeze or overt sinus or hb symptoms.  without nocturnal   or early am exacerbation  of respiratory  c/o's or need for noct saba. Also denies any obvious fluctuation of symptoms with weather or environmental changes or other aggravating or alleviating factors except as outlined above   No unusual exposure hx or h/o childhood pna/ asthma or knowledge of premature birth.  Current Allergies, Complete Past Medical History, Past Surgical History, Family History, and Social History were reviewed in [employer] record.  ROS  The following are not active complaints unless bolded Hoarseness, sore throat, dysphagia, dental problems, itching, sneezing,  nasal congestion or discharge of excess mucus or purulent secretions, ear ache,   fever, chills, sweats, unintended wt loss or wt gain, classically pleuritic or exertional cp,  orthopnea pnd or arm/hand swelling  or leg swelling, presyncope, palpitations, abdominal pain, anorexia, nausea, vomiting, diarrhea  or change in bowel habits or change in bladder habits, change in stools or change in urine, dysuria, hematuria,  rash, arthralgias, visual complaints, headache, numbness, weakness or ataxia or problems with walking or coordination,  change in mood or  memory.        No outpatient medications have been marked as taking for the 04/02/21 encounter (Appointment) with Nyoka Cowden, MD.         Objective:   Physical Exam    04/02/2021         *** 03/05/2021        173 09/02/2019        200  08/16/2019      192 12/19/2018        202  12/04/2018        205   11/20/18 201 lb 6.4 oz (91.4 kg)  10/30/18 197 lb (89.4 kg)  09/17/18 198 lb (89.8 kg)      Vital signs reviewed  04/02/2021  - Note at rest 02 sats  ***% on ***   General appearance:    ***    Reports edentulous                    Assessment & Plan:

## 2021-05-21 ENCOUNTER — Ambulatory Visit (INDEPENDENT_AMBULATORY_CARE_PROVIDER_SITE_OTHER): Payer: Medicare HMO | Admitting: Podiatry

## 2021-05-21 DIAGNOSIS — M79674 Pain in right toe(s): Secondary | ICD-10-CM

## 2021-05-21 DIAGNOSIS — M79675 Pain in left toe(s): Secondary | ICD-10-CM

## 2021-05-21 DIAGNOSIS — B351 Tinea unguium: Secondary | ICD-10-CM

## 2021-06-16 ENCOUNTER — Other Ambulatory Visit (INDEPENDENT_AMBULATORY_CARE_PROVIDER_SITE_OTHER): Payer: Self-pay | Admitting: Primary Care

## 2021-06-16 NOTE — Telephone Encounter (Signed)
Requested medications are due for refill today.  yes  Requested medications are on the active medications list.  yes  Last refill. 06/17/2020  Future visit scheduled.   no  Notes to clinic.  Patient is overdue for labwork.

## 2021-06-17 NOTE — Telephone Encounter (Signed)
Routed to last prescribing provider to refill if appropriate.

## 2021-06-22 ENCOUNTER — Encounter: Payer: Self-pay | Admitting: Podiatry

## 2021-06-22 ENCOUNTER — Other Ambulatory Visit: Payer: Self-pay

## 2021-06-22 ENCOUNTER — Other Ambulatory Visit (INDEPENDENT_AMBULATORY_CARE_PROVIDER_SITE_OTHER): Payer: Self-pay | Admitting: Primary Care

## 2021-06-22 ENCOUNTER — Ambulatory Visit (INDEPENDENT_AMBULATORY_CARE_PROVIDER_SITE_OTHER): Payer: Medicare HMO | Admitting: Podiatry

## 2021-06-22 DIAGNOSIS — M79675 Pain in left toe(s): Secondary | ICD-10-CM | POA: Diagnosis not present

## 2021-06-22 DIAGNOSIS — M79674 Pain in right toe(s): Secondary | ICD-10-CM

## 2021-06-22 DIAGNOSIS — L851 Acquired keratosis [keratoderma] palmaris et plantaris: Secondary | ICD-10-CM

## 2021-06-22 DIAGNOSIS — L84 Corns and callosities: Secondary | ICD-10-CM | POA: Diagnosis not present

## 2021-06-22 DIAGNOSIS — E119 Type 2 diabetes mellitus without complications: Secondary | ICD-10-CM | POA: Diagnosis not present

## 2021-06-22 DIAGNOSIS — B351 Tinea unguium: Secondary | ICD-10-CM

## 2021-06-22 DIAGNOSIS — Z760 Encounter for issue of repeat prescription: Secondary | ICD-10-CM

## 2021-06-22 MED ORDER — GORDONS UREA 40 % EX OINT: TOPICAL_OINTMENT | CUTANEOUS | 2 refills | Status: DC

## 2021-06-22 NOTE — Telephone Encounter (Unsigned)
Copied from [place] [id number]. Topic: Quick Communication - Rx Refill/Question >> Jun 22, 2021 10:30 AM Yvette Rack wrote: Medication: albuterol (VENTOLIN HFA) 108 (90 Base) MCG/ACT inhaler, furosemide (LASIX) 40 MG tablet, and gabapentin (NEURONTIN) 600 MG tablet  Has the patient contacted their pharmacy? Yes.   (Agent: If no, request that the patient contact the pharmacy for the refill.) (Agent: If yes, when and what did the pharmacy advise?)  Preferred Pharmacy (with phone number or street name): [facility] ([facility] - [place], [place] Phone: [phone]   Fax: [phone]  Agent: Please be advised that RX refills may take up to 3 business days. We ask that you follow-up with your pharmacy.

## 2021-06-23 NOTE — Telephone Encounter (Signed)
Requested medications are due for refill today.  yes  Requested medications are on the active medications list.  yes  Last refill. Albuterol - 03/05/2021, Furosemide - 07/11/2020, Gabapentin  - 02/27/2020  Future visit scheduled.   no  Notes to clinic.   Gabapentin is a historical medication, Rx is expired Albuterol Rx was signed by Dr. Melvyn Novas. Also pt is using more than I inhaler a month. Furosemide - failed protocol and Rx was signed by Dr. Margarita Rana.

## 2021-06-23 NOTE — Telephone Encounter (Signed)
Sent to PCP ?

## 2021-06-28 ENCOUNTER — Telehealth (INDEPENDENT_AMBULATORY_CARE_PROVIDER_SITE_OTHER): Payer: Self-pay

## 2021-06-28 ENCOUNTER — Other Ambulatory Visit (INDEPENDENT_AMBULATORY_CARE_PROVIDER_SITE_OTHER): Payer: Self-pay | Admitting: Primary Care

## 2021-06-28 NOTE — Progress Notes (Signed)
Subjective: Stacey Lin is a pleasant 55 y.o. female patient seen today for preventative diabetic foot care and painful porokeratotic lesion(s) b/l feet and painful mycotic toenails that limit ambulation. Painful toenails interfere with ambulation. Aggravating factors include wearing enclosed shoe gear. Pain is relieved with periodic professional debridement. Painful porokeratotic lesions are aggravated when weightbearing with and without shoegear. Pain is relieved with periodic professional debridement.   Patient does not routinely monitor his/her blood glucose.  PCP is [name], NP. Last visit was: 02/11/2021.  Allergies  Allergen Reactions   Ketoconazole Rash    Objective: Physical Exam  General: Stacey Lin is a pleasant 55 y.o. African American female, in NAD. AAO x 3.   Vascular:  Capillary refill time to digits immediate b/l. Palpable DP pulse(s) b/l lower extremities Palpable PT pulse(s) b/l lower extremities Pedal hair absent. Lower extremity skin temperature gradient within normal limits. No pain with calf compression b/l. No edema noted b/l lower extremities.  Dermatological:  Skin warm and supple b/l lower extremities. No open wounds b/l lower extremities. No interdigital macerations b/l lower extremities. Toenails 1-5 b/l elongated, discolored, dystrophic, thickened, crumbly with subungual debris and tenderness to dorsal palpation. Hyperkeratotic lesion(s) plantar aspect b/l heel pads.  No erythema, no edema, no drainage, no fluctuance.  Musculoskeletal:  Normal muscle strength 5/5 to all lower extremity muscle groups bilaterally. No pain crepitus or joint limitation noted with ROM b/l lower extremities. No gross bony deformities b/l lower extremities. Patient ambulates independent of any assistive aids.  Neurological:  Protective sensation intact 5/5 intact bilaterally with 10g monofilament b/l. Vibratory sensation intact b/l. Proprioception intact  bilaterally.  Assessment and Plan:  1. Pain due to onychomycosis of toenails of both feet   2. Callus   3. Controlled type 2 diabetes mellitus without complication, without long-term current use of insulin ([place])      -Examined patient. -Patient refused to sign Medicaid ABN for callus paring on today's visit. Discussed treatment of Urea Ointment 40% to be applied to calluses of heels once daily and weekly filing with non-metal emery file. Rx sent to patient's pharmacy for Urea Ointment 40% to be applied to heel calluses only once daily. Avoid application to normal skin. -Continue diabetic foot care principles: inspect feet daily, monitor glucose as recommended by PCP and/or Endocrinologist, and follow prescribed diet per PCP, Endocrinologist and/or dietician. -Patient to continue soft, supportive shoe gear daily. -Toenails 1-5 b/l were debrided in length and girth with sterile nail nippers and dremel without iatrogenic bleeding.  -Patient to report any pedal injuries to medical professional immediately. -Patient/POA to call should there be question/concern in the interim.  Return in about 3 months (around 09/22/2021).  Marzetta Board, DPM

## 2021-06-28 NOTE — Telephone Encounter (Signed)
Discontinue Lasix followed by cardiology seen on 06/14/21 prescribed spirolactone

## 2021-06-28 NOTE — Telephone Encounter (Signed)
Attempted to call and schedule AWV by phone but no workingnumbers. Unable to contact.

## 2021-07-20 ENCOUNTER — Telehealth: Payer: Self-pay | Admitting: Primary Care

## 2021-07-20 NOTE — Telephone Encounter (Signed)
Tried calling patient to schedule Medicare Annual Wellness Visit (AWV)  No answer   awvi 01/22/17 per [place]  please schedule at anytime with health coach

## 2021-07-21 ENCOUNTER — Other Ambulatory Visit (INDEPENDENT_AMBULATORY_CARE_PROVIDER_SITE_OTHER): Payer: Self-pay | Admitting: Primary Care

## 2021-07-21 ENCOUNTER — Other Ambulatory Visit (INDEPENDENT_AMBULATORY_CARE_PROVIDER_SITE_OTHER): Payer: Self-pay | Admitting: Family Medicine

## 2021-07-21 DIAGNOSIS — Z760 Encounter for issue of repeat prescription: Secondary | ICD-10-CM

## 2021-07-21 NOTE — Telephone Encounter (Signed)
Requested medications are due for refill today.  unknown  Requested medications are on the active medications list.  no  Last refill. 05/08/2020  Future visit scheduled.   no  Notes to clinic.  This dose of this medication was changed.

## 2021-08-23 ENCOUNTER — Other Ambulatory Visit (INDEPENDENT_AMBULATORY_CARE_PROVIDER_SITE_OTHER): Payer: Self-pay | Admitting: Primary Care

## 2021-08-23 NOTE — Telephone Encounter (Signed)
Courtesy refill. Patient will need and office visit for further refills. Requested Prescriptions  Pending Prescriptions Disp Refills  . escitalopram (LEXAPRO) 20 MG tablet [Pharmacy Med Name: ESCITALOPRAM OXALATE 20 MG Tablet] 30 tablet 0    Sig: TAKE 1 TABLET EVERY DAY     Psychiatry:  Antidepressants - SSRI Failed - 08/23/2021  7:58 PM      Failed - Valid encounter within last 6 months    Recent Outpatient Visits          6 months ago Type 2 diabetes mellitus without complication, without long-term current use of insulin ([place])   [place] [facility] [name], NP   1 year ago Depression with anxiety   [place] Juluis Mire P, NP   1 year ago Type 2 diabetes mellitus without complication, without long-term current use of insulin ([place])   [place] [facility] [name], NP   1 year ago Syncope and collapse   [facility] [name], NP   1 year ago Type 2 diabetes mellitus without complication, without long-term current use of insulin ([place])   [facility] [name], NP             Passed - Completed PHQ-2 or PHQ-9 in the last 360 days      . losartan (COZAAR) 25 MG tablet [Pharmacy Med Name: LOSARTAN POTASSIUM 25 MG Tablet] 30 tablet 0    Sig: TAKE 1 TABLET (25 MG TOTAL) BY MOUTH DAILY.     Cardiovascular:  Angiotensin Receptor Blockers Failed - 08/23/2021  7:58 PM      Failed - Cr in normal range and within 180 days    Creat  Date Value Ref Range Status  09/16/2015 1.49 (H) 0.50 - 1.05 mg/dL Final   Creatinine, Ser  Date Value Ref Range Status  09/03/2019 1.18 (H) 0.44 - 1.00 mg/dL Final   Creatinine,U  Date Value Ref Range Status  08/05/2009 115.4 mg/dL Final    Comment:    See lab report for associated comment(s)         Failed - K in normal range and within 180 days    Potassium  Date Value Ref Range Status  09/03/2019  4.0 3.5 - 5.1 mmol/L Final         Failed - Valid encounter within last 6 months    Recent Outpatient Visits          6 months ago Type 2 diabetes mellitus without complication, without long-term current use of insulin ([place])   [place] [facility] [name], NP   1 year ago Depression with anxiety   [place] [name], NP   1 year ago Type 2 diabetes mellitus without complication, without long-term current use of insulin ([place])   [place] [facility] [name], NP   1 year ago Syncope and collapse   [facility] [name], NP   1 year ago Type 2 diabetes mellitus without complication, without long-term current use of insulin ([place])   [facility] [name], NP             Passed - Patient is not pregnant      Passed - Last BP in normal range    BP Readings from Last 1 Encounters:  03/05/21 124/78

## 2021-09-14 ENCOUNTER — Other Ambulatory Visit: Payer: Self-pay | Admitting: Internal Medicine

## 2021-09-27 ENCOUNTER — Ambulatory Visit: Payer: Medicare HMO | Admitting: Nurse Practitioner

## 2021-09-29 ENCOUNTER — Ambulatory Visit: Payer: Medicare HMO | Admitting: Podiatry

## 2021-11-01 ENCOUNTER — Ambulatory Visit (INDEPENDENT_AMBULATORY_CARE_PROVIDER_SITE_OTHER): Payer: Self-pay

## 2021-11-01 NOTE — Telephone Encounter (Signed)
Called pt unable to [facility] VM not set up.

## 2021-11-01 NOTE — Telephone Encounter (Signed)
Summary: advice - covid   Pt stated she tested positive for COVID 3 days ago, pt stated she is not feeling well and wanted to know if something could be sent in, pt has upcoming appt on 1/12.      Pt.'s voice mailbox not set up. Unable to leave message.

## 2021-11-01 NOTE — Telephone Encounter (Signed)
Summary: advice - covid   Pt stated she tested positive for COVID 3 days ago, pt stated she is not feeling well and wanted to know if something could be sent in, pt has upcoming appt on 1/12.       Chief Complaint: requesting medication covid positive 10/27/21 Symptoms: cough productive yellow sputum, hot / cold chills, sore throat, coughing spells causing gagging/vomiting shortness of breath after coughing , diarrhea x 3. Frequency: started 10/23/21 Pertinent Negatives: Patient denies chest pain , difficulty breathing  Disposition: [x] ED /[] Urgent Care (no appt availability in office) / [] Appointment(In office/virtual)/ []  [facility]/ [] Home Care/ [] Refused Recommended Disposition /[] [facility]/ []  Follow-up with PCP Additional Notes:  Multiple chronic medical hx. Recommended ED  appt already scheduled for 11/04/21. Please advise if patient can be given medication . Patient requesting a call back from PCP if ED recommended.     Reason for Disposition  [1] HIGH RISK for severe COVID complications (e.g., weak immune system, age > 59 years, obesity with BMI > 25, pregnant, chronic lung disease or other chronic medical condition) AND [2] COVID symptoms (e.g., cough, fever)  (Exceptions: Already seen by PCP and no new or worsening symptoms.)  Answer Assessment - Initial Assessment Questions 1. COVID-19 DIAGNOSIS: "Who made your COVID-19 diagnosis?" "Was it confirmed by a positive lab test or self-test?" If not diagnosed by a doctor (or NP/PA), ask "Are there lots of cases (community spread) where you live?" Note: See public health department website, if unsure.     At home test positive 10/27/21 2. COVID-19 EXPOSURE: "Was there any known exposure to COVID before the symptoms began?" CDC Definition of close contact: within 6 feet (2 meters) for a total of 15 minutes or more over a 24-hour period.      no 3. ONSET: "When did the COVID-19 symptoms start?"      Last Sunday  10/23/21 4. WORST SYMPTOM: "What is your worst symptom?" (e.g., cough, fever, shortness of breath, muscle aches)     Cough, fever, hot and cold chills , sore throat ,  5. COUGH: "Do you have a cough?" If Yes, ask: "How bad is the cough?"       Yes , coughing spells causes gagging/vomiting  6. FEVER: "Do you have a fever?" If Yes, ask: "What is your temperature, how was it measured, and when did it start?"     Feels like a fever due to hot and cold chills . 7. RESPIRATORY STATUS: "Describe your breathing?" (e.g., shortness of breath, wheezing, unable to speak)      Shortness of breath after coughing 8. BETTER-SAME-WORSE: "Are you getting better, staying the same or getting worse compared to yesterday?"  If getting worse, ask, "In what way?"     Worse  9. HIGH RISK DISEASE: "Do you have any chronic medical problems?" (e.g., asthma, heart or lung disease, weak immune system, obesity, etc.)     Asthma, heart and lung disease  diabetic  10. VACCINE: "Have you had the COVID-19 vaccine?" If Yes, ask: "Which one, how many shots, when did you get it?"       [employer]  11. BOOSTER: "Have you received your COVID-19 booster?" If Yes, ask: "Which one and when did you get it?"       X 1 Pfizer  12. PREGNANCY: "Is there any chance you are pregnant?" "When was your last menstrual period?"       na 13. OTHER SYMPTOMS: "Do you have  any other symptoms?"  (e.g., chills, fatigue, headache, loss of smell or taste, muscle pain, sore throat)       Chills, headache, nasal congestion , sore throat , coughing spells with vomiting at times , diarrhea  14. O2 SATURATION MONITOR:  "Do you use an oxygen saturation monitor (pulse oximeter) at home?" If Yes, ask "What is your reading (oxygen level) today?" "What is your usual oxygen saturation reading?" (e.g., 95%)       na  Protocols used: Coronavirus (COVID-19) Diagnosed or Suspected-A-AH

## 2021-11-02 ENCOUNTER — Encounter (INDEPENDENT_AMBULATORY_CARE_PROVIDER_SITE_OTHER): Payer: Self-pay | Admitting: *Deleted

## 2021-11-02 NOTE — Telephone Encounter (Signed)
This encounter was created in error - please disregard.

## 2021-11-02 NOTE — Telephone Encounter (Signed)
Patient returned call and requesting to be seen by PCP. Instructed patient to quarantine  for 5 days as instructed during triage encounter 11/01/21. Patient report she is feeling the same but would like to see PCP for medication . Please advise . Recommended ED if sx worsen.

## 2021-11-02 NOTE — Telephone Encounter (Signed)
Agreed or virtual visit

## 2021-11-02 NOTE — Telephone Encounter (Signed)
Tried to reach patient - voice mail not set up yet. Quarentine for 5 days.

## 2021-11-02 NOTE — Telephone Encounter (Signed)
Please contact patient about medication request

## 2021-11-04 ENCOUNTER — Encounter (INDEPENDENT_AMBULATORY_CARE_PROVIDER_SITE_OTHER): Payer: Self-pay | Admitting: Primary Care

## 2021-11-04 ENCOUNTER — Other Ambulatory Visit: Payer: Self-pay

## 2021-11-04 ENCOUNTER — Ambulatory Visit (INDEPENDENT_AMBULATORY_CARE_PROVIDER_SITE_OTHER): Payer: Commercial Managed Care - HMO | Admitting: Primary Care

## 2021-11-04 VITALS — BP 117/74 | HR 96 | Temp 97.3°F | Ht 60.0 in | Wt 166.6 lb

## 2021-11-04 DIAGNOSIS — F418 Other specified anxiety disorders: Secondary | ICD-10-CM

## 2021-11-04 DIAGNOSIS — U099 Post covid-19 condition, unspecified: Secondary | ICD-10-CM | POA: Diagnosis not present

## 2021-11-04 DIAGNOSIS — R053 Chronic cough: Secondary | ICD-10-CM

## 2021-11-04 DIAGNOSIS — E119 Type 2 diabetes mellitus without complications: Secondary | ICD-10-CM

## 2021-11-04 LAB — POCT GLYCOSYLATED HEMOGLOBIN (HGB A1C): Hemoglobin A1C: 6.2 % — AB (ref 4.0–5.6)

## 2021-11-04 MED ORDER — HYDROCODONE BIT-HOMATROP MBR 5-1.5 MG/5ML PO SOLN: 5.0000 mL | Freq: Three times a day (TID) | ORAL | 0 refills | Status: DC | PRN

## 2021-11-04 MED ORDER — METFORMIN HCL ER 500 MG PO TB24: ORAL_TABLET | ORAL | 1 refills | Status: DC

## 2021-11-04 MED ORDER — ESCITALOPRAM OXALATE 20 MG PO TABS: 20.0000 mg | ORAL_TABLET | Freq: Every day | ORAL | 1 refills | Status: DC

## 2021-11-04 NOTE — Patient Instructions (Signed)
Loss of taste eat foods and place on your tongue to help taste buds return. Start with bitter and sour

## 2021-11-04 NOTE — Progress Notes (Signed)
Acute Office Visit  Subjective:    Patient ID: Stacey Lin, female    DOB: 01-05-1966, 56 y.o.   MRN: [id number]  Chief Complaint  Patient presents with   Diabetes   Stacey Lin in today for cough - productive, thick, constant with episodes of gaging when phlegm was stuck in her throat. Explained this is a COVID lingering cough which can last up to 3 months. Management of type 2 diabetes. She denies increase in polyuria, polyphagia, polydipsia or vision changes. Also, tearful and crying lost her dog after 5 years on Sunday- this particular night Oreo would not sleep under her but went to the foot of the bed prior to this he would not eat or drink for the last 2 days and was throwing up. Oreo went to the foot of the bed looked at her and went to Dog heaven.  Diabetes She presents for her follow-up diabetic visit. She has type 2 diabetes mellitus. No MedicAlert identification noted. Her disease course has been improving. There are no hypoglycemic associated symptoms. There are no diabetic associated symptoms. There are no hypoglycemic complications. Symptoms are stable. There are no diabetic complications. Risk factors for coronary artery disease include diabetes mellitus. She is compliant with treatment all of the time. Her home blood glucose trend is decreasing steadily.   Past Medical History:  Diagnosis Date   Anemia    Anginal pain (HCC)    2 months   Arrhythmia    Asthma    CHF (congestive heart failure) (HCC)    1990s   Chronic headaches    COPD (chronic obstructive pulmonary disease) (HCC)    ??   Hypercholesteremia    Hypertension     Past Surgical History:  Procedure Laterality Date   LEFT AND RIGHT HEART CATHETERIZATION WITH CORONARY ANGIOGRAM N/A 06/06/2014   Procedure: LEFT AND RIGHT HEART CATHETERIZATION WITH CORONARY ANGIOGRAM;  Surgeon: Dolores Patty, MD;  Location: [facility] CATH LAB;  Service: Cardiovascular;  Laterality: N/A;   MULTIPLE EXTRACTIONS WITH  ALVEOLOPLASTY Bilateral 07/24/2015   Procedure: MULTIPLE EXTRACTIONS WITH ALVEOLOPLASTY,  BILATERAL TORI ;  Surgeon: Ocie Doyne, DDS;  Location: [facility] OR;  Service: Oral Surgery;  Laterality: Bilateral;   TUBAL LIGATION  1986    Family History  Problem Relation Age of Onset   Hypertension Mother    Allergies Mother    Asthma Mother    Heart disease Mother    Stomach cancer Mother        Deceased, 47   Seizures Mother    Hypertension Father        Deceased   Hypertension Maternal Grandmother    Healthy Brother    Healthy Son    Healthy Daughter     Social History   Socioeconomic History   Marital status: Legally Separated    Spouse name: Not on file   Number of children: Not on file   Years of education: Not on file   Highest education level: Not on file  Occupational History   Not on file  Tobacco Use   Smoking status: Former    Packs/day: 1.00    Years: 35.00    Pack years: 35.00    Types: Cigarettes    Quit date: 08/26/2019    Years since quitting: 2.1   Smokeless tobacco: Never  Vaping Use   Vaping Use: Never used  Substance and Sexual Activity   Alcohol use: No    Alcohol/week: 0.0 standard drinks   Drug  use: Not Currently    Frequency: 4.0 times per week    Types: "Crack" cocaine    Comment: RELAPSE 12/2016   Sexual activity: Not on file  Other Topics Concern   Not on file  Social History Narrative   Lives with son in an apartment on the third floor.  Does not work.  On disability.  Previously worked in [employer].    Education: high school.     Social Determinants of Health   Financial Resource Strain: Not on file  Food Insecurity: Not on file  Transportation Needs: Not on file  Physical Activity: Not on file  Stress: Not on file  Social Connections: Not on file  Intimate Partner Violence: Not on file    Outpatient Medications Prior to Visit  Medication Sig Dispense Refill   ACCU-CHEK GUIDE test strip TEST THREE TIMES DAILY 300 strip 0    Accu-Chek Softclix Lancets lancets TEST BLOOD SUGAR THREE TIMES DAILY 300 each 0   acetaminophen (TYLENOL) 500 MG tablet Take 500 mg by mouth every 6 (six) hours as needed.     albuterol (VENTOLIN HFA) 108 (90 Base) MCG/ACT inhaler Inhale 2 puffs into the lungs every 6 (six) hours as needed for wheezing or shortness of breath. 1 each 5   allopurinol (ZYLOPRIM) 100 MG tablet TAKE 1 TABLET TWICE DAILY 180 tablet 0   ammonium lactate (AMLACTIN) 12 % lotion Apply 1 application topically 2 (two) times daily. 400 g 4   atorvastatin (LIPITOR) 40 MG tablet Take 1 tablet (40 mg total) by mouth daily. 90 tablet 3   Blood Glucose Monitoring Suppl (ACCU-CHEK AVIVA PLUS) w/Device KIT 1 each by Does not apply route 3 (three) times daily. 1 kit 0   Budeson-Glycopyrrol-Formoterol (BREZTRI AEROSPHERE) 160-9-4.8 MCG/ACT AERO Inhale 2 puffs into the lungs in the morning and at bedtime. 5.9 g 0   budesonide-formoterol (SYMBICORT) 80-4.5 MCG/ACT inhaler TAKE 2 PUFFS BY MOUTH FIRST THING IN MORNING THEN 2 PUFFS 12 HOURS LATER 10.2 g 5   budesonide-formoterol (SYMBICORT) 80-4.5 MCG/ACT inhaler TAKE 2 PUFFS BY MOUTH FIRST THING IN MORNING THEN 2 PUFFS 12 HOURS LATER 10.2 g 3   carvedilol (COREG) 3.125 MG tablet TAKE 1 TABLET BY MOUTH TWICE DAILY 60 tablet 5   famotidine (PEPCID) 20 MG tablet One after supper 30 tablet 11   fluticasone (FLONASE) 50 MCG/ACT nasal spray Place 2 sprays into both nostrils daily. As needed for nasal vongestion 16 g 6   furosemide (LASIX) 40 MG tablet TAKE 1 TABLET TWICE DAILY 180 tablet 0   gabapentin (NEURONTIN) 600 MG tablet Take 600 mg by mouth.     ketotifen (ALAWAY) 0.025 % ophthalmic solution Place 1 drop into both eyes 2 (two) times daily. 5 mL 0   losartan (COZAAR) 25 MG tablet TAKE 1 TABLET (25 MG TOTAL) BY MOUTH DAILY. 30 tablet 0   NARCAN 4 MG/0.1ML LIQD nasal spray kit NAR REP ALN  0   pantoprazole (PROTONIX) 40 MG tablet Take 1 tablet (40 mg total) by mouth daily. Take 30-60 min  before first meal of the day 30 tablet 2   spironolactone (ALDACTONE) 25 MG tablet TAKE 1 TABLET DAILY 90 tablet 3   urea (GORDONS UREA) 40 % ointment Apply to rough skin on heels once daily. 30 g 2   escitalopram (LEXAPRO) 20 MG tablet TAKE 1 TABLET EVERY DAY 30 tablet 0   glimepiride (AMARYL) 2 MG tablet TAKE 1 TABLET EVERY MORNING 90 tablet 0  metFORMIN (GLUCOPHAGE-XR) 500 MG 24 hr tablet TAKE 1 TABLETS TWICE A DAY 180 tablet 1   ALPRAZolam (XANAX) 1 MG tablet Take 1 tablet (1 mg total) by mouth 2 (two) times daily as needed for anxiety. (Patient not taking: Reported on 03/05/2021) 20 tablet 0   baclofen (LIORESAL) 10 MG tablet Take 20 mg by mouth at bedtime. (Patient not taking: Reported on 03/05/2021)     nystatin-triamcinolone ointment (MYCOLOG) Apply 1 application topically 2 (two) times daily. 30 g 0   No facility-administered medications prior to visit.    Allergies  Allergen Reactions   Ketoconazole Rash    Review of Systems  Respiratory:  Positive for cough.   Psychiatric/Behavioral:         Depression  All other systems reviewed and are negative.    Objective:    BP 117/74 (BP Location: Left Arm, Patient Position: Sitting, Cuff Size: Normal)    Pulse 96    Temp (!) 97.3 F (36.3 C) (Temporal)    Ht 5' (1.524 m)    Wt 166 lb 9.6 oz (75.6 kg)    LMP  (LMP Unknown)    SpO2 100%    BMI 32.54 kg/m  Wt Readings from Last 3 Encounters:  11/04/21 166 lb 9.6 oz (75.6 kg)  03/05/21 173 lb (78.5 kg)  02/11/21 175 lb 3.2 oz (79.5 kg)    Health Maintenance Due  Topic Date Due   COVID-19 Vaccine (1) Never done   Fecal DNA (Cologuard)  Never done   Zoster Vaccines- Shingrix (1 of 2) Never done   Pneumococcal Vaccine 51-64 Years old (2 - PCV) 04/05/2017   OPHTHALMOLOGY EXAM  10/05/2019   FOOT EXAM  09/03/2020   MAMMOGRAM  09/12/2020   PAP SMEAR-Modifier  02/22/2021    There are no preventive care reminders to display for this patient.   Lab Results  Component Value Date    TSH 2.240 11/27/2018   Lab Results  Component Value Date   WBC 11.6 (H) 12/19/2018   HGB 12.0 12/19/2018   HCT 36.3 12/19/2018   MCV 91.1 12/19/2018   PLT 321.0 12/19/2018   Lab Results  Component Value Date   NA 142 09/03/2019   K 4.0 09/03/2019   CO2 29 09/03/2019   GLUCOSE 174 (H) 09/03/2019   BUN 22 (H) 09/03/2019   CREATININE 1.18 (H) 09/03/2019   BILITOT 0.3 09/17/2018   ALKPHOS 142 (H) 09/19/2018   AST 20 09/17/2018   ALT 24 09/17/2018   PROT 7.3 09/17/2018   ALBUMIN 4.6 09/17/2018   CALCIUM 9.2 09/03/2019   ANIONGAP 10 09/03/2019   Lab Results  Component Value Date   CHOL 218 (H) 01/09/2018   Lab Results  Component Value Date   HDL 41 01/09/2018   Lab Results  Component Value Date   LDLCALC 152 (H) 01/09/2018   Lab Results  Component Value Date   TRIG 123 01/09/2018   Lab Results  Component Value Date   CHOLHDL 5.3 (H) 01/09/2018   Lab Results  Component Value Date   HGBA1C 6.2 (A) 11/04/2021       Assessment & Plan:  Stacey Lin was seen today for diabetes.  Diagnoses and all orders for this visit:  Type 2 diabetes mellitus without complication, without long-term current use of insulin (HCC) -     HgB A1c 6.2 decreased metformin XR 500mg  daily. Continue to monitor foods that are high in carbohydrates are the following rice, potatoes, breads, sugars, and pastas.  Reduction in the intake (eating) will assist in lowering your blood sugars.   COVID-19 long hauler manifesting chronic cough Consult with her pulmonologist for recommendation for tx. Inform patient if not any better in a week make a f/u appt with him -     HYDROcodone bit-homatropine (HYCODAN) 5-1.5 MG/5ML syrup; Take 5 mLs by mouth every 8 (eight) hours as needed for cough.  Depression with anxiety Previously prescribed Lexapro 20mg  for 30 and no refills . Patient feels she needs to be back on it and feels like this is why she has mood fluctuations.   Other orders -     metFORMIN  (GLUCOPHAGE-XR) 500 MG 24 hr tablet; TAKE 1 TABLETS ONCE A DAY -     escitalopram (LEXAPRO) 20 MG tablet; Take 1 tablet (20 mg total) by mouth daily.     Meds ordered this encounter  Medications   metFORMIN (GLUCOPHAGE-XR) 500 MG 24 hr tablet    Sig: TAKE 1 TABLETS ONCE A DAY    Dispense:  90 tablet    Refill:  1   escitalopram (LEXAPRO) 20 MG tablet    Sig: Take 1 tablet (20 mg total) by mouth daily.    Dispense:  90 tablet    Refill:  1    Courtesy refill. Patient will need and office visit for further refills.   HYDROcodone bit-homatropine (HYCODAN) 5-1.5 MG/5ML syrup    Sig: Take 5 mLs by mouth every 8 (eight) hours as needed for cough.    Dispense:  120 mL    Refill:  0     Grayce Sessions, NP

## 2021-11-05 ENCOUNTER — Other Ambulatory Visit (INDEPENDENT_AMBULATORY_CARE_PROVIDER_SITE_OTHER): Payer: Self-pay | Admitting: Primary Care

## 2021-11-05 MED ORDER — ESCITALOPRAM OXALATE 20 MG PO TABS
20.0000 mg | ORAL_TABLET | Freq: Every day | ORAL | 1 refills | Status: DC
Start: 2021-11-05 — End: 2022-05-19

## 2021-11-05 NOTE — Telephone Encounter (Signed)
[facility] advised the pt they do not have the escitalopram (LEXAPRO) 20 MG tablet It s on backorder with them. However, [facility]. [facility] to  [id number] [place], [place].  Pt states this is something she cannot just go off of. Please advise.

## 2021-11-05 NOTE — Telephone Encounter (Signed)
Requested Prescriptions  Pending Prescriptions Disp Refills   escitalopram (LEXAPRO) 20 MG tablet 90 tablet 1    Sig: Take 1 tablet (20 mg total) by mouth daily.     Psychiatry:  Antidepressants - SSRI Passed - 11/05/2021 12:42 PM      Passed - Completed PHQ-2 or PHQ-9 in the last 360 days      Passed - Valid encounter within last 6 months    Recent Outpatient Visits          Yesterday Type 2 diabetes mellitus without complication, without long-term current use of insulin ([place])   [place] [facility] Juluis Mire P, NP   8 months ago Type 2 diabetes mellitus without complication, without long-term current use of insulin ([place])   [place] [facility] [name], NP   1 year ago Depression with anxiety   [place] [name], NP   1 year ago Type 2 diabetes mellitus without complication, without long-term current use of insulin ([place])   [place] [facility] [name], NP   2 years ago Syncope and collapse   [facility] [name], NP      Future Appointments            In 1 month Oletta Lamas, Milford Cage, NP [place]

## 2021-11-10 ENCOUNTER — Ambulatory Visit (INDEPENDENT_AMBULATORY_CARE_PROVIDER_SITE_OTHER): Payer: Medicaid Other | Admitting: Podiatry

## 2021-11-10 DIAGNOSIS — Z91199 Patient's noncompliance with other medical treatment and regimen due to unspecified reason: Secondary | ICD-10-CM

## 2021-11-16 NOTE — Progress Notes (Signed)
   Complete physical exam  Patient: Stacey Lin   DOB: 08/13/2001   56 y.o. Female  MRN: [id number]  Subjective:    No chief complaint on file.   Stacey Lin is a 56 y.o. female who presents today for a complete physical exam. She reports consuming a {diet types:17450} diet. {types:19826} She generally feels {DESC; WELL/FAIRLY WELL/POORLY:18703}. She reports sleeping {DESC; WELL/FAIRLY WELL/POORLY:18703}. She {does/does not:200015} have additional problems to discuss today.    Most recent fall risk assessment:    04/20/2022   10:42 AM  Fall Risk   Falls in the past year? 0  Number falls in past yr: 0  Injury with Fall? 0  Risk for fall due to : No Fall Risks  Follow up Falls evaluation completed     Most recent depression screenings:    04/20/2022   10:42 AM 03/11/2021   10:46 AM  PHQ 2/9 Scores  PHQ - 2 Score 0 0  PHQ- 9 Score 5     {VISON DENTAL STD PSA (Optional):27386}  {History (Optional):23778}  Patient Care Team: Jessup, Joy, NP as PCP - General (Nurse Practitioner)   Outpatient Medications Prior to Visit  Medication Sig   fluticasone (FLONASE) 50 MCG/ACT nasal spray Place 2 sprays into both nostrils in the morning and at bedtime. After 7 days, reduce to once daily.   norgestimate-ethinyl estradiol (SPRINTEC 28) 0.25-35 MG-MCG tablet Take 1 tablet by mouth daily.   Nystatin POWD Apply liberally to affected area 2 times per day   spironolactone (ALDACTONE) 100 MG tablet Take 1 tablet (100 mg total) by mouth daily.   No facility-administered medications prior to visit.    ROS        Objective:     There were no vitals taken for this visit. {Vitals History (Optional):23777}  Physical Exam   No results found for any visits on 05/26/22. {Show previous labs (optional):23779}    Assessment & Plan:    Routine Health Maintenance and Physical Exam  Immunization History  Administered Date(s) Administered   DTaP 10/27/1999, 12/23/1999,  03/02/2000, 11/16/2000, 06/01/2004   Hepatitis A 03/28/2008, 04/03/2009   Hepatitis B 08/14/1999, 09/21/1999, 03/02/2000   HiB (PRP-OMP) 10/27/1999, 12/23/1999, 03/02/2000, 11/16/2000   IPV 10/27/1999, 12/23/1999, 08/21/2000, 06/01/2004   Influenza,inj,Quad PF,6+ Mos 07/04/2014   Influenza-Unspecified 10/03/2012   MMR 08/21/2001, 06/01/2004   Meningococcal Polysaccharide 04/02/2012   Pneumococcal Conjugate-13 11/16/2000   Pneumococcal-Unspecified 03/02/2000, 05/16/2000   Tdap 04/02/2012   Varicella 08/21/2000, 03/28/2008    Health Maintenance  Topic Date Due   HIV Screening  Never done   Hepatitis C Screening  Never done   INFLUENZA VACCINE  05/24/2022   PAP-Cervical Cytology Screening  05/26/2022 (Originally 08/12/2020)   PAP SMEAR-Modifier  05/26/2022 (Originally 08/12/2020)   TETANUS/TDAP  05/26/2022 (Originally 04/02/2022)   HPV VACCINES  Discontinued   COVID-19 Vaccine  Discontinued    Discussed health benefits of physical activity, and encouraged her to engage in regular exercise appropriate for her age and condition.  Problem List Items Addressed This Visit   None Visit Diagnoses     Annual physical exam    -  Primary   Cervical cancer screening       Need for Tdap vaccination          No follow-ups on file.     Joy Jessup, NP   

## 2021-12-10 ENCOUNTER — Ambulatory Visit: Payer: Medicare Other | Admitting: Podiatry

## 2021-12-14 ENCOUNTER — Ambulatory Visit (INDEPENDENT_AMBULATORY_CARE_PROVIDER_SITE_OTHER): Payer: Medicare Other | Admitting: Podiatry

## 2021-12-14 DIAGNOSIS — Z91199 Patient's noncompliance with other medical treatment and regimen due to unspecified reason: Secondary | ICD-10-CM

## 2021-12-16 ENCOUNTER — Encounter (INDEPENDENT_AMBULATORY_CARE_PROVIDER_SITE_OTHER): Payer: Self-pay | Admitting: Primary Care

## 2021-12-16 ENCOUNTER — Other Ambulatory Visit: Payer: Self-pay

## 2021-12-16 ENCOUNTER — Ambulatory Visit (INDEPENDENT_AMBULATORY_CARE_PROVIDER_SITE_OTHER): Payer: Medicare Other | Admitting: Primary Care

## 2021-12-16 VITALS — BP 116/75 | HR 74 | Ht 60.0 in | Wt 166.8 lb

## 2021-12-16 DIAGNOSIS — J4541 Moderate persistent asthma with (acute) exacerbation: Secondary | ICD-10-CM

## 2021-12-16 MED ORDER — LEVOFLOXACIN 500 MG PO TABS: 500.0000 mg | ORAL_TABLET | Freq: Every day | ORAL | 0 refills | Status: AC

## 2021-12-16 MED ORDER — BENZONATATE 200 MG PO CAPS: 200.0000 mg | ORAL_CAPSULE | Freq: Two times a day (BID) | ORAL | 0 refills | Status: DC | PRN

## 2021-12-16 NOTE — Progress Notes (Signed)
[facility]  Stacey Lin, is a 56 y.o. female  WUJ:[id number]  NFA:[id number]  DOB - 09/28/1966  Chief Complaint  Patient presents with   Depression    Medication effectiveness         Subjective:   Stacey Lin is a 56 y.o. female here today for a follow up visit recurrent cough episodes requesting tessalon cough meds. Patient has No headache, No chest pain, No abdominal pain - No Nausea, No new weakness tingling or numbness, or shortness of breath  No problems updated.  Allergies  Allergen Reactions   Ketoconazole Rash    Past Medical History:  Diagnosis Date   Anemia    Anginal pain (HCC)    2 months   Arrhythmia    Asthma    CHF (congestive heart failure) (HCC)    1990s   Chronic headaches    COPD (chronic obstructive pulmonary disease) (HCC)    ??   Hypercholesteremia    Hypertension     Current Outpatient Medications on File Prior to Visit  Medication Sig Dispense Refill   ACCU-CHEK GUIDE test strip TEST THREE TIMES DAILY 300 strip 0   Accu-Chek Softclix Lancets lancets TEST BLOOD SUGAR THREE TIMES DAILY 300 each 0   acetaminophen (TYLENOL) 500 MG tablet Take 500 mg by mouth every 6 (six) hours as needed.     albuterol (VENTOLIN HFA) 108 (90 Base) MCG/ACT inhaler Inhale 2 puffs into the lungs every 6 (six) hours as needed for wheezing or shortness of breath. 1 each 5   allopurinol (ZYLOPRIM) 100 MG tablet TAKE 1 TABLET TWICE DAILY 180 tablet 0   ammonium lactate (AMLACTIN) 12 % lotion Apply 1 application topically 2 (two) times daily. 400 g 4   atorvastatin (LIPITOR) 40 MG tablet Take 1 tablet (40 mg total) by mouth daily. 90 tablet 3   Blood Glucose Monitoring Suppl (ACCU-CHEK AVIVA PLUS) w/Device KIT 1 each by Does not apply route 3 (three) times daily. 1 kit 0   Budeson-Glycopyrrol-Formoterol (BREZTRI AEROSPHERE) 160-9-4.8 MCG/ACT AERO Inhale 2 puffs into the lungs in the morning and at bedtime. 5.9 g 0   budesonide-formoterol  (SYMBICORT) 80-4.5 MCG/ACT inhaler TAKE 2 PUFFS BY MOUTH FIRST THING IN MORNING THEN 2 PUFFS 12 HOURS LATER 10.2 g 5   budesonide-formoterol (SYMBICORT) 80-4.5 MCG/ACT inhaler TAKE 2 PUFFS BY MOUTH FIRST THING IN MORNING THEN 2 PUFFS 12 HOURS LATER 10.2 g 3   carvedilol (COREG) 3.125 MG tablet TAKE 1 TABLET BY MOUTH TWICE DAILY 60 tablet 5   escitalopram (LEXAPRO) 20 MG tablet Take 1 tablet (20 mg total) by mouth daily. 90 tablet 1   famotidine (PEPCID) 20 MG tablet One after supper 30 tablet 11   fluticasone (FLONASE) 50 MCG/ACT nasal spray Place 2 sprays into both nostrils daily. As needed for nasal vongestion 16 g 6   furosemide (LASIX) 40 MG tablet TAKE 1 TABLET TWICE DAILY 180 tablet 0   gabapentin (NEURONTIN) 600 MG tablet Take 600 mg by mouth.     ketotifen (ALAWAY) 0.025 % ophthalmic solution Place 1 drop into both eyes 2 (two) times daily. 5 mL 0   losartan (COZAAR) 25 MG tablet TAKE 1 TABLET (25 MG TOTAL) BY MOUTH DAILY. 30 tablet 0   metFORMIN (GLUCOPHAGE-XR) 500 MG 24 hr tablet TAKE 1 TABLETS ONCE A DAY 90 tablet 1   NARCAN 4 MG/0.1ML LIQD nasal spray kit NAR REP ALN  0   pantoprazole (PROTONIX) 40 MG tablet Take 1 tablet (40  mg total) by mouth daily. Take 30-60 min before first meal of the day 30 tablet 2   spironolactone (ALDACTONE) 25 MG tablet TAKE 1 TABLET DAILY 90 tablet 3   urea (GORDONS UREA) 40 % ointment Apply to rough skin on heels once daily. 30 g 2   No current facility-administered medications on file prior to visit.    Objective:   Vitals:   12/16/21 1507  BP: 116/75  Pulse: 74  SpO2: 97%  Weight: 166 lb 12.8 oz (75.7 kg)  Height: 5' (1.524 m)    Exam General appearance : Awake, alert, not in any distress. Speech Clear. Not toxic looking HEENT: Atraumatic and Normocephalic, pupils equally reactive to light and accomodation Neck: Supple, no JVD. No cervical lymphadenopathy.  Chest: Good air entry bilaterally, no added sounds  CVS: S1 S2 regular, no  murmurs.  Abdomen: Bowel sounds present, Non tender and not distended with no gaurding, rigidity or rebound. Extremities: B/L Lower Ext shows no edema, both legs are warm to touch Neurology: Awake alert, and oriented X 3, CN II-XII intact, Non focal Skin: No Rash  Data Review Lab Results  Component Value Date   HGBA1C 6.2 (A) 11/04/2021   HGBA1C 7.0 (A) 02/11/2021   HGBA1C 6.3 (A) 05/08/2020    Assessment & Plan  Stacey Lin was seen today for depression.  Diagnoses and all orders for this visit:  Asthmatic bronchitis with exacerbation, moderate persistent -     levofloxacin (LEVAQUIN) 500 MG tablet; Take 1 tablet (500 mg total) by mouth daily for 7 days. -     benzonatate (TESSALON) 200 MG capsule; Take 1 capsule (200 mg total) by mouth 2 (two) times daily as needed for cough   Patient have been counseled extensively about nutrition and exercise. Other issues discussed during this visit include: low cholesterol diet, weight control and daily exercise, foot care, annual eye examinations at Ophthalmology, importance of adherence with medications and regular follow-up. We also discussed long term complications of uncontrolled diabetes and hypertension.   Return in about 5 months (around 05/15/2022) for DM/fasting.  The patient was given clear instructions to go to ER or return to medical center if symptoms don't improve, worsen or new problems develop. The patient verbalized understanding. The patient was told to call to get lab results if they haven't heard anything in the next week.   This note has been created with Education officer, environmental. Any transcriptional errors are unintentional.   Grayce Sessions, NP 12/19/2021, 11:40 PM

## 2021-12-19 NOTE — Progress Notes (Signed)
   Complete physical exam  Patient: Stacey Lin   DOB: 08/13/2001   56 y.o. Female  MRN: [id number]  Subjective:    No chief complaint on file.   Stacey Lin is a 56 y.o. female who presents today for a complete physical exam. She reports consuming a {diet types:17450} diet. {types:19826} She generally feels {DESC; WELL/FAIRLY WELL/POORLY:18703}. She reports sleeping {DESC; WELL/FAIRLY WELL/POORLY:18703}. She {does/does not:200015} have additional problems to discuss today.    Most recent fall risk assessment:    04/20/2022   10:42 AM  Fall Risk   Falls in the past year? 0  Number falls in past yr: 0  Injury with Fall? 0  Risk for fall due to : No Fall Risks  Follow up Falls evaluation completed     Most recent depression screenings:    04/20/2022   10:42 AM 03/11/2021   10:46 AM  PHQ 2/9 Scores  PHQ - 2 Score 0 0  PHQ- 9 Score 5     {VISON DENTAL STD PSA (Optional):27386}  {History (Optional):23778}  Patient Care Team: Jessup, Joy, NP as PCP - General (Nurse Practitioner)   Outpatient Medications Prior to Visit  Medication Sig   fluticasone (FLONASE) 50 MCG/ACT nasal spray Place 2 sprays into both nostrils in the morning and at bedtime. After 7 days, reduce to once daily.   norgestimate-ethinyl estradiol (SPRINTEC 28) 0.25-35 MG-MCG tablet Take 1 tablet by mouth daily.   Nystatin POWD Apply liberally to affected area 2 times per day   spironolactone (ALDACTONE) 100 MG tablet Take 1 tablet (100 mg total) by mouth daily.   No facility-administered medications prior to visit.    ROS        Objective:     There were no vitals taken for this visit. {Vitals History (Optional):23777}  Physical Exam   No results found for any visits on 05/26/22. {Show previous labs (optional):23779}    Assessment & Plan:    Routine Health Maintenance and Physical Exam  Immunization History  Administered Date(s) Administered   DTaP 10/27/1999, 12/23/1999,  03/02/2000, 11/16/2000, 06/01/2004   Hepatitis A 03/28/2008, 04/03/2009   Hepatitis B 08/14/1999, 09/21/1999, 03/02/2000   HiB (PRP-OMP) 10/27/1999, 12/23/1999, 03/02/2000, 11/16/2000   IPV 10/27/1999, 12/23/1999, 08/21/2000, 06/01/2004   Influenza,inj,Quad PF,6+ Mos 07/04/2014   Influenza-Unspecified 10/03/2012   MMR 08/21/2001, 06/01/2004   Meningococcal Polysaccharide 04/02/2012   Pneumococcal Conjugate-13 11/16/2000   Pneumococcal-Unspecified 03/02/2000, 05/16/2000   Tdap 04/02/2012   Varicella 08/21/2000, 03/28/2008    Health Maintenance  Topic Date Due   HIV Screening  Never done   Hepatitis C Screening  Never done   INFLUENZA VACCINE  05/24/2022   PAP-Cervical Cytology Screening  05/26/2022 (Originally 08/12/2020)   PAP SMEAR-Modifier  05/26/2022 (Originally 08/12/2020)   TETANUS/TDAP  05/26/2022 (Originally 04/02/2022)   HPV VACCINES  Discontinued   COVID-19 Vaccine  Discontinued    Discussed health benefits of physical activity, and encouraged her to engage in regular exercise appropriate for her age and condition.  Problem List Items Addressed This Visit   None Visit Diagnoses     Annual physical exam    -  Primary   Cervical cancer screening       Need for Tdap vaccination          No follow-ups on file.     Joy Jessup, NP   

## 2021-12-31 ENCOUNTER — Ambulatory Visit: Payer: Medicare Other | Admitting: Pulmonary Disease

## 2022-01-10 ENCOUNTER — Ambulatory Visit: Payer: Medicare Other | Admitting: Nurse Practitioner

## 2022-01-14 ENCOUNTER — Ambulatory Visit: Payer: Medicare Other | Admitting: Nurse Practitioner

## 2022-01-17 ENCOUNTER — Encounter: Payer: Self-pay | Admitting: Nurse Practitioner

## 2022-01-17 ENCOUNTER — Other Ambulatory Visit: Payer: Self-pay

## 2022-01-17 ENCOUNTER — Ambulatory Visit (INDEPENDENT_AMBULATORY_CARE_PROVIDER_SITE_OTHER): Payer: Medicare Other | Admitting: Nurse Practitioner

## 2022-01-17 ENCOUNTER — Ambulatory Visit (INDEPENDENT_AMBULATORY_CARE_PROVIDER_SITE_OTHER): Payer: Medicare Other

## 2022-01-17 VITALS — BP 138/80 | HR 103 | Ht 60.0 in | Wt 166.6 lb

## 2022-01-17 DIAGNOSIS — R058 Other specified cough: Secondary | ICD-10-CM

## 2022-01-17 DIAGNOSIS — J4541 Moderate persistent asthma with (acute) exacerbation: Secondary | ICD-10-CM

## 2022-01-17 DIAGNOSIS — I5022 Chronic systolic (congestive) heart failure: Secondary | ICD-10-CM

## 2022-01-17 DIAGNOSIS — J310 Chronic rhinitis: Secondary | ICD-10-CM | POA: Diagnosis not present

## 2022-01-17 MED ORDER — MONTELUKAST SODIUM 10 MG PO TABS: 10.0000 mg | ORAL_TABLET | Freq: Every day | ORAL | 5 refills | Status: DC

## 2022-01-17 MED ORDER — BREZTRI AEROSPHERE 160-9-4.8 MCG/ACT IN AERO: 2.0000 | INHALATION_SPRAY | Freq: Two times a day (BID) | RESPIRATORY_TRACT | 0 refills | Status: DC

## 2022-01-17 MED ORDER — PREDNISONE 10 MG PO TABS: ORAL_TABLET | ORAL | 0 refills | Status: DC

## 2022-01-17 MED ORDER — HYDROCOD POLI-CHLORPHE POLI ER 10-8 MG/5ML PO SUER: 5.0000 mL | Freq: Two times a day (BID) | ORAL | 0 refills | Status: DC | PRN

## 2022-01-17 MED ORDER — AZELASTINE HCL 0.1 % NA SOLN: 2.0000 | Freq: Two times a day (BID) | NASAL | 12 refills | Status: DC

## 2022-01-17 MED ORDER — DOXYCYCLINE HYCLATE 100 MG PO TABS: 100.0000 mg | ORAL_TABLET | Freq: Two times a day (BID) | ORAL | 0 refills | Status: AC

## 2022-01-17 MED ORDER — BREZTRI AEROSPHERE 160-9-4.8 MCG/ACT IN AERO: 2.0000 | INHALATION_SPRAY | Freq: Two times a day (BID) | RESPIRATORY_TRACT | 3 refills | Status: DC

## 2022-01-17 NOTE — Progress Notes (Signed)
? ?@Patient  ID: Stacey Lin, female    DOB: 04/06/1966, 56 y.o.   MRN: [id number] ? ?Chief Complaint  ?Patient presents with  ? Follow-up  ?  Cough   ? ? ?Referring provider: ?Grayce Sessions, NP ? ?HPI: ?56 year old female, former smoker (35 pack years) followed for asthma and upper airway cough syndrome. She is a patient of Dr. Thurston Hole and last seen in office on 03/05/2021. Past medical history significant for HTN, MVR, CHF, DM II, depression.  ? ?TEST/EVENTS:  ?08/16/2019 PFTs: FVC pre 1.31 (53), FEV1 pre 0.84 (43), ratio 64. Severe obstructive airway disease  ?03/05/2021 CXR 2 view: atherosclerosis. Lungs clear with no acute process.  ? ?03/05/2021: OV with Dr. Sherene Sires. Cough improved off entresto. Increased DOE. Advised to quit smoking. Depo inj for exacerbation. Stepped up to [facility] from Symbicort.  ? ?01/17/2022: Today - acute visit ?Patient presents today for persistent cough since having COVID last month. She reports that her cough has progressively worsened and become more violent. She is also coughing up white sputum now, which she wasn't before. She has some increased nasal drainage recently, which is clear. She has been more short of breath with exertion and has noticed more wheezing lately. She denies any swelling in her lower extremities, PND, orthopnea or chest pain. Denies any recent sick exposures or fevers. She continues on Symbicort; previously tried on Saxon but ran out. Overall, she's feeling terrible.  ? ?Allergies  ?Allergen Reactions  ? Ketoconazole Rash  ? ? ?Immunization History  ?Administered Date(s) Administered  ? Influenza,inj,Quad PF,6+ Mos 08/20/2013, 07/12/2018, 09/04/2019  ? Influenza-Unspecified 07/24/2014  ? Pneumococcal Polysaccharide-23 08/20/2013, 04/05/2016  ? Tdap 02/22/2018  ? ? ?Past Medical History:  ?Diagnosis Date  ? Anemia   ? Anginal pain (HCC)   ? 2 months  ? Arrhythmia   ? Asthma   ? CHF (congestive heart failure) (HCC)   ? 1990s  ? Chronic headaches   ? COPD  (chronic obstructive pulmonary disease) (HCC)   ? ??  ? Hypercholesteremia   ? Hypertension   ? ? ?Tobacco History: ?Social History  ? ?Tobacco Use  ?Smoking Status Former  ? Packs/day: 1.00  ? Years: 35.00  ? Pack years: 35.00  ? Types: Cigarettes  ? Quit date: 08/26/2019  ? Years since quitting: 2.3  ?Smokeless Tobacco Never  ? ?Counseling given: Not Answered ? ? ?Outpatient Medications Prior to Visit  ?Medication Sig Dispense Refill  ? ACCU-CHEK GUIDE test strip TEST THREE TIMES DAILY 300 strip 0  ? Accu-Chek Softclix Lancets lancets TEST BLOOD SUGAR THREE TIMES DAILY 300 each 0  ? acetaminophen (TYLENOL) 500 MG tablet Take 500 mg by mouth every 6 (six) hours as needed.    ? albuterol (VENTOLIN HFA) 108 (90 Base) MCG/ACT inhaler Inhale 2 puffs into the lungs every 6 (six) hours as needed for wheezing or shortness of breath. 1 each 5  ? allopurinol (ZYLOPRIM) 100 MG tablet TAKE 1 TABLET TWICE DAILY 180 tablet 0  ? ammonium lactate (AMLACTIN) 12 % lotion Apply 1 application topically 2 (two) times daily. 400 g 4  ? atorvastatin (LIPITOR) 40 MG tablet Take 1 tablet (40 mg total) by mouth daily. 90 tablet 3  ? benzonatate (TESSALON) 200 MG capsule Take 1 capsule (200 mg total) by mouth 2 (two) times daily as needed for cough. 20 capsule 0  ? Blood Glucose Monitoring Suppl (ACCU-CHEK AVIVA PLUS) w/Device KIT 1 each by Does not apply route 3 (three) times daily.  1 kit 0  ? carvedilol (COREG) 3.125 MG tablet TAKE 1 TABLET BY MOUTH TWICE DAILY 60 tablet 5  ? escitalopram (LEXAPRO) 20 MG tablet Take 1 tablet (20 mg total) by mouth daily. 90 tablet 1  ? famotidine (PEPCID) 20 MG tablet One after supper 30 tablet 11  ? fluticasone (FLONASE) 50 MCG/ACT nasal spray Place 2 sprays into both nostrils daily. As needed for nasal vongestion 16 g 6  ? furosemide (LASIX) 40 MG tablet TAKE 1 TABLET TWICE DAILY 180 tablet 0  ? gabapentin (NEURONTIN) 600 MG tablet Take 600 mg by mouth.    ? ketotifen (ALAWAY) 0.025 % ophthalmic solution  Place 1 drop into both eyes 2 (two) times daily. 5 mL 0  ? losartan (COZAAR) 25 MG tablet TAKE 1 TABLET (25 MG TOTAL) BY MOUTH DAILY. 30 tablet 0  ? metFORMIN (GLUCOPHAGE-XR) 500 MG 24 hr tablet TAKE 1 TABLETS ONCE A DAY 90 tablet 1  ? NARCAN 4 MG/0.1ML LIQD nasal spray kit NAR REP ALN  0  ? pantoprazole (PROTONIX) 40 MG tablet Take 1 tablet (40 mg total) by mouth daily. Take 30-60 min before first meal of the day 30 tablet 2  ? spironolactone (ALDACTONE) 25 MG tablet TAKE 1 TABLET DAILY 90 tablet 3  ? urea (GORDONS UREA) 40 % ointment Apply to rough skin on heels once daily. 30 g 2  ? Budeson-Glycopyrrol-Formoterol (BREZTRI AEROSPHERE) 160-9-4.8 MCG/ACT AERO Inhale 2 puffs into the lungs in the morning and at bedtime. 5.9 g 0  ? budesonide-formoterol (SYMBICORT) 80-4.5 MCG/ACT inhaler TAKE 2 PUFFS BY MOUTH FIRST THING IN MORNING THEN 2 PUFFS 12 HOURS LATER 10.2 g 5  ? budesonide-formoterol (SYMBICORT) 80-4.5 MCG/ACT inhaler TAKE 2 PUFFS BY MOUTH FIRST THING IN MORNING THEN 2 PUFFS 12 HOURS LATER 10.2 g 3  ? ?No facility-administered medications prior to visit.  ? ? ? ?Review of Systems:  ? ?Constitutional: No weight loss or gain, night sweats, fevers, chills. +fatigue ?HEENT: No headaches, difficulty swallowing, tooth/dental problems, or sore throat. No sneezing, itching, ear ache. +nasal congestion/drainage ?CV:  No chest pain, orthopnea, PND, swelling in lower extremities, anasarca, dizziness, palpitations, syncope ?Resp: +shortness of breath with exertion; productive cough (white sputum); occasional wheeze. No hemoptysis. No chest wall deformity ?GI:  No heartburn, indigestion, abdominal pain, nausea, vomiting, diarrhea, change in bowel habits, loss of appetite, bloody stools.  ?Skin: No rash, lesions, ulcerations ?Neuro: No dizziness or lightheadedness.  ?Psych: No depression or anxiety. Mood stable.  ? ? ? ?Physical Exam: ? ?BP 138/80 (BP Location: Right Arm, Cuff Size: Normal)   Pulse (!) 103   Ht 5' (1.524  m)   Wt 166 lb 9.6 oz (75.6 kg)   LMP  (LMP Unknown)   SpO2 97%   BMI 32.54 kg/m?  ? ?GEN: Pleasant, interactive, well-appearing; obese; in no acute distress. ?HEENT:  Normocephalic and atraumatic. EACs patent bilaterally. TM pearly gray with present light reflex bilaterally. PERRLA. Sclera white. Nasal turbinates erythematous, moist and patent bilaterally. Clear rhinorrhea present. Oropharynx erythematous and moist, without exudate or edema. No lesions, ulcerations  ?NECK:  Supple w/ fair ROM. No JVD present. Normal carotid impulses w/o bruits. Thyroid symmetrical with no goiter or nodules palpated. No lymphadenopathy.   ?CV: RRR, no m/r/g, no peripheral edema. Pulses intact, +2 bilaterally. No cyanosis, pallor or clubbing. ?PULMONARY:  Unlabored, regular breathing. Minimal scattered inspiratory wheeze bilaterally A&P. Congested, paroxysmal cough. No accessory muscle use. No dullness to percussion. ?GI: BS present and normoactive.  Soft, non-tender to palpation.  ?Neuro: A/Ox3. No focal deficits noted.   ?Skin: Warm, no lesions or rashe ?Psych: Normal affect and behavior. Judgement and thought content appropriate.  ? ? ? ?Lab Results: ? ?CBC ?   ?Component Value Date/Time  ? WBC 11.6 (H) 12/19/2018 1100  ? RBC 3.98 12/19/2018 1100  ? HGB 12.0 12/19/2018 1100  ? HGB 11.2 11/27/2018 1537  ? HCT 36.3 12/19/2018 1100  ? HCT 33.3 (L) 11/27/2018 1537  ? PLT 321.0 12/19/2018 1100  ? PLT 318 11/27/2018 1537  ? MCV 91.1 12/19/2018 1100  ? MCV 90 11/27/2018 1537  ? MCH 30.4 11/27/2018 1537  ? MCH 29.0 09/10/2018 1427  ? MCHC 33.0 12/19/2018 1100  ? RDW 13.1 12/19/2018 1100  ? RDW 12.4 11/27/2018 1537  ? LYMPHSABS 1.9 12/19/2018 1100  ? LYMPHSABS 2.0 11/27/2018 1537  ? MONOABS 0.5 12/19/2018 1100  ? EOSABS 0.5 12/19/2018 1100  ? EOSABS 0.4 11/27/2018 1537  ? BASOSABS 0.0 12/19/2018 1100  ? BASOSABS 0.1 11/27/2018 1537  ? ? ?BMET ?   ?Component Value Date/Time  ? NA 142 09/03/2019 1118  ? NA 143 11/27/2018 1537  ? K 4.0  09/03/2019 1118  ? CL 103 09/03/2019 1118  ? CO2 29 09/03/2019 1118  ? GLUCOSE 174 (H) 09/03/2019 1118  ? BUN 22 (H) 09/03/2019 1118  ? BUN 18 11/27/2018 1537  ? CREATININE 1.18 (H) 09/03/2019 1118  ? CREATININE

## 2022-01-17 NOTE — Assessment & Plan Note (Signed)
Appears compensated on exam. Plans to follow up with cardiology this week.  ?

## 2022-01-17 NOTE — Assessment & Plan Note (Signed)
Nasal congestion and drainage. Continue intranasal steroid. Add astelin and singulair.  ?

## 2022-01-17 NOTE — Patient Instructions (Addendum)
Continue Albuterol inhaler 2 puffs every 6 hours as needed for shortness of breath or wheezing. Notify if symptoms persist despite rescue inhaler/neb use. ?Continue tessalon perles 1 capsule Three times a day as needed for cough ?Continue pepcid 20 mg daily  ?Continue Flonase 2 sprays each nostril daily ?Continue protonix 40 mg daily  ? ?Stop Symbicort. Start Breztri 2 puffs Twice daily. Brush tongue and rinse mouth afterwards ?Doxycycline 100 mg Twice daily for 7 days. Take with food ?Prednisone taper. 4 tabs for 2 days, then 3 tabs for 2 days, 2 tabs for 2 days, then 1 tab for 2 days, then stop. Take in AM with food.  ?Mucinex 600 mg Twice daily for cough/congestion  ?Singulair 10 mg At bedtime  ? ?Frequent sips of water. Suck on sugar free hard candies throughout the day.  ? ?Chest x ray today. We will notify you of any abnormal results.  ? ?Follow up in one month with Dr. Melvyn Novas or Alanson Aly. If symptoms do not improve or worsen, please contact office for sooner follow up or seek emergency care. ?

## 2022-01-17 NOTE — Assessment & Plan Note (Addendum)
Acute exacerbation with UACS as well. Stable in office with no new O2 demand. CXR with clear lungs. Previously ran out of Sulphur and returned to taking Symbicort; will step back up to triple therapy. Samples provided and sent new rx. Prednisone taper and doxy course. Trigger prevention with singulair.  ? ?Patient Instructions  ?Continue Albuterol inhaler 2 puffs every 6 hours as needed for shortness of breath or wheezing. Notify if symptoms persist despite rescue inhaler/neb use. ?Continue tessalon perles 1 capsule Three times a day as needed for cough ?Continue pepcid 20 mg daily  ?Continue Flonase 2 sprays each nostril daily ?Continue protonix 40 mg daily  ? ?Stop Symbicort. Start Breztri 2 puffs Twice daily. Brush tongue and rinse mouth afterwards ?Doxycycline 100 mg Twice daily for 7 days. Take with food ?Prednisone taper. 4 tabs for 2 days, then 3 tabs for 2 days, 2 tabs for 2 days, then 1 tab for 2 days, then stop. Take in AM with food.  ?Mucinex 600 mg Twice daily for cough/congestion  ?Singulair 10 mg At bedtime  ? ?Frequent sips of water. Suck on sugar free hard candies throughout the day.  ? ?Chest x ray today. We will notify you of any abnormal results.  ? ?Follow up in one month with Dr. Melvyn Novas or Alanson Aly. If symptoms do not improve or worsen, please contact office for sooner follow up or seek emergency care. ? ? ?

## 2022-01-18 ENCOUNTER — Telehealth (HOSPITAL_COMMUNITY): Payer: Self-pay

## 2022-01-18 NOTE — Telephone Encounter (Signed)
Called to confirm/remind patient of their appointment at the [facility] on 01/19/22.  ? ?Patient reminded to bring all medications and/or complete list. ? ?Confirmed patient has transportation. Gave directions, instructed to utilize [place] parking. ? ?Confirmed appointment prior to ending call.  ? ?

## 2022-01-19 ENCOUNTER — Encounter (HOSPITAL_COMMUNITY): Payer: Medicare Other

## 2022-02-02 ENCOUNTER — Telehealth (INDEPENDENT_AMBULATORY_CARE_PROVIDER_SITE_OTHER): Payer: Self-pay | Admitting: Primary Care

## 2022-02-02 NOTE — Telephone Encounter (Signed)
Patient was transferred from [facility]. The patient refused information to the [facility] nurse because she wanted to speak to [place]. Message given to CMA to call patient because she is with a patient. ?

## 2022-02-07 ENCOUNTER — Encounter: Payer: Self-pay | Admitting: Podiatry

## 2022-02-07 ENCOUNTER — Ambulatory Visit (INDEPENDENT_AMBULATORY_CARE_PROVIDER_SITE_OTHER): Payer: Medicare Other | Admitting: Podiatry

## 2022-02-07 DIAGNOSIS — E119 Type 2 diabetes mellitus without complications: Secondary | ICD-10-CM

## 2022-02-07 DIAGNOSIS — L84 Corns and callosities: Secondary | ICD-10-CM

## 2022-02-07 NOTE — Progress Notes (Signed)
This patient presents to the office saying she is treated by Dr.  Adah Perl.  She says Dr.  Adah Perl treats the callus on her heel as well as callus on her right big toe.She has not been seen on over 8 months.  She presents for evaluation and treatment.  Patient is now diabetic. ? ?Vascular  Dorsalis pedis and posterior tibial pulses are palpable  B/L.  Capillary return  WNL.  Temperature gradient is  WNL.  Skin turgor  WNL ? ?Sensorium  Senn Weinstein monofilament wire  WNL. Normal tactile sensation. ? ?Nail Exam  Patient has normal nails with no evidence of bacterial or fungal infection. ? ?Orthopedic  Exam  Muscle tone and muscle strength  WNL.  No limitations of motion feet  B/L.  No crepitus or joint effusion noted.  Foot type is unremarkable and digits show no abnormalities.  Bony prominences are unremarkable. ? ?Skin  No open lesions.  Normal skin texture and turgor.  Pinch callus right hallux.  Heel callus plantarly. ? ?Callus  B/l. ? ?Debride callus with dremel tool.  Told her to use vaseline.  RTC 4 months ? ? ?Gardiner Barefoot DPM  ?

## 2022-02-17 ENCOUNTER — Ambulatory Visit: Payer: Medicare Other | Admitting: Nurse Practitioner

## 2022-02-18 ENCOUNTER — Ambulatory Visit: Payer: Medicare Other | Admitting: Nurse Practitioner

## 2022-03-02 ENCOUNTER — Ambulatory Visit (INDEPENDENT_AMBULATORY_CARE_PROVIDER_SITE_OTHER): Payer: Medicare Other | Admitting: Nurse Practitioner

## 2022-03-02 ENCOUNTER — Encounter: Payer: Self-pay | Admitting: Nurse Practitioner

## 2022-03-02 VITALS — BP 130/90 | HR 102 | Temp 99.0°F | Ht 60.0 in | Wt 161.8 lb

## 2022-03-02 DIAGNOSIS — J4541 Moderate persistent asthma with (acute) exacerbation: Secondary | ICD-10-CM

## 2022-03-02 DIAGNOSIS — F17200 Nicotine dependence, unspecified, uncomplicated: Secondary | ICD-10-CM | POA: Diagnosis not present

## 2022-03-02 DIAGNOSIS — R058 Other specified cough: Secondary | ICD-10-CM

## 2022-03-02 DIAGNOSIS — J4540 Moderate persistent asthma, uncomplicated: Secondary | ICD-10-CM | POA: Diagnosis not present

## 2022-03-02 LAB — POCT EXHALED NITRIC OXIDE: FeNO level (ppb): 12

## 2022-03-02 MED ORDER — PREDNISONE 10 MG PO TABS: ORAL_TABLET | ORAL | 0 refills | Status: DC

## 2022-03-02 MED ORDER — ALBUTEROL SULFATE (2.5 MG/3ML) 0.083% IN NEBU
2.5000 mg | INHALATION_SOLUTION | Freq: Once | RESPIRATORY_TRACT | Status: AC
  Administered 2022-03-02: 2.5 mg via RESPIRATORY_TRACT

## 2022-03-02 NOTE — Patient Instructions (Addendum)
Continue Breztri 2 puffs Twice daily. Brush tongue and rinse mouth afterwards ?Continue Albuterol inhaler 2 puffs every 6 hours as needed for shortness of breath or wheezing. Notify if symptoms persist despite rescue inhaler/neb use. ?Continue tessalon perles 1 capsule Three times a day as needed for cough ?Continue pepcid 20 mg daily  ?Continue Flonase 2 sprays each nostril daily ?Continue protonix 40 mg daily  ?Continue singulair 10 mg At bedtime  ? ?Prednisone taper. 4 tabs for 2 days, then 3 tabs for 2 days, 2 tabs for 2 days, then 1 tab for 2 days, then stop. Take in AM with food.  ?Mucinex 600 mg Twice daily for cough/congestion  ?Start Astelin nasal spray 2 sprays each nostril Twice daily  ?Chlorpheniramine tablets 4 mg over the counter At bedtime for cough  ?Delsym 2 tsp Twice daily for cough ?  ?Frequent sips of water. Suck on sugar free hard candies throughout the day.  ?  ?CT chest w contrast for further evaluation of your cough ?  ?Follow up after CT scan with Dr. Melvyn Novas or Alanson Aly. If symptoms do not improve or worsen, please contact office for sooner follow up or seek emergency care. ?

## 2022-03-02 NOTE — Assessment & Plan Note (Signed)
Previously had good improvement with prednisone.  Will treat with prednisone taper today.  Advised cough control measures and for her to start Astelin nasal spray twice a day for postnasal drainage control.  Continue trigger prevention with Singulair.  GERD well controlled on current regimen of Protonix and Pepcid. ?

## 2022-03-02 NOTE — Assessment & Plan Note (Signed)
No evidence of bronchospasm on exam and exhaled nitric oxide normal today.  Suspect cough primarily related to upper airway irritation.  Advised that she continue Breztri and use albuterol as needed. ? ?Patient Instructions  ?Continue Breztri 2 puffs Twice daily. Brush tongue and rinse mouth afterwards ?Continue Albuterol inhaler 2 puffs every 6 hours as needed for shortness of breath or wheezing. Notify if symptoms persist despite rescue inhaler/neb use. ?Continue tessalon perles 1 capsule Three times a day as needed for cough ?Continue pepcid 20 mg daily  ?Continue Flonase 2 sprays each nostril daily ?Continue protonix 40 mg daily  ?Continue singulair 10 mg At bedtime  ? ?Prednisone taper. 4 tabs for 2 days, then 3 tabs for 2 days, 2 tabs for 2 days, then 1 tab for 2 days, then stop. Take in AM with food.  ?Mucinex 600 mg Twice daily for cough/congestion  ?Start Astelin nasal spray 2 sprays each nostril Twice daily  ?Chlorpheniramine tablets 4 mg over the counter At bedtime for cough  ?Delsym 2 tsp Twice daily for cough ?  ?Frequent sips of water. Suck on sugar free hard candies throughout the day.  ?  ?CT chest w contrast for further evaluation of your cough ?  ?Follow up after CT scan with Dr. Melvyn Novas or Alanson Aly. If symptoms do not improve or worsen, please contact office for sooner follow up or seek emergency care. ? ? ?

## 2022-03-02 NOTE — Assessment & Plan Note (Signed)
Given significant smoking history, anorexia and chronic cough, will obtain CT chest w contrast for further evaluation.  ?

## 2022-03-02 NOTE — Progress Notes (Signed)
? ?@Patient  ID: Stacey Lin, female    DOB: October 01, 1966, 56 y.o.   MRN: [id number] ? ?Chief Complaint  ?Patient presents with  ? Acute Visit  ?  Cough with with mucous and sob for a week or so.  Temp is 99 today.  She thinks she may have had a fever.  ? ? ?Referring provider: ?Stacey Sessions, NP ? ?HPI: ?56 year old female, former smoker (35 pack years) followed for asthma/COPD and upper airway cough syndrome.  She is a patient Stacey Lin and last seen in office on 01/17/2022 by [facility] NP.  Past medical history significant for hypertension, MVR, CHF, DM 2, depression. ? ?TEST/EVENTS:  ?08/16/2019 Stacey Lin: FVC 1.31 (53, FEV0.84 (43), ratio 64.  Severe obstructive airway disease. ?01/17/2022 CXR 2 view: Both lungs are clear.  There is no acute cardiopulmonary disease. ? ?01/17/2022: OV with Stacey Foutz NP.  Persistent cough since having COVID last month.  Was initially dry and has now become productive with white sputum.  Also reported increased nasal drainage recently which was clear.  Fully she has been more short of breath with exertion and more wheezing.  Stepped up from Symbicort to [place] [employer].  Treated with doxycycline course and prednisone taper.  Suspected to be an AECOPD/asthma with upper airway irritation.  Cough control measures.  Advised trigger prevention with Singulair.  Initiate therapy with Astelin nasal spray. ? ?03/02/2022: Today-acute visit ?Patient presents today for worsening cough.  She was seen in late March with similar symptoms after having COVID in February.  Suspected to be upper airway irritation and asthma flare.  She was transition to triple therapy with Breztri and treated with Doxy and prednisone.  Her cough improved with the prednisone, but she reports that it never fully went away.  Around a week ago cough increased again.  Slight increase in shortness of breath, primarily with coughing spells. Also feels like her nasal congestion has picked back up as well.  She has not been using nasal sprays  as previously prescribed. She feels like she may have had a fever but her max temp is 99; suspected possibly r/t heat. She denies wheezing, fatigue, hemoptysis, lower extremity edema. She has had a decrease in her appetite over the last few months. Doesn't report any significant weight loss. She continues on Breztri Twice daily and singulair at bedtime. She is on pepcid and protonix for GERD control.  ? ?Allergies  ?Allergen Reactions  ? Ketoconazole Rash  ? ? ?Immunization History  ?Administered Date(s) Administered  ? Influenza,inj,Quad PF,6+ Mos 08/20/2013, 07/12/2018, 09/04/2019, 08/23/2020  ? Influenza-Unspecified 07/24/2014  ? Pneumococcal Polysaccharide-23 08/20/2013, 04/05/2016  ? Tdap 02/22/2018  ? ? ?Past Medical History:  ?Diagnosis Date  ? Anemia   ? Anginal pain (HCC)   ? 2 months  ? Arrhythmia   ? Asthma   ? CHF (congestive heart failure) (HCC)   ? 1990s  ? Chronic headaches   ? COPD (chronic obstructive pulmonary disease) (HCC)   ? ??  ? Hypercholesteremia   ? Hypertension   ? ? ?Tobacco History: ?Social History  ? ?Tobacco Use  ?Smoking Status Former  ? Packs/day: 1.00  ? Years: 35.00  ? Pack years: 35.00  ? Types: Cigarettes  ? Quit date: 08/26/2019  ? Years since quitting: 2.5  ?Smokeless Tobacco Never  ? ?Counseling given: Not Answered ? ? ?Outpatient Medications Prior to Visit  ?Medication Sig Dispense Refill  ? ACCU-CHEK GUIDE test strip TEST THREE TIMES DAILY 300 strip 0  ? Accu-Chek Softclix  Lancets lancets TEST BLOOD SUGAR THREE TIMES DAILY 300 each 0  ? acetaminophen (TYLENOL) 500 MG tablet Take 500 mg by mouth every 6 (six) hours as needed.    ? albuterol (VENTOLIN HFA) 108 (90 Base) MCG/ACT inhaler Inhale 2 puffs into the lungs every 6 (six) hours as needed for wheezing or shortness of breath. 1 each 5  ? allopurinol (ZYLOPRIM) 100 MG tablet TAKE 1 TABLET TWICE DAILY 180 tablet 0  ? ammonium lactate (AMLACTIN) 12 % lotion Apply 1 application topically 2 (two) times daily. 400 g 4  ?  atorvastatin (LIPITOR) 40 MG tablet Take 1 tablet (40 mg total) by mouth daily. 90 tablet 3  ? azelastine (ASTELIN) 0.1 % nasal spray Place 2 sprays into both nostrils 2 (two) times daily. Use in each nostril as directed 30 mL 12  ? Blood Glucose Monitoring Suppl (ACCU-CHEK AVIVA PLUS) w/Device KIT 1 each by Does not apply route 3 (three) times daily. 1 kit 0  ? Budeson-Glycopyrrol-Formoterol (BREZTRI AEROSPHERE) 160-9-4.8 MCG/ACT AERO Inhale 2 puffs into the lungs in the morning and at bedtime. 10.7 g 3  ? Budeson-Glycopyrrol-Formoterol (BREZTRI AEROSPHERE) 160-9-4.8 MCG/ACT AERO Inhale 2 puffs into the lungs in the morning and at bedtime. 5.9 g 0  ? carvedilol (COREG) 3.125 MG tablet TAKE 1 TABLET BY MOUTH TWICE DAILY 60 tablet 5  ? chlorpheniramine-HYDROcodone (TUSSIONEX PENNKINETIC ER) 10-8 MG/5ML Take 5 mLs by mouth every 12 (twelve) hours as needed for cough. 70 mL 0  ? escitalopram (LEXAPRO) 20 MG tablet Take 1 tablet (20 mg total) by mouth daily. 90 tablet 1  ? famotidine (PEPCID) 20 MG tablet One after supper 30 tablet 11  ? fluticasone (FLONASE) 50 MCG/ACT nasal spray Place 2 sprays into both nostrils daily. As needed for nasal vongestion 16 g 6  ? furosemide (LASIX) 40 MG tablet TAKE 1 TABLET TWICE DAILY 180 tablet 0  ? gabapentin (NEURONTIN) 600 MG tablet Take 600 mg by mouth.    ? ketotifen (ALAWAY) 0.025 % ophthalmic solution Place 1 drop into both eyes 2 (two) times daily. 5 mL 0  ? losartan (COZAAR) 25 MG tablet TAKE 1 TABLET (25 MG TOTAL) BY MOUTH DAILY. 30 tablet 0  ? metFORMIN (GLUCOPHAGE-XR) 500 MG 24 hr tablet TAKE 1 TABLETS ONCE A DAY 90 tablet 1  ? montelukast (SINGULAIR) 10 MG tablet Take 1 tablet (10 mg total) by mouth at bedtime. 30 tablet 5  ? NARCAN 4 MG/0.1ML LIQD nasal spray kit NAR REP ALN  0  ? pantoprazole (PROTONIX) 40 MG tablet Take 1 tablet (40 mg total) by mouth daily. Take 30-60 min before first meal of the day 30 tablet 2  ? spironolactone (ALDACTONE) 25 MG tablet TAKE 1 TABLET  DAILY 90 tablet 3  ? urea (GORDONS UREA) 40 % ointment Apply to rough skin on heels once daily. 30 g 2  ? benzonatate (TESSALON) 200 MG capsule Take 1 capsule (200 mg total) by mouth 2 (two) times daily as needed for cough. (Patient not taking: Reported on 03/02/2022) 20 capsule 0  ? predniSONE (DELTASONE) 10 MG tablet 4 tabs for 2 days, then 3 tabs for 2 days, 2 tabs for 2 days, then 1 tab for 2 days, then stop (Patient not taking: Reported on 03/02/2022) 20 tablet 0  ? ?No facility-administered medications prior to visit.  ? ? ? ?Review of Systems:  ? ?Constitutional: No weight loss or gain, night sweats, fevers, chills, fatigue, or lassitude. ?HEENT: No headaches, difficulty swallowing,  tooth/dental problems, or sore throat. No sneezing, itching, ear ache. +postnasal drainage ?CV:  No chest pain, orthopnea, PND, swelling in lower extremities, anasarca, dizziness, palpitations, syncope ?Resp: +cough, minimally productive; shortness of breath with exertion/coughing spells. No excess mucus or change in color of mucus. No hemoptysis. No wheezing.  No chest wall deformity ?GI:  +decreased appetite. No heartburn, indigestion, abdominal pain, nausea, vomiting, diarrhea, change in bowel habits, bloody stools.  ?GU: No dysuria, change in color of urine, urgency or frequency.  No flank pain, no hematuria  ?Skin: No rash, lesions, ulcerations ?MSK:  No joint pain or swelling.  No decreased range of motion.  No back pain. ?Neuro: No dizziness or lightheadedness.  ?Psych: No depression or anxiety. Mood stable.  ? ? ? ?Physical Exam: ? ?BP 130/90 (BP Location: Right Arm, Patient Position: Sitting, Cuff Size: Large)   Pulse (!) 102   Temp 99 ?F (37.2 ?C) (Oral)   Ht 5' (1.524 m)   Wt 161 lb 12.8 oz (73.4 kg)   LMP  (LMP Unknown)   SpO2 98%   BMI 31.60 kg/m?  ? ?GEN: Pleasant, interactive, well-appearing; obese; in no acute distress. ?HEENT:  Normocephalic and atraumatic. PERRLA. Sclera white. Nasal turbinates erythematous,  moist and patent bilaterally. Clear rhinorrhea present. Oropharynx pink and moist, without exudate or edema. No lesions, ulcerations, or postnasal drip.  ?NECK:  Supple w/ fair ROM. No JVD present. Normal carotid

## 2022-03-11 ENCOUNTER — Ambulatory Visit (HOSPITAL_COMMUNITY): Payer: Medicare Other | Attending: Nurse Practitioner

## 2022-03-17 ENCOUNTER — Ambulatory Visit (HOSPITAL_COMMUNITY)
Admission: RE | Admit: 2022-03-17 | Discharge: 2022-03-17 | Disposition: A | Discharge status: Home / Self Care | Payer: Medicare Other | Source: Ambulatory Visit | Attending: Nurse Practitioner | Admitting: Nurse Practitioner

## 2022-03-17 DIAGNOSIS — F17200 Nicotine dependence, unspecified, uncomplicated: Secondary | ICD-10-CM | POA: Diagnosis present

## 2022-03-17 DIAGNOSIS — R053 Chronic cough: Secondary | ICD-10-CM | POA: Insufficient documentation

## 2022-03-17 DIAGNOSIS — E119 Type 2 diabetes mellitus without complications: Secondary | ICD-10-CM | POA: Diagnosis present

## 2022-03-17 LAB — POCT I-STAT CREATININE: Creatinine, Ser: 1.1 mg/dL — ABNORMAL HIGH (ref 0.44–1.00)

## 2022-03-17 MED ORDER — IOHEXOL 300 MG/ML  SOLN
100.0000 mL | Freq: Once | INTRAMUSCULAR | Status: AC | PRN
  Administered 2022-03-17: 70 mL via INTRAVENOUS

## 2022-03-18 NOTE — Telephone Encounter (Signed)
Stacey Lin, pt is requesting results of her lab work. She has been informed that she may not get results until early next week. Thanks.

## 2022-03-18 NOTE — Telephone Encounter (Signed)
Waiting for [facility] reply to patients test results!

## 2022-03-18 NOTE — Telephone Encounter (Signed)
Is she asking about her creatinine or her CT scan results? I do not have CT scan results. Her creatinine was elevated indicating she has some mild kidney dysfunction, but overall had decreased from her previous labs in 2020 which was good and means this has worsened.

## 2022-03-19 ENCOUNTER — Encounter: Payer: Self-pay | Admitting: Podiatry

## 2022-03-22 NOTE — Progress Notes (Signed)
Please notify patient that her CT scan showed primarily clear lungs. Nothing concerning from a pulmonary standpoint. Her heart is significantly enlarged, which given her history is not completed unexpected; however, I want her to follow up with cardiology as scheduled and mention this. I think it would be a good idea to repeat her echocardiogram given her symptoms and that she hasn't had one since 2019. Thanks!

## 2022-03-24 ENCOUNTER — Telehealth (HOSPITAL_COMMUNITY): Payer: Self-pay

## 2022-03-24 NOTE — Telephone Encounter (Signed)
Called and was unable to leave a voice message to confirm/remind patient of their appointment at the [facility] on 03/25/22.

## 2022-03-25 ENCOUNTER — Encounter (HOSPITAL_COMMUNITY): Payer: Medicare Other

## 2022-04-04 ENCOUNTER — Ambulatory Visit (INDEPENDENT_AMBULATORY_CARE_PROVIDER_SITE_OTHER): Payer: Self-pay | Admitting: *Deleted

## 2022-04-04 NOTE — Telephone Encounter (Signed)
Noted. Routed to PCP as FYI.

## 2022-04-04 NOTE — Telephone Encounter (Signed)
Reason for Disposition  Difficulty breathing at rest    Both legs swollen up to knees and very short of breath  Answer Assessment - Initial Assessment Questions 1. ONSET: "When did the swelling start?" (e.g., minutes, hours, days)     Both feet are swollen and up to knees.   I'm short of breath.  I have leaking valves in my heart and I think I have fluid build up.  2. LOCATION: "What part of the leg is swollen?"  "Are both legs swollen or just one leg?"     My feet swelled up 3-4 days ago.   Shortness of breath going on for a while.  (She is talking in short phrases). I've been off my medications including my fluid pill.    3. SEVERITY: "How bad is the swelling?" (e.g., localized; mild, moderate, severe)  - Localized - small area of swelling localized to one leg  - MILD pedal edema - swelling limited to foot and ankle, pitting edema < 1/4 inch (6 mm) deep, rest and elevation eliminate most or all swelling  - MODERATE edema - swelling of lower leg to knee, pitting edema > 1/4 inch (6 mm) deep, rest and elevation only partially reduce swelling  - SEVERE edema - swelling extends above knee, facial or hand swelling present      Severe  very short of breath  I encouraged her to go to the ED.   She was agreeable and said she had someone who could take her to [facility]   She was agreeable to calling 911 after she got her clothes on. 4. REDNESS: "Does the swelling look red or infected?"     Not asked at this point instructed her to go to the ED. 5. PAIN: "Is the swelling painful to touch?" If Yes, ask: "How painful is it?"   (Scale 1-10; mild, moderate or severe)     Very swollen "My feet all the way up to my knees". 6. FEVER: "Do you have a fever?" If Yes, ask: "What is it, how was it measured, and when did it start?"      Not asked 7. CAUSE: "What do you think is causing the leg swelling?"     I have fluid build up.   I've been off my medications for a while including my fluid pill.   8.  MEDICAL HISTORY: "Do you have a history of heart failure, kidney disease, liver failure, or cancer?"     I have leaking valves in my heart and fluid build up. 9. RECURRENT SYMPTOM: "Have you had leg swelling before?" If Yes, ask: "When was the last time?" "What happened that time?"     Yes but this time it's really bad. 10. OTHER SYMPTOMS: "Do you have any other symptoms?" (e.g., chest pain, difficulty breathing)       Very short of breath 11. PREGNANCY: "Is there any chance you are pregnant?" "When was your last menstrual period?"       N/A  Protocols used: Leg Swelling and Edema-A-AH

## 2022-04-04 NOTE — Telephone Encounter (Addendum)
  Chief Complaint: Both feet and legs swollen up to her knees.   Very short of breath.   Been off her medications for a while Symptoms: Talking in phrases.   The swelling in her legs "Is really bad this time" Frequency: worse over the last 3-4 days. Pertinent Negatives: Patient denies being on her medications   I've been off of them for a while Disposition: '[x]'$ ED /'[]'$ Urgent Care (no appt availability in office) / '[]'$ Appointment(In office/virtual)/ '[]'$  [facility]/ '[]'$ Home Care/ '[]'$ Refused Recommended Disposition /'[]'$ [facility]/ '[]'$  Follow-up with PCP Additional Notes: Pt agreeable to calling 911.   At first she was going to call someone to come take her but She finally agreed to call 911 after getting some clothes on.  Going to [facility].   I called into [facility] and spoke with [place] who is going to let [facility] know.

## 2022-04-06 ENCOUNTER — Inpatient Hospital Stay (HOSPITAL_COMMUNITY)
Admission: EM | Admit: 2022-04-06 | Discharge: 2022-04-13 | DRG: 286 | Disposition: A | Discharge status: Home / Self Care | Payer: Medicare Other | Attending: Family Medicine | Admitting: Family Medicine

## 2022-04-06 ENCOUNTER — Encounter (HOSPITAL_COMMUNITY): Payer: Self-pay

## 2022-04-06 ENCOUNTER — Other Ambulatory Visit: Payer: Self-pay

## 2022-04-06 ENCOUNTER — Emergency Department (HOSPITAL_COMMUNITY): Payer: Medicare Other

## 2022-04-06 DIAGNOSIS — R519 Headache, unspecified: Secondary | ICD-10-CM | POA: Diagnosis present

## 2022-04-06 DIAGNOSIS — F1410 Cocaine abuse, uncomplicated: Secondary | ICD-10-CM | POA: Diagnosis present

## 2022-04-06 DIAGNOSIS — J449 Chronic obstructive pulmonary disease, unspecified: Secondary | ICD-10-CM | POA: Diagnosis present

## 2022-04-06 DIAGNOSIS — F32A Depression, unspecified: Secondary | ICD-10-CM | POA: Diagnosis present

## 2022-04-06 DIAGNOSIS — Z6829 Body mass index (BMI) 29.0-29.9, adult: Secondary | ICD-10-CM | POA: Diagnosis not present

## 2022-04-06 DIAGNOSIS — I5021 Acute systolic (congestive) heart failure: Secondary | ICD-10-CM | POA: Diagnosis present

## 2022-04-06 DIAGNOSIS — I10 Essential (primary) hypertension: Secondary | ICD-10-CM | POA: Diagnosis present

## 2022-04-06 DIAGNOSIS — F17210 Nicotine dependence, cigarettes, uncomplicated: Secondary | ICD-10-CM | POA: Diagnosis present

## 2022-04-06 DIAGNOSIS — E7800 Pure hypercholesterolemia, unspecified: Secondary | ICD-10-CM | POA: Diagnosis present

## 2022-04-06 DIAGNOSIS — I428 Other cardiomyopathies: Secondary | ICD-10-CM | POA: Diagnosis present

## 2022-04-06 DIAGNOSIS — R0603 Acute respiratory distress: Secondary | ICD-10-CM | POA: Diagnosis present

## 2022-04-06 DIAGNOSIS — I5043 Acute on chronic combined systolic (congestive) and diastolic (congestive) heart failure: Principal | ICD-10-CM

## 2022-04-06 DIAGNOSIS — I110 Hypertensive heart disease with heart failure: Secondary | ICD-10-CM | POA: Diagnosis present

## 2022-04-06 DIAGNOSIS — I272 Pulmonary hypertension, unspecified: Secondary | ICD-10-CM | POA: Diagnosis present

## 2022-04-06 DIAGNOSIS — Z888 Allergy status to other drugs, medicaments and biological substances status: Secondary | ICD-10-CM | POA: Diagnosis not present

## 2022-04-06 DIAGNOSIS — Z7984 Long term (current) use of oral hypoglycemic drugs: Secondary | ICD-10-CM

## 2022-04-06 DIAGNOSIS — G9341 Metabolic encephalopathy: Secondary | ICD-10-CM | POA: Diagnosis present

## 2022-04-06 DIAGNOSIS — R778 Other specified abnormalities of plasma proteins: Secondary | ICD-10-CM | POA: Diagnosis not present

## 2022-04-06 DIAGNOSIS — E1165 Type 2 diabetes mellitus with hyperglycemia: Secondary | ICD-10-CM | POA: Diagnosis present

## 2022-04-06 DIAGNOSIS — R4589 Other symptoms and signs involving emotional state: Secondary | ICD-10-CM | POA: Diagnosis not present

## 2022-04-06 DIAGNOSIS — Z8249 Family history of ischemic heart disease and other diseases of the circulatory system: Secondary | ICD-10-CM

## 2022-04-06 DIAGNOSIS — E663 Overweight: Secondary | ICD-10-CM | POA: Diagnosis present

## 2022-04-06 DIAGNOSIS — Z79899 Other long term (current) drug therapy: Secondary | ICD-10-CM

## 2022-04-06 DIAGNOSIS — I351 Nonrheumatic aortic (valve) insufficiency: Secondary | ICD-10-CM

## 2022-04-06 DIAGNOSIS — Z7951 Long term (current) use of inhaled steroids: Secondary | ICD-10-CM | POA: Diagnosis not present

## 2022-04-06 DIAGNOSIS — E8809 Other disorders of plasma-protein metabolism, not elsewhere classified: Secondary | ICD-10-CM | POA: Diagnosis present

## 2022-04-06 DIAGNOSIS — F1910 Other psychoactive substance abuse, uncomplicated: Secondary | ICD-10-CM | POA: Diagnosis present

## 2022-04-06 DIAGNOSIS — J9601 Acute respiratory failure with hypoxia: Secondary | ICD-10-CM | POA: Diagnosis present

## 2022-04-06 DIAGNOSIS — Z20822 Contact with and (suspected) exposure to covid-19: Secondary | ICD-10-CM | POA: Diagnosis present

## 2022-04-06 DIAGNOSIS — R7989 Other specified abnormal findings of blood chemistry: Secondary | ICD-10-CM | POA: Diagnosis present

## 2022-04-06 DIAGNOSIS — I340 Nonrheumatic mitral (valve) insufficiency: Secondary | ICD-10-CM | POA: Diagnosis not present

## 2022-04-06 DIAGNOSIS — E669 Obesity, unspecified: Secondary | ICD-10-CM

## 2022-04-06 DIAGNOSIS — I509 Heart failure, unspecified: Secondary | ICD-10-CM | POA: Diagnosis not present

## 2022-04-06 DIAGNOSIS — R7401 Elevation of levels of liver transaminase levels: Secondary | ICD-10-CM | POA: Diagnosis present

## 2022-04-06 DIAGNOSIS — Z825 Family history of asthma and other chronic lower respiratory diseases: Secondary | ICD-10-CM | POA: Diagnosis not present

## 2022-04-06 DIAGNOSIS — Z91148 Patient's other noncompliance with medication regimen for other reason: Secondary | ICD-10-CM

## 2022-04-06 DIAGNOSIS — I083 Combined rheumatic disorders of mitral, aortic and tricuspid valves: Secondary | ICD-10-CM | POA: Diagnosis present

## 2022-04-06 DIAGNOSIS — I5033 Acute on chronic diastolic (congestive) heart failure: Secondary | ICD-10-CM | POA: Diagnosis not present

## 2022-04-06 LAB — I-STAT VENOUS BLOOD GAS, ED
Acid-base deficit: 3 mmol/L — ABNORMAL HIGH (ref 0.0–2.0)
Bicarbonate: 22.7 mmol/L (ref 20.0–28.0)
Calcium, Ion: 1.17 mmol/L (ref 1.15–1.40)
HCT: 43 % (ref 36.0–46.0)
Hemoglobin: 14.6 g/dL (ref 12.0–15.0)
O2 Saturation: 100 %
Potassium: 4.1 mmol/L (ref 3.5–5.1)
Sodium: 139 mmol/L (ref 135–145)
TCO2: 24 mmol/L (ref 22–32)
pCO2, Ven: 42.7 mmHg — ABNORMAL LOW (ref 44–60)
pH, Ven: 7.333 (ref 7.25–7.43)
pO2, Ven: 205 mmHg — ABNORMAL HIGH (ref 32–45)

## 2022-04-06 LAB — CBC
HCT: 44.2 % (ref 36.0–46.0)
Hemoglobin: 13.8 g/dL (ref 12.0–15.0)
MCH: 31.1 pg (ref 26.0–34.0)
MCHC: 31.2 g/dL (ref 30.0–36.0)
MCV: 99.5 fL (ref 80.0–100.0)
Platelets: 281 10*3/uL (ref 150–400)
RBC: 4.44 MIL/uL (ref 3.87–5.11)
RDW: 13.7 % (ref 11.5–15.5)
WBC: 10 10*3/uL (ref 4.0–10.5)
nRBC: 0 % (ref 0.0–0.2)

## 2022-04-06 LAB — BASIC METABOLIC PANEL
Anion gap: 11 (ref 5–15)
BUN: 21 mg/dL — ABNORMAL HIGH (ref 6–20)
CO2: 19 mmol/L — ABNORMAL LOW (ref 22–32)
Calcium: 8.7 mg/dL — ABNORMAL LOW (ref 8.9–10.3)
Chloride: 107 mmol/L (ref 98–111)
Creatinine, Ser: 1.04 mg/dL — ABNORMAL HIGH (ref 0.44–1.00)
GFR, Estimated: >60 mL/min (ref >60)
Glucose, Bld: 285 mg/dL — ABNORMAL HIGH (ref 70–99)
Potassium: 4.2 mmol/L (ref 3.5–5.1)
Sodium: 137 mmol/L (ref 135–145)

## 2022-04-06 LAB — I-STAT CHEM 8, ED
BUN: 23 mg/dL — ABNORMAL HIGH (ref 6–20)
Calcium, Ion: 1.14 mmol/L — ABNORMAL LOW (ref 1.15–1.40)
Chloride: 108 mmol/L (ref 98–111)
Creatinine, Ser: 0.9 mg/dL (ref 0.44–1.00)
Glucose, Bld: 285 mg/dL — ABNORMAL HIGH (ref 70–99)
HCT: 44 % (ref 36.0–46.0)
Hemoglobin: 15 g/dL (ref 12.0–15.0)
Potassium: 4.1 mmol/L (ref 3.5–5.1)
Sodium: 140 mmol/L (ref 135–145)
TCO2: 24 mmol/L (ref 22–32)

## 2022-04-06 LAB — HEPATIC FUNCTION PANEL
ALT: 75 U/L — ABNORMAL HIGH (ref 0–44)
AST: 74 U/L — ABNORMAL HIGH (ref 15–41)
Albumin: 2.6 g/dL — ABNORMAL LOW (ref 3.5–5.0)
Alkaline Phosphatase: 89 U/L (ref 38–126)
Bilirubin, Direct: 0.2 mg/dL (ref 0.0–0.2)
Indirect Bilirubin: 0.5 mg/dL (ref 0.3–0.9)
Total Bilirubin: 0.7 mg/dL (ref 0.3–1.2)
Total Protein: 4.7 g/dL — ABNORMAL LOW (ref 6.5–8.1)

## 2022-04-06 LAB — TSH: TSH: 0.459 u[IU]/mL (ref 0.350–4.500)

## 2022-04-06 LAB — GLUCOSE, CAPILLARY
Glucose-Capillary: 380 mg/dL — ABNORMAL HIGH (ref 70–99)
Glucose-Capillary: 303 mg/dL — ABNORMAL HIGH (ref 70–99)

## 2022-04-06 LAB — PREALBUMIN: Prealbumin: 23.1 mg/dL (ref 18–38)

## 2022-04-06 LAB — RESP PANEL BY RT-PCR (FLU A&B, COVID) ARPGX2
Influenza A by PCR: NEGATIVE
Influenza B by PCR: NEGATIVE
SARS Coronavirus 2 by RT PCR: NEGATIVE

## 2022-04-06 LAB — HIV ANTIBODY (ROUTINE TESTING W REFLEX): HIV Screen 4th Generation wRfx: NONREACTIVE

## 2022-04-06 LAB — BLOOD GAS, VENOUS
Acid-Base Excess: 3.2 mmol/L — ABNORMAL HIGH (ref 0.0–2.0)
Bicarbonate: 29.9 mmol/L — ABNORMAL HIGH (ref 20.0–28.0)
O2 Saturation: 47.9 %
Patient temperature: 36.3
pCO2, Ven: 51 mmHg (ref 44–60)
pH, Ven: 7.37 (ref 7.25–7.43)
pO2, Ven: 32 mmHg (ref 32–45)

## 2022-04-06 LAB — I-STAT BETA HCG BLOOD, ED (MC, WL, AP ONLY): I-stat hCG, quantitative: <5 m[IU]/mL (ref <5)

## 2022-04-06 LAB — MAGNESIUM: Magnesium: 1.9 mg/dL (ref 1.7–2.4)

## 2022-04-06 LAB — D-DIMER, QUANTITATIVE: D-Dimer, Quant: 0.56 ug/mL-FEU — ABNORMAL HIGH (ref 0.00–0.50)

## 2022-04-06 LAB — TROPONIN I (HIGH SENSITIVITY)
Troponin I (High Sensitivity): 46 ng/L — ABNORMAL HIGH (ref <18)
Troponin I (High Sensitivity): 43 ng/L — ABNORMAL HIGH (ref <18)

## 2022-04-06 LAB — BRAIN NATRIURETIC PEPTIDE: B Natriuretic Peptide: 1508.2 pg/mL — ABNORMAL HIGH (ref 0.0–100.0)

## 2022-04-06 MED ORDER — BUDESON-GLYCOPYRROL-FORMOTEROL 160-9-4.8 MCG/ACT IN AERO: 2.0000 | INHALATION_SPRAY | Freq: Two times a day (BID) | RESPIRATORY_TRACT | Status: DC

## 2022-04-06 MED ORDER — UMECLIDINIUM BROMIDE 62.5 MCG/ACT IN AEPB
1.0000 | INHALATION_SPRAY | Freq: Every day | RESPIRATORY_TRACT | Status: DC
  Administered 2022-04-06 – 2022-04-07 (×2): 1 via RESPIRATORY_TRACT
  Filled 2022-04-06: qty 7

## 2022-04-06 MED ORDER — INSULIN ASPART 100 UNIT/ML IJ SOLN
0.0000 [IU] | INTRAMUSCULAR | Status: DC
  Administered 2022-04-06: 11 [IU] via SUBCUTANEOUS
  Administered 2022-04-06: 15 [IU] via SUBCUTANEOUS
  Administered 2022-04-07: 2 [IU] via SUBCUTANEOUS
  Administered 2022-04-07 (×2): 3 [IU] via SUBCUTANEOUS
  Administered 2022-04-07: 2 [IU] via SUBCUTANEOUS
  Administered 2022-04-07: 3 [IU] via SUBCUTANEOUS
  Administered 2022-04-07: 5 [IU] via SUBCUTANEOUS
  Administered 2022-04-08 (×4): 3 [IU] via SUBCUTANEOUS
  Administered 2022-04-08: 2 [IU] via SUBCUTANEOUS
  Administered 2022-04-09 (×2): 3 [IU] via SUBCUTANEOUS
  Administered 2022-04-09: 5 [IU] via SUBCUTANEOUS
  Administered 2022-04-09 – 2022-04-10 (×2): 3 [IU] via SUBCUTANEOUS

## 2022-04-06 MED ORDER — DAPAGLIFLOZIN PROPANEDIOL 10 MG PO TABS
10.0000 mg | ORAL_TABLET | Freq: Every day | ORAL | Status: DC
  Administered 2022-04-06 – 2022-04-13 (×8): 10 mg via ORAL
  Filled 2022-04-06 (×8): qty 1

## 2022-04-06 MED ORDER — ALBUTEROL SULFATE (2.5 MG/3ML) 0.083% IN NEBU
2.5000 mg | INHALATION_SOLUTION | RESPIRATORY_TRACT | Status: DC | PRN
  Administered 2022-04-10: 2.5 mg via RESPIRATORY_TRACT
  Filled 2022-04-06 (×2): qty 3

## 2022-04-06 MED ORDER — LORAZEPAM 2 MG/ML IJ SOLN
0.5000 mg | Freq: Four times a day (QID) | INTRAMUSCULAR | Status: DC | PRN
  Administered 2022-04-06 – 2022-04-10 (×5): 0.5 mg via INTRAVENOUS
  Filled 2022-04-06 (×5): qty 1

## 2022-04-06 MED ORDER — METHYLPREDNISOLONE SODIUM SUCC 125 MG IJ SOLR
125.0000 mg | Freq: Once | INTRAMUSCULAR | Status: AC
  Administered 2022-04-06: 125 mg via INTRAVENOUS
  Filled 2022-04-06: qty 2

## 2022-04-06 MED ORDER — FUROSEMIDE 10 MG/ML IJ SOLN
40.0000 mg | Freq: Once | INTRAMUSCULAR | Status: AC
  Administered 2022-04-06: 40 mg via INTRAVENOUS
  Filled 2022-04-06: qty 4

## 2022-04-06 MED ORDER — FLUTICASONE FUROATE-VILANTEROL 200-25 MCG/ACT IN AEPB
1.0000 | INHALATION_SPRAY | Freq: Every day | RESPIRATORY_TRACT | Status: DC
  Administered 2022-04-06 – 2022-04-07 (×2): 1 via RESPIRATORY_TRACT
  Filled 2022-04-06: qty 28

## 2022-04-06 MED ORDER — SODIUM CHLORIDE 0.9% FLUSH
3.0000 mL | Freq: Two times a day (BID) | INTRAVENOUS | Status: DC
  Administered 2022-04-06 – 2022-04-09 (×8): 3 mL via INTRAVENOUS

## 2022-04-06 MED ORDER — ENOXAPARIN SODIUM 40 MG/0.4ML IJ SOSY
40.0000 mg | PREFILLED_SYRINGE | Freq: Every day | INTRAMUSCULAR | Status: DC
  Administered 2022-04-07 – 2022-04-13 (×7): 40 mg via SUBCUTANEOUS
  Filled 2022-04-06 (×8): qty 0.4

## 2022-04-06 MED ORDER — ACETAMINOPHEN 325 MG PO TABS
650.0000 mg | ORAL_TABLET | Freq: Four times a day (QID) | ORAL | Status: DC | PRN
  Administered 2022-04-06 – 2022-04-11 (×4): 650 mg via ORAL
  Filled 2022-04-06 (×4): qty 2

## 2022-04-06 MED ORDER — ACETAMINOPHEN 650 MG RE SUPP: 650.0000 mg | Freq: Four times a day (QID) | RECTAL | Status: DC | PRN

## 2022-04-06 MED ORDER — FUROSEMIDE 10 MG/ML IJ SOLN
40.0000 mg | Freq: Two times a day (BID) | INTRAMUSCULAR | Status: DC
  Administered 2022-04-06: 40 mg via INTRAVENOUS
  Filled 2022-04-06: qty 4

## 2022-04-06 MED ORDER — ALBUTEROL SULFATE (2.5 MG/3ML) 0.083% IN NEBU
2.5000 mg | INHALATION_SOLUTION | Freq: Two times a day (BID) | RESPIRATORY_TRACT | Status: DC
  Administered 2022-04-06 – 2022-04-08 (×4): 2.5 mg via RESPIRATORY_TRACT
  Filled 2022-04-06 (×5): qty 3

## 2022-04-06 MED ORDER — IPRATROPIUM-ALBUTEROL 0.5-2.5 (3) MG/3ML IN SOLN
3.0000 mL | Freq: Once | RESPIRATORY_TRACT | Status: AC
  Administered 2022-04-06: 3 mL via RESPIRATORY_TRACT
  Filled 2022-04-06: qty 3

## 2022-04-06 MED ORDER — HYDRALAZINE HCL 20 MG/ML IJ SOLN: 10.0000 mg | INTRAMUSCULAR | Status: DC | PRN

## 2022-04-06 NOTE — ED Notes (Signed)
Pt refusing covid swab at this time

## 2022-04-06 NOTE — H&P (Addendum)
History and Physical    Patient: Stacey Lin WUJ:[id number] DOB: 1966/10/15 DOA: 04/06/2022 DOS: the patient was seen and examined on 04/06/2022 PCP: Grayce Sessions, NP  Patient coming from: Home  Chief Complaint:  Chief Complaint  Patient presents with   Shortness of Breath   HPI: Stacey Lin is a 56 y.o. female with medical history significant of hypertension, hyperlipidemia, congestive heart failure, diabetes mellitus type 2, COPD, and prior tobacco abuse who presents with complaints of shortness of breath progressively worsening over the last 2 weeks.  She complains of having progressively worsening swelling of her feet to the point which she was unable to see her ankles.  Patient admits that she had ran out of a lot of her medications, but states that she is not quite sure which ones.  She states she is followed by Dr. Gala Romney and has a history of heart failure with leaky valve.  She has had some intermittent left-sided chest pains and wheezing in addition to her shortness of breath.  Patient reports that she fell twice due to feeling lightheaded related to her symptoms, and has been having right side pain.  Upon further questioning patient does admit to recently using cocaine a couple days ago and still intermittently smokes cigarettes from time to time.  Denies having any significant fever, nausea, vomiting, or diarrhea symptoms.  Upon admission into the emergency department patient was seen to be afebrile with blood pressures elevated up to 168/120, respirations 15-26, and all other vital signs maintained.  Patient was placed on BiPAP due to work of breathing, but was never documented to have drop in O2 saturations.  Labs significant for glucose 285, albumin 2.6, AST 74, ALT 75, D-dimer 0.56, and high-sensitivity troponin 46->43.  Chest x-ray noted enlarged heart with no acute findings.  BNP had not resulted.  Patient had been given 125 mg of Solu-Medrol IV, DuoNeb breathing  treatment, and Lasix 40 mg IV.     Review of Systems: As mentioned in the history of present illness. All other systems reviewed and are negative. Past Medical History:  Diagnosis Date   Anemia    Anginal pain (HCC)    2 months   Arrhythmia    Asthma    CHF (congestive heart failure) (HCC)    1990s   Chronic headaches    COPD (chronic obstructive pulmonary disease) (HCC)    ??   Hypercholesteremia    Hypertension    Past Surgical History:  Procedure Laterality Date   LEFT AND RIGHT HEART CATHETERIZATION WITH CORONARY ANGIOGRAM N/A 06/06/2014   Procedure: LEFT AND RIGHT HEART CATHETERIZATION WITH CORONARY ANGIOGRAM;  Surgeon: Dolores Patty, MD;  Location: [facility] CATH LAB;  Service: Cardiovascular;  Laterality: N/A;   MULTIPLE EXTRACTIONS WITH ALVEOLOPLASTY Bilateral 07/24/2015   Procedure: MULTIPLE EXTRACTIONS WITH ALVEOLOPLASTY,  BILATERAL TORI ;  Surgeon: Ocie Doyne, DDS;  Location: [facility] OR;  Service: Oral Surgery;  Laterality: Bilateral;   TUBAL LIGATION  1986   Social History:  reports that she quit smoking about 2 years ago. Her smoking use included cigarettes. She has a 35.00 pack-year smoking history. She has never used smokeless tobacco. She reports that she does not currently use drugs after having used the following drugs: "Crack" cocaine. Frequency: 4.00 times per week. She reports that she does not drink alcohol.  Allergies  Allergen Reactions   Ketoconazole Rash    Family History  Problem Relation Age of Onset   Hypertension Mother    Allergies Mother  Asthma Mother    Heart disease Mother    Stomach cancer Mother        Deceased, 41   Seizures Mother    Hypertension Father        Deceased   Hypertension Maternal Grandmother    Healthy Brother    Healthy Son    Healthy Daughter     Prior to Admission medications   Medication Sig Start Date End Date Taking? Authorizing Provider  ACCU-CHEK GUIDE test strip TEST THREE TIMES DAILY 07/21/21   Ivonne Andrew, NP  Accu-Chek Softclix Lancets lancets TEST BLOOD SUGAR THREE TIMES DAILY 07/21/21   Ivonne Andrew, NP  acetaminophen (TYLENOL) 500 MG tablet Take 500 mg by mouth every 6 (six) hours as needed.    [provider]  albuterol (VENTOLIN HFA) 108 (90 Base) MCG/ACT inhaler Inhale 2 puffs into the lungs every 6 (six) hours as needed for wheezing or shortness of breath. 03/05/21   Nyoka Cowden, MD  allopurinol (ZYLOPRIM) 100 MG tablet TAKE 1 TABLET TWICE DAILY 07/21/21   Ivonne Andrew, NP  ammonium lactate (AMLACTIN) 12 % lotion Apply 1 application topically 2 (two) times daily. 09/11/19   Freddie Breech, DPM  atorvastatin (LIPITOR) 40 MG tablet Take 1 tablet (40 mg total) by mouth daily. 02/11/21   Grayce Sessions, NP  azelastine (ASTELIN) 0.1 % nasal spray Place 2 sprays into both nostrils 2 (two) times daily. Use in each nostril as directed 01/17/22   Cobb, Ruby Cola, NP  Blood Glucose Monitoring Suppl (ACCU-CHEK AVIVA PLUS) w/Device KIT 1 each by Does not apply route 3 (three) times daily. 05/08/20   Hoy Register, MD  Budeson-Glycopyrrol-Formoterol (BREZTRI AEROSPHERE) 160-9-4.8 MCG/ACT AERO Inhale 2 puffs into the lungs in the morning and at bedtime. 01/17/22   Cobb, Ruby Cola, NP  Budeson-Glycopyrrol-Formoterol (BREZTRI AEROSPHERE) 160-9-4.8 MCG/ACT AERO Inhale 2 puffs into the lungs in the morning and at bedtime. 01/17/22   Cobb, Ruby Cola, NP  carvedilol (COREG) 3.125 MG tablet TAKE 1 TABLET BY MOUTH TWICE DAILY 06/05/19   Bensimhon, Bevelyn Buckles, MD  chlorpheniramine-HYDROcodone [facility] PENNKINETIC ER) 10-8 MG/5ML Take 5 mLs by mouth every 12 (twelve) hours as needed for cough. 01/17/22   Cobb, Ruby Cola, NP  escitalopram (LEXAPRO) 20 MG tablet Take 1 tablet (20 mg total) by mouth daily. 11/05/21   Grayce Sessions, NP  famotidine (PEPCID) 20 MG tablet One after supper 03/05/21   Nyoka Cowden, MD  fluticasone [facility]) 50 MCG/ACT nasal spray Place 2 sprays into both  nostrils daily. As needed for nasal vongestion 02/11/21   Grayce Sessions, NP  furosemide (LASIX) 40 MG tablet TAKE 1 TABLET TWICE DAILY 07/11/20   Hoy Register, MD  gabapentin (NEURONTIN) 600 MG tablet Take 600 mg by mouth. 02/27/20   [provider]  ketotifen (ALAWAY) 0.025 % ophthalmic solution Place 1 drop into both eyes 2 (two) times daily. 02/22/18   Loletta Specter, PA-C  losartan (COZAAR) 25 MG tablet TAKE 1 TABLET (25 MG TOTAL) BY MOUTH DAILY. 08/23/21 08/23/22  Ivonne Andrew, NP  metFORMIN (GLUCOPHAGE-XR) 500 MG 24 hr tablet TAKE 1 TABLETS ONCE A DAY 11/04/21   Grayce Sessions, NP  montelukast (SINGULAIR) 10 MG tablet Take 1 tablet (10 mg total) by mouth at bedtime. 01/17/22   Cobb, Ruby Cola, NP  NARCAN 4 MG/0.1ML LIQD nasal spray kit NAR REP ALN 02/26/18   [provider]  pantoprazole (PROTONIX) 40 MG  tablet Take 1 tablet (40 mg total) by mouth daily. Take 30-60 min before first meal of the day 03/05/21   Nyoka Cowden, MD  predniSONE (DELTASONE) 10 MG tablet 4 tabs for 2 days, then 3 tabs for 2 days, 2 tabs for 2 days, then 1 tab for 2 days, then stop 03/02/22   Cobb, Ruby Cola, NP  spironolactone (ALDACTONE) 25 MG tablet TAKE 1 TABLET DAILY 06/18/21   Bensimhon, Bevelyn Buckles, MD  urea (GORDONS UREA) 40 % ointment Apply to rough skin on heels once daily. 06/22/21   Freddie Breech, DPM    Physical Exam: Vitals:   04/06/22 0827 04/06/22 0830 04/06/22 0900 04/06/22 1000  BP:   (!) 151/102 (!) 145/104  Pulse:  76 89 93  Resp:  16 15 16   Temp:      TempSrc:      SpO2: 100% 100% 100% 100%  Weight:      Height:       Exam  Constitutional: Middle-aged female who appears to be in some respiratory distress Eyes: PERRL, lids and conjunctivae normal ENMT: Mucous membranes are moist. Posterior pharynx clear of any exudate or lesions.  Neck: normal, supple, JVD present. Respiratory: Mildly tachypneic with decreased aeration and rails of the lower lung  fields.  No significant wheezes appreciated at this time. Cardiovascular: Regular rate and rhythm with systolic murmur.  At least +1 pitting bilateral lower extremity edema. Abdomen: no tenderness, no masses palpated. No hepatosplenomegaly. Bowel sounds positive.  Musculoskeletal: no clubbing / cyanosis. No joint deformity upper and lower extremities. Good ROM, no contractures. Normal muscle tone.  Skin: no rashes, lesions, ulcers. No induration Neurologic: CN 2-12 grossly intact. Sensation intact, DTR normal. Strength 5/5 in all 4.  Psychiatric: Normal judgment and insight. Alert and oriented x 3. Normal mood.   Data Reviewed:  EG reveals sinus rhythm at 99 bpm with premature ventricular complexes, right axis deviation, and left atrial enlargement Assessment and Plan: Dyspnea secondary to diastolic CHF exacerbation Patient presents with complaints of progressively worsening lower extremity swelling and shortness of breath over the last 2 weeks.  She had been placed initially on BiPAP due to work of breathing.  On physical exam patient with at least +1 pitting lower extremity edema.  BNP was elevated at 1508.2.  Last echocardiogram from 2019 noted EF of 55-65% with grade 1 diastolic dysfunction.  Patient has been given Lasix 40 mg IV.  Symptoms thought to be secondary to patient not taking medications as prescribed. -Admit to a progressive bed -Heart failure order set utilized -Continuous pulse oximetry with nasal cannula oxygen maintain O2 saturation greater than 92% -Continue BiPAP and wean off when medically appropriate -Strict I&O's and daily weights -Check echocardiogram -Lasix 40 mg IV twice daily -Cardiology consulted,  will follow-up for any further recommendations  Elevated troponin Acute.  Patient did complain of intermittent left-sided chest pain.  What was high-sensitivity troponin initially 46, but repeat check 43.  Suspect secondary to demand in the setting heart failure  exacerbation. Diabetes mellitus type 2 On admission glucose elevated up to 285.  COPD, without acute exacerbation Patient reports that she has intermittently been wheezing and has been given Solu-Medrol 125 mg IV in the ED.  Chest x-ray did not note any acute normality.  Home medication regimen includes albuterol rescue inhaler and Bretzi. -Albuterol breathing treatments scheduled and as needed -Continue Breztri inhaler  Essential hypertension Blood pressures initially elevated up to 168/120.  After initial IV diuresis  blood pressures have trended down. -Hydralazine IV as needed for elevated blood pressures -Continue to monitor and add on further medications as tolerated  Diabetes mellitus type 2 controlled with hyperglycemia without long-term use of insulin On admission glucose 285.  Patient previously well controlled as last available hemoglobin A1c from 10/2021 was 6.2.  Home medication regimen appears to have previously included metformin.  This could be secondary to acute distress, but had also been given IV steroids for which suspect blood sugar not to trend down acutely. -Hypoglycemic protocols -Check hemoglobin A1c -Farxiga added on by cardiology -CBGs before every meal with moderate SSI -Adjust regimen as needed  Transaminitis Acute.  AST 74 and ALT 75.  Last hepatitis panel back in 2014.  Possibly secondary to passive hepatic congestion setting of CHF or recent use of cocaine -Check hepatitis panel and repeat CMP in a.m.  Hypoalbuminemia On admission albumin 2.6. -Check prealbumin  Polysubstance abuse Patient admits to using cocaine a couple days ago and still intermittently smokes cigarettes. -Continue to counsel on need of cessation of cocaine and tobacco use -Ativan IV as needed for any withdrawal symptoms  Advance Care Planning:   Code Status: Full Code   Consults: Cardiology  Family Communication: None requested  Severity of Illness: The appropriate patient  status for this patient is INPATIENT. Inpatient status is judged to be reasonable and necessary in order to provide the required intensity of service to ensure the patient's safety. The patient's presenting symptoms, physical exam findings, and initial radiographic and laboratory data in the context of their chronic comorbidities is felt to place them at high risk for further clinical deterioration. Furthermore, it is not anticipated that the patient will be medically stable for discharge from the hospital within 2 midnights of admission.   * I certify that at the point of admission it is my clinical judgment that the patient will require inpatient hospital care spanning beyond 2 midnights from the point of admission due to high intensity of service, high risk for further deterioration and high frequency of surveillance required.*  Author: Clydie Braun, MD 04/06/2022 11:05 AM  For on call review [email or web address].

## 2022-04-06 NOTE — ED Triage Notes (Signed)
Pt arrived POV from home c/o SHOB x1 week. Pt has a hx of asthma. Pt states her doctor told her she had too much fluid on board as well and needed to get some taken off.

## 2022-04-06 NOTE — Consult Note (Signed)
Cardiology Consultation:   Patient ID: Stacey Lin MRN: [id number]; DOB: [birth year]  Admit date: 04/06/2022 Date of Consult: 04/06/2022  PCP:  Grayce Sessions, NP   [facility] Providers Cardiologist:  None   DB/AHF     Patient Profile:   Stacey Lin is a 56 y.o. female with a hx of heart failure, hyperlipidemia, hypertension, mitral valve regurgitation, polysubstance abuse, asthma, anemia who presents with shortness of breath who is being seen 04/06/2022 for the evaluation of decompensated heart failure at the request of Dr. Shearon Balo.  History of Present Illness:   Stacey Lin is a 56 year old female previously followed by advanced heart failure clinic with combined systolic and diastolic heart failure.  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  Most recent echocardiogram in 2019 with recovered EF of 55 to 60%, mild to moderate AI, moderate TR, mild MR.  She was last seen in heart failure clinic in 2021 and was noting persistent dyspnea and dry cough in the setting of continued smoking.  Volume status felt to be stable at her last heart failure evaluation.  Was on Lasix 40 mg daily, Coreg 3.125 mg twice daily, spironolactone 25 mg daily.  Entresto stopped at that time in favor of losartan given pulmonary's opinion that it was worsening her cough.  Also felt to have rheumatic heart disease.  Has been noted to have MR and AI in the past. Repeat echocardiogram pending this admission.  Pertinent laboratory studies include pH 7.33, PCO2 43, bicarb 23, sodium 140, potassium 4.1, glucose 285, BUN 23, creatinine 0.9, calcium 1.17, magnesium 1.9, mildly elevated LFTs with an AST of 74 and ALT of 75 alk phos is normal at 89 normal bilirubin.  Significantly elevated BNP of 1500, mildly elevated and flat troponins of 46>> 43.  White blood cell count 10,000, hemoglobin 14, platelet count 281,000.  D-dimer mildly elevated 0.56.  EKG  shows sinus rhythm with biatrial enlargement and anterior infarct pattern, poor R wave progression unchanged since prior EKG in 2021.  Echocardiogram from 2019 reviewed, normal EF approximately 55 to 60%, mild features of rheumatic change of the mitral valve, mild MR and mild to moderate AI.  Aortic valve appears trileaflet but is suboptimally visualized.  No aortic valve stenosis.  Normal right heart size and function.  CT of the chest was performed as an outpatient at the end of May noting marked cardiomegaly compared to a prior study.  Chest x-ray today notes no acute findings.   Past Medical History:  Diagnosis Date   Anemia    Anginal pain (HCC)    2 months   Arrhythmia    Asthma    CHF (congestive heart failure) (HCC)    1990s   Chronic headaches    COPD (chronic obstructive pulmonary disease) (HCC)    ??   Hypercholesteremia    Hypertension     Past Surgical History:  Procedure Laterality Date   LEFT AND RIGHT HEART CATHETERIZATION WITH CORONARY ANGIOGRAM N/A 06/06/2014   Procedure: LEFT AND RIGHT HEART CATHETERIZATION WITH CORONARY ANGIOGRAM;  Surgeon: Dolores Patty, MD;  Location: [facility] CATH LAB;  Service: Cardiovascular;  Laterality: N/A;   MULTIPLE EXTRACTIONS WITH ALVEOLOPLASTY Bilateral 07/24/2015   Procedure: MULTIPLE EXTRACTIONS WITH ALVEOLOPLASTY,  BILATERAL TORI ;  Surgeon: Ocie Doyne, DDS;  Location: [facility] OR;  Service: Oral Surgery;  Laterality: Bilateral;   TUBAL LIGATION  1986     Home Medications:  Prior to Admission medications  Medication Sig Start Date End Date Taking? Authorizing Provider  acetaminophen (TYLENOL) 500 MG tablet Take 500 mg by mouth every 6 (six) hours as needed for moderate pain or headache.   Yes [provider]  Budeson-Glycopyrrol-Formoterol (BREZTRI AEROSPHERE) 160-9-4.8 MCG/ACT AERO Inhale 2 puffs into the lungs in the morning and at bedtime. 01/17/22  Yes Cobb, Ruby Cola, NP  escitalopram (LEXAPRO) 20 MG tablet Take 1  tablet (20 mg total) by mouth daily. 11/05/21  Yes Grayce Sessions, NP  metFORMIN (GLUCOPHAGE-XR) 500 MG 24 hr tablet TAKE 1 TABLETS ONCE A DAY 11/04/21  Yes Grayce Sessions, NP  montelukast (SINGULAIR) 10 MG tablet Take 1 tablet (10 mg total) by mouth at bedtime. 01/17/22  Yes Cobb, Ruby Cola, NP  SYMBICORT 80-4.5 MCG/ACT inhaler Inhale 2 puffs into the lungs daily as needed (sob/wheezing). 03/26/22  Yes [provider]  ACCU-CHEK GUIDE test strip TEST THREE TIMES DAILY 07/21/21   Ivonne Andrew, NP  Accu-Chek Softclix Lancets lancets TEST BLOOD SUGAR THREE TIMES DAILY 07/21/21   Ivonne Andrew, NP  albuterol (VENTOLIN HFA) 108 (90 Base) MCG/ACT inhaler Inhale 2 puffs into the lungs every 6 (six) hours as needed for wheezing or shortness of breath. Patient not taking: Reported on 04/06/2022 03/05/21   Nyoka Cowden, MD  allopurinol (ZYLOPRIM) 100 MG tablet TAKE 1 TABLET TWICE DAILY Patient not taking: Reported on 04/06/2022 07/21/21   Ivonne Andrew, NP  ammonium lactate (AMLACTIN) 12 % lotion Apply 1 application topically 2 (two) times daily. Patient not taking: Reported on 04/06/2022 09/11/19   Freddie Breech, DPM  atorvastatin (LIPITOR) 40 MG tablet Take 1 tablet (40 mg total) by mouth daily. Patient not taking: Reported on 04/06/2022 02/11/21   Grayce Sessions, NP  azelastine (ASTELIN) 0.1 % nasal spray Place 2 sprays into both nostrils 2 (two) times daily. Use in each nostril as directed 01/17/22   Cobb, Ruby Cola, NP  Blood Glucose Monitoring Suppl (ACCU-CHEK AVIVA PLUS) w/Device KIT 1 each by Does not apply route 3 (three) times daily. 05/08/20   Hoy Register, MD  Budeson-Glycopyrrol-Formoterol (BREZTRI AEROSPHERE) 160-9-4.8 MCG/ACT AERO Inhale 2 puffs into the lungs in the morning and at bedtime. 01/17/22   Cobb, Ruby Cola, NP  carvedilol (COREG) 3.125 MG tablet TAKE 1 TABLET BY MOUTH TWICE DAILY Patient not taking: Reported on 04/06/2022 06/05/19   Bensimhon,  Bevelyn Buckles, MD  chlorpheniramine-HYDROcodone [facility] PENNKINETIC ER) 10-8 MG/5ML Take 5 mLs by mouth every 12 (twelve) hours as needed for cough. Patient not taking: Reported on 04/06/2022 01/17/22   Noemi Chapel, NP  famotidine (PEPCID) 20 MG tablet One after supper Patient not taking: Reported on 04/06/2022 03/05/21   Nyoka Cowden, MD  fluticasone [facility]) 50 MCG/ACT nasal spray Place 2 sprays into both nostrils daily. As needed for nasal vongestion Patient not taking: Reported on 04/06/2022 02/11/21   Grayce Sessions, NP  furosemide (LASIX) 40 MG tablet TAKE 1 TABLET TWICE DAILY Patient not taking: Reported on 04/06/2022 07/11/20   Hoy Register, MD  ketotifen (ALAWAY) 0.025 % ophthalmic solution Place 1 drop into both eyes 2 (two) times daily. Patient not taking: Reported on 04/06/2022 02/22/18   Loletta Specter, PA-C  losartan (COZAAR) 25 MG tablet TAKE 1 TABLET (25 MG TOTAL) BY MOUTH DAILY. Patient not taking: Reported on 04/06/2022 08/23/21 08/23/22  Ivonne Andrew, NP  pantoprazole (PROTONIX) 40 MG tablet Take 1 tablet (40 mg total) by mouth daily. Take 30-60  min before first meal of the day Patient not taking: Reported on 04/06/2022 03/05/21   Nyoka Cowden, MD  predniSONE (DELTASONE) 10 MG tablet 4 tabs for 2 days, then 3 tabs for 2 days, 2 tabs for 2 days, then 1 tab for 2 days, then stop Patient not taking: Reported on 04/06/2022 03/02/22   Noemi Chapel, NP  spironolactone (ALDACTONE) 25 MG tablet TAKE 1 TABLET DAILY Patient not taking: Reported on 04/06/2022 06/18/21   Bensimhon, Bevelyn Buckles, MD  urea (GORDONS UREA) 40 % ointment Apply to rough skin on heels once daily. Patient not taking: Reported on 04/06/2022 06/22/21   Freddie Breech, DPM    Inpatient Medications: Scheduled Meds:  albuterol  2.5 mg Nebulization BID   Budeson-Glycopyrrol-Formoterol  2 puff Inhalation BID   enoxaparin (LOVENOX) injection  40 mg Subcutaneous Daily   furosemide  40 mg Intravenous  BID   insulin aspart  0-15 Units Subcutaneous Q4H   sodium chloride flush  3 mL Intravenous Q12H   Continuous Infusions:  PRN Meds: acetaminophen **OR** acetaminophen, albuterol  Allergies:    Allergies  Allergen Reactions   Ketoconazole Rash    Social History:   Social History   Socioeconomic History   Marital status: Legally Separated    Spouse name: Not on file   Number of children: Not on file   Years of education: Not on file   Highest education level: Not on file  Occupational History   Not on file  Tobacco Use   Smoking status: Former    Packs/day: 1.00    Years: 35.00    Total pack years: 35.00    Types: Cigarettes    Quit date: 08/26/2019    Years since quitting: 2.6   Smokeless tobacco: Never  Vaping Use   Vaping Use: Never used  Substance and Sexual Activity   Alcohol use: No    Alcohol/week: 0.0 standard drinks of alcohol   Drug use: Not Currently    Frequency: 4.0 times per week    Types: "Crack" cocaine    Comment: RELAPSE 12/2016   Sexual activity: Not on file  Other Topics Concern   Not on file  Social History Narrative   Lives with son in an apartment on the third floor.  Does not work.  On disability.  Previously worked in [employer].    Education: high school.     Social Determinants of Health   Financial Resource Strain: Not on file  Food Insecurity: Not on file  Transportation Needs: Not on file  Physical Activity: Not on file  Stress: Not on file  Social Connections: Not on file  Intimate Partner Violence: Not on file    Family History:    Family History  Problem Relation Age of Onset   Hypertension Mother    Allergies Mother    Asthma Mother    Heart disease Mother    Stomach cancer Mother        Deceased, 25   Seizures Mother    Hypertension Father        Deceased   Hypertension Maternal Grandmother    Healthy Brother    Healthy Son    Healthy Daughter      ROS:  Please see the history of present illness.   All  other ROS reviewed and negative.     Physical Exam/Data:   Vitals:   04/06/22 1137 04/06/22 1300 04/06/22 1439 04/06/22 1500  BP:  (!) 145/85  112/74  Pulse:  81    Resp:  18  (!) 22  Temp:      TempSrc:      SpO2: 97% 100% 99%   Weight:      Height:       No intake or output data in the 24 hours ending 04/06/22 1643    04/06/2022    7:31 AM 03/02/2022    4:37 PM 01/17/2022   12:09 PM  Last 3 Weights  Weight (lbs) 145 lb 161 lb 12.8 oz 166 lb 9.6 oz  Weight (kg) 65.772 kg 73.392 kg 75.569 kg     Body mass index is 28.32 kg/m.  General: Speaks in a quiet voice and appears fatigued otherwise no acute distress, speaks comfortably with supplemental oxygen cannula misplaced therefore not receiving O2 at the time of our conversation HEENT: normal Neck: JVD to the mid one third of the neck lying flat prominent V wave. Cardiac:  normal S1, S2; RRR; there is a 3/6 holosystolic murmur along the left sternal border as well as along the apex. Lungs: Crackles bilaterally Abd: soft, nontender, no hepatomegaly  Ext: 1+ edema in the ankles Musculoskeletal:  No deformities, BUE and BLE strength normal and equal Skin: warm and dry  Neuro:  CNs 2-12 intact, no focal abnormalities noted Psych:  Normal affect   EKG:  The EKG was personally reviewed and demonstrates: Sinus rhythm, anterior infarct, biatrial enlargement Telemetry:  Telemetry was personally reviewed and demonstrates: Sinus tachycardia  Relevant CV Studies: Echo pending  Laboratory Data:  High Sensitivity Troponin:   Recent Labs  Lab 04/06/22 0740 04/06/22 0945  TROPONINIHS 46* 43*     Chemistry Recent Labs  Lab 04/06/22 0740 04/06/22 0820  NA 137 139  140  K 4.2 4.1  4.1  CL 107 108  CO2 19*  --   GLUCOSE 285* 285*  BUN 21* 23*  CREATININE 1.04* 0.90  CALCIUM 8.7*  --   MG  --  1.9  GFRNONAA >60  --   ANIONGAP 11  --     Recent Labs  Lab 04/06/22 0820  PROT 4.7*  ALBUMIN 2.6*  AST 74*  ALT 75*   ALKPHOS 89  BILITOT 0.7   Lipids No results for input(s): "CHOL", "TRIG", "HDL", "LABVLDL", "LDLCALC", "CHOLHDL" in the last 168 hours.  Hematology Recent Labs  Lab 04/06/22 0740 04/06/22 0820  WBC 10.0  --   RBC 4.44  --   HGB 13.8 14.6  15.0  HCT 44.2 43.0  44.0  MCV 99.5  --   MCH 31.1  --   MCHC 31.2  --   RDW 13.7  --   PLT 281  --    Thyroid No results for input(s): "TSH", "FREET4" in the last 168 hours.  BNP Recent Labs  Lab 04/06/22 0740  BNP 1,508.2*    DDimer  Recent Labs  Lab 04/06/22 0820  DDIMER 0.56*     Radiology/Studies:  DG Chest 2 View  Result Date: 04/06/2022 CLINICAL DATA:  Left-sided chest pain and shortness of breath. Duct of cough. EXAM: CHEST - 2 VIEW COMPARISON:  01/17/2022 and CT chest 03/17/2022. FINDINGS: Trachea is midline. Heart is enlarged, as before. Thoracic aorta is calcified. Subsegmental lingular atelectasis. No airspace consolidation. Minimal thickening of the left major fissure. IMPRESSION: No acute findings. Electronically Signed   By: Leanna Battles M.D.   On: 04/06/2022 08:08     Assessment and Plan:   Principal Problem:   Acute exacerbation of  CHF (congestive heart failure) (HCC) Active Problems:   Polysubstance abuse (HCC)  Acute decompensated heart failure -Has previously had a reduced ejection fraction with recovery of EF in 2019.  Currently unknown ejection fraction and echocardiogram is pending. -Unclear if she has been taking her medications which include carvedilol, furosemide, losartan, spironolactone. -Continues to use cocaine and recently used a few days ago per EMR review.  We discussed that we will need medication compliance and avoidance of substance use for optimal management of heart failure. -Lasix 40 mg IV twice daily, reassess daily -Hold beta-blocker given sinus tachycardia, will add back as able -We will resume GDMT as tolerated, with diuresis her pressure has fallen, unclear how much she will  tolerate. -she may do well on SGLT2 inhibitor for both the indication of systolic or diastolic heart failure.  We will add today. -BNP significantly elevated but troponin only mildly elevated and flat, do not suspect ACS but will reassess if she has chest pain.  Her chest pain is reproducible on exam with auscultation of her chest, suggesting noncardiac.  History of rheumatic valvular heart disease -Echocardiogram does not demonstrate severe rheumatic heart disease, but we will reassess with current echocardiogram.  Systolic murmur noted on exam, suspect at least moderate MR and TR.   Risk Assessment/Risk Scores:        New York Heart Association (NYHA) Functional Class NYHA Class III        For questions or updates, please contact [facility] Please consult [email or web address] for contact info under    Signed, Parke Poisson, MD  04/06/2022 4:43 PM

## 2022-04-06 NOTE — ED Notes (Signed)
Patient provided with water at this time as approved by Dr. Tamala Julian at bedside

## 2022-04-06 NOTE — ED Notes (Signed)
Changed patient bed placed another brief and patient still has the external cath patient is now resting with call bell in reach

## 2022-04-06 NOTE — Progress Notes (Signed)
Heart Failure Navigator Progress Note  Assessed for Heart & Vascular TOC clinic readiness.  Patient does not meet criteria due to established with Dr. Haroldine Laws with the Advanced Heart Failure Team..     Earnestine Leys, BSN, RN Heart Failure Nurse Navigator Secure Chat Only

## 2022-04-06 NOTE — ED Notes (Signed)
Placed a external cath and placed a brief patient is resting with call bell in reach

## 2022-04-06 NOTE — ED Provider Notes (Signed)
[facility] EMERGENCY DEPARTMENT Provider Note   CSN: [id number] Arrival date & time: 04/06/22  7846     History  Chief Complaint  Patient presents with   Shortness of Breath    Stacey Lin is a 56 y.o. female.   Shortness of Breath Associated symptoms: chest pain   Patient presents for shortness of breath.  Medical history includes asthma, anemia, CHF, COPD, HLD, HTN, mitral valve regurgitation, tobacco use, cocaine use.  Her shortness of breath has been present for the past 2 weeks.  She reports that she ran out of most of her home medications 3 weeks ago.  These medications include Lasix and carvedilol.  She does not utilize breathing treatments at home.  She is followed by Dr. Haroldine Laws for her CHF.  In addition to her shortness of breath, patient has had increased leg swelling that does improve with elevation.  She has had intermittent chest pain.  She denies any other areas of discomfort.     Home Medications Prior to Admission medications   Medication Sig Start Date End Date Taking? Authorizing Provider  ACCU-CHEK GUIDE test strip TEST THREE TIMES DAILY 07/21/21   Fenton Foy, NP  Accu-Chek Softclix Lancets lancets TEST BLOOD SUGAR THREE TIMES DAILY 07/21/21   Fenton Foy, NP  acetaminophen (TYLENOL) 500 MG tablet Take 500 mg by mouth every 6 (six) hours as needed.    [provider]  albuterol (VENTOLIN HFA) 108 (90 Base) MCG/ACT inhaler Inhale 2 puffs into the lungs every 6 (six) hours as needed for wheezing or shortness of breath. 03/05/21   Tanda Rockers, MD  allopurinol (ZYLOPRIM) 100 MG tablet TAKE 1 TABLET TWICE DAILY 07/21/21   Fenton Foy, NP  ammonium lactate (AMLACTIN) 12 % lotion Apply 1 application topically 2 (two) times daily. 09/11/19   Marzetta Board, DPM  atorvastatin (LIPITOR) 40 MG tablet Take 1 tablet (40 mg total) by mouth daily. 02/11/21   [name], NP  azelastine (ASTELIN) 0.1 % nasal spray  Place 2 sprays into both nostrils 2 (two) times daily. Use in each nostril as directed 01/17/22   Cobb, Karie Schwalbe, NP  Blood Glucose Monitoring Suppl (ACCU-CHEK AVIVA PLUS) w/Device KIT 1 each by Does not apply route 3 (three) times daily. 05/08/20   Charlott Rakes, MD  Budeson-Glycopyrrol-Formoterol (BREZTRI AEROSPHERE) 160-9-4.8 MCG/ACT AERO Inhale 2 puffs into the lungs in the morning and at bedtime. 01/17/22   Cobb, Karie Schwalbe, NP  Budeson-Glycopyrrol-Formoterol (BREZTRI AEROSPHERE) 160-9-4.8 MCG/ACT AERO Inhale 2 puffs into the lungs in the morning and at bedtime. 01/17/22   Cobb, Karie Schwalbe, NP  carvedilol (COREG) 3.125 MG tablet TAKE 1 TABLET BY MOUTH TWICE DAILY 06/05/19   Bensimhon, Shaune Pascal, MD  chlorpheniramine-HYDROcodone [facility] PENNKINETIC ER) 10-8 MG/5ML Take 5 mLs by mouth every 12 (twelve) hours as needed for cough. 01/17/22   Cobb, Karie Schwalbe, NP  escitalopram (LEXAPRO) 20 MG tablet Take 1 tablet (20 mg total) by mouth daily. 11/05/21   [name], NP  famotidine (PEPCID) 20 MG tablet One after supper 03/05/21   Tanda Rockers, MD  fluticasone [facility]) 50 MCG/ACT nasal spray Place 2 sprays into both nostrils daily. As needed for nasal vongestion 02/11/21   [name], NP  furosemide (LASIX) 40 MG tablet TAKE 1 TABLET TWICE DAILY 07/11/20   Charlott Rakes, MD  gabapentin (NEURONTIN) 600 MG tablet Take 600 mg by mouth. 02/27/20   [provider]  ketotifen (  ALAWAY) 0.025 % ophthalmic solution Place 1 drop into both eyes 2 (two) times daily. 02/22/18   Clent Demark, PA-C  losartan (COZAAR) 25 MG tablet TAKE 1 TABLET (25 MG TOTAL) BY MOUTH DAILY. 08/23/21 08/23/22  Fenton Foy, NP  metFORMIN (GLUCOPHAGE-XR) 500 MG 24 hr tablet TAKE 1 TABLETS ONCE A DAY 11/04/21   [name], NP  montelukast (SINGULAIR) 10 MG tablet Take 1 tablet (10 mg total) by mouth at bedtime. 01/17/22   Cobb, Karie Schwalbe, NP  NARCAN 4 MG/0.1ML LIQD nasal spray kit NAR REP  ALN 02/26/18   [provider]  pantoprazole (PROTONIX) 40 MG tablet Take 1 tablet (40 mg total) by mouth daily. Take 30-60 min before first meal of the day 03/05/21   Tanda Rockers, MD  predniSONE (DELTASONE) 10 MG tablet 4 tabs for 2 days, then 3 tabs for 2 days, 2 tabs for 2 days, then 1 tab for 2 days, then stop 03/02/22   Cobb, Karie Schwalbe, NP  spironolactone (ALDACTONE) 25 MG tablet TAKE 1 TABLET DAILY 06/18/21   Bensimhon, Shaune Pascal, MD  urea (GORDONS UREA) 40 % ointment Apply to rough skin on heels once daily. 06/22/21   Marzetta Board, DPM      Allergies    Ketoconazole    Review of Systems   Review of Systems  Constitutional:  Positive for fatigue.  Respiratory:  Positive for shortness of breath.   Cardiovascular:  Positive for chest pain and leg swelling.  All other systems reviewed and are negative.   Physical Exam Updated Vital Signs BP (!) 145/104   Pulse 93   Temp 97.9 F (36.6 C) (Oral)   Resp 16   Ht 5' (1.524 m)   Wt 65.8 kg   LMP  (LMP Unknown)   SpO2 100%   BMI 28.32 kg/m  Physical Exam Vitals and nursing note reviewed.  Constitutional:      General: She is in acute distress.     Appearance: She is well-developed and normal weight. She is ill-appearing. She is not toxic-appearing or diaphoretic.  HENT:     Head: Normocephalic and atraumatic.     Mouth/Throat:     Mouth: Mucous membranes are moist.     Pharynx: Oropharynx is clear.  Eyes:     Extraocular Movements: Extraocular movements intact.     Conjunctiva/sclera: Conjunctivae normal.  Cardiovascular:     Rate and Rhythm: Normal rate and regular rhythm.     Heart sounds: No murmur heard. Pulmonary:     Effort: Tachypnea, accessory muscle usage and respiratory distress present.     Breath sounds: Decreased breath sounds, wheezing and rales present. No rhonchi.  Chest:     Chest wall: No mass, tenderness or edema.  Abdominal:     Palpations: Abdomen is soft.     Tenderness: There is  no abdominal tenderness.  Musculoskeletal:        General: No swelling.     Cervical back: Normal range of motion and neck supple.     Right lower leg: Edema present.     Left lower leg: Edema present.  Skin:    General: Skin is warm and dry.     Coloration: Skin is not cyanotic or pale.  Neurological:     General: No focal deficit present.     Mental Status: She is alert and oriented to person, place, and time.     Cranial Nerves: No cranial nerve deficit.  Motor: No weakness.  Psychiatric:        Mood and Affect: Mood normal.        Behavior: Behavior normal.     ED Results / Procedures / Treatments   Labs (all labs ordered are listed, but only abnormal results are displayed) Labs Reviewed  BASIC METABOLIC PANEL - Abnormal; Notable for the following components:      Result Value   CO2 19 (*)    Glucose, Bld 285 (*)    BUN 21 (*)    Creatinine, Ser 1.04 (*)    Calcium 8.7 (*)    All other components within normal limits  HEPATIC FUNCTION PANEL - Abnormal; Notable for the following components:   Total Protein 4.7 (*)    Albumin 2.6 (*)    AST 74 (*)    ALT 75 (*)    All other components within normal limits  D-DIMER, QUANTITATIVE - Abnormal; Notable for the following components:   D-Dimer, Quant 0.56 (*)    All other components within normal limits  I-STAT CHEM 8, ED - Abnormal; Notable for the following components:   BUN 23 (*)    Glucose, Bld 285 (*)    Calcium, Ion 1.14 (*)    All other components within normal limits  I-STAT VENOUS BLOOD GAS, ED - Abnormal; Notable for the following components:   pCO2, Ven 42.7 (*)    pO2, Ven 205 (*)    Acid-base deficit 3.0 (*)    All other components within normal limits  TROPONIN I (HIGH SENSITIVITY) - Abnormal; Notable for the following components:   Troponin I (High Sensitivity) 46 (*)    All other components within normal limits  RESP PANEL BY RT-PCR (FLU A&B, COVID) ARPGX2  CBC  MAGNESIUM  BRAIN NATRIURETIC  PEPTIDE  BLOOD GAS, VENOUS  I-STAT BETA HCG BLOOD, ED ([facility], [facility], [facility] ONLY)  TROPONIN I (HIGH SENSITIVITY)    EKG EKG Interpretation  Date/Time:  Wednesday April 06 2022 07:25:52 EDT Ventricular Rate:  99 PR Interval:  146 QRS Duration: 80 QT Interval:  358 QTC Calculation: 459 R Axis:   133 Text Interpretation: Sinus rhythm with Premature supraventricular complexes Left atrial enlargement Right axis deviation Low voltage QRS Abnormal ECG Confirmed by Godfrey Pick ([id number]) on 04/06/2022 8:11:41 AM  Radiology DG Chest 2 View  Result Date: 04/06/2022 CLINICAL DATA:  Left-sided chest pain and shortness of breath. Duct of cough. EXAM: CHEST - 2 VIEW COMPARISON:  01/17/2022 and CT chest 03/17/2022. FINDINGS: Trachea is midline. Heart is enlarged, as before. Thoracic aorta is calcified. Subsegmental lingular atelectasis. No airspace consolidation. Minimal thickening of the left major fissure. IMPRESSION: No acute findings. Electronically Signed   By: Lorin Picket M.D.   On: 04/06/2022 08:08    Procedures Procedures    Medications Ordered in ED Medications  methylPREDNISolone sodium succinate (SOLU-MEDROL) 125 mg/2 mL injection 125 mg (125 mg Intravenous Given 04/06/22 0857)  ipratropium-albuterol (DUONEB) 0.5-2.5 (3) MG/3ML nebulizer solution 3 mL (3 mLs Nebulization Given 04/06/22 0827)  furosemide (LASIX) injection 40 mg (40 mg Intravenous Given 04/06/22 4665)    ED Course/ Medical Decision Making/ A&P                           Medical Decision Making Amount and/or Complexity of Data Reviewed Labs: ordered. Radiology: ordered.  Risk Prescription drug management. Decision regarding hospitalization.   This patient presents to the ED for concern of shortness of  breath, this involves an extensive number of treatment options, and is a complaint that carries with it a high risk of complications and morbidity.  The differential diagnosis includes CHF exacerbation, pulmonary edema, COPD  exacerbation, PE, ACS, pneumonia   Co morbidities that complicate the patient evaluation  asthma, anemia, CHF, COPD, HLD, HTN, mitral valve regurgitation, tobacco use, cocaine use   Additional history obtained:  Additional history obtained from N/A External records from outside source obtained and reviewed including EMR   Lab Tests:  I Ordered, and personally interpreted labs.  The pertinent results include: Normal electrolytes, normal hemoglobin, no leukocytosis, D-dimer (when age-adjusted) is normal.  Patient does have mild elevation in transaminases and troponin.   Imaging Studies ordered:  I ordered imaging studies including chest x-ray I independently visualized and interpreted imaging which showed baseline cardiomegaly, no focal opacities I agree with the radiologist interpretation   Cardiac Monitoring: / EKG:  The patient was maintained on a cardiac monitor.  I personally viewed and interpreted the cardiac monitored which showed an underlying rhythm of: Sinus rhythm   Problem List / ED Course / Critical interventions / Medication management  Patient is a 56 year old female presenting for 2 weeks of worsening shortness of breath.  This follows her reportedly running out of her home medications 3 weeks ago.  Her home medications include carvedilol and Lasix.  She has had worsening shortness of breath, orthopnea, lower extremity edema.  On arrival in the ED, patient is ill-appearing with increased work of breathing.  She has diminished lung sounds and mild wheezing on lung auscultation.  I suspect that her shortness of breath is multifactorial and primarily driven by volume overload secondary to nonadherence to her home diuretics.  Given evidence of wheeze, patient was ordered Solu-Medrol and DuoNeb.  Given her increased work of breathing, patient to be initiated on BiPAP.  Laboratory work-up was initiated.  On lab work, patient is found to have normal electrolytes.  Dose of IV  Lasix was given.  Per chart review, patient last had echocardiogram in 2019.  At that time, LVEF was 55 to 65% with grade 1 diastolic dysfunction.  She was noted to have mild mitral regurgitation and moderate tricuspid regurgitation.  She has been seen by Dr. Haroldine Laws in the past but it appears that she was lost to follow-up for the past 3 years.  On bedside echocardiogram today, patient has significantly diminished LVEF.  She has biatrial enlargement.  She is also found to have biapical B-lines consistent with cardiogenic pulmonary edema.  Patient tolerated BiPAP well.  When taken off of BiPAP, patient had continued increased work of breathing and BiPAP was resumed.  I suspect that the patient's shortness of breath is multifactorial but is primarily driven by pulmonary edema secondary to worsened cardiac function.  She will benefit from continued BiPAP, further diuresis and formal echocardiogram.  Patient was admitted to hospitalist for further management. I ordered medication including Solu-Medrol and DuoNeb for wheezing; Lasix for diuresis Reevaluation of the patient after these medicines showed that the patient improved I have reviewed the patients home medicines and have made adjustments as needed   Social Determinants of Health:  Has access to outpatient care  CRITICAL CARE Performed by: Godfrey Pick   Total critical care time: 42 minutes  Critical care time was exclusive of separately billable procedures and treating other patients.  Critical care was necessary to treat or prevent imminent or life-threatening deterioration.  Critical care was time spent personally by me  on the following activities: development of treatment plan with patient and/or surrogate as well as nursing, discussions with consultants, evaluation of patient's response to treatment, examination of patient, obtaining history from patient or surrogate, ordering and performing treatments and interventions, ordering and  review of laboratory studies, ordering and review of radiographic studies, pulse oximetry and re-evaluation of patient's condition.         Final Clinical Impression(s) / ED Diagnoses Final diagnoses:  Acute on chronic combined systolic and diastolic congestive heart failure ([place])  Respiratory distress    Rx / DC Orders ED Discharge Orders     None         Godfrey Pick, MD 04/06/22 1107

## 2022-04-07 ENCOUNTER — Inpatient Hospital Stay (HOSPITAL_COMMUNITY): Payer: Medicare Other

## 2022-04-07 DIAGNOSIS — I5043 Acute on chronic combined systolic (congestive) and diastolic (congestive) heart failure: Secondary | ICD-10-CM

## 2022-04-07 DIAGNOSIS — I5033 Acute on chronic diastolic (congestive) heart failure: Secondary | ICD-10-CM | POA: Diagnosis not present

## 2022-04-07 DIAGNOSIS — R519 Headache, unspecified: Secondary | ICD-10-CM

## 2022-04-07 DIAGNOSIS — I5021 Acute systolic (congestive) heart failure: Secondary | ICD-10-CM

## 2022-04-07 DIAGNOSIS — E669 Obesity, unspecified: Secondary | ICD-10-CM

## 2022-04-07 LAB — CBC
HCT: 41.2 % (ref 36.0–46.0)
Hemoglobin: 13.4 g/dL (ref 12.0–15.0)
MCH: 31.1 pg (ref 26.0–34.0)
MCHC: 32.5 g/dL (ref 30.0–36.0)
MCV: 95.6 fL (ref 80.0–100.0)
Platelets: 260 10*3/uL (ref 150–400)
RBC: 4.31 MIL/uL (ref 3.87–5.11)
RDW: 13.4 % (ref 11.5–15.5)
WBC: 11.6 10*3/uL — ABNORMAL HIGH (ref 4.0–10.5)
nRBC: 0.3 % — ABNORMAL HIGH (ref 0.0–0.2)

## 2022-04-07 LAB — GLUCOSE, CAPILLARY
Glucose-Capillary: 132 mg/dL — ABNORMAL HIGH (ref 70–99)
Glucose-Capillary: 144 mg/dL — ABNORMAL HIGH (ref 70–99)
Glucose-Capillary: 168 mg/dL — ABNORMAL HIGH (ref 70–99)
Glucose-Capillary: 174 mg/dL — ABNORMAL HIGH (ref 70–99)
Glucose-Capillary: 180 mg/dL — ABNORMAL HIGH (ref 70–99)
Glucose-Capillary: 203 mg/dL — ABNORMAL HIGH (ref 70–99)

## 2022-04-07 LAB — ECHOCARDIOGRAM COMPLETE
AR max vel: 1.71 cm2
AV Peak grad: 8.6 mmHg
Ao pk vel: 1.47 m/s
Area-P 1/2: 4.31 cm2
Calc EF: 33.8 %
Height: 60 in
MV M vel: 5.59 m/s
MV Peak grad: 125 mmHg
MV VTI: 0.88 cm2
P 1/2 time: 457 msec
S' Lateral: 5.4 cm
Single Plane A2C EF: 36.4 %
Single Plane A4C EF: 32.7 %
Weight: 2572.8 oz

## 2022-04-07 LAB — COMPREHENSIVE METABOLIC PANEL
ALT: 61 U/L — ABNORMAL HIGH (ref 0–44)
AST: 32 U/L (ref 15–41)
Albumin: 2.4 g/dL — ABNORMAL LOW (ref 3.5–5.0)
Alkaline Phosphatase: 85 U/L (ref 38–126)
Anion gap: 9 (ref 5–15)
BUN: 26 mg/dL — ABNORMAL HIGH (ref 6–20)
CO2: 29 mmol/L (ref 22–32)
Calcium: 8.8 mg/dL — ABNORMAL LOW (ref 8.9–10.3)
Chloride: 104 mmol/L (ref 98–111)
Creatinine, Ser: 1.21 mg/dL — ABNORMAL HIGH (ref 0.44–1.00)
GFR, Estimated: 52 mL/min — ABNORMAL LOW (ref >60)
Glucose, Bld: 125 mg/dL — ABNORMAL HIGH (ref 70–99)
Potassium: 3.7 mmol/L (ref 3.5–5.1)
Sodium: 142 mmol/L (ref 135–145)
Total Bilirubin: 0.7 mg/dL (ref 0.3–1.2)
Total Protein: 4.5 g/dL — ABNORMAL LOW (ref 6.5–8.1)

## 2022-04-07 LAB — HEPATITIS PANEL, ACUTE
HCV Ab: NONREACTIVE
Hep A IgM: NONREACTIVE
Hep B C IgM: NONREACTIVE
Hepatitis B Surface Ag: NONREACTIVE

## 2022-04-07 LAB — RAPID URINE DRUG SCREEN, HOSP PERFORMED
Amphetamines: NOT DETECTED
Barbiturates: NOT DETECTED
Benzodiazepines: NOT DETECTED
Cocaine: POSITIVE — AB
Opiates: NOT DETECTED
Tetrahydrocannabinol: NOT DETECTED

## 2022-04-07 MED ORDER — CARVEDILOL 3.125 MG PO TABS
3.1250 mg | ORAL_TABLET | Freq: Two times a day (BID) | ORAL | Status: DC
  Administered 2022-04-07 – 2022-04-13 (×12): 3.125 mg via ORAL
  Filled 2022-04-07 (×12): qty 1

## 2022-04-07 NOTE — Assessment & Plan Note (Signed)
utox positive for cocaine, encourage cessation

## 2022-04-07 NOTE — Assessment & Plan Note (Signed)
Related to HF? Cocaine use? Negative acute hepatitis panel trend

## 2022-04-07 NOTE — Assessment & Plan Note (Signed)
Follow RD

## 2022-04-07 NOTE — Assessment & Plan Note (Signed)
noted 

## 2022-04-07 NOTE — Assessment & Plan Note (Addendum)
Continue home meds Mild wheezing as above S/p solumedrol in ED, I don't think she needs steroids, but will continue nebs for now

## 2022-04-07 NOTE — Progress Notes (Addendum)
Progress Note  Patient Name: Stacey Lin Date of Encounter: 04/07/2022  [facility] Cardiologist: None   Subjective   Patient continues to have trouble breathing, is on 4 L supplemental oxygen via nasal cannula. Denies chest pain. Has a headache.   Inpatient Medications    Scheduled Meds:  albuterol  2.5 mg Nebulization BID   dapagliflozin propanediol  10 mg Oral Daily   enoxaparin (LOVENOX) injection  40 mg Subcutaneous Daily   fluticasone furoate-vilanterol  1 puff Inhalation Daily   And   umeclidinium bromide  1 puff Inhalation Daily   furosemide  40 mg Intravenous BID   insulin aspart  0-15 Units Subcutaneous Q4H   sodium chloride flush  3 mL Intravenous Q12H   Continuous Infusions:  PRN Meds: acetaminophen **OR** acetaminophen, albuterol, hydrALAZINE, LORazepam   Vital Signs    Vitals:   04/07/22 0007 04/07/22 0413 04/07/22 0802 04/07/22 0848  BP: 112/84 124/84    Pulse: 93 87    Resp:  19 20   Temp: 97.8 F (36.6 C) 97.8 F (36.6 C) 97.6 F (36.4 C)   TempSrc: Oral Oral Oral   SpO2: 99% 95%  97%  Weight:  72.9 kg    Height:        Intake/Output Summary (Last 24 hours) at 04/07/2022 0919 Last data filed at 04/07/2022 0414 Gross per 24 hour  Intake 1080 ml  Output 2325 ml  Net -1245 ml      04/07/2022    4:13 AM 04/06/2022    5:33 PM 04/06/2022    7:31 AM  Last 3 Weights  Weight (lbs) 160 lb 12.8 oz 161 lb 6 oz 145 lb  Weight (kg) 72.938 kg 73.2 kg 65.772 kg      Telemetry    Sinus rhythm, HR in the 90s-100s. Brief episodes of tachycardia with HR to the 120s - Personally Reviewed  ECG    No new tracings since 6/14 - Personally Reviewed  Physical Exam   GEN: No acute distress.  Laying comfortably in the bed. Sleeping when I entered room, somewhat difficult to arouse but she answered questions appropriately  Neck: No JVD Cardiac: RRR, grade 1/6 systolic murmur at left sternal border  Respiratory: Diminished breath sounds in bilateral  lung bases with crackles  GI: Soft, nontender, non-distended  MS: Trace edema; No deformity. Neuro:  Nonfocal  Psych: Normal affect   Labs    High Sensitivity Troponin:   Recent Labs  Lab 04/06/22 0740 04/06/22 0945  TROPONINIHS 46* 43*     Chemistry Recent Labs  Lab 04/06/22 0740 04/06/22 0820 04/07/22 0420  NA 137 139  140 142  K 4.2 4.1  4.1 3.7  CL 107 108 104  CO2 19*  --  29  GLUCOSE 285* 285* 125*  BUN 21* 23* 26*  CREATININE 1.04* 0.90 1.21*  CALCIUM 8.7*  --  8.8*  MG  --  1.9  --   PROT  --  4.7* 4.5*  ALBUMIN  --  2.6* 2.4*  AST  --  74* 32  ALT  --  75* 61*  ALKPHOS  --  89 85  BILITOT  --  0.7 0.7  GFRNONAA >60  --  52*  ANIONGAP 11  --  9    Lipids No results for input(s): "CHOL", "TRIG", "HDL", "LABVLDL", "LDLCALC", "CHOLHDL" in the last 168 hours.  Hematology Recent Labs  Lab 04/06/22 0740 04/06/22 0820 04/07/22 0420  WBC 10.0  --  11.6*  RBC 4.44  --  4.31  HGB 13.8 14.6  15.0 13.4  HCT 44.2 43.0  44.0 41.2  MCV 99.5  --  95.6  MCH 31.1  --  31.1  MCHC 31.2  --  32.5  RDW 13.7  --  13.4  PLT 281  --  260   Thyroid  Recent Labs  Lab 04/06/22 1745  TSH 0.459    BNP Recent Labs  Lab 04/06/22 0740  BNP 1,508.2*    DDimer  Recent Labs  Lab 04/06/22 0820  DDIMER 0.56*     Radiology    DG Chest 2 View  Result Date: 04/06/2022 CLINICAL DATA:  Left-sided chest pain and shortness of breath. Duct of cough. EXAM: CHEST - 2 VIEW COMPARISON:  01/17/2022 and CT chest 03/17/2022. FINDINGS: Trachea is midline. Heart is enlarged, as before. Thoracic aorta is calcified. Subsegmental lingular atelectasis. No airspace consolidation. Minimal thickening of the left major fissure. IMPRESSION: No acute findings. Electronically Signed   By: Lorin Picket M.D.   On: 04/06/2022 08:08    Cardiac Studies   Echocardiogram 04/07/22 Pending   Patient Profile     56 y.o. female with a history of heart failure, HLD, HTN, mitral valve  regurgitation, polysubstance abuse, asthma, anemia who presented with SOB, was seen 6/14 for evaluation of decompensated heart failure.   Assessment & Plan    Acute decompensated heart failure  - Patient previously had a reduced EF of 20-25% in 03/2016. Echo in 03/2017 showed that EF had improved to 60-65% with grade I diastolic dysfunction  - Echo this admission pending  - Home regiment included carvedilol, furosemide, losartan, spironolactone. However, patient was not taking her medications  - Continues to use cocaine-- UDS positive for cocaine, reported that she last used a few days ago. Cocaine is definitely hurting her heart and contributing to symptoms  - Patient has been educated on the importance of medication compliance, stopping substance abuse.  - Currently on IV lasix 40 mg BID-- output 2.3 L urine yesterday and is currently net -1.2 L since admission  - Creatinine bumped from 0.90>>1.21 after 2 doses IV lasix, and addition of SGLT2i yesterday. Continues to be SOB with oxygen requirement, on 4L supplemental oxygen via [place] - Suspect that creatinine bumped due to combination of lasix and new SGLT2i. Hold additional doses of lasix today. May need RHC  as renal function worsened but continues to have sob, crackles in lungs. Will discuss with MD  - BP did fall with diuresis, add back GDMT as tolerated  - Continue farxiga 10 mg daily  - Continue to hold losartan and spiro as renal function slightly worse this AM  - Restart home carvedilol 3.125 mg BID due to tachycardia   History of Rheumatic Valvular Heart Disease  - Most recent echocardiogram in 03/2018 showed mild-moderate aortic valve regurgitation, mild mitral valve regurgitation, moderate tricuspid valve regurgitation  - Echo this admission pending   Polysubstance Abuse - Continues to use cocaine and smoke cigarettes  - Patient has been counseled on the importance of stopping these substances, and their negative effects on heart  health   Elevated trop  - hsTn 46>>43 - Minimal elevation with flat trend-- not consistent with Acs, suspect secondary to demand in the setting of heart failure exacerbation   Otherwise Per primary  - Type 2 DM  - Transaminitis  - Hypoalbuminemia  - COPD      For questions or updates, please contact [facility] Please consult  [email or web address] for contact info under        Signed, Margie Billet, PA-C  04/07/2022, 9:19 AM    Patient seen and examined with KJ PA.  Agree as above, with the following exceptions and changes as noted below. Pt does not readily participate in history or review of symptoms, challenging to assess if she is improving. Will attempt to use objective measures. Gen: NAD, CV: RRR, no murmurs, Lungs: clear, Abd: soft, Extrem: Warm, well perfused, no edema, Neuro/Psych: alert and oriented x 3, normal mood and affect. All available labs, radiology testing, previous records reviewed. Still requiring oxygen. Symptoms may also be driven by drug use. Echocardiogram noted to have severely depressed EF of 20-25% by my review on this admission, similar to historical drop in EF. Will attempt diuresis again for 1-2 days and if not well tolerated from renal function, will consider RHC for hemodynamics and consider milrinone.   Elouise Munroe, MD 04/07/22 10:23 PM

## 2022-04-07 NOTE — Assessment & Plan Note (Signed)
resolved 

## 2022-04-07 NOTE — Assessment & Plan Note (Signed)
Coreg Follow for adjustments

## 2022-04-07 NOTE — Hospital Course (Signed)
Stacey Lin is Stacey Lin 56 y.o. female with medical history significant of hypertension, hyperlipidemia, congestive heart failure, diabetes mellitus type 2, COPD, and prior tobacco abuse who presents with complaints of shortness of breath progressively worsening over the last 2 weeks.  She complains of having progressively worsening swelling of her feet to the point which she was unable to see her ankles.  Patient admits that she had ran out of Felix Meras lot of her medications, but states that she is not quite sure which ones.  She states she is followed by Dr. Haroldine Laws and has [place] history of heart failure with leaky valve.  She has had some intermittent left-sided chest pains and wheezing in addition to her shortness of breath.  Patient reports that she fell twice due to feeling lightheaded related to her symptoms, and has been having right side pain.  Upon further questioning patient does admit to recently using cocaine Stacey Lin couple days ago and still intermittently smokes cigarettes from time to time.  Denies having any significant fever, nausea, vomiting, or diarrhea symptoms.   Upon admission into the emergency department patient was seen to be afebrile with blood pressures elevated up to 168/120, respirations 15-26, and all other vital signs maintained.  Patient was placed on BiPAP due to work of breathing, but was never documented to have drop in O2 saturations.  Labs significant for glucose 285, albumin 2.6, AST 74, ALT 75, D-dimer 0.56, and high-sensitivity troponin 46->43.  Chest x-ray noted enlarged heart with no acute findings.  BNP had not resulted.  Patient had been given 125 mg of Solu-Medrol IV, DuoNeb breathing treatment, and Lasix 40 mg IV.

## 2022-04-07 NOTE — Progress Notes (Signed)
PROGRESS NOTE    Stacey Lin  ZOX:[id number] DOB: 1966-07-16 DOA: 04/06/2022 PCP: Grayce Sessions, NP  Chief Complaint  Patient presents with   Shortness of Breath    Brief Narrative:  Stacey Lin is Stacey Lin 56 y.o. female with medical history significant of hypertension, hyperlipidemia, congestive heart failure, diabetes mellitus type 2, COPD, and prior tobacco abuse who presents with complaints of shortness of breath progressively worsening over the last 2 weeks.  She complains of having progressively worsening swelling of her feet to the point which she was unable to see her ankles.  Patient admits that she had ran out of Hersel Mcmeen lot of her medications, but states that she is not quite sure which ones.  She states she is followed by Dr. Gala Romney and has Breannah Kratt history of heart failure with leaky valve.  She has had some intermittent left-sided chest pains and wheezing in addition to her shortness of breath.  Patient reports that she fell twice due to feeling lightheaded related to her symptoms, and has been having right side pain.  Upon further questioning patient does admit to recently using cocaine Lothar Prehn couple days ago and still intermittently smokes cigarettes from time to time.  Denies having any significant fever, nausea, vomiting, or diarrhea symptoms.   Upon admission into the emergency department patient was seen to be afebrile with blood pressures elevated up to 168/120, respirations 15-26, and all other vital signs maintained.  Patient was placed on BiPAP due to work of breathing, but was never documented to have drop in O2 saturations.  Labs significant for glucose 285, albumin 2.6, AST 74, ALT 75, D-dimer 0.56, and high-sensitivity troponin 46->43.  Chest x-ray noted enlarged heart with no acute findings.  BNP had not resulted.  Patient had been given 125 mg of Solu-Medrol IV, DuoNeb breathing treatment, and Lasix 40 mg IV.      Assessment & Plan:   Principal Problem:   Acute exacerbation  of CHF (congestive heart failure) (HCC) Active Problems:   Acute respiratory failure with hypoxia (HCC)   Elevated troponin   COPD without exacerbation (HCC)   Essential hypertension, benign   Controlled type 2 diabetes mellitus with hyperglycemia (HCC)   Transaminitis   Hypoalbuminemia   Polysubstance abuse (HCC)   Headache   Obesity (BMI 30-39.9)   Assessment and Plan: * Acute exacerbation of CHF (congestive heart failure) (HCC) Patient presents with complaints of progressively worsening lower extremity swelling and shortness of breath over the last 2 weeks.   CXR 6/15 without acute findings Initially required bipap Improved edema today BNP was elevated at 1508.2 echocardiogram from 2019 noted EF of 55-65% with grade 1 diastolic dysfunction Repeat echo pending Diuresis per cardiology Appreciate cardiology recs - planning to resume GDMT as tolerated  Acute respiratory failure with hypoxia (HCC) Requiring 4 L today, due to presumed HF exacerbation CXR without acute findings, though exam at presentation concerning for HF Low threshold for additional w/u  Elevated troponin Seems msk vs pleuritic/related to cough Elevated, but flat Appreciate cards recs, low suspicion for acs   COPD without exacerbation (HCC) Continue home meds Not currently wheezing S/p solumedrol in ED, hold further  Essential hypertension, benign Coreg Follow for adjustments  Controlled type 2 diabetes mellitus with hyperglycemia (HCC) a1c 10/2021 6.2 Repeat SSI Started on farxiga  Transaminitis Related to HF? Cocaine use? Negative acute hepatitis panel trend  Hypoalbuminemia Follow RD  Polysubstance abuse (HCC) utox positive for cocaine, encourage cessation  Headache Will continue to monitor  Treat symptomatically  Obesity (BMI 30-39.9) noted       DVT prophylaxis: lovenox Code Status: full Family Communication: none at bedside Disposition:   Status is:  Inpatient Remains inpatient appropriate because: continued resp failure   Consultants:  cardiology  Procedures:  echo  Antimicrobials:  Anti-infectives (From admission, onward)    None       Subjective: C/o headache  Objective: Vitals:   04/07/22 0007 04/07/22 0413 04/07/22 0802 04/07/22 0848  BP: 112/84 124/84    Pulse: 93 87    Resp:  19 20   Temp: 97.8 F (36.6 C) 97.8 F (36.6 C) 97.6 F (36.4 C)   TempSrc: Oral Oral Oral   SpO2: 99% 95%  97%  Weight:  72.9 kg    Height:        Intake/Output Summary (Last 24 hours) at 04/07/2022 1620 Last data filed at 04/07/2022 1406 Gross per 24 hour  Intake 1560 ml  Output 2575 ml  Net -1015 ml   Filed Weights   04/06/22 0731 04/06/22 1733 04/07/22 0413  Weight: 65.8 kg 73.2 kg 72.9 kg    Examination:  General exam: Appears calm and comfortable  Respiratory system: coarse breath sounds, frequent cough Cardiovascular system: RRR Gastrointestinal system: Abdomen is nondistended, soft and nontender Central nervous system: Alert and oriented. No focal neurological deficits. Extremities: improved edema   Data Reviewed: I have personally reviewed following labs and imaging studies  CBC: Recent Labs  Lab 04/06/22 0740 04/06/22 0820 04/07/22 0420  WBC 10.0  --  11.6*  HGB 13.8 14.6  15.0 13.4  HCT 44.2 43.0  44.0 41.2  MCV 99.5  --  95.6  PLT 281  --  260    Basic Metabolic Panel: Recent Labs  Lab 04/06/22 0740 04/06/22 0820 04/07/22 0420  NA 137 139  140 142  K 4.2 4.1  4.1 3.7  CL 107 108 104  CO2 19*  --  29  GLUCOSE 285* 285* 125*  BUN 21* 23* 26*  CREATININE 1.04* 0.90 1.21*  CALCIUM 8.7*  --  8.8*  MG  --  1.9  --     GFR: Estimated Creatinine Clearance: 45.8 mL/min (Jaonna Word) (by C-G formula based on SCr of 1.21 mg/dL (H)).  Liver Function Tests: Recent Labs  Lab 04/06/22 0820 04/07/22 0420  AST 74* 32  ALT 75* 61*  ALKPHOS 89 85  BILITOT 0.7 0.7  PROT 4.7* 4.5*  ALBUMIN 2.6* 2.4*     CBG: Recent Labs  Lab 04/07/22 0004 04/07/22 0409 04/07/22 1002 04/07/22 1259 04/07/22 1615  GLUCAP 203* 144* 180* 174* 132*     Recent Results (from the past 240 hour(s))  Resp Panel by RT-PCR (Flu Heron Pitcock&B, Covid) Anterior Nasal Swab     Status: None   Collection Time: 04/06/22  5:17 PM   Specimen: Anterior Nasal Swab  Result Value Ref Range Status   SARS Coronavirus 2 by RT PCR NEGATIVE NEGATIVE Final    Comment: (NOTE) SARS-CoV-2 target nucleic acids are NOT DETECTED.  The SARS-CoV-2 RNA is generally detectable in upper respiratory specimens during the acute phase of infection. The lowest concentration of SARS-CoV-2 viral copies this assay can detect is 138 copies/mL. Yelina Sarratt negative result does not preclude SARS-Cov-2 infection and should not be used as the sole basis for treatment or other patient management decisions. Kourtney Montesinos negative result may occur with  improper specimen collection/handling, submission of specimen other than nasopharyngeal swab, presence of viral mutation(s) within the areas targeted  by this assay, and inadequate number of viral copies(<138 copies/mL). Kanani Mowbray negative result must be combined with clinical observations, patient history, and epidemiological information. The expected result is Negative.  Fact Sheet for Patients:  BloggerCourse.com  Fact Sheet for Healthcare Providers:  SeriousBroker.it  This test is no t yet approved or cleared by the [place] FDA and  has been authorized for detection and/or diagnosis of SARS-CoV-2 by FDA under an Emergency Use Authorization (EUA). This EUA will remain  in effect (meaning this test can be used) for the duration of the COVID-19 declaration under Section 564(b)(1) of the Act, 21 U.S.C.section 360bbb-3(b)(1), unless the authorization is terminated  or revoked sooner.       Influenza Kaileena Obi by PCR NEGATIVE NEGATIVE Final   Influenza B by PCR NEGATIVE NEGATIVE  Final    Comment: (NOTE) The Xpert Xpress SARS-CoV-2/FLU/RSV plus assay is intended as an aid in the diagnosis of influenza from Nasopharyngeal swab specimens and should not be used as Jacki Couse sole basis for treatment. Nasal washings and aspirates are unacceptable for Xpert Xpress SARS-CoV-2/FLU/RSV testing.  Fact Sheet for Patients: BloggerCourse.com  Fact Sheet for Healthcare Providers: SeriousBroker.it  This test is not yet approved or cleared by the [place] FDA and has been authorized for detection and/or diagnosis of SARS-CoV-2 by FDA under an Emergency Use Authorization (EUA). This EUA will remain in effect (meaning this test can be used) for the duration of the COVID-19 declaration under Section 564(b)(1) of the Act, 21 U.S.C. section 360bbb-3(b)(1), unless the authorization is terminated or revoked.  Performed at [facility], [address], [place], [place] [address]          Radiology Studies: DG Chest 2 View  Result Date: 04/06/2022 CLINICAL DATA:  Left-sided chest pain and shortness of breath. Duct of cough. EXAM: CHEST - 2 VIEW COMPARISON:  01/17/2022 and CT chest 03/17/2022. FINDINGS: Trachea is midline. Heart is enlarged, as before. Thoracic aorta is calcified. Subsegmental lingular atelectasis. No airspace consolidation. Minimal thickening of the left major fissure. IMPRESSION: No acute findings. Electronically Signed   By: Leanna Battles M.D.   On: 04/06/2022 08:08        Scheduled Meds:  albuterol  2.5 mg Nebulization BID   carvedilol  3.125 mg Oral BID WC   dapagliflozin propanediol  10 mg Oral Daily   enoxaparin (LOVENOX) injection  40 mg Subcutaneous Daily   fluticasone furoate-vilanterol  1 puff Inhalation Daily   And   umeclidinium bromide  1 puff Inhalation Daily   insulin aspart  0-15 Units Subcutaneous Q4H   sodium chloride flush  3 mL Intravenous Q12H   Continuous Infusions:   LOS:  1 day    Time spent: over 30 min    Lacretia Nicks, MD [facility]   To contact the attending provider between 7A-7P or the covering provider during after hours 7P-7A, please log into the web site [email or web address] and access using universal [place] password for that web site. If you do not have the password, please call the hospital operator.  04/07/2022, 4:20 PM

## 2022-04-07 NOTE — Progress Notes (Signed)
Mobility Specialist Progress Note:   04/07/22 1049  Mobility  Activity Transferred to/from BSC  Level of Assistance Contact guard assist, steadying assist  Assistive Device  (HHA)  Distance Ambulated (ft) 4 ft  Activity Response Tolerated well  $Mobility charge 1 Mobility   Pt in bed needing to use BSC. No complaints of pain. Left in bed with call bell in reach and all needs met.     Mobility Specialist  

## 2022-04-07 NOTE — Assessment & Plan Note (Addendum)
CP seems msk vs pleuritic/related to cough Elevated, but flat Appreciate cards recs, low suspicion for acs

## 2022-04-07 NOTE — Assessment & Plan Note (Addendum)
Requiring 4 L today, due to presumed HF exacerbation CXR without acute findings, though exam at presentation concerning for HF Mild wheezing at times, continue breo/incruse, give albuterol and follow - I don't think she needs steroids at this time Will repeat CXR today Low threshold for additional w/u (CT PE protocol)

## 2022-04-07 NOTE — Assessment & Plan Note (Addendum)
Patient presents with complaints of progressively worsening lower extremity swelling and shortness of breath over the last 2 weeks.   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   Cath without angiographically apparent CAD, well controlled R heart pressures.  Recommended continuing medical therapy for NICM.

## 2022-04-07 NOTE — Plan of Care (Signed)
  Problem: Education: Goal: Ability to demonstrate management of disease process will improve Outcome: Progressing Goal: Ability to verbalize understanding of medication therapies will improve Outcome: Progressing   Problem: Activity: Goal: Capacity to carry out activities will improve Outcome: Progressing   Problem: Coping: Goal: Ability to adjust to condition or change in health will improve Outcome: Progressing   Problem: Fluid Volume: Goal: Ability to maintain a balanced intake and output will improve Outcome: Progressing   Problem: Health Behavior/Discharge Planning: Goal: Ability to manage health-related needs will improve Outcome: Progressing

## 2022-04-07 NOTE — Assessment & Plan Note (Signed)
a1c 10/2021 6.2 Repeat SSI Started on farxiga

## 2022-04-08 DIAGNOSIS — R7989 Other specified abnormal findings of blood chemistry: Secondary | ICD-10-CM

## 2022-04-08 DIAGNOSIS — I5043 Acute on chronic combined systolic (congestive) and diastolic (congestive) heart failure: Secondary | ICD-10-CM | POA: Diagnosis not present

## 2022-04-08 DIAGNOSIS — R4589 Other symptoms and signs involving emotional state: Secondary | ICD-10-CM

## 2022-04-08 DIAGNOSIS — G9341 Metabolic encephalopathy: Secondary | ICD-10-CM

## 2022-04-08 LAB — RAPID URINE DRUG SCREEN, HOSP PERFORMED
Amphetamines: NOT DETECTED
Barbiturates: NOT DETECTED
Benzodiazepines: NOT DETECTED
Cocaine: POSITIVE — AB
Opiates: NOT DETECTED
Tetrahydrocannabinol: NOT DETECTED

## 2022-04-08 LAB — CBC WITH DIFFERENTIAL/PLATELET
Abs Immature Granulocytes: 0.04 10*3/uL (ref 0.00–0.07)
Basophils Absolute: 0.1 10*3/uL (ref 0.0–0.1)
Basophils Relative: 1 %
Eosinophils Absolute: 0.1 10*3/uL (ref 0.0–0.5)
Eosinophils Relative: 1 %
HCT: 45 % (ref 36.0–46.0)
Hemoglobin: 13.9 g/dL (ref 12.0–15.0)
Immature Granulocytes: 0 %
Lymphocytes Relative: 32 %
Lymphs Abs: 3.5 10*3/uL (ref 0.7–4.0)
MCH: 31 pg (ref 26.0–34.0)
MCHC: 30.9 g/dL (ref 30.0–36.0)
MCV: 100.2 fL — ABNORMAL HIGH (ref 80.0–100.0)
Monocytes Absolute: 0.8 10*3/uL (ref 0.1–1.0)
Monocytes Relative: 7 %
Neutro Abs: 6.5 10*3/uL (ref 1.7–7.7)
Neutrophils Relative %: 59 %
Platelets: 257 10*3/uL (ref 150–400)
RBC: 4.49 MIL/uL (ref 3.87–5.11)
RDW: 13.6 % (ref 11.5–15.5)
WBC: 11 10*3/uL — ABNORMAL HIGH (ref 4.0–10.5)
nRBC: 0 % (ref 0.0–0.2)

## 2022-04-08 LAB — COMPREHENSIVE METABOLIC PANEL
ALT: 64 U/L — ABNORMAL HIGH (ref 0–44)
AST: 44 U/L — ABNORMAL HIGH (ref 15–41)
Albumin: 2.4 g/dL — ABNORMAL LOW (ref 3.5–5.0)
Alkaline Phosphatase: 82 U/L (ref 38–126)
Anion gap: 11 (ref 5–15)
BUN: 30 mg/dL — ABNORMAL HIGH (ref 6–20)
CO2: 21 mmol/L — ABNORMAL LOW (ref 22–32)
Calcium: 8.5 mg/dL — ABNORMAL LOW (ref 8.9–10.3)
Chloride: 108 mmol/L (ref 98–111)
Creatinine, Ser: 1.04 mg/dL — ABNORMAL HIGH (ref 0.44–1.00)
GFR, Estimated: >60 mL/min (ref >60)
Glucose, Bld: 100 mg/dL — ABNORMAL HIGH (ref 70–99)
Potassium: 3.9 mmol/L (ref 3.5–5.1)
Sodium: 140 mmol/L (ref 135–145)
Total Bilirubin: 0.6 mg/dL (ref 0.3–1.2)
Total Protein: 4.4 g/dL — ABNORMAL LOW (ref 6.5–8.1)

## 2022-04-08 LAB — GLUCOSE, CAPILLARY
Glucose-Capillary: 159 mg/dL — ABNORMAL HIGH (ref 70–99)
Glucose-Capillary: 88 mg/dL (ref 70–99)
Glucose-Capillary: 98 mg/dL (ref 70–99)
Glucose-Capillary: 184 mg/dL — ABNORMAL HIGH (ref 70–99)
Glucose-Capillary: 142 mg/dL — ABNORMAL HIGH (ref 70–99)
Glucose-Capillary: 156 mg/dL — ABNORMAL HIGH (ref 70–99)
Glucose-Capillary: 161 mg/dL — ABNORMAL HIGH (ref 70–99)

## 2022-04-08 LAB — MAGNESIUM: Magnesium: 2.1 mg/dL (ref 1.7–2.4)

## 2022-04-08 LAB — BLOOD GAS, VENOUS
Acid-Base Excess: 2.6 mmol/L — ABNORMAL HIGH (ref 0.0–2.0)
Bicarbonate: 27.2 mmol/L (ref 20.0–28.0)
O2 Saturation: 98.5 %
Patient temperature: 36.9
pCO2, Ven: 41 mmHg — ABNORMAL LOW (ref 44–60)
pH, Ven: 7.43 (ref 7.25–7.43)
pO2, Ven: 99 mmHg — ABNORMAL HIGH (ref 32–45)

## 2022-04-08 LAB — PHOSPHORUS: Phosphorus: 4.7 mg/dL — ABNORMAL HIGH (ref 2.5–4.6)

## 2022-04-08 MED ORDER — FLUTICASONE FUROATE-VILANTEROL 200-25 MCG/ACT IN AEPB
1.0000 | INHALATION_SPRAY | Freq: Every day | RESPIRATORY_TRACT | Status: DC
Start: 2022-04-09 — End: 2022-04-13
  Administered 2022-04-09 – 2022-04-12 (×4): 1 via RESPIRATORY_TRACT

## 2022-04-08 MED ORDER — IPRATROPIUM-ALBUTEROL 0.5-2.5 (3) MG/3ML IN SOLN: 3.0000 mL | Freq: Four times a day (QID) | RESPIRATORY_TRACT | Status: DC

## 2022-04-08 MED ORDER — FUROSEMIDE 10 MG/ML IJ SOLN: 40.0000 mg | Freq: Every day | INTRAMUSCULAR | Status: DC

## 2022-04-08 MED ORDER — SPIRONOLACTONE 12.5 MG HALF TABLET: 12.5000 mg | ORAL_TABLET | Freq: Every day | ORAL | Status: DC

## 2022-04-08 MED ORDER — UMECLIDINIUM BROMIDE 62.5 MCG/ACT IN AEPB
1.0000 | INHALATION_SPRAY | Freq: Every day | RESPIRATORY_TRACT | Status: DC
Start: 2022-04-09 — End: 2022-04-13
  Administered 2022-04-09 – 2022-04-12 (×4): 1 via RESPIRATORY_TRACT
  Filled 2022-04-08: qty 7

## 2022-04-08 MED ORDER — NALOXONE HCL 0.4 MG/ML IJ SOLN: 0.4000 mg | INTRAMUSCULAR | Status: DC | PRN

## 2022-04-08 MED ORDER — FUROSEMIDE 40 MG PO TABS
40.0000 mg | ORAL_TABLET | Freq: Two times a day (BID) | ORAL | Status: DC
  Administered 2022-04-08 – 2022-04-10 (×4): 40 mg via ORAL
  Filled 2022-04-08 (×4): qty 1

## 2022-04-08 NOTE — TOC Progression Note (Signed)
Transition of Care [facility]) - Progression Note    Patient Details  Name: Stacey Lin MRN: [id number] Date of Birth: [birth year]  [facility] CM/SW Contact  Zenon Mayo, RN Phone Number: 04/08/2022, 4:35 PM  Clinical Narrative:     from home, PSA , sob, chf ex, ran out of meds,  having withdrawals sxs, diuresing, poss right heart cath, monitoring  liver enzymes. TOC will continue to follow for dc needs.        Expected Discharge Plan and Services                                                 Social Determinants of Health (SDOH) Interventions    Readmission Risk Interventions     No data to display

## 2022-04-08 NOTE — Progress Notes (Addendum)
Progress Note  Patient Name: Stacey Lin Date of Encounter: 04/08/2022  [facility] Cardiologist: None   Subjective   Upon entering the room, patient is lying naked and uncovered in the bed. Difficult to arouse, minimally responsive to pain. Dr. Jacques Navy notified, called rapid response and primary team. HR 80s, on 4 L oxygen via nasal cannula   Patient reports a mild headache, SOB. Believes her ankle edema has improved.   Inpatient Medications    Scheduled Meds:  carvedilol  3.125 mg Oral BID WC   dapagliflozin propanediol  10 mg Oral Daily   enoxaparin (LOVENOX) injection  40 mg Subcutaneous Daily   fluticasone furoate-vilanterol  1 puff Inhalation Daily   And   umeclidinium bromide  1 puff Inhalation Daily   insulin aspart  0-15 Units Subcutaneous Q4H   sodium chloride flush  3 mL Intravenous Q12H   Continuous Infusions:  PRN Meds: acetaminophen **OR** acetaminophen, albuterol, hydrALAZINE, LORazepam   Vital Signs    Vitals:   04/08/22 0020 04/08/22 0416 04/08/22 0722 04/08/22 0808  BP: 119/87 115/87 113/82   Pulse: 84 87 83   Resp: 20 20    Temp: 97.6 F (36.4 C) (!) 97.5 F (36.4 C)    TempSrc: Oral Oral    SpO2: 100% 100% 98% 100%  Weight:  74 kg    Height:        Intake/Output Summary (Last 24 hours) at 04/08/2022 1020 Last data filed at 04/08/2022 0420 Gross per 24 hour  Intake 960 ml  Output 1350 ml  Net -390 ml      04/08/2022    4:16 AM 04/07/2022   11:00 PM 04/07/2022    4:13 AM  Last 3 Weights  Weight (lbs) 163 lb 3.2 oz 163 lb 3.2 oz 160 lb 12.8 oz  Weight (kg) 74.027 kg 74.027 kg 72.938 kg      Telemetry    Sinus Rhythm, HR in the 80s-90s - Personally Reviewed  ECG    Sinus rhythm, prolonged QT (QT/QTcB 396/497 ms) - Personally Reviewed  Physical Exam   GEN: Somnolent, patient barely opens her eyes during our discussion and falls asleep quickly between questions. Answered questions appropriately   Neck: No JVD Cardiac: RRR.  Grade 2/6 systolic murmur over apex, and faint diastolic murmur at LUSB  Respiratory: Clear, on [place] with 2L/min oxygen  GI: Soft, mildly tender, non-distended  MS: Trace edema; No deformity. Extremities are warm and well perfused  Neuro:  Nonfocal  Psych: Normal affect   Labs    High Sensitivity Troponin:   Recent Labs  Lab 04/06/22 0740 04/06/22 0945  TROPONINIHS 46* 43*     Chemistry Recent Labs  Lab 04/06/22 0740 04/06/22 0820 04/07/22 0420 04/08/22 0341  NA 137 139  140 142 140  K 4.2 4.1  4.1 3.7 3.9  CL 107 108 104 108  CO2 19*  --  29 21*  GLUCOSE 285* 285* 125* 100*  BUN 21* 23* 26* 30*  CREATININE 1.04* 0.90 1.21* 1.04*  CALCIUM 8.7*  --  8.8* 8.5*  MG  --  1.9  --  2.1  PROT  --  4.7* 4.5* 4.4*  ALBUMIN  --  2.6* 2.4* 2.4*  AST  --  74* 32 44*  ALT  --  75* 61* 64*  ALKPHOS  --  89 85 82  BILITOT  --  0.7 0.7 0.6  GFRNONAA >60  --  52* >60  ANIONGAP 11  --  9  11    Lipids No results for input(s): "CHOL", "TRIG", "HDL", "LABVLDL", "LDLCALC", "CHOLHDL" in the last 168 hours.  Hematology Recent Labs  Lab 04/06/22 0740 04/06/22 0820 04/07/22 0420 04/08/22 0341  WBC 10.0  --  11.6* 11.0*  RBC 4.44  --  4.31 4.49  HGB 13.8 14.6  15.0 13.4 13.9  HCT 44.2 43.0  44.0 41.2 45.0  MCV 99.5  --  95.6 100.2*  MCH 31.1  --  31.1 31.0  MCHC 31.2  --  32.5 30.9  RDW 13.7  --  13.4 13.6  PLT 281  --  260 257   Thyroid  Recent Labs  Lab 04/06/22 1745  TSH 0.459    BNP Recent Labs  Lab 04/06/22 0740  BNP 1,508.2*    DDimer  Recent Labs  Lab 04/06/22 0820  DDIMER 0.56*     Radiology    ECHOCARDIOGRAM COMPLETE  Result Date: 04/07/2022    ECHOCARDIOGRAM REPORT   Patient Name:   Stacey Lin Date of Exam: 04/07/2022 Medical Rec #:  [id number]       Height:       60.0 in Accession #:    [id number]      Weight:       160.8 lb Date of Birth:  [birth year]       BSA:          1.701 m Patient Age:    56 years        BP:           124/84 mmHg Patient  Gender: F               HR:           91 bpm. Exam Location:  Inpatient Procedure: 2D Echo, Cardiac Doppler and Color Doppler Indications:    CHF  History:        Patient has prior history of Echocardiogram examinations. COPD;                 Risk Factors:Hypertension, Diabetes and Current Smoker.  Sonographer:    Cleatis Polka Referring Phys: [id number] RONDELL A SMITH IMPRESSIONS  1. Significant change in LVEF was 55-60% in 2019. Left ventricular ejection fraction, by estimation, is 30 to 35%. The left ventricle has moderately decreased function. The left ventricle demonstrates global hypokinesis. The left ventricular internal cavity size was moderately dilated. Left ventricular diastolic parameters are consistent with Grade I diastolic dysfunction (impaired relaxation).  2. Right ventricular systolic function is normal. The right ventricular size is normal. There is mildly elevated pulmonary artery systolic pressure.  3. Left atrial size was severely dilated.  4. Right atrial size was severely dilated.  5. The mitral valve is normal in structure. Moderate to severe mitral valve regurgitation. Mild mitral stenosis. The mean mitral valve gradient is 8.5 mmHg.  6. Tricuspid valve regurgitation is moderate to severe.  7. The aortic valve is normal in structure. Aortic valve regurgitation is moderate to severe. No aortic stenosis is present.  8. The inferior vena cava is normal in size with greater than 50% respiratory variability, suggesting right atrial pressure of 3 mmHg. Comparison(s): A prior study was performed on 04/10/2018. The left ventricular function is significantly worse. Aortic regugitation, mitral regurgitation and tricuspid regurgitation have worsened. FINDINGS  Left Ventricle: Significant change in LVEF was 55-60% in 2019. Left ventricular ejection fraction, by estimation, is 30 to 35%. The left ventricle has moderately decreased function. The left ventricle demonstrates global hypokinesis. The  left  ventricular internal cavity size was moderately dilated. There is no left ventricular hypertrophy. Left ventricular diastolic parameters are consistent with Grade I diastolic dysfunction (impaired relaxation). Right Ventricle: The right ventricular size is normal. No increase in right ventricular wall thickness. Right ventricular systolic function is normal. There is mildly elevated pulmonary artery systolic pressure. The tricuspid regurgitant velocity is 3.16  m/s, and with an assumed right atrial pressure of 3 mmHg, the estimated right ventricular systolic pressure is 42.9 mmHg. Left Atrium: Left atrial size was severely dilated. Right Atrium: Right atrial size was severely dilated. Pericardium: There is no evidence of pericardial effusion. Mitral Valve: The mitral valve is normal in structure. Moderate to severe mitral valve regurgitation. Mild mitral valve stenosis. MV peak gradient, 19.5 mmHg. The mean mitral valve gradient is 8.5 mmHg. Tricuspid Valve: The tricuspid valve is normal in structure. Tricuspid valve regurgitation is moderate to severe. No evidence of tricuspid stenosis. Aortic Valve: The aortic valve is normal in structure. Aortic valve regurgitation is moderate to severe. Aortic regurgitation PHT measures 457 msec. No aortic stenosis is present. Aortic valve peak gradient measures 8.6 mmHg. Pulmonic Valve: The pulmonic valve was not well visualized. Pulmonic valve regurgitation is mild. No evidence of pulmonic stenosis. Aorta: The aortic root is normal in size and structure. Venous: The inferior vena cava is normal in size with greater than 50% respiratory variability, suggesting right atrial pressure of 3 mmHg. IAS/Shunts: No atrial level shunt detected by color flow Doppler.  LEFT VENTRICLE PLAX 2D LVIDd:         6.10 cm      Diastology LVIDs:         5.40 cm      LV e' medial:    7.72 cm/s LV PW:         1.10 cm      LV E/e' medial:  23.2 LV IVS:        0.90 cm      LV e' lateral:   7.18 cm/s  LVOT diam:     2.00 cm      LV E/e' lateral: 24.9 LV SV:         37 LV SV Index:   22 LVOT Area:     3.14 cm  LV Volumes (MOD) LV vol d, MOD A2C: 206.0 ml LV vol d, MOD A4C: 156.0 ml LV vol s, MOD A2C: 131.0 ml LV vol s, MOD A4C: 105.0 ml LV SV MOD A2C:     75.0 ml LV SV MOD A4C:     156.0 ml LV SV MOD BP:      60.6 ml RIGHT VENTRICLE             IVC RV Basal diam:  3.80 cm     IVC diam: 1.60 cm RV Mid diam:    3.10 cm RV S prime:     10.20 cm/s TAPSE (M-mode): 2.6 cm LEFT ATRIUM              Index        RIGHT ATRIUM           Index LA diam:        5.10 cm  3.00 cm/m   RA Area:     27.10 cm LA Vol (A2C):   112.0 ml 65.83 ml/m  RA Volume:   91.70 ml  53.90 ml/m LA Vol (A4C):   72.3 ml  42.50 ml/m LA Biplane Vol: 90.6 ml  53.25 ml/m  AORTIC VALVE                 PULMONIC VALVE AV Area (Vmax): 1.71 cm     PR End Diast Vel: 2.04 msec AV Vmax:        147.00 cm/s AV Peak Grad:   8.6 mmHg LVOT Vmax:      80.00 cm/s LVOT Vmean:     59.500 cm/s LVOT VTI:       0.117 m AI PHT:         457 msec  AORTA Ao Root diam: 3.20 cm Ao Asc diam:  3.20 cm MITRAL VALVE                TRICUSPID VALVE MV Area (PHT): 4.31 cm     TR Peak grad:   39.9 mmHg MV Area VTI:   0.88 cm     TR Vmax:        316.00 cm/s MV Peak grad:  19.5 mmHg MV Mean grad:  8.5 mmHg     SHUNTS MV Vmax:       2.21 m/s     Systemic VTI:  0.12 m MV Vmean:      141.0 cm/s   Systemic Diam: 2.00 cm MV Decel Time: 176 msec MR Peak grad: 125.0 mmHg MR Mean grad: 83.0 mmHg MR Vmax:      559.00 cm/s MR Vmean:     441.0 cm/s MV E velocity: 179.00 cm/s MV A velocity: 126.00 cm/s MV E/A ratio:  1.42 Kardie Tobb DO Electronically signed by Thomasene Ripple DO Signature Date/Time: 04/07/2022/5:36:45 PM    Final     Cardiac Studies   Echocardiogram 04/07/22 1. Significant change in LVEF was 55-60% in 2019. Left ventricular  ejection fraction, by estimation, is 30 to 35%. The left ventricle has  moderately decreased function. The left ventricle demonstrates global   hypokinesis. The left ventricular internal  cavity size was moderately dilated. Left ventricular diastolic parameters  are consistent with Grade I diastolic dysfunction (impaired relaxation).   2. Right ventricular systolic function is normal. The right ventricular  size is normal. There is mildly elevated pulmonary artery systolic  pressure.   3. Left atrial size was severely dilated.   4. Right atrial size was severely dilated.   5. The mitral valve is normal in structure. Moderate to severe mitral  valve regurgitation. Mild mitral stenosis. The mean mitral valve gradient  is 8.5 mmHg.   6. Tricuspid valve regurgitation is moderate to severe.   7. The aortic valve is normal in structure. Aortic valve regurgitation is  moderate to severe. No aortic stenosis is present.   8. The inferior vena cava is normal in size with greater than 50%  respiratory variability, suggesting right atrial pressure of 3 mmHg.   Comparison(s): A prior study was performed on 04/10/2018. The left  ventricular function is significantly worse. Aortic regugitation, mitral  regurgitation and tricuspid regurgitation have worsened.   Patient Profile     56 y.o. female with a history of heart failure, HLD, HTN, mitral valve regurgitation, polysubstance abuse, asthma, anemia who presented with SOB, was seen 6/14 for evaluation of decompensated heart failure  Assessment & Plan    Altered Level of Consciousness  - Patient was very difficult to arouse. She was alert and oriented, but fell asleep quickly between questions. Laying in bed without hospital gown on, declined gown when I offered. Minimally responsive to pain  - Discussed with Dr. Jacques Navy who evaluated patient, called  rapid response, notified primary team  - Ordered urine drug screen, concerned patient could be using substances while admitted based on her presentation   Acute decompensated systolic heart failure  - Patient previously had a reduced EF of  20-25% in 03/2016. Echo in 03/2017 showed that EF had improved to 60-65% with grade I diastolic dysfunction  - Echo this admission showed EF 30-35%, grade I diastolic dysfunction, normal RV systolic function, severe dilation of LA and RA, moderate to severe MVR, moderate to severe  TVR, moderate to severe AI  - Suspect reduced EF is in part due to substance use and noncompliance with home medications. However patient has not recently had ischemic evaluation  - Consider right and left heart cath Monday-- will confirm with MD. Need to figure out why patient has an altered level of consciousness today before planning any ischemic eval  - Patient has been educated on the importance of medication compliance, stopping substance abuse.  - Has been given 2 doses lasix 40 mg IV so far-- output 1.35 L urine yesterday and is currently net -1.4 L since admission  - Creatinine bumped from 0.90>>1.21 after 2 doses IV lasix and the addition of SGLT2i. Held lasix yesterday, creatinine recovered to 1.04 today. Hold off on additional lasix until ALC resolved  - BP did fall with diuresis. add back GDMT as tolerated  - Continue farxiga 10 mg daily  - Resumed home carvedilol 3.125 mg BID yesterday  - Plan to restart spironolactone as BP improves, unsure if BP will be able to tolerate losartan  - Patient has been seen by [facility] in the past, consider consulting them this admission. However, as patient is noncompliant and has been using substances, not sure how much they could offer her     History of Rheumatic Valvular Heart Disease  - Most recent echocardiogram in 03/2018 showed mild-moderate aortic valve regurgitation, mild mitral valve regurgitation, moderate tricuspid valve regurgitation  - Echo this admission with evere dilation of LA and RA, moderate to severe MVR, moderate to severe  TVR, moderate to severe AI    Polysubstance Abuse - Continues to use cocaine and smoke cigarettes  - Patient has been  counseled on the importance of stopping these substances, and their negative effects on heart health    Elevated trop  - hsTn 46>>43 - Minimal elevation with flat trend-- not consistent with Acs, suspect secondary to demand in the setting of heart failure exacerbation     Otherwise Per primary  - Type 2 DM  - Transaminitis  - Hypoalbuminemia  - COPD     For questions or updates, please contact [facility] Please consult [email or web address] for contact info under        Signed, Jonita Albee, PA-C  04/08/2022, 10:20 AM    Patient seen and examined with KJ PA.  Agree as above, with the following exceptions and changes as noted below. Pt was minimally responsive to painful stimuli upon my exam, RRT nurse called, greatly appreciate her review. Pt neuro intact but quite sedate, concern for neuro event though she cleared quickly. Gen: NAD, CV: RRR, no murmurs, Lungs: clear, Abd: soft, Extrem: Warm, well perfused, 1+ pedal edema, Neuro/Psych: drowsy but arousable and oriented x 3, normal mood and affect. All available labs, radiology testing, previous records reviewed. She is quite sad and tearful on exam today given significant life stressors. May need psych involvement. EF low again from drug use presumably. Committed to quitting per her report.  Continue diuresis.   Parke Poisson, MD 04/08/22 2:21 PM

## 2022-04-08 NOTE — Significant Event (Signed)
Rapid Response Event Note   Reason for Call :  Difficult to arouse when Cardiology assessed patient.  Initial Focused Assessment:  Upon my arrival she is difficult to arouse but she will verbally tell me to stop and swat my hand away.  After repeated interaction she was fully awake and able to have conversation with Cardiologist. No focal deficit  BP 133/94  HR 68  RR 20  O2 sat 97% on 4L [place]  She denies taking any medication that is not on the [facility].  Dr Margaretann Loveless at bedside Dr Florene Glen came to bedside  Interventions:    Plan of Care:  RN to call if needed   Event Summary:   MD Notified: Dr Florene Glen Call Time: [place] Time: [id number] End Time:  [place]  Raliegh Ip, RN

## 2022-04-08 NOTE — Assessment & Plan Note (Addendum)
Will ask psych to see, sounds like lots of social stressors She wasn't initially able to say whether she was having any SI Addendum: evaluated later in afternoon (6/16), this time, she clearly states no desire to hurt herself or end her life.  She's still open to psychiatry seeing her. Psych recommending lexapro

## 2022-04-08 NOTE — Assessment & Plan Note (Addendum)
Called to bedside due to encephalopathy She was coming around by the time I got there, but was described as somnolent in cards notes VBG without hypercarbia tox screen remain positive only for cocaine Unclear cause of her encephalopathy, she got ativan late last night? Possible contributor?  I don't see other possibly contributing meds.   Delirium precautions Narcan prn  Follow closely

## 2022-04-08 NOTE — Progress Notes (Addendum)
PROGRESS NOTE    Stacey Lin  GNF:[id number] DOB: January 26, 1966 DOA: 04/06/2022 PCP: Grayce Sessions, NP  Chief Complaint  Patient presents with   Shortness of Breath    Brief Narrative:  Stacey Lin is Stacey Lin 56 y.o. female with medical history significant of hypertension, hyperlipidemia, congestive heart failure, diabetes mellitus type 2, COPD, and prior tobacco abuse who presents with complaints of shortness of breath progressively worsening over the last 2 weeks.  She complains of having progressively worsening swelling of her feet to the point which she was unable to see her ankles.  Patient admits that she had ran out of Kel Senn lot of her medications, but states that she is not quite sure which ones.  She states she is followed by Dr. Gala Romney and has Najee Manninen history of heart failure with leaky valve.  She has had some intermittent left-sided chest pains and wheezing in addition to her shortness of breath.  Patient reports that she fell twice due to feeling lightheaded related to her symptoms, and has been having right side pain.  Upon further questioning patient does admit to recently using cocaine Jansen Goodpasture couple days ago and still intermittently smokes cigarettes from time to time.  Denies having any significant fever, nausea, vomiting, or diarrhea symptoms.   Upon admission into the emergency department patient was seen to be afebrile with blood pressures elevated up to 168/120, respirations 15-26, and all other vital signs maintained.  Patient was placed on BiPAP due to work of breathing, but was never documented to have drop in O2 saturations.  Labs significant for glucose 285, albumin 2.6, AST 74, ALT 75, D-dimer 0.56, and high-sensitivity troponin 46->43.  Chest x-ray noted enlarged heart with no acute findings.  BNP had not resulted.  Patient had been given 125 mg of Solu-Medrol IV, DuoNeb breathing treatment, and Lasix 40 mg IV.      Assessment & Plan:   Principal Problem:   Acute exacerbation  of CHF (congestive heart failure) (HCC) Active Problems:   Acute metabolic encephalopathy   Acute respiratory failure with hypoxia (HCC)   Elevated troponin   COPD without exacerbation (HCC)   Essential hypertension, benign   Controlled type 2 diabetes mellitus with hyperglycemia (HCC)   Transaminitis   Hypoalbuminemia   Polysubstance abuse (HCC)   Headache   Elevated LFTs   Obesity (BMI 30-39.9)   Depressed mood   Assessment and Plan: * Acute exacerbation of CHF (congestive heart failure) (HCC) Patient presents with complaints of progressively worsening lower extremity swelling and shortness of breath over the last 2 weeks.   CXR 6/15 without acute findings Initially required bipap Improved edema today BNP was elevated at 1508.2 echocardiogram from 2019 noted EF of 55-65% with grade 1 diastolic dysfunction Repeat echo with EF 30-35%, grade 1 diastolic dysfunction, moderate to severe MVR, moderate to severe TVR, moderate to severe AVR (see report) Diuresis per cardiology Coreg, farxiga Appreciate cardiology recs - planning to resume GDMT as tolerated  Acute metabolic encephalopathy Called to bedside due to encephalopathy She was coming around by the time I got there, but was described as somnolent in cards notes VBG without hypercarbia tox screen remain positive only for cocaine Unclear cause of her encephalopathy, she got ativan late last night? Possible contributor?  I don't see other possibly contributing meds.   Delirium precautions Narcan prn  Follow closely    Acute respiratory failure with hypoxia (HCC) Requiring 4 L today, due to presumed HF exacerbation CXR without acute findings, though  exam at presentation concerning for HF Mild wheezing at times, continue breo/incruse, give albuterol and follow - I don't think she needs steroids at this time Will repeat CXR today Low threshold for additional w/u (CT PE protocol)  Elevated troponin CP seems msk vs  pleuritic/related to cough Elevated, but flat Appreciate cards recs, low suspicion for acs   COPD without exacerbation (HCC) Continue home meds Mild wheezing as above S/p solumedrol in ED, I don't think she needs steroids, but will continue nebs for now  Essential hypertension, benign Coreg Follow for adjustments  Controlled type 2 diabetes mellitus with hyperglycemia (HCC) a1c 10/2021 6.2 Repeat SSI Started on farxiga  Transaminitis Related to HF? Cocaine use? Negative acute hepatitis panel trend  Hypoalbuminemia Follow RD  Polysubstance abuse (HCC) utox positive for cocaine, encourage cessation  Headache Will continue to monitor Treat symptomatically  Depressed mood Will ask psych to see, sounds like lots of social stressors She wasn't able to say whether she was having any SI Addendum: evaluated later in afternoon, this time, she clearly states no desire to hurt herself or end her life.  She's still open to psychiatry seeing her.  Obesity (BMI 30-39.9) noted     DVT prophylaxis: lovenox Code Status: full Family Communication: none Disposition:   Status is: Inpatient Remains inpatient appropriate because: need for further management by cards, encephalopathy   Consultants:  Cards psych  Procedures:  IMPRESSIONS     1. Significant change in LVEF was 55-60% in 2019. Left ventricular  ejection fraction, by estimation, is 30 to 35%. The left ventricle has  moderately decreased function. The left ventricle demonstrates global  hypokinesis. The left ventricular internal  cavity size was moderately dilated. Left ventricular diastolic parameters  are consistent with Grade I diastolic dysfunction (impaired relaxation).   2. Right ventricular systolic function is normal. The right ventricular  size is normal. There is mildly elevated pulmonary artery systolic  pressure.   3. Left atrial size was severely dilated.   4. Right atrial size was severely  dilated.   5. The mitral valve is normal in structure. Moderate to severe mitral  valve regurgitation. Mild mitral stenosis. The mean mitral valve gradient  is 8.5 mmHg.   6. Tricuspid valve regurgitation is moderate to severe.   7. The aortic valve is normal in structure. Aortic valve regurgitation is  moderate to severe. No aortic stenosis is present.   8. The inferior vena cava is normal in size with greater than 50%  respiratory variability, suggesting right atrial pressure of 3 mmHg.   Comparison(s): Jenniferlynn Saad prior study was performed on 04/10/2018. The left  ventricular function is significantly worse. Aortic regugitation, mitral  regurgitation and tricuspid regurgitation have worsened.   Antimicrobials:  Anti-infectives (From admission, onward)    None       Subjective: Notes depressed mood, can't elaborate regarding SI Notes SOB  Objective: Vitals:   04/08/22 0416 04/08/22 0722 04/08/22 0808 04/08/22 1129  BP: 115/87 113/82  (!) 133/94  Pulse: 87 83  88  Resp: 20   20  Temp: (!) 97.5 F (36.4 C)   98.5 F (36.9 C)  TempSrc: Oral   Oral  SpO2: 100% 98% 100% 97%  Weight: 74 kg     Height:        Intake/Output Summary (Last 24 hours) at 04/08/2022 1748 Last data filed at 04/08/2022 1656 Gross per 24 hour  Intake 720 ml  Output 1300 ml  Net -580 ml   Filed  Weights   04/07/22 0413 04/07/22 2300 04/08/22 0416  Weight: 72.9 kg 74 kg 74 kg    Examination:  General exam: Appears calm and comfortable  Respiratory system: mild wheezing Cardiovascular system: RRR Gastrointestinal system: Abdomen is nondistended, soft and nontender Central nervous system: Alert and oriented by the time I saw her. No focal neurological deficits. Extremities: no LEE    Data Reviewed: I have personally reviewed following labs and imaging studies  CBC: Recent Labs  Lab 04/06/22 0740 04/06/22 0820 04/07/22 0420 04/08/22 0341  WBC 10.0  --  11.6* 11.0*  NEUTROABS  --   --   --   6.5  HGB 13.8 14.6  15.0 13.4 13.9  HCT 44.2 43.0  44.0 41.2 45.0  MCV 99.5  --  95.6 100.2*  PLT 281  --  260 257    Basic Metabolic Panel: Recent Labs  Lab 04/06/22 0740 04/06/22 0820 04/07/22 0420 04/08/22 0341  NA 137 139  140 142 140  K 4.2 4.1  4.1 3.7 3.9  CL 107 108 104 108  CO2 19*  --  29 21*  GLUCOSE 285* 285* 125* 100*  BUN 21* 23* 26* 30*  CREATININE 1.04* 0.90 1.21* 1.04*  CALCIUM 8.7*  --  8.8* 8.5*  MG  --  1.9  --  2.1  PHOS  --   --   --  4.7*    GFR: Estimated Creatinine Clearance: 53.6 mL/min (Angi Goodell) (by C-G formula based on SCr of 1.04 mg/dL (H)).  Liver Function Tests: Recent Labs  Lab 04/06/22 0820 04/07/22 0420 04/08/22 0341  AST 74* 32 44*  ALT 75* 61* 64*  ALKPHOS 89 85 82  BILITOT 0.7 0.7 0.6  PROT 4.7* 4.5* 4.4*  ALBUMIN 2.6* 2.4* 2.4*    CBG: Recent Labs  Lab 04/08/22 0156 04/08/22 0408 04/08/22 0609 04/08/22 1128 04/08/22 1702  GLUCAP 159* 88 98 184* 142*     Recent Results (from the past 240 hour(s))  Resp Panel by RT-PCR (Flu Taz Vanness&B, Covid) Anterior Nasal Swab     Status: None   Collection Time: 04/06/22  5:17 PM   Specimen: Anterior Nasal Swab  Result Value Ref Range Status   SARS Coronavirus 2 by RT PCR NEGATIVE NEGATIVE Final    Comment: (NOTE) SARS-CoV-2 target nucleic acids are NOT DETECTED.  The SARS-CoV-2 RNA is generally detectable in upper respiratory specimens during the acute phase of infection. The lowest concentration of SARS-CoV-2 viral copies this assay can detect is 138 copies/mL. Louie Meaders negative result does not preclude SARS-Cov-2 infection and should not be used as the sole basis for treatment or other patient management decisions. Cally Nygard negative result may occur with  improper specimen collection/handling, submission of specimen other than nasopharyngeal swab, presence of viral mutation(s) within the areas targeted by this assay, and inadequate number of viral copies(<138 copies/mL). Bertin Inabinet negative result must  be combined with clinical observations, patient history, and epidemiological information. The expected result is Negative.  Fact Sheet for Patients:  BloggerCourse.com  Fact Sheet for Healthcare Providers:  SeriousBroker.it  This test is no t yet approved or cleared by the [place] FDA and  has been authorized for detection and/or diagnosis of SARS-CoV-2 by FDA under an Emergency Use Authorization (EUA). This EUA will remain  in effect (meaning this test can be used) for the duration of the COVID-19 declaration under Section 564(b)(1) of the Act, 21 U.S.C.section 360bbb-3(b)(1), unless the authorization is terminated  or revoked sooner.  Influenza Yakov Bergen by PCR NEGATIVE NEGATIVE Final   Influenza B by PCR NEGATIVE NEGATIVE Final    Comment: (NOTE) The Xpert Xpress SARS-CoV-2/FLU/RSV plus assay is intended as an aid in the diagnosis of influenza from Nasopharyngeal swab specimens and should not be used as Arbutus Nelligan sole basis for treatment. Nasal washings and aspirates are unacceptable for Xpert Xpress SARS-CoV-2/FLU/RSV testing.  Fact Sheet for Patients: BloggerCourse.com  Fact Sheet for Healthcare Providers: SeriousBroker.it  This test is not yet approved or cleared by the [place] FDA and has been authorized for detection and/or diagnosis of SARS-CoV-2 by FDA under an Emergency Use Authorization (EUA). This EUA will remain in effect (meaning this test can be used) for the duration of the COVID-19 declaration under Section 564(b)(1) of the Act, 21 U.S.C. section 360bbb-3(b)(1), unless the authorization is terminated or revoked.  Performed at [facility], [address] [place], [place] [address]          Radiology Studies: ECHOCARDIOGRAM COMPLETE  Result Date: 04/07/2022    ECHOCARDIOGRAM REPORT   Patient Name:   Stacey Lin Date of Exam: 04/07/2022  Medical Rec #:  [id number]       Height:       60.0 in Accession #:    [id number]      Weight:       160.8 lb Date of Birth:  [birth year]       BSA:          1.701 m Patient Age:    55 years        BP:           124/84 mmHg Patient Gender: F               HR:           91 bpm. Exam Location:  Inpatient Procedure: 2D Echo, Cardiac Doppler and Color Doppler Indications:    CHF  History:        Patient has prior history of Echocardiogram examinations. COPD;                 Risk Factors:Hypertension, Diabetes and Current Smoker.  Sonographer:    Cleatis Polka Referring Phys: [id number] RONDELL Enslie Sahota SMITH IMPRESSIONS  1. Significant change in LVEF was 55-60% in 2019. Left ventricular ejection fraction, by estimation, is 30 to 35%. The left ventricle has moderately decreased function. The left ventricle demonstrates global hypokinesis. The left ventricular internal cavity size was moderately dilated. Left ventricular diastolic parameters are consistent with Grade I diastolic dysfunction (impaired relaxation).  2. Right ventricular systolic function is normal. The right ventricular size is normal. There is mildly elevated pulmonary artery systolic pressure.  3. Left atrial size was severely dilated.  4. Right atrial size was severely dilated.  5. The mitral valve is normal in structure. Moderate to severe mitral valve regurgitation. Mild mitral stenosis. The mean mitral valve gradient is 8.5 mmHg.  6. Tricuspid valve regurgitation is moderate to severe.  7. The aortic valve is normal in structure. Aortic valve regurgitation is moderate to severe. No aortic stenosis is present.  8. The inferior vena cava is normal in size with greater than 50% respiratory variability, suggesting right atrial pressure of 3 mmHg. Comparison(s): Damani Kelemen prior study was performed on 04/10/2018. The left ventricular function is significantly worse. Aortic regugitation, mitral regurgitation and tricuspid regurgitation have worsened. FINDINGS  Left Ventricle:  Significant change in LVEF was 55-60% in 2019. Left ventricular ejection fraction, by estimation, is 30  to 35%. The left ventricle has moderately decreased function. The left ventricle demonstrates global hypokinesis. The left ventricular internal cavity size was moderately dilated. There is no left ventricular hypertrophy. Left ventricular diastolic parameters are consistent with Grade I diastolic dysfunction (impaired relaxation). Right Ventricle: The right ventricular size is normal. No increase in right ventricular wall thickness. Right ventricular systolic function is normal. There is mildly elevated pulmonary artery systolic pressure. The tricuspid regurgitant velocity is 3.16  m/s, and with an assumed right atrial pressure of 3 mmHg, the estimated right ventricular systolic pressure is 42.9 mmHg. Left Atrium: Left atrial size was severely dilated. Right Atrium: Right atrial size was severely dilated. Pericardium: There is no evidence of pericardial effusion. Mitral Valve: The mitral valve is normal in structure. Moderate to severe mitral valve regurgitation. Mild mitral valve stenosis. MV peak gradient, 19.5 mmHg. The mean mitral valve gradient is 8.5 mmHg. Tricuspid Valve: The tricuspid valve is normal in structure. Tricuspid valve regurgitation is moderate to severe. No evidence of tricuspid stenosis. Aortic Valve: The aortic valve is normal in structure. Aortic valve regurgitation is moderate to severe. Aortic regurgitation PHT measures 457 msec. No aortic stenosis is present. Aortic valve peak gradient measures 8.6 mmHg. Pulmonic Valve: The pulmonic valve was not well visualized. Pulmonic valve regurgitation is mild. No evidence of pulmonic stenosis. Aorta: The aortic root is normal in size and structure. Venous: The inferior vena cava is normal in size with greater than 50% respiratory variability, suggesting right atrial pressure of 3 mmHg. IAS/Shunts: No atrial level shunt detected by color flow  Doppler.  LEFT VENTRICLE PLAX 2D LVIDd:         6.10 cm      Diastology LVIDs:         5.40 cm      LV e' medial:    7.72 cm/s LV PW:         1.10 cm      LV E/e' medial:  23.2 LV IVS:        0.90 cm      LV e' lateral:   7.18 cm/s LVOT diam:     2.00 cm      LV E/e' lateral: 24.9 LV SV:         37 LV SV Index:   22 LVOT Area:     3.14 cm  LV Volumes (MOD) LV vol d, MOD A2C: 206.0 ml LV vol d, MOD A4C: 156.0 ml LV vol s, MOD A2C: 131.0 ml LV vol s, MOD A4C: 105.0 ml LV SV MOD A2C:     75.0 ml LV SV MOD A4C:     156.0 ml LV SV MOD BP:      60.6 ml RIGHT VENTRICLE             IVC RV Basal diam:  3.80 cm     IVC diam: 1.60 cm RV Mid diam:    3.10 cm RV S prime:     10.20 cm/s TAPSE (M-mode): 2.6 cm LEFT ATRIUM              Index        RIGHT ATRIUM           Index LA diam:        5.10 cm  3.00 cm/m   RA Area:     27.10 cm LA Vol (A2C):   112.0 ml 65.83 ml/m  RA Volume:   91.70 ml  53.90 ml/m LA Vol (A4C):  72.3 ml  42.50 ml/m LA Biplane Vol: 90.6 ml  53.25 ml/m  AORTIC VALVE                 PULMONIC VALVE AV Area (Vmax): 1.71 cm     PR End Diast Vel: 2.04 msec AV Vmax:        147.00 cm/s AV Peak Grad:   8.6 mmHg LVOT Vmax:      80.00 cm/s LVOT Vmean:     59.500 cm/s LVOT VTI:       0.117 m AI PHT:         457 msec  AORTA Ao Root diam: 3.20 cm Ao Asc diam:  3.20 cm MITRAL VALVE                TRICUSPID VALVE MV Area (PHT): 4.31 cm     TR Peak grad:   39.9 mmHg MV Area VTI:   0.88 cm     TR Vmax:        316.00 cm/s MV Peak grad:  19.5 mmHg MV Mean grad:  8.5 mmHg     SHUNTS MV Vmax:       2.21 m/s     Systemic VTI:  0.12 m MV Vmean:      141.0 cm/s   Systemic Diam: 2.00 cm MV Decel Time: 176 msec MR Peak grad: 125.0 mmHg MR Mean grad: 83.0 mmHg MR Vmax:      559.00 cm/s MR Vmean:     441.0 cm/s MV E velocity: 179.00 cm/s MV Maryssa Giampietro velocity: 126.00 cm/s MV E/Kedar Sedano ratio:  1.42 Kardie Tobb DO Electronically signed by Thomasene Ripple DO Signature Date/Time: 04/07/2022/5:36:45 PM    Final         Scheduled Meds:   carvedilol  3.125 mg Oral BID WC   dapagliflozin propanediol  10 mg Oral Daily   enoxaparin (LOVENOX) injection  40 mg Subcutaneous Daily   fluticasone furoate-vilanterol  1 puff Inhalation Daily   And   umeclidinium bromide  1 puff Inhalation Daily   furosemide  40 mg Oral BID   insulin aspart  0-15 Units Subcutaneous Q4H   sodium chloride flush  3 mL Intravenous Q12H   Continuous Infusions:   LOS: 2 days    Time spent: over 30 min    Lacretia Nicks, MD [facility]   To contact the attending provider between 7A-7P or the covering provider during after hours 7P-7A, please log into the web site [email or web address] and access using universal [place] password for that web site. If you do not have the password, please call the hospital operator.  04/08/2022, 5:48 PM

## 2022-04-09 DIAGNOSIS — I5043 Acute on chronic combined systolic (congestive) and diastolic (congestive) heart failure: Secondary | ICD-10-CM | POA: Diagnosis not present

## 2022-04-09 DIAGNOSIS — F1910 Other psychoactive substance abuse, uncomplicated: Secondary | ICD-10-CM | POA: Diagnosis not present

## 2022-04-09 DIAGNOSIS — R778 Other specified abnormalities of plasma proteins: Secondary | ICD-10-CM | POA: Diagnosis not present

## 2022-04-09 DIAGNOSIS — I10 Essential (primary) hypertension: Secondary | ICD-10-CM | POA: Diagnosis not present

## 2022-04-09 LAB — COMPREHENSIVE METABOLIC PANEL
ALT: 59 U/L — ABNORMAL HIGH (ref 0–44)
AST: 31 U/L (ref 15–41)
Albumin: 2.5 g/dL — ABNORMAL LOW (ref 3.5–5.0)
Alkaline Phosphatase: 87 U/L (ref 38–126)
Anion gap: 12 (ref 5–15)
BUN: 32 mg/dL — ABNORMAL HIGH (ref 6–20)
CO2: 28 mmol/L (ref 22–32)
Calcium: 8.3 mg/dL — ABNORMAL LOW (ref 8.9–10.3)
Chloride: 99 mmol/L (ref 98–111)
Creatinine, Ser: 1.07 mg/dL — ABNORMAL HIGH (ref 0.44–1.00)
GFR, Estimated: >60 mL/min (ref >60)
Glucose, Bld: 105 mg/dL — ABNORMAL HIGH (ref 70–99)
Potassium: 3.8 mmol/L (ref 3.5–5.1)
Sodium: 139 mmol/L (ref 135–145)
Total Bilirubin: 0.7 mg/dL (ref 0.3–1.2)
Total Protein: 4.7 g/dL — ABNORMAL LOW (ref 6.5–8.1)

## 2022-04-09 LAB — CBC WITH DIFFERENTIAL/PLATELET
Abs Immature Granulocytes: 0.05 10*3/uL (ref 0.00–0.07)
Basophils Absolute: 0.1 10*3/uL (ref 0.0–0.1)
Basophils Relative: 1 %
Eosinophils Absolute: 0.1 10*3/uL (ref 0.0–0.5)
Eosinophils Relative: 1 %
HCT: 44.2 % (ref 36.0–46.0)
Hemoglobin: 13.7 g/dL (ref 12.0–15.0)
Immature Granulocytes: 1 %
Lymphocytes Relative: 26 %
Lymphs Abs: 2.5 10*3/uL (ref 0.7–4.0)
MCH: 30.1 pg (ref 26.0–34.0)
MCHC: 31 g/dL (ref 30.0–36.0)
MCV: 97.1 fL (ref 80.0–100.0)
Monocytes Absolute: 0.8 10*3/uL (ref 0.1–1.0)
Monocytes Relative: 8 %
Neutro Abs: 6.2 10*3/uL (ref 1.7–7.7)
Neutrophils Relative %: 63 %
Platelets: 265 10*3/uL (ref 150–400)
RBC: 4.55 MIL/uL (ref 3.87–5.11)
RDW: 13.3 % (ref 11.5–15.5)
WBC: 9.7 10*3/uL (ref 4.0–10.5)
nRBC: 0 % (ref 0.0–0.2)

## 2022-04-09 LAB — HEMOGLOBIN A1C
Hgb A1c MFr Bld: 6.4 % — ABNORMAL HIGH (ref 4.8–5.6)
Mean Plasma Glucose: 136.98 mg/dL

## 2022-04-09 LAB — GLUCOSE, CAPILLARY
Glucose-Capillary: 171 mg/dL — ABNORMAL HIGH (ref 70–99)
Glucose-Capillary: 97 mg/dL (ref 70–99)
Glucose-Capillary: 154 mg/dL — ABNORMAL HIGH (ref 70–99)
Glucose-Capillary: 237 mg/dL — ABNORMAL HIGH (ref 70–99)

## 2022-04-09 LAB — PHOSPHORUS: Phosphorus: 4.1 mg/dL (ref 2.5–4.6)

## 2022-04-09 LAB — MAGNESIUM: Magnesium: 2.4 mg/dL (ref 1.7–2.4)

## 2022-04-09 MED ORDER — ESCITALOPRAM OXALATE 10 MG PO TABS
20.0000 mg | ORAL_TABLET | Freq: Every day | ORAL | Status: DC
  Administered 2022-04-09 – 2022-04-13 (×5): 20 mg via ORAL
  Filled 2022-04-09 (×5): qty 2

## 2022-04-09 MED ORDER — SODIUM CHLORIDE 0.9% FLUSH
3.0000 mL | Freq: Two times a day (BID) | INTRAVENOUS | Status: DC
  Administered 2022-04-09 – 2022-04-11 (×4): 3 mL via INTRAVENOUS

## 2022-04-09 MED ORDER — GLUCERNA SHAKE PO LIQD
237.0000 mL | Freq: Three times a day (TID) | ORAL | Status: DC
  Administered 2022-04-09 – 2022-04-13 (×11): 237 mL via ORAL

## 2022-04-09 MED ORDER — ADULT MULTIVITAMIN W/MINERALS CH
1.0000 | ORAL_TABLET | Freq: Every day | ORAL | Status: DC
  Administered 2022-04-09 – 2022-04-13 (×5): 1 via ORAL
  Filled 2022-04-09 (×5): qty 1

## 2022-04-09 NOTE — Progress Notes (Signed)
Initial Nutrition Assessment  DOCUMENTATION CODES:   Obesity unspecified  INTERVENTION:   -MVI with minerals daily -Glucerna Shake po TID, each supplement provides 220 kcal and 10 grams of protein  -Liberalize diet to 2 gram sodium for wider variety of meal selections  NUTRITION DIAGNOSIS:   Increased nutrient needs related to chronic illness (CHF) as evidenced by estimated needs.  GOAL:   Patient will meet greater than or equal to 90% of their needs  MONITOR:   PO intake, Supplement acceptance  REASON FOR ASSESSMENT:   Consult Assessment of nutrition requirement/status  ASSESSMENT:   Pt with medical history significant of hypertension, hyperlipidemia, congestive heart failure, diabetes mellitus type 2, COPD, and prior tobacco abuse who presents with complaints of shortness of breath progressively worsening over the last 2 weeks  Pt admitted with CHF exacerbation.   Reviewed I/O's: -2.3 L x 24 hours and -3.7 L since admission  UOP: 3.5 L x 24 hours  Pt unavailable at time of visit. Attempted to speak with pt via call to hospital room phone, however, unable to reach. RD unable to obtain further nutrition-related history or complete nutrition-focused physical exam at this time.    Pt currently on a heart healthy, carb modified diet. Meal completions 50-100%.   Plan for heart cath on Monday, 04/11/22.   Reviewed wt hx; pt has experienced a 5.6% wt loss over the past 5 months, which is not significant for time frame.   Medications reviewed and include farxiga, lovenox, and lasix.   Obesity is a complex, chronic medical condition that is optimally managed by a multidisciplinary care team. Weight loss is not an ideal goal for an acute inpatient hospitalization. However, if further work-up for obesity is warranted, consider outpatient referral to outpatient bariatric service and/or [place].    Lab Results  Component Value  Date   HGBA1C 6.4 (H) 04/09/2022   PTA DM medications are 500 mg metformin daily.   Labs reviewed: CBGS: 97-171 (inpatient orders for glycemic control are 0-15 units insulin aspart every 4 hours).    Diet Order:   Diet Order             Diet heart healthy/carb modified Room service appropriate? Yes; Fluid consistency: Thin  Diet effective now                   EDUCATION NEEDS:   No education needs have been identified at this time  Skin:  Skin Assessment: Reviewed RN Assessment  Last BM:  04/08/22  Height:   Ht Readings from Last 1 Encounters:  04/06/22 5' (1.524 m)    Weight:   Wt Readings from Last 1 Encounters:  04/09/22 71.4 kg    Ideal Body Weight:  45.5 kg  BMI:  Body mass index is 30.76 kg/m.  Estimated Nutritional Needs:   Kcal:  1400-1600  Protein:  70-85 grams  Fluid:  > 1.4 L    Loistine Chance, RD, LDN, [place] Registered Dietitian II Certified Diabetes Care and Education Specialist Please refer to [facility] for RD and/or RD on-call/weekend/after hours pager

## 2022-04-09 NOTE — Consult Note (Signed)
[facility] Face-to-Face Psychiatry Consult   Reason for Consult:  "depression, i asked about SI, she was unable to answer" Referring Physician:  Dr. Florene Glen Patient Identification: Stacey Lin MRN:  [id number] Principal Diagnosis: Acute exacerbation of CHF (congestive heart failure) ([place]) Diagnosis:  Principal Problem:   Acute exacerbation of CHF (congestive heart failure) ([place]) Active Problems:   Essential hypertension, benign   COPD without exacerbation ([place])   Acute respiratory failure with hypoxia ([place])   Polysubstance abuse ([place])   Elevated troponin   Controlled type 2 diabetes mellitus with hyperglycemia (HCC)   Transaminitis   Hypoalbuminemia   Headache   Obesity (BMI 30-39.9)   Elevated LFTs   Acute metabolic encephalopathy   Depressed mood   Total Time spent with patient: 45 minutes  Subjective:   Stacey Lin is a 56 y.o. female patient admitted with CHF exacerbation after presenting to the ED on 6/14 for SOB and chest pain.Psychiatry was consulted for the above reason on 6/16 after a rapid response was called.  Patient seen today in her room.She was found sitting with staff member removing IV. Upon introduction, patient is agreeable to speaking with psychiatry. Patient recounts when led to hospitalization from a medical standpoint and she indicates that she will have a "heart procedure" on Monday. When asked about her mood, p[atient states that "I'm just in a bad place. I wish I could do things over". She reports that she has been feeling this was "for a couple years". She cites her more recent stressors as her dog passing away, her best friend passing away, and a friend and her daughter recently moving into her house who fight constantly. She expresses frustration regarding lettering her friend and her daughter move in and that they "tore up the house". In discussing stressors, patient becomes increasingly tearful and reports that her nephew was killed last night in a  shooting. Patient states that she sees a primary care provider wh pprescribes her medication. She is unable to recall the name but reports that she is compliant with it. Patient denies SI/HI/AVH. She denies sx c/w mania. No paranoia/IOR/TI/TB/TW. She reports intermittent cocaine use which she last used ~1 week ago. She states that she uses "when I am in a bad place" and that cocaine use leads to hallucinations. Patient expresses desire to speak with a counselor/therapist after discharge. She states that she previously saw a "drug counselor". . Discussed while inpatient that a chaplain consult could be helpful and she is amenable.     Past Psychiatric History: Previous Medication Trials: yes "can't remember the name". Per med rec, was recently on lexapro Previous Psychiatric Hospitalizations: denied Previous Suicide Attempts: denies History of Violence: denies Outpatient psychiatrist: no; medications prescribed by PCP  Social History: Patient states that she lives in her own home and that her friend and friend's daughter recently moved in-is a source of stress States that her mother in law lives nearby and cites her as a source of support  Substance Use (with emphasis over the last 12 months) Recreational Drugs: cocaine intermittently  Use of Alcohol: denied Tobacco Use: no Rehab History: no H/O Complicated Withdrawal: no  Family Psychiatric History: None reported  HPI per primary:    Stacey Lin is a 56 y.o. female with medical history significant of hypertension, hyperlipidemia, congestive heart failure, diabetes mellitus type 2, COPD, and prior tobacco abuse who presents with complaints of shortness of breath progressively worsening over the last 2 weeks.  She complains of having progressively worsening swelling  of her feet to the point which she was unable to see her ankles.  Patient admits that she had ran out of a lot of her medications, but states that she is not quite sure which  ones.  She states she is followed by Dr. Haroldine Laws and has a history of heart failure with leaky valve.  She has had some intermittent left-sided chest pains and wheezing in addition to her shortness of breath.  Patient reports that she fell twice due to feeling lightheaded related to her symptoms, and has been having right side pain.  Upon further questioning patient does admit to recently using cocaine a couple days ago and still intermittently smokes cigarettes from time to time.  Denies having any significant fever, nausea, vomiting, or diarrhea symptoms.   Upon admission into the emergency department patient was seen to be afebrile with blood pressures elevated up to 168/120, respirations 15-26, and all other vital signs maintained.  Patient was placed on BiPAP due to work of breathing, but was never documented to have drop in O2 saturations.  Labs significant for glucose 285, albumin 2.6, AST 74, ALT 75, D-dimer 0.56, and high-sensitivity troponin 46->43.  Chest x-ray noted enlarged heart with no acute findings.  BNP had not resulted.  Patient had been given 125 mg of Solu-Medrol IV, DuoNeb breathing treatment, and Lasix 40 mg IV.     Past Psychiatric History: depression, anxiety, cocaine use  Risk to Self:  no Risk to Others:  no Prior Inpatient Therapy:  no Prior Outpatient Therapy:  no  Past Medical History:  Past Medical History:  Diagnosis Date   Anemia    Anginal pain (HCC)    2 months   Arrhythmia    Asthma    CHF (congestive heart failure) (HCC)    1990s   Chronic headaches    COPD (chronic obstructive pulmonary disease) ([place])    ??   Hypercholesteremia    Hypertension     Past Surgical History:  Procedure Laterality Date   LEFT AND RIGHT HEART CATHETERIZATION WITH CORONARY ANGIOGRAM N/A 06/06/2014   Procedure: LEFT AND RIGHT HEART CATHETERIZATION WITH CORONARY ANGIOGRAM;  Surgeon: Jolaine Artist, MD;  Location: [facility] CATH LAB;  Service: Cardiovascular;  Laterality: N/A;    MULTIPLE EXTRACTIONS WITH ALVEOLOPLASTY Bilateral 07/24/2015   Procedure: MULTIPLE EXTRACTIONS WITH ALVEOLOPLASTY,  BILATERAL TORI ;  Surgeon: Diona Browner, DDS;  Location: [facility];  Service: Oral Surgery;  Laterality: Bilateral;   TUBAL LIGATION  1986   Family History:  Family History  Problem Relation Age of Onset   Hypertension Mother    Allergies Mother    Asthma Mother    Heart disease Mother    Stomach cancer Mother        Deceased, 4   Seizures Mother    Hypertension Father        Deceased   Hypertension Maternal Grandmother    Healthy Brother    Healthy Son    Healthy Daughter    Family Psychiatric  History: none Social History:  Social History   Substance and Sexual Activity  Alcohol Use No   Alcohol/week: 0.0 standard drinks of alcohol     Social History   Substance and Sexual Activity  Drug Use Not Currently   Frequency: 4.0 times per week   Types: "Crack" cocaine   Comment: RELAPSE 12/2016    Social History   Socioeconomic History   Marital status: Legally Separated    Spouse name: Not on file  Number of children: Not on file   Years of education: Not on file   Highest education level: Not on file  Occupational History   Not on file  Tobacco Use   Smoking status: Former    Packs/day: 1.00    Years: 35.00    Total pack years: 35.00    Types: Cigarettes    Quit date: 08/26/2019    Years since quitting: 2.6   Smokeless tobacco: Never  Vaping Use   Vaping Use: Never used  Substance and Sexual Activity   Alcohol use: No    Alcohol/week: 0.0 standard drinks of alcohol   Drug use: Not Currently    Frequency: 4.0 times per week    Types: "Crack" cocaine    Comment: RELAPSE 12/2016   Sexual activity: Not on file  Other Topics Concern   Not on file  Social History Narrative   Lives with son in an apartment on the third floor.  Does not work.  On disability.  Previously worked in [employer].    Education: high school.     Social Determinants of  Health   Financial Resource Strain: Not on file  Food Insecurity: Not on file  Transportation Needs: Not on file  Physical Activity: Not on file  Stress: Not on file  Social Connections: Not on file   Additional Social History:    Allergies:   Allergies  Allergen Reactions   Ketoconazole Rash    Labs:  Results for orders placed or performed during the hospital encounter of 04/06/22 (from the past 48 hour(s))  Glucose, capillary     Status: Abnormal   Collection Time: 04/07/22  4:15 PM  Result Value Ref Range   Glucose-Capillary 132 (H) 70 - 99 mg/dL    Comment: Glucose reference range applies only to samples taken after fasting for at least 8 hours.  Glucose, capillary     Status: Abnormal   Collection Time: 04/07/22  8:31 PM  Result Value Ref Range   Glucose-Capillary 168 (H) 70 - 99 mg/dL    Comment: Glucose reference range applies only to samples taken after fasting for at least 8 hours.  Glucose, capillary     Status: Abnormal   Collection Time: 04/08/22  1:56 AM  Result Value Ref Range   Glucose-Capillary 159 (H) 70 - 99 mg/dL    Comment: Glucose reference range applies only to samples taken after fasting for at least 8 hours.  CBC with Differential/Platelet     Status: Abnormal   Collection Time: 04/08/22  3:41 AM  Result Value Ref Range   WBC 11.0 (H) 4.0 - 10.5 K/uL   RBC 4.49 3.87 - 5.11 MIL/uL   Hemoglobin 13.9 12.0 - 15.0 g/dL   HCT 45.0 36.0 - 46.0 %   MCV 100.2 (H) 80.0 - 100.0 fL   MCH 31.0 26.0 - 34.0 pg   MCHC 30.9 30.0 - 36.0 g/dL   RDW 13.6 11.5 - 15.5 %   Platelets 257 150 - 400 K/uL   nRBC 0.0 0.0 - 0.2 %   Neutrophils Relative % 59 %   Neutro Abs 6.5 1.7 - 7.7 K/uL   Lymphocytes Relative 32 %   Lymphs Abs 3.5 0.7 - 4.0 K/uL   Monocytes Relative 7 %   Monocytes Absolute 0.8 0.1 - 1.0 K/uL   Eosinophils Relative 1 %   Eosinophils Absolute 0.1 0.0 - 0.5 K/uL   Basophils Relative 1 %   Basophils Absolute 0.1 0.0 - 0.1  K/uL   Immature  Granulocytes 0 %   Abs Immature Granulocytes 0.04 0.00 - 0.07 K/uL    Comment: Performed at [facility], [place] [address] [place], [place] [address]  Comprehensive metabolic panel     Status: Abnormal   Collection Time: 04/08/22  3:41 AM  Result Value Ref Range   Sodium 140 135 - 145 mmol/L   Potassium 3.9 3.5 - 5.1 mmol/L   Chloride 108 98 - 111 mmol/L   CO2 21 (L) 22 - 32 mmol/L   Glucose, Bld 100 (H) 70 - 99 mg/dL    Comment: Glucose reference range applies only to samples taken after fasting for at least 8 hours.   BUN 30 (H) 6 - 20 mg/dL   Creatinine, Ser 1.04 (H) 0.44 - 1.00 mg/dL   Calcium 8.5 (L) 8.9 - 10.3 mg/dL   Total Protein 4.4 (L) 6.5 - 8.1 g/dL   Albumin 2.4 (L) 3.5 - 5.0 g/dL   AST 44 (H) 15 - 41 U/L   ALT 64 (H) 0 - 44 U/L   Alkaline Phosphatase 82 38 - 126 U/L   Total Bilirubin 0.6 0.3 - 1.2 mg/dL   GFR, Estimated >60 >60 mL/min    Comment: (NOTE) Calculated using the CKD-EPI Creatinine Equation (2021)    Anion gap 11 5 - 15    Comment: Performed at [facility] [address] [place], [place] [address]  Magnesium     Status: None   Collection Time: 04/08/22  3:41 AM  Result Value Ref Range   Magnesium 2.1 1.7 - 2.4 mg/dL    Comment: Performed at [facility] [address] [place], [place] [address]  Phosphorus     Status: Abnormal   Collection Time: 04/08/22  3:41 AM  Result Value Ref Range   Phosphorus 4.7 (H) 2.5 - 4.6 mg/dL    Comment: Performed at [facility] [address], [place], [place] [address]  Glucose, capillary     Status: None   Collection Time: 04/08/22  4:08 AM  Result Value Ref Range   Glucose-Capillary 88 70 - 99 mg/dL    Comment: Glucose reference range applies only to samples taken after fasting for at least 8 hours.  Glucose, capillary     Status: None   Collection Time: 04/08/22  6:09 AM  Result Value Ref Range   Glucose-Capillary 98 70 - 99 mg/dL    Comment: Glucose reference range applies only to  samples taken after fasting for at least 8 hours.  Glucose, capillary     Status: Abnormal   Collection Time: 04/08/22 11:28 AM  Result Value Ref Range   Glucose-Capillary 184 (H) 70 - 99 mg/dL    Comment: Glucose reference range applies only to samples taken after fasting for at least 8 hours.  Blood gas, venous     Status: Abnormal   Collection Time: 04/08/22 11:30 AM  Result Value Ref Range   pH, Ven 7.43 7.25 - 7.43   pCO2, Ven 41 (L) 44 - 60 mmHg   pO2, Ven 99 (H) 32 - 45 mmHg   Bicarbonate 27.2 20.0 - 28.0 mmol/L   Acid-Base Excess 2.6 (H) 0.0 - 2.0 mmol/L   O2 Saturation 98.5 %   Patient temperature 36.9    Collection site VEIN    Drawn by Waynetta Sandy     Comment: Performed at [facility] [address] [place],  [address]  Rapid urine drug screen (hospital performed)     Status: Abnormal   Collection Time: 04/08/22  2:37 PM  Result Value Ref Range   Opiates NONE DETECTED NONE DETECTED   Cocaine POSITIVE (A) NONE DETECTED   Benzodiazepines NONE DETECTED NONE DETECTED   Amphetamines NONE DETECTED NONE DETECTED   Tetrahydrocannabinol NONE DETECTED NONE DETECTED   Barbiturates NONE DETECTED NONE DETECTED    Comment: (NOTE) DRUG SCREEN FOR MEDICAL PURPOSES ONLY.  IF CONFIRMATION IS NEEDED FOR ANY PURPOSE, NOTIFY LAB WITHIN 5 DAYS.  LOWEST DETECTABLE LIMITS FOR URINE DRUG SCREEN Drug Class                     Cutoff (ng/mL) Amphetamine and metabolites    1000 Barbiturate and metabolites    200 Benzodiazepine                 696 Tricyclics and metabolites     300 Opiates and metabolites        300 Cocaine and metabolites        300 THC                            50 Performed at [facility], [place] [address], [place], [place] [address]   Glucose, capillary     Status: Abnormal   Collection Time: 04/08/22  5:02 PM  Result Value Ref Range   Glucose-Capillary 142 (H) 70 - 99 mg/dL    Comment: Glucose reference range applies only to samples taken  after fasting for at least 8 hours.  Glucose, capillary     Status: Abnormal   Collection Time: 04/08/22  8:39 PM  Result Value Ref Range   Glucose-Capillary 156 (H) 70 - 99 mg/dL    Comment: Glucose reference range applies only to samples taken after fasting for at least 8 hours.  Glucose, capillary     Status: Abnormal   Collection Time: 04/08/22 11:09 PM  Result Value Ref Range   Glucose-Capillary 161 (H) 70 - 99 mg/dL    Comment: Glucose reference range applies only to samples taken after fasting for at least 8 hours.  Hemoglobin A1c     Status: Abnormal   Collection Time: 04/09/22  2:08 AM  Result Value Ref Range   Hgb A1c MFr Bld 6.4 (H) 4.8 - 5.6 %    Comment: (NOTE) Pre diabetes:          5.7%-6.4%  Diabetes:              >6.4%  Glycemic control for   <7.0% adults with diabetes    Mean Plasma Glucose 136.98 mg/dL    Comment: Performed at [facility] [address] [place], [place] [address]  CBC with Differential/Platelet     Status: None   Collection Time: 04/09/22  2:08 AM  Result Value Ref Range   WBC 9.7 4.0 - 10.5 K/uL   RBC 4.55 3.87 - 5.11 MIL/uL   Hemoglobin 13.7 12.0 - 15.0 g/dL   HCT 44.2 36.0 - 46.0 %   MCV 97.1 80.0 - 100.0 fL   MCH 30.1 26.0 - 34.0 pg   MCHC 31.0 30.0 - 36.0 g/dL   RDW 13.3 11.5 - 15.5 %   Platelets 265 150 - 400 K/uL   nRBC 0.0 0.0 - 0.2 %   Neutrophils Relative % 63 %   Neutro Abs 6.2 1.7 - 7.7 K/uL  Lymphocytes Relative 26 %   Lymphs Abs 2.5 0.7 - 4.0 K/uL   Monocytes Relative 8 %   Monocytes Absolute 0.8 0.1 - 1.0 K/uL   Eosinophils Relative 1 %   Eosinophils Absolute 0.1 0.0 - 0.5 K/uL   Basophils Relative 1 %   Basophils Absolute 0.1 0.0 - 0.1 K/uL   Immature Granulocytes 1 %   Abs Immature Granulocytes 0.05 0.00 - 0.07 K/uL    Comment: Performed at [facility] [address] [place], [place] [address]  Comprehensive metabolic panel     Status: Abnormal   Collection Time: 04/09/22  2:08 AM  Result  Value Ref Range   Sodium 139 135 - 145 mmol/L   Potassium 3.8 3.5 - 5.1 mmol/L   Chloride 99 98 - 111 mmol/L   CO2 28 22 - 32 mmol/L   Glucose, Bld 105 (H) 70 - 99 mg/dL    Comment: Glucose reference range applies only to samples taken after fasting for at least 8 hours.   BUN 32 (H) 6 - 20 mg/dL   Creatinine, Ser 1.07 (H) 0.44 - 1.00 mg/dL   Calcium 8.3 (L) 8.9 - 10.3 mg/dL   Total Protein 4.7 (L) 6.5 - 8.1 g/dL   Albumin 2.5 (L) 3.5 - 5.0 g/dL   AST 31 15 - 41 U/L   ALT 59 (H) 0 - 44 U/L   Alkaline Phosphatase 87 38 - 126 U/L   Total Bilirubin 0.7 0.3 - 1.2 mg/dL   GFR, Estimated >60 >60 mL/min    Comment: (NOTE) Calculated using the CKD-EPI Creatinine Equation (2021)    Anion gap 12 5 - 15    Comment: Performed at [facility] [address] [place], [place] [address]  Magnesium     Status: None   Collection Time: 04/09/22  2:08 AM  Result Value Ref Range   Magnesium 2.4 1.7 - 2.4 mg/dL    Comment: Performed at [facility] [address] [place], [place] [address]  Phosphorus     Status: None   Collection Time: 04/09/22  2:08 AM  Result Value Ref Range   Phosphorus 4.1 2.5 - 4.6 mg/dL    Comment: Performed at [facility] [address] [place], [place] [address]  Glucose, capillary     Status: Abnormal   Collection Time: 04/09/22  4:40 AM  Result Value Ref Range   Glucose-Capillary 171 (H) 70 - 99 mg/dL    Comment: Glucose reference range applies only to samples taken after fasting for at least 8 hours.   Comment 1 Notify RN    Comment 2 Document in Chart   Glucose, capillary     Status: None   Collection Time: 04/09/22  8:24 AM  Result Value Ref Range   Glucose-Capillary 97 70 - 99 mg/dL    Comment: Glucose reference range applies only to samples taken after fasting for at least 8 hours.  Glucose, capillary     Status: Abnormal   Collection Time: 04/09/22 11:27 AM  Result Value Ref Range   Glucose-Capillary 154 (H) 70 - 99 mg/dL     Comment: Glucose reference range applies only to samples taken after fasting for at least 8 hours.    Current Facility-Administered Medications  Medication Dose Route Frequency Provider Last Rate Last Admin   acetaminophen (TYLENOL) tablet 650 mg  650 mg Oral Q6H PRN Fuller Plan A, MD   650 mg at 04/07/22 1621  Or   acetaminophen (TYLENOL) suppository 650 mg  650 mg Rectal Q6H PRN Fuller Plan A, MD       albuterol (PROVENTIL) (2.5 MG/3ML) 0.083% nebulizer solution 2.5 mg  2.5 mg Nebulization Q4H PRN Tamala Julian, Rondell A, MD       carvedilol (COREG) tablet 3.125 mg  3.125 mg Oral BID WC Vikki Ports R, PA-C   3.125 mg at 04/09/22 7989   dapagliflozin propanediol (FARXIGA) tablet 10 mg  10 mg Oral Daily Cherlynn Kaiser A, MD   10 mg at 04/08/22 0840   enoxaparin (LOVENOX) injection 40 mg  40 mg Subcutaneous Daily Fuller Plan A, MD   40 mg at 04/09/22 0839   feeding supplement (GLUCERNA SHAKE) (GLUCERNA SHAKE) liquid 237 mL  237 mL Oral TID BM Elodia Florence., MD       fluticasone furoate-vilanterol (BREO ELLIPTA) 200-25 MCG/ACT 1 puff  1 puff Inhalation Daily Elodia Florence., MD       And   umeclidinium bromide (INCRUSE ELLIPTA) 62.5 MCG/ACT 1 puff  1 puff Inhalation Daily Elodia Florence., MD       furosemide (LASIX) tablet 40 mg  40 mg Oral BID Margie Billet, PA-C   40 mg at 04/09/22 2119   hydrALAZINE (APRESOLINE) injection 10 mg  10 mg Intravenous Q4H PRN Fuller Plan A, MD       insulin aspart (novoLOG) injection 0-15 Units  0-15 Units Subcutaneous Q4H Fuller Plan A, MD   3 Units at 04/09/22 1232   LORazepam (ATIVAN) injection 0.5 mg  0.5 mg Intravenous Q6H PRN Fuller Plan A, MD   0.5 mg at 04/08/22 2252   multivitamin with minerals tablet 1 tablet  1 tablet Oral Daily Elodia Florence., MD       naloxone [facility]) injection 0.4 mg  0.4 mg Intravenous PRN Elodia Florence., MD       sodium chloride flush (NS) 0.9 % injection 3 mL  3 mL  Intravenous Q12H Smith, Rondell A, MD   3 mL at 04/08/22 2131   sodium chloride flush (NS) 0.9 % injection 3 mL  3 mL Intravenous Q12H Almyra Deforest, PA        Musculoskeletal: Strength & Muscle Tone: within normal limits Gait & Station:  slow Patient leans:  forward            Psychiatric Specialty Exam:  Presentation  General Appearance: Casual; Appropriate for Environment (laying in bed in NAD)  Eye Contact:Minimal  Speech:Clear and Coherent; Normal Rate  Speech Volume:Normal  Handedness:No data recorded  Mood and Affect  Mood:Depressed  Affect:Appropriate; Congruent; Constricted   Thought Process  Thought Processes:Coherent; Goal Directed; Linear  Descriptions of Associations:Intact  Orientation:Full (Time, Place and Person)  Thought Content:Logical; WDL  History of Schizophrenia/Schizoaffective disorder:No data recorded Duration of Psychotic Symptoms:No data recorded Hallucinations:Hallucinations: None  Ideas of Reference:None  Suicidal Thoughts:Suicidal Thoughts: No  Homicidal Thoughts:Homicidal Thoughts: No   Sensorium  Memory:Immediate Good; Recent Good; Remote Good  Judgment:Fair  Insight:Fair   Executive Functions  Concentration:Fair  Attention Span:Good  [place] of Knowledge:Good  Language:Good   Psychomotor Activity  Psychomotor Activity:Psychomotor Activity: Normal   Assets  Assets:Communication Skills; Desire for Improvement; Housing   Sleep  Sleep:Sleep: Fair   Physical Exam: Physical Exam Constitutional:      Appearance: She is well-developed.  HENT:     Head: Normocephalic and atraumatic.  Pulmonary:     Effort: Pulmonary effort is  normal.  Neurological:     Mental Status: She is alert.    Review of Systems  Psychiatric/Behavioral:  Positive for depression and substance abuse. Negative for hallucinations and suicidal ideas.    Blood pressure (!) 132/96, pulse 79, temperature 98.5 F (36.9  C), temperature source Oral, resp. rate 20, height 5' (1.524 m), weight 71.4 kg, SpO2 99 %. Body mass index is 30.76 kg/m.  Treatment Plan Summary: Stacey Lin is a 56 y.o. female patient admitted with CHF exacerbation after presenting to the ED on 6/14 for SOB and chest pain. UDS +cocaine. Psychiatry was consulted for the above reason on 6/16 after a rapid response was called. Patient reports previously being diagnosed with anxiety and depression and reports numerous recent stressors--most recently, she reports that her nephew passed away yesterday after sustaining a gsw. Patient does not recall the name of her antidepressant; per med rec she was most recently on lexapro- patient is agreeable to restart this. Patient would also benefit from chaplain consult given recent stressors. Patient minimizes substance use and is not interested in substance use treatment at this time.   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 Florene Glen via epic secure chat.   -Psychiatry to follow up on Monday 6/19 if still in the hospital  Disposition: No evidence of imminent risk to self or others at present.   Patient does not meet criteria for psychiatric inpatient admission. Supportive therapy provided about ongoing stressors.  Ival Bible, MD 04/09/2022 3:22 PM

## 2022-04-09 NOTE — Progress Notes (Signed)
Signed and held order for heart catherization remain held due to patient having several questions regarding Monday's procedure.

## 2022-04-09 NOTE — Progress Notes (Signed)
PROGRESS NOTE    Stacey Lin  NGE:[id number] DOB: 1966/03/17 DOA: 04/06/2022 PCP: Grayce Sessions, NP  Chief Complaint  Patient presents with   Shortness of Breath    Brief Narrative:  Stacey Lin is Stacey Lin 56 y.o. female with medical history significant of hypertension, hyperlipidemia, congestive heart failure, diabetes mellitus type 2, COPD, and prior tobacco abuse who presents with complaints of shortness of breath progressively worsening over the last 2 weeks.  She complains of having progressively worsening swelling of her feet to the point which she was unable to see her ankles.  Patient admits that she had ran out of Fallan Mccarey lot of her medications, but states that she is not quite sure which ones.  She states she is followed by Dr. Gala Romney and has Heli Dino history of heart failure with leaky valve.  She has had some intermittent left-sided chest pains and wheezing in addition to her shortness of breath.  Patient reports that she fell twice due to feeling lightheaded related to her symptoms, and has been having right side pain.  Upon further questioning patient does admit to recently using cocaine Xaviera Flaten couple days ago and still intermittently smokes cigarettes from time to time.  Denies having any significant fever, nausea, vomiting, or diarrhea symptoms.   Upon admission into the emergency department patient was seen to be afebrile with blood pressures elevated up to 168/120, respirations 15-26, and all other vital signs maintained.  Patient was placed on BiPAP due to work of breathing, but was never documented to have drop in O2 saturations.  Labs significant for glucose 285, albumin 2.6, AST 74, ALT 75, D-dimer 0.56, and high-sensitivity troponin 46->43.  Chest x-ray noted enlarged heart with no acute findings.  BNP had not resulted.  Patient had been given 125 mg of Solu-Medrol IV, DuoNeb breathing treatment, and Lasix 40 mg IV.      Assessment & Plan:   Principal Problem:   Acute exacerbation  of CHF (congestive heart failure) (HCC) Active Problems:   Acute respiratory failure with hypoxia (HCC)   Elevated troponin   COPD without exacerbation (HCC)   Acute metabolic encephalopathy   Essential hypertension, benign   Controlled type 2 diabetes mellitus with hyperglycemia (HCC)   Transaminitis   Hypoalbuminemia   Polysubstance abuse (HCC)   Headache   Elevated LFTs   Obesity (BMI 30-39.9)   Depressed mood   Assessment and Plan: * Acute exacerbation of CHF (congestive heart failure) (HCC) Patient presents with complaints of progressively worsening lower extremity swelling and shortness of breath over the last 2 weeks.   CXR 6/15 without acute findings Initially required bipap Improved edema today BNP was elevated at 1508.2 echocardiogram from 2019 noted EF of 55-65% with grade 1 diastolic dysfunction Repeat echo with EF 30-35%, grade 1 diastolic dysfunction, moderate to severe MVR, moderate to severe TVR, moderate to severe AVR (see report) Diuresis per cardiology Coreg, farxiga Appreciate cardiology recs - planning to resume GDMT as tolerated.  Planning for [facility] Monday   Acute respiratory failure with hypoxia (HCC) Currently on RA CXR without acute findings, though exam at presentation concerning for HF Mild wheezing at times - improved, continue breo/incruse, give albuterol and follow - I don't think she needs steroids at this time Will repeat CXR today -> this wasn't done, she's improved on RA, will hold off Low threshold for additional w/u (CT PE protocol) - she's now improved  Elevated troponin CP seems msk vs pleuritic/related to cough Elevated, but flat Appreciate cards  recs, low suspicion for acs   Acute metabolic encephalopathy Called to bedside due to encephalopathy on 6/16 She was coming around by the time I got there, but was described as somnolent in cards notes VBG without hypercarbia tox screen remain positive only for cocaine Unclear cause of  her encephalopathy, she got ativan late night prior? Possible contributor?  I don't see other possibly contributing meds.   Delirium precautions Narcan prn  Follow closely    COPD without exacerbation (HCC) Continue home meds Improved wheezing S/p solumedrol in ED, I don't think she needs steroids, but will continue nebs for now  Essential hypertension, benign Coreg Follow for adjustments  Controlled type 2 diabetes mellitus with hyperglycemia (HCC) a1c 10/2021 6.2 Repeat SSI Started on farxiga  Transaminitis Related to HF? Cocaine use? Negative acute hepatitis panel trend  Hypoalbuminemia Follow RD  Polysubstance abuse (HCC) utox positive for cocaine, encourage cessation  Headache resolved  Depressed mood Will ask psych to see, sounds like lots of social stressors She wasn't initially able to say whether she was having any SI Addendum: evaluated later in afternoon (6/16), this time, she clearly states no desire to hurt herself or end her life.  She's still open to psychiatry seeing her.  Obesity (BMI 30-39.9) noted     DVT prophylaxis: lovenox Code Status: full Family Communication: none Disposition:   Status is: Inpatient Remains inpatient appropriate because: need for further management by cards, encephalopathy   Consultants:  Cards psych  Procedures:  IMPRESSIONS     1. Significant change in LVEF was 55-60% in 2019. Left ventricular  ejection fraction, by estimation, is 30 to 35%. The left ventricle has  moderately decreased function. The left ventricle demonstrates global  hypokinesis. The left ventricular internal  cavity size was moderately dilated. Left ventricular diastolic parameters  are consistent with Grade I diastolic dysfunction (impaired relaxation).   2. Right ventricular systolic function is normal. The right ventricular  size is normal. There is mildly elevated pulmonary artery systolic  pressure.   3. Left atrial size was  severely dilated.   4. Right atrial size was severely dilated.   5. The mitral valve is normal in structure. Moderate to severe mitral  valve regurgitation. Mild mitral stenosis. The mean mitral valve gradient  is 8.5 mmHg.   6. Tricuspid valve regurgitation is moderate to severe.   7. The aortic valve is normal in structure. Aortic valve regurgitation is  moderate to severe. No aortic stenosis is present.   8. The inferior vena cava is normal in size with greater than 50%  respiratory variability, suggesting right atrial pressure of 3 mmHg.   Comparison(s): Delmy Holdren prior study was performed on 04/10/2018. The left  ventricular function is significantly worse. Aortic regugitation, mitral  regurgitation and tricuspid regurgitation have worsened.   Antimicrobials:  Anti-infectives (From admission, onward)    None       Subjective: No new complaints Asking about cards plan for cath  Objective: Vitals:   04/08/22 2338 04/09/22 0503 04/09/22 0505 04/09/22 0751  BP: (!) 156/136 111/86  (!) 132/96  Pulse: 87 79    Resp: 20 20    Temp: 97.8 F (36.6 C) 98.5 F (36.9 C)    TempSrc: Oral Oral    SpO2: 95% 99%    Weight:   71.4 kg   Height:        Intake/Output Summary (Last 24 hours) at 04/09/2022 1202 Last data filed at 04/09/2022 0504 Gross per 24 hour  Intake 960 ml  Output 2901 ml  Net -1941 ml   Filed Weights   04/07/22 2300 04/08/22 0416 04/09/22 0505  Weight: 74 kg 74 kg 71.4 kg    Examination:  General: No acute distress. Cardiovascular: RRR Lungs: unlabored Abdomen: Soft, nontender, nondistended Neurological: Alert and oriented 3. Moves all extremities 4 with equal strength. Cranial nerves II through XII grossly intact. Extremities: No clubbing or cyanosis. No edema.  Data Reviewed: I have personally reviewed following labs and imaging studies  CBC: Recent Labs  Lab 04/06/22 0740 04/06/22 0820 04/07/22 0420 04/08/22 0341 04/09/22 0208  WBC 10.0  --   11.6* 11.0* 9.7  NEUTROABS  --   --   --  6.5 6.2  HGB 13.8 14.6  15.0 13.4 13.9 13.7  HCT 44.2 43.0  44.0 41.2 45.0 44.2  MCV 99.5  --  95.6 100.2* 97.1  PLT 281  --  260 257 265    Basic Metabolic Panel: Recent Labs  Lab 04/06/22 0740 04/06/22 0820 04/07/22 0420 04/08/22 0341 04/09/22 0208  NA 137 139  140 142 140 139  K 4.2 4.1  4.1 3.7 3.9 3.8  CL 107 108 104 108 99  CO2 19*  --  29 21* 28  GLUCOSE 285* 285* 125* 100* 105*  BUN 21* 23* 26* 30* 32*  CREATININE 1.04* 0.90 1.21* 1.04* 1.07*  CALCIUM 8.7*  --  8.8* 8.5* 8.3*  MG  --  1.9  --  2.1 2.4  PHOS  --   --   --  4.7* 4.1    GFR: Estimated Creatinine Clearance: 50.6 mL/min (Jerriah Ines) (by C-G formula based on SCr of 1.07 mg/dL (H)).  Liver Function Tests: Recent Labs  Lab 04/06/22 0820 04/07/22 0420 04/08/22 0341 04/09/22 0208  AST 74* 32 44* 31  ALT 75* 61* 64* 59*  ALKPHOS 89 85 82 87  BILITOT 0.7 0.7 0.6 0.7  PROT 4.7* 4.5* 4.4* 4.7*  ALBUMIN 2.6* 2.4* 2.4* 2.5*    CBG: Recent Labs  Lab 04/08/22 2039 04/08/22 2309 04/09/22 0440 04/09/22 0824 04/09/22 1127  GLUCAP 156* 161* 171* 97 154*     Recent Results (from the past 240 hour(s))  Resp Panel by RT-PCR (Flu Darla Mcdonald&B, Covid) Anterior Nasal Swab     Status: None   Collection Time: 04/06/22  5:17 PM   Specimen: Anterior Nasal Swab  Result Value Ref Range Status   SARS Coronavirus 2 by RT PCR NEGATIVE NEGATIVE Final    Comment: (NOTE) SARS-CoV-2 target nucleic acids are NOT DETECTED.  The SARS-CoV-2 RNA is generally detectable in upper respiratory specimens during the acute phase of infection. The lowest concentration of SARS-CoV-2 viral copies this assay can detect is 138 copies/mL. Arva Slaugh negative result does not preclude SARS-Cov-2 infection and should not be used as the sole basis for treatment or other patient management decisions. Austin Pongratz negative result may occur with  improper specimen collection/handling, submission of specimen other than  nasopharyngeal swab, presence of viral mutation(s) within the areas targeted by this assay, and inadequate number of viral copies(<138 copies/mL). Bentlie Catanzaro negative result must be combined with clinical observations, patient history, and epidemiological information. The expected result is Negative.  Fact Sheet for Patients:  BloggerCourse.com  Fact Sheet for Healthcare Providers:  SeriousBroker.it  This test is no t yet approved or cleared by the [place] FDA and  has been authorized for detection and/or diagnosis of SARS-CoV-2 by FDA under an Emergency Use Authorization (EUA). This EUA will remain  in effect (meaning this test can be used) for the duration of the COVID-19 declaration under Section 564(b)(1) of the Act, 21 U.S.C.section 360bbb-3(b)(1), unless the authorization is terminated  or revoked sooner.       Influenza Aashka Salomone by PCR NEGATIVE NEGATIVE Final   Influenza B by PCR NEGATIVE NEGATIVE Final    Comment: (NOTE) The Xpert Xpress SARS-CoV-2/FLU/RSV plus assay is intended as an aid in the diagnosis of influenza from Nasopharyngeal swab specimens and should not be used as Trula Frede sole basis for treatment. Nasal washings and aspirates are unacceptable for Xpert Xpress SARS-CoV-2/FLU/RSV testing.  Fact Sheet for Patients: BloggerCourse.com  Fact Sheet for Healthcare Providers: SeriousBroker.it  This test is not yet approved or cleared by the [place] FDA and has been authorized for detection and/or diagnosis of SARS-CoV-2 by FDA under an Emergency Use Authorization (EUA). This EUA will remain in effect (meaning this test can be used) for the duration of the COVID-19 declaration under Section 564(b)(1) of the Act, 21 U.S.C. section 360bbb-3(b)(1), unless the authorization is terminated or revoked.  Performed at [facility], [address] [place], [place] [address]           Radiology Studies: No results found.      Scheduled Meds:  carvedilol  3.125 mg Oral BID WC   dapagliflozin propanediol  10 mg Oral Daily   enoxaparin (LOVENOX) injection  40 mg Subcutaneous Daily   fluticasone furoate-vilanterol  1 puff Inhalation Daily   And   umeclidinium bromide  1 puff Inhalation Daily   furosemide  40 mg Oral BID   insulin aspart  0-15 Units Subcutaneous Q4H   sodium chloride flush  3 mL Intravenous Q12H   Continuous Infusions:   LOS: 3 days    Time spent: over 30 min    Lacretia Nicks, MD [facility]   To contact the attending provider between 7A-7P or the covering provider during after hours 7P-7A, please log into the web site [email or web address] and access using universal [place] password for that web site. If you do not have the password, please call the hospital operator.  04/09/2022, 12:02 PM

## 2022-04-09 NOTE — Evaluation (Signed)
Physical Therapy Evaluation Patient Details Name: Stacey Lin MRN: [id number] DOB: 1965/12/16 Today's Date: 04/09/2022  History of Present Illness  Pt adm 6/14 with acute on chronic heart failure. PMH - chf, DM, HTN, copd, cocaine use.  Clinical Impression  Pt presents to PT with slightly unsteady gait due to illness and inactivity. Expect pt will make good progress back to baseline with mobility. Will follow acutely but doubt pt will need PT after DC.         Recommendations for follow up therapy are one component of a multi-disciplinary discharge planning process, led by the attending physician.  Recommendations may be updated based on patient status, additional functional criteria and insurance authorization.  Follow Up Recommendations No PT follow up    Assistance Recommended at Discharge Intermittent Supervision/Assistance  Patient can return home with the following  Assistance with cooking/housework    Equipment Recommendations None recommended by PT  Recommendations for Other Services       Functional Status Assessment Patient has had a recent decline in their functional status and demonstrates the ability to make significant improvements in function in a reasonable and predictable amount of time.     Precautions / Restrictions Precautions Precautions: Fall Restrictions Weight Bearing Restrictions: No      Mobility  Bed Mobility               General bed mobility comments: Pt up on shower seat    Transfers Overall transfer level: Modified independent Equipment used: None                    Ambulation/Gait Ambulation/Gait assistance: Min guard Gait Distance (Feet): 60 Feet Assistive device: None, Rolling walker (2 wheels) Gait Pattern/deviations: Step-through pattern, Decreased stride length, Trunk flexed Gait velocity: decr Gait velocity interpretation: <1.31 ft/sec, indicative of household ambulator   General Gait Details: Assist for  safety with pt unsteady but no overt loss of balance. Pt fatigues quickly and leans forward to prop forearms on walker to rest.  Stairs            Wheelchair Mobility    Modified Rankin (Stroke Patients Only)       Balance Overall balance assessment: Needs assistance Sitting-balance support: No upper extremity supported, Feet supported Sitting balance-Leahy Scale: Normal     Standing balance support: No upper extremity supported, During functional activity Standing balance-Leahy Scale: Good                               Pertinent Vitals/Pain Pain Assessment Pain Assessment: No/denies pain    Home Living Family/patient expects to be discharged to:: Private residence Living Arrangements: Alone Available Help at Discharge: Personal care attendant (Mon - Fri) Type of Home: House Home Access: Stairs to enter Entrance Stairs-Rails: Right Entrance Stairs-Number of Steps: 2-3   Home Layout: One level Home Equipment: Agricultural consultant (2 wheels);Cane - single point      Prior Function Prior Level of Function : Needs assist       Physical Assist : ADLs (physical)     Mobility Comments: Modified indpendent with cane or walker ADLs Comments: Aide for ADLS. Brother does grocery shopping     Hand Dominance        Extremity/Trunk Assessment   Upper Extremity Assessment Upper Extremity Assessment: Overall WFL for tasks assessed    Lower Extremity Assessment Lower Extremity Assessment: Generalized weakness       Communication  Communication: No difficulties  Cognition Arousal/Alertness: Awake/alert Behavior During Therapy: WFL for tasks assessed/performed Overall Cognitive Status: Within Functional Limits for tasks assessed                                          General Comments General comments (skin integrity, edema, etc.): SpO2 100% on RA with amb. Dyspnea 3/4.    Exercises     Assessment/Plan    PT Assessment  Patient needs continued PT services  PT Problem List Decreased strength;Decreased activity tolerance;Decreased balance;Decreased mobility       PT Treatment Interventions DME instruction;Gait training;Stair training;Functional mobility training;Therapeutic activities;Therapeutic exercise;Balance training;Patient/family education    PT Goals (Current goals can be found in the Care Plan section)  Acute Rehab PT Goals Patient Stated Goal: go home PT Goal Formulation: With patient Time For Goal Achievement: 04/16/22 Potential to Achieve Goals: Good    Frequency Min 2X/week     Co-evaluation               AM-PAC PT "6 Clicks" Mobility  Outcome Measure Help needed turning from your back to your side while in a flat bed without using bedrails?: None Help needed moving from lying on your back to sitting on the side of a flat bed without using bedrails?: None Help needed moving to and from a bed to a chair (including a wheelchair)?: A Little Help needed standing up from a chair using your arms (e.g., wheelchair or bedside chair)?: None Help needed to walk in hospital room?: A Little Help needed climbing 3-5 steps with a railing? : A Little 6 Click Score: 21    End of Session   Activity Tolerance: Patient limited by fatigue Patient left: in bed;with call bell/phone within reach;with bed alarm set   PT Visit Diagnosis: Other abnormalities of gait and mobility (R26.89);Muscle weakness (generalized) (M62.81)    Time: 1040-1100 PT Time Calculation (min) (ACUTE ONLY): 20 min   Charges:   PT Evaluation $PT Eval Moderate Complexity: 1 Mod          [facility] PT Acute Rehabilitation Services Office [phone]   Angelina Ok [facility] 04/09/2022, 11:18 AM

## 2022-04-09 NOTE — Progress Notes (Signed)
Progress Note  Patient Name: Stacey Lin Date of Encounter: 04/09/2022  [facility] Cardiologist: None   Subjective   Remains naked in the bathroom again today.  O2 sats 99% on RA.  SOB improved. Very tearful as she just heard that her nephew died this am.    Inpatient Medications    Scheduled Meds:  carvedilol  3.125 mg Oral BID WC   dapagliflozin propanediol  10 mg Oral Daily   enoxaparin (LOVENOX) injection  40 mg Subcutaneous Daily   fluticasone furoate-vilanterol  1 puff Inhalation Daily   And   umeclidinium bromide  1 puff Inhalation Daily   furosemide  40 mg Oral BID   insulin aspart  0-15 Units Subcutaneous Q4H   sodium chloride flush  3 mL Intravenous Q12H   Continuous Infusions:  PRN Meds: acetaminophen **OR** acetaminophen, albuterol, hydrALAZINE, LORazepam, naLOXone (NARCAN)  injection   Vital Signs    Vitals:   04/08/22 2335 04/08/22 2338 04/09/22 0503 04/09/22 0505  BP: (!) 156/136 (!) 156/136 111/86   Pulse:  87 79   Resp:  20 20   Temp:  97.8 F (36.6 C) 98.5 F (36.9 C)   TempSrc:  Oral Oral   SpO2:  95% 99%   Weight:    71.4 kg  Height:        Intake/Output Summary (Last 24 hours) at 04/09/2022 1024 Last data filed at 04/09/2022 0504 Gross per 24 hour  Intake 960 ml  Output 3201 ml  Net -2241 ml       04/09/2022    5:05 AM 04/08/2022    4:16 AM 04/07/2022   11:00 PM  Last 3 Weights  Weight (lbs) 157 lb 8 oz 163 lb 3.2 oz 163 lb 3.2 oz  Weight (kg) 71.442 kg 74.027 kg 74.027 kg      Telemetry    Normal sinus rhythm- Personally Reviewed  ECG    No new EKG to review- Personally Reviewed  Physical Exam   GEN: Well nourished, well developed in no acute distress HEENT: Normal NECK: No JVD; No carotid bruits LYMPHATICS: No lymphadenopathy CARDIAC:RRR, no murmurs, rubs, gallops RESPIRATORY:  Clear to auscultation without rales, wheezing or rhonchi  ABDOMEN: Soft, non-tender, non-distended MUSCULOSKELETAL:  No edema; No  deformity  SKIN: Warm and dry NEUROLOGIC:  Alert and oriented x 3 PSYCHIATRIC:  Normal affect   Labs    High Sensitivity Troponin:   Recent Labs  Lab 04/06/22 0740 04/06/22 0945  TROPONINIHS 46* 43*      Chemistry Recent Labs  Lab 04/06/22 0820 04/07/22 0420 04/08/22 0341 04/09/22 0208  NA 139  140 142 140 139  K 4.1  4.1 3.7 3.9 3.8  CL 108 104 108 99  CO2  --  29 21* 28  GLUCOSE 285* 125* 100* 105*  BUN 23* 26* 30* 32*  CREATININE 0.90 1.21* 1.04* 1.07*  CALCIUM  --  8.8* 8.5* 8.3*  MG 1.9  --  2.1 2.4  PROT 4.7* 4.5* 4.4* 4.7*  ALBUMIN 2.6* 2.4* 2.4* 2.5*  AST 74* 32 44* 31  ALT 75* 61* 64* 59*  ALKPHOS 89 85 82 87  BILITOT 0.7 0.7 0.6 0.7  GFRNONAA  --  52* >60 >60  ANIONGAP  --  '9 11 12     '$ Lipids No results for input(s): "CHOL", "TRIG", "HDL", "LABVLDL", "LDLCALC", "CHOLHDL" in the last 168 hours.  Hematology Recent Labs  Lab 04/07/22 0420 04/08/22 0341 04/09/22 0208  WBC 11.6* 11.0* 9.7  RBC 4.31 4.49 4.55  HGB 13.4 13.9 13.7  HCT 41.2 45.0 44.2  MCV 95.6 100.2* 97.1  MCH 31.1 31.0 30.1  MCHC 32.5 30.9 31.0  RDW 13.4 13.6 13.3  PLT 260 257 265    Thyroid  Recent Labs  Lab 04/06/22 1745  TSH 0.459     BNP Recent Labs  Lab 04/06/22 0740  BNP 1,508.2*     DDimer  Recent Labs  Lab 04/06/22 0820  DDIMER 0.56*      Radiology    No results found.  Cardiac Studies   Echocardiogram 04/07/22 1. Significant change in LVEF was 55-60% in 2019. Left ventricular  ejection fraction, by estimation, is 30 to 35%. The left ventricle has  moderately decreased function. The left ventricle demonstrates global  hypokinesis. The left ventricular internal  cavity size was moderately dilated. Left ventricular diastolic parameters  are consistent with Grade I diastolic dysfunction (impaired relaxation).   2. Right ventricular systolic function is normal. The right ventricular  size is normal. There is mildly elevated pulmonary artery systolic   pressure.   3. Left atrial size was severely dilated.   4. Right atrial size was severely dilated.   5. The mitral valve is normal in structure. Moderate to severe mitral  valve regurgitation. Mild mitral stenosis. The mean mitral valve gradient  is 8.5 mmHg.   6. Tricuspid valve regurgitation is moderate to severe.   7. The aortic valve is normal in structure. Aortic valve regurgitation is  moderate to severe. No aortic stenosis is present.   8. The inferior vena cava is normal in size with greater than 50%  respiratory variability, suggesting right atrial pressure of 3 mmHg.   Comparison(s): A prior study was performed on 04/10/2018. The left  ventricular function is significantly worse. Aortic regugitation, mitral  regurgitation and tricuspid regurgitation have worsened.   Patient Profile     56 y.o. female with a history of heart failure, HLD, HTN, mitral valve regurgitation, polysubstance abuse, asthma, anemia who presented with SOB, was seen 6/14 for evaluation of decompensated heart failure  Assessment & Plan    Altered Level of Consciousness  -Yesterday patient was very difficult to arouse. She was alert and oriented, but fell asleep quickly between questions.  Apparently has been taking her down off for the past 24 hours -Rapid response was called yesterday -Urine drug screen was positive for cocaine -Psych consult has been called  Acute decompensated systolic heart failure  - Patient previously had a reduced EF of 20-25% in 03/2016. Echo in 03/2017 showed that EF had improved to 60-65% with grade I diastolic dysfunction  - Echo this admission showed EF 52-77%, grade I diastolic dysfunction, normal RV systolic function, severe dilation of LA and RA, moderate to severe MVR, moderate to severe  TVR, moderate to severe AI  - Suspect reduced EF is in part due to substance use and noncompliance with home medications. However patient has not recently had ischemic evaluation  -  Patient has been educated on the importance of medication compliance, stopping substance abuse.  - Has been given 2 doses lasix 40 mg IV so far - Creatinine bumped from 0.90>>1.21 after 2 doses IV lasix and the addition of SGLT2i.  - Lasix has been held and serum creatinine improved to 1.07 today - BP did fall with diuresis but now normalized - now on Lasix '40mg'$  PO BID - She put out 3.5 L yesterday and is net -3.69 L since admission - Continue  farxiga 10 mg daily and carvedilol 3.125 mg twice daily - Restart spironolactone 12.5 mg daily and uptitrate as BP allows - Hopefully blood pressure will remain stable enough that we can add losartan at some point - I think she would benefit from a right and left heart cath >> we will make n.p.o. after midnight Sunday and plan cath on Monday - Patient has been seen by [facility] in the past, consider consulting them this admission. However, as patient is noncompliant and has been using substances, not sure how much they could offer  -Shared Decision Making/Informed Consent The risks [stroke (1 in 1000), death (1 in 1000), kidney failure [usually temporary] (1 in 500), bleeding (1 in 200), allergic reaction [possibly serious] (1 in 200)], benefits (diagnostic support and management of coronary artery disease) and alternatives of a cardiac catheterization were discussed in detail with Ms. Gentzler and she is willing to proceed.    History of Rheumatic Valvular Heart Disease  - Most recent echocardiogram in 03/2018 showed mild-moderate aortic valve regurgitation, mild mitral valve regurgitation, moderate tricuspid valve regurgitation  - Echo this admission with severe dilation of LA and RA, moderate to severe MVR, moderate to severe AI    Polysubstance Abuse - Continues to use cocaine and smoke cigarettes  - Patient has been counseled on the importance of stopping these substances, and their negative effects on heart health    Elevated trop  -  hsTn 46>>43 - Minimal elevation with flat trend-- not consistent with Acs, suspect secondary to demand in the setting of heart failure exacerbation  - She does have multiple cardiac risk factors for CAD and therefore I do think with the drop in EF that ischemic work-up with cardiac cath would be appropriate  Otherwise Per primary  - Type 2 DM  - Transaminitis  - Hypoalbuminemia  - COPD  I have spent a total of 35 minutes with patient reviewing 2D echo , telemetry, EKGs, labs and examining patient as well as establishing an assessment and plan that was discussed with the patient.  > 50% of time was spent in direct patient care.     For questions or updates, please contact [place] Please consult [email or web address] for contact info under    Signed, Fransico Him, MD  04/09/2022, 10:24 AM

## 2022-04-10 DIAGNOSIS — I5043 Acute on chronic combined systolic (congestive) and diastolic (congestive) heart failure: Secondary | ICD-10-CM | POA: Diagnosis not present

## 2022-04-10 DIAGNOSIS — R7989 Other specified abnormal findings of blood chemistry: Secondary | ICD-10-CM

## 2022-04-10 DIAGNOSIS — I10 Essential (primary) hypertension: Secondary | ICD-10-CM | POA: Diagnosis not present

## 2022-04-10 DIAGNOSIS — I351 Nonrheumatic aortic (valve) insufficiency: Secondary | ICD-10-CM

## 2022-04-10 DIAGNOSIS — R778 Other specified abnormalities of plasma proteins: Secondary | ICD-10-CM | POA: Diagnosis not present

## 2022-04-10 DIAGNOSIS — I340 Nonrheumatic mitral (valve) insufficiency: Secondary | ICD-10-CM

## 2022-04-10 LAB — COMPREHENSIVE METABOLIC PANEL
ALT: 48 U/L — ABNORMAL HIGH (ref 0–44)
AST: 28 U/L (ref 15–41)
Albumin: 2.5 g/dL — ABNORMAL LOW (ref 3.5–5.0)
Alkaline Phosphatase: 84 U/L (ref 38–126)
Anion gap: 11 (ref 5–15)
BUN: 34 mg/dL — ABNORMAL HIGH (ref 6–20)
CO2: 27 mmol/L (ref 22–32)
Calcium: 8.4 mg/dL — ABNORMAL LOW (ref 8.9–10.3)
Chloride: 98 mmol/L (ref 98–111)
Creatinine, Ser: 1.2 mg/dL — ABNORMAL HIGH (ref 0.44–1.00)
GFR, Estimated: 52 mL/min — ABNORMAL LOW (ref >60)
Glucose, Bld: 236 mg/dL — ABNORMAL HIGH (ref 70–99)
Potassium: 4.3 mmol/L (ref 3.5–5.1)
Sodium: 136 mmol/L (ref 135–145)
Total Bilirubin: 1.1 mg/dL (ref 0.3–1.2)
Total Protein: 4.9 g/dL — ABNORMAL LOW (ref 6.5–8.1)

## 2022-04-10 LAB — CBC WITH DIFFERENTIAL/PLATELET
Abs Immature Granulocytes: 0.06 10*3/uL (ref 0.00–0.07)
Basophils Absolute: 0.1 10*3/uL (ref 0.0–0.1)
Basophils Relative: 1 %
Eosinophils Absolute: 0.1 10*3/uL (ref 0.0–0.5)
Eosinophils Relative: 1 %
HCT: 45.1 % (ref 36.0–46.0)
Hemoglobin: 14.5 g/dL (ref 12.0–15.0)
Immature Granulocytes: 1 %
Lymphocytes Relative: 23 %
Lymphs Abs: 2.1 10*3/uL (ref 0.7–4.0)
MCH: 31.1 pg (ref 26.0–34.0)
MCHC: 32.2 g/dL (ref 30.0–36.0)
MCV: 96.8 fL (ref 80.0–100.0)
Monocytes Absolute: 0.8 10*3/uL (ref 0.1–1.0)
Monocytes Relative: 9 %
Neutro Abs: 6.1 10*3/uL (ref 1.7–7.7)
Neutrophils Relative %: 65 %
Platelets: 271 10*3/uL (ref 150–400)
RBC: 4.66 MIL/uL (ref 3.87–5.11)
RDW: 13.2 % (ref 11.5–15.5)
WBC: 9.2 10*3/uL (ref 4.0–10.5)
nRBC: 0 % (ref 0.0–0.2)

## 2022-04-10 LAB — GLUCOSE, CAPILLARY
Glucose-Capillary: 111 mg/dL — ABNORMAL HIGH (ref 70–99)
Glucose-Capillary: 155 mg/dL — ABNORMAL HIGH (ref 70–99)
Glucose-Capillary: 160 mg/dL — ABNORMAL HIGH (ref 70–99)
Glucose-Capillary: 175 mg/dL — ABNORMAL HIGH (ref 70–99)
Glucose-Capillary: 196 mg/dL — ABNORMAL HIGH (ref 70–99)
Glucose-Capillary: 207 mg/dL — ABNORMAL HIGH (ref 70–99)
Glucose-Capillary: 85 mg/dL (ref 70–99)

## 2022-04-10 LAB — PHOSPHORUS: Phosphorus: 5 mg/dL — ABNORMAL HIGH (ref 2.5–4.6)

## 2022-04-10 LAB — MAGNESIUM: Magnesium: 2.3 mg/dL (ref 1.7–2.4)

## 2022-04-10 MED ORDER — FUROSEMIDE 40 MG PO TABS: 40.0000 mg | ORAL_TABLET | Freq: Every day | ORAL | Status: DC

## 2022-04-10 MED ORDER — SODIUM CHLORIDE 0.9% FLUSH: 3.0000 mL | INTRAVENOUS | Status: DC | PRN

## 2022-04-10 MED ORDER — SODIUM CHLORIDE 0.9 % IV SOLN: 250.0000 mL | INTRAVENOUS | Status: DC | PRN

## 2022-04-10 MED ORDER — INSULIN ASPART 100 UNIT/ML IJ SOLN
0.0000 [IU] | Freq: Every day | INTRAMUSCULAR | Status: DC
  Administered 2022-04-11: 2 [IU] via SUBCUTANEOUS

## 2022-04-10 MED ORDER — ASPIRIN 81 MG PO CHEW
81.0000 mg | CHEWABLE_TABLET | ORAL | Status: AC
  Administered 2022-04-11: 81 mg via ORAL
  Filled 2022-04-10: qty 1

## 2022-04-10 MED ORDER — INSULIN ASPART 100 UNIT/ML IJ SOLN
0.0000 [IU] | Freq: Three times a day (TID) | INTRAMUSCULAR | Status: DC
  Administered 2022-04-10: 3 [IU] via SUBCUTANEOUS
  Administered 2022-04-11 – 2022-04-12 (×2): 2 [IU] via SUBCUTANEOUS
  Administered 2022-04-12: 1 [IU] via SUBCUTANEOUS
  Administered 2022-04-13: 2 [IU] via SUBCUTANEOUS

## 2022-04-10 MED ORDER — SODIUM CHLORIDE 0.9 % IV SOLN: INTRAVENOUS | Status: DC

## 2022-04-10 NOTE — Progress Notes (Addendum)
Progress Note  Patient Name: Stacey Lin Date of Encounter: 04/10/2022  [facility] Cardiologist: None   Subjective   Very sleepy this am.  O2 saturations 95% on room air Inpatient Medications    Scheduled Meds:  carvedilol  3.125 mg Oral BID WC   dapagliflozin propanediol  10 mg Oral Daily   enoxaparin (LOVENOX) injection  40 mg Subcutaneous Daily   escitalopram  20 mg Oral Daily   feeding supplement (GLUCERNA SHAKE)  237 mL Oral TID BM   fluticasone furoate-vilanterol  1 puff Inhalation Daily   And   umeclidinium bromide  1 puff Inhalation Daily   furosemide  40 mg Oral BID   insulin aspart  0-15 Units Subcutaneous Q4H   multivitamin with minerals  1 tablet Oral Daily   sodium chloride flush  3 mL Intravenous Q12H   sodium chloride flush  3 mL Intravenous Q12H   Continuous Infusions:  PRN Meds: acetaminophen **OR** acetaminophen, albuterol, hydrALAZINE, LORazepam, naLOXone (NARCAN)  injection   Vital Signs    Vitals:   04/09/22 2009 04/10/22 0428 04/10/22 0741 04/10/22 1045  BP:  111/80 115/77 114/88  Pulse:  76 79 81  Resp:  '20 19 17  '$ Temp:  97.6 F (36.4 C) (!) 97.5 F (36.4 C) 98 F (36.7 C)  TempSrc:  Oral Oral Oral  SpO2: 100% 96% 96% 95%  Weight:  70.6 kg    Height:        Intake/Output Summary (Last 24 hours) at 04/10/2022 1101 Last data filed at 04/10/2022 1015 Gross per 24 hour  Intake 1200 ml  Output 3450 ml  Net -2250 ml       04/10/2022    4:28 AM 04/09/2022    5:05 AM 04/08/2022    4:16 AM  Last 3 Weights  Weight (lbs) 155 lb 11.2 oz 157 lb 8 oz 163 lb 3.2 oz  Weight (kg) 70.625 kg 71.442 kg 74.027 kg      Telemetry    Normal sinus rhythm personally Reviewed  ECG    No new EKG to review- Personally Reviewed  Physical Exam   GEN: Well nourished, well developed in no acute distress HEENT: Normal NECK: No JVD; No carotid bruits LYMPHATICS: No lymphadenopathy CARDIAC:RRR, no murmurs, rubs, gallops RESPIRATORY:  Clear to  auscultation without rales, wheezing or rhonchi  ABDOMEN: Soft, non-tender, non-distended MUSCULOSKELETAL:  No edema; No deformity  SKIN: Warm and dry NEUROLOGIC:  Alert and oriented x 3 PSYCHIATRIC:  Normal affect   Labs    High Sensitivity Troponin:   Recent Labs  Lab 04/06/22 0740 04/06/22 0945  TROPONINIHS 46* 43*      Chemistry Recent Labs  Lab 04/08/22 0341 04/09/22 0208 04/10/22 0300  NA 140 139 136  K 3.9 3.8 4.3  CL 108 99 98  CO2 21* 28 27  GLUCOSE 100* 105* 236*  BUN 30* 32* 34*  CREATININE 1.04* 1.07* 1.20*  CALCIUM 8.5* 8.3* 8.4*  MG 2.1 2.4 2.3  PROT 4.4* 4.7* 4.9*  ALBUMIN 2.4* 2.5* 2.5*  AST 44* 31 28  ALT 64* 59* 48*  ALKPHOS 82 87 84  BILITOT 0.6 0.7 1.1  GFRNONAA >60 >60 52*  ANIONGAP '11 12 11     '$ Lipids No results for input(s): "CHOL", "TRIG", "HDL", "LABVLDL", "LDLCALC", "CHOLHDL" in the last 168 hours.  Hematology Recent Labs  Lab 04/08/22 0341 04/09/22 0208 04/10/22 0300  WBC 11.0* 9.7 9.2  RBC 4.49 4.55 4.66  HGB 13.9 13.7 14.5  HCT 45.0 44.2 45.1  MCV 100.2* 97.1 96.8  MCH 31.0 30.1 31.1  MCHC 30.9 31.0 32.2  RDW 13.6 13.3 13.2  PLT 257 265 271    Thyroid  Recent Labs  Lab 04/06/22 1745  TSH 0.459     BNP Recent Labs  Lab 04/06/22 0740  BNP 1,508.2*     DDimer  Recent Labs  Lab 04/06/22 0820  DDIMER 0.56*      Radiology    No results found.  Cardiac Studies   Echocardiogram 04/07/22 1. Significant change in LVEF was 55-60% in 2019. Left ventricular  ejection fraction, by estimation, is 30 to 35%. The left ventricle has  moderately decreased function. The left ventricle demonstrates global  hypokinesis. The left ventricular internal  cavity size was moderately dilated. Left ventricular diastolic parameters  are consistent with Grade I diastolic dysfunction (impaired relaxation).   2. Right ventricular systolic function is normal. The right ventricular  size is normal. There is mildly elevated  pulmonary artery systolic  pressure.   3. Left atrial size was severely dilated.   4. Right atrial size was severely dilated.   5. The mitral valve is normal in structure. Moderate to severe mitral  valve regurgitation. Mild mitral stenosis. The mean mitral valve gradient  is 8.5 mmHg.   6. Tricuspid valve regurgitation is moderate to severe.   7. The aortic valve is normal in structure. Aortic valve regurgitation is  moderate to severe. No aortic stenosis is present.   8. The inferior vena cava is normal in size with greater than 50%  respiratory variability, suggesting right atrial pressure of 3 mmHg.   Comparison(s): A prior study was performed on 04/10/2018. The left  ventricular function is significantly worse. Aortic regugitation, mitral  regurgitation and tricuspid regurgitation have worsened.   Patient Profile     56 y.o. female with a history of heart failure, HLD, HTN, mitral valve regurgitation, polysubstance abuse, asthma, anemia who presented with SOB, was seen 6/14 for evaluation of decompensated heart failure  Assessment & Plan    Altered Level of Consciousness  -Urine drug screen was positive for cocaine -Psych consult in progress  Acute decompensated systolic heart failure  - Patient previously had a reduced EF of 20-25% in 03/2016. Echo in 03/2017 showed that EF had improved to 60-65% with grade I diastolic dysfunction  - Echo this admission showed EF 16-10%, grade I diastolic dysfunction, normal RV systolic function, severe dilation of LA and RA, moderate to severe MVR, moderate to severe  TVR, moderate to severe AI  - Suspect reduced EF is in part due to substance abuse and noncompliance with home medications. However patient has not recently had ischemic evaluation  - Patient has been educated on the importance of medication compliance, stopping substance abuse.  - Has been given 2 doses lasix 40 mg IV so far - Creatinine bumped from 0.90>>1.21 after 2 doses IV  lasix and the addition of SGLT2i.  - Lasix has been held and serum creatinine slightly increased from 1.07->>1.2 today - BP did fall with diuresis but now normalized - She put out 2.65 L yesterday and is net - 5.7 L since admission - With bump in serum creatinine will decrease Lasix to 40 mg p.o. daily - Continue farxiga 10 mg daily, spironolactone 12.5 mg daily and carvedilol 3.125 mg twice daily - Hopefully blood pressure will remain stable enough that we can add losartan post cath with plan to transition [facility] outpatient - I think  she would benefit from a right and left heart cath >> we will make n.p.o. after midnight Sunday and plan cath on Monday - Patient has been seen by [facility] in the past, consider consulting them this admission. However, as patient is noncompliant and has been using substances, not sure how much they could offer    History of Rheumatic Valvular Heart Disease  - Most recent echocardiogram in 03/2018 showed mild-moderate aortic valve regurgitation, mild mitral valve regurgitation, moderate tricuspid valve regurgitation  - Echo this admission with severe dilation of LA and RA, moderate to severe MVR, moderate to severe AI  - Her valvular heart disease will need to be addressed post cath    Polysubstance Abuse - Continues to use cocaine and smoke cigarettes  - Patient has been counseled on the importance of stopping these substances, and their negative effects on heart health    Elevated trop  - hsTn 46>>43 - Minimal elevation with flat trend-- not consistent with Acs, suspect secondary to demand in the setting of heart failure exacerbation  - She does have multiple cardiac risk factors for CAD and therefore I do think with the drop in EF that ischemic work-up with cardiac cath would be appropriate -Plan for right and left heart catheterization tomorrow  Otherwise Per primary  - Type 2 DM  - Transaminitis  - Hypoalbuminemia  - COPD  I have spent  a total of 35 minutes with patient reviewing 2D echo , telemetry, EKGs, labs and examining patient as well as establishing an assessment and plan that was discussed with the patient.  > 50% of time was spent in direct patient care.     For questions or updates, please contact [place] Please consult [email or web address] for contact info under    Signed, Fransico Him, MD  04/10/2022, 11:01 AM

## 2022-04-10 NOTE — Progress Notes (Signed)
PROGRESS NOTE    Stacey Lin  QIO:[id number] DOB: 09/04/1966 DOA: 04/06/2022 PCP: Grayce Sessions, NP  Chief Complaint  Patient presents with   Shortness of Breath    Brief Narrative:  Stacey Lin is Stacey Lin 56 y.o. female with medical history significant of hypertension, hyperlipidemia, congestive heart failure, diabetes mellitus type 2, COPD, and prior tobacco abuse who presents with complaints of shortness of breath progressively worsening over the last 2 weeks.  She complains of having progressively worsening swelling of her feet to the point which she was unable to see her ankles.  Patient admits that she had ran out of Kamon Fahr lot of her medications, but states that she is not quite sure which ones.  She states she is followed by Dr. Gala Romney and has [place] history of heart failure with leaky valve.  She has had some intermittent left-sided chest pains and wheezing in addition to her shortness of breath.  Patient reports that she fell twice due to feeling lightheaded related to her symptoms, and has been having right side pain.  Upon further questioning patient does admit to recently using cocaine Kenleigh Toback couple days ago and still intermittently smokes cigarettes from time to time.  Denies having any significant fever, nausea, vomiting, or diarrhea symptoms.   Upon admission into the emergency department patient was seen to be afebrile with blood pressures elevated up to 168/120, respirations 15-26, and all other vital signs maintained.  Patient was placed on BiPAP due to work of breathing, but was never documented to have drop in O2 saturations.  Labs significant for glucose 285, albumin 2.6, AST 74, ALT 75, D-dimer 0.56, and high-sensitivity troponin 46->43.  Chest x-ray noted enlarged heart with no acute findings.  BNP had not resulted.  Patient had been given 125 mg of Solu-Medrol IV, DuoNeb breathing treatment, and Lasix 40 mg IV.      Assessment & Plan:   Principal Problem:   Acute exacerbation  of CHF (congestive heart failure) (HCC) Active Problems:   Acute respiratory failure with hypoxia (HCC)   Elevated troponin   COPD without exacerbation (HCC)   Acute metabolic encephalopathy   Essential hypertension, benign   Controlled type 2 diabetes mellitus with hyperglycemia (HCC)   Transaminitis   Hypoalbuminemia   Polysubstance abuse (HCC)   Headache   Elevated LFTs   Obesity (BMI 30-39.9)   Depressed mood   Nonrheumatic aortic valve insufficiency   Assessment and Plan: * Acute exacerbation of CHF (congestive heart failure) (HCC) Patient presents with complaints of progressively worsening lower extremity swelling and shortness of breath over the last 2 weeks.   CXR 6/15 without acute findings Initially required bipap Improved edema today BNP was elevated at 1508.2 echocardiogram from 2019 noted EF of 55-65% with grade 1 diastolic dysfunction Repeat echo with EF 30-35%, grade 1 diastolic dysfunction, moderate to severe MVR, moderate to severe TVR, moderate to severe AVR (see report) Diuresis per cardiology Coreg, farxiga, spironolactone (spiro mentioned in cards note, but not ordered, will follow) Appreciate cardiology recs - planning to resume GDMT as tolerated.  Planning for [facility] [place] Monday   Acute respiratory failure with hypoxia (HCC) Currently on RA CXR without acute findings, though exam at presentation concerning for HF Mild wheezing at times - improved, continue breo/incruse, give albuterol and follow - I don't think she needs steroids at this time Low threshold for additional w/u (CT PE protocol) - she's now improved  Elevated troponin CP seems msk vs pleuritic/related to cough Elevated, but flat  Appreciate cards recs, low suspicion for acs   Acute metabolic encephalopathy Called to bedside due to encephalopathy on 6/16 She was coming around by the time I got there, but was described as somnolent in cards notes VBG without hypercarbia tox screen remain  positive only for cocaine Unclear cause of her encephalopathy, she got ativan late night prior? Possible contributor?  I don't see other possibly contributing meds.   Delirium precautions Narcan prn  Follow closely - seems overall improved    COPD without exacerbation (HCC) Continue home meds Improved wheezing S/p solumedrol in ED, I don't think she needs steroids, but will continue nebs for now  Essential hypertension, benign Coreg Follow for adjustments  Controlled type 2 diabetes mellitus with hyperglycemia (HCC) a1c 10/2021 6.2 Repeat SSI Started on farxiga  Transaminitis Related to HF? Cocaine use? Negative acute hepatitis panel trend  Hypoalbuminemia Follow RD  Polysubstance abuse (HCC) utox positive for cocaine, encourage cessation  Headache resolved  Depressed mood Will ask psych to see, sounds like lots of social stressors She wasn't initially able to say whether she was having any SI Addendum: evaluated later in afternoon (6/16), this time, she clearly states no desire to hurt herself or end her life.  She's still open to psychiatry seeing her. Psych recommending lexapro  Obesity (BMI 30-39.9) noted     DVT prophylaxis: lovenox Code Status: full Family Communication: none Disposition:   Status is: Inpatient Remains inpatient appropriate because: need for further management by cards, encephalopathy   Consultants:  Cards psych  Procedures:  IMPRESSIONS     1. Significant change in LVEF was 55-60% in 2019. Left ventricular  ejection fraction, by estimation, is 30 to 35%. The left ventricle has  moderately decreased function. The left ventricle demonstrates global  hypokinesis. The left ventricular internal  cavity size was moderately dilated. Left ventricular diastolic parameters  are consistent with Grade I diastolic dysfunction (impaired relaxation).   2. Right ventricular systolic function is normal. The right ventricular  size is  normal. There is mildly elevated pulmonary artery systolic  pressure.   3. Left atrial size was severely dilated.   4. Right atrial size was severely dilated.   5. The mitral valve is normal in structure. Moderate to severe mitral  valve regurgitation. Mild mitral stenosis. The mean mitral valve gradient  is 8.5 mmHg.   6. Tricuspid valve regurgitation is moderate to severe.   7. The aortic valve is normal in structure. Aortic valve regurgitation is  moderate to severe. No aortic stenosis is present.   8. The inferior vena cava is normal in size with greater than 50%  respiratory variability, suggesting right atrial pressure of 3 mmHg.   Comparison(s): Nikkie Liming prior study was performed on 04/10/2018. The left  ventricular function is significantly worse. Aortic regugitation, mitral  regurgitation and tricuspid regurgitation have worsened.   Antimicrobials:  Anti-infectives (From admission, onward)    None       Subjective: No new complaints, sleepy   Objective: Vitals:   04/09/22 2009 04/10/22 0428 04/10/22 0741 04/10/22 1045  BP:  111/80 115/77 114/88  Pulse:  76 79 81  Resp:  20 19 17   Temp:  97.6 F (36.4 C) (!) 97.5 F (36.4 C) 98 F (36.7 C)  TempSrc:  Oral Oral Oral  SpO2: 100% 96% 96% 95%  Weight:  70.6 kg    Height:        Intake/Output Summary (Last 24 hours) at 04/10/2022 1346 Last data filed at  04/10/2022 1324 Gross per 24 hour  Intake 1437 ml  Output 4150 ml  Net -2713 ml   Filed Weights   04/08/22 0416 04/09/22 0505 04/10/22 0428  Weight: 74 kg 71.4 kg 70.6 kg    Examination:  General: No acute distress. Lungs: unlabored Neurological: Alert and oriented 3. Moves all extremities 4 with equal strength. Cranial nerves II through XII grossly intact Extremities: No clubbing or cyanosis. No edema.   Data Reviewed: I have personally reviewed following labs and imaging studies  CBC: Recent Labs  Lab 04/06/22 0740 04/06/22 0820 04/07/22 0420  04/08/22 0341 04/09/22 0208 04/10/22 0300  WBC 10.0  --  11.6* 11.0* 9.7 9.2  NEUTROABS  --   --   --  6.5 6.2 6.1  HGB 13.8 14.6  15.0 13.4 13.9 13.7 14.5  HCT 44.2 43.0  44.0 41.2 45.0 44.2 45.1  MCV 99.5  --  95.6 100.2* 97.1 96.8  PLT 281  --  260 257 265 271    Basic Metabolic Panel: Recent Labs  Lab 04/06/22 0740 04/06/22 0820 04/07/22 0420 04/08/22 0341 04/09/22 0208 04/10/22 0300  NA 137 139  140 142 140 139 136  K 4.2 4.1  4.1 3.7 3.9 3.8 4.3  CL 107 108 104 108 99 98  CO2 19*  --  29 21* 28 27  GLUCOSE 285* 285* 125* 100* 105* 236*  BUN 21* 23* 26* 30* 32* 34*  CREATININE 1.04* 0.90 1.21* 1.04* 1.07* 1.20*  CALCIUM 8.7*  --  8.8* 8.5* 8.3* 8.4*  MG  --  1.9  --  2.1 2.4 2.3  PHOS  --   --   --  4.7* 4.1 5.0*    GFR: Estimated Creatinine Clearance: 44.8 mL/min (Harman Ferrin) (by C-G formula based on SCr of 1.2 mg/dL (H)).  Liver Function Tests: Recent Labs  Lab 04/06/22 0820 04/07/22 0420 04/08/22 0341 04/09/22 0208 04/10/22 0300  AST 74* 32 44* 31 28  ALT 75* 61* 64* 59* 48*  ALKPHOS 89 85 82 87 84  BILITOT 0.7 0.7 0.6 0.7 1.1  PROT 4.7* 4.5* 4.4* 4.7* 4.9*  ALBUMIN 2.6* 2.4* 2.4* 2.5* 2.5*    CBG: Recent Labs  Lab 04/09/22 1642 04/10/22 0003 04/10/22 0422 04/10/22 0739 04/10/22 1046  GLUCAP 237* 85 175* 111* 196*     Recent Results (from the past 240 hour(s))  Resp Panel by RT-PCR (Flu Chrissie Dacquisto&B, Covid) Anterior Nasal Swab     Status: None   Collection Time: 04/06/22  5:17 PM   Specimen: Anterior Nasal Swab  Result Value Ref Range Status   SARS Coronavirus 2 by RT PCR NEGATIVE NEGATIVE Final    Comment: (NOTE) SARS-CoV-2 target nucleic acids are NOT DETECTED.  The SARS-CoV-2 RNA is generally detectable in upper respiratory specimens during the acute phase of infection. The lowest concentration of SARS-CoV-2 viral copies this assay can detect is 138 copies/mL. Bevelyn Arriola negative result does not preclude SARS-Cov-2 infection and should not be used as  the sole basis for treatment or other patient management decisions. Yacob Wilkerson negative result may occur with  improper specimen collection/handling, submission of specimen other than nasopharyngeal swab, presence of viral mutation(s) within the areas targeted by this assay, and inadequate number of viral copies(<138 copies/mL). Huyen Perazzo negative result must be combined with clinical observations, patient history, and epidemiological information. The expected result is Negative.  Fact Sheet for Patients:  BloggerCourse.com  Fact Sheet for Healthcare Providers:  SeriousBroker.it  This test is no t yet approved  or cleared by the [place] and  has been authorized for detection and/or diagnosis of SARS-CoV-2 by FDA under an Emergency Use Authorization (EUA). This EUA will remain  in effect (meaning this test can be used) for the duration of the COVID-19 declaration under Section 564(b)(1) of the Act, 21 U.S.C.section 360bbb-3(b)(1), unless the authorization is terminated  or revoked sooner.       Influenza Harsh Trulock by PCR NEGATIVE NEGATIVE Final   Influenza B by PCR NEGATIVE NEGATIVE Final    Comment: (NOTE) The Xpert Xpress SARS-CoV-2/FLU/RSV plus assay is intended as an aid in the diagnosis of influenza from Nasopharyngeal swab specimens and should not be used as Zakary Kimura sole basis for treatment. Nasal washings and aspirates are unacceptable for Xpert Xpress SARS-CoV-2/FLU/RSV testing.  Fact Sheet for Patients: BloggerCourse.com  Fact Sheet for Healthcare Providers: SeriousBroker.it  This test is not yet approved or cleared by the [place] FDA and has been authorized for detection and/or diagnosis of SARS-CoV-2 by FDA under an Emergency Use Authorization (EUA). This EUA will remain in effect (meaning this test can be used) for the duration of the COVID-19 declaration under Section 564(b)(1) of  the Act, 21 U.S.C. section 360bbb-3(b)(1), unless the authorization is terminated or revoked.  Performed at [facility], [address] [place], [place] [address]          Radiology Studies: No results found.      Scheduled Meds:  carvedilol  3.125 mg Oral BID WC   dapagliflozin propanediol  10 mg Oral Daily   enoxaparin (LOVENOX) injection  40 mg Subcutaneous Daily   escitalopram  20 mg Oral Daily   feeding supplement (GLUCERNA SHAKE)  237 mL Oral TID BM   fluticasone furoate-vilanterol  1 puff Inhalation Daily   And   umeclidinium bromide  1 puff Inhalation Daily   [START ON 04/11/2022] furosemide  40 mg Oral Daily   insulin aspart  0-5 Units Subcutaneous QHS   insulin aspart  0-9 Units Subcutaneous TID WC   multivitamin with minerals  1 tablet Oral Daily   sodium chloride flush  3 mL Intravenous Q12H   sodium chloride flush  3 mL Intravenous Q12H   Continuous Infusions:   LOS: 4 days    Time spent: over 30 min    Lacretia Nicks, MD [facility]   To contact the attending provider between 7A-7P or the covering provider during after hours 7P-7A, please log into the web site [email or web address] and access using universal [place] password for that web site. If you do not have the password, please call the hospital operator.  04/10/2022, 1:46 PM

## 2022-04-11 ENCOUNTER — Encounter (HOSPITAL_COMMUNITY)
Admission: EM | Disposition: A | Discharge status: Home / Self Care | Payer: Self-pay | Source: Home / Self Care | Attending: Family Medicine

## 2022-04-11 DIAGNOSIS — I5043 Acute on chronic combined systolic (congestive) and diastolic (congestive) heart failure: Secondary | ICD-10-CM | POA: Diagnosis not present

## 2022-04-11 DIAGNOSIS — R7989 Other specified abnormal findings of blood chemistry: Secondary | ICD-10-CM | POA: Diagnosis not present

## 2022-04-11 DIAGNOSIS — I10 Essential (primary) hypertension: Secondary | ICD-10-CM | POA: Diagnosis not present

## 2022-04-11 DIAGNOSIS — I5021 Acute systolic (congestive) heart failure: Secondary | ICD-10-CM

## 2022-04-11 DIAGNOSIS — R4589 Other symptoms and signs involving emotional state: Secondary | ICD-10-CM

## 2022-04-11 DIAGNOSIS — R778 Other specified abnormalities of plasma proteins: Secondary | ICD-10-CM | POA: Diagnosis not present

## 2022-04-11 HISTORY — PX: RIGHT/LEFT HEART CATH AND CORONARY ANGIOGRAPHY: CATH118266

## 2022-04-11 LAB — CBC WITH DIFFERENTIAL/PLATELET
Abs Immature Granulocytes: 0.07 10*3/uL (ref 0.00–0.07)
Basophils Absolute: 0 10*3/uL (ref 0.0–0.1)
Basophils Relative: 0 %
Eosinophils Absolute: 0.1 10*3/uL (ref 0.0–0.5)
Eosinophils Relative: 1 %
HCT: 46.8 % — ABNORMAL HIGH (ref 36.0–46.0)
Hemoglobin: 15.1 g/dL — ABNORMAL HIGH (ref 12.0–15.0)
Immature Granulocytes: 1 %
Lymphocytes Relative: 27 %
Lymphs Abs: 2.6 10*3/uL (ref 0.7–4.0)
MCH: 30.9 pg (ref 26.0–34.0)
MCHC: 32.3 g/dL (ref 30.0–36.0)
MCV: 95.7 fL (ref 80.0–100.0)
Monocytes Absolute: 0.9 10*3/uL (ref 0.1–1.0)
Monocytes Relative: 9 %
Neutro Abs: 6 10*3/uL (ref 1.7–7.7)
Neutrophils Relative %: 62 %
Platelets: 308 10*3/uL (ref 150–400)
RBC: 4.89 MIL/uL (ref 3.87–5.11)
RDW: 13.1 % (ref 11.5–15.5)
WBC: 9.7 10*3/uL (ref 4.0–10.5)
nRBC: 0 % (ref 0.0–0.2)

## 2022-04-11 LAB — POCT I-STAT EG7
Acid-Base Excess: 7 mmol/L — ABNORMAL HIGH (ref 0.0–2.0)
Acid-Base Excess: 8 mmol/L — ABNORMAL HIGH (ref 0.0–2.0)
Bicarbonate: 34.8 mmol/L — ABNORMAL HIGH (ref 20.0–28.0)
Bicarbonate: 35.2 mmol/L — ABNORMAL HIGH (ref 20.0–28.0)
Calcium, Ion: 1.27 mmol/L (ref 1.15–1.40)
Calcium, Ion: 1.28 mmol/L (ref 1.15–1.40)
HCT: 45 % (ref 36.0–46.0)
HCT: 45 % (ref 36.0–46.0)
Hemoglobin: 15.3 g/dL — ABNORMAL HIGH (ref 12.0–15.0)
Hemoglobin: 15.3 g/dL — ABNORMAL HIGH (ref 12.0–15.0)
O2 Saturation: 64 %
O2 Saturation: 64 %
Potassium: 4.6 mmol/L (ref 3.5–5.1)
Potassium: 4.7 mmol/L (ref 3.5–5.1)
Sodium: 137 mmol/L (ref 135–145)
Sodium: 138 mmol/L (ref 135–145)
TCO2: 37 mmol/L — ABNORMAL HIGH (ref 22–32)
TCO2: 37 mmol/L — ABNORMAL HIGH (ref 22–32)
pCO2, Ven: 58.6 mmHg (ref 44–60)
pCO2, Ven: 59 mmHg (ref 44–60)
pH, Ven: 7.382 (ref 7.25–7.43)
pH, Ven: 7.383 (ref 7.25–7.43)
pO2, Ven: 35 mmHg (ref 32–45)
pO2, Ven: 35 mmHg (ref 32–45)

## 2022-04-11 LAB — GLUCOSE, CAPILLARY
Glucose-Capillary: 156 mg/dL — ABNORMAL HIGH (ref 70–99)
Glucose-Capillary: 107 mg/dL — ABNORMAL HIGH (ref 70–99)
Glucose-Capillary: 207 mg/dL — ABNORMAL HIGH (ref 70–99)

## 2022-04-11 LAB — POCT I-STAT 7, (LYTES, BLD GAS, ICA,H+H)
Acid-Base Excess: 7 mmol/L — ABNORMAL HIGH (ref 0.0–2.0)
Bicarbonate: 33.7 mmol/L — ABNORMAL HIGH (ref 20.0–28.0)
Calcium, Ion: 1.29 mmol/L (ref 1.15–1.40)
HCT: 45 % (ref 36.0–46.0)
Hemoglobin: 15.3 g/dL — ABNORMAL HIGH (ref 12.0–15.0)
O2 Saturation: 97 %
Potassium: 4.8 mmol/L (ref 3.5–5.1)
Sodium: 137 mmol/L (ref 135–145)
TCO2: 35 mmol/L — ABNORMAL HIGH (ref 22–32)
pCO2 arterial: 54.4 mmHg — ABNORMAL HIGH (ref 32–48)
pH, Arterial: 7.4 (ref 7.35–7.45)
pO2, Arterial: 91 mmHg (ref 83–108)

## 2022-04-11 LAB — COMPREHENSIVE METABOLIC PANEL
ALT: 44 U/L (ref 0–44)
AST: 22 U/L (ref 15–41)
Albumin: 2.5 g/dL — ABNORMAL LOW (ref 3.5–5.0)
Alkaline Phosphatase: 76 U/L (ref 38–126)
Anion gap: 7 (ref 5–15)
BUN: 31 mg/dL — ABNORMAL HIGH (ref 6–20)
CO2: 30 mmol/L (ref 22–32)
Calcium: 8.9 mg/dL (ref 8.9–10.3)
Chloride: 99 mmol/L (ref 98–111)
Creatinine, Ser: 1.28 mg/dL — ABNORMAL HIGH (ref 0.44–1.00)
GFR, Estimated: 49 mL/min — ABNORMAL LOW (ref >60)
Glucose, Bld: 145 mg/dL — ABNORMAL HIGH (ref 70–99)
Potassium: 5 mmol/L (ref 3.5–5.1)
Sodium: 136 mmol/L (ref 135–145)
Total Bilirubin: 0.7 mg/dL (ref 0.3–1.2)
Total Protein: 5.3 g/dL — ABNORMAL LOW (ref 6.5–8.1)

## 2022-04-11 LAB — MAGNESIUM: Magnesium: 2.5 mg/dL — ABNORMAL HIGH (ref 1.7–2.4)

## 2022-04-11 LAB — PHOSPHORUS: Phosphorus: 4.9 mg/dL — ABNORMAL HIGH (ref 2.5–4.6)

## 2022-04-11 SURGERY — RIGHT/LEFT HEART CATH AND CORONARY ANGIOGRAPHY
Anesthesia: LOCAL

## 2022-04-11 MED ORDER — ONDANSETRON HCL 4 MG/2ML IJ SOLN: 4.0000 mg | Freq: Four times a day (QID) | INTRAMUSCULAR | Status: DC | PRN

## 2022-04-11 MED ORDER — FENTANYL CITRATE (PF) 100 MCG/2ML IJ SOLN
INTRAMUSCULAR | Status: DC | PRN
  Administered 2022-04-11 (×2): 25 ug via INTRAVENOUS

## 2022-04-11 MED ORDER — SODIUM CHLORIDE 0.9 % IV SOLN
250.0000 mL | INTRAVENOUS | Status: DC | PRN
Start: 2022-04-11 — End: 2022-04-13

## 2022-04-11 MED ORDER — LIDOCAINE HCL (PF) 1 % IJ SOLN
INTRAMUSCULAR | Status: DC | PRN
  Administered 2022-04-11 (×2): 2 mL

## 2022-04-11 MED ORDER — LABETALOL HCL 5 MG/ML IV SOLN: 10.0000 mg | INTRAVENOUS | Status: AC | PRN

## 2022-04-11 MED ORDER — HEPARIN SODIUM (PORCINE) 1000 UNIT/ML IJ SOLN
INTRAMUSCULAR | Status: DC | PRN
  Administered 2022-04-11: 3500 [IU] via INTRAVENOUS

## 2022-04-11 MED ORDER — FENTANYL CITRATE (PF) 100 MCG/2ML IJ SOLN
INTRAMUSCULAR | Status: AC
  Filled 2022-04-11: qty 2

## 2022-04-11 MED ORDER — SODIUM CHLORIDE 0.9 % IV SOLN
INTRAVENOUS | Status: AC | PRN
  Administered 2022-04-11: 10 mL/h via INTRAVENOUS

## 2022-04-11 MED ORDER — MIDAZOLAM HCL 2 MG/2ML IJ SOLN
INTRAMUSCULAR | Status: DC | PRN
  Administered 2022-04-11: 2 mg via INTRAVENOUS

## 2022-04-11 MED ORDER — IOHEXOL 350 MG/ML SOLN
INTRAVENOUS | Status: DC | PRN
  Administered 2022-04-11: 40 mL

## 2022-04-11 MED ORDER — LIDOCAINE HCL (PF) 1 % IJ SOLN
INTRAMUSCULAR | Status: AC
  Filled 2022-04-11: qty 30

## 2022-04-11 MED ORDER — HEPARIN (PORCINE) IN NACL 1000-0.9 UT/500ML-% IV SOLN
INTRAVENOUS | Status: AC
  Filled 2022-04-11: qty 1000

## 2022-04-11 MED ORDER — HEPARIN SODIUM (PORCINE) 1000 UNIT/ML IJ SOLN
INTRAMUSCULAR | Status: AC
  Filled 2022-04-11: qty 10

## 2022-04-11 MED ORDER — SODIUM CHLORIDE 0.9% FLUSH
3.0000 mL | Freq: Two times a day (BID) | INTRAVENOUS | Status: DC
  Administered 2022-04-12 – 2022-04-13 (×3): 3 mL via INTRAVENOUS

## 2022-04-11 MED ORDER — SODIUM CHLORIDE 0.9% FLUSH: 3.0000 mL | INTRAVENOUS | Status: DC | PRN

## 2022-04-11 MED ORDER — HEPARIN (PORCINE) IN NACL 1000-0.9 UT/500ML-% IV SOLN
INTRAVENOUS | Status: DC | PRN
  Administered 2022-04-11 (×2): 500 mL

## 2022-04-11 MED ORDER — HYDRALAZINE HCL 20 MG/ML IJ SOLN
10.0000 mg | INTRAMUSCULAR | Status: AC | PRN
Start: 2022-04-11 — End: 2022-04-11

## 2022-04-11 MED ORDER — ACETAMINOPHEN 325 MG PO TABS
650.0000 mg | ORAL_TABLET | ORAL | Status: DC | PRN
  Administered 2022-04-13: 650 mg via ORAL
  Filled 2022-04-11: qty 2

## 2022-04-11 MED ORDER — VERAPAMIL HCL 2.5 MG/ML IV SOLN
INTRAVENOUS | Status: DC | PRN
  Administered 2022-04-11: 3 mL via INTRA_ARTERIAL
  Administered 2022-04-11: 10 mL via INTRA_ARTERIAL

## 2022-04-11 MED ORDER — VERAPAMIL HCL 2.5 MG/ML IV SOLN
INTRAVENOUS | Status: AC
  Filled 2022-04-11: qty 2

## 2022-04-11 MED ORDER — MIDAZOLAM HCL 2 MG/2ML IJ SOLN
INTRAMUSCULAR | Status: AC
  Filled 2022-04-11: qty 2

## 2022-04-11 SURGICAL SUPPLY — 14 items
BAND CMPR LRG ZPHR (HEMOSTASIS) ×1
BAND ZEPHYR COMPRESS 30 LONG (HEMOSTASIS) ×1 IMPLANT
CATH 5FR JL3.5 JR4 ANG PIG MP (CATHETERS) ×1 IMPLANT
CATH BALLN WEDGE 5F 110CM (CATHETERS) ×1 IMPLANT
GLIDESHEATH SLEND SS 6F .021 (SHEATH) ×1 IMPLANT
GUIDEWIRE INQWIRE 1.5J.035X260 (WIRE) IMPLANT
INQWIRE 1.5J .035X260CM (WIRE) ×2
KIT HEART LEFT (KITS) ×2 IMPLANT
PACK CARDIAC CATHETERIZATION (CUSTOM PROCEDURE TRAY) ×2 IMPLANT
SHEATH GLIDE SLENDER 4/5FR (SHEATH) ×1 IMPLANT
SHEATH PROBE COVER 6X72 (BAG) ×1 IMPLANT
TRANSDUCER W/STOPCOCK (MISCELLANEOUS) ×2 IMPLANT
TUBING CIL FLEX 10 FLL-RA (TUBING) ×2 IMPLANT
WIRE EMERALD 3MM-J .025X260CM (WIRE) ×1 IMPLANT

## 2022-04-11 NOTE — Progress Notes (Signed)
PROGRESS NOTE    Kynzlee Kassner  ZOX:[id number] DOB: 1965-12-09 DOA: 04/06/2022 PCP: Grayce Sessions, NP  Chief Complaint  Patient presents with   Shortness of Breath    Brief Narrative:  Arrianna Yorke is Mckinzy Fuller 56 y.o. female with medical history significant of hypertension, hyperlipidemia, congestive heart failure, diabetes mellitus type 2, COPD, and prior tobacco abuse who presents with complaints of shortness of breath progressively worsening over the last 2 weeks.  She complains of having progressively worsening swelling of her feet to the point which she was unable to see her ankles.  Patient admits that she had ran out of Nastasia Kage lot of her medications, but states that she is not quite sure which ones.  She states she is followed by Dr. Gala Romney and has Janaisa Birkland history of heart failure with leaky valve.  She has had some intermittent left-sided chest pains and wheezing in addition to her shortness of breath.  Patient reports that she fell twice due to feeling lightheaded related to her symptoms, and has been having right side pain.  Upon further questioning patient does admit to recently using cocaine Becki Mccaskill couple days ago and still intermittently smokes cigarettes from time to time.  Denies having any significant fever, nausea, vomiting, or diarrhea symptoms.   Upon admission into the emergency department patient was seen to be afebrile with blood pressures elevated up to 168/120, respirations 15-26, and all other vital signs maintained.  Patient was placed on BiPAP due to work of breathing, but was never documented to have drop in O2 saturations.  Labs significant for glucose 285, albumin 2.6, AST 74, ALT 75, D-dimer 0.56, and high-sensitivity troponin 46->43.  Chest x-ray noted enlarged heart with no acute findings.  BNP had not resulted.  Patient had been given 125 mg of Solu-Medrol IV, DuoNeb breathing treatment, and Lasix 40 mg IV.      Assessment & Plan:   Principal Problem:   Acute exacerbation  of CHF (congestive heart failure) (HCC) Active Problems:   Acute respiratory failure with hypoxia (HCC)   Elevated troponin   COPD without exacerbation (HCC)   Acute metabolic encephalopathy   Essential hypertension, benign   Controlled type 2 diabetes mellitus with hyperglycemia (HCC)   Transaminitis   Hypoalbuminemia   Polysubstance abuse (HCC)   Headache   Elevated LFTs   Obesity (BMI 30-39.9)   Depressed mood   Nonrheumatic aortic valve insufficiency   Assessment and Plan: * Acute exacerbation of CHF (congestive heart failure) (HCC) Patient presents with complaints of progressively worsening lower extremity swelling and shortness of breath over the last 2 weeks.   CXR 6/15 without acute findings Initially required bipap Improved edema today BNP was elevated at 1508.2 echocardiogram from 2019 noted EF of 55-65% with grade 1 diastolic dysfunction Repeat echo with EF 30-35%, grade 1 diastolic dysfunction, moderate to severe MVR, moderate to severe TVR, moderate to severe AVR (see report) Diuresis per cardiology Coreg, farxiga, spironolactone (spiro mentioned in cards note, but not ordered, will follow) Appreciate cardiology recs - planning to resume GDMT as tolerated.  Planning for [facility] Monday - pending  Acute respiratory failure with hypoxia (HCC) Currently on RA CXR without acute findings, though exam at presentation concerning for HF Mild wheezing at times - improved, continue breo/incruse, give albuterol and follow - I don't think she needs steroids at this time Low threshold for additional w/u (CT PE protocol) - she's now improved - follow   Elevated troponin CP seems msk vs pleuritic/related to  cough Elevated, but flat Appreciate cards recs, low suspicion for acs   Acute metabolic encephalopathy Called to bedside due to encephalopathy on 6/16 She was coming around by the time I got there, but was described as somnolent in cards notes VBG without hypercarbia tox  screen remain positive only for cocaine Unclear cause of her encephalopathy, she got ativan late night prior? Possible contributor?  I don't see other possibly contributing meds.   Delirium precautions Narcan prn  Follow closely - seems overall improved    COPD without exacerbation (HCC) Continue home meds Improved wheezing S/p solumedrol in ED, I don't think she needs steroids, but will continue nebs for now  Essential hypertension, benign Coreg Follow for adjustments  Controlled type 2 diabetes mellitus with hyperglycemia (HCC) a1c 10/2021 6.2 Repeat SSI Started on farxiga  Transaminitis Related to HF? Cocaine use? Negative acute hepatitis panel trend  Hypoalbuminemia Follow RD  Polysubstance abuse (HCC) utox positive for cocaine, encourage cessation  Headache resolved  Depressed mood Will ask psych to see, sounds like lots of social stressors She wasn't initially able to say whether she was having any SI Addendum: evaluated later in afternoon (6/16), this time, she clearly states no desire to hurt herself or end her life.  She's still open to psychiatry seeing her. Psych recommending lexapro  Obesity (BMI 30-39.9) noted     DVT prophylaxis: lovenox Code Status: full Family Communication: none Disposition:   Status is: Inpatient Remains inpatient appropriate because: need for further management by cards, encephalopathy   Consultants:  Cards psych  Procedures:  IMPRESSIONS     1. Significant change in LVEF was 55-60% in 2019. Left ventricular  ejection fraction, by estimation, is 30 to 35%. The left ventricle has  moderately decreased function. The left ventricle demonstrates global  hypokinesis. The left ventricular internal  cavity size was moderately dilated. Left ventricular diastolic parameters  are consistent with Grade I diastolic dysfunction (impaired relaxation).   2. Right ventricular systolic function is normal. The right ventricular   size is normal. There is mildly elevated pulmonary artery systolic  pressure.   3. Left atrial size was severely dilated.   4. Right atrial size was severely dilated.   5. The mitral valve is normal in structure. Moderate to severe mitral  valve regurgitation. Mild mitral stenosis. The mean mitral valve gradient  is 8.5 mmHg.   6. Tricuspid valve regurgitation is moderate to severe.   7. The aortic valve is normal in structure. Aortic valve regurgitation is  moderate to severe. No aortic stenosis is present.   8. The inferior vena cava is normal in size with greater than 50%  respiratory variability, suggesting right atrial pressure of 3 mmHg.   Comparison(s): Javohn Basey prior study was performed on 04/10/2018. The left  ventricular function is significantly worse. Aortic regugitation, mitral  regurgitation and tricuspid regurgitation have worsened.   Antimicrobials:  Anti-infectives (From admission, onward)    None       Subjective: No new complaints Asking to shower prior to cath  Objective: Vitals:   04/11/22 1414 04/11/22 1419 04/11/22 1424 04/11/22 1433  BP: (!) 145/72     Pulse: 83 (!) 0 (!) 0 82  Resp: (!) 23   18  Temp:    98 F (36.7 C)  TempSrc:    Oral  SpO2: 99%   98%  Weight:      Height:        Intake/Output Summary (Last 24 hours) at 04/11/2022 1545  Last data filed at 04/11/2022 0600 Gross per 24 hour  Intake 240 ml  Output 2050 ml  Net -1810 ml   Filed Weights   04/09/22 0505 04/10/22 0428 04/11/22 0128  Weight: 71.4 kg 70.6 kg 69.7 kg    Examination:  General: No acute distress. Cardiovascular: RRR Lungs: Clear to auscultation bilaterally Neurological: Alert and oriented 3. Moves all extremities 4 with equal strength. Cranial nerves II through XII grossly intact. Extremities: No clubbing or cyanosis. No edema  Data Reviewed: I have personally reviewed following labs and imaging studies  CBC: Recent Labs  Lab 04/07/22 0420 04/08/22 0341  04/09/22 0208 04/10/22 0300 04/11/22 0420  WBC 11.6* 11.0* 9.7 9.2 9.7  NEUTROABS  --  6.5 6.2 6.1 6.0  HGB 13.4 13.9 13.7 14.5 15.1*  HCT 41.2 45.0 44.2 45.1 46.8*  MCV 95.6 100.2* 97.1 96.8 95.7  PLT 260 257 265 271 308    Basic Metabolic Panel: Recent Labs  Lab 04/06/22 0820 04/07/22 0420 04/08/22 0341 04/09/22 0208 04/10/22 0300 04/11/22 0420  NA 139  140 142 140 139 136 136  K 4.1  4.1 3.7 3.9 3.8 4.3 5.0  CL 108 104 108 99 98 99  CO2  --  29 21* 28 27 30   GLUCOSE 285* 125* 100* 105* 236* 145*  BUN 23* 26* 30* 32* 34* 31*  CREATININE 0.90 1.21* 1.04* 1.07* 1.20* 1.28*  CALCIUM  --  8.8* 8.5* 8.3* 8.4* 8.9  MG 1.9  --  2.1 2.4 2.3 2.5*  PHOS  --   --  4.7* 4.1 5.0* 4.9*    GFR: Estimated Creatinine Clearance: 41.7 mL/min (Lasasha Brophy) (by C-G formula based on SCr of 1.28 mg/dL (H)).  Liver Function Tests: Recent Labs  Lab 04/07/22 0420 04/08/22 0341 04/09/22 0208 04/10/22 0300 04/11/22 0420  AST 32 44* 31 28 22   ALT 61* 64* 59* 48* 44  ALKPHOS 85 82 87 84 76  BILITOT 0.7 0.6 0.7 1.1 0.7  PROT 4.5* 4.4* 4.7* 4.9* 5.3*  ALBUMIN 2.4* 2.4* 2.5* 2.5* 2.5*    CBG: Recent Labs  Lab 04/10/22 1046 04/10/22 1627 04/10/22 2023 04/11/22 0528 04/11/22 1229  GLUCAP 196* 207* 155* 156* 107*     Recent Results (from the past 240 hour(s))  Resp Panel by RT-PCR (Flu Varnika Butz&B, Covid) Anterior Nasal Swab     Status: None   Collection Time: 04/06/22  5:17 PM   Specimen: Anterior Nasal Swab  Result Value Ref Range Status   SARS Coronavirus 2 by RT PCR NEGATIVE NEGATIVE Final    Comment: (NOTE) SARS-CoV-2 target nucleic acids are NOT DETECTED.  The SARS-CoV-2 RNA is generally detectable in upper respiratory specimens during the acute phase of infection. The lowest concentration of SARS-CoV-2 viral copies this assay can detect is 138 copies/mL. Jaylee Lantry negative result does not preclude SARS-Cov-2 infection and should not be used as the sole basis for treatment or other patient  management decisions. Niobe Dick negative result may occur with  improper specimen collection/handling, submission of specimen other than nasopharyngeal swab, presence of viral mutation(s) within the areas targeted by this assay, and inadequate number of viral copies(<138 copies/mL). Geanna Divirgilio negative result must be combined with clinical observations, patient history, and epidemiological information. The expected result is Negative.  Fact Sheet for Patients:  BloggerCourse.com  Fact Sheet for Healthcare Providers:  SeriousBroker.it  This test is no t yet approved or cleared by the [place] FDA and  has been authorized for detection and/or diagnosis of SARS-CoV-2  by FDA under an Emergency Use Authorization (EUA). This EUA will remain  in effect (meaning this test can be used) for the duration of the COVID-19 declaration under Section 564(b)(1) of the Act, 21 U.S.C.section 360bbb-3(b)(1), unless the authorization is terminated  or revoked sooner.       Influenza Jashiya Bassett by PCR NEGATIVE NEGATIVE Final   Influenza B by PCR NEGATIVE NEGATIVE Final    Comment: (NOTE) The Xpert Xpress SARS-CoV-2/FLU/RSV plus assay is intended as an aid in the diagnosis of influenza from Nasopharyngeal swab specimens and should not be used as Leili Eskenazi sole basis for treatment. Nasal washings and aspirates are unacceptable for Xpert Xpress SARS-CoV-2/FLU/RSV testing.  Fact Sheet for Patients: BloggerCourse.com  Fact Sheet for Healthcare Providers: SeriousBroker.it  This test is not yet approved or cleared by the [place] FDA and has been authorized for detection and/or diagnosis of SARS-CoV-2 by FDA under an Emergency Use Authorization (EUA). This EUA will remain in effect (meaning this test can be used) for the duration of the COVID-19 declaration under Section 564(b)(1) of the Act, 21 U.S.C. section 360bbb-3(b)(1),  unless the authorization is terminated or revoked.  Performed at [facility], [address] [place], [place] [address]          Radiology Studies: CARDIAC CATHETERIZATION  Result Date: 04/11/2022   There is severe left ventricular systolic dysfunction.   LV end diastolic pressure is normal.   The left ventricular ejection fraction is less than 25% by visual estimate.   Hemodynamic findings consistent with mild pulmonary hypertension.   There is no aortic valve stenosis.   Aortic saturation 97%, PA saturation 64%, PA pressure 36/19, mean PA pressure 27 mmHg, mean pulmonary capillary wedge pressure 14 mmHg, cardiac output 3.28 L/min, cardiac index 1.96. No angiographically apparent coronary artery disease.  Fairly well controlled right heart pressures as well. Continue medical therapy for nonischemic cardiomyopathy.  I stressed the importance of medication compliance.        Scheduled Meds:  carvedilol  3.125 mg Oral BID WC   dapagliflozin propanediol  10 mg Oral Daily   enoxaparin (LOVENOX) injection  40 mg Subcutaneous Daily   escitalopram  20 mg Oral Daily   feeding supplement (GLUCERNA SHAKE)  237 mL Oral TID BM   fluticasone furoate-vilanterol  1 puff Inhalation Daily   And   umeclidinium bromide  1 puff Inhalation Daily   insulin aspart  0-5 Units Subcutaneous QHS   insulin aspart  0-9 Units Subcutaneous TID WC   multivitamin with minerals  1 tablet Oral Daily   sodium chloride flush  3 mL Intravenous Q12H   sodium chloride flush  3 mL Intravenous Q12H   sodium chloride flush  3 mL Intravenous Q12H   Continuous Infusions:  sodium chloride       LOS: 5 days    Time spent: over 30 min    Lacretia Nicks, MD [facility]   To contact the attending provider between 7A-7P or the covering provider during after hours 7P-7A, please log into the web site [email or web address] and access using universal [place] password for that web site. If you do not have  the password, please call the hospital operator.  04/11/2022, 3:45 PM

## 2022-04-11 NOTE — H&P (View-Only) (Signed)
Progress Note  Patient Name: Stacey Lin Date of Encounter: 04/11/2022  [facility] Cardiologist: None   Subjective   Denies any chest pain or SOB.  Bp stable.  Inpatient Medications    Scheduled Meds:  carvedilol  3.125 mg Oral BID WC   dapagliflozin propanediol  10 mg Oral Daily   enoxaparin (LOVENOX) injection  40 mg Subcutaneous Daily   escitalopram  20 mg Oral Daily   feeding supplement (GLUCERNA SHAKE)  237 mL Oral TID BM   fluticasone furoate-vilanterol  1 puff Inhalation Daily   And   umeclidinium bromide  1 puff Inhalation Daily   furosemide  40 mg Oral Daily   insulin aspart  0-5 Units Subcutaneous QHS   insulin aspart  0-9 Units Subcutaneous TID WC   multivitamin with minerals  1 tablet Oral Daily   sodium chloride flush  3 mL Intravenous Q12H   sodium chloride flush  3 mL Intravenous Q12H   Continuous Infusions:  sodium chloride     sodium chloride     PRN Meds: sodium chloride, acetaminophen **OR** acetaminophen, albuterol, hydrALAZINE, LORazepam, naLOXone (NARCAN)  injection, sodium chloride flush   Vital Signs    Vitals:   04/10/22 2012 04/10/22 2014 04/11/22 0013 04/11/22 0128  BP:  (!) 113/56 115/81   Pulse:  85    Resp:      Temp:  98 F (36.7 C) 98.3 F (36.8 C)   TempSrc:  Oral Oral   SpO2: 97% 100% 100%   Weight:    69.7 kg  Height:        Intake/Output Summary (Last 24 hours) at 04/11/2022 0947 Last data filed at 04/11/2022 0600 Gross per 24 hour  Intake 717 ml  Output 3550 ml  Net -2833 ml       04/11/2022    1:28 AM 04/10/2022    4:28 AM 04/09/2022    5:05 AM  Last 3 Weights  Weight (lbs) 153 lb 11.2 oz 155 lb 11.2 oz 157 lb 8 oz  Weight (kg) 69.718 kg 70.625 kg 71.442 kg      Telemetry    Normal sinus rhythm - personally Reviewed  ECG    No new EKG to review- Personally Reviewed  Physical Exam   GEN: Well nourished, well developed in no acute distress HEENT: Normal NECK: No JVD; No carotid  bruits LYMPHATICS: No lymphadenopathy CARDIAC:RRR, no murmurs, rubs, gallops RESPIRATORY:  Clear to auscultation without rales, wheezing or rhonchi  ABDOMEN: Soft, non-tender, non-distended MUSCULOSKELETAL:  No edema; No deformity  SKIN: Warm and dry NEUROLOGIC:  Alert and oriented x 3 PSYCHIATRIC:  Normal affect   Labs    High Sensitivity Troponin:   Recent Labs  Lab 04/06/22 0740 04/06/22 0945  TROPONINIHS 46* 43*      Chemistry Recent Labs  Lab 04/09/22 0208 04/10/22 0300 04/11/22 0420  NA 139 136 136  K 3.8 4.3 5.0  CL 99 98 99  CO2 '28 27 30  '$ GLUCOSE 105* 236* 145*  BUN 32* 34* 31*  CREATININE 1.07* 1.20* 1.28*  CALCIUM 8.3* 8.4* 8.9  MG 2.4 2.3 2.5*  PROT 4.7* 4.9* 5.3*  ALBUMIN 2.5* 2.5* 2.5*  AST '31 28 22  '$ ALT 59* 48* 44  ALKPHOS 87 84 76  BILITOT 0.7 1.1 0.7  GFRNONAA >60 52* 49*  ANIONGAP '12 11 7     '$ Lipids No results for input(s): "CHOL", "TRIG", "HDL", "LABVLDL", "LDLCALC", "CHOLHDL" in the last 168 hours.  Hematology Recent Labs  Lab 04/09/22 0208 04/10/22 0300 04/11/22 0420  WBC 9.7 9.2 9.7  RBC 4.55 4.66 4.89  HGB 13.7 14.5 15.1*  HCT 44.2 45.1 46.8*  MCV 97.1 96.8 95.7  MCH 30.1 31.1 30.9  MCHC 31.0 32.2 32.3  RDW 13.3 13.2 13.1  PLT 265 271 308    Thyroid  Recent Labs  Lab 04/06/22 1745  TSH 0.459     BNP Recent Labs  Lab 04/06/22 0740  BNP 1,508.2*     DDimer  Recent Labs  Lab 04/06/22 0820  DDIMER 0.56*      Radiology    No results found.  Cardiac Studies   Echocardiogram 04/07/22 1. Significant change in LVEF was 55-60% in 2019. Left ventricular  ejection fraction, by estimation, is 30 to 35%. The left ventricle has  moderately decreased function. The left ventricle demonstrates global  hypokinesis. The left ventricular internal  cavity size was moderately dilated. Left ventricular diastolic parameters  are consistent with Grade I diastolic dysfunction (impaired relaxation).   2. Right ventricular  systolic function is normal. The right ventricular  size is normal. There is mildly elevated pulmonary artery systolic  pressure.   3. Left atrial size was severely dilated.   4. Right atrial size was severely dilated.   5. The mitral valve is normal in structure. Moderate to severe mitral  valve regurgitation. Mild mitral stenosis. The mean mitral valve gradient  is 8.5 mmHg.   6. Tricuspid valve regurgitation is moderate to severe.   7. The aortic valve is normal in structure. Aortic valve regurgitation is  moderate to severe. No aortic stenosis is present.   8. The inferior vena cava is normal in size with greater than 50%  respiratory variability, suggesting right atrial pressure of 3 mmHg.   Comparison(s): A prior study was performed on 04/10/2018. The left  ventricular function is significantly worse. Aortic regugitation, mitral  regurgitation and tricuspid regurgitation have worsened.   Patient Profile     56 y.o. female with a history of heart failure, HLD, HTN, mitral valve regurgitation, polysubstance abuse, asthma, anemia who presented with SOB, was seen 6/14 for evaluation of decompensated heart failure  Assessment & Plan    Altered Level of Consciousness  -Urine drug screen was positive for cocaine -Psych consult in progress  Acute decompensated systolic heart failure  - Patient previously had a reduced EF of 20-25% in 03/2016. Echo in 03/2017 showed that EF had improved to 60-65% with grade I diastolic dysfunction  - Echo this admission showed EF 42-59%, grade I diastolic dysfunction, normal RV systolic function, severe dilation of LA and RA, moderate to severe MVR, moderate to severe  TVR, moderate to severe AI  - Suspect reduced EF is in part due to substance abuse and noncompliance with home medications. However patient has not recently had ischemic evaluation  - Patient has been educated on the importance of medication compliance, stopping substance abuse.  - serum  creatinine slightly increased from 1.07->>1.2->> 1.28 today - BP did fall with diuresis but now normalized - now on Lasix '40mg'$  PO daily>>hold today for cath - She put out 3.55 L yesterday and is net - 8 L since admission - Continue farxiga 10 mg daily and carvedilol 3.125 mg twice daily - Hopefully blood pressure will remain stable enough that we can add losartan  and Spiro post cath with plan to transition [facility] outpatient - plan for right and left heart cath today to assess filling pressures and define coronary anatomy -  Patient has been seen by [facility] in the past, consider consulting them this admission. However, as patient is noncompliant and has been using substances, not sure how much they could offer    History of Rheumatic Valvular Heart Disease  - Most recent echocardiogram in 03/2018 showed mild-moderate aortic valve regurgitation, mild mitral valve regurgitation, moderate tricuspid valve regurgitation  - Echo this admission with severe dilation of LA and RA, moderate to severe MVR, moderate to severe AI  - Her valvular heart disease will need to be addressed post cath    Polysubstance Abuse - Continues to use cocaine and smoke cigarettes  - Patient has been counseled on the importance of stopping these substances, and their negative effects on heart health    Elevated trop  - hsTn 46>>43 - Minimal elevation with flat trend-- not consistent with Acs, suspect secondary to demand in the setting of heart failure exacerbation  - She does have multiple cardiac risk factors for CAD and therefore I do think with the drop in EF that ischemic work-up with cardiac cath would be appropriate -Plan for right and left heart catheterization today  Otherwise Per primary  - Type 2 DM  - Transaminitis  - Hypoalbuminemia  - COPD  I have spent a total of 35 minutes with patient reviewing 2D echo , telemetry, EKGs, labs and examining patient as well as establishing an assessment  and plan that was discussed with the patient.  > 50% of time was spent in direct patient care.     For questions or updates, please contact [place] Please consult [email or web address] for contact info under    Signed, Fransico Him, MD  04/11/2022, 9:47 AM

## 2022-04-11 NOTE — Progress Notes (Signed)
   04/11/22 0935  Clinical Encounter Type  Visited With Patient not available;Health care provider  Visit Type Initial  Referral From Physician (A. Melven Sartorius., MD)  Consult/Referral To Chaplain Melvenia Beam)   Upon knocking on door and announcing Chaplain visit, patient invited chaplain into room. Privacy curtain was pulled closed and lights were off. Patient unclothed upon chaplain entering room. Chaplain excused myself and left room. Charge Nurse advised. [address] [place], [place] [phone].

## 2022-04-11 NOTE — Interval H&P Note (Signed)
Cath Lab Visit (complete for each Cath Lab visit)  Clinical Evaluation Leading to the Procedure:   ACS: Yes.    Non-ACS:    Anginal Classification: CCS IV  Anti-ischemic medical therapy: Minimal Therapy (1 class of medications)  Non-Invasive Test Results: No non-invasive testing performed  Prior CABG: No previous CABG   Low EF   History and Physical Interval Note:  04/11/2022 1:34 PM  Stacey Lin  has presented today for surgery, with the diagnosis of unstable angina.  The various methods of treatment have been discussed with the patient and family. After consideration of risks, benefits and other options for treatment, the patient has consented to  Procedure(s): RIGHT/LEFT HEART CATH AND CORONARY ANGIOGRAPHY (N/A) as a surgical intervention.  The patient's history has been reviewed, patient examined, no change in status, stable for surgery.  I have reviewed the patient's chart and labs.  Questions were answered to the patient's satisfaction.     Larae Grooms

## 2022-04-11 NOTE — TOC Initial Note (Signed)
Transition of Care [facility]) - Initial/Assessment Note    Patient Details  Name: Stacey Lin MRN: [id number] Date of Birth: 30-Jun-1966  Transition of Care [facility]) CM/SW Contact:    Erin Sons, LCSW Phone Number: 04/11/2022, 3:52 PM  Clinical Narrative:                  TOC substance use consult placed. CSW met with pt bedside with daughter present. Pt consents to CSW discussing substance use with daughter present. She states, "She knows what I do." Pt is slightly guarded but is able to provide some history. Pt lives at home with her brother. She reports she had a long period of being clean though relapsed about a year ago. Pt states last use was 2 weeks ago. She reports only history of tx was residential treatment in the 5's. She is mainly interested in NA meeting information but is open to hearing about tx options. CSW provides tx resource list and explains the different levels of care. CSW explains and circles agencies on list that are most appropriate for her and her insurance. CSW also provides NA meeting list and literature.   Expected Discharge Plan: Home/Self Care Barriers to Discharge: Continued Medical Work up   Patient Goals and CMS Choice        Expected Discharge Plan and Services Expected Discharge Plan: Home/Self Care       Living arrangements for the past 2 months: Single Family Home                                      Prior Living Arrangements/Services Living arrangements for the past 2 months: Single Family Home Lives with:: Self, Siblings          Need for Family Participation in Patient Care: No (Comment) Care giver support system in place?: Yes (comment)   Criminal Activity/Legal Involvement Pertinent to Current Situation/Hospitalization: No - Comment as needed  Activities of Daily Living      Permission Sought/Granted   Permission granted to share information with : Yes, Verbal Permission Granted  Share Information with NAME: Daughter  Liles           Emotional Assessment Appearance:: Appears stated age Attitude/Demeanor/Rapport: Guarded Affect (typically observed): Appropriate Orientation: : Oriented to Self, Oriented to Place, Oriented to  Time, Oriented to Situation Alcohol / Substance Use: Illicit Drugs Psych Involvement: Yes (comment) (Psych cleared)  Admission diagnosis:  Respiratory distress [R06.03] Acute exacerbation of CHF (congestive heart failure) (HCC) [I50.9] Acute on chronic combined systolic and diastolic congestive heart failure (HCC) [I50.43] Patient Active Problem List   Diagnosis Date Noted   Nonrheumatic aortic valve insufficiency    Elevated LFTs 04/08/2022   Acute metabolic encephalopathy 04/08/2022   Depressed mood 04/08/2022   Headache 04/07/2022   Obesity (BMI 30-39.9) 04/07/2022   Acute exacerbation of CHF (congestive heart failure) (HCC) 04/06/2022   Polysubstance abuse (HCC) 04/06/2022   Elevated troponin 04/06/2022   Controlled type 2 diabetes mellitus with hyperglycemia (HCC) 04/06/2022   Transaminitis 04/06/2022   Hypoalbuminemia 04/06/2022   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/11/2021   Upper airway cough syndrome 12/20/2018   Morbid obesity due to excess calories (HCC) 12/04/2018   Cigarette smoker 11/21/2018   Asthmatic bronchitis, moderate persistent, uncomplicated 11/20/2018   DOE (dyspnea on exertion) 04/14/2016   Streptococcus pneumoniae pneumonia (HCC) 04/07/2016   Acute respiratory failure with hypoxia (HCC) 04/03/2016  Elevated lactic acid level 04/03/2016   Leukocytosis 04/03/2016   New onset type 2 diabetes mellitus (HCC) 04/03/2016   Abnormal urinalysis 04/03/2016   Trichomonas infection 04/03/2016   Non compliance w medication regimen 01/12/2016   COPD without exacerbation (HCC) 12/04/2014   AKI (acute kidney injury) (HCC) 03/14/2014   Chronic systolic HF (heart failure) (HCC) 03/14/2014   Musculoskeletal chest pain 03/14/2014   Dyspnea  12/03/2013   Periodic health assessment, general screening, adult 10/03/2013   Mitral valve regurgitation 08/22/2013   FIBROIDS, UTERUS 05/11/2010   ANEMIA, SECONDARY TO BLOOD LOSS 05/11/2010   Asthmatic bronchitis 05/11/2010   TOBACCO USER 07/07/2009   Essential hypertension, benign 06/19/2009   MENORRHAGIA 06/19/2009   COCAINE ABUSE, HX OF 06/19/2009   PCP:  Grayce Sessions, NP Pharmacy:   [facility] [phone] - [place], [place] - [phone] AT [facility] OF [address] & [address] [place] [address] Phone: [phone] Fax: [phone]     Social Determinants of Health (SDOH) Interventions    Readmission Risk Interventions     No data to display

## 2022-04-11 NOTE — Progress Notes (Signed)
Progress Note  Patient Name: Stacey Lin Date of Encounter: 04/11/2022  [facility] Cardiologist: None   Subjective   Denies any chest pain or SOB.  Bp stable.  Inpatient Medications    Scheduled Meds:  carvedilol  3.125 mg Oral BID WC   dapagliflozin propanediol  10 mg Oral Daily   enoxaparin (LOVENOX) injection  40 mg Subcutaneous Daily   escitalopram  20 mg Oral Daily   feeding supplement (GLUCERNA SHAKE)  237 mL Oral TID BM   fluticasone furoate-vilanterol  1 puff Inhalation Daily   And   umeclidinium bromide  1 puff Inhalation Daily   furosemide  40 mg Oral Daily   insulin aspart  0-5 Units Subcutaneous QHS   insulin aspart  0-9 Units Subcutaneous TID WC   multivitamin with minerals  1 tablet Oral Daily   sodium chloride flush  3 mL Intravenous Q12H   sodium chloride flush  3 mL Intravenous Q12H   Continuous Infusions:  sodium chloride     sodium chloride     PRN Meds: sodium chloride, acetaminophen **OR** acetaminophen, albuterol, hydrALAZINE, LORazepam, naLOXone (NARCAN)  injection, sodium chloride flush   Vital Signs    Vitals:   04/10/22 2012 04/10/22 2014 04/11/22 0013 04/11/22 0128  BP:  (!) 113/56 115/81   Pulse:  85    Resp:      Temp:  98 F (36.7 C) 98.3 F (36.8 C)   TempSrc:  Oral Oral   SpO2: 97% 100% 100%   Weight:    69.7 kg  Height:        Intake/Output Summary (Last 24 hours) at 04/11/2022 0947 Last data filed at 04/11/2022 0600 Gross per 24 hour  Intake 717 ml  Output 3550 ml  Net -2833 ml       04/11/2022    1:28 AM 04/10/2022    4:28 AM 04/09/2022    5:05 AM  Last 3 Weights  Weight (lbs) 153 lb 11.2 oz 155 lb 11.2 oz 157 lb 8 oz  Weight (kg) 69.718 kg 70.625 kg 71.442 kg      Telemetry    Normal sinus rhythm - personally Reviewed  ECG    No new EKG to review- Personally Reviewed  Physical Exam   GEN: Well nourished, well developed in no acute distress HEENT: Normal NECK: No JVD; No carotid  bruits LYMPHATICS: No lymphadenopathy CARDIAC:RRR, no murmurs, rubs, gallops RESPIRATORY:  Clear to auscultation without rales, wheezing or rhonchi  ABDOMEN: Soft, non-tender, non-distended MUSCULOSKELETAL:  No edema; No deformity  SKIN: Warm and dry NEUROLOGIC:  Alert and oriented x 3 PSYCHIATRIC:  Normal affect   Labs    High Sensitivity Troponin:   Recent Labs  Lab 04/06/22 0740 04/06/22 0945  TROPONINIHS 46* 43*      Chemistry Recent Labs  Lab 04/09/22 0208 04/10/22 0300 04/11/22 0420  NA 139 136 136  K 3.8 4.3 5.0  CL 99 98 99  CO2 '28 27 30  '$ GLUCOSE 105* 236* 145*  BUN 32* 34* 31*  CREATININE 1.07* 1.20* 1.28*  CALCIUM 8.3* 8.4* 8.9  MG 2.4 2.3 2.5*  PROT 4.7* 4.9* 5.3*  ALBUMIN 2.5* 2.5* 2.5*  AST '31 28 22  '$ ALT 59* 48* 44  ALKPHOS 87 84 76  BILITOT 0.7 1.1 0.7  GFRNONAA >60 52* 49*  ANIONGAP '12 11 7     '$ Lipids No results for input(s): "CHOL", "TRIG", "HDL", "LABVLDL", "LDLCALC", "CHOLHDL" in the last 168 hours.  Hematology Recent Labs  Lab 04/09/22 0208 04/10/22 0300 04/11/22 0420  WBC 9.7 9.2 9.7  RBC 4.55 4.66 4.89  HGB 13.7 14.5 15.1*  HCT 44.2 45.1 46.8*  MCV 97.1 96.8 95.7  MCH 30.1 31.1 30.9  MCHC 31.0 32.2 32.3  RDW 13.3 13.2 13.1  PLT 265 271 308    Thyroid  Recent Labs  Lab 04/06/22 1745  TSH 0.459     BNP Recent Labs  Lab 04/06/22 0740  BNP 1,508.2*     DDimer  Recent Labs  Lab 04/06/22 0820  DDIMER 0.56*      Radiology    No results found.  Cardiac Studies   Echocardiogram 04/07/22 1. Significant change in LVEF was 55-60% in 2019. Left ventricular  ejection fraction, by estimation, is 30 to 35%. The left ventricle has  moderately decreased function. The left ventricle demonstrates global  hypokinesis. The left ventricular internal  cavity size was moderately dilated. Left ventricular diastolic parameters  are consistent with Grade I diastolic dysfunction (impaired relaxation).   2. Right ventricular  systolic function is normal. The right ventricular  size is normal. There is mildly elevated pulmonary artery systolic  pressure.   3. Left atrial size was severely dilated.   4. Right atrial size was severely dilated.   5. The mitral valve is normal in structure. Moderate to severe mitral  valve regurgitation. Mild mitral stenosis. The mean mitral valve gradient  is 8.5 mmHg.   6. Tricuspid valve regurgitation is moderate to severe.   7. The aortic valve is normal in structure. Aortic valve regurgitation is  moderate to severe. No aortic stenosis is present.   8. The inferior vena cava is normal in size with greater than 50%  respiratory variability, suggesting right atrial pressure of 3 mmHg.   Comparison(s): A prior study was performed on 04/10/2018. The left  ventricular function is significantly worse. Aortic regugitation, mitral  regurgitation and tricuspid regurgitation have worsened.   Patient Profile     56 y.o. female with a history of heart failure, HLD, HTN, mitral valve regurgitation, polysubstance abuse, asthma, anemia who presented with SOB, was seen 6/14 for evaluation of decompensated heart failure  Assessment & Plan    Altered Level of Consciousness  -Urine drug screen was positive for cocaine -Psych consult in progress  Acute decompensated systolic heart failure  - Patient previously had a reduced EF of 20-25% in 03/2016. Echo in 03/2017 showed that EF had improved to 60-65% with grade I diastolic dysfunction  - Echo this admission showed EF 40-34%, grade I diastolic dysfunction, normal RV systolic function, severe dilation of LA and RA, moderate to severe MVR, moderate to severe  TVR, moderate to severe AI  - Suspect reduced EF is in part due to substance abuse and noncompliance with home medications. However patient has not recently had ischemic evaluation  - Patient has been educated on the importance of medication compliance, stopping substance abuse.  - serum  creatinine slightly increased from 1.07->>1.2->> 1.28 today - BP did fall with diuresis but now normalized - now on Lasix '40mg'$  PO daily>>hold today for cath - She put out 3.55 L yesterday and is net - 8 L since admission - Continue farxiga 10 mg daily and carvedilol 3.125 mg twice daily - Hopefully blood pressure will remain stable enough that we can add losartan  and Spiro post cath with plan to transition [facility] outpatient - plan for right and left heart cath today to assess filling pressures and define coronary anatomy -  Patient has been seen by [facility] in the past, consider consulting them this admission. However, as patient is noncompliant and has been using substances, not sure how much they could offer    History of Rheumatic Valvular Heart Disease  - Most recent echocardiogram in 03/2018 showed mild-moderate aortic valve regurgitation, mild mitral valve regurgitation, moderate tricuspid valve regurgitation  - Echo this admission with severe dilation of LA and RA, moderate to severe MVR, moderate to severe AI  - Her valvular heart disease will need to be addressed post cath    Polysubstance Abuse - Continues to use cocaine and smoke cigarettes  - Patient has been counseled on the importance of stopping these substances, and their negative effects on heart health    Elevated trop  - hsTn 46>>43 - Minimal elevation with flat trend-- not consistent with Acs, suspect secondary to demand in the setting of heart failure exacerbation  - She does have multiple cardiac risk factors for CAD and therefore I do think with the drop in EF that ischemic work-up with cardiac cath would be appropriate -Plan for right and left heart catheterization today  Otherwise Per primary  - Type 2 DM  - Transaminitis  - Hypoalbuminemia  - COPD  I have spent a total of 35 minutes with patient reviewing 2D echo , telemetry, EKGs, labs and examining patient as well as establishing an assessment  and plan that was discussed with the patient.  > 50% of time was spent in direct patient care.     For questions or updates, please contact [place] Please consult [email or web address] for contact info under    Signed, Fransico Him, MD  04/11/2022, 9:47 AM

## 2022-04-11 NOTE — Consult Note (Signed)
Please see full note from Dr. Serafina Mitchell.   Patient seen for reassessment, in her room with her daughter present at bedside.  She is alert and oriented, calm and cooperative. She is pleasant and engages well with this psychiatric nurse practitioner.  She appears well currently with a benign examination. Currently denies SI/HI and states her symptoms were exacerbated by "a lot of guilt and resentment towards self". We discussed the need to move forward and make changes were she can in which she agrees. Her daughter is there for support as well, and encourages her to "let go of the past".   Her speech is clear and she is ambulating without concern.  Given this, will psych clear patient, provided outpatient resources and instructions to follow up with [facility]  in 1-2 weeks for therapy. She is understanding of plan. Continues to adamantly deny SI.   Will psychiatrically clear. Psychiatry to sign off at this time. Please re consult if her mood changes, endorses suicidal ideation,or intent to hurt other.  TOC consult for referral to [facility].

## 2022-04-12 ENCOUNTER — Encounter (HOSPITAL_COMMUNITY): Payer: Self-pay | Admitting: Interventional Cardiology

## 2022-04-12 ENCOUNTER — Inpatient Hospital Stay (HOSPITAL_COMMUNITY): Payer: Medicare Other

## 2022-04-12 DIAGNOSIS — R778 Other specified abnormalities of plasma proteins: Secondary | ICD-10-CM | POA: Diagnosis not present

## 2022-04-12 DIAGNOSIS — I10 Essential (primary) hypertension: Secondary | ICD-10-CM | POA: Diagnosis not present

## 2022-04-12 DIAGNOSIS — I5043 Acute on chronic combined systolic (congestive) and diastolic (congestive) heart failure: Secondary | ICD-10-CM | POA: Diagnosis not present

## 2022-04-12 DIAGNOSIS — R7989 Other specified abnormal findings of blood chemistry: Secondary | ICD-10-CM | POA: Diagnosis not present

## 2022-04-12 LAB — CBC WITH DIFFERENTIAL/PLATELET
Abs Immature Granulocytes: 0.04 10*3/uL (ref 0.00–0.07)
Basophils Absolute: 0.1 10*3/uL (ref 0.0–0.1)
Basophils Relative: 1 %
Eosinophils Absolute: 0.1 10*3/uL (ref 0.0–0.5)
Eosinophils Relative: 1 %
HCT: 45.1 % (ref 36.0–46.0)
Hemoglobin: 14.5 g/dL (ref 12.0–15.0)
Immature Granulocytes: 1 %
Lymphocytes Relative: 28 %
Lymphs Abs: 2.4 10*3/uL (ref 0.7–4.0)
MCH: 31.2 pg (ref 26.0–34.0)
MCHC: 32.2 g/dL (ref 30.0–36.0)
MCV: 97 fL (ref 80.0–100.0)
Monocytes Absolute: 0.8 10*3/uL (ref 0.1–1.0)
Monocytes Relative: 9 %
Neutro Abs: 5.3 10*3/uL (ref 1.7–7.7)
Neutrophils Relative %: 60 %
Platelets: 304 10*3/uL (ref 150–400)
RBC: 4.65 MIL/uL (ref 3.87–5.11)
RDW: 13.2 % (ref 11.5–15.5)
WBC: 8.7 10*3/uL (ref 4.0–10.5)
nRBC: 0 % (ref 0.0–0.2)

## 2022-04-12 LAB — GLUCOSE, CAPILLARY
Glucose-Capillary: 111 mg/dL — ABNORMAL HIGH (ref 70–99)
Glucose-Capillary: 145 mg/dL — ABNORMAL HIGH (ref 70–99)
Glucose-Capillary: 155 mg/dL — ABNORMAL HIGH (ref 70–99)
Glucose-Capillary: 174 mg/dL — ABNORMAL HIGH (ref 70–99)
Glucose-Capillary: 198 mg/dL — ABNORMAL HIGH (ref 70–99)

## 2022-04-12 LAB — COMPREHENSIVE METABOLIC PANEL
ALT: 41 U/L (ref 0–44)
AST: 28 U/L (ref 15–41)
Albumin: 2.6 g/dL — ABNORMAL LOW (ref 3.5–5.0)
Alkaline Phosphatase: 70 U/L (ref 38–126)
Anion gap: 10 (ref 5–15)
BUN: 28 mg/dL — ABNORMAL HIGH (ref 6–20)
CO2: 25 mmol/L (ref 22–32)
Calcium: 9 mg/dL (ref 8.9–10.3)
Chloride: 102 mmol/L (ref 98–111)
Creatinine, Ser: 1.23 mg/dL — ABNORMAL HIGH (ref 0.44–1.00)
GFR, Estimated: 51 mL/min — ABNORMAL LOW (ref >60)
Glucose, Bld: 204 mg/dL — ABNORMAL HIGH (ref 70–99)
Potassium: 5 mmol/L (ref 3.5–5.1)
Sodium: 137 mmol/L (ref 135–145)
Total Bilirubin: 0.7 mg/dL (ref 0.3–1.2)
Total Protein: 5 g/dL — ABNORMAL LOW (ref 6.5–8.1)

## 2022-04-12 MED ORDER — SPIRONOLACTONE 25 MG PO TABS
25.0000 mg | ORAL_TABLET | Freq: Every day | ORAL | Status: DC
  Administered 2022-04-12 – 2022-04-13 (×2): 25 mg via ORAL
  Filled 2022-04-12 (×2): qty 1

## 2022-04-12 MED ORDER — FUROSEMIDE 40 MG PO TABS
40.0000 mg | ORAL_TABLET | Freq: Two times a day (BID) | ORAL | Status: DC
  Administered 2022-04-13: 40 mg via ORAL
  Filled 2022-04-12: qty 1

## 2022-04-12 MED ORDER — LOSARTAN POTASSIUM 25 MG PO TABS
25.0000 mg | ORAL_TABLET | Freq: Every day | ORAL | Status: DC
  Administered 2022-04-12 – 2022-04-13 (×2): 25 mg via ORAL
  Filled 2022-04-12 (×2): qty 1

## 2022-04-12 MED ORDER — FUROSEMIDE 10 MG/ML IJ SOLN
40.0000 mg | Freq: Once | INTRAMUSCULAR | Status: AC
  Administered 2022-04-12: 40 mg via INTRAVENOUS
  Filled 2022-04-12: qty 4

## 2022-04-12 MED ORDER — IOHEXOL 350 MG/ML SOLN
50.0000 mL | Freq: Once | INTRAVENOUS | Status: AC | PRN
Start: 2022-04-12 — End: 2022-04-12
  Administered 2022-04-12: 50 mL via INTRAVENOUS

## 2022-04-12 NOTE — Plan of Care (Signed)
  Problem: Education: Goal: Ability to demonstrate management of disease process will improve Outcome: Adequate for Discharge   Problem: Education: Goal: Ability to verbalize understanding of medication therapies will improve Outcome: Adequate for Discharge   

## 2022-04-12 NOTE — Progress Notes (Signed)
Physical Therapy Treatment Patient Details Name: Stacey Lin MRN: [id number] DOB: [birth year] Today's Date: 04/12/2022   History of Present Illness Pt adm 6/14 with acute on chronic heart failure. PMH - chf, DM, HTN, copd, cocaine use.    PT Comments    Pt with continued progress this session. She continues to demonstrate mildly unsteady gait with stability improvement with RW use; encouraged pt to utilize RW at home for increased OOB mobility as pt stating she stays in bed most of the day. Unable to progress stair negotiation as unit under fire alarm precautions. Educated pt re; daily weights, low sodium diet, activity recommendations and importance of continued mobility, pt verbalizing understanding. Pt continues to benefit from skilled PT services to progress toward functional mobility goals.    Recommendations for follow up therapy are one component of a multi-disciplinary discharge planning process, led by the attending physician.  Recommendations may be updated based on patient status, additional functional criteria and insurance authorization.  Follow Up Recommendations  No PT follow up     Assistance Recommended at Discharge Intermittent Supervision/Assistance  Patient can return home with the following Assistance with cooking/housework   Equipment Recommendations  None recommended by PT    Recommendations for Other Services       Precautions / Restrictions Precautions Precautions: Fall Restrictions Weight Bearing Restrictions: No     Mobility  Bed Mobility               General bed mobility comments: pt in BR on commode on arrival    Transfers Overall transfer level: Modified independent Equipment used: None, Rolling walker (2 wheels)               General transfer comment: steady rise, cues for hand placement    Ambulation/Gait Ambulation/Gait assistance: Min guard Gait Distance (Feet): 90 Feet (60+30) Assistive device: None, Rolling walker  (2 wheels) Gait Pattern/deviations: Step-through pattern, Decreased stride length, Trunk flexed Gait velocity: decr     General Gait Details: Assist for safety with pt unsteady but no overt loss of balance. Pt fatigues quickly and leans forward to prop forearms on walker to rest   Stairs             Wheelchair Mobility    Modified Rankin (Stroke Patients Only)       Balance Overall balance assessment: Needs assistance Sitting-balance support: No upper extremity supported, Feet supported Sitting balance-Leahy Scale: Normal     Standing balance support: No upper extremity supported, During functional activity Standing balance-Leahy Scale: Good                              Cognition Arousal/Alertness: Awake/alert Behavior During Therapy: WFL for tasks assessed/performed Overall Cognitive Status: Within Functional Limits for tasks assessed                                          Exercises      General Comments General comments (skin integrity, edema, etc.): SpO2 95-100% on RA with amb. Dyspnea 3/4.      Pertinent Vitals/Pain      Home Living                          Prior Function            PT Goals (  current goals can now be found in the care plan section) Acute Rehab PT Goals Patient Stated Goal: go home PT Goal Formulation: With patient Time For Goal Achievement: 04/16/22    Frequency    Min 2X/week      PT Plan      Co-evaluation              AM-PAC PT "6 Clicks" Mobility   Outcome Measure  Help needed turning from your back to your side while in a flat bed without using bedrails?: None Help needed moving from lying on your back to sitting on the side of a flat bed without using bedrails?: None Help needed moving to and from a bed to a chair (including a wheelchair)?: A Little Help needed standing up from a chair using your arms (e.g., wheelchair or bedside chair)?: None Help needed to walk  in hospital room?: A Little Help needed climbing 3-5 steps with a railing? : A Little 6 Click Score: 21    End of Session   Activity Tolerance: Patient limited by fatigue Patient left: in bed;with call bell/phone within reach Nurse Communication: Mobility status PT Visit Diagnosis: Other abnormalities of gait and mobility (R26.89);Muscle weakness (generalized) (M62.81)     Time: 1610-9604 PT Time Calculation (min) (ACUTE ONLY): 16 min  Charges:  $Therapeutic Activity: 8-22 mins                     Billee Balcerzak R. PTA Acute Rehabilitation Services Office: [phone]   Catalina Antigua 04/12/2022, 10:10 AM

## 2022-04-12 NOTE — Care Management Important Message (Signed)
Important Message  Patient Details  Name: Stacey Lin MRN: [id number] Date of Birth: [birth year]   Medicare Important Message Given:  Yes     Shelda Altes 04/12/2022, 9:03 AM

## 2022-04-12 NOTE — Progress Notes (Signed)
PROGRESS NOTE    Stacey Lin  WNU:[id number] DOB: 1966/08/25 DOA: 04/06/2022 PCP: Grayce Sessions, NP  Chief Complaint  Patient presents with   Shortness of Breath    Brief Narrative:  Stacey Lin is Stacey Lin 56 y.o. female with medical history significant of hypertension, hyperlipidemia, congestive heart failure, diabetes mellitus type 2, COPD, and prior tobacco abuse who presents with complaints of shortness of breath progressively worsening over the last 2 weeks.    She was admitted with heart failure exacerbation.  Cardiology was consulted.  Echo showed decreased EF to 30-35% as well as  moderate to severe TVR, AVR, and moderate to severe MVR.  Now s/p catheterization without angiographically apparent CAD.    Cards following, recommending continued IV lasix today.  Planning for CT PE protocol as well given her continued SOB.  See below for additional details    Assessment & Plan:   Principal Problem:   Acute exacerbation of CHF (congestive heart failure) (HCC) Active Problems:   Acute respiratory failure with hypoxia (HCC)   Elevated troponin   COPD without exacerbation (HCC)   Acute metabolic encephalopathy   Essential hypertension, benign   Controlled type 2 diabetes mellitus with hyperglycemia (HCC)   Transaminitis   Hypoalbuminemia   Polysubstance abuse (HCC)   Headache   Elevated LFTs   Obesity (BMI 30-39.9)   Depressed mood   Nonrheumatic aortic valve insufficiency   Assessment and Plan: * Acute exacerbation of CHF (congestive heart failure) (HCC) Patient presents with complaints of progressively worsening lower extremity swelling and shortness of breath over the last 2 weeks.   CXR 6/15 without acute findings Initially required bipap Improved edema today BNP was elevated at 1508.2 echocardiogram from 2019 noted EF of 55-65% with grade 1 diastolic dysfunction Repeat echo with EF 30-35%, grade 1 diastolic dysfunction, moderate to severe MVR, moderate to  severe TVR, moderate to severe AVR (see report) LVEF <25% by visual estimate on cath Diuresis per cardiology Coreg, farxiga, spironolactone (spiro mentioned in cards note, but not ordered, will follow) Appreciate cardiology recs - planning to resume GDMT as tolerated.   Cath without angiographically apparent CAD, well controlled R heart pressures.  Recommended continuing medical therapy for NICM.  Acute respiratory failure with hypoxia (HCC) Currently on RA CXR without acute findings, though exam at presentation concerning for HF Mild wheezing at times - improved, continue breo/incruse, give albuterol and follow - I don't think she needs steroids at this time Given persistent SOB, getting additional IV lasix today per cards Will get CT PE protocol as well  Elevated troponin CP seems msk vs pleuritic/related to cough Elevated, but flat Appreciate cards recs, low suspicion for acs   Acute metabolic encephalopathy Called to bedside due to encephalopathy on 6/16 She was coming around by the time I got there, but was described as somnolent in cards notes VBG without hypercarbia tox screen remain positive only for cocaine Unclear cause of her encephalopathy, she got ativan late night prior? Possible contributor?  I don't see other possibly contributing meds.   Delirium precautions Narcan prn  Follow closely - seems overall improved    COPD without exacerbation (HCC) Continue home meds Improved wheezing S/p solumedrol in ED, I don't think she needs steroids, but will continue nebs for now  Essential hypertension, benign Coreg Follow for adjustments  Controlled type 2 diabetes mellitus with hyperglycemia (HCC) a1c 10/2021 6.2 Repeat SSI Started on farxiga  Transaminitis Related to HF? Cocaine use? Negative acute hepatitis panel trend  Hypoalbuminemia Follow RD  Polysubstance abuse (HCC) utox positive for cocaine, encourage cessation  Headache resolved  Depressed  mood Will ask psych to see, sounds like lots of social stressors She wasn't initially able to say whether she was having any SI Addendum: evaluated later in afternoon (6/16), this time, she clearly states no desire to hurt herself or end her life.  She's still open to psychiatry seeing her. Psych recommending lexapro  Obesity (BMI 30-39.9) noted      DVT prophylaxis: lovenox Code Status: full Family Communication: none Disposition:   Status is: Inpatient Remains inpatient appropriate because: need for continued therapy   Consultants:  Cardiology Psych  Procedures:    There is severe left ventricular systolic dysfunction.   LV end diastolic pressure is normal.   The left ventricular ejection fraction is less than 25% by visual estimate.   Hemodynamic findings consistent with mild pulmonary hypertension.   There is no aortic valve stenosis.   Aortic saturation 97%, PA saturation 64%, PA pressure 36/19, mean PA pressure 27 mmHg, mean pulmonary capillary wedge pressure 14 mmHg, cardiac output 3.28 L/min, cardiac index 1.96.   No angiographically apparent coronary artery disease.  Fairly well controlled right heart pressures as well.   Continue medical therapy for nonischemic cardiomyopathy.  I stressed the importance of medication compliance.   Echo IMPRESSIONS     1. Significant change in LVEF was 55-60% in 2019. Left ventricular  ejection fraction, by estimation, is 30 to 35%. The left ventricle has  moderately decreased function. The left ventricle demonstrates global  hypokinesis. The left ventricular internal  cavity size was moderately dilated. Left ventricular diastolic parameters  are consistent with Grade I diastolic dysfunction (impaired relaxation).   2. Right ventricular systolic function is normal. The right ventricular  size is normal. There is mildly elevated pulmonary artery systolic  pressure.   3. Left atrial size was severely dilated.   4. Right  atrial size was severely dilated.   5. The mitral valve is normal in structure. Moderate to severe mitral  valve regurgitation. Mild mitral stenosis. The mean mitral valve gradient  is 8.5 mmHg.   6. Tricuspid valve regurgitation is moderate to severe.   7. The aortic valve is normal in structure. Aortic valve regurgitation is  moderate to severe. No aortic stenosis is present.   8. The inferior vena cava is normal in size with greater than 50%  respiratory variability, suggesting right atrial pressure of 3 mmHg.   Comparison(s): Geramy Lamorte prior study was performed on 04/10/2018. The left  ventricular function is significantly worse. Aortic regugitation, mitral  regurgitation and tricuspid regurgitation have worsened.   Antimicrobials:  Anti-infectives (From admission, onward)    None       Subjective: C/o continued SOB  Objective: Vitals:   04/12/22 0440 04/12/22 0729 04/12/22 1137 04/12/22 1616  BP: 117/78 127/81 102/69 101/71  Pulse: 86 82 77 79  Resp: 20 17 18 16   Temp: 98.6 F (37 C) 98 F (36.7 C) 98.7 F (37.1 C) 98.5 F (36.9 C)  TempSrc: Oral Oral Oral Oral  SpO2: 94% 90% 98% 99%  Weight: 70.1 kg     Height:        Intake/Output Summary (Last 24 hours) at 04/12/2022 1929 Last data filed at 04/12/2022 1744 Gross per 24 hour  Intake 1080 ml  Output 3200 ml  Net -2120 ml   Filed Weights   04/10/22 0428 04/11/22 0128 04/12/22 0440  Weight: 70.6 kg 69.7 kg  70.1 kg    Examination:  General exam: Appears anxious at times Respiratory system: tachypneic, seems like she gets anxious when discussing SOB Cardiovascular system: RRR Gastrointestinal system: Abdomen is nondistended, soft and nontender. Central nervous system: Alert and oriented. No focal neurological deficits. Extremities: no LEE  Data Reviewed: I have personally reviewed following labs and imaging studies  CBC: Recent Labs  Lab 04/08/22 0341 04/09/22 0208 04/10/22 0300 04/11/22 0420  04/11/22 1356 04/11/22 1403 04/12/22 0522  WBC 11.0* 9.7 9.2 9.7  --   --  8.7  NEUTROABS 6.5 6.2 6.1 6.0  --   --  5.3  HGB 13.9 13.7 14.5 15.1* 15.3* 15.3*  15.3* 14.5  HCT 45.0 44.2 45.1 46.8* 45.0 45.0  45.0 45.1  MCV 100.2* 97.1 96.8 95.7  --   --  97.0  PLT 257 265 271 308  --   --  304    Basic Metabolic Panel: Recent Labs  Lab 04/06/22 0820 04/07/22 0420 04/08/22 0341 04/09/22 0208 04/10/22 0300 04/11/22 0420 04/11/22 1356 04/11/22 1403 04/12/22 0522  NA 139  140   < > 140 139 136 136 137 138  137 137  K 4.1  4.1   < > 3.9 3.8 4.3 5.0 4.8 4.7  4.6 5.0  CL 108   < > 108 99 98 99  --   --  102  CO2  --    < > 21* 28 27 30   --   --  25  GLUCOSE 285*   < > 100* 105* 236* 145*  --   --  204*  BUN 23*   < > 30* 32* 34* 31*  --   --  28*  CREATININE 0.90   < > 1.04* 1.07* 1.20* 1.28*  --   --  1.23*  CALCIUM  --    < > 8.5* 8.3* 8.4* 8.9  --   --  9.0  MG 1.9  --  2.1 2.4 2.3 2.5*  --   --   --   PHOS  --   --  4.7* 4.1 5.0* 4.9*  --   --   --    < > = values in this interval not displayed.    GFR: Estimated Creatinine Clearance: 43.5 mL/min (Roe Wilner) (by C-G formula based on SCr of 1.23 mg/dL (H)).  Liver Function Tests: Recent Labs  Lab 04/08/22 0341 04/09/22 0208 04/10/22 0300 04/11/22 0420 04/12/22 0522  AST 44* 31 28 22 28   ALT 64* 59* 48* 44 41  ALKPHOS 82 87 84 76 70  BILITOT 0.6 0.7 1.1 0.7 0.7  PROT 4.4* 4.7* 4.9* 5.3* 5.0*  ALBUMIN 2.4* 2.5* 2.5* 2.5* 2.6*    CBG: Recent Labs  Lab 04/11/22 2153 04/12/22 0049 04/12/22 0634 04/12/22 1133 04/12/22 1614  GLUCAP 207* 155* 198* 145* 111*     Recent Results (from the past 240 hour(s))  Resp Panel by RT-PCR (Flu Tanga Gloor&B, Covid) Anterior Nasal Swab     Status: None   Collection Time: 04/06/22  5:17 PM   Specimen: Anterior Nasal Swab  Result Value Ref Range Status   SARS Coronavirus 2 by RT PCR NEGATIVE NEGATIVE Final    Comment: (NOTE) SARS-CoV-2 target nucleic acids are NOT DETECTED.  The  SARS-CoV-2 RNA is generally detectable in upper respiratory specimens during the acute phase of infection. The lowest concentration of SARS-CoV-2 viral copies this assay can detect is 138 copies/mL. Jonessa Triplett negative result does not preclude SARS-Cov-2 infection and should  not be used as the sole basis for treatment or other patient management decisions. Daiel Strohecker negative result may occur with  improper specimen collection/handling, submission of specimen other than nasopharyngeal swab, presence of viral mutation(s) within the areas targeted by this assay, and inadequate number of viral copies(<138 copies/mL). Herberto Ledwell negative result must be combined with clinical observations, patient history, and epidemiological information. The expected result is Negative.  Fact Sheet for Patients:  BloggerCourse.com  Fact Sheet for Healthcare Providers:  SeriousBroker.it  This test is no t yet approved or cleared by the [place] FDA and  has been authorized for detection and/or diagnosis of SARS-CoV-2 by FDA under an Emergency Use Authorization (EUA). This EUA will remain  in effect (meaning this test can be used) for the duration of the COVID-19 declaration under Section 564(b)(1) of the Act, 21 U.S.C.section 360bbb-3(b)(1), unless the authorization is terminated  or revoked sooner.       Influenza Indira Sorenson by PCR NEGATIVE NEGATIVE Final   Influenza B by PCR NEGATIVE NEGATIVE Final    Comment: (NOTE) The Xpert Xpress SARS-CoV-2/FLU/RSV plus assay is intended as an aid in the diagnosis of influenza from Nasopharyngeal swab specimens and should not be used as Saad Buhl sole basis for treatment. Nasal washings and aspirates are unacceptable for Xpert Xpress SARS-CoV-2/FLU/RSV testing.  Fact Sheet for Patients: BloggerCourse.com  Fact Sheet for Healthcare Providers: SeriousBroker.it  This test is not yet approved or  cleared by the [place] FDA and has been authorized for detection and/or diagnosis of SARS-CoV-2 by FDA under an Emergency Use Authorization (EUA). This EUA will remain in effect (meaning this test can be used) for the duration of the COVID-19 declaration under Section 564(b)(1) of the Act, 21 U.S.C. section 360bbb-3(b)(1), unless the authorization is terminated or revoked.  Performed at [facility], [address] [place], [place] [address]          Radiology Studies: CARDIAC CATHETERIZATION  Result Date: 04/11/2022   There is severe left ventricular systolic dysfunction.   LV end diastolic pressure is normal.   The left ventricular ejection fraction is less than 25% by visual estimate.   Hemodynamic findings consistent with mild pulmonary hypertension.   There is no aortic valve stenosis.   Aortic saturation 97%, PA saturation 64%, PA pressure 36/19, mean PA pressure 27 mmHg, mean pulmonary capillary wedge pressure 14 mmHg, cardiac output 3.28 L/min, cardiac index 1.96. No angiographically apparent coronary artery disease.  Fairly well controlled right heart pressures as well. Continue medical therapy for nonischemic cardiomyopathy.  I stressed the importance of medication compliance.        Scheduled Meds:  carvedilol  3.125 mg Oral BID WC   dapagliflozin propanediol  10 mg Oral Daily   enoxaparin (LOVENOX) injection  40 mg Subcutaneous Daily   escitalopram  20 mg Oral Daily   feeding supplement (GLUCERNA SHAKE)  237 mL Oral TID BM   fluticasone furoate-vilanterol  1 puff Inhalation Daily   And   umeclidinium bromide  1 puff Inhalation Daily   [START ON 04/13/2022] furosemide  40 mg Oral BID   insulin aspart  0-5 Units Subcutaneous QHS   insulin aspart  0-9 Units Subcutaneous TID WC   losartan  25 mg Oral Daily   multivitamin with minerals  1 tablet Oral Daily   sodium chloride flush  3 mL Intravenous Q12H   sodium chloride flush  3 mL Intravenous Q12H    sodium chloride flush  3 mL Intravenous Q12H  spironolactone  25 mg Oral Daily   Continuous Infusions:  sodium chloride       LOS: 6 days    Time spent: over 30 min    Lacretia Nicks, MD [facility]   To contact the attending provider between 7A-7P or the covering provider during after hours 7P-7A, please log into the web site [email or web address] and access using universal [place] password for that web site. If you do not have the password, please call the hospital operator.  04/12/2022, 7:29 PM

## 2022-04-12 NOTE — Plan of Care (Signed)
?  Problem: Activity: ?Goal: Capacity to carry out activities will improve ?Outcome: Adequate for Discharge ?  ?Problem: Cardiac: ?Goal: Ability to achieve and maintain adequate cardiopulmonary perfusion will improve ?Outcome: Adequate for Discharge ?  ?

## 2022-04-12 NOTE — Progress Notes (Signed)
Progress Note  Patient Name: Stacey Lin Date of Encounter: 04/12/2022  [facility] Cardiologist: None   Subjective   Doing well.  Denies any chest pain or SOB.  Cath yesterday with severe LV dysfunction, EF <25%, mild pulmonary HTN, CO 3.28, CI 1.96, normal coronary arteries.   Inpatient Medications    Scheduled Meds:  carvedilol  3.125 mg Oral BID WC   dapagliflozin propanediol  10 mg Oral Daily   enoxaparin (LOVENOX) injection  40 mg Subcutaneous Daily   escitalopram  20 mg Oral Daily   feeding supplement (GLUCERNA SHAKE)  237 mL Oral TID BM   fluticasone furoate-vilanterol  1 puff Inhalation Daily   And   umeclidinium bromide  1 puff Inhalation Daily   insulin aspart  0-5 Units Subcutaneous QHS   insulin aspart  0-9 Units Subcutaneous TID WC   multivitamin with minerals  1 tablet Oral Daily   sodium chloride flush  3 mL Intravenous Q12H   sodium chloride flush  3 mL Intravenous Q12H   sodium chloride flush  3 mL Intravenous Q12H   Continuous Infusions:  sodium chloride     PRN Meds: sodium chloride, acetaminophen, albuterol, hydrALAZINE, LORazepam, naLOXone (NARCAN)  injection, ondansetron (ZOFRAN) IV, sodium chloride flush   Vital Signs    Vitals:   04/11/22 2156 04/12/22 0046 04/12/22 0440 04/12/22 0729  BP: (!) 129/59 118/68 117/78 127/81  Pulse: 82 82 86 82  Resp: '18 18 20 17  '$ Temp: 98.6 F (37 C) 98.6 F (37 C) 98.6 F (37 C) 98 F (36.7 C)  TempSrc: Oral Oral Oral Oral  SpO2: 98% 94% 94% 90%  Weight:   70.1 kg   Height:        Intake/Output Summary (Last 24 hours) at 04/12/2022 0951 Last data filed at 04/12/2022 0443 Gross per 24 hour  Intake 720 ml  Output 400 ml  Net 320 ml       04/12/2022    4:40 AM 04/11/2022    1:28 AM 04/10/2022    4:28 AM  Last 3 Weights  Weight (lbs) 154 lb 8 oz 153 lb 11.2 oz 155 lb 11.2 oz  Weight (kg) 70.081 kg 69.718 kg 70.625 kg      Telemetry    NSR - personally Reviewed  ECG    No new EKG to  review- Personally Reviewed  Physical Exam   GEN: Well nourished, well developed in no acute distress HEENT: Normal NECK: No JVD; No carotid bruits LYMPHATICS: No lymphadenopathy CARDIAC:RRR, no murmurs, rubs, gallops RESPIRATORY:  Clear to auscultation without rales, wheezing or rhonchi  ABDOMEN: Soft, non-tender, non-distended MUSCULOSKELETAL:  No edema; No deformity  SKIN: Warm and dry NEUROLOGIC:  Alert and oriented x 3 PSYCHIATRIC:  Normal affect   Labs    High Sensitivity Troponin:   Recent Labs  Lab 04/06/22 0740 04/06/22 0945  TROPONINIHS 46* 43*      Chemistry Recent Labs  Lab 04/09/22 0208 04/10/22 0300 04/11/22 0420 04/11/22 1356 04/11/22 1403 04/12/22 0522  NA 139 136 136 137 138  137 137  K 3.8 4.3 5.0 4.8 4.7  4.6 5.0  CL 99 98 99  --   --  102  CO2 '28 27 30  '$ --   --  25  GLUCOSE 105* 236* 145*  --   --  204*  BUN 32* 34* 31*  --   --  28*  CREATININE 1.07* 1.20* 1.28*  --   --  1.23*  CALCIUM  8.3* 8.4* 8.9  --   --  9.0  MG 2.4 2.3 2.5*  --   --   --   PROT 4.7* 4.9* 5.3*  --   --  5.0*  ALBUMIN 2.5* 2.5* 2.5*  --   --  2.6*  AST '31 28 22  '$ --   --  28  ALT 59* 48* 44  --   --  41  ALKPHOS 87 84 76  --   --  70  BILITOT 0.7 1.1 0.7  --   --  0.7  GFRNONAA >60 52* 49*  --   --  51*  ANIONGAP '12 11 7  '$ --   --  10     Lipids No results for input(s): "CHOL", "TRIG", "HDL", "LABVLDL", "LDLCALC", "CHOLHDL" in the last 168 hours.  Hematology Recent Labs  Lab 04/10/22 0300 04/11/22 0420 04/11/22 1356 04/11/22 1403 04/12/22 0522  WBC 9.2 9.7  --   --  8.7  RBC 4.66 4.89  --   --  4.65  HGB 14.5 15.1* 15.3* 15.3*  15.3* 14.5  HCT 45.1 46.8* 45.0 45.0  45.0 45.1  MCV 96.8 95.7  --   --  97.0  MCH 31.1 30.9  --   --  31.2  MCHC 32.2 32.3  --   --  32.2  RDW 13.2 13.1  --   --  13.2  PLT 271 308  --   --  304    Thyroid  Recent Labs  Lab 04/06/22 1745  TSH 0.459     BNP Recent Labs  Lab 04/06/22 0740  BNP 1,508.2*     DDimer   Recent Labs  Lab 04/06/22 0820  DDIMER 0.56*      Radiology    CARDIAC CATHETERIZATION  Result Date: 04/11/2022   There is severe left ventricular systolic dysfunction.   LV end diastolic pressure is normal.   The left ventricular ejection fraction is less than 25% by visual estimate.   Hemodynamic findings consistent with mild pulmonary hypertension.   There is no aortic valve stenosis.   Aortic saturation 97%, PA saturation 64%, PA pressure 36/19, mean PA pressure 27 mmHg, mean pulmonary capillary wedge pressure 14 mmHg, cardiac output 3.28 L/min, cardiac index 1.96. No angiographically apparent coronary artery disease.  Fairly well controlled right heart pressures as well. Continue medical therapy for nonischemic cardiomyopathy.  I stressed the importance of medication compliance.    Cardiac Studies   Echocardiogram 04/07/22 1. Significant change in LVEF was 55-60% in 2019. Left ventricular  ejection fraction, by estimation, is 30 to 35%. The left ventricle has  moderately decreased function. The left ventricle demonstrates global  hypokinesis. The left ventricular internal  cavity size was moderately dilated. Left ventricular diastolic parameters  are consistent with Grade I diastolic dysfunction (impaired relaxation).   2. Right ventricular systolic function is normal. The right ventricular  size is normal. There is mildly elevated pulmonary artery systolic  pressure.   3. Left atrial size was severely dilated.   4. Right atrial size was severely dilated.   5. The mitral valve is normal in structure. Moderate to severe mitral  valve regurgitation. Mild mitral stenosis. The mean mitral valve gradient  is 8.5 mmHg.   6. Tricuspid valve regurgitation is moderate to severe.   7. The aortic valve is normal in structure. Aortic valve regurgitation is  moderate to severe. No aortic stenosis is present.   8. The inferior vena cava is normal in  size with greater than 50%  respiratory  variability, suggesting right atrial pressure of 3 mmHg.   Comparison(s): A prior study was performed on 04/10/2018. The left  ventricular function is significantly worse. Aortic regugitation, mitral  regurgitation and tricuspid regurgitation have worsened.   Patient Profile     56 y.o. female with a history of heart failure, HLD, HTN, mitral valve regurgitation, polysubstance abuse, asthma, anemia who presented with SOB, was seen 6/14 for evaluation of decompensated heart failure  Assessment & Plan    Altered Level of Consciousness  -Urine drug screen was positive for cocaine -Psych consult in progress  Acute decompensated systolic heart failure  - Patient previously had a reduced EF of 20-25% in 03/2016. Echo in 03/2017 showed that EF had improved to 60-65% with grade I diastolic dysfunction  - Echo this admission showed EF 11-15%, grade I diastolic dysfunction, normal RV systolic function, severe dilation of LA and RA, moderate to severe MVR, moderate to severe  TVR, moderate to severe AI  - Suspect reduced EF is in part due to substance abuse and noncompliance with home medications.  - Patient has been educated on the importance of medication compliance, stopping substance abuse.  - cath yesterday with no CAD and mild PHTN.  CO 3.28 and CI 1.96, mRAP 94mHg, PAP 36/19 with mean PAP 278mg, PCWP 1456m and LVEDP 53m70m   - serum creatinine stable (1.07->>1.2->> 1.28 ->>1.23 today) - I&O's incomplete yesterday but is net - 7.6 L since admission - weight down 7lbs from admit - still SOB so will give 1 more dose of Lasix '40mg'$  IV and then change to lasix '40mg'$  BID (home dose) - Continue farxiga 10 mg daily and carvedilol 3.125 mg twice daily - restart home dose of Losartan '25mg'$  daily and spiro '25mg'$  daily - did not tolerate Entresto due to cough in the past   History of Rheumatic Valvular Heart Disease  - Most recent echocardiogram in 03/2018 showed mild-moderate aortic valve regurgitation,  mild mitral valve regurgitation, moderate tricuspid valve regurgitation  - Echo this admission with severe dilation of LA and RA, moderate to severe MVR, moderate to severe AI  - Her valvular heart disease will need to be addressed post cath    Polysubstance Abuse - Continues to use cocaine and smoke cigarettes  - Patient has been counseled on the importance of stopping these substances, and their negative effects on heart health    Elevated trop  - hsTn 46>>43 - Minimal elevation with flat trend-- not consistent with Acs, suspect secondary to demand in the setting of heart failure exacerbation  - cath with no CAD  Otherwise Per primary  - Type 2 DM  - Transaminitis  - Hypoalbuminemia  - COPD  I have spent a total of 35 minutes with patient reviewing 2D echo , telemetry, EKGs, labs and examining patient as well as establishing an assessment and plan that was discussed with the patient.  > 50% of time was spent in direct patient care.     For questions or updates, please contact [facility] consult [email or web address] for contact info under    Signed, TracFransico Him  04/12/2022, 9:51 AM

## 2022-04-12 NOTE — Progress Notes (Signed)
Patient states there's a pharmacy service called '[facility]' and the service prepares meds, packages based on dosage, and delivers medication. She says this will help her take her medicine without forgetting. Patient also states she will probably need home oxygen. Please advise as needed.

## 2022-04-13 ENCOUNTER — Other Ambulatory Visit (HOSPITAL_COMMUNITY): Payer: Self-pay

## 2022-04-13 DIAGNOSIS — I5043 Acute on chronic combined systolic (congestive) and diastolic (congestive) heart failure: Secondary | ICD-10-CM | POA: Diagnosis not present

## 2022-04-13 DIAGNOSIS — R778 Other specified abnormalities of plasma proteins: Secondary | ICD-10-CM | POA: Diagnosis not present

## 2022-04-13 DIAGNOSIS — I10 Essential (primary) hypertension: Secondary | ICD-10-CM | POA: Diagnosis not present

## 2022-04-13 DIAGNOSIS — R7989 Other specified abnormal findings of blood chemistry: Secondary | ICD-10-CM | POA: Diagnosis not present

## 2022-04-13 LAB — CBC WITH DIFFERENTIAL/PLATELET
Abs Immature Granulocytes: 0.07 10*3/uL (ref 0.00–0.07)
Basophils Absolute: 0 10*3/uL (ref 0.0–0.1)
Basophils Relative: 0 %
Eosinophils Absolute: 0.2 10*3/uL (ref 0.0–0.5)
Eosinophils Relative: 2 %
HCT: 46.5 % — ABNORMAL HIGH (ref 36.0–46.0)
Hemoglobin: 15.1 g/dL — ABNORMAL HIGH (ref 12.0–15.0)
Immature Granulocytes: 1 %
Lymphocytes Relative: 31 %
Lymphs Abs: 3 10*3/uL (ref 0.7–4.0)
MCH: 31 pg (ref 26.0–34.0)
MCHC: 32.5 g/dL (ref 30.0–36.0)
MCV: 95.5 fL (ref 80.0–100.0)
Monocytes Absolute: 1 10*3/uL (ref 0.1–1.0)
Monocytes Relative: 11 %
Neutro Abs: 5.3 10*3/uL (ref 1.7–7.7)
Neutrophils Relative %: 55 %
Platelets: 292 10*3/uL (ref 150–400)
RBC: 4.87 MIL/uL (ref 3.87–5.11)
RDW: 13.1 % (ref 11.5–15.5)
WBC: 9.6 10*3/uL (ref 4.0–10.5)
nRBC: 0 % (ref 0.0–0.2)

## 2022-04-13 LAB — COMPREHENSIVE METABOLIC PANEL
ALT: 45 U/L — ABNORMAL HIGH (ref 0–44)
AST: 33 U/L (ref 15–41)
Albumin: 2.7 g/dL — ABNORMAL LOW (ref 3.5–5.0)
Alkaline Phosphatase: 74 U/L (ref 38–126)
Anion gap: 11 (ref 5–15)
BUN: 26 mg/dL — ABNORMAL HIGH (ref 6–20)
CO2: 26 mmol/L (ref 22–32)
Calcium: 9.3 mg/dL (ref 8.9–10.3)
Chloride: 101 mmol/L (ref 98–111)
Creatinine, Ser: 1.12 mg/dL — ABNORMAL HIGH (ref 0.44–1.00)
GFR, Estimated: 57 mL/min — ABNORMAL LOW (ref >60)
Glucose, Bld: 165 mg/dL — ABNORMAL HIGH (ref 70–99)
Potassium: 4.5 mmol/L (ref 3.5–5.1)
Sodium: 138 mmol/L (ref 135–145)
Total Bilirubin: 0.6 mg/dL (ref 0.3–1.2)
Total Protein: 5.4 g/dL — ABNORMAL LOW (ref 6.5–8.1)

## 2022-04-13 LAB — MAGNESIUM: Magnesium: 2.6 mg/dL — ABNORMAL HIGH (ref 1.7–2.4)

## 2022-04-13 LAB — GLUCOSE, CAPILLARY
Glucose-Capillary: 119 mg/dL — ABNORMAL HIGH (ref 70–99)
Glucose-Capillary: 169 mg/dL — ABNORMAL HIGH (ref 70–99)

## 2022-04-13 LAB — LIPOPROTEIN A (LPA): Lipoprotein (a): 213.6 nmol/L — ABNORMAL HIGH (ref <75.0)

## 2022-04-13 LAB — PHOSPHORUS: Phosphorus: 5.1 mg/dL — ABNORMAL HIGH (ref 2.5–4.6)

## 2022-04-13 MED ORDER — CARVEDILOL 3.125 MG PO TABS
3.1250 mg | ORAL_TABLET | Freq: Two times a day (BID) | ORAL | 2 refills | Status: DC
  Filled 2022-04-13: qty 60, 30d supply, fill #0

## 2022-04-13 MED ORDER — LOSARTAN POTASSIUM 25 MG PO TABS
25.0000 mg | ORAL_TABLET | Freq: Every day | ORAL | 2 refills | Status: DC
  Filled 2022-04-13: qty 30, 30d supply, fill #0

## 2022-04-13 MED ORDER — FUROSEMIDE 40 MG PO TABS
40.0000 mg | ORAL_TABLET | Freq: Two times a day (BID) | ORAL | 2 refills | Status: DC
  Filled 2022-04-13: qty 60, 30d supply, fill #0

## 2022-04-13 MED ORDER — ADULT MULTIVITAMIN W/MINERALS CH: 1.0000 | ORAL_TABLET | Freq: Every day | ORAL | 2 refills | Status: AC

## 2022-04-13 MED ORDER — GLUCERNA SHAKE PO LIQD
237.0000 mL | Freq: Three times a day (TID) | ORAL | 2 refills | Status: AC
  Filled 2022-04-13: qty 21330, 30d supply, fill #0

## 2022-04-13 MED ORDER — DAPAGLIFLOZIN PROPANEDIOL 10 MG PO TABS
10.0000 mg | ORAL_TABLET | Freq: Every day | ORAL | 2 refills | Status: DC
  Filled 2022-04-13: qty 30, 30d supply, fill #0

## 2022-04-13 MED ORDER — SPIRONOLACTONE 25 MG PO TABS
25.0000 mg | ORAL_TABLET | Freq: Every day | ORAL | 2 refills | Status: DC
  Filled 2022-04-13: qty 30, 30d supply, fill #0

## 2022-04-13 NOTE — Progress Notes (Signed)
Mobility Specialist Progress Note:   04/13/22 1108  Mobility  Activity Ambulated with assistance in hallway  Level of Assistance Standby assist, set-up cues, supervision of patient - no hands on  Assistive Device Front wheel walker  Distance Ambulated (ft) 60 ft  Activity Response Tolerated well  $Mobility charge 1 Mobility   Pt received in bed willing to participate in mobility. No complaints of pain. Left EOB with call bell in reach and all needs met.     Mobility Specialist  

## 2022-04-13 NOTE — TOC Transition Note (Addendum)
Transition of Care [facility]) - CM/SW Discharge Note   Patient Details  Name: Stacey Lin MRN: [id number] Date of Birth: 06-06-1966  Transition of Care [facility]) CM/SW Contact:  Zenon Mayo, RN Phone Number: 04/13/2022, 12:43 PM   Clinical Narrative:    Patient is for dc today, she would like for her meds to be filled by [facility] and then [facility] will contact our [facility] for her next refills so they can refill them for her .  Patient states she has transportation at dc.   NCM contacted [facility] for patient to get this set up.  They state they will be able to fill her next meds for her because it will take a while for this to get started.   Final next level of care: Home/Self Care Barriers to Discharge: No Barriers Identified   Patient Goals and CMS Choice Patient states their goals for this hospitalization and ongoing recovery are:: return home   Choice offered to / list presented to : NA  Discharge Placement                       Discharge Plan and Services   Discharge Planning Services: CM Consult Post Acute Care Choice: NA            DME Agency: NA       HH Arranged: NA          Social Determinants of Health (SDOH) Interventions     Readmission Risk Interventions    04/13/2022   12:21 PM  Readmission Risk Prevention Plan  Transportation Screening Complete  PCP or Specialist Appt within 3-5 Days Complete  HRI or [place] Complete  Social Work Consult for [place] Planning/Counseling Complete  Palliative Care Screening Not Applicable  Medication Review [employer]) Complete

## 2022-04-13 NOTE — Progress Notes (Signed)
Patient given discharge instructions and stated understanding. 

## 2022-04-13 NOTE — Plan of Care (Signed)
Problem: Education: Goal: Ability to demonstrate management of disease process will improve Outcome: Adequate for Discharge Goal: Ability to verbalize understanding of medication therapies will improve Outcome: Adequate for Discharge Goal: Individualized Educational Video(s) Outcome: Adequate for Discharge   Problem: Activity: Goal: Capacity to carry out activities will improve Outcome: Adequate for Discharge   Problem: Cardiac: Goal: Ability to achieve and maintain adequate cardiopulmonary perfusion will improve Outcome: Adequate for Discharge   Problem: Education: Goal: Ability to describe self-care measures that may prevent or decrease complications (Diabetes Survival Skills Education) will improve Outcome: Adequate for Discharge Goal: Individualized Educational Video(s) Outcome: Adequate for Discharge   Problem: Coping: Goal: Ability to adjust to condition or change in health will improve Outcome: Adequate for Discharge   Problem: Fluid Volume: Goal: Ability to maintain a balanced intake and output will improve Outcome: Adequate for Discharge   Problem: Health Behavior/Discharge Planning: Goal: Ability to identify and utilize available resources and services will improve Outcome: Adequate for Discharge Goal: Ability to manage health-related needs will improve Outcome: Adequate for Discharge   Problem: Metabolic: Goal: Ability to maintain appropriate glucose levels will improve Outcome: Adequate for Discharge   Problem: Nutritional: Goal: Maintenance of adequate nutrition will improve Outcome: Adequate for Discharge Goal: Progress toward achieving an optimal weight will improve Outcome: Adequate for Discharge   Problem: Skin Integrity: Goal: Risk for impaired skin integrity will decrease Outcome: Adequate for Discharge   Problem: Tissue Perfusion: Goal: Adequacy of tissue perfusion will improve Outcome: Adequate for Discharge   Problem: Health  Behavior/Discharge Planning: Goal: Ability to manage health-related needs will improve Outcome: Adequate for Discharge   Problem: Clinical Measurements: Goal: Ability to maintain clinical measurements within normal limits will improve Outcome: Adequate for Discharge Goal: Will remain free from infection Outcome: Adequate for Discharge Goal: Diagnostic test results will improve Outcome: Adequate for Discharge Goal: Respiratory complications will improve Outcome: Adequate for Discharge Goal: Cardiovascular complication will be avoided Outcome: Adequate for Discharge   Problem: Activity: Goal: Risk for activity intolerance will decrease Outcome: Adequate for Discharge   Problem: Nutrition: Goal: Adequate nutrition will be maintained Outcome: Adequate for Discharge   Problem: Coping: Goal: Level of anxiety will decrease Outcome: Adequate for Discharge   Problem: Elimination: Goal: Will not experience complications related to bowel motility Outcome: Adequate for Discharge Goal: Will not experience complications related to urinary retention Outcome: Adequate for Discharge   Problem: Pain Managment: Goal: General experience of comfort will improve Outcome: Adequate for Discharge   Problem: Safety: Goal: Ability to remain free from injury will improve Outcome: Adequate for Discharge   Problem: Skin Integrity: Goal: Risk for impaired skin integrity will decrease Outcome: Adequate for Discharge   Problem: Acute Rehab PT Goals(only PT should resolve) Goal: Pt Will Ambulate Outcome: Adequate for Discharge Goal: Pt Will Go Up/Down Stairs Outcome: Adequate for Discharge   Problem: Increased Nutrient Needs (NI-5.1) Goal: Food and/or nutrient delivery Description: Individualized approach for food/nutrient provision. Outcome: Adequate for Discharge   Problem: Education: Goal: Understanding of CV disease, CV risk reduction, and recovery process will improve Outcome: Adequate  for Discharge Goal: Individualized Educational Video(s) Outcome: Adequate for Discharge   Problem: Activity: Goal: Ability to return to baseline activity level will improve Outcome: Adequate for Discharge   Problem: Cardiovascular: Goal: Ability to achieve and maintain adequate cardiovascular perfusion will improve Outcome: Adequate for Discharge Goal: Vascular access site(s) Level 0-1 will be maintained Outcome: Adequate for Discharge   Problem: Health Behavior/Discharge Planning: Goal: Ability to safely  manage health-related needs after discharge will improve Outcome: Adequate for Discharge

## 2022-04-13 NOTE — Discharge Summary (Incomplete)
SpO2 94%   BMI 29.15 kg/m   General exam: Appears calm and comfortable Respiratory system: Clear to auscultation. Respiratory effort normal. Cardiovascular system: S1 & S2 heard. Gastrointestinal system: Abdomen is nondistended, soft and nontender. No organomegaly or masses felt. Normal bowel sounds heard. Central nervous system: Alert and oriented. No focal neurological deficits. Musculoskeletal: No calf tenderness Skin: No cyanosis. No rashes Psychiatry: Judgement and insight appear normal. Mood & affect appropriate.   Condition at discharge: stable  The results of significant diagnostics from this hospitalization (including imaging, microbiology, ancillary and laboratory) are listed below for reference.   Imaging Studies: CT Angio Chest  Pulmonary Embolism (PE) W or WO Contrast  Result Date: 04/12/2022 CLINICAL DATA:  Chest pain and shortness of breath. EXAM: CT ANGIOGRAPHY CHEST WITH CONTRAST TECHNIQUE: Multidetector CT imaging of the chest was performed using the standard protocol during bolus administration of intravenous contrast. Multiplanar CT image reconstructions and MIPs were obtained to evaluate the vascular anatomy. RADIATION DOSE REDUCTION: This exam was performed according to the departmental dose-optimization program which includes automated exposure control, adjustment of the mA and/or kV according to patient size and/or use of iterative reconstruction technique. CONTRAST:  64m OMNIPAQUE IOHEXOL 350 MG/ML SOLN COMPARISON:  03/17/2022. FINDINGS: Cardiovascular: The heart is enlarged and there is a trace pericardial effusion. Minimal atherosclerotic calcification of the aorta without evidence of aneurysm. The pulmonary trunk is normal in caliber. No pulmonary artery filling defect is seen. Mediastinum/Nodes: No enlarged mediastinal, hilar, or axillary lymph nodes. Thyroid gland, trachea, and esophagus demonstrate no significant findings. Lungs/Pleura: The central airways are patent. Hazy ground-glass opacities are noted in the lungs bilaterally. Mild atelectasis is noted bilaterally. No consolidation, effusion, or pneumothorax. Upper Abdomen: No acute abnormality. Musculoskeletal: Degenerative changes in the thoracic spine. No acute or suspicious osseous abnormality. Review of the MIP images confirms the above findings. IMPRESSION: 1. No evidence of pulmonary embolism. 2. Hazy ground-glass opacities in the lungs bilaterally, possible edema or infiltrate. 3. Marked cardiomegaly with small pericardial effusion. 4. Aortic atherosclerosis. Electronically Signed   By: LBrett FairyM.D.   On: 04/12/2022 23:30   CARDIAC CATHETERIZATION  Result Date: 04/11/2022   There is severe left ventricular systolic dysfunction.   LV end diastolic  pressure is normal.   The left ventricular ejection fraction is less than 25% by visual estimate.   Hemodynamic findings consistent with mild pulmonary hypertension.   There is no aortic valve stenosis.   Aortic saturation 97%, PA saturation 64%, PA pressure 36/19, mean PA pressure 27 mmHg, mean pulmonary capillary wedge pressure 14 mmHg, cardiac output 3.28 L/min, cardiac index 1.96. No angiographically apparent coronary artery disease.  Fairly well controlled right heart pressures as well. Continue medical therapy for nonischemic cardiomyopathy.  I stressed the importance of medication compliance.   ECHOCARDIOGRAM COMPLETE  Result Date: 04/07/2022    ECHOCARDIOGRAM REPORT   Patient Name:   Stacey Lin Date of Exam: 04/07/2022 Medical Rec #:  [id number]      Height:       60.0 in Accession #:    [id number]     Weight:       160.8 lb Date of Birth:  627-Oct-1967      BSA:          1.701 m Patient Age:    89years        BP:           124/84 mmHg Patient Gender: F  SpO2 94%   BMI 29.15 kg/m   General exam: Appears calm and comfortable Respiratory system: Clear to auscultation. Respiratory effort normal. Cardiovascular system: S1 & S2 heard. Gastrointestinal system: Abdomen is nondistended, soft and nontender. No organomegaly or masses felt. Normal bowel sounds heard. Central nervous system: Alert and oriented. No focal neurological deficits. Musculoskeletal: No calf tenderness Skin: No cyanosis. No rashes Psychiatry: Judgement and insight appear normal. Mood & affect appropriate.   Condition at discharge: stable  The results of significant diagnostics from this hospitalization (including imaging, microbiology, ancillary and laboratory) are listed below for reference.   Imaging Studies: CT Angio Chest  Pulmonary Embolism (PE) W or WO Contrast  Result Date: 04/12/2022 CLINICAL DATA:  Chest pain and shortness of breath. EXAM: CT ANGIOGRAPHY CHEST WITH CONTRAST TECHNIQUE: Multidetector CT imaging of the chest was performed using the standard protocol during bolus administration of intravenous contrast. Multiplanar CT image reconstructions and MIPs were obtained to evaluate the vascular anatomy. RADIATION DOSE REDUCTION: This exam was performed according to the departmental dose-optimization program which includes automated exposure control, adjustment of the mA and/or kV according to patient size and/or use of iterative reconstruction technique. CONTRAST:  64m OMNIPAQUE IOHEXOL 350 MG/ML SOLN COMPARISON:  03/17/2022. FINDINGS: Cardiovascular: The heart is enlarged and there is a trace pericardial effusion. Minimal atherosclerotic calcification of the aorta without evidence of aneurysm. The pulmonary trunk is normal in caliber. No pulmonary artery filling defect is seen. Mediastinum/Nodes: No enlarged mediastinal, hilar, or axillary lymph nodes. Thyroid gland, trachea, and esophagus demonstrate no significant findings. Lungs/Pleura: The central airways are patent. Hazy ground-glass opacities are noted in the lungs bilaterally. Mild atelectasis is noted bilaterally. No consolidation, effusion, or pneumothorax. Upper Abdomen: No acute abnormality. Musculoskeletal: Degenerative changes in the thoracic spine. No acute or suspicious osseous abnormality. Review of the MIP images confirms the above findings. IMPRESSION: 1. No evidence of pulmonary embolism. 2. Hazy ground-glass opacities in the lungs bilaterally, possible edema or infiltrate. 3. Marked cardiomegaly with small pericardial effusion. 4. Aortic atherosclerosis. Electronically Signed   By: LBrett FairyM.D.   On: 04/12/2022 23:30   CARDIAC CATHETERIZATION  Result Date: 04/11/2022   There is severe left ventricular systolic dysfunction.   LV end diastolic  pressure is normal.   The left ventricular ejection fraction is less than 25% by visual estimate.   Hemodynamic findings consistent with mild pulmonary hypertension.   There is no aortic valve stenosis.   Aortic saturation 97%, PA saturation 64%, PA pressure 36/19, mean PA pressure 27 mmHg, mean pulmonary capillary wedge pressure 14 mmHg, cardiac output 3.28 L/min, cardiac index 1.96. No angiographically apparent coronary artery disease.  Fairly well controlled right heart pressures as well. Continue medical therapy for nonischemic cardiomyopathy.  I stressed the importance of medication compliance.   ECHOCARDIOGRAM COMPLETE  Result Date: 04/07/2022    ECHOCARDIOGRAM REPORT   Patient Name:   Stacey Lin Date of Exam: 04/07/2022 Medical Rec #:  [id number]      Height:       60.0 in Accession #:    [id number]     Weight:       160.8 lb Date of Birth:  627-Oct-1967      BSA:          1.701 m Patient Age:    89years        BP:           124/84 mmHg Patient Gender: F  SpO2 94%   BMI 29.15 kg/m   General exam: Appears calm and comfortable Respiratory system: Clear to auscultation. Respiratory effort normal. Cardiovascular system: S1 & S2 heard. Gastrointestinal system: Abdomen is nondistended, soft and nontender. No organomegaly or masses felt. Normal bowel sounds heard. Central nervous system: Alert and oriented. No focal neurological deficits. Musculoskeletal: No calf tenderness Skin: No cyanosis. No rashes Psychiatry: Judgement and insight appear normal. Mood & affect appropriate.   Condition at discharge: stable  The results of significant diagnostics from this hospitalization (including imaging, microbiology, ancillary and laboratory) are listed below for reference.   Imaging Studies: CT Angio Chest  Pulmonary Embolism (PE) W or WO Contrast  Result Date: 04/12/2022 CLINICAL DATA:  Chest pain and shortness of breath. EXAM: CT ANGIOGRAPHY CHEST WITH CONTRAST TECHNIQUE: Multidetector CT imaging of the chest was performed using the standard protocol during bolus administration of intravenous contrast. Multiplanar CT image reconstructions and MIPs were obtained to evaluate the vascular anatomy. RADIATION DOSE REDUCTION: This exam was performed according to the departmental dose-optimization program which includes automated exposure control, adjustment of the mA and/or kV according to patient size and/or use of iterative reconstruction technique. CONTRAST:  64m OMNIPAQUE IOHEXOL 350 MG/ML SOLN COMPARISON:  03/17/2022. FINDINGS: Cardiovascular: The heart is enlarged and there is a trace pericardial effusion. Minimal atherosclerotic calcification of the aorta without evidence of aneurysm. The pulmonary trunk is normal in caliber. No pulmonary artery filling defect is seen. Mediastinum/Nodes: No enlarged mediastinal, hilar, or axillary lymph nodes. Thyroid gland, trachea, and esophagus demonstrate no significant findings. Lungs/Pleura: The central airways are patent. Hazy ground-glass opacities are noted in the lungs bilaterally. Mild atelectasis is noted bilaterally. No consolidation, effusion, or pneumothorax. Upper Abdomen: No acute abnormality. Musculoskeletal: Degenerative changes in the thoracic spine. No acute or suspicious osseous abnormality. Review of the MIP images confirms the above findings. IMPRESSION: 1. No evidence of pulmonary embolism. 2. Hazy ground-glass opacities in the lungs bilaterally, possible edema or infiltrate. 3. Marked cardiomegaly with small pericardial effusion. 4. Aortic atherosclerosis. Electronically Signed   By: LBrett FairyM.D.   On: 04/12/2022 23:30   CARDIAC CATHETERIZATION  Result Date: 04/11/2022   There is severe left ventricular systolic dysfunction.   LV end diastolic  pressure is normal.   The left ventricular ejection fraction is less than 25% by visual estimate.   Hemodynamic findings consistent with mild pulmonary hypertension.   There is no aortic valve stenosis.   Aortic saturation 97%, PA saturation 64%, PA pressure 36/19, mean PA pressure 27 mmHg, mean pulmonary capillary wedge pressure 14 mmHg, cardiac output 3.28 L/min, cardiac index 1.96. No angiographically apparent coronary artery disease.  Fairly well controlled right heart pressures as well. Continue medical therapy for nonischemic cardiomyopathy.  I stressed the importance of medication compliance.   ECHOCARDIOGRAM COMPLETE  Result Date: 04/07/2022    ECHOCARDIOGRAM REPORT   Patient Name:   Stacey Lin Date of Exam: 04/07/2022 Medical Rec #:  [id number]      Height:       60.0 in Accession #:    [id number]     Weight:       160.8 lb Date of Birth:  627-Oct-1967      BSA:          1.701 m Patient Age:    89years        BP:           124/84 mmHg Patient Gender: F  Physician Discharge Summary   Patient: Stacey Lin MRN: [id number] DOB: [birth year]  Admit date:     04/06/2022  Discharge date: 04/13/2022  Discharge Physician: Cordelia Poche, MD   PCP: [name], NP   Recommendations at discharge:  ***  Discharge Diagnoses: Principal Problem:   Acute exacerbation of CHF (congestive heart failure) ([place]) Active Problems:   Acute respiratory failure with hypoxia ([facility])   Elevated troponin   COPD without exacerbation ([place])   Acute metabolic encephalopathy   Essential hypertension, benign   Controlled type 2 diabetes mellitus with hyperglycemia ([facility])   Transaminitis   Hypoalbuminemia   Polysubstance abuse (HCC)   Headache   Elevated LFTs   Obesity (BMI 30-39.9)   Depressed mood   Nonrheumatic aortic valve insufficiency  Resolved Problems:   * No resolved hospital problems. *  Hospital Course: Stacey Lin is a 56 y.o. female with medical history significant of hypertension, hyperlipidemia, congestive heart failure, diabetes mellitus type 2, COPD, and prior tobacco abuse who presented with complaints of shortness of breath progressively worsening over the last 2 weeks and found to have an heart failure exacerbation. Cardiology consulted. Transthoracic Echocardiogram significant for LVEF of 30-35% in addition to evidence of moderate-severe tricuspid mitral and aortic valve regurgitation. Heart catheterization performed and was negative for obstructive CAD. CTA chest negative for acute PE. Patient to follow-up with cardiology as an outpatient.  Assessment and Plan:  Acute combined systolic and diastolic heart failure ***  Acute respiratory failure with hypoxia ***  Elevated troponin ***  Acute metabolic encephalopathy ***  COPD Stable.  Primary hypertension ***  Diabetes mellitus Hemoglobin A1C of 6.2% consistent with controlled diabetes. Continue *** on discharge.  Elevated  AST/ALT*** ***  Hypoalbuminemia ***  Polysubstance abuse Cessation discussed.  Headache ***  Depressed mood ***  Overweight Body mass index is 29.15 kg/m.      {Tip this will not be part of the note when signed Body mass index is 29.15 kg/m. ,  Nutrition Documentation    Flowsheet Row ED to Hosp-Admission (Current) from 04/06/2022 in [facility]  Nutrition Problem Increased nutrient needs  Etiology chronic illness  [CHF]  Nutrition Goal Patient will meet greater than or equal to 90% of their needs  Interventions Prostat, Glucerna shake     ,  (Optional):26781}   Consultants: Cardiology Procedures performed: Transthoracic Echocardiogram, Left heart catheterization  Disposition: Home Diet recommendation: {Diet_Plan:26776}   DISCHARGE MEDICATION: Allergies as of 04/13/2022       Reactions   Ketoconazole Rash        Medication List     STOP taking these medications    ammonium lactate 12 % lotion Commonly known as: AmLactin   chlorpheniramine-HYDROcodone 10-8 MG/5ML Commonly known as: Tussionex Pennkinetic ER   [place] Urea 40 % ointment Generic drug: urea   predniSONE 10 MG tablet Commonly known as: DELTASONE       TAKE these medications    Accu-Chek Aviva Plus w/Device Kit 1 each by Does not apply route 3 (three) times daily.   Accu-Chek Guide test strip Generic drug: glucose blood TEST THREE TIMES DAILY   Accu-Chek Softclix Lancets lancets TEST BLOOD SUGAR THREE TIMES DAILY   acetaminophen 500 MG tablet Commonly known as: TYLENOL Take 500 mg by mouth every 6 (six) hours as needed for moderate pain or headache.   albuterol 108 (90 Base) MCG/ACT inhaler Commonly known as: VENTOLIN HFA Inhale 2 puffs into the lungs every 6 (six) hours  Physician Discharge Summary   Patient: Stacey Lin MRN: [id number] DOB: [birth year]  Admit date:     04/06/2022  Discharge date: 04/13/2022  Discharge Physician: Cordelia Poche, MD   PCP: [name], NP   Recommendations at discharge:  ***  Discharge Diagnoses: Principal Problem:   Acute exacerbation of CHF (congestive heart failure) ([place]) Active Problems:   Acute respiratory failure with hypoxia (HCC)   Elevated troponin   COPD without exacerbation ([place])   Acute metabolic encephalopathy   Essential hypertension, benign   Controlled type 2 diabetes mellitus with hyperglycemia ([facility]CC)   Transaminitis   Hypoalbuminemia   Polysubstance abuse (HCC)   Headache   Elevated LFTs   Obesity (BMI 30-39.9)   Depressed mood   Nonrheumatic aortic valve insufficiency  Resolved Problems:   * No resolved hospital problems. *  Hospital Course: Stacey Lin is a 56 y.o. female with medical history significant of hypertension, hyperlipidemia, congestive heart failure, diabetes mellitus type 2, COPD, and prior tobacco abuse who presented with complaints of shortness of breath progressively worsening over the last 2 weeks and found to have an heart failure exacerbation. Cardiology consulted. Transthoracic Echocardiogram significant for LVEF of 30-35% in addition to evidence of moderate-severe tricuspid mitral and aortic valve regurgitation. Heart catheterization performed and was negative for obstructive CAD. CTA chest negative for acute PE. Patient to follow-up with cardiology as an outpatient.  Assessment and Plan:  Acute combined systolic and diastolic heart failure ***  Acute respiratory failure with hypoxia ***  Elevated troponin ***  Acute metabolic encephalopathy ***  COPD Stable.  Primary hypertension ***  Diabetes mellitus Hemoglobin A1C of 6.2% consistent with controlled diabetes. Continue *** on discharge.  Elevated  AST/ALT*** ***  Hypoalbuminemia ***  Polysubstance abuse Cessation discussed.  Headache ***  Depressed mood ***  Overweight Body mass index is 29.15 kg/m.      {Tip this will not be part of the note when signed Body mass index is 29.15 kg/m. ,  Nutrition Documentation    Flowsheet Row ED to Hosp-Admission (Current) from 04/06/2022 in [facility]  Nutrition Problem Increased nutrient needs  Etiology chronic illness  [CHF]  Nutrition Goal Patient will meet greater than or equal to 90% of their needs  Interventions Prostat, Glucerna shake     ,  (Optional):26781}   Consultants: Cardiology Procedures performed: Transthoracic Echocardiogram, Left heart catheterization  Disposition: Home Diet recommendation: {Diet_Plan:26776}   DISCHARGE MEDICATION: Allergies as of 04/13/2022       Reactions   Ketoconazole Rash        Medication List     STOP taking these medications    ammonium lactate 12 % lotion Commonly known as: AmLactin   chlorpheniramine-HYDROcodone 10-8 MG/5ML Commonly known as: Tussionex Pennkinetic ER   [place] Urea 40 % ointment Generic drug: urea   predniSONE 10 MG tablet Commonly known as: DELTASONE       TAKE these medications    Accu-Chek Aviva Plus w/Device Kit 1 each by Does not apply route 3 (three) times daily.   Accu-Chek Guide test strip Generic drug: glucose blood TEST THREE TIMES DAILY   Accu-Chek Softclix Lancets lancets TEST BLOOD SUGAR THREE TIMES DAILY   acetaminophen 500 MG tablet Commonly known as: TYLENOL Take 500 mg by mouth every 6 (six) hours as needed for moderate pain or headache.   albuterol 108 (90 Base) MCG/ACT inhaler Commonly known as: VENTOLIN HFA Inhale 2 puffs into the lungs every 6 (six) hours  SpO2 94%   BMI 29.15 kg/m   General exam: Appears calm and comfortable Respiratory system: Clear to auscultation. Respiratory effort normal. Cardiovascular system: S1 & S2 heard. Gastrointestinal system: Abdomen is nondistended, soft and nontender. No organomegaly or masses felt. Normal bowel sounds heard. Central nervous system: Alert and oriented. No focal neurological deficits. Musculoskeletal: No calf tenderness Skin: No cyanosis. No rashes Psychiatry: Judgement and insight appear normal. Mood & affect appropriate.   Condition at discharge: stable  The results of significant diagnostics from this hospitalization (including imaging, microbiology, ancillary and laboratory) are listed below for reference.   Imaging Studies: CT Angio Chest  Pulmonary Embolism (PE) W or WO Contrast  Result Date: 04/12/2022 CLINICAL DATA:  Chest pain and shortness of breath. EXAM: CT ANGIOGRAPHY CHEST WITH CONTRAST TECHNIQUE: Multidetector CT imaging of the chest was performed using the standard protocol during bolus administration of intravenous contrast. Multiplanar CT image reconstructions and MIPs were obtained to evaluate the vascular anatomy. RADIATION DOSE REDUCTION: This exam was performed according to the departmental dose-optimization program which includes automated exposure control, adjustment of the mA and/or kV according to patient size and/or use of iterative reconstruction technique. CONTRAST:  64m OMNIPAQUE IOHEXOL 350 MG/ML SOLN COMPARISON:  03/17/2022. FINDINGS: Cardiovascular: The heart is enlarged and there is a trace pericardial effusion. Minimal atherosclerotic calcification of the aorta without evidence of aneurysm. The pulmonary trunk is normal in caliber. No pulmonary artery filling defect is seen. Mediastinum/Nodes: No enlarged mediastinal, hilar, or axillary lymph nodes. Thyroid gland, trachea, and esophagus demonstrate no significant findings. Lungs/Pleura: The central airways are patent. Hazy ground-glass opacities are noted in the lungs bilaterally. Mild atelectasis is noted bilaterally. No consolidation, effusion, or pneumothorax. Upper Abdomen: No acute abnormality. Musculoskeletal: Degenerative changes in the thoracic spine. No acute or suspicious osseous abnormality. Review of the MIP images confirms the above findings. IMPRESSION: 1. No evidence of pulmonary embolism. 2. Hazy ground-glass opacities in the lungs bilaterally, possible edema or infiltrate. 3. Marked cardiomegaly with small pericardial effusion. 4. Aortic atherosclerosis. Electronically Signed   By: LBrett FairyM.D.   On: 04/12/2022 23:30   CARDIAC CATHETERIZATION  Result Date: 04/11/2022   There is severe left ventricular systolic dysfunction.   LV end diastolic  pressure is normal.   The left ventricular ejection fraction is less than 25% by visual estimate.   Hemodynamic findings consistent with mild pulmonary hypertension.   There is no aortic valve stenosis.   Aortic saturation 97%, PA saturation 64%, PA pressure 36/19, mean PA pressure 27 mmHg, mean pulmonary capillary wedge pressure 14 mmHg, cardiac output 3.28 L/min, cardiac index 1.96. No angiographically apparent coronary artery disease.  Fairly well controlled right heart pressures as well. Continue medical therapy for nonischemic cardiomyopathy.  I stressed the importance of medication compliance.   ECHOCARDIOGRAM COMPLETE  Result Date: 04/07/2022    ECHOCARDIOGRAM REPORT   Patient Name:   Stacey Lin Date of Exam: 04/07/2022 Medical Rec #:  [id number]      Height:       60.0 in Accession #:    [id number]     Weight:       160.8 lb Date of Birth:  627-Oct-1967      BSA:          1.701 m Patient Age:    89years        BP:           124/84 mmHg Patient Gender: F  SpO2 94%   BMI 29.15 kg/m   General exam: Appears calm and comfortable Respiratory system: Clear to auscultation. Respiratory effort normal. Cardiovascular system: S1 & S2 heard. Gastrointestinal system: Abdomen is nondistended, soft and nontender. No organomegaly or masses felt. Normal bowel sounds heard. Central nervous system: Alert and oriented. No focal neurological deficits. Musculoskeletal: No calf tenderness Skin: No cyanosis. No rashes Psychiatry: Judgement and insight appear normal. Mood & affect appropriate.   Condition at discharge: stable  The results of significant diagnostics from this hospitalization (including imaging, microbiology, ancillary and laboratory) are listed below for reference.   Imaging Studies: CT Angio Chest  Pulmonary Embolism (PE) W or WO Contrast  Result Date: 04/12/2022 CLINICAL DATA:  Chest pain and shortness of breath. EXAM: CT ANGIOGRAPHY CHEST WITH CONTRAST TECHNIQUE: Multidetector CT imaging of the chest was performed using the standard protocol during bolus administration of intravenous contrast. Multiplanar CT image reconstructions and MIPs were obtained to evaluate the vascular anatomy. RADIATION DOSE REDUCTION: This exam was performed according to the departmental dose-optimization program which includes automated exposure control, adjustment of the mA and/or kV according to patient size and/or use of iterative reconstruction technique. CONTRAST:  64m OMNIPAQUE IOHEXOL 350 MG/ML SOLN COMPARISON:  03/17/2022. FINDINGS: Cardiovascular: The heart is enlarged and there is a trace pericardial effusion. Minimal atherosclerotic calcification of the aorta without evidence of aneurysm. The pulmonary trunk is normal in caliber. No pulmonary artery filling defect is seen. Mediastinum/Nodes: No enlarged mediastinal, hilar, or axillary lymph nodes. Thyroid gland, trachea, and esophagus demonstrate no significant findings. Lungs/Pleura: The central airways are patent. Hazy ground-glass opacities are noted in the lungs bilaterally. Mild atelectasis is noted bilaterally. No consolidation, effusion, or pneumothorax. Upper Abdomen: No acute abnormality. Musculoskeletal: Degenerative changes in the thoracic spine. No acute or suspicious osseous abnormality. Review of the MIP images confirms the above findings. IMPRESSION: 1. No evidence of pulmonary embolism. 2. Hazy ground-glass opacities in the lungs bilaterally, possible edema or infiltrate. 3. Marked cardiomegaly with small pericardial effusion. 4. Aortic atherosclerosis. Electronically Signed   By: LBrett FairyM.D.   On: 04/12/2022 23:30   CARDIAC CATHETERIZATION  Result Date: 04/11/2022   There is severe left ventricular systolic dysfunction.   LV end diastolic  pressure is normal.   The left ventricular ejection fraction is less than 25% by visual estimate.   Hemodynamic findings consistent with mild pulmonary hypertension.   There is no aortic valve stenosis.   Aortic saturation 97%, PA saturation 64%, PA pressure 36/19, mean PA pressure 27 mmHg, mean pulmonary capillary wedge pressure 14 mmHg, cardiac output 3.28 L/min, cardiac index 1.96. No angiographically apparent coronary artery disease.  Fairly well controlled right heart pressures as well. Continue medical therapy for nonischemic cardiomyopathy.  I stressed the importance of medication compliance.   ECHOCARDIOGRAM COMPLETE  Result Date: 04/07/2022    ECHOCARDIOGRAM REPORT   Patient Name:   Stacey Lin Date of Exam: 04/07/2022 Medical Rec #:  [id number]      Height:       60.0 in Accession #:    [id number]     Weight:       160.8 lb Date of Birth:  627-Oct-1967      BSA:          1.701 m Patient Age:    89years        BP:           124/84 mmHg Patient Gender: F

## 2022-04-13 NOTE — Progress Notes (Signed)
SATURATION QUALIFICATIONS:    Patient Saturations on Room Air at Rest = 97%  Patient Saturations on Room Air while Ambulating = 94%

## 2022-04-13 NOTE — Discharge Instructions (Addendum)
Stacey Lin,  You are in the hospital with newly diagnosed reduced function heart failure.  This was treated with blood pressure medications in addition to Lasix which is a diuretic (medication to make you pee out fluid).  Cardiology is managing your heart failure while in the hospital.  Thankfully, you have improved significantly for discharge.  Please ensure that you take your medications as prescribed.  Please follow-up with the cardiologist as an outpatient.  Please obtain repeat lab work in about 1 week; your PCP or the cardiologist can arrange this. I have included some information regarding management of the heart failure.  Important issues to look out for are increased trouble breathing, increased swelling, or increase in your weight.

## 2022-04-13 NOTE — Progress Notes (Addendum)
Progress Note  Patient Name: Stacey Lin Date of Encounter: 04/13/2022  [facility] Cardiologist: None   Subjective   Denies any chest pain or shortness of breath. Inpatient Medications    Scheduled Meds:  carvedilol  3.125 mg Oral BID WC   dapagliflozin propanediol  10 mg Oral Daily   enoxaparin (LOVENOX) injection  40 mg Subcutaneous Daily   escitalopram  20 mg Oral Daily   feeding supplement (GLUCERNA SHAKE)  237 mL Oral TID BM   fluticasone furoate-vilanterol  1 puff Inhalation Daily   And   umeclidinium bromide  1 puff Inhalation Daily   furosemide  40 mg Oral BID   insulin aspart  0-5 Units Subcutaneous QHS   insulin aspart  0-9 Units Subcutaneous TID WC   losartan  25 mg Oral Daily   multivitamin with minerals  1 tablet Oral Daily   sodium chloride flush  3 mL Intravenous Q12H   sodium chloride flush  3 mL Intravenous Q12H   sodium chloride flush  3 mL Intravenous Q12H   spironolactone  25 mg Oral Daily   Continuous Infusions:  sodium chloride     PRN Meds: sodium chloride, acetaminophen, albuterol, hydrALAZINE, LORazepam, naLOXone (NARCAN)  injection, ondansetron (ZOFRAN) IV, sodium chloride flush   Vital Signs    Vitals:   04/13/22 0619 04/13/22 0632 04/13/22 0758 04/13/22 1114  BP: 91/60 95/82 110/77 111/79  Pulse: 74  73 77  Resp: '20  17 19  '$ Temp: 97.9 F (36.6 C)  97.9 F (36.6 C) 97.9 F (36.6 C)  TempSrc: Oral  Oral Oral  SpO2: 96%  100% 94%  Weight: 67.7 kg     Height:        Intake/Output Summary (Last 24 hours) at 04/13/2022 1140 Last data filed at 04/13/2022 0758 Gross per 24 hour  Intake 1194 ml  Output 2900 ml  Net -1706 ml       04/13/2022    6:19 AM 04/12/2022    4:40 AM 04/11/2022    1:28 AM  Last 3 Weights  Weight (lbs) 149 lb 4 oz 154 lb 8 oz 153 lb 11.2 oz  Weight (kg) 67.7 kg 70.081 kg 69.718 kg      Telemetry    Normal sinus rhythm- personally Reviewed  ECG    No new EKG to review- Personally  Reviewed  Physical Exam   GEN: Well nourished, well developed in no acute distress HEENT: Normal NECK: No JVD; No carotid bruits LYMPHATICS: No lymphadenopathy CARDIAC:RRR, no rubs, gallops 2/6 systolic murmur at the left lower sternal border to the apex RESPIRATORY:  Clear to auscultation without rales, wheezing or rhonchi  ABDOMEN: Soft, non-tender, non-distended MUSCULOSKELETAL:  No edema; No deformity  SKIN: Warm and dry NEUROLOGIC:  Alert and oriented x 3 PSYCHIATRIC:  Normal affect. Labs    High Sensitivity Troponin:   Recent Labs  Lab 04/06/22 0740 04/06/22 0945  TROPONINIHS 46* 43*      Chemistry Recent Labs  Lab 04/10/22 0300 04/11/22 0420 04/11/22 1356 04/11/22 1403 04/12/22 0522 04/13/22 0338  NA 136 136   < > 138  137 137 138  K 4.3 5.0   < > 4.7  4.6 5.0 4.5  CL 98 99  --   --  102 101  CO2 27 30  --   --  25 26  GLUCOSE 236* 145*  --   --  204* 165*  BUN 34* 31*  --   --  28* 26*  CREATININE 1.20* 1.28*  --   --  1.23* 1.12*  CALCIUM 8.4* 8.9  --   --  9.0 9.3  MG 2.3 2.5*  --   --   --  2.6*  PROT 4.9* 5.3*  --   --  5.0* 5.4*  ALBUMIN 2.5* 2.5*  --   --  2.6* 2.7*  AST 28 22  --   --  28 33  ALT 48* 44  --   --  41 45*  ALKPHOS 84 76  --   --  70 74  BILITOT 1.1 0.7  --   --  0.7 0.6  GFRNONAA 52* 49*  --   --  51* 57*  ANIONGAP 11 7  --   --  10 11   < > = values in this interval not displayed.     Lipids No results for input(s): "CHOL", "TRIG", "HDL", "LABVLDL", "LDLCALC", "CHOLHDL" in the last 168 hours.  Hematology Recent Labs  Lab 04/11/22 0420 04/11/22 1356 04/11/22 1403 04/12/22 0522 04/13/22 0338  WBC 9.7  --   --  8.7 9.6  RBC 4.89  --   --  4.65 4.87  HGB 15.1*   < > 15.3*  15.3* 14.5 15.1*  HCT 46.8*   < > 45.0  45.0 45.1 46.5*  MCV 95.7  --   --  97.0 95.5  MCH 30.9  --   --  31.2 31.0  MCHC 32.3  --   --  32.2 32.5  RDW 13.1  --   --  13.2 13.1  PLT 308  --   --  304 292   < > = values in this interval not  displayed.    Thyroid  Recent Labs  Lab 04/06/22 1745  TSH 0.459     BNP No results for input(s): "BNP", "PROBNP" in the last 168 hours.   DDimer  No results for input(s): "DDIMER" in the last 168 hours.    Radiology    CT Angio Chest Pulmonary Embolism (PE) W or WO Contrast  Result Date: 04/12/2022 CLINICAL DATA:  Chest pain and shortness of breath. EXAM: CT ANGIOGRAPHY CHEST WITH CONTRAST TECHNIQUE: Multidetector CT imaging of the chest was performed using the standard protocol during bolus administration of intravenous contrast. Multiplanar CT image reconstructions and MIPs were obtained to evaluate the vascular anatomy. RADIATION DOSE REDUCTION: This exam was performed according to the departmental dose-optimization program which includes automated exposure control, adjustment of the mA and/or kV according to patient size and/or use of iterative reconstruction technique. CONTRAST:  24m OMNIPAQUE IOHEXOL 350 MG/ML SOLN COMPARISON:  03/17/2022. FINDINGS: Cardiovascular: The heart is enlarged and there is a trace pericardial effusion. Minimal atherosclerotic calcification of the aorta without evidence of aneurysm. The pulmonary trunk is normal in caliber. No pulmonary artery filling defect is seen. Mediastinum/Nodes: No enlarged mediastinal, hilar, or axillary lymph nodes. Thyroid gland, trachea, and esophagus demonstrate no significant findings. Lungs/Pleura: The central airways are patent. Hazy ground-glass opacities are noted in the lungs bilaterally. Mild atelectasis is noted bilaterally. No consolidation, effusion, or pneumothorax. Upper Abdomen: No acute abnormality. Musculoskeletal: Degenerative changes in the thoracic spine. No acute or suspicious osseous abnormality. Review of the MIP images confirms the above findings. IMPRESSION: 1. No evidence of pulmonary embolism. 2. Hazy ground-glass opacities in the lungs bilaterally, possible edema or infiltrate. 3. Marked cardiomegaly with  small pericardial effusion. 4. Aortic atherosclerosis. Electronically Signed   By: LBrett FairyM.D.   On: 04/12/2022  23:30   CARDIAC CATHETERIZATION  Result Date: 04/11/2022   There is severe left ventricular systolic dysfunction.   LV end diastolic pressure is normal.   The left ventricular ejection fraction is less than 25% by visual estimate.   Hemodynamic findings consistent with mild pulmonary hypertension.   There is no aortic valve stenosis.   Aortic saturation 97%, PA saturation 64%, PA pressure 36/19, mean PA pressure 27 mmHg, mean pulmonary capillary wedge pressure 14 mmHg, cardiac output 3.28 L/min, cardiac index 1.96. No angiographically apparent coronary artery disease.  Fairly well controlled right heart pressures as well. Continue medical therapy for nonischemic cardiomyopathy.  I stressed the importance of medication compliance.    Cardiac Studies   Echocardiogram 04/07/22 1. Significant change in LVEF was 55-60% in 2019. Left ventricular  ejection fraction, by estimation, is 30 to 35%. The left ventricle has  moderately decreased function. The left ventricle demonstrates global  hypokinesis. The left ventricular internal  cavity size was moderately dilated. Left ventricular diastolic parameters  are consistent with Grade I diastolic dysfunction (impaired relaxation).   2. Right ventricular systolic function is normal. The right ventricular  size is normal. There is mildly elevated pulmonary artery systolic  pressure.   3. Left atrial size was severely dilated.   4. Right atrial size was severely dilated.   5. The mitral valve is normal in structure. Moderate to severe mitral  valve regurgitation. Mild mitral stenosis. The mean mitral valve gradient  is 8.5 mmHg.   6. Tricuspid valve regurgitation is moderate to severe.   7. The aortic valve is normal in structure. Aortic valve regurgitation is  moderate to severe. No aortic stenosis is present.   8. The inferior vena cava  is normal in size with greater than 50%  respiratory variability, suggesting right atrial pressure of 3 mmHg.   Comparison(s): A prior study was performed on 04/10/2018. The left  ventricular function is significantly worse. Aortic regugitation, mitral  regurgitation and tricuspid regurgitation have worsened.   Patient Profile     56 y.o. female with a history of heart failure, HLD, HTN, mitral valve regurgitation, polysubstance abuse, asthma, anemia who presented with SOB, was seen 6/14 for evaluation of decompensated heart failure  Assessment & Plan    Altered Level of Consciousness  -Urine drug screen was positive for cocaine -Psych consult in progress  Acute decompensated systolic heart failure  - Patient previously had a reduced EF of 20-25% in 03/2016. Echo in 03/2017 showed that EF had improved to 60-65% with grade I diastolic dysfunction  - Echo this admission showed EF 65-46%, grade I diastolic dysfunction, normal RV systolic function, severe dilation of LA and RA, moderate to severe MVR, moderate to severe  TVR, moderate to severe AI  - Suspect reduced EF is in part due to substance abuse and noncompliance with home medications.  - Patient has been educated on the importance of medication compliance, stopping substance abuse.  - cath this admission with no CAD and mild PHTN.  CO 3.28 and CI 1.96, mRAP 30mHg, PAP 36/19 with mean PAP 261mg, PCWP 1449m and LVEDP 11m65m   - serum creatinine stable (1.07->>1.2->> 1.28 ->>1.23 ->> 1.12 today) - She put out 3.6 L yesterday and is net -10 L since admission - weight down 12lbs from admit - SOB resolved - transition to Lasix '40mg'$  PO IBD - Continue farxiga 10 mg daily, Losartan '25mg'$  daily and carvedilol 3.125 mg twice daily - did not tolerate Entresto due to cough  in the past   History of Rheumatic Valvular Heart Disease  - Echo 03/2018 showed mild-moderate aortic valve regurgitation, mild mitral valve regurgitation, moderate tricuspid  valve regurgitation  - Echo this admission with severe dilation of LA and RA,  and progression of AR and MR which is now moderate to severe MVR and moderate to severe AI  - Her valvular heart disease will need to be addressed at outpt visit    Polysubstance Abuse - Continues to use cocaine and smoke cigarettes  - Patient has been counseled on the importance of stopping these substances, and their negative effects on heart health    Elevated trop  - hsTn 46>>4335 - Minimal elevation with flat trend-- not consistent with Acs, suspect secondary to demand in the setting of heart failure exacerbation  - cath with no CAD  Otherwise Per primary  - Type 2 DM  - Transaminitis  - Hypoalbuminemia  - COPD  [facility] will sign off.   Medication Recommendations: Carvedilol 3.125 mg twice daily, Farxiga 10 mg daily, Lasix 40 mg p.o. twice daily, losartan 25 mg daily and spironolactone 25 mg daily Other recommendations (labs, testing, etc): Bmet in 1 week Follow up as an outpatient: Dr. Haroldine Laws or extender in advanced heart failure clinic in 7 to 10 days (she has been followed in advanced heart failure clinic but has failed to follow-up in the past 2 years)  I have spent a total of 35 minutes with patient reviewing 2D echo , telemetry, EKGs, labs and examining patient as well as establishing an assessment and plan that was discussed with the patient.  > 50% of time was spent in direct patient care.     For questions or updates, please contact [place] Please consult [email or web address] for contact info under    Signed, Fransico Him, MD  04/13/2022, 11:40 AM

## 2022-04-14 ENCOUNTER — Telehealth: Payer: Self-pay

## 2022-04-14 NOTE — Telephone Encounter (Signed)
Transition Care Management Follow-up Telephone Call Date of discharge and from where: 04/13/2022, [facility]: [place] Rhonda W How have you been since you were released from the hospital? She stated she is feeling okay.  Any questions or concerns? Yes - she would like a 3:1 commode to use with her toilet.   Items Reviewed: Did the pt receive and understand the discharge instructions provided? Yes  Medications obtained and verified? Yes - she said she has all of her medications and did not have any questions about the med regime . She does not have a glucometer Other? No  Any new allergies since your discharge? No  Dietary orders reviewed? Yes Do you have support at home? Yes   Home Care and Equipment/Supplies: Were home health services ordered? no If so, what is the name of the agency? N/a  Has the agency set up a time to come to the patient's home? not applicable Were any new equipment or medical supplies ordered?  No What is the name of the medical supply agency? N//a Were you able to get the supplies/equipment? not applicable Do you have any questions related to the use of the equipment or supplies? No  Functional Questionnaire: (I = Independent and D = Dependent) ADLs: independent with personal care, has walker to use with ambulation.    Follow up appointments reviewed:  PCP Hospital f/u appt confirmed? Yes  Scheduled to see Juluis Mire, NP - 05/16/2022.  She did not want to re-schedule to be seen sooner.   [facility] f/u appt confirmed? Yes  Scheduled to see Hall - 04/20/2022.  Are transportation arrangements needed? No  If their condition worsens, is the pt aware to call PCP or go to the Emergency Dept.? Yes Was the patient provided with contact information for the PCP's office or ED? Yes Was to pt encouraged to call back with questions or concerns? Yes

## 2022-04-20 ENCOUNTER — Encounter (HOSPITAL_COMMUNITY): Payer: Medicare Other

## 2022-04-29 ENCOUNTER — Telehealth (HOSPITAL_COMMUNITY): Payer: Self-pay

## 2022-04-29 NOTE — Telephone Encounter (Signed)
Transitions of Care Pharmacy   Call attempted for a pharmacy transitions of care follow-up. Patient does not have a voicemail box.   Call attempt #1. Will follow-up in 2-3 days.

## 2022-05-02 ENCOUNTER — Other Ambulatory Visit (HOSPITAL_COMMUNITY): Payer: Self-pay

## 2022-05-02 ENCOUNTER — Telehealth (HOSPITAL_COMMUNITY): Payer: Self-pay

## 2022-05-02 NOTE — Telephone Encounter (Signed)
Transitions of Care Pharmacy   Call attempted for a pharmacy transitions of care follow-up. Patient ended call upon answering.   Call attempt #2. Will follow-up in 2-3 days.    Lind Guest, PharmD Clinical Pharmacy Resident  [facility] 05/02/2022 3:28 PM

## 2022-05-03 ENCOUNTER — Telehealth (HOSPITAL_COMMUNITY): Payer: Self-pay

## 2022-05-03 NOTE — Telephone Encounter (Signed)
Transitions of Care Pharmacy   Call attempted for a pharmacy transitions of care follow-up. Patient does not have voicemail box available.   Call attempt #3.

## 2022-05-04 ENCOUNTER — Telehealth (HOSPITAL_COMMUNITY): Payer: Self-pay

## 2022-05-04 NOTE — Telephone Encounter (Signed)
Called to confirm/remind patient of their appointment at the [facility] on 05/05/22. However, patient needed to be rescheduled for 05/27/22 at 9:00 am and agreed understanding.

## 2022-05-05 ENCOUNTER — Encounter (HOSPITAL_COMMUNITY): Payer: Medicare Other

## 2022-05-05 ENCOUNTER — Encounter (HOSPITAL_COMMUNITY): Payer: Self-pay

## 2022-05-06 ENCOUNTER — Other Ambulatory Visit (HOSPITAL_COMMUNITY): Payer: Self-pay

## 2022-05-09 ENCOUNTER — Ambulatory Visit: Payer: Medicare Other | Admitting: Podiatry

## 2022-05-16 ENCOUNTER — Ambulatory Visit (INDEPENDENT_AMBULATORY_CARE_PROVIDER_SITE_OTHER): Payer: Medicare Other | Admitting: Primary Care

## 2022-05-18 LAB — HM DIABETES EYE EXAM

## 2022-05-19 ENCOUNTER — Ambulatory Visit (INDEPENDENT_AMBULATORY_CARE_PROVIDER_SITE_OTHER): Payer: Medicare Other | Admitting: Primary Care

## 2022-05-19 ENCOUNTER — Encounter (INDEPENDENT_AMBULATORY_CARE_PROVIDER_SITE_OTHER): Payer: Self-pay | Admitting: Primary Care

## 2022-05-19 VITALS — BP 122/81 | HR 62 | Temp 98.2°F | Ht 60.0 in | Wt 149.0 lb

## 2022-05-19 DIAGNOSIS — L299 Pruritus, unspecified: Secondary | ICD-10-CM

## 2022-05-19 DIAGNOSIS — R4589 Other symptoms and signs involving emotional state: Secondary | ICD-10-CM | POA: Diagnosis not present

## 2022-05-19 DIAGNOSIS — Z09 Encounter for follow-up examination after completed treatment for conditions other than malignant neoplasm: Secondary | ICD-10-CM

## 2022-05-19 MED ORDER — ESCITALOPRAM OXALATE 20 MG PO TABS: 20.0000 mg | ORAL_TABLET | Freq: Every day | ORAL | 1 refills | Status: DC

## 2022-05-19 MED ORDER — HYDROXYZINE HCL 10 MG PO TABS: 10.0000 mg | ORAL_TABLET | Freq: Three times a day (TID) | ORAL | 0 refills | Status: DC | PRN

## 2022-05-19 NOTE — Progress Notes (Signed)
[facility]   Subjective:  [name] is a 56 y.o. female presents for hospital follow up. Admit date to the hospital was 04/06/22, patient was discharged from the hospital on 04/13/22, patient was admitted for: congestive heart failure. Patient has No headache, No chest pain, No abdominal pain - No Nausea, No new weakness tingling or numbness, No Cough - shortness of breath . The only c/o is pruritis from sitting outside and mosquito, and bugs.   Past Medical History:  Diagnosis Date   Anemia    Anginal pain (HCC)    2 months   Arrhythmia    Asthma    CHF (congestive heart failure) (HCC)    1990s   Chronic headaches    COPD (chronic obstructive pulmonary disease) ([place])    ??   Hypercholesteremia    Hypertension      Allergies  Allergen Reactions   Ketoconazole Rash      Current Outpatient Medications on File Prior to Visit  Medication Sig Dispense Refill   ACCU-CHEK GUIDE test strip TEST THREE TIMES DAILY 300 strip 0   Accu-Chek Softclix Lancets lancets TEST BLOOD SUGAR THREE TIMES DAILY 300 each 0   acetaminophen (TYLENOL) 500 MG tablet Take 500 mg by mouth every 6 (six) hours as needed for moderate pain or headache.     allopurinol (ZYLOPRIM) 100 MG tablet TAKE 1 TABLET TWICE DAILY 180 tablet 0   azelastine (ASTELIN) 0.1 % nasal spray Place 2 sprays into both nostrils 2 (two) times daily. Use in each nostril as directed 30 mL 12   Blood Glucose Monitoring Suppl (ACCU-CHEK AVIVA PLUS) w/Device KIT 1 each by Does not apply route 3 (three) times daily. 1 kit 0   Budeson-Glycopyrrol-Formoterol (BREZTRI AEROSPHERE) 160-9-4.8 MCG/ACT AERO Inhale 2 puffs into the lungs in the morning and at bedtime. 10.7 g 3   carvedilol (COREG) 3.125 MG tablet Take 1 tablet (3.125 mg total) by mouth 2 (two) times daily with a meal. 60 tablet 2   dapagliflozin propanediol (FARXIGA) 10 MG TABS tablet Take 1 tablet (10 mg total) by mouth daily. 30 tablet 2   famotidine  (PEPCID) 20 MG tablet One after supper 30 tablet 11   feeding supplement, GLUCERNA SHAKE, (GLUCERNA SHAKE) LIQD Take 237 mLs by mouth 3 (three) times daily between meals. 21330 mL 2   furosemide (LASIX) 40 MG tablet Take 1 tablet (40 mg total) by mouth 2 (two) times daily. 60 tablet 2   losartan (COZAAR) 25 MG tablet Take 1 tablet (25 mg total) by mouth daily. 30 tablet 2   metFORMIN (GLUCOPHAGE-XR) 500 MG 24 hr tablet TAKE 1 TABLETS ONCE A DAY 90 tablet 1   montelukast (SINGULAIR) 10 MG tablet Take 1 tablet (10 mg total) by mouth at bedtime. 30 tablet 5   Multiple Vitamin (MULTIVITAMIN WITH MINERALS) TABS tablet Take 1 tablet by mouth daily. 30 tablet 2   spironolactone (ALDACTONE) 25 MG tablet Take 1 tablet (25 mg total) by mouth daily. 30 tablet 2   SYMBICORT 80-4.5 MCG/ACT inhaler Inhale 2 puffs into the lungs daily as needed (sob/wheezing).     No current facility-administered medications on file prior to visit.     Review of System: Comprehensive ROS Pertinent positive and negative noted in HPI    Objective:  BP 122/81   Pulse 62   Temp 98.2 F (36.8 C) (Oral)   Ht 5' (1.524 m)   Wt 149 lb (67.6 kg)   LMP  (LMP  Unknown)   SpO2 95%   BMI 29.10 kg/m   Filed Weights   05/19/22 1417  Weight: 149 lb (67.6 kg)    Physical Exam: General Appearance: Well nourished, in no apparent distress. Eyes: PERRLA, EOMs, conjunctiva no swelling or erythema Sinuses: No Frontal/maxillary tenderness ENT/Mouth: Ext aud canals clear, TMs without erythema, bulging.  Hearing normal.  Neck: Supple, thyroid normal.  Respiratory: Respiratory effort normal, BS equal bilaterally without rales, rhonchi, wheezing or stridor.  Cardio: RRR with no MRGs. Brisk peripheral pulses without edema.  Abdomen: Soft, + BS.  Non tender, no guarding, rebound, hernias, masses. Lymphatics: Non tender without lymphadenopathy.  Musculoskeletal: Full ROM, 5/5 strength, normal gait.  Skin: Warm, dry without rashes,  lesions, ecchymosis.  Neuro: Cranial nerves intact. Normal muscle tone, no cerebellar symptoms. Sensation intact.  Psych: Awake and oriented X 3, normal affect, Insight and Judgment appropriate.  Assessment:   Meds ordered this encounter  Medications   escitalopram (LEXAPRO) 20 MG tablet    Sig: Take 1 tablet (20 mg total) by mouth daily.    Dispense:  90 tablet    Refill:  1    Courtesy refill. Patient will need and office visit for further refills.    Order Specific Question:   Supervising Provider    Answer:   Tresa Garter [[id number]]   hydrOXYzine (ATARAX) 10 MG tablet    Sig: Take 1 tablet (10 mg total) by mouth 3 (three) times daily as needed.    Dispense:  30 tablet    Refill:  0    Order Specific Question:   Supervising Provider    Answer:   Tresa Garter [[id number]]  Stacey Lin was seen today for hospitalization follow-up.  Diagnoses and all orders for this visit:  Pruritus -     hydrOXYzine (ATARAX) 10 MG tablet; Take 1 tablet (10 mg total) by mouth 3 (three) times daily as needed.  Depressed mood -     escitalopram (LEXAPRO) 20 MG tablet; Take 1 tablet (20 mg total) by mouth daily.  Hospital discharge follow-up Dyspnea secondary to diastolic CHF exacerbation-  Follow up with Lea   No cardiac problems on this follow up. T2D- controlled  This note has been created with Surveyor, quantity. Any transcriptional errors are unintentional.   [name], NP 05/19/2022, 5:07 PM

## 2022-05-26 ENCOUNTER — Telehealth (HOSPITAL_COMMUNITY): Payer: Self-pay

## 2022-05-26 NOTE — Telephone Encounter (Signed)
Called to confirm/remind patient of their appointment at the [facility] on 05/27/22.   Patient reminded to bring all medications and/or complete list.  Confirmed patient has transportation. Gave directions, instructed to utilize [place] parking.  Confirmed appointment prior to ending call.

## 2022-05-27 ENCOUNTER — Encounter (HOSPITAL_COMMUNITY): Payer: Medicare Other

## 2022-07-19 ENCOUNTER — Other Ambulatory Visit (INDEPENDENT_AMBULATORY_CARE_PROVIDER_SITE_OTHER): Payer: Self-pay | Admitting: Primary Care

## 2022-07-19 ENCOUNTER — Ambulatory Visit (INDEPENDENT_AMBULATORY_CARE_PROVIDER_SITE_OTHER): Payer: Medicare Other | Admitting: Podiatry

## 2022-07-19 DIAGNOSIS — E119 Type 2 diabetes mellitus without complications: Secondary | ICD-10-CM

## 2022-07-19 DIAGNOSIS — M79674 Pain in right toe(s): Secondary | ICD-10-CM

## 2022-07-19 DIAGNOSIS — B351 Tinea unguium: Secondary | ICD-10-CM

## 2022-07-19 DIAGNOSIS — L84 Corns and callosities: Secondary | ICD-10-CM

## 2022-07-19 DIAGNOSIS — M79675 Pain in left toe(s): Secondary | ICD-10-CM

## 2022-07-19 NOTE — Telephone Encounter (Signed)
Will forward to provider  

## 2022-07-22 NOTE — Progress Notes (Signed)
Subjective:  Patient ID: Stacey Lin, female    DOB: 03/04/1966,  MRN: [id number]  Chief Complaint  Patient presents with   Nail Problem    Thick painful toenails, 3 month follow up    Diabetes    56 y.o. female presents with the above complaint. History confirmed with patient.  Debridement for both these issues have been helpful.  She says her diabetes is well controlled  Objective:  Physical Exam: warm, good capillary refill, no trophic changes or ulcerative lesions, and normal DP and PT pulses. Left Foot: dystrophic yellowed discolored nail plates with subungual debris Right Foot: dystrophic yellowed discolored nail plates with subungual debris and right hallux callus   Assessment:   1. Pain due to onychomycosis of toenails of both feet   2. Callus   3. Controlled type 2 diabetes mellitus without complication, without long-term current use of insulin (HCC)      Plan:  Patient was evaluated and treated and all questions answered.  Patient educated on diabetes. Discussed proper diabetic foot care and discussed risks and complications of disease. Educated patient in depth on reasons to return to the office immediately should he/she discover anything concerning or new on the feet. All questions answered. Discussed proper shoes as well.   Discussed the etiology and treatment options for the condition in detail with the patient. Educated patient on the topical and oral treatment options for mycotic nails. Recommended debridement of the nails today. Sharp and mechanical debridement performed of all painful and mycotic nails today. Nails debrided in length and thickness using a nail nipper to level of comfort. Discussed treatment options including appropriate shoe gear. Follow up as needed for painful nails.   All symptomatic hyperkeratoses were safely debrided with a sterile #15 blade to patient's level of comfort without incident. We discussed preventative and palliative care of  these lesions including supportive and accommodative shoegear, padding, prefabricated and custom molded accommodative orthoses, use of a pumice stone and lotions/creams daily.   Return in about 3 months (around 10/18/2022) for at risk diabetic foot care.

## 2022-08-04 ENCOUNTER — Ambulatory Visit (INDEPENDENT_AMBULATORY_CARE_PROVIDER_SITE_OTHER): Payer: Medicare Other | Admitting: Primary Care

## 2022-08-04 ENCOUNTER — Encounter (INDEPENDENT_AMBULATORY_CARE_PROVIDER_SITE_OTHER): Payer: Self-pay | Admitting: Primary Care

## 2022-08-04 ENCOUNTER — Other Ambulatory Visit (HOSPITAL_COMMUNITY)
Admission: RE | Admit: 2022-08-04 | Discharge: 2022-08-04 | Disposition: A | Discharge status: Home / Self Care | Payer: Medicare Other | Source: Ambulatory Visit | Attending: Primary Care | Admitting: Primary Care

## 2022-08-04 VITALS — BP 159/90 | HR 99 | Ht 60.1 in | Wt 156.0 lb

## 2022-08-04 DIAGNOSIS — E119 Type 2 diabetes mellitus without complications: Secondary | ICD-10-CM | POA: Diagnosis not present

## 2022-08-04 DIAGNOSIS — N898 Other specified noninflammatory disorders of vagina: Secondary | ICD-10-CM

## 2022-08-04 DIAGNOSIS — R4589 Other symptoms and signs involving emotional state: Secondary | ICD-10-CM | POA: Diagnosis not present

## 2022-08-04 DIAGNOSIS — J4541 Moderate persistent asthma with (acute) exacerbation: Secondary | ICD-10-CM | POA: Diagnosis not present

## 2022-08-04 DIAGNOSIS — Z760 Encounter for issue of repeat prescription: Secondary | ICD-10-CM | POA: Diagnosis not present

## 2022-08-04 LAB — POCT GLYCOSYLATED HEMOGLOBIN (HGB A1C): HbA1c, POC (controlled diabetic range): 6.1 % (ref 0.0–7.0)

## 2022-08-04 MED ORDER — METFORMIN HCL ER 500 MG PO TB24: ORAL_TABLET | ORAL | 1 refills | Status: DC

## 2022-08-04 MED ORDER — SYMBICORT 80-4.5 MCG/ACT IN AERO: 2.0000 | INHALATION_SPRAY | Freq: Every day | RESPIRATORY_TRACT | 6 refills | Status: DC | PRN

## 2022-08-04 MED ORDER — LOSARTAN POTASSIUM 25 MG PO TABS: 25.0000 mg | ORAL_TABLET | Freq: Every day | ORAL | 2 refills | Status: DC

## 2022-08-04 MED ORDER — MONTELUKAST SODIUM 10 MG PO TABS: 10.0000 mg | ORAL_TABLET | Freq: Every day | ORAL | 5 refills | Status: DC

## 2022-08-04 MED ORDER — ESCITALOPRAM OXALATE 20 MG PO TABS: 20.0000 mg | ORAL_TABLET | Freq: Every day | ORAL | 1 refills | Status: DC

## 2022-08-04 MED ORDER — GABAPENTIN 300 MG PO CAPS: 300.0000 mg | ORAL_CAPSULE | Freq: Two times a day (BID) | ORAL | 3 refills | Status: DC

## 2022-08-04 MED ORDER — ALLOPURINOL 100 MG PO TABS: 100.0000 mg | ORAL_TABLET | Freq: Two times a day (BID) | ORAL | 1 refills | Status: DC
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
## 2022-08-04 NOTE — Progress Notes (Signed)
DOB Confirmed

## 2022-08-04 NOTE — Addendum Note (Signed)
Addended by: Leamon Arnt I on: 08/04/2022 05:26 PM   Modules accepted: Orders

## 2022-08-05 ENCOUNTER — Other Ambulatory Visit (INDEPENDENT_AMBULATORY_CARE_PROVIDER_SITE_OTHER): Payer: Self-pay | Admitting: Primary Care

## 2022-08-05 NOTE — Telephone Encounter (Signed)
Medication Refill - Medication: carvedilol (COREG) 3.125 MG tablet furosemide (LASIX) 40 MG tablet  Has the patient contacted their pharmacy? Yes.   Pharmacy is calling for the pt.  They said they got several prescriptions for the pt, but these were not included. Preferred Pharmacy (with phone number or street name): [facility] - [name], [place] - [address]  Has the patient been seen for an appointment in the last year OR does the patient have an upcoming appointment? Yes.    Agent: Please be advised that RX refills may take up to 3 business days. We ask that you follow-up with your pharmacy.  [place], [place] wants to know if pt is still taking spironolactone (ALDACTONE) 25 MG tablet?

## 2022-08-05 NOTE — Telephone Encounter (Signed)
Requested medication (s) are due for refill today: unsure  Requested medication (s) are on the active medication list no  Last refill:  unsure  Future visit scheduled: yes  Notes to clinic:  Unable to refill per protocol, Rx expired.  Medication is not on current list, routing for approval.     Requested Prescriptions  Pending Prescriptions Disp Refills   spironolactone (ALDACTONE) 25 MG tablet [Pharmacy Med Name: spironolactone 25 mg tablet] 30 tablet 2    Sig: TAKE ONE TABLET BY MOUTH ONCE DAILY     Cardiovascular: Diuretics - Aldosterone Antagonist Failed - 08/05/2022 10:35 AM      Failed - Cr in normal range and within 180 days    Creat  Date Value Ref Range Status  09/16/2015 1.49 (H) 0.50 - 1.05 mg/dL Final   Creatinine, Ser  Date Value Ref Range Status  04/13/2022 1.12 (H) 0.44 - 1.00 mg/dL Final   Creatinine,U  Date Value Ref Range Status  08/05/2009 115.4 mg/dL Final    Comment:    See lab report for associated comment(s)         Failed - Last BP in normal range    BP Readings from Last 1 Encounters:  08/04/22 (!) 159/90         Passed - K in normal range and within 180 days    Potassium  Date Value Ref Range Status  04/13/2022 4.5 3.5 - 5.1 mmol/L Final         Passed - Na in normal range and within 180 days    Sodium  Date Value Ref Range Status  04/13/2022 138 135 - 145 mmol/L Final  11/27/2018 143 134 - 144 mmol/L Final         Passed - eGFR is 30 or above and within 180 days    GFR, Est African American  Date Value Ref Range Status  03/31/2014 62 mL/min Final   GFR calc Af Amer  Date Value Ref Range Status  09/03/2019 >60 >60 mL/min Final   GFR, Est Non African American  Date Value Ref Range Status  03/31/2014 54 (L) mL/min Final    Comment:      The estimated GFR is a calculation valid for adults (>=80 years old) that uses the CKD-EPI algorithm to adjust for age and sex. It is   not to be used for children, pregnant women,  hospitalized patients,    patients on dialysis, or with rapidly changing kidney function. According to the NKDEP, eGFR >89 is normal, 60-89 shows mild impairment, 30-59 shows moderate impairment, 15-29 shows severe impairment and <15 is ESRD.     GFR, Estimated  Date Value Ref Range Status  04/13/2022 57 (L) >60 mL/min Final    Comment:    (NOTE) Calculated using the CKD-EPI Creatinine Equation (2021)          Passed - Valid encounter within last 6 months    Recent Outpatient Visits           Yesterday Vaginal itching   [place], Michelle P, NP   2 months ago [place]   [place] Juluis Mire P, NP   7 months ago Asthmatic bronchitis with exacerbation, moderate persistent   [facility] Juluis Mire P, NP   9 months ago Type 2 diabetes mellitus without complication, without long-term current use of insulin ([place])   [facility] [name], NP  1 year ago Type 2 diabetes mellitus without complication, without long-term current use of insulin ([place])   [place] [facility] [name], NP       Future Appointments             In 3 weeks Oletta Lamas Milford Cage, NP [place]

## 2022-08-08 LAB — CERVICOVAGINAL ANCILLARY ONLY
Bacterial Vaginitis (gardnerella): NEGATIVE
Candida Glabrata: NEGATIVE
Candida Vaginitis: POSITIVE — AB
Chlamydia: NEGATIVE
Comment: NEGATIVE
Comment: NEGATIVE
Comment: NEGATIVE
Comment: NEGATIVE
Comment: NEGATIVE
Comment: NORMAL
Neisseria Gonorrhea: NEGATIVE
Trichomonas: NEGATIVE

## 2022-08-08 NOTE — Telephone Encounter (Signed)
Requested medication (s) are due for refill today - yes/no  Requested medication (s) are on the active medication list -yes/no  Future visit scheduled -yes  Last refill: carvedilol 04/13/22 #60 2RF                 Furosamide 04/13/22 #60 2RF                 Wilder Glade- not on current medication list    Notes to clinic: all above medications - outside provider  Requested Prescriptions  Pending Prescriptions Disp Refills   spironolactone (ALDACTONE) 25 MG tablet [Pharmacy Med Name: spironolactone 25 mg tablet] 30 tablet 2    Sig: TAKE ONE TABLET BY MOUTH ONCE DAILY     Cardiovascular: Diuretics - Aldosterone Antagonist Failed - 08/08/2022  1:11 PM      Failed - Cr in normal range and within 180 days    Creat  Date Value Ref Range Status  09/16/2015 1.49 (H) 0.50 - 1.05 mg/dL Final   Creatinine, Ser  Date Value Ref Range Status  04/13/2022 1.12 (H) 0.44 - 1.00 mg/dL Final   Creatinine,U  Date Value Ref Range Status  08/05/2009 115.4 mg/dL Final    Comment:    See lab report for associated comment(s)         Failed - Last BP in normal range    BP Readings from Last 1 Encounters:  08/04/22 (!) 159/90         Passed - K in normal range and within 180 days    Potassium  Date Value Ref Range Status  04/13/2022 4.5 3.5 - 5.1 mmol/L Final         Passed - Na in normal range and within 180 days    Sodium  Date Value Ref Range Status  04/13/2022 138 135 - 145 mmol/L Final  11/27/2018 143 134 - 144 mmol/L Final         Passed - eGFR is 30 or above and within 180 days    GFR, Est African American  Date Value Ref Range Status  03/31/2014 62 mL/min Final   GFR calc Af Amer  Date Value Ref Range Status  09/03/2019 >60 >60 mL/min Final   GFR, Est Non African American  Date Value Ref Range Status  03/31/2014 54 (L) mL/min Final    Comment:      The estimated GFR is a calculation valid for adults (>=16 years old) that uses the CKD-EPI algorithm to adjust for age and sex. It  is   not to be used for children, pregnant women, hospitalized patients,    patients on dialysis, or with rapidly changing kidney function. According to the NKDEP, eGFR >89 is normal, 60-89 shows mild impairment, 30-59 shows moderate impairment, 15-29 shows severe impairment and <15 is ESRD.     GFR, Estimated  Date Value Ref Range Status  04/13/2022 57 (L) >60 mL/min Final    Comment:    (NOTE) Calculated using the CKD-EPI Creatinine Equation (2021)          Passed - Valid encounter within last 6 months    Recent Outpatient Visits           4 days ago Vaginal itching   [place] [name], NP   2 months ago Pr[place]itus   [place] Juluis Mire P, NP   7 months ago Asthmatic bronchitis with exacerbation, moderate persistent   [place]  [facility] [name], NP   9 months ago Type 2 diabetes mellitus without complication, without long-term current use of insulin ([place])   [employer] [name], NP   1 year ago Type 2 diabetes mellitus without complication, without long-term current use of insulin ([place])   [place] [facility] [name], NP       Future Appointments             In 3 weeks Oletta Lamas, Milford Cage, NP [facility]             carvedilol (COREG) 3.125 MG tablet 60 tablet 2    Sig: Take 1 tablet (3.125 mg total) by mouth 2 (two) times daily with a meal.     There is no refill protocol information for this order     furosemide (LASIX) 40 MG tablet 60 tablet 2    Sig: Take 1 tablet (40 mg total) by mouth 2 (two) times daily.     There is no refill protocol information for this order     dapagliflozin propanediol (FARXIGA) 10 MG TABS tablet 30 tablet 2    Sig: Take 1 tablet (10 mg total) by mouth daily.     There is no refill protocol information for this order       Requested Prescriptions   Pending Prescriptions Disp Refills   spironolactone (ALDACTONE) 25 MG tablet [Pharmacy Med Name: spironolactone 25 mg tablet] 30 tablet 2    Sig: TAKE ONE TABLET BY MOUTH ONCE DAILY     Cardiovascular: Diuretics - Aldosterone Antagonist Failed - 08/08/2022  1:11 PM      Failed - Cr in normal range and within 180 days    Creat  Date Value Ref Range Status  09/16/2015 1.49 (H) 0.50 - 1.05 mg/dL Final   Creatinine, Ser  Date Value Ref Range Status  04/13/2022 1.12 (H) 0.44 - 1.00 mg/dL Final   Creatinine,U  Date Value Ref Range Status  08/05/2009 115.4 mg/dL Final    Comment:    See lab report for associated comment(s)         Failed - Last BP in normal range    BP Readings from Last 1 Encounters:  08/04/22 (!) 159/90         Passed - K in normal range and within 180 days    Potassium  Date Value Ref Range Status  04/13/2022 4.5 3.5 - 5.1 mmol/L Final         Passed - Na in normal range and within 180 days    Sodium  Date Value Ref Range Status  04/13/2022 138 135 - 145 mmol/L Final  11/27/2018 143 134 - 144 mmol/L Final         Passed - eGFR is 30 or above and within 180 days    GFR, Est African American  Date Value Ref Range Status  03/31/2014 62 mL/min Final   GFR calc Af Amer  Date Value Ref Range Status  09/03/2019 >60 >60 mL/min Final   GFR, Est Non African American  Date Value Ref Range Status  03/31/2014 54 (L) mL/min Final    Comment:      The estimated GFR is a calculation valid for adults (>=71 years old) that uses the CKD-EPI algorithm to adjust for age and sex. It is   not to be used for children, pregnant women, hospitalized patients,    patients on dialysis, or  with rapidly changing kidney function. According to the NKDEP, eGFR >89 is normal, 60-89 shows mild impairment, 30-59 shows moderate impairment, 15-29 shows severe impairment and <15 is ESRD.     GFR, Estimated  Date Value Ref Range Status  04/13/2022 57 (L) >60 mL/min Final     Comment:    (NOTE) Calculated using the CKD-EPI Creatinine Equation (2021)          Passed - Valid encounter within last 6 months    Recent Outpatient Visits           4 days ago Vaginal itching   [place] [name], NP   2 months ago Pruritus   [place] Juluis Mire P, NP   7 months ago Asthmatic bronchitis with exacerbation, moderate persistent   [facility] Juluis Mire P, NP   9 months ago Type 2 diabetes mellitus without complication, without long-term current use of insulin ([place])   [place] [name], NP   1 year ago Type 2 diabetes mellitus without complication, without long-term current use of insulin ([place])   [place] [facility] [name], NP       Future Appointments             In 3 weeks Oletta Lamas, Milford Cage, NP [facility]             carvedilol (COREG) 3.125 MG tablet 60 tablet 2    Sig: Take 1 tablet (3.125 mg total) by mouth 2 (two) times daily with a meal.     There is no refill protocol information for this order     furosemide (LASIX) 40 MG tablet 60 tablet 2    Sig: Take 1 tablet (40 mg total) by mouth 2 (two) times daily.     There is no refill protocol information for this order     dapagliflozin propanediol (FARXIGA) 10 MG TABS tablet 30 tablet 2    Sig: Take 1 tablet (10 mg total) by mouth daily.     There is no refill protocol information for this order

## 2022-08-08 NOTE — Telephone Encounter (Signed)
Stacey Lin called from the pharmacy requesting these as well  carvedilol (COREG) 3.125 MG tablet  furosemide (LASIX) 40 MG tablet  [name]

## 2022-08-09 ENCOUNTER — Other Ambulatory Visit (INDEPENDENT_AMBULATORY_CARE_PROVIDER_SITE_OTHER): Payer: Self-pay | Admitting: Primary Care

## 2022-08-09 MED ORDER — BORIC ACID VAGINAL 600 MG VA SUPP: 1.0000 | Freq: Two times a day (BID) | VAGINAL | 0 refills | Status: DC

## 2022-08-09 NOTE — Telephone Encounter (Signed)
Requested Prescriptions  Pending Prescriptions Disp Refills  . FARXIGA 10 MG TABS tablet [Pharmacy Med Name: Wilder Glade 10 mg tablet] 5 tablet 0    Sig: TAKE ONE TABLET BY MOUTH ONCE DAILY     Endocrinology:  Diabetes - SGLT2 Inhibitors Failed - 08/09/2022 12:59 PM      Failed - Cr in normal range and within 360 days    Creat  Date Value Ref Range Status  09/16/2015 1.49 (H) 0.50 - 1.05 mg/dL Final   Creatinine, Ser  Date Value Ref Range Status  04/13/2022 1.12 (H) 0.44 - 1.00 mg/dL Final   Creatinine,U  Date Value Ref Range Status  08/05/2009 115.4 mg/dL Final    Comment:    See lab report for associated comment(s)         Failed - eGFR in normal range and within 360 days    GFR, Est African American  Date Value Ref Range Status  03/31/2014 62 mL/min Final   GFR calc Af Amer  Date Value Ref Range Status  09/03/2019 >60 >60 mL/min Final   GFR, Est Non African American  Date Value Ref Range Status  03/31/2014 54 (L) mL/min Final    Comment:      The estimated GFR is a calculation valid for adults (>=50 years old) that uses the CKD-EPI algorithm to adjust for age and sex. It is   not to be used for children, pregnant women, hospitalized patients,    patients on dialysis, or with rapidly changing kidney function. According to the NKDEP, eGFR >89 is normal, 60-89 shows mild impairment, 30-59 shows moderate impairment, 15-29 shows severe impairment and <15 is ESRD.     GFR, Estimated  Date Value Ref Range Status  04/13/2022 57 (L) >60 mL/min Final    Comment:    (NOTE) Calculated using the CKD-EPI Creatinine Equation (2021)          Passed - HBA1C is between 0 and 7.9 and within 180 days    HbA1c, POC (controlled diabetic range)  Date Value Ref Range Status  08/04/2022 6.1 0.0 - 7.0 % Final         Passed - Valid encounter within last 6 months    Recent Outpatient Visits          5 days ago Vaginal itching   [place] [name], NP   2 months ago Pruritus   [place], Michelle P, NP   7 months ago Asthmatic bronchitis with exacerbation, moderate persistent   [facility] [place], Michelle P, NP   9 months ago Type 2 diabetes mellitus without complication, without long-term current use of insulin ([name])   [place] [facility] Juluis Mire P, NP   1 year ago Type 2 diabetes mellitus without complication, without long-term current use of insulin ([place])   [place] [facility] [name], NP      Future Appointments            In 3 weeks Oletta Lamas, Milford Cage, NP [place]           . spironolactone (ALDACTONE) 25 MG tablet [Pharmacy Med Name: spironolactone 25 mg tablet] 5 tablet 0    Sig: TAKE ONE TABLET BY MOUTH ONCE DAILY     Cardiovascular: Diuretics - Aldosterone Antagonist Failed - 08/09/2022 12:59 PM      Failed - Cr in normal range and  within 180 days    Creat  Date Value Ref Range Status  09/16/2015 1.49 (H) 0.50 - 1.05 mg/dL Final   Creatinine, Ser  Date Value Ref Range Status  04/13/2022 1.12 (H) 0.44 - 1.00 mg/dL Final   Creatinine,U  Date Value Ref Range Status  08/05/2009 115.4 mg/dL Final    Comment:    See lab report for associated comment(s)         Failed - Last BP in normal range    BP Readings from Last 1 Encounters:  08/04/22 (!) 159/90         Passed - K in normal range and within 180 days    Potassium  Date Value Ref Range Status  04/13/2022 4.5 3.5 - 5.1 mmol/L Final         Passed - Na in normal range and within 180 days    Sodium  Date Value Ref Range Status  04/13/2022 138 135 - 145 mmol/L Final  11/27/2018 143 134 - 144 mmol/L Final         Passed - eGFR is 30 or above and within 180 days    GFR, Est African American  Date Value Ref Range Status  03/31/2014 62 mL/min Final   GFR calc Af Amer  Date Value Ref Range Status  09/03/2019  >60 >60 mL/min Final   GFR, Est Non African American  Date Value Ref Range Status  03/31/2014 54 (L) mL/min Final    Comment:      The estimated GFR is a calculation valid for adults (>=39 years old) that uses the CKD-EPI algorithm to adjust for age and sex. It is   not to be used for children, pregnant women, hospitalized patients,    patients on dialysis, or with rapidly changing kidney function. According to the NKDEP, eGFR >89 is normal, 60-89 shows mild impairment, 30-59 shows moderate impairment, 15-29 shows severe impairment and <15 is ESRD.     GFR, Estimated  Date Value Ref Range Status  04/13/2022 57 (L) >60 mL/min Final    Comment:    (NOTE) Calculated using the CKD-EPI Creatinine Equation (2021)          Passed - Valid encounter within last 6 months    Recent Outpatient Visits          5 days ago Vaginal itching   [place] [name], NP   2 months ago Pruritus   [place] Juluis Mire P, NP   7 months ago Asthmatic bronchitis with exacerbation, moderate persistent   [place] [facility] Juluis Mire P, NP   9 months ago Type 2 diabetes mellitus without complication, without long-term current use of insulin ([place])   [place] [facility] Juluis Mire P, NP   1 year ago Type 2 diabetes mellitus without complication, without long-term current use of insulin ([place])   [place] [facility] [name], NP      Future Appointments            In 3 weeks Oletta Lamas, Milford Cage, NP [place]

## 2022-08-12 NOTE — Telephone Encounter (Signed)
Will forward to provider  

## 2022-08-22 ENCOUNTER — Telehealth (INDEPENDENT_AMBULATORY_CARE_PROVIDER_SITE_OTHER): Payer: Self-pay | Admitting: Primary Care

## 2022-08-22 ENCOUNTER — Other Ambulatory Visit (INDEPENDENT_AMBULATORY_CARE_PROVIDER_SITE_OTHER): Payer: Self-pay | Admitting: Primary Care

## 2022-08-22 MED ORDER — BORIC ACID VAGINAL 600 MG VA SUPP: 1.0000 | Freq: Two times a day (BID) | VAGINAL | 0 refills | Status: DC

## 2022-08-22 NOTE — Telephone Encounter (Signed)
Will forward to provider  

## 2022-08-22 NOTE — Telephone Encounter (Signed)
sent 

## 2022-08-22 NOTE — Telephone Encounter (Signed)
Pt is calling to ask Sharyn Lull if she can send in [facility]. Please advise [phone]

## 2022-08-25 ENCOUNTER — Emergency Department (HOSPITAL_COMMUNITY): Payer: Medicare Other

## 2022-08-25 ENCOUNTER — Encounter (HOSPITAL_COMMUNITY): Payer: Self-pay

## 2022-08-25 ENCOUNTER — Other Ambulatory Visit: Payer: Self-pay

## 2022-08-25 ENCOUNTER — Emergency Department (HOSPITAL_COMMUNITY)
Admission: EM | Admit: 2022-08-25 | Discharge: 2022-08-25 | Disposition: A | Discharge status: Home / Self Care | Payer: Medicare Other | Attending: Emergency Medicine | Admitting: Emergency Medicine

## 2022-08-25 DIAGNOSIS — M545 Low back pain, unspecified: Secondary | ICD-10-CM | POA: Insufficient documentation

## 2022-08-25 MED ORDER — FUROSEMIDE 40 MG PO TABS: 40.0000 mg | ORAL_TABLET | Freq: Two times a day (BID) | ORAL | 6 refills | Status: DC

## 2022-08-25 MED ORDER — LIDOCAINE 5 % EX PTCH: 1.0000 | MEDICATED_PATCH | CUTANEOUS | 0 refills | Status: DC

## 2022-08-25 MED ORDER — DAPAGLIFLOZIN PROPANEDIOL 10 MG PO TABS: 10.0000 mg | ORAL_TABLET | Freq: Every day | ORAL | 11 refills | Status: DC

## 2022-08-25 MED ORDER — TIZANIDINE HCL 2 MG PO TABS: 2.0000 mg | ORAL_TABLET | Freq: Three times a day (TID) | ORAL | 0 refills | Status: DC | PRN

## 2022-08-25 MED ORDER — CARVEDILOL 3.125 MG PO TABS: 3.1250 mg | ORAL_TABLET | Freq: Two times a day (BID) | ORAL | 6 refills | Status: DC

## 2022-08-25 MED ORDER — LIDOCAINE 5 % EX PTCH
1.0000 | MEDICATED_PATCH | CUTANEOUS | Status: DC
  Administered 2022-08-25: 1 via TRANSDERMAL
  Filled 2022-08-25: qty 1

## 2022-08-25 MED ORDER — TIZANIDINE HCL 4 MG PO TABS: 2.0000 mg | ORAL_TABLET | Freq: Once | ORAL | Status: DC
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
## 2022-08-25 NOTE — ED Notes (Signed)
Dr. Rees at bedside  
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
## 2022-09-01 ENCOUNTER — Ambulatory Visit (INDEPENDENT_AMBULATORY_CARE_PROVIDER_SITE_OTHER): Payer: Medicare Other | Admitting: Primary Care

## 2022-09-02 ENCOUNTER — Ambulatory Visit (INDEPENDENT_AMBULATORY_CARE_PROVIDER_SITE_OTHER): Payer: Medicare Other | Admitting: Primary Care

## 2022-09-21 ENCOUNTER — Other Ambulatory Visit (INDEPENDENT_AMBULATORY_CARE_PROVIDER_SITE_OTHER): Payer: Self-pay | Admitting: Primary Care

## 2022-09-21 NOTE — Telephone Encounter (Signed)
Unable to refill per protocol, request is too soon. Last refill 08/04/22 for 30 and 2 RF. Will refuse.  Requested Prescriptions  Pending Prescriptions Disp Refills   losartan (COZAAR) 25 MG tablet [Pharmacy Med Name: losartan 25 mg tablet] 30 tablet 2    Sig: TAKE ONE TABLET BY MOUTH ONCE DAILY     Cardiovascular:  Angiotensin Receptor Blockers Failed - 09/21/2022  8:04 AM      Failed - Cr in normal range and within 180 days    Creat  Date Value Ref Range Status  09/16/2015 1.49 (H) 0.50 - 1.05 mg/dL Final   Creatinine, Ser  Date Value Ref Range Status  04/13/2022 1.12 (H) 0.44 - 1.00 mg/dL Final   Creatinine,U  Date Value Ref Range Status  08/05/2009 115.4 mg/dL Final    Comment:    See lab report for associated comment(s)         Passed - K in normal range and within 180 days    Potassium  Date Value Ref Range Status  04/13/2022 4.5 3.5 - 5.1 mmol/L Final         Passed - Patient is not pregnant      Passed - Last BP in normal range    BP Readings from Last 1 Encounters:  08/25/22 120/78         Passed - Valid encounter within last 6 months    Recent Outpatient Visits           1 month ago Vaginal itching   [place] [name], NP   4 months ago Pruritus   [place], Michelle P, NP   9 months ago Asthmatic bronchitis with exacerbation, moderate persistent   [place] [facility] Juluis Mire P, NP   10 months ago Type 2 diabetes mellitus without complication, without long-term current use of insulin ([place])   [place] [facility] [name], NP   1 year ago Type 2 diabetes mellitus without complication, without long-term current use of insulin ([place])   [place] [name], NP

## 2022-09-28 ENCOUNTER — Telehealth (INDEPENDENT_AMBULATORY_CARE_PROVIDER_SITE_OTHER): Payer: Self-pay | Admitting: Primary Care

## 2022-09-28 NOTE — Telephone Encounter (Signed)
Last RF 08/04/22 #30 2 RF too soon  Requested Prescriptions  Refused Prescriptions Disp Refills   losartan (COZAAR) 25 MG tablet [Pharmacy Med Name: losartan 25 mg tablet] 30 tablet 2    Sig: TAKE ONE TABLET BY MOUTH ONCE DAILY     Cardiovascular:  Angiotensin Receptor Blockers Failed - 09/28/2022 11:19 AM      Failed - Cr in normal range and within 180 days    Creat  Date Value Ref Range Status  09/16/2015 1.49 (H) 0.50 - 1.05 mg/dL Final   Creatinine, Ser  Date Value Ref Range Status  04/13/2022 1.12 (H) 0.44 - 1.00 mg/dL Final   Creatinine,U  Date Value Ref Range Status  08/05/2009 115.4 mg/dL Final    Comment:    See lab report for associated comment(s)         Passed - K in normal range and within 180 days    Potassium  Date Value Ref Range Status  04/13/2022 4.5 3.5 - 5.1 mmol/L Final         Passed - Patient is not pregnant      Passed - Last BP in normal range    BP Readings from Last 1 Encounters:  08/25/22 120/78         Passed - Valid encounter within last 6 months    Recent Outpatient Visits           1 month ago Vaginal itching   [place] [name], NP   4 months ago Pruritus   [place], Michelle P, NP   9 months ago Asthmatic bronchitis with exacerbation, moderate persistent   [place] [facility] Juluis Mire P, NP   10 months ago Type 2 diabetes mellitus without complication, without long-term current use of insulin ([place])   [place] [facility] [name], NP   1 year ago Type 2 diabetes mellitus without complication, without long-term current use of insulin ([place])   [place] [name], NP

## 2022-10-04 NOTE — Telephone Encounter (Signed)
Pharmacy states they did a partial fill on 08-08-2022 for a 5 day supply of losartan 25 mg tablet  Pharmacy states there is only 25 tablets left on Rx and pt receives a pre package 30 day  Please assist further

## 2022-10-04 NOTE — Telephone Encounter (Signed)
Returned pt call was unable to lvm.

## 2022-11-01 ENCOUNTER — Other Ambulatory Visit (INDEPENDENT_AMBULATORY_CARE_PROVIDER_SITE_OTHER): Payer: Self-pay | Admitting: Primary Care

## 2022-11-01 NOTE — Telephone Encounter (Signed)
Requested medication (s) are due for refill today: yes  Requested medication (s) are on the active medication list: yes  Last refill:  08/04/22 #30/2  Future visit scheduled: no  Notes to clinic:  pt is due for updated labs, tried to call pt, VM not set up, sent mychart message. Pharmacy needs refill by 15th for delivery on 11/09/22. Please advise      Requested Prescriptions  Pending Prescriptions Disp Refills   losartan (COZAAR) 25 MG tablet [Pharmacy Med Name: losartan 25 mg tablet] 35 tablet 2    Sig: TAKE ONE TABLET BY MOUTH ONCE DAILY     Cardiovascular:  Angiotensin Receptor Blockers Failed - 11/01/2022  9:21 AM      Failed - Cr in normal range and within 180 days    Creat  Date Value Ref Range Status  09/16/2015 1.49 (H) 0.50 - 1.05 mg/dL Final   Creatinine, Ser  Date Value Ref Range Status  04/13/2022 1.12 (H) 0.44 - 1.00 mg/dL Final   Creatinine,U  Date Value Ref Range Status  08/05/2009 115.4 mg/dL Final    Comment:    See lab report for associated comment(s)         Failed - K in normal range and within 180 days    Potassium  Date Value Ref Range Status  04/13/2022 4.5 3.5 - 5.1 mmol/L Final         Passed - Patient is not pregnant      Passed - Last BP in normal range    BP Readings from Last 1 Encounters:  08/25/22 120/78         Passed - Valid encounter within last 6 months    Recent Outpatient Visits           2 months ago Vaginal itching   [place] [name], NP   5 months ago Pruritus   [place] Juluis Mire P, NP   10 months ago Asthmatic bronchitis with exacerbation, moderate persistent   [place] [facility] Juluis Mire P, NP   12 months ago Type 2 diabetes mellitus without complication, without long-term current use of insulin ([place])   [place] [facility] [name], NP   1 year ago Type 2 diabetes mellitus without complication,  without long-term current use of insulin ([place])   [facility] [name], NP

## 2022-11-01 NOTE — Telephone Encounter (Signed)
[facility] called, spoke with Desiree Lucy, Rx Tech who states pt is out of refills for losartan. Pt gets delivery on 11/09/22 so asking if rx can be sent in prior to that date would be great since pt gets package meds. Advised would call pt to schedule appt and can hopefully send in rx. No further assistance needed.

## 2022-11-09 ENCOUNTER — Ambulatory Visit (INDEPENDENT_AMBULATORY_CARE_PROVIDER_SITE_OTHER): Payer: Medicare Other | Admitting: Primary Care

## 2022-11-14 ENCOUNTER — Ambulatory Visit (INDEPENDENT_AMBULATORY_CARE_PROVIDER_SITE_OTHER): Payer: Medicare Other | Admitting: Primary Care

## 2022-11-22 ENCOUNTER — Ambulatory Visit (INDEPENDENT_AMBULATORY_CARE_PROVIDER_SITE_OTHER): Payer: Medicare Other | Admitting: Podiatry

## 2022-11-22 DIAGNOSIS — Z91199 Patient's noncompliance with other medical treatment and regimen due to unspecified reason: Secondary | ICD-10-CM

## 2022-11-22 NOTE — Progress Notes (Signed)
1. No-show for appointment

## 2022-12-01 ENCOUNTER — Ambulatory Visit (INDEPENDENT_AMBULATORY_CARE_PROVIDER_SITE_OTHER): Payer: Medicare Other | Admitting: Primary Care

## 2022-12-05 ENCOUNTER — Ambulatory Visit (INDEPENDENT_AMBULATORY_CARE_PROVIDER_SITE_OTHER): Payer: Medicare Other | Admitting: Primary Care

## 2022-12-05 ENCOUNTER — Other Ambulatory Visit (INDEPENDENT_AMBULATORY_CARE_PROVIDER_SITE_OTHER): Payer: Self-pay | Admitting: Primary Care

## 2022-12-06 ENCOUNTER — Telehealth: Payer: Self-pay | Admitting: Primary Care

## 2022-12-06 LAB — COLOGUARD: Cologuard: NEGATIVE

## 2022-12-06 NOTE — Telephone Encounter (Signed)
Called [facility]it[facility] Gauze to inform patient did not keep her schedule appt.

## 2022-12-06 NOTE — Telephone Encounter (Signed)
Will forward to provider  

## 2022-12-06 NOTE — Telephone Encounter (Signed)
Requested medication (s) are due for refill today: expired medication  Requested medication (s) are on the active medication list: yes   Last refill:  08/04/22-11/02/22 #30 2 refills  Future visit scheduled: no   Notes to clinic:  expired medication protocol failed last labs 04/13/22. Do you want to renew Rx?     Requested Prescriptions  Pending Prescriptions Disp Refills   losartan (COZAAR) 25 MG tablet [Pharmacy Med Name: losartan 25 mg tablet] 35 tablet 2    Sig: TAKE ONE TABLET BY MOUTH ONCE DAILY     Cardiovascular:  Angiotensin Receptor Blockers Failed - 12/05/2022  4:28 PM      Failed - Cr in normal range and within 180 days    Creat  Date Value Ref Range Status  09/16/2015 1.49 (H) 0.50 - 1.05 mg/dL Final   Creatinine, Ser  Date Value Ref Range Status  04/13/2022 1.12 (H) 0.44 - 1.00 mg/dL Final   Creatinine,U  Date Value Ref Range Status  08/05/2009 115.4 mg/dL Final    Comment:    See lab report for associated comment(s)         Failed - K in normal range and within 180 days    Potassium  Date Value Ref Range Status  04/13/2022 4.5 3.5 - 5.1 mmol/L Final         Passed - Patient is not pregnant      Passed - Last BP in normal range    BP Readings from Last 1 Encounters:  08/25/22 120/78         Passed - Valid encounter within last 6 months    Recent Outpatient Visits           4 months ago Vaginal itching   [place], Florin, NP   6 months ago Pruritus   [place], Michelle P, NP   11 months ago Asthmatic bronchitis with exacerbation, moderate persistent   [place] [facility] [name], NP   1 year ago Type 2 diabetes mellitus without complication, without long-term current use of insulin ([place])   [place], Michelle P, NP   1 year ago Type 2 diabetes mellitus without complication, without long-term  current use of insulin [facility])   [place] [name], NP

## 2022-12-06 NOTE — Telephone Encounter (Signed)
Copied from [place] [id number]. Topic: General - Inquiry >> Dec 06, 2022  8:52 AM Ludger Nutting wrote: Chassidy with [facility] called to confirm if patient was seen since last refill request for losartan (COZAAR) 25 MG tablet. Patient was denied refill in January because she needed an ov. Patient was scheduled for 2/12. Unsure if patient showed to the appointment. Chassidy is waiting for approval/denial of medication refill, patient has other medications being sent out on the 15th. Please advise.

## 2022-12-16 ENCOUNTER — Other Ambulatory Visit (INDEPENDENT_AMBULATORY_CARE_PROVIDER_SITE_OTHER): Payer: Self-pay

## 2022-12-19 ENCOUNTER — Ambulatory Visit (INDEPENDENT_AMBULATORY_CARE_PROVIDER_SITE_OTHER): Payer: Self-pay
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
## 2022-12-30 ENCOUNTER — Other Ambulatory Visit (INDEPENDENT_AMBULATORY_CARE_PROVIDER_SITE_OTHER): Payer: Self-pay | Admitting: Primary Care

## 2022-12-30 DIAGNOSIS — Z760 Encounter for issue of repeat prescription: Secondary | ICD-10-CM

## 2022-12-30 DIAGNOSIS — L299 Pruritus, unspecified: Secondary | ICD-10-CM

## 2022-12-30 DIAGNOSIS — J4541 Moderate persistent asthma with (acute) exacerbation: Secondary | ICD-10-CM

## 2022-12-30 DIAGNOSIS — R4589 Other symptoms and signs involving emotional state: Secondary | ICD-10-CM

## 2023-01-02 NOTE — Telephone Encounter (Signed)
Called pharmacy to report error on refills . Extended wait time unable to continue to wait for pharmacy staff to report not to refill medications until PCP reviews and approves.

## 2023-01-02 NOTE — Telephone Encounter (Signed)
Requested medication (s) are due for refill today: yes   Requested medication (s) are on the active medication list: yes   Last refill:  hydroxizine- 05/13/22 #30 0 refills, losartan- 08/04/22 - 11/02/22 #30 2 refills, lexapro and metformin- 08/04/22 #90 1 refills  Future visit scheduled: no   Notes to clinic:  losartan expired medication. No refills remain on hydroxizine, last refill doc. For lexapro and metformin 12/05/22. Do you want to refill Rxs?     Requested Prescriptions  Pending Prescriptions Disp Refills   metFORMIN (GLUCOPHAGE-XR) 500 MG 24 hr tablet 90 tablet 5    Sig: TAKE ONE TABLET BY MOUTH ONCE DAILY     Endocrinology:  Diabetes - Biguanides Failed - 01/02/2023  9:38 AM      Failed - Cr in normal range and within 360 days    Creat  Date Value Ref Range Status  09/16/2015 1.49 (H) 0.50 - 1.05 mg/dL Final   Creatinine, Ser  Date Value Ref Range Status  04/13/2022 1.12 (H) 0.44 - 1.00 mg/dL Final   Creatinine,U  Date Value Ref Range Status  08/05/2009 115.4 mg/dL Final    Comment:    See lab report for associated comment(s)         Failed - eGFR in normal range and within 360 days    GFR, Est African American  Date Value Ref Range Status  03/31/2014 62 mL/min Final   GFR calc Af Amer  Date Value Ref Range Status  09/03/2019 >60 >60 mL/min Final   GFR, Est Non African American  Date Value Ref Range Status  03/31/2014 54 (L) mL/min Final    Comment:      The estimated GFR is a calculation valid for adults (>=87 years old) that uses the CKD-EPI algorithm to adjust for age and sex. It is   not to be used for children, pregnant women, hospitalized patients,    patients on dialysis, or with rapidly changing kidney function. According to the NKDEP, eGFR >89 is normal, 60-89 shows mild impairment, 30-59 shows moderate impairment, 15-29 shows severe impairment and <15 is ESRD.     GFR, Estimated  Date Value Ref Range Status  04/13/2022 57 (L) >60 mL/min  Final    Comment:    (NOTE) Calculated using the CKD-EPI Creatinine Equation (2021)          Failed - B12 Level in normal range and within 720 days    Vitamin B-12  Date Value Ref Range Status  08/19/2013 741 211 - 911 pg/mL Final    Comment:    Performed at [place] - HBA1C is between 0 and 7.9 and within 180 days    HbA1c, POC (controlled diabetic range)  Date Value Ref Range Status  08/04/2022 6.1 0.0 - 7.0 % Final         Passed - Valid encounter within last 6 months    Recent Outpatient Visits           5 months ago Vaginal itching   [place], Michelle P, NP   7 months ago Pruritus   [place], Michelle P, NP   1 year ago Asthmatic bronchitis with exacerbation, moderate persistent   [place] [facility] [name], NP   1 year ago Type 2 diabetes mellitus without complication, without long-term current use of insulin ([place])   [place] [facility] [name], NP   1 year ago Type 2 diabetes mellitus without complication, without long-term current use of insulin ([place])   [place] [facility] [name], NP              Passed - CBC within normal limits and completed in the last 12 months    WBC  Date Value Ref Range Status  04/13/2022 9.6 4.0 - 10.5 K/uL Final   RBC  Date Value Ref Range Status  04/13/2022 4.87 3.87 - 5.11 MIL/uL Final   Hemoglobin  Date Value Ref Range Status  04/13/2022 15.1 (H) 12.0 - 15.0 g/dL Final  11/27/2018 11.2 11.1 - 15.9 g/dL Final   Total hemoglobin  Date Value Ref Range Status  04/07/2016 13.5 12.0 - 16.0 g/dL Final   HCT  Date Value Ref Range Status  04/13/2022 46.5 (H) 36.0 - 46.0 % Final   Hematocrit  Date Value Ref Range Status  11/27/2018 33.3 (L) 34.0 - 46.6 % Final   MCHC  Date Value Ref Range Status  04/13/2022 32.5 30.0 -  36.0 g/dL Final   [facility]  Date Value Ref Range Status  04/13/2022 31.0 26.0 - 34.0 pg Final   MCV  Date Value Ref Range Status  04/13/2022 95.5 80.0 - 100.0 fL Final  11/27/2018 90 79 - 97 fL Final   No results found for: "PLTCOUNTKUC", "LABPLAT", "POCPLA" RDW  Date Value Ref Range Status  04/13/2022 13.1 11.5 - 15.5 % Final  11/27/2018 12.4 11.7 - 15.4 % Final          losartan (COZAAR) 25 MG tablet 35 tablet 5    Sig: Take 1 tablet (25 mg total) by mouth daily.     Cardiovascular:  Angiotensin Receptor Blockers Failed - 01/02/2023  9:38 AM      Failed - Cr in normal range and within 180 days    Creat  Date Value Ref Range Status  09/16/2015 1.49 (H) 0.50 - 1.05 mg/dL Final   Creatinine, Ser  Date Value Ref Range Status  04/13/2022 1.12 (H) 0.44 - 1.00 mg/dL Final   Creatinine,U  Date Value Ref Range Status  08/05/2009 115.4 mg/dL Final    Comment:    See lab report for associated comment(s)         Failed - K in normal range and within 180 days    Potassium  Date Value Ref Range Status  04/13/2022 4.5 3.5 - 5.1 mmol/L Final         Passed - Patient is not pregnant      Passed - Last BP in normal range    BP Readings from Last 1 Encounters:  08/25/22 120/78         Passed - Valid encounter within last 6 months    Recent Outpatient Visits           5 months ago Vaginal itching   [place], [place], NP   7 months ago Pruritus   [place], Michelle P, NP   1 year ago Asthmatic bronchitis with exacerbation, moderate persistent   [place] [facility] [name], NP   1 year ago Type 2 diabetes mellitus without complication, without long-term current use of insulin ([place])   [place], Michelle P, NP   1 year ago Type 2 diabetes mellitus without complication, without long-term current use of insulin (  [place])   [place], Michelle P, NP               hydrOXYzine (ATARAX) 10 MG tablet 30 tablet 0    Sig: Take 1 tablet (10 mg total) by mouth 3 (three) times daily as needed.     Ear, Nose, and Throat:  Antihistamines 2 Failed - 01/02/2023  9:38 AM      Failed - Cr in normal range and within 360 days    Creat  Date Value Ref Range Status  09/16/2015 1.49 (H) 0.50 - 1.05 mg/dL Final   Creatinine, Ser  Date Value Ref Range Status  04/13/2022 1.12 (H) 0.44 - 1.00 mg/dL Final   Creatinine,U  Date Value Ref Range Status  08/05/2009 115.4 mg/dL Final    Comment:    See lab report for associated comment(s)         Passed - Valid encounter within last 12 months    Recent Outpatient Visits           5 months ago Vaginal itching   [place], Michelle P, NP   7 months ago Pruritus   [place], Michelle P, NP   1 year ago Asthmatic bronchitis with exacerbation, moderate persistent   [place] [facility] [name], NP   1 year ago Type 2 diabetes mellitus without complication, without long-term current use of insulin ([place])   [place] [name], NP   1 year ago Type 2 diabetes mellitus without complication, without long-term current use of insulin ([place])   [place], Michelle P, NP               escitalopram (LEXAPRO) 20 MG tablet 90 tablet 5    Sig: Take 1 tablet (20 mg total) by mouth daily.     Psychiatry:  Antidepressants - SSRI Passed - 01/02/2023  9:38 AM      Passed - Completed PHQ-2 or PHQ-9 in the last 360 days      Passed - Valid encounter within last 6 months    Recent Outpatient Visits           5 months ago Vaginal itching   [place], [place], NP   7 months ago Pruritus   [place] [name], NP   1 year ago Asthmatic bronchitis with exacerbation, moderate persistent   [place] [facility] [name], NP   1 year ago Type 2 diabetes mellitus without complication, without long-term current use of insulin ([place])   [place] [name], NP   1 year ago Type 2 diabetes mellitus without complication, without long-term current use of insulin ([place])   [place] [facility] [name], NP              Signed Prescriptions Disp Refills   hydrOXYzine (ATARAX) 10 MG tablet 30 tablet 0    Sig: TAKE ONE TABLET BY MOUTH THREE TIMES DAILY AS NEEDED     Ear, Nose, and Throat:  Antihistamines 2 Failed - 12/30/2022  3:49 PM      Failed - Cr in normal range and within 360 days    Creat  Date Value Ref Range Status  09/16/2015 1.49 (H) 0.50 - 1.05 mg/dL Final   Creatinine, Ser  Date Value Ref Range Status  04/13/2022 1.12 (H) 0.44 - 1.00 mg/dL Final   Creatinine,U  Date Value Ref Range Status  08/05/2009 115.4 mg/dL Final    Comment:    See lab report for associated comment(s)         Passed - Valid encounter within last 12 months    Recent Outpatient Visits           5 months ago Vaginal itching   [place], [place], NP   7 months ago Pruritus   [place] [name], NP   1 year ago Asthmatic bronchitis with exacerbation, moderate persistent   [place] [facility] [name], NP   1 year ago Type 2 diabetes mellitus without complication, without long-term current use of insulin ([place])   [place], Michelle P, NP   1 year ago Type 2 diabetes mellitus without complication, without long-term current use of insulin ([employer])   [place] Juluis Mire P, NP               montelukast (SINGULAIR) 10 MG  tablet 30 tablet 0    Sig: TAKE ONE TABLET BY MOUTH EVERYDAY AT BEDTIME     Pulmonology:  Leukotriene Inhibitors Passed - 12/30/2022  3:49 PM      Passed - Valid encounter within last 12 months    Recent Outpatient Visits           5 months ago Vaginal itching   [place], Michelle P, NP   7 months ago Pruritus   [place], Michelle P, NP   1 year ago Asthmatic bronchitis with exacerbation, moderate persistent   [place] [facility] [name], NP   1 year ago Type 2 diabetes mellitus without complication, without long-term current use of insulin ([place])   [place], Michelle P, NP   1 year ago Type 2 diabetes mellitus without complication, without long-term current use of insulin ([place])   [place], Michelle P, NP               gabapentin (NEURONTIN) 300 MG capsule 90 capsule 0    Sig: TAKE ONE CAPSULE BY MOUTH TWICE DAILY     Neurology: Anticonvulsants - gabapentin Failed - 12/30/2022  3:49 PM      Failed - Cr in normal range and within 360 days    Creat  Date Value Ref Range Status  09/16/2015 1.49 (H) 0.50 - 1.05 mg/dL Final   Creatinine, Ser  Date Value Ref Range Status  04/13/2022 1.12 (H) 0.44 - 1.00 mg/dL Final   Creatinine,U  Date Value Ref Range Status  08/05/2009 115.4 mg/dL Final    Comment:    See lab report for associated comment(s)         Passed - Completed PHQ-2 or PHQ-9 in the last 360 days      Passed - Valid encounter within last 12 months    Recent Outpatient Visits           5 months ago Vaginal itching   [place], Michelle P, NP   7 months ago Pruritus   [place], Michelle P, NP   1 year ago Asthmatic bronchitis with exacerbation, moderate persistent   [place], [place] P, NP   1 year ago Type 2 diabetes mellitus without complication, without long-term current use of insulin ([place])   [place] [name], NP   1 year ago Type 2 diabetes mellitus without complication, without long-term current use of insulin ([place])   [place] [facility] [name], NP               losartan (COZAAR) 25 MG tablet 35 tablet 5    Sig: TAKE ONE TABLET BY MOUTH ONCE DAILY     Cardiovascular:  Angiotensin Receptor Blockers Failed - 12/30/2022  3:49 PM      Failed - Cr in normal range and within 180 days    Creat  Date Value Ref Range Status  09/16/2015 1.49 (H) 0.50 - 1.05 mg/dL Final   Creatinine, Ser  Date Value Ref Range Status  04/13/2022 1.12 (H) 0.44 - 1.00 mg/dL Final   Creatinine,U  Date Value Ref Range Status  08/05/2009 115.4 mg/dL Final    Comment:    See lab report for associated comment(s)         Failed - K in normal range and within 180 days    Potassium  Date Value Ref Range Status  04/13/2022 4.5 3.5 - 5.1 mmol/L Final         Passed - Patient is not pregnant      Passed - Last BP in normal range    BP Readings from Last 1 Encounters:  08/25/22 120/78         Passed - Valid encounter within last 6 months    Recent Outpatient Visits           5 months ago Vaginal itching   [place], Michelle P, NP   7 months ago Pruritus   [place], Michelle P, NP   1 year ago Asthmatic bronchitis with exacerbation, moderate persistent   [place] [facility] [name], NP   1 year ago Type 2 diabetes mellitus without complication, without long-term current use of insulin ([place])   [place] [name], NP   1 year ago Type 2 diabetes mellitus without complication, without long-term current use of insulin ([place])   [place] [name], NP               allopurinol (ZYLOPRIM) 100 MG tablet 180 tablet 0    Sig: TAKE ONE TABLET BY MOUTH TWICE DAILY     Endocrinology:  Gout Agents - allopurinol Failed - 12/30/2022  3:49 PM      Failed - Uric Acid in normal range and within 360 days    Uric Acid  Date Value Ref Range Status  04/12/2018 8.0 (H) 2.5 - 7.1 mg/dL Final    Comment:               Therapeutic target for gout patients: <6.0         Failed - Cr in normal range and within 360 days    Creat  Date Value Ref Range Status  09/16/2015 1.49 (H) 0.50 - 1.05 mg/dL Final   Creatinine, Ser  Date Value Ref Range Status  04/13/2022 1.12 (H) 0.44 - 1.00 mg/dL Final   Creatinine,U  Date Value Ref Range Status  08/05/2009 115.4 mg/dL Final    Comment:  See lab report for associated comment(s)         Passed - Valid encounter within last 12 months    Recent Outpatient Visits           5 months ago Vaginal itching   [place], Michelle P, NP   7 months ago Pruritus   [place] [facility] [name], NP   1 year ago Asthmatic bronchitis with exacerbation, moderate persistent   [place] [facility] [name], NP   1 year ago Type 2 diabetes mellitus without complication, without long-term current use of insulin ([place])   [place] [facility] [name], NP   1 year ago Type 2 diabetes mellitus without complication, without long-term current use of insulin ([place])   [place] [facility] [name], NP              Passed - CBC within normal limits and completed in the last 12 months    WBC  Date Value Ref Range Status  04/13/2022 9.6 4.0 - 10.5 K/uL Final   RBC  Date Value Ref Range Status  04/13/2022 4.87 3.87 - 5.11 MIL/uL Final   Hemoglobin  Date Value Ref Range Status  04/13/2022 15.1 (H) 12.0 -  15.0 g/dL Final  11/27/2018 11.2 11.1 - 15.9 g/dL Final   Total hemoglobin  Date Value Ref Range Status  04/07/2016 13.5 12.0 - 16.0 g/dL Final   HCT  Date Value Ref Range Status  04/13/2022 46.5 (H) 36.0 - 46.0 % Final   Hematocrit  Date Value Ref Range Status  11/27/2018 33.3 (L) 34.0 - 46.6 % Final   MCHC  Date Value Ref Range Status  04/13/2022 32.5 30.0 - 36.0 g/dL Final   [facility]  Date Value Ref Range Status  04/13/2022 31.0 26.0 - 34.0 pg Final   MCV  Date Value Ref Range Status  04/13/2022 95.5 80.0 - 100.0 fL Final  11/27/2018 90 79 - 97 fL Final   No results found for: "PLTCOUNTKUC", "LABPLAT", "POCPLA" RDW  Date Value Ref Range Status  04/13/2022 13.1 11.5 - 15.5 % Final  11/27/2018 12.4 11.7 - 15.4 % Final          escitalopram (LEXAPRO) 20 MG tablet 90 tablet 5    Sig: TAKE ONE TABLET BY MOUTH ONCE DAILY     Psychiatry:  Antidepressants - SSRI Passed - 12/30/2022  3:49 PM      Passed - Completed PHQ-2 or PHQ-9 in the last 360 days      Passed - Valid encounter within last 6 months    Recent Outpatient Visits           5 months ago Vaginal itching   [place], Michelle P, NP   7 months ago Pruritus   [place], Michelle P, NP   1 year ago Asthmatic bronchitis with exacerbation, moderate persistent   [place] [facility] [name], NP   1 year ago Type 2 diabetes mellitus without complication, without long-term current use of insulin ([place])   [place] [facility] [name], NP   1 year ago Type 2 diabetes mellitus without complication, without long-term current use of insulin ([place])   [place] [facility] [name], NP               metFORMIN (  GLUCOPHAGE-XR) 500 MG 24 hr tablet 90 tablet 5    Sig: TAKE ONE TABLET BY MOUTH ONCE DAILY     Endocrinology:  Diabetes - Biguanides Failed -  12/30/2022  3:49 PM      Failed - Cr in normal range and within 360 days    Creat  Date Value Ref Range Status  09/16/2015 1.49 (H) 0.50 - 1.05 mg/dL Final   Creatinine, Ser  Date Value Ref Range Status  04/13/2022 1.12 (H) 0.44 - 1.00 mg/dL Final   Creatinine,U  Date Value Ref Range Status  08/05/2009 115.4 mg/dL Final    Comment:    See lab report for associated comment(s)         Failed - eGFR in normal range and within 360 days    GFR, Est African American  Date Value Ref Range Status  03/31/2014 62 mL/min Final   GFR calc Af Amer  Date Value Ref Range Status  09/03/2019 >60 >60 mL/min Final   GFR, Est Non African American  Date Value Ref Range Status  03/31/2014 54 (L) mL/min Final    Comment:      The estimated GFR is a calculation valid for adults (>=90 years old) that uses the CKD-EPI algorithm to adjust for age and sex. It is   not to be used for children, pregnant women, hospitalized patients,    patients on dialysis, or with rapidly changing kidney function. According to the NKDEP, eGFR >89 is normal, 60-89 shows mild impairment, 30-59 shows moderate impairment, 15-29 shows severe impairment and <15 is ESRD.     GFR, Estimated  Date Value Ref Range Status  04/13/2022 57 (L) >60 mL/min Final    Comment:    (NOTE) Calculated using the CKD-EPI Creatinine Equation (2021)          Failed - B12 Level in normal range and within 720 days    Vitamin B-12  Date Value Ref Range Status  08/19/2013 741 211 - 911 pg/mL Final    Comment:    Performed at [facility] - HBA1C is between 0 and 7.9 and within 180 days    HbA1c, POC (controlled diabetic range)  Date Value Ref Range Status  08/04/2022 6.1 0.0 - 7.0 % Final         Passed - Valid encounter within last 6 months    Recent Outpatient Visits           5 months ago Vaginal itching   [place], Michelle P, NP   7 months ago Pruritus    [place], Michelle P, NP   1 year ago Asthmatic bronchitis with exacerbation, moderate persistent    [facility] [name], NP   1 year ago Type 2 diabetes mellitus without complication, without long-term current use of insulin ([place])   [place] [name], NP   1 year ago Type 2 diabetes mellitus without complication, without long-term current use of insulin ([place])   [place] [name], NP              Passed - CBC within normal limits and completed in the last 12 months    WBC  Date Value Ref Range Status  04/13/2022 9.6 4.0 - 10.5 K/uL Final   RBC  Date Value Ref Range Status  04/13/2022 4.87  3.87 - 5.11 MIL/uL Final   Hemoglobin  Date Value Ref Range Status  04/13/2022 15.1 (H) 12.0 - 15.0 g/dL Final  11/27/2018 11.2 11.1 - 15.9 g/dL Final   Total hemoglobin  Date Value Ref Range Status  04/07/2016 13.5 12.0 - 16.0 g/dL Final   HCT  Date Value Ref Range Status  04/13/2022 46.5 (H) 36.0 - 46.0 % Final   Hematocrit  Date Value Ref Range Status  11/27/2018 33.3 (L) 34.0 - 46.6 % Final   MCHC  Date Value Ref Range Status  04/13/2022 32.5 30.0 - 36.0 g/dL Final   [facility]  Date Value Ref Range Status  04/13/2022 31.0 26.0 - 34.0 pg Final   MCV  Date Value Ref Range Status  04/13/2022 95.5 80.0 - 100.0 fL Final  11/27/2018 90 79 - 97 fL Final   No results found for: "PLTCOUNTKUC", "LABPLAT", "POCPLA" RDW  Date Value Ref Range Status  04/13/2022 13.1 11.5 - 15.5 % Final  11/27/2018 12.4 11.7 - 15.4 % Final

## 2023-01-02 NOTE — Addendum Note (Signed)
Addended by: Mliss Sax on: 01/02/2023 09:38 AM   Modules accepted: Orders

## 2023-01-02 NOTE — Telephone Encounter (Signed)
Last OV 5 months ago .  Requested Prescriptions  Pending Prescriptions Disp Refills   hydrOXYzine (ATARAX) 10 MG tablet [Pharmacy Med Name: hydroxyzine HCl 10 mg tablet] 30 tablet 0    Sig: TAKE ONE TABLET BY MOUTH THREE TIMES DAILY AS NEEDED     Ear, Nose, and Throat:  Antihistamines 2 Failed - 12/30/2022  3:49 PM      Failed - Cr in normal range and within 360 days    Creat  Date Value Ref Range Status  09/16/2015 1.49 (H) 0.50 - 1.05 mg/dL Final   Creatinine, Ser  Date Value Ref Range Status  04/13/2022 1.12 (H) 0.44 - 1.00 mg/dL Final   Creatinine,U  Date Value Ref Range Status  08/05/2009 115.4 mg/dL Final    Comment:    See lab report for associated comment(s)         Passed - Valid encounter within last 12 months    Recent Outpatient Visits           5 months ago Vaginal itching   [place], [place], NP   7 months ago Pruritus   [place] [name], NP   1 year ago Asthmatic bronchitis with exacerbation, moderate persistent   [place] [facility] [name], NP   1 year ago Type 2 diabetes mellitus without complication, without long-term current use of insulin ([place])   [place] [name], NP   1 year ago Type 2 diabetes mellitus without complication, without long-term current use of insulin ([place])   [place] [name], NP               montelukast (SINGULAIR) 10 MG tablet [Pharmacy Med Name: montelukast 10 mg tablet] 30 tablet 0    Sig: TAKE ONE TABLET BY MOUTH EVERYDAY AT BEDTIME     Pulmonology:  Leukotriene Inhibitors Passed - 12/30/2022  3:49 PM      Passed - Valid encounter within last 12 months    Recent Outpatient Visits           5 months ago Vaginal itching   [place], Michelle P, NP   7 months ago Pruritus   [place], Michelle P, NP   1 year ago Asthmatic bronchitis with exacerbation, moderate persistent    [facility] [name], NP   1 year ago Type 2 diabetes mellitus without complication, without long-term current use of insulin ([place])   [place] [name], NP   1 year ago Type 2 diabetes mellitus without complication, without long-term current use of insulin ([place])   [place] [name], NP               gabapentin (NEURONTIN) 300 MG capsule [Pharmacy Med Name: gabapentin 300 mg capsule] 90 capsule 0    Sig: TAKE ONE CAPSULE BY MOUTH TWICE DAILY     Neurology: Anticonvulsants - gabapentin Failed - 12/30/2022  3:49 PM      Failed - Cr in normal range and within 360 days    Creat  Date Value Ref Range Status  09/16/2015 1.49 (H) 0.50 - 1.05 mg/dL Final   Creatinine, Ser  Date Value Ref Range Status  04/13/2022 1.12 (H) 0.44 - 1.00 mg/dL Final   Creatinine,U  Date Value Ref  Range Status  08/05/2009 115.4 mg/dL Final    Comment:    See lab report for associated comment(s)         Passed - Completed PHQ-2 or PHQ-9 in the last 360 days      Passed - Valid encounter within last 12 months    Recent Outpatient Visits           5 months ago Vaginal itching   [place], [place], NP   7 months ago Pruritus   [place] [name], NP   1 year ago Asthmatic bronchitis with exacerbation, moderate persistent   [place] [facility] [name], NP   1 year ago Type 2 diabetes mellitus without complication, without long-term current use of insulin ([place])   [place], Michelle P, NP   1 year ago Type 2 diabetes mellitus without complication, without long-term current use of insulin ([place])   [place] [name], NP               losartan (COZAAR) 25 MG tablet [Pharmacy Med Name: losartan 25 mg tablet] 35 tablet 5    Sig: TAKE ONE TABLET BY MOUTH ONCE DAILY     Cardiovascular:  Angiotensin Receptor Blockers Failed - 12/30/2022  3:49 PM      Failed - Cr in normal range and within 180 days    Creat  Date Value Ref Range Status  09/16/2015 1.49 (H) 0.50 - 1.05 mg/dL Final   Creatinine, Ser  Date Value Ref Range Status  04/13/2022 1.12 (H) 0.44 - 1.00 mg/dL Final   Creatinine,U  Date Value Ref Range Status  08/05/2009 115.4 mg/dL Final    Comment:    See lab report for associated comment(s)         Failed - K in normal range and within 180 days    Potassium  Date Value Ref Range Status  04/13/2022 4.5 3.5 - 5.1 mmol/L Final         Passed - Patient is not pregnant      Passed - Last BP in normal range    BP Readings from Last 1 Encounters:  08/25/22 120/78         Passed - Valid encounter within last 6 months    Recent Outpatient Visits           5 months ago Vaginal itching   [place], Michelle P, NP   7 months ago Pruritus   [place], Michelle P, NP   1 year ago Asthmatic bronchitis with exacerbation, moderate persistent   [place] [facility] [name], NP   1 year ago Type 2 diabetes mellitus without complication, without long-term current use of insulin ([place])   [place] [name], NP   1 year ago Type 2 diabetes mellitus without complication, without long-term current use of insulin ([place])   [employer] [name], NP               allopurinol (ZYLOPRIM) 100 MG tablet [Pharmacy Med Name: allopurinol 100 mg tablet] 180 tablet 0    Sig: TAKE ONE TABLET BY MOUTH TWICE DAILY     Endocrinology:  Gout Agents - allopurinol Failed -  12/30/2022  3:49 PM      Failed -  Uric Acid in normal range and within 360 days    Uric Acid  Date Value Ref Range Status  04/12/2018 8.0 (H) 2.5 - 7.1 mg/dL Final    Comment:               Therapeutic target for gout patients: <6.0         Failed - Cr in normal range and within 360 days    Creat  Date Value Ref Range Status  09/16/2015 1.49 (H) 0.50 - 1.05 mg/dL Final   Creatinine, Ser  Date Value Ref Range Status  04/13/2022 1.12 (H) 0.44 - 1.00 mg/dL Final   Creatinine,U  Date Value Ref Range Status  08/05/2009 115.4 mg/dL Final    Comment:    See lab report for associated comment(s)         Passed - Valid encounter within last 12 months    Recent Outpatient Visits           5 months ago Vaginal itching   [place], Michelle P, NP   7 months ago Pruritus   [place], Michelle P, NP   1 year ago Asthmatic bronchitis with exacerbation, moderate persistent   [place] [facility] [name], NP   1 year ago Type 2 diabetes mellitus without complication, without long-term current use of insulin ([place])   [place], Michelle P, NP   1 year ago Type 2 diabetes mellitus without complication, without long-term current use of insulin ([place])   [place] [name], NP              Passed - CBC within normal limits and completed in the last 12 months    WBC  Date Value Ref Range Status  04/13/2022 9.6 4.0 - 10.5 K/uL Final   RBC  Date Value Ref Range Status  04/13/2022 4.87 3.87 - 5.11 MIL/uL Final   Hemoglobin  Date Value Ref Range Status  04/13/2022 15.1 (H) 12.0 - 15.0 g/dL Final  11/27/2018 11.2 11.1 - 15.9 g/dL Final   Total hemoglobin  Date Value Ref Range Status  04/07/2016 13.5 12.0 - 16.0 g/dL Final   HCT  Date Value Ref Range Status  04/13/2022 46.5 (H) 36.0 - 46.0 % Final    Hematocrit  Date Value Ref Range Status  11/27/2018 33.3 (L) 34.0 - 46.6 % Final   MCHC  Date Value Ref Range Status  04/13/2022 32.5 30.0 - 36.0 g/dL Final   [facility]  Date Value Ref Range Status  04/13/2022 31.0 26.0 - 34.0 pg Final   MCV  Date Value Ref Range Status  04/13/2022 95.5 80.0 - 100.0 fL Final  11/27/2018 90 79 - 97 fL Final   No results found for: "PLTCOUNTKUC", "LABPLAT", "POCPLA" RDW  Date Value Ref Range Status  04/13/2022 13.1 11.5 - 15.5 % Final  11/27/2018 12.4 11.7 - 15.4 % Final          escitalopram (LEXAPRO) 20 MG tablet [Pharmacy Med Name: escitalopram 20 mg tablet] 90 tablet 5    Sig: TAKE ONE TABLET BY MOUTH ONCE DAILY     Psychiatry:  Antidepressants - SSRI Passed - 12/30/2022  3:49 PM      Passed - Completed PHQ-2 or PHQ-9 in the last 360 days      Passed - Valid encounter within last 6 months    Recent Outpatient Visits  5 months ago Vaginal itching   [place] [name], NP   7 months ago Pruritus   [place] [name], NP   1 year ago Asthmatic bronchitis with exacerbation, moderate persistent   [place] [facility] [name], NP   1 year ago Type 2 diabetes mellitus without complication, without long-term current use of insulin ([place])   [place] [name], NP   1 year ago Type 2 diabetes mellitus without complication, without long-term current use of insulin ([place])   [place] [name], NP               metFORMIN (GLUCOPHAGE-XR) 500 MG 24 hr tablet [Pharmacy Med Name: metformin ER 500 mg tablet,extended release 24 hr] 90 tablet 5    Sig: TAKE ONE TABLET BY MOUTH ONCE DAILY     Endocrinology:  Diabetes - Biguanides Failed - 12/30/2022  3:49 PM      Failed - Cr in normal range and within 360 days    Creat  Date Value Ref Range Status   09/16/2015 1.49 (H) 0.50 - 1.05 mg/dL Final   Creatinine, Ser  Date Value Ref Range Status  04/13/2022 1.12 (H) 0.44 - 1.00 mg/dL Final   Creatinine,U  Date Value Ref Range Status  08/05/2009 115.4 mg/dL Final    Comment:    See lab report for associated comment(s)         Failed - eGFR in normal range and within 360 days    GFR, Est African American  Date Value Ref Range Status  03/31/2014 62 mL/min Final   GFR calc Af Amer  Date Value Ref Range Status  09/03/2019 >60 >60 mL/min Final   GFR, Est Non African American  Date Value Ref Range Status  03/31/2014 54 (L) mL/min Final    Comment:      The estimated GFR is a calculation valid for adults (>=51 years old) that uses the CKD-EPI algorithm to adjust for age and sex. It is   not to be used for children, pregnant women, hospitalized patients,    patients on dialysis, or with rapidly changing kidney function. According to the NKDEP, eGFR >89 is normal, 60-89 shows mild impairment, 30-59 shows moderate impairment, 15-29 shows severe impairment and <15 is ESRD.     GFR, Estimated  Date Value Ref Range Status  04/13/2022 57 (L) >60 mL/min Final    Comment:    (NOTE) Calculated using the CKD-EPI Creatinine Equation (2021)          Failed - B12 Level in normal range and within 720 days    Vitamin B-12  Date Value Ref Range Status  08/19/2013 741 211 - 911 pg/mL Final    Comment:    Performed at [facility] is between 0 and 7.9 and within 180 days    HbA1c, POC (controlled diabetic range)  Date Value Ref Range Status  08/04/2022 6.1 0.0 - 7.0 % Final         Passed - Valid encounter within last 6 months    Recent Outpatient Visits           5 months ago Vaginal itching   [place], Michelle P, NP   7 months ago Pruritus   [place] Juluis Mire  P, NP   1 year ago Asthmatic bronchitis with  exacerbation, moderate persistent   [place] [facility] [name], NP   1 year ago Type 2 diabetes mellitus without complication, without long-term current use of insulin ([place])   [place] [facility] [name], NP   1 year ago Type 2 diabetes mellitus without complication, without long-term current use of insulin ([place])   [place] [facility] [name], NP              Passed - CBC within normal limits and completed in the last 12 months    WBC  Date Value Ref Range Status  04/13/2022 9.6 4.0 - 10.5 K/uL Final   RBC  Date Value Ref Range Status  04/13/2022 4.87 3.87 - 5.11 MIL/uL Final   Hemoglobin  Date Value Ref Range Status  04/13/2022 15.1 (H) 12.0 - 15.0 g/dL Final  11/27/2018 11.2 11.1 - 15.9 g/dL Final   Total hemoglobin  Date Value Ref Range Status  04/07/2016 13.5 12.0 - 16.0 g/dL Final   HCT  Date Value Ref Range Status  04/13/2022 46.5 (H) 36.0 - 46.0 % Final   Hematocrit  Date Value Ref Range Status  11/27/2018 33.3 (L) 34.0 - 46.6 % Final   MCHC  Date Value Ref Range Status  04/13/2022 32.5 30.0 - 36.0 g/dL Final   [facility]  Date Value Ref Range Status  04/13/2022 31.0 26.0 - 34.0 pg Final   MCV  Date Value Ref Range Status  04/13/2022 95.5 80.0 - 100.0 fL Final  11/27/2018 90 79 - 97 fL Final   No results found for: "PLTCOUNTKUC", "LABPLAT", "POCPLA" RDW  Date Value Ref Range Status  04/13/2022 13.1 11.5 - 15.5 % Final  11/27/2018 12.4 11.7 - 15.4 % Final

## 2023-01-31 ENCOUNTER — Other Ambulatory Visit (INDEPENDENT_AMBULATORY_CARE_PROVIDER_SITE_OTHER): Payer: Self-pay | Admitting: Primary Care

## 2023-01-31 DIAGNOSIS — J4541 Moderate persistent asthma with (acute) exacerbation: Secondary | ICD-10-CM

## 2023-02-01 NOTE — Telephone Encounter (Signed)
Requested Prescriptions  Pending Prescriptions Disp Refills   gabapentin (NEURONTIN) 300 MG capsule [Pharmacy Med Name: gabapentin 300 mg capsule] 180 capsule 0    Sig: TAKE ONE CAPSULE BY MOUTH TWICE DAILY     Neurology: Anticonvulsants - gabapentin Failed - 01/31/2023 11:39 AM      Failed - Cr in normal range and within 360 days    Creat  Date Value Ref Range Status  09/16/2015 1.49 (H) 0.50 - 1.05 mg/dL Final   Creatinine, Ser  Date Value Ref Range Status  04/13/2022 1.12 (H) 0.44 - 1.00 mg/dL Final   Creatinine,U  Date Value Ref Range Status  08/05/2009 115.4 mg/dL Final    Comment:    See lab report for associated comment(s)         Passed - Completed PHQ-2 or PHQ-9 in the last 360 days      Passed - Valid encounter within last 12 months    Recent Outpatient Visits           6 months ago Vaginal itching   [place] [facility] Grayce [place], NP   8 months ago Pruritus   [place] [facility] Grayce Sessions, NP   1 year ago Asthmatic bronchitis with exacerbation, moderate persistent   [place] [facility] Grayce Sessions, NP   1 year ago Type 2 diabetes mellitus without complication, without long-term current use of insulin (H[facility])   [place] [facility] Grayce Sessions, NP   1 year ago Type 2 diabetes mellitus without complication, without long-term current use of insulin (HCC)   [place] [facility] Grayce Sessions, NP               montelukast (SINGULAIR) 10 MG tablet [Pharmacy Med Name: montelukast 10 mg tablet] 90 tablet 0    Sig: TAKE ONE TABLET BY MOUTH EVERYDAY AT BEDTIME     Pulmonology:  Leukotriene Inhibitors Passed - 01/31/2023 11:39 AM      Passed - Valid encounter within last 12 months    Recent Outpatient Visits           6 months ago Vaginal itching   [place] [facility] Grayce Sessions, NP   8 months ago  Pruritus   [place] [facility] Grayce Sessions, NP   1 year ago Asthmatic bronchitis with exacerbation, moderate persistent   [place] [facility] Grayce Sessions, NP   1 year ago Type 2 diabetes mellitus without complication, without long-term current use of insulin (HCC)   [place] [facility] Grayce Sessions, NP   1 year ago Type 2 diabetes mellitus without complication, without long-term current use of insulin [facility])   [place] [facility] Grayce Sessions, NP

## 2023-02-16 ENCOUNTER — Ambulatory Visit (INDEPENDENT_AMBULATORY_CARE_PROVIDER_SITE_OTHER): Payer: 59 | Admitting: Primary Care

## 2023-02-21 NOTE — Progress Notes (Deleted)
Subjective:    Patient ID: Stacey Lin, female    DOB: [birth year]   MRN: [id number]    Brief patient profile:  89 yobf reports quit smoking 08/2019  previously seen by Dr. Shelle Iron 12/18/14 with dyspnea on exertion.She was felt to have asthmatic bronchitis but not COPD  rec Stop spiriva rx breo 100, take one inhalation each am proair respiclick, 2 inhalations every 6 hrs only if you are having severe breathing issues. Work on weight loss and conditioning. Will send a note to your cardiologist to see about getting you off lisinopril to see if this helps your dry tickling cough. You must stop smoking 100% in order to stay well.  followup with me again in 2mos to check on things> did not return.   History of Present Illness  02/12/2016: NP Follow up Office Visit: Patient presents to the office today with worsening dyspnea on exertion. She was seen at the heart failure clinic one day prior to OV   and they requested that she follow up with pulmonary. Spirometry 11/2014 showed  normal FEV1 with ? air trapping given her   FVC ? 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 She is continuing to smoke approx. 2 cigarettes daily per her history.    she has not been using her Breo inhaler daily as  Maintenance medication, but just when needed.   rec We will give you a Breo sample and new prescription for your Breo.( Maintenance inhaler) Use the Breo 1 puff twice daily. We will give you a prescription for your Pro Air Inhaler ( Rescue Inhaler) Use this every 6 hours as needed for SOB or Wheezing. Claritin ( loratadine) 1 every day for allergies Nasal Saline as needed for nasal congestion Continue weighing yourself daily per the heart failure clinic Follow up in 1 month > did  Not return        11/20/2018  Acute  ov/Stacey Lin re: worse since stopped symbicort  Chief Complaint  Patient presents with   Acute Visit    Increased SOB for the past 2 months. She gets winded walking from room to room at  home. She also c/o occ cough and wheezing. Her cough is mainly non prod.  She is using her albuterol inhaler 2 x per wk on average.   Dyspnea:  Gradually worse x 2 months to point to doe  Room to room = MMRC3  Cough: acutely worse x 2 days / harsh esp daytime > noct  Sleeping: on back with 2 pillows SABA use: confused with which ones help and when to take  rec Plan A = Automatic = symbicort 80 Take 2 puffs first thing in am and then another 2 puffs about 12 hours later.  Work on inhaler technique  The key is to stop smoking completely before smoking completely stops you!  Plan B = Backup Only use your albuterol (ventolin) inhaler  Please schedule a follow up office visit in 2  weeks, call sooner if needed with all medications /inhalers/ solutions in hand so we can verify exactly what you are taking. This includes all medications from all doctors and over the counters - PLEASE separate them into two bags:  the ones you take automatically, no matter what, vs the ones you take just when you feel you need them "BAG #2 is UP TO YOU"  - this will really help [place] help you take your medications more effectively.  - add: Prednisone 10 mg take  4 each am x  2 days,   2 each am x 2 days,  1 each am x 2 days and stop and spirometry on return     12/04/2018  f/u ov/Stacey Lin re:  AB / non adherence, still smoking  Chief Complaint  Patient presents with   Follow-up    Breathing is unchanged. She never started the pred taper b/c she did not understand the instructions. She has not had to use her albuterol inhaler. She has had occ cough and wheezing at night.   Dyspnea:  MMRC3 = can't walk 100 yards even at a slow pace at a flat grade s stopping due to sob   Cough: daytime / not productive  Sleeping: ok after ambien flat bed 2 pillows  SABA use: not using as long as on symbbicort  02: none  rec  Prednisone 10 mg take  4 each am x 2 days,   2 each am x 2 days,  1 each am x 2 days and stop  Work on inhaler  technique:   Plan A = Automatic = Symbicort 80 Take 2 puffs first thing in am and then another 2 puffs about 12 hours later.  Plan B = Backup Only use your albuterol inhaler as a rescue medication Please schedule a follow up office visit in 2 weeks, sooner if needed  with all medications /inhalers/ solutions in hand so we can verify exactly what you are taking. This includes all medications from all doctors and over the counters     12/19/2018  f/u ov/Stacey Lin re: cough x 3 months / maint on symbicort 80 2bid / still smoking Chief Complaint  Patient presents with   Follow-up    Breathing is doing some better and she is coughing less. She has not had to use her albuterol inhaler.   Dyspnea:  Can't do [employer] due to sob and  R knee pain  Cough: more day than noct and dry Sleeping: bed is flat/ 2 pillows  SABA : not using at present  02: no Using lots of mints  rec Stop smoking if at all possible Brush your tongue with [employer] toothpaste and garlge with the slurry Prednisone 10 mg take  4 each am x 2 days,   2 each am x 2 days,  1 each am x 2 days and stop to see if helps  As needed > tessalon 200 mg every 6 hours as needed  GERD diet Please remember to go to the lab department   for your tests - we will call you with the results when they are available.    Please schedule a follow up office visit in 6 weeks, call sooner if needed with PFTs on return       08/16/2019  f/u ov/Stacey Lin re: still smoking /never better fro last eval  Chief Complaint  Patient presents with   Follow-up    C10: asthmatic bronchitis pt unable to perform PFT due to cough and congestion today.Pt has been coughing clear mucus for the past 3 days. She had covid testin10/20 and was not feeling well.   Nasal Congestion    pt states cough is making her feel nauseous. Pt has not have fever, chills or bodyaches. Pt says today she has sore throat.  Dyspnea:  Room to room  Cough: clear mucus  Sleeping: 45 degrees  on pillows x years  SABA use: helps 02: rec Work on inhaler technique:  relax and gently blow all the way out then  take a nice smooth deep breath back in, triggering the inhaler at same time you start breathing in.  Hold for up to 5 seconds if you can. Blow symbicort out thru nose. Rinse and gargle with water when done Only use your albuterol as a rescue medication   Stop all mints  zpak  Prednisone 10 mg take  4 each am x 2 days,   2 each am x 2 days,  1 each am x 2 days and stop  Increase gabapentin to 100 mg 4 x daily -bfast lunch supper and bedtime  The key is to stop smoking completely before smoking completely stops you!  Please schedule a follow up office visit in 2 weeks, sooner if needed  with all medications /inhalers/ solutions in hand so we can verify exactly what you are taking. This includes all medications from all doctors and over the counters - needs cxr on return > did not do  - add prilosec 20 mg bid ac > did not do   09/02/2019  f/u ov/Stacey Lin re: uacs onset 08/2018 on entresto  - did not bring meds as req  Chief Complaint  Patient presents with   Follow-up    Cough is slightly better. She never started prilosec.   Dyspnea:  Room to room Cough: daytime and dry Sleeping: on side 2 pillows  SABA use: not helping  02: none  rec Pantoprazole (protonix) 40 mg   Take  30-60 min before first meal of the day and Pepcid (famotidine)  20 mg one @  bedtime until return to office - this is the best way to tell whether stomach acid is contributing to your problem.   Change gabapentin to 300 mg three times daily  You may have to stop the [facility] and start a substitute but I will defer that to Dr Ricarda Frame works for cough but it is a last resort   televist  note 12/23/19  Stop all smoking  Symbicort 80 Take 2 puffs first thing in am and then another 2 puffs about 12 hours later.  Only use your albuterol (BLUE ventolin)  as a rescue medication  Always take your medications  with you to see your doctors (including me) Based on the nature of your cough I recommend a trial off entresto for up to 6 week > did not do  If your heartcare providers do not agree with a substitute for your entresto I will ask them to send you to another pulmonary specialist as I can't help solve this problem without doing this step first.     03/05/2021  f/u ov/Stacey Lin re: resumed smoking/ did not bring meds  ? Better cough  off entresto  Chief Complaint  Patient presents with   Acute Visit    Pt c/o increased SOB past few months- she gets winded walking room to room at home. She is coughing and wheezing. Cough is prod with white sputum.   Dyspnea:  Room to room gives out  Cough: sporadic but better than used to be on entresto, worse off gerd rx Sleeping: on side / bed is flat  SABA use: not using symbicort regularly   02: none Covid status:   vax x 2  Rec The key is to stop smoking completely before smoking completely stops you! Depomedrol 80 mg IM  Stop symbicort  and start Breztri one twice daily  Work on inhaler technique:   Only use your albuterol as a rescue medication  Pantoprazole (protonix) 40  mg   Take  30-60 min before first meal of the day and Pepcid (famotidine)  20 mg after supper until return to office  GERD diet reviewed, bed blocks rec   Please remember to go to the  x-ray department  for your tests - we will call you with the results when they are available  Call your eye doctor right away or go the ER for eye pain   Please schedule a follow up office visit in 4 weeks, sooner if needed  with all medications /inhalers/ solutions in hand  02/22/2023  f/u ov/Stacey Lin re: ***   maint on ***  No chief complaint on file.   Dyspnea:  *** Cough: *** Sleeping: *** SABA use: *** 02: *** Covid status:   *** Lung cancer screening :  ***    No obvious day to day or daytime variability or assoc excess/ purulent sputum or mucus plugs or hemoptysis or cp or chest tightness,  subjective wheeze or overt sinus or hb symptoms.   *** without nocturnal  or early am exacerbation  of respiratory  c/o's or need for noct saba. Also denies any obvious fluctuation of symptoms with weather or environmental changes or other aggravating or alleviating factors except as outlined above   No unusual exposure hx or h/o childhood pna/ asthma or knowledge of premature birth.  Current Allergies, Complete Past Medical History, Past Surgical History, Family History, and Social History were reviewed in [employer] record.  ROS  The following are not active complaints unless bolded Hoarseness, sore throat, dysphagia, dental problems, itching, sneezing,  nasal congestion or discharge of excess mucus or purulent secretions, ear ache,   fever, chills, sweats, unintended wt loss or wt gain, classically pleuritic or exertional cp,  orthopnea pnd or arm/hand swelling  or leg swelling, presyncope, palpitations, abdominal pain, anorexia, nausea, vomiting, diarrhea  or change in bowel habits or change in bladder habits, change in stools or change in urine, dysuria, hematuria,  rash, arthralgias, visual complaints, headache, numbness, weakness or ataxia or problems with walking or coordination,  change in mood or  memory.        No outpatient medications have been marked as taking for the 02/22/23 encounter (Appointment) with Nyoka Cowden, MD.               Objective:   Physical Exam   wts  02/22/2023           *** 03/05/2021        173 09/02/2019        200  08/16/2019      192 12/19/2018        202  12/04/2018        205   11/20/18 201 lb 6.4 oz (91.4 kg)  10/30/18 197 lb (89.4 kg)  09/17/18 198 lb (89.8 kg)     Vital signs reviewed  02/22/2023  - Note at rest 02 sats  ***% on ***   General appearance:    ***      Edentulous***              Assessment & Plan:

## 2023-02-22 ENCOUNTER — Ambulatory Visit: Payer: 59 | Admitting: Internal Medicine

## 2023-02-22 ENCOUNTER — Encounter: Payer: Self-pay | Admitting: Internal Medicine

## 2023-03-02 ENCOUNTER — Telehealth (INDEPENDENT_AMBULATORY_CARE_PROVIDER_SITE_OTHER): Payer: Self-pay | Admitting: Primary Care

## 2023-03-02 NOTE — Telephone Encounter (Signed)
Called patient to schedule Medicare Annual Wellness Visit (AWV). Left message for patient to call back and schedule Medicare Annual Wellness Visit (AWV).  Last date of AWV: due 01/22/2017 awvi per [place]  Please schedule an appointment at any time with Abby, NHA. .  If any questions, please contact me at [phone].  Thank you,  Judeth Cornfield,  AMB Clinical Support [facility] AWV Program Direct Dial ??[phone]

## 2023-03-26 ENCOUNTER — Other Ambulatory Visit (INDEPENDENT_AMBULATORY_CARE_PROVIDER_SITE_OTHER): Payer: Self-pay | Admitting: Internal Medicine

## 2023-03-30 ENCOUNTER — Other Ambulatory Visit (INDEPENDENT_AMBULATORY_CARE_PROVIDER_SITE_OTHER): Payer: Self-pay | Admitting: Primary Care

## 2023-03-30 DIAGNOSIS — Z760 Encounter for issue of repeat prescription: Secondary | ICD-10-CM

## 2023-04-03 ENCOUNTER — Other Ambulatory Visit: Payer: Self-pay

## 2023-04-25 ENCOUNTER — Ambulatory Visit (INDEPENDENT_AMBULATORY_CARE_PROVIDER_SITE_OTHER): Payer: 59 | Admitting: Podiatry

## 2023-04-25 DIAGNOSIS — Z91199 Patient's noncompliance with other medical treatment and regimen due to unspecified reason: Secondary | ICD-10-CM

## 2023-04-25 NOTE — Progress Notes (Signed)
1. No-show for appointment     

## 2023-04-29 ENCOUNTER — Emergency Department (HOSPITAL_COMMUNITY): Payer: 59

## 2023-04-29 ENCOUNTER — Emergency Department (HOSPITAL_COMMUNITY)
Admission: EM | Admit: 2023-04-29 | Discharge: 2023-04-29 | Disposition: A | Discharge status: Home / Self Care | Payer: 59 | Attending: Emergency Medicine | Admitting: Emergency Medicine

## 2023-04-29 ENCOUNTER — Other Ambulatory Visit: Payer: Self-pay

## 2023-04-29 DIAGNOSIS — Z7951 Long term (current) use of inhaled steroids: Secondary | ICD-10-CM | POA: Diagnosis not present

## 2023-04-29 DIAGNOSIS — J810 Acute pulmonary edema: Secondary | ICD-10-CM | POA: Insufficient documentation

## 2023-04-29 DIAGNOSIS — X58XXXA Exposure to other specified factors, initial encounter: Secondary | ICD-10-CM | POA: Diagnosis not present

## 2023-04-29 DIAGNOSIS — T50901A Poisoning by unspecified drugs, medicaments and biological substances, accidental (unintentional), initial encounter: Secondary | ICD-10-CM | POA: Insufficient documentation

## 2023-04-29 DIAGNOSIS — R519 Headache, unspecified: Secondary | ICD-10-CM | POA: Diagnosis not present

## 2023-04-29 DIAGNOSIS — J449 Chronic obstructive pulmonary disease, unspecified: Secondary | ICD-10-CM | POA: Insufficient documentation

## 2023-04-29 DIAGNOSIS — R0902 Hypoxemia: Secondary | ICD-10-CM | POA: Insufficient documentation

## 2023-04-29 DIAGNOSIS — E119 Type 2 diabetes mellitus without complications: Secondary | ICD-10-CM | POA: Diagnosis not present

## 2023-04-29 DIAGNOSIS — Z7984 Long term (current) use of oral hypoglycemic drugs: Secondary | ICD-10-CM | POA: Diagnosis not present

## 2023-04-29 DIAGNOSIS — I509 Heart failure, unspecified: Secondary | ICD-10-CM | POA: Diagnosis not present

## 2023-04-29 LAB — CBC WITH DIFFERENTIAL/PLATELET
Abs Immature Granulocytes: 0.08 10*3/uL — ABNORMAL HIGH (ref 0.00–0.07)
Basophils Absolute: 0.1 10*3/uL (ref 0.0–0.1)
Basophils Relative: 0 %
Eosinophils Absolute: 0.2 10*3/uL (ref 0.0–0.5)
Eosinophils Relative: 1 %
HCT: 47 % — ABNORMAL HIGH (ref 36.0–46.0)
Hemoglobin: 15.1 g/dL — ABNORMAL HIGH (ref 12.0–15.0)
Immature Granulocytes: 1 %
Lymphocytes Relative: 12 %
Lymphs Abs: 1.8 10*3/uL (ref 0.7–4.0)
MCH: 31.7 pg (ref 26.0–34.0)
MCHC: 32.1 g/dL (ref 30.0–36.0)
MCV: 98.7 fL (ref 80.0–100.0)
Monocytes Absolute: 0.6 10*3/uL (ref 0.1–1.0)
Monocytes Relative: 4 %
Neutro Abs: 11.9 10*3/uL — ABNORMAL HIGH (ref 1.7–7.7)
Neutrophils Relative %: 82 %
Platelets: 259 10*3/uL (ref 150–400)
RBC: 4.76 MIL/uL (ref 3.87–5.11)
RDW: 12.7 % (ref 11.5–15.5)
WBC: 14.6 10*3/uL — ABNORMAL HIGH (ref 4.0–10.5)
nRBC: 0 % (ref 0.0–0.2)

## 2023-04-29 LAB — ACETAMINOPHEN LEVEL: Acetaminophen (Tylenol), Serum: <10 ug/mL — ABNORMAL LOW (ref 10–30)

## 2023-04-29 LAB — RAPID URINE DRUG SCREEN, HOSP PERFORMED
Amphetamines: NOT DETECTED
Barbiturates: NOT DETECTED
Benzodiazepines: NOT DETECTED
Cocaine: POSITIVE — AB
Opiates: NOT DETECTED
Tetrahydrocannabinol: NOT DETECTED

## 2023-04-29 LAB — I-STAT VENOUS BLOOD GAS, ED
Acid-Base Excess: 0 mmol/L (ref 0.0–2.0)
Bicarbonate: 26.2 mmol/L (ref 20.0–28.0)
Calcium, Ion: 1.07 mmol/L — ABNORMAL LOW (ref 1.15–1.40)
HCT: 45 % (ref 36.0–46.0)
Hemoglobin: 15.3 g/dL — ABNORMAL HIGH (ref 12.0–15.0)
O2 Saturation: 99 %
Potassium: 3.3 mmol/L — ABNORMAL LOW (ref 3.5–5.1)
Sodium: 137 mmol/L (ref 135–145)
TCO2: 28 mmol/L (ref 22–32)
pCO2, Ven: 47.8 mmHg (ref 44–60)
pH, Ven: 7.347 (ref 7.25–7.43)
pO2, Ven: 134 mmHg — ABNORMAL HIGH (ref 32–45)

## 2023-04-29 LAB — COMPREHENSIVE METABOLIC PANEL
ALT: 15 U/L (ref 0–44)
AST: 23 U/L (ref 15–41)
Albumin: 3 g/dL — ABNORMAL LOW (ref 3.5–5.0)
Alkaline Phosphatase: 88 U/L (ref 38–126)
Anion gap: 15 (ref 5–15)
BUN: 22 mg/dL — ABNORMAL HIGH (ref 6–20)
CO2: 22 mmol/L (ref 22–32)
Calcium: 8.8 mg/dL — ABNORMAL LOW (ref 8.9–10.3)
Chloride: 100 mmol/L (ref 98–111)
Creatinine, Ser: 1.06 mg/dL — ABNORMAL HIGH (ref 0.44–1.00)
GFR, Estimated: >60 mL/min (ref >60)
Glucose, Bld: 221 mg/dL — ABNORMAL HIGH (ref 70–99)
Potassium: 3.6 mmol/L (ref 3.5–5.1)
Sodium: 137 mmol/L (ref 135–145)
Total Bilirubin: 0.6 mg/dL (ref 0.3–1.2)
Total Protein: 5.5 g/dL — ABNORMAL LOW (ref 6.5–8.1)

## 2023-04-29 LAB — CBG MONITORING, ED: Glucose-Capillary: 204 mg/dL — ABNORMAL HIGH (ref 70–99)

## 2023-04-29 LAB — ETHANOL: Alcohol, Ethyl (B): <10 mg/dL (ref <10)

## 2023-04-29 LAB — SALICYLATE LEVEL: Salicylate Lvl: <7 mg/dL — ABNORMAL LOW (ref 7.0–30.0)

## 2023-04-29 MED ORDER — NALOXONE HCL 4 MG/0.1ML NA LIQD: NASAL | 1 refills | Status: AC

## 2023-04-29 MED ORDER — FUROSEMIDE 10 MG/ML IJ SOLN
40.0000 mg | INTRAMUSCULAR | Status: AC
  Administered 2023-04-29: 40 mg via INTRAVENOUS
  Filled 2023-04-29: qty 4

## 2023-04-29 NOTE — ED Triage Notes (Signed)
Pt BIB [facility] from home due to drug overdose.  Last seen normal around 0430 today 04/29/23.    Pt was ventilated for about 15-20 mins by EMS on scene.  Pt given 8mg  IN Narcan by [employer].  EMS gave additional 2mg  via IV.  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  VS BP 150/90, HR 100, CBG 303.  2L nasal cannula.  22g right AC.

## 2023-04-29 NOTE — Discharge Instructions (Signed)
You were seen after overdosing in the emergency department.  If you or anyone around you overdoses you the Narcan we have prescribed you.  Follow-up with the behavioral health urgent care for resources on substance use.

## 2023-04-29 NOTE — ED Provider Notes (Signed)
[place] EMERGENCY DEPARTMENT AT [facility] Provider Note   CSN: [id number] Arrival date & time: 04/29/23  4098     History {Add pertinent medical, surgical, social history, OB history to HPI:1} Chief Complaint  Patient presents with  . Drug Overdose    Stacey Lin is a 57 y.o. female.  57 year old female with history of COPD not on home oxygen, CHF (EF 30 to 35%), diabetes, and cocaine abuse who presents to the emergency department with suspected overdose.  Very limited history available at this time.  Patient was seen this morning by family and then was found after suspected overdose.  Firefighters gave 8 mg of intranasal Narcan and EMS gave an additional 2 mg IV.  Patient appears to have woken up since then.  Says that she has a mild headache but no other complaints at this time.  Patient unable to provide additional history and is very drowsy currently.  Additional history obtained per patient's caretaker.  Reports that at 5 AM this morning she was normal and was walking outside.  She came back into the patient's room and she was found unconscious next to another family member who is also unconscious.  They suspected drug overdose and so gave her Narcan.  Family member did some compressions on the patient.  Reports that the other family member was drowsy but was arousable.       Home Medications Prior to Admission medications   Medication Sig Start Date End Date Taking? Authorizing Provider  ACCU-CHEK GUIDE test strip TEST THREE TIMES DAILY 07/21/21   Ivonne Andrew, NP  Accu-Chek Softclix Lancets lancets TEST BLOOD SUGAR THREE TIMES DAILY 07/21/21   Ivonne Andrew, NP  acetaminophen (TYLENOL) 500 MG tablet Take 500 mg by mouth every 6 (six) hours as needed for moderate pain or headache.    [provider]  allopurinol (ZYLOPRIM) 100 MG tablet TAKE ONE TABLET BY MOUTH TWICE DAILY 01/02/23   Grayce Sessions, NP  azelastine (ASTELIN) 0.1 % nasal spray  Place 2 sprays into both nostrils 2 (two) times daily. Use in each nostril as directed 01/17/22   Cobb, Ruby Cola, NP  Blood Glucose Monitoring Suppl (ACCU-CHEK AVIVA PLUS) w/Device KIT 1 each by Does not apply route 3 (three) times daily. 05/08/20   Hoy Register, MD  Boric Acid Vaginal 600 MG SUPP Place 1 suppository vaginally 2 (two) times daily. 08/22/22   Grayce Sessions, NP  Budeson-Glycopyrrol-Formoterol (BREZTRI AEROSPHERE) 160-9-4.8 MCG/ACT AERO Inhale 2 puffs into the lungs in the morning and at bedtime. 01/17/22   Cobb, Ruby Cola, NP  carvedilol (COREG) 3.125 MG tablet TAKE ONE TABLET BY MOUTH TWICE DAILY WITH A MEAL 08/09/22   Bensimhon, Bevelyn Buckles, MD  carvedilol (COREG) 3.125 MG tablet Take 1 tablet (3.125 mg total) by mouth 2 (two) times daily with a meal. NEEDS FOLLOW UP APPOINTMENT FOR ANY MORE REFILLS 03/27/23   Bensimhon, Bevelyn Buckles, MD  dapagliflozin propanediol (FARXIGA) 10 MG TABS tablet Take 1 tablet (10 mg total) by mouth daily. 08/25/22   Bensimhon, Bevelyn Buckles, MD  escitalopram (LEXAPRO) 20 MG tablet TAKE ONE TABLET BY MOUTH ONCE DAILY 01/02/23   Grayce Sessions, NP  famotidine (PEPCID) 20 MG tablet One after supper 03/05/21   Nyoka Cowden, MD  furosemide (LASIX) 40 MG tablet TAKE ONE TABLET BY MOUTH TWICE DAILY 08/09/22   Bensimhon, Bevelyn Buckles, MD  furosemide (LASIX) 40 MG tablet Take 1 tablet (40 mg total) by mouth 2 (  two) times daily. NEEDS FOLLOW UP APPOINTMENT FOR ANYMORE REFILLS 03/27/23   Bensimhon, Bevelyn Buckles, MD  gabapentin (NEURONTIN) 300 MG capsule TAKE ONE CAPSULE BY MOUTH TWICE DAILY 02/01/23   Grayce Sessions, NP  hydrOXYzine (ATARAX) 10 MG tablet TAKE ONE TABLET BY MOUTH THREE TIMES DAILY AS NEEDED 01/02/23   Grayce Sessions, NP  lidocaine (LIDODERM) 5 % Place 1 patch onto the skin daily. Remove & Discard patch within 12 hours or as directed by MD 08/25/22   Tilden Fossa, MD  losartan (COZAAR) 25 MG tablet TAKE ONE TABLET BY MOUTH ONCE DAILY 01/02/23    Grayce Sessions, NP  metFORMIN (GLUCOPHAGE-XR) 500 MG 24 hr tablet TAKE ONE TABLET BY MOUTH ONCE DAILY 01/02/23   Grayce Sessions, NP  montelukast (SINGULAIR) 10 MG tablet TAKE ONE TABLET BY MOUTH EVERYDAY AT BEDTIME 02/01/23   Grayce Sessions, NP  spironolactone (ALDACTONE) 25 MG tablet TAKE ONE TABLET BY MOUTH ONCE DAILY 08/25/22   Bensimhon, Bevelyn Buckles, MD  SYMBICORT 80-4.5 MCG/ACT inhaler Inhale 2 puffs into the lungs daily as needed (sob/wheezing). 08/04/22   Grayce Sessions, NP  tiZANidine (ZANAFLEX) 2 MG tablet Take 1 tablet (2 mg total) by mouth every 8 (eight) hours as needed for muscle spasms. 08/25/22   Tilden Fossa, MD      Allergies    Ketoconazole    Review of Systems   Review of Systems  Physical Exam Updated Vital Signs BP 138/79 (BP Location: Left Arm)   Pulse 84   Temp 98.5 F (36.9 C) (Oral)   Resp 16   Ht 5\' 1"  (1.549 m)   Wt 113.4 kg   LMP  (LMP Unknown)   SpO2 94%   BMI 47.24 kg/m  Physical Exam Vitals and nursing note reviewed.  Constitutional:      General: She is in acute distress.     Appearance: She is well-developed.     Comments: Drowsy but will respond yes and no to questions and say her name.  Quickly falls back asleep.  HENT:     Head: Normocephalic and atraumatic.     Right Ear: External ear normal.     Left Ear: External ear normal.     Nose: Nose normal.  Eyes:     Extraocular Movements: Extraocular movements intact.     Conjunctiva/sclera: Conjunctivae normal.     Pupils: Pupils are equal, round, and reactive to light.     Comments: Pupils 4 mm bilaterally  Cardiovascular:     Rate and Rhythm: Normal rate and regular rhythm.  Pulmonary:     Effort: Pulmonary effort is normal. No respiratory distress.     Breath sounds: Normal breath sounds.     Comments: On 3 L nasal cannula Abdominal:     General: Abdomen is flat. There is no distension.     Palpations: Abdomen is soft. There is no mass.     Tenderness: There is no  abdominal tenderness. There is no guarding.  Musculoskeletal:     Cervical back: Normal range of motion and neck supple.     Right lower leg: No edema.     Left lower leg: No edema.  Skin:    General: Skin is warm and dry.  Neurological:     Comments: No obvious facial droop noted.  Intact sensation to light touch in all 4 extremities.  Withdraws all 4 extremities to pain.  Not complying with remainder of neurologic exam.  Psychiatric:  Mood and Affect: Mood normal.     ED Results / Procedures / Treatments   Labs (all labs ordered are listed, but only abnormal results are displayed) Labs Reviewed  CBG MONITORING, ED - Abnormal; Notable for the following components:      Result Value   Glucose-Capillary 204 (*)    All other components within normal limits  COMPREHENSIVE METABOLIC PANEL  SALICYLATE LEVEL  ACETAMINOPHEN LEVEL  ETHANOL  RAPID URINE DRUG SCREEN, HOSP PERFORMED  CBC WITH DIFFERENTIAL/PLATELET  I-STAT VENOUS BLOOD GAS, ED    EKG EKG Interpretation Date/Time:  Saturday April 29 2023 07:39:29 EDT Ventricular Rate:  86 PR Interval:  177 QRS Duration:  88 QT Interval:  446 QTC Calculation: 534 R Axis:   43  Text Interpretation: Interpretation limited secondary to artifact Sinus rhythm Probable left atrial enlargement Low voltage, extremity leads Minimal ST depression, lateral leads Prolonged QT interval Confirmed by Vonita Moss [phone]) on 04/29/2023 7:45:40 AM  Radiology No results found.  Procedures Procedures  {Document cardiac monitor, telemetry assessment procedure when appropriate:1}  Medications Ordered in ED Medications - No data to display  ED Course/ Medical Decision Making/ A&P Clinical Course as of 04/29/23 0844  Sat Apr 29, 2023  6045 Family reports that the additional family member who appeared to have overdosed did admit to the 2 of them smoking dope [RP]    Clinical Course User Index [RP] Rondel Baton, MD   {   Click  here for ABCD2, HEART and other calculatorsREFRESH Note before signing :1}                          Medical Decision Making Amount and/or Complexity of Data Reviewed Labs: ordered. Radiology: ordered.   ***  {Document critical care time when appropriate:1} {Document review of labs and clinical decision tools ie heart score, Chads2Vasc2 etc:1}  {Document your independent review of radiology images, and any outside records:1} {Document your discussion with family members, caretakers, and with consultants:1} {Document social determinants of health affecting pt's care:1} {Document your decision making why or why not admission, treatments were needed:1} Final Clinical Impression(s) / ED Diagnoses Final diagnoses:  None    Rx / DC Orders ED Discharge Orders     None

## 2023-05-01 ENCOUNTER — Other Ambulatory Visit (INDEPENDENT_AMBULATORY_CARE_PROVIDER_SITE_OTHER): Payer: Self-pay | Admitting: Primary Care

## 2023-05-01 ENCOUNTER — Other Ambulatory Visit (INDEPENDENT_AMBULATORY_CARE_PROVIDER_SITE_OTHER): Payer: Self-pay | Admitting: Internal Medicine

## 2023-05-01 DIAGNOSIS — J4541 Moderate persistent asthma with (acute) exacerbation: Secondary | ICD-10-CM

## 2023-05-02 ENCOUNTER — Encounter (INDEPENDENT_AMBULATORY_CARE_PROVIDER_SITE_OTHER): Payer: 59 | Admitting: Primary Care

## 2023-05-02 NOTE — Telephone Encounter (Signed)
Requested Prescriptions  Pending Prescriptions Disp Refills   gabapentin (NEURONTIN) 300 MG capsule [Pharmacy Med Name: gabapentin 300 mg capsule] 180 capsule 0    Sig: TAKE ONE CAPSULE BY MOUTH TWICE DAILY     Neurology: Anticonvulsants - gabapentin Failed - 05/01/2023  4:17 PM      Failed - Cr in normal range and within 360 days    Creat  Date Value Ref Range Status  09/16/2015 1.49 (H) 0.50 - 1.05 mg/dL Final   Creatinine, Ser  Date Value Ref Range Status  04/29/2023 1.06 (H) 0.44 - 1.00 mg/dL Final   Creatinine,U  Date Value Ref Range Status  08/05/2009 115.4 mg/dL Final    Comment:    See lab report for associated comment(s)         Passed - Completed PHQ-2 or PHQ-9 in the last 360 days      Passed - Valid encounter within last 12 months    Recent Outpatient Visits           9 months ago Vaginal itching   [place] [facility] Grayce Sessions, NP   11 months ago Pruritus   [place] [facility] Grayce Sessions, NP   1 year ago Asthmatic bronchitis with exacerbation, moderate persistent   [place] [facility] Grayce Sessions, NP   1 year ago Type 2 diabetes mellitus without complication, without long-term current use of insulin (HCC)   [place] [facility] Grayce Sessions, NP   2 years ago Type 2 diabetes mellitus without complication, without long-term current use of insulin (HCC)   [place] [facility] Grayce Sessions, NP       Future Appointments             Today Grayce Sessions, NP [place] [facility]             montelukast (SINGULAIR) 10 MG tablet [Pharmacy Med Name: montelukast 10 mg tablet] 90 tablet 0    Sig: TAKE ONE TABLET BY MOUTH EVERYDAY AT BEDTIME     Pulmonology:  Leukotriene Inhibitors Passed - 05/01/2023  4:17 PM      Passed - Valid encounter within last 12 months    Recent Outpatient Visits           9  months ago Vaginal itching   [place] [facility] Grayce Sessions, NP   11 months ago Pruritus   [place] [facility] Grayce Sessions, NP   1 year ago Asthmatic bronchitis with exacerbation, moderate persistent   [place] [facility] Grayce Sessions, NP   1 year ago Type 2 diabetes mellitus without complication, without long-term current use of insulin (HCC)   [place] [facility] Grayce Sessions, NP   2 years ago Type 2 diabetes mellitus without complication, without long-term current use of insulin (HCC)   [place] [facility] Grayce Sessions, NP       Future Appointments             Today Grayce Sessions, NP [place] [facility]

## 2023-05-08 ENCOUNTER — Encounter (INDEPENDENT_AMBULATORY_CARE_PROVIDER_SITE_OTHER): Payer: 59 | Admitting: Primary Care

## 2023-05-08 ENCOUNTER — Encounter (INDEPENDENT_AMBULATORY_CARE_PROVIDER_SITE_OTHER): Payer: Self-pay

## 2023-06-05 ENCOUNTER — Other Ambulatory Visit (INDEPENDENT_AMBULATORY_CARE_PROVIDER_SITE_OTHER): Payer: Self-pay | Admitting: Internal Medicine

## 2023-06-20 ENCOUNTER — Other Ambulatory Visit (INDEPENDENT_AMBULATORY_CARE_PROVIDER_SITE_OTHER): Payer: Self-pay | Admitting: Primary Care

## 2023-06-21 NOTE — Telephone Encounter (Signed)
Needs appt

## 2023-06-21 NOTE — Telephone Encounter (Signed)
Will forward to provider  

## 2023-06-25 ENCOUNTER — Other Ambulatory Visit (INDEPENDENT_AMBULATORY_CARE_PROVIDER_SITE_OTHER): Payer: Self-pay | Admitting: Primary Care

## 2023-06-25 ENCOUNTER — Other Ambulatory Visit (INDEPENDENT_AMBULATORY_CARE_PROVIDER_SITE_OTHER): Payer: Self-pay | Admitting: Internal Medicine

## 2023-06-26 ENCOUNTER — Other Ambulatory Visit (INDEPENDENT_AMBULATORY_CARE_PROVIDER_SITE_OTHER): Payer: Self-pay | Admitting: Internal Medicine

## 2023-06-26 ENCOUNTER — Other Ambulatory Visit (INDEPENDENT_AMBULATORY_CARE_PROVIDER_SITE_OTHER): Payer: Self-pay | Admitting: Primary Care

## 2023-06-26 DIAGNOSIS — J4541 Moderate persistent asthma with (acute) exacerbation: Secondary | ICD-10-CM

## 2023-06-27 NOTE — Telephone Encounter (Signed)
Requested Prescriptions  Refused Prescriptions Disp Refills   SYMBICORT 80-4.5 MCG/ACT inhaler [Pharmacy Med Name: Symbicort 80 mcg-4.5 mcg/actuation HFA aerosol inhaler] 10.2 g 6    Sig: INHALE TWO PUFFS BY MOUTH INTO LUNGS daily AS NEEDED FOR WHEEZING AND/OR SHORTNESS OF BREATH     Pulmonology:  Combination Products Passed - 06/25/2023  8:01 AM      Passed - Valid encounter within last 12 months    Recent Outpatient Visits           10 months ago Vaginal itching   [place] [facility] Grayce Sessions, NP   1 year ago Pruritus   [place] [facility] Grayce Sessions, NP   1 year ago Asthmatic bronchitis with exacerbation, moderate persistent   [place] [facility] Grayce Sessions, NP   1 year ago Type 2 diabetes mellitus without complication, without long-term current use of insulin (HCC)   [place] [facility] Grayce Sessions, NP   2 years ago Type 2 diabetes mellitus without complication, without long-term current use of insulin (HCC)   [place] [facility] Grayce Sessions, NP

## 2023-06-27 NOTE — Telephone Encounter (Signed)
Will forward to provider  

## 2023-06-29 ENCOUNTER — Other Ambulatory Visit (INDEPENDENT_AMBULATORY_CARE_PROVIDER_SITE_OTHER): Payer: Self-pay | Admitting: Primary Care

## 2023-06-29 DIAGNOSIS — J4541 Moderate persistent asthma with (acute) exacerbation: Secondary | ICD-10-CM

## 2023-06-30 ENCOUNTER — Other Ambulatory Visit (HOSPITAL_COMMUNITY): Payer: Self-pay | Admitting: Internal Medicine

## 2023-06-30 ENCOUNTER — Other Ambulatory Visit (INDEPENDENT_AMBULATORY_CARE_PROVIDER_SITE_OTHER): Payer: Self-pay | Admitting: Primary Care

## 2023-06-30 DIAGNOSIS — J4541 Moderate persistent asthma with (acute) exacerbation: Secondary | ICD-10-CM

## 2023-07-20 ENCOUNTER — Other Ambulatory Visit (INDEPENDENT_AMBULATORY_CARE_PROVIDER_SITE_OTHER): Payer: Self-pay | Admitting: Primary Care

## 2023-07-20 NOTE — Telephone Encounter (Signed)
Medication Refill - Medication: LOSARTAN 25MG   Pt now has apt for Oct 9 at 11:10  Has the patient contacted their pharmacy? yes  (Agent: If yes, when and what did the pharmacy advise?)contact pcp  Preferred Pharmacy (with phone number or street name):  [facility] [address] [place] - [address] Phone: [phone]  Fax: [phone]     Has the patient been seen for an appointment in the last year OR does the patient have an upcoming appointment? yes  Agent: Please be advised that RX refills may take up to 3 business days. We ask that you follow-up with your pharmacy.

## 2023-07-21 ENCOUNTER — Encounter (HOSPITAL_COMMUNITY): Payer: Self-pay

## 2023-07-21 ENCOUNTER — Emergency Department (HOSPITAL_COMMUNITY)
Admission: EM | Admit: 2023-07-21 | Discharge: 2023-07-22 | Disposition: A | Discharge status: Home / Self Care | Payer: 59 | Attending: Emergency Medicine | Admitting: Emergency Medicine

## 2023-07-21 ENCOUNTER — Other Ambulatory Visit: Payer: Self-pay

## 2023-07-21 ENCOUNTER — Other Ambulatory Visit (HOSPITAL_COMMUNITY): Payer: Self-pay | Admitting: Internal Medicine

## 2023-07-21 ENCOUNTER — Emergency Department (HOSPITAL_COMMUNITY): Payer: 59

## 2023-07-21 DIAGNOSIS — I110 Hypertensive heart disease with heart failure: Secondary | ICD-10-CM | POA: Insufficient documentation

## 2023-07-21 DIAGNOSIS — J45909 Unspecified asthma, uncomplicated: Secondary | ICD-10-CM | POA: Insufficient documentation

## 2023-07-21 DIAGNOSIS — Z87891 Personal history of nicotine dependence: Secondary | ICD-10-CM | POA: Diagnosis not present

## 2023-07-21 DIAGNOSIS — Z955 Presence of coronary angioplasty implant and graft: Secondary | ICD-10-CM | POA: Insufficient documentation

## 2023-07-21 DIAGNOSIS — I509 Heart failure, unspecified: Secondary | ICD-10-CM | POA: Diagnosis not present

## 2023-07-21 DIAGNOSIS — R0602 Shortness of breath: Secondary | ICD-10-CM | POA: Diagnosis present

## 2023-07-21 DIAGNOSIS — J441 Chronic obstructive pulmonary disease with (acute) exacerbation: Secondary | ICD-10-CM | POA: Diagnosis not present

## 2023-07-21 MED ORDER — IPRATROPIUM-ALBUTEROL 0.5-2.5 (3) MG/3ML IN SOLN
3.0000 mL | Freq: Four times a day (QID) | RESPIRATORY_TRACT | Status: DC | PRN
  Administered 2023-07-21: 3 mL via RESPIRATORY_TRACT

## 2023-07-21 MED ORDER — ACETAMINOPHEN 500 MG PO TABS
1000.0000 mg | ORAL_TABLET | Freq: Once | ORAL | Status: AC
  Administered 2023-07-21: 1000 mg via ORAL
  Filled 2023-07-21: qty 2

## 2023-07-21 NOTE — ED Provider Notes (Signed)
[facility] Emergency Department Provider Note MRN:  [id number]  Arrival date & time: 07/22/23     Chief Complaint   Abdominal Pain, Shortness of Breath, and Diarrhea   History of Present Illness   Stacey Lin is a 57 y.o. year-old female with a history of CHF, COPD, HTN presenting to the ED with chief complaint of shortness of breath.  Worsening shortness of breath over the past few days, much worse this evening.  Was in significant respiratory distress with EMS per report, was close to needing bag-valve-mask.  Received magnesium, Solu-Medrol, oxygen, feeling better.  Having some epigastric pain for the past few days.  No fever.  Denies chest pain, complaining of headache.  Review of Systems  A thorough review of systems was obtained and all systems are negative except as noted in the HPI and PMH.   Patient's Health History    Past Medical History:  Diagnosis Date   Anemia    Anginal pain (HCC)    2 months   Arrhythmia    Asthma    CHF (congestive heart failure) (HCC)    1990s   Chronic headaches    COPD (chronic obstructive pulmonary disease) (HCC)    ??   Hypercholesteremia    Hypertension     Past Surgical History:  Procedure Laterality Date   LEFT AND RIGHT HEART CATHETERIZATION WITH CORONARY ANGIOGRAM N/A 06/06/2014   Procedure: LEFT AND RIGHT HEART CATHETERIZATION WITH CORONARY ANGIOGRAM;  Surgeon: Dolores Patty, MD;  Location: [facility] CATH LAB;  Service: Cardiovascular;  Laterality: N/A;   MULTIPLE EXTRACTIONS WITH ALVEOLOPLASTY Bilateral 07/24/2015   Procedure: MULTIPLE EXTRACTIONS WITH ALVEOLOPLASTY,  BILATERAL TORI ;  Surgeon: Ocie Doyne, DDS;  [facility];  Service: Oral Surgery;  Laterality: Bilateral;   RIGHT/LEFT HEART CATH AND CORONARY ANGIOGRAPHY N/A 04/11/2022   Procedure: RIGHT/LEFT HEART CATH AND CORONARY ANGIOGRAPHY;  Surgeon: Corky Crafts, MD;  Location: [facility] INVASIVE CV LAB;  Service: Cardiovascular;  Laterality:  N/A;   TUBAL LIGATION  1986    Family History  Problem Relation Age of Onset   Hypertension Mother    Allergies Mother    Asthma Mother    Heart disease Mother    Stomach cancer Mother        Deceased, 1   Seizures Mother    Hypertension Father        Deceased   Hypertension Maternal Grandmother    Healthy Brother    Healthy Son    Healthy Daughter     Social History   Socioeconomic History   Marital status: Legally Separated    Spouse name: Not on file   Number of children: Not on file   Years of education: Not on file   Highest education level: Not on file  Occupational History   Not on file  Tobacco Use   Smoking status: Former    Current packs/day: 0.00    Average packs/day: 1 pack/day for 35.0 years (35.0 ttl pk-yrs)    Types: Cigarettes    Start date: 08/25/1984    Quit date: 08/26/2019    Years since quitting: 3.9   Smokeless tobacco: Never  Vaping Use   Vaping status: Never Used  Substance and Sexual Activity   Alcohol use: No    Alcohol/week: 0.0 standard drinks of alcohol   Drug use: Not Currently    Frequency: 4.0 times per week    Types: "Crack" cocaine    Comment: RELAPSE 12/2016   Sexual  activity: Not on file  Other Topics Concern   Not on file  Social History Narrative   Lives with son in an apartment on the third floor.  Does not work.  On disability.  Previously worked in [employer].    Education: high school.     Social Determinants of Health   Financial Resource Strain: Not on file  Food Insecurity: Not on file  Transportation Needs: Not on file  Physical Activity: Not on file  Stress: Not on file  Social Connections: Not on file  Intimate Partner Violence: Not on file     Physical Exam   Vitals:   07/22/23 0215 07/22/23 0230  BP: 123/75 124/77  Pulse: 96 95  Resp: 16 (!) 21  Temp:    SpO2: 94% 94%    CONSTITUTIONAL: Chronically ill-appearing, NAD NEURO/PSYCH:  Alert and oriented x 3, no focal deficits EYES:  eyes equal and  reactive ENT/NECK:  no LAD, no JVD CARDIO: Regular rate, well-perfused, normal S1 and S2 PULM: Diffuse rhonchi, mild tachypnea GI/GU:  non-distended, non-tender MSK/SPINE:  No gross deformities, no edema SKIN:  no rash, atraumatic   *Additional and/or pertinent findings included in MDM below  Diagnostic and Interventional Summary    EKG Interpretation Date/Time:  Friday July 21 2023 23:16:12 EDT Ventricular Rate:  83 PR Interval:  173 QRS Duration:  81 QT Interval:  428 QTC Calculation: 503 R Axis:   -5  Text Interpretation: Sinus rhythm Borderline low voltage, extremity leads Consider anterior infarct Confirmed by Kennis Carina [phone]) on 07/21/2023 11:27:55 PM       Labs Reviewed  COMPREHENSIVE METABOLIC PANEL - Abnormal; Notable for the following components:      Result Value   CO2 21 (*)    Glucose, Bld 118 (*)    Calcium 8.7 (*)    Total Protein 5.3 (*)    Albumin 2.9 (*)    All other components within normal limits  URINALYSIS, ROUTINE W REFLEX MICROSCOPIC - Abnormal; Notable for the following components:   Color, Urine STRAW (*)    Glucose, UA >=500 (*)    Leukocytes,Ua TRACE (*)    Bacteria, UA RARE (*)    All other components within normal limits  BRAIN NATRIURETIC PEPTIDE - Abnormal; Notable for the following components:   B Natriuretic Peptide 128.2 (*)    All other components within normal limits  CBC  LIPASE, BLOOD  PROTIME-INR  TROPONIN I (HIGH SENSITIVITY)  TROPONIN I (HIGH SENSITIVITY)    DG Chest Port 1 View  Final Result      Medications  ipratropium-albuterol (DUONEB) 0.5-2.5 (3) MG/3ML nebulizer solution 3 mL (3 mLs Nebulization Given 07/21/23 2317)  ipratropium (ATROVENT HFA) inhaler 2 puff (has no administration in time range)  acetaminophen (TYLENOL) tablet 1,000 mg (1,000 mg Oral Given 07/21/23 2340)     Procedures  /  Critical Care Procedures  ED Course and Medical Decision Making  Initial Impression and Ddx Respiratory  distress, history of CHF, COPD.  Suspect either COPD or CHF exacerbation.  Does not appear significantly fluid overloaded however the lungs sound more wet, there are also a few wheezes.  Pneumonia also considered.  Seems to be doing better than with EMS.  Has already received Solu-Medrol, magnesium, receiving breathing treatments currently.  Having some epigastric pain for the past few days, and so considering pancreatitis, atypical presentation of ACS, biliary colic.  Soft abdomen however.  Will monitor closely.  Past medical/surgical history that increases complexity of  ED encounter: COPD, CHF  Interpretation of Diagnostics I personally reviewed the EKG and my interpretation is as follows: Sinus rhythm  Labs reassuring with no significant blood count or electrolyte disturbance.  Minimally elevated BNP.  Troponin negative.  Patient Reassessment and Ultimate Disposition/Management     Patient feeling much better, resting comfortably, vitals normal.  On her baseline O2 supplementation.  Appropriate for discharge.  Patient management required discussion with the following services or consulting groups:  None  Complexity of Problems Addressed Acute illness or injury that poses threat of life of bodily function  Additional Data Reviewed and Analyzed Further history obtained from: Further history from spouse/family member  Additional Factors Impacting ED Encounter Risk Prescriptions and Consideration of hospitalization  Elmer Sow. Pilar Plate, MD [facility] Emergency [facility] [email or web address]  Final Clinical Impressions(s) / ED Diagnoses     ICD-10-CM   1. COPD exacerbation (HCC)  J44.1       ED Discharge Orders          Ordered    predniSONE (DELTASONE) 20 MG tablet  Daily        07/22/23 0236    albuterol (VENTOLIN HFA) 108 (90 Base) MCG/ACT inhaler  Every 4 hours PRN        07/22/23 0236             Discharge Instructions Discussed with and  Provided to Patient:     Discharge Instructions      You were evaluated in the Emergency Department and after careful evaluation, we did not find any emergent condition requiring admission or further testing in the hospital.  Your exam/testing today is overall reassuring.  Symptoms likely due to a flare of your COPD.  Take the prednisone daily as prescribed, use the inhaler at home, follow-up with your primary care doctor.  Please return to the Emergency Department if you experience any worsening of your condition.   Thank you for allowing [place] to be a part of your care.       Sabas Sous, MD 07/22/23 [phone]

## 2023-07-21 NOTE — Telephone Encounter (Signed)
Requested medications are due for refill today.  yes  Requested medications are on the active medications list.  yes  Last refill. 01/02/2023 #35 5 rf  Future visit scheduled.   yes  Notes to clinic.  Abnormal labs. Last ov 11 months ago    Requested Prescriptions  Pending Prescriptions Disp Refills   losartan (COZAAR) 25 MG tablet 35 tablet 5    Sig: Take 1 tablet (25 mg total) by mouth daily.     Cardiovascular:  Angiotensin Receptor Blockers Failed - 07/20/2023  5:26 PM      Failed - Cr in normal range and within 180 days    Creat  Date Value Ref Range Status  09/16/2015 1.49 (H) 0.50 - 1.05 mg/dL Final   Creatinine, Ser  Date Value Ref Range Status  04/29/2023 1.06 (H) 0.44 - 1.00 mg/dL Final   Creatinine,U  Date Value Ref Range Status  08/05/2009 115.4 mg/dL Final    Comment:    See lab report for associated comment(s)         Failed - K in normal range and within 180 days    Potassium  Date Value Ref Range Status  04/29/2023 3.3 (L) 3.5 - 5.1 mmol/L Final         Failed - Valid encounter within last 6 months    Recent Outpatient Visits           11 months ago Vaginal itching   [place] [facility] Grayce Sessions, NP   1 year ago Pruritus   [place] [facility] Grayce Sessions, NP   1 year ago Asthmatic bronchitis with exacerbation, moderate persistent   [place] [facility] Grayce Sessions, NP   1 year ago Type 2 diabetes mellitus without complication, without long-term current use of insulin (HCC)   [place] [facility] Grayce Sessions, NP   2 years ago Type 2 diabetes mellitus without complication, without long-term current use of insulin [facility])   [place] [facility] Grayce Sessions, NP       Future Appointments             In 1 week Randa Evens Kinnie Scales, NP [place] [facility]            Passed - Patient is not  pregnant      Passed - Last BP in normal range    BP Readings from Last 1 Encounters:  04/29/23 104/65

## 2023-07-21 NOTE — ED Provider Notes (Incomplete)
[facility] DEPT [facility] Emergency Department Provider Note MRN:  [id number]  Arrival date & time: 07/21/23     Chief Complaint   Abdominal Pain, Shortness of Breath, and Diarrhea   History of Present Illness   Stacey Lin is a 57 y.o. year-old female with a history of CHF, COPD, HTN presenting to the ED with chief complaint of shortness of breath.  Worsening shortness of breath over the past few days, much worse this evening.  Was in significant respiratory distress with EMS per report, was close to needing bag-valve-mask.  Received magnesium, Solu-Medrol, oxygen, feeling better.  Having some epigastric pain for the past few days.  No fever.  Denies chest pain, complaining of headache.  Review of Systems  A thorough review of systems was obtained and all systems are negative except as noted in the HPI and PMH.   Patient's Health History    Past Medical History:  Diagnosis Date  . Anemia   . Anginal pain (HCC)    2 months  . Arrhythmia   . Asthma   . CHF (congestive heart failure) (HCC)    1990s  . Chronic headaches   . COPD (chronic obstructive pulmonary disease) (HCC)    ??  . Hypercholesteremia   . Hypertension     Past Surgical History:  Procedure Laterality Date  . LEFT AND RIGHT HEART CATHETERIZATION WITH CORONARY ANGIOGRAM N/A 06/06/2014   Procedure: LEFT AND RIGHT HEART CATHETERIZATION WITH CORONARY ANGIOGRAM;  Surgeon: Dolores Patty, MD;  Location: [facility] CATH LAB;  Service: Cardiovascular;  Laterality: N/A;  . MULTIPLE EXTRACTIONS WITH ALVEOLOPLASTY Bilateral 07/24/2015   Procedure: MULTIPLE EXTRACTIONS WITH ALVEOLOPLASTY,  BILATERAL TORI ;  Surgeon: Ocie Doyne, DDS;  [facility];  Service: Oral Surgery;  Laterality: Bilateral;  . RIGHT/LEFT HEART CATH AND CORONARY ANGIOGRAPHY N/A 04/11/2022   Procedure: RIGHT/LEFT HEART CATH AND CORONARY ANGIOGRAPHY;  Surgeon: Corky Crafts, MD;  Location: [facility] INVASIVE CV LAB;  Service: Cardiovascular;   Laterality: N/A;  . TUBAL LIGATION  1986    Family History  Problem Relation Age of Onset  . Hypertension Mother   . Allergies Mother   . Asthma Mother   . Heart disease Mother   . Stomach cancer Mother        Deceased, 39  . Seizures Mother   . Hypertension Father        Deceased  . Hypertension Maternal Grandmother   . Healthy Brother   . Healthy Son   . Healthy Daughter     Social History   Socioeconomic History  . Marital status: Legally Separated    Spouse name: Not on file  . Number of children: Not on file  . Years of education: Not on file  . Highest education level: Not on file  Occupational History  . Not on file  Tobacco Use  . Smoking status: Former    Current packs/day: 0.00    Average packs/day: 1 pack/day for 35.0 years (35.0 ttl pk-yrs)    Types: Cigarettes    Start date: 08/25/1984    Quit date: 08/26/2019    Years since quitting: 3.9  . Smokeless tobacco: Never  Vaping Use  . Vaping status: Never Used  Substance and Sexual Activity  . Alcohol use: No    Alcohol/week: 0.0 standard drinks of alcohol  . Drug use: Not Currently    Frequency: 4.0 times per week    Types: "Crack" cocaine    Comment: RELAPSE 12/2016  . Sexual  activity: Not on file  Other Topics Concern  . Not on file  Social History Narrative   Lives with son in an apartment on the third floor.  Does not work.  On disability.  Previously worked in [employer].    Education: high school.     Social Determinants of Health   Financial Resource Strain: Not on file  Food Insecurity: Not on file  Transportation Needs: Not on file  Physical Activity: Not on file  Stress: Not on file  Social Connections: Not on file  Intimate Partner Violence: Not on file     Physical Exam   Vitals:   07/21/23 2310 07/21/23 2314  BP: (!) 161/86   Pulse: 82   Resp: 20   Temp: 99.1 F (37.3 C)   SpO2: 100% 100%    CONSTITUTIONAL: Chronically ill-appearing, NAD NEURO/PSYCH:  Alert and oriented x  3, no focal deficits EYES:  eyes equal and reactive ENT/NECK:  no LAD, no JVD CARDIO: Regular rate, well-perfused, normal S1 and S2 PULM: Diffuse rhonchi, mild tachypnea GI/GU:  non-distended, non-tender MSK/SPINE:  No gross deformities, no edema SKIN:  no rash, atraumatic   *Additional and/or pertinent findings included in MDM below  Diagnostic and Interventional Summary    EKG Interpretation Date/Time:    Ventricular Rate:    PR Interval:    QRS Duration:    QT Interval:    QTC Calculation:   R Axis:      Text Interpretation:        *** Labs Reviewed  CBC  COMPREHENSIVE METABOLIC PANEL  LIPASE, BLOOD  URINALYSIS, ROUTINE W REFLEX MICROSCOPIC  BRAIN NATRIURETIC PEPTIDE  PROTIME-INR  TROPONIN I (HIGH SENSITIVITY)    DG Chest Port 1 View    (Results Pending)    Medications  ipratropium-albuterol (DUONEB) 0.5-2.5 (3) MG/3ML nebulizer solution 3 mL (3 mLs Nebulization Given 07/21/23 2317)  acetaminophen (TYLENOL) tablet 1,000 mg (has no administration in time range)     Procedures  /  Critical Care Procedures  ED Course and Medical Decision Making  Initial Impression and Ddx Respiratory distress, history of CHF, COPD.  Suspect either COPD or CHF exacerbation.  Does not appear significantly fluid overloaded however the lungs sound more wet, there are also a few wheezes.  Pneumonia also considered.  Seems to be doing better than with EMS.  Has already received Solu-Medrol, magnesium, receiving breathing treatments currently.  Having some epigastric pain for the past few days, and so considering pancreatitis, atypical presentation of ACS, biliary colic.  Soft abdomen however.  Will monitor closely.  Past medical/surgical history that increases complexity of ED encounter:  ***  Interpretation of Diagnostics I personally reviewed the {BEROINTERP:26835} and my interpretation is as follows:  ***  ***  Patient Reassessment and Ultimate Disposition/Management      ***  Patient management required discussion with the following services or consulting groups:  {BEROCONSULT:26841}  Complexity of Problems Addressed {BEROCOPA:26833}  Additional Data Reviewed and Analyzed Further history obtained from: {BERODATA:26834}  Additional Factors Impacting ED Encounter Risk {BERORISK:26838}  Elmer Sow. Pilar Plate, MD [facility] Emergency Medicine [facility] [email or web address]  Final Clinical Impressions(s) / ED Diagnoses  No diagnosis found.  ED Discharge Orders     None        Discharge Instructions Discussed with and Provided to Patient:   Discharge Instructions   None

## 2023-07-21 NOTE — Progress Notes (Signed)
Rt called to respiratory distress

## 2023-07-21 NOTE — ED Triage Notes (Signed)
Pt from home c/o severe SOB, increasing over last 2 days. On EMS arrival pr gasping for air, sats 82% on RA. They gave 2gm Magnesium, 2 x duonebs and 125mg  solumedrol.

## 2023-07-22 LAB — CBC
HCT: 43.7 % (ref 36.0–46.0)
Hemoglobin: 13.9 g/dL (ref 12.0–15.0)
MCH: 30.7 pg (ref 26.0–34.0)
MCHC: 31.8 g/dL (ref 30.0–36.0)
MCV: 96.5 fL (ref 80.0–100.0)
Platelets: 300 10*3/uL (ref 150–400)
RBC: 4.53 MIL/uL (ref 3.87–5.11)
RDW: 12.5 % (ref 11.5–15.5)
WBC: 10.4 10*3/uL (ref 4.0–10.5)
nRBC: 0 % (ref 0.0–0.2)

## 2023-07-22 LAB — BRAIN NATRIURETIC PEPTIDE: B Natriuretic Peptide: 128.2 pg/mL — ABNORMAL HIGH (ref 0.0–100.0)

## 2023-07-22 LAB — COMPREHENSIVE METABOLIC PANEL
ALT: 20 U/L (ref 0–44)
AST: 22 U/L (ref 15–41)
Albumin: 2.9 g/dL — ABNORMAL LOW (ref 3.5–5.0)
Alkaline Phosphatase: 90 U/L (ref 38–126)
Anion gap: 14 (ref 5–15)
BUN: 6 mg/dL (ref 6–20)
CO2: 21 mmol/L — ABNORMAL LOW (ref 22–32)
Calcium: 8.7 mg/dL — ABNORMAL LOW (ref 8.9–10.3)
Chloride: 107 mmol/L (ref 98–111)
Creatinine, Ser: 0.93 mg/dL (ref 0.44–1.00)
GFR, Estimated: >60 mL/min (ref >60)
Glucose, Bld: 118 mg/dL — ABNORMAL HIGH (ref 70–99)
Potassium: 4.1 mmol/L (ref 3.5–5.1)
Sodium: 142 mmol/L (ref 135–145)
Total Bilirubin: 0.4 mg/dL (ref 0.3–1.2)
Total Protein: 5.3 g/dL — ABNORMAL LOW (ref 6.5–8.1)

## 2023-07-22 LAB — LIPASE, BLOOD: Lipase: 25 U/L (ref 11–51)

## 2023-07-22 LAB — URINALYSIS, ROUTINE W REFLEX MICROSCOPIC
Bilirubin Urine: NEGATIVE
Glucose, UA: >=500 mg/dL — AB
Hgb urine dipstick: NEGATIVE
Ketones, ur: NEGATIVE mg/dL
Nitrite: NEGATIVE
Protein, ur: NEGATIVE mg/dL
Specific Gravity, Urine: 1.011 (ref 1.005–1.030)
pH: 7 (ref 5.0–8.0)

## 2023-07-22 LAB — PROTIME-INR
INR: 1 (ref 0.8–1.2)
Prothrombin Time: 12.8 s (ref 11.4–15.2)

## 2023-07-22 LAB — TROPONIN I (HIGH SENSITIVITY)
Troponin I (High Sensitivity): 13 ng/L (ref <18)
Troponin I (High Sensitivity): 10 ng/L (ref <18)

## 2023-07-22 MED ORDER — IPRATROPIUM BROMIDE HFA 17 MCG/ACT IN AERS
2.0000 | INHALATION_SPRAY | Freq: Once | RESPIRATORY_TRACT | Status: AC
  Administered 2023-07-22: 2 via RESPIRATORY_TRACT
  Filled 2023-07-22: qty 12.9

## 2023-07-22 MED ORDER — PREDNISONE 20 MG PO TABS: 40.0000 mg | ORAL_TABLET | Freq: Every day | ORAL | 0 refills | Status: DC

## 2023-07-22 MED ORDER — ALBUTEROL SULFATE HFA 108 (90 BASE) MCG/ACT IN AERS: 2.0000 | INHALATION_SPRAY | RESPIRATORY_TRACT | 1 refills | Status: DC | PRN

## 2023-07-22 NOTE — Discharge Instructions (Addendum)
You were evaluated in the Emergency Department and after careful evaluation, we did not find any emergent condition requiring admission or further testing in the hospital.  Your exam/testing today is overall reassuring.  Symptoms likely due to a flare of your COPD.  Take the prednisone daily as prescribed, use the inhaler at home, follow-up with your primary care doctor.  Please return to the Emergency Department if you experience any worsening of your condition.   Thank you for allowing [place] to be a part of your care.

## 2023-07-24 ENCOUNTER — Other Ambulatory Visit (INDEPENDENT_AMBULATORY_CARE_PROVIDER_SITE_OTHER): Payer: Self-pay | Admitting: Primary Care

## 2023-07-24 ENCOUNTER — Telehealth: Payer: Self-pay | Admitting: Primary Care

## 2023-07-24 DIAGNOSIS — I5022 Chronic systolic (congestive) heart failure: Secondary | ICD-10-CM

## 2023-07-24 DIAGNOSIS — J4541 Moderate persistent asthma with (acute) exacerbation: Secondary | ICD-10-CM

## 2023-07-24 MED ORDER — LOSARTAN POTASSIUM 25 MG PO TABS: 25.0000 mg | ORAL_TABLET | Freq: Every day | ORAL | 0 refills | Status: DC

## 2023-07-24 NOTE — Telephone Encounter (Signed)
Medication Refill - Medication:  gabapentin (NEURONTIN) 300 MG capsule  montelukast (SINGULAIR) 10 MG tablet   Has the patient contacted their pharmacy? No. (Agent: If no, request that the patient contact the pharmacy for the refill. If patient does not wish to contact the pharmacy document the reason why and proceed with request.) (Agent: If yes, when and what did the pharmacy advise?)  Preferred Pharmacy (with phone number or street name):  [facility] [place], [place] - [address] Phone: [phone]  Fax: [phone]     Has the patient been seen for an appointment in the last year OR does the patient have an upcoming appointment? Yes.   Next OV 08/02/23  Agent: Please be advised that RX refills may take up to 3 business days. We ask that you follow-up with your pharmacy.

## 2023-07-24 NOTE — Telephone Encounter (Signed)
Returned pt call and made aware that I have nebulizer machine in the office that I can give her but will need to wait till appt for 10/9 for documentation. Pt also requested medication refill for gabapetin, losartan and other meds. Made pt aware that the refill request we received was denied because we haven't seen her since 08/04/2022.   Pt had an appt on 05/02/23 for physical she showed up and left without being seen because we were behind. Rescheduled appt for 05/08/23 and she no showed appt.   Made pt aware that I am not able to give a short supply till appointment. Pt states well if she is not at her appt on the 08/02/23 look for her at [place].

## 2023-07-24 NOTE — Telephone Encounter (Signed)
Copied from CRM [id number]. Topic: General - Inquiry >> Jul 24, 2023  8:46 AM Clide Dales wrote: Patient would like to know if provider can put an order in for her to get a nebulizer machine. Please advise.

## 2023-07-25 ENCOUNTER — Encounter (HOSPITAL_COMMUNITY): Payer: Self-pay | Admitting: Emergency Medicine

## 2023-07-25 ENCOUNTER — Emergency Department (HOSPITAL_COMMUNITY): Payer: 59

## 2023-07-25 ENCOUNTER — Inpatient Hospital Stay (HOSPITAL_COMMUNITY)
Admission: EM | Admit: 2023-07-25 | Discharge: 2023-07-27 | DRG: 191 | Disposition: A | Discharge status: Home / Self Care | Payer: 59 | Attending: Internal Medicine | Admitting: Internal Medicine

## 2023-07-25 ENCOUNTER — Other Ambulatory Visit: Payer: Self-pay

## 2023-07-25 DIAGNOSIS — Z79899 Other long term (current) drug therapy: Secondary | ICD-10-CM | POA: Diagnosis not present

## 2023-07-25 DIAGNOSIS — J45901 Unspecified asthma with (acute) exacerbation: Secondary | ICD-10-CM | POA: Diagnosis present

## 2023-07-25 DIAGNOSIS — F32A Depression, unspecified: Secondary | ICD-10-CM | POA: Diagnosis present

## 2023-07-25 DIAGNOSIS — E7800 Pure hypercholesterolemia, unspecified: Secondary | ICD-10-CM | POA: Diagnosis present

## 2023-07-25 DIAGNOSIS — F17210 Nicotine dependence, cigarettes, uncomplicated: Secondary | ICD-10-CM | POA: Diagnosis present

## 2023-07-25 DIAGNOSIS — Z635 Disruption of family by separation and divorce: Secondary | ICD-10-CM | POA: Diagnosis not present

## 2023-07-25 DIAGNOSIS — R0603 Acute respiratory distress: Secondary | ICD-10-CM

## 2023-07-25 DIAGNOSIS — I272 Pulmonary hypertension, unspecified: Secondary | ICD-10-CM | POA: Diagnosis present

## 2023-07-25 DIAGNOSIS — Z23 Encounter for immunization: Secondary | ICD-10-CM | POA: Diagnosis not present

## 2023-07-25 DIAGNOSIS — E1165 Type 2 diabetes mellitus with hyperglycemia: Secondary | ICD-10-CM | POA: Diagnosis present

## 2023-07-25 DIAGNOSIS — Z1152 Encounter for screening for COVID-19: Secondary | ICD-10-CM

## 2023-07-25 DIAGNOSIS — M109 Gout, unspecified: Secondary | ICD-10-CM | POA: Diagnosis present

## 2023-07-25 DIAGNOSIS — Z91148 Patient's other noncompliance with medication regimen for other reason: Secondary | ICD-10-CM

## 2023-07-25 DIAGNOSIS — E873 Alkalosis: Secondary | ICD-10-CM | POA: Diagnosis present

## 2023-07-25 DIAGNOSIS — F1410 Cocaine abuse, uncomplicated: Secondary | ICD-10-CM | POA: Diagnosis present

## 2023-07-25 DIAGNOSIS — Z800 Family history of malignant neoplasm of digestive organs: Secondary | ICD-10-CM

## 2023-07-25 DIAGNOSIS — Z888 Allergy status to other drugs, medicaments and biological substances status: Secondary | ICD-10-CM | POA: Diagnosis not present

## 2023-07-25 DIAGNOSIS — E1140 Type 2 diabetes mellitus with diabetic neuropathy, unspecified: Secondary | ICD-10-CM | POA: Diagnosis present

## 2023-07-25 DIAGNOSIS — J9811 Atelectasis: Secondary | ICD-10-CM | POA: Diagnosis present

## 2023-07-25 DIAGNOSIS — I5022 Chronic systolic (congestive) heart failure: Secondary | ICD-10-CM | POA: Diagnosis present

## 2023-07-25 DIAGNOSIS — Z7984 Long term (current) use of oral hypoglycemic drugs: Secondary | ICD-10-CM | POA: Diagnosis not present

## 2023-07-25 DIAGNOSIS — Z8249 Family history of ischemic heart disease and other diseases of the circulatory system: Secondary | ICD-10-CM

## 2023-07-25 DIAGNOSIS — Z825 Family history of asthma and other chronic lower respiratory diseases: Secondary | ICD-10-CM

## 2023-07-25 DIAGNOSIS — K219 Gastro-esophageal reflux disease without esophagitis: Secondary | ICD-10-CM | POA: Diagnosis present

## 2023-07-25 DIAGNOSIS — E669 Obesity, unspecified: Secondary | ICD-10-CM | POA: Diagnosis present

## 2023-07-25 DIAGNOSIS — F419 Anxiety disorder, unspecified: Secondary | ICD-10-CM | POA: Diagnosis present

## 2023-07-25 DIAGNOSIS — J4541 Moderate persistent asthma with (acute) exacerbation: Secondary | ICD-10-CM

## 2023-07-25 DIAGNOSIS — Z91199 Patient's noncompliance with other medical treatment and regimen due to unspecified reason: Secondary | ICD-10-CM

## 2023-07-25 DIAGNOSIS — I110 Hypertensive heart disease with heart failure: Secondary | ICD-10-CM | POA: Diagnosis present

## 2023-07-25 DIAGNOSIS — I428 Other cardiomyopathies: Secondary | ICD-10-CM | POA: Diagnosis present

## 2023-07-25 DIAGNOSIS — J441 Chronic obstructive pulmonary disease with (acute) exacerbation: Secondary | ICD-10-CM | POA: Diagnosis not present

## 2023-07-25 DIAGNOSIS — I2489 Other forms of acute ischemic heart disease: Secondary | ICD-10-CM | POA: Diagnosis present

## 2023-07-25 DIAGNOSIS — I081 Rheumatic disorders of both mitral and tricuspid valves: Secondary | ICD-10-CM | POA: Diagnosis present

## 2023-07-25 DIAGNOSIS — I5042 Chronic combined systolic (congestive) and diastolic (congestive) heart failure: Secondary | ICD-10-CM | POA: Diagnosis not present

## 2023-07-25 DIAGNOSIS — Z7951 Long term (current) use of inhaled steroids: Secondary | ICD-10-CM

## 2023-07-25 DIAGNOSIS — Z6834 Body mass index (BMI) 34.0-34.9, adult: Secondary | ICD-10-CM

## 2023-07-25 LAB — RESP PANEL BY RT-PCR (RSV, FLU A&B, COVID)  RVPGX2
Influenza A by PCR: NEGATIVE
Influenza B by PCR: NEGATIVE
Resp Syncytial Virus by PCR: NEGATIVE
SARS Coronavirus 2 by RT PCR: NEGATIVE

## 2023-07-25 LAB — CBG MONITORING, ED
Glucose-Capillary: 197 mg/dL — ABNORMAL HIGH (ref 70–99)
Glucose-Capillary: 195 mg/dL — ABNORMAL HIGH (ref 70–99)
Glucose-Capillary: 414 mg/dL — ABNORMAL HIGH (ref 70–99)
Glucose-Capillary: 356 mg/dL — ABNORMAL HIGH (ref 70–99)
Glucose-Capillary: 310 mg/dL — ABNORMAL HIGH (ref 70–99)
Glucose-Capillary: 282 mg/dL — ABNORMAL HIGH (ref 70–99)

## 2023-07-25 LAB — I-STAT VENOUS BLOOD GAS, ED
Acid-Base Excess: 0 mmol/L (ref 0.0–2.0)
Bicarbonate: 22.4 mmol/L (ref 20.0–28.0)
Calcium, Ion: 1.04 mmol/L — ABNORMAL LOW (ref 1.15–1.40)
HCT: 38 % (ref 36.0–46.0)
Hemoglobin: 12.9 g/dL (ref 12.0–15.0)
O2 Saturation: 98 %
Potassium: 4.9 mmol/L (ref 3.5–5.1)
Sodium: 135 mmol/L (ref 135–145)
TCO2: 23 mmol/L (ref 22–32)
pCO2, Ven: 30.3 mmHg — ABNORMAL LOW (ref 44–60)
pH, Ven: 7.476 — ABNORMAL HIGH (ref 7.25–7.43)
pO2, Ven: 101 mmHg — ABNORMAL HIGH (ref 32–45)

## 2023-07-25 LAB — COMPREHENSIVE METABOLIC PANEL WITH GFR
ALT: 20 U/L (ref 0–44)
AST: 26 U/L (ref 15–41)
Albumin: 3 g/dL — ABNORMAL LOW (ref 3.5–5.0)
Alkaline Phosphatase: 91 U/L (ref 38–126)
Anion gap: 11 (ref 5–15)
BUN: 14 mg/dL (ref 6–20)
CO2: 20 mmol/L — ABNORMAL LOW (ref 22–32)
Calcium: 8.4 mg/dL — ABNORMAL LOW (ref 8.9–10.3)
Chloride: 104 mmol/L (ref 98–111)
Creatinine, Ser: 1.13 mg/dL — ABNORMAL HIGH (ref 0.44–1.00)
GFR, Estimated: 56 mL/min — ABNORMAL LOW
Glucose, Bld: 128 mg/dL — ABNORMAL HIGH (ref 70–99)
Potassium: 4.1 mmol/L (ref 3.5–5.1)
Sodium: 135 mmol/L (ref 135–145)
Total Bilirubin: 0.6 mg/dL (ref 0.3–1.2)
Total Protein: 5.8 g/dL — ABNORMAL LOW (ref 6.5–8.1)

## 2023-07-25 LAB — CBC
HCT: 40.6 % (ref 36.0–46.0)
Hemoglobin: 12.7 g/dL (ref 12.0–15.0)
MCH: 30.4 pg (ref 26.0–34.0)
MCHC: 31.3 g/dL (ref 30.0–36.0)
MCV: 97.1 fL (ref 80.0–100.0)
Platelets: 275 K/uL (ref 150–400)
RBC: 4.18 MIL/uL (ref 3.87–5.11)
RDW: 12.6 % (ref 11.5–15.5)
WBC: 15.9 K/uL — ABNORMAL HIGH (ref 4.0–10.5)
nRBC: 0 % (ref 0.0–0.2)

## 2023-07-25 LAB — BRAIN NATRIURETIC PEPTIDE: B Natriuretic Peptide: 139.2 pg/mL — ABNORMAL HIGH (ref 0.0–100.0)

## 2023-07-25 LAB — TROPONIN I (HIGH SENSITIVITY)
Troponin I (High Sensitivity): 29 ng/L — ABNORMAL HIGH (ref <18)
Troponin I (High Sensitivity): 11 ng/L (ref <18)

## 2023-07-25 LAB — HIV ANTIBODY (ROUTINE TESTING W REFLEX): HIV Screen 4th Generation wRfx: NONREACTIVE

## 2023-07-25 MED ORDER — INSULIN ASPART 100 UNIT/ML IJ SOLN
0.0000 [IU] | Freq: Three times a day (TID) | INTRAMUSCULAR | Status: DC
  Administered 2023-07-25: 3 [IU] via SUBCUTANEOUS
  Administered 2023-07-25: 11 [IU] via SUBCUTANEOUS
  Administered 2023-07-26: 2 [IU] via SUBCUTANEOUS
  Administered 2023-07-26: 3 [IU] via SUBCUTANEOUS
  Administered 2023-07-26 – 2023-07-27 (×2): 2 [IU] via SUBCUTANEOUS
  Administered 2023-07-27: 3 [IU] via SUBCUTANEOUS

## 2023-07-25 MED ORDER — ALBUTEROL SULFATE (2.5 MG/3ML) 0.083% IN NEBU
15.0000 mg/h | INHALATION_SOLUTION | Freq: Once | RESPIRATORY_TRACT | Status: AC
  Administered 2023-07-25: 15 mg/h via RESPIRATORY_TRACT
  Filled 2023-07-25: qty 18

## 2023-07-25 MED ORDER — GABAPENTIN 300 MG PO CAPS: 300.0000 mg | ORAL_CAPSULE | Freq: Two times a day (BID) | ORAL | 0 refills | Status: DC

## 2023-07-25 MED ORDER — DAPAGLIFLOZIN PROPANEDIOL 10 MG PO TABS
10.0000 mg | ORAL_TABLET | Freq: Every day | ORAL | Status: DC
  Administered 2023-07-26 – 2023-07-27 (×2): 10 mg via ORAL
  Filled 2023-07-25 (×2): qty 1

## 2023-07-25 MED ORDER — SODIUM CHLORIDE 0.9 % IV SOLN
500.0000 mg | Freq: Every day | INTRAVENOUS | Status: AC
  Administered 2023-07-25 – 2023-07-27 (×3): 500 mg via INTRAVENOUS
  Filled 2023-07-25 (×3): qty 5

## 2023-07-25 MED ORDER — FUROSEMIDE 10 MG/ML IJ SOLN
40.0000 mg | Freq: Once | INTRAMUSCULAR | Status: AC
  Administered 2023-07-25: 40 mg via INTRAVENOUS
  Filled 2023-07-25: qty 4

## 2023-07-25 MED ORDER — HEPARIN SODIUM (PORCINE) 5000 UNIT/ML IJ SOLN
5000.0000 [IU] | Freq: Three times a day (TID) | INTRAMUSCULAR | Status: DC
  Administered 2023-07-25 – 2023-07-27 (×6): 5000 [IU] via SUBCUTANEOUS
  Filled 2023-07-25 (×6): qty 1

## 2023-07-25 MED ORDER — IPRATROPIUM-ALBUTEROL 0.5-2.5 (3) MG/3ML IN SOLN
3.0000 mL | RESPIRATORY_TRACT | Status: DC
  Administered 2023-07-25 – 2023-07-27 (×10): 3 mL via RESPIRATORY_TRACT
  Filled 2023-07-25 (×11): qty 3

## 2023-07-25 MED ORDER — PREDNISONE 20 MG PO TABS
40.0000 mg | ORAL_TABLET | Freq: Every day | ORAL | Status: DC
  Administered 2023-07-26 – 2023-07-27 (×2): 40 mg via ORAL
  Filled 2023-07-25 (×2): qty 2

## 2023-07-25 MED ORDER — CARVEDILOL 3.125 MG PO TABS
3.1250 mg | ORAL_TABLET | Freq: Two times a day (BID) | ORAL | Status: DC
  Administered 2023-07-25 – 2023-07-27 (×4): 3.125 mg via ORAL
  Filled 2023-07-25 (×4): qty 1

## 2023-07-25 MED ORDER — MONTELUKAST SODIUM 10 MG PO TABS
10.0000 mg | ORAL_TABLET | Freq: Every day | ORAL | 0 refills | Status: DC
Start: 2023-07-25 — End: 2023-07-27

## 2023-07-25 MED ORDER — NITROGLYCERIN 2 % TD OINT
0.5000 [in_us] | TOPICAL_OINTMENT | Freq: Once | TRANSDERMAL | Status: DC
  Filled 2023-07-25: qty 1

## 2023-07-25 NOTE — Telephone Encounter (Signed)
Requested Prescriptions  Pending Prescriptions Disp Refills   gabapentin (NEURONTIN) 300 MG capsule 180 capsule 0    Sig: Take 1 capsule (300 mg total) by mouth 2 (two) times daily.     Neurology: Anticonvulsants - gabapentin Passed - 07/24/2023  9:25 AM      Passed - Cr in normal range and within 360 days    Creat  Date Value Ref Range Status  09/16/2015 1.49 (H) 0.50 - 1.05 mg/dL Final   Creatinine, Ser  Date Value Ref Range Status  07/21/2023 0.93 0.44 - 1.00 mg/dL Final   Creatinine,U  Date Value Ref Range Status  08/05/2009 115.4 mg/dL Final    Comment:    See lab report for associated comment(s)         Passed - Completed PHQ-2 or PHQ-9 in the last 360 days      Passed - Valid encounter within last 12 months    Recent Outpatient Visits           11 months ago Vaginal itching   [place] [facility] Grayce Sessions, NP   1 year ago Pruritus   [place] [facility] Grayce Sessions, NP   1 year ago Asthmatic bronchitis with exacerbation, moderate persistent   [place] [facility] Grayce Sessions, NP   1 year ago Type 2 diabetes mellitus without complication, without long-term current use of insulin (HCC)   [place] [facility] Grayce Sessions, NP   2 years ago Type 2 diabetes mellitus without complication, without long-term current use of insulin (HCC)   [place] [facility] Grayce Sessions, NP       Future Appointments             In 1 week Grayce Sessions, NP [place] [facility]             montelukast (SINGULAIR) 10 MG tablet 90 tablet 0    Sig: Take 1 tablet (10 mg total) by mouth at bedtime.     Pulmonology:  Leukotriene Inhibitors Passed - 07/24/2023  9:25 AM      Passed - Valid encounter within last 12 months    Recent Outpatient Visits           11 months ago Vaginal itching   [place] [facility] Grayce Sessions, NP   1 year ago Pruritus   [place] [facility] Grayce Sessions, NP   1 year ago Asthmatic bronchitis with exacerbation, moderate persistent   [place] [facility] Grayce Sessions, NP   1 year ago Type 2 diabetes mellitus without complication, without long-term current use of insulin (HCC)   [place] [facility] Grayce Sessions, NP   2 years ago Type 2 diabetes mellitus without complication, without long-term current use of insulin [facility])    [facility] Gray[facility] Sessions, NP       Future Appointments             In 1 week Randa Evens, Kinnie Scales, NP  [facility]

## 2023-07-25 NOTE — Progress Notes (Signed)
Pt placed on Bipap due to respiratory distress. Pt tolerating well at this time
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
## 2023-07-25 NOTE — ED Provider Notes (Signed)
[place] EMERGENCY DEPARTMENT AT [facility] Provider Note   CSN: [id number] Arrival date & time: 07/25/23  6213     History  Chief Complaint  Patient presents with   Respiratory Distress    Stacey Lin is a 57 y.o. female.  Pt is a 57y/o with hx CHF last EF in 6/23 30-35%, COPD, HTN who is presenting today with sudden onset of shortness of breath.  It was reported by EMS as patient is in distress and unable to answer any questions that she became significantly short of breath this morning.  Patient had been noted to call her doctor multiple times trying to get her medications since she was seen on 27 September in the emergency room.  Unclear if she has had fever or productive cough.  When EMS arrived patient was in respiratory distress.  She received 10 of albuterol, 0.5 of Atrovent, 125 mg of Solu-Medrol and 2 g of magnesium.  Patient initially was hypertensive but improved after BiPAP was started immediately when she arrived.  Currently patient is sleeping on BiPAP and will open her eyes but does not attempt to give any history  The history is provided by the patient.       Home Medications Prior to Admission medications   Medication Sig Start Date End Date Taking? Authorizing Provider  ACCU-CHEK GUIDE test strip TEST THREE TIMES DAILY 07/21/21   Ivonne Andrew, NP  Accu-Chek Softclix Lancets lancets TEST BLOOD SUGAR THREE TIMES DAILY 07/21/21   Ivonne Andrew, NP  acetaminophen (TYLENOL) 500 MG tablet Take 500 mg by mouth every 6 (six) hours as needed for moderate pain or headache.    [provider]  albuterol (VENTOLIN HFA) 108 (90 Base) MCG/ACT inhaler Inhale 2 puffs into the lungs every 4 (four) hours as needed for wheezing or shortness of breath. 07/22/23   Sabas Sous, MD  allopurinol (ZYLOPRIM) 100 MG tablet TAKE ONE TABLET BY MOUTH TWICE DAILY 01/02/23   Grayce Sessions, NP  azelastine (ASTELIN) 0.1 % nasal spray Place 2 sprays into both  nostrils 2 (two) times daily. Use in each nostril as directed 01/17/22   Cobb, Ruby Cola, NP  Blood Glucose Monitoring Suppl (ACCU-CHEK AVIVA PLUS) w/Device KIT 1 each by Does not apply route 3 (three) times daily. 05/08/20   Hoy Register, MD  Boric Acid Vaginal 600 MG SUPP PLACE 1 SUPPOSITORY VAGINALLY TWICE DAILY *REFILL REQUEST* 06/21/23   Grayce Sessions, NP  budesonide-formoterol (SYMBICORT) 80-4.5 MCG/ACT inhaler INHALE TWO (2) PUFFS BY MOUTH TWICE DAILY AS NEEDED FOR SHORTNESS OF BREATH AND/OR WHEEZING *REFILL REQUEST* 06/21/23   Grayce Sessions, NP  carvedilol (COREG) 3.125 MG tablet Take 1 tablet (3.125 mg total) by mouth 2 (two) times daily with a meal. 06/29/23   Bensimhon, Bevelyn Buckles, MD  dapagliflozin propanediol (FARXIGA) 10 MG TABS tablet TAKE 1 TABLET BY MOUTH ONCE DAILY 07/24/23   Bensimhon, Bevelyn Buckles, MD  escitalopram (LEXAPRO) 20 MG tablet TAKE ONE TABLET BY MOUTH ONCE DAILY 01/02/23   Grayce Sessions, NP  famotidine (PEPCID) 20 MG tablet One after supper 03/05/21   Nyoka Cowden, MD  furosemide (LASIX) 40 MG tablet TAKE ONE TABLET BY MOUTH TWICE DAILY 08/09/22   Bensimhon, Bevelyn Buckles, MD  furosemide (LASIX) 40 MG tablet TAKE ONE TABLET BY MOUTH TWICE DAILY * Please calls and schedule appt for further refills 06/06/23   Bensimhon, Bevelyn Buckles, MD  gabapentin (NEURONTIN) 300 MG capsule Take 1 capsule (  300 mg total) by mouth 2 (two) times daily. 07/25/23   Grayce Sessions, NP  hydrOXYzine (ATARAX) 10 MG tablet TAKE ONE TABLET BY MOUTH THREE TIMES DAILY AS NEEDED 01/02/23   Grayce Sessions, NP  lidocaine (LIDODERM) 5 % Place 1 patch onto the skin daily. Remove & Discard patch within 12 hours or as directed by MD 08/25/22   Tilden Fossa, MD  losartan (COZAAR) 25 MG tablet Take 1 tablet (25 mg total) by mouth daily. 07/24/23   Grayce Sessions, NP  metFORMIN (GLUCOPHAGE-XR) 500 MG 24 hr tablet TAKE ONE TABLET BY MOUTH ONCE DAILY 01/02/23   Grayce Sessions, NP  montelukast  (SINGULAIR) 10 MG tablet Take 1 tablet (10 mg total) by mouth at bedtime. 07/25/23   Grayce Sessions, NP  naloxone [facility]) nasal spray 4 mg/0.1 mL Inject intranasal for overdose 04/29/23   Rondel Baton, MD  predniSONE (DELTASONE) 20 MG tablet Take 2 tablets (40 mg total) by mouth daily for 5 days. 07/22/23 07/27/23  Sabas Sous, MD  spironolactone (ALDACTONE) 25 MG tablet TAKE ONE TABLET BY MOUTH ONCE DAILY 08/25/22   Bensimhon, Bevelyn Buckles, MD  tiZANidine (ZANAFLEX) 2 MG tablet Take 1 tablet (2 mg total) by mouth every 8 (eight) hours as needed for muscle spasms. 08/25/22   Tilden Fossa, MD      Allergies    Ketoconazole    Review of Systems   Review of Systems  Physical Exam Updated Vital Signs BP 112/76   Pulse 87   Temp 98.3 F (36.8 C) (Temporal)   Resp 16   Ht 5\' 1"  (1.549 m)   Wt 114 kg   LMP  (LMP Unknown)   SpO2 91%   BMI 47.49 kg/m  Physical Exam Vitals and nursing note reviewed.  Constitutional:      General: She is not in acute distress.    Appearance: She is well-developed.  HENT:     Head: Normocephalic and atraumatic.  Eyes:     Conjunctiva/sclera: Conjunctivae normal.     Pupils: Pupils are equal, round, and reactive to light.  Neck:     Comments: Mild JVD while sitting upright Cardiovascular:     Rate and Rhythm: Normal rate and regular rhythm.     Pulses: Normal pulses.     Heart sounds: No murmur heard. Pulmonary:     Effort: Pulmonary effort is normal. No respiratory distress.     Breath sounds: Wheezing present. No rales.  Abdominal:     General: There is no distension.     Palpations: Abdomen is soft.     Tenderness: There is no abdominal tenderness. There is no guarding or rebound.  Musculoskeletal:        General: No tenderness. Normal range of motion.     Cervical back: Normal range of motion and neck supple.     Right lower leg: Edema present.     Left lower leg: Edema present.     Comments: 1+ pitting edema at the ankles  bilaterally  Skin:    General: Skin is warm and dry.     Findings: No erythema or rash.  Neurological:     Mental Status: She is alert.     Comments: Sleepy but will open eyes to voice and noted to move all ext when she arrived  Psychiatric:        Behavior: Behavior normal.     ED Results / Procedures / Treatments   Labs (all  labs ordered are listed, but only abnormal results are displayed) Labs Reviewed  COMPREHENSIVE METABOLIC PANEL - Abnormal; Notable for the following components:      Result Value   CO2 20 (*)    Glucose, Bld 128 (*)    Creatinine, Ser 1.13 (*)    Calcium 8.4 (*)    Total Protein 5.8 (*)    Albumin 3.0 (*)    GFR, Estimated 56 (*)    All other components within normal limits  CBC - Abnormal; Notable for the following components:   WBC 15.9 (*)    All other components within normal limits  BRAIN NATRIURETIC PEPTIDE - Abnormal; Notable for the following components:   B Natriuretic Peptide 139.2 (*)    All other components within normal limits  I-STAT VENOUS BLOOD GAS, ED - Abnormal; Notable for the following components:   pH, Ven 7.476 (*)    pCO2, Ven 30.3 (*)    pO2, Ven 101 (*)    Calcium, Ion 1.04 (*)    All other components within normal limits  TROPONIN I (HIGH SENSITIVITY) - Abnormal; Notable for the following components:   Troponin I (High Sensitivity) 29 (*)    All other components within normal limits  RESP PANEL BY RT-PCR (RSV, FLU A&B, COVID)  RVPGX2  TROPONIN I (HIGH SENSITIVITY)    EKG EKG Interpretation Date/Time:  Tuesday July 25 2023 06:44:38 EDT Ventricular Rate:  104 PR Interval:  156 QRS Duration:  74 QT Interval:  378 QTC Calculation: 498 R Axis:   55  Text Interpretation: Sinus tachycardia Probable left atrial enlargement Nonspecific T abnrm, anterolateral leads Borderline prolonged QT interval No significant change since last tracing Confirmed by Gwyneth Sprout ([id number]) on 07/25/2023 7:27:27 AM  Radiology DG  Chest Port 1 View  Result Date: 07/25/2023 CLINICAL DATA:  Shortness of breath EXAM: PORTABLE CHEST 1 VIEW COMPARISON:  07/21/2023 FINDINGS: Chronic cardiomegaly. Indistinct density at the lung bases not seen on prior. No Kerley lines, effusion, or pneumothorax. Artifact from EKG leads. No acute osseous finding IMPRESSION: Atelectasis or early infiltrate in the lower lungs. Chronic cardiomegaly. Electronically Signed   By: Tiburcio Pea M.D.   On: 07/25/2023 07:34    Procedures Procedures    Medications Ordered in ED Medications  nitroGLYCERIN (NITROGLYN) 2 % ointment 0.5 inch (0 inches Topical Hold 07/25/23 0653)  albuterol (PROVENTIL) (2.5 MG/3ML) 0.083% nebulizer solution (15 mg/hr Nebulization Given 07/25/23 0808)    ED Course/ Medical Decision Making/ A&P                                 Medical Decision Making Amount and/or Complexity of Data Reviewed Independent Historian: EMS External Data Reviewed: notes. Labs: ordered. Decision-making details documented in ED Course. Radiology: ordered and independent interpretation performed. Decision-making details documented in ED Course. ECG/medicine tests: ordered and independent interpretation performed. Decision-making details documented in ED Course.  Risk Prescription drug management. Decision regarding hospitalization.   Pt with multiple medical problems and comorbidities and presenting today with a complaint that caries a high risk for morbidity and mortality.  Patient presenting in respiratory distress today.  Concern for CHF exacerbation versus COPD exacerbation given patient's significant medical history.  Also based on recent notes to her doctor it seems that patient may not have her medications.  Patient has some findings of fluid overload with mild edema in the legs and mild JVD with sitting upright.  Unclear  if she has had any infectious symptoms but is afebrile here and lower suspicion for pneumonia or acute abdominal  process.  Patient required BiPAP upon arrival and is now resting comfortably however does seem very sleepy but will open eyes to stimulation.  Will ensure no hypercarbic component.  Patient has already received albuterol, Atrovent, Solu-Medrol and magnesium.  She is still currently wheezing and we will do an hour-long continuous neb.  Labs are pending.  I independently interpreted patient's EKG and labs.  EKG without significant changes but does have some sinus tachycardia which did improve after going on BiPAP, VBG with CO2 of 30 and normal pH and bicarb.  CBC with leukocytosis of 15 but normal hemoglobin, viral panel negative, CMP without acute findings, troponin is mildly elevated today at 29 most likely from secondary causes related to respiratory distress.  BNP relatively normal at 139.  I have independently visualized and interpreted pt's images today.  Chest x-ray with cardiomegaly which is unchanged and no definitive pneumonia.  After an hour-long continuous neb on repeat evaluation wheezing has improved but is still present.  Patient remains on BiPAP but is comfortable at this time with sats in the low 90s.  Discussed this with the patient and her family.  Mental status remains unchanged she does wake up easily to voice now.  Feel that patient is stable for admission to stepdown.  Not ready to wean BiPAP at this time.  Consulted unassigned medicine for admission.  Patient and family are comfortable with this plan.  CRITICAL CARE Performed by: Narcissus Detwiler Total critical care time: 30 minutes Critical care time was exclusive of separately billable procedures and treating other patients. Critical care was necessary to treat or prevent imminent or life-threatening deterioration. Critical care was time spent personally by me on the following activities: development of treatment plan with patient and/or surrogate as well as nursing, discussions with consultants, evaluation of patient's response to  treatment, examination of patient, obtaining history from patient or surrogate, ordering and performing treatments and interventions, ordering and review of laboratory studies, ordering and review of radiographic studies, pulse oximetry and re-evaluation of patient's condition.         Final Clinical Impression(s) / ED Diagnoses Final diagnoses:  COPD exacerbation (HCC)  Respiratory distress    Rx / DC Orders ED Discharge Orders     None         Gwyneth Sprout, MD 07/25/23 1006

## 2023-07-25 NOTE — Consult Note (Addendum)
Cardiology Consultation   Patient ID: Stacey Lin MRN: [id number]; DOB: [birth year]  Admit date: 07/25/2023 Date of Consult: 07/25/2023  PCP:  Grayce Sessions, NP   [facility] Providers Cardiologist:  None      Patient Profile:   Stacey Lin is a 57 y.o. female with a hx of CHF, HLD, HTN, mitral valve regurgitation, polysubstance abuse, asthma, COPD, anemia who is being seen 07/25/2023 for the evaluation of CHF at the request of Dr. Criselda Peaches.  History of Present Illness:   Stacey Lin is a 57 year old female with above medical history. Per chart review, patient was previously followed by the advanced heart failure clinic for combined systolic and diastolic heart failure. Echocardiogram in 07/2013 showed EF 30-35%. underwent cardiac catheterization in 05/2014 that showed normal coronary arteries, and her cardiomyopathy was thought to be nonischemic. Echocardiogram 03/2018 showed EF 55-65%, no regional wall motion abnormalities, grade I DD, mild-moderate AI, mild MR. As her EF had normalized, she was released from the heart failure clinic and was followed by [facility].   Patient was admitted in 03/2022 with decompensated heart failure. Echocardiogram 04/07/22 showed EF 30-35%, grade I DD, normal RV function, mildly elevated pulmonary artery systolic pressure, severe biatrial enlargement, moderate-severe MR, moderate to severe TR. Patient was treated with IV diuretics. She underwent R/L heart catheterization on 04/11/22 that showed severe LV systolic dysfunction, mild pulmonary HTN, no angiographically apparent coronary artery disease. Patient was discharged on lasix, farxiga, losartan, carvedilol. Was not started on entresto as patient had previously had cough on entresto. A follow up appointment was arranged, but patient did not show.   Patient was recently seen in the ED on 07/21/23 for evaluation of abdominal pain, SOB, and diarrhea. Patient presented with significant shortness  of breath that improved with breathing treatments. Labs were reassuring without significant WBC or electrolyte disturbance. BNP minimally elevated and troponin negative. COPD exacerbation suspected. Patient improved in the ED and was discharged   Patient presented to the ED on 10/1 in respiratory distress. With EMS, patient had been give albuterol, atrovent, solumedrol, mag. She required BIPAP in the ED. Labs in the ED showed K 4.1, creatinine 1.13, albumin 3, WBC 15.9, hemoglobin 12.7, platelets 275. COVID, Flu, RSV negative. BNP mildly elevated to 139. hsTn 29, 11. CXR showed atelectasis or early infiltrate in the lower lungs. Patient was admitted to the teaching service for treatment of COPD exacerbation. Cardiology consulted   On interview, patient is very sleepy. She does arouse easily to voice, but fell asleep a few times during the conversation. Patient's main complaint is shortness of breath. Does note improvement in breathing with nebulizer treatments. Does have some orthopnea and mild ankle edema. Admits to recent cocaine use, but she would like to stop using cocaine . Reports that she has tried to quit in the past, but feels like her health gets worse when she stops using cocaine. Reports compliance with her medications because she gets them in a pill pack   Past Medical History:  Diagnosis Date   Anemia    Anginal pain (HCC)    2 months   Arrhythmia    Asthma    CHF (congestive heart failure) (HCC)    1990s   Chronic headaches    COPD (chronic obstructive pulmonary disease) (HCC)    ??   Hypercholesteremia    Hypertension     Past Surgical History:  Procedure Laterality Date   LEFT AND RIGHT HEART CATHETERIZATION WITH CORONARY ANGIOGRAM N/A  06/06/2014   Procedure: LEFT AND RIGHT HEART CATHETERIZATION WITH CORONARY ANGIOGRAM;  Surgeon: Dolores Patty, MD;  Location: [facility] C[facility];  Service: Cardiovascular;  Laterality: N/A;   MULTIPLE EXTRACTIONS WITH ALVEOLOPLASTY Bilateral  07/24/2015   Procedure: MULTIPLE EXTRACTIONS WITH ALVEOLOPLASTY,  BILATERAL TORI ;  Surgeon: Ocie Doyne, DDS;  Location: [facility] OR;  Service: Oral Surgery;  Laterality: Bilateral;   RIGHT/LEFT HEART CATH AND CORONARY ANGIOGRAPHY N/A 04/11/2022   Procedure: RIGHT/LEFT HEART CATH AND CORONARY ANGIOGRAPHY;  Surgeon: Corky Crafts, MD;  Location: [facility] INVASIVE CV LAB;  Service: Cardiovascular;  Laterality: N/A;   TUBAL LIGATION  1986     Home Medications:  Prior to Admission medications   Medication Sig Start Date End Date Taking? Authorizing Provider  ACCU-CHEK GUIDE test strip TEST THREE TIMES DAILY 07/21/21   Ivonne Andrew, NP  Accu-Chek Softclix Lancets lancets TEST BLOOD SUGAR THREE TIMES DAILY 07/21/21   Ivonne Andrew, NP  acetaminophen (TYLENOL) 500 MG tablet Take 500 mg by mouth every 6 (six) hours as needed for moderate pain or headache.    [provider]  albuterol (VENTOLIN HFA) 108 (90 Base) MCG/ACT inhaler Inhale 2 puffs into the lungs every 4 (four) hours as needed for wheezing or shortness of breath. 07/22/23   Sabas Sous, MD  allopurinol (ZYLOPRIM) 100 MG tablet TAKE ONE TABLET BY MOUTH TWICE DAILY 01/02/23   Grayce Sessions, NP  azelastine (ASTELIN) 0.1 % nasal spray Place 2 sprays into both nostrils 2 (two) times daily. Use in each nostril as directed 01/17/22   Cobb, Ruby Cola, NP  Blood Glucose Monitoring Suppl (ACCU-CHEK AVIVA PLUS) w/Device KIT 1 each by Does not apply route 3 (three) times daily. 05/08/20   Hoy Register, MD  Boric Acid Vaginal 600 MG SUPP PLACE 1 SUPPOSITORY VAGINALLY TWICE DAILY *REFILL REQUEST* 06/21/23   Grayce Sessions, NP  budesonide-formoterol (SYMBICORT) 80-4.5 MCG/ACT inhaler INHALE TWO (2) PUFFS BY MOUTH TWICE DAILY AS NEEDED FOR SHORTNESS OF BREATH AND/OR WHEEZING *REFILL REQUEST* 06/21/23   Grayce Sessions, NP  carvedilol (COREG) 3.125 MG tablet Take 1 tablet (3.125 mg total) by mouth 2 (two) times daily with a meal.  06/29/23   Bensimhon, Bevelyn Buckles, MD  dapagliflozin propanediol (FARXIGA) 10 MG TABS tablet TAKE 1 TABLET BY MOUTH ONCE DAILY 07/24/23   Bensimhon, Bevelyn Buckles, MD  escitalopram (LEXAPRO) 20 MG tablet TAKE ONE TABLET BY MOUTH ONCE DAILY 01/02/23   Grayce Sessions, NP  famotidine (PEPCID) 20 MG tablet One after supper 03/05/21   Nyoka Cowden, MD  furosemide (LASIX) 40 MG tablet TAKE ONE TABLET BY MOUTH TWICE DAILY 08/09/22   Bensimhon, Bevelyn Buckles, MD  furosemide (LASIX) 40 MG tablet TAKE ONE TABLET BY MOUTH TWICE DAILY * Please calls and schedule appt for further refills 06/06/23   Bensimhon, Bevelyn Buckles, MD  gabapentin (NEURONTIN) 300 MG capsule Take 1 capsule (300 mg total) by mouth 2 (two) times daily. 07/25/23   Grayce Sessions, NP  hydrOXYzine (ATARAX) 10 MG tablet TAKE ONE TABLET BY MOUTH THREE TIMES DAILY AS NEEDED 01/02/23   Grayce Sessions, NP  lidocaine (LIDODERM) 5 % Place 1 patch onto the skin daily. Remove & Discard patch within 12 hours or as directed by MD 08/25/22   Tilden Fossa, MD  losartan (COZAAR) 25 MG tablet Take 1 tablet (25 mg total) by mouth daily. 07/24/23   Grayce Sessions, NP  metFORMIN (GLUCOPHAGE-XR) 500 MG 24 hr tablet  TAKE ONE TABLET BY MOUTH ONCE DAILY 01/02/23   Grayce Sessions, NP  montelukast (SINGULAIR) 10 MG tablet Take 1 tablet (10 mg total) by mouth at bedtime. 07/25/23   Grayce Sessions, NP  naloxone [facility]) nasal spray 4 mg/0.1 mL Inject intranasal for overdose 04/29/23   Rondel Baton, MD  predniSONE (DELTASONE) 20 MG tablet Take 2 tablets (40 mg total) by mouth daily for 5 days. 07/22/23 07/27/23  Sabas Sous, MD  spironolactone (ALDACTONE) 25 MG tablet TAKE ONE TABLET BY MOUTH ONCE DAILY 08/25/22   Bensimhon, Bevelyn Buckles, MD  tiZANidine (ZANAFLEX) 2 MG tablet Take 1 tablet (2 mg total) by mouth every 8 (eight) hours as needed for muscle spasms. 08/25/22   Tilden Fossa, MD    Inpatient Medications: Scheduled Meds:  heparin  5,000 Units  Subcutaneous Q8H   insulin aspart  0-15 Units Subcutaneous TID WC   ipratropium-albuterol  3 mL Nebulization Q4H   nitroGLYCERIN  0.5 inch Topical Once   [START ON 07/26/2023] predniSONE  40 mg Oral Q breakfast   Continuous Infusions:  azithromycin Stopped (07/25/23 1305)   PRN Meds:   Allergies:    Allergies  Allergen Reactions   Ketoconazole Rash    Social History:   Social History   Socioeconomic History   Marital status: Legally Separated    Spouse name: Not on file   Number of children: Not on file   Years of education: Not on file   Highest education level: Not on file  Occupational History   Not on file  Tobacco Use   Smoking status: Former    Current packs/day: 0.00    Average packs/day: 1 pack/day for 35.0 years (35.0 ttl pk-yrs)    Types: Cigarettes    Start date: 08/25/1984    Quit date: 08/26/2019    Years since quitting: 3.9   Smokeless tobacco: Never  Vaping Use   Vaping status: Never Used  Substance and Sexual Activity   Alcohol use: No    Alcohol/week: 0.0 standard drinks of alcohol   Drug use: Not Currently    Frequency: 4.0 times per week    Types: "Crack" cocaine    Comment: RELAPSE 12/2016   Sexual activity: Not on file  Other Topics Concern   Not on file  Social History Narrative   Lives with son in an apartment on the third floor.  Does not work.  On disability.  Previously worked in [employer].    Education: high school.     Social Determinants of Health   Financial Resource Strain: Not on file  Food Insecurity: Not on file  Transportation Needs: Not on file  Physical Activity: Not on file  Stress: Not on file  Social Connections: Not on file  Intimate Partner Violence: Not on file    Family History:    Family History  Problem Relation Age of Onset   Hypertension Mother    Allergies Mother    Asthma Mother    Heart disease Mother    Stomach cancer Mother        Deceased, 25   Seizures Mother    Hypertension Father         Deceased   Hypertension Maternal Grandmother    Healthy Brother    Healthy Son    Healthy Daughter      ROS:  Please see the history of present illness.   All other ROS reviewed and negative.     Physical Exam/Data:  Vitals:   07/25/23 1230 07/25/23 1245 07/25/23 1330 07/25/23 1400  BP: 112/64 119/70 101/76 103/66  Pulse: 92 84 95 92  Resp: 20 18 17 14   Temp:      TempSrc:      SpO2: 90% 97% 93% 94%  Weight:      Height:        Intake/Output Summary (Last 24 hours) at 07/25/2023 1522 Last data filed at 07/25/2023 1246 Gross per 24 hour  Intake --  Output 500 ml  Net -500 ml      07/25/2023    6:51 AM 07/21/2023   11:12 PM 04/29/2023    7:36 AM  Last 3 Weights  Weight (lbs) 251 lb 5.2 oz 251 lb 5.2 oz 250 lb  Weight (kg) 114 kg 114 kg 113.399 kg     Body mass index is 47.49 kg/m.  General:  Well nourished. Sleepy. Arouses easily to voice. No acute distress  HEENT: normal Neck: no JVD Vascular: Radial pulses 2+ bilaterally Cardiac:  normal S1, S2; RRR; no murmur  Lungs: Inspiratory and expiratory wheezing present throughout. Normal work of breathing on [place]  Abd: soft, nontender Ext: Trace edema in BLE  Musculoskeletal:  No deformities, BUE and BLE strength normal and equal Skin: warm and dry  Neuro:  CNs 2-12 intact, no focal abnormalities noted Psych:  Normal affect   EKG:  The EKG was personally reviewed and demonstrates:  Sinus tachycardia, HR 104 BPM, nonspecific T wave abnormalities in lateral leads  Telemetry:  Telemetry was personally reviewed and demonstrates:  NSR   Relevant CV Studies:   Laboratory Data:  High Sensitivity Troponin:   Recent Labs  Lab 07/21/23 2304 07/22/23 0135 07/25/23 0710 07/25/23 1115  TROPONINIHS 13 10 29* 11     Chemistry Recent Labs  Lab 07/21/23 2304 07/25/23 0710 07/25/23 0715  NA 142 135 135  K 4.1 4.1 4.9  CL 107 104  --   CO2 21* 20*  --   GLUCOSE 118* 128*  --   BUN 6 14  --   CREATININE 0.93 1.13*   --   CALCIUM 8.7* 8.4*  --   GFRNONAA >60 56*  --   ANIONGAP 14 11  --     Recent Labs  Lab 07/21/23 2304 07/25/23 0710  PROT 5.3* 5.8*  ALBUMIN 2.9* 3.0*  AST 22 26  ALT 20 20  ALKPHOS 90 91  BILITOT 0.4 0.6   Lipids No results for input(s): "CHOL", "TRIG", "HDL", "LABVLDL", "LDLCALC", "CHOLHDL" in the last 168 hours.  Hematology Recent Labs  Lab 07/21/23 2304 07/25/23 0710 07/25/23 0715  WBC 10.4 15.9*  --   RBC 4.53 4.18  --   HGB 13.9 12.7 12.9  HCT 43.7 40.6 38.0  MCV 96.5 97.1  --   MCH 30.7 30.4  --   MCHC 31.8 31.3  --   RDW 12.5 12.6  --   PLT 300 275  --    Thyroid No results for input(s): "TSH", "FREET4" in the last 168 hours.  BNP Recent Labs  Lab 07/21/23 2304 07/25/23 0703  BNP 128.2* 139.2*    DDimer No results for input(s): "DDIMER" in the last 168 hours.   Radiology/Studies:  DG Chest Port 1 View  Result Date: 07/25/2023 CLINICAL DATA:  Shortness of breath EXAM: PORTABLE CHEST 1 VIEW COMPARISON:  07/21/2023 FINDINGS: Chronic cardiomegaly. Indistinct density at the lung bases not seen on prior. No Kerley lines, effusion, or pneumothorax. Artifact from EKG leads.  No acute osseous finding IMPRESSION: Atelectasis or early infiltrate in the lower lungs. Chronic cardiomegaly. Electronically Signed   By: Tiburcio Pea M.D.   On: 07/25/2023 07:34   DG Chest Port 1 View  Result Date: 07/21/2023 CLINICAL DATA:  Shortness of breath EXAM: PORTABLE CHEST 1 VIEW COMPARISON:  04/29/2023 FINDINGS: Mild cardiomegaly with aortic atherosclerosis. No focal airspace disease, pleural effusion, or pneumothorax. IMPRESSION: Mild cardiomegaly. Electronically Signed   By: Jasmine Pang M.D.   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 BNP elevated to 139. CXR with atelectasis vs early infiltrate in lung bases  - Agree with IV lasix 40 mg today. Follow urine output, recheck BMP in AM  - BP in the 110s/70s in the ED. Resume home carvedilol 3.125 mg BID and farxiga 10 mg daily. Plan to resume losartan 25 mg daily when BP allows  - In the past, patient did not tolerate entresto due to cough  - Repeat echocardiogram pending   HTN - BP in the 110s/70s after IV lasix today  - Resume carvedilol as above - Plan to resume losartan when BP allows   Elevated Troponin  - hsTn 29>11 - Down trending and very minimally elevated- not consistent with ACS. Suspect demand ischemia in the setting of COPD exacervation  - Cardiac catheterization from 03/2022 was without CAD  - Echocardiogram pending   History of Substance Abuse  - Drug screens have often been positive for cocaine  - Patient admits to recent cocaine use. Discussed the detrimental affects of cocaine on the heart. Patient reports that she wants to quit, but she is having a hard time - Patient requested resources to help quit cocaine- may consult social work later this admission   Otherwise per primary  - COPD exacerbation  - Depression, anxiety  - Type 2 DM with diabetic neuropathy  - Malnutrition    Risk Assessment/Risk Scores:   New York Heart Association (NYHA) Functional Class NYHA Class III     For questions or updates, please contact [facility] Please consult [email or web address] for contact info under    Signed, Jonita Albee, PA-C  07/25/2023 3:22 PM  History and all data above reviewed.  Patient examined.  I agree with the findings as above.  The patient presents with acute on chronic respiratory failure.  She has a history of a cardiomyopathy as above.  She also likely has some chronic lung disease.  She has some ongoing tobacco use.  She  presents with acute dyspnea.  She has had cough productive of some white or "colored" sputum.  She says she has been hot and cold but cannot take her temperature.  She does not have a scale.  She does eat a lot of deliver fast food.  On presentation she has a minimally elevated BNP.  Troponin has trended up very slightly but come back down quickly.  There are no acute EKG changes.  The patient exam reveals COR: Regular rate and rhythm, no murmurs,  Lungs: Diffuse wheezing,  Abd: Positive bowel sounds normal frequency pitch, bruits, rebound, guarding, Ext trace edema.  All available labs, radiology testing, previous records reviewed. Agree with documented assessment and  plan.  Acute respiratory failure: This is likely more a primary pulmonary process and cardiac.  Certainly she could have some vascular congestion given her chronic diastolic heart failure.  She did get a dose of IV Lasix and then we can follow in the morning to look at her creatinine.  I would resume her home meds as above.  She needs tobacco cessation and substance given to physician cessation.  I agree with bronchodilators and steroids.  Her minimally elevated troponin is not diagnostic and not suggestive of an acute coronary syndrome.  No ischemia workup is suggested.  Repeat echo is pending.  Stacey Lin  4:52 PM  07/25/2023

## 2023-07-25 NOTE — ED Triage Notes (Signed)
Patient arrives in respiratory distress via [facility] on nebulizer treatment, tachypneic, slower to respond initially only to painful stimuli. EMS administered 10 of albuterol, 0.5 of atrovent, 125 mg of solumedrol, 2 g of Mag PTA. Patient placed shortly after arrival on BIPAP with Dr. Eudelia Bunch at bedside.

## 2023-07-25 NOTE — Hospital Course (Addendum)
Admitted 07/25/2023  Allergies: Ketoconazole Pertinent Hx: HFrEF (LV EF 30-35%), T2DM, HLD, HTN, COPD   57 y.o. female p/w respiratory distress  *COPD exacerbation: On BiPAP, still very wheezy but improving. Getting azithromycin, s/p solumedrol with prednisone to start tomorrow, duonebs. Will titrate off BiPAP as able. Not on O2 at home.   *HFrEF (LV EF <25%): Mild hypervolemia on exam, BNP mildly elevated. No diuresis as OP. Giving IV lasix x1, will restart remainder of GDMT. Echo pending. Cardiology consulted.   *Troponinemia: Very mild, 29. Not having chest pain. EKG stable from prior. Repeat trop pending.  Consults: Cardiology  Meds: azithromycin, lasix, SSI, duoneb, prednisone, *  VTE ppx: Heparin  IVF: N/A  Diet: NPO

## 2023-07-26 ENCOUNTER — Other Ambulatory Visit (INDEPENDENT_AMBULATORY_CARE_PROVIDER_SITE_OTHER): Payer: Self-pay | Admitting: Primary Care

## 2023-07-26 ENCOUNTER — Inpatient Hospital Stay (HOSPITAL_COMMUNITY): Payer: 59

## 2023-07-26 DIAGNOSIS — F17210 Nicotine dependence, cigarettes, uncomplicated: Secondary | ICD-10-CM | POA: Diagnosis not present

## 2023-07-26 DIAGNOSIS — I5042 Chronic combined systolic (congestive) and diastolic (congestive) heart failure: Secondary | ICD-10-CM | POA: Diagnosis not present

## 2023-07-26 DIAGNOSIS — J441 Chronic obstructive pulmonary disease with (acute) exacerbation: Secondary | ICD-10-CM | POA: Diagnosis not present

## 2023-07-26 DIAGNOSIS — I5022 Chronic systolic (congestive) heart failure: Secondary | ICD-10-CM | POA: Diagnosis not present

## 2023-07-26 LAB — BASIC METABOLIC PANEL
Anion gap: 6 (ref 5–15)
BUN: 17 mg/dL (ref 6–20)
CO2: 26 mmol/L (ref 22–32)
Calcium: 8.3 mg/dL — ABNORMAL LOW (ref 8.9–10.3)
Chloride: 108 mmol/L (ref 98–111)
Creatinine, Ser: 1.08 mg/dL — ABNORMAL HIGH (ref 0.44–1.00)
GFR, Estimated: 59 mL/min — ABNORMAL LOW (ref >60)
Glucose, Bld: 109 mg/dL — ABNORMAL HIGH (ref 70–99)
Potassium: 4.1 mmol/L (ref 3.5–5.1)
Sodium: 140 mmol/L (ref 135–145)

## 2023-07-26 LAB — HEMOGLOBIN A1C
Hgb A1c MFr Bld: 6.4 % — ABNORMAL HIGH (ref 4.8–5.6)
Mean Plasma Glucose: 136.98 mg/dL

## 2023-07-26 LAB — ECHOCARDIOGRAM COMPLETE
AR max vel: 1.76 cm2
AV Area VTI: 2 cm2
AV Area mean vel: 1.8 cm2
AV Mean grad: 10 mm[Hg]
AV Peak grad: 18.7 mm[Hg]
Ao pk vel: 2.16 m/s
Area-P 1/2: 2.46 cm2
Height: 60 in
MV M vel: 5.6 m/s
MV Peak grad: 125.4 mm[Hg]
P 1/2 time: 311 ms
Radius: 0.9 cm
S' Lateral: 4.5 cm
Weight: 2747.81 [oz_av]

## 2023-07-26 LAB — LIPID PANEL
Cholesterol: 173 mg/dL (ref 0–200)
HDL: 70 mg/dL (ref >40)
LDL Cholesterol: 94 mg/dL (ref 0–99)
Total CHOL/HDL Ratio: 2.5 {ratio}
Triglycerides: 44 mg/dL (ref <150)
VLDL: 9 mg/dL (ref 0–40)

## 2023-07-26 LAB — CBG MONITORING, ED
Glucose-Capillary: 143 mg/dL — ABNORMAL HIGH (ref 70–99)
Glucose-Capillary: 126 mg/dL — ABNORMAL HIGH (ref 70–99)

## 2023-07-26 LAB — GLUCOSE, CAPILLARY: Glucose-Capillary: 164 mg/dL — ABNORMAL HIGH (ref 70–99)

## 2023-07-26 MED ORDER — COVID-19 MRNA VAC-TRIS(PFIZER) 30 MCG/0.3ML IM SUSY
0.3000 mL | PREFILLED_SYRINGE | INTRAMUSCULAR | Status: DC | PRN
  Filled 2023-07-26: qty 0.3

## 2023-07-26 MED ORDER — GUAIFENESIN-DM 100-10 MG/5ML PO SYRP
5.0000 mL | ORAL_SOLUTION | ORAL | Status: DC | PRN
  Administered 2023-07-26 – 2023-07-27 (×3): 5 mL via ORAL
  Filled 2023-07-26 (×3): qty 5

## 2023-07-26 MED ORDER — ESCITALOPRAM OXALATE 10 MG PO TABS
20.0000 mg | ORAL_TABLET | Freq: Every day | ORAL | Status: DC
  Administered 2023-07-26 – 2023-07-27 (×2): 20 mg via ORAL
  Filled 2023-07-26 (×2): qty 2

## 2023-07-26 MED ORDER — LOSARTAN POTASSIUM 25 MG PO TABS
25.0000 mg | ORAL_TABLET | Freq: Every day | ORAL | Status: DC
  Administered 2023-07-26 – 2023-07-27 (×2): 25 mg via ORAL
  Filled 2023-07-26 (×2): qty 1

## 2023-07-26 MED ORDER — INFLUENZA VIRUS VACC SPLIT PF (FLUZONE) 0.5 ML IM SUSY
0.5000 mL | PREFILLED_SYRINGE | INTRAMUSCULAR | Status: AC | PRN
  Administered 2023-07-26: 0.5 mL via INTRAMUSCULAR
  Filled 2023-07-26: qty 0.5

## 2023-07-26 MED ORDER — MONTELUKAST SODIUM 10 MG PO TABS
10.0000 mg | ORAL_TABLET | Freq: Every day | ORAL | Status: DC
  Administered 2023-07-26: 10 mg via ORAL
  Filled 2023-07-26 (×2): qty 1

## 2023-07-26 MED ORDER — PNEUMOCOCCAL 20-VAL CONJ VACC 0.5 ML IM SUSY
0.5000 mL | PREFILLED_SYRINGE | INTRAMUSCULAR | Status: DC
  Filled 2023-07-26 (×2): qty 0.5

## 2023-07-26 NOTE — Evaluation (Signed)
Occupational Therapy Evaluation Patient Details Name: Stacey Lin MRN: [id number] DOB: 29-Apr-1966 Today's Date: 07/26/2023   History of Present Illness Pt 57 yo presenting to [facility] ED with sudden onset shortness of breath. Pt recently in ED 9/27 for COPD exacerbation and discharged with prednisone course and albuterol inhaler. PMH: HFrEF LV, COPD, HLD, HTN, Gout, Depression, GERD, Anxiety, Diabetes.   Clinical Impression   Pt admitted for the above diagnosis and has the deficits listed below. Pt would benefit from cont OT to increase independence and efficiency with all adls prior to going home. Pt states she has an aide at home and her brother to assist but is falling more than one time per week.  Feel this d/c situation may not be safe at this time. Pt is overall min to mod assist with all adls. Need to confirm how often aid is there.  Will continue to see with focus on energy conservation and activity tolerance during adls.        If plan is discharge home, recommend the following: A little help with walking and/or transfers;A little help with bathing/dressing/bathroom;Assist for transportation;Assistance with cooking/housework    Functional Status Assessment  Patient has had a recent decline in their functional status and demonstrates the ability to make significant improvements in function in a reasonable and predictable amount of time.  Equipment Recommendations  None recommended by OT    Recommendations for Other Services       Precautions / Restrictions Precautions Precautions: Fall Restrictions Weight Bearing Restrictions: No      Mobility Bed Mobility Overal bed mobility: Needs Assistance Bed Mobility: Supine to Sit, Sit to Supine     Supine to sit: Min assist Sit to supine: Min assist   General bed mobility comments: assist to get trunk into full sitting position and to get legs up onto the bed to go to supine.    Transfers Overall transfer level: Needs  assistance Equipment used: Rolling walker (2 wheels) Transfers: Sit to/from Stand Sit to Stand: Min assist           General transfer comment: cues for hand placement and to stand straight.      Balance Overall balance assessment: Needs assistance Sitting-balance support: Feet supported Sitting balance-Leahy Scale: Good     Standing balance support: Bilateral upper extremity supported, Reliant on assistive device for balance, During functional activity Standing balance-Leahy Scale: Poor Standing balance comment: Pt needed walker to stand safely. Pt uses walker and cane at home                           ADL either performed or assessed with clinical judgement   ADL Overall ADL's : Needs assistance/impaired Eating/Feeding: Set up;Sitting   Grooming: Set up;Sitting   Upper Body Bathing: Set up;Sitting   Lower Body Bathing: Moderate assistance;Sit to/from stand;Cueing for compensatory techniques   Upper Body Dressing : Minimal assistance;Sitting   Lower Body Dressing: Moderate assistance;Sit to/from stand;Cueing for compensatory techniques   Toilet Transfer: Minimal assistance;Stand-pivot;BSC/3in1   Toileting- Clothing Manipulation and Hygiene: Moderate assistance;Sit to/from stand;Cueing for compensatory techniques Toileting - Clothing Manipulation Details (indicate cue type and reason): Pt stood while therapist assisted with cleaning     Functional mobility during ADLs: Minimal assistance;Rolling walker (2 wheels) General ADL Comments: Pt fatigued quickly during adls. Pt just finished PT so not motivated to get up and move much.  Pt has aid at home (she says live in aide)that assists with  all adls. Pt does state that she falls multiple times a week.     Vision Baseline Vision/History: 0 No visual deficits Ability to See in Adequate Light: 0 Adequate Patient Visual Report: No change from baseline Vision Assessment?: No apparent visual deficits      Perception Perception: Within Functional Limits       Praxis Praxis: WFL       Pertinent Vitals/Pain Pain Assessment Pain Assessment: Faces Faces Pain Scale: Hurts even more Pain Location: stomach Pain Descriptors / Indicators: Aching Pain Intervention(s): Limited activity within patient's tolerance, Monitored during session, Repositioned     Extremity/Trunk Assessment Upper Extremity Assessment Upper Extremity Assessment: Generalized weakness   Lower Extremity Assessment Lower Extremity Assessment: Defer to PT evaluation   Cervical / Trunk Assessment Cervical / Trunk Assessment: Normal   Communication Communication Communication: No apparent difficulties Cueing Techniques: Verbal cues;Tactile cues;Visual cues   Cognition Arousal: Alert Behavior During Therapy: WFL for tasks assessed/performed Overall Cognitive Status: Within Functional Limits for tasks assessed                                 General Comments: Pt was oriented and followed all commands. Pt had decreased attention span and guessing that is baseline for this patient.     General Comments  Pt limited by SOB and fatigue. Pt has caregiver at home but reports many falls.    Exercises     Shoulder Instructions      Home Living Family/patient expects to be discharged to:: Private residence Living Arrangements: Other relatives Available Help at Discharge: Personal care attendant;Available 24 hours/day Type of Home: House Home Access: Stairs to enter [employer] of Steps: 2-3 Entrance Stairs-Rails: Right Home Layout: One level     Bathroom Shower/Tub: Chief Strategy Officer: Standard     Home Equipment: Agricultural consultant (2 wheels);Cane - single point;Toilet riser;Shower seat;Tub bench          Prior Functioning/Environment Prior Level of Function : Needs assist       Physical Assist : ADLs (physical)   ADLs (physical):  Bathing;Dressing;Toileting;IADLs Mobility Comments: Pt ambulates with cane or walker using the cane the most. Pt falls weekly ADLs Comments: Aide assists with ADL's, IADL's, finances and medication        OT Problem List: Decreased strength;Decreased activity tolerance;Impaired balance (sitting and/or standing);Cardiopulmonary status limiting activity;Decreased knowledge of precautions;Pain      OT Treatment/Interventions: Self-care/ADL training;Energy conservation;Therapeutic activities;Balance training    OT Goals(Current goals can be found in the care plan section) Acute Rehab OT Goals Patient Stated Goal: to be able to breathe easier OT Goal Formulation: With patient Time For Goal Achievement: 08/09/23 Potential to Achieve Goals: Fair ADL Goals Pt Will Perform Grooming: with supervision;sitting Pt Will Transfer to Toilet: with contact guard assist;bedside commode Pt Will Perform Toileting - Clothing Manipulation and hygiene: with contact guard assist;sit to/from stand Pt Will Perform Tub/Shower Transfer: Tub transfer;with min assist;ambulating;shower seat;rolling walker Additional ADL Goal #1: Pt will state 3 things she can do at home during adls to save energy for the day without cues.  OT Frequency: Min 1X/week    Co-evaluation              AM-PAC OT "6 Clicks" Daily Activity     Outcome Measure Help from another person eating meals?: A Little Help from another person taking care of personal grooming?: A Little Help from another  person toileting, which includes using toliet, bedpan, or urinal?: A Lot Help from another person bathing (including washing, rinsing, drying)?: A Lot Help from another person to put on and taking off regular upper body clothing?: A Little Help from another person to put on and taking off regular lower body clothing?: A Lot 6 Click Score: 15   End of Session Equipment Utilized During Treatment: Rolling walker (2 wheels) Nurse Communication:  Mobility status  Activity Tolerance: Patient limited by fatigue Patient left: in bed;with call bell/phone within reach  OT Visit Diagnosis: Unsteadiness on feet (R26.81)                Time: 1049-1110 OT Time Calculation (min): 21 min Charges:  OT General Charges $OT Visit: 1 Visit OT Evaluation $OT Eval Moderate Complexity: 1 Mod  Hope Budds 07/26/2023, 11:28 AM

## 2023-07-26 NOTE — ED Notes (Signed)
ED TO INPATIENT HANDOFF REPORT  ED Nurse Name and Phone #: Lenell Antu Name/Age/Gender Stacey Lin 57 y.o. female Room/Bed: 008C/008C  Code Status   Code Status: Full Code  Home/SNF/Other Home Patient oriented to: self, place, time, and situation Is this baseline? Yes   Triage Complete: Triage complete  Chief Complaint COPD exacerbation (HCC) [J44.1]  Triage Note Patient arrives in respiratory distress via [facility] on nebulizer treatment, tachypneic, slower to respond initially only to painful stimuli. EMS administered 10 of albuterol, 0.5 of atrovent, 125 mg of solumedrol, 2 g of Mag PTA. Patient placed shortly after arrival on BIPAP with Dr. Eudelia Bunch at bedside.    Allergies Allergies  Allergen Reactions   Ketoconazole Rash    Level of Care/Admitting Diagnosis ED Disposition     ED Disposition  Admit   Condition  --   Comment  Hospital Area: [facility] [[id number]]  Level of Care: Progressive [102]  Admit to Progressive based on following criteria: RESPIRATORY PROBLEMS hypoxemic/hypercapnic respiratory failure that is responsive to NIPPV (BiPAP) or High Flow Nasal Cannula (6-80 lpm). Frequent assessment/intervention, no > Q2 hrs < Q4 hrs, to maintain oxygenation and pulmonary hygiene.  May admit patient to [facility] or [facility] if equivalent level of care is available:: No  Covid Evaluation: Confirmed COVID Negative  Diagnosis: COPD exacerbation [facility]) [[id number]]  Admitting Physician: Inez Catalina [phone]  Attending Physician: Inez Catalina [phone]  Certification:: I certify this patient will need inpatient services for at least 2 midnights  Expected Medical Readiness: 07/28/2023          B Medical/Surgery History Past Medical History:  Diagnosis Date   Anemia    Arrhythmia    Asthma    CHF (congestive heart failure) (HCC)    1990s   Chronic headaches    COPD (chronic obstructive pulmonary disease) (HCC)    ??   Hypercholesteremia     Hypertension    Past Surgical History:  Procedure Laterality Date   LEFT AND RIGHT HEART CATHETERIZATION WITH CORONARY ANGIOGRAM N/A 06/06/2014   Procedure: LEFT AND RIGHT HEART CATHETERIZATION WITH CORONARY ANGIOGRAM;  Surgeon: Dolores Patty, MD;  Location: [facility] CATH LAB;  Service: Cardiovascular;  Laterality: N/A;   MULTIPLE EXTRACTIONS WITH ALVEOLOPLASTY Bilateral 07/24/2015   Procedure: MULTIPLE EXTRACTIONS WITH ALVEOLOPLASTY,  BILATERAL TORI ;  Surgeon: Ocie Doyne, DDS;  Location: [facility] OR;  Service: Oral Surgery;  Laterality: Bilateral;   RIGHT/LEFT HEART CATH AND CORONARY ANGIOGRAPHY N/A 04/11/2022   Procedure: RIGHT/LEFT HEART CATH AND CORONARY ANGIOGRAPHY;  Surgeon: Corky Crafts, MD;  Location: [facility] INVASIVE CV LAB;  Service: Cardiovascular;  Laterality: N/A;   TUBAL LIGATION  1986     A IV Location/Drains/Wounds Patient Lines/Drains/Airways Status     Active Line/Drains/Airways     Name Placement date Placement time Site Days   Peripheral IV 07/25/23 22 G Anterior;Right Forearm 07/25/23  0652  Forearm  1   External Urinary Catheter 07/25/23  1150  --  1            Intake/Output Last 24 hours  Intake/Output Summary (Last 24 hours) at 07/26/2023 1202 Last data filed at 07/25/2023 2034 Gross per 24 hour  Intake --  Output 2200 ml  Net -2200 ml    Labs/Imaging Results for orders placed or performed during the hospital encounter of 07/25/23 (from the past 48 hour(s))  Brain natriuretic peptide     Status: Abnormal   Collection Time: 07/25/23  7:03 AM  Result Value Ref Range   B Natriuretic Peptide 139.2 (H) 0.0 - 100.0 pg/mL    Comment: Performed at [facility] Lab, [address] [place], [place] [address]  Comprehensive metabolic panel     Status: Abnormal   Collection Time: 07/25/23  7:10 AM  Result Value Ref Range   Sodium 135 135 - 145 mmol/L   Potassium 4.1 3.5 - 5.1 mmol/L   Chloride 104 98 - 111 mmol/L   CO2 20 (L) 22 - 32 mmol/L   Glucose, Bld  128 (H) 70 - 99 mg/dL    Comment: Glucose reference range applies only to samples taken after fasting for at least 8 hours.   BUN 14 6 - 20 mg/dL   Creatinine, Ser 6.57 (H) 0.44 - 1.00 mg/dL   Calcium 8.4 (L) 8.9 - 10.3 mg/dL   Total Protein 5.8 (L) 6.5 - 8.1 g/dL   Albumin 3.0 (L) 3.5 - 5.0 g/dL   AST 26 15 - 41 U/L   ALT 20 0 - 44 U/L   Alkaline Phosphatase 91 38 - 126 U/L   Total Bilirubin 0.6 0.3 - 1.2 mg/dL   GFR, Estimated 56 (L) >60 mL/min    Comment: (NOTE) Calculated using the CKD-EPI Creatinine Equation (2021)    Anion gap 11 5 - 15    Comment: Performed at [facility] Lab, [address], [place], [place] [address]  CBC     Status: Abnormal   Collection Time: 07/25/23  7:10 AM  Result Value Ref Range   WBC 15.9 (H) 4.0 - 10.5 K/uL   RBC 4.18 3.87 - 5.11 MIL/uL   Hemoglobin 12.7 12.0 - 15.0 g/dL   HCT 29.5 28.4 - 13.2 %   MCV 97.1 80.0 - 100.0 fL   MCH 30.4 26.0 - 34.0 pg   MCHC 31.3 30.0 - 36.0 g/dL   RDW 44.0 10.2 - 72.5 %   Platelets 275 150 - 400 K/uL   nRBC 0.0 0.0 - 0.2 %    Comment: Performed at [facility] Lab, [address], [place], [place] [address]  Troponin I (High Sensitivity)     Status: Abnormal   Collection Time: 07/25/23  7:10 AM  Result Value Ref Range   Troponin I (High Sensitivity) 29 (H) <18 ng/L    Comment: (NOTE) Elevated high sensitivity troponin I (hsTnI) values and significant  changes across serial measurements may suggest ACS but many other  chronic and acute conditions are known to elevate hsTnI results.  Refer to the "Links" section for chest pain algorithms and additional  guidance. Performed at [facility], [address], [place], [place] [address]   I-Stat venous blood gas, ED     Status: Abnormal   Collection Time: 07/25/23  7:15 AM  Result Value Ref Range   pH, Ven 7.476 (H) 7.25 - 7.43   pCO2, Ven 30.3 (L) 44 - 60 mmHg   pO2, Ven 101 (H) 32 - 45 mmHg   Bicarbonate 22.4 20.0 - 28.0 mmol/L   TCO2 23 22 - 32  mmol/L   O2 Saturation 98 %   Acid-Base Excess 0.0 0.0 - 2.0 mmol/L   Sodium 135 135 - 145 mmol/L   Potassium 4.9 3.5 - 5.1 mmol/L   Calcium, Ion 1.04 (L) 1.15 - 1.40 mmol/L   HCT 38.0 36.0 - 46.0 %   Hemoglobin 12.9 12.0 - 15.0 g/dL   Sample type VENOUS   Resp panel by RT-PCR (  RSV, Flu A&B, Covid) Anterior Nasal Swab     Status: None   Collection Time: 07/25/23  7:18 AM   Specimen: Anterior Nasal Swab  Result Value Ref Range   SARS Coronavirus 2 by RT PCR NEGATIVE NEGATIVE   Influenza A by PCR NEGATIVE NEGATIVE   Influenza B by PCR NEGATIVE NEGATIVE    Comment: (NOTE) The Xpert Xpress SARS-CoV-2/FLU/RSV plus assay is intended as an aid in the diagnosis of influenza from Nasopharyngeal swab specimens and should not be used as a sole basis for treatment. Nasal washings and aspirates are unacceptable for Xpert Xpress SARS-CoV-2/FLU/RSV testing.  Fact Sheet for Patients: BloggerCourse.com  Fact Sheet for Healthcare Providers: SeriousBroker.it  This test is not yet approved or cleared by the [place] FDA and has been authorized for detection and/or diagnosis of SARS-CoV-2 by FDA under an Emergency Use Authorization (EUA). This EUA will remain in effect (meaning this test can be used) for the duration of the COVID-19 declaration under Section 564(b)(1) of the Act, 21 U.S.C. section 360bbb-3(b)(1), unless the authorization is terminated or revoked.     Resp Syncytial Virus by PCR NEGATIVE NEGATIVE    Comment: (NOTE) Fact Sheet for Patients: BloggerCourse.com  Fact Sheet for Healthcare Providers: SeriousBroker.it  This test is not yet approved or cleared by the [place] FDA and has been authorized for detection and/or diagnosis of SARS-CoV-2 by FDA under an Emergency Use Authorization (EUA). This EUA will remain in effect (meaning this test can be used) for the  duration of the COVID-19 declaration under Section 564(b)(1) of the Act, 21 U.S.C. section 360bbb-3(b)(1), unless the authorization is terminated or revoked.  Performed at [facility] Lab, [address], [place], [place] [address]   CBG monitoring, ED     Status: Abnormal   Collection Time: 07/25/23 11:12 AM  Result Value Ref Range   Glucose-Capillary 197 (H) 70 - 99 mg/dL    Comment: Glucose reference range applies only to samples taken after fasting for at least 8 hours.  Troponin I (High Sensitivity)     Status: None   Collection Time: 07/25/23 11:15 AM  Result Value Ref Range   Troponin I (High Sensitivity) 11 <18 ng/L    Comment: (NOTE) Elevated high sensitivity troponin I (hsTnI) values and significant  changes across serial measurements may suggest ACS but many other  chronic and acute conditions are known to elevate hsTnI results.  Refer to the "Links" section for chest pain algorithms and additional  guidance. Performed at [facility] Lab, [address], [place], [place] [address]   HIV Antibody (routine testing w rflx)     Status: None   Collection Time: 07/25/23 11:15 AM  Result Value Ref Range   HIV Screen 4th Generation wRfx Non Reactive Non Reactive    Comment: Performed at [facility] Lab, [address] [place], [place] [address]  CBG monitoring, ED     Status: Abnormal   Collection Time: 07/25/23 11:41 AM  Result Value Ref Range   Glucose-Capillary 195 (H) 70 - 99 mg/dL    Comment: Glucose reference range applies only to samples taken after fasting for at least 8 hours.  CBG monitoring, ED     Status: Abnormal   Collection Time: 07/25/23  5:02 PM  Result Value Ref Range   Glucose-Capillary 414 (H) 70 - 99 mg/dL    Comment: Glucose reference range applies only to samples taken after fasting for at least 8 hours.  CBG  monitoring, ED     Status: Abnormal   Collection Time: 07/25/23  6:02 PM  Result Value Ref Range   Glucose-Capillary 356 (H) 70 -  99 mg/dL    Comment: Glucose reference range applies only to samples taken after fasting for at least 8 hours.  CBG monitoring, ED     Status: Abnormal   Collection Time: 07/25/23  6:40 PM  Result Value Ref Range   Glucose-Capillary 310 (H) 70 - 99 mg/dL    Comment: Glucose reference range applies only to samples taken after fasting for at least 8 hours.  CBG monitoring, ED     Status: Abnormal   Collection Time: 07/25/23  9:33 PM  Result Value Ref Range   Glucose-Capillary 282 (H) 70 - 99 mg/dL    Comment: Glucose reference range applies only to samples taken after fasting for at least 8 hours.  Basic metabolic panel     Status: Abnormal   Collection Time: 07/26/23  2:54 AM  Result Value Ref Range   Sodium 140 135 - 145 mmol/L   Potassium 4.1 3.5 - 5.1 mmol/L   Chloride 108 98 - 111 mmol/L   CO2 26 22 - 32 mmol/L   Glucose, Bld 109 (H) 70 - 99 mg/dL    Comment: Glucose reference range applies only to samples taken after fasting for at least 8 hours.   BUN 17 6 - 20 mg/dL   Creatinine, Ser 4.09 (H) 0.44 - 1.00 mg/dL   Calcium 8.3 (L) 8.9 - 10.3 mg/dL   GFR, Estimated 59 (L) >60 mL/min    Comment: (NOTE) Calculated using the CKD-EPI Creatinine Equation (2021)    Anion gap 6 5 - 15    Comment: Performed at [facility] Lab, [address] [place], [place] [address]  Hemoglobin A1c     Status: Abnormal   Collection Time: 07/26/23  2:54 AM  Result Value Ref Range   Hgb A1c MFr Bld 6.4 (H) 4.8 - 5.6 %    Comment: (NOTE) Pre diabetes:          5.7%-6.4%  Diabetes:              >6.4%  Glycemic control for   <7.0% adults with diabetes    Mean Plasma Glucose 136.98 mg/dL    Comment: Performed at [facility], [address] [place], [place] [address]  Lipid panel     Status: None   Collection Time: 07/26/23  2:54 AM  Result Value Ref Range   Cholesterol 173 0 - 200 mg/dL   Triglycerides 44 <562 mg/dL   HDL 70 >13 mg/dL   Total CHOL/HDL Ratio 2.5 RATIO   VLDL 9 0 -  40 mg/dL   LDL Cholesterol 94 0 - 99 mg/dL    Comment:        Total Cholesterol/HDL:CHD Risk Coronary Heart Disease Risk Table                     Men   Women  1/2 Average Risk   3.4   3.3  Average Risk       5.0   4.4  2 X Average Risk   9.6   7.1  3 X Average Risk  23.4   11.0        Use the calculated Patient Ratio above and the CHD Risk Table to determine the patient's CHD Risk.        ATP III CLASSIFICATION (LDL):  <  100     mg/dL   Optimal  846-962  mg/dL   Near or Above                    Optimal  130-159  mg/dL   Borderline  952-841  mg/dL   High  >324     mg/dL   Very High Performed at [facility] Lab, [address], [place], [place] [address]   CBG monitoring, ED     Status: Abnormal   Collection Time: 07/26/23  7:48 AM  Result Value Ref Range   Glucose-Capillary 143 (H) 70 - 99 mg/dL    Comment: Glucose reference range applies only to samples taken after fasting for at least 8 hours.   DG Chest Port 1 View  Result Date: 07/25/2023 CLINICAL DATA:  Shortness of breath EXAM: PORTABLE CHEST 1 VIEW COMPARISON:  07/21/2023 FINDINGS: Chronic cardiomegaly. Indistinct density at the lung bases not seen on prior. No Kerley lines, effusion, or pneumothorax. Artifact from EKG leads. No acute osseous finding IMPRESSION: Atelectasis or early infiltrate in the lower lungs. Chronic cardiomegaly. Electronically Signed   By: Tiburcio Pea M.D.   On: 07/25/2023 07:34    Pending Labs Unresulted Labs (From admission, onward)    None       Vitals/Pain Today's Vitals   07/26/23 0615 07/26/23 0730 07/26/23 0904 07/26/23 0920  BP:    134/84  Pulse:   67 (!) 56  Resp:    19  Temp: 98.2 F (36.8 C)   98.2 F (36.8 C)  TempSrc: Oral   Oral  SpO2:   94% 98%  Weight:      Height:      PainSc:  0-No pain      Isolation Precautions No active isolations  Medications Medications  nitroGLYCERIN (NITROGLYN) 2 % ointment 0.5 inch (0 inches Topical Hold 07/25/23 0653)   heparin injection 5,000 Units (5,000 Units Subcutaneous Given 07/26/23 0509)  ipratropium-albuterol (DUONEB) 0.5-2.5 (3) MG/3ML nebulizer solution 3 mL (3 mLs Nebulization Given 07/26/23 0923)  azithromycin (ZITHROMAX) 500 mg in sodium chloride 0.9 % 250 mL IVPB (0 mg Intravenous Stopped 07/26/23 1202)  predniSONE (DELTASONE) tablet 40 mg (40 mg Oral Given 07/26/23 0928)  insulin aspart (novoLOG) injection 0-15 Units (2 Units Subcutaneous Given 07/26/23 0936)  carvedilol (COREG) tablet 3.125 mg (3.125 mg Oral Given 07/26/23 0929)  dapagliflozin propanediol (FARXIGA) tablet 10 mg (10 mg Oral Given 07/26/23 0928)  montelukast (SINGULAIR) tablet 10 mg (has no administration in time range)  escitalopram (LEXAPRO) tablet 20 mg (20 mg Oral Given 07/26/23 0928)  albuterol (PROVENTIL) (2.5 MG/3ML) 0.083% nebulizer solution (15 mg/hr Nebulization Given 07/25/23 0808)  furosemide (LASIX) injection 40 mg (40 mg Intravenous Given 07/25/23 1153)    Mobility walks with device     Focused Assessments Pulmonary Assessment Handoff:  Lung sounds: clear.  98% RA   R Recommendations: See Admitting Provider Note  Report given to:   Additional Notes:

## 2023-07-26 NOTE — Plan of Care (Signed)

## 2023-07-26 NOTE — Evaluation (Signed)
Physical Therapy Evaluation Patient Details Name: Stacey Lin MRN: [id number] DOB: 03/22/1966 Today's Date: 07/26/2023  History of Present Illness  Pt 57 yo presenting to [facility] ED with sudden onset shortness of breath. Pt recently in ED 9/27 for COPD exacerbation and discharged with prednisone course and albuterol inhaler. PMH: HFrEF LV, COPD, HLD, HTN, Gout, Depression, GERD, Anxiety, Diabetes.  Clinical Impression  Pt is presenting close to baseline level of functioning. Prior to hospitalization pt had 24/7 aide to assist with IADL"s. Pt was Mod I with RW in the home. Currently pt is CGA for sit to stand and short distance gait. Pt is Mod I for bed mobility with increased time. Due to pt current level of function, home set up and available assistance at home no recommended skilled physical therapy services on discharge at this time. Will continue to follow in acute care hospital setting in order to decrease pt risk for falls, injury and re-hospitalization.         If plan is discharge home, recommend the following: A little help with walking and/or transfers;Assist for transportation;Help with stairs or ramp for entrance;Assistance with cooking/housework     Equipment Recommendations None recommended by PT     Functional Status Assessment Patient has had a recent decline in their functional status and demonstrates the ability to make significant improvements in function in a reasonable and predictable amount of time.     Precautions / Restrictions Precautions Precautions: Fall Restrictions Weight Bearing Restrictions: No      Mobility  Bed Mobility Overal bed mobility: Modified Independent Bed Mobility: Supine to Sit, Sit to Supine     Supine to sit: Modified independent (Device/Increase time) Sit to supine: Modified independent (Device/Increase time)   General bed mobility comments: Increased time    Transfers Overall transfer level: Needs assistance Equipment used: Rolling  walker (2 wheels) Transfers: Sit to/from Stand Sit to Stand: Contact guard assist           General transfer comment: cues for hand placement for safety    Ambulation/Gait Ambulation/Gait assistance: Contact guard assist Gait Distance (Feet): 8 Feet Assistive device: Rolling walker (2 wheels) Gait Pattern/deviations: Step-through pattern, Decreased step length - right, Decreased step length - left Gait velocity: decreased Gait velocity interpretation: <1.31 ft/sec, indicative of household ambulator   General Gait Details: partial step through, very slow O2 sats 95% on RA  Stairs Stairs:  (Pt demonstrates adequate strength with sit to stand and gait to navigate stairs per home set up with minimal assistance or less.)              Balance Overall balance assessment: Needs assistance Sitting-balance support: Feet supported Sitting balance-Leahy Scale: Good     Standing balance support: Bilateral upper extremity supported, During functional activity Standing balance-Leahy Scale: Fair Standing balance comment: no overt LOB           Pertinent Vitals/Pain Pain Assessment Pain Assessment: 0-10 Pain Score: 8  Pain Location: stomach Pain Descriptors / Indicators: Aching Pain Intervention(s): Limited activity within patient's tolerance, Monitored during session, Repositioned    Home Living Family/patient expects to be discharged to:: Private residence Living Arrangements: Other relatives Available Help at Discharge: Personal care attendant;Available 24 hours/day Type of Home: House Home Access: Stairs to enter Entrance Stairs-Rails: Right Entrance Stairs-Number of Steps: 2-3   Home Layout: One level Home Equipment: Agricultural consultant (2 wheels);Cane - single point;Toilet riser;Shower seat;Tub bench      Prior Function Prior Level of Function : Needs  assist       Physical Assist : ADLs (physical)   ADLs (physical): Bathing;Dressing;Toileting;IADLs Mobility  Comments: Pt ambulates with cane or walker using the cane the most. Pt reports 4 falls in 6 months. All falls were outside. ADLs Comments: Aide assists with ADL's, IADL's, finances and medication     Extremity/Trunk Assessment   Upper Extremity Assessment Upper Extremity Assessment: Overall WFL for tasks assessed    Lower Extremity Assessment Lower Extremity Assessment: Overall WFL for tasks assessed    Cervical / Trunk Assessment Cervical / Trunk Assessment: Normal  Communication   Communication Communication: No apparent difficulties Cueing Techniques: Verbal cues;Tactile cues;Visual cues  Cognition Arousal: Alert Behavior During Therapy: WFL for tasks assessed/performed Overall Cognitive Status: Within Functional Limits for tasks assessed       General Comments: pt has tangents but overall oriented        General Comments General comments (skin integrity, edema, etc.): Pt semi-self limiting. O2 sats remained 94-96% with good pleth line on room air. Pt reports 4 falls in 6 months and caregiver is unaware. Pt reports she fell outside.        Assessment/Plan    PT Assessment Patient needs continued PT services  PT Problem List Decreased mobility;Decreased balance;Decreased safety awareness       PT Treatment Interventions DME instruction;Therapeutic exercise;Gait training;Balance training;Stair training;Functional mobility training;Therapeutic activities;Patient/family education    PT Goals (Current goals can be found in the Care Plan section)  Acute Rehab PT Goals Patient Stated Goal: return home PT Goal Formulation: With patient Time For Goal Achievement: 08/09/23 Potential to Achieve Goals: Fair    Frequency Min 1X/week        AM-PAC PT "6 Clicks" Mobility  Outcome Measure Help needed turning from your back to your side while in a flat bed without using bedrails?: None Help needed moving from lying on your back to sitting on the side of a flat bed  without using bedrails?: None Help needed moving to and from a bed to a chair (including a wheelchair)?: A Little Help needed standing up from a chair using your arms (e.g., wheelchair or bedside chair)?: A Little Help needed to walk in hospital room?: A Little Help needed climbing 3-5 steps with a railing? : A Little 6 Click Score: 20    End of Session Equipment Utilized During Treatment: Gait belt Activity Tolerance: Patient limited by fatigue Patient left: in bed;with call bell/phone within reach Nurse Communication: Mobility status PT Visit Diagnosis: Other abnormalities of gait and mobility (R26.89)    Time: 1610-9604 PT Time Calculation (min) (ACUTE ONLY): 27 min   Charges:   PT Evaluation $PT Eval Low Complexity: 1 Low PT Treatments $Therapeutic Activity: 8-22 mins PT General Charges $$ ACUTE PT VISIT: 1 Visit         Harrel Carina, DPT, CLT  Acute Rehabilitation Services Office: [phone] (Secure chat preferred)   Claudia Desanctis 07/26/2023, 12:31 PM

## 2023-07-26 NOTE — Progress Notes (Signed)
   07/26/23 2124  BiPAP/CPAP/SIPAP  BiPAP/CPAP/SIPAP Pt Type Adult  Reason BIPAP/CPAP not in use Non-compliant (refused)  BiPAP/CPAP /SiPAP Vitals  SpO2 95 %   No machine at bedside

## 2023-07-26 NOTE — Progress Notes (Signed)
   Rounding Note    Patient Name: Stacey Lin Date of Encounter: 07/26/2023  [facility] Cardiologist: None   Subjective   Resting comfortably this AM. No acute events overnight.   Inpatient Medications    Scheduled Meds:  carvedilol  3.125 mg Oral BID WC   dapagliflozin propanediol  10 mg Oral Daily   escitalopram  20 mg Oral Daily   heparin  5,000 Units Subcutaneous Q8H   insulin aspart  0-15 Units Subcutaneous TID WC   ipratropium-albuterol  3 mL Nebulization Q4H   losartan  25 mg Oral Daily   montelukast  10 mg Oral QHS   nitroGLYCERIN  0.5 inch Topical Once   predniSONE  40 mg Oral Q breakfast   Continuous Infusions:  azithromycin Stopped (07/26/23 1202)   PRN Meds:    Vital Signs    Vitals:   07/26/23 1130 07/26/23 1145 07/26/23 1200 07/26/23 1243  BP: (!) 141/88 (!) 134/100 (!) 149/88 135/77  Pulse: 67 61 68 68  Resp:    16  Temp:      TempSrc:      SpO2: 97% 94% 100% 97%  Weight:      Height:        Intake/Output Summary (Last 24 hours) at 07/26/2023 1318 Last data filed at 07/25/2023 2034 Gross per 24 hour  Intake --  Output 1700 ml  Net -1700 ml      07/25/2023    6:51 AM 07/21/2023   11:12 PM 04/29/2023    7:36 AM  Last 3 Weights  Weight (lbs) 251 lb 5.2 oz 251 lb 5.2 oz 250 lb  Weight (kg) 114 kg 114 kg 113.399 kg      Telemetry    sinus - Personally Reviewed  Physical Exam   GEN: No acute distress.   Neck: No JVD Cardiac: RRR, no rubs, or gallops. 2/6 systolic murmur Respiratory: mild diffuse predominantly expiratory wheezing GI: Soft, nontender, non-distended  MS: No edema; No deformity. Neuro:  Nonfocal  Psych: Normal affect   New pertinent results (labs, ECG, imaging, cardiac studies)    Echo pending  Patient Profile     57 y.o. female with a hx of CHF, HLD, HTN, mitral valve regurgitation, polysubstance abuse, asthma, COPD who is seen for possible acute on chronic diastolic heart failure.   Assessment &  Plan    Shortness of breath COPD exacerbation Tobacco use -exam, workup most suggestive of pulmonary etiology of symptoms rather than cardiac -management per primary team  Chronic systolic and diastolic heart failure -appears euvolemic on exam. Last echo with EF 30-35%, severe biatrial enlargement, mod-severe TR, mod-severe MR, mod-severe AR -repeat echo pending -continue carvedilol, dapagliflozin. Losartan, spironolactone, lasix held overnight, but labs stable and there is BP room, so will restart with losartan first.  Cocaine use -needs cessation    Signed, Stacey Red, MD  07/26/2023, 1:18 PM

## 2023-07-26 NOTE — Progress Notes (Signed)
HD#1 SUBJECTIVE:  Patient Summary: Stacey Lin is a 57 y.o. with a pertinent PMH of CHF, HLD, HTN, mitral valve regurgitation, polysubstance use, asthma, COP, and anemia who presented with shortness of breath and admitted for COPD exacerbation.   Overnight Events: NAEO   Interim History: Patient was evaluated at bedside. Endorsed generalized pain. Shortness of breath and difficulties breathing improving. No other concerns.   OBJECTIVE:  Vital Signs: Vitals:   07/26/23 1243 07/26/23 1300 07/26/23 1315 07/26/23 1444  BP: 135/77 (!) 144/86 129/77   Pulse: 68 65 63   Resp: 16 14 15    Temp:   98.3 F (36.8 C)   TempSrc:   Oral   SpO2: 97% 96% 96%   Weight:    77.9 kg  Height:    5' (1.524 m)   Supplemental O2: Nasal Cannula SpO2: 96 % O2 Flow Rate (L/min): 2 L/min FiO2 (%): 40 %  Filed Weights   07/25/23 0651 07/26/23 1444  Weight: 114 kg 77.9 kg     Intake/Output Summary (Last 24 hours) at 07/26/2023 1514 Last data filed at 07/25/2023 2034 Gross per 24 hour  Intake --  Output 1700 ml  Net -1700 ml   Net IO Since Admission: -2,200 mL [07/26/23 1514]  Physical Exam: Physical Exam Constitutional:      Appearance: Normal appearance.  HENT:     Head: Normocephalic and atraumatic.  Cardiovascular:     Rate and Rhythm: Normal rate and regular rhythm.  Pulmonary:     Breath sounds: Wheezing present.  Abdominal:     General: Abdomen is flat. Bowel sounds are normal.  Skin:    General: Skin is warm.  Neurological:     General: No focal deficit present.     Mental Status: She is alert.    ASSESSMENT/PLAN:  Assessment: Principal Problem:   COPD exacerbation (HCC)   Plan: #COPD exacerbation #Tobacco use disorder #Medication noncompliance Patient was recently evaluated in the ED on 9/27 for COPD exacerbation and was given a 5-day course of prednisone and an albuterol inhaler.  Since discharge, she notes improvement but notes that concurrent smoking and  weather changes led to her exacerbation.  She also notes she has had increased cough with sputum production and respiratory distress over the last week.  Patient also noted that she attempted to call her PCP for medication refills on 9/30, but because she has not been seen for 1 year, the office cannot provide a nebulizer her refills until seen on 08/02/2023.  Chest x-ray demonstrated atelectasis in her lower lungs with chronic cardiomegaly.  Not on any oxygen at home.  Currently has montelukast, Symbicort as an outpatient but does not use the Symbicort more than 3 times per week. Exacerbation likely secondary to medication non-adherence - Continue DuoNebs every 4 hours - Begin azithromycin 500 mg daily for 3 days (today is day 2) - Begin prednisone 40 mg daily for 4 days (today is day 1) - Begin montelukast 10 mg   # HFrEF with LVEF less than 25% Had a right and left heart cath in June 2023 that demonstrated left ventricular ejection fraction less than 25% with mild pulmonary hypertension.  She was discharged with Coreg, Lasix, lispro, losartan, and was scheduled to follow-up with cardiology but was lost to follow-up. BNP elevated at 139.2.  Cardiology consulted and recommended repeat echo.  Patient noted to have not tolerated Entresto in the past due to cough. - Follow-up echo - Resume carvedilol 3.125 mg twice daily  and Farxiga 10 mg daily - Resume losartan 25 mg daily   #Depression #Anxiety Continue home Lexapro 20 mg daily  #Type 2 diabetes mellitus #Diabetic neuropathy A1c today is 6.4.  Outpatient regimen of metformin 500 mg daily.  CBG goal of 140-1 80. - Continue SSI 3 times daily with meals - Continue home gabapentin 300 mg twice daily  #Hypertension Patient received 1 dose of 40 mg IV Lasix.  Daily net of -2.2 L.  BP noted to be in the 130s/70s.   - Resume carvedilol 3.125 mg twice daily and Farxiga 10 mg daily - Resume losartan 25 mg daily   #Elevated troponins Mildly elevated  at 29 and downtrending to 11. Not consistent with ACS and likely in the context of demand ischemia secondary to COPD exacerbation  Chronic Conditions  #Hyperlipidemia No lipid panel since 2019 patient is not currently on outpatient statin therapy.  Lipid panel within normal limits  #History of substance use Drug screen noted to have been positive for cocaine in the past.  Patient endorses recent cocaine use.  Best Practice: Diet: Cardiac diet VTE: heparin injection 5,000 Units Start: 07/25/23 1400 Code: Full AB: Azithromycin  DISPO: Anticipated discharge tomorrow to  TBD  pending  resolution of symptoms .  Signature: Morrie Sheldon, MD Internal Medicine Resident, PGY-1 [facility]  Pager: [phone]  Please contact the on call pager after 5 pm and on weekends at [phone].

## 2023-07-27 ENCOUNTER — Other Ambulatory Visit (HOSPITAL_COMMUNITY): Payer: Self-pay

## 2023-07-27 DIAGNOSIS — F17210 Nicotine dependence, cigarettes, uncomplicated: Secondary | ICD-10-CM | POA: Diagnosis not present

## 2023-07-27 DIAGNOSIS — J441 Chronic obstructive pulmonary disease with (acute) exacerbation: Secondary | ICD-10-CM | POA: Diagnosis not present

## 2023-07-27 LAB — BASIC METABOLIC PANEL
Anion gap: 10 (ref 5–15)
BUN: 17 mg/dL (ref 6–20)
CO2: 24 mmol/L (ref 22–32)
Calcium: 8.4 mg/dL — ABNORMAL LOW (ref 8.9–10.3)
Chloride: 105 mmol/L (ref 98–111)
Creatinine, Ser: 0.96 mg/dL (ref 0.44–1.00)
GFR, Estimated: >60 mL/min (ref >60)
Glucose, Bld: 97 mg/dL (ref 70–99)
Potassium: 3.8 mmol/L (ref 3.5–5.1)
Sodium: 139 mmol/L (ref 135–145)

## 2023-07-27 LAB — GLUCOSE, CAPILLARY
Glucose-Capillary: 121 mg/dL — ABNORMAL HIGH (ref 70–99)
Glucose-Capillary: 199 mg/dL — ABNORMAL HIGH (ref 70–99)

## 2023-07-27 MED ORDER — GUAIFENESIN-DM 100-10 MG/5ML PO SYRP
5.0000 mL | ORAL_SOLUTION | ORAL | 0 refills | Status: DC | PRN
  Filled 2023-07-27: qty 237, 8d supply, fill #0

## 2023-07-27 MED ORDER — PREDNISONE 20 MG PO TABS
40.0000 mg | ORAL_TABLET | Freq: Every day | ORAL | 0 refills | Status: DC
  Filled 2023-07-27: qty 6, 3d supply, fill #0

## 2023-07-27 MED ORDER — IPRATROPIUM-ALBUTEROL 0.5-2.5 (3) MG/3ML IN SOLN: 3.0000 mL | Freq: Two times a day (BID) | RESPIRATORY_TRACT | Status: DC

## 2023-07-27 MED ORDER — IPRATROPIUM-ALBUTEROL 0.5-2.5 (3) MG/3ML IN SOLN
3.0000 mL | Freq: Two times a day (BID) | RESPIRATORY_TRACT | 0 refills | Status: DC
  Filled 2023-07-27: qty 180, 30d supply, fill #0

## 2023-07-27 MED ORDER — ACETAMINOPHEN 325 MG PO TABS
650.0000 mg | ORAL_TABLET | Freq: Once | ORAL | Status: AC
  Administered 2023-07-27: 650 mg via ORAL
  Filled 2023-07-27: qty 2

## 2023-07-27 MED ORDER — GABAPENTIN 300 MG PO CAPS
300.0000 mg | ORAL_CAPSULE | Freq: Two times a day (BID) | ORAL | 0 refills | Status: DC
  Filled 2023-07-27: qty 60, 30d supply, fill #0

## 2023-07-27 MED ORDER — MONTELUKAST SODIUM 10 MG PO TABS
10.0000 mg | ORAL_TABLET | Freq: Every day | ORAL | 0 refills | Status: DC
Start: 2023-07-27 — End: 2023-09-11
  Filled 2023-07-27: qty 30, 30d supply, fill #0

## 2023-07-27 NOTE — Progress Notes (Signed)
Nurse requested Mobility Specialist to perform oxygen saturation test with pt which includes removing pt from oxygen both at rest and while ambulating.  Below are the results from that testing.     Patient Saturations on Room Air at Rest = spO2 93%  Patient Saturations on Room Air while Ambulating = sp02 92% .  Rested and performed pursed lip breathing for 1 minute with sp02 at 95%.  Reported results to nurse.

## 2023-07-27 NOTE — Progress Notes (Signed)
1240 patient ambulated in hall 30 feet on room air sp02 wnl patient sob but 02 remained the same

## 2023-07-27 NOTE — TOC Transition Note (Signed)
Transition of Care [facility]) - CM/SW Discharge Note   Patient Details  Name: Stacey Lin MRN: [id number] Date of Birth: 1965-12-22  [facility]) CM/SW Contact:  Leone Haven, RN Phone Number: 07/27/2023, 2:05 PM   Clinical Narrative:    Patient is for dc today, she has the neb machine at the bedside and she has transportation.       Barriers to Discharge: Continued Medical Work up   Patient Goals and CMS Choice   Choice offered to / list presented to : NA  Discharge Placement                         Discharge Plan and Services Additional resources added to the After Visit Summary for   In-house Referral: NA Discharge Planning Services: CM Consult Post Acute Care Choice: NA          DME Arranged: Nebulizer machine DME Agency: [employer] Date DME Agency Contacted: 07/27/23 Time DME Agency Contacted: 1228 Representative spoke with at DME Agency: Vaughan Basta HH Arranged: NA          Social Determinants of Health (SDOH) Interventions SDOH Screenings   Food Insecurity: No Food Insecurity (07/26/2023)  Housing: Medium Risk (07/26/2023)  Transportation Needs: Unmet Transportation Needs (07/26/2023)  Utilities: Not At Risk (07/26/2023)  Depression (PHQ2-9): High Risk (08/04/2022)  Tobacco Use: Medium Risk (07/25/2023)     Readmission Risk Interventions    04/13/2022   12:21 PM  Readmission Risk Prevention Plan  Transportation Screening Complete  PCP or Specialist Appt within 3-5 Days Complete  HRI or Home Care Consult Complete  Social Work Consult for Recovery Care Planning/Counseling Complete  Palliative Care Screening Not Applicable  Medication Review Oceanographer) Complete

## 2023-07-27 NOTE — Progress Notes (Addendum)
Mobility Specialist Progress Note:   07/27/23 1245  Mobility  Activity Ambulated with assistance in hallway  Level of Assistance Standby assist, set-up cues, supervision of patient - no hands on  Assistive Device None  Distance Ambulated (ft) 50 ft  Activity Response Tolerated fair  Mobility Referral Yes  $Mobility charge 1 Mobility  Mobility Specialist Start Time (ACUTE ONLY) 1230  Mobility Specialist Stop Time (ACUTE ONLY) 1242  Mobility Specialist Time Calculation (min) (ACUTE ONLY) 12 min   Pre Mobility: 75 HR , 93% SpO2 During Mobility: 88 HR ,92%-95% SpO2 Post Mobility: 79 HR , 94% SpO2  Pt received in bed, agreeable to mobility. Displayed heavy DOE limiting gait distance. Pt resting  on handrails in hallway for rest. Pursed lip breathing encouraged. Pt denied any lightheadedness during session. SpO2 levels >90% with good pleth. Pt left in bed asymptomatic with call bell in reach and all needs met. RN notified.    Stacey Lin  Mobility Specialist Please contact via [employer] office at [phone]

## 2023-07-27 NOTE — TOC Initial Note (Addendum)
Transition of Care (TOC) - Initial/Assessment Note  From home with relatives, has PCP , [name]  and insurance on file, states has  an aide with [facility] from 9am to 11 am everyday, she has a walker and a cane at home.   States family member will transport her home at [employer] and family is support system, states gets medications from [facility] (used to be [facility]).  Pta ambulatory with walker or cane.  Patient will need neb machine at dc and may need home oxygen ,will await ambulatory sats. Patient states she has no preference for DME supplier.  May need ast for transport at dc.   Patient Details  Name: Stacey Lin MRN: [id number] Date of Birth: 1966-09-19  Transition of Care [facility]) CM/SW Contact:    Leone Haven, RN Phone Number: 07/27/2023, 12:31 PM  Clinical Narrative:                   Expected Discharge Plan: Home/Self Care Barriers to Discharge: Continued Medical Work up   Patient Goals and CMS Choice Patient states their goals for this hospitalization and ongoing recovery are:: return home   Choice offered to / list presented to : NA      Expected Discharge Plan and Services In-house Referral: NA Discharge Planning Services: CM Consult Post Acute Care Choice: NA Living arrangements for the past 2 months: Single Family Home                 DME Arranged: Nebulizer machine DME Agency: [employer] Date DME Agency Contacted: 07/27/23 Time DME Agency Contacted: 1228 Representative spoke with at DME Agency: Vaughan Basta HH Arranged: NA          Prior Living Arrangements/Services Living arrangements for the past 2 months: Single Family Home Lives with:: Relatives Patient language and need for interpreter reviewed:: Yes Do you feel safe going back to the place where you live?: Yes      Need for Family Participation in Patient Care: Yes (Comment) Care giver support system in place?: Yes (comment) Current home services: DME (walker,  cane,) Criminal Activity/Legal Involvement Pertinent to Current Situation/Hospitalization: No - Comment as needed  Activities of Daily Living   ADL Screening (condition at time of admission) Independently performs ADLs?: No Does the patient have a NEW difficulty with bathing/dressing/toileting/self-feeding that is expected to last >3 days?: No Does the patient have a NEW difficulty with getting in/out of bed, walking, or climbing stairs that is expected to last >3 days?: No Does the patient have a NEW difficulty with communication that is expected to last >3 days?: No Is the patient deaf or have difficulty hearing?: No Does the patient have difficulty seeing, even when wearing glasses/contacts?: No Does the patient have difficulty concentrating, remembering, or making decisions?: No  Permission Sought/Granted Permission sought to share information with : Case Manager Permission granted to share information with : Yes, Verbal Permission Granted     Permission granted to share info w AGENCY: DME agency        Emotional Assessment Appearance:: Appears stated age Attitude/Demeanor/Rapport: Engaged Affect (typically observed): Appropriate Orientation: : Oriented to Self, Oriented to Place, Oriented to  Time, Oriented to Situation   Psych Involvement: No (comment)  Admission diagnosis:  Respiratory distress [R06.03] COPD exacerbation (HCC) [J44.1] Patient Active Problem List   Diagnosis Date Noted   COPD exacerbation (HCC) 07/25/2023   Nonrheumatic aortic valve insufficiency    Elevated LFTs 04/08/2022   Acute metabolic encephalopathy 04/08/2022  Depressed mood 04/08/2022   Headache 04/07/2022   Obesity (BMI 30-39.9) 04/07/2022   Acute exacerbation of CHF (congestive heart failure) (HCC) 04/06/2022   Polysubstance abuse (HCC) 04/06/2022   Elevated troponin 04/06/2022   Controlled type 2 diabetes mellitus with hyperglycemia (HCC) 04/06/2022   Transaminitis 04/06/2022    Hypoalbuminemia 04/06/2022   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/11/2021   Upper airway cough syndrome 12/20/2018   Morbid obesity due to excess calories (HCC) 12/04/2018   Cigarette smoker 11/21/2018   Asthmatic bronchitis, moderate persistent, uncomplicated 11/20/2018   DOE (dyspnea on exertion) 04/14/2016   Streptococcus pneumoniae pneumonia (HCC) 04/07/2016   Acute respiratory failure with hypoxia (HCC) 04/03/2016   Elevated lactic acid level 04/03/2016   Leukocytosis 04/03/2016   New onset type 2 diabetes mellitus (HCC) 04/03/2016   Abnormal urinalysis 04/03/2016   Trichomonas infection 04/03/2016   Non compliance w medication regimen 01/12/2016   COPD without exacerbation (HCC) 12/04/2014   AKI (acute kidney injury) (HCC) 03/14/2014   Chronic systolic HF (heart failure) (HCC) 03/14/2014   Musculoskeletal chest pain 03/14/2014   Dyspnea 12/03/2013   Periodic health assessment, general screening, adult 10/03/2013   Mitral valve regurgitation 08/22/2013   FIBROIDS, UTERUS 05/11/2010   ANEMIA, SECONDARY TO BLOOD LOSS 05/11/2010   Asthmatic bronchitis 05/11/2010   TOBACCO USER 07/07/2009   Essential hypertension, benign 06/19/2009   MENORRHAGIA 06/19/2009   COCAINE ABUSE, HX OF 06/19/2009   PCP:  Grayce Sessions, NP Pharmacy:   [facility] [phone] [place], [place] - [address] AT [address] [place]  [address] Phone: [phone] Fax: [phone]  [facility] - [place], [place] - [address] [place] [address] Phone: [phone] Fax: [phone]  [facility] - [place], [place] - [address] [place] [address] Phone: [phone] Fax: [phone]  [facility] [address] [place] [address] Phone: [phone] Fax: [phone]     Social Determinants of Health  (SDOH) Social History: SDOH Screenings   Food Insecurity: No Food Insecurity (07/26/2023)  Housing: Medium Risk (07/26/2023)  Transportation Needs: Unmet Transportation Needs (07/26/2023)  Utilities: Not At Risk (07/26/2023)  Depression (PHQ2-9): High Risk (08/04/2022)  Tobacco Use: Medium Risk (07/25/2023)   SDOH Interventions:     Readmission Risk Interventions    04/13/2022   12:21 PM  Readmission Risk Prevention Plan  Transportation Screening Complete  PCP or Specialist Appt within 3-5 Days Complete  HRI or Home Care Consult Complete  Social Work Consult for Recovery Care Planning/Counseling Complete  Palliative Care Screening Not Applicable  Medication Review Oceanographer) Complete

## 2023-07-27 NOTE — Plan of Care (Signed)
  Problem: Education: Goal: Ability to describe self-care measures that may prevent or decrease complications (Diabetes Survival Skills Education) will improve Outcome: Adequate for Discharge Goal: Individualized Educational Video(s) Outcome: Adequate for Discharge   Problem: Coping: Goal: Ability to adjust to condition or change in health will improve Outcome: Adequate for Discharge   Problem: Health Behavior/Discharge Planning: Goal: Ability to identify and utilize available resources and services will improve Outcome: Adequate for Discharge Goal: Ability to manage health-related needs will improve Outcome: Adequate for Discharge   Problem: Metabolic: Goal: Ability to maintain appropriate glucose levels will improve Outcome: Adequate for Discharge   Problem: Nutritional: Goal: Maintenance of adequate nutrition will improve Outcome: Adequate for Discharge Goal: Progress toward achieving an optimal weight will improve Outcome: Adequate for Discharge   Problem: Skin Integrity: Goal: Risk for impaired skin integrity will decrease Outcome: Adequate for Discharge   Problem: Tissue Perfusion: Goal: Adequacy of tissue perfusion will improve Outcome: Adequate for Discharge   Problem: Education: Goal: Knowledge of General Education information will improve Description: Including pain rating scale, medication(s)/side effects and non-pharmacologic comfort measures Outcome: Adequate for Discharge   Problem: Health Behavior/Discharge Planning: Goal: Ability to manage health-related needs will improve Outcome: Adequate for Discharge   Problem: Clinical Measurements: Goal: Ability to maintain clinical measurements within normal limits will improve Outcome: Adequate for Discharge Goal: Will remain free from infection Outcome: Adequate for Discharge Goal: Diagnostic test results will improve Outcome: Adequate for Discharge Goal: Respiratory complications will improve Outcome:  Adequate for Discharge Goal: Cardiovascular complication will be avoided Outcome: Adequate for Discharge   Problem: Activity: Goal: Risk for activity intolerance will decrease Outcome: Adequate for Discharge   Problem: Nutrition: Goal: Adequate nutrition will be maintained Outcome: Adequate for Discharge   Problem: Coping: Goal: Level of anxiety will decrease Outcome: Adequate for Discharge   Problem: Elimination: Goal: Will not experience complications related to bowel motility Outcome: Adequate for Discharge Goal: Will not experience complications related to urinary retention Outcome: Adequate for Discharge   Problem: Pain Managment: Goal: General experience of comfort will improve Outcome: Adequate for Discharge   Problem: Safety: Goal: Ability to remain free from injury will improve Outcome: Adequate for Discharge   Problem: Skin Integrity: Goal: Risk for impaired skin integrity will decrease Outcome: Adequate for Discharge

## 2023-07-27 NOTE — Plan of Care (Signed)
  Problem: Education: ?Goal: Knowledge of General Education information will improve ?Description: Including pain rating scale, medication(s)/side effects and non-pharmacologic comfort measures ?Outcome: Progressing ?  ?Problem: Clinical Measurements: ?Goal: Ability to maintain clinical measurements within normal limits will improve ?Outcome: Progressing ?Goal: Will remain free from infection ?Outcome: Progressing ?Goal: Diagnostic test results will improve ?Outcome: Progressing ?Goal: Respiratory complications will improve ?Outcome: Progressing ?Goal: Cardiovascular complication will be avoided ?Outcome: Progressing ?  ?Problem: Activity: ?Goal: Risk for activity intolerance will decrease ?Outcome: Progressing ?  ?Problem: Nutrition: ?Goal: Adequate nutrition will be maintained ?Outcome: Progressing ?  ?Problem: Coping: ?Goal: Level of anxiety will decrease ?Outcome: Progressing ?  ?Problem: Elimination: ?Goal: Will not experience complications related to bowel motility ?Outcome: Progressing ?Goal: Will not experience complications related to urinary retention ?Outcome: Progressing ?  ?Problem: Pain Managment: ?Goal: General experience of comfort will improve ?Outcome: Progressing ?  ?Problem: Safety: ?Goal: Ability to remain free from injury will improve ?Outcome: Progressing ?  ?Problem: Skin Integrity: ?Goal: Risk for impaired skin integrity will decrease ?Outcome: Progressing ?  ?Problem: Health Behavior/Discharge Planning: ?Goal: Ability to manage health-related needs will improve ?Outcome: Not Progressing ?  ?

## 2023-07-27 NOTE — Progress Notes (Signed)
1500 pt refused to wait to get covid vaccine or pneumonia vaccine 8 people at bedside wanting to leave ASAP pt discharge home alert x4 able to make all needs known on room air nebulizer was delivered patient taking home patient home caregiver also at bedside

## 2023-07-27 NOTE — Discharge Summary (Signed)
Name: Stacey Lin MRN: [id number] DOB: 1966/10/04 57 y.o. PCP: Grayce Sessions, NP  Date of Admission: 07/25/2023  6:38 AM Date of Discharge:  07/27/23 Attending Physician: Dr. Criselda Peaches  DISCHARGE DIAGNOSIS:  Primary Problem: COPD exacerbation [facility])   Hospital Problems: Principal Problem:   COPD exacerbation (HCC)    DISCHARGE MEDICATIONS:   Allergies as of 07/27/2023       Reactions   Ketoconazole Rash        Medication List     TAKE these medications    Accu-Chek Aviva Plus w/Device Kit 1 each by Does not apply route 3 (three) times daily.   Accu-Chek Guide test strip Generic drug: glucose blood TEST THREE TIMES DAILY   Accu-Chek Softclix Lancets lancets TEST BLOOD SUGAR THREE TIMES DAILY   acetaminophen 500 MG tablet Commonly known as: TYLENOL Take 500 mg by mouth every 6 (six) hours as needed for moderate pain or headache.   albuterol 108 (90 Base) MCG/ACT inhaler Commonly known as: VENTOLIN HFA Inhale 2 puffs into the lungs every 4 (four) hours as needed for wheezing or shortness of breath.   allopurinol 100 MG tablet Commonly known as: ZYLOPRIM TAKE ONE TABLET BY MOUTH TWICE DAILY   carvedilol 3.125 MG tablet Commonly known as: COREG Take 1 tablet (3.125 mg total) by mouth 2 (two) times daily with a meal.   escitalopram 20 MG tablet Commonly known as: LEXAPRO TAKE ONE TABLET BY MOUTH ONCE DAILY   Farxiga 10 MG Tabs tablet Generic drug: dapagliflozin propanediol TAKE 1 TABLET BY MOUTH ONCE DAILY   furosemide 40 MG tablet Commonly known as: LASIX TAKE ONE TABLET BY MOUTH TWICE DAILY * Please calls and schedule appt for further refills   gabapentin 300 MG capsule Commonly known as: NEURONTIN Take 1 capsule (300 mg total) by mouth 2 (two) times daily.   guaiFENesin-dextromethorphan 100-10 MG/5ML syrup Commonly known as: ROBITUSSIN DM Take 5 mLs by mouth every 4 (four) hours as needed for cough.   hydrOXYzine 10 MG tablet Commonly  known as: ATARAX TAKE ONE TABLET BY MOUTH THREE TIMES DAILY AS NEEDED   ipratropium-albuterol 0.5-2.5 (3) MG/3ML Soln Commonly known as: DUONEB Take 3 mLs by nebulization 2 (two) times daily. Notes to patient: Use Neb machine twice daily   losartan 25 MG tablet Commonly known as: COZAAR Take 1 tablet (25 mg total) by mouth daily.   metFORMIN 500 MG 24 hr tablet Commonly known as: GLUCOPHAGE-XR TAKE ONE TABLET BY MOUTH ONCE DAILY   montelukast 10 MG tablet Commonly known as: SINGULAIR Take 1 tablet (10 mg total) by mouth at bedtime. What changed: See the new instructions. Notes to patient: Take for allergies    naloxone 4 MG/0.1ML Liqd nasal spray kit Commonly known as: NARCAN Inject intranasal for overdose   predniSONE 20 MG tablet Commonly known as: DELTASONE Take 2 tablets (40 mg total) by mouth daily.   spironolactone 25 MG tablet Commonly known as: ALDACTONE TAKE ONE TABLET BY MOUTH ONCE DAILY   Symbicort 80-4.5 MCG/ACT inhaler Generic drug: budesonide-formoterol INHALE TWO (2) PUFFS BY MOUTH TWICE DAILY AS NEEDED FOR SHORTNESS OF BREATH AND/OR WHEEZING *REFILL REQUEST*   tiZANidine 2 MG tablet Commonly known as: ZANAFLEX Take 1 tablet (2 mg total) by mouth every 8 (eight) hours as needed for muscle spasms.               Durable Medical Equipment  (From admission, onward)           Start  07/26/2023 Medical Rec #:  [id number]       Height:       61.0 in Accession #:    [id number]      Weight:       251.3 lb Date of Birth:  05/10/1966       BSA:           2.081 m Patient Age:    57 years        BP:           135/74 mmHg Patient Gender: F               HR:           71 bpm. Exam Location:  Inpatient Procedure: 2D Echo, Color Doppler and Cardiac Doppler Indications:    CHF  History:        Patient has prior history of Echocardiogram examinations, most                 recent 04/07/2022. CHF, COPD, Mitral Valve Disease; Risk                 Factors:Cocaine Abuse, Morbid Obesity, Diabetes and                 Hypertension.  Sonographer:    Milbert Coulter Referring Phys: [id number] EMILY B MULLEN IMPRESSIONS  1. Left ventricular ejection fraction, by estimation, is 40 to 45%. The left ventricle has mildly decreased function. The left ventricle demonstrates global hypokinesis. The left ventricular internal cavity size was mildly dilated. Left ventricular diastolic parameters are consistent with Grade I diastolic dysfunction (impaired relaxation).  2. Right ventricular systolic function is normal. The right ventricular size is normal. There is severely elevated pulmonary artery systolic pressure. The estimated right ventricular systolic pressure is 65.4 mmHg.  3. Left atrial size was mildly dilated.  4. Restricted posterior mitral leaflet motion, probably postinflammatory changes (rheumatic?). Mitral gradients were suboptimally evaluated on the current study, but visually there is at most mild mitral stenosis. The mitral valve is abnormal. Moderate to severe mitral valve regurgitation.  5. Tricuspid valve regurgitation is mild to moderate.  6. The aortic valve was not well visualized. There is mild thickening of the aortic valve. Aortic valve regurgitation is moderate. No aortic stenosis is present. Aortic regurgitation PHT measures 311 msec. Aortic valve mean gradient measures 10.0 mmHg.  7. The inferior vena cava is dilated in size with <50% respiratory variability, suggesting right atrial pressure of 15 mmHg. Comparison(s): Prior images reviewed side by side. The left  ventricular function has improved. The LV chamber is less dilated. Mean left atrial pressure appears to be lower, but there is significant pulmonary artery hypertension and elevated right atrial  pressure. The mitral and aortic insufficiency appear to be unchanged in severity. FINDINGS  Left Ventricle: Left ventricular ejection fraction, by estimation, is 40 to 45%. The left ventricle has mildly decreased function. The left ventricle demonstrates global hypokinesis. The left ventricular internal cavity size was mildly dilated. There is  no left ventricular hypertrophy. Left ventricular diastolic parameters are consistent with Grade I diastolic dysfunction (impaired relaxation). Right Ventricle: The right ventricular size is normal. No increase in right ventricular wall thickness. Right ventricular systolic function is normal. There is severely elevated pulmonary artery systolic pressure. The tricuspid regurgitant velocity is 3.55 m/s, and with an assumed right atrial pressure of 15 mmHg, the estimated right ventricular systolic pressure is 65.4 mmHg. Left Atrium: Left atrial size was mildly dilated.  Name: Stacey Lin MRN: [id number] DOB: 1966/10/04 57 y.o. PCP: Grayce Sessions, NP  Date of Admission: 07/25/2023  6:38 AM Date of Discharge:  07/27/23 Attending Physician: Dr. Criselda Peaches  DISCHARGE DIAGNOSIS:  Primary Problem: COPD exacerbation [facility])   Hospital Problems: Principal Problem:   COPD exacerbation (HCC)    DISCHARGE MEDICATIONS:   Allergies as of 07/27/2023       Reactions   Ketoconazole Rash        Medication List     TAKE these medications    Accu-Chek Aviva Plus w/Device Kit 1 each by Does not apply route 3 (three) times daily.   Accu-Chek Guide test strip Generic drug: glucose blood TEST THREE TIMES DAILY   Accu-Chek Softclix Lancets lancets TEST BLOOD SUGAR THREE TIMES DAILY   acetaminophen 500 MG tablet Commonly known as: TYLENOL Take 500 mg by mouth every 6 (six) hours as needed for moderate pain or headache.   albuterol 108 (90 Base) MCG/ACT inhaler Commonly known as: VENTOLIN HFA Inhale 2 puffs into the lungs every 4 (four) hours as needed for wheezing or shortness of breath.   allopurinol 100 MG tablet Commonly known as: ZYLOPRIM TAKE ONE TABLET BY MOUTH TWICE DAILY   carvedilol 3.125 MG tablet Commonly known as: COREG Take 1 tablet (3.125 mg total) by mouth 2 (two) times daily with a meal.   escitalopram 20 MG tablet Commonly known as: LEXAPRO TAKE ONE TABLET BY MOUTH ONCE DAILY   Farxiga 10 MG Tabs tablet Generic drug: dapagliflozin propanediol TAKE 1 TABLET BY MOUTH ONCE DAILY   furosemide 40 MG tablet Commonly known as: LASIX TAKE ONE TABLET BY MOUTH TWICE DAILY * Please calls and schedule appt for further refills   gabapentin 300 MG capsule Commonly known as: NEURONTIN Take 1 capsule (300 mg total) by mouth 2 (two) times daily.   guaiFENesin-dextromethorphan 100-10 MG/5ML syrup Commonly known as: ROBITUSSIN DM Take 5 mLs by mouth every 4 (four) hours as needed for cough.   hydrOXYzine 10 MG tablet Commonly  known as: ATARAX TAKE ONE TABLET BY MOUTH THREE TIMES DAILY AS NEEDED   ipratropium-albuterol 0.5-2.5 (3) MG/3ML Soln Commonly known as: DUONEB Take 3 mLs by nebulization 2 (two) times daily. Notes to patient: Use Neb machine twice daily   losartan 25 MG tablet Commonly known as: COZAAR Take 1 tablet (25 mg total) by mouth daily.   metFORMIN 500 MG 24 hr tablet Commonly known as: GLUCOPHAGE-XR TAKE ONE TABLET BY MOUTH ONCE DAILY   montelukast 10 MG tablet Commonly known as: SINGULAIR Take 1 tablet (10 mg total) by mouth at bedtime. What changed: See the new instructions. Notes to patient: Take for allergies    naloxone 4 MG/0.1ML Liqd nasal spray kit Commonly known as: NARCAN Inject intranasal for overdose   predniSONE 20 MG tablet Commonly known as: DELTASONE Take 2 tablets (40 mg total) by mouth daily.   spironolactone 25 MG tablet Commonly known as: ALDACTONE TAKE ONE TABLET BY MOUTH ONCE DAILY   Symbicort 80-4.5 MCG/ACT inhaler Generic drug: budesonide-formoterol INHALE TWO (2) PUFFS BY MOUTH TWICE DAILY AS NEEDED FOR SHORTNESS OF BREATH AND/OR WHEEZING *REFILL REQUEST*   tiZANidine 2 MG tablet Commonly known as: ZANAFLEX Take 1 tablet (2 mg total) by mouth every 8 (eight) hours as needed for muscle spasms.               Durable Medical Equipment  (From admission, onward)           Start  07/26/2023 Medical Rec #:  [id number]       Height:       61.0 in Accession #:    [id number]      Weight:       251.3 lb Date of Birth:  05/10/1966       BSA:           2.081 m Patient Age:    57 years        BP:           135/74 mmHg Patient Gender: F               HR:           71 bpm. Exam Location:  Inpatient Procedure: 2D Echo, Color Doppler and Cardiac Doppler Indications:    CHF  History:        Patient has prior history of Echocardiogram examinations, most                 recent 04/07/2022. CHF, COPD, Mitral Valve Disease; Risk                 Factors:Cocaine Abuse, Morbid Obesity, Diabetes and                 Hypertension.  Sonographer:    Milbert Coulter Referring Phys: [id number] EMILY B MULLEN IMPRESSIONS  1. Left ventricular ejection fraction, by estimation, is 40 to 45%. The left ventricle has mildly decreased function. The left ventricle demonstrates global hypokinesis. The left ventricular internal cavity size was mildly dilated. Left ventricular diastolic parameters are consistent with Grade I diastolic dysfunction (impaired relaxation).  2. Right ventricular systolic function is normal. The right ventricular size is normal. There is severely elevated pulmonary artery systolic pressure. The estimated right ventricular systolic pressure is 65.4 mmHg.  3. Left atrial size was mildly dilated.  4. Restricted posterior mitral leaflet motion, probably postinflammatory changes (rheumatic?). Mitral gradients were suboptimally evaluated on the current study, but visually there is at most mild mitral stenosis. The mitral valve is abnormal. Moderate to severe mitral valve regurgitation.  5. Tricuspid valve regurgitation is mild to moderate.  6. The aortic valve was not well visualized. There is mild thickening of the aortic valve. Aortic valve regurgitation is moderate. No aortic stenosis is present. Aortic regurgitation PHT measures 311 msec. Aortic valve mean gradient measures 10.0 mmHg.  7. The inferior vena cava is dilated in size with <50% respiratory variability, suggesting right atrial pressure of 15 mmHg. Comparison(s): Prior images reviewed side by side. The left  ventricular function has improved. The LV chamber is less dilated. Mean left atrial pressure appears to be lower, but there is significant pulmonary artery hypertension and elevated right atrial  pressure. The mitral and aortic insufficiency appear to be unchanged in severity. FINDINGS  Left Ventricle: Left ventricular ejection fraction, by estimation, is 40 to 45%. The left ventricle has mildly decreased function. The left ventricle demonstrates global hypokinesis. The left ventricular internal cavity size was mildly dilated. There is  no left ventricular hypertrophy. Left ventricular diastolic parameters are consistent with Grade I diastolic dysfunction (impaired relaxation). Right Ventricle: The right ventricular size is normal. No increase in right ventricular wall thickness. Right ventricular systolic function is normal. There is severely elevated pulmonary artery systolic pressure. The tricuspid regurgitant velocity is 3.55 m/s, and with an assumed right atrial pressure of 15 mmHg, the estimated right ventricular systolic pressure is 65.4 mmHg. Left Atrium: Left atrial size was mildly dilated.  Name: Stacey Lin MRN: [id number] DOB: 1966/10/04 57 y.o. PCP: Grayce Sessions, NP  Date of Admission: 07/25/2023  6:38 AM Date of Discharge:  07/27/23 Attending Physician: Dr. Criselda Peaches  DISCHARGE DIAGNOSIS:  Primary Problem: COPD exacerbation [facility])   Hospital Problems: Principal Problem:   COPD exacerbation (HCC)    DISCHARGE MEDICATIONS:   Allergies as of 07/27/2023       Reactions   Ketoconazole Rash        Medication List     TAKE these medications    Accu-Chek Aviva Plus w/Device Kit 1 each by Does not apply route 3 (three) times daily.   Accu-Chek Guide test strip Generic drug: glucose blood TEST THREE TIMES DAILY   Accu-Chek Softclix Lancets lancets TEST BLOOD SUGAR THREE TIMES DAILY   acetaminophen 500 MG tablet Commonly known as: TYLENOL Take 500 mg by mouth every 6 (six) hours as needed for moderate pain or headache.   albuterol 108 (90 Base) MCG/ACT inhaler Commonly known as: VENTOLIN HFA Inhale 2 puffs into the lungs every 4 (four) hours as needed for wheezing or shortness of breath.   allopurinol 100 MG tablet Commonly known as: ZYLOPRIM TAKE ONE TABLET BY MOUTH TWICE DAILY   carvedilol 3.125 MG tablet Commonly known as: COREG Take 1 tablet (3.125 mg total) by mouth 2 (two) times daily with a meal.   escitalopram 20 MG tablet Commonly known as: LEXAPRO TAKE ONE TABLET BY MOUTH ONCE DAILY   Farxiga 10 MG Tabs tablet Generic drug: dapagliflozin propanediol TAKE 1 TABLET BY MOUTH ONCE DAILY   furosemide 40 MG tablet Commonly known as: LASIX TAKE ONE TABLET BY MOUTH TWICE DAILY * Please calls and schedule appt for further refills   gabapentin 300 MG capsule Commonly known as: NEURONTIN Take 1 capsule (300 mg total) by mouth 2 (two) times daily.   guaiFENesin-dextromethorphan 100-10 MG/5ML syrup Commonly known as: ROBITUSSIN DM Take 5 mLs by mouth every 4 (four) hours as needed for cough.   hydrOXYzine 10 MG tablet Commonly  known as: ATARAX TAKE ONE TABLET BY MOUTH THREE TIMES DAILY AS NEEDED   ipratropium-albuterol 0.5-2.5 (3) MG/3ML Soln Commonly known as: DUONEB Take 3 mLs by nebulization 2 (two) times daily. Notes to patient: Use Neb machine twice daily   losartan 25 MG tablet Commonly known as: COZAAR Take 1 tablet (25 mg total) by mouth daily.   metFORMIN 500 MG 24 hr tablet Commonly known as: GLUCOPHAGE-XR TAKE ONE TABLET BY MOUTH ONCE DAILY   montelukast 10 MG tablet Commonly known as: SINGULAIR Take 1 tablet (10 mg total) by mouth at bedtime. What changed: See the new instructions. Notes to patient: Take for allergies    naloxone 4 MG/0.1ML Liqd nasal spray kit Commonly known as: NARCAN Inject intranasal for overdose   predniSONE 20 MG tablet Commonly known as: DELTASONE Take 2 tablets (40 mg total) by mouth daily.   spironolactone 25 MG tablet Commonly known as: ALDACTONE TAKE ONE TABLET BY MOUTH ONCE DAILY   Symbicort 80-4.5 MCG/ACT inhaler Generic drug: budesonide-formoterol INHALE TWO (2) PUFFS BY MOUTH TWICE DAILY AS NEEDED FOR SHORTNESS OF BREATH AND/OR WHEEZING *REFILL REQUEST*   tiZANidine 2 MG tablet Commonly known as: ZANAFLEX Take 1 tablet (2 mg total) by mouth every 8 (eight) hours as needed for muscle spasms.               Durable Medical Equipment  (From admission, onward)           Start  07/26/2023 Medical Rec #:  [id number]       Height:       61.0 in Accession #:    [id number]      Weight:       251.3 lb Date of Birth:  05/10/1966       BSA:           2.081 m Patient Age:    57 years        BP:           135/74 mmHg Patient Gender: F               HR:           71 bpm. Exam Location:  Inpatient Procedure: 2D Echo, Color Doppler and Cardiac Doppler Indications:    CHF  History:        Patient has prior history of Echocardiogram examinations, most                 recent 04/07/2022. CHF, COPD, Mitral Valve Disease; Risk                 Factors:Cocaine Abuse, Morbid Obesity, Diabetes and                 Hypertension.  Sonographer:    Milbert Coulter Referring Phys: [id number] EMILY B MULLEN IMPRESSIONS  1. Left ventricular ejection fraction, by estimation, is 40 to 45%. The left ventricle has mildly decreased function. The left ventricle demonstrates global hypokinesis. The left ventricular internal cavity size was mildly dilated. Left ventricular diastolic parameters are consistent with Grade I diastolic dysfunction (impaired relaxation).  2. Right ventricular systolic function is normal. The right ventricular size is normal. There is severely elevated pulmonary artery systolic pressure. The estimated right ventricular systolic pressure is 65.4 mmHg.  3. Left atrial size was mildly dilated.  4. Restricted posterior mitral leaflet motion, probably postinflammatory changes (rheumatic?). Mitral gradients were suboptimally evaluated on the current study, but visually there is at most mild mitral stenosis. The mitral valve is abnormal. Moderate to severe mitral valve regurgitation.  5. Tricuspid valve regurgitation is mild to moderate.  6. The aortic valve was not well visualized. There is mild thickening of the aortic valve. Aortic valve regurgitation is moderate. No aortic stenosis is present. Aortic regurgitation PHT measures 311 msec. Aortic valve mean gradient measures 10.0 mmHg.  7. The inferior vena cava is dilated in size with <50% respiratory variability, suggesting right atrial pressure of 15 mmHg. Comparison(s): Prior images reviewed side by side. The left  ventricular function has improved. The LV chamber is less dilated. Mean left atrial pressure appears to be lower, but there is significant pulmonary artery hypertension and elevated right atrial  pressure. The mitral and aortic insufficiency appear to be unchanged in severity. FINDINGS  Left Ventricle: Left ventricular ejection fraction, by estimation, is 40 to 45%. The left ventricle has mildly decreased function. The left ventricle demonstrates global hypokinesis. The left ventricular internal cavity size was mildly dilated. There is  no left ventricular hypertrophy. Left ventricular diastolic parameters are consistent with Grade I diastolic dysfunction (impaired relaxation). Right Ventricle: The right ventricular size is normal. No increase in right ventricular wall thickness. Right ventricular systolic function is normal. There is severely elevated pulmonary artery systolic pressure. The tricuspid regurgitant velocity is 3.55 m/s, and with an assumed right atrial pressure of 15 mmHg, the estimated right ventricular systolic pressure is 65.4 mmHg. Left Atrium: Left atrial size was mildly dilated.

## 2023-07-31 ENCOUNTER — Telehealth: Payer: Self-pay | Admitting: Primary Care

## 2023-07-31 NOTE — Transitions of Care (Post Inpatient/ED Visit) (Signed)
07/31/2023  Name: Stacey Lin MRN: [id number] DOB: 09/19/1966  Today's TOC FU Call Status: Today's TOC FU Call Status:: Unsuccessful Call (1st Attempt) Unsuccessful Call (1st Attempt) Date: 07/31/23  Attempted to reach the patient regarding the most recent Inpatient/ED visit.  Follow Up Plan: Additional outreach attempts will be made to reach the patient to complete the Transitions of Care (Post Inpatient/ED visit) call.   Deidre Ala, RN Medical illustrator [facility] [phone]

## 2023-08-01 ENCOUNTER — Telehealth: Payer: Self-pay

## 2023-08-01 NOTE — Transitions of Care (Post Inpatient/ED Visit) (Signed)
08/01/2023  Name: Stacey Lin MRN: [id number] DOB: 1966-03-28  Today's TOC FU Call Status: Today's TOC FU Call Status:: Unsuccessful Call (2nd Attempt) Unsuccessful Call (2nd Attempt) Date: 08/01/23  Attempted to reach the patient regarding the most recent Inpatient/ED visit.  Follow Up Plan: Additional outreach attempts will be made to reach the patient to complete the Transitions of Care (Post Inpatient/ED visit) call.   Deidre Ala, RN Medical illustrator [facility] [phone]

## 2023-08-02 ENCOUNTER — Telehealth: Payer: Self-pay

## 2023-08-02 ENCOUNTER — Ambulatory Visit (INDEPENDENT_AMBULATORY_CARE_PROVIDER_SITE_OTHER): Payer: 59 | Admitting: Primary Care

## 2023-08-02 NOTE — Transitions of Care (Post Inpatient/ED Visit) (Signed)
08/02/2023  Name: Stacey Lin MRN: [id number] DOB: 1966-02-02  Today's TOC FU Call Status:    Attempted to reach the patient regarding the most recent Inpatient/ED visit.  Follow Up Plan: No further outreach attempts will be made at this time. We have been unable to contact the patient.  The patient has a PCP appointment 08/03/2023  Deidre Ala, RN RN Care Manager [facility] [phone]

## 2023-08-03 ENCOUNTER — Encounter (INDEPENDENT_AMBULATORY_CARE_PROVIDER_SITE_OTHER): Payer: Self-pay | Admitting: Primary Care

## 2023-08-03 ENCOUNTER — Other Ambulatory Visit (INDEPENDENT_AMBULATORY_CARE_PROVIDER_SITE_OTHER): Payer: Self-pay

## 2023-08-03 ENCOUNTER — Ambulatory Visit (INDEPENDENT_AMBULATORY_CARE_PROVIDER_SITE_OTHER): Payer: 59 | Admitting: Primary Care

## 2023-08-03 VITALS — BP 141/79 | HR 76 | Resp 16

## 2023-08-03 DIAGNOSIS — Z7984 Long term (current) use of oral hypoglycemic drugs: Secondary | ICD-10-CM

## 2023-08-03 DIAGNOSIS — E119 Type 2 diabetes mellitus without complications: Secondary | ICD-10-CM | POA: Diagnosis not present

## 2023-08-03 DIAGNOSIS — F418 Other specified anxiety disorders: Secondary | ICD-10-CM

## 2023-08-03 DIAGNOSIS — F17210 Nicotine dependence, cigarettes, uncomplicated: Secondary | ICD-10-CM | POA: Diagnosis not present

## 2023-08-03 DIAGNOSIS — Z1231 Encounter for screening mammogram for malignant neoplasm of breast: Secondary | ICD-10-CM

## 2023-08-03 DIAGNOSIS — Z09 Encounter for follow-up examination after completed treatment for conditions other than malignant neoplasm: Secondary | ICD-10-CM

## 2023-08-03 DIAGNOSIS — J441 Chronic obstructive pulmonary disease with (acute) exacerbation: Secondary | ICD-10-CM | POA: Diagnosis not present

## 2023-08-03 DIAGNOSIS — R051 Acute cough: Secondary | ICD-10-CM

## 2023-08-03 DIAGNOSIS — Z9189 Other specified personal risk factors, not elsewhere classified: Secondary | ICD-10-CM

## 2023-08-03 MED ORDER — QUETIAPINE FUMARATE 25 MG PO TABS
25.0000 mg | ORAL_TABLET | Freq: Every day | ORAL | 1 refills | Status: DC
Start: 2023-08-03 — End: 2024-02-21

## 2023-08-03 MED ORDER — GUAIFENESIN-DM 100-10 MG/5ML PO SYRP: 5.0000 mL | ORAL_SOLUTION | ORAL | 0 refills | Status: DC | PRN

## 2023-08-03 NOTE — Progress Notes (Signed)
[facility]   Subjective:   Stacey Lin is a 57 y.o. female presents for hospital follow up . Presented who presented with respiratory distress .Admit date to the hospital was 07/25/23, patient was discharged from the hospital on 07/27/23, patient was admitted for: COPD exacerbation . Asked the last time use cocaine the week she went into the hospital. Patient presented in a irritating position cause confusion and disrespect CMA had to talk nurse , patient and 24/hr sitter. Resolved issue patient will not disrespect staff. Patient apologize to nurse as she was crying. Insomnia - did not sleep last night. She has a productive cough clear shown Clinical research associate. Patient has No headache, No chest pain, No abdominal pain - No Nausea, No new weakness tingling or numbness, No Cough - shortness of breath . Problem anxiety , depression uses others medication.   Past Medical History:  Diagnosis Date   Anemia    Arrhythmia    Asthma    CHF (congestive heart failure) (HCC)    1990s   Chronic headaches    COPD (chronic obstructive pulmonary disease) (HCC)    ??   Hypercholesteremia    Hypertension      Allergies  Allergen Reactions   Ketoconazole Rash      Current Outpatient Medications on File Prior to Visit  Medication Sig Dispense Refill   ACCU-CHEK GUIDE test strip TEST THREE TIMES DAILY 300 strip 0   Accu-Chek Softclix Lancets lancets TEST BLOOD SUGAR THREE TIMES DAILY 300 each 0   acetaminophen (TYLENOL) 500 MG tablet Take 500 mg by mouth every 6 (six) hours as needed for moderate pain or headache.     albuterol (VENTOLIN HFA) 108 (90 Base) MCG/ACT inhaler Inhale 2 puffs into the lungs every 4 (four) hours as needed for wheezing or shortness of breath. 17 g 1   allopurinol (ZYLOPRIM) 100 MG tablet TAKE ONE TABLET BY MOUTH TWICE DAILY 180 tablet 0   Blood Glucose Monitoring Suppl (ACCU-CHEK AVIVA PLUS) w/Device KIT 1 each by Does not apply route 3 (three) times daily. 1 kit 0    budesonide-formoterol (SYMBICORT) 80-4.5 MCG/ACT inhaler INHALE TWO (2) PUFFS BY MOUTH TWICE DAILY AS NEEDED FOR SHORTNESS OF BREATH AND/OR WHEEZING *REFILL REQUEST* 10.2 g 2   carvedilol (COREG) 3.125 MG tablet Take 1 tablet (3.125 mg total) by mouth 2 (two) times daily with a meal. 30 tablet 1   dapagliflozin propanediol (FARXIGA) 10 MG TABS tablet TAKE 1 TABLET BY MOUTH ONCE DAILY 30 tablet 1   escitalopram (LEXAPRO) 20 MG tablet TAKE ONE TABLET BY MOUTH ONCE DAILY 90 tablet 5   furosemide (LASIX) 40 MG tablet TAKE ONE TABLET BY MOUTH TWICE DAILY * Please calls and schedule appt for further refills 30 tablet 0   gabapentin (NEURONTIN) 300 MG capsule Take 1 capsule (300 mg total) by mouth 2 (two) times daily. 60 capsule 0   guaiFENesin-dextromethorphan (ROBITUSSIN DM) 100-10 MG/5ML syrup Take 5 mLs by mouth every 4 (four) hours as needed for cough. 237 mL 0   hydrOXYzine (ATARAX) 10 MG tablet TAKE ONE TABLET BY MOUTH THREE TIMES DAILY AS NEEDED 30 tablet 0   ipratropium-albuterol (DUONEB) 0.5-2.5 (3) MG/3ML SOLN Take 3 mLs by nebulization 2 (two) times daily. 180 mL 0   losartan (COZAAR) 25 MG tablet Take 1 tablet (25 mg total) by mouth daily. 15 tablet 0   metFORMIN (GLUCOPHAGE-XR) 500 MG 24 hr tablet TAKE ONE TABLET BY MOUTH ONCE DAILY 90 tablet 5   montelukast (  SINGULAIR) 10 MG tablet Take 1 tablet (10 mg total) by mouth at bedtime. 30 tablet 0   naloxone (NARCAN) nasal spray 4 mg/0.1 mL Inject intranasal for overdose 1 each 1   predniSONE (DELTASONE) 20 MG tablet Take 2 tablets (40 mg total) by mouth daily. 6 tablet 0   spironolactone (ALDACTONE) 25 MG tablet TAKE ONE TABLET BY MOUTH ONCE DAILY 30 tablet 6   tiZANidine (ZANAFLEX) 2 MG tablet Take 1 tablet (2 mg total) by mouth every 8 (eight) hours as needed for muscle spasms. 10 tablet 0   No current facility-administered medications on file prior to visit.     Review of System: Comprehensive ROS Pertinent positive and negative noted in  HPI    Objective:  LMP  (LMP Unknown)  BP (!) 141/79 (BP Location: Right Arm, Patient Position: Sitting, Cuff Size: Normal)   Pulse 76   Resp 16   LMP  (LMP Unknown)   SpO2 96%   Physical Exam: General Appearance: Well nourished, in no apparent distress. Eyes: PERRLA, EOMs, conjunctiva no swelling or erythema Sinuses: No Frontal/maxillary tenderness ENT/Mouth: Ext aud canals clear, TMs without erythema, bulging. No erythema, swelling, or exudate on post pharynx.  Tonsils not swollen or erythematous. Hearing normal.  Neck: Supple, thyroid normal.  Respiratory: Respiratory effort normal, BS equal bilaterally without rales, rhonchi, wheezing or stridor.  Cardio: RRR with no MRGs. Brisk peripheral pulses without edema.  Abdomen: Soft, + BS.  Non tender, no guarding, rebound, hernias, masses. Lymphatics: Non tender without lymphadenopathy.  Musculoskeletal: Full ROM, 5/5 strength, normal gait.  Skin: Warm, dry without rashes, lesions, ecchymosis.  Neuro: Cranial nerves intact. Normal muscle tone, no cerebellar symptoms. Sensation intact.  Psych: Awake and oriented X 3, normal affect, Insight and Judgment appropriate.    Assessment:   Modean was seen today for hospitalization follow-up.  Diagnoses and all orders for this visit:  Hospital discharge follow-up See HPI  Type 2 diabetes mellitus without complication, without long-term current use of insulin (HCC) -     Microalbumin / creatinine urine ratio -     Ambulatory referral to Ophthalmology  COPD exacerbation [facility]) 2/2  Hospital discharge follow-up  Cigarette smoker -     CT CHEST LUNG CA SCREEN LOW DOSE W/O CM; Future  COCAINE ABUSE, HX OF continues  Encounter for screening mammogram for malignant neoplasm of breast -     MM DIGITAL SCREENING BILATERAL; Future  Depression with anxiety -     Ambulatory referral to Psychiatry  Acute cough Requesting tussinex -no OTC does not like the pearls.   Disp Refills Start  End  guaiFENesin-dextromethorphan (ROBITUSSIN DM)         Other orders -     QUEtiapine (SEROQUEL) 25 MG tablet; Take 1 tablet (25 mg total) by mouth at bedtime.    This note has been created with Education officer, environmental. Any transcriptional errors are unintentional.   Grayce Sessions, NP 08/03/2023, 1:54 PM

## 2023-08-06 ENCOUNTER — Other Ambulatory Visit (INDEPENDENT_AMBULATORY_CARE_PROVIDER_SITE_OTHER): Payer: Self-pay | Admitting: Internal Medicine

## 2023-08-07 ENCOUNTER — Telehealth (INDEPENDENT_AMBULATORY_CARE_PROVIDER_SITE_OTHER): Payer: Self-pay | Admitting: Primary Care

## 2023-08-07 NOTE — Telephone Encounter (Signed)
Pt states she went to the pharmacy and wanted to get the RSV injection.  But it kept kicking the request out,  the pharmacy advised she needs to be 57 yrs old.  Pt has had respiratory issues (even been hospitalized numerous times). So this is a good option for her. The pharmacy advised her that if she could get a Rx for the RSV from her pcp, it may override the system.

## 2023-08-08 NOTE — Telephone Encounter (Signed)
Will forward to provider   Stacey Lin does this sound accurate?

## 2023-08-08 NOTE — Telephone Encounter (Signed)
Returned pt call to go over clinical pharmacist response in regards to vaccine pt didn't answer lvm asking for a call back

## 2023-08-13 ENCOUNTER — Other Ambulatory Visit (INDEPENDENT_AMBULATORY_CARE_PROVIDER_SITE_OTHER): Payer: Self-pay | Admitting: Primary Care

## 2023-08-16 ENCOUNTER — Ambulatory Visit: Payer: 59 | Attending: Primary Care | Admitting: Pharmacist

## 2023-08-16 DIAGNOSIS — E7841 Elevated Lipoprotein(a): Secondary | ICD-10-CM

## 2023-08-16 DIAGNOSIS — E1165 Type 2 diabetes mellitus with hyperglycemia: Secondary | ICD-10-CM | POA: Diagnosis not present

## 2023-08-16 DIAGNOSIS — Z7984 Long term (current) use of oral hypoglycemic drugs: Secondary | ICD-10-CM | POA: Diagnosis not present

## 2023-08-16 MED ORDER — ATORVASTATIN CALCIUM 40 MG PO TABS
40.0000 mg | ORAL_TABLET | Freq: Every day | ORAL | 3 refills | Status: DC
Start: 2023-08-16 — End: 2024-03-27

## 2023-08-16 NOTE — Progress Notes (Signed)
08/16/2023 Name: Ulla Dysinger MRN: [id number] DOB: 1966/01/09  No chief complaint on file.   Gerline Ratner is a 57 y.o. year old female who presented for a telephone visit.   They were referred to the pharmacist by a quality report for assistance in managing hyperlipidemia.    Subjective:  Care Team: Primary Care Provider: Grayce Sessions, NP   Medication Access/Adherence  Current Pharmacy:  [facility], [place] - [address] [place] [address] Phone: [phone] Fax: [phone]  Patient reports affordability concerns with their medications: No  Patient reports access/transportation concerns to their pharmacy: No  Patient reports adherence concerns with their medications:  No    Pharmacy Quality Measure Review  This patient is appearing on report for being at risk of failing the measure for Statin Use in Persons with Diabetes (SUPD) medications this calendar year. She used to take atorvastatin. Denies any side effects or intolerances. She is amenable to restarting the medication.   Objective:  Lab Results  Component Value Date   HGBA1C 6.4 (H) 07/26/2023    Lab Results  Component Value Date   CREATININE 0.96 07/27/2023   BUN 17 07/27/2023   NA 139 07/27/2023   K 3.8 07/27/2023   CL 105 07/27/2023   CO2 24 07/27/2023    Lab Results  Component Value Date   CHOL 173 07/26/2023   HDL 70 07/26/2023   LDLCALC 94 07/26/2023   TRIG 44 07/26/2023   CHOLHDL 2.5 07/26/2023    Medications Reviewed Today   Medications were not reviewed in this encounter      Assessment/Plan:   Hyperlipidemia/ASCVD Risk Reduction: - Currently above goal of <70 mg/dL.  - Reviewed long term complications of uncontrolled cholesterol - Pt is amenable to restarting atorvastatin. Will restart atorvastatin at 40 mg daily. She tolerated this well in the past.   Follow Up Plan: with PCP  Butch Penny,  PharmD, BCACP, CPP Clinical Pharmacist [facility] [phone]

## 2023-08-28 ENCOUNTER — Other Ambulatory Visit (HOSPITAL_COMMUNITY): Payer: Self-pay | Admitting: Internal Medicine

## 2023-08-29 ENCOUNTER — Encounter (HOSPITAL_COMMUNITY): Payer: 59 | Admitting: Internal Medicine

## 2023-09-06 ENCOUNTER — Other Ambulatory Visit (HOSPITAL_COMMUNITY): Payer: Self-pay | Admitting: Internal Medicine

## 2023-09-10 ENCOUNTER — Other Ambulatory Visit (INDEPENDENT_AMBULATORY_CARE_PROVIDER_SITE_OTHER): Payer: Self-pay | Admitting: Primary Care

## 2023-09-10 DIAGNOSIS — J4541 Moderate persistent asthma with (acute) exacerbation: Secondary | ICD-10-CM

## 2023-09-11 NOTE — Telephone Encounter (Signed)
Will forward to provider  

## 2023-09-12 ENCOUNTER — Telehealth (HOSPITAL_COMMUNITY): Payer: Self-pay | Admitting: *Deleted

## 2023-09-12 NOTE — Telephone Encounter (Signed)
Pt last seen in [facility] in 2021, and no showed to last appt, now requesting refill of Farxiga.  Attempted to call pt to discuss, cell # VM full, home # "not available at this time"  MyChart message sent

## 2023-09-13 ENCOUNTER — Telehealth (HOSPITAL_COMMUNITY): Payer: Self-pay | Admitting: Licensed Clinical Social Worker

## 2023-09-13 NOTE — Telephone Encounter (Signed)
CSW attempted multiple times to reach patient to discuss missed appointments. CSW unable to leave message. Please contact if patient returns call. Lasandra Beech, LCSW, CCSW-MCS [phone]

## 2023-09-14 NOTE — Telephone Encounter (Signed)
Left message to call back  on mobile number, home number "not available"

## 2023-09-19 ENCOUNTER — Other Ambulatory Visit (INDEPENDENT_AMBULATORY_CARE_PROVIDER_SITE_OTHER): Payer: Self-pay | Admitting: Primary Care

## 2023-10-03 ENCOUNTER — Other Ambulatory Visit (HOSPITAL_COMMUNITY): Payer: Self-pay | Admitting: Internal Medicine

## 2023-10-06 ENCOUNTER — Telehealth (INDEPENDENT_AMBULATORY_CARE_PROVIDER_SITE_OTHER): Payer: Self-pay | Admitting: Primary Care

## 2023-10-06 LAB — BASIC METABOLIC PANEL: Creatinine: 0.8 (ref 0.5–1.1)

## 2023-10-06 LAB — COMPREHENSIVE METABOLIC PANEL: eGFR: 82

## 2023-10-06 NOTE — Telephone Encounter (Signed)
Will forward to provider  

## 2023-10-06 NOTE — Telephone Encounter (Signed)
Medication Refill -  Most Recent Primary Care Visit:  Provider: Drucilla Chalet  Department: [facility]  Visit Type: TELEPHONE OFFICE VISIT  Date: 08/16/2023  Medication:  losartan (COZAAR) 25 MG tablet   *Patient is completely out, per [place] with [facility]   Has the patient contacted their pharmacy? Yes, advised to contact PCP. [place] with [employer] has contacted on patient's behalf  Is this the correct pharmacy for this prescription? Yes, the one listed below   This is the patient's preferred pharmacy:  [facility], [place] - [address] [place] [address] Phone: [phone] Fax: [phone]   Has the prescription been filled recently?  Medication is in patient's discontinued meds?  Is the patient out of the medication?  Yes  Has the patient been seen for an appointment in the last year OR does the patient have an upcoming appointment?  Patient was last seen on 10.10.24 by PCP

## 2023-10-09 ENCOUNTER — Encounter (INDEPENDENT_AMBULATORY_CARE_PROVIDER_SITE_OTHER): Payer: Self-pay | Admitting: Primary Care

## 2023-10-12 LAB — MICROALBUMIN / CREATININE URINE RATIO: Microalb Creat Ratio: 29.999

## 2023-10-16 ENCOUNTER — Telehealth: Payer: Self-pay | Admitting: Primary Care

## 2023-10-16 ENCOUNTER — Emergency Department (HOSPITAL_COMMUNITY)
Admission: EM | Admit: 2023-10-16 | Discharge: 2023-10-16 | Disposition: A | Discharge status: Home / Self Care | Payer: 59 | Attending: Emergency Medicine | Admitting: Emergency Medicine

## 2023-10-16 ENCOUNTER — Emergency Department (HOSPITAL_COMMUNITY): Payer: 59

## 2023-10-16 ENCOUNTER — Other Ambulatory Visit: Payer: Self-pay

## 2023-10-16 DIAGNOSIS — Z79899 Other long term (current) drug therapy: Secondary | ICD-10-CM | POA: Diagnosis not present

## 2023-10-16 DIAGNOSIS — J449 Chronic obstructive pulmonary disease, unspecified: Secondary | ICD-10-CM | POA: Diagnosis not present

## 2023-10-16 DIAGNOSIS — Z7984 Long term (current) use of oral hypoglycemic drugs: Secondary | ICD-10-CM | POA: Diagnosis not present

## 2023-10-16 DIAGNOSIS — R404 Transient alteration of awareness: Secondary | ICD-10-CM | POA: Diagnosis not present

## 2023-10-16 DIAGNOSIS — I509 Heart failure, unspecified: Secondary | ICD-10-CM | POA: Insufficient documentation

## 2023-10-16 DIAGNOSIS — J45909 Unspecified asthma, uncomplicated: Secondary | ICD-10-CM | POA: Insufficient documentation

## 2023-10-16 DIAGNOSIS — R4182 Altered mental status, unspecified: Secondary | ICD-10-CM | POA: Diagnosis present

## 2023-10-16 DIAGNOSIS — I110 Hypertensive heart disease with heart failure: Secondary | ICD-10-CM | POA: Diagnosis not present

## 2023-10-16 DIAGNOSIS — F1490 Cocaine use, unspecified, uncomplicated: Secondary | ICD-10-CM | POA: Diagnosis not present

## 2023-10-16 DIAGNOSIS — F1410 Cocaine abuse, uncomplicated: Secondary | ICD-10-CM

## 2023-10-16 DIAGNOSIS — Z7951 Long term (current) use of inhaled steroids: Secondary | ICD-10-CM | POA: Insufficient documentation

## 2023-10-16 LAB — URINALYSIS, ROUTINE W REFLEX MICROSCOPIC
Bilirubin Urine: NEGATIVE
Glucose, UA: >=500 mg/dL — AB
Hgb urine dipstick: NEGATIVE
Ketones, ur: NEGATIVE mg/dL
Leukocytes,Ua: NEGATIVE
Nitrite: NEGATIVE
Protein, ur: NEGATIVE mg/dL
Specific Gravity, Urine: 1.02 (ref 1.005–1.030)
pH: 7 (ref 5.0–8.0)

## 2023-10-16 LAB — I-STAT VENOUS BLOOD GAS, ED
Acid-Base Excess: 3 mmol/L — ABNORMAL HIGH (ref 0.0–2.0)
Bicarbonate: 30.2 mmol/L — ABNORMAL HIGH (ref 20.0–28.0)
Calcium, Ion: 1.17 mmol/L (ref 1.15–1.40)
HCT: 43 % (ref 36.0–46.0)
Hemoglobin: 14.6 g/dL (ref 12.0–15.0)
O2 Saturation: 99 %
Potassium: 5 mmol/L (ref 3.5–5.1)
Sodium: 140 mmol/L (ref 135–145)
TCO2: 32 mmol/L (ref 22–32)
pCO2, Ven: 54.5 mm[Hg] (ref 44–60)
pH, Ven: 7.352 (ref 7.25–7.43)
pO2, Ven: 129 mm[Hg] — ABNORMAL HIGH (ref 32–45)

## 2023-10-16 LAB — COMPREHENSIVE METABOLIC PANEL
ALT: 26 U/L (ref 0–44)
AST: 35 U/L (ref 15–41)
Albumin: 3.6 g/dL (ref 3.5–5.0)
Alkaline Phosphatase: 104 U/L (ref 38–126)
Anion gap: 6 (ref 5–15)
BUN: 11 mg/dL (ref 6–20)
CO2: 24 mmol/L (ref 22–32)
Calcium: 9.1 mg/dL (ref 8.9–10.3)
Chloride: 105 mmol/L (ref 98–111)
Creatinine, Ser: 1.04 mg/dL — ABNORMAL HIGH (ref 0.44–1.00)
GFR, Estimated: >60 mL/min (ref >60)
Glucose, Bld: 138 mg/dL — ABNORMAL HIGH (ref 70–99)
Potassium: 5.1 mmol/L (ref 3.5–5.1)
Sodium: 135 mmol/L (ref 135–145)
Total Bilirubin: 1.3 mg/dL — ABNORMAL HIGH (ref <1.2)
Total Protein: 6.3 g/dL — ABNORMAL LOW (ref 6.5–8.1)

## 2023-10-16 LAB — CBC WITH DIFFERENTIAL/PLATELET
Abs Immature Granulocytes: 0.02 10*3/uL (ref 0.00–0.07)
Basophils Absolute: 0 10*3/uL (ref 0.0–0.1)
Basophils Relative: 0 %
Eosinophils Absolute: 0.2 10*3/uL (ref 0.0–0.5)
Eosinophils Relative: 2 %
HCT: 44.9 % (ref 36.0–46.0)
Hemoglobin: 14 g/dL (ref 12.0–15.0)
Immature Granulocytes: 0 %
Lymphocytes Relative: 18 %
Lymphs Abs: 2 10*3/uL (ref 0.7–4.0)
MCH: 30.3 pg (ref 26.0–34.0)
MCHC: 31.2 g/dL (ref 30.0–36.0)
MCV: 97.2 fL (ref 80.0–100.0)
Monocytes Absolute: 0.6 10*3/uL (ref 0.1–1.0)
Monocytes Relative: 6 %
Neutro Abs: 8.2 10*3/uL — ABNORMAL HIGH (ref 1.7–7.7)
Neutrophils Relative %: 74 %
Platelets: 264 10*3/uL (ref 150–400)
RBC: 4.62 MIL/uL (ref 3.87–5.11)
RDW: 12.6 % (ref 11.5–15.5)
WBC: 11 10*3/uL — ABNORMAL HIGH (ref 4.0–10.5)
nRBC: 0 % (ref 0.0–0.2)

## 2023-10-16 LAB — ETHANOL: Alcohol, Ethyl (B): <10 mg/dL (ref <10)

## 2023-10-16 LAB — RAPID URINE DRUG SCREEN, HOSP PERFORMED
Amphetamines: NOT DETECTED
Barbiturates: NOT DETECTED
Benzodiazepines: NOT DETECTED
Cocaine: POSITIVE — AB
Opiates: NOT DETECTED
Tetrahydrocannabinol: NOT DETECTED

## 2023-10-16 LAB — CBG MONITORING, ED: Glucose-Capillary: 135 mg/dL — ABNORMAL HIGH (ref 70–99)

## 2023-10-16 LAB — AMMONIA: Ammonia: 28 umol/L (ref 9–35)

## 2023-10-16 MED ORDER — ACETAMINOPHEN 325 MG PO TABS
650.0000 mg | ORAL_TABLET | Freq: Once | ORAL | Status: AC
  Administered 2023-10-16: 650 mg via ORAL
  Filled 2023-10-16: qty 2

## 2023-10-16 NOTE — ED Notes (Signed)
Pt ambulated with assistance. Pt able to walk by herself with standby due to drowsiness.

## 2023-10-16 NOTE — ED Triage Notes (Signed)
EMS called for cardiac arrest, no CPR done pt had a pulse, just had agonal breathing, they assisted with ventilations for 4 RR/min. She started coming to a bit after the IV stick, talked a bit more, has hx of drug use and OD. Patient claims cocaine. Lethargic and obtunded.Frontal head pain is only complaint by patient.   BP 202/132 HR 76 RR 4 to 20  O2 95 4L CBG 202  IV 22 L. Hand No narcan, no zofran

## 2023-10-16 NOTE — ED Provider Notes (Signed)
[place] EMERGENCY DEPARTMENT AT [facility] Provider Note   CSN: [id number] Arrival date & time: 10/16/23  0010     History  Chief Complaint  Patient presents with   Respiratory Distress   Altered Mental Status   Level 5 caveat due to altered mental status Stacey Lin is a 57 y.o. female.  The history is provided by the EMS personnel.  Patient history of COPD, hypertension, substance use disorder presents for altered mental status Most of the history is provided by EMS Paramedic reports that they were called out for "CPR" On their arrival patient was not receiving CPR.  She had agonal respirations.  They began to bag patient and she started to wake up but was confused.  She never required CPR. EMS reports the patient's mental status was waxing and waning the entire time. It is reported patient was using crack cocaine earlier in the night   Past Medical History:  Diagnosis Date   Anemia    Arrhythmia    Asthma    CHF (congestive heart failure) (HCC)    1990s   Chronic headaches    COPD (chronic obstructive pulmonary disease) (HCC)    ??   Hypercholesteremia    Hypertension     Home Medications Prior to Admission medications   Medication Sig Start Date End Date Taking? Authorizing Provider  ACCU-CHEK GUIDE test strip TEST THREE TIMES DAILY 07/21/21   Ivonne Andrew, NP  Accu-Chek Softclix Lancets lancets TEST BLOOD SUGAR THREE TIMES DAILY 07/21/21   Ivonne Andrew, NP  acetaminophen (TYLENOL) 500 MG tablet Take 500 mg by mouth every 6 (six) hours as needed for moderate pain or headache.    [provider]  albuterol (VENTOLIN HFA) 108 (90 Base) MCG/ACT inhaler Inhale 2 puffs into the lungs every 4 (four) hours as needed for wheezing or shortness of breath. 07/22/23   Sabas Sous, MD  allopurinol (ZYLOPRIM) 100 MG tablet TAKE ONE TABLET BY MOUTH TWICE DAILY 01/02/23   Grayce Sessions, NP  atorvastatin (LIPITOR) 40 MG tablet Take 1 tablet  (40 mg total) by mouth daily. 08/16/23   Hoy Register, MD  Blood Glucose Monitoring Suppl (ACCU-CHEK AVIVA PLUS) w/Device KIT 1 each by Does not apply route 3 (three) times daily. 05/08/20   Hoy Register, MD  carvedilol (COREG) 3.125 MG tablet TAKE 1 TABLET BY MOUTH TWICE DAILY WITH A MEAL 08/07/23   Bensimhon, Bevelyn Buckles, MD  dapagliflozin propanediol (FARXIGA) 10 MG TABS tablet TAKE 1 TABLET BY MOUTH ONCE DAILY 09/19/23   Bensimhon, Bevelyn Buckles, MD  furosemide (LASIX) 40 MG tablet TAKE ONE TABLET BY MOUTH TWICE DAILY * Please calls and schedule appt for further refills 06/06/23   Bensimhon, Bevelyn Buckles, MD  gabapentin (NEURONTIN) 300 MG capsule TAKE ONE (1) CAPSULE BY MOUTH TWICE DAILY 09/11/23   Grayce Sessions, NP  hydrOXYzine (ATARAX) 10 MG tablet TAKE ONE TABLET BY MOUTH THREE TIMES DAILY AS NEEDED 01/02/23   Grayce Sessions, NP  ipratropium-albuterol (DUONEB) 0.5-2.5 (3) MG/3ML SOLN Take 3 mLs by nebulization 2 (two) times daily. 07/27/23   Morrie Sheldon, MD  metFORMIN (GLUCOPHAGE-XR) 500 MG 24 hr tablet TAKE ONE TABLET BY MOUTH ONCE DAILY 01/02/23   Grayce Sessions, NP  montelukast (SINGULAIR) 10 MG tablet TAKE 1 TABLET BY MOUTH AT BEDTIME 09/11/23   Grayce Sessions, NP  naloxone [facility]) nasal spray 4 mg/0.1 mL Inject intranasal for overdose 04/29/23   Rondel Baton, MD  QUEtiapine (SEROQUEL) 25 MG tablet Take 1 tablet (25 mg total) by mouth at bedtime. 08/03/23   Grayce Sessions, NP  spironolactone (ALDACTONE) 25 MG tablet TAKE 1 TABLET BY MOUTH ONCE DAILY 08/28/23   Bensimhon, Bevelyn Buckles, MD  SYMBICORT 80-4.5 MCG/ACT inhaler INHALE TWO (2) PUFFS BY MOUTH TWICE DAILY AS NEEDED FOR SHORTNESS OF BREATH AND/OR WHEEZING 08/14/23   Grayce Sessions, NP      Allergies    Ketoconazole    Review of Systems   Review of Systems  Unable to perform ROS: Mental status change    Physical Exam Updated Vital Signs BP 121/77 (BP Location: Left Arm)   Pulse 86   Temp 97.9 F  (36.6 C) (Oral)   Resp 18   LMP  (LMP Unknown)   SpO2 94%  Physical Exam CONSTITUTIONAL: Disheveled, ill-appearing HEAD: Normocephalic/atraumatic, no visible trauma EYES: Forcibly keeping both eyes close ENMT: Mucous membranes moist NECK: supple no meningeal signs SPINE/BACK:entire spine nontender CV: S1/S2 noted, no murmurs/rubs/gallops noted LUNGS: Lungs are clear to auscultation bilaterally, no apparent distress ABDOMEN: soft, nontender NEURO: Pt is somnolent but arousable to pain.  No facial droop.  She moves all extremities x 4 EXTREMITIES: pulses normal/equal, full ROM, no deformities SKIN: warm, color normal   ED Results / Procedures / Treatments   Labs (all labs ordered are listed, but only abnormal results are displayed) Labs Reviewed  COMPREHENSIVE METABOLIC PANEL - Abnormal; Notable for the following components:      Result Value   Glucose, Bld 138 (*)    Creatinine, Ser 1.04 (*)    Total Protein 6.3 (*)    Total Bilirubin 1.3 (*)    All other components within normal limits  CBC WITH DIFFERENTIAL/PLATELET - Abnormal; Notable for the following components:   WBC 11.0 (*)    Neutro Abs 8.2 (*)    All other components within normal limits  URINALYSIS, ROUTINE W REFLEX MICROSCOPIC - Abnormal; Notable for the following components:   Glucose, UA >=500 (*)    Bacteria, UA RARE (*)    All other components within normal limits  RAPID URINE DRUG SCREEN, HOSP PERFORMED - Abnormal; Notable for the following components:   Cocaine POSITIVE (*)    All other components within normal limits  CBG MONITORING, ED - Abnormal; Notable for the following components:   Glucose-Capillary 135 (*)    All other components within normal limits  I-STAT VENOUS BLOOD GAS, ED - Abnormal; Notable for the following components:   pO2, Ven 129 (*)    Bicarbonate 30.2 (*)    Acid-Base Excess 3.0 (*)    All other components within normal limits  AMMONIA  ETHANOL    EKG EKG  Interpretation Date/Time:  Monday October 16 2023 00:11:56 EST Ventricular Rate:  86 PR Interval:  171 QRS Duration:  93 QT Interval:  417 QTC Calculation: 499 R Axis:   -17  Text Interpretation: Sinus rhythm Left atrial enlargement Probable left ventricular hypertrophy Borderline prolonged QT interval Confirmed by Zadie Rhine ([id number]) on 10/16/2023 12:17:06 AM  Radiology CT HEAD WO CONTRAST Result Date: 10/16/2023 CLINICAL DATA:  Altered mental status. EXAM: CT HEAD WITHOUT CONTRAST TECHNIQUE: Contiguous axial images were obtained from the base of the skull through the vertex without intravenous contrast. RADIATION DOSE REDUCTION: This exam was performed according to the departmental dose-optimization program which includes automated exposure control, adjustment of the mA and/or kV according to patient size and/or use of iterative reconstruction technique. COMPARISON:  04/29/2023  FINDINGS: Brain: No acute intracranial abnormality. Specifically, no hemorrhage, hydrocephalus, mass lesion, acute infarction, or significant intracranial injury. Vascular: No hyperdense vessel or unexpected calcification. Skull: No acute calvarial abnormality. Sinuses/Orbits: No acute findings Other: None IMPRESSION: Normal study. Electronically Signed   By: Charlett Nose M.D.   On: 10/16/2023 01:14   DG Chest Port 1 View Result Date: 10/16/2023 CLINICAL DATA:  Altered mental status.  Respiratory distress. EXAM: PORTABLE CHEST 1 VIEW COMPARISON:  07/25/2023 FINDINGS: Cardiac enlargement. No vascular congestion, edema, or consolidation. No pleural effusions. No pneumothorax. Mediastinal contours appear intact. IMPRESSION: Cardiac enlargement.  No evidence of active pulmonary disease. Electronically Signed   By: Burman Nieves M.D.   On: 10/16/2023 00:39    Procedures .Critical Care  Performed by: Zadie Rhine, MD Authorized by: Zadie Rhine, MD   Critical care provider statement:    Critical care  start time:  10/16/2023 12:15 AM   Critical care end time:  10/16/2023 1:15 AM   Critical care time was exclusive of:  Separately billable procedures and treating other patients   Critical care was necessary to treat or prevent imminent or life-threatening deterioration of the following conditions:  CNS failure or compromise, respiratory failure and toxidrome   Critical care was time spent personally by me on the following activities:  Examination of patient, development of treatment plan with patient or surrogate, pulse oximetry, ordering and review of radiographic studies, ordering and review of laboratory studies, ordering and performing treatments and interventions, re-evaluation of patient's condition, review of old charts, evaluation of patient's response to treatment and obtaining history from patient or surrogate   I assumed direction of critical care for this patient from another provider in my specialty: no       Medications Ordered in ED Medications  acetaminophen (TYLENOL) tablet 650 mg (650 mg Oral Given 10/16/23 0348)    ED Course/ Medical Decision Making/ A&P Clinical Course as of 10/16/23 0405  Mon Oct 16, 2023  0017 Patient presents via EMS for altered mental status. EMS reports on their arrival she had agonal breathing and began to improved after bagging.  She reported headache and had been vomiting. Here patient is somnolent but easily arousable to pain. Imaging and labs are pending at this time. [DW]  0020 Prehospital EKG reviewed, no acute ST changes [DW]  0109 WBC(!): 11.0 Mild leukocytosis [DW]  0109 Patient is somnolent but still easily arousable. I attempted to call emergency contacts listed in demographics but no answer [DW]  0258 Workup was overall unremarkable except positive for cocaine.  Patient has known history of crack cocaine use.  She is much more alert at this time.  She is unsure why 911 was called.  She thinks she used crack about 1 hour prior to  arrival [DW]  0258 With permission, spoke to friend "Coralee North" via the phone.  She reports that patient was somnolent which is not unusual for her at nighttime after taking her nighttime medications.  Soon after she left the house, she was called back to the house as patient was more unresponsive.  No seizure activity was reported but she did have urinary incontinence. [DW]  0272 Reviewing chart reveals patient had similar episode this previous summer.  She has been negative for opiates, but suspected cocaine was laced with another substance  History tonight would certainly indicate that there were other substances involved or could be due to her home medications  Patient's mental status is much improved this time she is taken  oral fluids and she is requesting discharge home   [DW]  0405 Patient awake alert no acute distress standing up and eating and drinking.  She is requesting discharge home [DW]    Clinical Course User Index [DW] Zadie Rhine, MD                                 Medical Decision Making Amount and/or Complexity of Data Reviewed Labs: ordered. Decision-making details documented in ED Course. Radiology: ordered.  Risk OTC drugs.   This patient presents to the ED for concern of altered mental status, this involves an extensive number of treatment options, and is a complaint that carries with it a high risk of complications and morbidity.  The differential diagnosis includes but is not limited to CVA, intracranial hemorrhage, acute coronary syndrome, renal failure, urinary tract infection, electrolyte disturbance, pneumonia   Comorbidities that complicate the patient evaluation: Patient's presentation is complicated by their history of hypertension  Social Determinants of Health: Patient's  history of substance use disorder   increases the complexity of managing their presentation  Additional history obtained: Additional history obtained from EMS  Records reviewed  previous admission documents  Lab Tests: I Ordered, and personally interpreted labs.  The pertinent results include: Positive for cocaine  Imaging Studies ordered: I ordered imaging studies including CT scan head and X-ray chest   I independently visualized and interpreted imaging which showed no acute findings I agree with the radiologist interpretation  Cardiac Monitoring: The patient was maintained on a cardiac monitor.  I personally viewed and interpreted the cardiac monitor which showed an underlying rhythm of:  sinus rhythm   Critical Interventions:   monitoring in the ER   Reevaluation: After the interventions noted above, I reevaluated the patient and found that they have :improved  Complexity of problems addressed: Patient's presentation is most consistent with  acute presentation with potential threat to life or bodily function  Disposition: After consideration of the diagnostic results and the patient's response to treatment,  I feel that the patent would benefit from discharge   .           Final Clinical Impression(s) / ED Diagnoses Final diagnoses:  Transient alteration of awareness  Cocaine use disorder [facility])    Rx / DC Orders ED Discharge Orders     None         Zadie Rhine, MD 10/16/23 0405

## 2023-10-16 NOTE — Discharge Instructions (Signed)

## 2023-10-16 NOTE — Telephone Encounter (Signed)
Please reach out to pt and schedule

## 2023-10-16 NOTE — Telephone Encounter (Signed)
Copied from CRM [id number]. Topic: Appointment Scheduling - Scheduling Inquiry for Clinic >> Oct 16, 2023 12:38 PM [place] B wrote: Reason for CRM: pt called in for hosp fu/ discharged from [facility] today. Not appt until Jan 8

## 2023-10-17 ENCOUNTER — Telehealth (INDEPENDENT_AMBULATORY_CARE_PROVIDER_SITE_OTHER): Payer: Self-pay | Admitting: Primary Care

## 2023-10-17 NOTE — Telephone Encounter (Signed)
Called pt to schedule apt. Pt said aid would be in contact with [place] at [facility].

## 2023-10-17 NOTE — Telephone Encounter (Signed)
Medication Refill -  Most Recent Primary Care Visit:  Provider: Drucilla Chalet  Department: [facility]  Visit Type: TELEPHONE OFFICE VISIT  Date: 08/16/2023  Medication:  losartan (COZAAR) 25 MG tablet    Has the patient contacted their pharmacy? No  Is this the correct pharmacy for this prescription? Yes  This is the patient's preferred pharmacy: [facility] [address], [place] - [address] AT [address] [place] [address] Phone: [phone] Fax: [phone]  Has the prescription been filled recently? Yes  Is the patient out of the medication? Yes  Has the patient been seen for an appointment in the last year OR does the patient have an upcoming appointment? Yes  Can we respond through MyChart? No  Agent: Please be advised that Rx refills may take up to 3 business days. We ask that you follow-up with your pharmacy.  Medication not on current med lis. Last fill date by Marcelino Duster was 07/24/23 but discontinued on 09/19/23

## 2023-10-17 NOTE — Telephone Encounter (Signed)
Patient is requesting refill on losartan and it shows below in the medication history that it was discontinued on 09/19/23 by provider, no documentation in the chart as to why it was discontinued. It was a request also on 10/06/23, see that encounter.

## 2023-10-17 NOTE — Telephone Encounter (Signed)
Pt will need to reach out to Cardio per Marcelino Duster

## 2023-10-19 ENCOUNTER — Encounter (INDEPENDENT_AMBULATORY_CARE_PROVIDER_SITE_OTHER): Payer: Self-pay | Admitting: Primary Care

## 2023-10-19 ENCOUNTER — Ambulatory Visit (INDEPENDENT_AMBULATORY_CARE_PROVIDER_SITE_OTHER): Payer: 59 | Admitting: Primary Care

## 2023-10-19 VITALS — BP 153/93 | HR 74 | Resp 16 | Wt 165.0 lb

## 2023-10-19 DIAGNOSIS — I10 Essential (primary) hypertension: Secondary | ICD-10-CM | POA: Diagnosis not present

## 2023-10-19 DIAGNOSIS — E119 Type 2 diabetes mellitus without complications: Secondary | ICD-10-CM | POA: Diagnosis not present

## 2023-10-19 DIAGNOSIS — Z9189 Other specified personal risk factors, not elsewhere classified: Secondary | ICD-10-CM | POA: Diagnosis not present

## 2023-10-19 DIAGNOSIS — Z1231 Encounter for screening mammogram for malignant neoplasm of breast: Secondary | ICD-10-CM

## 2023-10-19 DIAGNOSIS — Z720 Tobacco use: Secondary | ICD-10-CM | POA: Diagnosis not present

## 2023-10-19 DIAGNOSIS — Z7984 Long term (current) use of oral hypoglycemic drugs: Secondary | ICD-10-CM

## 2023-10-19 NOTE — Progress Notes (Signed)
Subjective:   Stacey Lin is a 57 y.o. female presents for hospital follow up and establish care. Admit date to the hospital was 10/16/23, patient was discharged from the hospital on 10/16/23, patient was admitted for: Transient alteration of awareness and Cocaine use disorder. Today shared disappointment as usual promises she will stop explained this was up to her she can have a stroke or MI or dealth entirely her decision. Patient has No headache, No chest pain, No abdominal pain - No Nausea, No new weakness tingling or numbness, No Cough - shortness of breath    Past Medical History:  Diagnosis Date   Anemia    Arrhythmia    Asthma    CHF (congestive heart failure) (HCC)    1990s   Chronic headaches    COPD (chronic obstructive pulmonary disease) (HCC)    ??   Hypercholesteremia    Hypertension      Allergies  Allergen Reactions   Ketoconazole Rash    Current Outpatient Medications on File Prior to Visit  Medication Sig Dispense Refill   acetaminophen (TYLENOL) 500 MG tablet Take 500 mg by mouth every 6 (six) hours as needed for moderate pain or headache.     albuterol (VENTOLIN HFA) 108 (90 Base) MCG/ACT inhaler Inhale 2 puffs into the lungs every 4 (four) hours as needed for wheezing or shortness of breath. 17 g 1   allopurinol (ZYLOPRIM) 100 MG tablet TAKE ONE TABLET BY MOUTH TWICE DAILY 180 tablet 0   atorvastatin (LIPITOR) 40 MG tablet Take 1 tablet (40 mg total) by mouth daily. 90 tablet 3   carvedilol (COREG) 3.125 MG tablet TAKE 1 TABLET BY MOUTH TWICE DAILY WITH A MEAL 30 tablet 10   dapagliflozin propanediol (FARXIGA) 10 MG TABS tablet TAKE 1 TABLET BY MOUTH ONCE DAILY 15 tablet 0   furosemide (LASIX) 40 MG tablet TAKE ONE TABLET BY MOUTH TWICE DAILY * Please calls and schedule appt for further refills 30 tablet 0   gabapentin (NEURONTIN) 300 MG capsule TAKE ONE (1) CAPSULE BY MOUTH TWICE DAILY 60 capsule 10   ipratropium-albuterol (DUONEB) 0.5-2.5 (3) MG/3ML SOLN  Take 3 mLs by nebulization 2 (two) times daily. 180 mL 0   metFORMIN (GLUCOPHAGE-XR) 500 MG 24 hr tablet TAKE ONE TABLET BY MOUTH ONCE DAILY 90 tablet 5   montelukast (SINGULAIR) 10 MG tablet TAKE 1 TABLET BY MOUTH AT BEDTIME 30 tablet 10   naloxone (NARCAN) nasal spray 4 mg/0.1 mL Inject intranasal for overdose 1 each 1   QUEtiapine (SEROQUEL) 25 MG tablet Take 1 tablet (25 mg total) by mouth at bedtime. 90 tablet 1   spironolactone (ALDACTONE) 25 MG tablet TAKE 1 TABLET BY MOUTH ONCE DAILY 30 tablet 10   SYMBICORT 80-4.5 MCG/ACT inhaler INHALE TWO (2) PUFFS BY MOUTH TWICE DAILY AS NEEDED FOR SHORTNESS OF BREATH AND/OR WHEEZING 10.2 g 10   No current facility-administered medications on file prior to visit.    Review of System:  Comprehensive ROS Pertinent positive and negative noted in HPI   Objective:  BP (!) 153/93 (BP Location: Left Arm, Patient Position: Sitting, Cuff Size: Normal)   Pulse 74   Resp 16   Wt 165 lb (74.8 kg)   LMP  (LMP Unknown)   SpO2 95%   BMI 32.22 kg/m   Filed Weights   10/19/23 0903  Weight: 165 lb (74.8 kg)    Physical Exam Vitals reviewed.  HENT:     Head: Normocephalic.  Right Ear: Tympanic membrane, ear canal and external ear normal.     Left Ear: Tympanic membrane, ear canal and external ear normal.     Nose: Nose normal.     Mouth/Throat:     Mouth: Mucous membranes are moist.  Eyes:     Extraocular Movements: Extraocular movements intact.     Pupils: Pupils are equal, round, and reactive to light.  Cardiovascular:     Rate and Rhythm: Normal rate.  Pulmonary:     Effort: Pulmonary effort is normal.     Breath sounds: Normal breath sounds.  Abdominal:     General: Bowel sounds are normal.     Palpations: Abdomen is soft.  Musculoskeletal:        General: Normal range of motion.     Cervical back: Normal range of motion.  Skin:    General: Skin is warm and dry.  Neurological:     Mental Status: She is alert and oriented to  person, place, and time.  Psychiatric:        Mood and Affect: Mood normal.        Behavior: Behavior normal.        Thought Content: Thought content normal.      Assessment:  Stacey Lin was seen today for hospitalization follow-up.  Diagnoses and all orders for this visit:  Tobacco abuse -     Ambulatory Referral for Lung Cancer Screening [REF832]  COCAINE ABUSE, HX OF current 2/2 Essential hypertension, benign  See HPI BP goal - < 130/80 Explained that having normal blood pressure is the goal and medications are helping to get to goal and maintain normal blood pressure. DIET: Limit salt intake, read nutrition labels to check salt content, limit fried and high fatty foods  Avoid using multisymptom OTC cold preparations that generally contain sudafed which can rise BP. Consult with pharmacist on best cold relief products to use for persons with HTN EXERCISE Discussed incorporating exercise such as walking - 30 minutes most days of the week and can do in 10 minute intervals     Type 2 diabetes mellitus without complication, without long-term current use of insulin (HCC) -     Microalbumin / creatinine urine ratio  Encounter for screening mammogram for malignant neoplasm of breast -     Cancel: MM DIGITAL SCREENING BILATERAL; Future -     MM 3D SCREENING MAMMOGRAM BILATERAL BREAST; Future     This note has been created with Education officer, environmental. Any transcriptional errors are unintentional.   No follow-ups on file.  Grayce Sessions, NP 10/22/2023, 10:07 PM

## 2023-10-31 ENCOUNTER — Ambulatory Visit (INDEPENDENT_AMBULATORY_CARE_PROVIDER_SITE_OTHER): Payer: 59 | Admitting: Primary Care

## 2023-10-31 DIAGNOSIS — M5416 Radiculopathy, lumbar region: Secondary | ICD-10-CM | POA: Diagnosis not present

## 2023-11-01 ENCOUNTER — Other Ambulatory Visit (INDEPENDENT_AMBULATORY_CARE_PROVIDER_SITE_OTHER): Payer: Self-pay

## 2023-11-08 ENCOUNTER — Ambulatory Visit
Admission: RE | Admit: 2023-11-08 | Discharge: 2023-11-08 | Disposition: A | Discharge status: Home / Self Care | Payer: Medicaid Other | Source: Ambulatory Visit | Attending: Primary Care | Admitting: Primary Care

## 2023-11-08 DIAGNOSIS — Z1231 Encounter for screening mammogram for malignant neoplasm of breast: Secondary | ICD-10-CM

## 2023-11-21 ENCOUNTER — Other Ambulatory Visit: Payer: Self-pay

## 2023-11-21 ENCOUNTER — Inpatient Hospital Stay (HOSPITAL_COMMUNITY)
Admission: EM | Admit: 2023-11-21 | Discharge: 2023-11-26 | DRG: 189 | Disposition: A | Discharge status: Home / Self Care | Payer: 59 | Attending: Family Medicine | Admitting: Family Medicine

## 2023-11-21 ENCOUNTER — Emergency Department (HOSPITAL_COMMUNITY): Payer: 59

## 2023-11-21 ENCOUNTER — Encounter (HOSPITAL_COMMUNITY): Payer: Self-pay

## 2023-11-21 DIAGNOSIS — I081 Rheumatic disorders of both mitral and tricuspid valves: Secondary | ICD-10-CM | POA: Diagnosis present

## 2023-11-21 DIAGNOSIS — F17210 Nicotine dependence, cigarettes, uncomplicated: Secondary | ICD-10-CM | POA: Diagnosis present

## 2023-11-21 DIAGNOSIS — I110 Hypertensive heart disease with heart failure: Secondary | ICD-10-CM | POA: Diagnosis present

## 2023-11-21 DIAGNOSIS — R0902 Hypoxemia: Secondary | ICD-10-CM | POA: Diagnosis not present

## 2023-11-21 DIAGNOSIS — Z7984 Long term (current) use of oral hypoglycemic drugs: Secondary | ICD-10-CM | POA: Diagnosis not present

## 2023-11-21 DIAGNOSIS — Z825 Family history of asthma and other chronic lower respiratory diseases: Secondary | ICD-10-CM | POA: Diagnosis not present

## 2023-11-21 DIAGNOSIS — N179 Acute kidney failure, unspecified: Secondary | ICD-10-CM | POA: Diagnosis not present

## 2023-11-21 DIAGNOSIS — T380X5A Adverse effect of glucocorticoids and synthetic analogues, initial encounter: Secondary | ICD-10-CM | POA: Diagnosis present

## 2023-11-21 DIAGNOSIS — Z888 Allergy status to other drugs, medicaments and biological substances status: Secondary | ICD-10-CM | POA: Diagnosis not present

## 2023-11-21 DIAGNOSIS — Z7951 Long term (current) use of inhaled steroids: Secondary | ICD-10-CM | POA: Diagnosis not present

## 2023-11-21 DIAGNOSIS — J101 Influenza due to other identified influenza virus with other respiratory manifestations: Secondary | ICD-10-CM | POA: Diagnosis present

## 2023-11-21 DIAGNOSIS — E1165 Type 2 diabetes mellitus with hyperglycemia: Secondary | ICD-10-CM | POA: Diagnosis present

## 2023-11-21 DIAGNOSIS — Z716 Tobacco abuse counseling: Secondary | ICD-10-CM | POA: Diagnosis not present

## 2023-11-21 DIAGNOSIS — Z8249 Family history of ischemic heart disease and other diseases of the circulatory system: Secondary | ICD-10-CM

## 2023-11-21 DIAGNOSIS — J9601 Acute respiratory failure with hypoxia: Principal | ICD-10-CM | POA: Diagnosis present

## 2023-11-21 DIAGNOSIS — F32A Depression, unspecified: Secondary | ICD-10-CM | POA: Diagnosis present

## 2023-11-21 DIAGNOSIS — R0602 Shortness of breath: Secondary | ICD-10-CM | POA: Diagnosis not present

## 2023-11-21 DIAGNOSIS — I2722 Pulmonary hypertension due to left heart disease: Secondary | ICD-10-CM | POA: Diagnosis present

## 2023-11-21 DIAGNOSIS — Z743 Need for continuous supervision: Secondary | ICD-10-CM | POA: Diagnosis not present

## 2023-11-21 DIAGNOSIS — Z1152 Encounter for screening for COVID-19: Secondary | ICD-10-CM

## 2023-11-21 DIAGNOSIS — E7800 Pure hypercholesterolemia, unspecified: Secondary | ICD-10-CM | POA: Diagnosis present

## 2023-11-21 DIAGNOSIS — R0989 Other specified symptoms and signs involving the circulatory and respiratory systems: Secondary | ICD-10-CM | POA: Diagnosis not present

## 2023-11-21 DIAGNOSIS — I517 Cardiomegaly: Secondary | ICD-10-CM | POA: Diagnosis not present

## 2023-11-21 DIAGNOSIS — Z79899 Other long term (current) drug therapy: Secondary | ICD-10-CM | POA: Diagnosis not present

## 2023-11-21 DIAGNOSIS — J441 Chronic obstructive pulmonary disease with (acute) exacerbation: Secondary | ICD-10-CM | POA: Diagnosis present

## 2023-11-21 DIAGNOSIS — J9691 Respiratory failure, unspecified with hypoxia: Secondary | ICD-10-CM | POA: Diagnosis present

## 2023-11-21 DIAGNOSIS — J9600 Acute respiratory failure, unspecified whether with hypoxia or hypercapnia: Secondary | ICD-10-CM | POA: Diagnosis not present

## 2023-11-21 DIAGNOSIS — F17200 Nicotine dependence, unspecified, uncomplicated: Secondary | ICD-10-CM | POA: Diagnosis present

## 2023-11-21 DIAGNOSIS — I700 Atherosclerosis of aorta: Secondary | ICD-10-CM | POA: Diagnosis not present

## 2023-11-21 DIAGNOSIS — I5023 Acute on chronic systolic (congestive) heart failure: Secondary | ICD-10-CM | POA: Diagnosis present

## 2023-11-21 LAB — CBC WITH DIFFERENTIAL/PLATELET
Abs Immature Granulocytes: 0.05 10*3/uL (ref 0.00–0.07)
Basophils Absolute: 0 10*3/uL (ref 0.0–0.1)
Basophils Relative: 0 %
Eosinophils Absolute: 0 10*3/uL (ref 0.0–0.5)
Eosinophils Relative: 0 %
HCT: 45.9 % (ref 36.0–46.0)
Hemoglobin: 14.3 g/dL (ref 12.0–15.0)
Immature Granulocytes: 0 %
Lymphocytes Relative: 2 %
Lymphs Abs: 0.3 10*3/uL — ABNORMAL LOW (ref 0.7–4.0)
MCH: 30.2 pg (ref 26.0–34.0)
MCHC: 31.2 g/dL (ref 30.0–36.0)
MCV: 96.8 fL (ref 80.0–100.0)
Monocytes Absolute: 0.3 10*3/uL (ref 0.1–1.0)
Monocytes Relative: 2 %
Neutro Abs: 11.1 10*3/uL — ABNORMAL HIGH (ref 1.7–7.7)
Neutrophils Relative %: 96 %
Platelets: 216 10*3/uL (ref 150–400)
RBC: 4.74 MIL/uL (ref 3.87–5.11)
RDW: 13.6 % (ref 11.5–15.5)
WBC: 11.7 10*3/uL — ABNORMAL HIGH (ref 4.0–10.5)
nRBC: 0 % (ref 0.0–0.2)

## 2023-11-21 LAB — COMPREHENSIVE METABOLIC PANEL
ALT: 67 U/L — ABNORMAL HIGH (ref 0–44)
AST: 40 U/L (ref 15–41)
Albumin: 3.3 g/dL — ABNORMAL LOW (ref 3.5–5.0)
Alkaline Phosphatase: 119 U/L (ref 38–126)
Anion gap: 8 (ref 5–15)
BUN: 13 mg/dL (ref 6–20)
CO2: 22 mmol/L (ref 22–32)
Calcium: 9.1 mg/dL (ref 8.9–10.3)
Chloride: 109 mmol/L (ref 98–111)
Creatinine, Ser: 0.94 mg/dL (ref 0.44–1.00)
GFR, Estimated: >60 mL/min (ref >60)
Glucose, Bld: 164 mg/dL — ABNORMAL HIGH (ref 70–99)
Potassium: 4.4 mmol/L (ref 3.5–5.1)
Sodium: 139 mmol/L (ref 135–145)
Total Bilirubin: 1.3 mg/dL — ABNORMAL HIGH (ref 0.0–1.2)
Total Protein: 6.1 g/dL — ABNORMAL LOW (ref 6.5–8.1)

## 2023-11-21 LAB — I-STAT VENOUS BLOOD GAS, ED
Acid-base deficit: 2 mmol/L (ref 0.0–2.0)
Bicarbonate: 22.6 mmol/L (ref 20.0–28.0)
Calcium, Ion: 1.21 mmol/L (ref 1.15–1.40)
HCT: 44 % (ref 36.0–46.0)
Hemoglobin: 15 g/dL (ref 12.0–15.0)
O2 Saturation: 99 %
Potassium: 4.3 mmol/L (ref 3.5–5.1)
Sodium: 139 mmol/L (ref 135–145)
TCO2: 24 mmol/L (ref 22–32)
pCO2, Ven: 36.2 mm[Hg] — ABNORMAL LOW (ref 44–60)
pH, Ven: 7.403 (ref 7.25–7.43)
pO2, Ven: 131 mm[Hg] — ABNORMAL HIGH (ref 32–45)

## 2023-11-21 LAB — RESP PANEL BY RT-PCR (RSV, FLU A&B, COVID)  RVPGX2
Influenza A by PCR: POSITIVE — AB
Influenza B by PCR: NEGATIVE
Resp Syncytial Virus by PCR: NEGATIVE
SARS Coronavirus 2 by RT PCR: NEGATIVE

## 2023-11-21 LAB — BRAIN NATRIURETIC PEPTIDE: B Natriuretic Peptide: 1757.1 pg/mL — ABNORMAL HIGH (ref 0.0–100.0)

## 2023-11-21 LAB — HEMOGLOBIN A1C
Hgb A1c MFr Bld: 6 % — ABNORMAL HIGH (ref 4.8–5.6)
Mean Plasma Glucose: 125.5 mg/dL

## 2023-11-21 LAB — ETHANOL: Alcohol, Ethyl (B): <10 mg/dL (ref <10)

## 2023-11-21 LAB — CBG MONITORING, ED: Glucose-Capillary: 311 mg/dL — ABNORMAL HIGH (ref 70–99)

## 2023-11-21 MED ORDER — PANTOPRAZOLE SODIUM 40 MG PO TBEC
40.0000 mg | DELAYED_RELEASE_TABLET | Freq: Every day | ORAL | Status: DC
  Administered 2023-11-21 – 2023-11-26 (×6): 40 mg via ORAL
  Filled 2023-11-21 (×6): qty 1

## 2023-11-21 MED ORDER — GABAPENTIN 300 MG PO CAPS
300.0000 mg | ORAL_CAPSULE | Freq: Two times a day (BID) | ORAL | Status: DC
  Administered 2023-11-22 – 2023-11-26 (×10): 300 mg via ORAL
  Filled 2023-11-21 (×10): qty 1

## 2023-11-21 MED ORDER — METHYLPREDNISOLONE SODIUM SUCC 40 MG IJ SOLR
40.0000 mg | Freq: Two times a day (BID) | INTRAMUSCULAR | Status: AC
  Administered 2023-11-21 – 2023-11-22 (×3): 40 mg via INTRAVENOUS
  Filled 2023-11-21 (×3): qty 1

## 2023-11-21 MED ORDER — CARVEDILOL 3.125 MG PO TABS
3.1250 mg | ORAL_TABLET | Freq: Two times a day (BID) | ORAL | Status: DC
  Administered 2023-11-21 – 2023-11-26 (×10): 3.125 mg via ORAL
  Filled 2023-11-21 (×10): qty 1

## 2023-11-21 MED ORDER — ACETAMINOPHEN 325 MG PO TABS
650.0000 mg | ORAL_TABLET | Freq: Four times a day (QID) | ORAL | Status: DC | PRN
  Administered 2023-11-22 – 2023-11-25 (×3): 650 mg via ORAL
  Filled 2023-11-21 (×3): qty 2

## 2023-11-21 MED ORDER — ACETAMINOPHEN 650 MG RE SUPP: 650.0000 mg | Freq: Four times a day (QID) | RECTAL | Status: DC | PRN

## 2023-11-21 MED ORDER — HYDROCOD POLI-CHLORPHE POLI ER 10-8 MG/5ML PO SUER
5.0000 mL | Freq: Two times a day (BID) | ORAL | Status: DC | PRN
  Administered 2023-11-22 – 2023-11-26 (×8): 5 mL via ORAL
  Filled 2023-11-21 (×8): qty 5

## 2023-11-21 MED ORDER — BISACODYL 5 MG PO TBEC: 10.0000 mg | DELAYED_RELEASE_TABLET | Freq: Every day | ORAL | Status: DC | PRN

## 2023-11-21 MED ORDER — POLYETHYLENE GLYCOL 3350 17 G PO PACK
17.0000 g | PACK | Freq: Every day | ORAL | Status: DC | PRN
  Administered 2023-11-24: 17 g via ORAL
  Filled 2023-11-21: qty 1

## 2023-11-21 MED ORDER — SODIUM CHLORIDE 0.9% FLUSH: 3.0000 mL | INTRAVENOUS | Status: DC | PRN

## 2023-11-21 MED ORDER — FUROSEMIDE 10 MG/ML IJ SOLN
40.0000 mg | Freq: Two times a day (BID) | INTRAMUSCULAR | Status: DC
  Administered 2023-11-21: 40 mg via INTRAVENOUS
  Filled 2023-11-21 (×2): qty 4

## 2023-11-21 MED ORDER — OSELTAMIVIR PHOSPHATE 75 MG PO CAPS: 75.0000 mg | ORAL_CAPSULE | Freq: Two times a day (BID) | ORAL | Status: DC

## 2023-11-21 MED ORDER — MONTELUKAST SODIUM 10 MG PO TABS
10.0000 mg | ORAL_TABLET | Freq: Every day | ORAL | Status: DC
  Administered 2023-11-22 – 2023-11-25 (×5): 10 mg via ORAL
  Filled 2023-11-21 (×5): qty 1

## 2023-11-21 MED ORDER — ONDANSETRON HCL 4 MG/2ML IJ SOLN
4.0000 mg | Freq: Four times a day (QID) | INTRAMUSCULAR | Status: DC | PRN
  Filled 2023-11-21: qty 2

## 2023-11-21 MED ORDER — FLUTICASONE FUROATE-VILANTEROL 200-25 MCG/ACT IN AEPB
1.0000 | INHALATION_SPRAY | Freq: Every day | RESPIRATORY_TRACT | Status: DC
  Administered 2023-11-21 – 2023-11-26 (×4): 1 via RESPIRATORY_TRACT
  Filled 2023-11-21 (×2): qty 28

## 2023-11-21 MED ORDER — OSELTAMIVIR PHOSPHATE 30 MG PO CAPS
30.0000 mg | ORAL_CAPSULE | Freq: Two times a day (BID) | ORAL | Status: DC
  Administered 2023-11-22 – 2023-11-26 (×9): 30 mg via ORAL
  Filled 2023-11-21 (×9): qty 1

## 2023-11-21 MED ORDER — ONDANSETRON HCL 4 MG PO TABS: 4.0000 mg | ORAL_TABLET | Freq: Four times a day (QID) | ORAL | Status: DC | PRN

## 2023-11-21 MED ORDER — GUAIFENESIN ER 600 MG PO TB12
600.0000 mg | ORAL_TABLET | Freq: Two times a day (BID) | ORAL | Status: DC
  Administered 2023-11-22: 600 mg via ORAL
  Filled 2023-11-21: qty 1

## 2023-11-21 MED ORDER — ENOXAPARIN SODIUM 40 MG/0.4ML IJ SOSY
40.0000 mg | PREFILLED_SYRINGE | INTRAMUSCULAR | Status: DC
  Administered 2023-11-21 – 2023-11-25 (×5): 40 mg via SUBCUTANEOUS
  Filled 2023-11-21 (×5): qty 0.4

## 2023-11-21 MED ORDER — INSULIN ASPART 100 UNIT/ML IJ SOLN
0.0000 [IU] | Freq: Three times a day (TID) | INTRAMUSCULAR | Status: DC
  Administered 2023-11-21: 11 [IU] via SUBCUTANEOUS
  Administered 2023-11-22 (×3): 3 [IU] via SUBCUTANEOUS
  Administered 2023-11-23: 8 [IU] via SUBCUTANEOUS
  Administered 2023-11-23: 2 [IU] via SUBCUTANEOUS
  Administered 2023-11-23: 8 [IU] via SUBCUTANEOUS
  Administered 2023-11-24: 5 [IU] via SUBCUTANEOUS
  Administered 2023-11-24: 2 [IU] via SUBCUTANEOUS
  Administered 2023-11-24 – 2023-11-25 (×2): 5 [IU] via SUBCUTANEOUS
  Administered 2023-11-25: 15 [IU] via SUBCUTANEOUS

## 2023-11-21 MED ORDER — SPIRONOLACTONE 25 MG PO TABS
25.0000 mg | ORAL_TABLET | Freq: Every day | ORAL | Status: DC
  Administered 2023-11-21 – 2023-11-26 (×6): 25 mg via ORAL
  Filled 2023-11-21 (×6): qty 1

## 2023-11-21 MED ORDER — SODIUM CHLORIDE 0.9% FLUSH
3.0000 mL | Freq: Two times a day (BID) | INTRAVENOUS | Status: DC
  Administered 2023-11-22 – 2023-11-25 (×9): 3 mL via INTRAVENOUS

## 2023-11-21 MED ORDER — PREDNISONE 20 MG PO TABS
40.0000 mg | ORAL_TABLET | Freq: Every day | ORAL | Status: DC
  Administered 2023-11-23 – 2023-11-26 (×4): 40 mg via ORAL
  Filled 2023-11-21 (×4): qty 2

## 2023-11-21 MED ORDER — ALBUTEROL SULFATE (2.5 MG/3ML) 0.083% IN NEBU
5.0000 mg | INHALATION_SOLUTION | Freq: Once | RESPIRATORY_TRACT | Status: AC
  Administered 2023-11-21: 5 mg via RESPIRATORY_TRACT
  Filled 2023-11-21: qty 6

## 2023-11-21 MED ORDER — IPRATROPIUM-ALBUTEROL 0.5-2.5 (3) MG/3ML IN SOLN
3.0000 mL | Freq: Four times a day (QID) | RESPIRATORY_TRACT | Status: DC
  Administered 2023-11-21 – 2023-11-23 (×5): 3 mL via RESPIRATORY_TRACT
  Filled 2023-11-21 (×7): qty 3

## 2023-11-21 MED ORDER — ATORVASTATIN CALCIUM 40 MG PO TABS
40.0000 mg | ORAL_TABLET | Freq: Every day | ORAL | Status: DC
  Administered 2023-11-22 – 2023-11-26 (×5): 40 mg via ORAL
  Filled 2023-11-21 (×5): qty 1

## 2023-11-21 MED ORDER — FUROSEMIDE 10 MG/ML IJ SOLN
80.0000 mg | Freq: Once | INTRAMUSCULAR | Status: AC
  Administered 2023-11-21: 80 mg via INTRAVENOUS
  Filled 2023-11-21: qty 8

## 2023-11-21 MED ORDER — OSELTAMIVIR PHOSPHATE 75 MG PO CAPS
75.0000 mg | ORAL_CAPSULE | Freq: Once | ORAL | Status: AC
  Administered 2023-11-21: 75 mg via ORAL
  Filled 2023-11-21: qty 1

## 2023-11-21 MED ORDER — SODIUM CHLORIDE 0.9 % IV SOLN: 250.0000 mL | INTRAVENOUS | Status: AC | PRN

## 2023-11-21 MED ORDER — QUETIAPINE FUMARATE 25 MG PO TABS
25.0000 mg | ORAL_TABLET | Freq: Every day | ORAL | Status: DC
Start: 2023-11-21 — End: 2023-11-26
  Administered 2023-11-22 – 2023-11-25 (×5): 25 mg via ORAL
  Filled 2023-11-21 (×5): qty 1

## 2023-11-21 MED ORDER — ALLOPURINOL 100 MG PO TABS: 100.0000 mg | ORAL_TABLET | Freq: Two times a day (BID) | ORAL | Status: DC

## 2023-11-21 NOTE — ED Triage Notes (Signed)
Pt presents to ED via EMS with complaints of shortness of breath that has been going on for a week. Pt reports feeling weak and having some chest pain on deep inspiration. 82% o2 sat on EMS arrival RA. 99% RA following EMS treatments listed. Inspiratory and expiratory wheezing auscultated. Pt appears lethargic. Hx of pneumonia and reports this feels similar.

## 2023-11-21 NOTE — H&P (Signed)
[facility] History and Physical   Patient: Stacey Lin ZOX:[id number]   PCP: Grayce Sessions, NP DOB: 08-20-1966   DOA: 11/21/2023   DOS: 11/21/2023   DOS: the patient was seen and examined on 11/21/2023  Patient coming from: The patient is coming from Home  Chief Complaint: SOB  HPI: Stacey Lin is a 58 y.o. female with PMH of COPD, chronic systolic CHF, MV agitation, NIDDM T2, depression, smoker, and history of cocaine abuse in the past as reviewed from EMR, presented at [facility], ED with complaining of shortness of breath.  As per patient she is having shortness of breath for a week but it is gradually getting worse to the point that she is having some chest pain on deep inspiration.  O2 saturation 82% on room air as per EMS, improved with 2 L oxygen and bronchodilator given in the field.  Patient had significant wheezing and appeared lethargic. Currently denies any chest pain, no abdominal pain, no fever.    ED Course: VS afebrile, HR 88, RR 22, BP 141/86, 88% on room air  CMP: Mild hyperglycemia glucose 164, ALT 67, TB 1.3 BNP 1757 WBC 11.7 Influenza A positive CXR: Cardiomegaly and congestion  Patient received Lasix 80 mg IV in the ED, TRH consulted for admission and further management as below  Review of Systems: as mentioned in the history of present illness.  All other systems reviewed and are negative.  Past Medical History:  Diagnosis Date   Anemia    Arrhythmia    Asthma    CHF (congestive heart failure) (HCC)    1990s   Chronic headaches    COPD (chronic obstructive pulmonary disease) (HCC)    ??   Hypercholesteremia    Hypertension    Past Surgical History:  Procedure Laterality Date   LEFT AND RIGHT HEART CATHETERIZATION WITH CORONARY ANGIOGRAM N/A 06/06/2014   Procedure: LEFT AND RIGHT HEART CATHETERIZATION WITH CORONARY ANGIOGRAM;  Surgeon: Dolores Patty, MD;  Location: [facility] CATH LAB;  Service: Cardiovascular;  Laterality: N/A;    MULTIPLE EXTRACTIONS WITH ALVEOLOPLASTY Bilateral 07/24/2015   Procedure: MULTIPLE EXTRACTIONS WITH ALVEOLOPLASTY,  BILATERAL TORI ;  Surgeon: Ocie Doyne, DDS;  Location: [facility] OR;  Service: Oral Surgery;  Laterality: Bilateral;   RIGHT/LEFT HEART CATH AND CORONARY ANGIOGRAPHY N/A 04/11/2022   Procedure: RIGHT/LEFT HEART CATH AND CORONARY ANGIOGRAPHY;  Surgeon: Corky Crafts, MD;  Location: [facility] INVASIVE CV LAB;  Service: Cardiovascular;  Laterality: N/A;   TUBAL LIGATION  1986   Social History:  reports that she quit smoking about 4 years ago. Her smoking use included cigarettes. She started smoking about 39 years ago. She has a 35 pack-year smoking history. She has never used smokeless tobacco. She reports that she does not currently use drugs after having used the following drugs: "Crack" cocaine. Frequency: 4.00 times per week. She reports that she does not drink alcohol.  Allergies  Allergen Reactions   Ketoconazole Rash     Family history reviewed and not pertinent Family History  Problem Relation Age of Onset   Hypertension Mother    Allergies Mother    Asthma Mother    Heart disease Mother    Stomach cancer Mother        Deceased, 27   Seizures Mother    Hypertension Father        Deceased   Hypertension Maternal Grandmother    Healthy Brother    Healthy Son    Healthy Daughter  Prior to Admission medications   Medication Sig Start Date End Date Taking? Authorizing Provider  albuterol (VENTOLIN HFA) 108 (90 Base) MCG/ACT inhaler Inhale 2 puffs into the lungs every 4 (four) hours as needed for wheezing or shortness of breath. 07/22/23   Sabas Sous, MD  allopurinol (ZYLOPRIM) 100 MG tablet TAKE ONE TABLET BY MOUTH TWICE DAILY 01/02/23   Grayce Sessions, NP  atorvastatin (LIPITOR) 40 MG tablet Take 1 tablet (40 mg total) by mouth daily. 08/16/23   Hoy Register, MD  carvedilol (COREG) 3.125 MG tablet TAKE 1 TABLET BY MOUTH TWICE DAILY WITH A MEAL 08/07/23    Bensimhon, Bevelyn Buckles, MD  dapagliflozin propanediol (FARXIGA) 10 MG TABS tablet TAKE 1 TABLET BY MOUTH ONCE DAILY 09/19/23   Bensimhon, Bevelyn Buckles, MD  furosemide (LASIX) 40 MG tablet TAKE ONE TABLET BY MOUTH TWICE DAILY * Please calls and schedule appt for further refills 06/06/23   Bensimhon, Bevelyn Buckles, MD  gabapentin (NEURONTIN) 300 MG capsule TAKE ONE (1) CAPSULE BY MOUTH TWICE DAILY 09/11/23   Grayce Sessions, NP  ipratropium-albuterol (DUONEB) 0.5-2.5 (3) MG/3ML SOLN Take 3 mLs by nebulization 2 (two) times daily. 07/27/23   Morrie Sheldon, MD  metFORMIN (GLUCOPHAGE-XR) 500 MG 24 hr tablet TAKE ONE TABLET BY MOUTH ONCE DAILY 01/02/23   Grayce Sessions, NP  montelukast (SINGULAIR) 10 MG tablet TAKE 1 TABLET BY MOUTH AT BEDTIME 09/11/23   Grayce Sessions, NP  naloxone [facility]) nasal spray 4 mg/0.1 mL Inject intranasal for overdose 04/29/23   Rondel Baton, MD  QUEtiapine (SEROQUEL) 25 MG tablet Take 1 tablet (25 mg total) by mouth at bedtime. 08/03/23   Grayce Sessions, NP  spironolactone (ALDACTONE) 25 MG tablet TAKE 1 TABLET BY MOUTH ONCE DAILY 08/28/23   Bensimhon, Bevelyn Buckles, MD  SYMBICORT 80-4.5 MCG/ACT inhaler INHALE TWO (2) PUFFS BY MOUTH TWICE DAILY AS NEEDED FOR SHORTNESS OF BREATH AND/OR WHEEZING 08/14/23   Grayce Sessions, NP    Physical Exam: Vitals:   11/21/23 1400 11/21/23 1415 11/21/23 1430 11/21/23 1445  BP: 123/78 123/83 135/84 129/77  Pulse: 84 82 89 85  Resp: 17 18 18  (!) 22  Temp:    99.1 F (37.3 C)  TempSrc:    Oral  SpO2: 95% 95% 95% 95%  Weight:      Height:        General: alert and oriented to time, place, and person. Appear in mild distress, affect depressed Eyes: PERRLA, Conjunctiva normal ENT: Oral Mucosa Clear, moist  Neck: no JVD, no Abnormal Mass Or lumps Cardiovascular: S1 and S2 Present, no Murmur, peripheral pulses symmetrical Respiratory: increased respiratory effort, Bilateral Air entry equal and Decreased, no signs of accessory  muscle use, bilateral crackles and wheezes Abdomen: Bowel Sound present, Soft and no tenderness Skin: no rashes  Extremities: no Pedal edema, no calf tenderness Neurologic: without any new focal findings Gait not checked due to patient safety concerns  Data Reviewed: I have personally reviewed and interpreted labs, imaging as discussed below.  CBC: Recent Labs  Lab 11/21/23 1223 11/21/23 1230  WBC 11.7*  --   NEUTROABS 11.1*  --   HGB 14.3 15.0  HCT 45.9 44.0  MCV 96.8  --   PLT 216  --    Basic Metabolic Panel: Recent Labs  Lab 11/21/23 1223 11/21/23 1230  NA 139 139  K 4.4 4.3  CL 109  --   CO2 22  --   GLUCOSE 164*  --  BUN 13  --   CREATININE 0.94  --   CALCIUM 9.1  --    GFR: Estimated Creatinine Clearance: 56.6 mL/min (by C-G formula based on SCr of 0.94 mg/dL). Liver Function Tests: Recent Labs  Lab 11/21/23 1223  AST 40  ALT 67*  ALKPHOS 119  BILITOT 1.3*  PROT 6.1*  ALBUMIN 3.3*   No results for input(s): "LIPASE", "AMYLASE" in the last 168 hours. No results for input(s): "AMMONIA" in the last 168 hours. Coagulation Profile: No results for input(s): "INR", "PROTIME" in the last 168 hours. Cardiac Enzymes: No results for input(s): "CKTOTAL", "CKMB", "CKMBINDEX", "TROPONINI" in the last 168 hours. BNP (last 3 results) No results for input(s): "PROBNP" in the last 8760 hours. HbA1C: No results for input(s): "HGBA1C" in the last 72 hours. CBG: No results for input(s): "GLUCAP" in the last 168 hours. Lipid Profile: No results for input(s): "CHOL", "HDL", "LDLCALC", "TRIG", "CHOLHDL", "LDLDIRECT" in the last 72 hours. Thyroid Function Tests: No results for input(s): "TSH", "T4TOTAL", "FREET4", "T3FREE", "THYROIDAB" in the last 72 hours. Anemia Panel: No results for input(s): "VITAMINB12", "FOLATE", "FERRITIN", "TIBC", "IRON", "RETICCTPCT" in the last 72 hours. Urine analysis:    Component Value Date/Time   COLORURINE YELLOW 10/16/2023 0012    APPEARANCEUR CLEAR 10/16/2023 0012   LABSPEC 1.020 10/16/2023 0012   PHURINE 7.0 10/16/2023 0012   GLUCOSEU >=500 (A) 10/16/2023 0012   HGBUR NEGATIVE 10/16/2023 0012   BILIRUBINUR NEGATIVE 10/16/2023 0012   BILIRUBINUR negative 07/09/2018 1628   KETONESUR NEGATIVE 10/16/2023 0012   PROTEINUR NEGATIVE 10/16/2023 0012   UROBILINOGEN 0.2 09/17/2018 1623   UROBILINOGEN 0.2 03/14/2014 0045   NITRITE NEGATIVE 10/16/2023 0012   LEUKOCYTESUR NEGATIVE 10/16/2023 0012    Radiological Exams on Admission: DG Chest Port 1 View Result Date: 11/21/2023 CLINICAL DATA:  Shortness of breath. EXAM: PORTABLE CHEST 1 VIEW COMPARISON:  Chest radiograph dated 10/16/2023. FINDINGS: Cardiomegaly with vascular congestion. No focal consolidation, pleural effusion or pneumothorax. Atherosclerotic calcification of the aortic arch. No acute osseous pathology. IMPRESSION: Cardiomegaly with vascular congestion. Electronically Signed   By: Elgie Collard M.D.   On: 11/21/2023 12:38   EKG: Independently reviewed. normal EKG, normal sinus rhythm. Echocardiogram: 10-24 TTE LVEF 40 to 45%, grade 1 diastolic dysfunction, moderate to severe mitral valve regurgitation, severe pulmonary hypertension  I reviewed all nursing notes, pharmacy notes, vitals, pertinent old records.  Assessment/Plan Principal Problem:   Respiratory failure with hypoxia (HCC)   # Acute hypoxic respiratory failure secondary to influenza A viral infection Continue supplemental O2 relation and gradually wean off Started Tamiflu 75 mg p.o. one-time dose followed by 30 mg p.o. twice daily for 5 days renal dose as per pharmacy Continue symptomatic treatment as below   # COPD exacerbation Significant wheezing bilaterally on admission Started Solu-Medrol 40 mg IV twice daily x 3 doses followed by prednisone 40 mg p.o. daily for 5 days Mucinex 600 mg p.o. twice daily, Tussionex as needed Breo Ellipta inhaler and DuoNeb every 6 hourly   # Acute  on chronic systolic CHF exacerbation # Grade 1 diastolic dysfunction and severe pulmonary hypertension BNP elevated, chest x-ray shows cardiomegaly and congestion S/p Lasix 80 mg given in the ED Started Lasix 40 mg IV twice daily Patient was taking Lasix 40 mg at home Resumed Coreg 3.125 mg p.o. twice daily, spironolactone 25 mg p.o. daily Monitor renal functions and urine output Monitor hydration status and transition to oral Lasix.   # NIDDM T2 Held home regimen for now  Started NovoLog sliding scale Monitor CBG and continue diabetic diet Follow diabetic coordinator, patient remains at high risk of hyperglycemia due to steroids  # Nicotine dependence, patient smoke cigarettes.  Smoking cessation counseling done  Nutrition: Carb modified/heart healthy diet, fluid restriction 1.5 L/day DVT Prophylaxis: Subcutaneous Lovenox  Advance goals of care discussion: Full code   Consults: None  Family Communication: family was not present at bedside, at the time of interview.  Opportunity was given to ask question and all questions were answered satisfactorily.  Disposition: Admitted as inpatient, telemetry unit. Likely to be discharged home, in 2-3 days when stable.  I have discussed plan of care as described above with RN and patient/family.  Severity of Illness: The appropriate patient status for this patient is INPATIENT. Inpatient status is judged to be reasonable and necessary in order to provide the required intensity of service to ensure the patient's safety. The patient's presenting symptoms, physical exam findings, and initial radiographic and laboratory data in the context of their chronic comorbidities is felt to place them at high risk for further clinical deterioration. Furthermore, it is not anticipated that the patient will be medically stable for discharge from the hospital within 2 midnights of admission.   * I certify that at the point of admission it is my clinical  judgment that the patient will require inpatient hospital care spanning beyond 2 midnights from the point of admission due to high intensity of service, high risk for further deterioration and high frequency of surveillance required.*   Author: Gillis Santa, MD [facility] 11/21/2023 4:35 PM   To reach On-call, see care teams to locate the attending and reach out to them via [email or web address]. If 7PM-7AM, please contact night-coverage If you still have difficulty reaching the attending provider, please page the [facility] (Director on Call) for [facility] on amion for assistance.

## 2023-11-21 NOTE — ED Provider Notes (Signed)
[place] EMERGENCY DEPARTMENT AT [facility] Provider Note   CSN: [id number] Arrival date & time: 11/21/23  1053     History  Chief Complaint  Patient presents with   Shortness of Breath    Stacey Lin is a 58 y.o. female.  HPI Adult female arrives via EMS with concern for wheezing shortness of breath.  Patient has a history of COPD, CHF, smokes.  She notes that over the past week she has had persistent shortness of breath.  Today she was referred by her roommate for evaluation given the persistency of her symptoms.  Patient has no chest pain, abdominal pain, no fever.  EMS reports saturation 92% on 2 L via nasal cannula.    Home Medications Prior to Admission medications   Medication Sig Start Date End Date Taking? Authorizing Provider  albuterol (VENTOLIN HFA) 108 (90 Base) MCG/ACT inhaler Inhale 2 puffs into the lungs every 4 (four) hours as needed for wheezing or shortness of breath. 07/22/23   Sabas Sous, MD  allopurinol (ZYLOPRIM) 100 MG tablet TAKE ONE TABLET BY MOUTH TWICE DAILY 01/02/23   Grayce Sessions, NP  atorvastatin (LIPITOR) 40 MG tablet Take 1 tablet (40 mg total) by mouth daily. 08/16/23   Hoy Register, MD  carvedilol (COREG) 3.125 MG tablet TAKE 1 TABLET BY MOUTH TWICE DAILY WITH A MEAL 08/07/23   Bensimhon, Bevelyn Buckles, MD  dapagliflozin propanediol (FARXIGA) 10 MG TABS tablet TAKE 1 TABLET BY MOUTH ONCE DAILY 09/19/23   Bensimhon, Bevelyn Buckles, MD  furosemide (LASIX) 40 MG tablet TAKE ONE TABLET BY MOUTH TWICE DAILY * Please calls and schedule appt for further refills 06/06/23   Bensimhon, Bevelyn Buckles, MD  gabapentin (NEURONTIN) 300 MG capsule TAKE ONE (1) CAPSULE BY MOUTH TWICE DAILY 09/11/23   Grayce Sessions, NP  ipratropium-albuterol (DUONEB) 0.5-2.5 (3) MG/3ML SOLN Take 3 mLs by nebulization 2 (two) times daily. 07/27/23   Morrie Sheldon, MD  metFORMIN (GLUCOPHAGE-XR) 500 MG 24 hr tablet TAKE ONE TABLET BY MOUTH ONCE DAILY 01/02/23   Grayce Sessions, NP  montelukast (SINGULAIR) 10 MG tablet TAKE 1 TABLET BY MOUTH AT BEDTIME 09/11/23   Grayce Sessions, NP  naloxone [facility]) nasal spray 4 mg/0.1 mL Inject intranasal for overdose 04/29/23   Rondel Baton, MD  QUEtiapine (SEROQUEL) 25 MG tablet Take 1 tablet (25 mg total) by mouth at bedtime. 08/03/23   Grayce Sessions, NP  spironolactone (ALDACTONE) 25 MG tablet TAKE 1 TABLET BY MOUTH ONCE DAILY 08/28/23   Bensimhon, Bevelyn Buckles, MD  SYMBICORT 80-4.5 MCG/ACT inhaler INHALE TWO (2) PUFFS BY MOUTH TWICE DAILY AS NEEDED FOR SHORTNESS OF BREATH AND/OR WHEEZING 08/14/23   Grayce Sessions, NP      Allergies    Ketoconazole    Review of Systems   Review of Systems  Physical Exam Updated Vital Signs BP 129/77   Pulse 85   Temp 99.1 F (37.3 C) (Oral)   Resp (!) 22   Ht 5' (1.524 m)   Wt 70.8 kg   LMP  (LMP Unknown)   SpO2 95%   BMI 30.47 kg/m  Physical Exam Vitals and nursing note reviewed.  Constitutional:      Appearance: She is well-developed.  HENT:     Head: Normocephalic and atraumatic.  Eyes:     Conjunctiva/sclera: Conjunctivae normal.  Cardiovascular:     Rate and Rhythm: Normal rate and regular rhythm.  Pulmonary:     Effort: Pulmonary  effort is normal. Tachypnea present.     Breath sounds: Wheezing present.  Abdominal:     General: There is no distension.  Skin:    General: Skin is warm and dry.  Neurological:     Mental Status: She is alert and oriented to person, place, and time.     Cranial Nerves: No cranial nerve deficit.  Psychiatric:        Behavior: Behavior is withdrawn.     ED Results / Procedures / Treatments   Labs (all labs ordered are listed, but only abnormal results are displayed) Labs Reviewed  COMPREHENSIVE METABOLIC PANEL - Abnormal; Notable for the following components:      Result Value   Glucose, Bld 164 (*)    Total Protein 6.1 (*)    Albumin 3.3 (*)    ALT 67 (*)    Total Bilirubin 1.3 (*)    All other  components within normal limits  CBC WITH DIFFERENTIAL/PLATELET - Abnormal; Notable for the following components:   WBC 11.7 (*)    Neutro Abs 11.1 (*)    Lymphs Abs 0.3 (*)    All other components within normal limits  BRAIN NATRIURETIC PEPTIDE - Abnormal; Notable for the following components:   B Natriuretic Peptide 1,757.1 (*)    All other components within normal limits  I-STAT VENOUS BLOOD GAS, ED - Abnormal; Notable for the following components:   pCO2, Ven 36.2 (*)    pO2, Ven 131 (*)    All other components within normal limits  RESP PANEL BY RT-PCR (RSV, FLU A&B, COVID)  RVPGX2  ETHANOL    EKG EKG Interpretation Date/Time:  Tuesday November 21 2023 11:00:24 EST Ventricular Rate:  88 PR Interval:  156 QRS Duration:  89 QT Interval:  374 QTC Calculation: 453 R Axis:   0  Text Interpretation: Sinus rhythm Probable left atrial enlargement Anterior injury pattern No significant change since last tracing Abnormal ECG Confirmed by Gerhard Munch [phone]) on 11/21/2023 12:40:46 PM  Radiology DG Chest Port 1 View Result Date: 11/21/2023 CLINICAL DATA:  Shortness of breath. EXAM: PORTABLE CHEST 1 VIEW COMPARISON:  Chest radiograph dated 10/16/2023. FINDINGS: Cardiomegaly with vascular congestion. No focal consolidation, pleural effusion or pneumothorax. Atherosclerotic calcification of the aortic arch. No acute osseous pathology. IMPRESSION: Cardiomegaly with vascular congestion. Electronically Signed   By: Elgie Collard M.D.   On: 11/21/2023 12:38    Procedures Procedures    Medications Ordered in ED Medications  albuterol (PROVENTIL) (2.5 MG/3ML) 0.083% nebulizer solution 5 mg (5 mg Nebulization Given 11/21/23 1230)  furosemide (LASIX) injection 80 mg (80 mg Intravenous Given 11/21/23 1451)    ED Course/ Medical Decision Making/ A&P                                 Medical Decision Making Adult female with CHF, COPD now presents with dyspnea, initial hypoxia in the  field that improved with bronchodilators.  Concern for pneumonia versus multifactorial dyspnea, including CHF, COPD.  Patient is afebrile, not hypotensive, no early evidence for bacteremia, sepsis.  Labs x-ray ordered, bronchodilator started.  Amount and/or Complexity of Data Reviewed Independent Historian: EMS External Data Reviewed: notes. Labs: ordered. Decision-making details documented in ED Course. Radiology: ordered and independent interpretation performed. Decision-making details documented in ED Course. ECG/medicine tests: ordered and independent interpretation performed. Decision-making details documented in ED Course.  Risk Prescription drug management. Decision regarding hospitalization. Diagnosis or  treatment significantly limited by social determinants of health.   3:10 PM Patient calm, continues to require nasal cannula oxygen support.  Patient's labs reviewed, x-ray reviewed, EKG reviewed, no evidence for pneumonia, but patient does have evidence for likely multifactorial etiology of her dyspnea, including concern for COPD and CHF particularly. Patient has received bronchodilators, steroids en route with EMS, now IV Lasix as the patient's BNP is 1700. Patient will be admitted to our medicine colleagues for further monitoring, management, diuresis.   Final Clinical Impression(s) / ED Diagnoses Final diagnoses:  Acute respiratory failure with hypoxia (HCC)   CRITICAL CARE Performed by: Gerhard Munch Total critical care time: 35 minutes Critical care time was exclusive of separately billable procedures and treating other patients. Critical care was necessary to treat or prevent imminent or life-threatening deterioration. Critical care was time spent personally by me on the following activities: development of treatment plan with patient and/or surrogate as well as nursing, discussions with consultants, evaluation of patient's response to treatment, examination of patient,  obtaining history from patient or surrogate, ordering and performing treatments and interventions, ordering and review of laboratory studies, ordering and review of radiographic studies, pulse oximetry and re-evaluation of patient's condition.    Gerhard Munch, MD 11/21/23 [phone]

## 2023-11-22 ENCOUNTER — Other Ambulatory Visit (HOSPITAL_COMMUNITY): Payer: Self-pay

## 2023-11-22 ENCOUNTER — Telehealth (HOSPITAL_COMMUNITY): Payer: Self-pay | Admitting: Pharmacy Technician

## 2023-11-22 DIAGNOSIS — J9600 Acute respiratory failure, unspecified whether with hypoxia or hypercapnia: Secondary | ICD-10-CM

## 2023-11-22 LAB — BASIC METABOLIC PANEL
Anion gap: 11 (ref 5–15)
BUN: 21 mg/dL — ABNORMAL HIGH (ref 6–20)
CO2: 24 mmol/L (ref 22–32)
Calcium: 8.8 mg/dL — ABNORMAL LOW (ref 8.9–10.3)
Chloride: 102 mmol/L (ref 98–111)
Creatinine, Ser: 1.26 mg/dL — ABNORMAL HIGH (ref 0.44–1.00)
GFR, Estimated: 49 mL/min — ABNORMAL LOW (ref >60)
Glucose, Bld: 209 mg/dL — ABNORMAL HIGH (ref 70–99)
Potassium: 3.9 mmol/L (ref 3.5–5.1)
Sodium: 137 mmol/L (ref 135–145)

## 2023-11-22 LAB — CBC
HCT: 44.7 % (ref 36.0–46.0)
Hemoglobin: 14.3 g/dL (ref 12.0–15.0)
MCH: 30.6 pg (ref 26.0–34.0)
MCHC: 32 g/dL (ref 30.0–36.0)
MCV: 95.5 fL (ref 80.0–100.0)
Platelets: 209 10*3/uL (ref 150–400)
RBC: 4.68 MIL/uL (ref 3.87–5.11)
RDW: 13.4 % (ref 11.5–15.5)
WBC: 7.9 10*3/uL (ref 4.0–10.5)
nRBC: 0 % (ref 0.0–0.2)

## 2023-11-22 LAB — CBG MONITORING, ED
Glucose-Capillary: 157 mg/dL — ABNORMAL HIGH (ref 70–99)
Glucose-Capillary: 194 mg/dL — ABNORMAL HIGH (ref 70–99)
Glucose-Capillary: 213 mg/dL — ABNORMAL HIGH (ref 70–99)
Glucose-Capillary: 253 mg/dL — ABNORMAL HIGH (ref 70–99)

## 2023-11-22 LAB — GLUCOSE, CAPILLARY
Glucose-Capillary: 179 mg/dL — ABNORMAL HIGH (ref 70–99)
Glucose-Capillary: 215 mg/dL — ABNORMAL HIGH (ref 70–99)

## 2023-11-22 LAB — RAPID URINE DRUG SCREEN, HOSP PERFORMED
Amphetamines: NOT DETECTED
Barbiturates: NOT DETECTED
Benzodiazepines: NOT DETECTED
Cocaine: POSITIVE — AB
Opiates: POSITIVE — AB
Tetrahydrocannabinol: NOT DETECTED

## 2023-11-22 LAB — MAGNESIUM: Magnesium: 2.1 mg/dL (ref 1.7–2.4)

## 2023-11-22 LAB — PHOSPHORUS: Phosphorus: 3.4 mg/dL (ref 2.5–4.6)

## 2023-11-22 MED ORDER — INSULIN GLARGINE-YFGN 100 UNIT/ML ~~LOC~~ SOLN
5.0000 [IU] | Freq: Every day | SUBCUTANEOUS | Status: DC
  Administered 2023-11-22: 5 [IU] via SUBCUTANEOUS
  Filled 2023-11-22 (×4): qty 0.05

## 2023-11-22 MED ORDER — DM-GUAIFENESIN ER 30-600 MG PO TB12: 1.0000 | ORAL_TABLET | Freq: Two times a day (BID) | ORAL | Status: DC

## 2023-11-22 MED ORDER — NICOTINE 14 MG/24HR TD PT24
14.0000 mg | MEDICATED_PATCH | Freq: Every day | TRANSDERMAL | Status: DC
  Administered 2023-11-22: 14 mg via TRANSDERMAL
  Filled 2023-11-22 (×3): qty 1

## 2023-11-22 MED ORDER — ORAL CARE MOUTH RINSE: 15.0000 mL | OROMUCOSAL | Status: DC | PRN

## 2023-11-22 MED ORDER — FUROSEMIDE 10 MG/ML IJ SOLN: 20.0000 mg | Freq: Two times a day (BID) | INTRAMUSCULAR | Status: DC

## 2023-11-22 MED ORDER — FUROSEMIDE 10 MG/ML IJ SOLN
40.0000 mg | Freq: Every day | INTRAMUSCULAR | Status: DC
  Administered 2023-11-22 – 2023-11-23 (×2): 40 mg via INTRAVENOUS
  Filled 2023-11-22 (×2): qty 4

## 2023-11-22 MED ORDER — DM-GUAIFENESIN ER 30-600 MG PO TB12
1.0000 | ORAL_TABLET | Freq: Two times a day (BID) | ORAL | Status: AC
  Administered 2023-11-22 – 2023-11-24 (×6): 1 via ORAL
  Filled 2023-11-22 (×8): qty 1

## 2023-11-22 NOTE — TOC Initial Note (Signed)
Transition of Care [facility]) - Inpatient Brief Assessment   Patient Details  Name: Stacey Lin MRN: [id number] Date of Birth: August 12, 1966  Transition of Care [facility]) CM/SW Contact:    Michaela Corner, LCSWA Phone Number: 11/22/2023, 3:24 PM   Clinical Narrative: CSW met pt at bedside and introduced self/role. CSW addressed substance use consult with pt and provided her with resources.   TOC will continue to follow as needed.   Transition of Care Asessment: Insurance and Status: Insurance coverage has been reviewed Patient has primary care physician: Yes Home environment has been reviewed: Home Prior level of function:: Independent Prior/Current Home Services: No current home services Social Drivers of Health Review: SDOH reviewed no interventions necessary Readmission risk has been reviewed: Yes Transition of care needs: no transition of care needs at this time

## 2023-11-22 NOTE — ED Notes (Signed)
Pt vomiting after taking medications

## 2023-11-22 NOTE — Hospital Course (Addendum)
Decreased air movement.

## 2023-11-22 NOTE — Plan of Care (Signed)
Problem: Education: Goal: Ability to describe self-care measures that may prevent or decrease complications (Diabetes Survival Skills Education) will improve Outcome: Progressing   Problem: Coping: Goal: Ability to adjust to condition or change in health will improve Outcome: Progressing   Problem: Fluid Volume: Goal: Ability to maintain a balanced intake and output will improve Outcome: Progressing

## 2023-11-22 NOTE — Telephone Encounter (Signed)
Pharmacy Patient Advocate Encounter   Received notification that prior authorization for FreeStyle Libre 3 Sensoris required/requested.   Insurance verification completed.   The patient is insured through [facility] .   Per test claim: PA required; PA submitted to above mentioned insurance via CoverMyMeds Key/confirmation #/EOC [id number] Status is pending

## 2023-11-22 NOTE — Progress Notes (Signed)
Progress Note   Patient: Stacey Lin ZOX:[id number] DOB: [birth year] DOA: 11/21/2023     1 DOS: the patient was seen and examined on 11/22/2023   Brief hospital course:  Stacey Lin is a 58 y.o. female with PMH of COPD, chronic systolic HF, MV regurgitation, NIDDM type 2, depression, smoker, and history of cocaine use in the past as reviewed from EMR, who presented at [facility] ED with shortness of breath.  As per patient she's had shortness of breath for a week but it has gradually worsened to the point that she is having some chest pain on deep inspiration.     Patient had significant wheezing and appeared lethargic. O2 saturation 82% on room air as per EMS, improved with 2 L oxygen and bronchodilator given in the field.  No chest pain, no abdominal pain, no fever.      ED Course: VS afebrile, HR 88, RR 22, BP 141/86, 88% on room air  CMP: Mild hyperglycemia glucose 164, ALT 67, TB 1.3 BNP 1757 WBC 11.7 Influenza A positive CXR: Cardiomegaly and congestion   Assessment and Plan:  Acute hypoxic respiratory failure  COPD exacerbation  Influenza A + Patient with a week of progressively worsening dyspnea and cough suggestive of COPD exacerbation likely precipitated by influenza A infection. Patient is also currently requiring O2 which she normally does not. She continues to have diffuse wheezing and an oxygen requirement.  -Continue with IV steroids for one more dose, prednisone 40mg  for 5 doses.  -Continue tamiflu -Continue home inhalers _ duonebs PRN -Maintain O2 saturations >88%  Acute on Chronic systolic HF   Patient noted to have elevated proBNP and xray findings concerning for volume overload as well as worsening dyspnea. On exam she has no LE edema or ascites. No rales were appreciated.  Last TTE 07/2023 with EF 40 to 45%, LV with global hypokinesis, G1DD, RV sys fxn is nl. Estimated RVSP is 65.83mmHg. She takes farxiga for volume management. Her lasix was last  filled 05/2023.  Unclear precipitant, UDS is pending.  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   NIDDM Type 2 Hold home regimen Start basal this PM due to elevated blood glucose in setting of steroid use.   Mod severe MR Mild mod TR Mod AR Patient will need continued outpatient follow up with cardiology for this.   Nicotine dependence  Nicotine patches while inpatient.   H/o Cocaine use  TOC      Subjective: Patient states breathing is somewhat improved. She was able to rest.   Physical Exam: Vitals:   11/22/23 0600 11/22/23 0747 11/22/23 0930 11/22/23 1134  BP: (!) 134/91 117/87 (!) 127/92 115/74  Pulse: 92 96 80 83  Resp: 18 (!) 24 19 17   Temp:  97.8 F (36.6 C)  98.3 F (36.8 C)  TempSrc:  Oral    SpO2: 98% 97% 99% 97%  Weight:      Height:       Physical Exam  Constitutional: In no distress. Lying comfortably in bed.  Cardiovascular: Normal rate, regular rhythm. No lower extremity edema  Pulmonary: Non labored breathing on [place], diffuse wheezing, no  rales.   Abdominal: Soft. Normal bowel sounds. Non distended and non tender Musculoskeletal: Normal range of motion.     Neurological: Alert and oriented to person, place, and time. Non focal  Skin: Skin is warm and  dry.   Data Reviewed:  I have reviewed all labs and images.     Latest Ref Rng & Units 11/22/2023    4:13 AM 11/21/2023   12:30 PM 11/21/2023   12:23 PM  BMP  Glucose 70 - 99 mg/dL 782   956   BUN 6 - 20 mg/dL 21   13   Creatinine 2.13 - 1.00 mg/dL 0.86   5.78   Sodium 469 - 145 mmol/L 137  139  139   Potassium 3.5 - 5.1 mmol/L 3.9  4.3  4.4   Chloride 98 - 111 mmol/L 102   109   CO2 22 - 32 mmol/L 24   22   Calcium 8.9 - 10.3 mg/dL 8.8   9.1       Latest Ref Rng & Units 11/22/2023    4:13 AM 11/21/2023   12:30 PM 11/21/2023   12:23 PM  CBC  WBC 4.0 - 10.5 K/uL  7.9   11.7   Hemoglobin 12.0 - 15.0 g/dL 62.9  52.8  41.3   Hematocrit 36.0 - 46.0 % 44.7  44.0  45.9   Platelets 150 - 400 K/uL 209   216      Family Communication: Patient able to discuss with family.   Disposition: Status is: Inpatient Remains inpatient appropriate because: respiratory status.   Planned Discharge Destination: Home    Time spent: 30 minutes  Author: Marolyn Haller, MD 11/22/2023 1:06 PM  For on call review [email or web address].

## 2023-11-22 NOTE — Inpatient Diabetes Management (Signed)
Inpatient Diabetes Program Recommendations  AACE/ADA: New Consensus Statement on Inpatient Glycemic Control (2015)  Target Ranges:  Prepandial:   less than 140 mg/dL      Peak postprandial:   less than 180 mg/dL (1-2 hours)      Critically ill patients:  140 - 180 mg/dL   Lab Results  Component Value Date   GLUCAP 157 (H) 11/22/2023   HGBA1C 6.0 (H) 11/21/2023    Review of Glycemic Control  Latest Reference Range & Units 11/21/23 19:27 11/22/23 00:37 11/22/23 07:46 11/22/23 09:14 11/22/23 11:45  Glucose-Capillary 70 - 99 mg/dL 161 (H) 096 (H) 045 (H) 194 (H) 157 (H)  (H): Data is abnormally high Diabetes history: Type 2 DM Outpatient Diabetes medications: Metformin 500 mg every day, Farxiga 10 mg QD Current orders for Inpatient glycemic control: Semglee 5 units at bedtime, Novolog 0-15 units TID Solumedrol 40 mg BID  Inpatient Diabetes Program Recommendations:    Spoke with patient regarding outpatient diabetes management per MD consult for CGM. Patient and patient's caregiver are not interested in device.  Reviewed standards of care of A1C of 6% to include: frequency of monitoring, target goals, A1c goal, importance of being mindful of CHO, impact of steroids and overall risks from poor control. Discussed current glucose trends and need for good follow up. Patient is not interested in device at this time. Will sign off.   Thanks, Lujean Rave, MSN, RNC-OB Diabetes Coordinator [phone] (8a-5p)

## 2023-11-22 NOTE — Progress Notes (Signed)
Heart Failure Navigator Progress Note  Assessed for Heart & Vascular TOC clinic readiness.  Patient does not meet criteria due to Advanced Heart Failure Team patient of Dr. Gala Romney. .   Navigator will sign off at this time.   Rhae Hammock, BSN, Scientist, clinical (histocompatibility and immunogenetics) Only

## 2023-11-22 NOTE — Telephone Encounter (Signed)
Patient Product/process development scientist completed.    The patient is insured through [facility]. Patient has Medicare and is not eligible for a copay card, but may be able to apply for patient assistance or Medicare RX Payment Plan (Patient Must reach out to their plan, if eligible for payment plan), if available.    Ran test claim for Dexcom G7 Sensor and Requires Prior Authorization  Ran test claim for [employer] and Requires Prior Authorization   This test claim was processed through [employer]- copay amounts may vary at other pharmacies due to [employer], or as the patient moves through the different stages of their insurance plan.     Roland Earl, CPHT Pharmacy Technician III Certified Patient Advocate [facility] Pharm[facility] Patient Advocate Team Direct Number: [phone]  Fax: [phone]

## 2023-11-23 ENCOUNTER — Other Ambulatory Visit (HOSPITAL_COMMUNITY): Payer: Self-pay

## 2023-11-23 DIAGNOSIS — J9600 Acute respiratory failure, unspecified whether with hypoxia or hypercapnia: Secondary | ICD-10-CM | POA: Diagnosis not present

## 2023-11-23 LAB — CBC
HCT: 47.6 % — ABNORMAL HIGH (ref 36.0–46.0)
Hemoglobin: 14.9 g/dL (ref 12.0–15.0)
MCH: 30.4 pg (ref 26.0–34.0)
MCHC: 31.3 g/dL (ref 30.0–36.0)
MCV: 97.1 fL (ref 80.0–100.0)
Platelets: 244 10*3/uL (ref 150–400)
RBC: 4.9 MIL/uL (ref 3.87–5.11)
RDW: 13.5 % (ref 11.5–15.5)
WBC: 10 10*3/uL (ref 4.0–10.5)
nRBC: 0 % (ref 0.0–0.2)

## 2023-11-23 LAB — PHOSPHORUS: Phosphorus: 4 mg/dL (ref 2.5–4.6)

## 2023-11-23 LAB — GLUCOSE, CAPILLARY
Glucose-Capillary: 295 mg/dL — ABNORMAL HIGH (ref 70–99)
Glucose-Capillary: 130 mg/dL — ABNORMAL HIGH (ref 70–99)
Glucose-Capillary: 275 mg/dL — ABNORMAL HIGH (ref 70–99)
Glucose-Capillary: 374 mg/dL — ABNORMAL HIGH (ref 70–99)

## 2023-11-23 LAB — RESPIRATORY PANEL BY PCR

## 2023-11-23 LAB — BASIC METABOLIC PANEL
Anion gap: 11 (ref 5–15)
BUN: 32 mg/dL — ABNORMAL HIGH (ref 6–20)
CO2: 26 mmol/L (ref 22–32)
Calcium: 8.8 mg/dL — ABNORMAL LOW (ref 8.9–10.3)
Chloride: 102 mmol/L (ref 98–111)
Creatinine, Ser: 1.48 mg/dL — ABNORMAL HIGH (ref 0.44–1.00)
GFR, Estimated: 41 mL/min — ABNORMAL LOW (ref >60)
Glucose, Bld: 249 mg/dL — ABNORMAL HIGH (ref 70–99)
Potassium: 4.2 mmol/L (ref 3.5–5.1)
Sodium: 139 mmol/L (ref 135–145)

## 2023-11-23 LAB — MAGNESIUM: Magnesium: 2.1 mg/dL (ref 1.7–2.4)

## 2023-11-23 MED ORDER — INSULIN GLARGINE-YFGN 100 UNIT/ML ~~LOC~~ SOLN
10.0000 [IU] | Freq: Every day | SUBCUTANEOUS | Status: DC
  Administered 2023-11-23: 10 [IU] via SUBCUTANEOUS
  Filled 2023-11-23 (×2): qty 0.1

## 2023-11-23 MED ORDER — IPRATROPIUM-ALBUTEROL 0.5-2.5 (3) MG/3ML IN SOLN
3.0000 mL | Freq: Four times a day (QID) | RESPIRATORY_TRACT | Status: DC | PRN
  Administered 2023-11-24: 3 mL via RESPIRATORY_TRACT
  Filled 2023-11-23 (×3): qty 3

## 2023-11-23 NOTE — Progress Notes (Signed)
PT Cancellation Note  Patient Details Name: Stacey Lin MRN: [id number] DOB: 02-21-1966   Cancelled Treatment:    Reason Eval/Treat Not Completed: Fatigue/lethargy limiting ability to participate  Patient sleeping soundly. Tech taking BP, phone ringing, name called with only brief partial eye opening. Nurse tech reports pt has had very little sleep since coming to ED/hospital. Will reattempt as schedule permits.    Jerolyn Center, PT Acute Rehabilitation Services  Office [phone]  Zena Amos 11/23/2023, 11:10 AM

## 2023-11-23 NOTE — Plan of Care (Signed)

## 2023-11-23 NOTE — Telephone Encounter (Signed)
Pharmacy Patient Advocate Encounter  Received notification from [facility] that Prior Authorization for FreeStyle Libre 3 Sensor  has been APPROVED from 11/22/2023 to 10/23/2024. Ran test claim, Copay is $0.00. This test claim was processed through [facility]- copay amounts may vary at other pharmacies due to pharmacy/plan contracts, or as the patient moves through the different stages of their insurance plan.   PA #/Case ID/Reference #: [id number]

## 2023-11-23 NOTE — Progress Notes (Signed)
Physical Therapy Evaluation and Discharge Patient Details Name: Stacey Lin MRN: [id number] DOB: 11/03/1965 Today's Date: 11/23/2023  History of Present Illness  58 y.o. female presented 11/21/23 at [facility] ED with complaining of shortness of breath. Hypoxic respiratory failure due to flu; COPD exacerbation; acute on chronic CHF;   PMH: HFrEF LV, COPD, HLD, HTN, Gout, Depression, GERD, Anxiety, Diabetes.  Clinical Impression   Pt admitted secondary to problem above with deficits below. PTA patient was living at home with a friend who stays with her. She required assist with all ADLs and at times with walking, per pt. Pt currently requires no device and walked independently in room (limited to room due to +cough and +flu).  Patient with no further PT needs and will sign off.         If plan is discharge home, recommend the following:     Can travel by private vehicle        Equipment Recommendations None recommended by PT  Recommendations for Other Services       Functional Status Assessment Patient has not had a recent decline in their functional status     Precautions / Restrictions Precautions Precautions: Other (comment) Precaution Comments: droplet/flu      Mobility  Bed Mobility Overal bed mobility: Independent                  Transfers Overall transfer level: Independent Equipment used: None                    Ambulation/Gait Ambulation/Gait assistance: Independent Gait Distance (Feet): 60 Feet Assistive device: None Gait Pattern/deviations: WFL(Within Functional Limits)   Gait velocity interpretation: 1.31 - 2.62 ft/sec, indicative of limited community ambulator   General Gait Details: limited to in room due to coughing with +flu  Stairs            Wheelchair Mobility     Tilt Bed    Modified Rankin (Stroke Patients Only)       Balance Overall balance assessment: Independent                                            Pertinent Vitals/Pain Pain Assessment Pain Assessment: 0-10 Pain Score: 8  Pain Location: left chest with coughing Pain Descriptors / Indicators: Discomfort Pain Intervention(s): Limited activity within patient's tolerance    Home Living Family/patient expects to be discharged to:: Private residence Living Arrangements: Alone Available Help at Discharge: Friend(s);Available 24 hours/day Type of Home: House Home Access: Stairs to enter Entrance Stairs-Rails: Right Entrance Stairs-Number of Steps: 2-3   Home Layout: One level Home Equipment: Cane - single Librarian, academic (2 wheels);Shower seat;Toilet riser      Prior Function Prior Level of Function : Needs assist;History of Falls (last six months)             Mobility Comments: supervision from friend; falls due to imbalance; uses cane ADLs Comments: friend assists with "everything" bthing, dressing, meds     Extremity/Trunk Assessment   Upper Extremity Assessment Upper Extremity Assessment: Overall WFL for tasks assessed    Lower Extremity Assessment Lower Extremity Assessment: Overall WFL for tasks assessed    Cervical / Trunk Assessment Cervical / Trunk Assessment: Normal  Communication   Communication Communication: No apparent difficulties Cueing Techniques: Verbal cues  Cognition Arousal: Lethargic Behavior During Therapy: Flat affect Overall Cognitive  Status: Within Functional Limits for tasks assessed                                 General Comments: groggy, but able to answer questions about home setup that match previous medical record        General Comments General comments (skin integrity, edema, etc.): VSS per telemetry    Exercises     Assessment/Plan    PT Assessment Patient does not need any further PT services  PT Problem List         PT Treatment Interventions      PT Goals (Current goals can be found in the Care Plan section)  Acute  Rehab PT Goals PT Goal Formulation: All assessment and education complete, DC therapy    Frequency       Co-evaluation               AM-PAC PT "6 Clicks" Mobility  Outcome Measure Help needed turning from your back to your side while in a flat bed without using bedrails?: None Help needed moving from lying on your back to sitting on the side of a flat bed without using bedrails?: None Help needed moving to and from a bed to a chair (including a wheelchair)?: None Help needed standing up from a chair using your arms (e.g., wheelchair or bedside chair)?: None Help needed to walk in hospital room?: None Help needed climbing 3-5 steps with a railing? : None 6 Click Score: 24    End of Session   Activity Tolerance: Patient tolerated treatment well Patient left: in bed;with call bell/phone within reach Nurse Communication: Mobility status PT Visit Diagnosis: Difficulty in walking, not elsewhere classified (R26.2)    Time: 1914-7829 PT Time Calculation (min) (ACUTE ONLY): 14 min   Charges:   PT Evaluation $PT Eval Low Complexity: 1 Low   PT General Charges $$ ACUTE PT VISIT: 1 Visit          Stacey Lin, PT Acute Rehabilitation Services  Office [phone]   Zena Amos 11/23/2023, 3:12 PM

## 2023-11-23 NOTE — Progress Notes (Signed)
Progress Note   Patient: Stacey Lin UJW:[id number] DOB: 06-23-1966 DOA: 11/21/2023     2 DOS: the patient was seen and examined on 11/23/2023   Brief hospital course:  Stacey Lin is a 58 y.o. female with PMH of COPD, chronic systolic HF, MV regurgitation, NIDDM type 2, depression, smoker, and history of cocaine use in the past as reviewed from EMR, who presented at [facility] ED with shortness of breath.  As per patient she's had shortness of breath for a week but it has gradually worsened to the point that she is having some chest pain on deep inspiration.     Patient had significant wheezing and appeared lethargic. O2 saturation 82% on room air as per EMS, improved with 2 L oxygen and bronchodilator given in the field.  No chest pain, no abdominal pain, no fever.      ED Course: VS afebrile, HR 88, RR 22, BP 141/86, 88% on room air  CMP: Mild hyperglycemia glucose 164, ALT 67, TB 1.3 BNP 1757 WBC 11.7 Influenza A positive CXR: Cardiomegaly and congestion   Assessment and Plan:  Acute hypoxic respiratory failure  COPD exacerbation  Influenza A + Patient with a week of progressively worsening dyspnea and cough suggestive of COPD exacerbation likely precipitated by influenza A infection. Patient initially required oxygen but was on room air this morning. She still has some mild wheezing but this has also improved. She also notes subjective improvement in her breathing.  -Continue with prednsone  -Continue tamiflu -Continue home inhalers _ duonebs PRN -Maintain O2 saturations >88%  Acute on Chronic systolic HF   Patient noted to have elevated proBNP and xray findings concerning for volume overload as well as worsening dyspnea. On exam she has no LE edema or ascites. No rales were appreciated.  Last TTE 07/2023 with EF 40 to 45%, LV with global hypokinesis, G1DD, RV sys fxn is nl. Estimated RVSP is 65.58mmHg. She takes farxiga for volume management. Her lasix was last  filled 05/2023.  Likely precipitated by cocaine use.  -Will discontinue IV lasix with worsening renal function.  -Continue coreg, spironolactone  -Patient will schedule appointment with HF clinic.   AKI     Latest Ref Rng & Units 11/23/2023    2:33 AM 11/22/2023    4:13 AM 11/21/2023   12:30 PM  BMP  Glucose 70 - 99 mg/dL 562  130    BUN 6 - 20 mg/dL 32  21    Creatinine 8.65 - 1.00 mg/dL 7.84  6.96    Sodium 295 - 145 mmol/L 139  137  139   Potassium 3.5 - 5.1 mmol/L 4.2  3.9  4.3   Chloride 98 - 111 mmol/L 102  102    CO2 22 - 32 mmol/L 26  24    Calcium 8.9 - 10.3 mg/dL 8.8  8.8    Patient's BL creatinine is normal. She has had worsening in her renal function with IV diuresis. Clinically she does not appear to be volume overloaded. Will hold further IV diuresis after today and continue to monitor renal function.    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 COPD/HF  NIDDM Type 2 Hold home regimen Poorly controlled in setting of steroids.  Increase basal to 10u this PM.   Mod severe MR Mild mod TR Mod AR Patient will need continued outpatient follow up with cardiology for this.   Nicotine dependence  Nicotine patches while inpatient.  H/o Cocaine use  TOC consulted     Subjective: Breathing is marginally improved. No chest pain.   Physical Exam: Vitals:   11/23/23 0424 11/23/23 0743 11/23/23 0812 11/23/23 1107  BP: 116/84 136/87 136/87 (!) 131/93  Pulse: 72 78 63 77  Resp: (!) 21 17  11   Temp: (!) 97.5 F (36.4 C) 98.8 F (37.1 C)  (!) 97.1 F (36.2 C)  TempSrc: Oral Oral  Axillary  SpO2: 96% 97%  99%  Weight: 67.9 kg     Height:        Constitutional: In no distress.  Cardiovascular: Normal rate, regular rhythm. No lower extremity edema. No elevated JVP noted  Pulmonary: Non labored breathing on room air, no wheezing or rales.   Abdominal: Soft. Normal bowel sounds. Non distended and non  tender Musculoskeletal: Normal range of motion.     Neurological: Alert and oriented to person, place, and time. Non focal  Skin: Skin is warm and dry.     Data Reviewed:  I have reviewed all labs and images.     Latest Ref Rng & Units 11/23/2023    2:33 AM 11/22/2023    4:13 AM 11/21/2023   12:30 PM  BMP  Glucose 70 - 99 mg/dL 161  096    BUN 6 - 20 mg/dL 32  21    Creatinine 0.45 - 1.00 mg/dL 4.09  8.11    Sodium 914 - 145 mmol/L 139  137  139   Potassium 3.5 - 5.1 mmol/L 4.2  3.9  4.3   Chloride 98 - 111 mmol/L 102  102    CO2 22 - 32 mmol/L 26  24    Calcium 8.9 - 10.3 mg/dL 8.8  8.8        Latest Ref Rng & Units 11/23/2023    2:33 AM 11/22/2023    4:13 AM 11/21/2023   12:30 PM  CBC  WBC 4.0 - 10.5 K/uL 10.0  7.9    Hemoglobin 12.0 - 15.0 g/dL 78.2  95.6  21.3   Hematocrit 36.0 - 46.0 % 47.6  44.7  44.0   Platelets 150 - 400 K/uL 244  209       Disposition: Status is: Inpatient Remains inpatient appropriate because: respiratory status.   Planned Discharge Destination: Home    Time spent: 30 minutes  Author: Marolyn Haller, MD 11/23/2023 3:12 PM  For on call review [email or web address].

## 2023-11-24 DIAGNOSIS — J441 Chronic obstructive pulmonary disease with (acute) exacerbation: Secondary | ICD-10-CM

## 2023-11-24 LAB — CBC
HCT: 44.6 % (ref 36.0–46.0)
Hemoglobin: 14 g/dL (ref 12.0–15.0)
MCH: 30.1 pg (ref 26.0–34.0)
MCHC: 31.4 g/dL (ref 30.0–36.0)
MCV: 95.9 fL (ref 80.0–100.0)
Platelets: 224 10*3/uL (ref 150–400)
RBC: 4.65 MIL/uL (ref 3.87–5.11)
RDW: 13.3 % (ref 11.5–15.5)
WBC: 10.4 10*3/uL (ref 4.0–10.5)
nRBC: 0 % (ref 0.0–0.2)

## 2023-11-24 LAB — BASIC METABOLIC PANEL
Anion gap: 7 (ref 5–15)
BUN: 29 mg/dL — ABNORMAL HIGH (ref 6–20)
CO2: 28 mmol/L (ref 22–32)
Calcium: 8.5 mg/dL — ABNORMAL LOW (ref 8.9–10.3)
Chloride: 101 mmol/L (ref 98–111)
Creatinine, Ser: 0.95 mg/dL (ref 0.44–1.00)
GFR, Estimated: >60 mL/min (ref >60)
Glucose, Bld: 209 mg/dL — ABNORMAL HIGH (ref 70–99)
Potassium: 3.8 mmol/L (ref 3.5–5.1)
Sodium: 136 mmol/L (ref 135–145)

## 2023-11-24 LAB — MAGNESIUM: Magnesium: 2.1 mg/dL (ref 1.7–2.4)

## 2023-11-24 LAB — GLUCOSE, CAPILLARY
Glucose-Capillary: 121 mg/dL — ABNORMAL HIGH (ref 70–99)
Glucose-Capillary: 238 mg/dL — ABNORMAL HIGH (ref 70–99)
Glucose-Capillary: 248 mg/dL — ABNORMAL HIGH (ref 70–99)
Glucose-Capillary: 211 mg/dL — ABNORMAL HIGH (ref 70–99)

## 2023-11-24 LAB — PHOSPHORUS: Phosphorus: 3.3 mg/dL (ref 2.5–4.6)

## 2023-11-24 MED ORDER — INSULIN ASPART 100 UNIT/ML IJ SOLN
3.0000 [IU] | Freq: Three times a day (TID) | INTRAMUSCULAR | Status: DC
  Administered 2023-11-24 – 2023-11-26 (×5): 3 [IU] via SUBCUTANEOUS

## 2023-11-24 MED ORDER — LOSARTAN POTASSIUM 25 MG PO TABS
25.0000 mg | ORAL_TABLET | Freq: Every day | ORAL | Status: DC
  Administered 2023-11-24 – 2023-11-26 (×3): 25 mg via ORAL
  Filled 2023-11-24 (×3): qty 1

## 2023-11-24 MED ORDER — INSULIN GLARGINE-YFGN 100 UNIT/ML ~~LOC~~ SOLN
13.0000 [IU] | Freq: Every day | SUBCUTANEOUS | Status: DC
  Administered 2023-11-24: 13 [IU] via SUBCUTANEOUS
  Filled 2023-11-24 (×2): qty 0.13

## 2023-11-24 NOTE — Progress Notes (Addendum)
Progress Note   Patient: Stacey Lin [id number]:[id number] DOB: December 18, 1965 DOA: 11/21/2023     3 DOS: the patient was seen and examined on 11/24/2023   Brief hospital course:  Stacey Lin is a 58 y.o. female with PMH of COPD, chronic systolic HF, MV regurgitation, NIDDM type 2, depression, smoker, and history of cocaine use in the past as reviewed from EMR, who presented at [facility] ED with shortness of breath.  As per patient she's had shortness of breath for a week but it has gradually worsened to the point that she is having some chest pain on deep inspiration.     Patient had significant wheezing and appeared lethargic. O2 saturation 82% on room air as per EMS, improved with 2 L oxygen and bronchodilator given in the field.  No chest pain, no abdominal pain, no fever.      ED Course: VS afebrile, HR 88, RR 22, BP 141/86, 88% on room air  CMP: Mild hyperglycemia glucose 164, ALT 67, TB 1.3 BNP 1757 WBC 11.7 Influenza A positive CXR: Cardiomegaly and congestion   Assessment and Plan:  Acute hypoxic respiratory failure  COPD exacerbation  Influenza A + Patient with a week of progressively worsening dyspnea and cough suggestive of COPD exacerbation likely precipitated by influenza A infection. Patient remains on room air. Is having some wheezing. She feels breathing is improved.  -Continue with prednsone  -Continue tamiflu -Continue home inhalers + duonebs PRN -Maintain O2 saturations >88%  Acute on Chronic systolic HF   Patient noted to have elevated proBNP and xray findings concerning for volume overload as well as worsening dyspnea. On exam she has no LE edema or ascites. No rales were appreciated.  Last TTE 07/2023 with EF 40 to 45%, LV with global hypokinesis, G1DD, RV sys fxn is nl. Estimated RVSP is 65.44mmHg. She takes farxiga for volume management. Her lasix was last filled 05/2023.  Likely precipitated by cocaine use.  -Continue to hold IV lasix. Will add PO lasix in  the AM -Continue coreg, spironolactone, add losartan  -Patient will schedule appointment with HF clinic.   AKI stage 1 resolved     Latest Ref Rng & Units 11/24/2023    2:46 AM 11/23/2023    2:33 AM 11/22/2023    4:13 AM  BMP  Glucose 70 - 99 mg/dL 630  160  109   BUN 6 - 20 mg/dL 29  32  21   Creatinine 0.44 - 1.00 mg/dL 3.23  5.57  3.22   Sodium 135 - 145 mmol/L 136  139  137   Potassium 3.5 - 5.1 mmol/L 3.8  4.2  3.9   Chloride 98 - 111 mmol/L 101  102  102   CO2 22 - 32 mmol/L 28  26  24    Calcium 8.9 - 10.3 mg/dL 8.5  8.8  8.8   Patient's BL creatinine is normal. Initially thought AKI cardiorenal. She had worsening in her renal function with IV diuresis. Normalized this AM.  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 COPD/HF  NIDDM Type 2 Hold home regimen Poorly controlled in setting of steroids.  Increase basal to 13u this PM. + will add 3u of meal time   Mod severe MR Mild mod TR Mod AR Patient will need continued outpatient follow up with cardiology for this.   Nicotine dependence  Nicotine patches while inpatient.  H/o Cocaine use  TOC consulted     Subjective: Breathing continues to improve but did have episode of significant wheezing earlier in the AM.   Physical Exam: Vitals:   11/24/23 0552 11/24/23 0824 11/24/23 1102 11/24/23 1450  BP:  120/75 120/78 120/81  Pulse:  66 67 67  Resp:  18 18 18   Temp:  98.7 F (37.1 C) 98.1 F (36.7 C) 98.4 F (36.9 C)  TempSrc:  Oral Oral Oral  SpO2:  98% 94% 97%  Weight: 69.2 kg     Height:        Physical Exam  Constitutional: In no distress.  Cardiovascular: Normal rate, regular rhythm. No lower extremity edema  Pulmonary: Non labored breathing on room air. Expiratory wheezing primarily L side.  Abdominal: Soft. Normal bowel sounds. Non distended and non tender Musculoskeletal: Normal range  of motion.     Neurological: Alert and oriented to person, place, and time. Non focal  Skin: Skin is warm and dry.   Data Reviewed:  I have reviewed all labs and images.     Latest Ref Rng & Units 11/24/2023    2:46 AM 11/23/2023    2:33 AM 11/22/2023    4:13 AM  BMP  Glucose 70 - 99 mg/dL 098  119  147   BUN 6 - 20 mg/dL 29  32  21   Creatinine 0.44 - 1.00 mg/dL 8.29  5.62  1.30   Sodium 135 - 145 mmol/L 136  139  137   Potassium 3.5 - 5.1 mmol/L 3.8  4.2  3.9   Chloride 98 - 111 mmol/L 101  102  102   CO2 22 - 32 mmol/L 28  26  24    Calcium 8.9 - 10.3 mg/dL 8.5  8.8  8.8       Latest Ref Rng & Units 11/24/2023    2:46 AM 11/23/2023    2:33 AM 11/22/2023    4:13 AM  CBC  WBC 4.0 - 10.5 K/uL 10.4  10.0  7.9   Hemoglobin 12.0 - 15.0 g/dL 86.5  78.4  69.6   Hematocrit 36.0 - 46.0 % 44.6  47.6  44.7   Platelets 150 - 400 K/uL 224  244  209      Disposition: Status is: Inpatient Remains inpatient appropriate because: respiratory status.   Planned Discharge Destination: Home    Time spent: 30 minutes  Author: Marolyn Haller, MD 11/24/2023 2:56 PM  For on call review [email or web address].

## 2023-11-24 NOTE — Inpatient Diabetes Management (Signed)
Inpatient Diabetes Program Recommendations  AACE/ADA: New Consensus Statement on Inpatient Glycemic Control (2015)  Target Ranges:  Prepandial:   less than 140 mg/dL      Peak postprandial:   less than 180 mg/dL (1-2 hours)      Critically ill patients:  140 - 180 mg/dL   Lab Results  Component Value Date   GLUCAP 238 (H) 11/24/2023   HGBA1C 6.0 (H) 11/21/2023    Review of Glycemic Control  Latest Reference Range & Units 11/23/23 20:40 11/24/23 06:10 11/24/23 11:00  Glucose-Capillary 70 - 99 mg/dL 161 (H) 096 (H) 045 (H)   Diabetes history: DM Outpatient Diabetes medications:  Farxiga 10 mg daily Metformin XR  500 mg daily Current orders for Inpatient glycemic control:  Novolog 0-15 units tid with meals  Semglee 10 units daily Prednisone 40 mg daily  Inpatient Diabetes Program Recommendations:    While on steroids, consider adding Novolog 3 units tid with meals (hold if patient eats less than 50% or NPO).   Thanks,  Lorenza Cambridge, RN, BC-ADM Inpatient Diabetes Coordinator Pager [phone]  (8a-5p)

## 2023-11-24 NOTE — Plan of Care (Signed)
  Problem: Coping: Goal: Ability to adjust to condition or change in health will improve Outcome: Progressing   Problem: Tissue Perfusion: Goal: Adequacy of tissue perfusion will improve Outcome: Progressing   Problem: Clinical Measurements: Goal: Respiratory complications will improve Outcome: Progressing Goal: Cardiovascular complication will be avoided Outcome: Progressing

## 2023-11-24 NOTE — Progress Notes (Signed)
Per daily meeting with treatment team, looking 2-3 days before stable for dc. Per chart review, no PT follow up at this time. Substance use consult done 1/29.   TOC will continue to follow as needed.   Johnnette Gourd, MSW, LCSWA Transitions of Care [phone]

## 2023-11-24 NOTE — Progress Notes (Signed)
Mobility Specialist Progress Note:   11/24/23 1350  Mobility  Activity Ambulated with assistance in room  Level of Assistance Contact guard assist, steadying assist  Assistive Device None  Distance Ambulated (ft) 30 ft  Activity Response Tolerated fair  Mobility Referral Yes  Mobility visit 1 Mobility  Mobility Specialist Start Time (ACUTE ONLY) 1350  Mobility Specialist Stop Time (ACUTE ONLY) 1410  Mobility Specialist Time Calculation (min) (ACUTE ONLY) 20 min   Pt agreeable to mobility session, session limited by lightheadedness. SpO2 92-94% on RA, BP 117/68 (82). Required contact assist for safety d/t minor unsteadiness. Pt back in bed with all needs met.   Addison Lank Mobility Specialist Please contact via SecureChat or  Rehab office at [phone]

## 2023-11-25 DIAGNOSIS — J441 Chronic obstructive pulmonary disease with (acute) exacerbation: Secondary | ICD-10-CM | POA: Diagnosis not present

## 2023-11-25 LAB — GLUCOSE, CAPILLARY
Glucose-Capillary: 214 mg/dL — ABNORMAL HIGH (ref 70–99)
Glucose-Capillary: 119 mg/dL — ABNORMAL HIGH (ref 70–99)
Glucose-Capillary: 379 mg/dL — ABNORMAL HIGH (ref 70–99)
Glucose-Capillary: 284 mg/dL — ABNORMAL HIGH (ref 70–99)

## 2023-11-25 LAB — BASIC METABOLIC PANEL
Anion gap: 8 (ref 5–15)
BUN: 28 mg/dL — ABNORMAL HIGH (ref 6–20)
CO2: 26 mmol/L (ref 22–32)
Calcium: 8.6 mg/dL — ABNORMAL LOW (ref 8.9–10.3)
Chloride: 104 mmol/L (ref 98–111)
Creatinine, Ser: 1.11 mg/dL — ABNORMAL HIGH (ref 0.44–1.00)
GFR, Estimated: 57 mL/min — ABNORMAL LOW (ref >60)
Glucose, Bld: 267 mg/dL — ABNORMAL HIGH (ref 70–99)
Potassium: 4.4 mmol/L (ref 3.5–5.1)
Sodium: 138 mmol/L (ref 135–145)

## 2023-11-25 LAB — CBC
HCT: 45.5 % (ref 36.0–46.0)
Hemoglobin: 14.4 g/dL (ref 12.0–15.0)
MCH: 30.6 pg (ref 26.0–34.0)
MCHC: 31.6 g/dL (ref 30.0–36.0)
MCV: 96.6 fL (ref 80.0–100.0)
Platelets: 227 10*3/uL (ref 150–400)
RBC: 4.71 MIL/uL (ref 3.87–5.11)
RDW: 13.2 % (ref 11.5–15.5)
WBC: 11.4 10*3/uL — ABNORMAL HIGH (ref 4.0–10.5)
nRBC: 0 % (ref 0.0–0.2)

## 2023-11-25 MED ORDER — FUROSEMIDE 20 MG PO TABS
20.0000 mg | ORAL_TABLET | Freq: Every day | ORAL | Status: DC
Start: 2023-11-25 — End: 2023-11-26
  Administered 2023-11-25 – 2023-11-26 (×2): 20 mg via ORAL
  Filled 2023-11-25 (×2): qty 1

## 2023-11-25 MED ORDER — INSULIN GLARGINE-YFGN 100 UNIT/ML ~~LOC~~ SOLN
15.0000 [IU] | Freq: Every day | SUBCUTANEOUS | Status: DC
  Administered 2023-11-25: 15 [IU] via SUBCUTANEOUS
  Filled 2023-11-25 (×2): qty 0.15

## 2023-11-25 MED ORDER — IPRATROPIUM-ALBUTEROL 0.5-2.5 (3) MG/3ML IN SOLN
3.0000 mL | Freq: Two times a day (BID) | RESPIRATORY_TRACT | Status: DC
  Administered 2023-11-25 – 2023-11-26 (×2): 3 mL via RESPIRATORY_TRACT
  Filled 2023-11-25 (×2): qty 3

## 2023-11-25 MED ORDER — IPRATROPIUM-ALBUTEROL 0.5-2.5 (3) MG/3ML IN SOLN
3.0000 mL | RESPIRATORY_TRACT | Status: DC | PRN
  Administered 2023-11-25: 3 mL via RESPIRATORY_TRACT

## 2023-11-25 NOTE — Progress Notes (Signed)
Progress Note   Patient: Stacey Lin TFT:[id number] DOB: August 12, 1966 DOA: 11/21/2023     4 DOS: the patient was seen and examined on 11/25/2023   Brief hospital course:  Stacey Lin is a 58 y.o. female with PMH of COPD, chronic systolic HF, MV regurgitation, NIDDM type 2, depression, smoker, and history of cocaine use in the past as reviewed from EMR, who presented at [facility] ED with shortness of breath.  As per patient she's had shortness of breath for a week but it has gradually worsened to the point that she is having some chest pain on deep inspiration.     Patient had significant wheezing and appeared lethargic. O2 saturation 82% on room air as per EMS, improved with 2 L oxygen and bronchodilator given in the field.  No chest pain, no abdominal pain, no fever.      ED Course: VS afebrile, HR 88, RR 22, BP 141/86, 88% on room air  CMP: Mild hyperglycemia glucose 164, ALT 67, TB 1.3 BNP 1757 WBC 11.7 Influenza A positive CXR: Cardiomegaly and congestion   Assessment and Plan:  Acute hypoxic respiratory failure  COPD exacerbation  Influenza A + Patient with a week of progressively worsening dyspnea and cough suggestive of COPD exacerbation likely precipitated by influenza A infection. Patient remains on room air. Continues to have some wheezing. She feels breathing continues to improve  -Continue with prednsone  -Continue tamiflu -Continue home inhalers + duonebs PRN -Maintain O2 saturations >88%  Acute on Chronic systolic HF   Patient noted to have elevated proBNP and xray findings concerning for volume overload as well as worsening dyspnea. On exam she has no LE edema or ascites. No rales were appreciated.  Last TTE 07/2023 with EF 40 to 45%, LV with global hypokinesis, G1DD, RV sys fxn is nl. Estimated RVSP is 65.27mmHg. She takes farxiga for volume management. Her lasix was last filled 05/2023.  Likely precipitated by cocaine use.  -Continue PO lasix  -Continue  coreg, spironolactone, add losartan  -Patient will schedule appointment with HF clinic.   AKI stage 1 resolved     Latest Ref Rng & Units 11/25/2023    2:40 AM 11/24/2023    2:46 AM 11/23/2023    2:33 AM  BMP  Glucose 70 - 99 mg/dL 706  237  628   BUN 6 - 20 mg/dL 28  29  32   Creatinine 0.44 - 1.00 mg/dL 3.15  1.76  1.60   Sodium 135 - 145 mmol/L 138  136  139   Potassium 3.5 - 5.1 mmol/L 4.4  3.8  4.2   Chloride 98 - 111 mmol/L 104  101  102   CO2 22 - 32 mmol/L 26  28  26    Calcium 8.9 - 10.3 mg/dL 8.6  8.5  8.8   Serum creatinine mildly increased.  -Continue to monitor on PO lasix dose.    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 COPD/HF  NIDDM Type 2 Hold home regimen Poorly controlled in setting of steroids.  CBG (last 3)  Recent Labs    11/25/23 0617 11/25/23 1119 11/25/23 1646  GLUCAP 214* 119* 379*     Mod severe MR Mild mod TR Mod AR Patient will need continued outpatient follow up with cardiology for this.   Nicotine dependence  Nicotine patches while inpatient.   H/o Cocaine use  TOC consulted     Subjective: Continues to improve.  Still  having some wheezing.  Physical Exam: Vitals:   11/25/23 0858 11/25/23 1100 11/25/23 1110 11/25/23 1645  BP:  102/73 106/66 115/73  Pulse:  71  73  Resp:  18  19  Temp:  98.7 F (37.1 C)  98.8 F (37.1 C)  TempSrc:  Oral  Oral  SpO2: 99% 96%    Weight:      Height:        Physical Exam  Constitutional: In no distress.  Cardiovascular: Normal rate, regular rhythm. No lower extremity edema  Pulmonary: Non labored breathing on room air, mild wheezing Abdominal: Soft. Normal bowel sounds. Non distended and non tender Musculoskeletal: Normal range of motion.     Neurological: Alert and oriented to person, place, and time. Non focal  Skin: Skin is warm and dry.    Data Reviewed:  I have reviewed all labs and images.     Latest Ref Rng & Units  11/25/2023    2:40 AM 11/24/2023    2:46 AM 11/23/2023    2:33 AM  BMP  Glucose 70 - 99 mg/dL 161  096  045   BUN 6 - 20 mg/dL 28  29  32   Creatinine 0.44 - 1.00 mg/dL 4.09  8.11  9.14   Sodium 135 - 145 mmol/L 138  136  139   Potassium 3.5 - 5.1 mmol/L 4.4  3.8  4.2   Chloride 98 - 111 mmol/L 104  101  102   CO2 22 - 32 mmol/L 26  28  26    Calcium 8.9 - 10.3 mg/dL 8.6  8.5  8.8       Latest Ref Rng & Units 11/25/2023    2:40 AM 11/24/2023    2:46 AM 11/23/2023    2:33 AM  CBC  WBC 4.0 - 10.5 K/uL 11.4  10.4  10.0   Hemoglobin 12.0 - 15.0 g/dL 78.2  95.6  21.3   Hematocrit 36.0 - 46.0 % 45.5  44.6  47.6   Platelets 150 - 400 K/uL 227  224  244      Disposition: Status is: Inpatient Remains inpatient appropriate because: respiratory status.   Planned Discharge Destination: Home    Time spent: 30 minutes  Author: Marolyn Haller, MD 11/25/2023 5:43 PM  For on call review [email or web address].

## 2023-11-25 NOTE — Plan of Care (Signed)
  Problem: Nutrition: Goal: Adequate nutrition will be maintained Outcome: Completed/Met   Problem: Elimination: Goal: Will not experience complications related to bowel motility Outcome: Completed/Met Goal: Will not experience complications related to urinary retention Outcome: Completed/Met   Problem: Pain Managment: Goal: General experience of comfort will improve and/or be controlled Outcome: Completed/Met   Problem: Skin Integrity: Goal: Risk for impaired skin integrity will decrease Outcome: Completed/Met

## 2023-11-25 NOTE — Plan of Care (Signed)
  Problem: Coping: Goal: Ability to adjust to condition or change in health will improve Outcome: Progressing   Problem: Fluid Volume: Goal: Ability to maintain a balanced intake and output will improve Outcome: Progressing   Problem: Metabolic: Goal: Ability to maintain appropriate glucose levels will improve Outcome: Progressing   Problem: Nutritional: Goal: Maintenance of adequate nutrition will improve Outcome: Progressing   Problem: Tissue Perfusion: Goal: Adequacy of tissue perfusion will improve Outcome: Progressing   Problem: Clinical Measurements: Goal: Ability to maintain clinical measurements within normal limits will improve Outcome: Progressing   Problem: Nutrition: Goal: Adequate nutrition will be maintained Outcome: Progressing   Problem: Coping: Goal: Level of anxiety will decrease Outcome: Progressing   Problem: Elimination: Goal: Will not experience complications related to bowel motility Outcome: Progressing

## 2023-11-25 NOTE — Progress Notes (Signed)
Mobility Specialist Progress Note;   11/25/23 0950  Mobility  Activity Refused mobility   Checked on pt 2x today, first attempt this AM pt barely woke up for me yet still declining mobility at this time, second attempt this afternoon and still unable to wake up fully. Will f/u as schedule permits.   Caesar Bookman Mobility Specialist Please contact via SecureChat or [employer] [phone]

## 2023-11-26 DIAGNOSIS — J9601 Acute respiratory failure with hypoxia: Secondary | ICD-10-CM | POA: Diagnosis not present

## 2023-11-26 LAB — CBC
HCT: 45.3 % (ref 36.0–46.0)
Hemoglobin: 14.2 g/dL (ref 12.0–15.0)
MCH: 30 pg (ref 26.0–34.0)
MCHC: 31.3 g/dL (ref 30.0–36.0)
MCV: 95.6 fL (ref 80.0–100.0)
Platelets: 222 10*3/uL (ref 150–400)
RBC: 4.74 MIL/uL (ref 3.87–5.11)
RDW: 13 % (ref 11.5–15.5)
WBC: 10.6 10*3/uL — ABNORMAL HIGH (ref 4.0–10.5)
nRBC: 0 % (ref 0.0–0.2)

## 2023-11-26 LAB — BASIC METABOLIC PANEL
Anion gap: 7 (ref 5–15)
BUN: 34 mg/dL — ABNORMAL HIGH (ref 6–20)
CO2: 27 mmol/L (ref 22–32)
Calcium: 8.6 mg/dL — ABNORMAL LOW (ref 8.9–10.3)
Chloride: 104 mmol/L (ref 98–111)
Creatinine, Ser: 1.24 mg/dL — ABNORMAL HIGH (ref 0.44–1.00)
GFR, Estimated: 50 mL/min — ABNORMAL LOW (ref >60)
Glucose, Bld: 100 mg/dL — ABNORMAL HIGH (ref 70–99)
Potassium: 4.4 mmol/L (ref 3.5–5.1)
Sodium: 138 mmol/L (ref 135–145)

## 2023-11-26 LAB — GLUCOSE, CAPILLARY
Glucose-Capillary: 101 mg/dL — ABNORMAL HIGH (ref 70–99)
Glucose-Capillary: 193 mg/dL — ABNORMAL HIGH (ref 70–99)

## 2023-11-26 MED ORDER — NICOTINE 14 MG/24HR TD PT24: 14.0000 mg | MEDICATED_PATCH | TRANSDERMAL | Status: DC

## 2023-11-26 MED ORDER — FUROSEMIDE 40 MG PO TABS: 40.0000 mg | ORAL_TABLET | Freq: Every day | ORAL | 0 refills | Status: DC

## 2023-11-26 NOTE — Progress Notes (Signed)
Pt walked about 120 ft on RA,  sat was 94-95 the whole time, Thanks, Lavonda Jumbo RN

## 2023-11-26 NOTE — Discharge Summary (Signed)
Physician Discharge Summary   Patient: Stacey Lin MRN: [id number] DOB: Jun 16, 1966  Admit date:     11/21/2023  Discharge date: 11/26/23  Discharge Physician: Alberteen Sam   PCP: Grayce Sessions, NP     Recommendations at discharge:  Follow up with PCP [name] in 1 week for influenza [name]:  Please check BMP in 1 week and ensure she has resumed Lasix and Farxiga  Follow up with [facility] Clinic, appointment [facility] sent in [facility]     Discharge Diagnoses: Principal Problem:   Acute hypoxic respiratory failure due to COPD exacerbation and influenza A Active Problems:   Smoking   Influenza   COPD exacerbation   Acute on chronic systolic congestive heart failure   Diabetes   Severe pulmonary hypertension   Mitral regurgitation   Tricuspid regurgitation   History of cocaine use       Hospital Course: 58 y.o. F with COPD, CHF, mitral regurgitation, DM, smoking, cocaine use who presented with shortness of breath, found to have influenza, COPD exacerbation.     Acute respiratory failure with hypoxia COPD exacerbation Influenza Treated here with Tamiflu, steroids, bronchodilators.  Her breathing improved and she was weaned off of oxygen.   Acute on chronic systolic congestive heart failure Pulmonary hypertension Echocardiogram last October showed EF 40 to 45% with global hypokinesis pulmonary hypertension. Here she was treated with IV Lasix, -4.6 L, weaned to room air. Discharged on home Coreg, spironolactone.  Resume Lasix and Farxiga later this week.  See cardiology as soon as able, appointment requested.  Resume losartan per cardiology.            The [facility] Controlled Substances Registry was reviewed for this patient prior to discharge.  Disposition: Home health Diet recommendation:  Discharge Diet Orders (From admission, onward)     Start     Ordered   11/26/23 0000  Diet - low sodium heart healthy         11/26/23 1005   11/26/23 0000  Diet - low sodium heart healthy        11/26/23 1007             DISCHARGE MEDICATION: Allergies as of 11/26/2023       Reactions   Ketoconazole Rash        Medication List     PAUSE taking these medications    Farxiga 10 MG Tabs tablet Wait to take this until: November 28, 2023 Generic drug: dapagliflozin propanediol TAKE 1 TABLET BY MOUTH ONCE DAILY       TAKE these medications    albuterol 108 (90 Base) MCG/ACT inhaler Commonly known as: VENTOLIN HFA Inhale 2 puffs into the lungs every 4 (four) hours as needed for wheezing or shortness of breath.   atorvastatin 40 MG tablet Commonly known as: LIPITOR Take 1 tablet (40 mg total) by mouth daily.   carvedilol 3.125 MG tablet Commonly known as: COREG TAKE 1 TABLET BY MOUTH TWICE DAILY WITH A MEAL   escitalopram 20 MG tablet Commonly known as: LEXAPRO Take 20 mg by mouth daily.   furosemide 40 MG tablet Commonly known as: LASIX Take 1 tablet (40 mg total) by mouth daily. What changed: See the new instructions.   gabapentin 300 MG capsule Commonly known as: NEURONTIN TAKE ONE (1) CAPSULE BY MOUTH TWICE DAILY   ipratropium-albuterol 0.5-2.5 (3) MG/3ML Soln Commonly known as: DUONEB Take 3 mLs by nebulization 2 (two) times daily.   metFORMIN 500 MG 24  hr tablet Commonly known as: GLUCOPHAGE-XR TAKE ONE TABLET BY MOUTH ONCE DAILY   montelukast 10 MG tablet Commonly known as: SINGULAIR TAKE 1 TABLET BY MOUTH AT BEDTIME   naloxone 4 MG/0.1ML Liqd nasal spray kit Commonly known as: NARCAN Inject intranasal for overdose   nicotine 14 mg/24hr patch Commonly known as: NICODERM CQ - dosed in mg/24 hours Place 1 patch (14 mg total) onto the skin daily.   QUEtiapine 25 MG tablet Commonly known as: SEROquel Take 1 tablet (25 mg total) by mouth at bedtime. What changed:  when to take this reasons to take this   spironolactone 25 MG tablet Commonly known as:  ALDACTONE TAKE 1 TABLET BY MOUTH ONCE DAILY   Symbicort 80-4.5 MCG/ACT inhaler Generic drug: budesonide-formoterol INHALE TWO (2) PUFFS BY MOUTH TWICE DAILY AS NEEDED FOR SHORTNESS OF BREATH AND/OR WHEEZING        Follow-up Information     Grayce Sessions, NP. Schedule an appointment as soon as possible for a visit in 1 week(s).   Specialty: Internal Medicine Contact information: [address] [place] [address] [phone]                 Discharge Instructions     Diet - low sodium heart healthy   Complete by: As directed    Diet - low sodium heart healthy   Complete by: As directed    Discharge instructions   Complete by: As directed    **IMPORTANT DISCHARGE INSTRUCTIONS**   From Dr. [name]: You were admitted for the flu  Here, you were treated with Tamiflu and completed the 5 day course  You should   Discharge instructions   Complete by: As directed    **IMPORTANT DISCHARGE INSTRUCTIONS**   From Dr. [name]: You were admitted for the Flu  You were treated with the anti-virus medicine Tamiflu and completed the course  You should stop smoking  Take nicotine patches as directed on the box to help you stop  Go see [name] WITHIN ONE WEEK  Make sure you get labs drawn by the end of this week (by Thursday)  For the next two days, HOLD (do not take) your Marcelline Deist and your Lasix  Then resume Lasix and Farxiga on Wednesday  Get labs checked on Thursday and go see [name] as soon as possible   Increase activity slowly   Complete by: As directed    Increase activity slowly   Complete by: As directed        Discharge Exam: Filed Weights   11/24/23 0552 11/25/23 0535 11/26/23 0523  Weight: 69.2 kg 70.4 kg 71 kg    General: Pt is alert, awake, not in acute distress Cardiovascular: RRR, nl S1-S2, no murmurs appreciated.   No LE edema.   Respiratory: Normal respiratory rate and rhythm.  CTAB without rales or  wheezes. Abdominal: Abdomen soft and non-tender.  No distension or HSM.   Neuro/Psych: Strength symmetric in upper and lower extremities.  Judgment and insight appear normal.   Condition at discharge: fair  The results of significant diagnostics from this hospitalization (including imaging, microbiology, ancillary and laboratory) are listed below for reference.   Imaging Studies: DG Chest Port 1 View Result Date: 11/21/2023 CLINICAL DATA:  Shortness of breath. EXAM: PORTABLE CHEST 1 VIEW COMPARISON:  Chest radiograph dated 10/16/2023. FINDINGS: Cardiomegaly with vascular congestion. No focal consolidation, pleural effusion or pneumothorax. Atherosclerotic calcification of the aortic arch. No acute osseous pathology. IMPRESSION: Cardiomegaly with vascular congestion.  Electronically Signed   By: Elgie Collard M.D.   On: 11/21/2023 12:38    Microbiology: Results for orders placed or performed during the hospital encounter of 11/21/23  Resp panel by RT-PCR (RSV, Flu A&B, Covid) Anterior Nasal Swab     Status: Abnormal   Collection Time: 11/21/23 12:23 PM   Specimen: Anterior Nasal Swab  Result Value Ref Range Status   SARS Coronavirus 2 by RT PCR NEGATIVE NEGATIVE Final   Influenza A by PCR POSITIVE (A) NEGATIVE Final   Influenza B by PCR NEGATIVE NEGATIVE Final    Comment: (NOTE) The Xpert Xpress SARS-CoV-2/FLU/RSV plus assay is intended as an aid in the diagnosis of influenza from Nasopharyngeal swab specimens and should not be used as a sole basis for treatment. Nasal washings and aspirates are unacceptable for Xpert Xpress SARS-CoV-2/FLU/RSV testing.  Fact Sheet for Patients: BloggerCourse.com  Fact Sheet for Healthcare Providers: SeriousBroker.it  This test is not yet approved or cleared by the [place] FDA and has been authorized for detection and/or diagnosis of SARS-CoV-2 by FDA under an Emergency Use Authorization  (EUA). This EUA will remain in effect (meaning this test can be used) for the duration of the COVID-19 declaration under Section 564(b)(1) of the Act, 21 U.S.C. section 360bbb-3(b)(1), unless the authorization is terminated or revoked.     Resp Syncytial Virus by PCR NEGATIVE NEGATIVE Final    Comment: (NOTE) Fact Sheet for Patients: BloggerCourse.com  Fact Sheet for Healthcare Providers: SeriousBroker.it  This test is not yet approved or cleared by the [place] FDA and has been authorized for detection and/or diagnosis of SARS-CoV-2 by FDA under an Emergency Use Authorization (EUA). This EUA will remain in effect (meaning this test can be used) for the duration of the COVID-19 declaration under Section 564(b)(1) of the Act, 21 U.S.C. section 360bbb-3(b)(1), unless the authorization is terminated or revoked.  Performed at [facility], [address], [place], [place] [address]   Respiratory (~20 pathogens) panel by PCR     Status: Abnormal   Collection Time: 11/23/23 12:46 PM   Specimen: Nasopharyngeal Swab; Respiratory  Result Value Ref Range Status   Adenovirus NOT DETECTED NOT DETECTED Final   Coronavirus 229E NOT DETECTED NOT DETECTED Final    Comment: (NOTE) The Coronavirus on the Respiratory Panel, DOES NOT test for the novel  Coronavirus (2019 nCoV)    Coronavirus HKU1 NOT DETECTED NOT DETECTED Final   Coronavirus NL63 NOT DETECTED NOT DETECTED Final   Coronavirus OC43 NOT DETECTED NOT DETECTED Final   Metapneumovirus NOT DETECTED NOT DETECTED Final   Rhinovirus / Enterovirus NOT DETECTED NOT DETECTED Final   Influenza A H3 DETECTED (A) NOT DETECTED Final   Influenza B NOT DETECTED NOT DETECTED Final   Parainfluenza Virus 1 NOT DETECTED NOT DETECTED Final   Parainfluenza Virus 2 NOT DETECTED NOT DETECTED Final   Parainfluenza Virus 3 NOT DETECTED NOT DETECTED Final   Parainfluenza Virus 4 NOT DETECTED  NOT DETECTED Final   Respiratory Syncytial Virus NOT DETECTED NOT DETECTED Final   Bordetella pertussis NOT DETECTED NOT DETECTED Final   Bordetella Parapertussis NOT DETECTED NOT DETECTED Final   Chlamydophila pneumoniae NOT DETECTED NOT DETECTED Final   Mycoplasma pneumoniae NOT DETECTED NOT DETECTED Final    Comment: Performed at [facility], [address], [place], [place] [address]    Labs: CBC: Recent Labs  Lab 11/21/23 1223 11/21/23 1230 11/22/23 0413 11/23/23 8295 11/24/23 0246 11/25/23 0240 11/26/23 0246  WBC 11.7*  --  7.9 10.0 10.4 11.4* 10.6*  NEUTROABS 11.1*  --   --   --   --   --   --   HGB 14.3   < > 14.3 14.9 14.0 14.4 14.2  HCT 45.9   < > 44.7 47.6* 44.6 45.5 45.3  MCV 96.8  --  95.5 97.1 95.9 96.6 95.6  PLT 216  --  209 244 224 227 222   < > = values in this interval not displayed.   Basic Metabolic Panel: Recent Labs  Lab 11/22/23 0413 11/23/23 0233 11/24/23 0246 11/25/23 0240 11/26/23 0246  NA 137 139 136 138 138  K 3.9 4.2 3.8 4.4 4.4  CL 102 102 101 104 104  CO2 24 26 28 26 27   GLUCOSE 209* 249* 209* 267* 100*  BUN 21* 32* 29* 28* 34*  CREATININE 1.26* 1.48* 0.95 1.11* 1.24*  CALCIUM 8.8* 8.8* 8.5* 8.6* 8.6*  MG 2.1 2.1 2.1  --   --   PHOS 3.4 4.0 3.3  --   --    Liver Function Tests: Recent Labs  Lab 11/21/23 1223  AST 40  ALT 67*  ALKPHOS 119  BILITOT 1.3*  PROT 6.1*  ALBUMIN 3.3*   CBG: Recent Labs  Lab 11/25/23 1119 11/25/23 1646 11/25/23 2024 11/26/23 0524 11/26/23 1106  GLUCAP 119* 379* 284* 101* 193*    Discharge time spent: approximately 45 minutes spent on discharge counseling, evaluation of patient on day of discharge, and coordination of discharge planning with nursing, social work, pharmacy and case management  Signed: Alberteen Sam, MD [facility] 11/26/2023

## 2023-11-27 ENCOUNTER — Telehealth: Payer: Self-pay

## 2023-11-27 ENCOUNTER — Telehealth (HOSPITAL_COMMUNITY): Payer: Self-pay | Admitting: Family Medicine

## 2023-11-27 ENCOUNTER — Telehealth: Payer: Self-pay | Admitting: *Deleted

## 2023-11-27 NOTE — Transitions of Care (Post Inpatient/ED Visit) (Signed)
   11/27/2023  Name: Stacey Lin MRN: [id number] DOB: 1965/11/09  Today's TOC FU Call Status: Today's TOC FU Call Status:: Unsuccessful Call (1st Attempt) Unsuccessful Call (1st Attempt) Date: 11/27/23  Attempted to reach the patient regarding the most recent Inpatient/ED visit.  Follow Up Plan: Additional outreach attempts will be made to reach the patient to complete the Transitions of Care (Post Inpatient/ED visit) call.  Gean Maidens BSN RN [facility] Health Care Management Coordinator [email or web address] Direct Dial: [phone]  Fax: [phone] Website: [email or web address]

## 2023-11-27 NOTE — Transitions of Care (Post Inpatient/ED Visit) (Signed)
   11/27/2023  Name: Stacey Lin MRN: [id number] DOB: [birth year]  Today's TOC FU Call Status: Today's TOC FU Call Status:: Unsuccessful Call (1st Attempt) Unsuccessful Call (1st Attempt) Date: 11/27/23  Attempted to reach the patient regarding the most recent Inpatient/ED visit.  Follow Up Plan: Additional outreach attempts will be made to reach the patient to complete the Transitions of Care (Post Inpatient/ED visit) call.   Signature  Robyne Peers, RN

## 2023-11-28 ENCOUNTER — Telehealth: Payer: Self-pay

## 2023-11-28 NOTE — Transitions of Care (Post Inpatient/ED Visit) (Signed)
   11/28/2023  Name: Stacey Lin MRN: [id number] DOB: 02-15-1966  Today's TOC FU Call Status: Today's TOC FU Call Status:: Unsuccessful Call (2nd Attempt) Unsuccessful Call (1st Attempt) Date: 11/27/23 Unsuccessful Call (2nd Attempt) Date: 11/28/23  Attempted to reach the patient regarding the most recent Inpatient/ED visit.  Follow Up Plan: Additional outreach attempts will be made to reach the patient to complete the Transitions of Care (Post Inpatient/ED visit) call.   Signature  Slater Diesel, RN

## 2023-11-29 ENCOUNTER — Telehealth: Payer: Self-pay

## 2023-11-29 NOTE — Transitions of Care (Post Inpatient/ED Visit) (Signed)
   11/29/2023  Name: Stacey Lin MRN: [id number] DOB: [birth year]  Today's TOC FU Call Status: Today's TOC FU Call Status:: Unsuccessful Call (3rd Attempt) Unsuccessful Call (1st Attempt) Date: 11/27/23 Unsuccessful Call (2nd Attempt) Date: 11/28/23 Unsuccessful Call (3rd Attempt) Date: 11/29/23  Attempted to reach the patient regarding the most recent Inpatient/ED visit.  Follow Up Plan: No further outreach attempts will be made at this time. We have been unable to contact the patient.  Letter sent to the patient requesting she call [facility] to schedule a follow up appointment as we have not been able to reach her.   Signature Slater Diesel, RN

## 2023-11-29 NOTE — Transitions of Care (Post Inpatient/ED Visit) (Signed)
   11/29/2023  Name: Stacey Lin MRN: [id number] DOB: 11-26-1965  Today's TOC FU Call Status: Today's TOC FU Call Status:: Unsuccessful Call (2nd Attempt) Unsuccessful Call (2nd Attempt) Date: 11/29/23  Attempted to reach the patient regarding the most recent Inpatient/ED visit.  Follow Up Plan: Additional outreach attempts will be made to reach the patient to complete the Transitions of Care (Post Inpatient/ED visit) call.   Pasco Lunger BSN, Programmer, Systems   Transitions of Care  [facility] Direct Dial Number: [phone]  Fax: [phone]

## 2023-11-30 ENCOUNTER — Telehealth: Payer: Self-pay

## 2023-11-30 NOTE — Transitions of Care (Post Inpatient/ED Visit) (Signed)
   11/30/2023  Name: Stacey Lin MRN: [id number] DOB: [birth year]  Today's TOC FU Call Status: Unsuccessful Call (3rd Attempt) Date: 11/30/23  Attempted to reach the patient regarding the most recent Inpatient/ED visit. Patient was called in an Outreach attempt to offer VBCI  30-day TOC program. Pt is eligible for program due to potential risk for readmission and/or high utilization. Unfortunately, TOC RN CM was not able to speak with the patient in regards to recent hospital discharge.   Practice TOC RN also had 3 unsuccessful outreach attempt post discharge outreach.    Patient's voicemail had a generic greeting. To maintain HIPAA compliance, left message with VBCI CM contact information only and a request for a call back.  No PCP scheduled follow up appointment noted in EMR. DC instructions were to obtain lab work today 11/30/23. No other Population Health RN CM visits noted.   Follow Up Plan: No further outreach attempts will be made at this time. We have been unable to contact the patient.   Bing Edison MSN, RN RN Case Sales [facility]  VBCI-Population Health Office Hours Wed/Thur  8:00 am-6:00 pm Direct Dial: [phone] Main Phone [phone]  Fax: [phone] [email or web address]

## 2023-12-07 ENCOUNTER — Other Ambulatory Visit (INDEPENDENT_AMBULATORY_CARE_PROVIDER_SITE_OTHER): Payer: Self-pay | Admitting: Primary Care

## 2023-12-07 NOTE — Telephone Encounter (Unsigned)
Copied from CRM [id number]. Topic: Clinical - Medication Refill >> Dec 07, 2023 10:56 AM Payton Doughty wrote: Most Recent Primary Care Visit:  Provider: Grayce Sessions  [facility]  Visit Type: OFFICE VISIT  Date: 10/19/2023  Medication: icotine (NICODERM CQ - DOSED IN MG/24 HOURS) 14 mg/24hr patch  Has the patient contacted their pharmacy? No (Agent: If no, request that the patient contact the pharmacy for the refill. If patient does not wish to contact the pharmacy document the reason why and proceed with request.) (Agent: If yes, when and what did the pharmacy advise?)  Is this the correct pharmacy for this prescription? Yes If no, delete pharmacy and type the correct one.  This is the patient's preferred pharmacy:  [facility] [address], [place] - [address] [place] [address] Phone: [phone] Fax: [phone]  Has the prescription been filled recently? No  Is the patient out of the medication? Yes  Has the patient been seen for an appointment in the last year OR does the patient have an upcoming appointment? Yes  Can we respond through MyChart? Yes  Agent: Please be advised that Rx refills may take up to 3 business days. We ask that you follow-up with your pharmacy.

## 2023-12-07 NOTE — Telephone Encounter (Unsigned)
Copied from CRM [id number]. Topic: Clinical - Medication Refill >> Dec 07, 2023 10:48 AM Stacey Lin wrote: Most Recent Primary Care Visit:  Provider: Grayce Sessions  [facility]  Visit Type: OFFICE VISIT  Date: 10/19/2023  Medication: losartan (COZAAR) tablet 25 mg  Has the patient contacted their pharmacy? Yes Pt states they told her there are no refills.  Pt wants to know if she is still supposed to be taking this medication.  Is this the correct pharmacy for this prescription? Yes If no, delete pharmacy and type the correct one.  This is the patient's preferred pharmacy:  [facility] [address], [place] - [address] [place] [address] Phone: [phone] Fax: [phone]   Has the prescription been filled recently? No  Is the patient out of the medication? Yes  Has the patient been seen for an appointment in the last year OR does the patient have an upcoming appointment? Yes  Can we respond through MyChart? Yes  Agent: Please be advised that Rx refills may take up to 3 business days. We ask that you follow-up with your pharmacy.

## 2023-12-08 NOTE — Telephone Encounter (Signed)
Unable to refill per protocol, Rx expired. Discontinued 09/19/23.  Requested Prescriptions  Pending Prescriptions Disp Refills   losartan (COZAAR) 25 MG tablet [Pharmacy Med Name: LOSARTAN 25MG  TABLETS] 15 tablet 0    Sig: TAKE 1 TABLET(25 MG) BY MOUTH DAILY     Cardiovascular:  Angiotensin Receptor Blockers Failed - 12/08/2023  9:29 AM      Failed - Cr in normal range and within 180 days    Creat  Date Value Ref Range Status  09/16/2015 1.49 (H) 0.50 - 1.05 mg/dL Final   Creatinine, Ser  Date Value Ref Range Status  11/26/2023 1.24 (H) 0.44 - 1.00 mg/dL Final   Creatinine,U  Date Value Ref Range Status  08/05/2009 115.4 mg/dL Final    Comment:    See lab report for associated comment(s)         Passed - K in normal range and within 180 days    Potassium  Date Value Ref Range Status  11/26/2023 4.4 3.5 - 5.1 mmol/L Final         Passed - Patient is not pregnant      Passed - Last BP in normal range    BP Readings from Last 1 Encounters:  11/26/23 133/85         Passed - Valid encounter within last 6 months    Recent Outpatient Visits           1 month ago Tobacco abuse   [place] [facility] Grayce Sessions, NP   3 months ago Controlled type 2 diabetes mellitus with hyperglycemia, without long-term current use of insulin (HCC)   [facility] [place] Drucilla Chalet, RPH-CPP   4 months ago Hospital discharge follow-up   [place] [facility] Grayce Sessions, NP   1 year ago Vaginal itching   [place] [facility] Grayce Sessions, NP   1 year ago Pruritus   [place] [facility] Grayce Sessions, NP       Future Appointments             In 2 months Randa Evens, Kinnie Scales, NP [place] [facility]

## 2023-12-19 ENCOUNTER — Other Ambulatory Visit (INDEPENDENT_AMBULATORY_CARE_PROVIDER_SITE_OTHER): Payer: Self-pay | Admitting: Primary Care

## 2023-12-19 NOTE — Telephone Encounter (Signed)
 Copied from CRM [id number]. Topic: Clinical - Medication Refill >> Dec 19, 2023 12:59 PM Patsy Lager T wrote: Most Recent Primary Care Visit:  Provider: Grayce Sessions  [facility]  Visit Type: OFFICE VISIT  Date: 10/19/2023  Medication: albuterol (VENTOLIN HFA) 108 (90 Base) MCG/ACT inhaler  Has the patient contacted their pharmacy? No  Is this the correct pharmacy for this prescription? Yes .  This is the patient's preferred pharmacy:  [facility] [address] [place] - [address] [place] [address] Phone: [phone] Fax: [phone]  Has the prescription been filled recently? Yes  Is the patient out of the medication? Yes  Has the patient been seen for an appointment in the last year OR does the patient have an upcoming appointment? Yes  Can we respond through MyChart? Yes  Agent: Please be advised that Rx refills may take up to 3 business days. We ask that you follow-up with your pharmacy.

## 2023-12-20 ENCOUNTER — Telehealth (HOSPITAL_COMMUNITY): Payer: Self-pay

## 2023-12-20 NOTE — Telephone Encounter (Signed)
 Requested medication (s) are due for refill today: yes  Requested medication (s) are on the active medication list: yes  Last refill:  07/22/23  Future visit scheduled: yes  Notes to clinic:  Unable to refill per protocol, last refill by another provider.      Requested Prescriptions  Pending Prescriptions Disp Refills   albuterol (VENTOLIN HFA) 108 (90 Base) MCG/ACT inhaler 17 g 1    Sig: Inhale 2 puffs into the lungs every 4 (four) hours as needed for wheezing or shortness of breath.     Pulmonology:  Beta Agonists 2 Passed - 12/20/2023 12:26 PM      Passed - Last BP in normal range    BP Readings from Last 1 Encounters:  11/26/23 133/85         Passed - Last Heart Rate in normal range    Pulse Readings from Last 1 Encounters:  11/26/23 72         Passed - Valid encounter within last 12 months    Recent Outpatient Visits           2 months ago Tobacco abuse   [place] [facility] Grayce Sessions, NP   4 months ago Controlled type 2 diabetes mellitus with hyperglycemia, without long-term current use of insulin (HCC)   [facility] [place] Drucilla Chalet, RPH-CPP   4 months ago Hospital discharge follow-up   [place] [facility] Grayce Sessions, NP   1 year ago Vaginal itching   [place] [facility] Grayce Sessions, NP   1 year ago Pruritus   [place] [facility] Grayce Sessions, NP       Future Appointments             In 2 months Randa Evens, Kinnie Scales, NP [place] [facility]

## 2023-12-20 NOTE — Telephone Encounter (Signed)
 Called to confirm/remind patient of their appointment [facility] on [facility]. However, was unable to leave patient a voice message.

## 2023-12-21 ENCOUNTER — Ambulatory Visit (HOSPITAL_COMMUNITY)
Admission: RE | Admit: 2023-12-21 | Discharge: 2023-12-21 | Disposition: A | Discharge status: Home / Self Care | Payer: 59 | Source: Ambulatory Visit | Attending: Physician Assistant | Admitting: Physician Assistant

## 2023-12-21 ENCOUNTER — Encounter (HOSPITAL_COMMUNITY): Payer: Self-pay

## 2023-12-21 VITALS — BP 130/80 | HR 71 | Wt 150.8 lb

## 2023-12-21 DIAGNOSIS — Z8249 Family history of ischemic heart disease and other diseases of the circulatory system: Secondary | ICD-10-CM | POA: Diagnosis not present

## 2023-12-21 DIAGNOSIS — I10 Essential (primary) hypertension: Secondary | ICD-10-CM | POA: Diagnosis not present

## 2023-12-21 DIAGNOSIS — Z720 Tobacco use: Secondary | ICD-10-CM

## 2023-12-21 DIAGNOSIS — J449 Chronic obstructive pulmonary disease, unspecified: Secondary | ICD-10-CM | POA: Diagnosis not present

## 2023-12-21 DIAGNOSIS — I38 Endocarditis, valve unspecified: Secondary | ICD-10-CM

## 2023-12-21 DIAGNOSIS — Z79899 Other long term (current) drug therapy: Secondary | ICD-10-CM | POA: Insufficient documentation

## 2023-12-21 DIAGNOSIS — I110 Hypertensive heart disease with heart failure: Secondary | ICD-10-CM | POA: Insufficient documentation

## 2023-12-21 DIAGNOSIS — F1910 Other psychoactive substance abuse, uncomplicated: Secondary | ICD-10-CM | POA: Diagnosis not present

## 2023-12-21 DIAGNOSIS — I083 Combined rheumatic disorders of mitral, aortic and tricuspid valves: Secondary | ICD-10-CM | POA: Insufficient documentation

## 2023-12-21 DIAGNOSIS — I428 Other cardiomyopathies: Secondary | ICD-10-CM | POA: Diagnosis not present

## 2023-12-21 DIAGNOSIS — F32A Depression, unspecified: Secondary | ICD-10-CM | POA: Diagnosis not present

## 2023-12-21 DIAGNOSIS — Z7984 Long term (current) use of oral hypoglycemic drugs: Secondary | ICD-10-CM | POA: Diagnosis not present

## 2023-12-21 DIAGNOSIS — I5022 Chronic systolic (congestive) heart failure: Secondary | ICD-10-CM | POA: Diagnosis not present

## 2023-12-21 LAB — BASIC METABOLIC PANEL
Anion gap: 9 (ref 5–15)
BUN: 19 mg/dL (ref 6–20)
CO2: 27 mmol/L (ref 22–32)
Calcium: 9.7 mg/dL (ref 8.9–10.3)
Chloride: 103 mmol/L (ref 98–111)
Creatinine, Ser: 1.13 mg/dL — ABNORMAL HIGH (ref 0.44–1.00)
GFR, Estimated: 56 mL/min — ABNORMAL LOW (ref >60)
Glucose, Bld: 133 mg/dL — ABNORMAL HIGH (ref 70–99)
Potassium: 4 mmol/L (ref 3.5–5.1)
Sodium: 139 mmol/L (ref 135–145)

## 2023-12-21 LAB — BRAIN NATRIURETIC PEPTIDE: B Natriuretic Peptide: 409.2 pg/mL — ABNORMAL HIGH (ref 0.0–100.0)

## 2023-12-21 MED ORDER — LOSARTAN POTASSIUM 25 MG PO TABS: 25.0000 mg | ORAL_TABLET | Freq: Every day | ORAL | 3 refills | Status: DC

## 2023-12-21 NOTE — Patient Instructions (Addendum)
 Medication Changes:  START: LOSARTAN 25MG  ONCE DAILY   TAKE AN EXTRA 40MG  OF FUROSEMIDE FOR THE NEXT 2 DAYS (FOR A TOTAL OF 80MG  DAILY) 2 TABLETS THEN RESUME NORMAL DOSING  Lab Work:  Labs done today, your results will be available in MyChart, we will contact you for abnormal readings.  THEN AGAIN IN 1 WEEK AS SCHEDULED   Follow-Up in: 4 WEEKS AS SCHEDULED   At the [facility], you and your health needs are our priority. We have a designated team specialized in the treatment of Heart Failure. This Care Team includes your primary Heart Failure Specialized Cardiologist (physician), Advanced Practice Providers (APPs- Physician Assistants and Nurse Practitioners), and Pharmacist who all work together to provide you with the care you need, when you need it.   You may see any of the following providers on your designated Care Team at your next follow up:  Dr. Arvilla Meres Dr. [name] Dr. Dorthula Nettles Dr. Theresia Bough Tonye Becket, NP Robbie Lis, [place] [facility] [place], [place] Brynda Peon, NP [place] Lee, NP Karle Plumber, PharmD   Please be sure to bring in all your medications bottles to every appointment.   Need to Contact us:  If you have any questions or concerns before your next appointment please send [place] a message through [place] or call our office at [phone].    TO LEAVE A MESSAGE FOR THE NURSE SELECT OPTION 2, PLEASE LEAVE A MESSAGE INCLUDING: YOUR NAME DATE OF BIRTH CALL BACK NUMBER REASON FOR CALL**this is important as we prioritize the call backs  YOU WILL RECEIVE A CALL BACK THE SAME DAY AS LONG AS YOU CALL BEFORE 4:00 P

## 2023-12-21 NOTE — Progress Notes (Signed)
 Heart and Vascular Care Navigation  12/21/2023  Stacey Lin 11-18-1965 [id number]  Reason for Referral: SDOH concerns, substance abuse, housing/utility expenses   Engaged with patient face to face for initial visit for Heart and Vascular Care Coordination.                                                                                                   Assessment:  CSW met with pt and pt roommate Stacey Lin who helps her out with things around the house.  Patient reports she lives in a single family home with her brother, Stacey Lin, and several other roommates (total of 8?).  Only source of income for household is herself ($1013/month) and her brother (also on disability unsure of amount). Pt gets about $312 from [employer] and $200 from food stamps.   Rent is $650 and currently behind about $2,400.  States eviction proceedings have begun but they appealed and now they are paying the court directly until next court date.  Patient also behind on utilities- last months bill was almost $800 with disconnect notice set for 3/12.  CSW provided pt and Stacey Lin with information for [place] rent/utility assistance to inquire about potential assistance with past due rent.  For utilities pt reports she applied for LIEAP but hasn't heard anything- encouraged her to bring them disconnect notice so that they would prioritize her case.   Patient is interested in having some of the roommates move out as they do not contribute the household or respect her belongings.   Turn the heat up and make utility bills high as well as eat all her food and don't attempt to help around the house.   Pt struggles with kicking them out as they have no place to go.  CSW provided with [facility] legal line to discuss how she could evict them from property as at least one has already refused to leave on her own.    Admits to current food insecurity at home.  Provided with Heart and Vascular food bag and list of local pantries.  Provided  with referral to [facility].  CSW started to discuss current substance use- admits to continued crack cocaine abuse but was unable to get into details on this as she had to call insurance transportation.     Will follow up with patient on this matter at later time.                           HRT/VAS Care Coordination     Patients Home Cardiology Office [facility] Failure Clinic   Outpatient Care Team Social Worker   Social Worker Name: Naaman Plummer, [phone]   Living arrangements for the past 2 months Single Family Home   Lives with: Siblings; Roommate   Patient Current Insurance Coverage Managed Medicare; Medicaid   Patient Has Concern With Paying Medical Bills No   Does Patient Have Prescription Coverage? Yes   Home Assistive Devices/Equipment Nebulizer   DME Agency [employer]   [facility] Agency NA   Current home services  DME  walker, cane,       Social History:                                                                             SDOH Screenings   Food Insecurity: Food Insecurity Present (12/21/2023)  Housing: High Risk (12/21/2023)  Transportation Needs: Unmet Transportation Needs (12/21/2023)  Utilities: At Risk (12/21/2023)  Depression (PHQ2-9): High Risk (10/19/2023)  Financial Resource Strain: High Risk (12/21/2023)  Physical Activity: Unknown (10/18/2023)  Recent Concern: Physical Activity - Inactive (08/02/2023)  Social Connections: Moderately Isolated (11/22/2023)  Stress: Stress Concern Present (10/18/2023)  Tobacco Use: Medium Risk (12/21/2023)    SDOH Interventions: Financial Resources:  Surveyor, quantity Strain Interventions: Programmer, applications Provided  Food Insecurity:  Food Insecurity Interventions: [employer] Provided, Other (Comment) (Heart and Vascular Food bag)  Housing Insecurity:  Housing Interventions: Programmer, applications Provided  Transportation:    Patient utilizing insurance transportation   Follow-up plan:    CSW will continue to  follow pt in clinic.  Will plan to follow up with patient  by phone to check in on referrals provided today.  Burna Sis, LCSW Clinical Social Worker [facility] Desk#: [phone] Cell#: [phone]

## 2023-12-21 NOTE — Progress Notes (Addendum)
 Patient ID: Stacey Lin, female   DOB: [birth year], 58 y.o.   MRN: [id number]    [facility] Note   PCP: Dr Lily Kocher  Pulmonologist: Dr. Shelle Iron HF: Dr. Gala Romney   HPI: 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  EF as low as 30-35% in 2014.   cMRI 08/27/13: EF 32% RV mildly dilated, no scar or infiltrative disease. Mod MR and Mod-sev TR  R/LHC (06/06/14): ectatic coronaries (suspect d/t HTN) no significant arthrosclerosis and normal EF. No significant MR noted  Echo (05/2014): EF 55-60%, mild/mod AI, grade I DD and mild MR  EF stable at 55-60% in 2019.   She has not been seen in HF clinic since 2021. She has no-showed multiple appointments in our office.   She was admitted in 06/23 with acute respiratory failure with hypoxia 2/2 acute on chronic systolic CHF. EF down to 30-35% on echo with normal RV, severe BAE, moderate to severe MR, moderate to severe TR and moderate to severe AI. Diuresed and GDMT titrated. R/LHC with no CAD and reduced Fick CI of 1.96 L/min/m2.  Readmit 10/24 with COPD exacerbation in setting of noncompliance with medications and ongoing tobacco use + recent cocaine use. Repeat echo with EF 40-45%.  Admitted 01/25 with acute respiratory failure 2/2 COPD exacerbation and influenza A. She also had acute on chronic CHF and diuresed with IV lasix.  Here today for overdue CHF follow-up. She is accompanied by her friend/caregiver who assists with providing the history. She continues to struggle with chronic shortness of breath. No lower extremity edema. For the most part taking her medications, occasionally misses a dose.   Has been struggling with a lot of depression. Uses crack routinely. Patient has several people living with her  who would otherwise be homeless. She has a court hearing  for housing coming up soon and may be evicted from her home. Has medicaid and gets disability check very month.   FH: Mother deceased: Dementia, seizures, stomach cancer        Father deceased: HTN  Review of systems complete and found to be negative unless listed in HPI.    Past Medical History:  Diagnosis Date   Anemia    Arrhythmia    Asthma    CHF (congestive heart failure) (HCC)    1990s   Chronic headaches    COPD (chronic obstructive pulmonary disease) (HCC)    ??   Hypercholesteremia    Hypertension     Current Outpatient Medications  Medication Sig Dispense Refill   atorvastatin (LIPITOR) 40 MG tablet Take 1 tablet (40 mg total) by mouth daily. 90 tablet 3   carvedilol (COREG) 3.125 MG tablet TAKE 1 TABLET BY MOUTH TWICE DAILY WITH A MEAL 30 tablet 10   dapagliflozin propanediol (FARXIGA) 10 MG TABS tablet TAKE 1 TABLET BY MOUTH ONCE DAILY 15 tablet 0   escitalopram (LEXAPRO) 20 MG tablet Take 20 mg by mouth daily.     furosemide (LASIX) 40 MG tablet Take 1 tablet (40 mg total) by mouth daily. 30 tablet 0   gabapentin (NEURONTIN) 300 MG capsule TAKE ONE (1) CAPSULE BY MOUTH TWICE DAILY 60 capsule 10   ipratropium-albuterol (DUONEB) 0.5-2.5 (3) MG/3ML SOLN Take 3 mLs by nebulization 2 (two) times daily. 180 mL 0   losartan (COZAAR) 25 MG tablet Take 1 tablet (25 mg  total) by mouth daily. 90 tablet 3   metFORMIN (GLUCOPHAGE-XR) 500 MG 24 hr tablet TAKE ONE TABLET BY MOUTH ONCE DAILY 90 tablet 5   montelukast (SINGULAIR) 10 MG tablet TAKE 1 TABLET BY MOUTH AT BEDTIME 30 tablet 10   naloxone (NARCAN) nasal spray 4 mg/0.1 mL Inject intranasal for overdose (Patient taking differently: Inject intranasal for overdose as needed) 1 each 1   QUEtiapine (SEROQUEL) 25 MG tablet Take 1 tablet (25 mg total) by mouth at bedtime. (Patient taking differently: Take 25 mg by mouth at bedtime as needed.) 90 tablet 1   spironolactone (ALDACTONE) 25 MG tablet TAKE 1 TABLET BY MOUTH ONCE DAILY 30  tablet 10   SYMBICORT 80-4.5 MCG/ACT inhaler INHALE TWO (2) PUFFS BY MOUTH TWICE DAILY AS NEEDED FOR SHORTNESS OF BREATH AND/OR WHEEZING 10.2 g 10   nicotine (NICODERM CQ - DOSED IN MG/24 HOURS) 14 mg/24hr patch Place 1 patch (14 mg total) onto the skin daily. (Patient not taking: Reported on 12/21/2023)     No current facility-administered medications for this encounter.   Vitals:   12/21/23 1352  BP: 130/80  Pulse: 71  SpO2: 96%  Weight: 68.4 kg (150 lb 12.8 oz)    Wt Readings from Last 3 Encounters:  12/21/23 68.4 kg (150 lb 12.8 oz)  11/26/23 71 kg (156 lb 8.4 oz)  10/19/23 74.8 kg (165 lb)     PHYSICAL EXAM: General:  Chronically ill appearing. No distress. Neck: Jvp 8-9.  Cor: Regular rate & rhythm. No rubs, gallops or murmurs. Lungs: clear Abdomen: soft, nontender, nondistended.  Neuro: alert & orientedx3. Affect pleasant  ECG NSR 73 bpm  ASSESSMENT & PLAN:  1) Chronic systolic HF:   - Nonischemic cardiomyopathy.  - EF 30-35% in 2014 - Cath 8/15 showed normal cors  - Echo 6/19 EF 55-65%  - Echo 06/23: EF down to 30-35% on echo with normal RV, severe BAE, moderate to severe MR, moderate to severe TR and moderate to severe AI - Echo 10/24: EF 40-45%, RV okay, PASP 65 mmHg, moderate to severe MR, mild to moderate TR, moderate AI - NYHA III. Appears volume up on exam and by ReDS which is 37%. Takes 40 mg lasix daily. Instructed her to take an extra 40 mg for the next 2 days. - Start losartan 25 mg daily - Continue spiro 25 mg daily - Continue Farxiga 10 mg daily - Continue Coreg 3.125 mg BID - Suspect drop in EF d/t combination of crack/cocaine use and noncompliance. She would be good for Paramedicine but is not in [facility] network. Her caregiver is helping her with medications.  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 Reports drug use helps her cope with depression.  - HF CSW to see today to provide resources. Also recommended f/u with PCP regarding depression.  5) Tobacco Abuse -Continues to smoke. Cessation advised  SDOH: Has medicaid. Receives disability check to pay for housing. May be evicted from house in next few months. Having trouble paying power pill. No food insecurity.  - Discussed with HF CSW - will see to complete full  SDOH screen  Follow-up 4 weeks with APP  Sulamita Lafountain N PA-C 2:36 PM

## 2023-12-21 NOTE — Progress Notes (Signed)
 ReDS Vest / Clip - 12/21/23 1500       ReDS Vest / Clip   Station Marker A    Ruler Value 26    ReDS Value Range Moderate volume overload    ReDS Actual Value 37

## 2023-12-24 MED ORDER — ALBUTEROL SULFATE HFA 108 (90 BASE) MCG/ACT IN AERS: 2.0000 | INHALATION_SPRAY | RESPIRATORY_TRACT | 1 refills | Status: DC | PRN

## 2023-12-28 ENCOUNTER — Other Ambulatory Visit (HOSPITAL_COMMUNITY): Payer: Self-pay

## 2023-12-28 ENCOUNTER — Ambulatory Visit (HOSPITAL_COMMUNITY)
Admission: RE | Admit: 2023-12-28 | Discharge: 2023-12-28 | Disposition: A | Discharge status: Home / Self Care | Payer: 59 | Source: Ambulatory Visit | Attending: Internal Medicine | Admitting: Internal Medicine

## 2023-12-28 ENCOUNTER — Telehealth (HOSPITAL_COMMUNITY): Payer: Self-pay | Admitting: Licensed Clinical Social Worker

## 2023-12-28 DIAGNOSIS — I5022 Chronic systolic (congestive) heart failure: Secondary | ICD-10-CM | POA: Insufficient documentation

## 2023-12-28 LAB — BASIC METABOLIC PANEL
Anion gap: 10 (ref 5–15)
BUN: 18 mg/dL (ref 6–20)
CO2: 27 mmol/L (ref 22–32)
Calcium: 9.8 mg/dL (ref 8.9–10.3)
Chloride: 102 mmol/L (ref 98–111)
Creatinine, Ser: 1.11 mg/dL — ABNORMAL HIGH (ref 0.44–1.00)
GFR, Estimated: 57 mL/min — ABNORMAL LOW (ref >60)
Glucose, Bld: 188 mg/dL — ABNORMAL HIGH (ref 70–99)
Potassium: 3.8 mmol/L (ref 3.5–5.1)
Sodium: 139 mmol/L (ref 135–145)

## 2023-12-28 MED ORDER — CARVEDILOL 3.125 MG PO TABS: 3.1250 mg | ORAL_TABLET | Freq: Two times a day (BID) | ORAL | 1 refills | Status: DC

## 2023-12-28 MED ORDER — LOSARTAN POTASSIUM 25 MG PO TABS: 25.0000 mg | ORAL_TABLET | Freq: Every day | ORAL | 1 refills | Status: DC

## 2023-12-28 NOTE — Telephone Encounter (Signed)
 H&V Care Navigation CSW Progress Note  Clinical Social Worker called pt to check in re overdue rent and light bill.  They have contacted [employer] and received return communication but no definite options to help with back pay of $2,400 at this time.  They have been unable to go to [facility] to provide proof of turn off notice to expedite LIEAP application- they will bring to lab appt today and CSW will fax to [facility] on their behalf.  Report no transportation to todays lab appt- able to arrange [employer] taxi to bring.   SDOH Screenings   Food Insecurity: Food Insecurity Present (12/21/2023)  Housing: High Risk (12/21/2023)  Transportation Needs: Unmet Transportation Needs (12/21/2023)  Utilities: At Risk (12/21/2023)  Depression (PHQ2-9): High Risk (10/19/2023)  Financial Resource Strain: High Risk (12/21/2023)  Physical Activity: Unknown (10/18/2023)  Recent Concern: Physical Activity - Inactive (08/02/2023)  Social Connections: Moderately Isolated (11/22/2023)  Stress: Stress Concern Present (10/18/2023)  Tobacco Use: Medium Risk (12/21/2023)   12/28/2023  Stacey Lin DOB: 1966/10/14 MRN: [id number]   RIDER WAIVER AND RELEASE OF LIABILITY  For the purposes of helping with transportation needs, [facility] partners with outside transportation providers (taxi companies, [place], Catering manager.) to give [employer] patients or other approved people the choice of on-demand rides [employer]") to our buildings for non-emergency visits.  By using [employer], I, the person signing this document, on behalf of myself and/or any legal minors (in my care using the [employer]), agree:  Science writer given to me are supplied by independent, outside transportation providers who do not work for, or have any affiliation with, [employer]. [place] is not a transportation company. [place] has no control over the quality or safety of the rides I get using [employer]. [place] has  no control over whether any outside ride will happen on time or not. [place] gives no guarantee on the reliability, quality, safety, or availability on any rides, or that no mistakes will happen. I know and accept that traveling by vehicle (car, truck, SVU, Zenaida Niece, bus, taxi, etc.) has risks of serious injuries such as disability, being paralyzed, and death. I know and agree the risk of using [employer] is mine alone, and not [employer]. Transport Services are provided "as is" and as are available. The transportation providers are in charge for all inspections and care of the vehicles used to provide these rides. I agree not to take legal action against [place], its agents, employees, officers, directors, representatives, insurers, attorneys, assigns, successors, subsidiaries, and affiliates at any time for any reasons related directly or indirectly to using [employer]. I also agree not to take legal action against [place] or its affiliates for any injury, death, or damage to property caused by or related to using [employer]. I have read this Waiver and Release of Liability, and I understand the terms used in it and their legal meaning. This Waiver is freely and voluntarily given with the understanding that my right (or any legal minors) to legal action against  relating to [employer] is knowingly given up to use these services.   I attest that I read the Ride Waiver and Release of Liability to Stacey Lin, gave Ms. Ask the opportunity to ask questions and answered the questions asked (if any). I affirm that Stacey Lin then provided consent for assistance with transportation.     Burna Sis

## 2024-01-04 ENCOUNTER — Telehealth (INDEPENDENT_AMBULATORY_CARE_PROVIDER_SITE_OTHER): Payer: Self-pay | Admitting: Primary Care

## 2024-01-04 NOTE — Telephone Encounter (Signed)
 Called pt to inform them that the day they are scheduled, the provider will not be in office. Appt was moved to a better day that works for the pt. Please advise.

## 2024-01-05 ENCOUNTER — Telehealth (HOSPITAL_COMMUNITY): Payer: Self-pay | Admitting: Licensed Clinical Social Worker

## 2024-01-05 NOTE — Telephone Encounter (Signed)
 Attempted to call pt to follow up on assistance referrals as they never sent in proof of overdue bill for CSW to send to [employer]- unable to reach- left VM requesting return call  Burna Sis, LCSW Clinical Social Worker [facility] Desk#: [phone] Cell#: [phone]

## 2024-01-11 ENCOUNTER — Telehealth: Payer: Self-pay | Admitting: Internal Medicine

## 2024-01-11 NOTE — Telephone Encounter (Signed)
 PT calling for FU appt. States she has been in and out of the hospital but wanted to be seen by Dr. Sherene Sires, not Ms. Cobb. Last visit with Ms. Cobb she said to return after the CT scan was done. We do not have an order for a CT scan in under Dr. Thurston Hole name but there is one in from another Dr. That Dr. has not been able to reach her to set that up. I am not sure if we work off that request or put in another request under Dr. Thurston Hole name and have our PCC's call her. I told her someone would call to arrange that.

## 2024-01-11 NOTE — Telephone Encounter (Signed)
 Tried calling the pt and there was no answer and her VM was full  Will call her back later

## 2024-01-15 ENCOUNTER — Ambulatory Visit
Admission: RE | Admit: 2024-01-15 | Discharge: 2024-01-15 | Disposition: A | Discharge status: Home / Self Care | Source: Ambulatory Visit | Attending: Primary Care | Admitting: Primary Care

## 2024-01-15 DIAGNOSIS — Z1231 Encounter for screening mammogram for malignant neoplasm of breast: Secondary | ICD-10-CM | POA: Diagnosis not present

## 2024-01-17 ENCOUNTER — Telehealth (HOSPITAL_COMMUNITY): Payer: Self-pay

## 2024-01-17 NOTE — Telephone Encounter (Signed)
 Called to confirm/remind patient of their appointment at the [facility] on 01/18/24.   Appointment:   [] Confirmed  [x] Left mess   [] No answer/No voice mail  [] Phone not in service  And to bring in all medications and/or complete list.

## 2024-01-18 ENCOUNTER — Encounter (HOSPITAL_COMMUNITY): Payer: 59

## 2024-01-18 NOTE — Telephone Encounter (Signed)
 Appt made. NFN

## 2024-01-19 ENCOUNTER — Encounter (HOSPITAL_COMMUNITY): Payer: Self-pay

## 2024-01-19 ENCOUNTER — Encounter (HOSPITAL_COMMUNITY)

## 2024-02-19 ENCOUNTER — Other Ambulatory Visit (INDEPENDENT_AMBULATORY_CARE_PROVIDER_SITE_OTHER): Payer: Self-pay | Admitting: Primary Care

## 2024-02-19 ENCOUNTER — Telehealth (INDEPENDENT_AMBULATORY_CARE_PROVIDER_SITE_OTHER): Payer: Self-pay | Admitting: Primary Care

## 2024-02-19 ENCOUNTER — Other Ambulatory Visit: Payer: Self-pay | Admitting: Student

## 2024-02-19 NOTE — Telephone Encounter (Signed)
 Will forward to provider

## 2024-02-19 NOTE — Telephone Encounter (Signed)
 Left VM with pt about their upcoming appt.

## 2024-02-19 NOTE — Telephone Encounter (Signed)
 NOT [facility] PATIENT

## 2024-02-21 ENCOUNTER — Other Ambulatory Visit (INDEPENDENT_AMBULATORY_CARE_PROVIDER_SITE_OTHER): Payer: Self-pay | Admitting: Primary Care

## 2024-02-22 ENCOUNTER — Ambulatory Visit (INDEPENDENT_AMBULATORY_CARE_PROVIDER_SITE_OTHER): Payer: 59 | Admitting: Primary Care

## 2024-02-26 ENCOUNTER — Telehealth (INDEPENDENT_AMBULATORY_CARE_PROVIDER_SITE_OTHER): Payer: Self-pay | Admitting: Primary Care

## 2024-02-26 ENCOUNTER — Other Ambulatory Visit (HOSPITAL_COMMUNITY): Payer: Self-pay | Admitting: Primary Care

## 2024-02-26 NOTE — Telephone Encounter (Signed)
 Called to confirm appt. Pt will be present

## 2024-02-27 ENCOUNTER — Ambulatory Visit (INDEPENDENT_AMBULATORY_CARE_PROVIDER_SITE_OTHER): Admitting: Primary Care

## 2024-02-28 ENCOUNTER — Encounter (HOSPITAL_COMMUNITY): Payer: Self-pay

## 2024-02-28 ENCOUNTER — Inpatient Hospital Stay (HOSPITAL_COMMUNITY)
Admission: EM | Admit: 2024-02-28 | Discharge: 2024-03-03 | DRG: 291 | Disposition: A | Discharge status: Home Health | Attending: Internal Medicine | Admitting: Internal Medicine

## 2024-02-28 ENCOUNTER — Other Ambulatory Visit: Payer: Self-pay

## 2024-02-28 ENCOUNTER — Emergency Department (HOSPITAL_COMMUNITY)

## 2024-02-28 DIAGNOSIS — Z7951 Long term (current) use of inhaled steroids: Secondary | ICD-10-CM

## 2024-02-28 DIAGNOSIS — I272 Pulmonary hypertension, unspecified: Secondary | ICD-10-CM | POA: Diagnosis present

## 2024-02-28 DIAGNOSIS — R4702 Dysphasia: Secondary | ICD-10-CM | POA: Diagnosis not present

## 2024-02-28 DIAGNOSIS — I700 Atherosclerosis of aorta: Secondary | ICD-10-CM | POA: Diagnosis not present

## 2024-02-28 DIAGNOSIS — Z1152 Encounter for screening for COVID-19: Secondary | ICD-10-CM | POA: Diagnosis not present

## 2024-02-28 DIAGNOSIS — Z888 Allergy status to other drugs, medicaments and biological substances status: Secondary | ICD-10-CM

## 2024-02-28 DIAGNOSIS — I5023 Acute on chronic systolic (congestive) heart failure: Secondary | ICD-10-CM | POA: Diagnosis present

## 2024-02-28 DIAGNOSIS — Z91148 Patient's other noncompliance with medication regimen for other reason: Secondary | ICD-10-CM | POA: Diagnosis not present

## 2024-02-28 DIAGNOSIS — F17210 Nicotine dependence, cigarettes, uncomplicated: Secondary | ICD-10-CM | POA: Diagnosis present

## 2024-02-28 DIAGNOSIS — E1165 Type 2 diabetes mellitus with hyperglycemia: Secondary | ICD-10-CM | POA: Diagnosis not present

## 2024-02-28 DIAGNOSIS — I340 Nonrheumatic mitral (valve) insufficiency: Secondary | ICD-10-CM | POA: Diagnosis present

## 2024-02-28 DIAGNOSIS — Z8249 Family history of ischemic heart disease and other diseases of the circulatory system: Secondary | ICD-10-CM | POA: Diagnosis not present

## 2024-02-28 DIAGNOSIS — F32A Depression, unspecified: Secondary | ICD-10-CM | POA: Diagnosis present

## 2024-02-28 DIAGNOSIS — J441 Chronic obstructive pulmonary disease with (acute) exacerbation: Secondary | ICD-10-CM | POA: Diagnosis not present

## 2024-02-28 DIAGNOSIS — N179 Acute kidney failure, unspecified: Secondary | ICD-10-CM | POA: Diagnosis not present

## 2024-02-28 DIAGNOSIS — I509 Heart failure, unspecified: Secondary | ICD-10-CM | POA: Diagnosis not present

## 2024-02-28 DIAGNOSIS — Z800 Family history of malignant neoplasm of digestive organs: Secondary | ICD-10-CM

## 2024-02-28 DIAGNOSIS — R0682 Tachypnea, not elsewhere classified: Secondary | ICD-10-CM | POA: Diagnosis not present

## 2024-02-28 DIAGNOSIS — Z716 Tobacco abuse counseling: Secondary | ICD-10-CM

## 2024-02-28 DIAGNOSIS — Z79899 Other long term (current) drug therapy: Secondary | ICD-10-CM | POA: Diagnosis not present

## 2024-02-28 DIAGNOSIS — I110 Hypertensive heart disease with heart failure: Secondary | ICD-10-CM | POA: Diagnosis not present

## 2024-02-28 DIAGNOSIS — J9601 Acute respiratory failure with hypoxia: Secondary | ICD-10-CM | POA: Diagnosis present

## 2024-02-28 DIAGNOSIS — J811 Chronic pulmonary edema: Secondary | ICD-10-CM | POA: Diagnosis not present

## 2024-02-28 DIAGNOSIS — E7841 Elevated Lipoprotein(a): Secondary | ICD-10-CM

## 2024-02-28 DIAGNOSIS — Z825 Family history of asthma and other chronic lower respiratory diseases: Secondary | ICD-10-CM

## 2024-02-28 DIAGNOSIS — R0602 Shortness of breath: Secondary | ICD-10-CM | POA: Diagnosis not present

## 2024-02-28 DIAGNOSIS — J9600 Acute respiratory failure, unspecified whether with hypoxia or hypercapnia: Secondary | ICD-10-CM

## 2024-02-28 DIAGNOSIS — Z7984 Long term (current) use of oral hypoglycemic drugs: Secondary | ICD-10-CM | POA: Diagnosis not present

## 2024-02-28 DIAGNOSIS — E7800 Pure hypercholesterolemia, unspecified: Secondary | ICD-10-CM | POA: Diagnosis present

## 2024-02-28 DIAGNOSIS — R0902 Hypoxemia: Secondary | ICD-10-CM | POA: Diagnosis not present

## 2024-02-28 DIAGNOSIS — Z820 Family history of epilepsy and other diseases of the nervous system: Secondary | ICD-10-CM

## 2024-02-28 DIAGNOSIS — F1410 Cocaine abuse, uncomplicated: Secondary | ICD-10-CM | POA: Diagnosis present

## 2024-02-28 DIAGNOSIS — R6889 Other general symptoms and signs: Secondary | ICD-10-CM | POA: Diagnosis not present

## 2024-02-28 LAB — URINALYSIS, ROUTINE W REFLEX MICROSCOPIC
Bilirubin Urine: NEGATIVE
Glucose, UA: NEGATIVE mg/dL
Hgb urine dipstick: NEGATIVE
Ketones, ur: NEGATIVE mg/dL
Leukocytes,Ua: NEGATIVE
Nitrite: NEGATIVE
Protein, ur: NEGATIVE mg/dL
Specific Gravity, Urine: 1.015 (ref 1.005–1.030)
pH: 6 (ref 5.0–8.0)

## 2024-02-28 LAB — RESPIRATORY PANEL BY PCR

## 2024-02-28 LAB — CBC WITH DIFFERENTIAL/PLATELET
Abs Immature Granulocytes: 0.03 10*3/uL (ref 0.00–0.07)
Basophils Absolute: 0.1 10*3/uL (ref 0.0–0.1)
Basophils Relative: 1 %
Eosinophils Absolute: 0.2 10*3/uL (ref 0.0–0.5)
Eosinophils Relative: 2 %
HCT: 44.2 % (ref 36.0–46.0)
Hemoglobin: 13.7 g/dL (ref 12.0–15.0)
Immature Granulocytes: 0 %
Lymphocytes Relative: 20 %
Lymphs Abs: 2 10*3/uL (ref 0.7–4.0)
MCH: 31.1 pg (ref 26.0–34.0)
MCHC: 31 g/dL (ref 30.0–36.0)
MCV: 100.2 fL — ABNORMAL HIGH (ref 80.0–100.0)
Monocytes Absolute: 0.5 10*3/uL (ref 0.1–1.0)
Monocytes Relative: 5 %
Neutro Abs: 7.5 10*3/uL (ref 1.7–7.7)
Neutrophils Relative %: 72 %
Platelets: 239 10*3/uL (ref 150–400)
RBC: 4.41 MIL/uL (ref 3.87–5.11)
RDW: 13.2 % (ref 11.5–15.5)
WBC: 10.3 10*3/uL (ref 4.0–10.5)
nRBC: 0 % (ref 0.0–0.2)

## 2024-02-28 LAB — BASIC METABOLIC PANEL WITH GFR
Anion gap: 7 (ref 5–15)
BUN: 10 mg/dL (ref 6–20)
CO2: 23 mmol/L (ref 22–32)
Calcium: 8.8 mg/dL — ABNORMAL LOW (ref 8.9–10.3)
Chloride: 112 mmol/L — ABNORMAL HIGH (ref 98–111)
Creatinine, Ser: 0.86 mg/dL (ref 0.44–1.00)
GFR, Estimated: >60 mL/min (ref >60)
Glucose, Bld: 157 mg/dL — ABNORMAL HIGH (ref 70–99)
Potassium: 3.7 mmol/L (ref 3.5–5.1)
Sodium: 142 mmol/L (ref 135–145)

## 2024-02-28 LAB — I-STAT ARTERIAL BLOOD GAS, ED
Acid-base deficit: 1 mmol/L (ref 0.0–2.0)
Bicarbonate: 24 mmol/L (ref 20.0–28.0)
Calcium, Ion: 1.29 mmol/L (ref 1.15–1.40)
HCT: 44 % (ref 36.0–46.0)
Hemoglobin: 15 g/dL (ref 12.0–15.0)
O2 Saturation: 89 %
Patient temperature: 36.7
Potassium: 4 mmol/L (ref 3.5–5.1)
Sodium: 141 mmol/L (ref 135–145)
TCO2: 25 mmol/L (ref 22–32)
pCO2 arterial: 41.3 mmHg (ref 32–48)
pH, Arterial: 7.371 (ref 7.35–7.45)
pO2, Arterial: 58 mmHg — ABNORMAL LOW (ref 83–108)

## 2024-02-28 LAB — HEMOGLOBIN A1C
Hgb A1c MFr Bld: 5.8 % — ABNORMAL HIGH (ref 4.8–5.6)
Mean Plasma Glucose: 119.76 mg/dL

## 2024-02-28 LAB — RESP PANEL BY RT-PCR (RSV, FLU A&B, COVID)  RVPGX2
Influenza A by PCR: NEGATIVE
Influenza B by PCR: NEGATIVE
Resp Syncytial Virus by PCR: NEGATIVE
SARS Coronavirus 2 by RT PCR: NEGATIVE

## 2024-02-28 LAB — TROPONIN I (HIGH SENSITIVITY)
Troponin I (High Sensitivity): 24 ng/L — ABNORMAL HIGH (ref <18)
Troponin I (High Sensitivity): 24 ng/L — ABNORMAL HIGH (ref <18)

## 2024-02-28 LAB — RAPID URINE DRUG SCREEN, HOSP PERFORMED
Amphetamines: NOT DETECTED
Barbiturates: NOT DETECTED
Benzodiazepines: NOT DETECTED
Cocaine: POSITIVE — AB
Opiates: NOT DETECTED
Tetrahydrocannabinol: NOT DETECTED

## 2024-02-28 LAB — GLUCOSE, CAPILLARY: Glucose-Capillary: 275 mg/dL — ABNORMAL HIGH (ref 70–99)

## 2024-02-28 LAB — BRAIN NATRIURETIC PEPTIDE: B Natriuretic Peptide: 1618.8 pg/mL — ABNORMAL HIGH (ref 0.0–100.0)

## 2024-02-28 MED ORDER — PREDNISONE 20 MG PO TABS
40.0000 mg | ORAL_TABLET | Freq: Every day | ORAL | Status: DC
  Administered 2024-02-29: 40 mg via ORAL
  Filled 2024-02-28: qty 2

## 2024-02-28 MED ORDER — ATORVASTATIN CALCIUM 40 MG PO TABS
40.0000 mg | ORAL_TABLET | Freq: Every day | ORAL | Status: DC
  Administered 2024-02-28 – 2024-03-03 (×5): 40 mg via ORAL
  Filled 2024-02-28 (×5): qty 1

## 2024-02-28 MED ORDER — IPRATROPIUM-ALBUTEROL 0.5-2.5 (3) MG/3ML IN SOLN: 3.0000 mL | RESPIRATORY_TRACT | Status: DC

## 2024-02-28 MED ORDER — SPIRONOLACTONE 25 MG PO TABS
25.0000 mg | ORAL_TABLET | Freq: Every day | ORAL | Status: DC
  Administered 2024-02-28 – 2024-03-03 (×5): 25 mg via ORAL
  Filled 2024-02-28 (×5): qty 1

## 2024-02-28 MED ORDER — FUROSEMIDE 20 MG PO TABS: 40.0000 mg | ORAL_TABLET | Freq: Every day | ORAL | Status: DC

## 2024-02-28 MED ORDER — LOSARTAN POTASSIUM 25 MG PO TABS
25.0000 mg | ORAL_TABLET | Freq: Every day | ORAL | Status: DC
  Administered 2024-02-28 – 2024-03-03 (×5): 25 mg via ORAL
  Filled 2024-02-28 (×5): qty 1

## 2024-02-28 MED ORDER — ACETAMINOPHEN 325 MG PO TABS
650.0000 mg | ORAL_TABLET | Freq: Four times a day (QID) | ORAL | Status: DC | PRN
  Administered 2024-02-29 – 2024-03-02 (×3): 650 mg via ORAL
  Filled 2024-02-28 (×3): qty 2

## 2024-02-28 MED ORDER — GABAPENTIN 300 MG PO CAPS
300.0000 mg | ORAL_CAPSULE | Freq: Two times a day (BID) | ORAL | Status: DC
  Administered 2024-02-28 – 2024-03-03 (×8): 300 mg via ORAL
  Filled 2024-02-28 (×8): qty 1

## 2024-02-28 MED ORDER — FUROSEMIDE 10 MG/ML IJ SOLN
40.0000 mg | Freq: Once | INTRAMUSCULAR | Status: AC
  Administered 2024-02-29: 40 mg via INTRAVENOUS
  Filled 2024-02-28: qty 4

## 2024-02-28 MED ORDER — ESCITALOPRAM OXALATE 10 MG PO TABS
20.0000 mg | ORAL_TABLET | Freq: Every day | ORAL | Status: DC
  Administered 2024-02-28 – 2024-03-03 (×5): 20 mg via ORAL
  Filled 2024-02-28 (×5): qty 2

## 2024-02-28 MED ORDER — BUDESON-GLYCOPYRROL-FORMOTEROL 160-9-4.8 MCG/ACT IN AERO
2.0000 | INHALATION_SPRAY | Freq: Two times a day (BID) | RESPIRATORY_TRACT | Status: DC
  Administered 2024-02-28 – 2024-03-03 (×8): 2 via RESPIRATORY_TRACT
  Filled 2024-02-28: qty 5.9

## 2024-02-28 MED ORDER — ACETAMINOPHEN 650 MG RE SUPP: 650.0000 mg | Freq: Four times a day (QID) | RECTAL | Status: DC | PRN

## 2024-02-28 MED ORDER — ENOXAPARIN SODIUM 40 MG/0.4ML IJ SOSY
40.0000 mg | PREFILLED_SYRINGE | INTRAMUSCULAR | Status: DC
  Administered 2024-02-28 – 2024-03-02 (×4): 40 mg via SUBCUTANEOUS
  Filled 2024-02-28 (×4): qty 0.4

## 2024-02-28 MED ORDER — FUROSEMIDE 10 MG/ML IJ SOLN
40.0000 mg | Freq: Once | INTRAMUSCULAR | Status: AC
  Administered 2024-02-28: 40 mg via INTRAVENOUS
  Filled 2024-02-28: qty 4

## 2024-02-28 MED ORDER — CARVEDILOL 3.125 MG PO TABS
3.1250 mg | ORAL_TABLET | Freq: Two times a day (BID) | ORAL | Status: DC
  Filled 2024-02-28: qty 1

## 2024-02-28 MED ORDER — IPRATROPIUM-ALBUTEROL 0.5-2.5 (3) MG/3ML IN SOLN
3.0000 mL | Freq: Four times a day (QID) | RESPIRATORY_TRACT | Status: DC
  Administered 2024-02-28 – 2024-02-29 (×2): 3 mL via RESPIRATORY_TRACT
  Filled 2024-02-28 (×3): qty 3

## 2024-02-28 MED ORDER — DAPAGLIFLOZIN PROPANEDIOL 10 MG PO TABS
10.0000 mg | ORAL_TABLET | Freq: Every day | ORAL | Status: DC
  Administered 2024-02-28 – 2024-03-03 (×5): 10 mg via ORAL
  Filled 2024-02-28 (×5): qty 1

## 2024-02-28 NOTE — TOC CM/SW Note (Signed)
 SW added substance abuse/tobacco resources to patient's AVS.   .Winfield Hau, MSW, LCSWA Transition of Care  Clinical Social Worker (ED 3-11 Mon-Fri)  [phone]

## 2024-02-28 NOTE — ED Triage Notes (Signed)
 PT BIB EMS from home for respiratory distress, on CPAP, called out  for SOB, hx of COPD, Asthma, CHF, tried some at home treatments, no relief, very diminished lung sounds on scene.  3 total Duonebs, 2 grams Magnesium , 125 Solu-medrol ,   18 L AC HR 84 160/114 100% on CPAP 85 % Room air Respiratory rate initially 38 -- 18

## 2024-02-28 NOTE — ED Notes (Signed)
Patient left the floor in stable condition, AOX4, with her belongings and staff.  

## 2024-02-28 NOTE — ED Provider Notes (Signed)
 [facility] EMERGENCY DEPARTMENT AT [facility] Provider Note   CSN: [id number] Arrival date & time: 02/28/24  1313     History  Chief Complaint  Patient presents with   Respiratory Distress    Stacey Lin is a 58 y.o. female.  Pt is a 58 yo female with pmhx significant for COPD, HTN, HLD, CHF, and anemia.  Pt has been having sob and has been using her nebs without improvement in sx.  Pt called EMS and she was 85% on RA.  EMS gave her 3 duonebs, magnesium , and solumedrol.  She was also put on CPAP.  Pt has improved since the cpap.  Pt was switched to bipap upon arrival here.        Home Medications Prior to Admission medications   Medication Sig Start Date End Date Taking? Authorizing Provider  albuterol  (VENTOLIN  HFA) 108 (90 Base) MCG/ACT inhaler Inhale 2 puffs into the lungs every 4 (four) hours as needed for wheezing or shortness of breath. 12/24/23   Marius Siemens, NP  atorvastatin  (LIPITOR) 40 MG tablet Take 1 tablet (40 mg total) by mouth daily. 08/16/23   Newlin, Enobong, MD  carvedilol  (COREG ) 3.125 MG tablet Take 1 tablet (3.125 mg total) by mouth 2 (two) times daily with a meal. 12/28/23   Bensimhon, Rheta Celestine, MD  dapagliflozin  propanediol (FARXIGA ) 10 MG TABS tablet TAKE 1 TABLET BY MOUTH ONCE DAILY 09/19/23   Bensimhon, Rheta Celestine, MD  escitalopram  (LEXAPRO ) 20 MG tablet TAKE 1 TABLET BY MOUTH ONCE DAILY 02/21/24   Marius Siemens, NP  furosemide  (LASIX ) 40 MG tablet Take 1 tablet (40 mg total) by mouth daily. 11/26/23   Danford, Willis Harter, MD  gabapentin  (NEURONTIN ) 300 MG capsule TAKE ONE (1) CAPSULE BY MOUTH TWICE DAILY 09/11/23   Marius Siemens, NP  ipratropium-albuterol  (DUONEB) 0.5-2.5 (3) MG/3ML SOLN Take 3 mLs by nebulization 2 (two) times daily. 07/27/23   Maxie Spaniel, MD  losartan  (COZAAR ) 25 MG tablet Take 1 tablet (25 mg total) by mouth daily. 12/28/23   Bensimhon, Rheta Celestine, MD  metFORMIN  (GLUCOPHAGE -XR) 500 MG 24 hr tablet TAKE 1  TABLET BY MOUTH ONCE DAILY 02/21/24   Marius Siemens, NP  montelukast  (SINGULAIR ) 10 MG tablet TAKE 1 TABLET BY MOUTH AT BEDTIME 09/11/23   Marius Siemens, NP  naloxone  (NARCAN ) nasal spray 4 mg/0.1 mL Inject intranasal for overdose Patient taking differently: Inject intranasal for overdose as needed 04/29/23   Ninetta Basket, MD  nicotine  (NICODERM CQ  - DOSED IN MG/24 HOURS) 14 mg/24hr patch Place 1 patch (14 mg total) onto the skin daily. Patient not taking: Reported on 12/21/2023 11/26/23 11/25/24  Ephriam Hashimoto, MD  spironolactone  (ALDACTONE ) 25 MG tablet TAKE 1 TABLET BY MOUTH ONCE DAILY 08/28/23   Bensimhon, Rheta Celestine, MD  SYMBICORT  80-4.5 MCG/ACT inhaler INHALE TWO (2) PUFFS BY MOUTH TWICE DAILY AS NEEDED FOR SHORTNESS OF BREATH AND/OR WHEEZING 08/14/23   Marius Siemens, NP      Allergies    Ketoconazole     Review of Systems   Review of Systems  Respiratory:  Positive for shortness of breath and wheezing.   All other systems reviewed and are negative.   Physical Exam Updated Vital Signs BP (!) 151/96   Pulse 79   Temp 98 F (36.7 C) (Axillary)   Resp 20   LMP  (LMP Unknown)   SpO2 100%  Physical Exam Vitals and nursing note reviewed.  Constitutional:  General: She is in acute distress.     Appearance: She is ill-appearing.  HENT:     Head: Normocephalic and atraumatic.     Right Ear: External ear normal.     Left Ear: External ear normal.     Mouth/Throat:     Mouth: Mucous membranes are moist.     Pharynx: Oropharynx is clear.  Eyes:     Extraocular Movements: Extraocular movements intact.     Conjunctiva/sclera: Conjunctivae normal.     Pupils: Pupils are equal, round, and reactive to light.  Cardiovascular:     Rate and Rhythm: Normal rate and regular rhythm.     Pulses: Normal pulses.     Heart sounds: Normal heart sounds.  Pulmonary:     Effort: Tachypnea and respiratory distress present.     Breath sounds: Decreased air movement  present. Wheezing present.  Abdominal:     General: Abdomen is flat. Bowel sounds are normal.     Palpations: Abdomen is soft.  Musculoskeletal:        General: Normal range of motion.     Cervical back: Normal range of motion and neck supple.  Skin:    General: Skin is warm.     Capillary Refill: Capillary refill takes less than 2 seconds.  Neurological:     General: No focal deficit present.     Mental Status: She is alert and oriented to person, place, and time.  Psychiatric:        Mood and Affect: Mood normal.        Behavior: Behavior normal.     ED Results / Procedures / Treatments   Labs (all labs ordered are listed, but only abnormal results are displayed) Labs Reviewed  BASIC METABOLIC PANEL WITH GFR - Abnormal; Notable for the following components:      Result Value   Chloride 112 (*)    Glucose, Bld 157 (*)    Calcium  8.8 (*)    All other components within normal limits  CBC WITH DIFFERENTIAL/PLATELET - Abnormal; Notable for the following components:   MCV 100.2 (*)    All other components within normal limits  BRAIN NATRIURETIC PEPTIDE - Abnormal; Notable for the following components:   B Natriuretic Peptide 1,618.8 (*)    All other components within normal limits  TROPONIN I (HIGH SENSITIVITY) - Abnormal; Notable for the following components:   Troponin I (High Sensitivity) 24 (*)    All other components within normal limits  RESP PANEL BY RT-PCR (RSV, FLU A&B, COVID)  [id number]  URINALYSIS, ROUTINE W REFLEX MICROSCOPIC  RAPID URINE DRUG SCREEN, HOSP PERFORMED  I-STAT ARTERIAL BLOOD GAS, ED  TROPONIN I (HIGH SENSITIVITY)    EKG EKG Interpretation Date/Time:  Wednesday Feb 28 2024 13:43:08 EDT Ventricular Rate:  76 PR Interval:  173 QRS Duration:  97 QT Interval:  427 QTC Calculation: 481 R Axis:   -43  Text Interpretation: Sinus rhythm Left atrial enlargement Low voltage, extremity leads Left ventricular hypertrophy No significant change since last  tracing Confirmed by Sueellen Emery [phone]) on 02/28/2024 2:12:24 PM  Radiology DG Chest Port 1 View Result Date: 02/28/2024 CLINICAL DATA:  Shortness of breath EXAM: PORTABLE CHEST 1 VIEW COMPARISON:  11/21/2023 FINDINGS: Atherosclerotic calcification of the aortic arch. Moderate enlargement of the cardiopericardial silhouette similar to prior. Indistinct pulmonary vasculature suspicious for pulmonary venous hypertension. Faint Kerley B lines observed at the right lung base favoring mild interstitial edema. Mild lower thoracic spondylosis. IMPRESSION: 1. Moderate enlargement  of the cardiopericardial silhouette with pulmonary venous hypertension and mild interstitial edema. 2. Aortic Atherosclerosis (ICD10-I70.0). Electronically Signed   By: Freida Jes M.D.   On: 02/28/2024 14:36    Procedures Procedures    Medications Ordered in ED Medications  furosemide  (LASIX ) injection 40 mg (40 mg Intravenous Given 02/28/24 1508)    ED Course/ Medical Decision Making/ A&P                                 Medical Decision Making Amount and/or Complexity of Data Reviewed Labs: ordered. Radiology: ordered.  Risk Prescription drug management. Decision regarding hospitalization.   This patient presents to the ED for concern of sob, this involves an extensive number of treatment options, and is a complaint that carries with it a high risk of complications and morbidity.  The differential diagnosis includes covid/flu/rsv, pna, copd exac   Co morbidities that complicate the patient evaluation  COPD, HTN, HLD, CHF, and anemia   Additional history obtained:  Additional history obtained from epic chart review External records from outside source obtained and reviewed including EMS report   Lab Tests:  I Ordered, and personally interpreted labs.  The pertinent results include:  cbc nl, bmp nl, BNP elevated at 1618, trop 24   Imaging Studies ordered:  I ordered imaging studies including  cxr  I independently visualized and interpreted imaging which showed   Moderate enlargement of the cardiopericardial silhouette with  pulmonary venous hypertension and mild interstitial edema.  2. Aortic Atherosclerosis (ICD10-I70.0).   I agree with the radiologist interpretation   Cardiac Monitoring:  The patient was maintained on a cardiac monitor.  I personally viewed and interpreted the cardiac monitored which showed an underlying rhythm of: nsr   Medicines ordered and prescription drug management:  I ordered medication including lasix   for chf  Reevaluation of the patient after these medicines showed that the patient improved I have reviewed the patients home medicines and have made adjustments as needed   Test Considered:  ct   Critical Interventions:  bipap   Consultations Obtained:  I requested consultation with the [place] (triad),  and discussed lab and imaging findings as well as pertinent plan -he will admit   Problem List / ED Course:  Resp failure:  likely from copd and chf.  Pt looks much more comfortable on bipap.  We will try to wean her off the bipap.     Reevaluation:  After the interventions noted above, I reevaluated the patient and found that they have :improved   Social Determinants of Health:  Lives at home   Dispostion:  After consideration of the diagnostic results and the patients response to treatment, I feel that the patent would benefit from admission.  CRITICAL CARE Performed by: Sueellen Emery   Total critical care time: 30 minutes  Critical care time was exclusive of separately billable procedures and treating other patients.  Critical care was necessary to treat or prevent imminent or life-threatening deterioration.  Critical care was time spent personally by me on the following activities: development of treatment plan with patient and/or surrogate as well as nursing, discussions with consultants, evaluation of  patient's response to treatment, examination of patient, obtaining history from patient or surrogate, ordering and performing treatments and interventions, ordering and review of laboratory studies, ordering and review of radiographic studies, pulse oximetry and re-evaluation of patient's condition.  Final Clinical Impression(s) / ED Diagnoses Final diagnoses:  COPD exacerbation (HCC)  Acute on chronic congestive heart failure, unspecified heart failure type (HCC)  Acute respiratory failure, unspecified whether with hypoxia or hypercapnia [facility])    Rx / DC Orders ED Discharge Orders     None         Sueellen Emery, MD 02/28/24 1550

## 2024-02-28 NOTE — H&P (Addendum)
 History and Physical    Patient: Stacey Lin ZHY:[id number] DOB: 13-Jun-1966 DOA: 02/28/2024 DOS: the patient was seen and examined on 02/28/2024 PCP: Marius Siemens, NP  Patient coming from: Home  Chief Complaint:  Chief Complaint  Patient presents with   Respiratory Distress   HPI: Stacey Lin is a 58 y.o. female with medical history significant of COPD, chronic systolic HF, T2DM, HTN, MVR, HLD, tobacco use disorder presenting to the ED with [facility].  Patient reports that she began feeling more short of breath since yesterday. She has also noted worsening wheezing at home. Despite using her home albuterol  and symbicort , her symptoms persisted which prompted her to call EMS. EMS arrived and noted patient was hypoxic to 85% on RA. She was given 3 rounds of duonebs, magnesium , and solumedrol en route to the hospital. She was also placed on CPAP with improvement. She denies any fevers, chills, nausea, vomiting, chest pain, palpitations, worsening cough, abdominal pain, urinary changes.  ED course: Patient switched to BiPAP on arrival to ED. Vital signs otherwise stable. CBC unremarkable. BMP with glucose 157, kidney function normal. BNP 1618. Troponins 24 > 24. UA unremarkable. CXR showing cardiomegaly with pulmonary venous HTN and mild interstitial edema. Given dose of IV lasix  40mg  in ED. VBG with pH 7.37, pCO2 41, bicarb 24. Patient weaned off BiPAP to RA, saturating >95%. TRH asked to evaluate patient for admission.   Review of Systems: As mentioned in the history of present illness. All other systems reviewed and are negative. Past Medical History:  Diagnosis Date   Anemia    Arrhythmia    Asthma    CHF (congestive heart failure) (HCC)    1990s   Chronic headaches    COPD (chronic obstructive pulmonary disease) (HCC)    ??   Hypercholesteremia    Hypertension    Past Surgical History:  Procedure Laterality Date   LEFT AND RIGHT HEART CATHETERIZATION WITH CORONARY ANGIOGRAM  N/A 06/06/2014   Procedure: LEFT AND RIGHT HEART CATHETERIZATION WITH CORONARY ANGIOGRAM;  Surgeon: Mardell Shade, MD;  Location: [facility] CATH LAB;  Service: Cardiovascular;  Laterality: N/A;   MULTIPLE EXTRACTIONS WITH ALVEOLOPLASTY Bilateral 07/24/2015   Procedure: MULTIPLE EXTRACTIONS WITH ALVEOLOPLASTY,  BILATERAL TORI ;  Surgeon: Ascencion Lava, DDS;  Location: [facility] OR;  Service: Oral Surgery;  Laterality: Bilateral;   RIGHT/LEFT HEART CATH AND CORONARY ANGIOGRAPHY N/A 04/11/2022   Procedure: RIGHT/LEFT HEART CATH AND CORONARY ANGIOGRAPHY;  Surgeon: Lucendia Rusk, MD;  Location: [facility] INVASIVE CV LAB;  Service: Cardiovascular;  Laterality: N/A;   TUBAL LIGATION  1986   Social History:  reports that she quit smoking about 4 years ago. Her smoking use included cigarettes. She started smoking about 39 years ago. She has a 35 pack-year smoking history. She has never used smokeless tobacco. She reports that she does not currently use drugs after having used the following drugs: "Crack" cocaine . Frequency: 4.00 times per week. She reports that she does not drink alcohol.  Allergies  Allergen Reactions   Ketoconazole  Rash    Family History  Problem Relation Age of Onset   Hypertension Mother    Allergies Mother    Asthma Mother    Heart disease Mother    Stomach cancer Mother        Deceased, 43   Seizures Mother    Hypertension Father        Deceased   Hypertension Maternal Grandmother    Healthy Brother    Healthy Son  Healthy Daughter     Prior to Admission medications   Medication Sig Start Date End Date Taking? Authorizing Provider  albuterol  (VENTOLIN  HFA) 108 (90 Base) MCG/ACT inhaler Inhale 2 puffs into the lungs every 4 (four) hours as needed for wheezing or shortness of breath. 12/24/23   Marius Siemens, NP  atorvastatin  (LIPITOR) 40 MG tablet Take 1 tablet (40 mg total) by mouth daily. 08/16/23   Newlin, Enobong, MD  carvedilol  (COREG ) 3.125 MG tablet Take 1 tablet  (3.125 mg total) by mouth 2 (two) times daily with a meal. 12/28/23   Bensimhon, Rheta Celestine, MD  dapagliflozin  propanediol (FARXIGA ) 10 MG TABS tablet TAKE 1 TABLET BY MOUTH ONCE DAILY 09/19/23   Bensimhon, Rheta Celestine, MD  escitalopram  (LEXAPRO ) 20 MG tablet TAKE 1 TABLET BY MOUTH ONCE DAILY 02/21/24   Marius Siemens, NP  furosemide  (LASIX ) 40 MG tablet Take 1 tablet (40 mg total) by mouth daily. 11/26/23   Danford, Willis Harter, MD  gabapentin  (NEURONTIN ) 300 MG capsule TAKE ONE (1) CAPSULE BY MOUTH TWICE DAILY 09/11/23   Marius Siemens, NP  ipratropium-albuterol  (DUONEB) 0.5-2.5 (3) MG/3ML SOLN Take 3 mLs by nebulization 2 (two) times daily. 07/27/23   Maxie Spaniel, MD  losartan  (COZAAR ) 25 MG tablet Take 1 tablet (25 mg total) by mouth daily. 12/28/23   Bensimhon, Rheta Celestine, MD  metFORMIN  (GLUCOPHAGE -XR) 500 MG 24 hr tablet TAKE 1 TABLET BY MOUTH ONCE DAILY 02/21/24   Marius Siemens, NP  montelukast  (SINGULAIR ) 10 MG tablet TAKE 1 TABLET BY MOUTH AT BEDTIME 09/11/23   Marius Siemens, NP  naloxone  (NARCAN ) nasal spray 4 mg/0.1 mL Inject intranasal for overdose Patient taking differently: Inject intranasal for overdose as needed 04/29/23   Ninetta Basket, MD  nicotine  (NICODERM CQ  - DOSED IN MG/24 HOURS) 14 mg/24hr patch Place 1 patch (14 mg total) onto the skin daily. Patient not taking: Reported on 12/21/2023 11/26/23 11/25/24  Ephriam Hashimoto, MD  spironolactone  (ALDACTONE ) 25 MG tablet TAKE 1 TABLET BY MOUTH ONCE DAILY 08/28/23   Bensimhon, Rheta Celestine, MD  SYMBICORT  80-4.5 MCG/ACT inhaler INHALE TWO (2) PUFFS BY MOUTH TWICE DAILY AS NEEDED FOR SHORTNESS OF BREATH AND/OR WHEEZING 08/14/23   Marius Siemens, NP    Physical Exam: Vitals:   02/28/24 1319 02/28/24 1322 02/28/24 1323 02/28/24 1415  BP: (!) 147/103   (!) 151/96  Pulse:   75 79  Resp:    20  Temp:  98 F (36.7 C)    TempSrc:  Axillary    SpO2:   100% 100%   Physical Exam Constitutional:      General: She is  not in acute distress.    Appearance: Normal appearance. She is not ill-appearing.  HENT:     Head: Normocephalic and atraumatic.     Mouth/Throat:     Mouth: Mucous membranes are moist.     Pharynx: Oropharynx is clear. No oropharyngeal exudate.  Eyes:     General: No scleral icterus.    Extraocular Movements: Extraocular movements intact.     Conjunctiva/sclera: Conjunctivae normal.     Pupils: Pupils are equal, round, and reactive to light.  Cardiovascular:     Rate and Rhythm: Normal rate and regular rhythm.     Heart sounds: Normal heart sounds.     No friction rub. No gallop.  Pulmonary:     Effort: Pulmonary effort is normal. No respiratory distress.     Breath sounds: No rhonchi.  Comments: Mild wheezing noted bilaterally, no crackles on exam. Saturating >96% on RA. Abdominal:     General: Bowel sounds are normal. There is no distension.     Palpations: Abdomen is soft.     Tenderness: There is no abdominal tenderness. There is no guarding or rebound.  Musculoskeletal:        General: Normal range of motion.     Cervical back: Normal range of motion.     Comments: Trace peripheral edema noted in bilateral LE.  Skin:    General: Skin is warm and dry.  Neurological:     General: No focal deficit present.     Mental Status: She is alert and oriented to person, place, and time.  Psychiatric:        Mood and Affect: Mood normal.        Behavior: Behavior normal.     Data Reviewed:  There are no new results to review at this time.    Latest Ref Rng & Units 02/28/2024    4:03 PM 02/28/2024    1:13 PM 11/26/2023    2:46 AM  CBC  WBC 4.0 - 10.5 K/uL  10.3  10.6   Hemoglobin 12.0 - 15.0 g/dL 16.1  09.6  04.5   Hematocrit 36.0 - 46.0 % 44.0  44.2  45.3   Platelets 150 - 400 K/uL  239  222       Latest Ref Rng & Units 02/28/2024    4:03 PM 02/28/2024    1:13 PM 12/28/2023    2:51 PM  CMP  Glucose 70 - 99 mg/dL  409  811   BUN 6 - 20 mg/dL  10  18   Creatinine 9.14 -  1.00 mg/dL  7.82  9.56   Sodium 213 - 145 mmol/L 141  142  139   Potassium 3.5 - 5.1 mmol/L 4.0  3.7  3.8   Chloride 98 - 111 mmol/L  112  102   CO2 22 - 32 mmol/L  23  27   Calcium  8.9 - 10.3 mg/dL  8.8  9.8    BNP    Component Value Date/Time   BNP 1,618.8 (H) 02/28/2024 1313   Urinalysis    Component Value Date/Time   COLORURINE YELLOW 02/28/2024 1552   APPEARANCEUR CLEAR 02/28/2024 1552   LABSPEC 1.015 02/28/2024 1552   PHURINE 6.0 02/28/2024 1552   GLUCOSEU NEGATIVE 02/28/2024 1552   HGBUR NEGATIVE 02/28/2024 1552   BILIRUBINUR NEGATIVE 02/28/2024 1552   BILIRUBINUR negative 07/09/2018 1628   KETONESUR NEGATIVE 02/28/2024 1552   PROTEINUR NEGATIVE 02/28/2024 1552   UROBILINOGEN 0.2 09/17/2018 1623   UROBILINOGEN 0.2 03/14/2014 0045   NITRITE NEGATIVE 02/28/2024 1552   LEUKOCYTESUR NEGATIVE 02/28/2024 1552    Hs-troponins: 24 > 24  DG Chest Port 1 View Result Date: 02/28/2024 CLINICAL DATA:  Shortness of breath EXAM: PORTABLE CHEST 1 VIEW COMPARISON:  11/21/2023 FINDINGS: Atherosclerotic calcification of the aortic arch. Moderate enlargement of the cardiopericardial silhouette similar to prior. Indistinct pulmonary vasculature suspicious for pulmonary venous hypertension. Faint Kerley B lines observed at the right lung base favoring mild interstitial edema. Mild lower thoracic spondylosis. IMPRESSION: 1. Moderate enlargement of the cardiopericardial silhouette with pulmonary venous hypertension and mild interstitial edema. 2. Aortic Atherosclerosis (ICD10-I70.0). Electronically Signed   By: Freida Jes M.D.   On: 02/28/2024 14:36     Assessment and Plan: No notes have been filed under this hospital service. Service: Hospitalist  Acute hypoxic respiratory failure 2/2  COPD exacerbation Patient presented with SHOB, hypoxia, and wheezing, found to be in COPD exacerbation. She initially required BiPAP on arrival to ED but VBG with improvement.  She has since been  weaned off BiPAP and transition to room air and is maintaining good oxygen saturations.  She does still have mild wheezing bilaterally, but is otherwise hemodynamically stable.  She has received Solu-Medrol  and 3 rounds of DuoNebs via EMS and route to the hospital.  Will check a respiratory viral panel to rule out a viral etiology.  Otherwise, will continue bronchodilators and steroids. - Breztri  twice daily, DuoNebs every 6 hours - Status post Solu-Medrol  today, transition to prednisone  40 mg daily from tomorrow for 4 more days - Follow-up respiratory viral panel, droplet precautions until this results - Supplemental oxygen as needed to maintain O2 sats greater than 88% - Lovenox  for DVT prophylaxis - Place in observation/progressive  Acute on chronic systolic HF Pulmonary hypertension MVR Last ECHO in 07/2023 with LVEF 40 to 45%, mildly decreased LV function, global hypokinesis, grade 1 diastolic dysfunction, severely elevated pulmonary artery systolic pressure, moderate to severe mitral valve regurgitation.  Patient noted to be volume overloaded during initial ED provider exam, given dose of IV Lasix  40 mg.  She does appear more euvolemic on my exam with only trace pitting edema and no crackles noted.  She does have an elevated BNP in the 1600s.  She is on Lasix  40 mg daily, Coreg  3.125 mg twice daily, Farxiga  10 mg daily, losartan  25 mg daily, spironolactone  25 mg daily at home.  Given she is more euvolemic on exam, will resume her home antihypertensives aside from coreg  (given CHF exacerbation and cocaine  use).  Will provide 1 more dose of IV Lasix  40 mg tomorrow, but likely can transition to her home oral dose thereafter. - Status post IV Lasix  40 mg in the ED today, will provide 1 more dose tomorrow - Resume home Farxiga , losartan , spironolactone  -holding home coreg  - Strict I/O's, daily weights - Follow-up echocardiogram - Telemetry while diuresing - HH/CM diet  T2DM with  hyperglycemia Patient on metformin  500 mg daily and Farxiga  10 mg daily at home.  Blood glucose 157 on BMP today, likely also impacted by dose of Solu-Medrol  given by EMS.  Last A1c of 6.0% in January 2025. - Follow-up hemoglobin A1c - Resume home Farxiga , holding home metformin  - Trend CBGs, goal 140-180 - Consider adding SSI if CBGs above goal - Resume home gabapentin  300 mg twice daily  HTN Patient on Coreg  3.125 mg twice daily, Farxiga  10 mg daily, losartan  25 mg daily, spironolactone  25 mg daily at home.  Blood pressures ranging in the 140s-150s/90s-100s while in the ED.  Will resume her home antihypertensives aside from coreg . - Resume home antihypertensives aside from coreg  - Trend BP curve  HLD - Resume home Lipitor 40 mg daily  Depression - Resume home Lexapro  20 mg daily  Tobacco use disorder Cocaine  use disorder - Patient continues to smoke cigarettes, counseled on importance of smoking cessation - TOC consult placed for substance use resources   Advance Care Planning:   Code Status: Full Code   Consults: none  Family Communication: no family at bedside  Severity of Illness: The appropriate patient status for this patient is OBSERVATION. Observation status is judged to be reasonable and necessary in order to provide the required intensity of service to ensure the patient's safety. The patient's presenting symptoms, physical exam findings, and initial radiographic and laboratory data in the context  of their medical condition is felt to place them at decreased risk for further clinical deterioration. Furthermore, it is anticipated that the patient will be medically stable for discharge from the hospital within 2 midnights of admission.   Portions of this note were generated with Scientist, clinical (histocompatibility and immunogenetics). Dictation errors may occur despite best attempts at proofreading.  Author: Rosaleah Person H Ajani Rineer, MD 02/28/2024 4:41 PM  For on call review [email or web address].

## 2024-02-29 ENCOUNTER — Observation Stay (HOSPITAL_COMMUNITY)

## 2024-02-29 DIAGNOSIS — Z8249 Family history of ischemic heart disease and other diseases of the circulatory system: Secondary | ICD-10-CM | POA: Diagnosis not present

## 2024-02-29 DIAGNOSIS — J9601 Acute respiratory failure with hypoxia: Secondary | ICD-10-CM | POA: Diagnosis present

## 2024-02-29 DIAGNOSIS — F1410 Cocaine abuse, uncomplicated: Secondary | ICD-10-CM | POA: Diagnosis present

## 2024-02-29 DIAGNOSIS — Z7984 Long term (current) use of oral hypoglycemic drugs: Secondary | ICD-10-CM | POA: Diagnosis not present

## 2024-02-29 DIAGNOSIS — Z716 Tobacco abuse counseling: Secondary | ICD-10-CM | POA: Diagnosis not present

## 2024-02-29 DIAGNOSIS — Z91148 Patient's other noncompliance with medication regimen for other reason: Secondary | ICD-10-CM | POA: Diagnosis not present

## 2024-02-29 DIAGNOSIS — N179 Acute kidney failure, unspecified: Secondary | ICD-10-CM | POA: Diagnosis present

## 2024-02-29 DIAGNOSIS — J441 Chronic obstructive pulmonary disease with (acute) exacerbation: Secondary | ICD-10-CM | POA: Diagnosis present

## 2024-02-29 DIAGNOSIS — F17210 Nicotine dependence, cigarettes, uncomplicated: Secondary | ICD-10-CM | POA: Diagnosis present

## 2024-02-29 DIAGNOSIS — Z825 Family history of asthma and other chronic lower respiratory diseases: Secondary | ICD-10-CM | POA: Diagnosis not present

## 2024-02-29 DIAGNOSIS — I5023 Acute on chronic systolic (congestive) heart failure: Secondary | ICD-10-CM | POA: Diagnosis present

## 2024-02-29 DIAGNOSIS — F32A Depression, unspecified: Secondary | ICD-10-CM | POA: Diagnosis present

## 2024-02-29 DIAGNOSIS — Z800 Family history of malignant neoplasm of digestive organs: Secondary | ICD-10-CM | POA: Diagnosis not present

## 2024-02-29 DIAGNOSIS — Z888 Allergy status to other drugs, medicaments and biological substances status: Secondary | ICD-10-CM | POA: Diagnosis not present

## 2024-02-29 DIAGNOSIS — I110 Hypertensive heart disease with heart failure: Secondary | ICD-10-CM | POA: Diagnosis present

## 2024-02-29 DIAGNOSIS — E7800 Pure hypercholesterolemia, unspecified: Secondary | ICD-10-CM | POA: Diagnosis present

## 2024-02-29 DIAGNOSIS — I272 Pulmonary hypertension, unspecified: Secondary | ICD-10-CM | POA: Diagnosis present

## 2024-02-29 DIAGNOSIS — Z1152 Encounter for screening for COVID-19: Secondary | ICD-10-CM | POA: Diagnosis not present

## 2024-02-29 DIAGNOSIS — Z820 Family history of epilepsy and other diseases of the nervous system: Secondary | ICD-10-CM | POA: Diagnosis not present

## 2024-02-29 DIAGNOSIS — I509 Heart failure, unspecified: Secondary | ICD-10-CM

## 2024-02-29 DIAGNOSIS — Z79899 Other long term (current) drug therapy: Secondary | ICD-10-CM | POA: Diagnosis not present

## 2024-02-29 DIAGNOSIS — Z7951 Long term (current) use of inhaled steroids: Secondary | ICD-10-CM | POA: Diagnosis not present

## 2024-02-29 DIAGNOSIS — E1165 Type 2 diabetes mellitus with hyperglycemia: Secondary | ICD-10-CM | POA: Diagnosis present

## 2024-02-29 DIAGNOSIS — I340 Nonrheumatic mitral (valve) insufficiency: Secondary | ICD-10-CM | POA: Diagnosis present

## 2024-02-29 LAB — CBC
HCT: 44.9 % (ref 36.0–46.0)
Hemoglobin: 14.1 g/dL (ref 12.0–15.0)
MCH: 30 pg (ref 26.0–34.0)
MCHC: 31.4 g/dL (ref 30.0–36.0)
MCV: 95.5 fL (ref 80.0–100.0)
Platelets: 255 10*3/uL (ref 150–400)
RBC: 4.7 MIL/uL (ref 3.87–5.11)
RDW: 13.1 % (ref 11.5–15.5)
WBC: 6.4 10*3/uL (ref 4.0–10.5)
nRBC: 0 % (ref 0.0–0.2)

## 2024-02-29 LAB — BASIC METABOLIC PANEL WITH GFR
Anion gap: 8 (ref 5–15)
BUN: 14 mg/dL (ref 6–20)
CO2: 25 mmol/L (ref 22–32)
Calcium: 9.1 mg/dL (ref 8.9–10.3)
Chloride: 108 mmol/L (ref 98–111)
Creatinine, Ser: 0.88 mg/dL (ref 0.44–1.00)
GFR, Estimated: >60 mL/min (ref >60)
Glucose, Bld: 160 mg/dL — ABNORMAL HIGH (ref 70–99)
Potassium: 3.6 mmol/L (ref 3.5–5.1)
Sodium: 141 mmol/L (ref 135–145)

## 2024-02-29 LAB — ECHOCARDIOGRAM COMPLETE
Area-P 1/2: 3.7 cm2
Height: 61 in
MV M vel: 5.06 m/s
MV Peak grad: 102.4 mmHg
S' Lateral: 5 cm
Weight: 2349.22 [oz_av]

## 2024-02-29 LAB — GLUCOSE, CAPILLARY
Glucose-Capillary: 151 mg/dL — ABNORMAL HIGH (ref 70–99)
Glucose-Capillary: 166 mg/dL — ABNORMAL HIGH (ref 70–99)
Glucose-Capillary: 246 mg/dL — ABNORMAL HIGH (ref 70–99)
Glucose-Capillary: 245 mg/dL — ABNORMAL HIGH (ref 70–99)

## 2024-02-29 MED ORDER — IPRATROPIUM-ALBUTEROL 0.5-2.5 (3) MG/3ML IN SOLN
3.0000 mL | Freq: Two times a day (BID) | RESPIRATORY_TRACT | Status: DC | PRN
  Administered 2024-03-01: 3 mL via RESPIRATORY_TRACT
  Filled 2024-02-29: qty 3

## 2024-02-29 MED ORDER — POTASSIUM CHLORIDE CRYS ER 20 MEQ PO TBCR
40.0000 meq | EXTENDED_RELEASE_TABLET | Freq: Two times a day (BID) | ORAL | Status: AC
  Administered 2024-02-29 (×2): 40 meq via ORAL
  Filled 2024-02-29 (×2): qty 2

## 2024-02-29 MED ORDER — MELATONIN 5 MG PO TABS
5.0000 mg | ORAL_TABLET | Freq: Every evening | ORAL | Status: DC | PRN
Start: 2024-02-29 — End: 2024-03-03
  Administered 2024-03-02: 5 mg via ORAL
  Filled 2024-02-29: qty 1

## 2024-02-29 MED ORDER — FUROSEMIDE 10 MG/ML IJ SOLN
60.0000 mg | Freq: Two times a day (BID) | INTRAMUSCULAR | Status: DC
  Administered 2024-02-29: 60 mg via INTRAVENOUS
  Filled 2024-02-29: qty 6

## 2024-02-29 NOTE — TOC CM/SW Note (Signed)
 Transition of Care [facility]) - Inpatient Brief Assessment   Patient Details  Name: Stacey Lin MRN: [id number] Date of Birth: 08-Feb-1966  Transition of Care [facility]) CM/SW Contact:    Arron Big, LCSWA Phone Number: 02/29/2024, 10:25 AM   Clinical Narrative: CSW provided patient with substance use resources and resources for housing, transportation, food, and utilities.   TOC will continue to follow.     Transition of Care Asessment: Insurance and Status: Insurance coverage has been reviewed   Home environment has been reviewed: Home Prior level of function:: Indepenent Prior/Current Home Services: No current home services Social Drivers of Health Review: SDOH reviewed no interventions necessary Readmission risk has been reviewed: No Transition of care needs: no transition of care needs at this time

## 2024-02-29 NOTE — Progress Notes (Addendum)
 PROGRESS NOTE    Stacey Lin  ZOX:[id number] DOB: 07-19-1966 DOA: 02/28/2024 PCP: Marius Siemens, NP    57/F w COPD, chronic systolic HF, T2DM, HTN, MVR, HLD, polysubstance abuse including cocaine , tobacco use disorder presenting to the ED with [facility]. -Only intermittent compliance with meds, uses cocaine  every day and still smokes -In the ER she was hypoxic, placed on BIPAP, given nebs mag etc. labs, creatinine normal, BNP 1618. Troponins 24 > 24. UA unremarkable. CXR showing cardiomegaly with pulmonary venous HTN and mild interstitial edema.   Subjective: -Feels better, breathing is improving  Assessment and Plan:  Acute hypoxic respiratory failure  - I suspect this is primarily from CHF exacerbation  - Required BiPAP on admission, now resolved   Acute on chronic systolic HF Pulmonary hypertension MVR Last ECHO in 07/2023 with LVEF 40 to 45%, mildly decreased LV function, global hypokinesis, grade 1 diastolic dysfunction, severely elevated pulmonary artery systolic pressure, moderate to severe mitral valve regurgitation.   - Repeat IV Lasix  today, 60 Mg twice daily -Continue Coreg , Farxiga , losartan , Aldactone  -Repeat echo ordered on admission, will follow-up - Compliance, substance abuse continue to be barriers, will add home health RN at [place]  COPD, ongoing tobacco use -Does not need additional systemic steroids, continue bronchodilators, breztri     T2DM with hyperglycemia -Continue Farxiga , sliding scale insulin   HLD - Resume home Lipitor 40 mg daily   Depression - Resume home Lexapro  20 mg daily   Tobacco use disorder Cocaine  use disorder - Counseled - TOC consult   DVT prophylaxis: lovenox  Code Status: Full Cpde Family Communication: None present Disposition Plan: Home lik[place] 48h  Consultants:    Procedures:   Antimicrobials:    Objective: Vitals:   02/29/24 0647 02/29/24 0813 02/29/24 0834 02/29/24 1114  BP:   118/68 122/73  Pulse:  79 70 73   Resp:  18 18 18   Temp:   97.8 F (36.6 C) 97.7 F (36.5 C)  TempSrc:   Oral Oral  SpO2:  96% 94% 93%  Weight: 66.6 kg     Height:        Intake/Output Summary (Last 24 hours) at 02/29/2024 1212 Last data filed at 02/29/2024 1112 Gross per 24 hour  Intake 60 ml  Output 2300 ml  Net -2240 ml   Filed Weights   02/28/24 1806 02/29/24 0647  Weight: 67.1 kg 66.6 kg    Examination:  General exam: Appears calm and comfortable  Respiratory system: Clear to auscultation Cardiovascular system: S1 & S2 heard, RRR.  Abd: nondistended, soft and nontender.Normal bowel sounds heard. Central nervous system: Alert and oriented. No focal neurological deficits. Extremities: no edema Skin: No rashes Psychiatry:  Mood & affect appropriate.     Data Reviewed:   CBC: Recent Labs  Lab 02/28/24 1313 02/28/24 1603 02/29/24 0248  WBC 10.3  --  6.4  NEUTROABS 7.5  --   --   HGB 13.7 15.0 14.1  HCT 44.2 44.0 44.9  MCV 100.2*  --  95.5  PLT 239  --  255   Basic Metabolic Panel: Recent Labs  Lab 02/28/24 1313 02/28/24 1603 02/29/24 0248  NA 142 141 141  K 3.7 4.0 3.6  CL 112*  --  108  CO2 23  --  25  GLUCOSE 157*  --  160*  BUN 10  --  14  CREATININE 0.86  --  0.88  CALCIUM  8.8*  --  9.1   GFR: Estimated Creatinine Clearance: 60.1 mL/min (by C-G  formula based on SCr of 0.88 mg/dL). Liver Function Tests: No results for input(s): "AST", "ALT", "ALKPHOS", "BILITOT", "PROT", "ALBUMIN" in the last 168 hours. No results for input(s): "LIPASE", "AMYLASE" in the last 168 hours. No results for input(s): "AMMONIA" in the last 168 hours. Coagulation Profile: No results for input(s): "INR", "PROTIME" in the last 168 hours. Cardiac Enzymes: No results for input(s): "CKTOTAL", "CKMB", "CKMBINDEX", "TROPONINI" in the last 168 hours. BNP (last 3 results) No results for input(s): "PROBNP" in the last 8760 hours. HbA1C: Recent Labs    02/28/24 1313  HGBA1C 5.8*   CBG: Recent Labs   Lab 02/28/24 2029 02/29/24 0607 02/29/24 1111  GLUCAP 275* 151* 166*   Lipid Profile: No results for input(s): "CHOL", "HDL", "LDLCALC", "TRIG", "CHOLHDL", "LDLDIRECT" in the last 72 hours. Thyroid  Function Tests: No results for input(s): "TSH", "T4TOTAL", "FREET4", "T3FREE", "THYROIDAB" in the last 72 hours. Anemia Panel: No results for input(s): "VITAMINB12", "FOLATE", "FERRITIN", "TIBC", "IRON", "RETICCTPCT" in the last 72 hours. Urine analysis:    Component Value Date/Time   COLORURINE YELLOW 02/28/2024 1552   APPEARANCEUR CLEAR 02/28/2024 1552   LABSPEC 1.015 02/28/2024 1552   PHURINE 6.0 02/28/2024 1552   GLUCOSEU NEGATIVE 02/28/2024 1552   HGBUR NEGATIVE 02/28/2024 1552   BILIRUBINUR NEGATIVE 02/28/2024 1552   BILIRUBINUR negative 07/09/2018 1628   KETONESUR NEGATIVE 02/28/2024 1552   PROTEINUR NEGATIVE 02/28/2024 1552   UROBILINOGEN 0.2 09/17/2018 1623   UROBILINOGEN 0.2 03/14/2014 0045   NITRITE NEGATIVE 02/28/2024 1552   LEUKOCYTESUR NEGATIVE 02/28/2024 1552   Sepsis Labs: @LABRCNTIP (procalcitonin:4,lacticidven:4)  ) Recent Results (from the past 240 hours)  Resp panel by RT-PCR (RSV, Flu A&B, Covid) Anterior Nasal Swab     Status: None   Collection Time: 02/28/24  3:12 PM   Specimen: Anterior Nasal Swab  Result Value Ref Range Status   SARS Coronavirus 2 by RT PCR NEGATIVE NEGATIVE Final   Influenza A by PCR NEGATIVE NEGATIVE Final   Influenza B by PCR NEGATIVE NEGATIVE Final    Comment: (NOTE) The Xpert Xpress SARS-CoV-2/FLU/RSV plus assay is intended as an aid in the diagnosis of influenza from Nasopharyngeal swab specimens and should not be used as a sole basis for treatment. Nasal washings and aspirates are unacceptable for Xpert Xpress SARS-CoV-2/FLU/RSV testing.  Fact Sheet for Patients: BloggerCourse.com  Fact Sheet for Healthcare Providers: SeriousBroker.it  This test is not yet approved or  cleared by the [place]  FDA and has been authorized for detection and/or diagnosis of SARS-CoV-2 by FDA under an Emergency Use Authorization (EUA). This EUA will remain in effect (meaning this test can be used) for the duration of the COVID-19 declaration under Section 564(b)(1) of the Act, 21 U.S.C. section 360bbb-3(b)(1), unless the authorization is terminated or revoked.     Resp Syncytial Virus by PCR NEGATIVE NEGATIVE Final    Comment: (NOTE) Fact Sheet for Patients: BloggerCourse.com  Fact Sheet for Healthcare Providers: SeriousBroker.it  This test is not yet approved or cleared by the [place]  FDA and has been authorized for detection and/or diagnosis of SARS-CoV-2 by FDA under an Emergency Use Authorization (EUA). This EUA will remain in effect (meaning this test can be used) for the duration of the COVID-19 declaration under Section 564(b)(1) of the Act, 21 U.S.C. section 360bbb-3(b)(1), unless the authorization is terminated or revoked.  Performed at [facility], [address], [place], [place] [address]   Respiratory (~20 pathogens) panel by PCR     Status: None  Collection Time: 02/28/24  3:12 PM   Specimen: Nasopharyngeal Swab; Respiratory  Result Value Ref Range Status   Adenovirus NOT DETECTED NOT DETECTED Final   Coronavirus 229E NOT DETECTED NOT DETECTED Final    Comment: (NOTE) The Coronavirus on the Respiratory Panel, DOES NOT test for the novel  Coronavirus (2019 nCoV)    Coronavirus HKU1 NOT DETECTED NOT DETECTED Final   Coronavirus NL63 NOT DETECTED NOT DETECTED Final   Coronavirus OC43 NOT DETECTED NOT DETECTED Final   Metapneumovirus NOT DETECTED NOT DETECTED Final   Rhinovirus / Enterovirus NOT DETECTED NOT DETECTED Final   Influenza A NOT DETECTED NOT DETECTED Final   Influenza B NOT DETECTED NOT DETECTED Final   Parainfluenza Virus 1 NOT DETECTED NOT DETECTED Final    Parainfluenza Virus 2 NOT DETECTED NOT DETECTED Final   Parainfluenza Virus 3 NOT DETECTED NOT DETECTED Final   Parainfluenza Virus 4 NOT DETECTED NOT DETECTED Final   Respiratory Syncytial Virus NOT DETECTED NOT DETECTED Final   Bordetella pertussis NOT DETECTED NOT DETECTED Final   Bordetella Parapertussis NOT DETECTED NOT DETECTED Final   Chlamydophila pneumoniae NOT DETECTED NOT DETECTED Final   Mycoplasma pneumoniae NOT DETECTED NOT DETECTED Final    Comment: Performed at [facility], [address], [place], [place] [address]     Radiology Studies: DG Chest Port 1 View Result Date: 02/28/2024 CLINICAL DATA:  Shortness of breath EXAM: PORTABLE CHEST 1 VIEW COMPARISON:  11/21/2023 FINDINGS: Atherosclerotic calcification of the aortic arch. Moderate enlargement of the cardiopericardial silhouette similar to prior. Indistinct pulmonary vasculature suspicious for pulmonary venous hypertension. Faint Kerley B lines observed at the right lung base favoring mild interstitial edema. Mild lower thoracic spondylosis. IMPRESSION: 1. Moderate enlargement of the cardiopericardial silhouette with pulmonary venous hypertension and mild interstitial edema. 2. Aortic Atherosclerosis (ICD10-I70.0). Electronically Signed   By: Freida Jes M.D.   On: 02/28/2024 14:36     Scheduled Meds:  atorvastatin   40 mg Oral Daily   budeson-glycopyrrolate-formoterol   2 puff Inhalation BID   dapagliflozin  propanediol  10 mg Oral Daily   enoxaparin  (LOVENOX ) injection  40 mg Subcutaneous Q24H   escitalopram   20 mg Oral Daily   gabapentin   300 mg Oral BID   losartan   25 mg Oral Daily   predniSONE   40 mg Oral Q breakfast   spironolactone   25 mg Oral Daily   Continuous Infusions:   LOS: 0 days    Time spent:    Deforest Fast, MD [facility]   02/29/2024, 12:12 PM

## 2024-02-29 NOTE — Care Management Obs Status (Signed)
 MEDICARE OBSERVATION STATUS NOTIFICATION   Patient Details  Name: Stacey Lin MRN: [id number] Date of Birth: 02-Mar-1966   Medicare Observation Status Notification Given:  Yes  Moon/Obs letter signed  and copy given  Wynonia Hedges 02/29/2024, 3:58 PM

## 2024-02-29 NOTE — Plan of Care (Signed)
   Problem: Coping: Goal: Ability to adjust to condition or change in health will improve Outcome: Progressing   Problem: Fluid Volume: Goal: Ability to maintain a balanced intake and output will improve Outcome: Progressing   Problem: Nutritional: Goal: Maintenance of adequate nutrition will improve Outcome: Progressing   Problem: Skin Integrity: Goal: Risk for impaired skin integrity will decrease Outcome: Progressing

## 2024-02-29 NOTE — Progress Notes (Signed)
 RN rounded on patient to administer IV lasix  and PO potassium. Patient relayed to RN to come back, her sleep was interrupted with ECHO. RN educated patient on medication and will re-attempt per request.

## 2024-03-01 DIAGNOSIS — J441 Chronic obstructive pulmonary disease with (acute) exacerbation: Secondary | ICD-10-CM | POA: Diagnosis not present

## 2024-03-01 LAB — BASIC METABOLIC PANEL WITH GFR
Anion gap: 10 (ref 5–15)
BUN: 25 mg/dL — ABNORMAL HIGH (ref 6–20)
CO2: 29 mmol/L (ref 22–32)
Calcium: 9 mg/dL (ref 8.9–10.3)
Chloride: 100 mmol/L (ref 98–111)
Creatinine, Ser: 1.22 mg/dL — ABNORMAL HIGH (ref 0.44–1.00)
GFR, Estimated: 51 mL/min — ABNORMAL LOW (ref >60)
Glucose, Bld: 191 mg/dL — ABNORMAL HIGH (ref 70–99)
Potassium: 3.9 mmol/L (ref 3.5–5.1)
Sodium: 139 mmol/L (ref 135–145)

## 2024-03-01 LAB — GLUCOSE, CAPILLARY
Glucose-Capillary: 112 mg/dL — ABNORMAL HIGH (ref 70–99)
Glucose-Capillary: 152 mg/dL — ABNORMAL HIGH (ref 70–99)
Glucose-Capillary: 202 mg/dL — ABNORMAL HIGH (ref 70–99)
Glucose-Capillary: 210 mg/dL — ABNORMAL HIGH (ref 70–99)

## 2024-03-01 MED ORDER — FUROSEMIDE 10 MG/ML IJ SOLN: 40.0000 mg | Freq: Two times a day (BID) | INTRAMUSCULAR | Status: DC

## 2024-03-01 MED ORDER — FUROSEMIDE 10 MG/ML IJ SOLN
40.0000 mg | Freq: Two times a day (BID) | INTRAMUSCULAR | Status: DC
  Administered 2024-03-01 – 2024-03-02 (×2): 40 mg via INTRAVENOUS
  Filled 2024-03-01 (×2): qty 4

## 2024-03-01 MED ORDER — FUROSEMIDE 10 MG/ML IJ SOLN
60.0000 mg | Freq: Once | INTRAMUSCULAR | Status: AC
  Administered 2024-03-01: 60 mg via INTRAVENOUS
  Filled 2024-03-01: qty 6

## 2024-03-01 MED ORDER — HYDROCODONE-ACETAMINOPHEN 5-325 MG PO TABS
1.0000 | ORAL_TABLET | Freq: Two times a day (BID) | ORAL | Status: DC | PRN
  Administered 2024-03-01 – 2024-03-02 (×3): 1 via ORAL
  Filled 2024-03-01 (×4): qty 1

## 2024-03-01 MED ORDER — POTASSIUM CHLORIDE CRYS ER 20 MEQ PO TBCR
40.0000 meq | EXTENDED_RELEASE_TABLET | Freq: Once | ORAL | Status: AC
  Administered 2024-03-01: 40 meq via ORAL
  Filled 2024-03-01: qty 2

## 2024-03-01 NOTE — Progress Notes (Signed)
 Heart Failure Navigator Progress Note  Assessed for Heart & Vascular TOC clinic readiness.  Patient does not meet criteria due to established with advanced heart failure clinic, patient of Dr. Bensimhon. Reached out to scheduler and post-hospital follow up appt was made for 5/23.   Navigator will sign off.    Jerilyn Monte, PharmD, BCPS Heart Failure Stewardship Pharmacist Phone [phone]

## 2024-03-01 NOTE — Progress Notes (Signed)
 PROGRESS NOTE    Yashasvi Manka  ZOX:[id number] DOB: 12/11/1965 DOA: 02/28/2024 PCP: Marius Siemens, NP    57/F w COPD, chronic systolic HF, T2DM, HTN, MVR, HLD, polysubstance abuse including cocaine , tobacco use disorder presenting to the ED with [facility]. -Only intermittent compliance with meds, uses cocaine  every day and still smokes -In the ER she was hypoxic, placed on BIPAP, given nebs mag etc. labs, creatinine normal, BNP 1618. Troponins 24 > 24. UA unremarkable. CXR showing cardiomegaly with pulmonary venous HTN and mild interstitial edema.   Subjective: - Still with some cough, breathing is overall improving  Assessment and Plan:  Acute hypoxic respiratory failure  - I suspect this is primarily from CHF exacerbation  - Required BiPAP on admission, now resolved   Acute on chronic systolic HF Pulmonary hypertension MVR Last ECHO in 07/2023 with LVEF 40 to 45%, mildly decreased LV function, global hypokinesis, grade 1 diastolic dysfunction, severely elevated pulmonary artery systolic pressure, moderate to severe mitral valve regurgitation.   - Improving, 4.3 L negative, continue IV Lasix  1 more day -Continue Coreg , Farxiga , losartan , Aldactone  -Repeat echo with EF 35%, grade 1 DD, normal RV, mild MVR - Compliance, substance abuse continue to be barriers, will add home health RN at DC  COPD, ongoing tobacco use -Does not need additional systemic steroids, continue bronchodilators, breztri     T2DM with hyperglycemia -Continue Farxiga , sliding scale insulin   HLD - Resume home Lipitor 40 mg daily   Depression - Resume home Lexapro  20 mg daily   Tobacco use disorder Cocaine  use disorder - Counseled - TOC consult   DVT prophylaxis: lovenox  Code Status: Full Cpde Family Communication: None present Disposition Plan: Home [place] 48h  Consultants:    Procedures:   Antimicrobials:    Objective: Vitals:   03/01/24 0727 03/01/24 0814 03/01/24 1020 03/01/24 1104   BP: (!) 138/95   132/78  Pulse: 79 79  72  Resp: 20 19  20   Temp: 98.4 F (36.9 C)   97.7 F (36.5 C)  TempSrc: Oral   Oral  SpO2: 96% 96% 98% 94%  Weight:      Height:        Intake/Output Summary (Last 24 hours) at 03/01/2024 1206 Last data filed at 03/01/2024 1106 Gross per 24 hour  Intake 767 ml  Output 3200 ml  Net -2433 ml   Filed Weights   02/28/24 1806 02/29/24 0647 03/01/24 0409  Weight: 67.1 kg 66.6 kg 64.4 kg    Examination:  Gen: Awake, Alert, Oriented X 3,  HEENT: + JVD Lungs: Poor air movement bilaterally CVS: S1S2/RRR Abd: soft, Non tender, non distended, BS present Extremities: Trace edema Skin: no new rashes on exposed skin     Data Reviewed:   CBC: Recent Labs  Lab 02/28/24 1313 02/28/24 1603 02/29/24 0248  WBC 10.3  --  6.4  NEUTROABS 7.5  --   --   HGB 13.7 15.0 14.1  HCT 44.2 44.0 44.9  MCV 100.2*  --  95.5  PLT 239  --  255   Basic Metabolic Panel: Recent Labs  Lab 02/28/24 1313 02/28/24 1603 02/29/24 0248 03/01/24 0252  NA 142 141 141 139  K 3.7 4.0 3.6 3.9  CL 112*  --  108 100  CO2 23  --  25 29  GLUCOSE 157*  --  160* 191*  BUN 10  --  14 25*  CREATININE 0.86  --  0.88 1.22*  CALCIUM  8.8*  --  9.1 9.0  GFR: Estimated Creatinine Clearance: 42.6 mL/min (A) (by C-G formula based on SCr of 1.22 mg/dL (H)). Liver Function Tests: No results for input(s): "AST", "ALT", "ALKPHOS", "BILITOT", "PROT", "ALBUMIN" in the last 168 hours. No results for input(s): "LIPASE", "AMYLASE" in the last 168 hours. No results for input(s): "AMMONIA" in the last 168 hours. Coagulation Profile: No results for input(s): "INR", "PROTIME" in the last 168 hours. Cardiac Enzymes: No results for input(s): "CKTOTAL", "CKMB", "CKMBINDEX", "TROPONINI" in the last 168 hours. BNP (last 3 results) No results for input(s): "PROBNP" in the last 8760 hours. HbA1C: Recent Labs    02/28/24 1313  HGBA1C 5.8*   CBG: Recent Labs  Lab 02/29/24 1111  02/29/24 1551 02/29/24 2118 03/01/24 0632 03/01/24 1102  GLUCAP 166* 246* 245* 112* 152*   Lipid Profile: No results for input(s): "CHOL", "HDL", "LDLCALC", "TRIG", "CHOLHDL", "LDLDIRECT" in the last 72 hours. Thyroid  Function Tests: No results for input(s): "TSH", "T4TOTAL", "FREET4", "T3FREE", "THYROIDAB" in the last 72 hours. Anemia Panel: No results for input(s): "VITAMINB12", "FOLATE", "FERRITIN", "TIBC", "IRON", "RETICCTPCT" in the last 72 hours. Urine analysis:    Component Value Date/Time   COLORURINE YELLOW 02/28/2024 1552   APPEARANCEUR CLEAR 02/28/2024 1552   LABSPEC 1.015 02/28/2024 1552   PHURINE 6.0 02/28/2024 1552   GLUCOSEU NEGATIVE 02/28/2024 1552   HGBUR NEGATIVE 02/28/2024 1552   BILIRUBINUR NEGATIVE 02/28/2024 1552   BILIRUBINUR negative 07/09/2018 1628   KETONESUR NEGATIVE 02/28/2024 1552   PROTEINUR NEGATIVE 02/28/2024 1552   UROBILINOGEN 0.2 09/17/2018 1623   UROBILINOGEN 0.2 03/14/2014 0045   NITRITE NEGATIVE 02/28/2024 1552   LEUKOCYTESUR NEGATIVE 02/28/2024 1552   Sepsis Labs: @LABRCNTIP (procalcitonin:4,lacticidven:4)  ) Recent Results (from the past 240 hours)  Resp panel by RT-PCR (RSV, Flu A&B, Covid) Anterior Nasal Swab     Status: None   Collection Time: 02/28/24  3:12 PM   Specimen: Anterior Nasal Swab  Result Value Ref Range Status   SARS Coronavirus 2 by RT PCR NEGATIVE NEGATIVE Final   Influenza A by PCR NEGATIVE NEGATIVE Final   Influenza B by PCR NEGATIVE NEGATIVE Final    Comment: (NOTE) The Xpert Xpress SARS-CoV-2/FLU/RSV plus assay is intended as an aid in the diagnosis of influenza from Nasopharyngeal swab specimens and should not be used as a sole basis for treatment. Nasal washings and aspirates are unacceptable for Xpert Xpress SARS-CoV-2/FLU/RSV testing.  Fact Sheet for Patients: BloggerCourse.com  Fact Sheet for Healthcare Providers: SeriousBroker.it  This test is not  yet approved or cleared by the [place]  FDA and has been authorized for detection and/or diagnosis of SARS-CoV-2 by FDA under an Emergency Use Authorization (EUA). This EUA will remain in effect (meaning this test can be used) for the duration of the COVID-19 declaration under Section 564(b)(1) of the Act, 21 U.S.C. section 360bbb-3(b)(1), unless the authorization is terminated or revoked.     Resp Syncytial Virus by PCR NEGATIVE NEGATIVE Final    Comment: (NOTE) Fact Sheet for Patients: BloggerCourse.com  Fact Sheet for Healthcare Providers: SeriousBroker.it  This test is not yet approved or cleared by the [place]  FDA and has been authorized for detection and/or diagnosis of SARS-CoV-2 by FDA under an Emergency Use Authorization (EUA). This EUA will remain in effect (meaning this test can be used) for the duration of the COVID-19 declaration under Section 564(b)(1) of the Act, 21 U.S.C. section 360bbb-3(b)(1), unless the authorization is terminated or revoked.  Performed at [facility], [address] [place], [place] [address]  Respiratory (~20 pathogens) panel by PCR     Status: None   Collection Time: 02/28/24  3:12 PM   Specimen: Nasopharyngeal Swab; Respiratory  Result Value Ref Range Status   Adenovirus NOT DETECTED NOT DETECTED Final   Coronavirus 229E NOT DETECTED NOT DETECTED Final    Comment: (NOTE) The Coronavirus on the Respiratory Panel, DOES NOT test for the novel  Coronavirus (2019 nCoV)    Coronavirus HKU1 NOT DETECTED NOT DETECTED Final   Coronavirus NL63 NOT DETECTED NOT DETECTED Final   Coronavirus OC43 NOT DETECTED NOT DETECTED Final   Metapneumovirus NOT DETECTED NOT DETECTED Final   Rhinovirus / Enterovirus NOT DETECTED NOT DETECTED Final   Influenza A NOT DETECTED NOT DETECTED Final   Influenza B NOT DETECTED NOT DETECTED Final   Parainfluenza Virus 1 NOT DETECTED NOT DETECTED  Final   Parainfluenza Virus 2 NOT DETECTED NOT DETECTED Final   Parainfluenza Virus 3 NOT DETECTED NOT DETECTED Final   Parainfluenza Virus 4 NOT DETECTED NOT DETECTED Final   Respiratory Syncytial Virus NOT DETECTED NOT DETECTED Final   Bordetella pertussis NOT DETECTED NOT DETECTED Final   Bordetella Parapertussis NOT DETECTED NOT DETECTED Final   Chlamydophila pneumoniae NOT DETECTED NOT DETECTED Final   Mycoplasma pneumoniae NOT DETECTED NOT DETECTED Final    Comment: Performed at [facility], [address], [place], [place] [address]     Radiology Studies: ECHOCARDIOGRAM COMPLETE Result Date: 02/29/2024    ECHOCARDIOGRAM REPORT   Patient Name:   Arcadia Botsford Date of Exam: 02/29/2024 Medical Rec #:  [id number]       Height:       61.0 in Accession #:    [id number]      Weight:       146.8 lb Date of Birth:  January 21, 1966       BSA:          1.656 m Patient Age:    57 years        BP:           122/73 mmHg Patient Gender: F               HR:           68 bpm. Exam Location:  Inpatient Procedure: 2D Echo, Cardiac Doppler and Color Doppler (Both Spectral and Color            Flow Doppler were utilized during procedure). Indications:    CHF  History:        Patient has prior history of Echocardiogram examinations, most                 recent 07/26/2023. CHF, COPD; Risk Factors:Hypertension and                 Diabetes.  Sonographer:    Janette Medley Referring Phys: [id number] SAGAR H JINWALA IMPRESSIONS  1. Left ventricular ejection fraction, by estimation, is 30 to 35%. Left ventricular ejection fraction by PLAX is 32 %. The left ventricle has moderately decreased function. The left ventricle demonstrates global hypokinesis. Left ventricular diastolic parameters are consistent with Grade I diastolic dysfunction (impaired relaxation).  2. Right ventricular systolic function is normal. The right ventricular size is normal. There is normal pulmonary artery systolic pressure.  3. Left atrial size was  mildly dilated.  4. The mitral valve is degenerative. Mild mitral valve regurgitation. Mild mitral stenosis. The mean mitral valve gradient is 4.7 mmHg with average heart rate of 69  bpm.  5. The aortic valve is tricuspid. Aortic valve regurgitation is mild to moderate. No aortic stenosis is present.  6. The inferior vena cava is normal in size with greater than 50% respiratory variability, suggesting right atrial pressure of 3 mmHg. FINDINGS  Left Ventricle: Left ventricular ejection fraction, by estimation, is 30 to 35%. Left ventricular ejection fraction by PLAX is 32 %. The left ventricle has moderately decreased function. The left ventricle demonstrates global hypokinesis. The left ventricular internal cavity size was normal in size. There is no left ventricular hypertrophy. Left ventricular diastolic parameters are consistent with Grade I diastolic dysfunction (impaired relaxation). Right Ventricle: The right ventricular size is normal. No increase in right ventricular wall thickness. Right ventricular systolic function is normal. There is normal pulmonary artery systolic pressure. The tricuspid regurgitant velocity is 2.19 m/s, and  with an assumed right atrial pressure of 3 mmHg, the estimated right ventricular systolic pressure is 22.2 mmHg. Left Atrium: Left atrial size was mildly dilated. Right Atrium: Right atrial size was normal in size. Pericardium: There is no evidence of pericardial effusion. Mitral Valve: The mitral valve is degenerative in appearance. There is mild thickening of the mitral valve leaflet(s). There is mild calcification of the mitral valve leaflet(s). Mild mitral valve regurgitation. Mild mitral valve stenosis. MV peak gradient, 9.9 mmHg. The mean mitral valve gradient is 4.7 mmHg with average heart rate of 69 bpm. Tricuspid Valve: The tricuspid valve is normal in structure. Tricuspid valve regurgitation is trivial. No evidence of tricuspid stenosis. Aortic Valve: The aortic valve is  tricuspid. Aortic valve regurgitation is mild to moderate. No aortic stenosis is present. Pulmonic Valve: The pulmonic valve was normal in structure. Pulmonic valve regurgitation is trivial. No evidence of pulmonic stenosis. Aorta: The aortic root is normal in size and structure. Venous: The inferior vena cava is normal in size with greater than 50% respiratory variability, suggesting right atrial pressure of 3 mmHg. IAS/Shunts: No atrial level shunt detected by color flow Doppler.  LEFT VENTRICLE PLAX 2D LV EF:         Left ventricular ejection fraction by PLAX is 32 %. LVIDd:         5.90 cm LVIDs:         5.00 cm LV PW:         1.55 cm LV IVS:        0.85 cm LVOT diam:     1.80 cm LV SV:         44 LV SV Index:   27 LVOT Area:     2.54 cm  RIGHT VENTRICLE            IVC RV S prime:     8.27 cm/s  IVC diam: 1.40 cm TAPSE (M-mode): 2.2 cm LEFT ATRIUM           Index        RIGHT ATRIUM           Index LA diam:      4.00 cm 2.42 cm/m   RA Area:     18.50 cm LA Vol (A2C): 83.3 ml 50.29 ml/m  RA Volume:   43.10 ml  26.02 ml/m LA Vol (A4C): 54.3 ml 32.78 ml/m  AORTIC VALVE LVOT Vmax:   97.35 cm/s LVOT Vmean:  64.450 cm/s LVOT VTI:    0.173 m  AORTA Ao Root diam: 2.80 cm Ao Asc diam:  3.20 cm MITRAL VALVE  TRICUSPID VALVE MV Area (PHT): 3.70 cm     TR Peak grad:   19.2 mmHg MV Peak grad:  9.9 mmHg     TR Vmax:        219.00 cm/s MV Mean grad:  4.7 mmHg MV Vmax:       1.58 m/s     SHUNTS MV Vmean:      101.3 cm/s   Systemic VTI:  0.17 m MV Decel Time: 205 msec     Systemic Diam: 1.80 cm MR Peak grad: 102.4 mmHg MR Mean grad: 74.0 mmHg MR Vmax:      506.00 cm/s MR Vmean:     412.0 cm/s MV E velocity: 107.00 cm/s Maudine Sos MD Electronically signed by Maudine Sos MD Signature Date/Time: 02/29/2024/4:33:06 PM    Final    DG Chest Port 1 View Result Date: 02/28/2024 CLINICAL DATA:  Shortness of breath EXAM: PORTABLE CHEST 1 VIEW COMPARISON:  11/21/2023 FINDINGS: Atherosclerotic calcification  of the aortic arch. Moderate enlargement of the cardiopericardial silhouette similar to prior. Indistinct pulmonary vasculature suspicious for pulmonary venous hypertension. Faint Kerley B lines observed at the right lung base favoring mild interstitial edema. Mild lower thoracic spondylosis. IMPRESSION: 1. Moderate enlargement of the cardiopericardial silhouette with pulmonary venous hypertension and mild interstitial edema. 2. Aortic Atherosclerosis (ICD10-I70.0). Electronically Signed   By: Freida Jes M.D.   On: 02/28/2024 14:36     Scheduled Meds:  atorvastatin   40 mg Oral Daily   budeson-glycopyrrolate -formoterol   2 puff Inhalation BID   dapagliflozin  propanediol  10 mg Oral Daily   enoxaparin  (LOVENOX ) injection  40 mg Subcutaneous Q24H   escitalopram   20 mg Oral Daily   furosemide   40 mg Intravenous BID   gabapentin   300 mg Oral BID   losartan   25 mg Oral Daily   spironolactone   25 mg Oral Daily   Continuous Infusions:   LOS: 1 day    Time spent:    Deforest Fast, MD [facility]   03/01/2024, 12:06 PM

## 2024-03-01 NOTE — TOC Progression Note (Addendum)
 Transition of Care [facility]) - Progression Note    Patient Details  Name: Stacey Lin MRN: [id number] Date of Birth: [birth year]  Transition [facility]) CM/SW Contact  Jennett Model, RN Phone Number: 03/01/2024, 11:49 AM  Clinical Narrative:    From home with her brother, she has a home health aide to assist her, she does not have any DME,   she states she gets her medications prepackaged from [facility].  She states she has Family Support.  She will need a cab at dc.  NCM offered choice for [facility] for disease management.  She does not have a preference,  NCM made referral to [facility] with [place].  He is able to take referral.  Soc will begin 24 to 48 hrs post dc.          Expected Discharge Plan and Services                                               Social Determinants of Health (SDOH) Interventions SDOH Screenings   Food Insecurity: Food Insecurity Present (02/28/2024)  Housing: High Risk (02/28/2024)  Transportation Needs: Unmet Transportation Needs (02/28/2024)  Utilities: At Risk (02/28/2024)  Depression (PHQ2-9): High Risk (10/19/2023)  Financial Resource Strain: High Risk (12/21/2023)  Physical Activity: Unknown (10/18/2023)  Recent Concern: Physical Activity - Inactive (08/02/2023)  Social Connections: Moderately Isolated (11/22/2023)  Stress: Stress Concern Present (10/18/2023)  Tobacco Use: Medium Risk (02/28/2024)    Readmission Risk Interventions    04/13/2022   12:21 PM  Readmission Risk Prevention Plan  Transportation Screening Complete  PCP or Specialist Appt within 3-5 Days Complete  HRI or Home Care Consult Complete  Social Work Consult for Recovery Care Planning/Counseling Complete  Palliative Care Screening Not Applicable  Medication Review Oceanographer) Complete

## 2024-03-01 NOTE — Inpatient Diabetes Management (Signed)
 Inpatient Diabetes Program Recommendations  AACE/ADA: New Consensus Statement on Inpatient Glycemic Control (2015)  Target Ranges:  Prepandial:   less than 140 mg/dL      Peak postprandial:   less than 180 mg/dL (1-2 hours)      Critically ill patients:  140 - 180 mg/dL   Lab Results  Component Value Date   GLUCAP 152 (H) 03/01/2024   HGBA1C 5.8 (H) 02/28/2024    Review of Glycemic Control  Diabetes history: DM2 Outpatient Diabetes medications: Farxiga  10 mg daily, Metformin  500 mg daily Current orders for Inpatient glycemic control: Farxiga  10 mg daily  Inpatient Diabetes Program Recommendations:   Spoke with patient at the bedside. Patient very sleepy, but responded to my voice. She states that she just got pain medication. Asked if she takes Farxiga  and Metformin  at home for her diabetes and she stated that it comes prepackaged. Not sure if patient is taking medications consistently at home. Patient was very sleepy.   Will continue to monitor blood sugars while in the hospital. Will follow up when patient is more awake.   Nick Barman RN BSN CDE Diabetes Coordinator Pager: [phone]  8am-5pm

## 2024-03-01 NOTE — Plan of Care (Signed)
  Problem: Coping: Goal: Ability to adjust to condition or change in health will improve Outcome: Progressing   Problem: Health Behavior/Discharge Planning: Goal: Ability to identify and utilize available resources and services will improve Outcome: Progressing   Problem: Metabolic: Goal: Ability to maintain appropriate glucose levels will improve Outcome: Progressing

## 2024-03-02 DIAGNOSIS — J441 Chronic obstructive pulmonary disease with (acute) exacerbation: Secondary | ICD-10-CM | POA: Diagnosis not present

## 2024-03-02 LAB — BASIC METABOLIC PANEL WITH GFR
Anion gap: 10 (ref 5–15)
BUN: 30 mg/dL — ABNORMAL HIGH (ref 6–20)
CO2: 30 mmol/L (ref 22–32)
Calcium: 9.1 mg/dL (ref 8.9–10.3)
Chloride: 98 mmol/L (ref 98–111)
Creatinine, Ser: 1.16 mg/dL — ABNORMAL HIGH (ref 0.44–1.00)
GFR, Estimated: 54 mL/min — ABNORMAL LOW (ref >60)
Glucose, Bld: 143 mg/dL — ABNORMAL HIGH (ref 70–99)
Potassium: 4.2 mmol/L (ref 3.5–5.1)
Sodium: 138 mmol/L (ref 135–145)

## 2024-03-02 LAB — GLUCOSE, CAPILLARY
Glucose-Capillary: 239 mg/dL — ABNORMAL HIGH (ref 70–99)
Glucose-Capillary: 121 mg/dL — ABNORMAL HIGH (ref 70–99)
Glucose-Capillary: 355 mg/dL — ABNORMAL HIGH (ref 70–99)
Glucose-Capillary: 216 mg/dL — ABNORMAL HIGH (ref 70–99)

## 2024-03-02 LAB — MAGNESIUM: Magnesium: 2.5 mg/dL — ABNORMAL HIGH (ref 1.7–2.4)

## 2024-03-02 MED ORDER — PREDNISONE 20 MG PO TABS
40.0000 mg | ORAL_TABLET | Freq: Every day | ORAL | Status: DC
  Administered 2024-03-02 – 2024-03-03 (×2): 40 mg via ORAL
  Filled 2024-03-02 (×2): qty 2

## 2024-03-02 MED ORDER — FUROSEMIDE 10 MG/ML IJ SOLN
40.0000 mg | Freq: Two times a day (BID) | INTRAMUSCULAR | Status: AC
  Administered 2024-03-02: 40 mg via INTRAVENOUS
  Filled 2024-03-02: qty 4

## 2024-03-02 MED ORDER — INSULIN GLARGINE-YFGN 100 UNIT/ML ~~LOC~~ SOLN
10.0000 [IU] | Freq: Every day | SUBCUTANEOUS | Status: DC
  Administered 2024-03-02: 10 [IU] via SUBCUTANEOUS
  Filled 2024-03-02 (×2): qty 0.1

## 2024-03-02 MED ORDER — INSULIN ASPART 100 UNIT/ML IJ SOLN
3.0000 [IU] | Freq: Three times a day (TID) | INTRAMUSCULAR | Status: DC
  Administered 2024-03-02 – 2024-03-03 (×2): 3 [IU] via SUBCUTANEOUS

## 2024-03-02 MED ORDER — INSULIN ASPART 100 UNIT/ML IJ SOLN
0.0000 [IU] | Freq: Three times a day (TID) | INTRAMUSCULAR | Status: DC
  Administered 2024-03-03: 3 [IU] via SUBCUTANEOUS

## 2024-03-02 NOTE — Plan of Care (Signed)
 Pt A/OX4, VSS. No c/o pain. BG inconsistent; attempting to control it with long acting and short. Adequate UO, no BM noted. Minimal intake; pt does not like the hospital food. Bed alarm on. 1 PIV placed in L FA; SL. Call light within reach.   Problem: Education: Goal: Ability to describe self-care measures that may prevent or decrease complications (Diabetes Survival Skills Education) will improve Outcome: Progressing Goal: Individualized Educational Video(s) Outcome: Progressing   Problem: Coping: Goal: Ability to adjust to condition or change in health will improve Outcome: Progressing   Problem: Fluid Volume: Goal: Ability to maintain a balanced intake and output will improve Outcome: Progressing   Problem: Health Behavior/Discharge Planning: Goal: Ability to identify and utilize available resources and services will improve Outcome: Progressing Goal: Ability to manage health-related needs will improve Outcome: Progressing   Problem: Metabolic: Goal: Ability to maintain appropriate glucose levels will improve Outcome: Progressing   Problem: Nutritional: Goal: Maintenance of adequate nutrition will improve Outcome: Progressing Goal: Progress toward achieving an optimal weight will improve Outcome: Progressing   Problem: Skin Integrity: Goal: Risk for impaired skin integrity will decrease Outcome: Progressing   Problem: Tissue Perfusion: Goal: Adequacy of tissue perfusion will improve Outcome: Progressing   Problem: Education: Goal: Knowledge of General Education information will improve Description: Including pain rating scale, medication(s)/side effects and non-pharmacologic comfort measures Outcome: Progressing   Problem: Health Behavior/Discharge Planning: Goal: Ability to manage health-related needs will improve Outcome: Progressing   Problem: Clinical Measurements: Goal: Ability to maintain clinical measurements within normal limits will improve Outcome:  Progressing Goal: Will remain free from infection Outcome: Progressing Goal: Diagnostic test results will improve Outcome: Progressing Goal: Respiratory complications will improve Outcome: Progressing Goal: Cardiovascular complication will be avoided Outcome: Progressing   Problem: Activity: Goal: Risk for activity intolerance will decrease Outcome: Progressing   Problem: Nutrition: Goal: Adequate nutrition will be maintained Outcome: Progressing   Problem: Coping: Goal: Level of anxiety will decrease Outcome: Progressing   Problem: Elimination: Goal: Will not experience complications related to bowel motility Outcome: Progressing Goal: Will not experience complications related to urinary retention Outcome: Progressing   Problem: Pain Managment: Goal: General experience of comfort will improve and/or be controlled Outcome: Progressing   Problem: Safety: Goal: Ability to remain free from injury will improve Outcome: Progressing   Problem: Skin Integrity: Goal: Risk for impaired skin integrity will decrease Outcome: Progressing   Problem: Education: Goal: Ability to demonstrate management of disease process will improve Outcome: Progressing Goal: Ability to verbalize understanding of medication therapies will improve Outcome: Progressing Goal: Individualized Educational Video(s) Outcome: Progressing   Problem: Activity: Goal: Capacity to carry out activities will improve Outcome: Progressing   Problem: Cardiac: Goal: Ability to achieve and maintain adequate cardiopulmonary perfusion will improve Outcome: Progressing   Problem: Education: Goal: Knowledge of disease or condition will improve Outcome: Progressing Goal: Knowledge of the prescribed therapeutic regimen will improve Outcome: Progressing Goal: Individualized Educational Video(s) Outcome: Progressing   Problem: Activity: Goal: Ability to tolerate increased activity will improve Outcome:  Progressing Goal: Will verbalize the importance of balancing activity with adequate rest periods Outcome: Progressing   Problem: Respiratory: Goal: Ability to maintain a clear airway will improve Outcome: Progressing Goal: Levels of oxygenation will improve Outcome: Progressing Goal: Ability to maintain adequate ventilation will improve Outcome: Progressing

## 2024-03-02 NOTE — Progress Notes (Signed)
 PROGRESS NOTE    Stacey Lin  JYN:[id number] DOB: 04-Dec-1965 DOA: 02/28/2024 PCP: Marius Siemens, NP    57/F w COPD, chronic systolic HF, T2DM, HTN, MVR, HLD, polysubstance abuse including cocaine , tobacco use disorder presenting to the ED with [facility]. -Only intermittent compliance with meds, uses cocaine  every day and still smokes -In the ER she was hypoxic, placed on BIPAP, given nebs mag etc. labs, creatinine normal, BNP 1618. Troponins 24 > 24. UA unremarkable. CXR showing cardiomegaly with pulmonary venous HTN and mild interstitial edema.   Subjective: - Still with some cough, mild wheezing, breathing overall improving  Assessment and Plan:  Acute hypoxic respiratory failure  - I suspect this is primarily from CHF exacerbation  - Required BiPAP on admission, now resolved   Acute on chronic systolic HF Pulmonary hypertension MVR Last ECHO in 07/2023 with LVEF 40 to 45%, mildly decreased LV function, global hypokinesis, grade 1 diastolic dysfunction, severely elevated pulmonary artery systolic pressure, moderate to severe mitral valve regurgitation.   - Improving, 6.8 L negative, continue IV Lasix  1 more day transition to oral diuretics tomorrow  -Continue Coreg , Farxiga , losartan , Aldactone  -Repeat echo with EF 35%, grade 1 DD, normal RV, mild MVR - Compliance, substance abuse continue to be barriers, will add home health RN at [place]  COPD, ongoing tobacco use - Productive cough and some wheezing noted, add prednisone  today, continue bronchodilators, breztri     T2DM with hyperglycemia -Continue Farxiga , sliding scale insulin   HLD - Resume home Lipitor 40 mg daily   Depression - Resume home Lexapro  20 mg daily   Tobacco use disorder Cocaine  use disorder - Counseled - TOC consult   DVT prophylaxis: lovenox  Code Status: Full Cpde Family Communication: None present Disposition Plan: Home [place] tomorrow  Consultants:    Procedures:   Antimicrobials:     Objective: Vitals:   03/01/24 2033 03/02/24 0035 03/02/24 0502 03/02/24 0844  BP: 118/82 123/89 119/79 101/69  Pulse: 83 76 74 80  Resp: 18 18 18 20   Temp: 98.4 F (36.9 C) 97.9 F (36.6 C) 97.6 F (36.4 C) 97.7 F (36.5 C)  TempSrc: Oral Oral Oral Oral  SpO2: 93% 91% 95% 92%  Weight:   62.8 kg   Height:        Intake/Output Summary (Last 24 hours) at 03/02/2024 1115 Last data filed at 03/02/2024 0700 Gross per 24 hour  Intake 717 ml  Output 3150 ml  Net -2433 ml   Filed Weights   02/29/24 0647 03/01/24 0409 03/02/24 0502  Weight: 66.6 kg 64.4 kg 62.8 kg    Examination:  Gen: AO x 3, poor insight HEENT: Positive JVD CVS: S1-S2, regular rhythm Lungs: Improving air movement, few base Rales, few wheezes  abd: soft, Non tender, non distended, BS present Extremities: Trace edema Skin: no new rashes on exposed skin     Data Reviewed:   CBC: Recent Labs  Lab 02/28/24 1313 02/28/24 1603 02/29/24 0248  WBC 10.3  --  6.4  NEUTROABS 7.5  --   --   HGB 13.7 15.0 14.1  HCT 44.2 44.0 44.9  MCV 100.2*  --  95.5  PLT 239  --  255   Basic Metabolic Panel: Recent Labs  Lab 02/28/24 1313 02/28/24 1603 02/29/24 0248 03/01/24 0252 03/02/24 0257  NA 142 141 141 139 138  K 3.7 4.0 3.6 3.9 4.2  CL 112*  --  108 100 98  CO2 23  --  25 29 30   GLUCOSE 157*  --  160* 191* 143*  BUN 10  --  14 25* 30*  CREATININE 0.86  --  0.88 1.22* 1.16*  CALCIUM  8.8*  --  9.1 9.0 9.1  MG  --   --   --   --  2.5*   GFR: Estimated Creatinine Clearance: 44.4 mL/min (A) (by C-G formula based on SCr of 1.16 mg/dL (H)). Liver Function Tests: No results for input(s): "AST", "ALT", "ALKPHOS", "BILITOT", "PROT", "ALBUMIN" in the last 168 hours. No results for input(s): "LIPASE", "AMYLASE" in the last 168 hours. No results for input(s): "AMMONIA" in the last 168 hours. Coagulation Profile: No results for input(s): "INR", "PROTIME" in the last 168 hours. Cardiac Enzymes: No results  for input(s): "CKTOTAL", "CKMB", "CKMBINDEX", "TROPONINI" in the last 168 hours. BNP (last 3 results) No results for input(s): "PROBNP" in the last 8760 hours. HbA1C: Recent Labs    02/28/24 1313  HGBA1C 5.8*   CBG: Recent Labs  Lab 03/01/24 1102 03/01/24 1606 03/01/24 2057 03/02/24 0627 03/02/24 1102  GLUCAP 152* 202* 210* 239* 121*   Lipid Profile: No results for input(s): "CHOL", "HDL", "LDLCALC", "TRIG", "CHOLHDL", "LDLDIRECT" in the last 72 hours. Thyroid  Function Tests: No results for input(s): "TSH", "T4TOTAL", "FREET4", "T3FREE", "THYROIDAB" in the last 72 hours. Anemia Panel: No results for input(s): "VITAMINB12", "FOLATE", "FERRITIN", "TIBC", "IRON", "RETICCTPCT" in the last 72 hours. Urine analysis:    Component Value Date/Time   COLORURINE YELLOW 02/28/2024 1552   APPEARANCEUR CLEAR 02/28/2024 1552   LABSPEC 1.015 02/28/2024 1552   PHURINE 6.0 02/28/2024 1552   GLUCOSEU NEGATIVE 02/28/2024 1552   HGBUR NEGATIVE 02/28/2024 1552   BILIRUBINUR NEGATIVE 02/28/2024 1552   BILIRUBINUR negative 07/09/2018 1628   KETONESUR NEGATIVE 02/28/2024 1552   PROTEINUR NEGATIVE 02/28/2024 1552   UROBILINOGEN 0.2 09/17/2018 1623   UROBILINOGEN 0.2 03/14/2014 0045   NITRITE NEGATIVE 02/28/2024 1552   LEUKOCYTESUR NEGATIVE 02/28/2024 1552   Sepsis Labs: @LABRCNTIP (procalcitonin:4,lacticidven:4)  ) Recent Results (from the past 240 hours)  Resp panel by RT-PCR (RSV, Flu A&B, Covid) Anterior Nasal Swab     Status: None   Collection Time: 02/28/24  3:12 PM   Specimen: Anterior Nasal Swab  Result Value Ref Range Status   SARS Coronavirus 2 by RT PCR NEGATIVE NEGATIVE Final   Influenza A by PCR NEGATIVE NEGATIVE Final   Influenza B by PCR NEGATIVE NEGATIVE Final    Comment: (NOTE) The Xpert Xpress SARS-CoV-2/FLU/RSV plus assay is intended as an aid in the diagnosis of influenza from Nasopharyngeal swab specimens and should not be used as a sole basis for treatment. Nasal  washings and aspirates are unacceptable for Xpert Xpress SARS-CoV-2/FLU/RSV testing.  Fact Sheet for Patients: BloggerCourse.com  Fact Sheet for Healthcare Providers: SeriousBroker.it  This test is not yet approved or cleared by the [place]  FDA and has been authorized for detection and/or diagnosis of SARS-CoV-2 by FDA under an Emergency Use Authorization (EUA). This EUA will remain in effect (meaning this test can be used) for the duration of the COVID-19 declaration under Section 564(b)(1) of the Act, 21 U.S.C. section 360bbb-3(b)(1), unless the authorization is terminated or revoked.     Resp Syncytial Virus by PCR NEGATIVE NEGATIVE Final    Comment: (NOTE) Fact Sheet for Patients: BloggerCourse.com  Fact Sheet for Healthcare Providers: SeriousBroker.it  This test is not yet approved or cleared by the [place]  FDA and has been authorized for detection and/or diagnosis of SARS-CoV-2 by FDA under an Emergency Use Authorization (EUA). This EUA will  remain in effect (meaning this test can be used) for the duration of the COVID-19 declaration under Section 564(b)(1) of the Act, 21 U.S.C. section 360bbb-3(b)(1), unless the authorization is terminated or revoked.  Performed at [facility], [address], [place], [place] [address]   Respiratory (~20 pathogens) panel by PCR     Status: None   Collection Time: 02/28/24  3:12 PM   Specimen: Nasopharyngeal Swab; Respiratory  Result Value Ref Range Status   Adenovirus NOT DETECTED NOT DETECTED Final   Coronavirus 229E NOT DETECTED NOT DETECTED Final    Comment: (NOTE) The Coronavirus on the Respiratory Panel, DOES NOT test for the novel  Coronavirus (2019 nCoV)    Coronavirus HKU1 NOT DETECTED NOT DETECTED Final   Coronavirus NL63 NOT DETECTED NOT DETECTED Final   Coronavirus OC43 NOT DETECTED NOT DETECTED  Final   Metapneumovirus NOT DETECTED NOT DETECTED Final   Rhinovirus / Enterovirus NOT DETECTED NOT DETECTED Final   Influenza A NOT DETECTED NOT DETECTED Final   Influenza B NOT DETECTED NOT DETECTED Final   Parainfluenza Virus 1 NOT DETECTED NOT DETECTED Final   Parainfluenza Virus 2 NOT DETECTED NOT DETECTED Final   Parainfluenza Virus 3 NOT DETECTED NOT DETECTED Final   Parainfluenza Virus 4 NOT DETECTED NOT DETECTED Final   Respiratory Syncytial Virus NOT DETECTED NOT DETECTED Final   Bordetella pertussis NOT DETECTED NOT DETECTED Final   Bordetella Parapertussis NOT DETECTED NOT DETECTED Final   Chlamydophila pneumoniae NOT DETECTED NOT DETECTED Final   Mycoplasma pneumoniae NOT DETECTED NOT DETECTED Final    Comment: Performed at [facility], [address] [place], [place] [address]     Radiology Studies: ECHOCARDIOGRAM COMPLETE Result Date: 02/29/2024    ECHOCARDIOGRAM REPORT   Patient Name:   Stacey Lin Date of Exam: 02/29/2024 Medical Rec #:  [id number]       Height:       61.0 in Accession #:    [id number]      Weight:       146.8 lb Date of Birth:  1965-11-14       BSA:          1.656 m Patient Age:    57 years        BP:           122/73 mmHg Patient Gender: F               HR:           68 bpm. Exam Location:  Inpatient Procedure: 2D Echo, Cardiac Doppler and Color Doppler (Both Spectral and Color            Flow Doppler were utilized during procedure). Indications:    CHF  History:        Patient has prior history of Echocardiogram examinations, most                 recent 07/26/2023. CHF, COPD; Risk Factors:Hypertension and                 Diabetes.  Sonographer:    Janette Medley Referring Phys: [id number] SAGAR H JINWALA IMPRESSIONS  1. Left ventricular ejection fraction, by estimation, is 30 to 35%. Left ventricular ejection fraction by PLAX is 32 %. The left ventricle has moderately decreased function. The left ventricle demonstrates global hypokinesis. Left ventricular  diastolic parameters are consistent with Grade I diastolic dysfunction (impaired relaxation).  2. Right ventricular systolic function is normal.  The right ventricular size is normal. There is normal pulmonary artery systolic pressure.  3. Left atrial size was mildly dilated.  4. The mitral valve is degenerative. Mild mitral valve regurgitation. Mild mitral stenosis. The mean mitral valve gradient is 4.7 mmHg with average heart rate of 69 bpm.  5. The aortic valve is tricuspid. Aortic valve regurgitation is mild to moderate. No aortic stenosis is present.  6. The inferior vena cava is normal in size with greater than 50% respiratory variability, suggesting right atrial pressure of 3 mmHg. FINDINGS  Left Ventricle: Left ventricular ejection fraction, by estimation, is 30 to 35%. Left ventricular ejection fraction by PLAX is 32 %. The left ventricle has moderately decreased function. The left ventricle demonstrates global hypokinesis. The left ventricular internal cavity size was normal in size. There is no left ventricular hypertrophy. Left ventricular diastolic parameters are consistent with Grade I diastolic dysfunction (impaired relaxation). Right Ventricle: The right ventricular size is normal. No increase in right ventricular wall thickness. Right ventricular systolic function is normal. There is normal pulmonary artery systolic pressure. The tricuspid regurgitant velocity is 2.19 m/s, and  with an assumed right atrial pressure of 3 mmHg, the estimated right ventricular systolic pressure is 22.2 mmHg. Left Atrium: Left atrial size was mildly dilated. Right Atrium: Right atrial size was normal in size. Pericardium: There is no evidence of pericardial effusion. Mitral Valve: The mitral valve is degenerative in appearance. There is mild thickening of the mitral valve leaflet(s). There is mild calcification of the mitral valve leaflet(s). Mild mitral valve regurgitation. Mild mitral valve stenosis. MV peak gradient,  9.9 mmHg. The mean mitral valve gradient is 4.7 mmHg with average heart rate of 69 bpm. Tricuspid Valve: The tricuspid valve is normal in structure. Tricuspid valve regurgitation is trivial. No evidence of tricuspid stenosis. Aortic Valve: The aortic valve is tricuspid. Aortic valve regurgitation is mild to moderate. No aortic stenosis is present. Pulmonic Valve: The pulmonic valve was normal in structure. Pulmonic valve regurgitation is trivial. No evidence of pulmonic stenosis. Aorta: The aortic root is normal in size and structure. Venous: The inferior vena cava is normal in size with greater than 50% respiratory variability, suggesting right atrial pressure of 3 mmHg. IAS/Shunts: No atrial level shunt detected by color flow Doppler.  LEFT VENTRICLE PLAX 2D LV EF:         Left ventricular ejection fraction by PLAX is 32 %. LVIDd:         5.90 cm LVIDs:         5.00 cm LV PW:         1.55 cm LV IVS:        0.85 cm LVOT diam:     1.80 cm LV SV:         44 LV SV Index:   27 LVOT Area:     2.54 cm  RIGHT VENTRICLE            IVC RV S prime:     8.27 cm/s  IVC diam: 1.40 cm TAPSE (M-mode): 2.2 cm LEFT ATRIUM           Index        RIGHT ATRIUM           Index LA diam:      4.00 cm 2.42 cm/m   RA Area:     18.50 cm LA Vol (A2C): 83.3 ml 50.29 ml/m  RA Volume:   43.10 ml  26.02 ml/m LA Vol (  A4C): 54.3 ml 32.78 ml/m  AORTIC VALVE LVOT Vmax:   97.35 cm/s LVOT Vmean:  64.450 cm/s LVOT VTI:    0.173 m  AORTA Ao Root diam: 2.80 cm Ao Asc diam:  3.20 cm MITRAL VALVE                TRICUSPID VALVE MV Area (PHT): 3.70 cm     TR Peak grad:   19.2 mmHg MV Peak grad:  9.9 mmHg     TR Vmax:        219.00 cm/s MV Mean grad:  4.7 mmHg MV Vmax:       1.58 m/s     SHUNTS MV Vmean:      101.3 cm/s   Systemic VTI:  0.17 m MV Decel Time: 205 msec     Systemic Diam: 1.80 cm MR Peak grad: 102.4 mmHg MR Mean grad: 74.0 mmHg MR Vmax:      506.00 cm/s MR Vmean:     412.0 cm/s MV E velocity: 107.00 cm/s Maudine Sos MD Electronically  signed by Maudine Sos MD Signature Date/Time: 02/29/2024/4:33:06 PM    Final      Scheduled Meds:  atorvastatin   40 mg Oral Daily   budeson-glycopyrrolate -formoterol   2 puff Inhalation BID   dapagliflozin  propanediol  10 mg Oral Daily   enoxaparin  (LOVENOX ) injection  40 mg Subcutaneous Q24H   escitalopram   20 mg Oral Daily   furosemide   40 mg Intravenous BID   gabapentin   300 mg Oral BID   losartan   25 mg Oral Daily   predniSONE   40 mg Oral Q breakfast   spironolactone   25 mg Oral Daily   Continuous Infusions:   LOS: 2 days    Time spent:    Deforest Fast, MD [facility]   03/02/2024, 11:15 AM

## 2024-03-03 DIAGNOSIS — J441 Chronic obstructive pulmonary disease with (acute) exacerbation: Secondary | ICD-10-CM | POA: Diagnosis not present

## 2024-03-03 LAB — BASIC METABOLIC PANEL WITH GFR
Anion gap: 16 — ABNORMAL HIGH (ref 5–15)
BUN: 38 mg/dL — ABNORMAL HIGH (ref 6–20)
CO2: 25 mmol/L (ref 22–32)
Calcium: 9.6 mg/dL (ref 8.9–10.3)
Chloride: 93 mmol/L — ABNORMAL LOW (ref 98–111)
Creatinine, Ser: 1.24 mg/dL — ABNORMAL HIGH (ref 0.44–1.00)
GFR, Estimated: 50 mL/min — ABNORMAL LOW (ref >60)
Glucose, Bld: 222 mg/dL — ABNORMAL HIGH (ref 70–99)
Potassium: 4.7 mmol/L (ref 3.5–5.1)
Sodium: 134 mmol/L — ABNORMAL LOW (ref 135–145)

## 2024-03-03 LAB — GLUCOSE, CAPILLARY: Glucose-Capillary: 151 mg/dL — ABNORMAL HIGH (ref 70–99)

## 2024-03-03 MED ORDER — DAPAGLIFLOZIN PROPANEDIOL 10 MG PO TABS: 10.0000 mg | ORAL_TABLET | Freq: Every day | ORAL | 1 refills | Status: DC

## 2024-03-03 MED ORDER — IPRATROPIUM-ALBUTEROL 0.5-2.5 (3) MG/3ML IN SOLN: 3.0000 mL | Freq: Two times a day (BID) | RESPIRATORY_TRACT | 1 refills | Status: AC

## 2024-03-03 MED ORDER — FUROSEMIDE 40 MG PO TABS: 40.0000 mg | ORAL_TABLET | Freq: Every day | ORAL | 1 refills | Status: DC

## 2024-03-03 MED ORDER — ALBUTEROL SULFATE HFA 108 (90 BASE) MCG/ACT IN AERS: 2.0000 | INHALATION_SPRAY | RESPIRATORY_TRACT | 1 refills | Status: DC | PRN

## 2024-03-03 MED ORDER — PREDNISONE 20 MG PO TABS: ORAL_TABLET | ORAL | 0 refills | Status: DC

## 2024-03-03 NOTE — Discharge Summary (Signed)
 Physician Discharge Summary  Marlayna Sinex ZOX:[id number] DOB: 11-25-1965 DOA: 02/28/2024  PCP: Marius Siemens, NP  Admit date: 02/28/2024 Discharge date: 03/03/2024  Time spent: 45 minutes  Recommendations for Outpatient Follow-up:  Send message to request follow-up in CHF clinic Needs BMP in 1 week Home health nursing   Discharge Diagnoses:  Acute hypoxic respiratory failure Acute on chronic systolic CHF Pulmonary hypertension Mitral regurgitation COPD Polysubstance abuse Ongoing tobacco use Cocaine  use Type 2 diabetes mellitus Depression   Discharge Condition: Improved  Diet recommendation: Low-sodium, heart healthy  Filed Weights   03/01/24 0409 03/02/24 0502 03/03/24 0610  Weight: 64.4 kg 62.8 kg 62.3 kg    History of present illness:   57/F w COPD, chronic systolic HF, T2DM, HTN, MVR, HLD, polysubstance abuse including cocaine , tobacco use disorder presenting to the ED with [facility]. -Only intermittent compliance with meds, uses cocaine  every day and still smokes -In the ER she was hypoxic, placed on BIPAP, given nebs mag etc. labs, creatinine normal, BNP 1618. Troponins 24 > 24. UA unremarkable. CXR showing cardiomegaly with pulmonary venous HTN and mild interstitial edema.   Hospital Course:  Acute hypoxic respiratory failure  - I suspect this is primarily from CHF exacerbation  - Required BiPAP on admission, now resolved    Acute on chronic systolic HF Pulmonary hypertension MVR Last ECHO in 07/2023 with LVEF 40 to 45%, mildly decreased LV function, global hypokinesis, grade 1 diastolic dysfunction, severely elevated pulmonary artery systolic pressure, moderate to severe mitral valve regurgitation.   --Repeat echo with EF 35%, grade 1 DD, normal RV, mild MVR - Improving, 8.3L negative, weight down 10 LB down to 137 LB at discharge  - Transitioned back to oral Lasix   -Continue Coreg , Farxiga , losartan , Aldactone  - Compliance, substance abuse continue to be  barriers, counseled at length, add home health nursing -Message sent to request follow-up in CHF clinic   COPD, ongoing tobacco use - With the bronchitis/COPD flare component as well, treated with steroids and bronchodilators  - Resume home regimen at discharge     T2DM with hyperglycemia -Continue Farxiga , sliding scale insulin    HLD - Resume home Lipitor 40 mg daily   Depression - Resume home Lexapro  20 mg daily   Tobacco use disorder Cocaine  use disorder - Counseled - TOC consult   Discharge Exam: Vitals:   03/03/24 0049 03/03/24 0508  BP: 92/72 120/83  Pulse: 82 90  Resp:    Temp:  97.9 F (36.6 C)  SpO2: 93% 90%   Gen: Awake, Alert, Oriented X 2, poor insight HEENT: no JVD Lungs: improving air movement CVS: S1S2/RRR Abd: soft, Non tender, non distended, BS present Extremities: No edema Skin: no new rashes on exposed skin   Discharge Instructions   Discharge Instructions     Diet - low sodium heart healthy   Complete by: As directed    Increase activity slowly   Complete by: As directed       Allergies as of 03/03/2024   No Known Allergies      Medication List     STOP taking these medications    nicotine  14 mg/24hr patch Commonly known as: NICODERM CQ  - dosed in mg/24 hours       TAKE these medications    albuterol  108 (90 Base) MCG/ACT inhaler Commonly known as: VENTOLIN  HFA Inhale 2 puffs into the lungs every 4 (four) hours as needed for wheezing or shortness of breath.   atorvastatin  40 MG tablet Commonly known as:  LIPITOR Take 1 tablet (40 mg total) by mouth daily.   carvedilol  3.125 MG tablet Commonly known as: COREG  Take 1 tablet (3.125 mg total) by mouth 2 (two) times daily with a meal.   dapagliflozin  propanediol 10 MG Tabs tablet Commonly known as: Farxiga  Take 1 tablet (10 mg total) by mouth daily.   escitalopram  20 MG tablet Commonly known as: LEXAPRO  TAKE 1 TABLET BY MOUTH ONCE DAILY   furosemide  40 MG  tablet Commonly known as: LASIX  Take 1 tablet (40 mg total) by mouth daily.   gabapentin  300 MG capsule Commonly known as: NEURONTIN  TAKE ONE (1) CAPSULE BY MOUTH TWICE DAILY   ipratropium-albuterol  0.5-2.5 (3) MG/3ML Soln Commonly known as: DUONEB Take 3 mLs by nebulization 2 (two) times daily.   losartan  25 MG tablet Commonly known as: COZAAR  Take 1 tablet (25 mg total) by mouth daily.   metFORMIN  500 MG 24 hr tablet Commonly known as: GLUCOPHAGE -XR TAKE 1 TABLET BY MOUTH ONCE DAILY   montelukast  10 MG tablet Commonly known as: SINGULAIR  TAKE 1 TABLET BY MOUTH AT BEDTIME   naloxone  4 MG/0.1ML Liqd nasal spray kit Commonly known as: NARCAN  Inject intranasal for overdose   predniSONE  20 MG tablet Commonly known as: DELTASONE  Take 40mg  for 2days then 20mg  for 2days then STOP   spironolactone  25 MG tablet Commonly known as: ALDACTONE  TAKE 1 TABLET BY MOUTH ONCE DAILY   Symbicort  80-4.5 MCG/ACT inhaler Generic drug: budesonide -formoterol  INHALE TWO (2) PUFFS BY MOUTH TWICE DAILY AS NEEDED FOR SHORTNESS OF BREATH AND/OR WHEEZING               Durable Medical Equipment  (From admission, onward)           Start     Ordered   03/03/24 0855  For home use only DME Nebulizer machine  Once       Question Answer Comment  Patient needs a nebulizer to treat with the following condition COPD (chronic obstructive pulmonary disease) (HCC)   Length of Need Lifetime   Additional equipment included Administration kit   Additional equipment included Filter      03/03/24 0856           No Known Allergies  Follow-up Information     [facility].   Specialty: Home Health Services Why: Agency will call you to set up apt times Contact information: [address] [place] [address] [phone]                  The results of significant diagnostics from this hospitalization (including imaging, microbiology, ancillary  and laboratory) are listed below for reference.    Significant Diagnostic Studies: ECHOCARDIOGRAM COMPLETE Result Date: 02/29/2024    ECHOCARDIOGRAM REPORT   Patient Name:   Gaylynn Mccrae Date of Exam: 02/29/2024 Medical Rec #:  [id number]       Height:       61.0 in Accession #:    [id number]      Weight:       146.8 lb Date of Birth:  1966-01-14       BSA:          1.656 m Patient Age:    57 years        BP:           122/73 mmHg Patient Gender: F               HR:  68 bpm. Exam Location:  Inpatient Procedure: 2D Echo, Cardiac Doppler and Color Doppler (Both Spectral and Color            Flow Doppler were utilized during procedure). Indications:    CHF  History:        Patient has prior history of Echocardiogram examinations, most                 recent 07/26/2023. CHF, COPD; Risk Factors:Hypertension and                 Diabetes.  Sonographer:    Janette Medley Referring Phys: [id number] SAGAR H JINWALA IMPRESSIONS  1. Left ventricular ejection fraction, by estimation, is 30 to 35%. Left ventricular ejection fraction by PLAX is 32 %. The left ventricle has moderately decreased function. The left ventricle demonstrates global hypokinesis. Left ventricular diastolic parameters are consistent with Grade I diastolic dysfunction (impaired relaxation).  2. Right ventricular systolic function is normal. The right ventricular size is normal. There is normal pulmonary artery systolic pressure.  3. Left atrial size was mildly dilated.  4. The mitral valve is degenerative. Mild mitral valve regurgitation. Mild mitral stenosis. The mean mitral valve gradient is 4.7 mmHg with average heart rate of 69 bpm.  5. The aortic valve is tricuspid. Aortic valve regurgitation is mild to moderate. No aortic stenosis is present.  6. The inferior vena cava is normal in size with greater than 50% respiratory variability, suggesting right atrial pressure of 3 mmHg. FINDINGS  Left Ventricle: Left ventricular ejection fraction, by  estimation, is 30 to 35%. Left ventricular ejection fraction by PLAX is 32 %. The left ventricle has moderately decreased function. The left ventricle demonstrates global hypokinesis. The left ventricular internal cavity size was normal in size. There is no left ventricular hypertrophy. Left ventricular diastolic parameters are consistent with Grade I diastolic dysfunction (impaired relaxation). Right Ventricle: The right ventricular size is normal. No increase in right ventricular wall thickness. Right ventricular systolic function is normal. There is normal pulmonary artery systolic pressure. The tricuspid regurgitant velocity is 2.19 m/s, and  with an assumed right atrial pressure of 3 mmHg, the estimated right ventricular systolic pressure is 22.2 mmHg. Left Atrium: Left atrial size was mildly dilated. Right Atrium: Right atrial size was normal in size. Pericardium: There is no evidence of pericardial effusion. Mitral Valve: The mitral valve is degenerative in appearance. There is mild thickening of the mitral valve leaflet(s). There is mild calcification of the mitral valve leaflet(s). Mild mitral valve regurgitation. Mild mitral valve stenosis. MV peak gradient, 9.9 mmHg. The mean mitral valve gradient is 4.7 mmHg with average heart rate of 69 bpm. Tricuspid Valve: The tricuspid valve is normal in structure. Tricuspid valve regurgitation is trivial. No evidence of tricuspid stenosis. Aortic Valve: The aortic valve is tricuspid. Aortic valve regurgitation is mild to moderate. No aortic stenosis is present. Pulmonic Valve: The pulmonic valve was normal in structure. Pulmonic valve regurgitation is trivial. No evidence of pulmonic stenosis. Aorta: The aortic root is normal in size and structure. Venous: The inferior vena cava is normal in size with greater than 50% respiratory variability, suggesting right atrial pressure of 3 mmHg. IAS/Shunts: No atrial level shunt detected by color flow Doppler.  LEFT VENTRICLE  PLAX 2D LV EF:         Left ventricular ejection fraction by PLAX is 32 %. LVIDd:         5.90 cm LVIDs:  5.00 cm LV PW:         1.55 cm LV IVS:        0.85 cm LVOT diam:     1.80 cm LV SV:         44 LV SV Index:   27 LVOT Area:     2.54 cm  RIGHT VENTRICLE            IVC RV S prime:     8.27 cm/s  IVC diam: 1.40 cm TAPSE (M-mode): 2.2 cm LEFT ATRIUM           Index        RIGHT ATRIUM           Index LA diam:      4.00 cm 2.42 cm/m   RA Area:     18.50 cm LA Vol (A2C): 83.3 ml 50.29 ml/m  RA Volume:   43.10 ml  26.02 ml/m LA Vol (A4C): 54.3 ml 32.78 ml/m  AORTIC VALVE LVOT Vmax:   97.35 cm/s LVOT Vmean:  64.450 cm/s LVOT VTI:    0.173 m  AORTA Ao Root diam: 2.80 cm Ao Asc diam:  3.20 cm MITRAL VALVE                TRICUSPID VALVE MV Area (PHT): 3.70 cm     TR Peak grad:   19.2 mmHg MV Peak grad:  9.9 mmHg     TR Vmax:        219.00 cm/s MV Mean grad:  4.7 mmHg MV Vmax:       1.58 m/s     SHUNTS MV Vmean:      101.3 cm/s   Systemic VTI:  0.17 m MV Decel Time: 205 msec     Systemic Diam: 1.80 cm MR Peak grad: 102.4 mmHg MR Mean grad: 74.0 mmHg MR Vmax:      506.00 cm/s MR Vmean:     412.0 cm/s MV E velocity: 107.00 cm/s Maudine Sos MD Electronically signed by Maudine Sos MD Signature Date/Time: 02/29/2024/4:33:06 PM    Final    DG Chest Port 1 View Result Date: 02/28/2024 CLINICAL DATA:  Shortness of breath EXAM: PORTABLE CHEST 1 VIEW COMPARISON:  11/21/2023 FINDINGS: Atherosclerotic calcification of the aortic arch. Moderate enlargement of the cardiopericardial silhouette similar to prior. Indistinct pulmonary vasculature suspicious for pulmonary venous hypertension. Faint Kerley B lines observed at the right lung base favoring mild interstitial edema. Mild lower thoracic spondylosis. IMPRESSION: 1. Moderate enlargement of the cardiopericardial silhouette with pulmonary venous hypertension and mild interstitial edema. 2. Aortic Atherosclerosis (ICD10-I70.0). Electronically Signed   By:  Freida Jes M.D.   On: 02/28/2024 14:36    Microbiology: Recent Results (from the past 240 hours)  Resp panel by RT-PCR (RSV, Flu A&B, Covid) Anterior Nasal Swab     Status: None   Collection Time: 02/28/24  3:12 PM   Specimen: Anterior Nasal Swab  Result Value Ref Range Status   SARS Coronavirus 2 by RT PCR NEGATIVE NEGATIVE Final   Influenza A by PCR NEGATIVE NEGATIVE Final   Influenza B by PCR NEGATIVE NEGATIVE Final    Comment: (NOTE) The Xpert Xpress SARS-CoV-2/FLU/RSV plus assay is intended as an aid in the diagnosis of influenza from Nasopharyngeal swab specimens and should not be used as a sole basis for treatment. Nasal washings and aspirates are unacceptable for Xpert Xpress SARS-CoV-2/FLU/RSV testing.  Fact Sheet for Patients: BloggerCourse.com  Fact Sheet for Healthcare Providers: SeriousBroker.it  This  test is not yet approved or cleared by the [place]  FDA and has been authorized for detection and/or diagnosis of SARS-CoV-2 by FDA under an Emergency Use Authorization (EUA). This EUA will remain in effect (meaning this test can be used) for the duration of the COVID-19 declaration under Section 564(b)(1) of the Act, 21 U.S.C. section 360bbb-3(b)(1), unless the authorization is terminated or revoked.     Resp Syncytial Virus by PCR NEGATIVE NEGATIVE Final    Comment: (NOTE) Fact Sheet for Patients: BloggerCourse.com  Fact Sheet for Healthcare Providers: SeriousBroker.it  This test is not yet approved or cleared by the [place]  FDA and has been authorized for detection and/or diagnosis of SARS-CoV-2 by FDA under an Emergency Use Authorization (EUA). This EUA will remain in effect (meaning this test can be used) for the duration of the COVID-19 declaration under Section 564(b)(1) of the Act, 21 U.S.C. section 360bbb-3(b)(1), unless the  authorization is terminated or revoked.  Performed at [facility], [address] [place], [place] [address]   Respiratory (~20 pathogens) panel by PCR     Status: None   Collection Time: 02/28/24  3:12 PM   Specimen: Nasopharyngeal Swab; Respiratory  Result Value Ref Range Status   Adenovirus NOT DETECTED NOT DETECTED Final   Coronavirus 229E NOT DETECTED NOT DETECTED Final    Comment: (NOTE) The Coronavirus on the Respiratory Panel, DOES NOT test for the novel  Coronavirus (2019 nCoV)    Coronavirus HKU1 NOT DETECTED NOT DETECTED Final   Coronavirus NL63 NOT DETECTED NOT DETECTED Final   Coronavirus OC43 NOT DETECTED NOT DETECTED Final   Metapneumovirus NOT DETECTED NOT DETECTED Final   Rhinovirus / Enterovirus NOT DETECTED NOT DETECTED Final   Influenza A NOT DETECTED NOT DETECTED Final   Influenza B NOT DETECTED NOT DETECTED Final   Parainfluenza Virus 1 NOT DETECTED NOT DETECTED Final   Parainfluenza Virus 2 NOT DETECTED NOT DETECTED Final   Parainfluenza Virus 3 NOT DETECTED NOT DETECTED Final   Parainfluenza Virus 4 NOT DETECTED NOT DETECTED Final   Respiratory Syncytial Virus NOT DETECTED NOT DETECTED Final   Bordetella pertussis NOT DETECTED NOT DETECTED Final   Bordetella Parapertussis NOT DETECTED NOT DETECTED Final   Chlamydophila pneumoniae NOT DETECTED NOT DETECTED Final   Mycoplasma pneumoniae NOT DETECTED NOT DETECTED Final    Comment: Performed at [facility], [address], [place], [place] [address]     Labs: Basic Metabolic Panel: Recent Labs  Lab 02/28/24 1313 02/28/24 1603 02/29/24 0248 03/01/24 0252 03/02/24 0257 03/03/24 0255  NA 142 141 141 139 138 134*  K 3.7 4.0 3.6 3.9 4.2 4.7  CL 112*  --  108 100 98 93*  CO2 23  --  25 29 30 25   GLUCOSE 157*  --  160* 191* 143* 222*  BUN 10  --  14 25* 30* 38*  CREATININE 0.86  --  0.88 1.22* 1.16* 1.24*  CALCIUM  8.8*  --  9.1 9.0 9.1 9.6  MG  --   --   --   --  2.5*  --    Liver  Function Tests: No results for input(s): "AST", "ALT", "ALKPHOS", "BILITOT", "PROT", "ALBUMIN" in the last 168 hours. No results for input(s): "LIPASE", "AMYLASE" in the last 168 hours. No results for input(s): "AMMONIA" in the last 168 hours. CBC: Recent Labs  Lab 02/28/24 1313 02/28/24 1603 02/29/24 0248  WBC 10.3  --  6.4  NEUTROABS 7.5  --   --  HGB 13.7 15.0 14.1  HCT 44.2 44.0 44.9  MCV 100.2*  --  95.5  PLT 239  --  255   Cardiac Enzymes: No results for input(s): "CKTOTAL", "CKMB", "CKMBINDEX", "TROPONINI" in the last 168 hours. BNP: BNP (last 3 results) Recent Labs    11/21/23 1223 12/21/23 1506 02/28/24 1313  BNP 1,757.1* 409.2* 1,618.8*    ProBNP (last 3 results) No results for input(s): "PROBNP" in the last 8760 hours.  CBG: Recent Labs  Lab 03/02/24 0627 03/02/24 1102 03/02/24 1619 03/02/24 2226 03/03/24 0609  GLUCAP 239* 121* 355* 216* 151*       Signed:  Deforest Fast MD.  [facility] 03/03/2024, 11:05 AM

## 2024-03-03 NOTE — Plan of Care (Signed)
  Problem: Education: Goal: Ability to describe self-care measures that may prevent or decrease complications (Diabetes Survival Skills Education) will improve 03/03/2024 0945 by Arlester Bence, RN Outcome: Adequate for Discharge 03/03/2024 0945 by Arlester Bence, RN Outcome: Adequate for Discharge Goal: Individualized Educational Video(s) 03/03/2024 0945 by Arlester Bence, RN Outcome: Adequate for Discharge 03/03/2024 0945 by Arlester Bence, RN Outcome: Adequate for Discharge

## 2024-03-03 NOTE — Progress Notes (Unsigned)
 Subjective:    Patient ID: Stacey Lin, female    DOB: 1966/07/27   MRN: [id number]    Brief patient profile:  33 yobf reports quit smoking 08/2019  previously seen by Dr. Bennetta Braun 12/18/14 with dyspnea on exertion.She was felt to have asthmatic bronchitis but not COPD  rec Stop spiriva  rx breo 100, take one inhalation each am proair  respiclick, 2 inhalations every 6 hrs only if you are having severe breathing issues. Work on weight loss and conditioning. Will send a note to your cardiologist to see about getting you off lisinopril  to see if this helps your dry tickling cough. You must stop smoking 100% in order to stay well.  followup with me again in 2mos to check on things> did not return.   History of Present Illness  02/12/2016: NP Follow up Office Visit: Patient presents to the office today with worsening dyspnea on exertion. She was seen at the heart failure clinic one day prior to OV   and they requested that she follow up with pulmonary. Spirometry 11/2014 showed  normal FEV1 with ? air trapping given her   FVC ? 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 She is continuing to smoke approx. 2 cigarettes daily per her history.    she has not been using her Breo inhaler daily as  Maintenance medication, but just when needed.   rec We will give you a Breo sample and new prescription for your Breo.( Maintenance inhaler) Use the Breo 1 puff twice daily. We will give you a prescription for your Pro Air Inhaler ( Rescue Inhaler) Use this every 6 hours as needed for SOB or Wheezing. Claritin ( loratadine) 1 every day for allergies Nasal Saline as needed for nasal congestion Continue weighing yourself daily per the heart failure clinic Follow up in 1 month > did  Not return        11/20/2018  Acute  ov/Stacey Lin re: worse since stopped symbicort   Chief Complaint  Patient presents with   Acute Visit    Increased SOB for the past 2 months. She gets winded walking from room to room at  home. She also c/o occ cough and wheezing. Her cough is mainly non prod.  She is using her albuterol  inhaler 2 x per wk on average.   Dyspnea:  Gradually worse x 2 months to point to doe  Room to room = MMRC3  Cough: acutely worse x 2 days / harsh esp daytime > noct  Sleeping: on back with 2 pillows SABA use: confused with which ones help and when to take  rec Plan A = Automatic = symbicort  80 Take 2 puffs first thing in am and then another 2 puffs about 12 hours later.  Work on inhaler technique  The key is to stop smoking completely before smoking completely stops you!  Plan B = Backup Only use your albuterol  (ventolin ) inhaler  Please schedule a follow up office visit in 2  weeks, call sooner if needed with all medications /inhalers/ solutions in hand so we can verify exactly what you are taking. This includes all medications from all doctors and over the counters - PLEASE separate them into two bags:  the ones you take automatically, no matter what, vs the ones you take just when you feel you need them "BAG #2 is UP TO YOU"  - this will really help us  help you take your medications more effectively.  - add: Prednisone  10 mg take  4 each am x  2 days,   2 each am x 2 days,  1 each am x 2 days and stop and spirometry on return     12/04/2018  f/u ov/Stacey Lin re:  AB / non adherence, still smoking  Chief Complaint  Patient presents with   Follow-up    Breathing is unchanged. She never started the pred taper b/c she did not understand the instructions. She has not had to use her albuterol  inhaler. She has had occ cough and wheezing at night.   Dyspnea:  MMRC3 = can't walk 100 yards even at a slow pace at a flat grade s stopping due to sob   Cough: daytime / not productive  Sleeping: ok after ambien  flat bed 2 pillows  SABA use: not using as long as on symbbicort  02: none  rec  Prednisone  10 mg take  4 each am x 2 days,   2 each am x 2 days,  1 each am x 2 days and stop  Work on inhaler  technique:   Plan A = Automatic = Symbicort  80 Take 2 puffs first thing in am and then another 2 puffs about 12 hours later.  Plan B = Backup Only use your albuterol  inhaler as a rescue medication Please schedule a follow up office visit in 2 weeks, sooner if needed  with all medications /inhalers/ solutions in hand so we can verify exactly what you are taking. This includes all medications from all doctors and over the counters  09/02/2019  f/u ov/Stacey Lin re: uacs onset 08/2018 on entresto   - did not bring meds as req  Chief Complaint  Patient presents with   Follow-up    Cough is slightly better. She never started prilosec.   Dyspnea:  Room to room Cough: daytime and dry Sleeping: on side 2 pillows  SABA use: not helping  02: none  rec Pantoprazole  (protonix ) 40 mg   Take  30-60 min before first meal of the day and Pepcid  (famotidine )  20 mg one @  bedtime until return to office - this is the best way to tell whether stomach acid is contributing to your problem.   Change gabapentin  to 300 mg three times daily  You may have to stop the Entresto  and start a substitute but I will defer that to Dr Alver Austin works for cough but it is a last resort   televist  note 12/23/19  Stop all smoking  Symbicort  80 Take 2 puffs first thing in am and then another 2 puffs about 12 hours later.  Only use your albuterol  (BLUE ventolin )  as a rescue medication  Always take your medications with you to see your doctors (including me) Based on the nature of your cough I recommend a trial off entresto  for up to 6 week > did not do  If your heartcare providers do not agree with a substitute for your entresto  I will ask them to send you to another pulmonary specialist as I can't help solve this problem without doing this step first.     03/05/2021  f/u ov/Stacey Lin re: resumed smoking/ did not bring meds  ? Better cough  off entresto   Chief Complaint  Patient presents with   Acute Visit    Pt c/o increased  SOB past few months- she gets winded walking room to room at home. She is coughing and wheezing. Cough is prod with white sputum.   Dyspnea:  Room to room gives out  Cough: sporadic but better than  used to be on entresto , worse off gerd rx Sleeping: on side / bed is flat  SABA use: not using symbicort  regularly   02: none Covid status:   vax x 2  Rec     03/05/2024  f/u ov/Stacey Lin re: ***   maint on ***  No chief complaint on file.   Dyspnea:  *** Cough: *** Sleeping: *** resp cc  SABA use: *** 02: ***  Lung cancer screening :  ***    No obvious day to day or daytime variability or assoc excess/ purulent sputum or mucus plugs or hemoptysis or cp or chest tightness, subjective wheeze or overt sinus or hb symptoms.    Also denies any obvious fluctuation of symptoms with weather or environmental changes or other aggravating or alleviating factors except as outlined above   No unusual exposure hx or h/o childhood pna/ asthma or knowledge of premature birth.  Current Allergies, Complete Past Medical History, Past Surgical History, Family History, and Social History were reviewed in [employer] record.  ROS  The following are not active complaints unless bolded Hoarseness, sore throat, dysphagia, dental problems, itching, sneezing,  nasal congestion or discharge of excess mucus or purulent secretions, ear ache,   fever, chills, sweats, unintended wt loss or wt gain, classically pleuritic or exertional cp,  orthopnea pnd or arm/hand swelling  or leg swelling, presyncope, palpitations, abdominal pain, anorexia, nausea, vomiting, diarrhea  or change in bowel habits or change in bladder habits, change in stools or change in urine, dysuria, hematuria,  rash, arthralgias, visual complaints, headache, numbness, weakness or ataxia or problems with walking or coordination,  change in mood or  memory.        No outpatient medications have been marked as taking for the 03/05/24  encounter (Appointment) with Diamond Formica, MD.                     Objective:   Physical Exam   wts  03/05/2024         ***  03/05/2021        173 09/02/2019        200  08/16/2019      192 12/19/2018        202  12/04/2018        205   11/20/18 201 lb 6.4 oz (91.4 kg)  10/30/18 197 lb (89.4 kg)  09/17/18 198 lb (89.8 kg)      Vital signs reviewed  03/05/2024  - Note at rest 02 sats  ***% on ***   General appearance:    ***   edentulous***               Assessment & Plan:

## 2024-03-03 NOTE — Plan of Care (Signed)
  Problem: Education: Goal: Knowledge of General Education information will improve Description: Including pain rating scale, medication(s)/side effects and non-pharmacologic comfort measures Outcome: Progressing   Problem: Clinical Measurements: Goal: Respiratory complications will improve Outcome: Progressing   Problem: Activity: Goal: Risk for activity intolerance will decrease Outcome: Progressing   Problem: Elimination: Goal: Will not experience complications related to urinary retention Outcome: Progressing   Problem: Activity: Goal: Capacity to carry out activities will improve Outcome: Progressing   Problem: Education: Goal: Knowledge of disease or condition will improve Outcome: Not Progressing

## 2024-03-03 NOTE — Progress Notes (Signed)
 Pt discharged and left the unit before transport arrived

## 2024-03-03 NOTE — TOC Transition Note (Signed)
 Transition of Care [facility]) - Discharge Note   Patient Details  Name: Stacey Lin MRN: [id number] Date of Birth: January 03, 1966  [facility] CM/SW Contact:  Omie Bickers, RN Phone Number: 03/03/2024, 9:01 AM   Clinical Narrative:     [name] w patient. She states that her [name] will get her from the hospital. She needs a nebulizer, this will be delivered to her room from [facility].  [facility] notified of DC   Final next level of care: Home w Home Health Services Barriers to Discharge: No Barriers Identified   Patient Goals and CMS Choice            Discharge Placement                       Discharge Plan and Services Additional resources added to the After Visit Summary for                  DME Arranged: Nebulizer machine DME Agency: [employer] Date DME Agency Contacted: 03/03/24 Time DME Agency Contacted: 0900 Representative spoke with at DME Agency: Aureliano Blow Agency: [facility] Date [facility] Agency Contacted: 03/03/24 Time HH Agency Contacted: 0901 Representative spoke with at [facility] Agency: Randel Buss  Social Drivers of Health (SDOH) Interventions SDOH Screenings   Food Insecurity: Food Insecurity Present (02/28/2024)  Housing: High Risk (02/28/2024)  Transportation Needs: Unmet Transportation Needs (02/28/2024)  Utilities: At Risk (02/28/2024)  Depression (PHQ2-9): High Risk (10/19/2023)  Financial Resource Strain: High Risk (12/21/2023)  Physical Activity: Unknown (10/18/2023)  Recent Concern: Physical Activity - Inactive (08/02/2023)  Social Connections: Moderately Isolated (11/22/2023)  Stress: Stress Concern Present (10/18/2023)  Tobacco Use: Medium Risk (02/28/2024)     Readmission Risk Interventions    04/13/2022   12:21 PM  Readmission Risk Prevention Plan  Transportation Screening Complete  PCP or Specialist Appt within 3-5 Days Complete  HRI or Home Care Consult Complete  Social Work Consult for Recovery Care Planning/Counseling  Complete  Palliative Care Screening Not Applicable  Medication Review Oceanographer) Complete

## 2024-03-04 ENCOUNTER — Telehealth: Payer: Self-pay | Admitting: *Deleted

## 2024-03-04 NOTE — Transitions of Care (Post Inpatient/ED Visit) (Signed)
   03/04/2024  Name: Kariana Sielski MRN: [id number] DOB: 1966/03/06  Today's TOC FU Call Status: Today's TOC FU Call Status:: Unsuccessful Call (1st Attempt) Unsuccessful Call (1st Attempt) Date: 03/04/24  Attempted to reach the patient regarding the most recent Inpatient/ED visit.  Follow Up Plan: Additional outreach attempts will be made to reach the patient to complete the Transitions of Care (Post Inpatient/ED visit) call.   Arna Better RN, BSN [place]  [facility] RN Care Manager [phone]

## 2024-03-05 ENCOUNTER — Ambulatory Visit (INDEPENDENT_AMBULATORY_CARE_PROVIDER_SITE_OTHER)

## 2024-03-05 ENCOUNTER — Ambulatory Visit: Admitting: Internal Medicine

## 2024-03-05 ENCOUNTER — Telehealth: Payer: Self-pay

## 2024-03-05 ENCOUNTER — Encounter: Payer: Self-pay | Admitting: Internal Medicine

## 2024-03-05 ENCOUNTER — Ambulatory Visit: Payer: Self-pay | Admitting: Internal Medicine

## 2024-03-05 VITALS — BP 114/80 | HR 82 | Temp 97.8°F | Ht 61.0 in | Wt 142.6 lb

## 2024-03-05 DIAGNOSIS — F17210 Nicotine dependence, cigarettes, uncomplicated: Secondary | ICD-10-CM

## 2024-03-05 DIAGNOSIS — R0609 Other forms of dyspnea: Secondary | ICD-10-CM

## 2024-03-05 DIAGNOSIS — R059 Cough, unspecified: Secondary | ICD-10-CM | POA: Diagnosis not present

## 2024-03-05 DIAGNOSIS — J4540 Moderate persistent asthma, uncomplicated: Secondary | ICD-10-CM

## 2024-03-05 DIAGNOSIS — I700 Atherosclerosis of aorta: Secondary | ICD-10-CM | POA: Diagnosis not present

## 2024-03-05 DIAGNOSIS — I517 Cardiomegaly: Secondary | ICD-10-CM | POA: Diagnosis not present

## 2024-03-05 LAB — CBC WITH DIFFERENTIAL/PLATELET
Basophils Absolute: 0.1 10*3/uL (ref 0.0–0.1)
Basophils Relative: 0.7 % (ref 0.0–3.0)
Eosinophils Absolute: 0.3 10*3/uL (ref 0.0–0.7)
Eosinophils Relative: 2.3 % (ref 0.0–5.0)
HCT: 48.1 % — ABNORMAL HIGH (ref 36.0–46.0)
Hemoglobin: 15.2 g/dL — ABNORMAL HIGH (ref 12.0–15.0)
Lymphocytes Relative: 24.2 % (ref 12.0–46.0)
Lymphs Abs: 2.7 10*3/uL (ref 0.7–4.0)
MCHC: 31.5 g/dL (ref 30.0–36.0)
MCV: 95.5 fl (ref 78.0–100.0)
Monocytes Absolute: 1 10*3/uL (ref 0.1–1.0)
Monocytes Relative: 9 % (ref 3.0–12.0)
Neutro Abs: 7.1 10*3/uL (ref 1.4–7.7)
Neutrophils Relative %: 63.8 % (ref 43.0–77.0)
Platelets: 292 10*3/uL (ref 150.0–400.0)
RBC: 5.04 Mil/uL (ref 3.87–5.11)
RDW: 13.5 % (ref 11.5–15.5)
WBC: 11.1 10*3/uL — ABNORMAL HIGH (ref 4.0–10.5)

## 2024-03-05 LAB — BASIC METABOLIC PANEL WITH GFR
BUN: 41 mg/dL — ABNORMAL HIGH (ref 6–23)
CO2: 28 meq/L (ref 19–32)
Calcium: 9.8 mg/dL (ref 8.4–10.5)
Chloride: 98 meq/L (ref 96–112)
Creatinine, Ser: 1.23 mg/dL — ABNORMAL HIGH (ref 0.40–1.20)
GFR: 47.97 mL/min — ABNORMAL LOW (ref >60.00)
Glucose, Bld: 140 mg/dL — ABNORMAL HIGH (ref 70–99)
Potassium: 4.4 meq/L (ref 3.5–5.1)
Sodium: 135 meq/L (ref 135–145)

## 2024-03-05 LAB — D-DIMER, QUANTITATIVE: D-Dimer, Quant: 0.24 ug{FEU}/mL (ref <0.50)

## 2024-03-05 LAB — BRAIN NATRIURETIC PEPTIDE: Pro B Natriuretic peptide (BNP): 107 pg/mL — ABNORMAL HIGH (ref 0.0–100.0)

## 2024-03-05 LAB — TSH: TSH: 1.18 u[IU]/mL (ref 0.35–5.50)

## 2024-03-05 MED ORDER — PANTOPRAZOLE SODIUM 40 MG PO TBEC: 40.0000 mg | DELAYED_RELEASE_TABLET | Freq: Every day | ORAL | 2 refills | Status: DC

## 2024-03-05 MED ORDER — GUAIFENESIN-CODEINE 100-10 MG/5ML PO SOLN: 10.0000 mL | ORAL | 0 refills | Status: DC | PRN

## 2024-03-05 MED ORDER — METHYLPREDNISOLONE ACETATE 80 MG/ML IJ SUSP
120.0000 mg | Freq: Once | INTRAMUSCULAR | Status: AC
  Administered 2024-03-05: 120 mg via INTRAMUSCULAR

## 2024-03-05 MED ORDER — FAMOTIDINE 20 MG PO TABS: ORAL_TABLET | ORAL | 11 refills | Status: DC

## 2024-03-05 MED ORDER — VALSARTAN 40 MG PO TABS: 40.0000 mg | ORAL_TABLET | Freq: Every day | ORAL | 11 refills | Status: DC

## 2024-03-05 NOTE — Patient Instructions (Addendum)
 Depomedrol 120 mg IM   Guaifenesin  with codeine  1 tsp every 4 hours as needed   Please remember to go to the  x-ray department  for your tests - we will call you with the results when they are available    Please remember to go to the lab department   for your tests - we will call you with the results when they are available.     Stop losartan  and start valsartan  40 mg daily in its place  Duoneb 4 x times a day as planned and also start your prednisone    Pantoprazole  (protonix ) 40 mg   Take  30-60 min before first meal of the day and Pepcid  (famotidine )  20 mg after supper until return to office - this is the best way to tell whether stomach acid is contributing to your problem.    GERD (REFLUX)  is an extremely common cause of respiratory symptoms just like yours , many times with no obvious heartburn at all.    It can be treated with medication, but also with lifestyle changes including elevation of the head of your bed (ideally with 6 -8inch blocks under the headboard of your bed),  Smoking cessation, avoidance of late meals, excessive alcohol, and avoid fatty foods, chocolate, peppermint, colas, red wine, and acidic juices such as orange juice.  NO MINT OR MENTHOL PRODUCTS SO NO COUGH DROPS  USE SUGARLESS CANDY INSTEAD (Jolley ranchers or Stover's or Life Savers) or even ice chips will also do - the key is to swallow to prevent all throat clearing. NO OIL BASED VITAMINS - use powdered substitutes.  Avoid fish oil when coughing.   Please schedule a follow up office visit in 4 weeks, sooner if needed  with all medications /inhalers/ solutions in hand so we can verify exactly what you are taking. This includes all medications from all doctors and over the counters

## 2024-03-05 NOTE — Assessment & Plan Note (Addendum)
 Counseled re importance of smoking cessation but did not meet time criteria for separate billing       Each maintenance medication was reviewed in detail including emphasizing most importantly the difference between maintenance and prns and under what circumstances the prns are to be triggered using an action plan format where appropriate.  Total time for H and P, chart review, counseling, reviewing hfa/neb device(s) and generating customized AVS unique to this office visit / same day charting = 40 min  for post hops f/u with pt who has  multiple chronic   refractory respiratory  symptoms of uncertain etiology

## 2024-03-05 NOTE — Assessment & Plan Note (Addendum)
 Quit smoking 11//2020  PFTs 10/2013:  FEV1 0.86 (41%), ratio 67, TLC 58%, unable to do DLCO Spiro as part of CPST 02/2014:  FEV1 1.19 (59%), ratio 72 Spiro 11/2014:  FEV1 1.08 (54%), ratio 80 - Spirometry 04/14/2016  FEV1 1.32 (68%)  Ratio 79 with nl effort indep portion of f/v loop  11/20/2018   resume symb 80 2bid x 2 week sample plus prednisone   X 6 days  then regroup with all meds in hand > did not do and as of 12/04/2018 no prednisone  taken/still smoking  - Allergy  profile 12/19/2018 >  Eos 0. /  IgE 8 rast neg  - 03/05/2021  After extensive coaching inhaler device,  effectiveness =    25% at baseline > try breztri  sample one bid x 4 week samples then ov with all meds / depomedrol 80 mg IM  And prn saba / max gerd rx  DDX of  difficult airways management almost all start with A and  include Adherence, Ace Inhibitors, Acid Reflux, Active Sinus Disease, Alpha 1 Antitripsin deficiency, Anxiety masquerading as Airways dz,  ABPA,  Allergy (esp in young), Aspiration (esp in elderly), Adverse effects of meds,  Active smoking or vaping, A bunch of PE's (a small clot burden can't cause this syndrome unless there is already severe underlying pulm or vascular dz with poor reserve) plus two Bs  = Bronchiectasis and Beta blocker use..and one C= CHF   Adherence is always the initial "prime suspect" and is a multilayered concern that requires a "trust but verify" approach in every patient - starting with knowing how to use medications, especially inhalers, correctly, keeping up with refills and understanding the fundamental difference between maintenance and prns vs those medications only taken for a very short course and then stopped and not refilled.  - see device teaching  - return with all meds in hand using a trust but verify approach to confirm accurate Medication  Reconciliation The principal here is that until we are certain that the  patients are doing what we've asked, it makes no sense to ask them to do more.    Active smoker > see sep a/p  ? Acid (or non-acid) GERD > always difficult to exclude as up to 75% of pts in some series report no assoc GI/ Heartburn symptoms and she has classic globus sensation > rec max (24h)  acid suppression and diet restrictions/ reviewed and instructions given in writing.   ? Allergy  /asthma component > depomedrol 120 mg IM and finish course of pred already at pharmacyand continue symbicort  / duoneb up to qid   ? Beta blockerr > probably very low dose coreg  ok here   ? CHF > cm s edema or effusion on cxr today

## 2024-03-05 NOTE — Transitions of Care (Post Inpatient/ED Visit) (Signed)
   03/05/2024  Name: Stacey Lin MRN: [id number] DOB: [birth year]  Today's TOC FU Call Status: Today's TOC FU Call Status:: Unsuccessful Call (2nd Attempt) Unsuccessful Call (2nd Attempt) Date: 03/05/24  Attempted to reach the patient regarding the most recent Inpatient/ED visit.  Follow Up Plan: Additional outreach attempts will be made to reach the patient to complete the Transitions of Care (Post Inpatient/ED visit) call.   Sonakshi Rolland J. Cristofher Livecchi RN, MSN [facility] RN Care Manager Direct Dial: [phone]  Fax: [phone] Website: [email or web address]

## 2024-03-06 ENCOUNTER — Telehealth: Payer: Self-pay | Admitting: *Deleted

## 2024-03-06 NOTE — Telephone Encounter (Signed)
 Copied from CRM [id number]. Topic: Clinical - Prescription Issue >> Mar 05, 2024  4:20 PM Hilton Lucky wrote: Reason for CRM: Richard with [facility] is calling in to state that they have outreached a few times regarding the refill of ipratropium-albuterol  (DUONEB) 0.5-2.5 (3) MG/3ML SOLN. According to system, authorizing provider is not with this office and refill was sent to [facility] on 03/03/2024.   Patient is calling in to [facility] to request that they follow up with Dr. Waymond Hailey about this prescription. Please reach out to patient to clarify, if patient did not collect prescription at [facility].  For questions, please reach back out to Richard at [phone].  Need to find out from the pt where she wants the rx to be sent. It was already sent to [facility] by another provider. Called the pt and there was no answer- LMTCB

## 2024-03-06 NOTE — Transitions of Care (Post Inpatient/ED Visit) (Signed)
   03/06/2024  Name: Stacey Lin MRN: [id number] DOB: 1966/10/08  Today's TOC FU Call Status: Today's TOC FU Call Status:: Unsuccessful Call (3rd Attempt) Unsuccessful Call (3rd Attempt) Date: 03/06/24  Attempted to reach the patient regarding the most recent Inpatient/ED visit.  Follow Up Plan: No further outreach attempts will be made at this time. We have been unable to contact the patient.  Arna Better RN, BSN [place]  [facility] RN Care Manager [phone]

## 2024-03-07 ENCOUNTER — Telehealth (INDEPENDENT_AMBULATORY_CARE_PROVIDER_SITE_OTHER): Payer: Self-pay | Admitting: Primary Care

## 2024-03-07 ENCOUNTER — Telehealth: Payer: Self-pay

## 2024-03-07 NOTE — Telephone Encounter (Signed)
 Called pt to confirm appt. LVM for pt with appt details.

## 2024-03-07 NOTE — Telephone Encounter (Signed)
 Copied from CRM [id number]. Topic: General - Other >> Mar 05, 2024  4:39 PM Jethro Morrison wrote: Reason for CRM: HOME HEALTH STARTING CARE WILL BE 05/15 THE PATIENT REQUESTED THIS

## 2024-03-08 NOTE — Telephone Encounter (Signed)
 Noted.

## 2024-03-12 ENCOUNTER — Ambulatory Visit (INDEPENDENT_AMBULATORY_CARE_PROVIDER_SITE_OTHER): Admitting: Primary Care

## 2024-03-13 ENCOUNTER — Ambulatory Visit (INDEPENDENT_AMBULATORY_CARE_PROVIDER_SITE_OTHER): Admitting: Podiatry

## 2024-03-13 ENCOUNTER — Encounter: Payer: Self-pay | Admitting: Pharmacist

## 2024-03-13 ENCOUNTER — Other Ambulatory Visit (HOSPITAL_BASED_OUTPATIENT_CLINIC_OR_DEPARTMENT_OTHER): Admitting: Pharmacist

## 2024-03-13 ENCOUNTER — Encounter: Payer: Self-pay | Admitting: Podiatry

## 2024-03-13 DIAGNOSIS — M2141 Flat foot [pes planus] (acquired), right foot: Secondary | ICD-10-CM | POA: Diagnosis not present

## 2024-03-13 DIAGNOSIS — M79674 Pain in right toe(s): Secondary | ICD-10-CM

## 2024-03-13 DIAGNOSIS — M79675 Pain in left toe(s): Secondary | ICD-10-CM

## 2024-03-13 DIAGNOSIS — E785 Hyperlipidemia, unspecified: Secondary | ICD-10-CM

## 2024-03-13 DIAGNOSIS — M2142 Flat foot [pes planus] (acquired), left foot: Secondary | ICD-10-CM

## 2024-03-13 DIAGNOSIS — B351 Tinea unguium: Secondary | ICD-10-CM

## 2024-03-13 DIAGNOSIS — E119 Type 2 diabetes mellitus without complications: Secondary | ICD-10-CM

## 2024-03-13 DIAGNOSIS — Z7984 Long term (current) use of oral hypoglycemic drugs: Secondary | ICD-10-CM

## 2024-03-13 DIAGNOSIS — L853 Xerosis cutis: Secondary | ICD-10-CM

## 2024-03-13 DIAGNOSIS — M5416 Radiculopathy, lumbar region: Secondary | ICD-10-CM | POA: Insufficient documentation

## 2024-03-13 DIAGNOSIS — L84 Corns and callosities: Secondary | ICD-10-CM

## 2024-03-13 MED ORDER — AMMONIUM LACTATE 12 % EX LOTN: 1.0000 | TOPICAL_LOTION | CUTANEOUS | 5 refills | Status: AC | PRN

## 2024-03-13 NOTE — Progress Notes (Signed)
 Pharmacy Quality Measure Review  This patient is appearing on a report for being at risk of failing the adherence measure for cholesterol (statin) and diabetes medications this calendar year.   Medication: atorvastatin  Last fill date: 02/26/24 for 30 day supply  Medication: metformin  Last fill date: 5/5 for 30 day supply  Insurance report was not up to date. No action needed at this time.  Reminder set for next fill date.   Marene Shape, PharmD, Becky Bowels, CPP Clinical Pharmacist [facility] [phone]

## 2024-03-13 NOTE — Progress Notes (Unsigned)
 ANNUAL DIABETIC FOOT EXAM  Subjective: Stacey Lin presents today for annual diabetic foot exam.  Chief Complaint  Patient presents with   Nail Problem    Pt presents for diabetic foot care    Patient confirms h/o diabetes.  Patient denies any h/o foot wounds.  Patient has h/o foot/leg ulcer of {jgPodToeLocator:23637}.  Patient has h/o amputation(s): {jgamp:23617}.  Patient has been diagnosed with neuropathy.  Stacey Siemens, NP is patient's PCP.  Past Medical History:  Diagnosis Date   Anemia    Arrhythmia    Asthma    CHF (congestive heart failure) (HCC)    1990s   Chronic headaches    COPD (chronic obstructive pulmonary disease) (HCC)    ??   Hypercholesteremia    Hypertension    Patient Active Problem List   Diagnosis Date Noted   Lumbar radiculopathy 03/13/2024   CHF (congestive heart failure) (HCC) 02/29/2024   COPD with acute exacerbation (HCC) 02/28/2024   Respiratory failure with hypoxia (HCC) 11/21/2023   COPD exacerbation (HCC) 07/25/2023   Nonrheumatic aortic valve insufficiency    Elevated LFTs 04/08/2022   Acute metabolic encephalopathy 04/08/2022   Depressed mood 04/08/2022   Headache 04/07/2022   Obesity (BMI 30-39.9) 04/07/2022   Acute exacerbation of CHF (congestive heart failure) (HCC) 04/06/2022   Polysubstance abuse (HCC) 04/06/2022   Elevated troponin 04/06/2022   Controlled type 2 diabetes mellitus with hyperglycemia (HCC) 04/06/2022   Transaminitis 04/06/2022   Hypoalbuminemia 04/06/2022   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/11/2021   Upper airway cough syndrome 12/20/2018   Morbid obesity due to excess calories (HCC) 12/04/2018   Cigarette smoker 11/21/2018   Asthmatic bronchitis, moderate persistent, uncomplicated 11/20/2018   DOE (dyspnea on exertion) 04/14/2016   Streptococcus pneumoniae pneumonia (HCC) 04/07/2016   Acute respiratory failure with hypoxia (HCC) 04/03/2016   Elevated lactic acid level  04/03/2016   Leukocytosis 04/03/2016   New onset type 2 diabetes mellitus (HCC) 04/03/2016   Abnormal urinalysis 04/03/2016   Trichomonas infection 04/03/2016   Non compliance w medication regimen 01/12/2016   COPD without exacerbation (HCC) 12/04/2014   AKI (acute kidney injury) (HCC) 03/14/2014   Chronic systolic HF (heart failure) (HCC) 03/14/2014   Musculoskeletal chest pain 03/14/2014   Dyspnea 12/03/2013   Periodic health assessment, general screening, adult 10/03/2013   Mitral valve regurgitation 08/22/2013   FIBROIDS, UTERUS 05/11/2010   ANEMIA, SECONDARY TO BLOOD LOSS 05/11/2010   Asthmatic bronchitis 05/11/2010   TOBACCO USER 07/07/2009   Essential hypertension, benign 06/19/2009   MENORRHAGIA 06/19/2009   COCAINE  ABUSE, HX OF 06/19/2009   Past Surgical History:  Procedure Laterality Date   LEFT AND RIGHT HEART CATHETERIZATION WITH CORONARY ANGIOGRAM N/A 06/06/2014   Procedure: LEFT AND RIGHT HEART CATHETERIZATION WITH CORONARY ANGIOGRAM;  Surgeon: Mardell Shade, MD;  Location: [facility] CATH LAB;  Service: Cardiovascular;  Laterality: N/A;   MULTIPLE EXTRACTIONS WITH ALVEOLOPLASTY Bilateral 07/24/2015   Procedure: MULTIPLE EXTRACTIONS WITH ALVEOLOPLASTY,  BILATERAL TORI ;  Surgeon: Ascencion Lava, DDS;  Location: [facility] OR;  Service: Oral Surgery;  Laterality: Bilateral;   RIGHT/LEFT HEART CATH AND CORONARY ANGIOGRAPHY N/A 04/11/2022   Procedure: RIGHT/LEFT HEART CATH AND CORONARY ANGIOGRAPHY;  Surgeon: Lucendia Rusk, MD;  Location: [facility] INVASIVE CV LAB;  Service: Cardiovascular;  Laterality: N/A;   TUBAL LIGATION  1986   Current Outpatient Medications on File Prior to Visit  Medication Sig Dispense Refill   albuterol  (VENTOLIN  HFA) 108 (90 Base) MCG/ACT inhaler Inhale 2 puffs into  the lungs every 4 (four) hours as needed for wheezing or shortness of breath. 17 g 1   atorvastatin  (LIPITOR) 40 MG tablet Take 1 tablet (40 mg total) by mouth daily. 90 tablet 3   carvedilol   (COREG ) 3.125 MG tablet Take 1 tablet (3.125 mg total) by mouth 2 (two) times daily with a meal. 90 tablet 1   dapagliflozin  propanediol (FARXIGA ) 10 MG TABS tablet Take 1 tablet (10 mg total) by mouth daily. 60 tablet 1   escitalopram  (LEXAPRO ) 20 MG tablet TAKE 1 TABLET BY MOUTH ONCE DAILY 90 tablet 0   famotidine  (PEPCID ) 20 MG tablet One after supper 30 tablet 11   furosemide  (LASIX ) 40 MG tablet Take 1 tablet (40 mg total) by mouth daily. 60 tablet 1   gabapentin  (NEURONTIN ) 300 MG capsule TAKE ONE (1) CAPSULE BY MOUTH TWICE DAILY 60 capsule 10   guaiFENesin -codeine  100-10 MG/5ML syrup Take 10 mLs by mouth every 4 (four) hours as needed for cough. 237 mL 0   ipratropium-albuterol  (DUONEB) 0.5-2.5 (3) MG/3ML SOLN Take 3 mLs by nebulization 2 (two) times daily. 180 mL 1   metFORMIN  (GLUCOPHAGE -XR) 500 MG 24 hr tablet TAKE 1 TABLET BY MOUTH ONCE DAILY 90 tablet 0   montelukast  (SINGULAIR ) 10 MG tablet TAKE 1 TABLET BY MOUTH AT BEDTIME 30 tablet 10   naloxone  (NARCAN ) nasal spray 4 mg/0.1 mL Inject intranasal for overdose 1 each 1   pantoprazole  (PROTONIX ) 40 MG tablet Take 1 tablet (40 mg total) by mouth daily. Take 30-60 min before first meal of the day 30 tablet 2   predniSONE  (DELTASONE ) 20 MG tablet Take 40mg  for 2days then 20mg  for 2days then STOP 8 tablet 0   spironolactone  (ALDACTONE ) 25 MG tablet TAKE 1 TABLET BY MOUTH ONCE DAILY 30 tablet 10   SYMBICORT  80-4.5 MCG/ACT inhaler INHALE TWO (2) PUFFS BY MOUTH TWICE DAILY AS NEEDED FOR SHORTNESS OF BREATH AND/OR WHEEZING 10.2 g 10   valsartan  (DIOVAN ) 40 MG tablet Take 1 tablet (40 mg total) by mouth daily. 30 tablet 11   No current facility-administered medications on file prior to visit.    No Known Allergies Social History   Occupational History   Not on file  Tobacco Use   Smoking status: Former    Current packs/day: 0.00    Average packs/day: 1 pack/day for 35.0 years (35.0 ttl pk-yrs)    Types: Cigarettes    Start date:  08/25/1984    Quit date: 08/26/2019    Years since quitting: 4.5   Smokeless tobacco: Never  Vaping Use   Vaping status: Never Used  Substance and Sexual Activity   Alcohol use: No    Alcohol/week: 0.0 standard drinks of alcohol   Drug use: Not Currently    Frequency: 4.0 times per week    Types: "Crack" cocaine     Comment: RELAPSE 12/2016   Sexual activity: Not on file   Family History  Problem Relation Age of Onset   Hypertension Mother    Allergies Mother    Asthma Mother    Heart disease Mother    Stomach cancer Mother        Deceased, 86   Seizures Mother    Hypertension Father        Deceased   Hypertension Maternal Grandmother    Healthy Brother    Healthy Son    Healthy Daughter    Immunization History  Administered Date(s) Administered   Influenza, Seasonal, Injecte, Preservative Fre 07/26/2023  Influenza,inj,Quad PF,6+ Mos 08/20/2013, 07/12/2018, 09/04/2019, 08/23/2020   Influenza-Unspecified 07/24/2014   Pneumococcal Polysaccharide-23 08/20/2013, 04/05/2016   Tdap 02/22/2018     Review of Systems: Negative except as noted in the HPI.   Objective: There were no vitals filed for this visit.  Stacey Lin is a pleasant 58 y.o. female in NAD. AAO X 3. {Perform Simple Foot Exam  Perform Detailed exam:1} {Insert foot Exam (Optional):30965}  Lab Results  Component Value Date   HGBA1C 5.8 (H) 02/28/2024   ADA Risk Categorization: Low Risk :  Patient has all of the following: Intact protective sensation No prior foot ulcer  No severe deformity Pedal pulses present  High Risk  Patient has one or more of the following: Loss of protective sensation Absent pedal pulses Severe Foot deformity History of foot ulcer  Assessment: 1. Pain due to onychomycosis of toenails of both feet   2. Xerosis cutis   3. Callus   4. Controlled type 2 diabetes mellitus without complication, without long-term current use of insulin  (HCC)     Plan: No orders of the  defined types were placed in this encounter.  Meds ordered this encounter  Medications   ammonium lactate  (LAC-HYDRIN ) 12 % lotion    Sig: Apply 1 Application topically as needed for dry skin.    Dispense:  400 g    Refill:  5  {jgplan:23602::"-Patient/POA to call should there be question/concern in the interim."} Return in about 3 months (around 06/13/2024).  Stacey Lin, DPM      [place] LOCATION: [address] [place] [address]                   Office [phone]   [facility] LOCATION: [address] [place], [place] [address] Office [phone]

## 2024-03-13 NOTE — Progress Notes (Incomplete)
 Patient ID: Stacey Lin, female   DOB: 08/24/1966, 58 y.o.   MRN: [id number]    Advanced Heart Failure Clinic Note   PCP: Dr Fabiola Holy  Pulmonologist: Dr. Bennetta Braun HF: Dr. 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  EF as low as 30-35% in 2014.   cMRI 08/27/13: EF 32% RV mildly dilated, no scar or infiltrative disease. Mod MR and Mod-sev TR  R/LHC (06/06/14): ectatic coronaries (suspect d/t HTN) no significant arthrosclerosis and normal EF. No significant MR noted  Echo (05/2014): EF 55-60%, mild/mod AI, grade I DD and mild MR  EF stable at 55-60% in 2019.   She has not been seen in HF clinic since 2021. She has no-showed multiple appointments in our office.   She was admitted in 06/23 with acute respiratory failure with hypoxia 2/2 acute on chronic systolic CHF. EF down to 30-35% on echo with normal RV, severe BAE, moderate to severe MR, moderate to severe TR and moderate to severe AI. Diuresed and GDMT titrated. R/LHC with no CAD and reduced Fick CI of 1.96 L/min/m2.  Readmit 10/24 with COPD exacerbation in setting of noncompliance with medications and ongoing tobacco use + recent cocaine  use. Repeat echo with EF 40-45%.  Admitted 01/25 with acute respiratory failure 2/2 COPD exacerbation and influenza A. She also had acute on chronic CHF and diuresed with IV lasix .  Here today for overdue CHF follow-up. She is accompanied by her friend/caregiver who assists with providing the history. She continues to struggle with chronic shortness of breath. No lower extremity edema. For the most part taking her medications, occasionally misses a dose.   Has been struggling with a lot of depression. Uses crack routinely. Patient has several people living with her  who would otherwise be homeless. She has a court hearing  for housing coming up soon and may be evicted from her home. Has medicaid and gets disability check very month.   FH: Mother deceased: Dementia, seizures, stomach cancer        Father deceased: HTN  Review of systems complete and found to be negative unless listed in HPI.    Past Medical History:  Diagnosis Date   Anemia    Arrhythmia    Asthma    CHF (congestive heart failure) (HCC)    1990s   Chronic headaches    COPD (chronic obstructive pulmonary disease) (HCC)    ??   Hypercholesteremia    Hypertension     Current Outpatient Medications  Medication Sig Dispense Refill   albuterol  (VENTOLIN  HFA) 108 (90 Base) MCG/ACT inhaler Inhale 2 puffs into the lungs every 4 (four) hours as needed for wheezing or shortness of breath. 17 g 1   ammonium lactate  (LAC-HYDRIN ) 12 % lotion Apply 1 Application topically as needed for dry skin. 400 g 5   atorvastatin  (LIPITOR) 40 MG tablet Take 1 tablet (40 mg total) by mouth daily. 90 tablet 3   carvedilol  (COREG ) 3.125 MG tablet Take 1 tablet (3.125 mg total) by mouth 2 (two) times daily with a meal. 90 tablet 1   dapagliflozin  propanediol (FARXIGA ) 10 MG TABS tablet Take 1 tablet (10 mg total) by mouth daily. 60 tablet 1   escitalopram  (LEXAPRO ) 20 MG tablet TAKE 1 TABLET BY MOUTH ONCE DAILY 90 tablet 0   famotidine  (PEPCID ) 20 MG tablet One after  supper 30 tablet 11   furosemide  (LASIX ) 40 MG tablet Take 1 tablet (40 mg total) by mouth daily. 60 tablet 1   gabapentin  (NEURONTIN ) 300 MG capsule TAKE ONE (1) CAPSULE BY MOUTH TWICE DAILY 60 capsule 10   guaiFENesin -codeine  100-10 MG/5ML syrup Take 10 mLs by mouth every 4 (four) hours as needed for cough. 237 mL 0   ipratropium-albuterol  (DUONEB) 0.5-2.5 (3) MG/3ML SOLN Take 3 mLs by nebulization 2 (two) times daily. 180 mL 1   metFORMIN  (GLUCOPHAGE -XR) 500 MG 24 hr tablet TAKE 1 TABLET BY MOUTH ONCE DAILY 90 tablet 0   montelukast  (SINGULAIR ) 10 MG tablet TAKE 1 TABLET BY MOUTH AT BEDTIME 30  tablet 10   naloxone  (NARCAN ) nasal spray 4 mg/0.1 mL Inject intranasal for overdose 1 each 1   pantoprazole  (PROTONIX ) 40 MG tablet Take 1 tablet (40 mg total) by mouth daily. Take 30-60 min before first meal of the day 30 tablet 2   predniSONE  (DELTASONE ) 20 MG tablet Take 40mg  for 2days then 20mg  for 2days then STOP 8 tablet 0   spironolactone  (ALDACTONE ) 25 MG tablet TAKE 1 TABLET BY MOUTH ONCE DAILY 30 tablet 10   SYMBICORT  80-4.5 MCG/ACT inhaler INHALE TWO (2) PUFFS BY MOUTH TWICE DAILY AS NEEDED FOR SHORTNESS OF BREATH AND/OR WHEEZING 10.2 g 10   valsartan  (DIOVAN ) 40 MG tablet Take 1 tablet (40 mg total) by mouth daily. 30 tablet 11   No current facility-administered medications for this visit.   There were no vitals filed for this visit.   Wt Readings from Last 3 Encounters:  03/05/24 64.7 kg (142 lb 9.6 oz)  03/03/24 62.3 kg (137 lb 5.6 oz)  12/21/23 68.4 kg (150 lb 12.8 oz)     PHYSICAL EXAM: General:  Chronically ill appearing. No distress. Neck: Jvp 8-9.  Cor: Regular rate & rhythm. No rubs, gallops or murmurs. Lungs: clear Abdomen: soft, nontender, nondistended.  Neuro: alert & orientedx3. Affect pleasant  ECG NSR 73 bpm  ASSESSMENT & PLAN:  1) Chronic systolic HF:   - Nonischemic cardiomyopathy.  - EF 30-35% in 2014 - Cath 8/15 showed normal cors  - Echo 6/19 EF 55-65%  - Echo 06/23: EF down to 30-35% on echo with normal RV, severe BAE, moderate to severe MR, moderate to severe TR and moderate to severe AI - Echo 10/24: EF 40-45%, RV okay, PASP 65 mmHg, moderate to severe MR, mild to moderate TR, moderate AI - NYHA III. Appears volume up on exam and by ReDS which is 37%. Takes 40 mg lasix  daily. Instructed her to take an extra 40 mg for the next 2 days. - Start losartan  25 mg daily - Continue spiro 25 mg daily - Continue Farxiga  10 mg daily - Continue Coreg  3.125 mg BID - Suspect drop in EF d/t combination of crack/cocaine  use and noncompliance. She would be  good for Paramedicine but is not in [facility] network. Her caregiver is helping her with medications.  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 Reports drug use helps her cope with depression.  - HF CSW to see today to  provide resources. Also recommended f/u with PCP regarding depression.  5) Tobacco Abuse -Continues to smoke. Cessation advised  SDOH: Has medicaid. Receives disability check to pay for housing. May be evicted from house in next few months. Having trouble paying power pill. No food insecurity.  - Discussed with HF CSW - will see to complete full SDOH screen  Follow-up 4 weeks with APP  Ani Deoliveira M Kassadi Presswood PA-C 5:00 PM

## 2024-03-14 ENCOUNTER — Telehealth (HOSPITAL_COMMUNITY): Payer: Self-pay

## 2024-03-14 NOTE — Telephone Encounter (Signed)
 Called to confirm/remind patient of their appointment at the [facility] Clinic on 03/15/24.   Appointment:   [] Confirmed  [] Left mess   [] No answer/No voice mail  [] VM Full/unable to leave message  [x] Phone not in service

## 2024-03-15 ENCOUNTER — Encounter (HOSPITAL_COMMUNITY)

## 2024-03-26 NOTE — Telephone Encounter (Signed)
 I called the pt and there was again, no answer- LMTCB and will close encounter per protocol.

## 2024-03-27 ENCOUNTER — Encounter: Payer: Self-pay | Admitting: Pharmacist

## 2024-03-27 ENCOUNTER — Other Ambulatory Visit (HOSPITAL_BASED_OUTPATIENT_CLINIC_OR_DEPARTMENT_OTHER): Admitting: Pharmacist

## 2024-03-27 DIAGNOSIS — E119 Type 2 diabetes mellitus without complications: Secondary | ICD-10-CM

## 2024-03-27 DIAGNOSIS — E7841 Elevated Lipoprotein(a): Secondary | ICD-10-CM

## 2024-03-27 DIAGNOSIS — Z7984 Long term (current) use of oral hypoglycemic drugs: Secondary | ICD-10-CM

## 2024-03-27 MED ORDER — METFORMIN HCL ER 500 MG PO TB24: 500.0000 mg | ORAL_TABLET | Freq: Every day | ORAL | 1 refills | Status: DC

## 2024-03-27 MED ORDER — ATORVASTATIN CALCIUM 40 MG PO TABS: 40.0000 mg | ORAL_TABLET | Freq: Every day | ORAL | 1 refills | Status: DC

## 2024-03-27 NOTE — Progress Notes (Signed)
 Pharmacy Quality Measure Review  This patient is appearing on a report for being at risk of failing the adherence measure for cholesterol (statin) and diabetes medications this calendar year.   Medication: atorvastatin  Last fill date: 02/26/24 for 30 day supply  Medication: metformin  Last fill date: 02/26/24 for 30 day supply  Patient now appears to use [facility]. I sent 90-day rxns to the pharmacy for her.   Marene Shape, PharmD, Becky Bowels, CPP Clinical Pharmacist [facility] [phone]

## 2024-03-29 ENCOUNTER — Ambulatory Visit: Payer: Self-pay

## 2024-03-29 NOTE — Telephone Encounter (Signed)
 Nurse disconnected call from patient in order for patient to call daughter in law to take to ED: nurse waiting a few minutes and called pt back to check status.  Pt informed nurse that daughter in law on her way there to take pt to ED.

## 2024-03-29 NOTE — Telephone Encounter (Signed)
 FYI Only or Action Required?: FYI only for provider  Patient was last seen in primary care on 10/19/2023 by Marius Siemens, NP. Called Nurse Triage reporting Respiratory Distress. Symptoms began today. Interventions attempted: Nothing. Symptoms are: gradually worsening.  Triage Disposition: Call EMS 911 Now  Patient/caregiver understands and will follow disposition?: Yes  Copied from CRM [id number]. Topic: Clinical - Red Word Triage >> Mar 29, 2024  1:29 PM Fredrica W wrote: Red Word that prompted transfer to Nurse Triage: shortness of breath, really bad headache  take 14 pills a day hasn't taken any in two weeks - lost medication.   Agent called nurse to provide pt information but before agent able to transfer call pt disconnected.   Reason for Disposition . SEVERE difficulty breathing (e.g., struggling for each breath, speaks in single words)    Pt barely able to speak without taking extra breaths, pt breathing sounds labored as if distress.  Advised pt to call 911 : pt stated she is going to attempt to call daughter in law to take her to hospital . [1] MILD-MODERATE headache AND [2] present > 72 hours . [1] SEVERE headache (e.g., excruciating) AND [2] not improved after 2 hours of pain medicine  Answer Assessment - Initial Assessment Questions 1. RESPIRATORY STATUS: "Describe your breathing?" (e.g., wheezing, shortness of breath, unable to speak, severe coughing)      SOB, coughing, unable to speak in complete sentences, labored breathing, wheezing 2. ONSET: "When did this breathing problem begin?"      Ongoing and worse today without medication 3. PATTERN "Does the difficult breathing come and go, or has it been constant since it started?"      constant 4. SEVERITY: "How bad is your breathing?" (e.g., mild, moderate, severe)    - MILD: No SOB at rest, mild SOB with walking, speaks normally in sentences, can lie down, no retractions, pulse < 100.    - MODERATE: SOB at rest, SOB with  minimal exertion and prefers to sit, cannot lie down flat, speaks in phrases, mild retractions, audible wheezing, pulse 100-120.    - SEVERE: Very SOB at rest, speaks in single words, struggling to breathe, sitting hunched forward, retractions, pulse > 120      severe 5. RECURRENT SYMPTOM: "Have you had difficulty breathing before?" If Yes, ask: "When was the last time?" and "What happened that time?"      no 6. CARDIAC HISTORY: "Do you have any history of heart disease?" (e.g., heart attack, angina, bypass surgery, angioplasty)      Heart issues 7. LUNG HISTORY: "Do you have any history of lung disease?"  (e.g., pulmonary embolus, asthma, emphysema)     N/a 8. CAUSE: "What do you think is causing the breathing problem?"      Have not been  9. OTHER SYMPTOMS: "Do you have any other symptoms? (e.g., dizziness, runny nose, cough, chest pain, fever)     Headache, SOB all the time and worsening - not using any inhalers  10. O2 SATURATION MONITOR:  "Do you use an oxygen saturation monitor (pulse oximeter) at home?" If Yes, ask: "What is your reading (oxygen level) today?" "What is your usual oxygen saturation reading?" (e.g., 95%)       no 11. PREGNANCY: "Is there any chance you are pregnant?" "When was your last menstrual period?"       N/a 12. TRAVEL: "Have you traveled out of the country in the last month?" (e.g., travel history, exposures)  N/a  Answer Assessment - Initial Assessment Questions 1. LOCATION: "Where does it hurt?"      headache 2. ONSET: "When did the headache start?" (Minutes, hours or days)      today 3. PATTERN: "Does the pain come and go, or has it been constant since it started?"     constant 4. SEVERITY: "How bad is the pain?" and "What does it keep you from doing?"  (e.g., Scale 1-10; mild, moderate, or severe)   - MILD (1-3): doesn't interfere with normal activities    - MODERATE (4-7): interferes with normal activities or awakens from sleep    - SEVERE (8-10):  excruciating pain, unable to do any normal activities        8.5 pain: has not taken anything for the pain. 5. RECURRENT SYMPTOM: "Have you ever had headaches before?" If Yes, ask: "When was the last time?" and "What happened that time?"      N/a 6. CAUSE: "What do you think is causing the headache?"     Possible related to not taking medication 7. MIGRAINE: "Have you been diagnosed with migraine headaches?" If Yes, ask: "Is this headache similar?"      N/a 8. HEAD INJURY: "Has there been any recent injury to the head?"      N/a 9. OTHER SYMPTOMS: "Do you have any other symptoms?" (fever, stiff neck, eye pain, sore throat, cold symptoms)     SOB 10. PREGNANCY: "Is there any chance you are pregnant?" "When was your last menstrual period?"       N/a  Protocols used: Breathing Difficulty-A-AH, Headache-A-AH

## 2024-04-02 NOTE — Telephone Encounter (Signed)
 Will forward to provider

## 2024-04-03 ENCOUNTER — Other Ambulatory Visit (HOSPITAL_BASED_OUTPATIENT_CLINIC_OR_DEPARTMENT_OTHER): Admitting: Pharmacist

## 2024-04-03 DIAGNOSIS — E7800 Pure hypercholesterolemia, unspecified: Secondary | ICD-10-CM

## 2024-04-03 DIAGNOSIS — I10 Essential (primary) hypertension: Secondary | ICD-10-CM

## 2024-04-03 NOTE — Progress Notes (Signed)
 Pharmacy Quality Measure Review  This patient is appearing on a report for being at risk of failing the adherence measure for cholesterol (statin) and diabetes medications this calendar year.   Medication: atorvastatin  Last fill date: 03/20/24 for 30 day supply  Medication: metformin  Last fill date: 03/20/2024 for 30 day supply  Patient now appears to use [facility]. I sent 90-day rxns to the pharmacy for her. Reminders set to call [facility] when next fills are due.   Marene Shape, PharmD, Becky Bowels, CPP Clinical Pharmacist [facility] [phone]

## 2024-04-03 NOTE — Telephone Encounter (Signed)
 TY for forwarding to ED

## 2024-04-05 ENCOUNTER — Other Ambulatory Visit: Payer: Self-pay

## 2024-04-05 ENCOUNTER — Inpatient Hospital Stay (HOSPITAL_COMMUNITY)
Admission: EM | Admit: 2024-04-05 | Discharge: 2024-04-10 | DRG: 291 | Disposition: A | Discharge status: Skilled Nursing Facility | Attending: Internal Medicine | Admitting: Internal Medicine

## 2024-04-05 ENCOUNTER — Emergency Department (HOSPITAL_COMMUNITY)

## 2024-04-05 ENCOUNTER — Encounter (HOSPITAL_COMMUNITY): Payer: Self-pay

## 2024-04-05 DIAGNOSIS — K838 Other specified diseases of biliary tract: Secondary | ICD-10-CM | POA: Diagnosis not present

## 2024-04-05 DIAGNOSIS — K219 Gastro-esophageal reflux disease without esophagitis: Secondary | ICD-10-CM | POA: Diagnosis present

## 2024-04-05 DIAGNOSIS — L304 Erythema intertrigo: Secondary | ICD-10-CM | POA: Diagnosis present

## 2024-04-05 DIAGNOSIS — J9691 Respiratory failure, unspecified with hypoxia: Secondary | ICD-10-CM | POA: Diagnosis not present

## 2024-04-05 DIAGNOSIS — R531 Weakness: Secondary | ICD-10-CM | POA: Diagnosis not present

## 2024-04-05 DIAGNOSIS — I509 Heart failure, unspecified: Secondary | ICD-10-CM | POA: Diagnosis not present

## 2024-04-05 DIAGNOSIS — I5022 Chronic systolic (congestive) heart failure: Secondary | ICD-10-CM | POA: Diagnosis present

## 2024-04-05 DIAGNOSIS — F17200 Nicotine dependence, unspecified, uncomplicated: Secondary | ICD-10-CM | POA: Diagnosis present

## 2024-04-05 DIAGNOSIS — N179 Acute kidney failure, unspecified: Secondary | ICD-10-CM | POA: Diagnosis not present

## 2024-04-05 DIAGNOSIS — I110 Hypertensive heart disease with heart failure: Principal | ICD-10-CM | POA: Diagnosis present

## 2024-04-05 DIAGNOSIS — T502X5A Adverse effect of carbonic-anhydrase inhibitors, benzothiadiazides and other diuretics, initial encounter: Secondary | ICD-10-CM | POA: Diagnosis not present

## 2024-04-05 DIAGNOSIS — E785 Hyperlipidemia, unspecified: Secondary | ICD-10-CM | POA: Diagnosis present

## 2024-04-05 DIAGNOSIS — E119 Type 2 diabetes mellitus without complications: Secondary | ICD-10-CM | POA: Diagnosis present

## 2024-04-05 DIAGNOSIS — I351 Nonrheumatic aortic (valve) insufficiency: Secondary | ICD-10-CM | POA: Diagnosis not present

## 2024-04-05 DIAGNOSIS — Z7951 Long term (current) use of inhaled steroids: Secondary | ICD-10-CM

## 2024-04-05 DIAGNOSIS — I10 Essential (primary) hypertension: Secondary | ICD-10-CM | POA: Diagnosis present

## 2024-04-05 DIAGNOSIS — R0603 Acute respiratory distress: Secondary | ICD-10-CM | POA: Diagnosis present

## 2024-04-05 DIAGNOSIS — Z91148 Patient's other noncompliance with medication regimen for other reason: Secondary | ICD-10-CM | POA: Diagnosis not present

## 2024-04-05 DIAGNOSIS — J441 Chronic obstructive pulmonary disease with (acute) exacerbation: Secondary | ICD-10-CM | POA: Diagnosis present

## 2024-04-05 DIAGNOSIS — E1165 Type 2 diabetes mellitus with hyperglycemia: Secondary | ICD-10-CM | POA: Diagnosis not present

## 2024-04-05 DIAGNOSIS — I5023 Acute on chronic systolic (congestive) heart failure: Secondary | ICD-10-CM | POA: Diagnosis present

## 2024-04-05 DIAGNOSIS — J189 Pneumonia, unspecified organism: Secondary | ICD-10-CM | POA: Diagnosis not present

## 2024-04-05 DIAGNOSIS — J129 Viral pneumonia, unspecified: Principal | ICD-10-CM | POA: Diagnosis present

## 2024-04-05 DIAGNOSIS — I340 Nonrheumatic mitral (valve) insufficiency: Secondary | ICD-10-CM | POA: Diagnosis present

## 2024-04-05 DIAGNOSIS — Z66 Do not resuscitate: Secondary | ICD-10-CM | POA: Diagnosis not present

## 2024-04-05 DIAGNOSIS — D72829 Elevated white blood cell count, unspecified: Secondary | ICD-10-CM | POA: Diagnosis not present

## 2024-04-05 DIAGNOSIS — Z7984 Long term (current) use of oral hypoglycemic drugs: Secondary | ICD-10-CM

## 2024-04-05 DIAGNOSIS — Z1152 Encounter for screening for COVID-19: Secondary | ICD-10-CM | POA: Diagnosis not present

## 2024-04-05 DIAGNOSIS — J440 Chronic obstructive pulmonary disease with acute lower respiratory infection: Secondary | ICD-10-CM | POA: Diagnosis present

## 2024-04-05 DIAGNOSIS — Z743 Need for continuous supervision: Secondary | ICD-10-CM | POA: Diagnosis not present

## 2024-04-05 DIAGNOSIS — G9341 Metabolic encephalopathy: Secondary | ICD-10-CM | POA: Diagnosis not present

## 2024-04-05 DIAGNOSIS — Z560 Unemployment, unspecified: Secondary | ICD-10-CM | POA: Diagnosis not present

## 2024-04-05 DIAGNOSIS — L299 Pruritus, unspecified: Secondary | ICD-10-CM | POA: Diagnosis not present

## 2024-04-05 DIAGNOSIS — Z79899 Other long term (current) drug therapy: Secondary | ICD-10-CM

## 2024-04-05 DIAGNOSIS — I517 Cardiomegaly: Secondary | ICD-10-CM | POA: Diagnosis not present

## 2024-04-05 DIAGNOSIS — N951 Menopausal and female climacteric states: Secondary | ICD-10-CM | POA: Diagnosis present

## 2024-04-05 DIAGNOSIS — Z7401 Bed confinement status: Secondary | ICD-10-CM | POA: Diagnosis not present

## 2024-04-05 DIAGNOSIS — R031 Nonspecific low blood-pressure reading: Secondary | ICD-10-CM | POA: Insufficient documentation

## 2024-04-05 DIAGNOSIS — J449 Chronic obstructive pulmonary disease, unspecified: Secondary | ICD-10-CM | POA: Diagnosis not present

## 2024-04-05 DIAGNOSIS — R17 Unspecified jaundice: Secondary | ICD-10-CM | POA: Diagnosis not present

## 2024-04-05 DIAGNOSIS — R7989 Other specified abnormal findings of blood chemistry: Secondary | ICD-10-CM | POA: Diagnosis not present

## 2024-04-05 DIAGNOSIS — J9601 Acute respiratory failure with hypoxia: Secondary | ICD-10-CM | POA: Diagnosis present

## 2024-04-05 DIAGNOSIS — K5900 Constipation, unspecified: Secondary | ICD-10-CM | POA: Diagnosis not present

## 2024-04-05 DIAGNOSIS — M5416 Radiculopathy, lumbar region: Secondary | ICD-10-CM | POA: Diagnosis not present

## 2024-04-05 DIAGNOSIS — R1011 Right upper quadrant pain: Secondary | ICD-10-CM | POA: Diagnosis not present

## 2024-04-05 DIAGNOSIS — R519 Headache, unspecified: Secondary | ICD-10-CM | POA: Diagnosis not present

## 2024-04-05 DIAGNOSIS — I700 Atherosclerosis of aorta: Secondary | ICD-10-CM | POA: Diagnosis not present

## 2024-04-05 DIAGNOSIS — R918 Other nonspecific abnormal finding of lung field: Secondary | ICD-10-CM | POA: Diagnosis not present

## 2024-04-05 LAB — CBC
HCT: 41.4 % (ref 36.0–46.0)
Hemoglobin: 12.8 g/dL (ref 12.0–15.0)
MCH: 31.1 pg (ref 26.0–34.0)
MCHC: 30.9 g/dL (ref 30.0–36.0)
MCV: 100.7 fL — ABNORMAL HIGH (ref 80.0–100.0)
Platelets: 222 10*3/uL (ref 150–400)
RBC: 4.11 MIL/uL (ref 3.87–5.11)
RDW: 13.4 % (ref 11.5–15.5)
WBC: 10.9 10*3/uL — ABNORMAL HIGH (ref 4.0–10.5)
nRBC: 0 % (ref 0.0–0.2)

## 2024-04-05 LAB — I-STAT VENOUS BLOOD GAS, ED
Acid-base deficit: 1 mmol/L (ref 0.0–2.0)
Bicarbonate: 25 mmol/L (ref 20.0–28.0)
Calcium, Ion: 1.22 mmol/L (ref 1.15–1.40)
HCT: 39 % (ref 36.0–46.0)
Hemoglobin: 13.3 g/dL (ref 12.0–15.0)
O2 Saturation: 79 %
Potassium: 5 mmol/L (ref 3.5–5.1)
Sodium: 141 mmol/L (ref 135–145)
TCO2: 26 mmol/L (ref 22–32)
pCO2, Ven: 44 mmHg (ref 44–60)
pH, Ven: 7.363 (ref 7.25–7.43)
pO2, Ven: 45 mmHg (ref 32–45)

## 2024-04-05 LAB — RESP PANEL BY RT-PCR (RSV, FLU A&B, COVID)  RVPGX2
Influenza A by PCR: NEGATIVE
Influenza B by PCR: NEGATIVE
Resp Syncytial Virus by PCR: NEGATIVE
SARS Coronavirus 2 by RT PCR: NEGATIVE

## 2024-04-05 LAB — TROPONIN I (HIGH SENSITIVITY)
Troponin I (High Sensitivity): 28 ng/L — ABNORMAL HIGH (ref <18)
Troponin I (High Sensitivity): 20 ng/L — ABNORMAL HIGH (ref <18)

## 2024-04-05 LAB — I-STAT CHEM 8, ED
BUN: 32 mg/dL — ABNORMAL HIGH (ref 6–20)
Calcium, Ion: 1.21 mmol/L (ref 1.15–1.40)
Chloride: 109 mmol/L (ref 98–111)
Creatinine, Ser: 0.9 mg/dL (ref 0.44–1.00)
Glucose, Bld: 118 mg/dL — ABNORMAL HIGH (ref 70–99)
HCT: 40 % (ref 36.0–46.0)
Hemoglobin: 13.6 g/dL (ref 12.0–15.0)
Potassium: 5 mmol/L (ref 3.5–5.1)
Sodium: 141 mmol/L (ref 135–145)
TCO2: 24 mmol/L (ref 22–32)

## 2024-04-05 LAB — BASIC METABOLIC PANEL WITH GFR
Anion gap: 13 (ref 5–15)
BUN: 21 mg/dL — ABNORMAL HIGH (ref 6–20)
CO2: 25 mmol/L (ref 22–32)
Calcium: 8.9 mg/dL (ref 8.9–10.3)
Chloride: 105 mmol/L (ref 98–111)
Creatinine, Ser: 0.84 mg/dL (ref 0.44–1.00)
GFR, Estimated: >60 mL/min (ref >60)
Glucose, Bld: 122 mg/dL — ABNORMAL HIGH (ref 70–99)
Potassium: 4.1 mmol/L (ref 3.5–5.1)
Sodium: 143 mmol/L (ref 135–145)

## 2024-04-05 LAB — BRAIN NATRIURETIC PEPTIDE: B Natriuretic Peptide: 919.6 pg/mL — ABNORMAL HIGH (ref 0.0–100.0)

## 2024-04-05 LAB — D-DIMER, QUANTITATIVE: D-Dimer, Quant: <0.27 ug{FEU}/mL (ref 0.00–0.50)

## 2024-04-05 LAB — MAGNESIUM: Magnesium: 2 mg/dL (ref 1.7–2.4)

## 2024-04-05 LAB — PROCALCITONIN: Procalcitonin: <0.1 ng/mL

## 2024-04-05 MED ORDER — POLYETHYLENE GLYCOL 3350 17 G PO PACK: 17.0000 g | PACK | Freq: Every day | ORAL | Status: DC | PRN

## 2024-04-05 MED ORDER — FLUTICASONE FUROATE-VILANTEROL 100-25 MCG/ACT IN AEPB
1.0000 | INHALATION_SPRAY | Freq: Every day | RESPIRATORY_TRACT | Status: DC
  Administered 2024-04-05 – 2024-04-10 (×4): 1 via RESPIRATORY_TRACT
  Filled 2024-04-05 (×3): qty 28

## 2024-04-05 MED ORDER — ACETAMINOPHEN 650 MG RE SUPP: 650.0000 mg | Freq: Four times a day (QID) | RECTAL | Status: DC | PRN

## 2024-04-05 MED ORDER — IPRATROPIUM-ALBUTEROL 0.5-2.5 (3) MG/3ML IN SOLN
RESPIRATORY_TRACT | Status: AC
  Filled 2024-04-05: qty 3

## 2024-04-05 MED ORDER — ENOXAPARIN SODIUM 40 MG/0.4ML IJ SOSY
40.0000 mg | PREFILLED_SYRINGE | INTRAMUSCULAR | Status: DC
  Administered 2024-04-06 – 2024-04-09 (×5): 40 mg via SUBCUTANEOUS
  Filled 2024-04-05 (×5): qty 0.4

## 2024-04-05 MED ORDER — SODIUM CHLORIDE 0.9 % IV SOLN
2.0000 g | INTRAVENOUS | Status: DC
  Administered 2024-04-06: 2 g via INTRAVENOUS
  Filled 2024-04-05 (×2): qty 20

## 2024-04-05 MED ORDER — CEFTRIAXONE SODIUM 1 G IJ SOLR
1.0000 g | Freq: Two times a day (BID) | INTRAMUSCULAR | Status: DC
  Administered 2024-04-05: 1 g via INTRAVENOUS
  Filled 2024-04-05: qty 10

## 2024-04-05 MED ORDER — ATORVASTATIN CALCIUM 40 MG PO TABS
40.0000 mg | ORAL_TABLET | Freq: Every day | ORAL | Status: DC
  Administered 2024-04-06 – 2024-04-10 (×5): 40 mg via ORAL
  Filled 2024-04-05 (×5): qty 1

## 2024-04-05 MED ORDER — AZITHROMYCIN 500 MG IV SOLR
500.0000 mg | Freq: Once | INTRAVENOUS | Status: AC
  Administered 2024-04-05: 500 mg via INTRAVENOUS
  Filled 2024-04-05: qty 5

## 2024-04-05 MED ORDER — DAPAGLIFLOZIN PROPANEDIOL 10 MG PO TABS
10.0000 mg | ORAL_TABLET | Freq: Every day | ORAL | Status: DC
  Administered 2024-04-05 – 2024-04-10 (×6): 10 mg via ORAL
  Filled 2024-04-05 (×6): qty 1

## 2024-04-05 MED ORDER — FUROSEMIDE 10 MG/ML IJ SOLN
40.0000 mg | Freq: Once | INTRAMUSCULAR | Status: AC
  Administered 2024-04-05: 40 mg via INTRAVENOUS
  Filled 2024-04-05: qty 4

## 2024-04-05 MED ORDER — GUAIFENESIN-CODEINE 100-10 MG/5ML PO SOLN
10.0000 mL | ORAL | Status: DC | PRN
  Administered 2024-04-06 – 2024-04-07 (×3): 10 mL via ORAL
  Filled 2024-04-05 (×3): qty 10

## 2024-04-05 MED ORDER — IPRATROPIUM-ALBUTEROL 0.5-2.5 (3) MG/3ML IN SOLN
3.0000 mL | Freq: Once | RESPIRATORY_TRACT | Status: AC
  Administered 2024-04-05: 3 mL via RESPIRATORY_TRACT

## 2024-04-05 MED ORDER — METHYLPREDNISOLONE SODIUM SUCC 125 MG IJ SOLR: 125.0000 mg | Freq: Once | INTRAMUSCULAR | Status: DC

## 2024-04-05 MED ORDER — ONDANSETRON HCL 4 MG/2ML IJ SOLN
4.0000 mg | Freq: Once | INTRAMUSCULAR | Status: AC
  Administered 2024-04-05: 4 mg via INTRAVENOUS
  Filled 2024-04-05: qty 2

## 2024-04-05 MED ORDER — AZITHROMYCIN 250 MG PO TABS
500.0000 mg | ORAL_TABLET | Freq: Every day | ORAL | Status: AC
  Administered 2024-04-06 – 2024-04-07 (×2): 500 mg via ORAL
  Filled 2024-04-05 (×2): qty 2

## 2024-04-05 MED ORDER — LIDOCAINE 5 % EX PTCH
2.0000 | MEDICATED_PATCH | CUTANEOUS | Status: DC
  Administered 2024-04-06 – 2024-04-07 (×2): 2 via TRANSDERMAL
  Filled 2024-04-05 (×3): qty 2

## 2024-04-05 MED ORDER — ACETAMINOPHEN 325 MG PO TABS
650.0000 mg | ORAL_TABLET | Freq: Four times a day (QID) | ORAL | Status: DC | PRN
  Administered 2024-04-05: 650 mg via ORAL
  Filled 2024-04-05: qty 2

## 2024-04-05 MED ORDER — MONTELUKAST SODIUM 10 MG PO TABS
10.0000 mg | ORAL_TABLET | Freq: Every day | ORAL | Status: DC
  Administered 2024-04-06 – 2024-04-09 (×4): 10 mg via ORAL
  Filled 2024-04-05 (×4): qty 1

## 2024-04-05 MED ORDER — IPRATROPIUM-ALBUTEROL 0.5-2.5 (3) MG/3ML IN SOLN: 3.0000 mL | RESPIRATORY_TRACT | Status: DC | PRN

## 2024-04-05 MED ORDER — PANTOPRAZOLE SODIUM 40 MG PO TBEC
40.0000 mg | DELAYED_RELEASE_TABLET | Freq: Every day | ORAL | Status: DC
  Administered 2024-04-06 – 2024-04-10 (×5): 40 mg via ORAL
  Filled 2024-04-05 (×5): qty 1

## 2024-04-05 MED ORDER — ACETAMINOPHEN 500 MG PO TABS
1000.0000 mg | ORAL_TABLET | Freq: Four times a day (QID) | ORAL | Status: DC | PRN
  Administered 2024-04-06 – 2024-04-07 (×4): 1000 mg via ORAL
  Filled 2024-04-05 (×4): qty 2

## 2024-04-05 NOTE — ED Provider Notes (Signed)
 [facility] AT [facility] Provider Note   CSN: [id number] Arrival date & time: 04/05/24  1002     Patient presents with: Respiratory Distress   Stacey Lin is a 58 y.o. female.   Pt complains of shortness of breath.  Patient reports symptoms started today.  Patient has a past medical history of congestive heart failure and COPD.  Patient reported to EMS that she has been out of her medications for the past month.  Patient denies any fever or chills.  She has had a cough.  Patient complains of congestion.  Patient has a past medical history of asthmatic bronchitis COPD respiratory failure.  Patient has a past medical history of hypertension mitral valve regurgitation congestive heart failure anemia acute kidney injury COPD type 2 diabetes.  Patient has a past medical history of polysubstance abuse and medical noncompliance.  The history is provided by the patient. No language interpreter was used.       Prior to Admission medications   Medication Sig Start Date End Date Taking? Authorizing Provider  albuterol  (VENTOLIN  HFA) 108 (90 Base) MCG/ACT inhaler Inhale 2 puffs into the lungs every 4 (four) hours as needed for wheezing or shortness of breath. 03/03/24   Deforest Fast, MD  ammonium lactate  (LAC-HYDRIN ) 12 % lotion Apply 1 Application topically as needed for dry skin. 03/13/24   Luella Sager, DPM  atorvastatin  (LIPITOR) 40 MG tablet Take 1 tablet (40 mg total) by mouth daily. 03/27/24   Newlin, Enobong, MD  carvedilol  (COREG ) 3.125 MG tablet Take 1 tablet (3.125 mg total) by mouth 2 (two) times daily with a meal. 12/28/23   Bensimhon, Rheta Celestine, MD  dapagliflozin  propanediol (FARXIGA ) 10 MG TABS tablet Take 1 tablet (10 mg total) by mouth daily. 03/03/24   Deforest Fast, MD  escitalopram  (LEXAPRO ) 20 MG tablet TAKE 1 TABLET BY MOUTH ONCE DAILY 02/21/24   Marius Siemens, NP  famotidine  (PEPCID ) 20 MG tablet One after supper 03/05/24   Diamond Formica, MD  furosemide  (LASIX ) 40 MG tablet Take 1 tablet (40 mg total) by mouth daily. 03/03/24   Deforest Fast, MD  gabapentin  (NEURONTIN ) 300 MG capsule TAKE ONE (1) CAPSULE BY MOUTH TWICE DAILY 09/11/23   Marius Siemens, NP  guaiFENesin -codeine  100-10 MG/5ML syrup Take 10 mLs by mouth every 4 (four) hours as needed for cough. 03/05/24   Diamond Formica, MD  ipratropium-albuterol  (DUONEB) 0.5-2.5 (3) MG/3ML SOLN Take 3 mLs by nebulization 2 (two) times daily. 03/03/24   Deforest Fast, MD  metFORMIN  (GLUCOPHAGE -XR) 500 MG 24 hr tablet Take 1 tablet (500 mg total) by mouth daily. 03/27/24   Newlin, Enobong, MD  montelukast  (SINGULAIR ) 10 MG tablet TAKE 1 TABLET BY MOUTH AT BEDTIME 09/11/23   Marius Siemens, NP  naloxone  (NARCAN ) nasal spray 4 mg/0.1 mL Inject intranasal for overdose 04/29/23   Ninetta Basket, MD  pantoprazole  (PROTONIX ) 40 MG tablet Take 1 tablet (40 mg total) by mouth daily. Take 30-60 min before first meal of the day 03/05/24   Diamond Formica, MD  predniSONE  (DELTASONE ) 20 MG tablet Take 40mg  for 2days then 20mg  for 2days then STOP 03/03/24   Deforest Fast, MD  spironolactone  (ALDACTONE ) 25 MG tablet TAKE 1 TABLET BY MOUTH ONCE DAILY 08/28/23   Bensimhon, Rheta Celestine, MD  SYMBICORT  80-4.5 MCG/ACT inhaler INHALE TWO (2) PUFFS BY MOUTH TWICE DAILY AS NEEDED FOR SHORTNESS OF BREATH AND/OR WHEEZING 08/14/23   Marius Siemens,  NP  valsartan  (DIOVAN ) 40 MG tablet Take 1 tablet (40 mg total) by mouth daily. 03/05/24   Diamond Formica, MD    Allergies: Patient has no known allergies.    Review of Systems  All other systems reviewed and are negative.   Updated Vital Signs BP 138/85   Pulse 94   Resp 17   LMP  (LMP Unknown)   SpO2 100%   Physical Exam Vitals and nursing note reviewed.  Constitutional:      Appearance: She is well-developed.  HENT:     Head: Normocephalic.     Nose: Nose normal.     Mouth/Throat:     Mouth: Mucous membranes are moist.    Cardiovascular:     Rate and Rhythm: Tachycardia present.  Pulmonary:     Breath sounds: Wheezing and rhonchi present.     Comments: Tachypnea Abdominal:     General: There is no distension.   Musculoskeletal:        General: Normal range of motion.   Skin:    General: Skin is warm.   Neurological:     General: No focal deficit present.     Mental Status: She is alert and oriented to person, place, and time.     (all labs ordered are listed, but only abnormal results are displayed) Labs Reviewed  CBC - Abnormal; Notable for the following components:      Result Value   WBC 10.9 (*)    MCV 100.7 (*)    All other components within normal limits  BRAIN NATRIURETIC PEPTIDE - Abnormal; Notable for the following components:   B Natriuretic Peptide 919.6 (*)    All other components within normal limits  I-STAT CHEM 8, ED - Abnormal; Notable for the following components:   BUN 32 (*)    Glucose, Bld 118 (*)    All other components within normal limits  RESP PANEL BY RT-PCR (RSV, FLU A&B, COVID)  RVPGX2  D-DIMER, QUANTITATIVE  I-STAT VENOUS BLOOD GAS, ED  TROPONIN I (HIGH SENSITIVITY)  TROPONIN I (HIGH SENSITIVITY)    EKG: EKG Interpretation Date/Time:  Friday April 05 2024 10:28:20 EDT Ventricular Rate:  95 PR Interval:  169 QRS Duration:  91 QT Interval:  387 QTC Calculation: 487 R Axis:   -14  Text Interpretation: Sinus rhythm Left atrial enlargement Left ventricular hypertrophy Nonspecific T abnrm, anterolateral leads Borderline prolonged QT interval Similar to previous Confirmed by Florentino Hurdle [phone]) on 04/05/2024 11:04:13 AM  Radiology: Lenell Query Chest Portable 1 View Result Date: 04/05/2024 CLINICAL DATA:  resp distress EXAM: PORTABLE CHEST - 1 VIEW COMPARISON:  Mar 05, 2024 FINDINGS: Patchy airspace opacities in both lung bases. No pleural effusion or pneumothorax. Mild cardiomegaly. Tortuous aorta with aortic atherosclerosis. No acute fracture or destructive  lesion. Multilevel thoracic osteophytosis. IMPRESSION: Patchy airspace opacities in both lung bases, which may represent atelectasis or developing bronchopneumonia, in the correct clinical context. Electronically Signed   By: Rance Burrows M.D.   On: 04/05/2024 11:11     .Critical Care  Performed by: Sandi Crosby, PA-C Authorized by: Sandi Crosby, PA-C   Critical care provider statement:    Critical care time (minutes):  45   Critical care start time:  04/05/2024 10:10 AM   Critical care end time:  04/05/2024 12:20 PM   Critical care was necessary to treat or prevent imminent or life-threatening deterioration of the following conditions:  Cardiac failure and respiratory failure   Critical care was  time spent personally by me on the following activities:  Discussions with consultants, evaluation of patient's response to treatment, interpretation of cardiac output measurements, obtaining history from patient or surrogate, vascular access procedures, review of old charts, re-evaluation of patient's condition, pulse oximetry, ordering and review of radiographic studies, ordering and review of laboratory studies and ordering and performing treatments and interventions   Care discussed with: admitting provider      Medications Ordered in the ED  ipratropium-albuterol  (DUONEB) 0.5-2.5 (3) MG/3ML nebulizer solution (  Not Given 04/05/24 1025)  cefTRIAXone  (ROCEPHIN ) 1 g in sodium chloride  0.9 % 100 mL IVPB (1 g Intravenous New Bag/Given 04/05/24 1201)  azithromycin  (ZITHROMAX ) 500 mg in sodium chloride  0.9 % 250 mL IVPB (has no administration in time range)  furosemide  (LASIX ) injection 40 mg (40 mg Intravenous Given 04/05/24 1019)  ipratropium-albuterol  (DUONEB) 0.5-2.5 (3) MG/3ML nebulizer solution 3 mL (3 mLs Nebulization Given 04/05/24 1021)                                    Medical Decision Making Patient complains of acute shortness of breath.  Patient has been without her medications  for the past month  Amount and/or Complexity of Data Reviewed Independent Historian: EMS    Details: Initial history is provided by EMS. Labs: ordered. Decision-making details documented in ED Course.    Details: Labs are reviewed and interpreted.  Patient has a CBC of 10.9.  BNP is 919 Radiology: ordered and independent interpretation performed. Decision-making details documented in ED Course.    Details: Chest x-ray shows patchy airspace opacities in both lungs. Discussion of management or test interpretation with external provider(s): Unassigned medicine consulted  Risk Prescription drug management. Risk Details: Patient is started on BiPAP.  She initially had some issues tolerating the BiPAP.  She was given a DuoNeb.  Patient is given Lasix  40 mg IV.  Patient is continuing to improve.  Chest x-ray questionable for pneumonia patient is given Zithromax  and Rocephin .  Medicine is consulted to admit patient.        Final diagnoses:  Pneumonia of both lungs due to infectious organism, unspecified part of lung  Congestive heart failure, unspecified HF chronicity, unspecified heart failure type [facility])    ED Discharge Orders     None          Sandi Crosby, [place] 04/05/24 1237    Horton, Sidra Dredge, DO 04/05/24 1526

## 2024-04-05 NOTE — Plan of Care (Signed)
 ?  Problem: Respiratory: ?Goal: Ability to maintain adequate ventilation will improve ?Outcome: Progressing ?Goal: Ability to maintain a clear airway will improve ?Outcome: Progressing ?  ?

## 2024-04-05 NOTE — Hospital Course (Addendum)
 Stacey Lin is a 58 y.o. female with PMH of previous substance use disorder, COPD/Asthma, HTN, HFrEF who presented with shortness of breath and admitted for acute respiratory distress secondary to heart failure exacerbation.   # Acute on chronic HFrEF On presentation, the patient was dyspneic and volume overloaded on exam. BNP was elevated to 920. Chest X-ray demonstrated patchy airspace opacities in both lungs. Transthoracic echocardiogram revealed a left ventricular ejection fraction (LVEF) of 30-35% (previously 40-45% on 10/24), with global hypokinesis and grade 1 diastolic dysfunction. EKG was normal. D-dimer was negative.  Heart failure exacerbation likely in the setting of mild COPD exacerbation, and pneumonia. The patient was treated with IV furosemide  for diuresis. Empiric antibiotics (ceftriaxone  and azithromycin ) were initiated for possible pneumonia; however, antibiotics were discontinued after a low procalcitonin level suggested a low likelihood of bacterial infection. valsartan  and spironolactone , was held due to acute kidney injury (AKI) and borderline low blood pressures.  # AKI Prerenal in setting of overdiuresis.  AKI resolved the day of discharge. - Still holding spironolactone  and valsartan  due to softer blood pressures.   # Constipation # Abdominal tenderness # Pruritus Diffuse pruritus, and tenderness in the right abdomen.  RUQ ultrasound showed normal biliary system, and biliary sludge without acute cholecystitis.  Normal LFTs on lab.  I think patient's abdominal pain is secondary to constipation, which has improved with MiraLAX  and senna.  Plan: - Hydroxyzine  25 mg. - CW senna and MiraLAX  for constipation.  # Hot flashes Per chart review, hot flashes has been on going since 2019.  I have low suspicion for infection as she is afebrile, with no leukocytosis.  Low suspicion for malignancy, as she has no lymphadenopathy or ongoing weight loss.  I suspect hot flashes are  secondary to Vasomotor symptoms of menopause.  Although her last menstrual cycle was about 8 years ago, some patient has vasomotor symptoms persist past 10 years after their last menstrual period. - Follow-up with PCP outpatient for hot flashes.   - Possible treatment regimen includes short course HRT versus paroxetine.  Uncomplicated intertrigo Pruritic erythematous papules and plaques under her breast.  No satellite lesions. -Will treat with clotrimazole  cream. - Encourage general improved skin hygiene and maintaining dry skin.  COPD exacerbation - Controlled with Breo Ellipta , and DuoNebs.   Chronic medical problems GERD: Continue pantoprazole  40 mg daily. Hyperlipidemia: Continue with Lipitor 40 mg daily.

## 2024-04-05 NOTE — H&P (Cosign Needed)
 Date: 04/05/2024               Patient Name:  Stacey Lin MRN: [id number]  DOB: 06/05/1966 Age / Sex: 58 y.o., female   PCP: Marius Siemens, NP         Medical Service: Internal Medicine Teaching Service         Attending Physician: Dr. Bevelyn Bryant, MD      First Contact: Dr. Lanney Pitts, DO Pager [phone]    Second Contact: Dr. Chrissie Coupe, MD          After Hours (After 5p/  First Contact Pager: [phone]  weekends / holidays): Second Contact Pager: [phone]   SUBJECTIVE   Chief Complaint: Shortness of breath   History of Present Illness:   [name] is a 58 year old female with past medical history of previous substance use disorder, COPD, chronic systolic HF with EF of 30-35% in May 2025, HLD who presents to the emergency department with shortness of breath. She states that she recently moved in with her son about three weeks ago, and lost her pill package of medications, so she hasn't been able to take anything besides her inhaler for the last three weeks. She noticed on Wednesday of this week that she felt short of breath walking short distances from the kitchen to the bathroom for example, and she would have to sit and rest for about 5 minutes. She does report intermittent subjective fevers and feeling hot and sweaty. She does endorse sub-sternal non-radiating chest tightness as well. She denies any current nausea or vomiting.   ED Course: Pt placed on BiPAP, given duoneb, and started on Zithromax  and Rocephin   Meds:  Albuterol  inhaler  Lipitor 40mg   Coreg  3.125mg  Farxiga  10mg  Lexapro  20mg  Symbicort  80-4.6mcg  Lasix  40mg   Gabapentin  300mg  BID Metformin  500mg  Pantoprazole  40mg  daily  Spironolactone  25mg   Valsartan  40mg   Past Medical History  Previous substance use disorder COPD Chronic systolic HF with EF of 30-35% in May 2025 HLD Past Surgical History:  Procedure Laterality Date   LEFT AND RIGHT HEART CATHETERIZATION WITH CORONARY  ANGIOGRAM N/A 06/06/2014   Procedure: LEFT AND RIGHT HEART CATHETERIZATION WITH CORONARY ANGIOGRAM;  Surgeon: Mardell Shade, MD;  Location: [facility] CATH LAB;  Service: Cardiovascular;  Laterality: N/A;   MULTIPLE EXTRACTIONS WITH ALVEOLOPLASTY Bilateral 07/24/2015   Procedure: MULTIPLE EXTRACTIONS WITH ALVEOLOPLASTY,  BILATERAL TORI ;  Surgeon: Ascencion Lava, DDS;  Location: [facility] OR;  Service: Oral Surgery;  Laterality: Bilateral;   RIGHT/LEFT HEART CATH AND CORONARY ANGIOGRAPHY N/A 04/11/2022   Procedure: RIGHT/LEFT HEART CATH AND CORONARY ANGIOGRAPHY;  Surgeon: Lucendia Rusk, MD;  Location[facility] INVASIVE CV LAB;  Service: Cardiovascular;  Laterality: N/A;   TUBAL LIGATION  1986    Social:  Lives With: Her son Occupation: Does not work  Support: Son she lives with  Level of Function: Independent in all ADLs/IADLs when not in acutely ill PCP: Madelyn Schick Substances: Previous very long history use of crack, stopped three weeks ago   Family History:   No notable family history per patient  Allergies: Allergies as of 04/05/2024   (No Known Allergies)    Review of Systems: A complete ROS was negative except as per HPI.   OBJECTIVE:   Physical Exam: Blood pressure 138/85, pulse (!) 101, resp. rate 19, weight 64.7 kg, SpO2 100%.  Constitutional: Ill appearing female, on bipap HENT: normocephalic atraumatic, mucous membranes moist Eyes: conjunctiva non-erythematous Neck: supple Cardiovascular: regular rate and rhythm,  no m/r/g Pulmonary/Chest: Increased work of breathing, albeit able to speak in full sentences, crackles heard bilaterally in mid-lobes, no wheezes Abdominal: soft, non-tender, non-distended MSK: normal bulk and tone Neurological: alert & oriented x 3, 5/5 strength in bilateral upper and lower extremities, normal gait Skin: warm and dry Psych: normal mood and affect  Labs: CBC    Component Value Date/Time   WBC 10.9 (H) 04/05/2024 1014   RBC 4.11 04/05/2024  1014   HGB 13.6 04/05/2024 1021   HGB 13.3 04/05/2024 1021   HGB 11.2 11/27/2018 1537   HCT 40.0 04/05/2024 1021   HCT 39.0 04/05/2024 1021   HCT 33.3 (L) 11/27/2018 1537   PLT 222 04/05/2024 1014   PLT 318 11/27/2018 1537   MCV 100.7 (H) 04/05/2024 1014   MCV 90 11/27/2018 1537   MCH 31.1 04/05/2024 1014   MCHC 30.9 04/05/2024 1014   RDW 13.4 04/05/2024 1014   RDW 12.4 11/27/2018 1537   LYMPHSABS 2.7 03/05/2024 1408   LYMPHSABS 2.0 11/27/2018 1537   MONOABS 1.0 03/05/2024 1408   EOSABS 0.3 03/05/2024 1408   EOSABS 0.4 11/27/2018 1537   BASOSABS 0.1 03/05/2024 1408   BASOSABS 0.1 11/27/2018 1537     CMP     Component Value Date/Time   NA 141 04/05/2024 1021   NA 141 04/05/2024 1021   NA 143 11/27/2018 1537   K 5.0 04/05/2024 1021   K 5.0 04/05/2024 1021   CL 109 04/05/2024 1021   CO2 28 03/05/2024 1408   GLUCOSE 118 (H) 04/05/2024 1021   BUN 32 (H) 04/05/2024 1021   BUN 18 11/27/2018 1537   CREATININE 0.90 04/05/2024 1021   CREATININE 1.49 (H) 09/16/2015 0810   CALCIUM  9.8 03/05/2024 1408   PROT 6.1 (L) 11/21/2023 1223   PROT 7.3 09/17/2018 1624   ALBUMIN 3.3 (L) 11/21/2023 1223   ALBUMIN 4.6 09/17/2018 1624   AST 40 11/21/2023 1223   ALT 67 (H) 11/21/2023 1223   ALKPHOS 119 11/21/2023 1223   BILITOT 1.3 (H) 11/21/2023 1223   BILITOT 0.3 09/17/2018 1624   GFRNONAA 50 (L) 03/03/2024 0255   GFRNONAA 54 (L) 03/31/2014 1011   GFRAA >60 09/03/2019 1118   GFRAA 62 03/31/2014 1011    Imaging:  Chest X-Ray: Patchy airspace opacities in both lung bases, can represent atelectasis or developing bronchopneumonia  EKG: personally reviewed my interpretation is sinus rate and rhythm, some peaked T waves in II, V5, albeit similar to Prior EKG  ASSESSMENT & PLAN:   Assessment & Plan by Problem: Principal Problem:   Acute hypoxic respiratory failure (HCC)   Stacey Lin is a 58 y.o. person living with a history of previous substance use disorder, COPD/Asthma, HTN,  HFrEF who presented with shortness of breath and admitted for acute hypoxic respiratory failure on hospital day 0  Acute respiratory distress  HFrEF exacerbation, EF 30 to 35% Community-acquired pneumonia Leukocytosis Presented with 3 days of worsening shortness of breath, productive cough, diaphoresis, chest tightness, mild lower extremity swelling.  Patient has not been taking her medication for the past 3 weeks, she lost her pill pack.  Chest x-ray notable for patchy airspace opacities in both lungs that could represent atelectasis or developing bronchopneumonia, mild cardiomegaly.  Labs pertinent for BNP 919.6, WBC 10.9.  D-dimer negative. In the ED, patient was found to be in respiratory distress and tachypneic, she was placed briefly on BiPAP.  VBG showed pH 7.3, pCO2 44, pO2 45, bicarb 25, noting no hypercarbia.  Troponins 28-> 20. Patient has been taken off BiPAP, placed on 5 L nasal cannula saturating above 90%.  On exam, speaking in choppy sentences, fine crackles noted bibasilar lung fields, no LE edema.  In the ED, patient is given 1 dose of IV Lasix  40 mg, IV Zithromax  and Rocephin . - F/u RVP - Magnesium  2.0, potassium 4.1 -Ordered 1 dose of IV Lasix  40 mg -Continue IV Rocephin  2 g daily for 4 doses starting tomorrow -Continue Zithromax  500 mg daily for 2 doses starting tomorrow -Strict I's and O's and daily weights -PTA GDMT consists of:  - Hold Valsartan  40 mg daily, Coreg  3.125 mg twice daily, spironolactone  25 mg daily for now d/t low BP.   - Will restart home meds as BP tolerates  - Continue Farxiga  10 mg daily  History of asthma/COPD History of upper airway cough syndrome Reports productive cough x 3 days and chest tightness, no wheezing noted on exam. Cough likely in the setting of HF exacerbation.  Reports that she was using her inhalers though not helping her symptoms. - Continue Breo Ellipta  1 puff daily - Continue Singulair  10 mg daily - As needed DuoNebs every 4 hours  as needed for wheezing and shortness of breath - As needed guaifenesin -codeine  every 4 hours as needed for cough  History of GERD - Continue medication Protonix  40 mg daily starting tomorrow  Type 2 diabetes, controlled Last A1c 5.8 noted on 02/28/2024. PTA medication metformin  500 mg daily. Glucose 122 on BMP - No SSI needs for now   CODE STATUS Patient expressed that she wants to be DNR/DNI, she does not want any chest compressions or intubations; reports that she does not want to be placed on ventilator.  She reports that I want to go in peace when it is my time.  - DNR/DNI  Diet: Heart Healthy VTE: Enoxaparin  IVF: None,None Code: DNR/DNI  Prior to Admission Living Arrangement: Home, living son Anticipated Discharge Location: Home Barriers to Discharge: Medical management  Dispo: Admit patient to Observation with expected length of stay less than 2 midnights.  Signed: Lanney Pitts, DO Internal Medicine Resident PGY-2  04/05/2024, 3:35 PM

## 2024-04-05 NOTE — ED Triage Notes (Signed)
 Pt arrives via POV for resp distress. Hx of CHF and COPD, been out of meds for 1 month. Pt tripoding upon arrival to ED

## 2024-04-06 DIAGNOSIS — D72829 Elevated white blood cell count, unspecified: Secondary | ICD-10-CM | POA: Diagnosis not present

## 2024-04-06 DIAGNOSIS — J189 Pneumonia, unspecified organism: Secondary | ICD-10-CM

## 2024-04-06 DIAGNOSIS — J440 Chronic obstructive pulmonary disease with acute lower respiratory infection: Secondary | ICD-10-CM

## 2024-04-06 DIAGNOSIS — J441 Chronic obstructive pulmonary disease with (acute) exacerbation: Secondary | ICD-10-CM

## 2024-04-06 DIAGNOSIS — I5023 Acute on chronic systolic (congestive) heart failure: Secondary | ICD-10-CM

## 2024-04-06 DIAGNOSIS — E119 Type 2 diabetes mellitus without complications: Secondary | ICD-10-CM

## 2024-04-06 LAB — GLUCOSE, CAPILLARY
Glucose-Capillary: 141 mg/dL — ABNORMAL HIGH (ref 70–99)
Glucose-Capillary: 188 mg/dL — ABNORMAL HIGH (ref 70–99)
Glucose-Capillary: 93 mg/dL (ref 70–99)

## 2024-04-06 LAB — RESPIRATORY PANEL BY PCR

## 2024-04-06 LAB — MAGNESIUM: Magnesium: 2.1 mg/dL (ref 1.7–2.4)

## 2024-04-06 LAB — BASIC METABOLIC PANEL WITH GFR
Anion gap: 8 (ref 5–15)
BUN: 28 mg/dL — ABNORMAL HIGH (ref 6–20)
CO2: 27 mmol/L (ref 22–32)
Calcium: 8.5 mg/dL — ABNORMAL LOW (ref 8.9–10.3)
Chloride: 101 mmol/L (ref 98–111)
Creatinine, Ser: 1.15 mg/dL — ABNORMAL HIGH (ref 0.44–1.00)
GFR, Estimated: 55 mL/min — ABNORMAL LOW (ref >60)
Glucose, Bld: 180 mg/dL — ABNORMAL HIGH (ref 70–99)
Potassium: 4.1 mmol/L (ref 3.5–5.1)
Sodium: 136 mmol/L (ref 135–145)

## 2024-04-06 LAB — CBC
HCT: 40 % (ref 36.0–46.0)
Hemoglobin: 12.5 g/dL (ref 12.0–15.0)
MCH: 31.1 pg (ref 26.0–34.0)
MCHC: 31.3 g/dL (ref 30.0–36.0)
MCV: 99.5 fL (ref 80.0–100.0)
Platelets: 207 10*3/uL (ref 150–400)
RBC: 4.02 MIL/uL (ref 3.87–5.11)
RDW: 13.3 % (ref 11.5–15.5)
WBC: 9.1 10*3/uL (ref 4.0–10.5)
nRBC: 0 % (ref 0.0–0.2)

## 2024-04-06 MED ORDER — BENZONATATE 100 MG PO CAPS
100.0000 mg | ORAL_CAPSULE | Freq: Two times a day (BID) | ORAL | Status: DC
  Administered 2024-04-06: 100 mg via ORAL
  Filled 2024-04-06: qty 1

## 2024-04-06 MED ORDER — MENTHOL 3 MG MT LOZG: 1.0000 | LOZENGE | OROMUCOSAL | Status: DC | PRN

## 2024-04-06 MED ORDER — INSULIN ASPART 100 UNIT/ML IJ SOLN
0.0000 [IU] | Freq: Three times a day (TID) | INTRAMUSCULAR | Status: DC
  Administered 2024-04-06 – 2024-04-07 (×2): 2 [IU] via SUBCUTANEOUS
  Administered 2024-04-07 – 2024-04-08 (×2): 1 [IU] via SUBCUTANEOUS
  Administered 2024-04-08: 2 [IU] via SUBCUTANEOUS

## 2024-04-06 MED ORDER — FUROSEMIDE 10 MG/ML IJ SOLN
40.0000 mg | Freq: Two times a day (BID) | INTRAMUSCULAR | Status: DC
  Administered 2024-04-06 – 2024-04-07 (×3): 40 mg via INTRAVENOUS
  Filled 2024-04-06 (×3): qty 4

## 2024-04-06 MED ORDER — IPRATROPIUM-ALBUTEROL 0.5-2.5 (3) MG/3ML IN SOLN: 3.0000 mL | RESPIRATORY_TRACT | Status: DC | PRN

## 2024-04-06 MED ORDER — CARVEDILOL 3.125 MG PO TABS
3.1250 mg | ORAL_TABLET | Freq: Two times a day (BID) | ORAL | Status: DC
  Administered 2024-04-06 – 2024-04-10 (×7): 3.125 mg via ORAL
  Filled 2024-04-06 (×8): qty 1

## 2024-04-06 MED ORDER — BENZONATATE 100 MG PO CAPS
200.0000 mg | ORAL_CAPSULE | Freq: Two times a day (BID) | ORAL | Status: DC
  Administered 2024-04-06 – 2024-04-10 (×8): 200 mg via ORAL
  Filled 2024-04-06 (×8): qty 2

## 2024-04-06 MED ORDER — IPRATROPIUM-ALBUTEROL 0.5-2.5 (3) MG/3ML IN SOLN
3.0000 mL | RESPIRATORY_TRACT | Status: DC
  Administered 2024-04-06 (×2): 3 mL via RESPIRATORY_TRACT
  Filled 2024-04-06 (×2): qty 3

## 2024-04-06 NOTE — Evaluation (Signed)
 Physical Therapy Evaluation Patient Details Name: Stacey Lin MRN: [id number] DOB: 01-29-1966 Today's Date: 04/06/2024  History of Present Illness  Patient is 58 yo female admitted on 04/05/24 for acute respiratory distress. Found to have acute exacerbation of chronic HFrEF as well as a community-acquired pneumonia. PMH significant for HFrEF LV, COPD, HLD, HTN, Gout, Depression, GERD, Anxiety, Diabetes, previous substance use disorder.  Clinical Impression  PTA, patient recently moved in with her son and his wife. Ambulating short household distances with SPC and furniture walking. Reports experiencing several falls and inability to ambulate to/from bathroom at times. Patient presents today with generalized weakness B LE, poor activity tolerance, decreased standing balance, and poor gait tolerance/ability. Patient completed bed mobility with CGA and sit to stand transfers with minA. Ambulated 5 feet x2 with 2WW and minA demonstrating significantly reduced step length and gait speed with quick LE fatigue. One LOB during ambulation requiring modA to recover. Patient reported motivation to improve her quality of life and return to her home where she lives with her brother. Patient will continue to benefit from skilled acute PT services to address the above impairments. Recommend post-acute rehab < 3 hours/day to improve patient's strength and mobility in order to safely return home.         If plan is discharge home, recommend the following: A lot of help with bathing/dressing/bathroom;A lot of help with walking and/or transfers;Assistance with cooking/housework;Help with stairs or ramp for entrance;Assist for transportation   Can travel by private vehicle   No    Equipment Recommendations Rolling walker (2 wheels);Other (comment) (Will continue to assess)  Recommendations for Other Services       Functional Status Assessment Patient has had a recent decline in their functional status and  demonstrates the ability to make significant improvements in function in a reasonable and predictable amount of time.     Precautions / Restrictions Precautions Precautions: Fall Restrictions Weight Bearing Restrictions Per Provider Order: No      Mobility  Bed Mobility Overal bed mobility: Needs Assistance Bed Mobility: Supine to Sit, Sit to Supine     Supine to sit: Contact guard Sit to supine: Contact guard assist   General bed mobility comments: CGA for bed mobility with cues for safe sequencing, HOB elevated    Transfers Overall transfer level: Needs assistance   Transfers: Sit to/from Stand Sit to Stand: Min assist           General transfer comment: From EOB x2, BSC x1, and chair x1; minA for safe sequencing to 2WW    Ambulation/Gait Ambulation/Gait assistance: Min assist Gait Distance (Feet): 10 Feet (5 feet x2 with seated rest break between due to LE fatigue) Assistive device: Rolling walker (2 wheels) Gait Pattern/deviations: Decreased stride length, Shuffle, Narrow base of support       General Gait Details: LE fatigue observed with tremors in standing  Stairs            Wheelchair Mobility     Tilt Bed    Modified Rankin (Stroke Patients Only)       Balance Overall balance assessment: Needs assistance, History of Falls Sitting-balance support: No upper extremity supported, Feet supported Sitting balance-Leahy Scale: Fair Sitting balance - Comments: seated balance at EOB with SBA for safety   Standing balance support: Bilateral upper extremity supported, Reliant on assistive device for balance Standing balance-Leahy Scale: Poor Standing balance comment: Poor stability in standing position with LE fatigue and poor postural awareness  Pertinent Vitals/Pain Pain Assessment Pain Assessment: No/denies pain (Patient reported feeling unwell)    Home Living Family/patient expects to be discharged  to:: Private residence Living Arrangements: Children Available Help at Discharge: Available PRN/intermittently Type of Home: House Home Access: Stairs to enter Entrance Stairs-Rails: None Entrance Stairs-Number of Steps: 2 Alternate Level Stairs-Number of Steps: split level with 7-8 steps to main level with bed/bath/kitchen Home Layout: Two level Home Equipment: Cane - single point      Prior Function Prior Level of Function : Needs assist;History of Falls (last six months)             Mobility Comments: Patient reports she recently moved in with her son and his wife about 3 weeks ago secondary to her being unable to take care of herself. Son is home most of the day but his wife works as a Runner, broadcasting/film/video. She states since onset of symptoms she has been struggling to mobilize at home and limited to one level, unable to complete stairs. She uses SPC and holds onto wall but often is unable to ambulate to/from bathroom. Son's wife will assist patient with tub/shower transfers and she bathes herself. Significant history of falls with R shoulder soreness.       Extremity/Trunk Assessment        Lower Extremity Assessment Lower Extremity Assessment: Generalized weakness    Cervical / Trunk Assessment Cervical / Trunk Assessment: Normal  Communication   Communication Communication: No apparent difficulties    Cognition Arousal: Alert Behavior During Therapy: Lability (Patient intermittently emotional throughout session on current mobility state.)   PT - Cognitive impairments: No apparent impairments                         Following commands: Intact       Cueing Cueing Techniques: Verbal cues, Tactile cues     General Comments      Exercises     Assessment/Plan    PT Assessment Patient needs continued PT services  PT Problem List Decreased strength;Decreased balance;Decreased knowledge of precautions;Decreased mobility;Decreased knowledge of use of DME;Decreased  activity tolerance;Decreased safety awareness       PT Treatment Interventions DME instruction;Gait training;Stair training;Functional mobility training;Therapeutic activities;Therapeutic exercise;Balance training;Neuromuscular re-education;Patient/family education;Wheelchair mobility training    PT Goals (Current goals can be found in the Care Plan section)  Acute Rehab PT Goals Patient Stated Goal: Patient's goal is to take back her life by improving mobility and quality of life PT Goal Formulation: With patient Time For Goal Achievement: 04/20/24 Potential to Achieve Goals: Good    Frequency Min 2X/week     Co-evaluation               AM-PAC PT 6 Clicks Mobility  Outcome Measure Help needed turning from your back to your side while in a flat bed without using bedrails?: None Help needed moving from lying on your back to sitting on the side of a flat bed without using bedrails?: A Little Help needed moving to and from a bed to a chair (including a wheelchair)?: A Little Help needed standing up from a chair using your arms (e.g., wheelchair or bedside chair)?: A Little Help needed to walk in hospital room?: A Lot Help needed climbing 3-5 steps with a railing? : Total 6 Click Score: 16    End of Session Equipment Utilized During Treatment: Gait belt Activity Tolerance: Patient tolerated treatment well Patient left: in bed;with bed alarm set;with call bell/phone within  reach   PT Visit Diagnosis: Unsteadiness on feet (R26.81);Other abnormalities of gait and mobility (R26.89);Muscle weakness (generalized) (M62.81);History of falling (Z91.81)    Time: 1433-1510 PT Time Calculation (min) (ACUTE ONLY): 37 min   Charges:   PT Evaluation $PT Eval Low Complexity: 1 Low PT Treatments $Gait Training: 8-22 mins $Therapeutic Activity: 8-22 mins PT General Charges $$ ACUTE PT VISIT: 1 Visit         Mariano Shiver, PT, DPT [facility] Acute Rehabilitation Office: [phone]   Stacey Lin 04/06/2024, 4:41 PM

## 2024-04-06 NOTE — Plan of Care (Signed)
   Problem: Education: Goal: Knowledge of General Education information will improve Description Including pain rating scale, medication(s)/side effects and non-pharmacologic comfort measures Outcome: Progressing

## 2024-04-06 NOTE — Progress Notes (Signed)
 HD#1 Subjective:   Summary: Stacey Lin is a 58 y.o. female with PMH of previous substance use disorder, COPD/Asthma, HTN, HFrEF who presented with shortness of breath and admitted for acute respiratory distress secondary to heart failure exacerbation and community-acquired pneumonia.  Overnight Events: None  Interim history: Patient is evaluated bedside, laying in bed, coughing.  Patient reports that she still has the chest tightness that is worse with coughing.  Denies any abdominal pain, nausea or vomiting.  Reports coughing is still worse today.  Not feeling well this morning.  Objective:  Vital signs in last 24 hours: Vitals:   04/06/24 0012 04/06/24 0422 04/06/24 0749 04/06/24 0849  BP: 123/84 132/81 138/72   Pulse:  92 81   Resp: 13 20 20    Temp: (!) 97.5 F (36.4 C) 98.3 F (36.8 C) 98.9 F (37.2 C)   TempSrc: Oral Oral Oral   SpO2: 97% 95% 100% 93%  Weight:  67.8 kg    Height:       Supplemental O2: Room Air SpO2: 93 % O2 Flow Rate (L/min): 2 L/min FiO2 (%): 60 %   Physical Exam:   Constitutional: Appears ill, laying in bed Cardiovascular: regular rate  Pulmonary/Chest: Diffuse wheezing Abdominal: soft, non-tender, non-distended, bowel sounds present MSK: normal bulk and tone, no lower extremity edema Neurological: Awake, alert, answering questions appropriately, no focal deficits Skin: warm and dry Psych: Pleasant  Filed Weights   04/05/24 1329 04/05/24 1500 04/06/24 0422  Weight: 64.7 kg 67.8 kg 67.8 kg     Intake/Output Summary (Last 24 hours) at 04/06/2024 1007 Last data filed at 04/06/2024 0600 Gross per 24 hour  Intake 817.44 ml  Output 1200 ml  Net -382.56 ml   Net IO Since Admission: -382.56 mL [04/06/24 1007]  Pertinent Labs:    Latest Ref Rng & Units 04/06/2024    2:45 AM 04/05/2024   10:21 AM 04/05/2024   10:14 AM  CBC  WBC 4.0 - 10.5 K/uL 9.1   10.9   Hemoglobin 12.0 - 15.0 g/dL 40.9  81.1    91.4  78.2   Hematocrit 36.0 -  46.0 % 40.0  40.0    39.0  41.4   Platelets 150 - 400 K/uL 207   222        Latest Ref Rng & Units 04/06/2024    2:45 AM 04/05/2024    2:32 PM 04/05/2024   10:21 AM  CMP  Glucose 70 - 99 mg/dL 956  213  086   BUN 6 - 20 mg/dL 28  21  32   Creatinine 0.44 - 1.00 mg/dL 5.78  4.69  6.29   Sodium 135 - 145 mmol/L 136  143  141    141   Potassium 3.5 - 5.1 mmol/L 4.1  4.1  5.0    5.0   Chloride 98 - 111 mmol/L 101  105  109   CO2 22 - 32 mmol/L 27  25    Calcium  8.9 - 10.3 mg/dL 8.5  8.9      Imaging: DG Chest Portable 1 View Result Date: 04/05/2024 CLINICAL DATA:  resp distress EXAM: PORTABLE CHEST - 1 VIEW COMPARISON:  Mar 05, 2024 FINDINGS: Patchy airspace opacities in both lung bases. No pleural effusion or pneumothorax. Mild cardiomegaly. Tortuous aorta with aortic atherosclerosis. No acute fracture or destructive lesion. Multilevel thoracic osteophytosis. IMPRESSION: Patchy airspace opacities in both lung bases, which may represent atelectasis or developing bronchopneumonia, in the correct clinical context. Electronically  Signed   By: Rance Burrows M.D.   On: 04/05/2024 11:11    Assessment/Plan:   Principal Problem:   Acute respiratory distress   Patient Summary:  HFrEF exacerbation, EF 30 to 35% Community-acquired pneumonia Leukocytosis Presented with 3 days of worsening shortness of breath, productive cough, diaphoresis, chest tightness, mild lower extremity swelling.  Patient has not been taking her medication for the past 3 weeks, she lost her pill pack.  Chest x-ray notable for patchy airspace opacities in both lungs that could represent atelectasis or developing bronchopneumonia, mild cardiomegaly.  Labs pertinent for BNP 919.6, WBC 10.9.  D-dimer negative. In the ED, patient was found to be in respiratory distress and tachypneic, she was placed briefly on BiPA, no documented hypoxia.  VBG showed pH 7.3, pCO2 44, pO2 45, bicarb 25, noting no hypercarbia.  Troponins 28->  20. In the ED, patient is given 1 dose of IV Lasix  40 mg, IV Zithromax  and Rocephin . RVP negative.   On exam today, she continues to have coughing fit and chest tightness. Lung exam notable for diffuse wheezing.   For CAP: - F/u RVP extended  -Continue IV Rocephin  2 g daily for 1/4 doses  -Continue Zithromax  500 mg daily for 1/2 doses   For HF: - Magnesium  2.1, potassium 4.1  - Serum creatinine 1.15 (0.84) - UOP 1.2L - Continue IV Lasix  40mg  BID today - Strict I's and O's and daily weights and RFPs  - PTA GDMT consists of:             - Hold Valsartan  40 mg daily, spironolactone  25 mg daily for now d/t low BP.              - Start Coreg  3.125 mg twice daily today              - Continue Farxiga  10 mg daily   COPD Exacerbation, mild  History of upper airway cough syndrome Reports productive cough x 3 days and chest tightness. On exam, diffuse wheezing is noted. - Continue Breo Ellipta  1 puff daily - Continue Singulair  10 mg daily - Ipratropium-DuoNebs every 4 hours for wheezing and shortness of breath - As needed guaifenesin -codeine  every 4 hours as needed for cough -Tessalon  Perles 200 mg twice daily for cough - Lidocaine  patches for chest  - PRN lozenges    History of GERD - Continue medication Protonix  40 mg daily    Type 2 diabetes, controlled Last A1c 5.8 noted on 02/28/2024. PTA medication metformin  500 mg daily. Glucose 180 on BMP - SSI sensitive    CODE STATUS Patient expressed that she wants to be DNR/DNI, she does not want any chest compressions or intubations; reports that she does not want to be placed on ventilator.  She reports that I want to go in peace when it is my time.  - DNR/DNI  ================================ Resolved conditions: -Acute respiratory distress  Diet: Heart Healthy IVF: None VTE: Enoxaparin  Wounds: Assessed by RN  Code: DNR/DNI PT/OT recs: Pending TOC recs: Pending   Dispo: Anticipated discharge to Home in 1 days pending  medical management.   Signature: Lanney Pitts, D.O.  Internal Medicine Resident, PGY-1 [facility]  10:07 AM, 04/06/2024   Please contact the on call pager after 5 pm and on weekends at [phone].

## 2024-04-07 DIAGNOSIS — E785 Hyperlipidemia, unspecified: Secondary | ICD-10-CM

## 2024-04-07 DIAGNOSIS — J189 Pneumonia, unspecified organism: Secondary | ICD-10-CM | POA: Diagnosis not present

## 2024-04-07 DIAGNOSIS — K219 Gastro-esophageal reflux disease without esophagitis: Secondary | ICD-10-CM

## 2024-04-07 DIAGNOSIS — I5023 Acute on chronic systolic (congestive) heart failure: Secondary | ICD-10-CM | POA: Diagnosis not present

## 2024-04-07 DIAGNOSIS — J440 Chronic obstructive pulmonary disease with acute lower respiratory infection: Secondary | ICD-10-CM | POA: Diagnosis not present

## 2024-04-07 DIAGNOSIS — J441 Chronic obstructive pulmonary disease with (acute) exacerbation: Secondary | ICD-10-CM | POA: Diagnosis not present

## 2024-04-07 LAB — CBC
HCT: 45.2 % (ref 36.0–46.0)
Hemoglobin: 14.3 g/dL (ref 12.0–15.0)
MCH: 30.6 pg (ref 26.0–34.0)
MCHC: 31.6 g/dL (ref 30.0–36.0)
MCV: 96.6 fL (ref 80.0–100.0)
Platelets: 273 10*3/uL (ref 150–400)
RBC: 4.68 MIL/uL (ref 3.87–5.11)
RDW: 13.3 % (ref 11.5–15.5)
WBC: 9 10*3/uL (ref 4.0–10.5)
nRBC: 0 % (ref 0.0–0.2)

## 2024-04-07 LAB — RENAL FUNCTION PANEL
Albumin: 3.5 g/dL (ref 3.5–5.0)
Anion gap: 14 (ref 5–15)
BUN: 31 mg/dL — ABNORMAL HIGH (ref 6–20)
CO2: 27 mmol/L (ref 22–32)
Calcium: 9.2 mg/dL (ref 8.9–10.3)
Chloride: 95 mmol/L — ABNORMAL LOW (ref 98–111)
Creatinine, Ser: 1.04 mg/dL — ABNORMAL HIGH (ref 0.44–1.00)
GFR, Estimated: >60 mL/min (ref >60)
Glucose, Bld: 160 mg/dL — ABNORMAL HIGH (ref 70–99)
Phosphorus: 4.9 mg/dL — ABNORMAL HIGH (ref 2.5–4.6)
Potassium: 3.8 mmol/L (ref 3.5–5.1)
Sodium: 136 mmol/L (ref 135–145)

## 2024-04-07 LAB — GLUCOSE, CAPILLARY
Glucose-Capillary: 184 mg/dL — ABNORMAL HIGH (ref 70–99)
Glucose-Capillary: 135 mg/dL — ABNORMAL HIGH (ref 70–99)
Glucose-Capillary: 102 mg/dL — ABNORMAL HIGH (ref 70–99)
Glucose-Capillary: 199 mg/dL — ABNORMAL HIGH (ref 70–99)

## 2024-04-07 LAB — MAGNESIUM: Magnesium: 2.3 mg/dL (ref 1.7–2.4)

## 2024-04-07 MED ORDER — MELATONIN 5 MG PO TABS
5.0000 mg | ORAL_TABLET | Freq: Every day | ORAL | Status: DC
  Administered 2024-04-07 – 2024-04-09 (×3): 5 mg via ORAL
  Filled 2024-04-07 (×3): qty 1

## 2024-04-07 MED ORDER — ONDANSETRON 4 MG PO TBDP
8.0000 mg | ORAL_TABLET | Freq: Two times a day (BID) | ORAL | Status: DC
  Administered 2024-04-07 – 2024-04-10 (×7): 8 mg via ORAL
  Filled 2024-04-07 (×7): qty 2

## 2024-04-07 MED ORDER — PROCHLORPERAZINE EDISYLATE 10 MG/2ML IJ SOLN
5.0000 mg | Freq: Once | INTRAMUSCULAR | Status: AC
  Administered 2024-04-07: 5 mg via INTRAVENOUS
  Filled 2024-04-07: qty 2

## 2024-04-07 MED ORDER — DIPHENHYDRAMINE HCL 25 MG PO CAPS
25.0000 mg | ORAL_CAPSULE | Freq: Once | ORAL | Status: AC
  Administered 2024-04-07: 25 mg via ORAL
  Filled 2024-04-07: qty 1

## 2024-04-07 MED ORDER — SENNOSIDES-DOCUSATE SODIUM 8.6-50 MG PO TABS
1.0000 | ORAL_TABLET | Freq: Every day | ORAL | Status: DC
  Administered 2024-04-07 – 2024-04-08 (×2): 1 via ORAL
  Filled 2024-04-07 (×2): qty 1

## 2024-04-07 NOTE — Plan of Care (Signed)
 Pt A/Ox3, VSS. On RA. 1 PIV in place; SL. IV ABX discontinued today. Pt being diuresed. Adequate I/O. Pt c/o migraine; compazine added to help along with tylenol  PRN. Pt ambulating independently. No tele orders. Call light within reach. Pt downgraded to MS.   Problem: Education: Goal: Knowledge of General Education information will improve Description: Including pain rating scale, medication(s)/side effects and non-pharmacologic comfort measures Outcome: Progressing   Problem: Health Behavior/Discharge Planning: Goal: Ability to manage health-related needs will improve Outcome: Progressing   Problem: Clinical Measurements: Goal: Ability to maintain clinical measurements within normal limits will improve Outcome: Progressing Goal: Will remain free from infection Outcome: Progressing Goal: Diagnostic test results will improve Outcome: Progressing Goal: Respiratory complications will improve Outcome: Progressing Goal: Cardiovascular complication will be avoided Outcome: Progressing   Problem: Activity: Goal: Risk for activity intolerance will decrease Outcome: Progressing   Problem: Nutrition: Goal: Adequate nutrition will be maintained Outcome: Progressing   Problem: Coping: Goal: Level of anxiety will decrease Outcome: Progressing   Problem: Elimination: Goal: Will not experience complications related to bowel motility Outcome: Progressing Goal: Will not experience complications related to urinary retention Outcome: Progressing   Problem: Pain Managment: Goal: General experience of comfort will improve and/or be controlled Outcome: Progressing   Problem: Safety: Goal: Ability to remain free from injury will improve Outcome: Progressing   Problem: Skin Integrity: Goal: Risk for impaired skin integrity will decrease Outcome: Progressing   Problem: Activity: Goal: Ability to tolerate increased activity will improve Outcome: Progressing   Problem: Clinical  Measurements: Goal: Ability to maintain a body temperature in the normal range will improve Outcome: Progressing   Problem: Respiratory: Goal: Ability to maintain adequate ventilation will improve Outcome: Progressing Goal: Ability to maintain a clear airway will improve Outcome: Progressing   Problem: Education: Goal: Ability to describe self-care measures that may prevent or decrease complications (Diabetes Survival Skills Education) will improve Outcome: Progressing Goal: Individualized Educational Video(s) Outcome: Progressing   Problem: Coping: Goal: Ability to adjust to condition or change in health will improve Outcome: Progressing   Problem: Fluid Volume: Goal: Ability to maintain a balanced intake and output will improve Outcome: Progressing   Problem: Health Behavior/Discharge Planning: Goal: Ability to identify and utilize available resources and services will improve Outcome: Progressing Goal: Ability to manage health-related needs will improve Outcome: Progressing   Problem: Metabolic: Goal: Ability to maintain appropriate glucose levels will improve Outcome: Progressing   Problem: Nutritional: Goal: Maintenance of adequate nutrition will improve Outcome: Progressing Goal: Progress toward achieving an optimal weight will improve Outcome: Progressing   Problem: Skin Integrity: Goal: Risk for impaired skin integrity will decrease Outcome: Progressing   Problem: Tissue Perfusion: Goal: Adequacy of tissue perfusion will improve Outcome: Progressing

## 2024-04-07 NOTE — Progress Notes (Signed)
 First attempt to call report at 1325. Will attempt to call again in 10 minutes.

## 2024-04-07 NOTE — NC FL2 (Signed)
 [place]  MEDICAID FL2 LEVEL OF CARE FORM     IDENTIFICATION  Patient Name: Stacey Lin Birthdate: 07-09-1966 Sex: female Admission Date (Current Location): 04/05/2024  [facility] and [place] Number:  Producer, television/film/video and Address:  [facility] [place], [address], [place], [place] [address]      Provider Number: [id number]  Attending Physician Name and Address:  Priscella Brooms, DO  Relative Name and Phone Number:  [place], Sc[place]  son [phone]    Current Level of Care: Hospital Recommended Level of Care: Skilled Nursing Facility Prior Approval Number:    Date Approved/Denied:   PASRR Number:    Discharge Plan: SNF    Current Diagnoses: Patient Active Problem List   Diagnosis Date Noted   Acute respiratory distress 04/05/2024   Lumbar radiculopathy 03/13/2024   CHF (congestive heart failure) (HCC) 02/29/2024   COPD with acute exacerbation (HCC) 02/28/2024   Respiratory failure with hypoxia (HCC) 11/21/2023   COPD exacerbation (HCC) 07/25/2023   Nonrheumatic aortic valve insufficiency    Elevated LFTs 04/08/2022   Acute metabolic encephalopathy 04/08/2022   Depressed mood 04/08/2022   Headache 04/07/2022   Obesity (BMI 30-39.9) 04/07/2022   Acute exacerbation of CHF (congestive heart failure) (HCC) 04/06/2022   Polysubstance abuse (HCC) 04/06/2022   Elevated troponin 04/06/2022   Controlled type 2 diabetes mellitus with hyperglycemia (HCC) 04/06/2022   Transaminitis 04/06/2022   Hypoalbuminemia 04/06/2022   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/11/2021   Upper airway cough syndrome 12/20/2018   Morbid obesity due to excess calories (HCC) 12/04/2018   Cigarette smoker 11/21/2018   Asthmatic bronchitis, moderate persistent, uncomplicated 11/20/2018   DOE (dyspnea on exertion) 04/14/2016   Streptococcus pneumoniae pneumonia (HCC) 04/07/2016   Acute respiratory failure with hypoxia (HCC) 04/03/2016   Elevated lactic acid level  04/03/2016   Leukocytosis 04/03/2016   New onset type 2 diabetes mellitus (HCC) 04/03/2016   Abnormal urinalysis 04/03/2016   Trichomonas infection 04/03/2016   Non compliance w medication regimen 01/12/2016   COPD without exacerbation (HCC) 12/04/2014   AKI (acute kidney injury) (HCC) 03/14/2014   Chronic systolic HF (heart failure) (HCC) 03/14/2014   Musculoskeletal chest pain 03/14/2014   Dyspnea 12/03/2013   Periodic health assessment, general screening, adult 10/03/2013   Mitral valve regurgitation 08/22/2013   FIBROIDS, UTERUS 05/11/2010   ANEMIA, SECONDARY TO BLOOD LOSS 05/11/2010   Asthmatic bronchitis 05/11/2010   TOBACCO USER 07/07/2009   Essential hypertension, benign 06/19/2009   MENORRHAGIA 06/19/2009   COCAINE  ABUSE, HX OF 06/19/2009    Orientation RESPIRATION BLADDER Height & Weight     Self, Time, Situation, Place  Normal Continent Weight: 146 lb 2.6 oz (66.3 kg) Height:  5' 1 (154.9 cm)  BEHAVIORAL SYMPTOMS/MOOD NEUROLOGICAL BOWEL NUTRITION STATUS      Continent Diet  AMBULATORY STATUS COMMUNICATION OF NEEDS Skin   Limited Assist Verbally Normal                       Personal Care Assistance Level of Assistance  Dressing     Dressing Assistance: Limited assistance     Functional Limitations Info  Sight, Hearing Sight Info: Adequate Hearing Info: Adequate      SPECIAL CARE FACTORS FREQUENCY  PT (By licensed PT), OT (By licensed OT)     PT Frequency: 5x wk OT Frequency: 3x wk            Contractures Contractures Info: Not present    Additional Factors  Info  Code Status Code Status Info: DNR             Current Medications (04/07/2024):  This is the current hospital active medication list Current Facility-Administered Medications  Medication Dose Route Frequency Provider Last Rate Last Admin   acetaminophen  (TYLENOL ) tablet 1,000 mg  1,000 mg Oral Q6H PRN Alexander-Savino, [place], MD   1,000 mg at 04/07/24 0815    atorvastatin  (LIPITOR) tablet 40 mg  40 mg Oral Daily Tawkaliyar, Roya, DO   40 mg at 04/07/24 0819   benzonatate  (TESSALON ) capsule 200 mg  200 mg Oral BID Tawkaliyar, Roya, DO   200 mg at 04/07/24 0819   carvedilol  (COREG ) tablet 3.125 mg  3.125 mg Oral BID WC Tawkaliyar, Roya, DO   3.125 mg at 04/07/24 0650   dapagliflozin  propanediol (FARXIGA ) tablet 10 mg  10 mg Oral Daily Tawkaliyar, Roya, DO   10 mg at 04/07/24 0819   enoxaparin  (LOVENOX ) injection 40 mg  40 mg Subcutaneous Q24H Tawkaliyar, Roya, DO   40 mg at 04/06/24 2217   fluticasone  furoate-vilanterol (BREO ELLIPTA ) 100-25 MCG/ACT 1 puff  1 puff Inhalation Daily Tawkaliyar, Roya, DO   1 puff at 04/07/24 0719   furosemide  (LASIX ) injection 40 mg  40 mg Intravenous BID Tawkaliyar, Roya, DO   40 mg at 04/07/24 0816   guaiFENesin -codeine  100-10 MG/5ML solution 10 mL  10 mL Oral Q4H PRN Tawkaliyar, Roya, DO   10 mL at 04/07/24 0200   insulin  aspart (novoLOG ) injection 0-9 Units  0-9 Units Subcutaneous TID WC Tawkaliyar, Roya, DO   1 Units at 04/07/24 1159   ipratropium-albuterol  (DUONEB) 0.5-2.5 (3) MG/3ML nebulizer solution 3 mL  3 mL Nebulization Q4H PRN Tod Forward C, DO       lidocaine  (LIDODERM ) 5 % 2 patch  2 patch Transdermal Q24H Alexander-Savino, [place], MD   2 patch at 04/07/24 0149   menthol-cetylpyridinium (CEPACOL) lozenge 3 mg  1 lozenge Oral PRN Nooruddin, Saad, MD       montelukast  (SINGULAIR ) tablet 10 mg  10 mg Oral QHS Tawkaliyar, Roya, DO   10 mg at 04/06/24 2220   ondansetron  (ZOFRAN -ODT) disintegrating tablet 8 mg  8 mg Oral Q12H Juberg, Christopher, DO   8 mg at 04/07/24 1914   pantoprazole  (PROTONIX ) EC tablet 40 mg  40 mg Oral Daily Tawkaliyar, Roya, DO   40 mg at 04/07/24 0819   polyethylene glycol (MIRALAX  / GLYCOLAX ) packet 17 g  17 g Oral Daily PRN Tawkaliyar, Roya, DO       senna-docusate (Senokot-S) tablet 1 tablet  1 tablet Oral Daily Juberg, Christopher, DO   1 tablet at 04/07/24 7829     Discharge  Medications: Please see discharge summary for a list of discharge medications.  Relevant Imaging Results:  Relevant Lab Results:   Additional Information SSN: [id number]  Hilda Lovings, LCSW

## 2024-04-07 NOTE — TOC Initial Note (Signed)
 Transition of Care [facility]) - Initial/Assessment Note    Patient Details  Name: Stacey Lin MRN: [id number] Date of Birth: [birth year]  Transition of Care [facility]) CM/SW Contact:    Hilda Lovings, LCSW Phone Number: 04/07/2024, 12:35 PM  Clinical Narrative:                 CSW followed-up disposition recommendations (SNF placement).  CSW completed initial TOC work-up/assessment as noted by the following below.   CSW spoke with the patient to review SNF referral process per clinical recommendations and assessed the pt's SNF preference.   The patient expressed no SNF preference.   The CSW attempted to contact the patient's natural support (Keilman,Brandon son [phone]) to update them to the patient's disposition. CSW was unable to make contact with the natural support. CSW   CSW referral efforts to support the patient's disposition FL2:  There was an issue obtaining the number from off the [facility] site. Please follow-up to obtain PASRR. SNF referrals:    TOC Disposition follow-up needs  Please provide the patient or natural support with bed-offer updates.  Please continue with SNF placement efforts. Please update the clinical team to SNF placement efforts:  No other needs identified by this Clinical research associate currently. Patient needs and current disposition to be followed by    Expected Discharge Plan: Skilled Nursing Facility Barriers to Discharge: Continued Medical Work up   Patient Goals and CMS Choice Patient states their goals for this hospitalization and ongoing recovery are:: To get better and get back home   Choice offered to / list presented to : Adult Children [place] ownership interest in [facility].provided to:: Adult Children    Expected Discharge Plan and Services       Living arrangements for the past 2 months: Skilled Nursing Facility                                      Prior Living Arrangements/Services Living arrangements for the past  2 months: Skilled Nursing Facility Lives with:: Self, Other (Comment) Patient language and need for interpreter reviewed:: Yes        Need for Family Participation in Patient Care: Yes (Comment) Care giver support system in place?: Yes (comment)   Criminal Activity/Legal Involvement Pertinent to Current Situation/Hospitalization: No - Comment as needed  Activities of Daily Living   ADL Screening (condition at time of admission) Independently performs ADLs?: Yes (appropriate for developmental age) Is the patient deaf or have difficulty hearing?: No Does the patient have difficulty seeing, even when wearing glasses/contacts?: No Does the patient have difficulty concentrating, remembering, or making decisions?: No  Permission Sought/Granted Permission sought to share information with : Case Manager             Permission granted to share info w Contact Information: Zhavia, Cunanan  son [phone]  Emotional Assessment       Orientation: : Oriented to Situation, Oriented to  Time, Oriented to Place, Oriented to Self Alcohol / Substance Use: Not Applicable Psych Involvement: No (comment)  Admission diagnosis:  Pneumonia of both lungs due to infectious organism, unspecified part of lung [J18.9] Congestive heart failure, unspecified HF chronicity, unspecified heart failure type (HCC) [I50.9] Acute hypoxic respiratory failure (HCC) [J96.01] Patient Active Problem List   Diagnosis Date Noted   Acute respiratory distress 04/05/2024   Lumbar radiculopathy 03/13/2024   CHF (congestive heart failure) (HCC) 02/29/2024  COPD with acute exacerbation (HCC) 02/28/2024   Respiratory failure with hypoxia (HCC) 11/21/2023   COPD exacerbation (HCC) 07/25/2023   Nonrheumatic aortic valve insufficiency    Elevated LFTs 04/08/2022   Acute metabolic encephalopathy 04/08/2022   Depressed mood 04/08/2022   Headache 04/07/2022   Obesity (BMI 30-39.9) 04/07/2022   Acute exacerbation of CHF  (congestive heart failure) (HCC) 04/06/2022   Polysubstance abuse (HCC) 04/06/2022   Elevated troponin 04/06/2022   Controlled type 2 diabetes mellitus with hyperglycemia (HCC) 04/06/2022   Transaminitis 04/06/2022   Hypoalbuminemia 04/06/2022   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/11/2021   Upper airway cough syndrome 12/20/2018   Morbid obesity due to excess calories (HCC) 12/04/2018   Cigarette smoker 11/21/2018   Asthmatic bronchitis, moderate persistent, uncomplicated 11/20/2018   DOE (dyspnea on exertion) 04/14/2016   Streptococcus pneumoniae pneumonia (HCC) 04/07/2016   Acute respiratory failure with hypoxia (HCC) 04/03/2016   Elevated lactic acid level 04/03/2016   Leukocytosis 04/03/2016   New onset type 2 diabetes mellitus (HCC) 04/03/2016   Abnormal urinalysis 04/03/2016   Trichomonas infection 04/03/2016   Non compliance w medication regimen 01/12/2016   COPD without exacerbation (HCC) 12/04/2014   AKI (acute kidney injury) (HCC) 03/14/2014   Chronic systolic HF (heart failure) (HCC) 03/14/2014   Musculoskeletal chest pain 03/14/2014   Dyspnea 12/03/2013   Periodic health assessment, general screening, adult 10/03/2013   Mitral valve regurgitation 08/22/2013   FIBROIDS, UTERUS 05/11/2010   ANEMIA, SECONDARY TO BLOOD LOSS 05/11/2010   Asthmatic bronchitis 05/11/2010   TOBACCO USER 07/07/2009   Essential hypertension, benign 06/19/2009   MENORRHAGIA 06/19/2009   COCAINE  ABUSE, HX OF 06/19/2009   PCP:  Marius Siemens, NP Pharmacy:   [facility] [phone] Jonette Nestle, [place] - [address] AT [address] [place] [address] Phone: [phone] Fax: [phone]  [facility] [place]  - Patsi Boots - [address] [place] [address] Phone: [phone] Fax: [phone]  [facility] [address] [place] [address] Phone: [phone] Fax:  [phone]  [facility] [place], [place] - [address] [place] [address] Phone: [phone] Fax: [phone]  [facility] [address] [place] - [address] [place] [address] Phone: [phone] Fax: [phone]     Social Drivers of Health (SDOH) Social History: SDOH Screenings   Food Insecurity: No Food Insecurity (04/05/2024)  Recent Concern: Food Insecurity - Food Insecurity Present (02/28/2024)  Housing: Low Risk  (04/05/2024)  Recent Concern: Housing - High Risk (02/28/2024)  Transportation Needs: No Transportation Needs (04/05/2024)  Recent Concern: Transportation Needs - Unmet Transportation Needs (02/28/2024)  Utilities: Not At Risk (04/05/2024)  Recent Concern: Utilities - At Risk (02/28/2024)  Depression (PHQ2-9): High Risk (10/19/2023)  Financial Resource Strain: High Risk (12/21/2023)  Physical Activity: Unknown (10/18/2023)  Recent Concern: Physical Activity - Inactive (08/02/2023)  Social Connections: Moderately Integrated (04/05/2024)  Stress: Stress Concern Present (10/18/2023)  Tobacco Use: Medium Risk (04/05/2024)   SDOH Interventions:     Readmission Risk Interventions    04/13/2022   12:21 PM  Readmission Risk Prevention Plan  Transportation Screening Complete  PCP or Specialist Appt within 3-5 Days Complete  HRI or Home Care Consult Complete  Social Work Consult for Recovery Care Planning/Counseling Complete  Palliative Care Screening Not Applicable  Medication Review Oceanographer) Complete

## 2024-04-07 NOTE — Plan of Care (Signed)
   Problem: Clinical Measurements: Goal: Ability to maintain clinical measurements within normal limits will improve Outcome: Progressing   Problem: Activity: Goal: Risk for activity intolerance will decrease Outcome: Progressing

## 2024-04-07 NOTE — Progress Notes (Signed)
 HD#2 Subjective:   Summary: This is a 58 year old female with a past medical history of COPD, HFrEF, type 2 diabetes who presented to the emergency department with concerns of dyspnea and found to have concern for heart failure exacerbation.  Imaging also revealed potential bronchopneumonia.  Patient was started antibiotics and admitted for further evaluation and management.  Overnight Events: No acute events overnight  Patient endorsed having headache as well as some abdominal pain today.  She has not had a bowel movement in 2 days.  She reports that she is eating well.  She denies any vomit.  She reports having some headaches, but otherwise is doing okay.  Objective:  Vital signs in last 24 hours: Vitals:   04/07/24 0057 04/07/24 0451 04/07/24 0650 04/07/24 0808  BP: (!) 121/93 116/87 107/81 103/67  Pulse: 78 78 80 79  Resp: 18 18  16   Temp: 98.1 F (36.7 C) 97.8 F (36.6 C)  98.2 F (36.8 C)  TempSrc: Oral Oral  Oral  SpO2: 97% 92%  96%  Weight:  66.3 kg    Height:       Supplemental O2: Room Air SpO2: 92 %   Physical Exam:  Constitutional: Resting in bed, no acute distress Cardiovascular: regular rate and rhythm, no m/r/g Pulmonary/Chest: normal work of breathing on room air, lungs clear to auscultation bilaterally Abdominal: Soft, some tenderness appreciated left lower quadrant  Filed Weights   04/05/24 1500 04/06/24 0422 04/07/24 0451  Weight: 67.8 kg 67.8 kg 66.3 kg     Intake/Output Summary (Last 24 hours) at 04/07/2024 1017 Last data filed at 04/07/2024 0808 Gross per 24 hour  Intake 340 ml  Output 2100 ml  Net -1760 ml   Net IO Since Admission: -2,142.56 mL [04/07/24 1017]  Pertinent Labs:    Latest Ref Rng & Units 04/07/2024    3:08 AM 04/06/2024    2:45 AM 04/05/2024   10:21 AM  CBC  WBC 4.0 - 10.5 K/uL 9.0  9.1    Hemoglobin 12.0 - 15.0 g/dL 32.4  40.1  02.7    25.3   Hematocrit 36.0 - 46.0 % 45.2  40.0  40.0    39.0   Platelets 150 - 400  K/uL 273  207         Latest Ref Rng & Units 04/07/2024    3:08 AM 04/06/2024    2:45 AM 04/05/2024    2:32 PM  CMP  Glucose 70 - 99 mg/dL 664  403  474   BUN 6 - 20 mg/dL 31  28  21    Creatinine 0.44 - 1.00 mg/dL 2.59  5.63  8.75   Sodium 135 - 145 mmol/L 136  136  143   Potassium 3.5 - 5.1 mmol/L 3.8  4.1  4.1   Chloride 98 - 111 mmol/L 95  101  105   CO2 22 - 32 mmol/L 27  27  25    Calcium  8.9 - 10.3 mg/dL 9.2  8.5  8.9     Imaging: No results found.  Assessment/Plan:   Principal Problem:   Acute respiratory distress   Patient Summary: Stacey Lin is a 58 y.o. female with a past medical history of COPD, HFrEF, type 2 diabetes who presented to the emergency department with concerns of dyspnea and found to have concern for heart failure exacerbation.  Imaging also revealed potential bronchopneumonia.  Patient was started antibiotics and admitted for further evaluation and management.  #HFrEF exacerbation #Viral pneumonia Patient  evaluated bedside this morning.  She states her shortness of breath is the same as yesterday.  She has no major concerns.  She states she is having some trouble getting up and moving, otherwise is doing okay.  She does have some concern for cough.  On my exam today, patient does have clear sounding lungs.  Pro-Cal was less than 10, pointing away from bacterial cause of pneumonia.  This is likely a viral pneumonia.  I think in the combination of viral pneumonia patient had a heart failure exacerbation.  Patient is diuresing well.  She has had 2.1 L out for hospitalization.  Patient is no longer requiring oxygen.  Patient is improving well. -Will have physical therapy work with patient, currently recommending SNF - Continue diuresing with Lasix  40 mg twice daily - Stop azithromycin  and ceftriaxone  -Continue Coreg  3.125 mg twice daily - Continue Farxiga  10 mg daily  #COPD exacerbation, resolving Patient was initially admitted for COPD exacerbation as  well.  No longer wheezing.  She is doing well.  No longer requiring oxygen.  Awaiting safe discharge plan - Continue Tessalon  Perles - Continue Breo Ellipta  - As needed DuoNebs  #Type 2 diabetes mellitus Patient has a past medical history of type 2 diabetes.  Most recent A1c in May 2025 was 5.8.  Blood sugars are measuring well here. - Continue SSI  #GERD -Continue pantoprazole  40 mg daily  #Hyperlipidemia - Continue Lipitor 40 mg daily   Diet: Heart Healthy IVF: None,None VTE: Enoxaparin  Code: DNR PT/OT recs: SNF for Subacute PT  Dispo: Anticipated discharge to Skilled nursing facility in 3 days pending SNF Placement.   Jonelle Neri DO Internal Medicine Resident PGY-2 Please contact the on call pager after 5 pm and on weekends at [phone].

## 2024-04-08 ENCOUNTER — Inpatient Hospital Stay (HOSPITAL_COMMUNITY)

## 2024-04-08 DIAGNOSIS — L299 Pruritus, unspecified: Secondary | ICD-10-CM | POA: Diagnosis not present

## 2024-04-08 DIAGNOSIS — J129 Viral pneumonia, unspecified: Secondary | ICD-10-CM

## 2024-04-08 DIAGNOSIS — I5023 Acute on chronic systolic (congestive) heart failure: Secondary | ICD-10-CM | POA: Diagnosis not present

## 2024-04-08 DIAGNOSIS — R1011 Right upper quadrant pain: Secondary | ICD-10-CM | POA: Diagnosis not present

## 2024-04-08 DIAGNOSIS — K838 Other specified diseases of biliary tract: Secondary | ICD-10-CM | POA: Diagnosis not present

## 2024-04-08 DIAGNOSIS — R17 Unspecified jaundice: Secondary | ICD-10-CM | POA: Diagnosis not present

## 2024-04-08 DIAGNOSIS — R519 Headache, unspecified: Secondary | ICD-10-CM

## 2024-04-08 DIAGNOSIS — N179 Acute kidney failure, unspecified: Secondary | ICD-10-CM | POA: Diagnosis not present

## 2024-04-08 LAB — HEPATIC FUNCTION PANEL
ALT: 23 U/L (ref 0–44)
AST: 19 U/L (ref 15–41)
Albumin: 3.6 g/dL (ref 3.5–5.0)
Alkaline Phosphatase: 101 U/L (ref 38–126)
Bilirubin, Direct: <0.1 mg/dL (ref 0.0–0.2)
Total Bilirubin: 0.6 mg/dL (ref 0.0–1.2)
Total Protein: 6.5 g/dL (ref 6.5–8.1)

## 2024-04-08 LAB — RENAL FUNCTION PANEL
Albumin: 3.6 g/dL (ref 3.5–5.0)
Anion gap: 10 (ref 5–15)
BUN: 38 mg/dL — ABNORMAL HIGH (ref 6–20)
CO2: 30 mmol/L (ref 22–32)
Calcium: 9.6 mg/dL (ref 8.9–10.3)
Chloride: 97 mmol/L — ABNORMAL LOW (ref 98–111)
Creatinine, Ser: 1.4 mg/dL — ABNORMAL HIGH (ref 0.44–1.00)
GFR, Estimated: 43 mL/min — ABNORMAL LOW (ref >60)
Glucose, Bld: 158 mg/dL — ABNORMAL HIGH (ref 70–99)
Phosphorus: 5.1 mg/dL — ABNORMAL HIGH (ref 2.5–4.6)
Potassium: 4.7 mmol/L (ref 3.5–5.1)
Sodium: 137 mmol/L (ref 135–145)

## 2024-04-08 LAB — GLUCOSE, CAPILLARY
Glucose-Capillary: 127 mg/dL — ABNORMAL HIGH (ref 70–99)
Glucose-Capillary: 197 mg/dL — ABNORMAL HIGH (ref 70–99)
Glucose-Capillary: 108 mg/dL — ABNORMAL HIGH (ref 70–99)
Glucose-Capillary: 189 mg/dL — ABNORMAL HIGH (ref 70–99)

## 2024-04-08 LAB — MAGNESIUM: Magnesium: 2.6 mg/dL — ABNORMAL HIGH (ref 1.7–2.4)

## 2024-04-08 MED ORDER — DIPHENHYDRAMINE HCL 25 MG PO CAPS
25.0000 mg | ORAL_CAPSULE | Freq: Once | ORAL | Status: AC
  Administered 2024-04-08: 25 mg via ORAL
  Filled 2024-04-08: qty 1

## 2024-04-08 MED ORDER — POLYETHYLENE GLYCOL 3350 17 G PO PACK
17.0000 g | PACK | Freq: Every day | ORAL | Status: DC
  Filled 2024-04-08 (×2): qty 1

## 2024-04-08 MED ORDER — HYDROXYZINE HCL 25 MG PO TABS
25.0000 mg | ORAL_TABLET | Freq: Two times a day (BID) | ORAL | Status: AC
  Administered 2024-04-08 – 2024-04-09 (×4): 25 mg via ORAL
  Filled 2024-04-08 (×4): qty 1

## 2024-04-08 MED ORDER — GABAPENTIN 300 MG PO CAPS
300.0000 mg | ORAL_CAPSULE | Freq: Two times a day (BID) | ORAL | Status: DC
  Administered 2024-04-08 – 2024-04-10 (×4): 300 mg via ORAL
  Filled 2024-04-08 (×4): qty 1

## 2024-04-08 MED ORDER — SENNOSIDES-DOCUSATE SODIUM 8.6-50 MG PO TABS
1.0000 | ORAL_TABLET | Freq: Two times a day (BID) | ORAL | Status: DC
  Administered 2024-04-08 – 2024-04-10 (×2): 1 via ORAL
  Filled 2024-04-08 (×4): qty 1

## 2024-04-08 MED ORDER — PROCHLORPERAZINE EDISYLATE 10 MG/2ML IJ SOLN
5.0000 mg | Freq: Once | INTRAMUSCULAR | Status: AC
  Administered 2024-04-08: 5 mg via INTRAVENOUS
  Filled 2024-04-08: qty 2

## 2024-04-08 MED ORDER — INSULIN ASPART 100 UNIT/ML IJ SOLN
0.0000 [IU] | Freq: Three times a day (TID) | INTRAMUSCULAR | Status: DC
  Administered 2024-04-09: 2 [IU] via SUBCUTANEOUS

## 2024-04-08 NOTE — Progress Notes (Signed)
 Mobility Specialist Progress Note:   04/08/24 1352  Mobility  Activity Ambulated with assistance in hallway  Level of Assistance Contact guard assist, steadying assist  Assistive Device None  Distance Ambulated (ft) 100 ft  Activity Response Tolerated well  Mobility Referral Yes  Mobility visit 1 Mobility  Mobility Specialist Start Time (ACUTE ONLY) 1318  Mobility Specialist Stop Time (ACUTE ONLY) 1323  Mobility Specialist Time Calculation (min) (ACUTE ONLY) 5 min   Received pt in bed having no complaints and agreeable to mobility. Pt was asymptomatic throughout ambulation and returned to room w/o fault. Left in bed w/ call bell in reach and all needs met.   D'Vante Nolon Baxter Mobility Specialist Please contact via Special educational needs teacher or Rehab office at [phone]

## 2024-04-08 NOTE — Progress Notes (Signed)
 04/08/24 1400  Spiritual Encounters  Type of Visit Initial  Care provided to: Patient  Referral source Nurse (RN/NT/LPN)  Reason for visit Advance directives   Chaplain responded to a consult request for Advance Directive education.  Chaplain provided the Advance Directive packet as well as education on Advance Directives-documents an individual completes to communicate their health care directions in advance of a time when they may need them. Chaplain informed pt the documents which may be completed here in the hospital are the Living Will and Health Care Power of [place].  Chaplain informed that the Health Care Power of [place] is a legal document in which an individual names another person, their Health Care Agent, to make health care decisions when the individual is not able to make them for themselves. The Health Care Agent's function can be temporary or permanent depending on the pt's ability to make and communicate those decisions independently. Chaplain informed pt in the absence of a Health Care Power of Attorney, the state of [place]  directs health care providers to look to the following individuals in the order listed: legal guardian; an attorney?in?fact under a general power of attorney (POA) if that POA includes the right to make health care decisions; a husband or wife; a majority of parents and adult children; a majority of adult brothers and sisters; or an individual who has an established relationship with you, who is acting in good faith and who can convey your wishes.  If none of these person are available or willing to make medical decisions on a patient's behalf, the law allows the patient's doctor to make decisions for them as long as another doctor agrees with those decisions.  Chaplain also informed the patient that the Health Care agent has no decision-making authority over any affairs other than those related to his or her medical care.  The chaplain further  educated the pt that a Living Will is a legal document that allows an individual to state his or her desire not to receive life-prolonging measures in the event that they have a condition that is incurable and will result in their death in a short period of time; they are unconscious, and doctors are confident that they will not regain consciousness; and/or they have advanced dementia or other substantial and irreversible loss of mental function. The chaplain informed pt that life-prolonging measures are medical treatments that would only serve to postpone death, including breathing machines, kidney dialysis, antibiotics, artificial nutrition and hydration (tube feeding), and similar forms of treatment and that if an individual is able to express their wishes, they may also make them known without the use of a Living Will, but in the event that an individual is not able to express their wishes themselves, a Living Will allows medical providers and the pt's family and friends ensure that they are not making decisions on the pt's behalf, but rather serving as the pt's voice to convey decisions the pt has already made.  The patient is aware that the decision to create an advance directive is theirs alone and they may chose not to complete the documents or may chose to complete one portion or both.  The patient was informed that they can revoke the documents at any time by striking through them and writing void or by completing new documents, but that it is also advisable that the individual verbally notify interested parties that their wishes have changed.  They are also aware that the document must be signed in  the presence of a notary public and two witnesses and that this can be done while the patient is still admitted to the hospital or after discharge in the community. If they decide to complete Advance Directives after being discharged from the hospital, they have been advised to notify all interested parties  and to provide those documents to their physicians and loved ones in addition to bringing them to the hospital in the event of another hospitalization.  The chaplain informed the pt that if they desire to proceed with completing Advance Directive Documentation while they are still admitted, notary services are typically available at [facility] between the hours of 1:00 and 3:30 Monday-Thursday.    When the patient is ready to have these documents completed, the patient should request that their nurse place a spiritual care consult and indicate that the patient is ready to have their advance directives notarized so that arrangements for witnesses and notary public can be made.  The document was notarized with a notary and two witnesses. The copies were given to the patient.   M.Kubra Welby Hale Resident [phone]

## 2024-04-08 NOTE — Progress Notes (Signed)
  Progress Note   Date: 04/08/2024  Patient Name: Stacey Lin        MRN#: [id number]  Review the patient's clinical findings supports the diagnosis of:   AKI related to therapeutic diuretic administration

## 2024-04-08 NOTE — Care Management Important Message (Signed)
 Important Message  Patient Details  Name: Stacey Lin MRN: [id number] Date of Birth: [birth year]   Important Message Given:  Yes - Medicare IM     Felix Host 04/08/2024, 1:29 PM

## 2024-04-08 NOTE — Progress Notes (Cosign Needed Addendum)
 HD#4 Subjective:   Summary: This is a 58 year old female with a past medical history of COPD, HFrEF, type 2 diabetes who presented to the emergency department with dyspnea and admitted for acute hypoxic respiratory failure secondary to HFrEF exacerbation and mild COPD.  Overnight Events: No acute events overnight.  This morning examined at bedside.  She endorses intermittent night sweats and pruritus on her arms and lower legs.  No new rashes. Her last bowel movement was 2 days ago, still passing gas.  Objective:  Vital signs in last 24 hours: Vitals:   04/07/24 2024 04/08/24 0500 04/08/24 0524 04/08/24 0819  BP: 99/67  97/75 111/70  Pulse: 83  90 77  Resp: 18  18 17   Temp: 98 F (36.7 C)  98 F (36.7 C) 97.7 F (36.5 C)  TempSrc: Oral  Oral Oral  SpO2: 96%  97% 96%  Weight:  66.3 kg    Height:       Supplemental O2: Room Air SpO2: 92 %   Physical Exam:   Constitutional: NAD. Eyes: No scleral icterus. Cardiovascular: regular rate and rhythm, no m/r/g Pulmonary/Chest: Clear bilateral lungs. Abdominal: Mild abdominal distention, mildly tender in the right upper quadrant.  Normal bowel sounds.  Filed Weights   04/06/24 0422 04/07/24 0451 04/08/24 0500  Weight: 67.8 kg 66.3 kg 66.3 kg     Intake/Output Summary (Last 24 hours) at 04/08/2024 1335 Last data filed at 04/07/2024 1754 Gross per 24 hour  Intake 236 ml  Output --  Net 236 ml   Net IO Since Admission: -2,756.56 mL [04/08/24 1335]  Pertinent Labs:    Latest Ref Rng & Units 04/07/2024    3:08 AM 04/06/2024    2:45 AM 04/05/2024   10:21 AM  CBC  WBC 4.0 - 10.5 K/uL 9.0  9.1    Hemoglobin 12.0 - 15.0 g/dL 84.1  32.4  40.1    02.7   Hematocrit 36.0 - 46.0 % 45.2  40.0  40.0    39.0   Platelets 150 - 400 K/uL 273  207         Latest Ref Rng & Units 04/08/2024    6:11 AM 04/07/2024    3:08 AM 04/06/2024    2:45 AM  CMP  Glucose 70 - 99 mg/dL 253  664  403   BUN 6 - 20 mg/dL 38  31  28   Creatinine  0.44 - 1.00 mg/dL 4.74  2.59  5.63   Sodium 135 - 145 mmol/L 137  136  136   Potassium 3.5 - 5.1 mmol/L 4.7  3.8  4.1   Chloride 98 - 111 mmol/L 97  95  101   CO2 22 - 32 mmol/L 30  27  27    Calcium  8.9 - 10.3 mg/dL 9.6  9.2  8.5     Imaging: No results found.  Assessment/Plan:   Principal Problem:   Acute respiratory distress Active Problems:   TOBACCO USER   Essential hypertension, benign   Chronic systolic HF (heart failure) (HCC)   Patient Summary: Verneda Hollopeter is a 58 y.o. female with a past medical history of COPD, HFrEF, type 2 diabetes who presented to the emergency department with concerns of dyspnea and found to have concern for heart failure exacerbation.  Imaging also revealed potential bronchopneumonia.  Patient was started antibiotics and admitted for further evaluation and management.  #HFrEF exacerbation #Viral pneumonia Respiratory status has improved, satting normal on room air.  She had a  good urinary output yesterday, on exam she is clinically euvolemic.  Will discontinue IV Lasix , and start home dose Lasix  40 mg daily.  Her GDMT regimen includes losartan  100 mg, and spironolactone , holding these medicines due to softer blood pressures.   - Discontinue IV Lasix  - Will restart p.o. Lasix  when SCr is stable. - Continue Coreg  3.125 mg twice daily - Continue Farxiga  10 mg daily - SW working on SNF placement.  # Acute kidney injury Prerenal, in the setting of diuresis.  SCr 1.40.  Low concern for postrenal or intrarenal etiology based on labs. - Holding diuresis. - RFP - Avoid nephrotoxic drugs.  # Constipation # Abdominal tenderness # Pruritus Diffuse pruritus, and tenderness in the right abdomen, I query if patient has liver disease.   Will get LFTs, and a right upper quadrant ultrasound.  Abdominal tenderness could also be secondary to constipation, last bowel movement was 2 days ago.  Plan: - Hydroxyzine  25 mg. - MiraLAX  scheduled daily. - Will  increase senna to twice daily.  Headaches She is  endorsing poorly localized headache.  Denies neck stiffness, nausea/vomiting or vision changes.  Patient's headache could be secondary to withdrawal from gabapentin .  Per chart review, she has not been getting this medicine while hospitalized.  Ongoing AKI, and GFR of 43, maximum gabapentin  per day 900 mg/day. - Headache cocktail: IV Compazine 5 mg, and benadryl  25mg . - Start gabapentin  300 mg twice daily. - Tylenol  as needed for pain.  #Type 2 diabetes mellitus Blood glucose 130-180. - Continue SSI  #GERD -Continue pantoprazole  40 mg daily  #Hyperlipidemia - Continue Lipitor 40 mg daily  Resolved problems Mild COPD exacerbation.   Diet: Heart Healthy IVF: None,None VTE: Enoxaparin  Code: DNR PT/OT recs: SNF for Subacute PT  Dispo: Anticipated discharge to Skilled nursing facility in 3 days pending SNF Placement.   Allexus Ovens, M.D.  Internal Medicine Resident, PGY-1 [facility]   1:35 PM, 04/08/2024

## 2024-04-08 NOTE — TOC Progression Note (Signed)
 Transition of Care [facility]) - Progression Note    Patient Details  Name: Stacey Lin MRN: [id number] Date of Birth: 28-Jun-1966  Transition of Care [facility]) CM/SW Contact  Katrinka Parr, [place] Phone Number: 04/08/2024, 4:32 PM  Clinical Narrative:     CSW met with pt and provided SNF bed offers. She is going to discuss SNF facilities with her daughter and will follow up with CSW with choice.   Expected Discharge Plan: Skilled Nursing Facility Barriers to Discharge: Continued Medical Work up, snf choice, snf auth  Expected Discharge Plan and Services       Living arrangements for the past 2 months: Skilled Nursing Facility                                       Social Determinants of Health (SDOH) Interventions SDOH Screenings   Food Insecurity: No Food Insecurity (04/05/2024)  Recent Concern: Food Insecurity - Food Insecurity Present (02/28/2024)  Housing: Low Risk  (04/05/2024)  Recent Concern: Housing - High Risk (02/28/2024)  Transportation Needs: No Transportation Needs (04/05/2024)  Recent Concern: Transportation Needs - Unmet Transportation Needs (02/28/2024)  Utilities: Not At Risk (04/05/2024)  Recent Concern: Utilities - At Risk (02/28/2024)  Depression (PHQ2-9): High Risk (10/19/2023)  Financial Resource Strain: High Risk (12/21/2023)  Physical Activity: Unknown (10/18/2023)  Recent Concern: Physical Activity - Inactive (08/02/2023)  Social Connections: Moderately Integrated (04/05/2024)  Stress: Stress Concern Present (10/18/2023)  Tobacco Use: Medium Risk (04/05/2024)    Readmission Risk Interventions    04/13/2022   12:21 PM  Readmission Risk Prevention Plan  Transportation Screening Complete  PCP or Specialist Appt within 3-5 Days Complete  HRI or Home Care Consult Complete  Social Work Consult for Recovery Care Planning/Counseling Complete  Palliative Care Screening Not Applicable  Medication Review Oceanographer) Complete

## 2024-04-08 NOTE — Progress Notes (Signed)
 Per radiology pt needs to be NPO for 6 hours prior to ultrasound of the abdomen. Pt has been NPO since 1300.

## 2024-04-08 NOTE — Progress Notes (Signed)
 Heart Failure Navigator Progress Note  Assessed for Heart & Vascular TOC clinic readiness.  Patient does not meet criteria due to Advanced Heart Failure Team patient of Dr. Gala Romney. .   Navigator will sign off at this time.   Rhae Hammock, BSN, Scientist, clinical (histocompatibility and immunogenetics) Only

## 2024-04-08 NOTE — Plan of Care (Signed)
   Problem: Activity: Goal: Risk for activity intolerance will decrease Outcome: Progressing   Problem: Nutrition: Goal: Adequate nutrition will be maintained Outcome: Progressing   Problem: Pain Managment: Goal: General experience of comfort will improve and/or be controlled Outcome: Progressing   Problem: Safety: Goal: Ability to remain free from injury will improve Outcome: Progressing

## 2024-04-09 DIAGNOSIS — J129 Viral pneumonia, unspecified: Secondary | ICD-10-CM | POA: Diagnosis not present

## 2024-04-09 DIAGNOSIS — I5023 Acute on chronic systolic (congestive) heart failure: Secondary | ICD-10-CM | POA: Diagnosis not present

## 2024-04-09 DIAGNOSIS — L299 Pruritus, unspecified: Secondary | ICD-10-CM | POA: Diagnosis not present

## 2024-04-09 DIAGNOSIS — N179 Acute kidney failure, unspecified: Secondary | ICD-10-CM | POA: Diagnosis not present

## 2024-04-09 LAB — RENAL FUNCTION PANEL
Albumin: 3.3 g/dL — ABNORMAL LOW (ref 3.5–5.0)
Anion gap: 8 (ref 5–15)
BUN: 35 mg/dL — ABNORMAL HIGH (ref 6–20)
CO2: 25 mmol/L (ref 22–32)
Calcium: 8.8 mg/dL — ABNORMAL LOW (ref 8.9–10.3)
Chloride: 105 mmol/L (ref 98–111)
Creatinine, Ser: 1.39 mg/dL — ABNORMAL HIGH (ref 0.44–1.00)
GFR, Estimated: 43 mL/min — ABNORMAL LOW (ref >60)
Glucose, Bld: 119 mg/dL — ABNORMAL HIGH (ref 70–99)
Phosphorus: 5.2 mg/dL — ABNORMAL HIGH (ref 2.5–4.6)
Potassium: 4.4 mmol/L (ref 3.5–5.1)
Sodium: 138 mmol/L (ref 135–145)

## 2024-04-09 LAB — MAGNESIUM: Magnesium: 2.8 mg/dL — ABNORMAL HIGH (ref 1.7–2.4)

## 2024-04-09 LAB — GLUCOSE, CAPILLARY
Glucose-Capillary: 188 mg/dL — ABNORMAL HIGH (ref 70–99)
Glucose-Capillary: 106 mg/dL — ABNORMAL HIGH (ref 70–99)
Glucose-Capillary: 160 mg/dL — ABNORMAL HIGH (ref 70–99)
Glucose-Capillary: 136 mg/dL — ABNORMAL HIGH (ref 70–99)

## 2024-04-09 MED ORDER — INSULIN ASPART 100 UNIT/ML IJ SOLN
0.0000 [IU] | Freq: Three times a day (TID) | INTRAMUSCULAR | Status: DC
  Administered 2024-04-09: 1 [IU] via SUBCUTANEOUS

## 2024-04-09 NOTE — Progress Notes (Signed)
 Nurse requested Mobility Specialist to perform oxygen saturation test with pt which includes removing pt from oxygen both at rest and while ambulating.  Below are the results from that testing.     Patient Saturations on Room Air at Rest = spO2 100%  Patient Saturations on Room Air while Ambulating = sp02 97% .  Rested and performed pursed lip breathing for 1 minute with sp02 at 98%.  At end of testing pt left in room on room air.  Reported results to nurse.   D'Vante Nolon Baxter Mobility Specialist Please contact via Special educational needs teacher or Rehab office at [phone]

## 2024-04-09 NOTE — Progress Notes (Addendum)
 HD#4 Subjective:   Summary: This is a 58 year old female with a past medical history of COPD, HFrEF, type 2 diabetes who presented to the emergency department with dyspnea and admitted for acute hypoxic respiratory failure secondary to HFrEF exacerbation and mild COPD.  Overnight Events: No acute events overnight.  This morning examined at bedside.  Patient says she has been able to get out of bed, endorses exertional dyspnea.  No chest pain. She had 4 bowel movement yesterday, abdomen pain mildly improved but still tender on the right side.  She  endorses right forearm bruises after her insulin  shot yesterday.    Objective:  Vital signs in last 24 hours: Vitals:   04/08/24 2040 04/09/24 0500 04/09/24 0520 04/09/24 0743  BP: 112/60  103/72 92/61  Pulse: 77  73 78  Resp: 18  18 16   Temp: 97.9 F (36.6 C)  97.7 F (36.5 C) 98.3 F (36.8 C)  TempSrc: Oral  Oral Oral  SpO2: 97%  100% 96%  Weight:  71.5 kg    Height:       Supplemental O2: Room Air SpO2: 92 %   Physical Exam:   Constitutional: NAD. Eyes: No scleral icterus. Cardiovascular: regular rate and rhythm, no m/r/g Pulmonary/Chest: Clear bilateral lungs. Abdominal: Abdomen tender to palpation in the RUQ, with dullness to percussion in RUQ.  Rest of the abdomen hyperresonant to percussion.  Normal bowel sounds. Skin: Superficial bruises on right triceps. No signs of active bleeding, infection, or induration Filed Weights   04/07/24 0451 04/08/24 0500 04/09/24 0500  Weight: 66.3 kg 66.3 kg 71.5 kg     Intake/Output Summary (Last 24 hours) at 04/09/2024 1205 Last data filed at 04/09/2024 0830 Gross per 24 hour  Intake 480 ml  Output --  Net 480 ml   Net IO Since Admission: -2,036.56 mL [04/09/24 1205]  Pertinent Labs:    Latest Ref Rng & Units 04/07/2024    3:08 AM 04/06/2024    2:45 AM 04/05/2024   10:21 AM  CBC  WBC 4.0 - 10.5 K/uL 9.0  9.1    Hemoglobin 12.0 - 15.0 g/dL 16.1  09.6  04.5    40.9    Hematocrit 36.0 - 46.0 % 45.2  40.0  40.0    39.0   Platelets 150 - 400 K/uL 273  207         Latest Ref Rng & Units 04/09/2024    6:31 AM 04/08/2024    6:11 AM 04/07/2024    3:08 AM  CMP  Glucose 70 - 99 mg/dL 811  914  782   BUN 6 - 20 mg/dL 35  38  31   Creatinine 0.44 - 1.00 mg/dL 9.56  2.13  0.86   Sodium 135 - 145 mmol/L 138  137  136   Potassium 3.5 - 5.1 mmol/L 4.4  4.7  3.8   Chloride 98 - 111 mmol/L 105  97  95   CO2 22 - 32 mmol/L 25  30  27    Calcium  8.9 - 10.3 mg/dL 8.8  9.6  9.2   Total Protein 6.5 - 8.1 g/dL  6.5    Total Bilirubin 0.0 - 1.2 mg/dL  0.6    Alkaline Phos 38 - 126 U/L  101    AST 15 - 41 U/L  19    ALT 0 - 44 U/L  23      Imaging: US  Abdomen Limited RUQ (LIVER/GB) Result Date: 04/09/2024 EXAM: Right Upper Quadrant  Abdominal Ultrasound 04/08/2024 11:57:00 PM TECHNIQUE: Real-time ultrasonography of the right upper quadrant of the abdomen was performed. COMPARISON: None available. CLINICAL HISTORY: Jaundice; RUQ pain. FINDINGS: LIVER: The liver demonstrates normal echogenicity. No intrahepatic biliary ductal dilatation. No evidence of mass. BILIARY SYSTEM: Mild layering gallbladder sludge. No pericholecystic fluid or wall thickening. No cholelithiasis. Negative sonographic Murphy's sign. Common bile duct is within normal limits measuring 5 mm. RIGHT KIDNEY: The right kidney is grossly unremarkable in appearance without evidence of hydronephrosis, echogenic calculi or worrisome mass lesions. PANCREAS: Visualized portions of the pancreas are unremarkable. OTHER: No right upper quadrant ascites. IMPRESSION: 1. Mild layering gallbladder sludge, without associated sonographic findings to suggest acute cholecystitis. Electronically signed by: Zadie Herter MD 04/09/2024 12:03 AM EDT RP Workstation: [id number]    Assessment/Plan:   Principal Problem:   Acute respiratory distress Active Problems:   TOBACCO USER   Essential hypertension, benign   Chronic  systolic HF (heart failure) (HCC)   Patient Summary: Stacey Lin is a 58 y.o. female with a past medical history of COPD, HFrEF, type 2 diabetes who presented to the emergency department with concerns of dyspnea and found to have concern for heart failure exacerbation.  Imaging also revealed potential bronchopneumonia.  Patient was started antibiotics and admitted for further evaluation and management.  #HFrEF exacerbation #Viral pneumonia # Exertional dyspnea The patient is clinically improved and currently euvolemic. Oral Lasix  has been held as Scr has not recovered. Home GDMT has also been held due to softer blood pressures and ongoing AKI.  The patient's exertional dyspnea is likely related to physical deconditioning from prolonged bedrest; ambulatory pulse oximetry will be performed for further evaluation.  Physical therapy recommends skilled nursing facility (SNF) placement. The patient has elected to go to [facility], and SNF authorization has been initiated. - BMP. - Will restart p.o. Lasix  when SCr is stable. - Continue Coreg  3.125 mg twice daily - Continue Farxiga  10 mg daily   # Acute kidney injury Prerenal, in the setting of diuresis.  Yesterday we held diuresis as result of AKI, Scr has not recovered yet. - BMP  - Holding diuresis. - Avoid nephrotoxic drugs.  # Constipation # Abdominal tenderness # Pruritus Normal LFTs on labs.  RUQ ultrasound showed mild biliary sludge without cholecystitis.  Pruritus has improved with hydroxyzine .  Patient's abdominal pain is 2/2 constipation, as evidenced by her abdominal exam.  Plan: - Hydroxyzine  25 mg. - MiraLAX  scheduled daily. - CW senna twice daily.  # Type 2 diabetes # Superficial skin bruises Bruising on upper arm 2/2 insulin  injection.  Per chart review, she is needing very little SSI. - Will change SSI to very sensitive scale. - Cold compressors to the area.  Headaches, chronic. Improved, no acute  concerns. - Gabapentin  300 mg twice daily. - Tylenol  as needed for pain.  # Hot flashes Per chart review, hot flashes has been on going since 2019.  I have low suspicion for infection as she is afebrile, with no leukocytosis.  Low suspicion for malignancy, as she has no lymphadenopathy or ongoing weight loss.  I suspect hot flashes are secondary to Vasomotor symptoms of menopause.  Although her last menstrual cycle was about 8 years ago, some patient has vasomotor symptoms persist past 10 years after their last menstrual period. - Follow-up with PCP outpatient for hot flashes.   - Possible treatment regimen includes short course HRT versus paroxetine versus CBT.  #GERD -Continue pantoprazole  40 mg daily  #Hyperlipidemia - Continue Lipitor  40 mg daily  Resolved problems Mild COPD exacerbation.   Diet: Heart Healthy IVF: None,None VTE: Enoxaparin  Code: DNR PT/OT recs: SNF for Subacute PT  Dispo: Anticipated discharge to Skilled nursing facility in 3 days pending SNF Placement.   Jamesyn Lindell, M.D.  Internal Medicine Resident, PGY-1 [facility]   12:05 PM, 04/09/2024

## 2024-04-09 NOTE — TOC Progression Note (Signed)
 Transition of Care [facility]) - Progression Note    Patient Details  Name: Stacey Lin MRN: [id number] Date of Birth: Jan 17, 1966  [facility]) CM/SW Contact  Katrinka Parr, [place] Phone Number: 04/09/2024, 11:50 AM  Clinical Narrative:     CSW met with pt for SNF choice. She chooses [facility]. CSW confirmed bed offer with [facility].   SNF auth initiated in online portal. Auth#  [id number] SNF auth is pending.   Expected Discharge Plan: Skilled Nursing Facility Barriers to Discharge: Insurance Authorization  Expected Discharge Plan and Services       Living arrangements for the past 2 months: Skilled Nursing Facility                                       Social Determinants of Health (SDOH) Interventions SDOH Screenings   Food Insecurity: No Food Insecurity (04/05/2024)  Recent Concern: Food Insecurity - Food Insecurity Present (02/28/2024)  Housing: Low Risk  (04/05/2024)  Recent Concern: Housing - High Risk (02/28/2024)  Transportation Needs: No Transportation Needs (04/05/2024)  Recent Concern: Transportation Needs - Unmet Transportation Needs (02/28/2024)  Utilities: Not At Risk (04/05/2024)  Recent Concern: Utilities - At Risk (02/28/2024)  Depression (PHQ2-9): High Risk (10/19/2023)  Financial Resource Strain: High Risk (12/21/2023)  Physical Activity: Unknown (10/18/2023)  Recent Concern: Physical Activity - Inactive (08/02/2023)  Social Connections: Moderately Integrated (04/05/2024)  Stress: Stress Concern Present (10/18/2023)  Tobacco Use: Medium Risk (04/05/2024)    Readmission Risk Interventions    04/13/2022   12:21 PM  Readmission Risk Prevention Plan  Transportation Screening Complete  PCP or Specialist Appt within 3-5 Days Complete  HRI or Home Care Consult Complete  Social Work Consult for Recovery Care Planning/Counseling Complete  Palliative Care Screening Not Applicable  Medication Review Oceanographer) Complete

## 2024-04-09 NOTE — Progress Notes (Signed)
 Physical Therapy Treatment Patient Details Name: Stacey Lin MRN: [id number] DOB: [birth year] Today's Date: 04/09/2024   History of Present Illness Patient is 58 yo female admitted on 04/05/24 for acute respiratory distress. Found to have acute exacerbation of chronic HFrEF as well as a community-acquired pneumonia. PMH significant for HFrEF LV, COPD, HLD, HTN, Gout, Depression, GERD, Anxiety, Diabetes, previous substance use disorder.    PT Comments   Pt supine in bed on arrival.  She reports she is waiting on hold to talk to [facility] dept.  Pt able to get OOB and standing around 90 sec holding to counter while talking on the phone.  She is fatigued after phone call and required rest break.  Reports legs feel wobbly and she feel dizzy  Pt performed short bout of gt training without device.  Pt noted to be grabbing for furniture, rails, doorways, and finally supported by HHA from PTA.  Pt distance in limited due to weakness and increased WOB.  Spoke with patient about the importance of OOB mobility to improve strength, activity tolerance and functional independence.  Pt continues to benefit from skilled rehab in a post acute setting to maximize functional gains before returning home.  Pt educated on POC and plan next session is for high level balance training to improve right reactions and balance during dynamic activities to reduce falls at home.      If plan is discharge home, recommend the following: A lot of help with bathing/dressing/bathroom;A lot of help with walking and/or transfers;Assistance with cooking/housework;Help with stairs or ramp for entrance;Assist for transportation   Can travel by private vehicle     No  Equipment Recommendations  Rolling walker (2 wheels)    Recommendations for Other Services       Precautions / Restrictions Precautions Precautions: Fall Restrictions Weight Bearing Restrictions Per Provider Order: No     Mobility  Bed Mobility Overal bed mobility:  Modified Independent                  Transfers Overall transfer level: Needs assistance   Transfers: Sit to/from Stand Sit to Stand: Contact guard assist           General transfer comment: CGA for safety as patient reaching for rail on bed in standing.  Furniture cruising.    Ambulation/Gait Ambulation/Gait assistance: Min assist Gait Distance (Feet): 60 Feet Assistive device: 1 person hand held assist Gait Pattern/deviations: Decreased stride length, Shuffle, Narrow base of support       General Gait Details: Pt presents with poor balance, intermittent use of railing on one side and HHA on the other side.  Pt required cues for increased stride length and pacing.  She performed short bout of gt with x 2 standing rest breaks d/t fatigue.   Stairs             Wheelchair Mobility     Tilt Bed    Modified Rankin (Stroke Patients Only)       Balance Overall balance assessment: Needs assistance, History of Falls Sitting-balance support: No upper extremity supported, Feet supported Sitting balance-Leahy Scale: Fair       Standing balance-Leahy Scale: Poor Standing balance comment: Poor stability in standing position with LE fatigue and poor postural awareness, Heavy reliance on couter and rails to stand.  Standing tolerance last ~90 sec.  Communication Communication Communication: No apparent difficulties  Cognition Arousal: Alert Behavior During Therapy: WFL for tasks assessed/performed, Flat affect   PT - Cognitive impairments: No apparent impairments                         Following commands: Intact      Cueing Cueing Techniques: Verbal cues, Tactile cues  Exercises      General Comments        Pertinent Vitals/Pain Pain Assessment Pain Assessment: No/denies pain    Home Living                          Prior Function            PT Goals (current goals can now be found  in the care plan section) Acute Rehab PT Goals Patient Stated Goal: Stop Falling Potential to Achieve Goals: Good Additional Goals Additional Goal #1: Patient will demonstrate improved standing tolerance by participating in > of supported standing activity with no more than supervision assist. Progress towards PT goals: Progressing toward goals (limited d/t dizziness and increased work of breathing.)    Frequency    Min 2X/week      PT Plan      Co-evaluation              AM-PAC PT 6 Clicks Mobility   Outcome Measure  Help needed turning from your back to your side while in a flat bed without using bedrails?: None Help needed moving from lying on your back to sitting on the side of a flat bed without using bedrails?: A Little Help needed moving to and from a bed to a chair (including a wheelchair)?: A Little Help needed standing up from a chair using your arms (e.g., wheelchair or bedside chair)?: A Little Help needed to walk in hospital room?: A Lot Help needed climbing 3-5 steps with a railing? : Total 6 Click Score: 16    End of Session Equipment Utilized During Treatment: Gait belt Activity Tolerance: Patient tolerated treatment well Patient left: in bed;with bed alarm set;with call bell/phone within reach Nurse Communication: Mobility status PT Visit Diagnosis: Unsteadiness on feet (R26.81);Other abnormalities of gait and mobility (R26.89);Muscle weakness (generalized) (M62.81);History of falling (Z91.81)     Time: 1050-1109 PT Time Calculation (min) (ACUTE ONLY): 19 min  Charges:    $Therapeutic Activity: 8-22 mins PT General Charges $$ ACUTE PT VISIT: 1 Visit                     Beulah Brunt , PTA Acute Rehabilitation Services Office [phone]    Sequoyah Counterman Winford Haus 04/09/2024, 11:22 AM

## 2024-04-09 NOTE — Progress Notes (Incomplete)
 HD#5 Subjective:   Summary: This is a 58 year old female with a past medical history of COPD, HFrEF, type 2 diabetes who presented to the emergency department with dyspnea and admitted for acute hypoxic respiratory failure secondary to HFrEF exacerbation and mild COPD.  Overnight Events: No acute events overnight.  This morning examined at bedside.  She endorses intermittent night sweats and pruritus on her arms and lower legs.  No new rashes. Her last bowel movement was 2 days ago, still passing gas.  Objective:  Vital signs in last 24 hours: Vitals:   04/08/24 2040 04/09/24 0500 04/09/24 0520 04/09/24 0743  BP: 112/60  103/72 92/61  Pulse: 77  73 78  Resp: 18  18 16   Temp: 97.9 F (36.6 C)  97.7 F (36.5 C) 98.3 F (36.8 C)  TempSrc: Oral  Oral Oral  SpO2: 97%  100% 96%  Weight:  71.5 kg    Height:       Supplemental O2: Room Air SpO2: 92 %   Physical Exam:   Constitutional: NAD. Eyes: No scleral icterus. Cardiovascular: regular rate and rhythm, no m/r/g Pulmonary/Chest: Clear bilateral lungs. Abdominal: Mild abdominal distention, mildly tender in the right upper quadrant.  Normal bowel sounds.  Filed Weights   04/07/24 0451 04/08/24 0500 04/09/24 0500  Weight: 66.3 kg 66.3 kg 71.5 kg     Intake/Output Summary (Last 24 hours) at 04/09/2024 0945 Last data filed at 04/09/2024 0810 Gross per 24 hour  Intake 480 ml  Output --  Net 480 ml   Net IO Since Admission: -2,276.56 mL [04/09/24 0945]  Pertinent Labs:    Latest Ref Rng & Units 04/07/2024    3:08 AM 04/06/2024    2:45 AM 04/05/2024   10:21 AM  CBC  WBC 4.0 - 10.5 K/uL 9.0  9.1    Hemoglobin 12.0 - 15.0 g/dL 16.1  09.6  04.5    40.9   Hematocrit 36.0 - 46.0 % 45.2  40.0  40.0    39.0   Platelets 150 - 400 K/uL 273  207         Latest Ref Rng & Units 04/09/2024    6:31 AM 04/08/2024    6:11 AM 04/07/2024    3:08 AM  CMP  Glucose 70 - 99 mg/dL 811  914  782   BUN 6 - 20 mg/dL 35  38  31    Creatinine 0.44 - 1.00 mg/dL 9.56  2.13  0.86   Sodium 135 - 145 mmol/L 138  137  136   Potassium 3.5 - 5.1 mmol/L 4.4  4.7  3.8   Chloride 98 - 111 mmol/L 105  97  95   CO2 22 - 32 mmol/L 25  30  27    Calcium  8.9 - 10.3 mg/dL 8.8  9.6  9.2   Total Protein 6.5 - 8.1 g/dL  6.5    Total Bilirubin 0.0 - 1.2 mg/dL  0.6    Alkaline Phos 38 - 126 U/L  101    AST 15 - 41 U/L  19    ALT 0 - 44 U/L  23      Imaging: US  Abdomen Limited RUQ (LIVER/GB) Result Date: 04/09/2024 EXAM: Right Upper Quadrant Abdominal Ultrasound 04/08/2024 11:57:00 PM TECHNIQUE: Real-time ultrasonography of the right upper quadrant of the abdomen was performed. COMPARISON: None available. CLINICAL HISTORY: Jaundice; RUQ pain. FINDINGS: LIVER: The liver demonstrates normal echogenicity. No intrahepatic biliary ductal dilatation. No evidence of mass. BILIARY SYSTEM: Mild  layering gallbladder sludge. No pericholecystic fluid or wall thickening. No cholelithiasis. Negative sonographic Murphy's sign. Common bile duct is within normal limits measuring 5 mm. RIGHT KIDNEY: The right kidney is grossly unremarkable in appearance without evidence of hydronephrosis, echogenic calculi or worrisome mass lesions. PANCREAS: Visualized portions of the pancreas are unremarkable. OTHER: No right upper quadrant ascites. IMPRESSION: 1. Mild layering gallbladder sludge, without associated sonographic findings to suggest acute cholecystitis. Electronically signed by: Zadie Herter MD 04/09/2024 12:03 AM EDT RP Workstation: [id number]    Assessment/Plan:   Principal Problem:   Acute respiratory distress Active Problems:   TOBACCO USER   Essential hypertension, benign   Chronic systolic HF (heart failure) (HCC)   Patient Summary: Stacey Lin is a 58 y.o. female with a past medical history of COPD, HFrEF, type 2 diabetes who presented to the emergency department with concerns of dyspnea and found to have concern for heart failure  exacerbation.  Imaging also revealed potential bronchopneumonia.  Patient was started antibiotics and admitted for further evaluation and management.  #HFrEF exacerbation #Viral pneumonia Respiratory status has improved, satting normal on room air.  She had a good urinary output yesterday, on exam she is clinically euvolemic.  Will discontinue IV Lasix , and start home dose Lasix  40 mg daily.  Her GDMT regimen includes losartan  100 mg, and spironolactone , holding these medicines due to softer blood pressures.   - Discontinue IV Lasix  - Will restart p.o. Lasix  when SCr is stable. - Continue Coreg  3.125 mg twice daily - Continue Farxiga  10 mg daily - SW working on SNF placement.  # Acute kidney injury Prerenal, in the setting of diuresis.  SCr 1.40.  Low concern for postrenal or intrarenal etiology based on labs. - Holding diuresis. - RFP - Avoid nephrotoxic drugs.  # Constipation # Abdominal tenderness # Pruritus Diffuse pruritus, and tenderness in the right abdomen, I query if patient has liver disease.   Will get LFTs, and a right upper quadrant ultrasound.  Abdominal tenderness could also be secondary to constipation, last bowel movement was 2 days ago.  Plan: - Hydroxyzine  25 mg. - MiraLAX  scheduled daily. - Will increase senna to twice daily.  Headaches She is  endorsing poorly localized headache.  Denies neck stiffness, nausea/vomiting or vision changes.  Patient's headache could be secondary to withdrawal from gabapentin .  Per chart review, she has not been getting this medicine while hospitalized.  Ongoing AKI, and GFR of 43, maximum gabapentin  per day 900 mg/day. - Headache cocktail: IV Compazine 5 mg, and benadryl  25mg . - Start gabapentin  300 mg twice daily. - Tylenol  as needed for pain.  #Type 2 diabetes mellitus Blood glucose 130-180. - Continue SSI  #GERD -Continue pantoprazole  40 mg daily  #Hyperlipidemia - Continue Lipitor 40 mg daily  Resolved problems Mild  COPD exacerbation.   Diet: Heart Healthy IVF: None,None VTE: Enoxaparin  Code: DNR PT/OT recs: SNF for Subacute PT  Dispo: Anticipated discharge to Skilled nursing facility in 3 days pending SNF Placement.   Kadarrius Yanke, M.D.  Internal Medicine Resident, PGY-1 [facility]   9:45 AM, 04/09/2024

## 2024-04-09 NOTE — Progress Notes (Signed)
 Mobility Specialist Progress Note:   04/09/24 1300  Mobility  Activity Ambulated with assistance in hallway  Level of Assistance Minimal assist, patient does 75% or more  Assistive Device Other (Comment) (HHA + guard rail)  Distance Ambulated (ft) 80 ft  Activity Response Tolerated fair  Mobility Referral Yes  Mobility visit 1 Mobility  Mobility Specialist Start Time (ACUTE ONLY) 1335  Mobility Specialist Stop Time (ACUTE ONLY) 1343  Mobility Specialist Time Calculation (min) (ACUTE ONLY) 8 min    Pre Mobility:   100% SpO2 During Mobility:   97% SpO2 Post Mobility:    98% SpO2  Pt received in bed and agreeable. Required minA d/t swaying and unsteadiness. C/o lightheadedness and BLE faitgue. VSS throughout on room air. Returned to room w/o fault. Pt left in bed with call bell and all needs met.  D'Vante Nolon Baxter Mobility Specialist Please contact via Special educational needs teacher or Rehab office at [phone]

## 2024-04-09 NOTE — Plan of Care (Signed)
  Problem: Education: Goal: Knowledge of General Education information will improve Description: Including pain rating scale, medication(s)/side effects and non-pharmacologic comfort measures Outcome: Progressing   Problem: Clinical Measurements: Goal: Will remain free from infection Outcome: Progressing Goal: Respiratory complications will improve Outcome: Progressing Goal: Cardiovascular complication will be avoided Outcome: Progressing   Problem: Nutrition: Goal: Adequate nutrition will be maintained Outcome: Progressing   Problem: Coping: Goal: Level of anxiety will decrease Outcome: Progressing   Problem: Pain Managment: Goal: General experience of comfort will improve and/or be controlled Outcome: Progressing

## 2024-04-09 NOTE — TOC Progression Note (Signed)
 Transition of Care [facility]) - Progression Note    Patient Details  Name: Stacey Lin MRN: [id number] Date of Birth: 06-22-1966  Transition of Care [facility]) CM/SW Contact  Katrinka Parr, [place] Phone Number: 04/09/2024, 4:19 PM  Clinical Narrative:     Auth approved through 6/19. Auth# [id number]. Spoke with [facility] liaison; they can admit pt tomorrow.   Expected Discharge Plan: Skilled Nursing Facility Barriers to Discharge: Insurance Authorization  Expected Discharge Plan and Services       Living arrangements for the past 2 months: Skilled Nursing Facility                                       Social Determinants of Health (SDOH) Interventions SDOH Screenings   Food Insecurity: No Food Insecurity (04/05/2024)  Recent Concern: Food Insecurity - Food Insecurity Present (02/28/2024)  Housing: Low Risk  (04/05/2024)  Recent Concern: Housing - High Risk (02/28/2024)  Transportation Needs: No Transportation Needs (04/05/2024)  Recent Concern: Transportation Needs - Unmet Transportation Needs (02/28/2024)  Utilities: Not At Risk (04/05/2024)  Recent Concern: Utilities - At Risk (02/28/2024)  Depression (PHQ2-9): High Risk (10/19/2023)  Financial Resource Strain: High Risk (12/21/2023)  Physical Activity: Unknown (10/18/2023)  Recent Concern: Physical Activity - Inactive (08/02/2023)  Social Connections: Moderately Integrated (04/05/2024)  Stress: Stress Concern Present (10/18/2023)  Tobacco Use: Medium Risk (04/05/2024)    Readmission Risk Interventions    04/13/2022   12:21 PM  Readmission Risk Prevention Plan  Transportation Screening Complete  PCP or Specialist Appt within 3-5 Days Complete  HRI or Home Care Consult Complete  Social Work Consult for Recovery Care Planning/Counseling Complete  Palliative Care Screening Not Applicable  Medication Review Oceanographer) Complete

## 2024-04-10 DIAGNOSIS — L299 Pruritus, unspecified: Secondary | ICD-10-CM | POA: Diagnosis not present

## 2024-04-10 DIAGNOSIS — M5416 Radiculopathy, lumbar region: Secondary | ICD-10-CM | POA: Diagnosis not present

## 2024-04-10 DIAGNOSIS — J129 Viral pneumonia, unspecified: Secondary | ICD-10-CM | POA: Diagnosis not present

## 2024-04-10 DIAGNOSIS — G9341 Metabolic encephalopathy: Secondary | ICD-10-CM | POA: Diagnosis not present

## 2024-04-10 DIAGNOSIS — R7989 Other specified abnormal findings of blood chemistry: Secondary | ICD-10-CM | POA: Diagnosis not present

## 2024-04-10 DIAGNOSIS — N179 Acute kidney failure, unspecified: Secondary | ICD-10-CM | POA: Diagnosis not present

## 2024-04-10 DIAGNOSIS — J449 Chronic obstructive pulmonary disease, unspecified: Secondary | ICD-10-CM | POA: Diagnosis not present

## 2024-04-10 DIAGNOSIS — E1165 Type 2 diabetes mellitus with hyperglycemia: Secondary | ICD-10-CM | POA: Diagnosis not present

## 2024-04-10 DIAGNOSIS — I351 Nonrheumatic aortic (valve) insufficiency: Secondary | ICD-10-CM | POA: Diagnosis not present

## 2024-04-10 DIAGNOSIS — J441 Chronic obstructive pulmonary disease with (acute) exacerbation: Secondary | ICD-10-CM | POA: Diagnosis not present

## 2024-04-10 DIAGNOSIS — J9691 Respiratory failure, unspecified with hypoxia: Secondary | ICD-10-CM | POA: Diagnosis not present

## 2024-04-10 DIAGNOSIS — I509 Heart failure, unspecified: Secondary | ICD-10-CM | POA: Diagnosis not present

## 2024-04-10 DIAGNOSIS — E785 Hyperlipidemia, unspecified: Secondary | ICD-10-CM | POA: Diagnosis not present

## 2024-04-10 DIAGNOSIS — R519 Headache, unspecified: Secondary | ICD-10-CM | POA: Diagnosis not present

## 2024-04-10 DIAGNOSIS — I5023 Acute on chronic systolic (congestive) heart failure: Secondary | ICD-10-CM | POA: Diagnosis not present

## 2024-04-10 LAB — BASIC METABOLIC PANEL WITH GFR
Anion gap: 6 (ref 5–15)
BUN: 29 mg/dL — ABNORMAL HIGH (ref 6–20)
CO2: 22 mmol/L (ref 22–32)
Calcium: 8.9 mg/dL (ref 8.9–10.3)
Chloride: 109 mmol/L (ref 98–111)
Creatinine, Ser: 1.16 mg/dL — ABNORMAL HIGH (ref 0.44–1.00)
GFR, Estimated: 54 mL/min — ABNORMAL LOW (ref >60)
Glucose, Bld: 131 mg/dL — ABNORMAL HIGH (ref 70–99)
Potassium: 4.9 mmol/L (ref 3.5–5.1)
Sodium: 137 mmol/L (ref 135–145)

## 2024-04-10 LAB — GLUCOSE, CAPILLARY
Glucose-Capillary: 120 mg/dL — ABNORMAL HIGH (ref 70–99)
Glucose-Capillary: 147 mg/dL — ABNORMAL HIGH (ref 70–99)

## 2024-04-10 LAB — CBC
HCT: 40 % (ref 36.0–46.0)
Hemoglobin: 12.2 g/dL (ref 12.0–15.0)
MCH: 30.6 pg (ref 26.0–34.0)
MCHC: 30.5 g/dL (ref 30.0–36.0)
MCV: 100.3 fL — ABNORMAL HIGH (ref 80.0–100.0)
Platelets: 225 10*3/uL (ref 150–400)
RBC: 3.99 MIL/uL (ref 3.87–5.11)
RDW: 12.8 % (ref 11.5–15.5)
WBC: 7.4 10*3/uL (ref 4.0–10.5)
nRBC: 0 % (ref 0.0–0.2)

## 2024-04-10 MED ORDER — CLOTRIMAZOLE 1 % EX CREA
1.0000 | TOPICAL_CREAM | Freq: Two times a day (BID) | CUTANEOUS | Status: DC
Start: 2024-04-10 — End: 2024-05-02

## 2024-04-10 MED ORDER — ESCITALOPRAM OXALATE 20 MG PO TABS
20.0000 mg | ORAL_TABLET | Freq: Every day | ORAL | Status: DC
  Administered 2024-04-10: 20 mg via ORAL
  Filled 2024-04-10: qty 1

## 2024-04-10 NOTE — TOC Transition Note (Signed)
 Transition of Care [facility]) - Discharge Note   Patient Details  Name: Stacey Lin MRN: [id number] Date of Birth: 1966/09/26  Transition of Care [facility]) CM/SW Contact:  Katrinka Parr, LCSW Phone Number: 04/10/2024, 2:49 PM   Clinical Narrative:      Per MD patient ready for DC to [facility]. RN, patient, patient's family, and facility notified of DC. Discharge Summary and FL2 sent to facility. RN to call report prior to discharge [phone]). DC packet on chart. Ambulance transport requested for patient.   CSW will sign off for now as social work intervention is no longer needed. Please consult us  again if new needs arise.   Final next level of care: Skilled Nursing Facility Barriers to Discharge: No Barriers Identified   Patient Goals and CMS Choice Patient states their goals for this hospitalization and ongoing recovery are:: To get better and get back home   Choice offered to / list presented to : Adult Children [place] ownership interest in [facility].provided to:: Adult Children    Discharge Placement                       Discharge Plan and Services Additional resources added to the After Visit Summary for                                       Social Drivers of Health (SDOH) Interventions SDOH Screenings   Food Insecurity: No Food Insecurity (04/05/2024)  Recent Concern: Food Insecurity - Food Insecurity Present (02/28/2024)  Housing: Low Risk  (04/05/2024)  Recent Concern: Housing - High Risk (02/28/2024)  Transportation Needs: No Transportation Needs (04/05/2024)  Recent Concern: Transportation Needs - Unmet Transportation Needs (02/28/2024)  Utilities: Not At Risk (04/05/2024)  Recent Concern: Utilities - At Risk (02/28/2024)  Depression (PHQ2-9): High Risk (10/19/2023)  Financial Resource Strain: High Risk (12/21/2023)  Physical Activity: Unknown (10/18/2023)  Recent Concern: Physical Activity - Inactive (08/02/2023)  Social Connections:  Moderately Integrated (04/05/2024)  Stress: Stress Concern Present (10/18/2023)  Tobacco Use: Medium Risk (04/05/2024)     Readmission Risk Interventions    04/13/2022   12:21 PM  Readmission Risk Prevention Plan  Transportation Screening Complete  PCP or Specialist Appt within 3-5 Days Complete  HRI or Home Care Consult Complete  Social Work Consult for Recovery Care Planning/Counseling Complete  Palliative Care Screening Not Applicable  Medication Review Oceanographer) Complete

## 2024-04-10 NOTE — Plan of Care (Signed)
  Problem: Education: Goal: Knowledge of General Education information will improve Description: Including pain rating scale, medication(s)/side effects and non-pharmacologic comfort measures Outcome: Adequate for Discharge   Problem: Health Behavior/Discharge Planning: Goal: Ability to manage health-related needs will improve Outcome: Adequate for Discharge   Problem: Clinical Measurements: Goal: Ability to maintain clinical measurements within normal limits will improve Outcome: Adequate for Discharge Goal: Will remain free from infection Outcome: Adequate for Discharge Goal: Diagnostic test results will improve Outcome: Adequate for Discharge Goal: Respiratory complications will improve Outcome: Adequate for Discharge Goal: Cardiovascular complication will be avoided Outcome: Adequate for Discharge   Problem: Activity: Goal: Risk for activity intolerance will decrease Outcome: Adequate for Discharge   Problem: Nutrition: Goal: Adequate nutrition will be maintained Outcome: Adequate for Discharge   Problem: Coping: Goal: Level of anxiety will decrease Outcome: Adequate for Discharge   Problem: Elimination: Goal: Will not experience complications related to bowel motility Outcome: Adequate for Discharge Goal: Will not experience complications related to urinary retention Outcome: Adequate for Discharge   Problem: Pain Managment: Goal: General experience of comfort will improve and/or be controlled Outcome: Adequate for Discharge   Problem: Safety: Goal: Ability to remain free from injury will improve Outcome: Adequate for Discharge   Problem: Skin Integrity: Goal: Risk for impaired skin integrity will decrease Outcome: Adequate for Discharge   Problem: Activity: Goal: Ability to tolerate increased activity will improve Outcome: Adequate for Discharge   Problem: Clinical Measurements: Goal: Ability to maintain a body temperature in the normal range will  improve Outcome: Adequate for Discharge   Problem: Respiratory: Goal: Ability to maintain adequate ventilation will improve Outcome: Adequate for Discharge Goal: Ability to maintain a clear airway will improve Outcome: Adequate for Discharge   Problem: Education: Goal: Ability to describe self-care measures that may prevent or decrease complications (Diabetes Survival Skills Education) will improve Outcome: Adequate for Discharge Goal: Individualized Educational Video(s) Outcome: Adequate for Discharge   Problem: Coping: Goal: Ability to adjust to condition or change in health will improve Outcome: Adequate for Discharge   Problem: Fluid Volume: Goal: Ability to maintain a balanced intake and output will improve Outcome: Adequate for Discharge   Problem: Health Behavior/Discharge Planning: Goal: Ability to identify and utilize available resources and services will improve Outcome: Adequate for Discharge Goal: Ability to manage health-related needs will improve Outcome: Adequate for Discharge   Problem: Metabolic: Goal: Ability to maintain appropriate glucose levels will improve Outcome: Adequate for Discharge   Problem: Nutritional: Goal: Maintenance of adequate nutrition will improve Outcome: Adequate for Discharge Goal: Progress toward achieving an optimal weight will improve Outcome: Adequate for Discharge   Problem: Skin Integrity: Goal: Risk for impaired skin integrity will decrease Outcome: Adequate for Discharge   Problem: Tissue Perfusion: Goal: Adequacy of tissue perfusion will improve Outcome: Adequate for Discharge

## 2024-04-10 NOTE — Progress Notes (Signed)
 Pt to be discharged to [facility] per MD order. Hand-off report given to [facility]. Pt transported to Discharge Lounge via wheel chair by NT and, to be pick up later by [facility].  AVS and DNR paper give upon discharge.    Skin clean, dry and intact without evidence of skin break down, no evidence of skin tears noted. IV catheter discontinued intact. Site without signs and symptoms of complications.  Pt denies pain.No complaints noted.

## 2024-04-10 NOTE — Discharge Summary (Cosign Needed Addendum)
 Name: Stacey Lin MRN: [id number] DOB: Sep 13, 1966 58 y.o. PCP: Marius Siemens, NP  Date of Admission: 04/05/2024 10:04 AM Date of Discharge: 04/10/2024 Attending Physician: Dr. Broadus Canes  Discharge Diagnosis: Principal Problem:   Acute respiratory distress Active Problems:   TOBACCO USER   Essential hypertension, benign   Chronic systolic HF (heart failure) (HCC)    Discharge Medications: Allergies as of 04/10/2024   No Known Allergies      Medication List     PAUSE taking these medications    spironolactone  25 MG tablet Wait to take this until your doctor or other care provider tells you to start again. Commonly known as: ALDACTONE  TAKE 1 TABLET BY MOUTH ONCE DAILY   valsartan  40 MG tablet Wait to take this until your doctor or other care provider tells you to start again. Commonly known as: Diovan  Take 1 tablet (40 mg total) by mouth daily.       STOP taking these medications    guaiFENesin -codeine  100-10 MG/5ML syrup   predniSONE  20 MG tablet Commonly known as: DELTASONE        TAKE these medications    albuterol  108 (90 Base) MCG/ACT inhaler Commonly known as: VENTOLIN  HFA Inhale 2 puffs into the lungs every 4 (four) hours as needed for wheezing or shortness of breath.   ammonium lactate  12 % lotion Commonly known as: LAC-HYDRIN  Apply 1 Application topically as needed for dry skin.   atorvastatin  40 MG tablet Commonly known as: LIPITOR Take 1 tablet (40 mg total) by mouth daily.   carvedilol  3.125 MG tablet Commonly known as: COREG  Take 1 tablet (3.125 mg total) by mouth 2 (two) times daily with a meal.   clotrimazole  1 % cream Commonly known as: LOTRIMIN  Apply 1 Application topically 2 (two) times daily.   dapagliflozin  propanediol 10 MG Tabs tablet Commonly known as: Farxiga  Take 1 tablet (10 mg total) by mouth daily.   escitalopram  20 MG tablet Commonly known as: LEXAPRO  TAKE 1 TABLET BY MOUTH ONCE DAILY   famotidine  20 MG  tablet Commonly known as: Pepcid  One after supper   furosemide  40 MG tablet Commonly known as: LASIX  Take 1 tablet (40 mg total) by mouth daily.   gabapentin  300 MG capsule Commonly known as: NEURONTIN  TAKE ONE (1) CAPSULE BY MOUTH TWICE DAILY   ipratropium-albuterol  0.5-2.5 (3) MG/3ML Soln Commonly known as: DUONEB Take 3 mLs by nebulization 2 (two) times daily.   metFORMIN  500 MG 24 hr tablet Commonly known as: GLUCOPHAGE -XR Take 1 tablet (500 mg total) by mouth daily.   montelukast  10 MG tablet Commonly known as: SINGULAIR  TAKE 1 TABLET BY MOUTH AT BEDTIME   naloxone  4 MG/0.1ML Liqd nasal spray kit Commonly known as: NARCAN  Inject intranasal for overdose What changed:  how much to take how to take this when to take this reasons to take this additional instructions   pantoprazole  40 MG tablet Commonly known as: Protonix  Take 1 tablet (40 mg total) by mouth daily. Take 30-60 min before first meal of the day What changed: additional instructions   Symbicort  80-4.5 MCG/ACT inhaler Generic drug: budesonide -formoterol  INHALE TWO (2) PUFFS BY MOUTH TWICE DAILY AS NEEDED FOR SHORTNESS OF BREATH AND/OR WHEEZING        Disposition and follow-up:   [name] was discharged from [facility] in Fair condition.  At the hospital follow up visit please address:  1.  Follow-up:  a.  Acute on chronic HFrEF Improved, clinically euvolemic.  She is on  Lasix  40 mg daily.  Weight on discharge 72.5 kg. -Please follow-up on her diuretics regimen. -Continue carvedilol  3.125 mg, and Farxiga  10 mg. -Holding GDMT due to softer blood pressures, evaluated, and if appropriate restart.   b.  Hot flashes Intermittent hot flashes, suspect vasomotor symptoms of menopause.  Could consider evaluation for paroxetine.  c. Uncomplicated intertrigo. Pruritic erythematous papules and plaques under bilateral breasts.  No satellite lesions.  Discharged with clotrimazole   cream. -Please follow-up on the lesions to ensure resolution.   2.  Labs / imaging needed at time of follow-up: CBC, BMP.  3.  Pending labs/ test needing follow-up: None  4.  Medication Changes Abx -   End Date:  Follow-up Appointments:  Contact information for after-discharge care     Destination     [facility]   Service: Skilled Nursing Contact information: [address] [employer] [place]  [phone]                     Hospital Course by problem list: Stacey Lin is a 58 y.o. female with PMH of previous substance use disorder, COPD/Asthma, HTN, HFrEF who presented with shortness of breath and admitted for acute respiratory distress secondary to heart failure exacerbation.   # Acute on chronic HFrEF On presentation, the patient was dyspneic and volume overloaded on exam. BNP was elevated to 920. Chest X-ray demonstrated patchy airspace opacities in both lungs. Transthoracic echocardiogram revealed a left ventricular ejection fraction (LVEF) of 30-35% (previously 40-45% on 10/24), with global hypokinesis and grade 1 diastolic dysfunction. EKG was normal. D-dimer was negative.  Heart failure exacerbation likely in the setting of mild COPD exacerbation, and pneumonia. The patient was treated with IV furosemide  for diuresis. Empiric antibiotics (ceftriaxone  and azithromycin ) were initiated for possible pneumonia; however, antibiotics were discontinued after a low procalcitonin level suggested a low likelihood of bacterial infection. valsartan  and spironolactone , was held due to acute kidney injury (AKI) and borderline low blood pressures.  # AKI Prerenal in setting of overdiuresis.  AKI resolved the day of discharge. - Still holding spironolactone  and valsartan  due to softer blood pressures.   # Constipation # Abdominal tenderness # Pruritus Diffuse pruritus, and tenderness in the right abdomen.  RUQ ultrasound  showed normal biliary system, and biliary sludge without acute cholecystitis.  Normal LFTs on lab.  I think patient's abdominal pain is secondary to constipation, which has improved with MiraLAX  and senna.  Plan: - Hydroxyzine  25 mg. - CW senna and MiraLAX  for constipation.  # Hot flashes Per chart review, hot flashes has been on going since 2019.  I have low suspicion for infection as she is afebrile, with no leukocytosis.  Low suspicion for malignancy, as she has no lymphadenopathy or ongoing weight loss.  I suspect hot flashes are secondary to Vasomotor symptoms of menopause.  Although her last menstrual cycle was about 8 years ago, some patient has vasomotor symptoms persist past 10 years after their last menstrual period. - Follow-up with PCP outpatient for hot flashes.   - Possible treatment regimen includes short course HRT versus paroxetine.  Uncomplicated intertrigo Pruritic erythematous papules and plaques under her breast.  No satellite lesions. -Will treat with clotrimazole  cream. - Encourage general improved skin hygiene and maintaining dry skin.  COPD exacerbation - Controlled with Breo Ellipta , and DuoNebs.   Chronic medical problems GERD: Continue pantoprazole  40 mg daily. Hyperlipidemia: Continue with Lipitor 40 mg daily.   Discharge Subjective: Patient evaluated at bedside  this AM.  Discharge Exam:   BP 102/69 (BP Location: Right Arm)   Pulse 72   Temp (!) 97.5 F (36.4 C) (Axillary)   Resp 17   Ht 5' 1 (1.549 m)   Wt 70.5 kg   LMP  (LMP Unknown)   SpO2 97%   BMI 29.37 kg/m  Constitutional: Lying in bed, NAD. Neck: No JVD elevation. Cardiovascular: regular rate and rhythm, no m/r/g Pulmonary/Chest: Clear bilateral lungs.   Abdominal: Mildly tender in the right abdomen.  Normal bowel sounds. Skin: warm and dry.  No edema. Psych: Mood and affect appropriate.  Pertinent Labs, Studies, and Procedures:     Latest Ref Rng & Units 04/10/2024    6:15 AM  04/07/2024    3:08 AM 04/06/2024    2:45 AM  CBC  WBC 4.0 - 10.5 K/uL 7.4  9.0  9.1   Hemoglobin 12.0 - 15.0 g/dL 57.8  46.9  62.9   Hematocrit 36.0 - 46.0 % 40.0  45.2  40.0   Platelets 150 - 400 K/uL 225  273  207        Latest Ref Rng & Units 04/10/2024    6:15 AM 04/09/2024    6:31 AM 04/08/2024    6:11 AM  CMP  Glucose 70 - 99 mg/dL 528  413  244   BUN 6 - 20 mg/dL 29  35  38   Creatinine 0.44 - 1.00 mg/dL 0.10  2.72  5.36   Sodium 135 - 145 mmol/L 137  138  137   Potassium 3.5 - 5.1 mmol/L 4.9  4.4  4.7   Chloride 98 - 111 mmol/L 109  105  97   CO2 22 - 32 mmol/L 22  25  30    Calcium  8.9 - 10.3 mg/dL 8.9  8.8  9.6   Total Protein 6.5 - 8.1 g/dL   6.5   Total Bilirubin 0.0 - 1.2 mg/dL   0.6   Alkaline Phos 38 - 126 U/L   101   AST 15 - 41 U/L   19   ALT 0 - 44 U/L   23     DG Chest Portable 1 View Result Date: 04/05/2024 CLINICAL DATA:  resp distress EXAM: PORTABLE CHEST - 1 VIEW COMPARISON:  Mar 05, 2024 FINDINGS: Patchy airspace opacities in both lung bases. No pleural effusion or pneumothorax. Mild cardiomegaly. Tortuous aorta with aortic atherosclerosis. No acute fracture or destructive lesion. Multilevel thoracic osteophytosis. IMPRESSION: Patchy airspace opacities in both lung bases, which may represent atelectasis or developing bronchopneumonia, in the correct clinical context. Electronically Signed   By: Rance Burrows M.D.   On: 04/05/2024 11:11     Discharge Instructions: Discharge Instructions     Call MD for:  difficulty breathing, headache or visual disturbances   Complete by: As directed    Call MD for:  persistant dizziness or light-headedness   Complete by: As directed    Call MD for:  persistant nausea and vomiting   Complete by: As directed    Diet - low sodium heart healthy   Complete by: As directed    Discharge instructions   Complete by: As directed    - Hospital follow-up with [facility] on July 2, at 10:30 AM.   Increase  activity slowly   Complete by: As directed        Signed: Marni Sins, MD 04/10/2024, 3:29 PM   Pager: [phone]

## 2024-04-10 NOTE — Discharge Instructions (Addendum)
 You were hospitalized for shortness of breath. Thank you for allowing us  to be part of your care.   We arranged for you to follow up at:  - Please make a follow-up appointment with your PCP.  No new medications.  Please do not take these medications until follow-up with your PCP. - Metformin  500 mg. - Valsartan  40 mg.  Please make sure to to return to the ED if you have worsening shortness of breath, new fevers or worsening lower extremity edema.  Please call our clinic if you have any questions or concerns, we may be able to help and keep you from a long and expensive emergency room wait. Our clinic and after hours phone number is [phone], the best time to call is Monday through Friday 9 am to 4 pm but there is always someone available 24/7 if you have an emergency. If you need medication refills please notify your pharmacy one week in advance and they will send us  a request.

## 2024-04-10 NOTE — Plan of Care (Signed)

## 2024-04-17 ENCOUNTER — Other Ambulatory Visit (HOSPITAL_BASED_OUTPATIENT_CLINIC_OR_DEPARTMENT_OTHER): Admitting: Pharmacist

## 2024-04-17 ENCOUNTER — Encounter: Payer: Self-pay | Admitting: Pharmacist

## 2024-04-17 DIAGNOSIS — Z7984 Long term (current) use of oral hypoglycemic drugs: Secondary | ICD-10-CM

## 2024-04-17 DIAGNOSIS — E785 Hyperlipidemia, unspecified: Secondary | ICD-10-CM

## 2024-04-17 DIAGNOSIS — E119 Type 2 diabetes mellitus without complications: Secondary | ICD-10-CM

## 2024-04-17 NOTE — Progress Notes (Signed)
 Pharmacy Quality Measure Review  This patient is appearing on a report for being at risk of failing the adherence measure for cholesterol (statin) and diabetes medications this calendar year.   Medication: atorvastatin  Last fill date: 04/08/2024 for 30 day supply  Medication: metformin  Last fill date: 04/08/2024 for 30 day supply  Refills appear to be up to date. Reminders set to call [facility] when next fills are due.   Herlene Fleeta Morris, PharmD, JAQUELINE, CPP Clinical Pharmacist [facility] [phone]

## 2024-04-18 DIAGNOSIS — K5900 Constipation, unspecified: Secondary | ICD-10-CM | POA: Diagnosis present

## 2024-04-18 DIAGNOSIS — N951 Menopausal and female climacteric states: Secondary | ICD-10-CM | POA: Diagnosis present

## 2024-04-18 DIAGNOSIS — R031 Nonspecific low blood-pressure reading: Secondary | ICD-10-CM | POA: Insufficient documentation

## 2024-04-18 DIAGNOSIS — L304 Erythema intertrigo: Secondary | ICD-10-CM | POA: Diagnosis present

## 2024-04-24 ENCOUNTER — Encounter: Admitting: Student

## 2024-04-24 NOTE — Progress Notes (Deleted)
   Established Patient Office Visit  Subjective   Patient ID: Genavieve Satz, female    DOB: 05/24/1966  Age: 58 y.o. MRN: [id number]  No chief complaint on file.   HPI  This is a 58 year old female past medical history of COPD, chronic systolic heart failure with EF of 30 to 35% in May 2025, hyperlipidemia, previous substance use disorder who was admitted for acute respiratory distress, HFrEF exacerbation, community-acquired pneumonia.  Patient was admitted on 04/05/2024 Patient discharged on 04/10/2024   {History (Optional):23778}  ROS    Objective:     LMP  (LMP Unknown)  BP Readings from Last 3 Encounters:  04/10/24 102/69  03/05/24 114/80  03/03/24 120/83   Wt Readings from Last 3 Encounters:  04/10/24 155 lb 6.8 oz (70.5 kg)  03/05/24 142 lb 9.6 oz (64.7 kg)  03/03/24 137 lb 5.6 oz (62.3 kg)      Physical Exam   No results found for any visits on 04/24/24.  {Labs (Optional):23779}  The 10-year ASCVD risk score (Arnett DK, et al., 2019) is: 6.3%    Assessment & Plan:  Acute on chronic HFrEF -Discharge weight 72.5 kg - Discharge medications Lasix  40 mg daily, Coreg  3.125 mg twice daily, Farxiga  10 mg daily  AKI -Valsartan  spironolactone  held  Uncomplicated intertrigo Discharged with clotrimazole  cream  Hot flashes 2/2 vasomotor symptoms of menopause - Paroxetine  COPD exacerbation - Breo Ellipta  and DuoNebs  GERD -Continue pantoprazole   Hyperlipidemia -Continue Lipitor 40 mg daily  Type 2 diabetes -Metformin  5 mg daily  Problem List Items Addressed This Visit   None   No follow-ups on file.    Toma Edwards, DO

## 2024-05-01 ENCOUNTER — Other Ambulatory Visit (HOSPITAL_COMMUNITY): Payer: Self-pay | Admitting: Internal Medicine

## 2024-05-02 ENCOUNTER — Ambulatory Visit (HOSPITAL_COMMUNITY)
Admission: RE | Admit: 2024-05-02 | Discharge: 2024-05-02 | Disposition: A | Discharge status: Home / Self Care | Source: Ambulatory Visit | Attending: Adult Health | Admitting: Adult Health

## 2024-05-02 ENCOUNTER — Other Ambulatory Visit (HOSPITAL_COMMUNITY): Payer: Self-pay

## 2024-05-02 ENCOUNTER — Other Ambulatory Visit: Payer: Self-pay

## 2024-05-02 VITALS — BP 160/100 | HR 100 | Wt 156.4 lb

## 2024-05-02 DIAGNOSIS — F17210 Nicotine dependence, cigarettes, uncomplicated: Secondary | ICD-10-CM | POA: Insufficient documentation

## 2024-05-02 DIAGNOSIS — F1410 Cocaine abuse, uncomplicated: Secondary | ICD-10-CM | POA: Insufficient documentation

## 2024-05-02 DIAGNOSIS — I10 Essential (primary) hypertension: Secondary | ICD-10-CM

## 2024-05-02 DIAGNOSIS — T486X6A Underdosing of antiasthmatics, initial encounter: Secondary | ICD-10-CM | POA: Diagnosis not present

## 2024-05-02 DIAGNOSIS — I110 Hypertensive heart disease with heart failure: Secondary | ICD-10-CM | POA: Diagnosis not present

## 2024-05-02 DIAGNOSIS — Z720 Tobacco use: Secondary | ICD-10-CM

## 2024-05-02 DIAGNOSIS — Z7984 Long term (current) use of oral hypoglycemic drugs: Secondary | ICD-10-CM | POA: Diagnosis not present

## 2024-05-02 DIAGNOSIS — T447X6A Underdosing of beta-adrenoreceptor antagonists, initial encounter: Secondary | ICD-10-CM | POA: Insufficient documentation

## 2024-05-02 DIAGNOSIS — F1910 Other psychoactive substance abuse, uncomplicated: Secondary | ICD-10-CM

## 2024-05-02 DIAGNOSIS — I340 Nonrheumatic mitral (valve) insufficiency: Secondary | ICD-10-CM | POA: Diagnosis not present

## 2024-05-02 DIAGNOSIS — I5022 Chronic systolic (congestive) heart failure: Secondary | ICD-10-CM | POA: Diagnosis not present

## 2024-05-02 DIAGNOSIS — T43226A Underdosing of selective serotonin reuptake inhibitors, initial encounter: Secondary | ICD-10-CM | POA: Insufficient documentation

## 2024-05-02 DIAGNOSIS — Z91128 Patient's intentional underdosing of medication regimen for other reason: Secondary | ICD-10-CM | POA: Insufficient documentation

## 2024-05-02 DIAGNOSIS — D649 Anemia, unspecified: Secondary | ICD-10-CM | POA: Diagnosis not present

## 2024-05-02 DIAGNOSIS — J449 Chronic obstructive pulmonary disease, unspecified: Secondary | ICD-10-CM | POA: Insufficient documentation

## 2024-05-02 DIAGNOSIS — I5042 Chronic combined systolic (congestive) and diastolic (congestive) heart failure: Secondary | ICD-10-CM | POA: Insufficient documentation

## 2024-05-02 DIAGNOSIS — T501X6A Underdosing of loop [high-ceiling] diuretics, initial encounter: Secondary | ICD-10-CM | POA: Diagnosis not present

## 2024-05-02 DIAGNOSIS — T383X6A Underdosing of insulin and oral hypoglycemic [antidiabetic] drugs, initial encounter: Secondary | ICD-10-CM | POA: Diagnosis not present

## 2024-05-02 DIAGNOSIS — I428 Other cardiomyopathies: Secondary | ICD-10-CM | POA: Insufficient documentation

## 2024-05-02 MED ORDER — FUROSEMIDE 40 MG PO TABS
40.0000 mg | ORAL_TABLET | Freq: Every day | ORAL | 3 refills | Status: DC
  Filled 2024-05-02: qty 30, 30d supply, fill #0

## 2024-05-02 MED ORDER — DAPAGLIFLOZIN PROPANEDIOL 10 MG PO TABS
10.0000 mg | ORAL_TABLET | Freq: Every day | ORAL | 6 refills | Status: DC
  Filled 2024-05-02: qty 30, 30d supply, fill #0

## 2024-05-02 MED ORDER — CARVEDILOL 3.125 MG PO TABS
3.1250 mg | ORAL_TABLET | Freq: Two times a day (BID) | ORAL | 3 refills | Status: DC
  Filled 2024-05-02: qty 60, 30d supply, fill #0

## 2024-05-02 NOTE — Progress Notes (Signed)
 ReDS Vest / Clip - 05/02/24 1100       ReDS Vest / Clip   Station Marker A    Ruler Value 30    ReDS Value Range Low volume    ReDS Actual Value 34

## 2024-05-02 NOTE — Patient Instructions (Signed)
 Medication Changes:  RESTART:  Carvedilol  3.125 mg Twice daily  Farxiga  10 mg Daily Furosemide  40 mg Daily  Referrals:  You have been referred to our [facility], they will call you to schedule a home visit  Special Instructions // Education:  Do the following things EVERYDAY: Weigh yourself in the morning before breakfast. Write it down and keep it in a log. Take your medicines as prescribed Eat low salt foods--Limit salt (sodium) to 2000 mg per day.  Stay as active as you can everyday Limit all fluids for the day to less than 2 liters   Follow-Up in:   Please follow up with our heart failure pharmacist in 2 weeks  Your physician recommends that you schedule a follow-up appointment in: 4 weeks    At the Advanced [facility] Failure Clinic, you and your health needs are our priority. We have a designated team specialized in the treatment of Heart Failure. This Care Team includes your primary Heart Failure Specialized Cardiologist (physician), Advanced Practice Providers (APPs- Physician Assistants and Nurse Practitioners), and Pharmacist who all work together to provide you with the care you need, when you need it.   You may see any of the following providers on your designated Care Team at your next follow up:  Dr. Toribio Fuel Dr. Ezra Shuck Dr. Ria Commander Dr. Odis Brownie Greig Mosses, NP Caffie Shed, [place] [facility] [place], [place] Beckey Coe, NP [place] Lee, NP Tinnie Redman, PharmD   Please be sure to bring in all your medications bottles to every appointment.   Need to Contact Us :  If you have any questions or concerns before your next appointment please send us  a message through [place] or call our office at [phone].    TO LEAVE A MESSAGE FOR THE NURSE SELECT OPTION 2, PLEASE LEAVE A MESSAGE INCLUDING: YOUR NAME DATE OF BIRTH CALL BACK NUMBER REASON FOR CALL**this is important as we prioritize the call backs  YOU  WILL RECEIVE A CALL BACK THE SAME DAY AS LONG AS YOU CALL BEFORE 4:00 PM

## 2024-05-02 NOTE — Progress Notes (Signed)
 Patient ID: Stacey Lin, female   DOB: 1966-07-29, 59 y.o.   MRN: [id number]    Advanced Heart Failure Clinic Note   PCP: Dr Kristy  Pulmonologist: Dr. Corrie HF: Dr. 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  EF as low as 30-35% in 2014.   cMRI 08/27/13: EF 32% RV mildly dilated, no scar or infiltrative disease. Mod MR and Mod-sev TR  R/LHC (06/06/14): ectatic coronaries (suspect d/t HTN) no significant arthrosclerosis and normal EF. No significant MR noted  Echo (05/2014): EF 55-60%, mild/mod AI, grade I DD and mild MR  EF stable at 55-60% in 2019.   She was admitted in 06/23 with acute respiratory failure with hypoxia 2/2 acute on chronic systolic CHF. EF down to 30-35% on echo with normal RV, severe BAE, moderate to severe MR, moderate to severe TR and moderate to severe AI. Diuresed and GDMT titrated. R/LHC with no CAD and reduced Fick CI of 1.96 L/min/m2.  Readmit 10/24 with COPD exacerbation in setting of noncompliance with medications and ongoing tobacco use + recent cocaine  use. Repeat echo with EF 40-45%.  Admitted 01/25 with acute respiratory failure 2/2 COPD exacerbation and influenza A. She also had acute on chronic CHF and diuresed with IV lasix .  Admitted in 03/2024 with pneumonia. Echo down 30-35%. Mild MR/ mild AS. She was not discharged on spiro due to elevated potassium.   Today she returns for HF follow up. Complaining of shortness of breath. Denies PND/Orthopnea. Appetite ok. No fever or chills. She has not been taking lasix , coreg , farxiga , duoneb, or lexapro . She said she moved to her sons house to get out of a bad living situation and lost some of her pill packs. She did not take any medications this morning. Says she has not used cocaine  in 2 months. Lives with her son.   FH:  Mother deceased: Dementia, seizures, stomach cancer        Father deceased: HTN  Review of systems complete and found to be negative unless listed in HPI.    Past Medical History:  Diagnosis Date   Anemia    Arrhythmia    Asthma    CHF (congestive heart failure) (HCC)    1990s   Chronic headaches    COPD (chronic obstructive pulmonary disease) (HCC)    ??   Hypercholesteremia    Hypertension     Current Outpatient Medications  Medication Sig Dispense Refill   albuterol  (VENTOLIN  HFA) 108 (90 Base) MCG/ACT inhaler Inhale 2 puffs into the lungs every 4 (four) hours as needed for wheezing or shortness of breath. 17 g 1   ammonium lactate  (LAC-HYDRIN ) 12 % lotion Apply 1 Application topically as needed for dry skin. 400 g 5   atorvastatin  (LIPITOR) 40 MG tablet Take 1 tablet (40 mg total) by mouth daily. 90 tablet 1   ipratropium-albuterol  (DUONEB) 0.5-2.5 (3) MG/3ML SOLN Take 3 mLs by nebulization 2 (two) times daily. 180 mL 1   metFORMIN  (GLUCOPHAGE -XR) 500 MG 24 hr tablet Take 1 tablet (500 mg total) by mouth daily. 90 tablet 1   naloxone  (NARCAN ) nasal spray 4 mg/0.1 mL Inject intranasal for overdose 1 each 1   SYMBICORT  80-4.5 MCG/ACT inhaler INHALE TWO (2) PUFFS BY MOUTH TWICE DAILY AS NEEDED FOR SHORTNESS OF BREATH AND/OR WHEEZING 10.2 g 10   valsartan  (DIOVAN )  40 MG tablet Take 1 tablet (40 mg total) by mouth daily. 30 tablet 11   carvedilol  (COREG ) 3.125 MG tablet TAKE 1 TABLET(3.125 MG) BY MOUTH TWICE DAILY WITH A MEAL (Patient not taking: Reported on 05/02/2024) 90 tablet 1   dapagliflozin  propanediol (FARXIGA ) 10 MG TABS tablet Take 1 tablet (10 mg total) by mouth daily. (Patient not taking: Reported on 05/02/2024) 60 tablet 1   escitalopram  (LEXAPRO ) 20 MG tablet TAKE 1 TABLET BY MOUTH ONCE DAILY (Patient not taking: Reported on 05/02/2024) 90 tablet 0   famotidine  (PEPCID ) 20 MG tablet One after supper (Patient not taking: Reported on 05/02/2024) 30 tablet 11   furosemide   (LASIX ) 40 MG tablet Take 1 tablet (40 mg total) by mouth daily. (Patient not taking: Reported on 05/02/2024) 60 tablet 1   gabapentin  (NEURONTIN ) 300 MG capsule TAKE ONE (1) CAPSULE BY MOUTH TWICE DAILY (Patient not taking: Reported on 05/02/2024) 60 capsule 10   montelukast  (SINGULAIR ) 10 MG tablet TAKE 1 TABLET BY MOUTH AT BEDTIME (Patient not taking: Reported on 05/02/2024) 30 tablet 10   pantoprazole  (PROTONIX ) 40 MG tablet Take 1 tablet (40 mg total) by mouth daily. Take 30-60 min before first meal of the day (Patient not taking: Reported on 05/02/2024) 30 tablet 2   [Paused] spironolactone  (ALDACTONE ) 25 MG tablet TAKE 1 TABLET BY MOUTH ONCE DAILY (Patient not taking: Reported on 05/02/2024) 30 tablet 10   No current facility-administered medications for this encounter.   Vitals:   05/02/24 1146  BP: (!) 160/100  Pulse: 100  SpO2: 100%  Weight: 70.9 kg (156 lb 6.4 oz)     Wt Readings from Last 3 Encounters:  05/02/24 70.9 kg (156 lb 6.4 oz)  04/10/24 70.5 kg (155 lb 6.8 oz)  03/05/24 64.7 kg (142 lb 9.6 oz)     PHYSICAL EXAM: General:   No resp difficulty Neck: supple. JVP 8-9  Cor: PMI nondisplaced. Regular rate & rhythm. No rubs, gallops or murmurs. Lungs: clear Abdomen: soft, nontender, nondistended.  Extremities: no cyanosis, clubbing, rash, edema Neuro: alert & oriented x3  ASSESSMENT & PLAN:  1) Chronic systolic HF:   - Nonischemic cardiomyopathy.  - EF 30-35% in 2014 - Cath 8/15 showed normal cors  - Echo 6/19 EF 55-65%  - Echo 06/23: EF down to 30-35% on echo with normal RV, severe BAE, moderate to severe MR, moderate to severe TR and moderate to severe AI - Echo 10/24: EF 40-45%, RV okay, PASP 65 mmHg, moderate to severe MR, mild to moderate TR, moderate AI -Echo 02/2024: LVEF 30-35%.  - NYHA III.  Volume status mildly elevated on exam. Restart lasix  40 mg daily. ReDs reading: 34 %, normal.  - Hold spiro due to recent hyperkalemia.  - Continue valsartan  40 mg  daily  - Restart Farxiga  10 mg daily - Restart coreg  3.125 mg BID - Check BMET  -Repeat Echo 3-4 months after HF meds optimized.   2) Valvular hear disease -2025 Mild MR  3) HTN:  -Elevated. Reinforced medication compliance.  - Continue valsartan  .  -Restart coreg .  - Hold off on spironolactone .   4) Substance abuse Last UDS + cocaine   02/2024. Denies recent drug use.   5) Tobacco Abuse -Continues to smoke. Cessation advised  SDOH: Now living with her son.   Follow up in 2 weeks with pharmacy and 4 weeks with APP Refer to HF Paramedicine.  She is at high risk for readmit due to poor insight.    Kelani Robart  Marlise Fahr NP-C  11:56 AM

## 2024-05-03 ENCOUNTER — Telehealth (HOSPITAL_COMMUNITY): Payer: Self-pay | Admitting: Emergency Medicine

## 2024-05-03 NOTE — Telephone Encounter (Signed)
 Spoke with Stacey Lin and introduced myself and scheduled initial paramedicine home visit 7/16 @ 11:30.  She advises she needs a pill box and a scale.  I will get that from the HF Clinic to take to first visit.    Mary Sharps, EMT-Paramedic [phone] 05/03/2024

## 2024-05-06 ENCOUNTER — Telehealth (HOSPITAL_BASED_OUTPATIENT_CLINIC_OR_DEPARTMENT_OTHER): Payer: Self-pay | Admitting: Licensed Clinical Social Worker

## 2024-05-06 NOTE — Telephone Encounter (Signed)
 H&V Care Navigation CSW Progress Note  Clinical Social Worker contacted patient by phone to f/u on referral for paramedicine. Dede, [place] paramedic has first appt scheduled this week. Left voicemail at [phone] to complete SDOH assessment. Will re-attempt again as able if not return call today.  Patient is participating in a Managed Medicaid Plan:  No, UHC Dual Complete  SDOH Screenings   Food Insecurity: No Food Insecurity (04/05/2024)  Recent Concern: Food Insecurity - Food Insecurity Present (02/28/2024)  Housing: Low Risk  (04/05/2024)  Recent Concern: Housing - High Risk (02/28/2024)  Transportation Needs: No Transportation Needs (04/05/2024)  Recent Concern: Transportation Needs - Unmet Transportation Needs (02/28/2024)  Utilities: Not At Risk (04/05/2024)  Recent Concern: Utilities - At Risk (02/28/2024)  Depression (PHQ2-9): High Risk (10/19/2023)  Financial Resource Strain: High Risk (12/21/2023)  Physical Activity: Unknown (10/18/2023)  Recent Concern: Physical Activity - Inactive (08/02/2023)  Social Connections: Moderately Integrated (04/05/2024)  Stress: Stress Concern Present (10/18/2023)  Tobacco Use: Medium Risk (04/17/2024)    Marit Lark, MSW, LCSW Clinical Social Worker II [facility] Heart/Vascular Care Navigation  [phone]- work cell phone (preferred)

## 2024-05-06 NOTE — Progress Notes (Signed)
 Heart and Vascular Care Navigation  05/06/2024  Stacey Lin 1965-12-28 [id number]  Reason for Referral: paramedicine SDOH screening Patient is participating in a Managed Medicaid Plan: No, UHC Dual Complete  Engaged with patient by telephone for initial visit for Heart and Vascular Care Coordination.                                                                                                   Assessment: LCSW received a call back from pt after leaving a voicemail this morning, she requests more clarity as to why she was referred to paramedicine team, I shared why the referral was made and a brief overview of support Dede can provide. Confirmed home address, PCP, and emergency contacts. She is close with her children and her aunt who has been her main support since her mother passed 20 years ago. She has been frustrated by her recurrent hospital visits and is very interested in the support with medications especially paramedicine can provide her. At this time she has an aide 7 days a week through [employer], they usually come in the morning unless her adult children are around to assist her then she has them come in the afternoon. No additional home health services. No concerns noted regarding food, utilities, etc today. She is aware she can call me as needed and ask Dede to reach out to me as well, she has previously spoken with Jenna, aware she will be back from leave in August.             Paramedicine Initial Assessment:   Housing:  In what kind of housing do you live? House/apt/trailer/shelter? - single family home   Do you rent/pay a mortgage/own? - son and daughter in law pay rent (may be mortgage) over $1800/mo   Do you live with anyone? - son and daughter in Social worker (he is an Pharmacist, hospital, she is a Runner, broadcasting/film/video)   Are you currently worried about losing your housing? - no, she feels supported by children, hopes to contribute to housing costs   Social:  What is your current  marital status?- divorced for about a year   Do you have any children? - two - Penne (son), Edsel (daughter)   Income:  What is your current source of income? - social security disability, roughly $16   Insurance:  Are you currently insured?  - [employer] and Medicaid Dual Complete   Do you have prescription coverage? - yes, $0 copays  Transportation:  Do you have transportation to your medical appointments? - Family, friends, and UHC transportation benefit     Daily Health Needs: Do you have a working scale at home? no, Dede to bring How do you manage your medications at home? Takes them from bottles, previously was using [employer] packaging of some kind   Do you have issues affording your medications? No    Do you have a PCP? - yes, [facility], [name] Bohr, NP   Do you have any trouble reading or writing? -if the text is too small it can be difficult for her to  see, she is interested in an eye appt   Do you currently use tobacco products or have recently quit? - has quit substances entirely for 2 months (hx of cocaine /crack cocaine ), committed to sobriety and interested in resources to help with this that are virtual   Are there any additional barriers you see to getting the care you need?  - at this time feels well supported, is hopeful for ongoing support and to be able to take care of herself with the support of family                    HRT/VAS Care Coordination     Patients Home Cardiology Office Heart Failure Clinic   Outpatient Care Team Social Worker   Social Worker Name: Marit Lark, [place], [phone]   Living arrangements for the past 2 months Single Family Home   Lives with: Adult Children   Patient Current Insurance Coverage Managed Medicare; Medicaid   Patient Has Concern With Paying Medical Bills No   Does Patient Have Prescription Coverage? Yes   Home Assistive Devices/Equipment Nebulizer   DME Agency [employer]   [facility] Agency [facility]   Current home services DME  walker, cane,       Social History:                                                                             SDOH Screenings   Food Insecurity: No Food Insecurity (05/06/2024)  Recent Concern: Food Insecurity - Food Insecurity Present (02/28/2024)  Housing: High Risk (05/06/2024)  Transportation Needs: No Transportation Needs (05/06/2024)  Recent Concern: Transportation Needs - Unmet Transportation Needs (02/28/2024)  Utilities: Not At Risk (05/06/2024)  Recent Concern: Utilities - At Risk (02/28/2024)  Depression (PHQ2-9): High Risk (10/19/2023)  Financial Resource Strain: Low Risk  (05/06/2024)  Physical Activity: Unknown (10/18/2023)  Recent Concern: Physical Activity - Inactive (08/02/2023)  Social Connections: Moderately Integrated (04/05/2024)  Stress: Stress Concern Present (05/06/2024)  Tobacco Use: Medium Risk (04/17/2024)  Health Literacy: Adequate Health Literacy (05/06/2024)    SDOH Interventions: Financial Resources:  Financial Strain Interventions: Intervention Not Indicated  Food Insecurity:  Food Insecurity Interventions: Intervention Not Indicated  Housing Insecurity:  Housing Interventions: Intervention Not Indicated  Transportation:   Transportation Interventions: Patient Resources (Friends/Family), Payor Benefit, Intervention Not Indicated     Other Care Navigation Interventions:     Provided Pharmacy assistance resources  Pt shares there has been some challenges since hospital/SNF with organizing her medications/making sure she has them all. She will make sure she has them all when Dede visits  Patient expressed Mental Health concerns No. Shares she is in a good place right now with good support would like to participate in groups to maintain sobriety   Follow-up plan:   LCSW will connect Dede with information about substance use support, and online NA meetings. Encouraged her to have all  medications ready for Dede to review, no additional questions at this time.

## 2024-05-08 ENCOUNTER — Other Ambulatory Visit (HOSPITAL_COMMUNITY): Payer: Self-pay | Admitting: Emergency Medicine

## 2024-05-08 ENCOUNTER — Other Ambulatory Visit (HOSPITAL_BASED_OUTPATIENT_CLINIC_OR_DEPARTMENT_OTHER): Admitting: Pharmacist

## 2024-05-08 DIAGNOSIS — Z7984 Long term (current) use of oral hypoglycemic drugs: Secondary | ICD-10-CM

## 2024-05-08 DIAGNOSIS — E119 Type 2 diabetes mellitus without complications: Secondary | ICD-10-CM

## 2024-05-08 DIAGNOSIS — E785 Hyperlipidemia, unspecified: Secondary | ICD-10-CM

## 2024-05-08 NOTE — Progress Notes (Signed)
 Pharmacy Quality Measure Review  This patient is appearing on a report for being at risk of failing the adherence measure for cholesterol (statin) and diabetes medications this calendar year.   Medication: atorvastatin  Last fill date: 05/06/2024 for 30 day supply  Medication: dapagliflozin   Last fill date: 05/03/2024 for 30 day supply  Medication: metformin  Last fill date: 05/06/2024 for 30 day supply  Medication: valsartan  Last fill date: 04/08/2024 for 30 day supply. Of note, she was admitted 6/23 with acute resp failure, CHF exacerbation. Valsartan  was held d/t AKI but was restarted by CHF clinic on 05/02/2024. Collaborated with her pharmacy to authorize refills.   Refills appear to be up to date. Reminders set to call [facility] when next fills are due.   Herlene Fleeta Morris, PharmD, JAQUELINE, CPP Clinical Pharmacist [facility] [phone]

## 2024-05-08 NOTE — Progress Notes (Signed)
 SOCIAL/MEDICAL BARRIERS:  PHARMACY USED    MED ISSUES:  AFFORDABILITY No issues per pt.  PT ASSIST APPS NEEDED NONE  PCP [name], MD   INSURANCE UHC, medicare/medicaid  SOURCE OF INCOME disability  TRANSPORTATION UHC  FOOD INSECURITIES/NEEDS FOOD STAMPS  REVIEWED DIET/FLUID/SALT RESTRICTIONS YES  RENT/OWN HOME ISSUES   SOCIAL SUPPORT Son & Daughter  SAFETY/DOMESTIC ISSUES NONE  SUBSTANCE ABUSE YES-Crack  DAILY WEIGHTS Scale and weight chart provided  EDUCATE ON DISEASE PROCESS/SYMPTOMS/PURPOSES OF MEDS YES

## 2024-05-15 ENCOUNTER — Emergency Department (HOSPITAL_COMMUNITY): Admission: EM | Admit: 2024-05-15 | Discharge: 2024-05-15 | Disposition: A | Discharge status: Home / Self Care

## 2024-05-15 ENCOUNTER — Other Ambulatory Visit (HOSPITAL_COMMUNITY): Payer: Self-pay | Admitting: Emergency Medicine

## 2024-05-15 ENCOUNTER — Emergency Department (HOSPITAL_COMMUNITY)

## 2024-05-15 DIAGNOSIS — J449 Chronic obstructive pulmonary disease, unspecified: Secondary | ICD-10-CM | POA: Insufficient documentation

## 2024-05-15 DIAGNOSIS — R0689 Other abnormalities of breathing: Secondary | ICD-10-CM | POA: Diagnosis not present

## 2024-05-15 DIAGNOSIS — I700 Atherosclerosis of aorta: Secondary | ICD-10-CM | POA: Diagnosis not present

## 2024-05-15 DIAGNOSIS — R0602 Shortness of breath: Secondary | ICD-10-CM | POA: Insufficient documentation

## 2024-05-15 DIAGNOSIS — R0600 Dyspnea, unspecified: Secondary | ICD-10-CM | POA: Diagnosis not present

## 2024-05-15 DIAGNOSIS — Z7951 Long term (current) use of inhaled steroids: Secondary | ICD-10-CM | POA: Diagnosis not present

## 2024-05-15 DIAGNOSIS — R069 Unspecified abnormalities of breathing: Secondary | ICD-10-CM | POA: Diagnosis not present

## 2024-05-15 DIAGNOSIS — I517 Cardiomegaly: Secondary | ICD-10-CM | POA: Diagnosis not present

## 2024-05-15 LAB — CBC
HCT: 42.8 % (ref 36.0–46.0)
Hemoglobin: 13.6 g/dL (ref 12.0–15.0)
MCH: 31 pg (ref 26.0–34.0)
MCHC: 31.8 g/dL (ref 30.0–36.0)
MCV: 97.5 fL (ref 80.0–100.0)
Platelets: 285 K/uL (ref 150–400)
RBC: 4.39 MIL/uL (ref 3.87–5.11)
RDW: 12.6 % (ref 11.5–15.5)
WBC: 9.8 K/uL (ref 4.0–10.5)
nRBC: 0 % (ref 0.0–0.2)

## 2024-05-15 LAB — BASIC METABOLIC PANEL WITH GFR
Anion gap: 10 (ref 5–15)
BUN: 22 mg/dL — ABNORMAL HIGH (ref 6–20)
CO2: 25 mmol/L (ref 22–32)
Calcium: 9.2 mg/dL (ref 8.9–10.3)
Chloride: 104 mmol/L (ref 98–111)
Creatinine, Ser: 1.22 mg/dL — ABNORMAL HIGH (ref 0.44–1.00)
GFR, Estimated: 51 mL/min — ABNORMAL LOW (ref >60)
Glucose, Bld: 131 mg/dL — ABNORMAL HIGH (ref 70–99)
Potassium: 3.6 mmol/L (ref 3.5–5.1)
Sodium: 139 mmol/L (ref 135–145)

## 2024-05-15 LAB — BRAIN NATRIURETIC PEPTIDE: B Natriuretic Peptide: 83.3 pg/mL (ref 0.0–100.0)

## 2024-05-15 LAB — TROPONIN I (HIGH SENSITIVITY)
Troponin I (High Sensitivity): 16 ng/L (ref <18)
Troponin I (High Sensitivity): 17 ng/L (ref <18)

## 2024-05-15 NOTE — ED Provider Notes (Signed)
 58 yo female here with SOB, hx of CHF on 2-3L [place] at baseline Mild nonspecific chest pain - 1st trop no significant issues, pending 2nd trop   Physical Exam  BP 124/62   Pulse 71   Temp 98.5 F (36.9 C) (Oral)   Resp 17   LMP  (LMP Unknown)   SpO2 97%   Physical Exam  Procedures  Procedures  ED Course / MDM   Clinical Course as of 05/15/24 1653  Wed May 15, 2024  1247 Weight today 69.2  [TL]    Clinical Course User Index [TL] Simon Lavonia SAILOR, MD   Medical Decision Making Amount and/or Complexity of Data Reviewed Labs: ordered. Radiology: ordered.   Pt reassessed. Labs normal.  Feels fine. Lungs CTAB.  Vitals normal.  Okay for discharge       Cottie Donnice PARAS, MD 05/15/24 [phone]

## 2024-05-15 NOTE — Discharge Instructions (Addendum)
 Continue with all your medications including your Lasix . Your workup today did not show any kind of acute pathology.   If anything changes such as worsening chest pain or shortness of breath then please come back to the ED for further evaluation.

## 2024-05-15 NOTE — ED Notes (Signed)
 Patient transported to X-ray

## 2024-05-15 NOTE — ED Notes (Signed)
 Patient provided dc papers by Mardeen PEAK. Patient receives papers afte IV removal by NT Sophia.

## 2024-05-15 NOTE — Progress Notes (Signed)
 On arrival to today's home visit. Ms. Kuna answered the door and appeared in obvious respiratory distress.  She states she does not feel well today.  She denies active chest pain but does say she's had some mild chest pain that comes and goes w/o provocation.   She has inspiratory and expiratory wheezing bilat and slightly diminished in the bases.  No nausea or vomiting.  She has not taken this mornings meds yet which includes a duo neb.  Administered albuterol  and atrovent  via neb mask w/ O2 @ 8lpm.  Lung sounds improved but did not resolve.  Respiratory rate decreased to 20bpm.   NSR on monitor.  12-lead unremarakable  for ST elevation or depression.   Pt w/ episode of a non-productive cough.  She is afebrile.  She denies drug use of any kind.  Has been compliant w/ her meds.  EMS arrived and initially pt did not desire transport then stated she wished to be further evaluated. Pt transported non-emergency to [facility]. Pt pill box reconciled x 1 week.    Mary Sharps, EMT-Paramedic [phone] 05/15/2024

## 2024-05-15 NOTE — ED Provider Notes (Signed)
 [place] EMERGENCY DEPARTMENT AT [facility] Provider Note   CSN: [id number] Arrival date & time: 05/15/24  1228     Patient presents with: Shortness of Breath   Stacey Lin is a 58 y.o. female.    Shortness of Breath  Presents because of shortness of breath.  Patient states that she felt short of breath since last night.  Patient states that she does endorse some slight orthopnea.  Does not wear oxygen at home.  No pleuritic chest pain.  No hemoptysis.  No history of DVT or PE.  Patient states that she is having some slight chest pain in the left upper chest.  Hurts worse when she presses on it.  No exertional chest pain.  Patient states has been compliant with all her medications including Lasix .  Currently not smoking.  She feels like maybe she did gain some weight over the past couple days but she is not really sure.    Prior to Admission medications   Medication Sig Start Date End Date Taking? Authorizing Provider  albuterol  (VENTOLIN  HFA) 108 (90 Base) MCG/ACT inhaler Inhale 2 puffs into the lungs every 4 (four) hours as needed for wheezing or shortness of breath. 03/03/24   Fairy Frames, MD  ammonium lactate  (LAC-HYDRIN ) 12 % lotion Apply 1 Application topically as needed for dry skin. 03/13/24   Gaynel Delon CROME, DPM  atorvastatin  (LIPITOR) 40 MG tablet Take 1 tablet (40 mg total) by mouth daily. 03/27/24   Newlin, Enobong, MD  carvedilol  (COREG ) 3.125 MG tablet Take 1 tablet (3.125 mg total) by mouth 2 (two) times daily with a meal. 05/02/24   [name], Amy D, NP  dapagliflozin  propanediol (FARXIGA ) 10 MG TABS tablet Take 1 tablet (10 mg total) by mouth daily. 05/02/24   [name], Amy D, NP  escitalopram  (LEXAPRO ) 20 MG tablet TAKE 1 TABLET BY MOUTH ONCE DAILY Patient not taking: Reported on 05/08/2024 02/21/24   Celestia [name] SQUIBB, NP  famotidine  (PEPCID ) 20 MG tablet One after supper Patient not taking: Reported on 05/08/2024 03/05/24   Darlean Ozell NOVAK, MD  furosemide   (LASIX ) 40 MG tablet Take 1 tablet (40 mg total) by mouth daily. 05/02/24   [name], Amy D, NP  gabapentin  (NEURONTIN ) 300 MG capsule TAKE ONE (1) CAPSULE BY MOUTH TWICE DAILY Patient not taking: Reported on 05/08/2024 09/11/23   Celestia [name] SQUIBB, NP  ipratropium-albuterol  (DUONEB) 0.5-2.5 (3) MG/3ML SOLN Take 3 mLs by nebulization 2 (two) times daily. 03/03/24   Fairy Frames, MD  metFORMIN  (GLUCOPHAGE -XR) 500 MG 24 hr tablet Take 1 tablet (500 mg total) by mouth daily. 03/27/24   Newlin, Enobong, MD  montelukast  (SINGULAIR ) 10 MG tablet TAKE 1 TABLET BY MOUTH AT BEDTIME Patient not taking: Reported on 05/08/2024 09/11/23   Celestia [name] SQUIBB, NP  naloxone  (NARCAN ) nasal spray 4 mg/0.1 mL Inject intranasal for overdose Patient taking differently: Inject intranasal for overdose 04/29/23   Yolande Lamar BROCKS, MD  pantoprazole  (PROTONIX ) 40 MG tablet Take 1 tablet (40 mg total) by mouth daily. Take 30-60 min before first meal of the day Patient not taking: Reported on 05/08/2024 03/05/24   Darlean Ozell NOVAK, MD  spironolactone  (ALDACTONE ) 25 MG tablet TAKE 1 TABLET BY MOUTH ONCE DAILY Patient not taking: Reported on 05/02/2024 08/28/23   Bensimhon, Toribio SAUNDERS, MD  SYMBICORT  80-4.5 MCG/ACT inhaler INHALE TWO (2) PUFFS BY MOUTH TWICE DAILY AS NEEDED FOR SHORTNESS OF BREATH AND/OR WHEEZING 08/14/23   Celestia [name] SQUIBB, NP  valsartan  (DIOVAN ) 40  MG tablet Take 1 tablet (40 mg total) by mouth daily. 03/05/24   Darlean Ozell NOVAK, MD    Allergies: Patient has no known allergies.    Review of Systems  Respiratory:  Positive for shortness of breath.     Updated Vital Signs BP 124/62   Pulse 71   Temp 98.5 F (36.9 C) (Oral)   Resp 17   LMP  (LMP Unknown)   SpO2 97%   Physical Exam Vitals and nursing note reviewed.  Constitutional:      General: She is not in acute distress.    Appearance: She is well-developed.  HENT:     Head: Normocephalic and atraumatic.  Eyes:     Conjunctiva/sclera: Conjunctivae  normal.  Cardiovascular:     Rate and Rhythm: Normal rate and regular rhythm.     Heart sounds: No murmur heard. Pulmonary:     Effort: Pulmonary effort is normal. No respiratory distress.     Breath sounds: Normal breath sounds.  Abdominal:     Palpations: Abdomen is soft.     Tenderness: There is no abdominal tenderness.  Musculoskeletal:        General: No swelling.       Arms:     Cervical back: Neck supple.  Skin:    General: Skin is warm and dry.     Capillary Refill: Capillary refill takes less than 2 seconds.  Neurological:     Mental Status: She is alert.  Psychiatric:        Mood and Affect: Mood normal.     (all labs ordered are listed, but only abnormal results are displayed) Labs Reviewed  BASIC METABOLIC PANEL WITH GFR - Abnormal; Notable for the following components:      Result Value   Glucose, Bld 131 (*)    BUN 22 (*)    Creatinine, Ser 1.22 (*)    GFR, Estimated 51 (*)    All other components within normal limits  CBC  BRAIN NATRIURETIC PEPTIDE  TROPONIN I (HIGH SENSITIVITY)  TROPONIN I (HIGH SENSITIVITY)    EKG: EKG Interpretation Date/Time:  Wednesday May 15 2024 12:41:01 EDT Ventricular Rate:  68 PR Interval:  182 QRS Duration:  122 QT Interval:  418 QTC Calculation: 445 R Axis:   182  Text Interpretation: Sinus or ectopic atrial rhythm Probable left atrial enlargement Consider left ventricular hypertrophy Probable lateral infarct, age indeterminate Confirmed by Simon Rea [phone]) on 05/15/2024 12:43:49 PM  Radiology: [facility] Chest 2 View Result Date: 05/15/2024 CLINICAL DATA:  Shortness of breath. EXAM: CHEST - 2 VIEW COMPARISON:  04/05/2024. FINDINGS: Stable cardiomegaly. Aortic atherosclerosis. No overt edema, focal consolidation, pleural effusion, or pneumothorax. No acute osseous abnormality. IMPRESSION: Cardiomegaly.  No acute cardiopulmonary findings. Electronically Signed   By: Harrietta Sherry M.D.   On: 05/15/2024 13:12      Procedures   Medications Ordered in the ED - No data to display  Clinical Course as of 05/15/24 1703  Wed May 15, 2024  1247 Weight today 69.2  [TL]    Clinical Course User Index [TL] Simon Rea SAILOR, MD                                 Medical Decision Making Amount and/or Complexity of Data Reviewed Labs: ordered. Radiology: ordered.    Presents because of shortness of breath.  Patient states that she felt short of breath since last night.  Patient states that she does endorse some slight orthopnea.  Does not wear oxygen at home.  No pleuritic chest pain.  No hemoptysis.  No history of DVT or PE.  Patient states that she is having some slight chest pain in the left upper chest.  Hurts worse when she presses on it.  No exertional chest pain.  Patient states has been compliant with all her medications including Lasix .  Currently not smoking.  She feels like maybe she did gain some weight over the past couple days but she is not really sure.    Upon exam, patient hemodynamic stable.  ANO x 3 GCS 15.  O2 saturation 98% on room air.  Maps appropriate.   EKG reviewed.   No changes compared to prior.  Lung sounds slightly diminished bilaterally.  Mild expiratory wheezing.   BNP unremarkable.  Chest x-ray showed no pleural effusion or pulmonary edema.  Looks euvolemic on exam.  No indication to change Lasix  dosing.   EKG sinus rhythm.  No STEMI.  Initial troponin negative.  Pending repeat troponin.   Plan will be to discharge patient home.     Final diagnoses:  SOB (shortness of breath)    ED Discharge Orders     None          Simon Lavonia SAILOR, MD 05/15/24 [phone]

## 2024-05-15 NOTE — ED Triage Notes (Signed)
 BIB [facility] from home, community paramedic had visited the patient and found the patient to be [facility]. Patient is part of the heart failure clinic. History of COPD.   EMS gave 1 duoneb en route  BP 127/74 HR 73 RR 17 98% 2L [place]

## 2024-05-17 ENCOUNTER — Telehealth (HOSPITAL_COMMUNITY): Payer: Self-pay

## 2024-05-17 NOTE — Progress Notes (Incomplete)
 ***In Progress***    Advanced Heart Failure Clinic Note  PCP: Dr Kristy  Pulmonologist: Dr. Corrie HF: Dr. Cherrie   HPI: Stacey Lin is a 58 y.o. 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  EF as low as 30-35% in 2014.   cMRI 08/27/2013: EF 32% RV mildly dilated, no scar or infiltrative disease. Mod MR and Mod-sev TR  R/LHC (06/06/2014): ectatic coronaries (suspect d/t HTN) no significant arthrosclerosis and normal EF. No significant MR noted  Echo (05/2014): EF 55-60%, mild/mod AI, grade I DD and mild MR  EF stable at 55-60% in 2019.   She was admitted in 03/2022 with acute respiratory failure with hypoxia 2/2 acute on chronic systolic CHF. EF down to 30-35% on echo with normal RV, severe BAE, moderate to severe MR, moderate to severe TR and moderate to severe AI. Diuresed and GDMT titrated. R/LHC with no CAD and reduced Fick CI of 1.96 L/min/m2.  Readmit 07/2023 with COPD exacerbation in setting of noncompliance with medications and ongoing tobacco use + recent cocaine  use. Repeat echo with EF 40-45%.  Admitted 10/2023 with acute respiratory failure 2/2 COPD exacerbation and influenza A. She also had acute on chronic CHF and diuresed with IV lasix .  Admitted in 03/2024 with pneumonia. Echo down 30-35%. Mild MR/ mild AS. She was discharged on carvedilol  3.125 mg BID, Farxiga  10 mg daily, furosemide  40 mg daily. Spironolactone  and valsartan  were held at discharge due to AKI from overdiuresis and low BP.   On 05/02/2024, she returned for HF follow up. She was complaining of shortness of breath. Denied PND/Orthopnea. Appetite was ok. No fever or chills. She had not been taking furosemide , carvedilol , Farxiga , Duoneb, or Lexapro . She said she moved to her sons house to get out of a bad living situation and lost some of her pill packs. Said she had not  used cocaine  in 2 months. Lives with her son. At the end of the visit she was restarted on carvedilol  3.125 mg BID, valsartan  40 mg daily, Farxiga  10 mg daily, and furosemide  40 mg daily.   On 05/15/2024, paramedicine arrived to patient's home where she appeared to be in respiratory distress. Patient stated that she did not feel well. Reported some mild chest pain that would come and go. Reported to have inspiratory and expiratory wheezing. Denied nausea and vomiting. She was given albuterol  and atrovent  via mask with some improvement. She denied drug use of any kind, and reported compliance with medications. Initially she did not wish to be transported to hospital, but then asked for further workup. Workup at hospital was unremarkable and she was discharged.   Today she returns to HF clinic for pharmacist medication titration.    Overall feeling ***. Dizziness, lightheadedness, fatigue:  Chest pain or palpitations:  How is your breathing?: *** SOB: Able to complete all ADLs. Activity level ***  Weight at home pounds. Takes furosemide /torsemide /bumex *** mg *** daily.  LEE PND/Orthopnea  Appetite *** Low-salt diet:   Physical Exam Cost/affordability of meds   HF Medications: Carvedilol  3.125 mg BID Valsartan  40 mg daily  Farxiga  10 mg daily  Furosemide  40 mg daily  Has the patient been experiencing any side effects to the medications prescribed?  {YES NO:22349}  Does the patient have any problems obtaining medications due to transportation or finances?   {YES NO:22349}  Understanding of regimen: {excellent/good/fair/poor:19665} Understanding of indications: {excellent/good/fair/poor:19665} Potential  of compliance: {excellent/good/fair/poor:19665} Patient understands to avoid NSAIDs. Patient understands to avoid decongestants.    Pertinent Lab Values: 05/15/2024: Serum creatinine 1.22, BUN 22, Potassium 3.6, Sodium 139, BNP 83.3  Vital Signs: Weight: *** lbs (last clinic  weight: 156.4 lbs) Blood pressure: *** mmHg Heart rate: *** bpm  Assessment/Plan: 1) Chronic systolic HF:   - Nonischemic cardiomyopathy.  - EF 30-35% in 2014 - Cath 8/15 showed normal cors  - Echo 03/2018 EF 55-65%  - Echo 03/2022: EF down to 30-35% on echo with normal RV, severe BAE, moderate to severe MR, moderate to severe TR and moderate to severe AI - Echo 07/2023: EF 40-45%, RV okay, PASP 65 mmHg, moderate to severe MR, mild to moderate TR, moderate AI -Echo 02/2024: LVEF 30-35%.  - NYHA ***.  Volume ***.  - Continue furosemide  40 mg daily *** - enough dose?  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 Reinforced medication compliance.  - Continue valsartan  *** .  - Continue carvedilol  3.125 mg BID. *** - Hold off on spironolactone . ***  4) Substance abuse Last UDS + cocaine   02/2024.  - Denies recent drug use. ***  5) Tobacco Abuse -Continues to smoke. Cessation advised ***  Follow up in 2 weeks with APP. BMET at this time.   Tinnie Redman, PharmD, BCPS, BCCP, CPP Heart Failure Clinic Pharmacist [phone]

## 2024-05-17 NOTE — Telephone Encounter (Signed)
 Spoke to Ms. Blackshire to confirm she was home post discharge and she had no needs at present. Dede will follow up next week.   Powell Mirza, EMT-Paramedic [phone] 05/17/2024

## 2024-05-20 ENCOUNTER — Inpatient Hospital Stay: Payer: Self-pay | Admitting: Student

## 2024-05-20 ENCOUNTER — Inpatient Hospital Stay (HOSPITAL_COMMUNITY): Admission: RE | Admit: 2024-05-20 | Source: Ambulatory Visit

## 2024-05-20 NOTE — Progress Notes (Deleted)
 CC: Hospital follow-up.  HPI:  [name] is a 58 y.o. female living with a history stated below and presents today for hospital follow-up.  She was last seen in the ED on 07/23 for shortness of breath. Chest x-ray unremarkable, BNP normal. ?  Asthma/COPD   -New patient, was hospitalized for acute on chronic systolic heart failure, secondary to COPD exacerbation and viral pneumonia.   Please see problem based assessment and plan for additional details.  Past Medical History:  Diagnosis Date   Anemia    Arrhythmia    Asthma    CHF (congestive heart failure) (HCC)    1990s   Chronic headaches    COPD (chronic obstructive pulmonary disease) (HCC)    ??   Hypercholesteremia    Hypertension     Current Outpatient Medications on File Prior to Visit  Medication Sig Dispense Refill   albuterol  (VENTOLIN  HFA) 108 (90 Base) MCG/ACT inhaler Inhale 2 puffs into the lungs every 4 (four) hours as needed for wheezing or shortness of breath. 17 g 1   ammonium lactate  (LAC-HYDRIN ) 12 % lotion Apply 1 Application topically as needed for dry skin. 400 g 5   atorvastatin  (LIPITOR) 40 MG tablet Take 1 tablet (40 mg total) by mouth daily. 90 tablet 1   carvedilol  (COREG ) 3.125 MG tablet Take 1 tablet (3.125 mg total) by mouth 2 (two) times daily with a meal. 60 tablet 3   dapagliflozin  propanediol (FARXIGA ) 10 MG TABS tablet Take 1 tablet (10 mg total) by mouth daily. 30 tablet 6   escitalopram  (LEXAPRO ) 20 MG tablet TAKE 1 TABLET BY MOUTH ONCE DAILY (Patient not taking: Reported on 05/15/2024) 90 tablet 0   famotidine  (PEPCID ) 20 MG tablet One after supper (Patient not taking: Reported on 05/15/2024) 30 tablet 11   furosemide  (LASIX ) 40 MG tablet Take 1 tablet (40 mg total) by mouth daily. 30 tablet 3   gabapentin  (NEURONTIN ) 300 MG capsule TAKE ONE (1) CAPSULE BY MOUTH TWICE DAILY (Patient not taking: Reported on 05/15/2024) 60 capsule 10   ipratropium-albuterol  (DUONEB) 0.5-2.5 (3) MG/3ML  SOLN Take 3 mLs by nebulization 2 (two) times daily. 180 mL 1   metFORMIN  (GLUCOPHAGE -XR) 500 MG 24 hr tablet Take 1 tablet (500 mg total) by mouth daily. 90 tablet 1   montelukast  (SINGULAIR ) 10 MG tablet TAKE 1 TABLET BY MOUTH AT BEDTIME (Patient not taking: Reported on 05/15/2024) 30 tablet 10   naloxone  (NARCAN ) nasal spray 4 mg/0.1 mL Inject intranasal for overdose (Patient taking differently: Inject intranasal for overdose) 1 each 1   pantoprazole  (PROTONIX ) 40 MG tablet Take 1 tablet (40 mg total) by mouth daily. Take 30-60 min before first meal of the day (Patient not taking: Reported on 05/15/2024) 30 tablet 2   [Paused] spironolactone  (ALDACTONE ) 25 MG tablet TAKE 1 TABLET BY MOUTH ONCE DAILY (Patient not taking: Reported on 05/15/2024) 30 tablet 10   SYMBICORT  80-4.5 MCG/ACT inhaler INHALE TWO (2) PUFFS BY MOUTH TWICE DAILY AS NEEDED FOR SHORTNESS OF BREATH AND/OR WHEEZING (Patient not taking: Reported on 05/15/2024) 10.2 g 10   valsartan  (DIOVAN ) 40 MG tablet Take 1 tablet (40 mg total) by mouth daily. 30 tablet 11   No current facility-administered medications on file prior to visit.    Review of Systems: ROS negative except for what is noted on the assessment and plan.  There were no vitals filed for this visit. {Labs (Optional):23779} {Vitals History (Optional):23777}  Physical Exam: Constitutional: NAD Cardiovascular: RRR, no murmurs.  Pulmonary/Chest: Clear bilateral lungs Abdominal: soft, non-tender, non-distended.  Assessment & Plan:   Patient {GC/GE:[id number]::discussed with,seen with} Dr. {WJFZD:[id number]::Tpoopjfd,Z. Hoffman,Chambliss, Winfrey,Lau,Machen}  No problem-specific Assessment & Plan notes found for this encounter.   Acute on chronic HFrEF  She is on Lasix  40 mg daily.  Weight on discharge 72.5 kg. -Chest pain, dyspnea, orthopnea -Weight? - GDMT: Carvedilol  3.125 mg, and Farxiga  10 mg. -BMP - Restart spironolactone  if BP more appropriate.               b.  Hot flashes Intermittent hot flashes, suspect vasomotor symptoms of menopause.  Could consider evaluation for paroxetine. - NB she is on escitalopram  for anxiety/depression -could consider Paroxetine 7.5 mg @bedtime    c. Uncomplicated intertrigo. Pruritic erythematous papules and plaques under bilateral breasts.  No satellite lesions.  Discharged with clotrimazole  cream. -Please follow-up on the lesions to ensure resolution.   Stacey Sandhoff, MD [facility] Internal Medicine, PGY-2  Date 05/20/2024 Time 7:57 AM

## 2024-05-28 ENCOUNTER — Telehealth (HOSPITAL_COMMUNITY): Payer: Self-pay | Admitting: Emergency Medicine

## 2024-05-28 NOTE — Telephone Encounter (Signed)
 Reached out to Stacey Lin and she advised she is still in [place] w/ her daughter.  She advises she has no compliants currently.  She states she is taking her meds.  She says she should be returning to her son's house in [place] on Saturday.  When I advised her that [place] or [place] will be reaching out to assist her with her meds she stated, Remember, my daughter is a Engineer, civil (consulting) and she can set my pills up for next week.  I scheduled a home visit w/ her for 8/ 20/25 @ 11:15 and she agreed to same.      Mary Sharps, EMT-Paramedic [phone] 05/28/2024

## 2024-05-31 ENCOUNTER — Other Ambulatory Visit: Payer: Self-pay | Admitting: Internal Medicine

## 2024-05-31 NOTE — Progress Notes (Incomplete)
 Patient ID: Shemeka Savage, female   DOB: 20-May-1966, 58 y.o.   MRN: [id number]    Advanced Heart Failure Clinic Note   PCP: Dr Kristy  Pulmonologist: Dr. Corrie HF: Dr. Cherrie   HPI: Ms. 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  EF as low as 30-35% in 2014.   cMRI 08/27/13: EF 32% RV mildly dilated, no scar or infiltrative disease. Mod MR and Mod-sev TR  R/LHC (06/06/14): ectatic coronaries (suspect d/t HTN) no significant arthrosclerosis and normal EF. No significant MR noted  Echo (05/2014): EF 55-60%, mild/mod AI, grade I DD and mild MR  EF stable at 55-60% in 2019.   She was admitted in 06/23 with acute respiratory failure with hypoxia 2/2 acute on chronic systolic CHF. EF down to 30-35% on echo with normal RV, severe BAE, moderate to severe MR, moderate to severe TR and moderate to severe AI. Diuresed and GDMT titrated. R/LHC with no CAD and reduced Fick CI of 1.96 L/min/m2.  Readmit 10/24 with COPD exacerbation in setting of noncompliance with medications and ongoing tobacco use + recent cocaine  use. Repeat echo with EF 40-45%.  Admitted 01/25 with acute respiratory failure 2/2 COPD exacerbation and influenza A. She also had acute on chronic CHF and diuresed with IV lasix .  Admitted in 03/2024 with pneumonia. Echo down 30-35%. Mild MR/ mild AS. She was not discharged on spiro due to elevated potassium.   Today she returns for HF follow up. Complaining of shortness of breath. Denies PND/Orthopnea. Appetite ok. No fever or chills. She has not been taking lasix , coreg , farxiga , duoneb, or lexapro . She said she moved to her sons house to get out of a bad living situation and lost some of her pill packs. She did not take any medications this morning. Says she has not used cocaine  in 2 months. Lives with her son.   FH:  Mother deceased: Dementia, seizures, stomach cancer        Father deceased: HTN  Review of systems complete and found to be negative unless listed in HPI.    Past Medical History:  Diagnosis Date   Anemia    Arrhythmia    Asthma    CHF (congestive heart failure) (HCC)    1990s   Chronic headaches    COPD (chronic obstructive pulmonary disease) (HCC)    ??   Hypercholesteremia    Hypertension     Current Outpatient Medications  Medication Sig Dispense Refill   albuterol  (VENTOLIN  HFA) 108 (90 Base) MCG/ACT inhaler Inhale 2 puffs into the lungs every 4 (four) hours as needed for wheezing or shortness of breath. 17 g 1   ammonium lactate  (LAC-HYDRIN ) 12 % lotion Apply 1 Application topically as needed for dry skin. 400 g 5   atorvastatin  (LIPITOR) 40 MG tablet Take 1 tablet (40 mg total) by mouth daily. 90 tablet 1   carvedilol  (COREG ) 3.125 MG tablet Take 1 tablet (3.125 mg total) by mouth 2 (two) times daily with a meal. 60 tablet 3   dapagliflozin  propanediol (FARXIGA ) 10 MG TABS tablet Take 1 tablet (10 mg total) by mouth daily. 30 tablet 6   escitalopram  (LEXAPRO ) 20 MG tablet TAKE 1 TABLET BY MOUTH ONCE DAILY (Patient not taking: Reported on 05/15/2024) 90 tablet 0   famotidine  (PEPCID ) 20 MG tablet One after supper (Patient not taking: Reported on 05/15/2024) 30  tablet 11   furosemide  (LASIX ) 40 MG tablet Take 1 tablet (40 mg total) by mouth daily. 30 tablet 3   gabapentin  (NEURONTIN ) 300 MG capsule TAKE ONE (1) CAPSULE BY MOUTH TWICE DAILY (Patient not taking: Reported on 05/15/2024) 60 capsule 10   ipratropium-albuterol  (DUONEB) 0.5-2.5 (3) MG/3ML SOLN Take 3 mLs by nebulization 2 (two) times daily. 180 mL 1   metFORMIN  (GLUCOPHAGE -XR) 500 MG 24 hr tablet Take 1 tablet (500 mg total) by mouth daily. 90 tablet 1   montelukast  (SINGULAIR ) 10 MG tablet TAKE 1 TABLET BY MOUTH AT BEDTIME (Patient not taking: Reported on 05/15/2024) 30 tablet 10   naloxone  (NARCAN ) nasal spray 4 mg/0.1  mL Inject intranasal for overdose (Patient taking differently: Inject intranasal for overdose) 1 each 1   pantoprazole  (PROTONIX ) 40 MG tablet Take 1 tablet (40 mg total) by mouth daily. Take 30-60 min before first meal of the day (Patient not taking: Reported on 05/15/2024) 30 tablet 2   [Paused] spironolactone  (ALDACTONE ) 25 MG tablet TAKE 1 TABLET BY MOUTH ONCE DAILY (Patient not taking: Reported on 05/15/2024) 30 tablet 10   SYMBICORT  80-4.5 MCG/ACT inhaler INHALE TWO (2) PUFFS BY MOUTH TWICE DAILY AS NEEDED FOR SHORTNESS OF BREATH AND/OR WHEEZING (Patient not taking: Reported on 05/15/2024) 10.2 g 10   valsartan  (DIOVAN ) 40 MG tablet Take 1 tablet (40 mg total) by mouth daily. 30 tablet 11   No current facility-administered medications for this visit.   There were no vitals filed for this visit.    Wt Readings from Last 3 Encounters:  05/08/24 67.2 kg (148 lb 3.2 oz)  05/02/24 70.9 kg (156 lb 6.4 oz)  04/10/24 70.5 kg (155 lb 6.8 oz)     PHYSICAL EXAM: General:   No resp difficulty Neck: supple. JVP 8-9  Cor: PMI nondisplaced. Regular rate & rhythm. No rubs, gallops or murmurs. Lungs: clear Abdomen: soft, nontender, nondistended.  Extremities: no cyanosis, clubbing, rash, edema Neuro: alert & oriented x3  ASSESSMENT & PLAN:  1) Chronic systolic HF:   - Nonischemic cardiomyopathy.  - EF 30-35% in 2014 - Cath 8/15 showed normal cors  - Echo 6/19 EF 55-65%  - Echo 06/23: EF down to 30-35% on echo with normal RV, severe BAE, moderate to severe MR, moderate to severe TR and moderate to severe AI - Echo 10/24: EF 40-45%, RV okay, PASP 65 mmHg, moderate to severe MR, mild to moderate TR, moderate AI -Echo 02/2024: LVEF 30-35%.  - NYHA III.  Volume status mildly elevated on exam. Restart lasix  40 mg daily. ReDs reading: 34 %, normal.  - Hold spiro due to recent hyperkalemia.  - Continue valsartan  40 mg daily  - Restart Farxiga  10 mg daily - Restart coreg  3.125 mg BID - Check BMET   -Repeat Echo 3-4 months after HF meds optimized.   2) Valvular hear disease -2025 Mild MR  3) HTN:  -Elevated. Reinforced medication compliance.  - Continue valsartan  .  -Restart coreg .  - Hold off on spironolactone .   4) Substance abuse Last UDS + cocaine   02/2024. Denies recent drug use.   5) Tobacco Abuse -Continues to smoke. Cessation advised  SDOH: Now living with her son.   Follow up in 2 weeks with pharmacy and 4 weeks with APP Refer to HF Paramedicine.  She is at high risk for readmit due to poor insight.    Harlene HERO Euna Armon NP-C  10:37 AM

## 2024-06-03 ENCOUNTER — Telehealth (HOSPITAL_COMMUNITY): Payer: Self-pay | Admitting: *Deleted

## 2024-06-03 ENCOUNTER — Telehealth (HOSPITAL_COMMUNITY): Payer: Self-pay

## 2024-06-03 NOTE — Telephone Encounter (Signed)
 Called to confirm/remind patient of their appointment at the Advanced [facility] Failure Clinic on 06/04/24.     Appointment:              [] Confirmed             [x] Left mess              [] No answer/No voice mail             [] Phone not in service   Patient reminded to bring all medications and/or complete list.   Confirmed patient has transportation. Gave directions, instructed to utilize valet parking.

## 2024-06-03 NOTE — Telephone Encounter (Signed)
 Called to confirm/remind patient of their appointment at the [facility] Clinic on 06/04/24. However, patient rescheduled for another day.

## 2024-06-04 ENCOUNTER — Encounter (HOSPITAL_COMMUNITY)

## 2024-06-05 ENCOUNTER — Telehealth: Payer: Self-pay | Admitting: Pharmacist

## 2024-06-05 ENCOUNTER — Other Ambulatory Visit (HOSPITAL_BASED_OUTPATIENT_CLINIC_OR_DEPARTMENT_OTHER): Payer: Self-pay | Admitting: Pharmacist

## 2024-06-05 DIAGNOSIS — E785 Hyperlipidemia, unspecified: Secondary | ICD-10-CM

## 2024-06-05 DIAGNOSIS — E119 Type 2 diabetes mellitus without complications: Secondary | ICD-10-CM

## 2024-06-05 DIAGNOSIS — Z7984 Long term (current) use of oral hypoglycemic drugs: Secondary | ICD-10-CM

## 2024-06-05 MED ORDER — DAPAGLIFLOZIN PROPANEDIOL 10 MG PO TABS: 10.0000 mg | ORAL_TABLET | Freq: Every day | ORAL | 6 refills | Status: DC

## 2024-06-05 NOTE — Progress Notes (Signed)
 Pharmacy Quality Measure Review  This patient is appearing on a report for being at risk of failing the adherence measure for cholesterol (statin) and diabetes medications this calendar year.   Medication: atorvastatin  Last fill date: 05/06/2024 for 30 day supply  Medication: dapagliflozin   Last fill date: 05/03/2024 for 30 day supply  Medication: metformin  Last fill date: 05/06/2024 for 30 day supply  Medication: valsartan  Last fill date: 05/16/2024 for 90 day supply.   Refills are due for atorvastatin , dapagliflozin , and metformin . Call placed to pharmacy for refills. They will have these ready for pick up this afternoon. I called the patient and informed her.   Herlene Fleeta Morris, PharmD, JAQUELINE, CPP Clinical Pharmacist [facility] [phone]

## 2024-06-05 NOTE — Telephone Encounter (Signed)
 I spoke to this patient today for refills. She wants to make an appt to see [name]. Would we be able to call and schedule her an appt?

## 2024-06-06 ENCOUNTER — Encounter (INDEPENDENT_AMBULATORY_CARE_PROVIDER_SITE_OTHER): Payer: Self-pay | Admitting: Primary Care

## 2024-06-06 ENCOUNTER — Ambulatory Visit (INDEPENDENT_AMBULATORY_CARE_PROVIDER_SITE_OTHER): Admitting: Primary Care

## 2024-06-06 VITALS — BP 138/82 | HR 71 | Resp 18 | Ht 61.0 in | Wt 153.0 lb

## 2024-06-06 DIAGNOSIS — I10 Essential (primary) hypertension: Secondary | ICD-10-CM

## 2024-06-06 DIAGNOSIS — R0609 Other forms of dyspnea: Secondary | ICD-10-CM

## 2024-06-06 DIAGNOSIS — Z760 Encounter for issue of repeat prescription: Secondary | ICD-10-CM

## 2024-06-06 DIAGNOSIS — E119 Type 2 diabetes mellitus without complications: Secondary | ICD-10-CM | POA: Diagnosis not present

## 2024-06-06 DIAGNOSIS — G4700 Insomnia, unspecified: Secondary | ICD-10-CM

## 2024-06-06 MED ORDER — VALSARTAN 40 MG PO TABS: 40.0000 mg | ORAL_TABLET | Freq: Every day | ORAL | 1 refills | Status: DC

## 2024-06-06 MED ORDER — METFORMIN HCL ER 500 MG PO TB24: 500.0000 mg | ORAL_TABLET | Freq: Every day | ORAL | 1 refills | Status: DC

## 2024-06-06 MED ORDER — DAPAGLIFLOZIN PROPANEDIOL 10 MG PO TABS
10.0000 mg | ORAL_TABLET | Freq: Every day | ORAL | 1 refills | Status: AC
Start: 2024-06-06 — End: ?

## 2024-06-06 MED ORDER — FLUTICASONE FUROATE-VILANTEROL 100-25 MCG/ACT IN AEPB: 1.0000 | INHALATION_SPRAY | Freq: Every day | RESPIRATORY_TRACT | 11 refills | Status: DC

## 2024-06-06 MED ORDER — QUETIAPINE FUMARATE 50 MG PO TABS
50.0000 mg | ORAL_TABLET | Freq: Every day | ORAL | 1 refills | Status: DC
Start: 2024-06-06 — End: 2024-09-06

## 2024-06-06 NOTE — Progress Notes (Unsigned)
 [facility]   Stacey Lin is a 58 y.o. female presents for hypertension evaluation, Denies shortness of breath, headaches, chest pain or lower extremity edema, sudden onset, vision changes, unilateral weakness, dizziness, paresthesias   Patient {Actions; denies-reports:120008} adherence with medications.  Dietary habits include: *** Exercise habits include:*** Family / Social history: ***   Past Medical History:  Diagnosis Date   Anemia    Arrhythmia    Asthma    CHF (congestive heart failure) (HCC)    1990s   Chronic headaches    COPD (chronic obstructive pulmonary disease) (HCC)    ??   Hypercholesteremia    Hypertension    Past Surgical History:  Procedure Laterality Date   LEFT AND RIGHT HEART CATHETERIZATION WITH CORONARY ANGIOGRAM N/A 06/06/2014   Procedure: LEFT AND RIGHT HEART CATHETERIZATION WITH CORONARY ANGIOGRAM;  Surgeon: Toribio JONELLE Fuel, MD;  Location: [facility] CATH LAB;  Service: Cardiovascular;  Laterality: N/A;   MULTIPLE EXTRACTIONS WITH ALVEOLOPLASTY Bilateral 07/24/2015   Procedure: MULTIPLE EXTRACTIONS WITH ALVEOLOPLASTY,  BILATERAL TORI ;  Surgeon: Glendia Primrose, DDS;  Location: [facility] OR;  Service: Oral Surgery;  Laterality: Bilateral;   RIGHT/LEFT HEART CATH AND CORONARY ANGIOGRAPHY N/A 04/11/2022   Procedure: RIGHT/LEFT HEART CATH AND CORONARY ANGIOGRAPHY;  Surgeon: Dann Candyce RAMAN, MD;  Location: [facility] INVASIVE CV LAB;  Service: Cardiovascular;  Laterality: N/A;   TUBAL LIGATION  1986   No Known Allergies Current Outpatient Medications on File Prior to Visit  Medication Sig Dispense Refill   albuterol  (VENTOLIN  HFA) 108 (90 Base) MCG/ACT inhaler Inhale 2 puffs into the lungs every 4 (four) hours as needed for wheezing or shortness of breath. 17 g 1   ammonium lactate  (LAC-HYDRIN ) 12 % lotion Apply 1 Application topically as needed for dry skin. 400 g 5   atorvastatin  (LIPITOR) 40 MG tablet Take 1 tablet (40 mg total) by mouth daily. 90  tablet 1   carvedilol  (COREG ) 3.125 MG tablet Take 1 tablet (3.125 mg total) by mouth 2 (two) times daily with a meal. 60 tablet 3   dapagliflozin  propanediol (FARXIGA ) 10 MG TABS tablet Take 1 tablet (10 mg total) by mouth daily. 30 tablet 6   furosemide  (LASIX ) 40 MG tablet Take 1 tablet (40 mg total) by mouth daily. 30 tablet 3   ipratropium-albuterol  (DUONEB) 0.5-2.5 (3) MG/3ML SOLN Take 3 mLs by nebulization 2 (two) times daily. 180 mL 1   metFORMIN  (GLUCOPHAGE -XR) 500 MG 24 hr tablet Take 1 tablet (500 mg total) by mouth daily. 90 tablet 1   naloxone  (NARCAN ) nasal spray 4 mg/0.1 mL Inject intranasal for overdose 1 each 1   valsartan  (DIOVAN ) 40 MG tablet Take 1 tablet (40 mg total) by mouth daily. 30 tablet 11   escitalopram  (LEXAPRO ) 20 MG tablet TAKE 1 TABLET BY MOUTH ONCE DAILY (Patient not taking: Reported on 06/06/2024) 90 tablet 0   famotidine  (PEPCID ) 20 MG tablet One after supper (Patient not taking: Reported on 06/06/2024) 30 tablet 11   gabapentin  (NEURONTIN ) 300 MG capsule TAKE ONE (1) CAPSULE BY MOUTH TWICE DAILY (Patient not taking: Reported on 06/06/2024) 60 capsule 10   montelukast  (SINGULAIR ) 10 MG tablet TAKE 1 TABLET BY MOUTH AT BEDTIME (Patient not taking: Reported on 06/06/2024) 30 tablet 10   pantoprazole  (PROTONIX ) 40 MG tablet TAKE 1 TABLET(40 MG) BY MOUTH DAILY 30 TO 60 MINUTES BEFORE FIRST MEAL OF THE DAY (Patient not taking: Reported on 06/06/2024) 30 tablet 2   [Paused] spironolactone  (ALDACTONE ) 25 MG tablet TAKE  1 TABLET BY MOUTH ONCE DAILY (Patient not taking: Reported on 06/06/2024) 30 tablet 10   SYMBICORT  80-4.5 MCG/ACT inhaler INHALE TWO (2) PUFFS BY MOUTH TWICE DAILY AS NEEDED FOR SHORTNESS OF BREATH AND/OR WHEEZING (Patient not taking: Reported on 05/15/2024) 10.2 g 10   No current facility-administered medications on file prior to visit.   Social History   Socioeconomic History   Marital status: Divorced    Spouse name: Not on file   Number of children: Not  on file   Years of education: Not on file   Highest education level: Some college, no degree  Occupational History   Not on file  Tobacco Use   Smoking status: Former    Current packs/day: 0.00    Average packs/day: 1 pack/day for 35.0 years (35.0 ttl pk-yrs)    Types: Cigarettes    Start date: 08/25/1984    Quit date: 08/26/2019    Years since quitting: 4.7   Smokeless tobacco: Never  Vaping Use   Vaping status: Former  Substance and Sexual Activity   Alcohol use: No    Alcohol/week: 0.0 standard drinks of alcohol   Drug use: Not Currently    Frequency: 4.0 times per week    Types: Crack cocaine     Comment: RELAPSE 12/2016   Sexual activity: Not on file  Other Topics Concern   Not on file  Social History Narrative   Lives with son in an apartment on the third floor.  Does not work.  On disability.  Previously worked in [employer].    Education: high school.     Social Drivers of Corporate investment banker Strain: Low Risk  (05/06/2024)   Overall Financial Resource Strain (CARDIA)    Difficulty of Paying Living Expenses: Not very hard  Food Insecurity: No Food Insecurity (05/06/2024)   Hunger Vital Sign    Worried About Running Out of Food in the Last Year: Never true    Ran Out of Food in the Last Year: Never true  Recent Concern: Food Insecurity - Food Insecurity Present (02/28/2024)   Hunger Vital Sign    Worried About Running Out of Food in the Last Year: Sometimes true    Ran Out of Food in the Last Year: Sometimes true  Transportation Needs: No Transportation Needs (05/06/2024)   PRAPARE - Administrator, Civil Service (Medical): No    Lack of Transportation (Non-Medical): No  Recent Concern: Transportation Needs - Unmet Transportation Needs (02/28/2024)   PRAPARE - Transportation    Lack of Transportation (Medical): Yes    Lack of Transportation (Non-Medical): Yes  Physical Activity: Unknown (10/18/2023)   Exercise Vital Sign    Days of Exercise per  Week: 2 days    Minutes of Exercise per Session: Patient declined  Recent Concern: Physical Activity - Inactive (08/02/2023)   Exercise Vital Sign    Days of Exercise per Week: 1 day    Minutes of Exercise per Session: 0 min  Stress: Stress Concern Present (05/06/2024)   [employer] of Occupational Health - Occupational Stress Questionnaire    Feeling of Stress: To some extent  Social Connections: Moderately Integrated (04/05/2024)   Social Connection and Isolation Panel    Frequency of Communication with Friends and Family: More than three times a week    Frequency of Social Gatherings with Friends and Family: More than three times a week    Attends Religious Services: More than 4 times per year  Active Member of Clubs or Organizations: Yes    Attends Banker Meetings: More than 4 times per year    Marital Status: Never married  Intimate Partner Violence: Not At Risk (04/05/2024)   Humiliation, Afraid, Rape, and Kick questionnaire    Fear of Current or Ex-Partner: No    Emotionally Abused: No    Physically Abused: No    Sexually Abused: No   Family History  Problem Relation Age of Onset   Hypertension Mother    Allergies Mother    Asthma Mother    Heart disease Mother    Stomach cancer Mother        Deceased, 17   Seizures Mother    Hypertension Father        Deceased   Hypertension Maternal Grandmother    Healthy Brother    Healthy Son    Healthy Daughter    Health Maintenance  Topic Date Due   Medicare Annual Wellness (AWV)  Never done   Zoster Vaccines- Shingrix (1 of 2) Never done   Pneumococcal Vaccine: 50+ Years (2 of 2 - PCV) 04/05/2017   Cervical Cancer Screening (HPV/Pap Cotest)  02/22/2021   Lung Cancer Screening  04/13/2023   OPHTHALMOLOGY EXAM  05/19/2023   COVID-19 Vaccine (1 - 2024-25 season) Never done   INFLUENZA VACCINE  05/24/2024   HEMOGLOBIN A1C  08/30/2024   Diabetic kidney evaluation - Urine ACR  10/11/2024   FOOT EXAM   03/13/2025   Diabetic kidney evaluation - eGFR measurement  05/15/2025   Fecal DNA (Cologuard)  12/06/2025   MAMMOGRAM  01/14/2026   DTaP/Tdap/Td (2 - Td or Tdap) 02/23/2028   Hepatitis C Screening  Completed   HIV Screening  Completed   Hepatitis B Vaccines 19-59 Average Risk  Aged Out   HPV VACCINES  Aged Out   Meningococcal B Vaccine  Aged Out   Pneumococcal Vaccine  Discontinued     OBJECTIVE:  Vitals:   06/06/24 1106  BP: 138/82  Pulse: 71  Resp: 18  SpO2: 97%  Weight: 153 lb (69.4 kg)  Height: 5' 1 (1.549 m)    Physical Exam   ROS  Last 3 Office BP readings: BP Readings from Last 3 Encounters:  06/06/24 138/82  05/15/24 124/62  05/15/24 (!) 140/80    BMET    Component Value Date/Time   NA 139 05/15/2024 1245   NA 143 11/27/2018 1537   K 3.6 05/15/2024 1245   CL 104 05/15/2024 1245   CO2 25 05/15/2024 1245   GLUCOSE 131 (H) 05/15/2024 1245   BUN 22 (H) 05/15/2024 1245   BUN 18 11/27/2018 1537   CREATININE 1.22 (H) 05/15/2024 1245   CREATININE 1.49 (H) 09/16/2015 0810   CALCIUM  9.2 05/15/2024 1245   GFRNONAA 51 (L) 05/15/2024 1245   GFRNONAA 54 (L) 03/31/2014 1011   GFRAA >60 09/03/2019 1118   GFRAA 62 03/31/2014 1011    Renal function: CrCl cannot be calculated (Patient's most recent lab result is older than the maximum 21 days allowed.).  Clinical ASCVD: Yes  The 10-year ASCVD risk score (Arnett DK, et al., 2019) is: 14.7%   Values used to calculate the score:     Age: 74 years     Clincally relevant sex: Female     Is Non-Hispanic African American: Yes     Diabetic: Yes     Tobacco smoker: No     Systolic Blood Pressure: 138 mmHg     Is BP  treated: Yes     HDL Cholesterol: 70 mg/dL     Total Cholesterol: 173 mg/dL  ASCVD risk factors include- ITALY   ASSESSMENT & PLAN:  No diagnosis found.  No orders of the defined types were placed in this encounter.   -Counseled on lifestyle modifications for blood pressure control including  reduced dietary sodium, increased exercise, weight reduction and adequate sleep. Also, educated patient about the risk for cardiovascular events, stroke and heart attack. Also counseled patient about the importance of medication adherence. If you participate in smoking, it is important to stop using tobacco as this will increase the risks associated with uncontrolled blood pressure.   -Hypertension longstanding/newly diagnosed currently *** on current medications. Patient {Is/is not:9024} adherent with current medications.   Goal BP:  For patients younger than 60: Goal BP < 130/80. For patients 60 and older: Goal BP < 140/90. For patients with diabetes: Goal BP < 130/80. Your most recent BP: ***  Minimize salt intake. Minimize alcohol intake    This note has been created with Education officer, environmental. Any transcriptional errors are unintentional.   [name] SHAUNNA Bohr, NP 06/06/2024, 11:13 AM

## 2024-06-10 ENCOUNTER — Telehealth (HOSPITAL_COMMUNITY): Payer: Self-pay | Admitting: Licensed Clinical Social Worker

## 2024-06-10 NOTE — Progress Notes (Incomplete)
 Patient ID: Stacey Lin, female   DOB: October 10, 1966, 58 y.o.   MRN: [id number]    Advanced Heart Failure Clinic Note   PCP: Celestia [name] SQUIBB, NP Pulmonologist: Dr. Darlean HF Cardiologist: Dr. Cherrie   HPI: Ms. 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  EF as low as 30-35% in 2014.   cMRI 08/27/13: EF 32% RV mildly dilated, no scar or infiltrative disease. Mod MR and Mod-sev TR  R/LHC (06/06/14): ectatic coronaries (suspect d/t HTN) no significant arthrosclerosis and normal EF. No significant MR noted  Echo (05/2014): EF 55-60%, mild/mod AI, grade I DD and mild MR  EF stable at 55-60% in 2019.   She was admitted in 06/23 with acute respiratory failure with hypoxia 2/2 acute on chronic systolic CHF. EF down to 30-35% on echo with normal RV, severe BAE, moderate to severe MR, moderate to severe TR and moderate to severe AI. Diuresed and GDMT titrated. R/LHC with no CAD and reduced Fick CI of 1.96 L/min/m2.  Readmit 10/24 with COPD exacerbation in setting of noncompliance with medications and ongoing tobacco use + recent cocaine  use. Repeat echo with EF 40-45%.  Admitted 01/25 with acute respiratory failure 2/2 COPD exacerbation and influenza A. She also had acute on chronic CHF and diuresed with IV lasix .  Admitted in 03/2024 with pneumonia. Echo down 30-35%. Mild MR/ mild AS. She was not discharged on spiro due to elevated potassium.   Today she returns for HF follow up. Overall feeling fatigued, does not sleep well. She has SOB walking further distances on flat ground. Abdomen is tight. Occasional light-headedness and HA. Denies palpitations, abnormal bleeding, CP, or PND/Orthopnea. Appetite ok. Weight at home 160 pounds. Taking all medications. Last used cocaine  2 months ago. No tobacco or ETOH use. Lives with her son. Now  followed by Paramedicine.  FH: Mother deceased: Dementia, seizures, stomach cancer        Father deceased: HTN  Review of systems complete and found to be negative unless listed in HPI.    Past Medical History:  Diagnosis Date   Anemia    Arrhythmia    Asthma    CHF (congestive heart failure) (HCC)    1990s   Chronic headaches    COPD (chronic obstructive pulmonary disease) (HCC)    ??   Hypercholesteremia    Hypertension    Current Outpatient Medications  Medication Sig Dispense Refill   albuterol  (VENTOLIN  HFA) 108 (90 Base) MCG/ACT inhaler Inhale 2 puffs into the lungs every 4 (four) hours as needed for wheezing or shortness of breath. 17 g 1   ammonium lactate  (LAC-HYDRIN ) 12 % lotion Apply 1 Application topically as needed for dry skin. 400 g 5   atorvastatin  (LIPITOR) 40 MG tablet Take 1 tablet (40 mg total) by mouth daily. 90 tablet 1   carvedilol  (COREG ) 3.125 MG tablet Take 1 tablet (3.125 mg total) by mouth 2 (two) times daily with a meal. 60 tablet 3   dapagliflozin  propanediol (FARXIGA ) 10 MG TABS tablet Take 1 tablet (10 mg total) by mouth daily. 90 tablet 1   famotidine  (PEPCID ) 20 MG tablet One after supper 30 tablet 11   fluticasone  furoate-vilanterol (BREO ELLIPTA ) 100-25 MCG/ACT AEPB Inhale 1 puff into the lungs daily. 1 each 11   furosemide  (LASIX ) 40 MG tablet Take 1 tablet (40 mg total) by mouth daily. 30  tablet 3   ipratropium-albuterol  (DUONEB) 0.5-2.5 (3) MG/3ML SOLN Take 3 mLs by nebulization 2 (two) times daily. 180 mL 1   metFORMIN  (GLUCOPHAGE -XR) 500 MG 24 hr tablet Take 1 tablet (500 mg total) by mouth daily. 90 tablet 1   naloxone  (NARCAN ) nasal spray 4 mg/0.1 mL Inject intranasal for overdose 1 each 1   pantoprazole  (PROTONIX ) 40 MG tablet Take 40 mg by mouth daily.     QUEtiapine  (SEROQUEL ) 50 MG tablet Take 1 tablet (50 mg total) by mouth at bedtime. 90 tablet 1   [Paused] spironolactone  (ALDACTONE ) 25 MG tablet TAKE 1 TABLET BY MOUTH ONCE DAILY 30  tablet 10   valsartan  (DIOVAN ) 40 MG tablet Take 1 tablet (40 mg total) by mouth daily. 90 tablet 1   gabapentin  (NEURONTIN ) 300 MG capsule TAKE ONE (1) CAPSULE BY MOUTH TWICE DAILY (Patient not taking: Reported on 06/12/2024) 60 capsule 10   No current facility-administered medications for this encounter.   Wt Readings from Last 3 Encounters:  06/12/24 73.2 kg (161 lb 6.4 oz)  06/06/24 69.4 kg (153 lb)  05/08/24 67.2 kg (148 lb 3.2 oz)    BP 126/82   Pulse 93   Ht 5' 1 (1.549 m)   Wt 73.2 kg (161 lb 6.4 oz)   LMP  (LMP Unknown)   SpO2 99%   BMI 30.50 kg/m   PHYSICAL EXAM: General:  NAD. No resp difficulty, walked into clinic HEENT: Normal Neck: Supple. No JVD. Cor: Regular rate & rhythm. No rubs, gallops or murmurs. Lungs: Clear Abdomen: Soft, nontender, nondistended.  Extremities: No cyanosis, clubbing, rash, edema Neuro: Alert & oriented x 3, moves all 4 extremities w/o difficulty. Affect pleasant.  ReDs reading: 30 %, normal  ASSESSMENT & PLAN:   Chronic Systolic Heart Failure:   - Nonischemic cardiomyopathy.  - EF 30-35% in 2014 - Cath 8/15 showed normal cors  - Echo 6/19: EF 55-65%  - Echo 06/23: EF down to 30-35% on echo with normal RV, severe BAE, moderate to severe MR, moderate to severe TR and moderate to severe AI - Echo 10/24: EF 40-45%, RV okay, PASP 65 mmHg, moderate to severe MR, mild to moderate TR, moderate AI - Echo 5/25: EF 30-35%.  - NYHA III, suspect symptoms related to COPD/asthma. Volume OK, ReDs 30% - Start spiro 12.5 mg daily. - Continue Lasix  40 mg daily. - Continue Coreg  3.125 mg bid. - Continue Farxiga  10 mg daily. - Continue valsartan  40 mg daily. - Labs today, repeat BMET in 1 week - Repeat echo next visit now that she is back on GDMT  2.  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  HTN - BP better controlled - Meds as above  4. Substance abuse - UDS 5/25 + cocaine   - Denies recent drug use.   5. Tobacco Abuse -  Denies smoking  6. SDOH - Now living with her son.  - Now followed by paramedicine for med compliance.  - Gifted bus passes - Discussed having pharmacy deliver meds  Follow up in 3 months with Dr. Cherrie + echo  Harlene HERO [facility] FNP-BC  12:15 PM

## 2024-06-10 NOTE — Telephone Encounter (Signed)
 06/10/2024  Mattie Pyo DOB: May 05, 1966 MRN: [id number]   RIDER WAIVER AND RELEASE OF LIABILITY  For the purposes of helping with transportation needs, [facility] partners with outside transportation providers (taxi companies, [place], Catering manager.) to give [facility] [place] patients or other approved people the choice of on-demand rides Public librarian) to our buildings for non-emergency visits.  By using [employer], I, the person signing this document, on behalf of myself and/or any legal minors (in my care using the [employer]), agree:  Science writer given to me are supplied by independent, outside transportation providers who do not work for, or have any affiliation with, [employer]. [place] is not a transportation company. [facility] has no control over the quality or safety of the rides I get using [employer]. [place] has no control over whether any outside ride will happen on time or not. [place] gives no guarantee on the reliability, quality, safety, or availability on any rides, or that no mistakes will happen. I know and accept that traveling by vehicle (car, truck, SVU, fleeta, bus, taxi, etc.) has risks of serious injuries such as disability, being paralyzed, and death. I know and agree the risk of using [employer] is mine alone, and not [employer]. [employer] are provided as is and as are available. The transportation providers are in charge for all inspections and care of the vehicles used to provide these rides. I agree not to take legal action against [place], its agents, employees, officers, directors, representatives, insurers, attorneys, assigns, successors, subsidiaries, and affiliates at any time for any reasons related directly or indirectly to using [employer]. I also agree not to take legal action against [place] or its affiliates for any injury, death, or damage to property caused by or related to using  [employer]. I have read this Waiver and Release of Liability, and I understand the terms used in it and their legal meaning. This Waiver is freely and voluntarily given with the understanding that my right (or any legal minors) to legal action against [place] relating to [employer] is knowingly given up to use these services.   I attest that I read the Ride Waiver and Release of Liability to Tacarra Smotherman, gave Ms. Clairmont the opportunity to ask questions and answered the questions asked (if any). I affirm that Wilsie Krabill then provided consent for assistance with transportation.     Cyan Clippinger H Audreena Sachdeva

## 2024-06-10 NOTE — Telephone Encounter (Signed)
 H&V Care Navigation CSW Progress Note  Clinical Social Worker received call from pt requesting assistance with ride to appt Wednesday.  CSW called pt insurance but they cannot book ride so close to appt.  CSW arranged taxi to bring to appt on Wednesday.    SDOH Screenings   Food Insecurity: No Food Insecurity (05/06/2024)  Recent Concern: Food Insecurity - Food Insecurity Present (02/28/2024)  Housing: High Risk (05/06/2024)  Transportation Needs: Unmet Transportation Needs (06/10/2024)  Utilities: Not At Risk (05/06/2024)  Recent Concern: Utilities - At Risk (02/28/2024)  Depression (PHQ2-9): High Risk (10/19/2023)  Financial Resource Strain: Low Risk  (05/06/2024)  Physical Activity: Unknown (10/18/2023)  Recent Concern: Physical Activity - Inactive (08/02/2023)  Social Connections: Moderately Integrated (04/05/2024)  Stress: Stress Concern Present (05/06/2024)  Tobacco Use: Medium Risk (06/06/2024)  Health Literacy: Adequate Health Literacy (05/06/2024)   Stacey HILARIO Leech, LCSW Clinical Social Worker Advanced Heart Failure Clinic Desk#: [phone] Cell#: [phone]

## 2024-06-11 ENCOUNTER — Telehealth (HOSPITAL_COMMUNITY): Payer: Self-pay

## 2024-06-11 NOTE — Telephone Encounter (Signed)
 Called to confirm/remind patient of their appointment at the Advanced Heart Failure Clinic on 06/12/24.   Appointment:   [x] Confirmed  [] Left mess   [] No answer/No voice mail  [] VM Full/unable to leave message  [] Phone not in service  Patient reminded to bring all medications and/or complete list.  Confirmed patient has transportation. Gave directions, instructed to utilize valet parking.

## 2024-06-12 ENCOUNTER — Telehealth (HOSPITAL_COMMUNITY): Payer: Self-pay | Admitting: Emergency Medicine

## 2024-06-12 ENCOUNTER — Encounter (HOSPITAL_COMMUNITY): Payer: Self-pay

## 2024-06-12 ENCOUNTER — Ambulatory Visit (HOSPITAL_COMMUNITY): Payer: Self-pay | Admitting: Family Medicine

## 2024-06-12 ENCOUNTER — Other Ambulatory Visit (HOSPITAL_BASED_OUTPATIENT_CLINIC_OR_DEPARTMENT_OTHER): Payer: Self-pay | Admitting: Pharmacist

## 2024-06-12 ENCOUNTER — Encounter: Payer: Self-pay | Admitting: Pharmacist

## 2024-06-12 ENCOUNTER — Ambulatory Visit (HOSPITAL_COMMUNITY)
Admission: RE | Admit: 2024-06-12 | Discharge: 2024-06-12 | Disposition: A | Discharge status: Home / Self Care | Source: Ambulatory Visit | Attending: Family Medicine | Admitting: Family Medicine

## 2024-06-12 VITALS — BP 126/82 | HR 93 | Ht 61.0 in | Wt 161.4 lb

## 2024-06-12 DIAGNOSIS — I110 Hypertensive heart disease with heart failure: Secondary | ICD-10-CM | POA: Diagnosis not present

## 2024-06-12 DIAGNOSIS — I10 Essential (primary) hypertension: Secondary | ICD-10-CM

## 2024-06-12 DIAGNOSIS — Z79899 Other long term (current) drug therapy: Secondary | ICD-10-CM | POA: Insufficient documentation

## 2024-06-12 DIAGNOSIS — Z139 Encounter for screening, unspecified: Secondary | ICD-10-CM | POA: Diagnosis not present

## 2024-06-12 DIAGNOSIS — Z7984 Long term (current) use of oral hypoglycemic drugs: Secondary | ICD-10-CM | POA: Insufficient documentation

## 2024-06-12 DIAGNOSIS — I428 Other cardiomyopathies: Secondary | ICD-10-CM | POA: Insufficient documentation

## 2024-06-12 DIAGNOSIS — I5022 Chronic systolic (congestive) heart failure: Secondary | ICD-10-CM | POA: Insufficient documentation

## 2024-06-12 DIAGNOSIS — I083 Combined rheumatic disorders of mitral, aortic and tricuspid valves: Secondary | ICD-10-CM | POA: Diagnosis not present

## 2024-06-12 DIAGNOSIS — F1910 Other psychoactive substance abuse, uncomplicated: Secondary | ICD-10-CM

## 2024-06-12 DIAGNOSIS — I38 Endocarditis, valve unspecified: Secondary | ICD-10-CM | POA: Diagnosis not present

## 2024-06-12 DIAGNOSIS — J449 Chronic obstructive pulmonary disease, unspecified: Secondary | ICD-10-CM | POA: Insufficient documentation

## 2024-06-12 LAB — BASIC METABOLIC PANEL WITH GFR
Anion gap: 9 (ref 5–15)
BUN: 24 mg/dL — ABNORMAL HIGH (ref 6–20)
CO2: 27 mmol/L (ref 22–32)
Calcium: 9.3 mg/dL (ref 8.9–10.3)
Chloride: 104 mmol/L (ref 98–111)
Creatinine, Ser: 1.11 mg/dL — ABNORMAL HIGH (ref 0.44–1.00)
GFR, Estimated: 57 mL/min — ABNORMAL LOW (ref >60)
Glucose, Bld: 131 mg/dL — ABNORMAL HIGH (ref 70–99)
Potassium: 4.5 mmol/L (ref 3.5–5.1)
Sodium: 140 mmol/L (ref 135–145)

## 2024-06-12 MED ORDER — SPIRONOLACTONE 25 MG PO TABS: 12.5000 mg | ORAL_TABLET | Freq: Every day | ORAL | 3 refills | Status: DC

## 2024-06-12 NOTE — Progress Notes (Signed)
 ReDS Vest / Clip - 06/12/24 1206       ReDS Vest / Clip   Station Marker B    Ruler Value 28    ReDS Value Range Low volume    ReDS Actual Value 30

## 2024-06-12 NOTE — Patient Instructions (Signed)
 Start Spiro 12.5 mg daily. Labs today - will call you if abnormal. Repeat labs in one week - see below. Return to see Dr. Cherrie with echo in 3 months.  PLEASE CALL US  AT [phone] IN OCTOBER TO SCHEDULE THIS APPOINTMENT. Please call us  at [phone] if any questions or concerns prior to your next appointment.

## 2024-06-12 NOTE — Telephone Encounter (Signed)
 Called w/ no answer to f/u on today's H&V Clinic visit.  LVM regarding Spironolactone  being added to her medication profile.  Asked for her to return my call and advise if she was able to pick up her new prescription and want to set up a home visit.    Mary Sharps, EMT-Paramedic [phone] 06/12/2024

## 2024-06-12 NOTE — Progress Notes (Signed)
 Pharmacy Quality Measure Review  This patient is appearing on a report for being at risk of failing the adherence measure for cholesterol (statin) and diabetes medications this calendar year.   Medication: atorvastatin  Last fill date: 06/06/2024 for 30 day supply  Medication: dapagliflozin   Last fill date: 05/03/2024 for 60 day supply  Medication: metformin  Last fill date: 06/06/2024 for 30 day supply  Medication: valsartan  Last fill date: 05/16/2024 for 90 day supply.   Refills were picked-up last week for those that were due. Reminder set for next fills.   Herlene Fleeta Morris, PharmD, JAQUELINE, CPP Clinical Pharmacist [facility] [phone]

## 2024-06-13 ENCOUNTER — Other Ambulatory Visit (HOSPITAL_COMMUNITY): Payer: Self-pay | Admitting: Emergency Medicine

## 2024-06-13 NOTE — Progress Notes (Signed)
 Paramedicine Encounter    Patient ID: Stacey Lin, female    DOB: May 07, 1966, 58 y.o.   MRN: [id number]   Complaints NONE  Assessment A&O x 4, skin W&D w/ good color.  Denies chest pain or SOB. Lung sounds with some mild wheezing noted.   No peripheral edema noted.  Compliance with meds No  Pill box filled x 2 weeks  Refills needed Ipratroprium Bromide and Albuterol , Pantoprazole , Farxiga , Furosemide   Meds changes since last visit NONE    Social changes NONE  Weight yesterday- Last visit weight-  Picked up Spironolactone , Carvedilol , Breo Elipta from [facility] on [address] on 8/20 for todays visit.   ATF Pt A&O x 4, skin W&D w/ good color.  Denies chest pain or SOB.  Lung sounds w/ some wheezing throughout.  She has not done a breathing treatment today.  She states she has been taking a nap and just hasn't taken the time.  I recommended going ahead and doing a treatment after our visit today and she advised she would do so. Med box reconciled x 2 weeks. It has been difficult to get up with her for home visits as she has been staying with her daughter who lives in [place].  Next home visit 8/27 @ 12:15.  ACTION: Home visit completed  [name] [phone] 06/13/24  Patient Care Team: Celestia [name] SQUIBB, NP as PCP - General (Internal Medicine)  Patient Active Problem List   Diagnosis Date Noted   Low blood pressure, not hypotension (required pausing some of her GDMT) 04/18/2024   Pruritic intertrigo 04/18/2024   Hot flashes due to menopause 04/18/2024   Constipation 04/18/2024   Acute respiratory distress 04/05/2024   Lumbar radiculopathy 03/13/2024   CHF (congestive heart failure) (HCC) 02/29/2024   COPD with acute exacerbation (HCC) 02/28/2024   Respiratory failure with hypoxia (HCC) 11/21/2023   COPD exacerbation (HCC) 07/25/2023   Nonrheumatic aortic valve insufficiency    Elevated LFTs 04/08/2022   Acute metabolic encephalopathy 04/08/2022    Depressed mood 04/08/2022   Headache 04/07/2022   Obesity (BMI 30-39.9) 04/07/2022   Acute exacerbation of CHF (congestive heart failure) (HCC) 04/06/2022   Polysubstance abuse (HCC) 04/06/2022   Elevated troponin 04/06/2022   Controlled type 2 diabetes mellitus with hyperglycemia (HCC) 04/06/2022   Transaminitis 04/06/2022   Hypoalbuminemia 04/06/2022   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/11/2021   Upper airway cough syndrome 12/20/2018   Morbid obesity due to excess calories (HCC) 12/04/2018   Cigarette smoker 11/21/2018   Asthmatic bronchitis, moderate persistent, uncomplicated 11/20/2018   DOE (dyspnea on exertion) 04/14/2016   Streptococcus pneumoniae pneumonia (HCC) 04/07/2016   Acute respiratory failure with hypoxia (HCC) 04/03/2016   Elevated lactic acid level 04/03/2016   Leukocytosis 04/03/2016   New onset type 2 diabetes mellitus (HCC) 04/03/2016   Abnormal urinalysis 04/03/2016   Trichomonas infection 04/03/2016   Non compliance w medication regimen 01/12/2016   COPD without exacerbation (HCC) 12/04/2014   AKI (acute kidney injury) (HCC) 03/14/2014   Acute on chronic systolic heart failure (HCC) 03/14/2014   Musculoskeletal chest pain 03/14/2014   Dyspnea 12/03/2013   Periodic health assessment, general screening, adult 10/03/2013   Mitral valve regurgitation 08/22/2013   FIBROIDS, UTERUS 05/11/2010   ANEMIA, SECONDARY TO BLOOD LOSS 05/11/2010   Asthmatic bronchitis 05/11/2010   TOBACCO USER 07/07/2009   Essential hypertension, benign 06/19/2009   MENORRHAGIA 06/19/2009   COCAINE  ABUSE, HX OF 06/19/2009    Current Outpatient Medications:    albuterol  (  VENTOLIN  HFA) 108 (90 Base) MCG/ACT inhaler, Inhale 2 puffs into the lungs every 4 (four) hours as needed for wheezing or shortness of breath., Disp: 17 g, Rfl: 1   ammonium lactate  (LAC-HYDRIN ) 12 % lotion, Apply 1 Application topically as needed for dry skin., Disp: 400 g, Rfl: 5   atorvastatin  (LIPITOR)  40 MG tablet, Take 1 tablet (40 mg total) by mouth daily., Disp: 90 tablet, Rfl: 1   carvedilol  (COREG ) 3.125 MG tablet, Take 1 tablet (3.125 mg total) by mouth 2 (two) times daily with a meal., Disp: 60 tablet, Rfl: 3   dapagliflozin  propanediol (FARXIGA ) 10 MG TABS tablet, Take 1 tablet (10 mg total) by mouth daily., Disp: 90 tablet, Rfl: 1   famotidine  (PEPCID ) 20 MG tablet, One after supper, Disp: 30 tablet, Rfl: 11   fluticasone  furoate-vilanterol (BREO ELLIPTA ) 100-25 MCG/ACT AEPB, Inhale 1 puff into the lungs daily., Disp: 1 each, Rfl: 11   furosemide  (LASIX ) 40 MG tablet, Take 1 tablet (40 mg total) by mouth daily., Disp: 30 tablet, Rfl: 3   gabapentin  (NEURONTIN ) 300 MG capsule, TAKE ONE (1) CAPSULE BY MOUTH TWICE DAILY (Patient not taking: Reported on 06/12/2024), Disp: 60 capsule, Rfl: 10   ipratropium-albuterol  (DUONEB) 0.5-2.5 (3) MG/3ML SOLN, Take 3 mLs by nebulization 2 (two) times daily., Disp: 180 mL, Rfl: 1   metFORMIN  (GLUCOPHAGE -XR) 500 MG 24 hr tablet, Take 1 tablet (500 mg total) by mouth daily., Disp: 90 tablet, Rfl: 1   naloxone  (NARCAN ) nasal spray 4 mg/0.1 mL, Inject intranasal for overdose, Disp: 1 each, Rfl: 1   pantoprazole  (PROTONIX ) 40 MG tablet, Take 40 mg by mouth daily., Disp: , Rfl:    QUEtiapine  (SEROQUEL ) 50 MG tablet, Take 1 tablet (50 mg total) by mouth at bedtime., Disp: 90 tablet, Rfl: 1   spironolactone  (ALDACTONE ) 25 MG tablet, Take 0.5 tablets (12.5 mg total) by mouth daily., Disp: 45 tablet, Rfl: 3   valsartan  (DIOVAN ) 40 MG tablet, Take 1 tablet (40 mg total) by mouth daily., Disp: 90 tablet, Rfl: 1 No Known Allergies   Social History   Socioeconomic History   Marital status: Divorced    Spouse name: Not on file   Number of children: Not on file   Years of education: Not on file   Highest education level: Some college, no degree  Occupational History   Not on file  Tobacco Use   Smoking status: Former    Current packs/day: 0.00    Average  packs/day: 1 pack/day for 35.0 years (35.0 ttl pk-yrs)    Types: Cigarettes    Start date: 08/25/1984    Quit date: 08/26/2019    Years since quitting: 4.8   Smokeless tobacco: Never  Vaping Use   Vaping status: Former  Substance and Sexual Activity   Alcohol use: No    Alcohol/week: 0.0 standard drinks of alcohol   Drug use: Not Currently    Frequency: 4.0 times per week    Types: Crack cocaine     Comment: RELAPSE 12/2016   Sexual activity: Not on file  Other Topics Concern   Not on file  Social History Narrative   Lives with son in an apartment on the third floor.  Does not work.  On disability.  Previously worked in [employer].    Education: high school.     Social Drivers of Corporate investment banker Strain: Low Risk  (05/06/2024)   Overall Financial Resource Strain (CARDIA)    Difficulty of Paying Living  Expenses: Not very hard  Food Insecurity: No Food Insecurity (05/06/2024)   Hunger Vital Sign    Worried About Running Out of Food in the Last Year: Never true    Ran Out of Food in the Last Year: Never true  Recent Concern: Food Insecurity - Food Insecurity Present (02/28/2024)   Hunger Vital Sign    Worried About Running Out of Food in the Last Year: Sometimes true    Ran Out of Food in the Last Year: Sometimes true  Transportation Needs: Unmet Transportation Needs (06/10/2024)   PRAPARE - Administrator, Civil Service (Medical): Yes    Lack of Transportation (Non-Medical): Yes  Physical Activity: Unknown (10/18/2023)   Exercise Vital Sign    Days of Exercise per Week: 2 days    Minutes of Exercise per Session: Patient declined  Recent Concern: Physical Activity - Inactive (08/02/2023)   Exercise Vital Sign    Days of Exercise per Week: 1 day    Minutes of Exercise per Session: 0 min  Stress: Stress Concern Present (05/06/2024)   [employer] of Occupational Health - Occupational Stress Questionnaire    Feeling of Stress: To some extent  Social  Connections: Moderately Integrated (04/05/2024)   Social Connection and Isolation Panel    Frequency of Communication with Friends and Family: More than three times a week    Frequency of Social Gatherings with Friends and Family: More than three times a week    Attends Religious Services: More than 4 times per year    Active Member of Clubs or Organizations: Yes    Attends Banker Meetings: More than 4 times per year    Marital Status: Never married  Intimate Partner Violence: Not At Risk (04/05/2024)   Humiliation, Afraid, Rape, and Kick questionnaire    Fear of Current or Ex-Partner: No    Emotionally Abused: No    Physically Abused: No    Sexually Abused: No    Physical Exam      Future Appointments  Date Time Provider Department Center  06/20/2024 11:30 AM [facility] None  07/02/2024  3:15 PM Gaynel Delon CROME, DPM [facility]  07/18/2024  1:30 PM Celestia [name] SQUIBB, NP [facility] None  07/24/2024  1:15 PM Darlean Ozell NOVAK, MD [facility] None

## 2024-06-19 ENCOUNTER — Other Ambulatory Visit (HOSPITAL_COMMUNITY): Payer: Self-pay

## 2024-06-19 ENCOUNTER — Other Ambulatory Visit (HOSPITAL_COMMUNITY): Payer: Self-pay | Admitting: Emergency Medicine

## 2024-06-19 NOTE — Progress Notes (Unsigned)
 Paramedicine Encounter    Patient ID: Stacey Lin, female    DOB: 02-05-1966, 58 y.o.   MRN: [id number]   Complaints***  Assessment***  Compliance with meds***  Pill box filled***  Refills needed furosemide , pantoprazole , valsartan   Meds changes since last visit***    Social changes***   LMP  (LMP Unknown)  Weight yesterday-*** Last visit weight-***  ACTION: {Paramed Action:[id number]}  Mary Sharps, EMT-Paramedic [phone] 06/19/24  Patient Care Team: Celestia [name] SQUIBB, NP as PCP - General (Internal Medicine)  Patient Active Problem List   Diagnosis Date Noted  . Low blood pressure, not hypotension (required pausing some of her GDMT) 04/18/2024  . Pruritic intertrigo 04/18/2024  . Hot flashes due to menopause 04/18/2024  . Constipation 04/18/2024  . Acute respiratory distress 04/05/2024  . Lumbar radiculopathy 03/13/2024  . CHF (congestive heart failure) (HCC) 02/29/2024  . COPD with acute exacerbation (HCC) 02/28/2024  . Respiratory failure with hypoxia (HCC) 11/21/2023  . COPD exacerbation (HCC) 07/25/2023  . Nonrheumatic aortic valve insufficiency   . Elevated LFTs 04/08/2022  . Acute metabolic encephalopathy 04/08/2022  . Depressed mood 04/08/2022  . Headache 04/07/2022  . Obesity (BMI 30-39.9) 04/07/2022  . Acute exacerbation of CHF (congestive heart failure) (HCC) 04/06/2022  . Polysubstance abuse (HCC) 04/06/2022  . Elevated troponin 04/06/2022  . Controlled type 2 diabetes mellitus with hyperglycemia (HCC) 04/06/2022  . Transaminitis 04/06/2022  . Hypoalbuminemia 04/06/2022  . 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/11/2021  . Upper airway cough syndrome 12/20/2018  . Morbid obesity due to excess calories (HCC) 12/04/2018  . Cigarette smoker 11/21/2018  . Asthmatic bronchitis, moderate persistent, uncomplicated 11/20/2018  . DOE (dyspnea on exertion) 04/14/2016  . Streptococcus pneumoniae pneumonia (HCC) 04/07/2016  . Acute  respiratory failure with hypoxia (HCC) 04/03/2016  . Elevated lactic acid level 04/03/2016  . Leukocytosis 04/03/2016  . New onset type 2 diabetes mellitus (HCC) 04/03/2016  . Abnormal urinalysis 04/03/2016  . Trichomonas infection 04/03/2016  . Non compliance w medication regimen 01/12/2016  . COPD without exacerbation (HCC) 12/04/2014  . AKI (acute kidney injury) (HCC) 03/14/2014  . Acute on chronic systolic heart failure (HCC) 03/14/2014  . Musculoskeletal chest pain 03/14/2014  . Dyspnea 12/03/2013  . Periodic health assessment, general screening, adult 10/03/2013  . Mitral valve regurgitation 08/22/2013  . FIBROIDS, UTERUS 05/11/2010  . ANEMIA, SECONDARY TO BLOOD LOSS 05/11/2010  . Asthmatic bronchitis 05/11/2010  . TOBACCO USER 07/07/2009  . Essential hypertension, benign 06/19/2009  . MENORRHAGIA 06/19/2009  . COCAINE  ABUSE, HX OF 06/19/2009    Current Outpatient Medications:  .  albuterol  (VENTOLIN  HFA) 108 (90 Base) MCG/ACT inhaler, Inhale 2 puffs into the lungs every 4 (four) hours as needed for wheezing or shortness of breath., Disp: 17 g, Rfl: 1 .  ammonium lactate  (LAC-HYDRIN ) 12 % lotion, Apply 1 Application topically as needed for dry skin., Disp: 400 g, Rfl: 5 .  atorvastatin  (LIPITOR) 40 MG tablet, Take 1 tablet (40 mg total) by mouth daily., Disp: 90 tablet, Rfl: 1 .  carvedilol  (COREG ) 3.125 MG tablet, Take 1 tablet (3.125 mg total) by mouth 2 (two) times daily with a meal., Disp: 60 tablet, Rfl: 3 .  dapagliflozin  propanediol (FARXIGA ) 10 MG TABS tablet, Take 1 tablet (10 mg total) by mouth daily., Disp: 90 tablet, Rfl: 1 .  famotidine  (PEPCID ) 20 MG tablet, One after supper (Patient not taking: Reported on 06/13/2024), Disp: 30 tablet, Rfl: 11 .  fluticasone  furoate-vilanterol (BREO ELLIPTA ) 100-25 MCG/ACT AEPB, Inhale 1 puff  into the lungs daily., Disp: 1 each, Rfl: 11 .  furosemide  (LASIX ) 40 MG tablet, Take 1 tablet (40 mg total) by mouth daily., Disp: 30 tablet,  Rfl: 3 .  gabapentin  (NEURONTIN ) 300 MG capsule, TAKE ONE (1) CAPSULE BY MOUTH TWICE DAILY (Patient not taking: Reported on 06/13/2024), Disp: 60 capsule, Rfl: 10 .  ipratropium-albuterol  (DUONEB) 0.5-2.5 (3) MG/3ML SOLN, Take 3 mLs by nebulization 2 (two) times daily., Disp: 180 mL, Rfl: 1 .  metFORMIN  (GLUCOPHAGE -XR) 500 MG 24 hr tablet, Take 1 tablet (500 mg total) by mouth daily., Disp: 90 tablet, Rfl: 1 .  naloxone  (NARCAN ) nasal spray 4 mg/0.1 mL, Inject intranasal for overdose (Patient taking differently: Inject intranasal for overdose), Disp: 1 each, Rfl: 1 .  pantoprazole  (PROTONIX ) 40 MG tablet, Take 40 mg by mouth daily., Disp: , Rfl:  .  QUEtiapine  (SEROQUEL ) 50 MG tablet, Take 1 tablet (50 mg total) by mouth at bedtime., Disp: 90 tablet, Rfl: 1 .  spironolactone  (ALDACTONE ) 25 MG tablet, Take 0.5 tablets (12.5 mg total) by mouth daily., Disp: 45 tablet, Rfl: 3 .  valsartan  (DIOVAN ) 40 MG tablet, Take 1 tablet (40 mg total) by mouth daily., Disp: 90 tablet, Rfl: 1 No Known Allergies   Social History   Socioeconomic History  . Marital status: Divorced    Spouse name: Not on file  . Number of children: Not on file  . Years of education: Not on file  . Highest education level: Some college, no degree  Occupational History  . Not on file  Tobacco Use  . Smoking status: Former    Current packs/day: 0.00    Average packs/day: 1 pack/day for 35.0 years (35.0 ttl pk-yrs)    Types: Cigarettes    Start date: 08/25/1984    Quit date: 08/26/2019    Years since quitting: 4.8  . Smokeless tobacco: Never  Vaping Use  . Vaping status: Former  Substance and Sexual Activity  . Alcohol use: No    Alcohol/week: 0.0 standard drinks of alcohol  . Drug use: Not Currently    Frequency: 4.0 times per week    Types: Crack cocaine     Comment: RELAPSE 12/2016  . Sexual activity: Not on file  Other Topics Concern  . Not on file  Social History Narrative   Lives with son in an apartment on  the third floor.  Does not work.  On disability.  Previously worked in [employer].    Education: high school.     Social Drivers of Health   Financial Resource Strain: Low Risk  (05/06/2024)   Overall Financial Resource Strain (CARDIA)   . Difficulty of Paying Living Expenses: Not very hard  Food Insecurity: No Food Insecurity (05/06/2024)   Hunger Vital Sign   . Worried About Programme researcher, broadcasting/film/video in the Last Year: Never true   . Ran Out of Food in the Last Year: Never true  Recent Concern: Food Insecurity - Food Insecurity Present (02/28/2024)   Hunger Vital Sign   . Worried About Programme researcher, broadcasting/film/video in the Last Year: Sometimes true   . Ran Out of Food in the Last Year: Sometimes true  Transportation Needs: Unmet Transportation Needs (06/10/2024)   PRAPARE - Transportation   . Lack of Transportation (Medical): Yes   . Lack of Transportation (Non-Medical): Yes  Physical Activity: Unknown (10/18/2023)   Exercise Vital Sign   . Days of Exercise per Week: 2 days   . Minutes of Exercise per Session:  Patient declined  Recent Concern: Physical Activity - Inactive (08/02/2023)   Exercise Vital Sign   . Days of Exercise per Week: 1 day   . Minutes of Exercise per Session: 0 min  Stress: Stress Concern Present (05/06/2024)   [employer] of Occupational Health - Occupational Stress Questionnaire   . Feeling of Stress: To some extent  Social Connections: Moderately Integrated (04/05/2024)   Social Connection and Isolation Panel   . Frequency of Communication with Friends and Family: More than three times a week   . Frequency of Social Gatherings with Friends and Family: More than three times a week   . Attends Religious Services: More than 4 times per year   . Active Member of Clubs or Organizations: Yes   . Attends Banker Meetings: More than 4 times per year   . Marital Status: Never married  Intimate Partner Violence: Not At Risk (04/05/2024)   Humiliation, Afraid, Rape, and  Kick questionnaire   . Fear of Current or Ex-Partner: No   . Emotionally Abused: No   . Physically Abused: No   . Sexually Abused: No    Physical Exam      Future Appointments  Date Time Provider Department Center  06/20/2024 11:30 AM [facility] None  07/02/2024  3:15 PM Gaynel Delon CROME, DPM [facility]  07/18/2024  1:30 PM Celestia [name] SQUIBB, NP [facility] [place]  07/24/2024  1:15 PM Darlean Ozell NOVAK, MD [facility] [phone] LELON Das

## 2024-06-20 ENCOUNTER — Ambulatory Visit (HOSPITAL_COMMUNITY)
Admission: RE | Admit: 2024-06-20 | Discharge: 2024-06-20 | Disposition: A | Discharge status: Home / Self Care | Source: Ambulatory Visit | Attending: Cardiology | Admitting: Cardiology

## 2024-06-20 DIAGNOSIS — I5022 Chronic systolic (congestive) heart failure: Secondary | ICD-10-CM | POA: Insufficient documentation

## 2024-06-20 LAB — BASIC METABOLIC PANEL WITH GFR
Anion gap: 9 (ref 5–15)
BUN: 17 mg/dL (ref 6–20)
CO2: 28 mmol/L (ref 22–32)
Calcium: 9.3 mg/dL (ref 8.9–10.3)
Chloride: 101 mmol/L (ref 98–111)
Creatinine, Ser: 0.98 mg/dL (ref 0.44–1.00)
GFR, Estimated: >60 mL/min (ref >60)
Glucose, Bld: 139 mg/dL — ABNORMAL HIGH (ref 70–99)
Potassium: 4.3 mmol/L (ref 3.5–5.1)
Sodium: 138 mmol/L (ref 135–145)

## 2024-06-21 DIAGNOSIS — R531 Weakness: Secondary | ICD-10-CM | POA: Diagnosis not present

## 2024-06-21 DIAGNOSIS — M109 Gout, unspecified: Secondary | ICD-10-CM | POA: Diagnosis not present

## 2024-06-21 DIAGNOSIS — R0789 Other chest pain: Secondary | ICD-10-CM | POA: Diagnosis not present

## 2024-06-21 DIAGNOSIS — D539 Nutritional anemia, unspecified: Secondary | ICD-10-CM | POA: Diagnosis not present

## 2024-06-21 DIAGNOSIS — M1A09X0 Idiopathic chronic gout, multiple sites, without tophus (tophi): Secondary | ICD-10-CM | POA: Diagnosis not present

## 2024-06-21 DIAGNOSIS — I10 Essential (primary) hypertension: Secondary | ICD-10-CM | POA: Diagnosis not present

## 2024-06-21 DIAGNOSIS — E119 Type 2 diabetes mellitus without complications: Secondary | ICD-10-CM | POA: Diagnosis not present

## 2024-06-21 DIAGNOSIS — J449 Chronic obstructive pulmonary disease, unspecified: Secondary | ICD-10-CM | POA: Diagnosis not present

## 2024-06-21 DIAGNOSIS — Z79899 Other long term (current) drug therapy: Secondary | ICD-10-CM | POA: Diagnosis not present

## 2024-06-21 DIAGNOSIS — M4696 Unspecified inflammatory spondylopathy, lumbar region: Secondary | ICD-10-CM | POA: Diagnosis not present

## 2024-06-26 ENCOUNTER — Encounter (INDEPENDENT_AMBULATORY_CARE_PROVIDER_SITE_OTHER): Payer: Self-pay

## 2024-06-26 ENCOUNTER — Other Ambulatory Visit (HOSPITAL_COMMUNITY): Payer: Self-pay | Admitting: Emergency Medicine

## 2024-06-26 NOTE — Progress Notes (Signed)
 Paramedicine Encounter    Patient ID: Stacey Lin, female    DOB: [birth year], 58 y.o.   MRN: [id number]   Complaints NONE  Assessment A&O x 4, skin W&D w/ good color.  She denies chest pain, she does become SOB w/ exertion which she says is baseline for her.  No peripheral edema noted.  She has 2 med boxes a large one that she keeps at home and a smaller box that she takes with her should she travel to her daughters or goes to stay w/ her brother in [place].  She says she tries not to stay with her brother because it puts her in a position to hang out with friends that tempted to use drugs.  At this time she says she's still clean.  Compliance with meds YES  Pill box filled x 2 week  Refills needed Pantoprazole   Meds changes since last visit NONE    Social changes NONE   BP 112/68 (BP Location: Right Arm, Patient Position: Sitting, Cuff Size: Normal)   Pulse 80   Resp 16   Wt 169 lb 12.8 oz (77 kg)   LMP  (LMP Unknown)   BMI 32.08 kg/m  Weight yesterday- Last visit weight-161lb  ACTION: Home visit completed  [name] [phone] 06/26/24  Patient Care Team: Celestia [name] SQUIBB, NP as PCP - General (Internal Medicine)  Patient Active Problem List   Diagnosis Date Noted   Low blood pressure, not hypotension (required pausing some of her GDMT) 04/18/2024   Pruritic intertrigo 04/18/2024   Hot flashes due to menopause 04/18/2024   Constipation 04/18/2024   Acute respiratory distress 04/05/2024   Lumbar radiculopathy 03/13/2024   CHF (congestive heart failure) (HCC) 02/29/2024   COPD with acute exacerbation (HCC) 02/28/2024   Respiratory failure with hypoxia (HCC) 11/21/2023   COPD exacerbation (HCC) 07/25/2023   Nonrheumatic aortic valve insufficiency    Elevated LFTs 04/08/2022   Acute metabolic encephalopathy 04/08/2022   Depressed mood 04/08/2022   Headache 04/07/2022   Obesity (BMI 30-39.9) 04/07/2022   Acute exacerbation of CHF  (congestive heart failure) (HCC) 04/06/2022   Polysubstance abuse (HCC) 04/06/2022   Elevated troponin 04/06/2022   Controlled type 2 diabetes mellitus with hyperglycemia (HCC) 04/06/2022   Transaminitis 04/06/2022   Hypoalbuminemia 04/06/2022   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/11/2021   Upper airway cough syndrome 12/20/2018   Morbid obesity due to excess calories (HCC) 12/04/2018   Cigarette smoker 11/21/2018   Asthmatic bronchitis, moderate persistent, uncomplicated 11/20/2018   DOE (dyspnea on exertion) 04/14/2016   Streptococcus pneumoniae pneumonia (HCC) 04/07/2016   Acute respiratory failure with hypoxia (HCC) 04/03/2016   Elevated lactic acid level 04/03/2016   Leukocytosis 04/03/2016   New onset type 2 diabetes mellitus (HCC) 04/03/2016   Abnormal urinalysis 04/03/2016   Trichomonas infection 04/03/2016   Non compliance w medication regimen 01/12/2016   COPD without exacerbation (HCC) 12/04/2014   AKI (acute kidney injury) (HCC) 03/14/2014   Acute on chronic systolic heart failure (HCC) 03/14/2014   Musculoskeletal chest pain 03/14/2014   Dyspnea 12/03/2013   Periodic health assessment, general screening, adult 10/03/2013   Mitral valve regurgitation 08/22/2013   FIBROIDS, UTERUS 05/11/2010   ANEMIA, SECONDARY TO BLOOD LOSS 05/11/2010   Asthmatic bronchitis 05/11/2010   TOBACCO USER 07/07/2009   Essential hypertension, benign 06/19/2009   MENORRHAGIA 06/19/2009   COCAINE  ABUSE, HX OF 06/19/2009    Current Outpatient Medications:    albuterol  (VENTOLIN  HFA) 108 (90 Base) MCG/ACT inhaler, Inhale  2 puffs into the lungs every 4 (four) hours as needed for wheezing or shortness of breath., Disp: 17 g, Rfl: 1   ammonium lactate  (LAC-HYDRIN ) 12 % lotion, Apply 1 Application topically as needed for dry skin., Disp: 400 g, Rfl: 5   atorvastatin  (LIPITOR) 40 MG tablet, Take 1 tablet (40 mg total) by mouth daily., Disp: 90 tablet, Rfl: 1   carvedilol  (COREG ) 3.125 MG  tablet, Take 1 tablet (3.125 mg total) by mouth 2 (two) times daily with a meal., Disp: 60 tablet, Rfl: 3   dapagliflozin  propanediol (FARXIGA ) 10 MG TABS tablet, Take 1 tablet (10 mg total) by mouth daily., Disp: 90 tablet, Rfl: 1   fluticasone  furoate-vilanterol (BREO ELLIPTA ) 100-25 MCG/ACT AEPB, Inhale 1 puff into the lungs daily., Disp: 1 each, Rfl: 11   furosemide  (LASIX ) 40 MG tablet, Take 1 tablet (40 mg total) by mouth daily., Disp: 30 tablet, Rfl: 3   ipratropium-albuterol  (DUONEB) 0.5-2.5 (3) MG/3ML SOLN, Take 3 mLs by nebulization 2 (two) times daily., Disp: 180 mL, Rfl: 1   metFORMIN  (GLUCOPHAGE -XR) 500 MG 24 hr tablet, Take 1 tablet (500 mg total) by mouth daily., Disp: 90 tablet, Rfl: 1   naloxone  (NARCAN ) nasal spray 4 mg/0.1 mL, Inject intranasal for overdose (Patient taking differently: Inject intranasal for overdose), Disp: 1 each, Rfl: 1   pantoprazole  (PROTONIX ) 40 MG tablet, Take 40 mg by mouth daily., Disp: , Rfl:    QUEtiapine  (SEROQUEL ) 50 MG tablet, Take 1 tablet (50 mg total) by mouth at bedtime., Disp: 90 tablet, Rfl: 1   spironolactone  (ALDACTONE ) 25 MG tablet, Take 0.5 tablets (12.5 mg total) by mouth daily., Disp: 45 tablet, Rfl: 3   valsartan  (DIOVAN ) 40 MG tablet, Take 1 tablet (40 mg total) by mouth daily., Disp: 90 tablet, Rfl: 1   famotidine  (PEPCID ) 20 MG tablet, One after supper (Patient not taking: Reported on 06/26/2024), Disp: 30 tablet, Rfl: 11   gabapentin  (NEURONTIN ) 300 MG capsule, TAKE ONE (1) CAPSULE BY MOUTH TWICE DAILY (Patient not taking: Reported on 06/26/2024), Disp: 60 capsule, Rfl: 10 No Known Allergies   Social History   Socioeconomic History   Marital status: Divorced    Spouse name: Not on file   Number of children: Not on file   Years of education: Not on file   Highest education level: Some college, no degree  Occupational History   Not on file  Tobacco Use   Smoking status: Former    Current packs/day: 0.00    Average packs/day: 1  pack/day for 35.0 years (35.0 ttl pk-yrs)    Types: Cigarettes    Start date: 08/25/1984    Quit date: 08/26/2019    Years since quitting: 4.8   Smokeless tobacco: Never  Vaping Use   Vaping status: Former  Substance and Sexual Activity   Alcohol use: No    Alcohol/week: 0.0 standard drinks of alcohol   Drug use: Not Currently    Frequency: 4.0 times per week    Types: Crack cocaine     Comment: RELAPSE 12/2016   Sexual activity: Not on file  Other Topics Concern   Not on file  Social History Narrative   Lives with son in an apartment on the third floor.  Does not work.  On disability.  Previously worked in [employer].    Education: high school.     Social Drivers of Health   Financial Resource Strain: Low Risk  (05/06/2024)   Overall Financial Resource Strain (CARDIA)  Difficulty of Paying Living Expenses: Not very hard  Food Insecurity: No Food Insecurity (05/06/2024)   Hunger Vital Sign    Worried About Running Out of Food in the Last Year: Never true    Ran Out of Food in the Last Year: Never true  Recent Concern: Food Insecurity - Food Insecurity Present (02/28/2024)   Hunger Vital Sign    Worried About Running Out of Food in the Last Year: Sometimes true    Ran Out of Food in the Last Year: Sometimes true  Transportation Needs: Unmet Transportation Needs (06/10/2024)   PRAPARE - Administrator, Civil Service (Medical): Yes    Lack of Transportation (Non-Medical): Yes  Physical Activity: Unknown (10/18/2023)   Exercise Vital Sign    Days of Exercise per Week: 2 days    Minutes of Exercise per Session: Patient declined  Recent Concern: Physical Activity - Inactive (08/02/2023)   Exercise Vital Sign    Days of Exercise per Week: 1 day    Minutes of Exercise per Session: 0 min  Stress: Stress Concern Present (05/06/2024)   [employer] of Occupational Health - Occupational Stress Questionnaire    Feeling of Stress: To some extent  Social Connections:  Moderately Integrated (04/05/2024)   Social Connection and Isolation Panel    Frequency of Communication with Friends and Family: More than three times a week    Frequency of Social Gatherings with Friends and Family: More than three times a week    Attends Religious Services: More than 4 times per year    Active Member of Clubs or Organizations: Yes    Attends Banker Meetings: More than 4 times per year    Marital Status: Never married  Intimate Partner Violence: Not At Risk (04/05/2024)   Humiliation, Afraid, Rape, and Kick questionnaire    Fear of Current or Ex-Partner: No    Emotionally Abused: No    Physically Abused: No    Sexually Abused: No    Physical Exam      Future Appointments  Date Time Provider Department Center  07/02/2024  3:15 PM Gaynel Delon CROME, DPM [facility]  07/18/2024  1:30 PM Celestia [name] SQUIBB, NP [facility] [place]  07/24/2024  1:15 PM Darlean Ozell NOVAK, MD [facility] [address]  08/27/2024  1:40 PM [facility] [place]

## 2024-06-27 ENCOUNTER — Other Ambulatory Visit (HOSPITAL_COMMUNITY): Payer: Self-pay | Admitting: Emergency Medicine

## 2024-06-27 ENCOUNTER — Telehealth (HOSPITAL_COMMUNITY): Payer: Self-pay | Admitting: Emergency Medicine

## 2024-06-27 NOTE — Telephone Encounter (Signed)
   Please advise to stop drinking gatorade.   Increase lasix  80 mg daily x 3 days then back to lasix  40 mg daily  Ananth Fiallos NP-C  .11:34 AM

## 2024-06-27 NOTE — Progress Notes (Signed)
 Met pt. @ 3:45 in [employer] [facility] on [address] in [place] and gave her a cane that she had requested and adjusted it for her height. Also advised pt of med change to increase she is to take 2 40mg  tablets (80mg  total) of Furosemide  Friday, Sat, and Sunday. Then back to 40mg . Furosemide  daily.  Ms. Mcnear advises she understands same. Discussed with her once again to stay away from Gatorade and other sports drinks due to sugar and sodium content.     Mary Sharps, EMT-Paramedic [phone] 06/27/2024

## 2024-06-27 NOTE — Telephone Encounter (Signed)
Pt aware via Freada Bergeron with para medicine

## 2024-06-27 NOTE — Telephone Encounter (Signed)
 Called pt to f/u on weight.  06/19/24 = 161lb 06/26/24 = 169.8lb 06/27/24 = 167.4lb  She denies any increased SOB.  She states she has been drinking lots of Gatorade which I advised her to discontinue and to also make sure she is monitoring her daily fluid intake and to remember < 2L.    Please advise on possible med changes.     Mary Sharps, EMT-Paramedic [phone] 06/27/2024

## 2024-07-02 ENCOUNTER — Ambulatory Visit (INDEPENDENT_AMBULATORY_CARE_PROVIDER_SITE_OTHER): Admitting: Podiatry

## 2024-07-02 ENCOUNTER — Encounter: Payer: Self-pay | Admitting: Podiatry

## 2024-07-02 DIAGNOSIS — E119 Type 2 diabetes mellitus without complications: Secondary | ICD-10-CM | POA: Diagnosis not present

## 2024-07-02 DIAGNOSIS — B351 Tinea unguium: Secondary | ICD-10-CM | POA: Diagnosis not present

## 2024-07-02 DIAGNOSIS — M79674 Pain in right toe(s): Secondary | ICD-10-CM | POA: Diagnosis not present

## 2024-07-02 DIAGNOSIS — M79675 Pain in left toe(s): Secondary | ICD-10-CM | POA: Diagnosis not present

## 2024-07-03 ENCOUNTER — Other Ambulatory Visit (HOSPITAL_COMMUNITY): Payer: Self-pay | Admitting: Primary Care

## 2024-07-03 NOTE — Progress Notes (Unsigned)
 Paramedicine Encounter    Patient ID: Stacey Lin, female    DOB: 05-14-1966, 58 y.o.   MRN: [id number]   Complaints***  Assessment***  Compliance with meds***  Pill box filled x 2 weeks  Refills needed Farxiga , Metformin , Furosemide , Atorvastatin   Meds changes since last visit  took 1 extra 40mg .Furosemide  daily x's 3 days then back to 40mg  daily.   Social changes NONE  LMP  (LMP Unknown)  Weight yesterday-167.8lb Last visit weight-169lb  ACTION: Home visit completed  [name] [phone] 07/03/24  Patient Care Team: Celestia [name] SQUIBB, NP as PCP - General (Internal Medicine)  Patient Active Problem List   Diagnosis Date Noted   Low blood pressure, not hypotension (required pausing some of her GDMT) 04/18/2024   Pruritic intertrigo 04/18/2024   Hot flashes due to menopause 04/18/2024   Constipation 04/18/2024   Acute respiratory distress 04/05/2024   Lumbar radiculopathy 03/13/2024   CHF (congestive heart failure) (HCC) 02/29/2024   COPD with acute exacerbation (HCC) 02/28/2024   Respiratory failure with hypoxia (HCC) 11/21/2023   COPD exacerbation (HCC) 07/25/2023   Nonrheumatic aortic valve insufficiency    Elevated LFTs 04/08/2022   Acute metabolic encephalopathy 04/08/2022   Depressed mood 04/08/2022   Headache 04/07/2022   Obesity (BMI 30-39.9) 04/07/2022   Acute exacerbation of CHF (congestive heart failure) (HCC) 04/06/2022   Polysubstance abuse (HCC) 04/06/2022   Elevated troponin 04/06/2022   Controlled type 2 diabetes mellitus with hyperglycemia (HCC) 04/06/2022   Transaminitis 04/06/2022   Hypoalbuminemia 04/06/2022   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/11/2021   Upper airway cough syndrome 12/20/2018   Morbid obesity due to excess calories (HCC) 12/04/2018   Cigarette smoker 11/21/2018   Asthmatic bronchitis, moderate persistent, uncomplicated 11/20/2018   DOE (dyspnea on exertion) 04/14/2016   Streptococcus  pneumoniae pneumonia (HCC) 04/07/2016   Acute respiratory failure with hypoxia (HCC) 04/03/2016   Elevated lactic acid level 04/03/2016   Leukocytosis 04/03/2016   New onset type 2 diabetes mellitus (HCC) 04/03/2016   Abnormal urinalysis 04/03/2016   Trichomonas infection 04/03/2016   Non compliance w medication regimen 01/12/2016   COPD without exacerbation (HCC) 12/04/2014   AKI (acute kidney injury) (HCC) 03/14/2014   Acute on chronic systolic heart failure (HCC) 03/14/2014   Musculoskeletal chest pain 03/14/2014   Dyspnea 12/03/2013   Periodic health assessment, general screening, adult 10/03/2013   Mitral valve regurgitation 08/22/2013   FIBROIDS, UTERUS 05/11/2010   ANEMIA, SECONDARY TO BLOOD LOSS 05/11/2010   Asthmatic bronchitis 05/11/2010   TOBACCO USER 07/07/2009   Essential hypertension, benign 06/19/2009   MENORRHAGIA 06/19/2009   COCAINE  ABUSE, HX OF 06/19/2009    Current Outpatient Medications:    albuterol  (VENTOLIN  HFA) 108 (90 Base) MCG/ACT inhaler, Inhale 2 puffs into the lungs every 4 (four) hours as needed for wheezing or shortness of breath., Disp: 17 g, Rfl: 1   ammonium lactate  (LAC-HYDRIN ) 12 % lotion, Apply 1 Application topically as needed for dry skin., Disp: 400 g, Rfl: 5   atorvastatin  (LIPITOR) 40 MG tablet, Take 1 tablet (40 mg total) by mouth daily., Disp: 90 tablet, Rfl: 1   carvedilol  (COREG ) 3.125 MG tablet, Take 1 tablet (3.125 mg total) by mouth 2 (two) times daily with a meal., Disp: 60 tablet, Rfl: 3   dapagliflozin  propanediol (FARXIGA ) 10 MG TABS tablet, Take 1 tablet (10 mg total) by mouth daily., Disp: 90 tablet, Rfl: 1   famotidine  (PEPCID ) 20 MG tablet, One after supper (Patient not taking: Reported on  06/26/2024), Disp: 30 tablet, Rfl: 11   fluticasone  furoate-vilanterol (BREO ELLIPTA ) 100-25 MCG/ACT AEPB, Inhale 1 puff into the lungs daily., Disp: 1 each, Rfl: 11   furosemide  (LASIX ) 40 MG tablet, Take 1 tablet (40 mg total) by mouth  daily., Disp: 30 tablet, Rfl: 3   gabapentin  (NEURONTIN ) 300 MG capsule, TAKE ONE (1) CAPSULE BY MOUTH TWICE DAILY (Patient not taking: Reported on 06/26/2024), Disp: 60 capsule, Rfl: 10   ipratropium-albuterol  (DUONEB) 0.5-2.5 (3) MG/3ML SOLN, Take 3 mLs by nebulization 2 (two) times daily., Disp: 180 mL, Rfl: 1   metFORMIN  (GLUCOPHAGE -XR) 500 MG 24 hr tablet, Take 1 tablet (500 mg total) by mouth daily., Disp: 90 tablet, Rfl: 1   naloxone  (NARCAN ) nasal spray 4 mg/0.1 mL, Inject intranasal for overdose (Patient taking differently: Inject intranasal for overdose), Disp: 1 each, Rfl: 1   pantoprazole  (PROTONIX ) 40 MG tablet, Take 40 mg by mouth daily., Disp: , Rfl:    QUEtiapine  (SEROQUEL ) 50 MG tablet, Take 1 tablet (50 mg total) by mouth at bedtime., Disp: 90 tablet, Rfl: 1   spironolactone  (ALDACTONE ) 25 MG tablet, Take 0.5 tablets (12.5 mg total) by mouth daily., Disp: 45 tablet, Rfl: 3   valsartan  (DIOVAN ) 40 MG tablet, Take 1 tablet (40 mg total) by mouth daily., Disp: 90 tablet, Rfl: 1 No Known Allergies   Social History   Socioeconomic History   Marital status: Divorced    Spouse name: Not on file   Number of children: Not on file   Years of education: Not on file   Highest education level: Some college, no degree  Occupational History   Not on file  Tobacco Use   Smoking status: Former    Current packs/day: 0.00    Average packs/day: 1 pack/day for 35.0 years (35.0 ttl pk-yrs)    Types: Cigarettes    Start date: 08/25/1984    Quit date: 08/26/2019    Years since quitting: 4.8   Smokeless tobacco: Never  Vaping Use   Vaping status: Former  Substance and Sexual Activity   Alcohol use: No    Alcohol/week: 0.0 standard drinks of alcohol   Drug use: Not Currently    Frequency: 4.0 times per week    Types: Crack cocaine     Comment: RELAPSE 12/2016   Sexual activity: Not on file  Other Topics Concern   Not on file  Social History Narrative   Lives with son in an apartment  on the third floor.  Does not work.  On disability.  Previously worked in [employer].    Education: high school.     Social Drivers of Corporate investment banker Strain: Low Risk  (05/06/2024)   Overall Financial Resource Strain (CARDIA)    Difficulty of Paying Living Expenses: Not very hard  Food Insecurity: No Food Insecurity (05/06/2024)   Hunger Vital Sign    Worried About Running Out of Food in the Last Year: Never true    Ran Out of Food in the Last Year: Never true  Recent Concern: Food Insecurity - Food Insecurity Present (02/28/2024)   Hunger Vital Sign    Worried About Running Out of Food in the Last Year: Sometimes true    Ran Out of Food in the Last Year: Sometimes true  Transportation Needs: Unmet Transportation Needs (06/10/2024)   PRAPARE - Administrator, Civil Service (Medical): Yes    Lack of Transportation (Non-Medical): Yes  Physical Activity: Unknown (10/18/2023)   Exercise Vital Sign  Days of Exercise per Week: 2 days    Minutes of Exercise per Session: Patient declined  Recent Concern: Physical Activity - Inactive (08/02/2023)   Exercise Vital Sign    Days of Exercise per Week: 1 day    Minutes of Exercise per Session: 0 min  Stress: Stress Concern Present (05/06/2024)   [employer] of Occupational Health - Occupational Stress Questionnaire    Feeling of Stress: To some extent  Social Connections: Moderately Integrated (04/05/2024)   Social Connection and Isolation Panel    Frequency of Communication with Friends and Family: More than three times a week    Frequency of Social Gatherings with Friends and Family: More than three times a week    Attends Religious Services: More than 4 times per year    Active Member of Clubs or Organizations: Yes    Attends Banker Meetings: More than 4 times per year    Marital Status: Never married  Intimate Partner Violence: Not At Risk (04/05/2024)   Humiliation, Afraid, Rape, and Kick  questionnaire    Fear of Current or Ex-Partner: No    Emotionally Abused: No    Physically Abused: No    Sexually Abused: No    Physical Exam      Future Appointments  Date Time Provider Department Center  07/18/2024  1:30 PM Celestia [name] SQUIBB, NP [facility]  07/24/2024  1:15 PM Darlean Ozell NOVAK, MD [facility] [address]  08/27/2024  1:40 PM [facility]  10/08/2024  2:15 PM Gaynel Delon CROME, DPM [facility]

## 2024-07-05 DIAGNOSIS — J449 Chronic obstructive pulmonary disease, unspecified: Secondary | ICD-10-CM | POA: Diagnosis not present

## 2024-07-05 DIAGNOSIS — Z79899 Other long term (current) drug therapy: Secondary | ICD-10-CM | POA: Diagnosis not present

## 2024-07-05 DIAGNOSIS — M4696 Unspecified inflammatory spondylopathy, lumbar region: Secondary | ICD-10-CM | POA: Diagnosis not present

## 2024-07-05 DIAGNOSIS — M1A09X0 Idiopathic chronic gout, multiple sites, without tophus (tophi): Secondary | ICD-10-CM | POA: Diagnosis not present

## 2024-07-05 DIAGNOSIS — I10 Essential (primary) hypertension: Secondary | ICD-10-CM | POA: Diagnosis not present

## 2024-07-05 DIAGNOSIS — E119 Type 2 diabetes mellitus without complications: Secondary | ICD-10-CM | POA: Diagnosis not present

## 2024-07-07 NOTE — Progress Notes (Signed)
  Subjective:  Patient ID: Stacey Lin, female    DOB: 06-20-1966,  MRN: [id number]  Stacey Lin presents to clinic today for preventative diabetic foot care for painful thick toenails that are difficult to trim. Pain interferes with ambulation. Aggravating factors include wearing enclosed shoe gear. Pain is relieved with periodic professional debridement.   Patient states she is celebrating her sobriety. She enjoys going to church service. Chief Complaint  Patient presents with   Nail Problem    Thick painful toenails, 3 month follow up    New problem(s): None.   PCP is Celestia [name] SQUIBB, NP. ARNETTA 06/06/2024.  No Known Allergies  Review of Systems: Negative except as noted in the HPI.  Objective:  There were no vitals filed for this visit. Stacey Lin is a pleasant 58 y.o. female in NAD. AAO x 3.  Vascular Examination: Capillary refill time immediate b/l. Faintly palpable pedal pulses. Pedal hair diminished b/l. No pain with calf compression b/l. Skin temperature gradient WNL b/l. No cyanosis or clubbing b/l. No ischemia or gangrene noted b/l. No edema noted b/l LE.  Neurological Examination: Sensation grossly intact b/l with 10 gram monofilament.   Dermatological Examination: Pedal skin with normal turgor, texture and tone b/l.  No open wounds. No interdigital macerations.   Toenails 1-5 b/l thick, discolored, elongated with subungual debris and pain on dorsal palpation.   Musculoskeletal Examination: Muscle strength 5/5 to all lower extremity muscle groups bilaterally. No pain, crepitus or joint limitation noted with ROM bilateral LE. Pes planus deformity noted bilateral LE. Uses cane for ambulation assistance.  Radiographs: None  Assessment/Plan: 1. Pain due to onychomycosis of toenails of both feet   2. Controlled type 2 diabetes mellitus without complication, without long-term current use of insulin  [facility])     Patient was evaluated and treated. All  patient's and/or POA's questions/concerns addressed on today's visit. Mycotic toenails 1-5 debrided in length and girth without incident.  Continue daily foot inspections and monitor blood glucose per PCP/Endocrinologist's recommendations.Continue soft, supportive shoe gear daily. Report any pedal injuries to medical professional. Call office if there are any quesitons/concerns. -Patient/POA to call should there be question/concern in the interim.   Return in about 3 months (around 10/01/2024).  [name], DPM      [facility] LOCATION: [address] [place] [address]                   Office [phone]   [facility] LOCATION: [address] [place], [place] [address] Office [phone]

## 2024-07-10 ENCOUNTER — Other Ambulatory Visit (HOSPITAL_BASED_OUTPATIENT_CLINIC_OR_DEPARTMENT_OTHER): Admitting: Pharmacist

## 2024-07-10 ENCOUNTER — Telehealth (HOSPITAL_COMMUNITY): Payer: Self-pay | Admitting: Cardiology

## 2024-07-10 ENCOUNTER — Other Ambulatory Visit (HOSPITAL_COMMUNITY): Payer: Self-pay | Admitting: Emergency Medicine

## 2024-07-10 ENCOUNTER — Telehealth: Payer: Self-pay | Admitting: Pharmacist

## 2024-07-10 DIAGNOSIS — Z79899 Other long term (current) drug therapy: Secondary | ICD-10-CM

## 2024-07-10 NOTE — Progress Notes (Signed)
 Pharmacy Quality Measure Review  This patient is appearing on a report for being at risk of failing the adherence measure for cholesterol (statin) and diabetes medications this calendar year.   Medication: atorvastatin  Last fill date: 07/04/2024 for 30 day supply  Medication: dapagliflozin   Last fill date: 06/19/2024 for 30 day supply  Medication: metformin  Last fill date: 07/04/2024 for 30 day supply  Medication: valsartan  Last fill date: 05/16/2024 for 90 day supply.   Refills are up to date. Reminder set for next fills.   Herlene Fleeta Morris, PharmD, JAQUELINE, CPP Clinical Pharmacist [facility] [phone]

## 2024-07-10 NOTE — Telephone Encounter (Signed)
 [facility] aware and voiced understanding

## 2024-07-10 NOTE — Progress Notes (Signed)
 Paramedicine Encounter    Patient ID: Stacey Lin, female    DOB: 1966-01-21, 58 y.o.   MRN: [id number]   Complaints NONE  Assessment A&O x 4, skin W&D w/ good color.  Denies chest pain or SOB.  Lung sounds clear and equal bilat.  Minimal swelling to lower extremities.  Exertional SOB.  Weight up 5lbs in 1 week.    Compliance with meds YES  Pill box filled x 2 weeks  Refills needed Gabapentin , Carvedilol , Farxiga   Meds changes since last visit Allopurinol  added from pain management doctor.      Social changes NONE   BP 118/80 (BP Location: Left Arm, Patient Position: Sitting, Cuff Size: Normal)   Pulse 80   Resp 18   Wt 174 lb 12.8 oz (79.3 kg)   LMP  (LMP Unknown)   BMI 33.03 kg/m  Weight yesterday- not taken Last visit weight-169.7lb  Due to weight gain, orders received to take an additional 40mg . Lasix  for 2 days then return to 40mg  Lasix  daily. Med box reconciled x 1 week to reflect these changes and pt verbalizes that she understands same.  ACTION: Home visit completed  [name] [phone] 07/10/24  Patient Care Team: Celestia [name] SQUIBB, NP as PCP - General (Internal Medicine)  Patient Active Problem List   Diagnosis Date Noted   Low blood pressure, not hypotension (required pausing some of her GDMT) 04/18/2024   Pruritic intertrigo 04/18/2024   Hot flashes due to menopause 04/18/2024   Constipation 04/18/2024   Acute respiratory distress 04/05/2024   Lumbar radiculopathy 03/13/2024   CHF (congestive heart failure) (HCC) 02/29/2024   COPD with acute exacerbation (HCC) 02/28/2024   Respiratory failure with hypoxia (HCC) 11/21/2023   COPD exacerbation (HCC) 07/25/2023   Nonrheumatic aortic valve insufficiency    Elevated LFTs 04/08/2022   Acute metabolic encephalopathy 04/08/2022   Depressed mood 04/08/2022   Headache 04/07/2022   Obesity (BMI 30-39.9) 04/07/2022   Acute exacerbation of CHF (congestive heart failure) (HCC)  04/06/2022   Polysubstance abuse (HCC) 04/06/2022   Elevated troponin 04/06/2022   Controlled type 2 diabetes mellitus with hyperglycemia (HCC) 04/06/2022   Transaminitis 04/06/2022   Hypoalbuminemia 04/06/2022   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/11/2021   Upper airway cough syndrome 12/20/2018   Morbid obesity due to excess calories (HCC) 12/04/2018   Cigarette smoker 11/21/2018   Asthmatic bronchitis, moderate persistent, uncomplicated 11/20/2018   DOE (dyspnea on exertion) 04/14/2016   Streptococcus pneumoniae pneumonia (HCC) 04/07/2016   Acute respiratory failure with hypoxia (HCC) 04/03/2016   Elevated lactic acid level 04/03/2016   Leukocytosis 04/03/2016   New onset type 2 diabetes mellitus (HCC) 04/03/2016   Abnormal urinalysis 04/03/2016   Trichomonas infection 04/03/2016   Non compliance w medication regimen 01/12/2016   COPD without exacerbation (HCC) 12/04/2014   AKI (acute kidney injury) (HCC) 03/14/2014   Acute on chronic systolic heart failure (HCC) 03/14/2014   Musculoskeletal chest pain 03/14/2014   Dyspnea 12/03/2013   Periodic health assessment, general screening, adult 10/03/2013   Mitral valve regurgitation 08/22/2013   FIBROIDS, UTERUS 05/11/2010   ANEMIA, SECONDARY TO BLOOD LOSS 05/11/2010   Asthmatic bronchitis 05/11/2010   TOBACCO USER 07/07/2009   Essential hypertension, benign 06/19/2009   MENORRHAGIA 06/19/2009   COCAINE  ABUSE, HX OF 06/19/2009    Current Outpatient Medications:    albuterol  (VENTOLIN  HFA) 108 (90 Base) MCG/ACT inhaler, Inhale 2 puffs into the lungs every 4 (four) hours as needed for wheezing or shortness of breath.,  Disp: 17 g, Rfl: 1   allopurinol  (ZYLOPRIM ) 300 MG tablet, Take 300 mg by mouth 2 (two) times daily., Disp: , Rfl:    ammonium lactate  (LAC-HYDRIN ) 12 % lotion, Apply 1 Application topically as needed for dry skin., Disp: 400 g, Rfl: 5   atorvastatin  (LIPITOR) 40 MG tablet, Take 1 tablet (40 mg total) by  mouth daily., Disp: 90 tablet, Rfl: 1   carvedilol  (COREG ) 3.125 MG tablet, Take 1 tablet (3.125 mg total) by mouth 2 (two) times daily with a meal., Disp: 60 tablet, Rfl: 3   dapagliflozin  propanediol (FARXIGA ) 10 MG TABS tablet, Take 1 tablet (10 mg total) by mouth daily., Disp: 90 tablet, Rfl: 1   fluticasone  furoate-vilanterol (BREO ELLIPTA ) 100-25 MCG/ACT AEPB, Inhale 1 puff into the lungs daily., Disp: 1 each, Rfl: 11   furosemide  (LASIX ) 40 MG tablet, Take 1 tablet (40 mg total) by mouth daily., Disp: 30 tablet, Rfl: 3   ipratropium-albuterol  (DUONEB) 0.5-2.5 (3) MG/3ML SOLN, Take 3 mLs by nebulization 2 (two) times daily., Disp: 180 mL, Rfl: 1   metFORMIN  (GLUCOPHAGE -XR) 500 MG 24 hr tablet, Take 1 tablet (500 mg total) by mouth daily., Disp: 90 tablet, Rfl: 1   naloxone  (NARCAN ) nasal spray 4 mg/0.1 mL, Inject intranasal for overdose (Patient taking differently: Inject intranasal for overdose), Disp: 1 each, Rfl: 1   pantoprazole  (PROTONIX ) 40 MG tablet, Take 40 mg by mouth daily., Disp: , Rfl:    QUEtiapine  (SEROQUEL ) 50 MG tablet, Take 1 tablet (50 mg total) by mouth at bedtime., Disp: 90 tablet, Rfl: 1   spironolactone  (ALDACTONE ) 25 MG tablet, Take 0.5 tablets (12.5 mg total) by mouth daily., Disp: 45 tablet, Rfl: 3   valsartan  (DIOVAN ) 40 MG tablet, Take 1 tablet (40 mg total) by mouth daily., Disp: 90 tablet, Rfl: 1   famotidine  (PEPCID ) 20 MG tablet, One after supper (Patient not taking: Reported on 07/10/2024), Disp: 30 tablet, Rfl: 11   gabapentin  (NEURONTIN ) 300 MG capsule, TAKE ONE (1) CAPSULE BY MOUTH TWICE DAILY (Patient not taking: Reported on 07/10/2024), Disp: 60 capsule, Rfl: 10 No Known Allergies   Social History   Socioeconomic History   Marital status: Divorced    Spouse name: Not on file   Number of children: Not on file   Years of education: Not on file   Highest education level: Some college, no degree  Occupational History   Not on file  Tobacco Use    Smoking status: Former    Current packs/day: 0.00    Average packs/day: 1 pack/day for 35.0 years (35.0 ttl pk-yrs)    Types: Cigarettes    Start date: 08/25/1984    Quit date: 08/26/2019    Years since quitting: 4.8   Smokeless tobacco: Never  Vaping Use   Vaping status: Former  Substance and Sexual Activity   Alcohol use: No    Alcohol/week: 0.0 standard drinks of alcohol   Drug use: Not Currently    Frequency: 4.0 times per week    Types: Crack cocaine     Comment: RELAPSE 12/2016   Sexual activity: Not on file  Other Topics Concern   Not on file  Social History Narrative   Lives with son in an apartment on the third floor.  Does not work.  On disability.  Previously worked in [employer].    Education: high school.     Social Drivers of Corporate investment banker Strain: Low Risk  (05/06/2024)   Overall Physicist, medical Strain (  CARDIA)    Difficulty of Paying Living Expenses: Not very hard  Food Insecurity: No Food Insecurity (05/06/2024)   Hunger Vital Sign    Worried About Running Out of Food in the Last Year: Never true    Ran Out of Food in the Last Year: Never true  Recent Concern: Food Insecurity - Food Insecurity Present (02/28/2024)   Hunger Vital Sign    Worried About Running Out of Food in the Last Year: Sometimes true    Ran Out of Food in the Last Year: Sometimes true  Transportation Needs: Unmet Transportation Needs (06/10/2024)   PRAPARE - Administrator, Civil Service (Medical): Yes    Lack of Transportation (Non-Medical): Yes  Physical Activity: Unknown (10/18/2023)   Exercise Vital Sign    Days of Exercise per Week: 2 days    Minutes of Exercise per Session: Patient declined  Recent Concern: Physical Activity - Inactive (08/02/2023)   Exercise Vital Sign    Days of Exercise per Week: 1 day    Minutes of Exercise per Session: 0 min  Stress: Stress Concern Present (05/06/2024)   [employer] of Occupational Health - Occupational Stress  Questionnaire    Feeling of Stress: To some extent  Social Connections: Moderately Integrated (04/05/2024)   Social Connection and Isolation Panel    Frequency of Communication with Friends and Family: More than three times a week    Frequency of Social Gatherings with Friends and Family: More than three times a week    Attends Religious Services: More than 4 times per year    Active Member of Clubs or Organizations: Yes    Attends Banker Meetings: More than 4 times per year    Marital Status: Never married  Intimate Partner Violence: Not At Risk (04/05/2024)   Humiliation, Afraid, Rape, and Kick questionnaire    Fear of Current or Ex-Partner: No    Emotionally Abused: No    Physically Abused: No    Sexually Abused: No    Physical Exam      Future Appointments  Date Time Provider Department Center  07/18/2024  1:30 PM Celestia [name] SQUIBB, NP [facility]  07/24/2024  1:15 PM Darlean Ozell NOVAK, MD [facility] [address]  08/27/2024  1:40 PM [facility] [place] [facility]  09/05/2024 11:00 AM [facility] ECHO OP 1 [facility]  09/05/2024 12:00 PM Bensimhon, Toribio SAUNDERS, MD [facility] None  10/08/2024  2:15 PM Gaynel Delon CROME, DPM [facility]

## 2024-07-10 NOTE — Telephone Encounter (Signed)
 [facility] during patients home visit to report 5 lb weight gain in one week  Last week weight 169, weight today 174 No chest pain Stable exertional SOB Minimal swelling B/P 118/80 Medication compliant  Pt is eating salty snack during home visit so sodium compliance is questionable.     Please advise

## 2024-07-10 NOTE — Telephone Encounter (Signed)
 Patient scheduled with [facility] on 9/25. Can we reschedule her with me for my next available so I can get her in and get an updated A1c?

## 2024-07-11 ENCOUNTER — Telehealth: Payer: Self-pay | Admitting: Primary Care

## 2024-07-11 NOTE — Telephone Encounter (Signed)
 Confirmed appt for 9/19

## 2024-07-12 ENCOUNTER — Ambulatory Visit: Attending: Internal Medicine | Admitting: Internal Medicine

## 2024-07-12 VITALS — BP 115/77 | HR 83 | Ht 61.0 in | Wt 174.0 lb

## 2024-07-12 DIAGNOSIS — E785 Hyperlipidemia, unspecified: Secondary | ICD-10-CM

## 2024-07-12 DIAGNOSIS — Z87891 Personal history of nicotine dependence: Secondary | ICD-10-CM

## 2024-07-12 DIAGNOSIS — Z23 Encounter for immunization: Secondary | ICD-10-CM

## 2024-07-12 DIAGNOSIS — Z7984 Long term (current) use of oral hypoglycemic drugs: Secondary | ICD-10-CM

## 2024-07-12 DIAGNOSIS — I152 Hypertension secondary to endocrine disorders: Secondary | ICD-10-CM

## 2024-07-12 DIAGNOSIS — Z79899 Other long term (current) drug therapy: Secondary | ICD-10-CM

## 2024-07-12 DIAGNOSIS — J028 Acute pharyngitis due to other specified organisms: Secondary | ICD-10-CM | POA: Diagnosis not present

## 2024-07-12 DIAGNOSIS — N182 Chronic kidney disease, stage 2 (mild): Secondary | ICD-10-CM | POA: Diagnosis not present

## 2024-07-12 DIAGNOSIS — B9789 Other viral agents as the cause of diseases classified elsewhere: Secondary | ICD-10-CM | POA: Diagnosis not present

## 2024-07-12 DIAGNOSIS — E1169 Type 2 diabetes mellitus with other specified complication: Secondary | ICD-10-CM

## 2024-07-12 DIAGNOSIS — E1159 Type 2 diabetes mellitus with other circulatory complications: Secondary | ICD-10-CM | POA: Diagnosis not present

## 2024-07-12 DIAGNOSIS — E119 Type 2 diabetes mellitus without complications: Secondary | ICD-10-CM

## 2024-07-12 LAB — POCT GLYCOSYLATED HEMOGLOBIN (HGB A1C): HbA1c, POC (controlled diabetic range): 6.3 % (ref 0.0–7.0)

## 2024-07-12 LAB — GLUCOSE, POCT (MANUAL RESULT ENTRY): POC Glucose: 122 mg/dL — AB (ref 70–99)

## 2024-07-12 MED ORDER — ACCU-CHEK GUIDE W/DEVICE KIT: PACK | 0 refills | Status: AC

## 2024-07-12 MED ORDER — ACCU-CHEK GUIDE TEST VI STRP: ORAL_STRIP | 12 refills | Status: AC

## 2024-07-12 MED ORDER — ACCU-CHEK SOFTCLIX LANCETS MISC: 12 refills | Status: AC

## 2024-07-12 NOTE — Patient Instructions (Signed)
 VISIT SUMMARY:  Today, you came in for a follow-up visit to manage your diabetes, hypertension, high cholesterol, and COPD. We also addressed your sore throat and discussed your current medications and lifestyle habits.  YOUR PLAN:  -TYPE 2 DIABETES MELLITUS WITH HYPERGLYCEMIA AND POLYPHAGIA: Your diabetes is well-controlled with an A1c of 6.3%. Overeating may be linked to past drug addiction and your current living situation. We will send a prescription for an AccuCheck blood glucose monitoring kit and test strips to the pharmacy. Please check your blood sugar daily before breakfast. You will also be referred to a nutritionist for dietary counseling. Try to avoid eating full meals past 8 PM. We will discuss the potential use of Ozempic or Mounjaro at your next visit, pending insurance approval.  -ACUTE PHARYNGITIS: You have a sore throat that has lasted for a week, but there is no fever or infection. For relief, you can use throat lozenges and gargle with warm salt water. If your symptoms persist, please return for further evaluation.  -HYPERLIPIDEMIA: Your cholesterol levels have not been checked since October of last year. We will order lab work to check your cholesterol levels.  -CHRONIC KIDNEY DISEASE, UNSPECIFIED STAGE: You have chronic kidney disease, which means your kidneys are not functioning as well as they should. To prevent further damage, avoid NSAIDs like ibuprofen , Aleve, and Advil . For pain management, you can use Tylenol .  INSTRUCTIONS:  Please follow up with your primary provider to discuss the potential use of Ozempic or Mounjaro for your diabetes management. Additionally, make sure to get your lab work done to check your cholesterol levels. If your sore throat persists, return for further evaluation.

## 2024-07-12 NOTE — Progress Notes (Signed)
 Patient ID: Stacey Lin, female    DOB: 1966/08/05  MRN: [id number]  CC: Diabetes (DM f/u. /No questions / concerns/Flu & pneumonia vax administered on 07/12/2024 - C.A.)   Subjective: Stacey Lin is a 58 y.o. female who presents for chronic ds management. PCP is [name] Bohr, NP who is currently on leave Her concerns today include:  Pt with hx of HTN, DM, HL, COPD, CHF (EF 35% in 2/20214; 05/2024: 55-60%, mild/mod AI, grade I DD and mild MR   Discussed the use of AI scribe software for clinical note transcription with the patient, who gave verbal consent to proceed.  History of Present Illness Stacey Lin is a 58 year old female with diabetes, hypertension, hyperlipidemia, and COPD who presents for follow-up care.  DM: Results for orders placed or performed in visit on 07/12/24  POCT glucose (manual entry)   Collection Time: 07/12/24  9:38 AM  Result Value Ref Range   POC Glucose 122 (A) 70 - 99 mg/dl  POCT glycosylated hemoglobin (Hb A1C)   Collection Time: 07/12/24  9:39 AM  Result Value Ref Range   Hemoglobin A1C     HbA1c POC (<> result, manual entry)     HbA1c, POC (prediabetic range)     HbA1c, POC (controlled diabetic range) 6.3 0.0 - 7.0 %  Lipid panel   Collection Time: 07/12/24 10:36 AM  Result Value Ref Range   Cholesterol, Total 146 100 - 199 mg/dL   Triglycerides 62 0 - 149 mg/dL   HDL 77 >60 mg/dL   VLDL Cholesterol Cal 13 5 - 40 mg/dL   LDL Chol Calc (NIH) 56 0 - 99 mg/dL   Chol/HDL Ratio 1.9 0.0 - 4.4 ratio  Her diabetes is managed with metformin  500 mg daily and Farxiga  10 mg daily. She does not have equipment to check her blood sugar at home but wants to obtain a device. She feels she is overeating, possibly due to past drug addiction, and often snacks on unhealthy foods available at her son's house where she resides. Meals are often prepared late at night, and she participates in these meals, contributing to her overeating.  Her  hypertension/CHF are managed with Farxiga  10 mg, carvedilol  3.125 twice daily, spironolactone  25 mg daily, Furosemide  40 mg daily and valsartan  40 mg daily.  She confirms taking all her medications as organized by the home paramedic program. And note that her kidney function namely the GFR has ranged between 43-60 recently.  She was not aware of any issues with the kidneys.  She is on atorvastatin  40 mg daily for cholesterol management, but it has been a while since her cholesterol levels were checked.   She experiences a sore throat that has persisted for about a week, with no associated fever or drainage at the back of the throat, but she does report a runny nose.    Patient Active Problem List   Diagnosis Date Noted   Low blood pressure, not hypotension (required pausing some of her GDMT) 04/18/2024   Pruritic intertrigo 04/18/2024   Hot flashes due to menopause 04/18/2024   Constipation 04/18/2024   Acute respiratory distress 04/05/2024   Lumbar radiculopathy 03/13/2024   CHF (congestive heart failure) (HCC) 02/29/2024   COPD with acute exacerbation (HCC) 02/28/2024   Respiratory failure with hypoxia (HCC) 11/21/2023   COPD exacerbation (HCC) 07/25/2023   Nonrheumatic aortic valve insufficiency    Elevated LFTs 04/08/2022   Acute metabolic encephalopathy 04/08/2022   Depressed mood 04/08/2022  Headache 04/07/2022   Obesity (BMI 30-39.9) 04/07/2022   Acute exacerbation of CHF (congestive heart failure) (HCC) 04/06/2022   Polysubstance abuse (HCC) 04/06/2022   Elevated troponin 04/06/2022   Controlled type 2 diabetes mellitus with hyperglycemia (HCC) 04/06/2022   Transaminitis 04/06/2022   Hypoalbuminemia 04/06/2022   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/11/2021   Upper airway cough syndrome 12/20/2018   Cigarette smoker 11/21/2018   Asthmatic bronchitis, moderate persistent, uncomplicated 11/20/2018   DOE (dyspnea on exertion) 04/14/2016   Streptococcus pneumoniae  pneumonia (HCC) 04/07/2016   Acute respiratory failure with hypoxia (HCC) 04/03/2016   Elevated lactic acid level 04/03/2016   Leukocytosis 04/03/2016   New onset type 2 diabetes mellitus (HCC) 04/03/2016   Abnormal urinalysis 04/03/2016   Trichomonas infection 04/03/2016   Non compliance w medication regimen 01/12/2016   COPD without exacerbation (HCC) 12/04/2014   AKI (acute kidney injury) (HCC) 03/14/2014   Acute on chronic systolic heart failure (HCC) 03/14/2014   Musculoskeletal chest pain 03/14/2014   Dyspnea 12/03/2013   Periodic health assessment, general screening, adult 10/03/2013   Mitral valve regurgitation 08/22/2013   FIBROIDS, UTERUS 05/11/2010   ANEMIA, SECONDARY TO BLOOD LOSS 05/11/2010   Asthmatic bronchitis 05/11/2010   TOBACCO USER 07/07/2009   Essential hypertension, benign 06/19/2009   MENORRHAGIA 06/19/2009   COCAINE  ABUSE, HX OF 06/19/2009     Current Outpatient Medications on File Prior to Visit  Medication Sig Dispense Refill   albuterol  (VENTOLIN  HFA) 108 (90 Base) MCG/ACT inhaler Inhale 2 puffs into the lungs every 4 (four) hours as needed for wheezing or shortness of breath. 17 g 1   allopurinol  (ZYLOPRIM ) 300 MG tablet Take 300 mg by mouth 2 (two) times daily.     ammonium lactate  (LAC-HYDRIN ) 12 % lotion Apply 1 Application topically as needed for dry skin. 400 g 5   atorvastatin  (LIPITOR) 40 MG tablet Take 1 tablet (40 mg total) by mouth daily. 90 tablet 1   carvedilol  (COREG ) 3.125 MG tablet Take 1 tablet (3.125 mg total) by mouth 2 (two) times daily with a meal. 60 tablet 3   dapagliflozin  propanediol (FARXIGA ) 10 MG TABS tablet Take 1 tablet (10 mg total) by mouth daily. 90 tablet 1   famotidine  (PEPCID ) 20 MG tablet One after supper 30 tablet 11   fluticasone  furoate-vilanterol (BREO ELLIPTA ) 100-25 MCG/ACT AEPB Inhale 1 puff into the lungs daily. 1 each 11   furosemide  (LASIX ) 40 MG tablet Take 1 tablet (40 mg total) by mouth daily. 30 tablet 3    gabapentin  (NEURONTIN ) 300 MG capsule TAKE ONE (1) CAPSULE BY MOUTH TWICE DAILY 60 capsule 10   ipratropium-albuterol  (DUONEB) 0.5-2.5 (3) MG/3ML SOLN Take 3 mLs by nebulization 2 (two) times daily. 180 mL 1   metFORMIN  (GLUCOPHAGE -XR) 500 MG 24 hr tablet Take 1 tablet (500 mg total) by mouth daily. 90 tablet 1   naloxone  (NARCAN ) nasal spray 4 mg/0.1 mL Inject intranasal for overdose (Patient taking differently: Inject intranasal for overdose) 1 each 1   pantoprazole  (PROTONIX ) 40 MG tablet Take 40 mg by mouth daily.     QUEtiapine  (SEROQUEL ) 50 MG tablet Take 1 tablet (50 mg total) by mouth at bedtime. 90 tablet 1   spironolactone  (ALDACTONE ) 25 MG tablet Take 0.5 tablets (12.5 mg total) by mouth daily. 45 tablet 3   valsartan  (DIOVAN ) 40 MG tablet Take 1 tablet (40 mg total) by mouth daily. 90 tablet 1   No current facility-administered medications on file prior to visit.  No Known Allergies  Social History   Socioeconomic History   Marital status: Divorced    Spouse name: Not on file   Number of children: Not on file   Years of education: Not on file   Highest education level: Some college, no degree  Occupational History   Not on file  Tobacco Use   Smoking status: Former    Current packs/day: 0.00    Average packs/day: 1 pack/day for 35.0 years (35.0 ttl pk-yrs)    Types: Cigarettes    Start date: 08/25/1984    Quit date: 08/26/2019    Years since quitting: 4.8   Smokeless tobacco: Never  Vaping Use   Vaping status: Former  Substance and Sexual Activity   Alcohol use: No    Alcohol/week: 0.0 standard drinks of alcohol   Drug use: Not Currently    Frequency: 4.0 times per week    Types: Crack cocaine     Comment: RELAPSE 12/2016   Sexual activity: Not on file  Other Topics Concern   Not on file  Social History Narrative   Lives with son in an apartment on the third floor.  Does not work.  On disability.  Previously worked in [employer].    Education: high school.      Social Drivers of Corporate investment banker Strain: Low Risk  (05/06/2024)   Overall Financial Resource Strain (CARDIA)    Difficulty of Paying Living Expenses: Not very hard  Food Insecurity: No Food Insecurity (07/12/2024)   Hunger Vital Sign    Worried About Running Out of Food in the Last Year: Never true    Ran Out of Food in the Last Year: Never true  Transportation Needs: Unmet Transportation Needs (06/10/2024)   PRAPARE - Administrator, Civil Service (Medical): Yes    Lack of Transportation (Non-Medical): Yes  Physical Activity: Unknown (10/18/2023)   Exercise Vital Sign    Days of Exercise per Week: 2 days    Minutes of Exercise per Session: Patient declined  Recent Concern: Physical Activity - Inactive (08/02/2023)   Exercise Vital Sign    Days of Exercise per Week: 1 day    Minutes of Exercise per Session: 0 min  Stress: Stress Concern Present (05/06/2024)   [employer] of Occupational Health - Occupational Stress Questionnaire    Feeling of Stress: To some extent  Social Connections: Moderately Integrated (04/05/2024)   Social Connection and Isolation Panel    Frequency of Communication with Friends and Family: More than three times a week    Frequency of Social Gatherings with Friends and Family: More than three times a week    Attends Religious Services: More than 4 times per year    Active Member of [employer] or Organizations: Yes    Attends Banker Meetings: More than 4 times per year    Marital Status: Never married  Intimate Partner Violence: Not At Risk (04/05/2024)   Humiliation, Afraid, Rape, and Kick questionnaire    Fear of Current or Ex-Partner: No    Emotionally Abused: No    Physically Abused: No    Sexually Abused: No    Family History  Problem Relation Age of Onset   Hypertension Mother    Allergies Mother    Asthma Mother    Heart disease Mother    Stomach cancer Mother        Deceased, 15   Seizures Mother     Hypertension Father  Deceased   Hypertension Maternal Grandmother    Healthy Brother    Healthy Son    Healthy Daughter     Past Surgical History:  Procedure Laterality Date   LEFT AND RIGHT HEART CATHETERIZATION WITH CORONARY ANGIOGRAM N/A 06/06/2014   Procedure: LEFT AND RIGHT HEART CATHETERIZATION WITH CORONARY ANGIOGRAM;  Surgeon: Toribio JONELLE Fuel, MD;  Location: [facility] CATH LAB;  Service: Cardiovascular;  Laterality: N/A;   MULTIPLE EXTRACTIONS WITH ALVEOLOPLASTY Bilateral 07/24/2015   Procedure: MULTIPLE EXTRACTIONS WITH ALVEOLOPLASTY,  BILATERAL TORI ;  Surgeon: Glendia Primrose, DDS;  Location: [facility] OR;  Service: Oral Surgery;  Laterality: Bilateral;   RIGHT/LEFT HEART CATH AND CORONARY ANGIOGRAPHY N/A 04/11/2022   Procedure: RIGHT/LEFT HEART CATH AND CORONARY ANGIOGRAPHY;  Surgeon: Dann Candyce RAMAN, MD;  Location: [facility] INVASIVE CV LAB;  Service: Cardiovascular;  Laterality: N/A;   TUBAL LIGATION  1986    ROS: Review of Systems Negative except as stated above  PHYSICAL EXAM: BP 115/77 (BP Location: Left Arm, Patient Position: Sitting, Cuff Size: Normal)   Pulse 83   Ht 5' 1 (1.549 m)   Wt 174 lb (78.9 kg)   LMP  (LMP Unknown)   SpO2 99%   BMI 32.88 kg/m   Wt Readings from Last 3 Encounters:  07/12/24 174 lb (78.9 kg)  07/10/24 174 lb 12.8 oz (79.3 kg)  07/03/24 169 lb (76.7 kg)    Physical Exam  General appearance - alert, well appearing, older AAF and in no distress Mental status - normal mood, behavior, speech, dress, motor activity, and thought processes.  Pt constantly texts on her phone while talking to me Mouth - mucous membranes moist, pharynx normal without lesions or exudates Neck - supple, no significant adenopathy Chest - clear to auscultation, no wheezes, rales or rhonchi, symmetric air entry Heart - normal rate, regular rhythm, normal S1, S2, no murmurs, rubs, clicks or gallops Extremities - peripheral pulses normal, no pedal edema, no clubbing or  cyanosis      Latest Ref Rng & Units 06/20/2024   11:41 AM 06/12/2024    1:01 PM 05/15/2024   12:45 PM  CMP  Glucose 70 - 99 mg/dL 860  868  868   BUN 6 - 20 mg/dL 17  24  22    Creatinine 0.44 - 1.00 mg/dL 9.01  8.88  8.77   Sodium 135 - 145 mmol/L 138  140  139   Potassium 3.5 - 5.1 mmol/L 4.3  4.5  3.6   Chloride 98 - 111 mmol/L 101  104  104   CO2 22 - 32 mmol/L 28  27  25    Calcium  8.9 - 10.3 mg/dL 9.3  9.3  9.2    Lipid Panel     Component Value Date/Time   CHOL 146 07/12/2024 1036   TRIG 62 07/12/2024 1036   HDL 77 07/12/2024 1036   CHOLHDL 1.9 07/12/2024 1036   CHOLHDL 2.5 07/26/2023 0254   VLDL 9 07/26/2023 0254   LDLCALC 56 07/12/2024 1036    CBC    Component Value Date/Time   WBC 9.8 05/15/2024 1245   RBC 4.39 05/15/2024 1245   HGB 13.6 05/15/2024 1245   HGB 11.2 11/27/2018 1537   HCT 42.8 05/15/2024 1245   HCT 33.3 (L) 11/27/2018 1537   PLT 285 05/15/2024 1245   PLT 318 11/27/2018 1537   MCV 97.5 05/15/2024 1245   MCV 90 11/27/2018 1537   MCH 31.0 05/15/2024 1245   MCHC 31.8 05/15/2024 1245  RDW 12.6 05/15/2024 1245   RDW 12.4 11/27/2018 1537   LYMPHSABS 2.7 03/05/2024 1408   LYMPHSABS 2.0 11/27/2018 1537   MONOABS 1.0 03/05/2024 1408   EOSABS 0.3 03/05/2024 1408   EOSABS 0.4 11/27/2018 1537   BASOSABS 0.1 03/05/2024 1408   BASOSABS 0.1 11/27/2018 1537    ASSESSMENT AND PLAN: 1. Type 2 diabetes mellitus with obesity (HCC) (Primary) At goal based on A1C today Continue Metformin  500 mg and Farxiga  10 mg daily Pt with poor dietary habits and admits to over eating. Agreeable to seeing a nutritionist. Consider GLP1 agent in future - POCT glucose (manual entry) - POCT glycosylated hemoglobin (Hb A1C) - Blood Glucose Monitoring Suppl (ACCU-CHEK GUIDE) w/Device KIT; Check Blood sugars daily before breakfast  Dispense: 1 kit; Refill: 0 - Accu-Chek Softclix Lancets lancets; Use as instructed. Check Blood sugars daily before breakfast  Dispense: 100 each;  Refill: 12 - glucose blood (ACCU-CHEK GUIDE TEST) test strip; Use as instructed. Check Blood sugars daily before breakfast  Dispense: 100 each; Refill: 12 - Amb ref to Medical Nutrition Therapy-MNT  2. Diabetes mellitus treated with oral medication (HCC) See #1 above  3. Hypertension associated with type 2 diabetes mellitus (HCC) At goal Continue  carvedilol  3.125 twice daily, spironolactone  25 mg daily, Furosemide  40 mg daily and valsartan  40 mg daily.   4. Hyperlipidemia associated with type 2 diabetes mellitus (HCC) Last LDL close to goal. Continue Lipitor 40 mg daily - Lipid panel  5. CKD stage 2, GFR 45-59 ml/min (HCC) Advised to avoid long-term use of NSAIDs.  Good diabetes and blood pressure control is important.  6. Sore throat (viral) Likely viral pharyngitis.  Recommend throat lozenges or gargling with salt and water.  7. Need for influenza vaccination - Flu vaccine trivalent PF, 6mos and older(Flulaval,Afluria,Fluarix,Fluzone)  8. Need for vaccination against Streptococcus pneumoniae - Pneumococcal conjugate vaccine 20-valent  Patient was given the opportunity to ask questions.  Patient verbalized understanding of the plan and was able to repeat key elements of the plan.   This documentation was completed using Paediatric nurse.  Any transcriptional errors are unintentional.  Orders Placed This Encounter  Procedures   Flu vaccine trivalent PF, 6mos and older(Flulaval,Afluria,Fluarix,Fluzone)   Pneumococcal conjugate vaccine 20-valent   Lipid panel   Amb ref to Medical Nutrition Therapy-MNT   POCT glucose (manual entry)   POCT glycosylated hemoglobin (Hb A1C)     Requested Prescriptions   Signed Prescriptions Disp Refills   Blood Glucose Monitoring Suppl (ACCU-CHEK GUIDE) w/Device KIT 1 kit 0    Sig: Check Blood sugars daily before breakfast   Accu-Chek Softclix Lancets lancets 100 each 12    Sig: Use as instructed. Check Blood sugars daily  before breakfast   glucose blood (ACCU-CHEK GUIDE TEST) test strip 100 each 12    Sig: Use as instructed. Check Blood sugars daily before breakfast    Return in about 3 months (around 10/11/2024) for Give her 3 mth f/u with her PCP [name] Bohr.  Barnie Louder, MD, FACP

## 2024-07-13 ENCOUNTER — Ambulatory Visit: Payer: Self-pay | Admitting: Internal Medicine

## 2024-07-13 LAB — LIPID PANEL
Chol/HDL Ratio: 1.9 ratio (ref 0.0–4.4)
Cholesterol, Total: 146 mg/dL (ref 100–199)
HDL: 77 mg/dL (ref >39)
LDL Chol Calc (NIH): 56 mg/dL (ref 0–99)
Triglycerides: 62 mg/dL (ref 0–149)
VLDL Cholesterol Cal: 13 mg/dL (ref 5–40)

## 2024-07-14 ENCOUNTER — Encounter: Payer: Self-pay | Admitting: Internal Medicine

## 2024-07-17 ENCOUNTER — Other Ambulatory Visit (HOSPITAL_COMMUNITY): Payer: Self-pay | Admitting: Emergency Medicine

## 2024-07-17 NOTE — Progress Notes (Unsigned)
 Paramedicine Encounter    Patient ID: Stacey Lin, female    DOB: Feb 02, 1966, 58 y.o.   MRN: [id number]   Complaints Face feels swollen  Assessment A&O x 4, skin W&D w/ good color.  Denies chest pain or SOB.  Lung sounds clear and equal bilat.  No peripheral edema.  Face does look a little puffy but she has just gotten out of bed.   Compliance with meds YES  Pill box filled x 1 week  Refills needed Farxiga , Furosemide , Carvedilol , Atorvastatin   Meds changes since last visit  Pt given glucometer to monitor blood sugar    Social changes NONE   BP (!) 140/90 (BP Location: Left Arm, Patient Position: Sitting, Cuff Size: Normal)   Pulse 80   Resp 18   Wt 177 lb (80.3 kg)   LMP  (LMP Unknown)   BMI 33.44 kg/m  Weight yesterday-177lb  Last visit weight-174lb CBG 148  ATF Stacey Lin w/o compliant.  She has been compliant w/ all meds.  Med box reconciled x 2 weeks.  Lung sounds w/ mild expiratory wheezes.  She has not done her breathing treatment yet this morning.   Discussed w/ Stacey Lin moving her meds to a [facility] closer to her home.  She lives in [place] but get her meds at [facility] on [address] in [place].  She say she'll have to think about what she wants to do. Also, pt was provided w/ a glucometer.  She was not shown how to use the device so spent approx 20 minutes w/ her showing her how to load lancets and obtain CBG results.   She also was inquiring about Diabetic Retinopathy.  I gave her brief information via Google.  I was able to share with her illustration of a normal eye and an eye w/ retinopathy.  Encouraged her to reach out to her doctor w/ further questions.  ACTION: Home visit completed  Stacey Lin [phone] 07/17/24  Patient Care Team: Celestia [name] SQUIBB, NP as PCP - General (Internal Medicine)  Patient Active Problem List   Diagnosis Date Noted   Low blood pressure, not hypotension (required pausing some of her GDMT) 04/18/2024    Pruritic intertrigo 04/18/2024   Hot flashes due to menopause 04/18/2024   Constipation 04/18/2024   Acute respiratory distress 04/05/2024   Lumbar radiculopathy 03/13/2024   CHF (congestive heart failure) (HCC) 02/29/2024   COPD with acute exacerbation (HCC) 02/28/2024   Respiratory failure with hypoxia (HCC) 11/21/2023   COPD exacerbation (HCC) 07/25/2023   Nonrheumatic aortic valve insufficiency    Elevated LFTs 04/08/2022   Acute metabolic encephalopathy 04/08/2022   Depressed mood 04/08/2022   Headache 04/07/2022   Obesity (BMI 30-39.9) 04/07/2022   Acute exacerbation of CHF (congestive heart failure) (HCC) 04/06/2022   Polysubstance abuse (HCC) 04/06/2022   Elevated troponin 04/06/2022   Controlled type 2 diabetes mellitus with hyperglycemia (HCC) 04/06/2022   Transaminitis 04/06/2022   Hypoalbuminemia 04/06/2022   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/11/2021   Upper airway cough syndrome 12/20/2018   Cigarette smoker 11/21/2018   Asthmatic bronchitis, moderate persistent, uncomplicated 11/20/2018   DOE (dyspnea on exertion) 04/14/2016   Streptococcus pneumoniae pneumonia 04/07/2016   Acute respiratory failure with hypoxia (HCC) 04/03/2016   Elevated lactic acid level 04/03/2016   Leukocytosis 04/03/2016   New onset type 2 diabetes mellitus (HCC) 04/03/2016   Abnormal urinalysis 04/03/2016   Trichomonas infection 04/03/2016   Non compliance w medication regimen 01/12/2016   COPD without exacerbation (  HCC) 12/04/2014   AKI (acute kidney injury) 03/14/2014   Acute on chronic systolic heart failure (HCC) 03/14/2014   Musculoskeletal chest pain 03/14/2014   Dyspnea 12/03/2013   Periodic health assessment, general screening, adult 10/03/2013   Mitral valve regurgitation 08/22/2013   FIBROIDS, UTERUS 05/11/2010   ANEMIA, SECONDARY TO BLOOD LOSS 05/11/2010   Asthmatic bronchitis 05/11/2010   TOBACCO USER 07/07/2009   Essential hypertension, benign 06/19/2009    MENORRHAGIA 06/19/2009   COCAINE  ABUSE, HX OF 06/19/2009    Current Outpatient Medications:    albuterol  (VENTOLIN  HFA) 108 (90 Base) MCG/ACT inhaler, Inhale 2 puffs into the lungs every 4 (four) hours as needed for wheezing or shortness of breath., Disp: 17 g, Rfl: 1   allopurinol  (ZYLOPRIM ) 300 MG tablet, Take 300 mg by mouth 2 (two) times daily., Disp: , Rfl:    atorvastatin  (LIPITOR) 40 MG tablet, Take 1 tablet (40 mg total) by mouth daily., Disp: 90 tablet, Rfl: 1   carvedilol  (COREG ) 3.125 MG tablet, Take 1 tablet (3.125 mg total) by mouth 2 (two) times daily with a meal., Disp: 60 tablet, Rfl: 3   dapagliflozin  propanediol (FARXIGA ) 10 MG TABS tablet, Take 1 tablet (10 mg total) by mouth daily., Disp: 90 tablet, Rfl: 1   furosemide  (LASIX ) 40 MG tablet, Take 1 tablet (40 mg total) by mouth daily., Disp: 30 tablet, Rfl: 3   ipratropium-albuterol  (DUONEB) 0.5-2.5 (3) MG/3ML SOLN, Take 3 mLs by nebulization 2 (two) times daily., Disp: 180 mL, Rfl: 1   metFORMIN  (GLUCOPHAGE -XR) 500 MG 24 hr tablet, Take 1 tablet (500 mg total) by mouth daily., Disp: 90 tablet, Rfl: 1   pantoprazole  (PROTONIX ) 40 MG tablet, Take 40 mg by mouth daily., Disp: , Rfl:    QUEtiapine  (SEROQUEL ) 50 MG tablet, Take 1 tablet (50 mg total) by mouth at bedtime., Disp: 90 tablet, Rfl: 1   spironolactone  (ALDACTONE ) 25 MG tablet, Take 0.5 tablets (12.5 mg total) by mouth daily., Disp: 45 tablet, Rfl: 3   valsartan  (DIOVAN ) 40 MG tablet, Take 1 tablet (40 mg total) by mouth daily., Disp: 90 tablet, Rfl: 1   Accu-Chek Softclix Lancets lancets, Use as instructed. Check Blood sugars daily before breakfast, Disp: 100 each, Rfl: 12   ammonium lactate  (LAC-HYDRIN ) 12 % lotion, Apply 1 Application topically as needed for dry skin., Disp: 400 g, Rfl: 5   Blood Glucose Monitoring Suppl (ACCU-CHEK GUIDE) w/Device KIT, Check Blood sugars daily before breakfast, Disp: 1 kit, Rfl: 0   famotidine  (PEPCID ) 20 MG tablet, One after supper  (Patient not taking: Reported on 07/17/2024), Disp: 30 tablet, Rfl: 11   fluticasone  furoate-vilanterol (BREO ELLIPTA ) 100-25 MCG/ACT AEPB, Inhale 1 puff into the lungs daily., Disp: 1 each, Rfl: 11   gabapentin  (NEURONTIN ) 300 MG capsule, TAKE ONE (1) CAPSULE BY MOUTH TWICE DAILY (Patient not taking: Reported on 07/17/2024), Disp: 60 capsule, Rfl: 10   glucose blood (ACCU-CHEK GUIDE TEST) test strip, Use as instructed. Check Blood sugars daily before breakfast, Disp: 100 each, Rfl: 12   naloxone  (NARCAN ) nasal spray 4 mg/0.1 mL, Inject intranasal for overdose (Patient taking differently: Inject intranasal for overdose), Disp: 1 each, Rfl: 1 No Known Allergies   Social History   Socioeconomic History   Marital status: Divorced    Spouse name: Not on file   Number of children: Not on file   Years of education: Not on file   Highest education level: Some college, no degree  Occupational History   Not on file  Tobacco Use   Smoking status: Former    Current packs/day: 0.00    Average packs/day: 1 pack/day for 35.0 years (35.0 ttl pk-yrs)    Types: Cigarettes    Start date: 08/25/1984    Quit date: 08/26/2019    Years since quitting: 4.8   Smokeless tobacco: Never  Vaping Use   Vaping status: Former  Substance and Sexual Activity   Alcohol use: No    Alcohol/week: 0.0 standard drinks of alcohol   Drug use: Not Currently    Frequency: 4.0 times per week    Types: Crack cocaine     Comment: RELAPSE 12/2016   Sexual activity: Not on file  Other Topics Concern   Not on file  Social History Narrative   Lives with son in an apartment on the third floor.  Does not work.  On disability.  Previously worked in [employer].    Education: high school.     Social Drivers of Corporate investment banker Strain: Low Risk  (05/06/2024)   Overall Financial Resource Strain (CARDIA)    Difficulty of Paying Living Expenses: Not very hard  Food Insecurity: No Food Insecurity (07/12/2024)   Hunger  Vital Sign    Worried About Running Out of Food in the Last Year: Never true    Ran Out of Food in the Last Year: Never true  Transportation Needs: Unmet Transportation Needs (06/10/2024)   PRAPARE - Administrator, Civil Service (Medical): Yes    Lack of Transportation (Non-Medical): Yes  Physical Activity: Unknown (10/18/2023)   Exercise Vital Sign    Days of Exercise per Week: 2 days    Minutes of Exercise per Session: Patient declined  Recent Concern: Physical Activity - Inactive (08/02/2023)   Exercise Vital Sign    Days of Exercise per Week: 1 day    Minutes of Exercise per Session: 0 min  Stress: Stress Concern Present (05/06/2024)   [employer] of Occupational Health - Occupational Stress Questionnaire    Feeling of Stress: To some extent  Social Connections: Moderately Integrated (04/05/2024)   Social Connection and Isolation Panel    Frequency of Communication with Friends and Family: More than three times a week    Frequency of Social Gatherings with Friends and Family: More than three times a week    Attends Religious Services: More than 4 times per year    Active Member of Clubs or Organizations: Yes    Attends Banker Meetings: More than 4 times per year    Marital Status: Never married  Intimate Partner Violence: Not At Risk (04/05/2024)   Humiliation, Afraid, Rape, and Kick questionnaire    Fear of Current or Ex-Partner: No    Emotionally Abused: No    Physically Abused: No    Sexually Abused: No    Physical Exam      Future Appointments  Date Time Provider Department Center  07/24/2024  1:15 PM Darlean Ozell NOVAK, MD [facility] [address]  08/13/2024 11:00 AM Fleeta Tonia Garnette LITTIE, RPH-CPP [facility] [address]  08/27/2024  1:40 PM [facility]AL WELLNESS VISIT [facility] [place] [facility]  09/05/2024 11:00 AM [facility] ECHO OP 1 [facility]  09/05/2024 12:00 PM Bensimhon, Toribio SAUNDERS, MD [facility] None  10/08/2024  2:15 PM Gaynel Delon LITTIE,  DPM [facility]  10/11/2024  8:30 AM Celestia [name] SQUIBB, NP [facility]

## 2024-07-18 ENCOUNTER — Ambulatory Visit (INDEPENDENT_AMBULATORY_CARE_PROVIDER_SITE_OTHER): Admitting: Primary Care

## 2024-07-22 ENCOUNTER — Other Ambulatory Visit (HOSPITAL_COMMUNITY): Payer: Self-pay

## 2024-07-22 DIAGNOSIS — I5022 Chronic systolic (congestive) heart failure: Secondary | ICD-10-CM

## 2024-07-22 MED ORDER — CARVEDILOL 3.125 MG PO TABS: 3.1250 mg | ORAL_TABLET | Freq: Two times a day (BID) | ORAL | 5 refills | Status: DC

## 2024-07-22 NOTE — Progress Notes (Unsigned)
 Subjective:    Patient ID: Stacey Lin, female    DOB: Mar 04, 1966   MRN: [id number]   Brief patient profile:  5 yobf  Active smoker previously seen by Dr. Corrie 12/18/14 with dyspnea on exertion.She was felt to have asthmatic bronchitis but not COPD  rec Stop spiriva  rx breo 100, take one inhalation each am proair  respiclick, 2 inhalations every 6 hrs only if you are having severe breathing issues. Work on weight loss and conditioning. Will send a note to your cardiologist to see about getting you off lisinopril  to see if this helps your dry tickling cough. You must stop smoking 100% in order to stay well.  followup with me again in 2mos to check on things> did not return.   History of Present Illness  02/12/2016: NP Follow up Office Visit: Patient presents to the office today with worsening dyspnea on exertion. She was seen at the heart failure clinic one day prior to OV   and they requested that she follow up with pulmonary. Spirometry 11/2014 showed  normal FEV1 with ? air trapping given her   FVC ? 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 She is continuing to smoke approx. 2 cigarettes daily per her history.    she has not been using her Breo inhaler daily as  Maintenance medication, but just when needed.   rec We will give you a Breo sample and new prescription for your Breo.( Maintenance inhaler) Use the Breo 1 puff twice daily. We will give you a prescription for your Pro Air Inhaler ( Rescue Inhaler) Use this every 6 hours as needed for SOB or Wheezing. Claritin ( loratadine) 1 every day for allergies Nasal Saline as needed for nasal congestion Continue weighing yourself daily per the heart failure clinic Follow up in 1 month > did  Not return        11/20/2018  Acute  ov/Chareese Sergent re: worse since stopped symbicort   Chief Complaint  Patient presents with   Acute Visit    Increased SOB for the past 2 months. She gets winded walking from room to room at home. She also c/o  occ cough and wheezing. Her cough is mainly non prod.  She is using her albuterol  inhaler 2 x per wk on average.   Dyspnea:  Gradually worse x 2 months to point to doe  Room to room = MMRC3  Cough: acutely worse x 2 days / harsh esp daytime > noct  Sleeping: on back with 2 pillows SABA use: confused with which ones help and when to take  rec Plan A = Automatic = symbicort  80 Take 2 puffs first thing in am and then another 2 puffs about 12 hours later.  Work on inhaler technique  The key is to stop smoking completely before smoking completely stops you!  Plan B = Backup Only use your albuterol  (ventolin ) inhaler  Please schedule a follow up office visit in 2  weeks, call sooner if needed with all medications /inhalers/ solutions in hand so we can verify exactly what you are taking. This includes all medications from all doctors and over the counters - PLEASE separate them into two bags:  the ones you take automatically, no matter what, vs the ones you take just when you feel you need them BAG #2 is UP TO YOU  - this will really help us  help you take your medications more effectively.  - add: Prednisone  10 mg take  4 each am x 2 days,  2 each am x 2 days,  1 each am x 2 days and stop and spirometry on return        Admit date: 02/28/2024 Discharge date: 03/03/2024  Discharge Diagnoses:  Acute hypoxic respiratory failure Acute on chronic systolic CHF Pulmonary hypertension Mitral regurgitation COPD Polysubstance abuse Ongoing tobacco use Cocaine  use Type 2 diabetes mellitus Depression         03/05/2024  f/u ov/Dolton Shaker re: severe cough always better in hospital    maint on ? Really not clear on her meds  - did not bring them   - has not been able to leave house comfortable xmas 2024  Chief Complaint  Patient presents with   Hospitalization Follow-up    COPD flair up.  Still having problems with breathing.  Dyspnea:  doe across the room  Cough: 24/7 assoc with nasal congestion   Sleeping: flat bed with 2 pillows  resp cc  SABA use: has not used neb yet on day of ov at 2 pm  02: none  Rec Depomedrol 120 mg IM  Guaifenesin  with codeine  1 tsp every 4 hours as needed  Stop losartan  and start valsartan  40 mg daily in its place Duoneb 4 x times a day as planned and also start your prednisone   Pantoprazole  (protonix ) 40 mg   Take  30-60 min before first meal of the day and Pepcid  (famotidine )  20 mg after supper until return to office   GERD diet reviewed, bed blocks rec  Please schedule a follow up office visit in 4 weeks, sooner if needed  with all medications /inhalers/ solutions in hand   Cxr CM/ o/w ok      07/24/2024  f/u ov/Aidan Moten re: severe cough always better in hospital  quit smoking Aug 2024   maint on BREO  did not  bring meds  order  LCS Chief Complaint  Patient presents with   Medication Management   Bronchitis    Breathing is unchanged since last visit   Dyspnea:  10 ft  (125 ft here with no desats) Cough: dry day >> noct  Sleeping: flat bed / 2 pillows s resp cc  SABA use: not sure  02: none  Lung cancer screening :  referred     No obvious day to day or daytime variability or assoc excess/ purulent sputum or mucus plugs or hemoptysis or cp or chest tightness, subjective wheeze or overt sinus or hb symptoms.    Also denies any obvious fluctuation of symptoms with weather or environmental changes or other aggravating or alleviating factors except as outlined above   No unusual exposure hx or h/o childhood pna/ asthma or knowledge of premature birth.  Current Allergies, Complete Past Medical History, Past Surgical History, Family History, and Social History were reviewed in [employer] record.  ROS  The following are not active complaints unless bolded Hoarseness, sore throat, dysphagia, dental problems, itching, sneezing,  nasal congestion or discharge of excess mucus or purulent secretions, ear ache,   fever, chills,  sweats, unintended wt loss or wt gain, classically pleuritic or exertional cp,  orthopnea pnd or arm/hand swelling  or leg swelling, presyncope, palpitations, abdominal pain, anorexia, nausea, vomiting, diarrhea  or change in bowel habits or change in bladder habits, change in stools or change in urine, dysuria, hematuria,  rash, arthralgias, visual complaints, headache, numbness, weakness or ataxia or problems with walking or coordination,  change in mood or  memory.  Current Meds  Medication Sig   Accu-Chek Softclix Lancets lancets Use as instructed. Check Blood sugars daily before breakfast   albuterol  (VENTOLIN  HFA) 108 (90 Base) MCG/ACT inhaler Inhale 2 puffs into the lungs every 4 (four) hours as needed for wheezing or shortness of breath.   allopurinol  (ZYLOPRIM ) 300 MG tablet Take 300 mg by mouth 2 (two) times daily.   ammonium lactate  (LAC-HYDRIN ) 12 % lotion Apply 1 Application topically as needed for dry skin.   atorvastatin  (LIPITOR) 40 MG tablet Take 1 tablet (40 mg total) by mouth daily.   Blood Glucose Monitoring Suppl (ACCU-CHEK GUIDE) w/Device KIT Check Blood sugars daily before breakfast   budesonide  (PULMICORT ) 0.25 MG/2ML nebulizer solution One vial twice daily with albuterol  in nebulizer   carvedilol  (COREG ) 3.125 MG tablet Take 1 tablet (3.125 mg total) by mouth 2 (two) times daily with a meal.   dapagliflozin  propanediol (FARXIGA ) 10 MG TABS tablet Take 1 tablet (10 mg total) by mouth daily.   famotidine  (PEPCID ) 20 MG tablet One after supper   fluticasone  furoate-vilanterol (BREO ELLIPTA ) 100-25 MCG/ACT AEPB Inhale 1 puff into the lungs daily.   furosemide  (LASIX ) 40 MG tablet Take 1 tablet (40 mg total) by mouth daily.   gabapentin  (NEURONTIN ) 300 MG capsule TAKE ONE (1) CAPSULE BY MOUTH TWICE DAILY   glucose blood (ACCU-CHEK GUIDE TEST) test strip Use as instructed. Check Blood sugars daily before breakfast   ipratropium-albuterol  (DUONEB) 0.5-2.5 (3) MG/3ML SOLN  Take 3 mLs by nebulization 2 (two) times daily.   metFORMIN  (GLUCOPHAGE -XR) 500 MG 24 hr tablet Take 1 tablet (500 mg total) by mouth daily.   naloxone  (NARCAN ) nasal spray 4 mg/0.1 mL Inject intranasal for overdose (Patient taking differently: Inject intranasal for overdose)   pantoprazole  (PROTONIX ) 40 MG tablet Take 40 mg by mouth daily.   QUEtiapine  (SEROQUEL ) 50 MG tablet Take 1 tablet (50 mg total) by mouth at bedtime. (Patient taking differently: Take 1 tablet (50 mg total) by mouth at bedtime.)   spironolactone  (ALDACTONE ) 25 MG tablet Take 0.5 tablets (12.5 mg total) by mouth daily.   valsartan  (DIOVAN ) 40 MG tablet Take 1 tablet (40 mg total) by mouth daily.                   Objective:   Physical Exam   wts  07/24/2024        173  03/05/2024        172   03/05/2021        173 09/02/2019        200  08/16/2019      192 12/19/2018        202  12/04/2018        205   11/20/18 201 lb 6.4 oz (91.4 kg)  10/30/18 197 lb (89.4 kg)  09/17/18 198 lb (89.8 kg)      Vital signs reviewed  07/24/2024  - Note at rest 02 sats  95% on RA   General appearance:    elderly bf on phone, harsh mostly dry sounding cough    HEENT : Oropharynx  clear      Nasal turbinates nl    NECK :  without  apparent JVD/ palpable Nodes/TM    LUNGS: no acc muscle use,  Nl contour chest which is clear to A and P bilaterally without cough on insp or exp maneuvers   CV:  RRR  no s3 or murmur or increase in P2, and trace bilateral pitting LE  edema  ABD:  soft and nontender   MS:  Gait slow   ext warm without deformities Or obvious joint restrictions  calf tenderness, cyanosis or clubbing    SKIN: warm and dry without lesions    NEURO:  alert, approp, nl sensorium with  no motor or cerebellar deficits apparent.            Assessment & Plan:    Assessment & Plan Asthmatic bronchitis, moderate persistent, uncomplicated Quit smoking 11//2020  PFTs 10/2013:  FEV1 0.86 (41%), ratio 67, TLC 58%,  unable to do DLCO Spiro as part of CPST 02/2014:  FEV1 1.19 (59%), ratio 72 Spiro 11/2014:  FEV1 1.08 (54%), ratio 80 - Spirometry 04/14/2016  FEV1 1.32 (68%)  Ratio 79 with nl effort indep portion of f/v loop  11/20/2018   resume symb 80 2bid x 2 week sample plus prednisone   X 6 days  then regroup with all meds in hand > did not do and as of 12/04/2018 no prednisone  taken/still smoking  - Allergy  profile 12/19/2018 >  Eos 0. /  IgE 8 rast neg  - 03/05/2021  After extensive coaching inhaler device,  effectiveness =    25% at baseline > try breztri  sample one bid x 4 week samples then ov with all meds / depomedrol 80 mg IM  And prn saba / max gerd rx  - budesonide  0.25 mg bid per neb with albuterol  07/24/2024 >>>   Her main symptoms today are all upper airway and the BREO is not a good choice in that setting so will replace wit alb/ bud 0.25 bid per neb and stop all the other devices for now   Control cough with delsym / depomedrol 120 mg IM >>> also added depomedrol 120 mg IM  in case of component of Th-2 driven upper or lower airways inflammation (if cough responds short term only to relapse before return while will on full rx for uacs (as above), then that would point to allergic rhinitis/ asthma or eos bronchitis as alternative dx)    Cigarette smoker Says she's not smoking since aug 2025 > reinforced maint abstience  >>>   never got there LDSCT > referred again    Upper airway cough syndrome Onset Nov 2019 while on entresto  - Allergy  profile 12/19/2018 >  Eos 0.5/  IgE  8  RAST neg  - d/c all mints 12/19/2018  And rx tessilon  - 08/16/19  gabapentin  100 qid > slt better 09/02/2019  - 09/02/2019 rec change gabapentin  to 300 tid and consider stopping entresto > stopped ? 12/26/19  > improved - 03/05/2021 flare of cough off gerd rx > resumed gerd rx / depomedrol 80 mg IM   - 07/24/2024 flared spring of 2025 > restart gabapentin  300 mg bid with max rx for gerd and regropup in 4 weeks with all meds in hand  using a trust but verify approach to confirm accurate Medication  Reconciliation The principal here is that until we are certain that the  patients are doing what we've asked, it makes no sense to ask them to do more.    DOE (dyspnea on exertion) 09/02/2019   Walked RA x one lap =  approx 100- stopped due to sob with sats still 98% at slow pace  - 07/24/2024     approx 125   ft  @ slow  pace, stopped due to sob  with lowest 02 sats 100%   No role for 02 / no evidence of obvious  chf/ ab esp since no noct cough or orthopnea   Each maintenance medication was reviewed in detail including emphasizing most importantly the difference between maintenance and prns and under what circumstances the prns are to be triggered using an action plan format where appropriate.  Total time for H and P, chart review, counseling, reviewing neb device(s) , directly observing portions of ambulatory 02 saturation study/ and generating customized AVS unique to this office visit / same day charting = 40 min  for multiple  refractory respiratory  symptoms of uncertain etiology                AVS  Patient Instructions  Stop BREO  and start albuterol  2.5 mg and add  budesonide  0.25mg   twice daily   Pantoprazole  40 mg Take 30-60 min before first meal of the day and continue pepcid  20 mg after supper   Gabapentn 300 mg twice daily - not as needed  Delsym  2 tsp twice daily as needed or cough   Depomedrol 120 mg IM   No mint menthol  or chocolate    My office will be contacting you by phone for referral to lung cancer screening   [phone]) - if you don't hear back from my office within one week,  please call us  back or notify us  thru MyChart and we'll address it right away.     .Please schedule a follow up office visit in 4 weeks, sooner if needed  with all medications /inhalers/ solutions in hand so we can verify exactly what you are taking. This includes all medications from all doctors and over the counters         Ozell America, MD 07/24/2024

## 2024-07-24 ENCOUNTER — Other Ambulatory Visit: Payer: Self-pay | Admitting: Pharmacist

## 2024-07-24 ENCOUNTER — Other Ambulatory Visit (HOSPITAL_COMMUNITY): Payer: Self-pay | Admitting: Emergency Medicine

## 2024-07-24 ENCOUNTER — Ambulatory Visit: Admitting: Internal Medicine

## 2024-07-24 ENCOUNTER — Encounter: Payer: Self-pay | Admitting: Internal Medicine

## 2024-07-24 VITALS — BP 132/74 | HR 86 | Temp 98.6°F | Ht 60.0 in | Wt 173.0 lb

## 2024-07-24 DIAGNOSIS — F17210 Nicotine dependence, cigarettes, uncomplicated: Secondary | ICD-10-CM | POA: Diagnosis not present

## 2024-07-24 DIAGNOSIS — R058 Other specified cough: Secondary | ICD-10-CM

## 2024-07-24 DIAGNOSIS — J4540 Moderate persistent asthma, uncomplicated: Secondary | ICD-10-CM

## 2024-07-24 DIAGNOSIS — R0609 Other forms of dyspnea: Secondary | ICD-10-CM

## 2024-07-24 MED ORDER — METHYLPREDNISOLONE ACETATE 80 MG/ML IJ SUSP
120.0000 mg | Freq: Once | INTRAMUSCULAR | Status: AC
  Administered 2024-07-24: 120 mg via INTRAMUSCULAR

## 2024-07-24 MED ORDER — BUDESONIDE 0.25 MG/2ML IN SUSP
RESPIRATORY_TRACT | 12 refills | Status: AC
Start: 2024-07-24 — End: ?

## 2024-07-24 NOTE — Patient Instructions (Addendum)
 Stop BREO  and start albuterol  2.5 mg and add  budesonide  0.25mg   twice daily   Pantoprazole  40 mg Take 30-60 min before first meal of the day and continue pepcid  20 mg after supper   Gabapentn 300 mg twice daily - not as needed  Delsym  2 tsp twice daily as needed or cough   Depomedrol 120 mg IM   No mint menthol  or chocolate    My office will be contacting you by phone for referral to lung cancer screening   [phone] - if you don't hear back from my office within one week,  please call us  back or notify us  thru MyChart and we'll address it right away.     .Please schedule a follow up office visit in 4 weeks, sooner if needed  with all medications /inhalers/ solutions in hand so we can verify exactly what you are taking. This includes all medications from all doctors and over the counters

## 2024-07-24 NOTE — Assessment & Plan Note (Addendum)
 Quit smoking 11//2020  PFTs 10/2013:  FEV1 0.86 (41%), ratio 67, TLC 58%, unable to do DLCO Spiro as part of CPST 02/2014:  FEV1 1.19 (59%), ratio 72 Spiro 11/2014:  FEV1 1.08 (54%), ratio 80 - Spirometry 04/14/2016  FEV1 1.32 (68%)  Ratio 79 with nl effort indep portion of f/v loop  11/20/2018   resume symb 80 2bid x 2 week sample plus prednisone   X 6 days  then regroup with all meds in hand > did not do and as of 12/04/2018 no prednisone  taken/still smoking  - Allergy  profile 12/19/2018 >  Eos 0. /  IgE 8 rast neg  - 03/05/2021  After extensive coaching inhaler device,  effectiveness =    25% at baseline > try breztri  sample one bid x 4 week samples then ov with all meds / depomedrol 80 mg IM  And prn saba / max gerd rx  - budesonide  0.25 mg bid per neb with albuterol  07/24/2024 >>>   Her main symptoms today are all upper airway and the BREO is not a good choice in that setting so will replace wit alb/ bud 0.25 bid per neb and stop all the other devices for now   Control cough with delsym / depomedrol 120 mg IM >>> also added depomedrol 120 mg IM  in case of component of Th-2 driven upper or lower airways inflammation (if cough responds short term only to relapse before return while will on full rx for uacs (as above), then that would point to allergic rhinitis/ asthma or eos bronchitis as alternative dx)

## 2024-07-24 NOTE — Assessment & Plan Note (Addendum)
 Says she's not smoking since aug 2025 > reinforced maint abstience  >>>   never got there LDSCT > referred again

## 2024-07-24 NOTE — Addendum Note (Signed)
 Addended by: Emilyann Banka M on: 07/24/2024 01:52 PM   Modules accepted: Orders

## 2024-07-24 NOTE — Progress Notes (Unsigned)
 Paramedicine Encounter    Patient ID: Stacey Lin, female    DOB: September 05, 1966, 58 y.o.   MRN: [id number]   Complaints Difficulty sleeping  Assessment A&O x 4, skin W&D w/ good color.  Denies chest pain.  Still has baseline exertional SOB.  Lung sounds w/ mild expiratory wheezes.  No peripheral edema noted.  Compliance with meds No  Pill box filled  x 2 weeks  Refills needed NONE  Meds changes since last visit NONE    Social changes NONE   BP 110/76 (BP Location: Left Arm, Patient Position: Sitting, Cuff Size: Normal)   Pulse 95   Resp 18   LMP  (LMP Unknown)   SpO2 98%  Weight yesterday- not taken Last visit weight-177lb  CBG 150  ATF Ms. Schweizer A&O x 4, skin W&D w/ good color.  Med rec x 1 week.  Pt is out of Quietapine early and admits to doubling up due to having difficulty sleeping.  Reached out to [employer] reference this and he advised he will reach out to provider for an alternative sleep aid.   Discussed w/ pt the hazards of not taking meds specifically as prescribed and she states she understands. Delivered Carvedilol  prescription  ACTION: Home visit completed  Stacey Lin [phone] 07/25/24  Patient Care Team: Celestia [name] SQUIBB, NP as PCP - General (Internal Medicine)  Patient Active Problem List   Diagnosis Date Noted   Low blood pressure, not hypotension (required pausing some of her GDMT) 04/18/2024   Pruritic intertrigo 04/18/2024   Hot flashes due to menopause 04/18/2024   Constipation 04/18/2024   Acute respiratory distress 04/05/2024   Lumbar radiculopathy 03/13/2024   CHF (congestive heart failure) (HCC) 02/29/2024   COPD with acute exacerbation (HCC) 02/28/2024   Respiratory failure with hypoxia (HCC) 11/21/2023   COPD exacerbation (HCC) 07/25/2023   Nonrheumatic aortic valve insufficiency    Elevated LFTs 04/08/2022   Acute metabolic encephalopathy 04/08/2022   Depressed mood 04/08/2022   Headache 04/07/2022    Obesity (BMI 30-39.9) 04/07/2022   Acute exacerbation of CHF (congestive heart failure) (HCC) 04/06/2022   Polysubstance abuse (HCC) 04/06/2022   Elevated troponin 04/06/2022   Controlled type 2 diabetes mellitus with hyperglycemia (HCC) 04/06/2022   Transaminitis 04/06/2022   Hypoalbuminemia 04/06/2022   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/11/2021   Upper airway cough syndrome 12/20/2018   Cigarette smoker 11/21/2018   Asthmatic bronchitis, moderate persistent, uncomplicated 11/20/2018   DOE (dyspnea on exertion) 04/14/2016   Streptococcus pneumoniae pneumonia 04/07/2016   Acute respiratory failure with hypoxia (HCC) 04/03/2016   Elevated lactic acid level 04/03/2016   Leukocytosis 04/03/2016   New onset type 2 diabetes mellitus (HCC) 04/03/2016   Abnormal urinalysis 04/03/2016   Trichomonas infection 04/03/2016   Non compliance w medication regimen 01/12/2016   COPD without exacerbation (HCC) 12/04/2014   AKI (acute kidney injury) 03/14/2014   Acute on chronic systolic heart failure (HCC) 03/14/2014   Musculoskeletal chest pain 03/14/2014   Dyspnea 12/03/2013   Periodic health assessment, general screening, adult 10/03/2013   Mitral valve regurgitation 08/22/2013   FIBROIDS, UTERUS 05/11/2010   ANEMIA, SECONDARY TO BLOOD LOSS 05/11/2010   Asthmatic bronchitis 05/11/2010   TOBACCO USER 07/07/2009   Essential hypertension, benign 06/19/2009   MENORRHAGIA 06/19/2009   COCAINE  ABUSE, HX OF 06/19/2009    Current Outpatient Medications:    albuterol  (VENTOLIN  HFA) 108 (90 Base) MCG/ACT inhaler, Inhale 2 puffs into the lungs every 4 (four) hours as needed for  wheezing or shortness of breath., Disp: 17 g, Rfl: 1   allopurinol  (ZYLOPRIM ) 300 MG tablet, Take 300 mg by mouth 2 (two) times daily., Disp: , Rfl:    ammonium lactate  (LAC-HYDRIN ) 12 % lotion, Apply 1 Application topically as needed for dry skin., Disp: 400 g, Rfl: 5   atorvastatin  (LIPITOR) 40 MG tablet, Take 1 tablet  (40 mg total) by mouth daily., Disp: 90 tablet, Rfl: 1   carvedilol  (COREG ) 3.125 MG tablet, Take 1 tablet (3.125 mg total) by mouth 2 (two) times daily with a meal., Disp: 60 tablet, Rfl: 5   dapagliflozin  propanediol (FARXIGA ) 10 MG TABS tablet, Take 1 tablet (10 mg total) by mouth daily., Disp: 90 tablet, Rfl: 1   fluticasone  furoate-vilanterol (BREO ELLIPTA ) 100-25 MCG/ACT AEPB, Inhale 1 puff into the lungs daily., Disp: 1 each, Rfl: 11   furosemide  (LASIX ) 40 MG tablet, Take 1 tablet (40 mg total) by mouth daily., Disp: 30 tablet, Rfl: 3   ipratropium-albuterol  (DUONEB) 0.5-2.5 (3) MG/3ML SOLN, Take 3 mLs by nebulization 2 (two) times daily., Disp: 180 mL, Rfl: 1   metFORMIN  (GLUCOPHAGE -XR) 500 MG 24 hr tablet, Take 1 tablet (500 mg total) by mouth daily., Disp: 90 tablet, Rfl: 1   naloxone  (NARCAN ) nasal spray 4 mg/0.1 mL, Inject intranasal for overdose (Patient taking differently: Inject intranasal for overdose), Disp: 1 each, Rfl: 1   pantoprazole  (PROTONIX ) 40 MG tablet, Take 40 mg by mouth daily., Disp: , Rfl:    QUEtiapine  (SEROQUEL ) 50 MG tablet, Take 1 tablet (50 mg total) by mouth at bedtime. (Patient taking differently: Take 1 tablet (50 mg total) by mouth at bedtime.), Disp: 90 tablet, Rfl: 1   spironolactone  (ALDACTONE ) 25 MG tablet, Take 0.5 tablets (12.5 mg total) by mouth daily., Disp: 45 tablet, Rfl: 3   valsartan  (DIOVAN ) 40 MG tablet, Take 1 tablet (40 mg total) by mouth daily., Disp: 90 tablet, Rfl: 1   Accu-Chek Softclix Lancets lancets, Use as instructed. Check Blood sugars daily before breakfast, Disp: 100 each, Rfl: 12   Blood Glucose Monitoring Suppl (ACCU-CHEK GUIDE) w/Device KIT, Check Blood sugars daily before breakfast, Disp: 1 kit, Rfl: 0   budesonide  (PULMICORT ) 0.25 MG/2ML nebulizer solution, One vial twice daily with albuterol  in nebulizer, Disp: 120 mL, Rfl: 12   famotidine  (PEPCID ) 20 MG tablet, One after supper, Disp: 30 tablet, Rfl: 11   gabapentin  (NEURONTIN )  300 MG capsule, TAKE ONE (1) CAPSULE BY MOUTH TWICE DAILY, Disp: 60 capsule, Rfl: 10   glucose blood (ACCU-CHEK GUIDE TEST) test strip, Use as instructed. Check Blood sugars daily before breakfast, Disp: 100 each, Rfl: 12 No Known Allergies   Social History   Socioeconomic History   Marital status: Divorced    Spouse name: Not on file   Number of children: Not on file   Years of education: Not on file   Highest education level: Some college, no degree  Occupational History   Not on file  Tobacco Use   Smoking status: Former    Current packs/day: 0.00    Average packs/day: 1 pack/day for 35.0 years (35.0 ttl pk-yrs)    Types: Cigarettes    Start date: 08/25/1984    Quit date: 08/26/2019    Years since quitting: 4.9   Smokeless tobacco: Never  Vaping Use   Vaping status: Former  Substance and Sexual Activity   Alcohol use: No    Alcohol/week: 0.0 standard drinks of alcohol   Drug use: Not Currently  Frequency: 4.0 times per week    Types: Crack cocaine     Comment: RELAPSE 12/2016   Sexual activity: Not on file  Other Topics Concern   Not on file  Social History Narrative   Lives with son in an apartment on the third floor.  Does not work.  On disability.  Previously worked in [employer].    Education: high school.     Social Drivers of Corporate investment banker Strain: Low Risk  (05/06/2024)   Overall Financial Resource Strain (CARDIA)    Difficulty of Paying Living Expenses: Not very hard  Food Insecurity: No Food Insecurity (07/12/2024)   Hunger Vital Sign    Worried About Running Out of Food in the Last Year: Never true    Ran Out of Food in the Last Year: Never true  Transportation Needs: Unmet Transportation Needs (06/10/2024)   PRAPARE - Administrator, Civil Service (Medical): Yes    Lack of Transportation (Non-Medical): Yes  Physical Activity: Unknown (10/18/2023)   Exercise Vital Sign    Days of Exercise per Week: 2 days    Minutes of Exercise  per Session: Patient declined  Recent Concern: Physical Activity - Inactive (08/02/2023)   Exercise Vital Sign    Days of Exercise per Week: 1 day    Minutes of Exercise per Session: 0 min  Stress: Stress Concern Present (05/06/2024)   [employer] of Occupational Health - Occupational Stress Questionnaire    Feeling of Stress: To some extent  Social Connections: Moderately Integrated (04/05/2024)   Social Connection and Isolation Panel    Frequency of Communication with Friends and Family: More than three times a week    Frequency of Social Gatherings with Friends and Family: More than three times a week    Attends Religious Services: More than 4 times per year    Active Member of Clubs or Organizations: Yes    Attends Banker Meetings: More than 4 times per year    Marital Status: Never married  Intimate Partner Violence: Not At Risk (04/05/2024)   Humiliation, Afraid, Rape, and Kick questionnaire    Fear of Current or Ex-Partner: No    Emotionally Abused: No    Physically Abused: No    Sexually Abused: No    Physical Exam      Future Appointments  Date Time Provider Department Center  08/13/2024 11:00 AM Fleeta Morris, Garnette CROME, RPH-CPP [facility] [address]  08/27/2024  1:40 PM [facility]AL WE[facility]NESS VISIT [facility] [place]  09/05/2024 11:00 AM [facility] ECHO OP 1 [facility]  09/05/2024 12:00 PM Bensimhon, Toribio SAUNDERS, MD [facility] None  09/17/2024 11:30 AM Darlean Ozell NOVAK, MD [facility] [address]  10/08/2024  2:15 PM Gaynel Delon CROME, DPM [facility]  10/11/2024  8:30 AM Celestia [name] SQUIBB, NP [facility] [place]

## 2024-07-24 NOTE — Assessment & Plan Note (Addendum)
 09/02/2019   Walked RA x one lap =  approx 100- stopped due to sob with sats still 98% at slow pace  - 07/24/2024     approx 125   ft  @ slow  pace, stopped due to sob  with lowest 02 sats 100%   No role for 02 / no evidence of obvious chf/ ab esp since no noct cough or orthopnea   Each maintenance medication was reviewed in detail including emphasizing most importantly the difference between maintenance and prns and under what circumstances the prns are to be triggered using an action plan format where appropriate.  Total time for H and P, chart review, counseling, reviewing neb device(s) , directly observing portions of ambulatory 02 saturation study/ and generating customized AVS unique to this office visit / same day charting = 40 min  for multiple  refractory respiratory  symptoms of uncertain etiology

## 2024-07-24 NOTE — Assessment & Plan Note (Addendum)
 Onset Nov 2019 while on entresto  - Allergy  profile 12/19/2018 >  Eos 0.5/  IgE  8  RAST neg  - d/c all mints 12/19/2018  And rx tessilon  - 08/16/19  gabapentin  100 qid > slt better 09/02/2019  - 09/02/2019 rec change gabapentin  to 300 tid and consider stopping entresto > stopped ? 12/26/19  > improved - 03/05/2021 flare of cough off gerd rx > resumed gerd rx / depomedrol 80 mg IM   - 07/24/2024 flared spring of 2025 > restart gabapentin  300 mg bid with max rx for gerd and regropup in 4 weeks with all meds in hand using a trust but verify approach to confirm accurate Medication  Reconciliation The principal here is that until we are certain that the  patients are doing what we've asked, it makes no sense to ask them to do more.

## 2024-07-24 NOTE — Progress Notes (Signed)
 Pharmacy Quality Measure Review  This patient is appearing on a report for being at risk of failing the adherence measure for cholesterol (statin) and diabetes medications this calendar year.   Medication: atorvastatin  Last fill date: 07/04/2024 for 30 day supply  Medication: dapagliflozin   Last fill date: 07/22/2024 for 30 day supply  Medication: metformin  Last fill date: 07/04/2024 for 30 day supply  Medication: valsartan  Last fill date: 05/16/2024 for 90 day supply.   Refills are up to date. Reminder set for next fills.   Herlene Fleeta Morris, PharmD, JAQUELINE, CPP Clinical Pharmacist [facility] [phone]

## 2024-07-25 ENCOUNTER — Telehealth: Payer: Self-pay | Admitting: Pharmacist

## 2024-07-25 ENCOUNTER — Other Ambulatory Visit: Payer: Self-pay | Admitting: Nurse Practitioner

## 2024-07-25 DIAGNOSIS — G4709 Other insomnia: Secondary | ICD-10-CM

## 2024-07-25 MED ORDER — GABAPENTIN 300 MG PO CAPS: 300.0000 mg | ORAL_CAPSULE | Freq: Two times a day (BID) | ORAL | 6 refills | Status: AC

## 2024-07-25 MED ORDER — TRAZODONE HCL 50 MG PO TABS: 50.0000 mg | ORAL_TABLET | Freq: Every evening | ORAL | 0 refills | Status: DC | PRN

## 2024-07-25 MED ORDER — FAMOTIDINE 20 MG PO TABS: ORAL_TABLET | ORAL | 1 refills | Status: AC

## 2024-07-25 NOTE — Telephone Encounter (Signed)
 Trazodone  sent. If no improvement she will need to follow up with her PCP

## 2024-07-25 NOTE — Telephone Encounter (Signed)
Unable to reach patient by phone. Voicemail left to return call.

## 2024-07-25 NOTE — Telephone Encounter (Signed)
 Received request from [name], paramedic on our paramedicine team. Sent in refills for famotidine  and gabapentin . Also reported that she has been taking 2 tablets of the quetiapine  (100 mg total) instead of the 1 tablet (50mg ) prescribed. Even then, she reports quetiapine  is not working for her insomnia and wants to try something different. Routing to the provider covering for [name] to see if we can try something different.

## 2024-07-27 ENCOUNTER — Other Ambulatory Visit: Payer: Self-pay | Admitting: Internal Medicine

## 2024-07-30 NOTE — Telephone Encounter (Signed)
 Pt is requesting refill on protonix  - in LOV you stated to continue this but its a historical provider who prescribed this. Is it okay for me to fill under your name?

## 2024-07-31 ENCOUNTER — Other Ambulatory Visit (HOSPITAL_COMMUNITY): Payer: Self-pay | Admitting: Emergency Medicine

## 2024-07-31 NOTE — Progress Notes (Unsigned)
 Paramedicine Encounter    Patient ID: Stacey Lin, female    DOB: 1966/04/30, 58 y.o.   MRN: [id number]   Complaints***  Assessment***  Compliance with meds***  Pill box filled***  Refills needed Atorvastatin , Valsartan , Metformin   Meds changes since last visit***    Social changes***   BP 100/62 (BP Location: Left Arm, Patient Position: Sitting, Cuff Size: Normal)   Pulse 80   Resp 18   Wt 175 lb 12.8 oz (79.7 kg)   LMP  (LMP Unknown)   SpO2 96%   BMI 34.33 kg/m  Weight yesterday- not taken Last visit weight-177lb  ACTION: {Paramed [phone]}  Mary Sharps, EMT-Paramedic [phone] 07/31/24  Patient Care Team: Celestia [name] SQUIBB, NP as PCP - General (Internal Medicine)  Patient Active Problem List   Diagnosis Date Noted  . Low blood pressure, not hypotension (required pausing some of her GDMT) 04/18/2024  . Pruritic intertrigo 04/18/2024  . Hot flashes due to menopause 04/18/2024  . Constipation 04/18/2024  . Acute respiratory distress 04/05/2024  . Lumbar radiculopathy 03/13/2024  . CHF (congestive heart failure) (HCC) 02/29/2024  . COPD with acute exacerbation (HCC) 02/28/2024  . Respiratory failure with hypoxia (HCC) 11/21/2023  . COPD exacerbation (HCC) 07/25/2023  . Nonrheumatic aortic valve insufficiency   . Elevated LFTs 04/08/2022  . Acute metabolic encephalopathy 04/08/2022  . Depressed mood 04/08/2022  . Headache 04/07/2022  . Obesity (BMI 30-39.9) 04/07/2022  . Acute exacerbation of CHF (congestive heart failure) (HCC) 04/06/2022  . Polysubstance abuse (HCC) 04/06/2022  . Elevated troponin 04/06/2022  . Controlled type 2 diabetes mellitus with hyperglycemia (HCC) 04/06/2022  . Transaminitis 04/06/2022  . Hypoalbuminemia 04/06/2022  . 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/11/2021  . Upper airway cough syndrome 12/20/2018  . Cigarette smoker 11/21/2018  . Asthmatic bronchitis, moderate persistent, uncomplicated 11/20/2018   . DOE (dyspnea on exertion) 04/14/2016  . Streptococcus pneumoniae pneumonia 04/07/2016  . Acute respiratory failure with hypoxia (HCC) 04/03/2016  . Elevated lactic acid level 04/03/2016  . Leukocytosis 04/03/2016  . New onset type 2 diabetes mellitus (HCC) 04/03/2016  . Abnormal urinalysis 04/03/2016  . Trichomonas infection 04/03/2016  . Non compliance w medication regimen 01/12/2016  . COPD without exacerbation (HCC) 12/04/2014  . AKI (acute kidney injury) 03/14/2014  . Acute on chronic systolic heart failure (HCC) 03/14/2014  . Musculoskeletal chest pain 03/14/2014  . Dyspnea 12/03/2013  . Periodic health assessment, general screening, adult 10/03/2013  . Mitral valve regurgitation 08/22/2013  . FIBROIDS, UTERUS 05/11/2010  . ANEMIA, SECONDARY TO BLOOD LOSS 05/11/2010  . Asthmatic bronchitis 05/11/2010  . TOBACCO USER 07/07/2009  . Essential hypertension, benign 06/19/2009  . MENORRHAGIA 06/19/2009  . COCAINE  ABUSE, HX OF 06/19/2009    Current Outpatient Medications:  .  albuterol  (VENTOLIN  HFA) 108 (90 Base) MCG/ACT inhaler, Inhale 2 puffs into the lungs every 4 (four) hours as needed for wheezing or shortness of breath., Disp: 17 g, Rfl: 1 .  allopurinol  (ZYLOPRIM ) 300 MG tablet, Take 300 mg by mouth 2 (two) times daily., Disp: , Rfl:  .  ammonium lactate  (LAC-HYDRIN ) 12 % lotion, Apply 1 Application topically as needed for dry skin., Disp: 400 g, Rfl: 5 .  atorvastatin  (LIPITOR) 40 MG tablet, Take 1 tablet (40 mg total) by mouth daily., Disp: 90 tablet, Rfl: 1 .  carvedilol  (COREG ) 3.125 MG tablet, Take 1 tablet (3.125 mg total) by mouth 2 (two) times daily with a meal., Disp: 60 tablet, Rfl: 5 .  dapagliflozin  propanediol (FARXIGA ) 10 MG  TABS tablet, Take 1 tablet (10 mg total) by mouth daily., Disp: 90 tablet, Rfl: 1 .  famotidine  (PEPCID ) 20 MG tablet, One after supper, Disp: 90 tablet, Rfl: 1 .  furosemide  (LASIX ) 40 MG tablet, Take 1 tablet (40 mg total) by mouth  daily., Disp: 30 tablet, Rfl: 3 .  gabapentin  (NEURONTIN ) 300 MG capsule, Take 1 capsule (300 mg total) by mouth 2 (two) times daily., Disp: 60 capsule, Rfl: 6 .  ipratropium-albuterol  (DUONEB) 0.5-2.5 (3) MG/3ML SOLN, Take 3 mLs by nebulization 2 (two) times daily., Disp: 180 mL, Rfl: 1 .  metFORMIN  (GLUCOPHAGE -XR) 500 MG 24 hr tablet, Take 1 tablet (500 mg total) by mouth daily., Disp: 90 tablet, Rfl: 1 .  naloxone  (NARCAN ) nasal spray 4 mg/0.1 mL, Inject intranasal for overdose (Patient taking differently: Inject intranasal for overdose), Disp: 1 each, Rfl: 1 .  pantoprazole  (PROTONIX ) 40 MG tablet, TAKE 1 TABLET(40 MG) BY MOUTH DAILY 30 TO 60 MINUTES BEFORE FIRST MEAL OF THE DAY, Disp: 90 tablet, Rfl: 2 .  spironolactone  (ALDACTONE ) 25 MG tablet, Take 0.5 tablets (12.5 mg total) by mouth daily., Disp: 45 tablet, Rfl: 3 .  traZODone  (DESYREL ) 50 MG tablet, Take 1-2 tablets (50-100 mg total) by mouth at bedtime as needed for sleep., Disp: 60 tablet, Rfl: 0 .  valsartan  (DIOVAN ) 40 MG tablet, Take 1 tablet (40 mg total) by mouth daily., Disp: 90 tablet, Rfl: 1 .  Accu-Chek Softclix Lancets lancets, Use as instructed. Check Blood sugars daily before breakfast, Disp: 100 each, Rfl: 12 .  Blood Glucose Monitoring Suppl (ACCU-CHEK GUIDE) w/Device KIT, Check Blood sugars daily before breakfast, Disp: 1 kit, Rfl: 0 .  budesonide  (PULMICORT ) 0.25 MG/2ML nebulizer solution, One vial twice daily with albuterol  in nebulizer, Disp: 120 mL, Rfl: 12 .  fluticasone  furoate-vilanterol (BREO ELLIPTA ) 100-25 MCG/ACT AEPB, Inhale 1 puff into the lungs daily. (Patient not taking: Reported on 07/31/2024), Disp: 1 each, Rfl: 11 .  glucose blood (ACCU-CHEK GUIDE TEST) test strip, Use as instructed. Check Blood sugars daily before breakfast, Disp: 100 each, Rfl: 12 .  QUEtiapine  (SEROQUEL ) 50 MG tablet, Take 1 tablet (50 mg total) by mouth at bedtime. (Patient not taking: Reported on 07/31/2024), Disp: 90 tablet, Rfl: 1 No  Known Allergies   Social History   Socioeconomic History  . Marital status: Divorced    Spouse name: Not on file  . Number of children: Not on file  . Years of education: Not on file  . Highest education level: Some college, no degree  Occupational History  . Not on file  Tobacco Use  . Smoking status: Former    Current packs/day: 0.00    Average packs/day: 1 pack/day for 35.0 years (35.0 ttl pk-yrs)    Types: Cigarettes    Start date: 08/25/1984    Quit date: 08/26/2019    Years since quitting: 4.9  . Smokeless tobacco: Never  Vaping Use  . Vaping status: Former  Substance and Sexual Activity  . Alcohol use: No    Alcohol/week: 0.0 standard drinks of alcohol  . Drug use: Not Currently    Frequency: 4.0 times per week    Types: Crack cocaine     Comment: RELAPSE 12/2016  . Sexual activity: Not on file  Other Topics Concern  . Not on file  Social History Narrative   Lives with son in an apartment on the third floor.  Does not work.  On disability.  Previously worked in [employer].    Education:  high school.     Social Drivers of Health   Financial Resource Strain: Low Risk  (05/06/2024)   Overall Financial Resource Strain (CARDIA)   . Difficulty of Paying Living Expenses: Not very hard  Food Insecurity: No Food Insecurity (07/12/2024)   Hunger Vital Sign   . Worried About Programme researcher, broadcasting/film/video in the Last Year: Never true   . Ran Out of Food in the Last Year: Never true  Transportation Needs: Unmet Transportation Needs (06/10/2024)   PRAPARE - Transportation   . Lack of Transportation (Medical): Yes   . Lack of Transportation (Non-Medical): Yes  Physical Activity: Unknown (10/18/2023)   Exercise Vital Sign   . Days of Exercise per Week: 2 days   . Minutes of Exercise per Session: Patient declined  Recent Concern: Physical Activity - Inactive (08/02/2023)   Exercise Vital Sign   . Days of Exercise per Week: 1 day   . Minutes of Exercise per Session: 0 min  Stress:  Stress Concern Present (05/06/2024)   [employer] of Occupational Health - Occupational Stress Questionnaire   . Feeling of Stress: To some extent  Social Connections: Moderately Integrated (04/05/2024)   Social Connection and Isolation Panel   . Frequency of Communication with Friends and Family: More than three times a week   . Frequency of Social Gatherings with Friends and Family: More than three times a week   . Attends Religious Services: More than 4 times per year   . Active Member of Clubs or Organizations: Yes   . Attends Banker Meetings: More than 4 times per year   . Marital Status: Never married  Intimate Partner Violence: Not At Risk (04/05/2024)   Humiliation, Afraid, Rape, and Kick questionnaire   . Fear of Current or Ex-Partner: No   . Emotionally Abused: No   . Physically Abused: No   . Sexually Abused: No    Physical Exam      Future Appointments  Date Time Provider Department Center  08/13/2024 11:00 AM Fleeta Morris, Garnette CROME, RPH-CPP [facility] [address]  08/27/2024  1:40 PM [facility] VISIT [facility] [place] [facility]  09/05/2024 11:00 AM MC ECHO OP 1 [facility]  09/05/2024 12:00 PM Bensimhon, Toribio SAUNDERS, MD [facility] None  09/05/2024  2:00 PM El-Khouri, Evalene MATSU, RD [facility] NDM  09/17/2024 11:30 AM Darlean Ozell NOVAK, MD [facility] [address]  10/08/2024  2:15 PM Gaynel Delon CROME, DPM [facility]  10/11/2024  8:30 AM Celestia [name] SQUIBB, NP [facility] [place] [facility]

## 2024-08-02 DIAGNOSIS — M4696 Unspecified inflammatory spondylopathy, lumbar region: Secondary | ICD-10-CM | POA: Diagnosis not present

## 2024-08-02 DIAGNOSIS — E119 Type 2 diabetes mellitus without complications: Secondary | ICD-10-CM | POA: Diagnosis not present

## 2024-08-02 DIAGNOSIS — J449 Chronic obstructive pulmonary disease, unspecified: Secondary | ICD-10-CM | POA: Diagnosis not present

## 2024-08-02 DIAGNOSIS — M1990 Unspecified osteoarthritis, unspecified site: Secondary | ICD-10-CM | POA: Diagnosis not present

## 2024-08-02 DIAGNOSIS — Z79899 Other long term (current) drug therapy: Secondary | ICD-10-CM | POA: Diagnosis not present

## 2024-08-02 DIAGNOSIS — M1A09X0 Idiopathic chronic gout, multiple sites, without tophus (tophi): Secondary | ICD-10-CM | POA: Diagnosis not present

## 2024-08-07 ENCOUNTER — Other Ambulatory Visit (HOSPITAL_COMMUNITY): Payer: Self-pay | Admitting: Emergency Medicine

## 2024-08-07 ENCOUNTER — Other Ambulatory Visit: Payer: Self-pay | Admitting: Pharmacist

## 2024-08-07 DIAGNOSIS — E7841 Elevated Lipoprotein(a): Secondary | ICD-10-CM

## 2024-08-07 MED ORDER — METFORMIN HCL ER 500 MG PO TB24: 500.0000 mg | ORAL_TABLET | Freq: Every day | ORAL | 1 refills | Status: AC

## 2024-08-07 MED ORDER — ATORVASTATIN CALCIUM 40 MG PO TABS: 40.0000 mg | ORAL_TABLET | Freq: Every day | ORAL | 1 refills | Status: AC

## 2024-08-07 NOTE — Progress Notes (Signed)
 Paramedicine Encounter    Patient ID: Stacey Lin, female    DOB: 1965-11-04, 58 y.o.   MRN: [id number]   Complaints NONE  Assessment A&O x 4, skin W&D w/ good color.  Denies chest pain or SOB.  Lung sounds clear bilat.  Compliance with meds YES  Pill box filled x 2 weeks  Refills needed  Furosemide , Spironolactone   Social changes NONE   LMP  (LMP Unknown)  Weight yesterday-not taken Last visit weight-175.8  ACTION: Home visit completed  [name] [phone] 08/07/24  Patient Care Team: Celestia [name] SQUIBB, NP as PCP - General (Internal Medicine)  Patient Active Problem List   Diagnosis Date Noted   Low blood pressure, not hypotension (required pausing some of her GDMT) 04/18/2024   Pruritic intertrigo 04/18/2024   Hot flashes due to menopause 04/18/2024   Constipation 04/18/2024   Acute respiratory distress 04/05/2024   Lumbar radiculopathy 03/13/2024   CHF (congestive heart failure) (HCC) 02/29/2024   COPD with acute exacerbation (HCC) 02/28/2024   Respiratory failure with hypoxia (HCC) 11/21/2023   COPD exacerbation (HCC) 07/25/2023   Nonrheumatic aortic valve insufficiency    Elevated LFTs 04/08/2022   Acute metabolic encephalopathy 04/08/2022   Depressed mood 04/08/2022   Headache 04/07/2022   Obesity (BMI 30-39.9) 04/07/2022   Acute exacerbation of CHF (congestive heart failure) (HCC) 04/06/2022   Polysubstance abuse (HCC) 04/06/2022   Elevated troponin 04/06/2022   Controlled type 2 diabetes mellitus with hyperglycemia (HCC) 04/06/2022   Transaminitis 04/06/2022   Hypoalbuminemia 04/06/2022   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/11/2021   Upper airway cough syndrome 12/20/2018   Cigarette smoker 11/21/2018   Asthmatic bronchitis, moderate persistent, uncomplicated 11/20/2018   DOE (dyspnea on exertion) 04/14/2016   Streptococcus pneumoniae pneumonia 04/07/2016   Acute respiratory failure with hypoxia (HCC) 04/03/2016    Elevated lactic acid level 04/03/2016   Leukocytosis 04/03/2016   New onset type 2 diabetes mellitus (HCC) 04/03/2016   Abnormal urinalysis 04/03/2016   Trichomonas infection 04/03/2016   Non compliance w medication regimen 01/12/2016   COPD without exacerbation (HCC) 12/04/2014   AKI (acute kidney injury) 03/14/2014   Acute on chronic systolic heart failure (HCC) 03/14/2014   Musculoskeletal chest pain 03/14/2014   Dyspnea 12/03/2013   Periodic health assessment, general screening, adult 10/03/2013   Mitral valve regurgitation 08/22/2013   FIBROIDS, UTERUS 05/11/2010   ANEMIA, SECONDARY TO BLOOD LOSS 05/11/2010   Asthmatic bronchitis 05/11/2010   TOBACCO USER 07/07/2009   Essential hypertension, benign 06/19/2009   MENORRHAGIA 06/19/2009   COCAINE  ABUSE, HX OF 06/19/2009    Current Outpatient Medications:    Accu-Chek Softclix Lancets lancets, Use as instructed. Check Blood sugars daily before breakfast, Disp: 100 each, Rfl: 12   albuterol  (VENTOLIN  HFA) 108 (90 Base) MCG/ACT inhaler, Inhale 2 puffs into the lungs every 4 (four) hours as needed for wheezing or shortness of breath., Disp: 17 g, Rfl: 1   allopurinol  (ZYLOPRIM ) 300 MG tablet, Take 300 mg by mouth 2 (two) times daily., Disp: , Rfl:    ammonium lactate  (LAC-HYDRIN ) 12 % lotion, Apply 1 Application topically as needed for dry skin., Disp: 400 g, Rfl: 5   atorvastatin  (LIPITOR) 40 MG tablet, Take 1 tablet (40 mg total) by mouth daily., Disp: 90 tablet, Rfl: 1   Blood Glucose Monitoring Suppl (ACCU-CHEK GUIDE) w/Device KIT, Check Blood sugars daily before breakfast, Disp: 1 kit, Rfl: 0   budesonide  (PULMICORT ) 0.25 MG/2ML nebulizer solution, One vial twice daily with albuterol  in nebulizer,  Disp: 120 mL, Rfl: 12   carvedilol  (COREG ) 3.125 MG tablet, Take 1 tablet (3.125 mg total) by mouth 2 (two) times daily with a meal., Disp: 60 tablet, Rfl: 5   dapagliflozin  propanediol (FARXIGA ) 10 MG TABS tablet, Take 1 tablet (10 mg  total) by mouth daily., Disp: 90 tablet, Rfl: 1   famotidine  (PEPCID ) 20 MG tablet, One after supper, Disp: 90 tablet, Rfl: 1   fluticasone  furoate-vilanterol (BREO ELLIPTA ) 100-25 MCG/ACT AEPB, Inhale 1 puff into the lungs daily. (Patient not taking: Reported on 07/31/2024), Disp: 1 each, Rfl: 11   furosemide  (LASIX ) 40 MG tablet, Take 1 tablet (40 mg total) by mouth daily., Disp: 30 tablet, Rfl: 3   gabapentin  (NEURONTIN ) 300 MG capsule, Take 1 capsule (300 mg total) by mouth 2 (two) times daily., Disp: 60 capsule, Rfl: 6   glucose blood (ACCU-CHEK GUIDE TEST) test strip, Use as instructed. Check Blood sugars daily before breakfast, Disp: 100 each, Rfl: 12   ipratropium-albuterol  (DUONEB) 0.5-2.5 (3) MG/3ML SOLN, Take 3 mLs by nebulization 2 (two) times daily., Disp: 180 mL, Rfl: 1   metFORMIN  (GLUCOPHAGE -XR) 500 MG 24 hr tablet, Take 1 tablet (500 mg total) by mouth daily., Disp: 90 tablet, Rfl: 1   naloxone  (NARCAN ) nasal spray 4 mg/0.1 mL, Inject intranasal for overdose (Patient taking differently: Inject intranasal for overdose), Disp: 1 each, Rfl: 1   pantoprazole  (PROTONIX ) 40 MG tablet, TAKE 1 TABLET(40 MG) BY MOUTH DAILY 30 TO 60 MINUTES BEFORE FIRST MEAL OF THE DAY, Disp: 90 tablet, Rfl: 2   QUEtiapine  (SEROQUEL ) 50 MG tablet, Take 1 tablet (50 mg total) by mouth at bedtime. (Patient not taking: Reported on 07/31/2024), Disp: 90 tablet, Rfl: 1   spironolactone  (ALDACTONE ) 25 MG tablet, Take 0.5 tablets (12.5 mg total) by mouth daily., Disp: 45 tablet, Rfl: 3   traZODone  (DESYREL ) 50 MG tablet, Take 1-2 tablets (50-100 mg total) by mouth at bedtime as needed for sleep., Disp: 60 tablet, Rfl: 0   valsartan  (DIOVAN ) 40 MG tablet, Take 1 tablet (40 mg total) by mouth daily., Disp: 90 tablet, Rfl: 1 No Known Allergies   Social History   Socioeconomic History   Marital status: Divorced    Spouse name: Not on file   Number of children: Not on file   Years of education: Not on file   Highest  education level: Some college, no degree  Occupational History   Not on file  Tobacco Use   Smoking status: Former    Current packs/day: 0.00    Average packs/day: 1 pack/day for 35.0 years (35.0 ttl pk-yrs)    Types: Cigarettes    Start date: 08/25/1984    Quit date: 08/26/2019    Years since quitting: 4.9   Smokeless tobacco: Never  Vaping Use   Vaping status: Former  Substance and Sexual Activity   Alcohol use: No    Alcohol/week: 0.0 standard drinks of alcohol   Drug use: Not Currently    Frequency: 4.0 times per week    Types: Crack cocaine     Comment: RELAPSE 12/2016   Sexual activity: Not on file  Other Topics Concern   Not on file  Social History Narrative   Lives with son in an apartment on the third floor.  Does not work.  On disability.  Previously worked in [employer].    Education: high school.     Social Drivers of Health   Financial Resource Strain: Low Risk  (05/06/2024)   Overall Financial  Resource Strain (CARDIA)    Difficulty of Paying Living Expenses: Not very hard  Food Insecurity: No Food Insecurity (07/12/2024)   Hunger Vital Sign    Worried About Running Out of Food in the Last Year: Never true    Ran Out of Food in the Last Year: Never true  Transportation Needs: Unmet Transportation Needs (06/10/2024)   PRAPARE - Administrator, Civil Service (Medical): Yes    Lack of Transportation (Non-Medical): Yes  Physical Activity: Unknown (10/18/2023)   Exercise Vital Sign    Days of Exercise per Week: 2 days    Minutes of Exercise per Session: Patient declined  Recent Concern: Physical Activity - Inactive (08/02/2023)   Exercise Vital Sign    Days of Exercise per Week: 1 day    Minutes of Exercise per Session: 0 min  Stress: Stress Concern Present (05/06/2024)   [employer] of Occupational Health - Occupational Stress Questionnaire    Feeling of Stress: To some extent  Social Connections: Moderately Integrated (04/05/2024)   Social  Connection and Isolation Panel    Frequency of Communication with Friends and Family: More than three times a week    Frequency of Social Gatherings with Friends and Family: More than three times a week    Attends Religious Services: More than 4 times per year    Active Member of Clubs or Organizations: Yes    Attends Banker Meetings: More than 4 times per year    Marital Status: Never married  Intimate Partner Violence: Not At Risk (04/05/2024)   Humiliation, Afraid, Rape, and Kick questionnaire    Fear of Current or Ex-Partner: No    Emotionally Abused: No    Physically Abused: No    Sexually Abused: No    Physical Exam      Future Appointments  Date Time Provider Department Center  08/13/2024 11:00 AM Fleeta Tonia Garnette LITTIE, RPH-CPP [facility] [address]  08/27/2024  1:40 PM [facility]NUAL WELLNESS VISIT [facility] [place]  09/05/2024 11:00 AM MC ECHO OP 1 [facility]  09/05/2024 12:00 PM Bensimhon, Toribio SAUNDERS, MD [facility] None  09/05/2024  2:00 PM El-Khouri, Evalene MATSU, RD [facility] NDM  09/17/2024 11:30 AM Darlean Ozell NOVAK, MD [facility] [address]  10/08/2024  2:15 PM Gaynel Delon LITTIE, DPM [facility]  10/11/2024  8:30 AM Celestia [name] SQUIBB, NP [facility] [place]

## 2024-08-07 NOTE — Progress Notes (Signed)
 Pharmacy Quality Measure Review  This patient is appearing on a report for the adherence measure for cholesterol (statin) and diabetes medications this calendar year. All refills appear to be up to date.  Medication: atorvastatin  Last fill date: 08/03/2024 for 30 day supply  Medication: dapagliflozin   Last fill date: 07/22/2024 for 30 day supply  Medication: metformin  Last fill date: 08/03/2024 for 30 day supply  Medication: valsartan  Last fill date: 08/03/2024 for 90 day supply.   Refills are up to date. Reminder set for next fills. Of note, I did sent new rxns for atorvastatin  and metformin  for her to fill when needed.   Herlene Fleeta Morris, PharmD, JAQUELINE, CPP Clinical Pharmacist [facility] [phone]

## 2024-08-07 NOTE — Progress Notes (Signed)
 Stacey Lin                                          MRN: [id number]   08/07/2024   The [facility] Quality Team Specialist reviewed this patient medical record for the purposes of chart review for care gap closure. The following were reviewed: abstraction for care gap closure-glycemic status assessment.    [facility] Quality Team

## 2024-08-09 ENCOUNTER — Telehealth: Payer: Self-pay | Admitting: Licensed Clinical Social Worker

## 2024-08-09 ENCOUNTER — Other Ambulatory Visit (HOSPITAL_COMMUNITY): Payer: Self-pay | Admitting: Emergency Medicine

## 2024-08-09 ENCOUNTER — Telehealth (HOSPITAL_COMMUNITY): Payer: Self-pay | Admitting: Emergency Medicine

## 2024-08-09 ENCOUNTER — Telehealth (HOSPITAL_COMMUNITY): Payer: Self-pay | Admitting: Licensed Clinical Social Worker

## 2024-08-09 ENCOUNTER — Other Ambulatory Visit (HOSPITAL_COMMUNITY): Payer: Self-pay

## 2024-08-09 DIAGNOSIS — I5022 Chronic systolic (congestive) heart failure: Secondary | ICD-10-CM

## 2024-08-09 MED ORDER — SPIRONOLACTONE 25 MG PO TABS: 25.0000 mg | ORAL_TABLET | Freq: Every day | ORAL | 3 refills | Status: DC

## 2024-08-09 NOTE — Telephone Encounter (Signed)
 H&V Care Navigation CSW Progress Note  CSW informed pt had reported need for bus passes to get to appts as well as reporting print out of appts would be helpful.  CSW providing requesting passes and appt information to [employer] who will take out to pt at next visit.  Will continue to follow and assist as needed  Andriette HILARIO Leech, LCSW Clinical Social Worker [facility] Desk#: [phone] Cell#: [phone]

## 2024-08-09 NOTE — Telephone Encounter (Signed)
 H&V Care Navigation CSW Progress Note  Clinical Social Worker received a call from pt to inquire why she had a change in her SNAP benefits. LCSW shared that unfortunately our team at [facility] does not have access to see changes in things such as SNAP from the hospital. She would need to reach out to DSS directly, she recently completed re-certification and given she is now living with her son/daughter in law and spending less for housing etc she may receive less. Again, cautioned I could not give her certainty for why her benefits changed, just that she would need to call DSS and be aware that there have been many ongoing state/federal delays and changes in benefits.  Pt also asks for bus passes to get to appts, encouraged her to use her Dual Complete transportation as able to practice as there may be appts we aren't able to provide bus passes to. Pt expressed hardship with knowing when her appts are/remembering to call beforehand. I offered to call Jenna, LCSW and see if she could print appts and provide bus passes via paramedic- information passed on and resources provided.   Patient is participating in a Managed Medicaid Plan:  No, UHC Dual Complete  SDOH Screenings   Food Insecurity: Food Insecurity Present (08/09/2024)  Housing: High Risk (08/09/2024)  Transportation Needs: Unmet Transportation Needs (08/09/2024)  Utilities: Not At Risk (05/06/2024)  Recent Concern: Utilities - At Risk (02/28/2024)  Depression (PHQ2-9): High Risk (07/12/2024)  Financial Resource Strain: Low Risk  (08/09/2024)  Physical Activity: Insufficiently Active (08/09/2024)  Social Connections: Moderately Isolated (08/09/2024)  Stress: Stress Concern Present (08/09/2024)  Tobacco Use: Medium Risk (07/24/2024)  Health Literacy: Adequate Health Literacy (05/06/2024)    Marit Lark, MSW, LCSW Clinical Social Worker II [facility] Care Navigation  [phone]- work cell phone (preferred)

## 2024-08-09 NOTE — Progress Notes (Signed)
 Med rec only for med change.  Increased Spiro to 25mg  daily after 5lb weight gain.  Also provided pt w/ bus passes and print out of pending apppointments from Jenna Uris.    Mary Sharps, EMT-Paramedic [phone] 08/09/2024

## 2024-08-09 NOTE — Telephone Encounter (Signed)
 Called Stacey Lin to check on her weight.  She is 180lbs this morning.  She denies chest pain or SOB but this makes 5lb weight gain in 1 week.  Will reach out to H&V triage for orders.    Mary Sharps, EMT-Paramedic [phone] 08/09/2024

## 2024-08-13 ENCOUNTER — Ambulatory Visit: Attending: Primary Care | Admitting: Pharmacist

## 2024-08-13 ENCOUNTER — Other Ambulatory Visit: Payer: Self-pay

## 2024-08-13 ENCOUNTER — Telehealth: Payer: Self-pay | Admitting: Pharmacist

## 2024-08-13 ENCOUNTER — Encounter: Payer: Self-pay | Admitting: Pharmacist

## 2024-08-13 DIAGNOSIS — Z7984 Long term (current) use of oral hypoglycemic drugs: Secondary | ICD-10-CM

## 2024-08-13 DIAGNOSIS — I10 Essential (primary) hypertension: Secondary | ICD-10-CM

## 2024-08-13 DIAGNOSIS — G4709 Other insomnia: Secondary | ICD-10-CM

## 2024-08-13 DIAGNOSIS — E1165 Type 2 diabetes mellitus with hyperglycemia: Secondary | ICD-10-CM | POA: Diagnosis not present

## 2024-08-13 MED ORDER — OZEMPIC (0.25 OR 0.5 MG/DOSE) 2 MG/3ML ~~LOC~~ SOPN
0.2500 mg | PEN_INJECTOR | SUBCUTANEOUS | 1 refills | Status: DC
  Filled 2024-08-13 (×2): qty 3, 28d supply, fill #0

## 2024-08-13 NOTE — Progress Notes (Signed)
 S:     No chief complaint on file.  58 y.o. female who presents for diabetes evaluation, education, and management. Patient arrives in good spirits and presents without any assistance.    Patient was referred and last seen by Primary Care Provider, [name] Bohr, on 06/06/2024. A1c was last taken 07/12/24 and shows good glycemic control at 6.3%.   PMH is significant for T2DM, HTN, mitral valve regurgitation, non-obstructive cardiomyopathy/HFrEF (latest EF was 30-35% on Echo 02/29/24), COPD w/ hx of exacerbation (last hospitalization in 03/2024), lumbar radiculopathy, former tobacco use, insomnia.    Patient reports Diabetes is longstanding. A1c last month showed good glycemic control at 6.3%. She takes metformin  500 mg XR daily and Farxiga  10 mg daily. Has never been on GLP or GLP/GIP therapy. No known hx of pancreatitis or thyroid  cancer.   Family/Social History:  -Fhx: HTN, allergies, asthma, heart disease -Tobacco: former smoker (quit in 2020) -Alcohol: none reported  Current diabetes medications include: Farxiga  10 mg daily, metformin  500 mg XR daily Current hypertension medications include: carvedilol  3.125 mg BID, spironolactone  25 mg daily, valsartan  40 mg daily **also takes Lasix  40 mg daily for fluid control Current hyperlipidemia medications include: atorvastatin  40 mg daily   Patient reports adherence to taking all medications as prescribed.   Insurance coverage: [employer]  Patient denies hypoglycemic events.  Reported home fasting blood sugars: none reported   Reported 2 hour post-meal/random blood sugars: none reported.  Patient reports nocturia (nighttime urination).  Patient reports neuropathy (nerve pain). Patient denies visual changes. Patient reports self foot exams.   Patient reported dietary habits:  -Admits to snacking throughout the day and at bedtime   Patient-reported exercise habits:  -Limited by dyspnea, CHF hx  O:   Lab Results   Component Value Date   HGBA1C 6.3 07/12/2024   There were no vitals filed for this visit.  Lipid Panel     Component Value Date/Time   CHOL 146 07/12/2024 1036   TRIG 62 07/12/2024 1036   HDL 77 07/12/2024 1036   CHOLHDL 1.9 07/12/2024 1036   CHOLHDL 2.5 07/26/2023 0254   VLDL 9 07/26/2023 0254   LDLCALC 56 07/12/2024 1036    Clinical Atherosclerotic Cardiovascular Disease (ASCVD): No  The 10-year ASCVD risk score (Arnett DK, et al., 2019) is: 5.1%   Values used to calculate the score:     Age: 88 years     Clincally relevant sex: Female     Is Non-Hispanic African American: Yes     Diabetic: Yes     Tobacco smoker: No     Systolic Blood Pressure: 102 mmHg     Is BP treated: Yes     HDL Cholesterol: 77 mg/dL     Total Cholesterol: 146 mg/dL   Patient is participating in a Managed Medicaid Plan: No   A/P: Diabetes longstanding currently controlled with A1c at goal. Patient is is not symptomatic from a hypoglycemia standpoint but is able to verbalize appropriate hypoglycemia management plan. She does have some polyuria and longstanding neuropathy symptoms. However, her polyuria is also due to fluid intake and loop diuretic use. Additionally, she has no changes to her baseline neuropathy symptoms. Medication adherence appears to be appropriate. We discussed the addition of a GLP for symptom improvement as it relates to her CHF hx. Additionally, it would offer CV morbidity and mortality benefit. She is amenable to starting this and we will pursue coverage at this time.  -Continued metformin  and Farxiga  for  now. May remove metformin  and continue with Farxiga  + Ozempic in the future should Ozempic be approved.  -Start Ozempic 0.25 mg weekly. Pursuing coverage with a PA. Will contact patient once approved.  -Extensively discussed pathophysiology of diabetes, recommended lifestyle interventions, dietary effects on blood sugar control.  -Counseled on s/sx of and management of  hypoglycemia.  -Next A1c anticipated 09/2024.  -Urine microalbumin to creatinine ratio  ASCVD risk - primary prevention in patient with diabetes. Last LDL is at goal. high intensity statin indicated.  -Continued atorvastatin  40 mg.   Hypertension longstanding currently controlled with last OV BP at goal and several home goal readings via paramedicine. Blood pressure goal of <130/80 mmHg. Medication adherence reported. -Continued current regimen.  Written patient instructions provided. Patient verbalized understanding of treatment plan.  Total time in face to face counseling 45 minutes.    Follow-up:  Pharmacist in 1 month.  Herlene Fleeta Morris, PharmD, JAQUELINE, CPP Clinical Pharmacist [facility] [phone]

## 2024-08-13 NOTE — Telephone Encounter (Signed)
 Hey friend,   A1c has been controlled for a long time. It is under 6.5 currently but she does have a hx of T2DM. For example, her A1c in 01/2021 was 7.0.   I am pursuing Ozempic for additional weight loss benefit and cardiovascular benefit given her heart failure.   She has U[employer] and Medicaid. Can we try to get a PA approved?

## 2024-08-14 ENCOUNTER — Telehealth: Payer: Self-pay

## 2024-08-14 ENCOUNTER — Other Ambulatory Visit: Payer: Self-pay | Admitting: Pharmacist

## 2024-08-14 ENCOUNTER — Other Ambulatory Visit: Payer: Self-pay

## 2024-08-14 ENCOUNTER — Other Ambulatory Visit (HOSPITAL_COMMUNITY): Payer: Self-pay | Admitting: Emergency Medicine

## 2024-08-14 ENCOUNTER — Other Ambulatory Visit (HOSPITAL_COMMUNITY): Payer: Self-pay

## 2024-08-14 LAB — MICROALBUMIN / CREATININE URINE RATIO
Creatinine, Urine: 6.3 mg/dL
Microalbumin, Urine: <3 ug/mL

## 2024-08-14 NOTE — Progress Notes (Addendum)
 Paramedicine Encounter    Patient ID: Stacey Lin, female    DOB: [birth year], 58 y.o.   MRN: [id number]   Complaints NONE  Assessment A&O x 4, skin W&D w/ good color.  Denies chest pain or SOB.  Lung sounds clear and equal bilat and no peripheral edema noted.  Compliance with meds No - missed 2 a.m. and p.m. doses  Pill box filled x 2 weeks   Refills needed Farxiga . ( Also set up all meds on auto refill)  Meds changes since last visit NONE    Social changes NONE    Weight yesterday-  Last visit weight-179lb   Stacey Lin was meeting with Biomedical engineer on my arrival.  She reports to be feeling well.  Denies chest pain or SOB.  Pt is starting on Ozempic today.  I did basic education on how to use the prefilled pen and administer the medicine.  Instructed her to administer this once a week as prescribed.  Also reviewed Glucometer pen with her as she has not been checking her sugar on a daily basis.  Expressed the importance of keeping a daily check on her CBG especially since she is starting the Ozempic. Med box reconciled x 2 weeks.  I did pick up pt's refill of Spironolactone  from [facility] in order to facilitate reconciling med box.  ACTION: Home visit completed  [name] [phone] 08/14/24  Patient Care Team: Celestia [name] SQUIBB, NP as PCP - General (Internal Medicine)  Patient Active Problem List   Diagnosis Date Noted   Low blood pressure, not hypotension (required pausing some of her GDMT) 04/18/2024   Pruritic intertrigo 04/18/2024   Hot flashes due to menopause 04/18/2024   Constipation 04/18/2024   Acute respiratory distress 04/05/2024   Lumbar radiculopathy 03/13/2024   CHF (congestive heart failure) (HCC) 02/29/2024   COPD with acute exacerbation (HCC) 02/28/2024   Respiratory failure with hypoxia (HCC) 11/21/2023   COPD exacerbation (HCC) 07/25/2023   Nonrheumatic aortic valve insufficiency    Elevated LFTs 04/08/2022   Acute  metabolic encephalopathy 04/08/2022   Depressed mood 04/08/2022   Headache 04/07/2022   Obesity (BMI 30-39.9) 04/07/2022   Acute exacerbation of CHF (congestive heart failure) (HCC) 04/06/2022   Polysubstance abuse (HCC) 04/06/2022   Elevated troponin 04/06/2022   Controlled type 2 diabetes mellitus with hyperglycemia (HCC) 04/06/2022   Transaminitis 04/06/2022   Hypoalbuminemia 04/06/2022   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/11/2021   Upper airway cough syndrome 12/20/2018   Cigarette smoker 11/21/2018   Asthmatic bronchitis, moderate persistent, uncomplicated 11/20/2018   DOE (dyspnea on exertion) 04/14/2016   Streptococcus pneumoniae pneumonia 04/07/2016   Acute respiratory failure with hypoxia (HCC) 04/03/2016   Elevated lactic acid level 04/03/2016   Leukocytosis 04/03/2016   New onset type 2 diabetes mellitus (HCC) 04/03/2016   Abnormal urinalysis 04/03/2016   Trichomonas infection 04/03/2016   Non compliance w medication regimen 01/12/2016   COPD without exacerbation (HCC) 12/04/2014   AKI (acute kidney injury) 03/14/2014   Acute on chronic systolic heart failure (HCC) 03/14/2014   Musculoskeletal chest pain 03/14/2014   Dyspnea 12/03/2013   Periodic health assessment, general screening, adult 10/03/2013   Mitral valve regurgitation 08/22/2013   FIBROIDS, UTERUS 05/11/2010   ANEMIA, SECONDARY TO BLOOD LOSS 05/11/2010   Asthmatic bronchitis 05/11/2010   TOBACCO USER 07/07/2009   Essential hypertension, benign 06/19/2009   MENORRHAGIA 06/19/2009   COCAINE  ABUSE, HX OF 06/19/2009    Current Outpatient Medications:    Accu-Chek Softclix Lancets  lancets, Use as instructed. Check Blood sugars daily before breakfast, Disp: 100 each, Rfl: 12   albuterol  (VENTOLIN  HFA) 108 (90 Base) MCG/ACT inhaler, Inhale 2 puffs into the lungs every 4 (four) hours as needed for wheezing or shortness of breath., Disp: 17 g, Rfl: 1   allopurinol  (ZYLOPRIM ) 300 MG tablet, Take 300 mg by  mouth 2 (two) times daily., Disp: , Rfl:    ammonium lactate  (LAC-HYDRIN ) 12 % lotion, Apply 1 Application topically as needed for dry skin., Disp: 400 g, Rfl: 5   atorvastatin  (LIPITOR) 40 MG tablet, Take 1 tablet (40 mg total) by mouth daily., Disp: 90 tablet, Rfl: 1   Blood Glucose Monitoring Suppl (ACCU-CHEK GUIDE) w/Device KIT, Check Blood sugars daily before breakfast, Disp: 1 kit, Rfl: 0   budesonide  (PULMICORT ) 0.25 MG/2ML nebulizer solution, One vial twice daily with albuterol  in nebulizer, Disp: 120 mL, Rfl: 12   carvedilol  (COREG ) 3.125 MG tablet, Take 1 tablet (3.125 mg total) by mouth 2 (two) times daily with a meal., Disp: 60 tablet, Rfl: 5   dapagliflozin  propanediol (FARXIGA ) 10 MG TABS tablet, Take 1 tablet (10 mg total) by mouth daily., Disp: 90 tablet, Rfl: 1   famotidine  (PEPCID ) 20 MG tablet, One after supper, Disp: 90 tablet, Rfl: 1   fluticasone  furoate-vilanterol (BREO ELLIPTA ) 100-25 MCG/ACT AEPB, Inhale 1 puff into the lungs daily. (Patient not taking: Reported on 07/31/2024), Disp: 1 each, Rfl: 11   furosemide  (LASIX ) 40 MG tablet, Take 1 tablet (40 mg total) by mouth daily., Disp: 30 tablet, Rfl: 3   gabapentin  (NEURONTIN ) 300 MG capsule, Take 1 capsule (300 mg total) by mouth 2 (two) times daily., Disp: 60 capsule, Rfl: 6   glucose blood (ACCU-CHEK GUIDE TEST) test strip, Use as instructed. Check Blood sugars daily before breakfast, Disp: 100 each, Rfl: 12   ipratropium-albuterol  (DUONEB) 0.5-2.5 (3) MG/3ML SOLN, Take 3 mLs by nebulization 2 (two) times daily., Disp: 180 mL, Rfl: 1   metFORMIN  (GLUCOPHAGE -XR) 500 MG 24 hr tablet, Take 1 tablet (500 mg total) by mouth daily., Disp: 90 tablet, Rfl: 1   naloxone  (NARCAN ) nasal spray 4 mg/0.1 mL, Inject intranasal for overdose (Patient taking differently: Inject intranasal for overdose), Disp: 1 each, Rfl: 1   pantoprazole  (PROTONIX ) 40 MG tablet, TAKE 1 TABLET(40 MG) BY MOUTH DAILY 30 TO 60 MINUTES BEFORE FIRST MEAL OF THE  DAY, Disp: 90 tablet, Rfl: 2   QUEtiapine  (SEROQUEL ) 50 MG tablet, Take 1 tablet (50 mg total) by mouth at bedtime. (Patient not taking: Reported on 07/31/2024), Disp: 90 tablet, Rfl: 1   Semaglutide,0.25 or 0.5MG /DOS, (OZEMPIC, 0.25 OR 0.5 MG/DOSE,) 2 MG/3ML SOPN, Inject 0.25 mg into the skin once a week., Disp: 3 mL, Rfl: 1   spironolactone  (ALDACTONE ) 25 MG tablet, Take 1 tablet (25 mg total) by mouth daily., Disp: 45 tablet, Rfl: 3   traZODone  (DESYREL ) 50 MG tablet, Take 1-2 tablets (50-100 mg total) by mouth at bedtime as needed for sleep., Disp: 60 tablet, Rfl: 0   valsartan  (DIOVAN ) 40 MG tablet, Take 1 tablet (40 mg total) by mouth daily., Disp: 90 tablet, Rfl: 1 No Known Allergies   Social History   Socioeconomic History   Marital status: Divorced    Spouse name: Not on file   Number of children: Not on file   Years of education: Not on file   Highest education level: Some college, no degree  Occupational History   Not on file  Tobacco Use   Smoking  status: Former    Current packs/day: 0.00    Average packs/day: 1 pack/day for 35.0 years (35.0 ttl pk-yrs)    Types: Cigarettes    Start date: 08/25/1984    Quit date: 08/26/2019    Years since quitting: 4.9   Smokeless tobacco: Never  Vaping Use   Vaping status: Former  Substance and Sexual Activity   Alcohol use: No    Alcohol/week: 0.0 standard drinks of alcohol   Drug use: Not Currently    Frequency: 4.0 times per week    Types: Crack cocaine     Comment: RELAPSE 12/2016   Sexual activity: Not on file  Other Topics Concern   Not on file  Social History Narrative   Lives with son in an apartment on the third floor.  Does not work.  On disability.  Previously worked in [employer].    Education: high school.     Social Drivers of Corporate investment banker Strain: Low Risk  (08/09/2024)   Overall Financial Resource Strain (CARDIA)    Difficulty of Paying Living Expenses: Not very hard  Food Insecurity: Food  Insecurity Present (08/09/2024)   Hunger Vital Sign    Worried About Running Out of Food in the Last Year: Never true    Ran Out of Food in the Last Year: Sometimes true  Transportation Needs: Unmet Transportation Needs (08/09/2024)   PRAPARE - Administrator, Civil Service (Medical): Yes    Lack of Transportation (Non-Medical): Yes  Physical Activity: Insufficiently Active (08/09/2024)   Exercise Vital Sign    Days of Exercise per Week: 4 days    Minutes of Exercise per Session: 30 min  Stress: Stress Concern Present (08/09/2024)   [employer] of Occupational Health - Occupational Stress Questionnaire    Feeling of Stress: To some extent  Social Connections: Moderately Isolated (08/09/2024)   Social Connection and Isolation Panel    Frequency of Communication with Friends and Family: More than three times a week    Frequency of Social Gatherings with Friends and Family: More than three times a week    Attends Religious Services: More than 4 times per year    Active Member of [employer] or Organizations: No    Attends Banker Meetings: Not on file    Marital Status: Divorced  Intimate Partner Violence: Not At Risk (04/05/2024)   Humiliation, Afraid, Rape, and Kick questionnaire    Fear of Current or Ex-Partner: No    Emotionally Abused: No    Physically Abused: No    Sexually Abused: No    Physical Exam      Future Appointments  Date Time Provider Department Center  08/19/2024 12:30 PM [facility] LAB [facility] None  08/27/2024  1:40 PM [facility]NUAL WELLNESS VISIT [facility] [place]  09/05/2024 11:00 AM MC ECHO OP 1 [facility]  09/05/2024 12:00 PM Bensimhon, Toribio SAUNDERS, MD [facility] None  09/05/2024  2:00 PM El-Khouri, Evalene MATSU, RD [facility] NDM  09/13/2024 10:30 AM Fleeta Tonia Garnette LITTIE, RPH-CPP [facility] [address]  09/17/2024 11:30 AM Darlean Ozell NOVAK, MD [facility] [address]  10/08/2024  2:15 PM Gaynel Delon LITTIE, DPM [facility]   10/11/2024  8:30 AM Celestia [name] SQUIBB, NP [facility] [place]

## 2024-08-14 NOTE — Progress Notes (Signed)
 Pharmacy Quality Measure Review  This patient is appearing on a report for the adherence measure for cholesterol (statin) and diabetes medications this calendar year. All refills appear to be up to date.  Medication: atorvastatin  Last fill date: 08/03/2024 for 30 day supply  Medication: dapagliflozin   Last fill date: 07/22/2024 for 30 day supply  Medication: metformin  Last fill date: 08/03/2024 for 30 day supply  Medication: valsartan  Last fill date: 08/03/2024 for 90 day supply.   Refills are up to date. Reminder set for next fills. Of note, I did sent new rxns for atorvastatin  and metformin  for her to fill when needed.   Herlene Fleeta Morris, PharmD, JAQUELINE, CPP Clinical Pharmacist [facility] [phone]

## 2024-08-14 NOTE — Telephone Encounter (Signed)
 Pharmacy Patient Advocate Encounter  Received notification from [employer] that Prior Authorization for [facility] has been APPROVED from 08/12/2024 to 10/23/2025   PA #/Case ID/Reference #: [id number]

## 2024-08-16 ENCOUNTER — Other Ambulatory Visit: Payer: Self-pay

## 2024-08-19 ENCOUNTER — Other Ambulatory Visit: Payer: Self-pay | Admitting: Pharmacist

## 2024-08-19 ENCOUNTER — Ambulatory Visit (HOSPITAL_COMMUNITY)
Admission: RE | Admit: 2024-08-19 | Discharge: 2024-08-19 | Disposition: A | Discharge status: Home / Self Care | Source: Ambulatory Visit | Attending: Cardiology | Admitting: Cardiology

## 2024-08-19 ENCOUNTER — Ambulatory Visit (HOSPITAL_COMMUNITY): Payer: Self-pay | Admitting: Adult Health

## 2024-08-19 ENCOUNTER — Telehealth (HOSPITAL_COMMUNITY): Payer: Self-pay

## 2024-08-19 DIAGNOSIS — I5022 Chronic systolic (congestive) heart failure: Secondary | ICD-10-CM

## 2024-08-19 LAB — BASIC METABOLIC PANEL WITH GFR
Anion gap: 10 (ref 5–15)
BUN: 25 mg/dL — ABNORMAL HIGH (ref 6–20)
CO2: 29 mmol/L (ref 22–32)
Calcium: 9.2 mg/dL (ref 8.9–10.3)
Chloride: 100 mmol/L (ref 98–111)
Creatinine, Ser: 1.61 mg/dL — ABNORMAL HIGH (ref 0.44–1.00)
GFR, Estimated: 36 mL/min — ABNORMAL LOW (ref >60)
Glucose, Bld: 173 mg/dL — ABNORMAL HIGH (ref 70–99)
Potassium: 4 mmol/L (ref 3.5–5.1)
Sodium: 139 mmol/L (ref 135–145)

## 2024-08-19 NOTE — Telephone Encounter (Signed)
 Bensimhon, Toribio SAUNDERS, MD  Micael Sueanne LABOR, CMA; [place], Galva, FNP If weight still up. Double Lasix  to 40 bid for 2 days then back to 40 daily.       Previous Messages    ----- Message ----- From: Micael Sueanne LABOR, CMA Sent: 08/09/2024  11:27 AM EDT To: Toribio SAUNDERS Fuel, MD; Harlene CHRISTELLA Gainer, F* Subject: FW: weight gain                                Please advise if any orders wanted ----- Message ----- From: Claudene Mallory, Paramedic Sent: 08/09/2024   8:26 AM EDT To: [facility] Triage Pool Subject: weight gain                                    Ms. Hebel has a 5 lb weight gain since last weeks visit.  She denies chest pain or SOB.  Lung sounds clear. No peripheral edema.  Vitals:  102/80  HR 78 SpO2 96% CBG 323 Compliant all her meds.  Please advise of any orders desired.  Maeola Mallory Claudene, EMT-Paramedic [phone] 08/09/2024

## 2024-08-19 NOTE — Progress Notes (Signed)
 Pharmacy Quality Measure Review  This patient is appearing on a report for the adherence measure for cholesterol (statin) and diabetes medications this calendar year. All refills appear to be up to date.  Medication: atorvastatin  Last fill date: 08/03/2024 for 30 day supply  Medication: dapagliflozin   Last fill date: 07/22/2024 for 30 day supply  Medication: metformin  Last fill date: 08/03/2024 for 30 day supply  Medication: valsartan  Last fill date: 08/03/2024 for 90 day supply.   Refills are up to date for everything except Farxiga . Called pharmacy and authorized refills of the Farxiga .   Herlene Fleeta Morris, PharmD, JAQUELINE, CPP Clinical Pharmacist [facility] [phone]

## 2024-08-19 NOTE — Telephone Encounter (Signed)
-----   Message from [place] sent at 08/09/2024 11:57 AM EDT ----- Regarding: RE: weight gain Increase spiro to 25 mg daily. BMET in 1 week ----- Message ----- From: Micael Sueanne LABOR, CMA Sent: 08/09/2024  11:27 AM EDT To: Toribio JONELLE Fuel, MD; Harlene CHRISTELLA Gainer, F# Subject: FW: weight gain                                Please advise if any orders wanted ----- Message ----- From: Claudene Mallory, Paramedic Sent: 08/09/2024   8:26 AM EDT To: [facility] Triage Pool Subject: weight gain                                    Ms. Escudero has a 5 lb weight gain since last weeks visit.  She denies chest pain or SOB.  Lung sounds clear. No peripheral edema.  Vitals:  102/80  HR 78 SpO2 96% CBG 323 Compliant all her meds.  Please advise of any orders desired.  Maeola Mallory Claudene, EMT-Paramedic [phone] 08/09/2024

## 2024-08-20 ENCOUNTER — Other Ambulatory Visit (INDEPENDENT_AMBULATORY_CARE_PROVIDER_SITE_OTHER): Payer: Self-pay | Admitting: Primary Care

## 2024-08-20 ENCOUNTER — Telehealth (HOSPITAL_COMMUNITY): Payer: Self-pay | Admitting: Emergency Medicine

## 2024-08-20 ENCOUNTER — Telehealth (HOSPITAL_COMMUNITY): Payer: Self-pay | Admitting: Cardiology

## 2024-08-20 MED ORDER — FUROSEMIDE 40 MG PO TABS: 40.0000 mg | ORAL_TABLET | Freq: Every day | ORAL | 3 refills | Status: DC

## 2024-08-20 NOTE — Telephone Encounter (Signed)
 Stacey Lin called to request clarification   Reports two instructions given 10/27  Per Dr Bensimhon If weight still up. Double Lasix  to 40 bid for 2 days then back to 40 daily.    Per Amy [name],NP Renal function elevated. Please hold lasix  x 2 days then lasix  to 40 mg daily    Labs done 10/27 Cr 1.61 BUN 25 K 4.0  Per [facility] weight\s are  10/17 spiro increased to 25 daily after 5 lb weight gain . 10/22 179lb  Baseline 174-175  Lasix  40 daily Spiro 25 daily   Please advise

## 2024-08-20 NOTE — Telephone Encounter (Signed)
Emelia Salisbury with para medicine aware

## 2024-08-20 NOTE — Telephone Encounter (Signed)
 Reached out to Ms. Zunker to advise her that I have received the appropriate instructions for med change.  I will set her pill box up in the morning @ 9:30 to hold Lasix  x 2 days.    Mary Sharps, EMT-Paramedic [phone] 08/20/2024

## 2024-08-21 ENCOUNTER — Other Ambulatory Visit (HOSPITAL_COMMUNITY): Payer: Self-pay | Admitting: Emergency Medicine

## 2024-08-21 NOTE — Progress Notes (Signed)
 Paramedicine Encounter    Patient ID: Stacey Lin, female    DOB: April 21, 1966, 58 y.o.   MRN: [id number]   Complaints SOB- wheezing  Assessment A&O x 4, skin W&D w/ good color.  Denies chest pain.  Was doing a breathing treatment on my arrival w/ resolution of wheezing. No peripheral edema noted.  Compliance with meds YES  Pill box filled x 2 weeks  Refills needed NONE  Meds changes since last visit 2nd week of Ozempic shot .25  Holding torsemide  x 2 days then resume 40mg  daily.  Repeat labs 11/5 @ 11:00  Social changes NONE   BP 120/80 (BP Location: Right Arm, Patient Position: Sitting, Cuff Size: Normal)   Pulse 70   Resp 16   LMP  (LMP Unknown)   SpO2 97%  Weight yesterday- not taken Last visit weight-182 lb  Picked up Gabapentin  and Farxiga  from [facility] to facilitate med rec.  Med box reconciled x 2 weeks.  Per orders held Torsemide  2 days (Wed & Thurs) then back to 40mg  daily.  Advised pt that she needs to keep close watch on her weights and fluid intake.  She has recently started Ozempic and c/o some nausea w/ decreased appetite.  Weight is down today from last visit.  ACTION: Home visit completed  [name] [phone] 08/21/24  Patient Care Team: Celestia [name] SQUIBB, NP as PCP - General (Internal Medicine)  Patient Active Problem List   Diagnosis Date Noted   Low blood pressure, not hypotension (required pausing some of her GDMT) 04/18/2024   Pruritic intertrigo 04/18/2024   Hot flashes due to menopause 04/18/2024   Constipation 04/18/2024   Acute respiratory distress 04/05/2024   Lumbar radiculopathy 03/13/2024   CHF (congestive heart failure) (HCC) 02/29/2024   COPD with acute exacerbation (HCC) 02/28/2024   Respiratory failure with hypoxia (HCC) 11/21/2023   COPD exacerbation (HCC) 07/25/2023   Nonrheumatic aortic valve insufficiency    Elevated LFTs 04/08/2022   Acute metabolic encephalopathy 04/08/2022   Depressed mood  04/08/2022   Headache 04/07/2022   Obesity (BMI 30-39.9) 04/07/2022   Acute exacerbation of CHF (congestive heart failure) (HCC) 04/06/2022   Polysubstance abuse (HCC) 04/06/2022   Elevated troponin 04/06/2022   Controlled type 2 diabetes mellitus with hyperglycemia (HCC) 04/06/2022   Transaminitis 04/06/2022   Hypoalbuminemia 04/06/2022   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/11/2021   Upper airway cough syndrome 12/20/2018   Cigarette smoker 11/21/2018   Asthmatic bronchitis, moderate persistent, uncomplicated 11/20/2018   DOE (dyspnea on exertion) 04/14/2016   Streptococcus pneumoniae pneumonia 04/07/2016   Acute respiratory failure with hypoxia (HCC) 04/03/2016   Elevated lactic acid level 04/03/2016   Leukocytosis 04/03/2016   New onset type 2 diabetes mellitus (HCC) 04/03/2016   Abnormal urinalysis 04/03/2016   Trichomonas infection 04/03/2016   Non compliance w medication regimen 01/12/2016   COPD without exacerbation (HCC) 12/04/2014   AKI (acute kidney injury) 03/14/2014   Acute on chronic systolic heart failure (HCC) 03/14/2014   Musculoskeletal chest pain 03/14/2014   Dyspnea 12/03/2013   Periodic health assessment, general screening, adult 10/03/2013   Mitral valve regurgitation 08/22/2013   FIBROIDS, UTERUS 05/11/2010   ANEMIA, SECONDARY TO BLOOD LOSS 05/11/2010   Asthmatic bronchitis 05/11/2010   TOBACCO USER 07/07/2009   Essential hypertension, benign 06/19/2009   MENORRHAGIA 06/19/2009   COCAINE  ABUSE, HX OF 06/19/2009    Current Outpatient Medications:    albuterol  (VENTOLIN  HFA) 108 (90 Base) MCG/ACT inhaler, Inhale 2 puffs into the lungs every  4 (four) hours as needed for wheezing or shortness of breath., Disp: 17 g, Rfl: 1   allopurinol  (ZYLOPRIM ) 300 MG tablet, Take 300 mg by mouth 2 (two) times daily., Disp: , Rfl:    ammonium lactate  (LAC-HYDRIN ) 12 % lotion, Apply 1 Application topically as needed for dry skin., Disp: 400 g, Rfl: 5   atorvastatin   (LIPITOR) 40 MG tablet, Take 1 tablet (40 mg total) by mouth daily., Disp: 90 tablet, Rfl: 1   carvedilol  (COREG ) 3.125 MG tablet, Take 1 tablet (3.125 mg total) by mouth 2 (two) times daily with a meal., Disp: 60 tablet, Rfl: 5   dapagliflozin  propanediol (FARXIGA ) 10 MG TABS tablet, Take 1 tablet (10 mg total) by mouth daily., Disp: 90 tablet, Rfl: 1   famotidine  (PEPCID ) 20 MG tablet, One after supper, Disp: 90 tablet, Rfl: 1   gabapentin  (NEURONTIN ) 300 MG capsule, Take 1 capsule (300 mg total) by mouth 2 (two) times daily., Disp: 60 capsule, Rfl: 6   ipratropium-albuterol  (DUONEB) 0.5-2.5 (3) MG/3ML SOLN, Take 3 mLs by nebulization 2 (two) times daily., Disp: 180 mL, Rfl: 1   metFORMIN  (GLUCOPHAGE -XR) 500 MG 24 hr tablet, Take 1 tablet (500 mg total) by mouth daily., Disp: 90 tablet, Rfl: 1   pantoprazole  (PROTONIX ) 40 MG tablet, TAKE 1 TABLET(40 MG) BY MOUTH DAILY 30 TO 60 MINUTES BEFORE FIRST MEAL OF THE DAY, Disp: 90 tablet, Rfl: 2   Semaglutide,0.25 or 0.5MG /DOS, (OZEMPIC, 0.25 OR 0.5 MG/DOSE,) 2 MG/3ML SOPN, Inject 0.25 mg into the skin once a week., Disp: 3 mL, Rfl: 1   spironolactone  (ALDACTONE ) 25 MG tablet, Take 1 tablet (25 mg total) by mouth daily., Disp: 45 tablet, Rfl: 3   valsartan  (DIOVAN ) 40 MG tablet, Take 1 tablet (40 mg total) by mouth daily., Disp: 90 tablet, Rfl: 1   Accu-Chek Softclix Lancets lancets, Use as instructed. Check Blood sugars daily before breakfast, Disp: 100 each, Rfl: 12   Blood Glucose Monitoring Suppl (ACCU-CHEK GUIDE) w/Device KIT, Check Blood sugars daily before breakfast, Disp: 1 kit, Rfl: 0   budesonide  (PULMICORT ) 0.25 MG/2ML nebulizer solution, One vial twice daily with albuterol  in nebulizer, Disp: 120 mL, Rfl: 12   fluticasone  furoate-vilanterol (BREO ELLIPTA ) 100-25 MCG/ACT AEPB, Inhale 1 puff into the lungs daily. (Patient not taking: Reported on 08/21/2024), Disp: 1 each, Rfl: 11   furosemide  (LASIX ) 40 MG tablet, Take 1 tablet (40 mg total) by  mouth daily. May take an additional 40 mg as needed, Disp: 45 tablet, Rfl: 3   glucose blood (ACCU-CHEK GUIDE TEST) test strip, Use as instructed. Check Blood sugars daily before breakfast, Disp: 100 each, Rfl: 12   naloxone  (NARCAN ) nasal spray 4 mg/0.1 mL, Inject intranasal for overdose (Patient taking differently: Inject intranasal for overdose), Disp: 1 each, Rfl: 1   QUEtiapine  (SEROQUEL ) 50 MG tablet, Take 1 tablet (50 mg total) by mouth at bedtime. (Patient not taking: Reported on 08/21/2024), Disp: 90 tablet, Rfl: 1   traZODone  (DESYREL ) 50 MG tablet, Take 1-2 tablets (50-100 mg total) by mouth at bedtime as needed for sleep. (Patient not taking: Reported on 08/21/2024), Disp: 60 tablet, Rfl: 0 No Known Allergies   Social History   Socioeconomic History   Marital status: Divorced    Spouse name: Not on file   Number of children: Not on file   Years of education: Not on file   Highest education level: Some college, no degree  Occupational History   Not on file  Tobacco Use  Smoking status: Former    Current packs/day: 0.00    Average packs/day: 1 pack/day for 35.0 years (35.0 ttl pk-yrs)    Types: Cigarettes    Start date: 08/25/1984    Quit date: 08/26/2019    Years since quitting: 4.9   Smokeless tobacco: Never  Vaping Use   Vaping status: Former  Substance and Sexual Activity   Alcohol use: No    Alcohol/week: 0.0 standard drinks of alcohol   Drug use: Not Currently    Frequency: 4.0 times per week    Types: Crack cocaine     Comment: RELAPSE 12/2016   Sexual activity: Not on file  Other Topics Concern   Not on file  Social History Narrative   Lives with son in an apartment on the third floor.  Does not work.  On disability.  Previously worked in [employer].    Education: high school.     Social Drivers of Corporate Investment Banker Strain: Low Risk  (08/09/2024)   Overall Financial Resource Strain (CARDIA)    Difficulty of Paying Living Expenses: Not very hard   Food Insecurity: Food Insecurity Present (08/09/2024)   Hunger Vital Sign    Worried About Running Out of Food in the Last Year: Never true    Ran Out of Food in the Last Year: Sometimes true  Transportation Needs: Unmet Transportation Needs (08/09/2024)   PRAPARE - Administrator, Civil Service (Medical): Yes    Lack of Transportation (Non-Medical): Yes  Physical Activity: Insufficiently Active (08/09/2024)   Exercise Vital Sign    Days of Exercise per Week: 4 days    Minutes of Exercise per Session: 30 min  Stress: Stress Concern Present (08/09/2024)   [employer] of Occupational Health - Occupational Stress Questionnaire    Feeling of Stress: To some extent  Social Connections: Moderately Isolated (08/09/2024)   Social Connection and Isolation Panel    Frequency of Communication with Friends and Family: More than three times a week    Frequency of Social Gatherings with Friends and Family: More than three times a week    Attends Religious Services: More than 4 times per year    Active Member of [employer] or Organizations: No    Attends Banker Meetings: Not on file    Marital Status: Divorced  Intimate Partner Violence: Not At Risk (04/05/2024)   Humiliation, Afraid, Rape, and Kick questionnaire    Fear of Current or Ex-Partner: No    Emotionally Abused: No    Physically Abused: No    Sexually Abused: No    Physical Exam      Future Appointments  Date Time Provider Department Center  08/27/2024  1:40 PM [facility] V[facility] [place]  08/28/2024 11:00 AM [facility] LAB [facility] None  09/05/2024 11:00 AM MC ECHO OP 1 [facility]  09/05/2024 12:00 PM Bensimhon, Toribio SAUNDERS, MD [facility] None  09/05/2024  2:00 PM El-Khouri, Evalene MATSU, RD [facility] NDM  09/13/2024 10:30 AM Fleeta Tonia Garnette LITTIE, RPH-CPP [facility] [address]  09/17/2024 11:30 AM Darlean Ozell NOVAK, MD [facility] [address]  10/08/2024  2:15 PM Gaynel Delon LITTIE,  DPM [facility]  10/11/2024  8:30 AM Celestia [name] SQUIBB, NP [facility] [place]

## 2024-08-22 ENCOUNTER — Telehealth (HOSPITAL_COMMUNITY): Payer: Self-pay | Admitting: Emergency Medicine

## 2024-08-22 NOTE — Telephone Encounter (Signed)
 Requested medication (s) are due for refill today: Yes  Requested medication (s) are on the active medication list: Yes  Last refill:  03/03/24  Future visit scheduled: Yes  Notes to clinic:  Last filled by Dr. Sigurd.    Requested Prescriptions  Pending Prescriptions Disp Refills   albuterol  (VENTOLIN  HFA) 108 (90 Base) MCG/ACT inhaler [Pharmacy Med Name: ALBUTEROL  HFA INH (200 PUFFS) 8.5GM] 17 g 1    Sig: INHALE 2 PUFFS INTO THE LUNGS EVERY 4 HOURS AS NEEDED FOR WHEEZING OR SHORTNESS OF BREATH     Pulmonology:  Beta Agonists 2 Passed - 08/22/2024 12:26 PM      Passed - Last BP in normal range    BP Readings from Last 1 Encounters:  08/21/24 120/80         Passed - Last Heart Rate in normal range    Pulse Readings from Last 1 Encounters:  08/21/24 70         Passed - Valid encounter within last 12 months    Recent Outpatient Visits           1 week ago Controlled type 2 diabetes mellitus with hyperglycemia, without long-term current use of insulin  (HCC)   [facility] [place] Fleeta Morris, [place] L, RPH-CPP   1 month ago Type 2 diabetes mellitus with obesity   [facility] [place] Vicci Barnie NOVAK, MD   2 months ago Insomnia, unspecified type   [facility] Celestia [name] SQUIBB, NP   10 months ago Tobacco abuse   [facility] Celestia [name] SQUIBB, NP   1 year ago Controlled type 2 diabetes mellitus with hyperglycemia, without long-term current use of insulin  [facility])   [facility] [place] Fleeta Morris Garnette LITTIE, RPH-CPP

## 2024-08-22 NOTE — Telephone Encounter (Signed)
 Spoke w/ Ms. Bierly to follow up from yesterday's visit w/ med change.  Lasix  held yesterday and today due to kidney function.  Pt's weight today is 180lb w/ SOB.   Pt is 4lbs up from yesterday.  Will reach out to HF Triage for possible orders.    Mary Sharps, EMT-Paramedic [phone] 08/22/2024

## 2024-08-27 ENCOUNTER — Ambulatory Visit (INDEPENDENT_AMBULATORY_CARE_PROVIDER_SITE_OTHER)

## 2024-08-27 ENCOUNTER — Telehealth (HOSPITAL_COMMUNITY): Payer: Self-pay | Admitting: Emergency Medicine

## 2024-08-27 ENCOUNTER — Telehealth (HOSPITAL_COMMUNITY): Payer: Self-pay | Admitting: Cardiology

## 2024-08-27 NOTE — Telephone Encounter (Signed)
 Called to confirm/remind patient of their appointment [facility] on 08/27/24.   Appointment:   [x] Confirmed  [] Left mess   [] No answer/No voice mail  [] VM Full/unable to leave message  [] Phone not in service  Patient reminded to bring all medications and/or complete list.  Confirmed patient has transportation. Gave directions, instructed to utilize valet parking.

## 2024-08-27 NOTE — Telephone Encounter (Signed)
 Ms. Bancroft returned my call regarding tomorrow's visit.  She has labs @ 11:00.  I advised her that I will wait for the results to post and then call her to come by and do a visit.  She states she will be at the [facility] in [place] and I can to the visit there.    Mary Sharps, EMT-Paramedic [phone] 08/27/2024

## 2024-08-28 ENCOUNTER — Telehealth (HOSPITAL_COMMUNITY): Payer: Self-pay | Admitting: Emergency Medicine

## 2024-08-28 ENCOUNTER — Ambulatory Visit (HOSPITAL_COMMUNITY)
Admission: RE | Admit: 2024-08-28 | Discharge: 2024-08-28 | Disposition: A | Discharge status: Home / Self Care | Source: Ambulatory Visit | Attending: Cardiology | Admitting: Cardiology

## 2024-08-28 ENCOUNTER — Ambulatory Visit (HOSPITAL_COMMUNITY): Payer: Self-pay | Admitting: Adult Health

## 2024-08-28 DIAGNOSIS — I5022 Chronic systolic (congestive) heart failure: Secondary | ICD-10-CM | POA: Diagnosis present

## 2024-08-28 LAB — BASIC METABOLIC PANEL WITH GFR
Anion gap: 11 (ref 5–15)
BUN: 26 mg/dL — ABNORMAL HIGH (ref 6–20)
CO2: 27 mmol/L (ref 22–32)
Calcium: 9.1 mg/dL (ref 8.9–10.3)
Chloride: 99 mmol/L (ref 98–111)
Creatinine, Ser: 1.3 mg/dL — ABNORMAL HIGH (ref 0.44–1.00)
GFR, Estimated: 47 mL/min — ABNORMAL LOW (ref >60)
Glucose, Bld: 133 mg/dL — ABNORMAL HIGH (ref 70–99)
Potassium: 3.8 mmol/L (ref 3.5–5.1)
Sodium: 137 mmol/L (ref 135–145)

## 2024-08-28 NOTE — Telephone Encounter (Signed)
 Called to advise Ms. Scheiderer no med changes.  Offered to do a visit for med rec.  She states she still has a second box that I reconciled for her and she states she will use that and I will visit her on Tues. 09/03/24.    Mary Sharps, EMT-Paramedic [phone] 08/28/2024

## 2024-09-02 ENCOUNTER — Other Ambulatory Visit: Payer: Self-pay | Admitting: Pharmacist

## 2024-09-02 NOTE — Progress Notes (Signed)
 Pharmacy Quality Measure Review  This patient is appearing on a report for the adherence measure for cholesterol (statin) and diabetes medications this calendar year.  Medication: atorvastatin  Last fill date: 08/03/2024 for 30 day supply  Medication: dapagliflozin   Last fill date: 08/21/24 for 30 day supply  Medication: metformin  Last fill date: 08/03/2024 for 30 day supply  Medication: valsartan  Last fill date: 08/03/2024 for 90 day supply.   Refills are up to date for everything except atorvastatin . Called pharmacy and authorized refills of the Atorvastatin .   Herlene Fleeta Morris, PharmD, BCACP, CPP Clinical Pharmacist [facility] [phone]

## 2024-09-03 ENCOUNTER — Other Ambulatory Visit (INDEPENDENT_AMBULATORY_CARE_PROVIDER_SITE_OTHER): Payer: Self-pay | Admitting: Primary Care

## 2024-09-03 DIAGNOSIS — G4700 Insomnia, unspecified: Secondary | ICD-10-CM

## 2024-09-04 ENCOUNTER — Other Ambulatory Visit: Payer: Self-pay | Admitting: Pharmacist

## 2024-09-04 ENCOUNTER — Other Ambulatory Visit (HOSPITAL_COMMUNITY): Payer: Self-pay | Admitting: Emergency Medicine

## 2024-09-04 ENCOUNTER — Other Ambulatory Visit: Payer: Self-pay

## 2024-09-04 MED ORDER — OZEMPIC (0.25 OR 0.5 MG/DOSE) 2 MG/3ML ~~LOC~~ SOPN
0.5000 mg | PEN_INJECTOR | SUBCUTANEOUS | 1 refills | Status: DC
  Filled 2024-09-04: qty 3, 28d supply, fill #0

## 2024-09-04 NOTE — Telephone Encounter (Signed)
 Will forward to provider

## 2024-09-04 NOTE — Progress Notes (Signed)
 Pharmacy Quality Measure Review  This patient is appearing on a report for the adherence measure for cholesterol (statin) and diabetes medications this calendar year.  Medication: atorvastatin  Last fill date: 09/02/24 for 90 day supply  Medication: dapagliflozin   Last fill date: 08/21/24 for 30 day supply  Medication: metformin  Last fill date: 08/03/2024 for 30 day supply  Medication: valsartan  Last fill date: 08/03/2024 for 90 day supply.   Refills are up to date for everything except metformin . Reminder set for refill next week.   Herlene Fleeta Morris, PharmD, JAQUELINE, CPP Clinical Pharmacist [facility] [phone]

## 2024-09-04 NOTE — Progress Notes (Signed)
 Paramedicine Encounter    Patient ID: Stacey Lin, female    DOB: 06/19/1966, 58 y.o.   MRN: [id number]   Complaints NONE  Assessment A&O x 4, skin W&D w/ good color.  Pt denies chest pain or SOB.  Lung sounds clear throughout.  Today's visit is med rec only.  Pill box reconciled x 2 weeks. No peripheral edema noted.  Compliance with meds YES  Pill box filled 2 weeks  Refills needed Pantoprazole , Furosemide - called in and will be ready tomorrow for pick up @ [facility]  Meds changes since last visit NONE     Social changes NONE  Pt has appointment @ [facility] tomorrow for ECHO and see Dr. Bensimhon.  She is aware of this visit and states she plans to go.  ACTION: Home visit completed  [name] [phone] 09/04/24  Patient Care Team: Celestia [name] SQUIBB, NP as PCP - General (Internal Medicine)  Patient Active Problem List   Diagnosis Date Noted   Low blood pressure, not hypotension (required pausing some of her GDMT) 04/18/2024   Pruritic intertrigo 04/18/2024   Hot flashes due to menopause 04/18/2024   Constipation 04/18/2024   Acute respiratory distress 04/05/2024   Lumbar radiculopathy 03/13/2024   CHF (congestive heart failure) (HCC) 02/29/2024   COPD with acute exacerbation (HCC) 02/28/2024   Respiratory failure with hypoxia (HCC) 11/21/2023   COPD exacerbation (HCC) 07/25/2023   Nonrheumatic aortic valve insufficiency    Elevated LFTs 04/08/2022   Acute metabolic encephalopathy 04/08/2022   Depressed mood 04/08/2022   Headache 04/07/2022   Obesity (BMI 30-39.9) 04/07/2022   Acute exacerbation of CHF (congestive heart failure) (HCC) 04/06/2022   Polysubstance abuse (HCC) 04/06/2022   Elevated troponin 04/06/2022   Controlled type 2 diabetes mellitus with hyperglycemia (HCC) 04/06/2022   Transaminitis 04/06/2022   Hypoalbuminemia 04/06/2022   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/11/2021   Upper airway cough syndrome 12/20/2018   Cigarette  smoker 11/21/2018   Asthmatic bronchitis, moderate persistent, uncomplicated 11/20/2018   DOE (dyspnea on exertion) 04/14/2016   Streptococcus pneumoniae pneumonia 04/07/2016   Acute respiratory failure with hypoxia (HCC) 04/03/2016   Elevated lactic acid level 04/03/2016   Leukocytosis 04/03/2016   New onset type 2 diabetes mellitus (HCC) 04/03/2016   Abnormal urinalysis 04/03/2016   Trichomonas infection 04/03/2016   Non compliance w medication regimen 01/12/2016   COPD without exacerbation (HCC) 12/04/2014   AKI (acute kidney injury) 03/14/2014   Acute on chronic systolic heart failure (HCC) 03/14/2014   Musculoskeletal chest pain 03/14/2014   Dyspnea 12/03/2013   Periodic health assessment, general screening, adult 10/03/2013   Mitral valve regurgitation 08/22/2013   FIBROIDS, UTERUS 05/11/2010   ANEMIA, SECONDARY TO BLOOD LOSS 05/11/2010   Asthmatic bronchitis 05/11/2010   TOBACCO USER 07/07/2009   Essential hypertension, benign 06/19/2009   MENORRHAGIA 06/19/2009   COCAINE  ABUSE, HX OF 06/19/2009    Current Outpatient Medications:    albuterol  (VENTOLIN  HFA) 108 (90 Base) MCG/ACT inhaler, INHALE 2 PUFFS INTO THE LUNGS EVERY 4 HOURS AS NEEDED FOR WHEEZING OR SHORTNESS OF BREATH, Disp: 17 g, Rfl: 1   allopurinol  (ZYLOPRIM ) 300 MG tablet, Take 300 mg by mouth 2 (two) times daily., Disp: , Rfl:    ammonium lactate  (LAC-HYDRIN ) 12 % lotion, Apply 1 Application topically as needed for dry skin., Disp: 400 g, Rfl: 5   atorvastatin  (LIPITOR) 40 MG tablet, Take 1 tablet (40 mg total) by mouth daily., Disp: 90 tablet, Rfl: 1   carvedilol  (COREG ) 3.125 MG  tablet, Take 1 tablet (3.125 mg total) by mouth 2 (two) times daily with a meal., Disp: 60 tablet, Rfl: 5   dapagliflozin  propanediol (FARXIGA ) 10 MG TABS tablet, Take 1 tablet (10 mg total) by mouth daily., Disp: 90 tablet, Rfl: 1   famotidine  (PEPCID ) 20 MG tablet, One after supper, Disp: 90 tablet, Rfl: 1   furosemide  (LASIX ) 40 MG  tablet, Take 1 tablet (40 mg total) by mouth daily. May take an additional 40 mg as needed, Disp: 45 tablet, Rfl: 3   gabapentin  (NEURONTIN ) 300 MG capsule, Take 1 capsule (300 mg total) by mouth 2 (two) times daily., Disp: 60 capsule, Rfl: 6   ipratropium-albuterol  (DUONEB) 0.5-2.5 (3) MG/3ML SOLN, Take 3 mLs by nebulization 2 (two) times daily., Disp: 180 mL, Rfl: 1   metFORMIN  (GLUCOPHAGE -XR) 500 MG 24 hr tablet, Take 1 tablet (500 mg total) by mouth daily., Disp: 90 tablet, Rfl: 1   naloxone  (NARCAN ) nasal spray 4 mg/0.1 mL, Inject intranasal for overdose (Patient taking differently: Inject intranasal for overdose), Disp: 1 each, Rfl: 1   pantoprazole  (PROTONIX ) 40 MG tablet, TAKE 1 TABLET(40 MG) BY MOUTH DAILY 30 TO 60 MINUTES BEFORE FIRST MEAL OF THE DAY, Disp: 90 tablet, Rfl: 2   Semaglutide,0.25 or 0.5MG /DOS, (OZEMPIC, 0.25 OR 0.5 MG/DOSE,) 2 MG/3ML SOPN, Inject 0.5 mg into the skin once a week., Disp: 3 mL, Rfl: 1   spironolactone  (ALDACTONE ) 25 MG tablet, Take 1 tablet (25 mg total) by mouth daily., Disp: 45 tablet, Rfl: 3   valsartan  (DIOVAN ) 40 MG tablet, Take 1 tablet (40 mg total) by mouth daily., Disp: 90 tablet, Rfl: 1   Accu-Chek Softclix Lancets lancets, Use as instructed. Check Blood sugars daily before breakfast, Disp: 100 each, Rfl: 12   Blood Glucose Monitoring Suppl (ACCU-CHEK GUIDE) w/Device KIT, Check Blood sugars daily before breakfast, Disp: 1 kit, Rfl: 0   budesonide  (PULMICORT ) 0.25 MG/2ML nebulizer solution, One vial twice daily with albuterol  in nebulizer, Disp: 120 mL, Rfl: 12   fluticasone  furoate-vilanterol (BREO ELLIPTA ) 100-25 MCG/ACT AEPB, Inhale 1 puff into the lungs daily. (Patient not taking: Reported on 09/04/2024), Disp: 1 each, Rfl: 11   glucose blood (ACCU-CHEK GUIDE TEST) test strip, Use as instructed. Check Blood sugars daily before breakfast, Disp: 100 each, Rfl: 12   QUEtiapine  (SEROQUEL ) 50 MG tablet, Take 1 tablet (50 mg total) by mouth at bedtime.  (Patient not taking: Reported on 09/04/2024), Disp: 90 tablet, Rfl: 1   traZODone  (DESYREL ) 50 MG tablet, Take 1-2 tablets (50-100 mg total) by mouth at bedtime as needed for sleep. (Patient not taking: Reported on 09/04/2024), Disp: 60 tablet, Rfl: 0 No Known Allergies   Social History   Socioeconomic History   Marital status: Divorced    Spouse name: Not on file   Number of children: Not on file   Years of education: Not on file   Highest education level: Some college, no degree  Occupational History   Not on file  Tobacco Use   Smoking status: Former    Current packs/day: 0.00    Average packs/day: 1 pack/day for 35.0 years (35.0 ttl pk-yrs)    Types: Cigarettes    Start date: 08/25/1984    Quit date: 08/26/2019    Years since quitting: 5.0   Smokeless tobacco: Never  Vaping Use   Vaping status: Former  Substance and Sexual Activity   Alcohol use: No    Alcohol/week: 0.0 standard drinks of alcohol   Drug use: Not Currently  Frequency: 4.0 times per week    Types: Crack cocaine     Comment: RELAPSE 12/2016   Sexual activity: Not on file  Other Topics Concern   Not on file  Social History Narrative   Lives with son in an apartment on the third floor.  Does not work.  On disability.  Previously worked in [employer].    Education: high school.     Social Drivers of Corporate Investment Banker Strain: Low Risk  (08/09/2024)   Overall Financial Resource Strain (CARDIA)    Difficulty of Paying Living Expenses: Not very hard  Food Insecurity: Food Insecurity Present (08/09/2024)   Hunger Vital Sign    Worried About Running Out of Food in the Last Year: Never true    Ran Out of Food in the Last Year: Sometimes true  Transportation Needs: Unmet Transportation Needs (08/09/2024)   PRAPARE - Administrator, Civil Service (Medical): Yes    Lack of Transportation (Non-Medical): Yes  Physical Activity: Insufficiently Active (08/09/2024)   Exercise Vital Sign     Days of Exercise per Week: 4 days    Minutes of Exercise per Session: 30 min  Stress: Stress Concern Present (08/09/2024)   [employer] of Occupational Health - Occupational Stress Questionnaire    Feeling of Stress: To some extent  Social Connections: Moderately Isolated (08/09/2024)   Social Connection and Isolation Panel    Frequency of Communication with Friends and Family: More than three times a week    Frequency of Social Gatherings with Friends and Family: More than three times a week    Attends Religious Services: More than 4 times per year    Active Member of [employer] or Organizations: No    Attends Banker Meetings: Not on file    Marital Status: Divorced  Intimate Partner Violence: Not At Risk (04/05/2024)   Humiliation, Afraid, Rape, and Kick questionnaire    Fear of Current or Ex-Partner: No    Emotionally Abused: No    Physically Abused: No    Sexually Abused: No    Physical Exam      Future Appointments  Date Time Provider Department Center  09/05/2024 11:00 AM MC ECHO OP 1 [facility]  09/05/2024 12:00 PM Bensimhon, Toribio SAUNDERS, MD [facility] None  09/05/2024  2:00 PM El-Khouri, Evalene MATSU, RD [facility] NDM  09/13/2024 10:30 AM Fleeta Tonia Garnette LITTIE, RPH-CPP [facility] [address]  09/17/2024 11:30 AM Darlean Ozell NOVAK, MD [facility] [address]  10/08/2024  2:15 PM Gaynel Delon LITTIE, DPM [facility]  10/11/2024  8:30 AM Celestia [name] SQUIBB, NP [facility] [place]

## 2024-09-05 ENCOUNTER — Ambulatory Visit: Payer: Self-pay

## 2024-09-05 ENCOUNTER — Ambulatory Visit (HOSPITAL_BASED_OUTPATIENT_CLINIC_OR_DEPARTMENT_OTHER)
Admission: RE | Admit: 2024-09-05 | Discharge: 2024-09-05 | Disposition: A | Discharge status: Home / Self Care | Source: Ambulatory Visit | Attending: Internal Medicine | Admitting: Internal Medicine

## 2024-09-05 ENCOUNTER — Telehealth: Payer: Self-pay

## 2024-09-05 ENCOUNTER — Ambulatory Visit: Admitting: Dietician

## 2024-09-05 ENCOUNTER — Ambulatory Visit (HOSPITAL_COMMUNITY)
Admission: RE | Admit: 2024-09-05 | Discharge: 2024-09-05 | Disposition: A | Discharge status: Home / Self Care | Source: Ambulatory Visit | Attending: Internal Medicine | Admitting: Internal Medicine

## 2024-09-05 VITALS — BP 114/70 | HR 78 | Wt 176.8 lb

## 2024-09-05 DIAGNOSIS — Z79899 Other long term (current) drug therapy: Secondary | ICD-10-CM | POA: Diagnosis not present

## 2024-09-05 DIAGNOSIS — I083 Combined rheumatic disorders of mitral, aortic and tricuspid valves: Secondary | ICD-10-CM | POA: Insufficient documentation

## 2024-09-05 DIAGNOSIS — F1910 Other psychoactive substance abuse, uncomplicated: Secondary | ICD-10-CM | POA: Diagnosis not present

## 2024-09-05 DIAGNOSIS — F32A Depression, unspecified: Secondary | ICD-10-CM | POA: Diagnosis not present

## 2024-09-05 DIAGNOSIS — F1490 Cocaine use, unspecified, uncomplicated: Secondary | ICD-10-CM | POA: Diagnosis not present

## 2024-09-05 DIAGNOSIS — J449 Chronic obstructive pulmonary disease, unspecified: Secondary | ICD-10-CM | POA: Diagnosis not present

## 2024-09-05 DIAGNOSIS — I5021 Acute systolic (congestive) heart failure: Secondary | ICD-10-CM | POA: Diagnosis present

## 2024-09-05 DIAGNOSIS — Z7984 Long term (current) use of oral hypoglycemic drugs: Secondary | ICD-10-CM | POA: Insufficient documentation

## 2024-09-05 DIAGNOSIS — I110 Hypertensive heart disease with heart failure: Secondary | ICD-10-CM | POA: Insufficient documentation

## 2024-09-05 DIAGNOSIS — Z7985 Long-term (current) use of injectable non-insulin antidiabetic drugs: Secondary | ICD-10-CM | POA: Insufficient documentation

## 2024-09-05 DIAGNOSIS — I428 Other cardiomyopathies: Secondary | ICD-10-CM | POA: Diagnosis not present

## 2024-09-05 DIAGNOSIS — I5042 Chronic combined systolic (congestive) and diastolic (congestive) heart failure: Secondary | ICD-10-CM | POA: Diagnosis present

## 2024-09-05 DIAGNOSIS — I10 Essential (primary) hypertension: Secondary | ICD-10-CM | POA: Diagnosis not present

## 2024-09-05 DIAGNOSIS — I5022 Chronic systolic (congestive) heart failure: Secondary | ICD-10-CM | POA: Diagnosis not present

## 2024-09-05 DIAGNOSIS — D649 Anemia, unspecified: Secondary | ICD-10-CM | POA: Diagnosis not present

## 2024-09-05 DIAGNOSIS — Z87891 Personal history of nicotine dependence: Secondary | ICD-10-CM | POA: Diagnosis not present

## 2024-09-05 LAB — ECHOCARDIOGRAM COMPLETE
AR max vel: 2.22 cm2
AV Area VTI: 2.24 cm2
AV Area mean vel: 2.39 cm2
AV Mean grad: 5 mmHg
AV Peak grad: 11.3 mmHg
Ao pk vel: 1.68 m/s
Area-P 1/2: 4.24 cm2
Calc EF: 47.8 %
MV VTI: 2.32 cm2
S' Lateral: 3.6 cm
Single Plane A2C EF: 50.8 %
Single Plane A4C EF: 44.6 %

## 2024-09-05 NOTE — Progress Notes (Signed)
 H&V Care Navigation CSW Progress Note  Clinical Social Worker consulted to speak with pt regarding current mental health concerns.  Pt reports having strong feelings of depression over the past month.  Does not have any event that she attributes to feeling this way just states she is often crying for no reason.  States she used to be on antidepressants through PCP but those stopped she is unsure how long ago- CSW assisted in calling PCP office to inquire about getting restarted and getting visit to discuss- they will call pt directly to arrange.  Agreeable to referral to mental health office- clinic staff placed.  Pt reports no other needs at this time CSW will continue to follow and assist as needed  Raeanne Deschler H. Kymir Coles, LCSW Clinical Social Worker [facility] Desk#: [phone] Cell#: [phone]

## 2024-09-05 NOTE — Progress Notes (Signed)
 ReDS Vest / Clip - 09/05/24 1100       ReDS Vest / Clip   Station Marker A    Ruler Value 31.5    ReDS Value Range Low volume    ReDS Actual Value 19

## 2024-09-05 NOTE — Telephone Encounter (Signed)
 Kellen could you schedule pt an sooner appt  [name] states okay to schedule at [employer]

## 2024-09-05 NOTE — Telephone Encounter (Signed)
 This RN changed to Triage encounter. Will close this one.  Copied from CRM #[id number]. Topic: Clinical - Medication Question >> Sep 05, 2024 12:41 PM Cleave MATSU wrote: Reason for CRM: jenna from [facility] wants to know if a short supply of anti depressant because she's having a really hard time with depression right now. Please follow up with pt to discuss

## 2024-09-05 NOTE — Telephone Encounter (Signed)
 RN attempted contact x 2; LVM; will attempt contact at a later time.            Copied from CRM #[id number]. Topic: Clinical - Medication Question >> Sep 05, 2024 12:41 PM Cleave MATSU wrote: Reason for CRM: jenna from [facility] wants to know if a short supply of anti depressant because she's having a really hard time with depression right now. Please follow up with pt to discuss

## 2024-09-05 NOTE — Telephone Encounter (Signed)
 RN attempted contact x 3; LVM (phone going straight to VM) will route to clinic for follow-up.                Copied from CRM #[id number]. Topic: Clinical - Medication Question >> Sep 05, 2024 12:41 PM Cleave MATSU wrote: Reason for CRM: jenna from [facility] wants to know if a short supply of anti depressant because she's having a really hard time with depression right now. Please follow up with pt to discuss

## 2024-09-05 NOTE — Progress Notes (Signed)
 Patient ID: Stacey Lin, female   DOB: [birth year], 58 y.o.   MRN: [id number]    [facility] Note   PCP: Stacey [name] SQUIBB, NP Pulmonologist: Dr. Darlean HF Cardiologist: Dr. 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  EF as low as 30-35% in 2014.   cMRI 08/27/13: EF 32% RV mildly dilated, no scar or infiltrative disease. Mod MR and Mod-sev TR  R/LHC (06/06/14): ectatic coronaries (suspect d/t HTN) no significant arthrosclerosis and normal EF. No significant MR noted  Echo (05/2014): EF 55-60%, mild/mod AI, grade I DD and mild MR  EF stable at 55-60% in 2019.   She was admitted in 06/23 with acute respiratory failure with hypoxia 2/2 acute on chronic systolic CHF. EF down to 30-35% on echo with normal RV, severe BAE, moderate to severe MR, moderate to severe TR and moderate to severe AI. Diuresed and GDMT titrated. R/LHC with no CAD and reduced Fick CI of 1.96 L/min/m2.  Readmit 10/24 with COPD exacerbation in setting of noncompliance with medications and ongoing tobacco use + recent cocaine  use. Repeat echo with EF 40-45%.  Admitted 01/25 with acute respiratory failure 2/2 COPD exacerbation and influenza A. She also had acute on chronic CHF and diuresed with IV lasix .  Admitted in 03/2024 with pneumonia. Echo down 30-35%. Mild MR/ mild AS. She was not discharged on spiro due to elevated potassium.    Today she returns for HF follow up. Complaining of fatigue. Mild DOE but improved,  Denies PND/Orthopnea. Having trouble sleeping. Appetite ok. No fever or chills. Taking all medications. Followed by Paramedicine.    FH: Mother deceased: Dementia, seizures, stomach cancer        Father deceased: HTN  Review of systems complete and found to be negative unless listed in HPI.    Past  Medical History:  Diagnosis Date   Anemia    Arrhythmia    Asthma    CHF (congestive heart failure) (HCC)    1990s   Chronic headaches    COPD (chronic obstructive pulmonary disease) (HCC)    ??   Hypercholesteremia    Hypertension    Current Outpatient Medications  Medication Sig Dispense Refill   Accu-Chek Softclix Lancets lancets Use as instructed. Check Blood sugars daily before breakfast 100 each 12   albuterol  (VENTOLIN  HFA) 108 (90 Base) MCG/ACT inhaler INHALE 2 PUFFS INTO THE LUNGS EVERY 4 HOURS AS NEEDED FOR WHEEZING OR SHORTNESS OF BREATH 17 g 1   allopurinol  (ZYLOPRIM ) 300 MG tablet Take 300 mg by mouth 2 (two) times daily.     ammonium lactate  (LAC-HYDRIN ) 12 % lotion Apply 1 Application topically as needed for dry skin. 400 g 5   atorvastatin  (LIPITOR) 40 MG tablet Take 1 tablet (40 mg total) by mouth daily. 90 tablet 1   Blood Glucose Monitoring Suppl (ACCU-CHEK GUIDE) w/Device KIT Check Blood sugars daily before breakfast 1 kit 0   budesonide  (PULMICORT ) 0.25 MG/2ML nebulizer solution One vial twice daily with albuterol  in nebulizer 120 mL 12   carvedilol  (COREG ) 3.125 MG tablet Take 1 tablet (3.125 mg total) by mouth 2 (two) times daily with a meal. 60 tablet 5   dapagliflozin  propanediol (FARXIGA ) 10 MG TABS tablet Take 1 tablet (10 mg total) by mouth daily. 90 tablet 1   famotidine  (PEPCID ) 20 MG tablet  One after supper 90 tablet 1   fluticasone  furoate-vilanterol (BREO ELLIPTA ) 100-25 MCG/ACT AEPB Inhale 1 puff into the lungs daily. 1 each 11   furosemide  (LASIX ) 40 MG tablet Take 1 tablet (40 mg total) by mouth daily. May take an additional 40 mg as needed 45 tablet 3   gabapentin  (NEURONTIN ) 300 MG capsule Take 1 capsule (300 mg total) by mouth 2 (two) times daily. 60 capsule 6   glucose blood (ACCU-CHEK GUIDE TEST) test strip Use as instructed. Check Blood sugars daily before breakfast 100 each 12   ipratropium-albuterol  (DUONEB) 0.5-2.5 (3) MG/3ML SOLN Take 3 mLs  by nebulization 2 (two) times daily. 180 mL 1   metFORMIN  (GLUCOPHAGE -XR) 500 MG 24 hr tablet Take 1 tablet (500 mg total) by mouth daily. 90 tablet 1   naloxone  (NARCAN ) nasal spray 4 mg/0.1 mL Inject intranasal for overdose 1 each 1   pantoprazole  (PROTONIX ) 40 MG tablet TAKE 1 TABLET(40 MG) BY MOUTH DAILY 30 TO 60 MINUTES BEFORE FIRST MEAL OF THE DAY 90 tablet 2   QUEtiapine  (SEROQUEL ) 50 MG tablet Take 1 tablet (50 mg total) by mouth at bedtime. 90 tablet 1   Semaglutide,0.25 or 0.5MG /DOS, (OZEMPIC, 0.25 OR 0.5 MG/DOSE,) 2 MG/3ML SOPN Inject 0.5 mg into the skin once a week. 3 mL 1   spironolactone  (ALDACTONE ) 25 MG tablet Take 1 tablet (25 mg total) by mouth daily. 45 tablet 3   traZODone  (DESYREL ) 50 MG tablet Take 1-2 tablets (50-100 mg total) by mouth at bedtime as needed for sleep. 60 tablet 0   valsartan  (DIOVAN ) 40 MG tablet Take 1 tablet (40 mg total) by mouth daily. 90 tablet 1   No current facility-administered medications for this encounter.   Wt Readings from Last 3 Encounters:  09/05/24 80.2 kg (176 lb 12.8 oz)  08/21/24 79.8 kg (176 lb)  08/14/24 82.9 kg (182 lb 12.8 oz)    BP 114/70   Pulse 78   Wt 80.2 kg (176 lb 12.8 oz)   LMP  (LMP Unknown)   SpO2 97%   BMI 34.53 kg/m   PHYSICAL EXAM: General:   No resp difficulty. Tearful.  Neck: no JVD.  Cor: Regular rate & rhythm.  Lungs: clear Abdomen: soft, nontender, nondistended.  Extremities: no  edema Neuro: alert & oriented x3  ASSESSMENT & PLAN:   Chronic Systolic Heart Failure:   - Nonischemic cardiomyopathy.  - EF 30-35% in 2014 - Cath 8/15 showed normal cors  - Echo 6/19: EF 55-65%  - Echo 06/23: EF down to 30-35% on echo with normal RV, severe BAE, moderate to severe MR, moderate to severe TR and moderate to severe AI - Echo 10/24: EF 40-45%, RV okay, PASP 65 mmHg, moderate to severe MR, mild to moderate TR, moderate AI - Echo 5/25: EF 30-35%.  - echo today improved EF 45-50%. Dr Stacey Lin discussed and  reviewed.  - NYHA II. Appears euvolemic.  - Continue 12.5 mg spiro 25 mg daily.  - Continue Lasix  40 mg daily. - Continue Coreg  3.125 mg bid. - Continue Farxiga  10 mg daily. - Continue valsartan  40 mg daily.  2.  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  HTN - Stable.   4. Substance abuse - UDS 5/25 + cocaine   - No recent cocaine  use.   5. Tobacco Abuse - Denies smoking  6. SDOH - Now living with her son.  - Now followed by paramedicine for med compliance.  -  Gifted bus passes  7. Depression Refer to HFSW to coping strategies.   Follow up in 4months.   Greig Mosses NP-C  11:57 AM  Patient seen and examined with the above-signed Advanced Practice Provider and/or Housestaff. I personally reviewed laboratory data, imaging studies and relevant notes. I independently examined the patient and formulated the important aspects of the plan. I have edited the note to reflect any of my changes or salient points. I have personally discussed the plan with the patient and/or family.  She is here for f/u. Reports abstinence from substance use. Still with some DOE. No CP or edema. Compliant with meds.   Echo today 09/05/24  EF 45-50% Personally reviewed  General:  Sitting up. No resp difficulty HEENT: normal Neck: supple. no JVD.  Cor: Regular rate & rhythm. No rubs, gallops or murmurs. Lungs: clear Abdomen: soft, nontender, nondistended.Good bowel sounds. Extremities: no cyanosis, clubbing, rash, edema Neuro: alert & orientedx3, cranial nerves grossly intact. moves all 4 extremities w/o difficulty. Affect pleasant  Improved. NYHA II. EF improved on echo. On good GDMT. Abstaining from substance abuse per her repert. Continue current regimen. Continue Paramedicine support.   Toribio Fuel, MD  10:01 PM

## 2024-09-05 NOTE — Patient Instructions (Addendum)
 GREAT TO SEE YOU TODAY! NO CHANGES   Follow-Up in: 4 MONTHS WITH DR. CHERRIE PLEASE CALL OUR OFFICE AROUND JANUARY TO GET SCHEDULED FOR YOUR APPOINTMENT. PHONE NUMBER IS [phone] OPTION 2   At the [facility], you and your health needs are our priority. We have a designated team specialized in the treatment of Heart Failure. This Care Team includes your primary Heart Failure Specialized Cardiologist (physician), Advanced Practice Providers (APPs- Physician Assistants and Nurse Practitioners), and Pharmacist who all work together to provide you with the care you need, when you need it.   You may see any of the following providers on your designated Care Team at your next follow up:  Dr. Toribio Cherrie Dr. Ezra Shuck Dr. Odis Brownie Greig Mosses, NP Caffie Shed, [place] [facility] [place], [place] Beckey Coe, NP Jordan Lee, NP Tinnie Redman, PharmD   Please be sure to bring in all your medications bottles to every appointment.   Need to Contact Us :  If you have any questions or concerns before your next appointment please send us  a message through [place] or call our office at [phone].    TO LEAVE A MESSAGE FOR THE NURSE SELECT OPTION 2, PLEASE LEAVE A MESSAGE INCLUDING: YOUR NAME DATE OF BIRTH CALL BACK NUMBER REASON FOR CALL**this is important as we prioritize the call backs  YOU WILL RECEIVE A CALL BACK THE SAME DAY AS LONG AS YOU CALL BEFORE 4:00 PM

## 2024-09-05 NOTE — Telephone Encounter (Signed)
 This RN made first attempt to contact pt with no answer.      Copied from CRM #[id number]. Topic: Clinical - Medication Question >> Sep 05, 2024 12:41 PM Cleave MATSU wrote: Reason for CRM: jenna from [facility] wants to know if a short supply of anti depressant because she's having a really hard time with depression right now. Please follow up with pt to discuss

## 2024-09-06 ENCOUNTER — Telehealth (INDEPENDENT_AMBULATORY_CARE_PROVIDER_SITE_OTHER): Payer: Self-pay | Admitting: Primary Care

## 2024-09-06 NOTE — Telephone Encounter (Signed)
 Provider spoke with pt this morning

## 2024-09-06 NOTE — Telephone Encounter (Signed)
 Called pt to sch sooner appt. Pt did not answer and LVM.

## 2024-09-06 NOTE — Telephone Encounter (Signed)
 Called pt to schedule sooner appt. Please advise and call office if pt call back.

## 2024-09-09 ENCOUNTER — Ambulatory Visit (INDEPENDENT_AMBULATORY_CARE_PROVIDER_SITE_OTHER): Admitting: Primary Care

## 2024-09-09 ENCOUNTER — Telehealth: Payer: Self-pay

## 2024-09-09 ENCOUNTER — Encounter (INDEPENDENT_AMBULATORY_CARE_PROVIDER_SITE_OTHER): Payer: Self-pay | Admitting: Primary Care

## 2024-09-09 VITALS — BP 104/68 | HR 83 | Resp 16 | Wt 176.8 lb

## 2024-09-09 DIAGNOSIS — Z9189 Other specified personal risk factors, not elsewhere classified: Secondary | ICD-10-CM

## 2024-09-09 DIAGNOSIS — F418 Other specified anxiety disorders: Secondary | ICD-10-CM | POA: Diagnosis not present

## 2024-09-09 DIAGNOSIS — Z129 Encounter for screening for malignant neoplasm, site unspecified: Secondary | ICD-10-CM | POA: Diagnosis not present

## 2024-09-09 MED ORDER — BUSPIRONE HCL 5 MG PO TABS: 5.0000 mg | ORAL_TABLET | Freq: Two times a day (BID) | ORAL | 1 refills | Status: DC

## 2024-09-09 MED ORDER — QUETIAPINE FUMARATE 100 MG PO TABS: 100.0000 mg | ORAL_TABLET | Freq: Every day | ORAL | 1 refills | Status: AC

## 2024-09-09 NOTE — Progress Notes (Signed)
 [facility]  Stacey Lin, is a 58 y.o. female  RDW:[id number]  FMW:[id number]  DOB - 07-16-1966  Chief Complaint  Patient presents with   Depression       Subjective:   Stacey Lin is a 58 y.o. female here today for an acute visit.  Depression         No problems updated.  Comprehensive ROS Pertinent positive and negative noted in HPI   No Known Allergies  Past Medical History:  Diagnosis Date   Anemia    Arrhythmia    Asthma    CHF (congestive heart failure) (HCC)    1990s   Chronic headaches    COPD (chronic obstructive pulmonary disease) (HCC)    ??   Hypercholesteremia    Hypertension     Current Outpatient Medications on File Prior to Visit  Medication Sig Dispense Refill   Accu-Chek Softclix Lancets lancets Use as instructed. Check Blood sugars daily before breakfast 100 each 12   albuterol  (VENTOLIN  HFA) 108 (90 Base) MCG/ACT inhaler INHALE 2 PUFFS INTO THE LUNGS EVERY 4 HOURS AS NEEDED FOR WHEEZING OR SHORTNESS OF BREATH 17 g 1   allopurinol  (ZYLOPRIM ) 300 MG tablet Take 300 mg by mouth 2 (two) times daily.     ammonium lactate  (LAC-HYDRIN ) 12 % lotion Apply 1 Application topically as needed for dry skin. 400 g 5   atorvastatin  (LIPITOR) 40 MG tablet Take 1 tablet (40 mg total) by mouth daily. 90 tablet 1   Blood Glucose Monitoring Suppl (ACCU-CHEK GUIDE) w/Device KIT Check Blood sugars daily before breakfast 1 kit 0   budesonide  (PULMICORT ) 0.25 MG/2ML nebulizer solution One vial twice daily with albuterol  in nebulizer 120 mL 12   carvedilol  (COREG ) 3.125 MG tablet Take 1 tablet (3.125 mg total) by mouth 2 (two) times daily with a meal. 60 tablet 5   dapagliflozin  propanediol (FARXIGA ) 10 MG TABS tablet Take 1 tablet (10 mg total) by mouth daily. 90 tablet 1   famotidine  (PEPCID ) 20 MG tablet One after supper 90 tablet 1   fluticasone  furoate-vilanterol (BREO ELLIPTA ) 100-25 MCG/ACT AEPB Inhale 1 puff into the lungs daily. 1 each  11   furosemide  (LASIX ) 40 MG tablet Take 1 tablet (40 mg total) by mouth daily. May take an additional 40 mg as needed 45 tablet 3   gabapentin  (NEURONTIN ) 300 MG capsule Take 1 capsule (300 mg total) by mouth 2 (two) times daily. 60 capsule 6   glucose blood (ACCU-CHEK GUIDE TEST) test strip Use as instructed. Check Blood sugars daily before breakfast 100 each 12   ipratropium-albuterol  (DUONEB) 0.5-2.5 (3) MG/3ML SOLN Take 3 mLs by nebulization 2 (two) times daily. 180 mL 1   metFORMIN  (GLUCOPHAGE -XR) 500 MG 24 hr tablet Take 1 tablet (500 mg total) by mouth daily. 90 tablet 1   naloxone  (NARCAN ) nasal spray 4 mg/0.1 mL Inject intranasal for overdose 1 each 1   pantoprazole  (PROTONIX ) 40 MG tablet TAKE 1 TABLET(40 MG) BY MOUTH DAILY 30 TO 60 MINUTES BEFORE FIRST MEAL OF THE DAY 90 tablet 2   QUEtiapine  (SEROQUEL ) 50 MG tablet TAKE 1 TABLET(50 MG) BY MOUTH AT BEDTIME 90 tablet 1   Semaglutide,0.25 or 0.5MG /DOS, (OZEMPIC, 0.25 OR 0.5 MG/DOSE,) 2 MG/3ML SOPN Inject 0.5 mg into the skin once a week. 3 mL 1   spironolactone  (ALDACTONE ) 25 MG tablet Take 1 tablet (25 mg total) by mouth daily. 45 tablet 3   traZODone  (DESYREL ) 50 MG tablet Take 1-2 tablets (50-100 mg  total) by mouth at bedtime as needed for sleep. 60 tablet 0   valsartan  (DIOVAN ) 40 MG tablet Take 1 tablet (40 mg total) by mouth daily. 90 tablet 1   No current facility-administered medications on file prior to visit.   Health Maintenance  Topic Date Due   Medicare Annual Wellness Visit  Never done   COVID-19 Vaccine (1) Never done   Zoster (Shingles) Vaccine (1 of 2) Never done   Colon Cancer Screening  Never done   Stool Blood Test  09/18/2019   Pap with HPV screening  02/22/2021   Screening for Lung Cancer  04/13/2023   Eye exam for diabetics  05/19/2023   Hemoglobin A1C  01/09/2025   Complete foot exam   03/13/2025   Yearly kidney health urinalysis for diabetes  08/13/2025   Yearly kidney function blood test for diabetes   08/28/2025   Breast Cancer Screening  01/14/2026   DTaP/Tdap/Td vaccine (2 - Td or Tdap) 02/23/2028   Pneumococcal Vaccine for age over 58  Completed   Flu Shot  Completed   Hepatitis C Screening  Completed   HIV Screening  Completed   Hepatitis B Vaccine  Aged Out   HPV Vaccine  Aged Out   Meningitis B Vaccine  Aged Out   Cologuard (Stool DNA test)  Discontinued    Objective:   Vitals:   09/09/24 1112  BP: 104/68  Pulse: 83  Resp: 16  SpO2: 100%  Weight: 176 lb 12.8 oz (80.2 kg)   BP Readings from Last 3 Encounters:  09/09/24 104/68  09/05/24 114/70  08/21/24 120/80      Physical Exam Vitals reviewed.  Constitutional:      Appearance: Normal appearance. She is obese.  HENT:     Head: Normocephalic.     Right Ear: Tympanic membrane, ear canal and external ear normal.     Left Ear: Tympanic membrane, ear canal and external ear normal.     Nose: Nose normal.     Mouth/Throat:     Mouth: Mucous membranes are moist.  Eyes:     Extraocular Movements: Extraocular movements intact.     Pupils: Pupils are equal, round, and reactive to light.  Cardiovascular:     Rate and Rhythm: Normal rate.  Pulmonary:     Effort: Pulmonary effort is normal.     Breath sounds: Normal breath sounds.  Abdominal:     General: Bowel sounds are normal.     Palpations: Abdomen is soft.  Musculoskeletal:        General: Normal range of motion.     Cervical back: Normal range of motion.  Skin:    General: Skin is warm and dry.  Neurological:     Mental Status: She is alert and oriented to person, place, and time.  Psychiatric:        Mood and Affect: Mood normal.        Behavior: Behavior normal.        Thought Content: Thought content normal.     Assessment & Plan  Stacey Lin was seen today for depression.  Diagnoses and all orders for this visit:  Depression with anxiety -     QUEtiapine  (SEROQUEL ) 100 MG tablet; Take 1 tablet (100 mg total) by mouth at bedtime. -      busPIRone (BUSPAR) 5 MG tablet; Take 1 tablet (5 mg total) by mouth 2 (two) times daily.  Cancer screening -     Ambulatory Referral for Lung Cancer  Screening [REF832]   COCAINE  ABUSE, HX OF   Patient have been counseled extensively about nutrition and exercise. Other issues discussed during this visit include: low cholesterol diet, weight control and daily exercise, foot care, annual eye examinations at Ophthalmology, importance of adherence with medications and regular follow-up. We also discussed long term complications of uncontrolled diabetes and hypertension.   2 weeks  The patient was given clear instructions to go to ER or return to medical center if symptoms don't improve, worsen or new problems develop. The patient verbalized understanding. The patient was told to call to get lab results if they haven't heard anything in the next week.   This note has been created with Education officer, environmental. Any transcriptional errors are unintentional.   [name] SHAUNNA Bohr, NP 09/09/2024, 11:31 AM

## 2024-09-09 NOTE — Telephone Encounter (Signed)
 Hi Stacey Lin!  I spoke with the patient this morning when she was at her appointment at [facility].  She is interested in support to help her address her substance use and anxiety and depression.  I told her that I would reach out to you for resources and she liked that idea because she said she really likes you!  Would you be able to call her when you have a chance?     Thanks

## 2024-09-11 ENCOUNTER — Other Ambulatory Visit: Payer: Self-pay

## 2024-09-11 ENCOUNTER — Telehealth (HOSPITAL_COMMUNITY): Payer: Self-pay | Admitting: Licensed Clinical Social Worker

## 2024-09-11 ENCOUNTER — Other Ambulatory Visit (HOSPITAL_COMMUNITY): Payer: Self-pay | Admitting: Emergency Medicine

## 2024-09-11 ENCOUNTER — Other Ambulatory Visit: Payer: Self-pay | Admitting: Pharmacist

## 2024-09-11 DIAGNOSIS — I5022 Chronic systolic (congestive) heart failure: Secondary | ICD-10-CM

## 2024-09-11 NOTE — Progress Notes (Unsigned)
 Paramedicine Encounter    Patient ID: Stacey Lin, female    DOB: [birth year], 58 y.o.   MRN: [id number]   Complaints***  Assessment***  Compliance with meds***  Pill box filled***  Refills needed Trazadone, Ozempic  Meds changes since last visit***    Social changes***   LMP  (LMP Unknown)  Weight yesterday-*** Last visit weight-***  ACTION: {Paramed [phone]}  Mary Sharps, EMT-Paramedic [phone] 09/11/24  Patient Care Team: Celestia [name] SQUIBB, NP as PCP - General (Internal Medicine)  Patient Active Problem List   Diagnosis Date Noted  . Low blood pressure, not hypotension (required pausing some of her GDMT) 04/18/2024  . Pruritic intertrigo 04/18/2024  . Hot flashes due to menopause 04/18/2024  . Constipation 04/18/2024  . Acute respiratory distress 04/05/2024  . Lumbar radiculopathy 03/13/2024  . CHF (congestive heart failure) (HCC) 02/29/2024  . COPD with acute exacerbation (HCC) 02/28/2024  . Respiratory failure with hypoxia (HCC) 11/21/2023  . COPD exacerbation (HCC) 07/25/2023  . Nonrheumatic aortic valve insufficiency   . Elevated LFTs 04/08/2022  . Acute metabolic encephalopathy 04/08/2022  . Depressed mood 04/08/2022  . Headache 04/07/2022  . Obesity (BMI 30-39.9) 04/07/2022  . Acute exacerbation of CHF (congestive heart failure) (HCC) 04/06/2022  . Polysubstance abuse (HCC) 04/06/2022  . Elevated troponin 04/06/2022  . Controlled type 2 diabetes mellitus with hyperglycemia (HCC) 04/06/2022  . Transaminitis 04/06/2022  . Hypoalbuminemia 04/06/2022  . 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/11/2021  . Upper airway cough syndrome 12/20/2018  . Cigarette smoker 11/21/2018  . Asthmatic bronchitis, moderate persistent, uncomplicated 11/20/2018  . DOE (dyspnea on exertion) 04/14/2016  . Streptococcus pneumoniae pneumonia 04/07/2016  . Acute respiratory failure with hypoxia (HCC) 04/03/2016  . Elevated lactic acid level 04/03/2016   . Leukocytosis 04/03/2016  . New onset type 2 diabetes mellitus (HCC) 04/03/2016  . Abnormal urinalysis 04/03/2016  . Trichomonas infection 04/03/2016  . Non compliance w medication regimen 01/12/2016  . COPD without exacerbation (HCC) 12/04/2014  . AKI (acute kidney injury) 03/14/2014  . Acute on chronic systolic heart failure (HCC) 03/14/2014  . Musculoskeletal chest pain 03/14/2014  . Dyspnea 12/03/2013  . Periodic health assessment, general screening, adult 10/03/2013  . Mitral valve regurgitation 08/22/2013  . FIBROIDS, UTERUS 05/11/2010  . ANEMIA, SECONDARY TO BLOOD LOSS 05/11/2010  . Asthmatic bronchitis 05/11/2010  . TOBACCO USER 07/07/2009  . Essential hypertension, benign 06/19/2009  . MENORRHAGIA 06/19/2009  . COCAINE  ABUSE, HX OF 06/19/2009    Current Outpatient Medications:  .  Accu-Chek Softclix Lancets lancets, Use as instructed. Check Blood sugars daily before breakfast, Disp: 100 each, Rfl: 12 .  albuterol  (VENTOLIN  HFA) 108 (90 Base) MCG/ACT inhaler, INHALE 2 PUFFS INTO THE LUNGS EVERY 4 HOURS AS NEEDED FOR WHEEZING OR SHORTNESS OF BREATH, Disp: 17 g, Rfl: 1 .  allopurinol  (ZYLOPRIM ) 300 MG tablet, Take 300 mg by mouth 2 (two) times daily., Disp: , Rfl:  .  ammonium lactate  (LAC-HYDRIN ) 12 % lotion, Apply 1 Application topically as needed for dry skin., Disp: 400 g, Rfl: 5 .  atorvastatin  (LIPITOR) 40 MG tablet, Take 1 tablet (40 mg total) by mouth daily., Disp: 90 tablet, Rfl: 1 .  Blood Glucose Monitoring Suppl (ACCU-CHEK GUIDE) w/Device KIT, Check Blood sugars daily before breakfast, Disp: 1 kit, Rfl: 0 .  budesonide  (PULMICORT ) 0.25 MG/2ML nebulizer solution, One vial twice daily with albuterol  in nebulizer, Disp: 120 mL, Rfl: 12 .  busPIRone (BUSPAR) 5 MG tablet, Take 1 tablet (5 mg total) by mouth 2 (  two) times daily., Disp: 120 tablet, Rfl: 1 .  carvedilol  (COREG ) 3.125 MG tablet, Take 1 tablet (3.125 mg total) by mouth 2 (two) times daily with a meal., Disp:  60 tablet, Rfl: 5 .  dapagliflozin  propanediol (FARXIGA ) 10 MG TABS tablet, Take 1 tablet (10 mg total) by mouth daily., Disp: 90 tablet, Rfl: 1 .  famotidine  (PEPCID ) 20 MG tablet, One after supper, Disp: 90 tablet, Rfl: 1 .  fluticasone  furoate-vilanterol (BREO ELLIPTA ) 100-25 MCG/ACT AEPB, Inhale 1 puff into the lungs daily., Disp: 1 each, Rfl: 11 .  furosemide  (LASIX ) 40 MG tablet, Take 1 tablet (40 mg total) by mouth daily. May take an additional 40 mg as needed, Disp: 45 tablet, Rfl: 3 .  gabapentin  (NEURONTIN ) 300 MG capsule, Take 1 capsule (300 mg total) by mouth 2 (two) times daily., Disp: 60 capsule, Rfl: 6 .  glucose blood (ACCU-CHEK GUIDE TEST) test strip, Use as instructed. Check Blood sugars daily before breakfast, Disp: 100 each, Rfl: 12 .  ipratropium-albuterol  (DUONEB) 0.5-2.5 (3) MG/3ML SOLN, Take 3 mLs by nebulization 2 (two) times daily., Disp: 180 mL, Rfl: 1 .  metFORMIN  (GLUCOPHAGE -XR) 500 MG 24 hr tablet, Take 1 tablet (500 mg total) by mouth daily., Disp: 90 tablet, Rfl: 1 .  naloxone  (NARCAN ) nasal spray 4 mg/0.1 mL, Inject intranasal for overdose, Disp: 1 each, Rfl: 1 .  pantoprazole  (PROTONIX ) 40 MG tablet, TAKE 1 TABLET(40 MG) BY MOUTH DAILY 30 TO 60 MINUTES BEFORE FIRST MEAL OF THE DAY, Disp: 90 tablet, Rfl: 2 .  QUEtiapine  (SEROQUEL ) 100 MG tablet, Take 1 tablet (100 mg total) by mouth at bedtime., Disp: 90 tablet, Rfl: 1 .  Semaglutide,0.25 or 0.5MG /DOS, (OZEMPIC, 0.25 OR 0.5 MG/DOSE,) 2 MG/3ML SOPN, Inject 0.5 mg into the skin once a week., Disp: 3 mL, Rfl: 1 .  spironolactone  (ALDACTONE ) 25 MG tablet, Take 1 tablet (25 mg total) by mouth daily., Disp: 45 tablet, Rfl: 3 .  traZODone  (DESYREL ) 50 MG tablet, Take 1-2 tablets (50-100 mg total) by mouth at bedtime as needed for sleep., Disp: 60 tablet, Rfl: 0 .  valsartan  (DIOVAN ) 40 MG tablet, Take 1 tablet (40 mg total) by mouth daily., Disp: 90 tablet, Rfl: 1 No Known Allergies   Social History   Socioeconomic  History  . Marital status: Divorced    Spouse name: Not on file  . Number of children: Not on file  . Years of education: Not on file  . Highest education level: Some college, no degree  Occupational History  . Not on file  Tobacco Use  . Smoking status: Former    Current packs/day: 0.00    Average packs/day: 1 pack/day for 35.0 years (35.0 ttl pk-yrs)    Types: Cigarettes    Start date: 08/25/1984    Quit date: 08/26/2019    Years since quitting: 5.0  . Smokeless tobacco: Never  Vaping Use  . Vaping status: Former  Substance and Sexual Activity  . Alcohol use: No    Alcohol/week: 0.0 standard drinks of alcohol  . Drug use: Not Currently    Frequency: 4.0 times per week    Types: Crack cocaine     Comment: RELAPSE 12/2016  . Sexual activity: Not on file  Other Topics Concern  . Not on file  Social History Narrative   Lives with son in an apartment on the third floor.  Does not work.  On disability.  Previously worked in [employer].    Education: high school.  Social Drivers of Corporate Investment Banker Strain: Low Risk  (08/09/2024)   Overall Financial Resource Strain (CARDIA)   . Difficulty of Paying Living Expenses: Not very hard  Food Insecurity: Food Insecurity Present (08/09/2024)   Hunger Vital Sign   . Worried About Programme Researcher, Broadcasting/film/video in the Last Year: Never true   . Ran Out of Food in the Last Year: Sometimes true  Transportation Needs: Unmet Transportation Needs (08/09/2024)   PRAPARE - Transportation   . Lack of Transportation (Medical): Yes   . Lack of Transportation (Non-Medical): Yes  Physical Activity: Insufficiently Active (08/09/2024)   Exercise Vital Sign   . Days of Exercise per Week: 4 days   . Minutes of Exercise per Session: 30 min  Stress: Stress Concern Present (08/09/2024)   [employer] of Occupational Health - Occupational Stress Questionnaire   . Feeling of Stress: To some extent  Social Connections: Moderately Isolated  (08/09/2024)   Social Connection and Isolation Panel   . Frequency of Communication with Friends and Family: More than three times a week   . Frequency of Social Gatherings with Friends and Family: More than three times a week   . Attends Religious Services: More than 4 times per year   . Active Member of Clubs or Organizations: No   . Attends Banker Meetings: Not on file   . Marital Status: Divorced  Catering Manager Violence: Not At Risk (04/05/2024)   Humiliation, Afraid, Rape, and Kick questionnaire   . Fear of Current or Ex-Partner: No   . Emotionally Abused: No   . Physically Abused: No   . Sexually Abused: No    Physical Exam      Future Appointments  Date Time Provider Department Center  09/13/2024 10:30 AM Fleeta Tonia Garnette LITTIE, RPH-CPP [facility] [address]  09/17/2024 11:30 AM Darlean Ozell NOVAK, MD [facility] [address]  10/08/2024  2:15 PM Gaynel Delon LITTIE, DPM [facility]  10/11/2024  8:30 AM Celestia [name] SQUIBB, NP [facility] [place]

## 2024-09-11 NOTE — Progress Notes (Signed)
 Pharmacy Quality Measure Review  This patient is appearing on a report for the adherence measure for cholesterol (statin) and diabetes medications this calendar year.  Medication: atorvastatin  Last fill date: 09/02/24 for 90 day supply  Medication: dapagliflozin   Last fill date: 08/21/24 for 30 day supply  Medication: metformin  Last fill date: 09/10/24 for 90 day supply  Medication: valsartan  Last fill date: 08/03/2024 for 90 day supply.   Medication: Ozempic Last fill date: 08/13/24 for 28 day supply.   Refills are up to date for everything except Ozempic. Collaborated with pharmacy to fill this today.   Herlene Fleeta Morris, PharmD, JAQUELINE, CPP Clinical Pharmacist [facility] [phone]

## 2024-09-11 NOTE — Telephone Encounter (Signed)
 H&V Care Navigation CSW Progress Note  Clinical Social Worker followed up with pt regarding mental health referral sent at appt on Monday- referral being processed and patient will be contacted.  Pt also wanting support with substance use concerns.  No currently using but trying to do it on their own and wanting to learn about resources.  Sent list of local resources and bus passes to help her attend appts as needed.  Will continue to follow and assist as needed  Andriette HILARIO Leech, LCSW Clinical Social Worker [facility] Desk#: [phone] Cell#: [phone]

## 2024-09-12 ENCOUNTER — Telehealth: Payer: Self-pay | Admitting: Primary Care

## 2024-09-12 ENCOUNTER — Other Ambulatory Visit (HOSPITAL_COMMUNITY): Payer: Self-pay

## 2024-09-12 NOTE — Telephone Encounter (Signed)
 Confirmed appt for 11/20.

## 2024-09-13 ENCOUNTER — Other Ambulatory Visit: Payer: Self-pay

## 2024-09-13 ENCOUNTER — Ambulatory Visit: Attending: Primary Care | Admitting: Pharmacist

## 2024-09-13 DIAGNOSIS — Z7985 Long-term (current) use of injectable non-insulin antidiabetic drugs: Secondary | ICD-10-CM

## 2024-09-13 DIAGNOSIS — E1165 Type 2 diabetes mellitus with hyperglycemia: Secondary | ICD-10-CM | POA: Diagnosis not present

## 2024-09-13 DIAGNOSIS — Z7984 Long term (current) use of oral hypoglycemic drugs: Secondary | ICD-10-CM | POA: Diagnosis not present

## 2024-09-13 MED ORDER — SEMAGLUTIDE (1 MG/DOSE) 4 MG/3ML ~~LOC~~ SOPN
1.0000 mg | PEN_INJECTOR | SUBCUTANEOUS | 1 refills | Status: DC
  Filled 2024-09-13 – 2024-09-23 (×2): qty 3, 28d supply, fill #0
  Filled 2024-10-29: qty 3, 28d supply, fill #1

## 2024-09-13 NOTE — Progress Notes (Signed)
 S:     No chief complaint on file.  58 y.o. female who presents for diabetes evaluation, education, and management. Patient arrives in good spirits and presents without any assistance.    Patient was referred and last seen by Primary Care Provider, [name] Bohr, on 06/06/2024. A1c was last taken 07/12/24 and showed good glycemic control at 6.3%.   PMH is significant for T2DM, HTN, mitral valve regurgitation, non-obstructive cardiomyopathy/HFrEF (latest EF was 30-35% on Echo 02/29/24), COPD w/ hx of exacerbation (last hospitalization in 03/2024), lumbar radiculopathy, former tobacco use, insomnia.    I saw her on 08/13/24 and started Ozempic . She started this and endorses adherence to metformin  and Farxiga  daily. Denies any NV, abdominal pain. No changes in vision since adding Ozempic .  Family/Social History:  -Fhx: HTN, allergies, asthma, heart disease -Tobacco: former smoker (quit in 2020) -Alcohol: none reported  Current diabetes medications include: Farxiga  10 mg daily, metformin  500 mg XR daily, Ozempic  0.25 mg weekly.  Current hypertension medications include: carvedilol  3.125 mg BID, spironolactone  25 mg daily, valsartan  40 mg daily **also takes Lasix  40 mg daily for fluid control Current hyperlipidemia medications include: atorvastatin  40 mg daily   Patient reports adherence to taking all medications as prescribed.   Insurance coverage: [employer]  Patient denies hypoglycemic events.  Reported home fasting blood sugars: none reported   Reported 2 hour post-meal/random blood sugars: none reported.  Patient reports nocturia (nighttime urination).  Patient reports neuropathy (nerve pain). Patient denies visual changes. Patient reports self foot exams.   Patient reported dietary habits:  -Admits to snacking throughout the day and at bedtime   Patient-reported exercise habits:  -Limited by dyspnea, CHF hx  O:   Lab Results  Component Value Date   HGBA1C 6.3  07/12/2024   There were no vitals filed for this visit.  Lipid Panel     Component Value Date/Time   CHOL 146 07/12/2024 1036   TRIG 62 07/12/2024 1036   HDL 77 07/12/2024 1036   CHOLHDL 1.9 07/12/2024 1036   CHOLHDL 2.5 07/26/2023 0254   VLDL 9 07/26/2023 0254   LDLCALC 56 07/12/2024 1036    Clinical Atherosclerotic Cardiovascular Disease (ASCVD): No  The 10-year ASCVD risk score (Arnett DK, et al., 2019) is: 4.8%   Values used to calculate the score:     Age: 60 years     Clincally relevant sex: Female     Is Non-Hispanic African American: Yes     Diabetic: Yes     Tobacco smoker: No     Systolic Blood Pressure: 100 mmHg     Is BP treated: Yes     HDL Cholesterol: 77 mg/dL     Total Cholesterol: 146 mg/dL   Patient is participating in a Managed Medicaid Plan: No   A/P: Diabetes longstanding currently controlled with A1c at goal. Patient is is not symptomatic from a hypoglycemia standpoint but is able to verbalize appropriate hypoglycemia management plan. She does have some polyuria and longstanding neuropathy symptoms. However, her polyuria is also due to fluid intake and loop diuretic use. Additionally, she has no changes to her baseline neuropathy symptoms. Medication adherence appears to be appropriate. She is tolerating the Ozempic  well. We will increase to the next dose.  -Continued metformin  and Farxiga  for now. May remove metformin  and continue with Farxiga  + Ozempic  in the future should she develop any hypoglycemia.  -INCREASE Ozempic  to 0.5 mg weekly. Rxn sent for the 1 mg dose to start  once she completes 4 injections of the 0.5 mg dose. -Extensively discussed pathophysiology of diabetes, recommended lifestyle interventions, dietary effects on blood sugar control.  -Counseled on s/sx of and management of hypoglycemia.  -Next A1c anticipated 09/2024.  -Urine microalbumin to creatinine ratio  ASCVD risk - primary prevention in patient with diabetes. Last LDL is at  goal. high intensity statin indicated.  -Continued atorvastatin  40 mg.   Hypertension longstanding currently controlled with last OV BP at goal and several home goal readings via paramedicine. Blood pressure goal of <130/80 mmHg. Medication adherence reported. -Continued current regimen.  Written patient instructions provided. Patient verbalized understanding of treatment plan.  Total time in face to face counseling 45 minutes.    Follow-up:  Pharmacist in 1 month.  Herlene Fleeta Morris, PharmD, JAQUELINE, CPP Clinical Pharmacist [facility] [phone]

## 2024-09-15 LAB — MICROALBUMIN / CREATININE URINE RATIO
Creatinine, Urine: 35.5 mg/dL
Microalb/Creat Ratio: 107 mg/g{creat} — ABNORMAL HIGH (ref 0–29)
Microalbumin, Urine: 38 ug/mL

## 2024-09-16 ENCOUNTER — Ambulatory Visit (INDEPENDENT_AMBULATORY_CARE_PROVIDER_SITE_OTHER): Payer: Self-pay | Admitting: Primary Care

## 2024-09-17 ENCOUNTER — Encounter (HOSPITAL_COMMUNITY): Payer: Self-pay

## 2024-09-17 ENCOUNTER — Other Ambulatory Visit (HOSPITAL_COMMUNITY): Payer: Self-pay

## 2024-09-17 ENCOUNTER — Ambulatory Visit: Admitting: Internal Medicine

## 2024-09-17 DIAGNOSIS — J4540 Moderate persistent asthma, uncomplicated: Secondary | ICD-10-CM

## 2024-09-17 DIAGNOSIS — R058 Other specified cough: Secondary | ICD-10-CM

## 2024-09-17 NOTE — Progress Notes (Deleted)
 Subjective:    Patient ID: Stacey Lin, female    DOB: 07-10-1966   MRN: [id number]   Brief patient profile:  52 yobf  Active smoker previously seen by Dr. Corrie 12/18/14 with dyspnea on exertion.She was felt to have asthmatic bronchitis but not COPD  rec Stop spiriva  rx breo 100, take one inhalation each am proair  respiclick, 2 inhalations every 6 hrs only if you are having severe breathing issues. Work on weight loss and conditioning. Will send a note to your cardiologist to see about getting you off lisinopril  to see if this helps your dry tickling cough. You must stop smoking 100% in order to stay well.  followup with me again in 2mos to check on things> did not return.   History of Present Illness  02/12/2016: NP Follow up Office Visit: Patient presents to the office today with worsening dyspnea on exertion. She was seen at the heart failure clinic one day prior to OV   and they requested that she follow up with pulmonary. Spirometry 11/2014 showed  normal FEV1 with ? air trapping given her   FVC ? 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 She is continuing to smoke approx. 2 cigarettes daily per her history.    she has not been using her Breo inhaler daily as  Maintenance medication, but just when needed.   rec We will give you a Breo sample and new prescription for your Breo.( Maintenance inhaler) Use the Breo 1 puff twice daily. We will give you a prescription for your Pro Air Inhaler ( Rescue Inhaler) Use this every 6 hours as needed for SOB or Wheezing. Claritin ( loratadine) 1 every day for allergies Nasal Saline as needed for nasal congestion Continue weighing yourself daily per the heart failure clinic Follow up in 1 month > did  Not return        11/20/2018  Acute  ov/Stacey Lin re: worse since stopped symbicort   Chief Complaint  Patient presents with   Acute Visit    Increased SOB for the past 2 months. She gets winded walking from room to room at home. She also c/o  occ cough and wheezing. Her cough is mainly non prod.  She is using her albuterol  inhaler 2 x per wk on average.   Dyspnea:  Gradually worse x 2 months to point to doe  Room to room = MMRC3  Cough: acutely worse x 2 days / harsh esp daytime > noct  Sleeping: on back with 2 pillows SABA use: confused with which ones help and when to take  rec Plan A = Automatic = symbicort  80 Take 2 puffs first thing in am and then another 2 puffs about 12 hours later.  Work on inhaler technique  The key is to stop smoking completely before smoking completely stops you!  Plan B = Backup Only use your albuterol  (ventolin ) inhaler  Please schedule a follow up office visit in 2  weeks, call sooner if needed with all medications /inhalers/ solutions in hand so we can verify exactly what you are taking. This includes all medications from all doctors and over the counters - PLEASE separate them into two bags:  the ones you take automatically, no matter what, vs the ones you take just when you feel you need them BAG #2 is UP TO YOU  - this will really help us  help you take your medications more effectively.  - add: Prednisone  10 mg take  4 each am x 2 days,  2 each am x 2 days,  1 each am x 2 days and stop and spirometry on return        Admit date: 02/28/2024 Discharge date: 03/03/2024  Discharge Diagnoses:  Acute hypoxic respiratory failure Acute on chronic systolic CHF Pulmonary hypertension Mitral regurgitation COPD Polysubstance abuse Ongoing tobacco use Cocaine  use Type 2 diabetes mellitus Depression         03/05/2024  f/u ov/Stacey Lin re: severe cough always better in hospital    maint on ? Really not clear on her meds  - did not bring them   - has not been able to leave house comfortable xmas 2024  Chief Complaint  Patient presents with   Hospitalization Follow-up    COPD flair up.  Still having problems with breathing.  Dyspnea:  doe across the room  Cough: 24/7 assoc with nasal congestion   Sleeping: flat bed with 2 pillows  resp cc  SABA use: has not used neb yet on day of ov at 2 pm  02: none  Rec Depomedrol 120 mg IM  Guaifenesin  with codeine  1 tsp every 4 hours as needed  Stop losartan  and start valsartan  40 mg daily in its place Duoneb 4 x times a day as planned and also start your prednisone   Pantoprazole  (protonix ) 40 mg   Take  30-60 min before first meal of the day and Pepcid  (famotidine )  20 mg after supper until return to office   GERD diet reviewed, bed blocks rec  Please schedule a follow up office visit in 4 weeks, sooner if needed  with all medications /inhalers/ solutions in hand   Cxr CM/ o/w ok      07/24/2024  f/u ov/Stacey Lin re: severe cough always better in hospital  quit smoking Aug 2024   maint on BREO  did not  bring meds  order  LCS Chief Complaint  Patient presents with   Medication Management   Bronchitis    Breathing is unchanged since last visit   Dyspnea:  10 ft  (125 ft here with no desats) Cough: dry day >> noct  Sleeping: flat bed / 2 pillows s resp cc  SABA use: not sure  02: none Lung cancer screening :  referred   Patient Instructions  Stop BREO  and start albuterol  2.5 mg and add  budesonide  0.25mg   twice daily  Pantoprazole  40 mg Take 30-60 min before first meal of the day and continue pepcid  20 mg after supper  Gabapentn 300 mg twice daily - not as needed Delsym  2 tsp twice daily as needed or cough  Depomedrol 120 mg IM  No mint menthol  or chocolate  LCS > not done as of 09/17/2024  Please schedule a follow up office visit in 4 weeks, sooner if needed  with all medications /inhalers/ solutions in hand         09/17/2024  f/u ov/Stacey Lin re: ***   maint on ***  needs LCS referral ***    did *** bring meds  *** smoker No chief complaint on file.   Dyspnea:  *** Cough: *** Sleeping: *** resp cc  SABA use: *** 02: ***  Lung cancer screening :  ***    No obvious day to day or daytime variability or assoc excess/ purulent  sputum or mucus plugs or hemoptysis or cp or chest tightness, subjective wheeze or overt sinus or hb symptoms.    Also denies any obvious fluctuation of symptoms with weather or environmental changes or  other aggravating or alleviating factors except as outlined above   No unusual exposure hx or h/o childhood pna/ asthma or knowledge of premature birth.  Current Allergies, Complete Past Medical History, Past Surgical History, Family History, and Social History were reviewed in [employer] record.  ROS  The following are not active complaints unless bolded Hoarseness, sore throat, dysphagia, dental problems, itching, sneezing,  nasal congestion or discharge of excess mucus or purulent secretions, ear ache,   fever, chills, sweats, unintended wt loss or wt gain, classically pleuritic or exertional cp,  orthopnea pnd or arm/hand swelling  or leg swelling, presyncope, palpitations, abdominal pain, anorexia, nausea, vomiting, diarrhea  or change in bowel habits or change in bladder habits, change in stools or change in urine, dysuria, hematuria,  rash, arthralgias, visual complaints, headache, numbness, weakness or ataxia or problems with walking or coordination,  change in mood or  memory.        No outpatient medications have been marked as taking for the 09/17/24 encounter (Appointment) with Darlean Ozell NOVAK, MD.                Objective:   Physical Exam   wts  09/17/2024        ***  07/24/2024        173  03/05/2024        172   03/05/2021        173 09/02/2019        200  08/16/2019      192 12/19/2018        202  12/04/2018        205   11/20/18 201 lb 6.4 oz (91.4 kg)  10/30/18 197 lb (89.4 kg)  09/17/18 198 lb (89.8 kg)      Vital signs reviewed  09/17/2024  - Note at rest 02 sats  ***% on ***   General appearance:    ***     trace bilateral pitting LE edema***             Assessment & Plan:

## 2024-09-18 ENCOUNTER — Telehealth (HOSPITAL_COMMUNITY): Payer: Self-pay | Admitting: Emergency Medicine

## 2024-09-18 NOTE — Telephone Encounter (Signed)
 Spoke with Ms. Wuellner this morning at 8:49 to confirm today's home visit @ 10:00.  She advised that she was in the hospital.  She was transported to [facility] for respiratory distress and being treated for acute COPD exacerbation.  I advised her that I will reach out to her the first of the week to check on her status. She agrees to same.    Mary Sharps, EMT-Paramedic [phone] 09/18/2024

## 2024-09-19 NOTE — Discharge Summary (Signed)
 Hospital Medicine Discharge Summary   Demographics: Stacey Lin  58 y.o. 01-16-1966 MRN: [id number]    Extended Emergency Contact Information Primary Emergency Contact: Stacey Lin,Stacey Lin Mobile Phone: [phone] Relation: Son  Full Code  Admit Date: 09/16/2024                            Attending Physician: Jama Rome Louder, MD Discharge Date: 09/19/2024  Primary Care Provider: [name] JOHNIE BOHR, NP   [phone]  Consults during this admission: Consult Orders             IP CONSULT TO HOSPITALIST       Provider:  (Not yet assigned)             Active & Resolved Diagnosis: Principal Problem:   COPD with acute exacerbation    (CMD) Active Problems:   Respiratory distress   Community acquired pneumonia   Pulmonary edema (CMD)   Diabetes mellitus (CMD)   Primary hypertension   Gram-negative bacteremia Resolved Problems:   * No resolved hospital problems. *  Disposition: Patient discharged to home   History of present illness taken from history and physical dictated 46/76: 58 year old female with the above history presenting with shortness of breath.  She describes 1 day duration of shortness of breath though is a very limited historian.  Reportedly had subjective fever as well as cough, nonproductive.  On EMS arrival required CPAP, briefly on BiPAP while in ED has since been subsequently weaned down to 2 L and a nasal cannula saturating in the low to mid 90s   Workup reveals low-grade temperature, leukocytosis, imaging concerning for heart failure  Hospital Course: COPD with acute exacerbation    (CMD) Due to tachypnea and hypoxemia, patient briefly required noninvasive ventilation.  She was transitioned to supplemental oxygen via nasal cannula and with continued treatment, she is currently tolerating room air and maintaining oxygen saturation of 94 to 100% at rest and with exertion.  Patient's hypoxemia is likely multifactorial including pulmonary  edema, COPD and pneumonia.  Continue to address underlying etiologies.  Acute COPD exacerbation was treated with scheduled and as needed breathing treatments, antibiotic, mucolytic, steroid and supplemental oxygen.  Patient will complete steroid taper and antibiotic course following discharge from the hospital.  Supplemental oxygen was able to be weaned and discontinued during hospitalization  Gram-negative bacteremia Blood cultures 2 out of 2 positive for gram-negative bacteremia.  Rapid blood culture identification identified Enterobacterales and E. coli.  Culture results demonstrate sensitivity to flora quinolone and patient will complete antibiotic course.  Leukocytosis has resolved and patient has been afebrile for 24 hours at time of discharge  Respiratory distress Improving with current treatment.  Community acquired pneumonia The patient has evidence of ongoing pneumonia based on images showing airspace disease  with fever greater than 100.4 F  and 2 or more additional findings of new onset or worsening cough, or dyspnea, or tachycardia .  Patient will be started on the clinically indicated antibiotics and monitored for improvement during the course of treatment for her other disease processes.  As noted above, patient is completing antibiotic  Pulmonary edema (CMD) Will have patient continue scheduled Lasix  for 7 days and then return to as needed use only.  She will continue daily spironolactone  use echocardiogram obtained on 11/24 demonstrated normal ejection fraction mild to moderate valvular heart disease with no pericardial effusion.  Left ventricular wall motion is normal  Diabetes mellitus (CMD) Hemoglobin A1c is 6.5  representing adequate glycemic control prior to admission.  During last 24 hours, patient's blood glucose ranging from 151-312.  Likely, steroid use is negatively impacting glycemic control.  As noted above, patient is completing steroid taper with 3 additional days of  steroids following discharge from the hospital and we anticipate blood glucose will return to patient's baseline following completion of steroid use  Primary hypertension During last 24 hours, patient blood pressure ranged from 94/47-118/65.  Losartan  was held due to borderline blood pressure.  Antihypertensive medications will be adjusted by primary care physician as indicated         Wound / Incision Assessment: Refer to Chart Review and Media Tab for images if available.     Vital Sign Range:  Temp:  [97.5 F (36.4 C)-98.6 F (37 C)] 98.1 F (36.7 C) Heart Rate:  [64-78] 65 Resp:  [17-20] 20 BP: (94-118)/(47-69) 104/59  General appearance -awake and alert.  Appears comfortable with no acute distress Mental status - answering questions appropriately, mood appropriate for situation Eyes - pupils equal and reactive, extraocular eye movements intact Mouth - mucous membranes moist, pharynx normal without lesions Chest -bilaterally clear to auscultation Heart - normal rate, regular rhythm, normal S1, S2, no murmurs, rubs, clicks or gallops Abdomen - soft, nontender, nondistended, no masses or organomegaly      Discharge Medications     New Medications      Sig Disp Refill Start End  levoFLOXacin  500 mg tablet Commonly known as: LEVAQUIN   Take 1 tablet (500 mg total) by mouth daily for 10 days.  10 tablet  0     predniSONE  20 mg tablet Commonly known as: DELTASONE   Take 1 tablet (20 mg total) by mouth daily for 3 days.  3 tablet  0         Modified Medications      Sig Disp Refill Start End  * furosemide  40 mg tablet Commonly known as: LASIX  What changed: Another medication with the same name was added. Make sure you understand how and when to take each.  Take 40 mg by mouth daily as needed.   0     * furosemide  20 mg tablet Commonly known as: LASIX  What changed: You were already taking a medication with the same name, and this prescription was added. Make  sure you understand how and when to take each.  Take 1 tablet (20 mg total) by mouth daily for 7 days.  7 tablet  0        * * There are duplicate medications prescribed to the patient          Medications To Continue      Sig Disp Refill Start End  albuterol  HFA 90 mcg/actuation inhaler Commonly known as: PROVENTIL  HFA;VENTOLIN  HFA;PROAIR  HFA  Inhale 2 puffs every 4 (four) hours as needed for wheezing.   0     allopurinoL  300 mg tablet Commonly known as: ZYLOPRIM   Take 300 mg by mouth 2 (two) times a day.   0     atorvastatin  40 mg tablet Commonly known as: LIPITOR  Take 40 mg by mouth daily.   0     budesonide  0.25 mg/2 mL nebulizer solution Commonly known as: PULMICORT   Take 0.25 mg by nebulization 2 (two) times a day.   0     busPIRone  5 mg tablet Commonly known as: BUSPAR   Take 5 mg by mouth 2 (two) times a day.   0     carvediloL  3.125  mg tablet Commonly known as: COREG   Take 3.125 mg by mouth in the morning and 3.125 mg in the evening. Take with meals.   0     dapagliflozin  propanediol 10 mg Tab tablet Commonly known as: FARXIGA   Take 10 mg by mouth daily.   0     famotidine  20 mg tablet Commonly known as: PEPCID   Take 20 mg by mouth every evening.   0     fluticasone  furoate-vilanteroL 100-25 mcg/dose inhaler Commonly known as: BREO ELLIPTA   Inhale 1 puff daily.   0     gabapentin  300 mg capsule Commonly known as: NEURONTIN   Take 300 mg by mouth 2 (two) times a day.   0     ipratropium-albuteroL  0.5-2.5 mg/3 mL nebulizer solution Commonly known as: DUO-NEB  Take 3 mL by nebulization 2 (two) times a day.   0     metFORMIN  500 mg 24 hr tablet Commonly known as: GLUCOPHAGE -XR  Take 500 mg by mouth daily with breakfast.   0     oxyCODONE -acetaminophen  10-325 mg per tablet Commonly known as: PERCOCET  Take 1 tablet by mouth every 8 (eight) hours as needed.   0     pantoprazole  40 mg EC tablet Commonly known as: PROTONIX   Take 40 mg  by mouth every morning before breakfast.   0     QUEtiapine  100 mg tablet Commonly known as: SEROquel   Take 100 mg by mouth at bedtime.   0     semaglutide  1 mg/dose (4 mg/3 mL) subcutaneous pen injector Commonly known as: OZEMPIC   Inject 1 mg under the skin every Wednesday.   0     spironolactone  25 mg tablet Commonly known as: ALDACTONE   Take 25 mg by mouth daily.   0         Stopped Medications    valsartan  40 mg tablet Commonly known as: DIOVAN        Discharge Orders     Ambulatory referral to PCP     Full Code     Lifting Limits:     Details:    Lifting Limits: No lifting limits   Regular diet     Details:    Diet type: Mediterranean Sodium Controlled (less than 2000 mg)   Walker     Details:    Height (in inches): 1.524 m (5')   Weight (in lbs.): 81.7 kg (180 lb 1.9 oz)   Walker Options: Rolling   Length of need: Lifetime         Lab Results  Component Value Date/Time   HGB 10.6 (L) 09/19/2024 08:18 AM   HCT 31.7 (L) 09/19/2024 08:18 AM   WBC 10.97 09/19/2024 08:18 AM   PLT 200 09/19/2024 08:18 AM   Lab Results  Component Value Date/Time   NA 137 09/18/2024 07:43 AM   K 4.1 09/18/2024 07:43 AM   CREATININE 1.17 09/18/2024 07:43 AM   BUN 26 (H) 09/18/2024 07:43 AM   GLUCOSE 151 (H) 09/18/2024 07:43 AM    Pertinent Imaging: Transthoracic echo (TTE) complete  Final Result by Gerhardt Mana, MD (11/25 1205)  Version  1                                                                                                          Study ID  [id number]                                                                     +--------------------------------------------------+                             +----------+                                                                                                                                                     [facility]                                                                                                                                 [facility]                                         +--------------------------------------------------+                                                                                                                                                   +----------+  [facility]  [address]  Transthoracic Echocardiogram Report  Name  HILJA, KINTZEL                             Study Date  09-17-2024,   10  12 AM                  Height  60 in  MRN  [id number]                                      Patient Location    [facility]                       Weight  180.118 lb  DOB  [birth year]  [id number]                        Birth Gender  Female                                 BSA  1.79 m  Age  14 Years                                       Ethnicity  3                                        BP  99 - 49 mmHg  Reason For Study  Heart failure suspected  Ped 0-17y   History  CHF  Ordering Physician  HOWER, DAVID KEMP  Performed By  JDK  Referring Physician  HOWER, DAVID KEMP  PROCEDURE  A two-dimensional transthoracic echocardiogram with color flow and Doppler   was performed. Image  Quality  Technically adequate.  Normal LV size, wall thickness, wall motion and systolic function with   ejection fraction 55-60%  The right ventricle is normal size.  The right ventricular systolic function is normal.  There is moderate aortic regurgitation.  There is mild mitral regurgitation.  There is mild tricuspid regurgitation.  IVC size was mildly dilated.  There is no pericardial effusion.  The aortic root is normal.  LEFT VENTRICLE  Normal LV size, wall thickness, wall motion and systolic function with   ejection fraction 55-60%.  RIGHT VENTRICLE  The right ventricle is normal size. The right  ventricular systolic   function is normal.  LEFT ATRIUM  The left atrial size is normal.  RIGHT ATRIUM  Right atrial size is normal.  AORTIC VALVE  The aortic valve is trileaflet. There is aortic valve sclerosis. There is   moderate aortic  regurgitation. There is no aortic stenosis.  MITRAL VALVE  The mitral valve leaflets appear normal. There is mild mitral   regurgitation.  TRICUSPID VALVE  Structurally normal tricuspid valve. There is mild tricuspid   regurgitation. Estimated right  ventricular systolic pressure is 25 mmHg.  PULMONIC VALVE  The pulmonic valve is not well visualized. There is no pulmonic valvular   stenosis. Trace pulmonic  valvular regurgitation.  ARTERIES  The aortic root is normal.  VENOUS  IVC size  was mildly dilated.  EFFUSION  There is no pericardial effusion.  MMode-2D Measurements & Calculations  EDV MOD-sp4   89.4 ml                 EF A4C  51.8 %                           ESV MOD-sp4   43.1 ml    IVSd  1.03 cm  LA dim  4.1 cm                         LA ESV  BP   77.5 ml                    LA ESV Index  A2C    44.6 ml-m        LA ESV Index  A4C   40.8 ml-m  LA ESV Index  BP   43.3 ml-m         LVIDd  5.0 cm                            LVIDs  3.4 cm            LVOT diam  1.77 cm  LVPWd  1.17 cm                        SV A4C  46.3 ml  Doppler Measurements & Calculations  AI dec slope  300.0 cm-sec           AI max PG  95.7 mmHg                     AI max vel  488.3  cm-sec               Ao max PG  19.5 mmHg  Ao V2 max  220.6 cm-sec                E-Lat E  12.3                          E-Med E  15.1             Lat Peak E  Vel  8.2 cm-sec  Med Peak E  Vel  6.6 cm-sec           MV A max vel  163.6 cm-sec               MV E max vel  100.0  cm-sec  Other Measurements & Calculations  AI P1-2t  476.7 msec                  BSA  1.79 m                            MV E-A  0.61              SI MOD-sp4   25.9 ml-m  SV MOD-sp4   46.3 ml   ___________________________________________________________________________  ___  MD Gerhardt Mana, MD, [phone]         09-17-2024, 12  05 PM    XR Chest 1 View  Final Result by Lawton Cinderella Fila, MD (11/24 1848)  Date of Service: 2024-09-16 18:34:00    EXAM:  XR Chest, 1  View.    CLINICAL HISTORY:  SHOB.     COMPARISON:  None provided.      FINDINGS:    LUNGS AND PLEURA:  Pulmonary edema. No pleural effusion pneumothorax is seen.     HEART AND MEDIASTINUM:  Generalized cardiomegaly.     BONES:  No acute osseous abnormality.    IMPRESSION:    Pulmonary edema identified    Electronically signed by: Lawton Fila MD on 09/16/2024 06:48:31 PM   US [place]      Predictive Model Details        15.9% (Medium)  Factor Value   Calculated 09/19/2024 08:08 8% Latest RDW in last 72 hrs 13.7 %   Readmission Risk Score v2 Model 8% Number of hospitalizations in last year 0    8% Latest BUN in last 72 hrs 26 mg/dL    6% Number of active outpatient medication orders 19    6% Braden score 19     Electronically signed by: Jama Rome Louder, MD 09/19/2024 9:09 AM   Time spent on discharge: 35 minutes *Some images could not be shown.

## 2024-09-19 NOTE — Progress Notes (Signed)
 Case Management Discharge Note        CSN: [id number] DOB: [birth year] Service: General Medicine Location: 516/01  Patient Class: Inpatient  DC Disposition: : Home or Self Care  Discharge DC Disposition: : Home or Self Care DME: Rolling Walker Patient DME Agency: [facility]  Discharge Referrals Patient Preference: Chosen geographical local area/county shared with patient/family: Return/previous involvement Patient Preference for Post-Acute Provider Form completed: Return/Previous Involvement Case closed, patient/family agree with disposition plan: Yes     Durable Medical Equipment Coordination Status: Coordination complete.    Case Management Coordination Status: Coordination Complete     Aleck Benders MSW, LCSW-A  [phone]

## 2024-09-20 ENCOUNTER — Telehealth: Payer: Self-pay | Admitting: *Deleted

## 2024-09-20 NOTE — Transitions of Care (Post Inpatient/ED Visit) (Signed)
 09/20/2024  Name: Stacey Lin MRN: [id number] DOB: 01/01/1966  Today's TOC FU Call Status: Today's TOC FU Call Status:: Successful TOC FU Call Completed TOC FU Call Complete Date: 09/20/24  Patient's Name and Date of Birth confirmed. Name, DOB  Transition Care Management Follow-up Telephone Call Date of Discharge: 09/19/24 Discharge Facility: Other [facility]) Name of Other (Non-[facility]) Discharge Facility: [facility] Type of Discharge: Inpatient Admission Primary Inpatient Discharge Diagnosis:: COPD with acute exacerbation How have you been since you were released from the hospital?: Worse (Patient is having chest pain since last night with some SOB.) Any questions or concerns?: Yes Patient Questions/Concerns:: Patient does not have her medications. Patient Questions/Concerns Addressed: Other: (RNCM educated patient on having medications transferred to a local pharmacy)  Items Reviewed: Did you receive and understand the discharge instructions provided?: Yes Medications obtained,verified, and reconciled?: No Medications Not Reviewed Reasons:: Other: Do you have support at home?: Yes People in Home [RPT]: child(ren), adult Name of Support/Comfort Primary Source: I have plenty of support Son and DIL  Medications Reviewed Today: Medications Reviewed Today     Reviewed by Lucky Andrea LABOR, RN (Registered Nurse) on 09/20/24 at 1029  Med List Status: <None>   Medication Order Taking? Sig Documenting Provider Last Dose Status Informant  Accu-Chek Softclix Lancets lancets [id number]  Use as instructed. Check Blood sugars daily before breakfast Vicci Barnie NOVAK, MD  Active   albuterol  (VENTOLIN  HFA) 108 (90 Base) MCG/ACT inhaler [id number] Yes INHALE 2 PUFFS INTO THE LUNGS EVERY 4 HOURS AS NEEDED FOR WHEEZING OR SHORTNESS OF SHERIDA Celestia [name] SHAUNNA, NP  Active   allopurinol  (ZYLOPRIM ) 300 MG tablet [id number] Yes Take 300 mg by mouth 2 (two) times daily. [provider]  Active   ammonium lactate  (LAC-HYDRIN ) 12 % lotion [id number] Yes Apply 1 Application topically as needed for dry skin. Gaynel Delon CROME, DPM  Active Self, Pharmacy Records  atorvastatin  (LIPITOR) 40 MG tablet [id number] Yes Take 1 tablet (40 mg total) by mouth daily. Newlin, Enobong, MD  Active   Blood Glucose Monitoring Suppl (ACCU-CHEK GUIDE) w/Device KIT [id number]  Check Blood sugars daily before breakfast Vicci Barnie NOVAK, MD  Active   budesonide  (PULMICORT ) 0.25 MG/2ML nebulizer solution [id number] Yes One vial twice daily with albuterol  in nebulizer Darlean Ozell NOVAK, MD  Active   busPIRone  (BUSPAR ) 5 MG tablet [id number] Yes Take 1 tablet (5 mg total) by mouth 2 (two) times daily. Celestia [name] SHAUNNA, NP  Active   carvedilol  (COREG ) 3.125 MG tablet [id number] Yes Take 1 tablet (3.125 mg total) by mouth 2 (two) times daily with a meal. Bensimhon, Toribio SAUNDERS, MD  Active   dapagliflozin  propanediol (FARXIGA ) 10 MG TABS tablet [id number] Yes Take 1 tablet (10 mg total) by mouth daily. Celestia [name] SHAUNNA, NP  Active   famotidine  (PEPCID ) 20 MG tablet [id number] Yes One after supper Newlin, Enobong, MD  Active   fluticasone  furoate-vilanterol (BREO ELLIPTA ) 100-25 MCG/ACT AEPB [id number] Yes Inhale 1 puff into the lungs daily. Celestia [name] SHAUNNA, NP  Active   furosemide  (LASIX ) 40 MG tablet [id number] Yes Take 1 tablet (40 mg total) by mouth daily. May take an additional 40 mg as needed  Patient taking differently: Take 20 mg by mouth daily. Taking 20mg  daily for 7 days   Hayes Beckey CROME, NP  Active   gabapentin  (NEURONTIN ) 300 MG capsule [id number] Yes Take 1 capsule (300 mg total) by mouth 2 (two) times daily. Newlin, Enobong, MD  Active  glucose blood (ACCU-CHEK GUIDE TEST) test strip [id number]  Use as instructed. Check Blood sugars daily before breakfast Vicci Barnie NOVAK, MD  Active   ipratropium-albuterol  (DUONEB) 0.5-2.5 (3) MG/3ML SOLN [id number] Yes Take 3 mLs by nebulization 2 (two)  times daily. Fairy Frames, MD  Active Self, Pharmacy Records  metFORMIN  (GLUCOPHAGE -XR) 500 MG 24 hr tablet [id number] Yes Take 1 tablet (500 mg total) by mouth daily. Newlin, Enobong, MD  Active   naloxone  (NARCAN ) nasal spray 4 mg/0.1 mL [id number] Yes Inject intranasal for overdose  Patient taking differently: Place 1 spray into the nose as needed. Inject intranasal for overdose   Yolande Lamar BROCKS, MD  Active Self, Pharmacy Records           Med Note [place], JEANNINE CHRISTELLA Schaumann Sep 05, 2024 11:36 AM) As needed  pantoprazole  (PROTONIX ) 40 MG tablet [id number] Yes TAKE 1 TABLET(40 MG) BY MOUTH DAILY 30 TO 60 MINUTES BEFORE FIRST MEAL OF THE DAY Wert, Michael B, MD  Active   QUEtiapine  (SEROQUEL ) 100 MG tablet [id number] Yes Take 1 tablet (100 mg total) by mouth at bedtime. Celestia [name] SQUIBB, NP  Active   Semaglutide , 1 MG/DOSE, 4 MG/3ML SOPN [id number] Yes Inject 1 mg as directed once a week. Start after 4 injections of the 0.5mg  dose. Newlin, Enobong, MD  Active   Semaglutide ,0.25 or 0.5MG /DOS, (OZEMPIC , 0.25 OR 0.5 MG/DOSE,) 2 MG/3ML SOPN [id number]  Inject 0.5 mg into the skin once a week.  Patient not taking: Reported on 09/20/2024   Newlin, Enobong, MD  Active            Med Note ANNELL, JEANNINE CHRISTELLA Schaumann Sep 05, 2024 11:38 AM) Patient takes on Wednesday.  spironolactone  (ALDACTONE ) 25 MG tablet [id number] Yes Take 1 tablet (25 mg total) by mouth daily. Wedderburn, Harlene CHRISTELLA, [place]  Active   traZODone  (DESYREL ) 50 MG tablet [id number] Yes Take 1-2 tablets (50-100 mg total) by mouth at bedtime as needed for sleep. Theotis Haze ORN, NP  Active   valsartan  (DIOVAN ) 40 MG tablet [id number] Yes Take 1 tablet (40 mg total) by mouth daily. Celestia [name] SQUIBB, NP  Active   Med List Note Steffi, Bronson, CPhT 02/29/24 1343): Polysubstance use disorder - cocaine , history of non - compliance             Home Care and Equipment/Supplies: Were Home Health Services Ordered?: No Any new equipment or  medical supplies ordered?: No  Functional Questionnaire: Do you need assistance with bathing/showering or dressing?: Yes (NA assists with bathing and dressing) Do you need assistance with meal preparation?: Yes Do you need assistance with eating?: No Do you have difficulty maintaining continence: No Do you need assistance with getting out of bed/getting out of a chair/moving?: No Do you have difficulty managing or taking your medications?: Yes (Paramedicine manages medications)  Follow up appointments reviewed: PCP Follow-up appointment confirmed?: Yes Date of PCP follow-up appointment?: 10/11/24 Follow-up Provider: [name] Celestia Do you understand care options if your condition(s) worsen?: Yes-patient verbalized understanding  Ms.Dupin left her walker in room 516 at [facility]. RNCM contacted [facility] to locate walker. The walker was located and will be saved on [address] [facility]. During the call patient decided to contact 911 due to chest pain and SOB. Patient's family notified of walker and will pick it up. RNCM was unable to complete TOC Assessment.  Andrea Dimes RN, BSN [place]  [facility] RN Care Manager [phone]

## 2024-09-23 ENCOUNTER — Other Ambulatory Visit: Payer: Self-pay

## 2024-09-24 ENCOUNTER — Other Ambulatory Visit (HOSPITAL_COMMUNITY): Payer: Self-pay

## 2024-09-24 ENCOUNTER — Other Ambulatory Visit: Payer: Self-pay

## 2024-09-24 ENCOUNTER — Other Ambulatory Visit (HOSPITAL_COMMUNITY): Payer: Self-pay | Admitting: Emergency Medicine

## 2024-09-24 NOTE — Progress Notes (Signed)
 Paramedicine Encounter    Patient ID: Stacey Lin, female    DOB: [birth year], 58 y.o.   MRN: [id number]   Complaints Baseline exertional SOB  Assessment A&O x 4, skin W&D w/ good color.  Lung sounds clear bilat. No edema noted.    Compliance with meds No- has been in hospital for acute COPD exacerbation  Pill box filled x 1  week  Refills needed Gabapentin , Allopurinol , Farxiga , Furosemide  Moving meds to [facility] on [address]  Meds changes since last visit Furosemide  20mg  x 7 days    Social changes NONE   BP 120/76 (BP Location: Right Arm, Patient Position: Standing)   Pulse (!) 110   Resp 18   Wt 182 lb (82.6 kg)   LMP  (LMP Unknown)   SpO2 98%   BMI 35.54 kg/m  Weight yesterday- Last visit weight- 177lb on 09/11/24 Paramedicine Home visit  Pt seem today following recent hospitalization due to acute COPD exacerbation.  Pt advises that she is feeling better and denies chest pain.  She does have baseline exertional SOB.  During visit she had to go down to the basement and feed the dogs and take them outside and when she came back up stairs she was obviously SOB but denies chest pain w/ same.  Pulse was elevated but was coming down up rest. She had tried to do her own pill box and I reviewed for accuracy. Found a few pills out of place so I deconstructed pill box and put it back together for accuracy.   Pt advises she would like to start picking up her meds at the [facility] on [address] which is significantly closer to her current residence in [place].  Refills called in and will be ready tomorrow. Made pt follow up visit w/ H&V clinic.  She was discharged from [facility] w/ instructions to take 20mg . Furosemide  daily x 7 days.  Pill box reconciled to reflect same.  Pt had 40mg  tablets on hand so box reconciled w/ 1/2 tablet daily x 7 days.  ACTION: Home visit completed  Stacey Lin [phone] 09/24/24  Patient Care Team: Stacey [name] SQUIBB, NP as PCP - General (Internal Medicine)  Patient Active Problem List   Diagnosis Date Noted   Low blood pressure, not hypotension (required pausing some of her GDMT) 04/18/2024   Pruritic intertrigo 04/18/2024   Hot flashes due to menopause 04/18/2024   Constipation 04/18/2024   Acute respiratory distress 04/05/2024   Lumbar radiculopathy 03/13/2024   CHF (congestive heart failure) (HCC) 02/29/2024   COPD with acute exacerbation (HCC) 02/28/2024   Respiratory failure with hypoxia (HCC) 11/21/2023   COPD exacerbation (HCC) 07/25/2023   Nonrheumatic aortic valve insufficiency    Elevated LFTs 04/08/2022   Acute metabolic encephalopathy 04/08/2022   Depressed mood 04/08/2022   Headache 04/07/2022   Obesity (BMI 30-39.9) 04/07/2022   Acute exacerbation of CHF (congestive heart failure) (HCC) 04/06/2022   Polysubstance abuse (HCC) 04/06/2022   Elevated troponin 04/06/2022   Controlled type 2 diabetes mellitus with hyperglycemia (HCC) 04/06/2022   Transaminitis 04/06/2022   Hypoalbuminemia 04/06/2022   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/11/2021   Upper airway cough syndrome 12/20/2018   Cigarette smoker 11/21/2018   Asthmatic bronchitis, moderate persistent, uncomplicated 11/20/2018   DOE (dyspnea on exertion) 04/14/2016   Streptococcus pneumoniae pneumonia 04/07/2016   Acute respiratory failure with hypoxia (HCC) 04/03/2016   Elevated lactic acid level 04/03/2016   Leukocytosis 04/03/2016   New onset type 2  diabetes mellitus (HCC) 04/03/2016   Abnormal urinalysis 04/03/2016   Trichomonas infection 04/03/2016   Non compliance w medication regimen 01/12/2016   COPD without exacerbation (HCC) 12/04/2014   AKI (acute kidney injury) 03/14/2014   Acute on chronic systolic heart failure (HCC) 03/14/2014   Musculoskeletal chest pain 03/14/2014   Dyspnea 12/03/2013   Periodic health assessment, general screening, adult 10/03/2013   Mitral valve regurgitation 08/22/2013    FIBROIDS, UTERUS 05/11/2010   ANEMIA, SECONDARY TO BLOOD LOSS 05/11/2010   Asthmatic bronchitis 05/11/2010   TOBACCO USER 07/07/2009   Essential hypertension, benign 06/19/2009   MENORRHAGIA 06/19/2009   COCAINE  ABUSE, HX OF 06/19/2009    Current Outpatient Medications:    allopurinol  (ZYLOPRIM ) 300 MG tablet, Take 300 mg by mouth 2 (two) times daily., Disp: , Rfl:    atorvastatin  (LIPITOR) 40 MG tablet, Take 1 tablet (40 mg total) by mouth daily., Disp: 90 tablet, Rfl: 1   busPIRone  (BUSPAR ) 5 MG tablet, Take 1 tablet (5 mg total) by mouth 2 (two) times daily., Disp: 120 tablet, Rfl: 1   carvedilol  (COREG ) 3.125 MG tablet, Take 1 tablet (3.125 mg total) by mouth 2 (two) times daily with a meal., Disp: 60 tablet, Rfl: 5   dapagliflozin  propanediol (FARXIGA ) 10 MG TABS tablet, Take 1 tablet (10 mg total) by mouth daily., Disp: 90 tablet, Rfl: 1   famotidine  (PEPCID ) 20 MG tablet, One after supper, Disp: 90 tablet, Rfl: 1   furosemide  (LASIX ) 40 MG tablet, Take 1 tablet (40 mg total) by mouth daily. May take an additional 40 mg as needed (Patient taking differently: Take 20 mg by mouth daily. Taking 20mg  daily for 7 days), Disp: 45 tablet, Rfl: 3   gabapentin  (NEURONTIN ) 300 MG capsule, Take 1 capsule (300 mg total) by mouth 2 (two) times daily., Disp: 60 capsule, Rfl: 6   metFORMIN  (GLUCOPHAGE -XR) 500 MG 24 hr tablet, Take 1 tablet (500 mg total) by mouth daily., Disp: 90 tablet, Rfl: 1   naloxone  (NARCAN ) nasal spray 4 mg/0.1 mL, Inject intranasal for overdose (Patient taking differently: Place 1 spray into the nose as needed. Inject intranasal for overdose), Disp: 1 each, Rfl: 1   pantoprazole  (PROTONIX ) 40 MG tablet, TAKE 1 TABLET(40 MG) BY MOUTH DAILY 30 TO 60 MINUTES BEFORE FIRST MEAL OF THE DAY, Disp: 90 tablet, Rfl: 2   QUEtiapine  (SEROQUEL ) 100 MG tablet, Take 1 tablet (100 mg total) by mouth at bedtime., Disp: 90 tablet, Rfl: 1   spironolactone  (ALDACTONE ) 25 MG tablet, Take 1 tablet  (25 mg total) by mouth daily., Disp: 45 tablet, Rfl: 3   traZODone  (DESYREL ) 50 MG tablet, Take 1-2 tablets (50-100 mg total) by mouth at bedtime as needed for sleep., Disp: 60 tablet, Rfl: 0   valsartan  (DIOVAN ) 40 MG tablet, Take 1 tablet (40 mg total) by mouth daily., Disp: 90 tablet, Rfl: 1   Accu-Chek Softclix Lancets lancets, Use as instructed. Check Blood sugars daily before breakfast, Disp: 100 each, Rfl: 12   albuterol  (VENTOLIN  HFA) 108 (90 Base) MCG/ACT inhaler, INHALE 2 PUFFS INTO THE LUNGS EVERY 4 HOURS AS NEEDED FOR WHEEZING OR SHORTNESS OF BREATH, Disp: 17 g, Rfl: 1   ammonium lactate  (LAC-HYDRIN ) 12 % lotion, Apply 1 Application topically as needed for dry skin., Disp: 400 g, Rfl: 5   Blood Glucose Monitoring Suppl (ACCU-CHEK GUIDE) w/Device KIT, Check Blood sugars daily before breakfast, Disp: 1 kit, Rfl: 0   budesonide  (PULMICORT ) 0.25 MG/2ML nebulizer solution, One vial twice daily  with albuterol  in nebulizer, Disp: 120 mL, Rfl: 12   fluticasone  furoate-vilanterol (BREO ELLIPTA ) 100-25 MCG/ACT AEPB, Inhale 1 puff into the lungs daily., Disp: 1 each, Rfl: 11   glucose blood (ACCU-CHEK GUIDE TEST) test strip, Use as instructed. Check Blood sugars daily before breakfast, Disp: 100 each, Rfl: 12   ipratropium-albuterol  (DUONEB) 0.5-2.5 (3) MG/3ML SOLN, Take 3 mLs by nebulization 2 (two) times daily., Disp: 180 mL, Rfl: 1   Semaglutide , 1 MG/DOSE, 4 MG/3ML SOPN, Inject 1 mg as directed once a week. Start after 4 injections of the 0.5mg  dose., Disp: 3 mL, Rfl: 1   Semaglutide ,0.25 or 0.5MG /DOS, (OZEMPIC , 0.25 OR 0.5 MG/DOSE,) 2 MG/3ML SOPN, Inject 0.5 mg into the skin once a week. (Patient not taking: Reported on 09/20/2024), Disp: 3 mL, Rfl: 1 No Known Allergies   Social History   Socioeconomic History   Marital status: Divorced    Spouse name: Not on file   Number of children: Not on file   Years of education: Not on file   Highest education level: Some college, no degree   Occupational History   Not on file  Tobacco Use   Smoking status: Former    Current packs/day: 0.00    Average packs/day: 1 pack/day for 35.0 years (35.0 ttl pk-yrs)    Types: Cigarettes    Start date: 08/25/1984    Quit date: 08/26/2019    Years since quitting: 5.0   Smokeless tobacco: Never  Vaping Use   Vaping status: Former  Substance and Sexual Activity   Alcohol use: No    Alcohol/week: 0.0 standard drinks of alcohol   Drug use: Not Currently    Frequency: 4.0 times per week    Types: Crack cocaine     Comment: RELAPSE 12/2016   Sexual activity: Not on file  Other Topics Concern   Not on file  Social History Narrative   Lives with son in an apartment on the third floor.  Does not work.  On disability.  Previously worked in [employer].    Education: high school.     Social Drivers of Corporate Investment Banker Strain: Low Risk  (09/17/2024)   Received from [facility]   Overall Financial Resource Strain (CARDIA)    How hard is it for you to pay for the very basics like food, housing, medical care, and heating?: Not very hard  Food Insecurity: Low Risk  (09/17/2024)   Received from [facility]   Hunger Vital Sign    Within the past 12 months, you worried that your food would run out before you got money to buy more: Never true    Within the past 12 months, the food you bought just didn't last and you didn't have money to get more. : Never true  Recent Concern: Food Insecurity - Food Insecurity Present (08/09/2024)   Hunger Vital Sign    Worried About Running Out of Food in the Last Year: Never true    Ran Out of Food in the Last Year: Sometimes true  Transportation Needs: No Transportation Needs (09/17/2024)   Received from [facility]    In the past 12 months, has lack of reliable transportation kept you from medical appointments, meetings, work or from getting things needed for daily living? : No  Recent Concern: Transportation Needs - Unmet  Transportation Needs (08/09/2024)   PRAPARE - Administrator, Civil Service (Medical): Yes    Lack of Transportation (Non-Medical): Yes  Physical Activity: Insufficiently Active (08/09/2024)   Exercise Vital Sign    Days of Exercise per Week: 4 days    Minutes of Exercise per Session: 30 min  Stress: Stress Concern Present (08/09/2024)   [employer] of Occupational Health - Occupational Stress Questionnaire    Feeling of Stress: To some extent  Social Connections: Moderately Isolated (09/17/2024)   Received from [facility]   Social Connection and Isolation Panel    In a typical week, how many times do you talk on the phone with family, friends, or neighbors?: More than three times a week    How often do you get together with friends or relatives?: More than three times a week    How often do you attend church or religious services?: More than 4 times per year    Do you belong to any clubs or organizations such as church groups, unions, fraternal or athletic groups, or school groups?: No    How often do you attend meetings of the clubs or organizations you belong to?: Never    Are you married, widowed, divorced, separated, never married, or living with a partner?: Widowed  Intimate Partner Violence: Not At Risk (04/05/2024)   Humiliation, Afraid, Rape, and Kick questionnaire    Fear of Current or Ex-Partner: No    Emotionally Abused: No    Physically Abused: No    Sexually Abused: No    Physical Exam      Future Appointments  Date Time Provider Department Center  09/26/2024 11:30 AM [facility] PA/NP [facility] None  10/08/2024  2:15 PM Gaynel Delon CROME, DPM [facility]  10/11/2024  8:30 AM Stacey [name] SQUIBB, NP [facility] [address]  10/21/2024 11:00 AM Fleeta Tonia Garnette CROME, RPH-CPP [facility] [address]  10/28/2024  3:45 PM Darlean, Ozell NOVAK, MD [facility] [id number]

## 2024-09-25 ENCOUNTER — Telehealth (HOSPITAL_COMMUNITY): Payer: Self-pay

## 2024-09-25 NOTE — Progress Notes (Incomplete)
 Patient ID: Stacey Lin, female   DOB: 02-04-1966, 58 y.o.   MRN: [id number]    Advanced Heart Failure Clinic Note   PCP: Stacey [name] SQUIBB, NP Pulmonologist: Dr. Darlean HF Cardiologist: Dr. 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  EF as low as 30-35% in 2014.   cMRI 08/27/13: EF 32% RV mildly dilated, no scar or infiltrative disease. Mod MR and Mod-sev TR  R/LHC (06/06/14): ectatic coronaries (suspect d/t HTN) no significant arthrosclerosis and normal EF. No significant MR noted  Echo (05/2014): EF 55-60%, mild/mod AI, grade I DD and mild MR  EF stable at 55-60% in 2019.   She was admitted in 06/23 with acute respiratory failure with hypoxia 2/2 acute on chronic systolic CHF. EF down to 30-35% on echo with normal RV, severe BAE, moderate to severe MR, moderate to severe TR and moderate to severe AI. Diuresed and GDMT titrated. R/LHC with no CAD and reduced Fick CI of 1.96 L/min/m2.  Readmit 10/24 with COPD exacerbation in setting of noncompliance with medications and ongoing tobacco use + recent cocaine  use. Repeat echo with EF 40-45%.  Admitted 01/25 with acute respiratory failure 2/2 COPD exacerbation and influenza A. She also had acute on chronic CHF and diuresed with IV lasix .  Admitted in 03/2024 with pneumonia. Echo down 30-35%. Mild MR/ mild AS. She was not discharged on spiro due to elevated potassium.   Admitted [facility] with CAP/COPD exacerbation. Briefly required bipap. Treatment included abx, steroids and breathing treatments. Blood cultures + for Enterobacterales and E. Coli. Pulmonary edema noted, given 7 days of lasix  then back to PRN.   Today she returns for post hospital follow up. Overall feeling ***. Denies palpitations, CP, dizziness, edema, or PND/Orthopnea. *** SOB.  Appetite ok. No fever or chills. Weight at home *** pounds. Taking all medications. Denies ETOH, tobacco or drug use.   FH: Mother deceased: Dementia, seizures, stomach cancer        Father deceased: HTN  Review of systems complete and found to be negative unless listed in HPI.    Past Medical History:  Diagnosis Date   Anemia    Arrhythmia    Asthma    CHF (congestive heart failure) (HCC)    1990s   Chronic headaches    COPD (chronic obstructive pulmonary disease) (HCC)    ??   Hypercholesteremia    Hypertension    Current Outpatient Medications  Medication Sig Dispense Refill   Accu-Chek Softclix Lancets lancets Use as instructed. Check Blood sugars daily before breakfast 100 each 12   albuterol  (VENTOLIN  HFA) 108 (90 Base) MCG/ACT inhaler INHALE 2 PUFFS INTO THE LUNGS EVERY 4 HOURS AS NEEDED FOR WHEEZING OR SHORTNESS OF BREATH 17 g 1   allopurinol  (ZYLOPRIM ) 300 MG tablet Take 300 mg by mouth 2 (two) times daily.     ammonium lactate  (LAC-HYDRIN ) 12 % lotion Apply 1 Application topically as needed for dry skin. 400 g 5   atorvastatin  (LIPITOR) 40 MG tablet Take 1 tablet (40 mg total) by mouth daily. 90 tablet 1   Blood Glucose Monitoring Suppl (ACCU-CHEK GUIDE) w/Device KIT Check Blood sugars daily before breakfast 1 kit 0   budesonide  (PULMICORT ) 0.25 MG/2ML nebulizer solution One vial twice daily with albuterol  in nebulizer 120 mL 12   busPIRone  (BUSPAR ) 5 MG tablet Take  1 tablet (5 mg total) by mouth 2 (two) times daily. 120 tablet 1   carvedilol  (COREG ) 3.125 MG tablet Take 1 tablet (3.125 mg total) by mouth 2 (two) times daily with a meal. 60 tablet 5   dapagliflozin  propanediol (FARXIGA ) 10 MG TABS tablet Take 1 tablet (10 mg total) by mouth daily. 90 tablet 1   famotidine  (PEPCID ) 20 MG tablet One after supper 90 tablet 1   fluticasone  furoate-vilanterol (BREO ELLIPTA ) 100-25 MCG/ACT AEPB Inhale 1 puff into the lungs daily. 1 each 11   furosemide  (LASIX ) 40 MG tablet Take  1 tablet (40 mg total) by mouth daily. May take an additional 40 mg as needed (Patient taking differently: Take 20 mg by mouth daily. Taking 20mg  daily for 7 days) 45 tablet 3   gabapentin  (NEURONTIN ) 300 MG capsule Take 1 capsule (300 mg total) by mouth 2 (two) times daily. 60 capsule 6   glucose blood (ACCU-CHEK GUIDE TEST) test strip Use as instructed. Check Blood sugars daily before breakfast 100 each 12   ipratropium-albuterol  (DUONEB) 0.5-2.5 (3) MG/3ML SOLN Take 3 mLs by nebulization 2 (two) times daily. 180 mL 1   metFORMIN  (GLUCOPHAGE -XR) 500 MG 24 hr tablet Take 1 tablet (500 mg total) by mouth daily. 90 tablet 1   naloxone  (NARCAN ) nasal spray 4 mg/0.1 mL Inject intranasal for overdose (Patient taking differently: Place 1 spray into the nose as needed. Inject intranasal for overdose) 1 each 1   pantoprazole  (PROTONIX ) 40 MG tablet TAKE 1 TABLET(40 MG) BY MOUTH DAILY 30 TO 60 MINUTES BEFORE FIRST MEAL OF THE DAY 90 tablet 2   QUEtiapine  (SEROQUEL ) 100 MG tablet Take 1 tablet (100 mg total) by mouth at bedtime. 90 tablet 1   Semaglutide , 1 MG/DOSE, 4 MG/3ML SOPN Inject 1 mg as directed once a week. Start after 4 injections of the 0.5mg  dose. 3 mL 1   Semaglutide ,0.25 or 0.5MG /DOS, (OZEMPIC , 0.25 OR 0.5 MG/DOSE,) 2 MG/3ML SOPN Inject 0.5 mg into the skin once a week. (Patient not taking: Reported on 09/20/2024) 3 mL 1   spironolactone  (ALDACTONE ) 25 MG tablet Take 1 tablet (25 mg total) by mouth daily. 45 tablet 3   traZODone  (DESYREL ) 50 MG tablet Take 1-2 tablets (50-100 mg total) by mouth at bedtime as needed for sleep. 60 tablet 0   valsartan  (DIOVAN ) 40 MG tablet Take 1 tablet (40 mg total) by mouth daily. 90 tablet 1   No current facility-administered medications for this visit.   Wt Readings from Last 3 Encounters:  09/24/24 82.6 kg (182 lb)  09/11/24 80.3 kg (177 lb)  09/09/24 80.2 kg (176 lb 12.8 oz)    LMP  (LMP Unknown)   PHYSICAL EXAM: General:  *** appearing.  No  respiratory difficulty Neck: JVD *** cm.  Cor: Regular rate & rhythm. No murmurs. Lungs: clear Extremities: no edema  Neuro: alert & oriented x 3. Affect pleasant.   ASSESSMENT & PLAN:   Chronic Systolic Heart Failure:   - Nonischemic cardiomyopathy.  - EF 30-35% in 2014 - Cath 8/15 showed normal cors  - Echo 6/19: EF 55-65%  - Echo 06/23: EF down to 30-35% on echo with normal RV, severe BAE, moderate to severe MR, moderate to severe TR and moderate to severe AI - Echo 10/24: EF 40-45%, RV okay, PASP 65 mmHg, moderate to severe MR, mild to moderate TR, moderate AI - Echo 5/25: EF 30-35%.  - Echo 09/05/24  EF 45-50% - NYHA II. Appears euvolemic. *** -  Continue 12.5 mg spiro 25 mg daily.  - Continue Lasix  40 mg daily. *** PRN? *** - Continue Coreg  3.125 mg bid. - Continue Farxiga  10 mg daily. - Continue valsartan  40 mg daily.  2.  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  HTN - Stable.   4. Substance abuse - UDS 5/25 + cocaine   - No recent cocaine  use.   5. Tobacco Abuse - Denies smoking  6. SDOH - Now living with her son.  - Now followed by paramedicine for med compliance.  - Gifted bus passes ***  7. Depression - Refered to HFSW to coping strategies.   Follow up in 4months. ***   Taejon Irani LITTIE Coe AGACNP-BC  7:52 PM

## 2024-09-25 NOTE — Telephone Encounter (Signed)
 Called to confirm/remind patient of their appointment at the [facility] on 09/26/24 11:30.    Appointment:   [x] Confirmed  [] Left mess   [] No answer/No voice mail  [] VM Full/unable to leave message  [] Phone not in service  Patient reminded to bring all medications and/or complete list.  Confirmed patient has transportation. Gave directions, instructed to utilize valet parking.

## 2024-09-26 ENCOUNTER — Ambulatory Visit (HOSPITAL_COMMUNITY): Admission: RE | Admit: 2024-09-26 | Discharge: 2024-09-26 | Attending: Internal Medicine

## 2024-09-26 ENCOUNTER — Encounter (HOSPITAL_COMMUNITY): Payer: Self-pay

## 2024-09-26 ENCOUNTER — Ambulatory Visit (HOSPITAL_COMMUNITY): Payer: Self-pay | Admitting: Internal Medicine

## 2024-09-26 VITALS — BP 118/72 | HR 98 | Ht 60.0 in | Wt 182.6 lb

## 2024-09-26 DIAGNOSIS — I10 Essential (primary) hypertension: Secondary | ICD-10-CM

## 2024-09-26 DIAGNOSIS — Z720 Tobacco use: Secondary | ICD-10-CM

## 2024-09-26 DIAGNOSIS — I38 Endocarditis, valve unspecified: Secondary | ICD-10-CM | POA: Diagnosis not present

## 2024-09-26 DIAGNOSIS — F1910 Other psychoactive substance abuse, uncomplicated: Secondary | ICD-10-CM

## 2024-09-26 DIAGNOSIS — I5020 Unspecified systolic (congestive) heart failure: Secondary | ICD-10-CM

## 2024-09-26 DIAGNOSIS — Z139 Encounter for screening, unspecified: Secondary | ICD-10-CM

## 2024-09-26 LAB — BASIC METABOLIC PANEL WITH GFR
Anion gap: 6 (ref 5–15)
BUN: 30 mg/dL — ABNORMAL HIGH (ref 6–20)
CO2: 30 mmol/L (ref 22–32)
Calcium: 9.2 mg/dL (ref 8.9–10.3)
Chloride: 103 mmol/L (ref 98–111)
Creatinine, Ser: 1.32 mg/dL — ABNORMAL HIGH (ref 0.44–1.00)
GFR, Estimated: 46 mL/min — ABNORMAL LOW (ref >60)
Glucose, Bld: 123 mg/dL — ABNORMAL HIGH (ref 70–99)
Potassium: 4.7 mmol/L (ref 3.5–5.1)
Sodium: 139 mmol/L (ref 135–145)

## 2024-09-26 LAB — BRAIN NATRIURETIC PEPTIDE: B Natriuretic Peptide: 9.4 pg/mL (ref 0.0–100.0)

## 2024-09-26 MED ORDER — FUROSEMIDE 40 MG PO TABS: 40.0000 mg | ORAL_TABLET | Freq: Every day | ORAL | 3 refills | Status: DC

## 2024-09-26 MED ORDER — VALSARTAN 40 MG PO TABS: 40.0000 mg | ORAL_TABLET | Freq: Every day | ORAL | Status: DC

## 2024-09-26 NOTE — Patient Instructions (Signed)
 Medication Changes:  RESTART Valsartan  40 mg Daily  INCREASE Furosemide  to 40 mg Daily, can take extra 40 mg as needed  Lab Work:  Labs done today, your results will be available in MyChart, we will contact you for abnormal readings.  Special Instructions // Education:  Do the following things EVERYDAY: Weigh yourself in the morning before breakfast. Write it down and keep it in a log. Take your medicines as prescribed Eat low salt foods--Limit salt (sodium) to 2000 mg per day.  Stay as active as you can everyday Limit all fluids for the day to less than 2 liters   Follow-Up in: 1 month   At the Advanced [facility] Failure Clinic, you and your health needs are our priority. We have a designated team specialized in the treatment of Heart Failure. This Care Team includes your primary Heart Failure Specialized Cardiologist (physician), Advanced Practice Providers (APPs- Physician Assistants and Nurse Practitioners), and Pharmacist who all work together to provide you with the care you need, when you need it.   You may see any of the following providers on your designated Care Team at your next follow up:  Dr. Toribio Fuel Dr. Ezra Shuck Dr. Odis Brownie Greig Mosses, NP Caffie Shed, [place] [facility] [place], [place] Beckey Coe, NP Jordan Lee, NP Tinnie Redman, PharmD   Please be sure to bring in all your medications bottles to every appointment.   Need to Contact Us :  If you have any questions or concerns before your next appointment please send us  a message through [place] or call our office at [phone].    TO LEAVE A MESSAGE FOR THE NURSE SELECT OPTION 2, PLEASE LEAVE A MESSAGE INCLUDING: YOUR NAME DATE OF BIRTH CALL BACK NUMBER REASON FOR CALL**this is important as we prioritize the call backs  YOU WILL RECEIVE A CALL BACK THE SAME DAY AS LONG AS YOU CALL BEFORE 4:00 PM

## 2024-09-27 ENCOUNTER — Other Ambulatory Visit (HOSPITAL_COMMUNITY): Payer: Self-pay | Admitting: Emergency Medicine

## 2024-09-27 MED ORDER — FUROSEMIDE 40 MG PO TABS: 20.0000 mg | ORAL_TABLET | Freq: Every day | ORAL | Status: DC

## 2024-09-27 NOTE — Progress Notes (Signed)
 Med rec. Only x 2 weeks  Valsartan  40mg  and Furosemide  20mg  daily w/ xtra 20 prn per Beckey Coe 09/26/24  Mary Sharps, EMT-Paramedic [phone] 09/27/2024

## 2024-09-30 ENCOUNTER — Telehealth (HOSPITAL_COMMUNITY): Payer: Self-pay | Admitting: Licensed Clinical Social Worker

## 2024-09-30 NOTE — Telephone Encounter (Signed)
 H&V Care Navigation CSW Progress Note  Clinical Social Worker contacted patient to check in regarding referral to psychology.  Patient reports they have reached out and sent paperwork for her to complete.  States she is still struggling with depression and copes by focusing on sleep hygiene and rest- provided active listening regarding her current struggles.  Offered to assist her with filling out referral paperwork as that can often be overwhelming- she will reach out if she wants assistance with this.  Will continue to follow through clinic and assist as needed.  Andriette HILARIO Leech, LCSW Clinical Social Worker Advanced [facility] Failure Clinic Desk#: [phone] Cell#: [phone]

## 2024-10-02 ENCOUNTER — Telehealth (INDEPENDENT_AMBULATORY_CARE_PROVIDER_SITE_OTHER): Payer: Self-pay

## 2024-10-02 ENCOUNTER — Other Ambulatory Visit (HOSPITAL_COMMUNITY): Payer: Self-pay | Admitting: Emergency Medicine

## 2024-10-02 ENCOUNTER — Other Ambulatory Visit: Payer: Self-pay

## 2024-10-02 NOTE — Progress Notes (Signed)
 Paramedicine Encounter    Patient ID: Stacey Lin, female    DOB: Mar 05, 1966, 58 y.o.   MRN: [id number]   Complaints Chronic left shoulder pain  Assessment A&O x 4, skin W&D w/ good color.  Denies chest pain or SOB.  No peripheral edema noted.  Pt. Is up 7lb since last visit.  She denies dizziness or headache.  Baseline SOB w/ exertion (going up and down stairs)  Compliant w/ meds.  Additional 20mg . Furosemide  given due to weight gain per standing orders. Assisted w/ 1mg . Ozempic  in rt arm  CBG elevated today.  Compliance with meds YES  Pill box filled  x 1 week  Refills needed NONE  Meds changes since last visit NONE    Social changes NONE CBG 235 LMP  (LMP Unknown)  Weight yesterday- 189.4lb Last visit weight-182.6lb  ACTION: Home visit completed  [name] [phone] 10/02/24  Patient Care Team: Celestia [name] SQUIBB, NP as PCP - General (Internal Medicine)  Patient Active Problem List   Diagnosis Date Noted   Low blood pressure, not hypotension (required pausing some of her GDMT) 04/18/2024   Pruritic intertrigo 04/18/2024   Hot flashes due to menopause 04/18/2024   Constipation 04/18/2024   Acute respiratory distress 04/05/2024   Lumbar radiculopathy 03/13/2024   CHF (congestive heart failure) (HCC) 02/29/2024   COPD with acute exacerbation (HCC) 02/28/2024   Respiratory failure with hypoxia (HCC) 11/21/2023   COPD exacerbation (HCC) 07/25/2023   Nonrheumatic aortic valve insufficiency    Elevated LFTs 04/08/2022   Acute metabolic encephalopathy 04/08/2022   Depressed mood 04/08/2022   Headache 04/07/2022   Obesity (BMI 30-39.9) 04/07/2022   Acute exacerbation of CHF (congestive heart failure) (HCC) 04/06/2022   Polysubstance abuse (HCC) 04/06/2022   Elevated troponin 04/06/2022   Controlled type 2 diabetes mellitus with hyperglycemia (HCC) 04/06/2022   Transaminitis 04/06/2022   Hypoalbuminemia 04/06/2022   Other recurrent depressive  disorders 02/11/2021   Upper airway cough syndrome 12/20/2018   Cigarette smoker 11/21/2018   Asthmatic bronchitis, moderate persistent, uncomplicated 11/20/2018   DOE (dyspnea on exertion) 04/14/2016   Streptococcus pneumoniae pneumonia 04/07/2016   Acute respiratory failure with hypoxia (HCC) 04/03/2016   Elevated lactic acid level 04/03/2016   Leukocytosis 04/03/2016   New onset type 2 diabetes mellitus (HCC) 04/03/2016   Abnormal urinalysis 04/03/2016   Trichomonas infection 04/03/2016   Non compliance w medication regimen 01/12/2016   COPD without exacerbation (HCC) 12/04/2014   AKI (acute kidney injury) 03/14/2014   Acute on chronic systolic heart failure (HCC) 03/14/2014   Musculoskeletal chest pain 03/14/2014   Dyspnea 12/03/2013   Periodic health assessment, general screening, adult 10/03/2013   Mitral valve regurgitation 08/22/2013   FIBROIDS, UTERUS 05/11/2010   ANEMIA, SECONDARY TO BLOOD LOSS 05/11/2010   Asthmatic bronchitis 05/11/2010   TOBACCO USER 07/07/2009   Essential hypertension, benign 06/19/2009   MENORRHAGIA 06/19/2009   COCAINE  ABUSE, HX OF 06/19/2009    Current Outpatient Medications:    albuterol  (VENTOLIN  HFA) 108 (90 Base) MCG/ACT inhaler, INHALE 2 PUFFS INTO THE LUNGS EVERY 4 HOURS AS NEEDED FOR WHEEZING OR SHORTNESS OF BREATH, Disp: 17 g, Rfl: 1   allopurinol  (ZYLOPRIM ) 300 MG tablet, Take 300 mg by mouth 2 (two) times daily., Disp: , Rfl:    ammonium lactate  (LAC-HYDRIN ) 12 % lotion, Apply 1 Application topically as needed for dry skin., Disp: 400 g, Rfl: 5   atorvastatin  (LIPITOR) 40 MG tablet, Take 1 tablet (40 mg total) by mouth daily., Disp:  90 tablet, Rfl: 1   budesonide  (PULMICORT ) 0.25 MG/2ML nebulizer solution, One vial twice daily with albuterol  in nebulizer, Disp: 120 mL, Rfl: 12   busPIRone  (BUSPAR ) 5 MG tablet, Take 1 tablet (5 mg total) by mouth 2 (two) times daily., Disp: 120 tablet, Rfl: 1   carvedilol  (COREG ) 3.125 MG tablet, Take 1  tablet (3.125 mg total) by mouth 2 (two) times daily with a meal., Disp: 60 tablet, Rfl: 5   dapagliflozin  propanediol (FARXIGA ) 10 MG TABS tablet, Take 1 tablet (10 mg total) by mouth daily., Disp: 90 tablet, Rfl: 1   famotidine  (PEPCID ) 20 MG tablet, One after supper, Disp: 90 tablet, Rfl: 1   fluticasone  furoate-vilanterol (BREO ELLIPTA ) 100-25 MCG/ACT AEPB, Inhale 1 puff into the lungs daily., Disp: 1 each, Rfl: 11   furosemide  (LASIX ) 40 MG tablet, Take 0.5 tablets (20 mg total) by mouth daily. May take an additional 20 mg as needed, Disp: , Rfl:    gabapentin  (NEURONTIN ) 300 MG capsule, Take 1 capsule (300 mg total) by mouth 2 (two) times daily., Disp: 60 capsule, Rfl: 6   ipratropium-albuterol  (DUONEB) 0.5-2.5 (3) MG/3ML SOLN, Take 3 mLs by nebulization 2 (two) times daily., Disp: 180 mL, Rfl: 1   metFORMIN  (GLUCOPHAGE -XR) 500 MG 24 hr tablet, Take 1 tablet (500 mg total) by mouth daily., Disp: 90 tablet, Rfl: 1   pantoprazole  (PROTONIX ) 40 MG tablet, TAKE 1 TABLET(40 MG) BY MOUTH DAILY 30 TO 60 MINUTES BEFORE FIRST MEAL OF THE DAY, Disp: 90 tablet, Rfl: 2   QUEtiapine  (SEROQUEL ) 100 MG tablet, Take 1 tablet (100 mg total) by mouth at bedtime., Disp: 90 tablet, Rfl: 1   Semaglutide , 1 MG/DOSE, 4 MG/3ML SOPN, Inject 1 mg as directed once a week. Start after 4 injections of the 0.5mg  dose., Disp: 3 mL, Rfl: 1   spironolactone  (ALDACTONE ) 25 MG tablet, Take 1 tablet (25 mg total) by mouth daily., Disp: 45 tablet, Rfl: 3   valsartan  (DIOVAN ) 40 MG tablet, Take 1 tablet (40 mg total) by mouth daily., Disp: , Rfl:    Accu-Chek Softclix Lancets lancets, Use as instructed. Check Blood sugars daily before breakfast, Disp: 100 each, Rfl: 12   Blood Glucose Monitoring Suppl (ACCU-CHEK GUIDE) w/Device KIT, Check Blood sugars daily before breakfast, Disp: 1 kit, Rfl: 0   glucose blood (ACCU-CHEK GUIDE TEST) test strip, Use as instructed. Check Blood sugars daily before breakfast, Disp: 100 each, Rfl: 12    naloxone  (NARCAN ) nasal spray 4 mg/0.1 mL, Inject intranasal for overdose (Patient not taking: Reported on 10/02/2024), Disp: 1 each, Rfl: 1   Semaglutide ,0.25 or 0.5MG /DOS, (OZEMPIC , 0.25 OR 0.5 MG/DOSE,) 2 MG/3ML SOPN, Inject 0.5 mg into the skin once a week. (Patient not taking: Reported on 10/02/2024), Disp: 3 mL, Rfl: 1   traZODone  (DESYREL ) 50 MG tablet, Take 1-2 tablets (50-100 mg total) by mouth at bedtime as needed for sleep. (Patient not taking: Reported on 10/02/2024), Disp: 60 tablet, Rfl: 0 No Known Allergies   Social History   Socioeconomic History   Marital status: Divorced    Spouse name: Not on file   Number of children: Not on file   Years of education: Not on file   Highest education level: Some college, no degree  Occupational History   Not on file  Tobacco Use   Smoking status: Former    Current packs/day: 0.00    Average packs/day: 1 pack/day for 35.0 years (35.0 ttl pk-yrs)    Types: Cigarettes  Start date: 08/25/1984    Quit date: 08/26/2019    Years since quitting: 5.1   Smokeless tobacco: Never  Vaping Use   Vaping status: Former  Substance and Sexual Activity   Alcohol use: No    Alcohol/week: 0.0 standard drinks of alcohol   Drug use: Not Currently    Frequency: 4.0 times per week    Types: Crack cocaine     Comment: RELAPSE 12/2016   Sexual activity: Not on file  Other Topics Concern   Not on file  Social History Narrative   Lives with son in an apartment on the third floor.  Does not work.  On disability.  Previously worked in [employer].    Education: high school.     Social Drivers of Corporate Investment Banker Strain: Low Risk (09/17/2024)   Received from [facility]   Overall Financial Resource Strain (CARDIA)    How hard is it for you to pay for the very basics like food, housing, medical care, and heating?: Not very hard  Food Insecurity: Low Risk (09/17/2024)   Received from [facility]   Hunger Vital Sign    Within the past  12 months, you worried that your food would run out before you got money to buy more: Never true    Within the past 12 months, the food you bought just didn't last and you didn't have money to get more. : Never true  Recent Concern: Food Insecurity - Food Insecurity Present (08/09/2024)   Hunger Vital Sign    Worried About Running Out of Food in the Last Year: Never true    Ran Out of Food in the Last Year: Sometimes true  Transportation Needs: No Transportation Needs (09/17/2024)   Received from [facility]    In the past 12 months, has lack of reliable transportation kept you from medical appointments, meetings, work or from getting things needed for daily living? : No  Recent Concern: Transportation Needs - Unmet Transportation Needs (08/09/2024)   PRAPARE - Administrator, Civil Service (Medical): Yes    Lack of Transportation (Non-Medical): Yes  Physical Activity: Insufficiently Active (08/09/2024)   Exercise Vital Sign    Days of Exercise per Week: 4 days    Minutes of Exercise per Session: 30 min  Stress: Stress Concern Present (08/09/2024)   [employer] of Occupational Health - Occupational Stress Questionnaire    Feeling of Stress: To some extent  Social Connections: Moderately Isolated (09/17/2024)   Received from [facility]   Social Connection and Isolation Panel    In a typical week, how many times do you talk on the phone with family, friends, or neighbors?: More than three times a week    How often do you get together with friends or relatives?: More than three times a week    How often do you attend church or religious services?: More than 4 times per year    Do you belong to any clubs or organizations such as church groups, unions, fraternal or athletic groups, or school groups?: No    How often do you attend meetings of the clubs or organizations you belong to?: Never    Are you married, widowed, divorced, separated, never married,  or living with a partner?: Widowed  Intimate Partner Violence: Not At Risk (04/05/2024)   Humiliation, Afraid, Rape, and Kick questionnaire    Fear of Current or Ex-Partner: No    Emotionally Abused: No  Physically Abused: No    Sexually Abused: No    Physical Exam      Future Appointments  Date Time Provider Department Center  10/08/2024  2:15 PM Gaynel Delon CROME, DPM [facility]  10/11/2024  8:30 AM Celestia [name] SQUIBB, NP [facility] [place] [address]  10/21/2024 11:00 AM Fleeta Tonia Garnette CROME, R[facility] [address]  10/28/2024  3:45 PM Darlean Ozell NOVAK, MD [facility] [address]  10/29/2024 10:30 AM [facility] PA/NP [facility] None

## 2024-10-02 NOTE — Telephone Encounter (Signed)
 Copied from CRM #[id number]. Topic: General - Other >> Oct 01, 2024  4:36 PM Stacey Lin wrote: Reason for CRM: Patient states she would like to speak with PCP, has a question to ask that is not medical. Declined to provide me more information. # [phone]

## 2024-10-03 ENCOUNTER — Telehealth (HOSPITAL_COMMUNITY): Payer: Self-pay | Admitting: Emergency Medicine

## 2024-10-03 NOTE — Telephone Encounter (Signed)
 Stacey Lin text me this morning to advise her weight this morning is 184.2lb.  Yesterdays home visit her weight was 189.2lb.  She too an extra 20mg . Furosemide .  She has

## 2024-10-07 ENCOUNTER — Telehealth (INDEPENDENT_AMBULATORY_CARE_PROVIDER_SITE_OTHER): Payer: Self-pay | Admitting: Primary Care

## 2024-10-07 NOTE — Telephone Encounter (Signed)
 Copied from CRM #[id number]. Topic: General - Call Back - No Documentation >> Oct 07, 2024  3:29 PM Harlene ORN wrote: Reason for CRM: Patient is returning call from the PCP. PCP will call back when available to talk to the patient.

## 2024-10-08 ENCOUNTER — Telehealth (INDEPENDENT_AMBULATORY_CARE_PROVIDER_SITE_OTHER): Payer: Self-pay

## 2024-10-08 ENCOUNTER — Ambulatory Visit: Admitting: Podiatry

## 2024-10-08 ENCOUNTER — Encounter: Payer: Self-pay | Admitting: Podiatry

## 2024-10-08 DIAGNOSIS — E119 Type 2 diabetes mellitus without complications: Secondary | ICD-10-CM

## 2024-10-08 DIAGNOSIS — M79675 Pain in left toe(s): Secondary | ICD-10-CM | POA: Diagnosis not present

## 2024-10-08 DIAGNOSIS — B351 Tinea unguium: Secondary | ICD-10-CM | POA: Diagnosis not present

## 2024-10-08 DIAGNOSIS — M79674 Pain in right toe(s): Secondary | ICD-10-CM

## 2024-10-08 NOTE — Telephone Encounter (Signed)
 Will forward to provider

## 2024-10-08 NOTE — Telephone Encounter (Signed)
 Copied from CRM #[id number]. Topic: General - Other >> Oct 07, 2024  3:31 PM Winona R wrote: Pt requesting NP. Edwards send her the address to the home again.

## 2024-10-09 ENCOUNTER — Other Ambulatory Visit (HOSPITAL_COMMUNITY): Payer: Self-pay | Admitting: Emergency Medicine

## 2024-10-09 NOTE — Telephone Encounter (Signed)
 Will forward to provider

## 2024-10-09 NOTE — Progress Notes (Signed)
 Refills: ,  Paramedicine Encounter    Patient ID: Stacey Lin, female    DOB: [birth year], 58 y.o.   MRN: [id number]   Complaints Mild exertional SOB  Assessment A&O x 4, skin W&D w/ good color.  Has some mild exertional SOB.  No chest pain.  Lung sounds clear bilat.  No peripheral edema noted.   Compliance with meds YES  Pill box filled x 3 weeks.  Refills needed Famotidine , Carvedilol , Gabapentin , Buspirone   Meds changes since last visit NONE    Social changes NONE   LMP  (LMP Unknown)  Weight yesterday- Last visit weight-189.2lb  Took an xtra 20mg . Furosemide  today due to weight being up and mild SOB.  She advises she will call me tomorrow with weight.  ACTION: Home visit completed  [name] [phone] 10/09/2024  Patient Care Team: Celestia [name] SQUIBB, NP as PCP - General (Internal Medicine)  Patient Active Problem List   Diagnosis Date Noted   Low blood pressure, not hypotension (required pausing some of her GDMT) 04/18/2024   Pruritic intertrigo 04/18/2024   Hot flashes due to menopause 04/18/2024   Constipation 04/18/2024   Acute respiratory distress 04/05/2024   Lumbar radiculopathy 03/13/2024   CHF (congestive heart failure) (HCC) 02/29/2024   COPD with acute exacerbation (HCC) 02/28/2024   Respiratory failure with hypoxia (HCC) 11/21/2023   COPD exacerbation (HCC) 07/25/2023   Nonrheumatic aortic valve insufficiency    Elevated LFTs 04/08/2022   Acute metabolic encephalopathy 04/08/2022   Depressed mood 04/08/2022   Headache 04/07/2022   Obesity (BMI 30-39.9) 04/07/2022   Acute exacerbation of CHF (congestive heart failure) (HCC) 04/06/2022   Polysubstance abuse (HCC) 04/06/2022   Elevated troponin 04/06/2022   Controlled type 2 diabetes mellitus with hyperglycemia (HCC) 04/06/2022   Transaminitis 04/06/2022   Hypoalbuminemia 04/06/2022   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/11/2021   Upper airway cough syndrome  12/20/2018   Cigarette smoker 11/21/2018   Asthmatic bronchitis, moderate persistent, uncomplicated 11/20/2018   DOE (dyspnea on exertion) 04/14/2016   Streptococcus pneumoniae pneumonia 04/07/2016   Acute respiratory failure with hypoxia (HCC) 04/03/2016   Elevated lactic acid level 04/03/2016   Leukocytosis 04/03/2016   New onset type 2 diabetes mellitus (HCC) 04/03/2016   Abnormal urinalysis 04/03/2016   Trichomonas infection 04/03/2016   Non compliance w medication regimen 01/12/2016   COPD without exacerbation (HCC) 12/04/2014   AKI (acute kidney injury) 03/14/2014   Acute on chronic systolic heart failure (HCC) 03/14/2014   Musculoskeletal chest pain 03/14/2014   Dyspnea 12/03/2013   Periodic health assessment, general screening, adult 10/03/2013   Mitral valve regurgitation 08/22/2013   FIBROIDS, UTERUS 05/11/2010   ANEMIA, SECONDARY TO BLOOD LOSS 05/11/2010   Asthmatic bronchitis 05/11/2010   TOBACCO USER 07/07/2009   Essential hypertension, benign 06/19/2009   MENORRHAGIA 06/19/2009   COCAINE  ABUSE, HX OF 06/19/2009   Current Medications[1] Allergies[2]   Social History   Socioeconomic History   Marital status: Divorced    Spouse name: Not on file   Number of children: Not on file   Years of education: Not on file   Highest education level: Some college, no degree  Occupational History   Not on file  Tobacco Use   Smoking status: Some Days    Current packs/day: 0.00    Average packs/day: 1 pack/day for 35.0 years (35.0 ttl pk-yrs)    Types: Cigarettes    Start date: 08/25/1984    Last attempt to quit: 08/26/2019    Years since  quitting: 5.1   Smokeless tobacco: Never  Vaping Use   Vaping status: Former  Substance and Sexual Activity   Alcohol use: No    Alcohol/week: 0.0 standard drinks of alcohol   Drug use: Not Currently    Frequency: 4.0 times per week    Types: Crack cocaine     Comment: RELAPSE 12/2016   Sexual activity: Not on file  Other  Topics Concern   Not on file  Social History Narrative   Lives with son in an apartment on the third floor.  Does not work.  On disability.  Previously worked in [employer].    Education: high school.     Social Drivers of Health   Tobacco Use: High Risk (10/08/2024)   Patient History    Smoking Tobacco Use: Some Days    Smokeless Tobacco Use: Never    Passive Exposure: Not on file  Financial Resource Strain: Low Risk (09/17/2024)   Received from [facility]   Overall Financial Resource Strain (CARDIA)    How hard is it for you to pay for the very basics like food, housing, medical care, and heating?: Not very hard  Food Insecurity: Low Risk (09/17/2024)   Received from [facility]   Epic    Within the past 12 months, you worried that your food would run out before you got money to buy more: Never true    Within the past 12 months, the food you bought just didn't last and you didn't have money to get more. : Never true  Recent Concern: Food Insecurity - Food Insecurity Present (08/09/2024)   Epic    Worried About Programme Researcher, Broadcasting/film/video in the Last Year: Never true    Ran Out of Food in the Last Year: Sometimes true  Transportation Needs: No Transportation Needs (09/17/2024)   Received from [facility]    In the past 12 months, has lack of reliable transportation kept you from medical appointments, meetings, work or from getting things needed for daily living? : No  Recent Concern: Transportation Needs - Unmet Transportation Needs (08/09/2024)   Epic    Lack of Transportation (Medical): Yes    Lack of Transportation (Non-Medical): Yes  Physical Activity: Insufficiently Active (08/09/2024)   Exercise Vital Sign    Days of Exercise per Week: 4 days    Minutes of Exercise per Session: 30 min  Stress: Stress Concern Present (08/09/2024)   [employer] of Occupational Health - Occupational Stress Questionnaire    Feeling of Stress: To some extent  Social  Connections: Moderately Isolated (09/17/2024)   Received from [facility]   Social Connection and Isolation Panel    In a typical week, how many times do you talk on the phone with family, friends, or neighbors?: More than three times a week    How often do you get together with friends or relatives?: More than three times a week    How often do you attend church or religious services?: More than 4 times per year    Do you belong to any clubs or organizations such as church groups, unions, fraternal or athletic groups, or school groups?: No    How often do you attend meetings of the clubs or organizations you belong to?: Never    Are you married, widowed, divorced, separated, never married, or living with a partner?: Widowed  Intimate Partner Violence: Not At Risk (04/05/2024)   Epic    Fear of Current or Ex-Partner:  No    Emotionally Abused: No    Physically Abused: No    Sexually Abused: No  Depression (PHQ2-9): High Risk (09/09/2024)   Depression (PHQ2-9)    PHQ-2 Score: 21  Alcohol Screen: Not on file  Housing: Low Risk (09/17/2024)   Received from [facility]   Epic    What is your living situation today?: I have a steady place to live    Think about the place you live. Do you have problems with any of the following? Choose all that apply:: None/None on this list  Recent Concern: Housing - High Risk (08/09/2024)   Epic    Unable to Pay for Housing in the Last Year: Yes    Number of Times Moved in the Last Year: 1    Homeless in the Last Year: No  Utilities: Low Risk (09/17/2024)   Received from [facility]   Utilities    In the past 12 months has the electric, gas, oil, or water company threatened to shut off services in your home? : No  Health Literacy: Adequate Health Literacy (05/06/2024)   B1300 Health Literacy    Frequency of need for help with medical instructions: Rarely    Physical Exam      Future Appointments  Date Time Provider Department Center   10/11/2024  8:30 AM Celestia [name] SQUIBB, NP [facility] [address]  10/21/2024 11:00 AM Fleeta Tonia Garnette LITTIE, RPH-CPP [facility] [address]  10/28/2024  3:45 PM Darlean Ozell NOVAK, MD [facility] [address]  10/29/2024 10:30 AM [facility] PA/NP [facility] None  01/21/2025  2:45 PM [place], Delon LITTIE, DPM [facility]          [1]  Current Outpatient Medications:    albuterol  (VENTOLIN  HFA) 108 (90 Base) MCG/ACT inhaler, INHALE 2 PUFFS INTO THE LUNGS EVERY 4 HOURS AS NEEDED FOR WHEEZING OR SHORTNESS OF BREATH, Disp: 17 g, Rfl: 1   allopurinol  (ZYLOPRIM ) 300 MG tablet, Take 300 mg by mouth 2 (two) times daily., Disp: , Rfl:    ammonium lactate  (LAC-HYDRIN ) 12 % lotion, Apply 1 Application topically as needed for dry skin., Disp: 400 g, Rfl: 5   atorvastatin  (LIPITOR) 40 MG tablet, Take 1 tablet (40 mg total) by mouth daily., Disp: 90 tablet, Rfl: 1   budesonide  (PULMICORT ) 0.25 MG/2ML nebulizer solution, One vial twice daily with albuterol  in nebulizer, Disp: 120 mL, Rfl: 12   busPIRone  (BUSPAR ) 5 MG tablet, Take 1 tablet (5 mg total) by mouth 2 (two) times daily., Disp: 120 tablet, Rfl: 1   carvedilol  (COREG ) 3.125 MG tablet, Take 1 tablet (3.125 mg total) by mouth 2 (two) times daily with a meal., Disp: 60 tablet, Rfl: 5   dapagliflozin  propanediol (FARXIGA ) 10 MG TABS tablet, Take 1 tablet (10 mg total) by mouth daily., Disp: 90 tablet, Rfl: 1   famotidine  (PEPCID ) 20 MG tablet, One after supper, Disp: 90 tablet, Rfl: 1   fluticasone  furoate-vilanterol (BREO ELLIPTA ) 100-25 MCG/ACT AEPB, Inhale 1 puff into the lungs daily., Disp: 1 each, Rfl: 11   furosemide  (LASIX ) 40 MG tablet, Take 0.5 tablets (20 mg total) by mouth daily. May take an additional 20 mg as needed, Disp: , Rfl:    gabapentin  (NEURONTIN ) 300 MG capsule, Take 1 capsule (300 mg total) by mouth 2 (two) times daily., Disp: 60 capsule, Rfl: 6   ipratropium-albuterol  (DUONEB) 0.5-2.5 (3) MG/3ML SOLN, Take 3 mLs by  nebulization 2 (two) times daily., Disp: 180 mL, Rfl: 1  metFORMIN  (GLUCOPHAGE -XR) 500 MG 24 hr tablet, Take 1 tablet (500 mg total) by mouth daily., Disp: 90 tablet, Rfl: 1   pantoprazole  (PROTONIX ) 40 MG tablet, TAKE 1 TABLET(40 MG) BY MOUTH DAILY 30 TO 60 MINUTES BEFORE FIRST MEAL OF THE DAY, Disp: 90 tablet, Rfl: 2   QUEtiapine  (SEROQUEL ) 100 MG tablet, Take 1 tablet (100 mg total) by mouth at bedtime., Disp: 90 tablet, Rfl: 1   Semaglutide , 1 MG/DOSE, 4 MG/3ML SOPN, Inject 1 mg as directed once a week. Start after 4 injections of the 0.5mg  dose., Disp: 3 mL, Rfl: 1   spironolactone  (ALDACTONE ) 25 MG tablet, Take 1 tablet (25 mg total) by mouth daily., Disp: 45 tablet, Rfl: 3   valsartan  (DIOVAN ) 40 MG tablet, Take 1 tablet (40 mg total) by mouth daily., Disp: , Rfl:    Accu-Chek Softclix Lancets lancets, Use as instructed. Check Blood sugars daily before breakfast (Patient not taking: Reported on 10/09/2024), Disp: 100 each, Rfl: 12   Blood Glucose Monitoring Suppl (ACCU-CHEK GUIDE) w/Device KIT, Check Blood sugars daily before breakfast (Patient not taking: Reported on 10/09/2024), Disp: 1 kit, Rfl: 0   glucose blood (ACCU-CHEK GUIDE TEST) test strip, Use as instructed. Check Blood sugars daily before breakfast (Patient not taking: Reported on 10/09/2024), Disp: 100 each, Rfl: 12   naloxone  (NARCAN ) nasal spray 4 mg/0.1 mL, Inject intranasal for overdose (Patient not taking: Reported on 10/09/2024), Disp: 1 each, Rfl: 1   Semaglutide ,0.25 or 0.5MG /DOS, (OZEMPIC , 0.25 OR 0.5 MG/DOSE,) 2 MG/3ML SOPN, Inject 0.5 mg into the skin once a week. (Patient not taking: Reported on 10/09/2024), Disp: 3 mL, Rfl: 1   traZODone  (DESYREL ) 50 MG tablet, Take 1-2 tablets (50-100 mg total) by mouth at bedtime as needed for sleep. (Patient not taking: Reported on 10/09/2024), Disp: 60 tablet, Rfl: 0 [2] No Known Allergies

## 2024-10-10 NOTE — Telephone Encounter (Signed)
 Error

## 2024-10-11 ENCOUNTER — Encounter (INDEPENDENT_AMBULATORY_CARE_PROVIDER_SITE_OTHER): Payer: Self-pay | Admitting: Primary Care

## 2024-10-11 ENCOUNTER — Ambulatory Visit (INDEPENDENT_AMBULATORY_CARE_PROVIDER_SITE_OTHER): Admitting: Primary Care

## 2024-10-11 VITALS — BP 100/69 | HR 90 | Resp 16 | Wt 182.4 lb

## 2024-10-11 DIAGNOSIS — Z720 Tobacco use: Secondary | ICD-10-CM | POA: Diagnosis not present

## 2024-10-11 DIAGNOSIS — Z0283 Encounter for blood-alcohol and blood-drug test: Secondary | ICD-10-CM

## 2024-10-11 DIAGNOSIS — D509 Iron deficiency anemia, unspecified: Secondary | ICD-10-CM | POA: Diagnosis not present

## 2024-10-11 DIAGNOSIS — R0981 Nasal congestion: Secondary | ICD-10-CM

## 2024-10-11 MED ORDER — FLUTICASONE PROPIONATE 50 MCG/ACT NA SUSP: 2.0000 | Freq: Every day | NASAL | 6 refills | Status: AC

## 2024-10-11 NOTE — Progress Notes (Unsigned)
 5 months

## 2024-10-11 NOTE — Telephone Encounter (Signed)
 Pt was seen today in the office

## 2024-10-13 NOTE — Progress Notes (Signed)
"  °  Subjective:  Patient ID: Stacey Lin, female    DOB: 08/03/1966,  MRN: [id number]  Stacey Lin presents to clinic today for preventative diabetic foot care for painful mycotic toenails of both feet that are difficult to trim. Pain interferes with daily activities and wearing enclosed shoe gear comfortably.  Chief Complaint  Patient presents with   Diabetes    I'm here for my three month follow-up. Saw [name] P, NP. ARNETTA 09/17/2024; A1c - 6.5   New problem(s): None.   PCP is [name] SQUIBB, NP.  Allergies[1]  Review of Systems: Negative except as noted in the HPI.  Objective: No changes noted in today's physical examination. There were no vitals filed for this visit. Stacey Lin is a pleasant 58 y.o. female in NAD. AAO x 3.  Vascular Examination: Capillary refill time immediate b/l. Faintly palpable pedal pulses. Pedal hair diminished b/l. No pain with calf compression b/l. Skin temperature gradient WNL b/l. No cyanosis or clubbing b/l. No ischemia or gangrene noted b/l. No edema noted b/l LE.  Neurological Examination: Sensation grossly intact b/l with 10 gram monofilament.   Dermatological Examination: Pedal skin with normal turgor, texture and tone b/l.  No open wounds. No interdigital macerations.   Toenails 1-5 b/l thick, discolored, elongated with subungual debris and pain on dorsal palpation.   Musculoskeletal Examination: Muscle strength 5/5 to all lower extremity muscle groups bilaterally. No pain, crepitus or joint limitation noted with ROM bilateral LE. Pes planus deformity noted bilateral LE. Uses cane for ambulation assistance.  Radiographs: None  Assessment/Plan: 1. Pain due to onychomycosis of toenails of both feet   2. Controlled type 2 diabetes mellitus without complication, without long-term current use of insulin  [name])   Consent given for treatment. Patient examined. All patient's and/or POA's questions/concerns addressed on  today's visit. Toenails 1-5 b/l debrided in length and girth without incident. Continue foot and shoe inspections daily. Monitor blood glucose per PCP/Endocrinologist's recommendations. Continue soft, supportive shoe gear daily. Report any pedal injuries to medical professional. Call office if there are any questions/concerns. -Patient/POA to call should there be question/concern in the interim.   Return in about 3 months (around 01/06/2025).  [name], DPM      [place] LOCATION: [address], [place] [address]                   Office [phone]   [facility] LOCATION: [address] [place], [place] [address] Office [phone]     [1] No Known Allergies  "

## 2024-10-21 ENCOUNTER — Ambulatory Visit: Admitting: Pharmacist

## 2024-10-22 ENCOUNTER — Telehealth (INDEPENDENT_AMBULATORY_CARE_PROVIDER_SITE_OTHER): Payer: Self-pay | Admitting: Primary Care

## 2024-10-22 NOTE — Telephone Encounter (Signed)
 Copied from CRM #[id number]. Topic: General - Other >> Oct 22, 2024  8:30 AM Myrick T wrote: Reason for CRM: patient called requested a call back from provider asap for a personal reason.

## 2024-10-23 ENCOUNTER — Other Ambulatory Visit (HOSPITAL_COMMUNITY): Payer: Self-pay | Admitting: Emergency Medicine

## 2024-10-23 NOTE — Progress Notes (Signed)
 Paramedicine Encounter    Patient ID: Stacey Lin, female    DOB: May 20, 1966, 58 y.o.   MRN: [id number]   Complaints NONE  Assessment A&O x 4, skin W&D w/ good color.  Denies chest pain or SOB. Lung sound w/ mild wheezing in upper lobes.  Assisted her putting her nebulizer together.  The tubing she had did not fit her nebulizer machine.  Set the machine up and encouraged her to get back on tract to take her breathing treatments.  Compliance with meds Yes- except for breathing treatments  Pill box filled x 2 weeks (2nd week needs Gabapentin  added)   Refills needed Gabapentin , Famotidine , Spironolactone , Carvedilol  and Buspar   Meds changes since last visit NONE    Social changes none   BP 126/80 (BP Location: Right Arm, Patient Position: Sitting, Cuff Size: Normal)   Pulse 80   Resp 16   Wt 192 lb 9.6 oz (87.4 kg)   LMP  (LMP Unknown)   BMI 37.61 kg/m  Weight yesterday- Last visit weight-189.8lb  ACTION: Home visit completed  [name] [phone] 10/23/2024  Patient Care Team: Celestia [name] SQUIBB, NP as PCP - General (Internal Medicine)  Patient Active Problem List   Diagnosis Date Noted   Low blood pressure, not hypotension (required pausing some of her GDMT) 04/18/2024   Pruritic intertrigo 04/18/2024   Hot flashes due to menopause 04/18/2024   Constipation 04/18/2024   Acute respiratory distress 04/05/2024   Lumbar radiculopathy 03/13/2024   CHF (congestive heart failure) (HCC) 02/29/2024   COPD with acute exacerbation (HCC) 02/28/2024   Respiratory failure with hypoxia (HCC) 11/21/2023   COPD exacerbation (HCC) 07/25/2023   Nonrheumatic aortic valve insufficiency    Elevated LFTs 04/08/2022   Acute metabolic encephalopathy 04/08/2022   Depressed mood 04/08/2022   Headache 04/07/2022   Obesity (BMI 30-39.9) 04/07/2022   Acute exacerbation of CHF (congestive heart failure) (HCC) 04/06/2022   Polysubstance abuse (HCC) 04/06/2022    Elevated troponin 04/06/2022   Controlled type 2 diabetes mellitus with hyperglycemia (HCC) 04/06/2022   Transaminitis 04/06/2022   Hypoalbuminemia 04/06/2022   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/11/2021   Upper airway cough syndrome 12/20/2018   Cigarette smoker 11/21/2018   Asthmatic bronchitis, moderate persistent, uncomplicated 11/20/2018   DOE (dyspnea on exertion) 04/14/2016   Streptococcus pneumoniae pneumonia 04/07/2016   Acute respiratory failure with hypoxia (HCC) 04/03/2016   Elevated lactic acid level 04/03/2016   Leukocytosis 04/03/2016   New onset type 2 diabetes mellitus (HCC) 04/03/2016   Abnormal urinalysis 04/03/2016   Trichomonas infection 04/03/2016   Non compliance w medication regimen 01/12/2016   COPD without exacerbation (HCC) 12/04/2014   AKI (acute kidney injury) 03/14/2014   Acute on chronic systolic heart failure (HCC) 03/14/2014   Musculoskeletal chest pain 03/14/2014   Dyspnea 12/03/2013   Periodic health assessment, general screening, adult 10/03/2013   Mitral valve regurgitation 08/22/2013   FIBROIDS, UTERUS 05/11/2010   ANEMIA, SECONDARY TO BLOOD LOSS 05/11/2010   Asthmatic bronchitis 05/11/2010   TOBACCO USER 07/07/2009   Essential hypertension, benign 06/19/2009   MENORRHAGIA 06/19/2009   COCAINE  ABUSE, HX OF 06/19/2009   Current Medications[1] Allergies[2]   Social History   Socioeconomic History   Marital status: Divorced    Spouse name: Not on file   Number of children: Not on file   Years of education: Not on file   Highest education level: Some college, no degree  Occupational History   Not on file  Tobacco Use  Smoking status: Some Days    Current packs/day: 0.00    Average packs/day: 1 pack/day for 35.0 years (35.0 ttl pk-yrs)    Types: Cigarettes    Start date: 08/25/1984    Last attempt to quit: 08/26/2019    Years since quitting: 5.1   Smokeless tobacco: Never  Vaping Use   Vaping status: Former  Substance and  Sexual Activity   Alcohol use: No    Alcohol/week: 0.0 standard drinks of alcohol   Drug use: Not Currently    Frequency: 4.0 times per week    Types: Crack cocaine     Comment: RELAPSE 12/2016   Sexual activity: Not on file  Other Topics Concern   Not on file  Social History Narrative   Lives with son in an apartment on the third floor.  Does not work.  On disability.  Previously worked in [employer].    Education: high school.     Social Drivers of Health   Tobacco Use: High Risk (10/11/2024)   Patient History    Smoking Tobacco Use: Some Days    Smokeless Tobacco Use: Never    Passive Exposure: Not on file  Financial Resource Strain: Low Risk (09/17/2024)   Received from [facility]   Overall Financial Resource Strain (CARDIA)    How hard is it for you to pay for the very basics like food, housing, medical care, and heating?: Not very hard  Food Insecurity: Low Risk (09/17/2024)   Received from [facility]   Epic    Within the past 12 months, you worried that your food would run out before you got money to buy more: Never true    Within the past 12 months, the food you bought just didn't last and you didn't have money to get more. : Never true  Recent Concern: Food Insecurity - Food Insecurity Present (08/09/2024)   Epic    Worried About Programme Researcher, Broadcasting/film/video in the Last Year: Never true    Ran Out of Food in the Last Year: Sometimes true  Transportation Needs: No Transportation Needs (09/17/2024)   Received from [facility]    In the past 12 months, has lack of reliable transportation kept you from medical appointments, meetings, work or from getting things needed for daily living? : No  Recent Concern: Transportation Needs - Unmet Transportation Needs (08/09/2024)   Epic    Lack of Transportation (Medical): Yes    Lack of Transportation (Non-Medical): Yes  Physical Activity: Insufficiently Active (08/09/2024)   Exercise Vital Sign    Days of  Exercise per Week: 4 days    Minutes of Exercise per Session: 30 min  Stress: Stress Concern Present (08/09/2024)   [employer] of Occupational Health - Occupational Stress Questionnaire    Feeling of Stress: To some extent  Social Connections: Moderately Isolated (09/17/2024)   Received from [facility]   Social Connection and Isolation Panel    In a typical week, how many times do you talk on the phone with family, friends, or neighbors?: More than three times a week    How often do you get together with friends or relatives?: More than three times a week    How often do you attend church or religious services?: More than 4 times per year    Do you belong to any clubs or organizations such as church groups, unions, fraternal or athletic groups, or school groups?: No    How often do you  attend meetings of the clubs or organizations you belong to?: Never    Are you married, widowed, divorced, separated, never married, or living with a partner?: Widowed  Intimate Partner Violence: Not At Risk (04/05/2024)   Epic    Fear of Current or Ex-Partner: No    Emotionally Abused: No    Physically Abused: No    Sexually Abused: No  Depression (PHQ2-9): Medium Risk (10/11/2024)   Depression (PHQ2-9)    PHQ-2 Score: 10  Alcohol Screen: Not on file  Housing: Low Risk (09/17/2024)   Received from [facility]   Epic    What is your living situation today?: I have a steady place to live    Think about the place you live. Do you have problems with any of the following? Choose all that apply:: None/None on this list  Recent Concern: Housing - High Risk (08/09/2024)   Epic    Unable to Pay for Housing in the Last Year: Yes    Number of Times Moved in the Last Year: 1    Homeless in the Last Year: No  Utilities: Low Risk (09/17/2024)   Received from [facility]   Utilities    In the past 12 months has the electric, gas, oil, or water company threatened to shut off services in your home? :  No  Health Literacy: Adequate Health Literacy (05/06/2024)   B1300 Health Literacy    Frequency of need for help with medical instructions: Rarely    Physical Exam      Future Appointments  Date Time Provider Department Center  10/28/2024  3:45 PM Darlean Ozell NOVAK, MD [facility] [address]  10/29/2024 10:30 AM [facility] PA/NP [facility] None  11/05/2024  3:00 PM Fleeta Tonia Garnette LITTIE, RPH-CPP [facility] [address]  11/19/2024  1:50 PM [facility]AL WELLNESS VISIT [facility]  01/21/2025  2:45 PM [place], Delon LITTIE, DPM [facility]       Famotidine , Spironolactone , Carvedilol , Gabapentin , Buspirone ,     [1]  Current Outpatient Medications:    albuterol  (VENTOLIN  HFA) 108 (90 Base) MCG/ACT inhaler, INHALE 2 PUFFS INTO THE LUNGS EVERY 4 HOURS AS NEEDED FOR WHEEZING OR SHORTNESS OF BREATH, Disp: 17 g, Rfl: 1   allopurinol  (ZYLOPRIM ) 300 MG tablet, Take 300 mg by mouth 2 (two) times daily., Disp: , Rfl:    ammonium lactate  (LAC-HYDRIN ) 12 % lotion, Apply 1 Application topically as needed for dry skin., Disp: 400 g, Rfl: 5   atorvastatin  (LIPITOR) 40 MG tablet, Take 1 tablet (40 mg total) by mouth daily., Disp: 90 tablet, Rfl: 1   budesonide  (PULMICORT ) 0.25 MG/2ML nebulizer solution, One vial twice daily with albuterol  in nebulizer, Disp: 120 mL, Rfl: 12   busPIRone  (BUSPAR ) 5 MG tablet, Take 1 tablet (5 mg total) by mouth 2 (two) times daily., Disp: 120 tablet, Rfl: 1   carvedilol  (COREG ) 3.125 MG tablet, Take 1 tablet (3.125 mg total) by mouth 2 (two) times daily with a meal., Disp: 60 tablet, Rfl: 5   dapagliflozin  propanediol (FARXIGA ) 10 MG TABS tablet, Take 1 tablet (10 mg total) by mouth daily., Disp: 90 tablet, Rfl: 1   famotidine  (PEPCID ) 20 MG tablet, One after supper, Disp: 90 tablet, Rfl: 1   fluticasone  (FLONASE ) 50 MCG/ACT nasal spray, Place 2 sprays into both nostrils daily., Disp: 16 g, Rfl: 6   fluticasone  furoate-vilanterol (BREO ELLIPTA ) 100-25  MCG/ACT AEPB, Inhale 1 puff into the lungs daily., Disp: 1 each, Rfl: 11   furosemide  (  LASIX ) 40 MG tablet, Take 0.5 tablets (20 mg total) by mouth daily. May take an additional 20 mg as needed, Disp: , Rfl:    gabapentin  (NEURONTIN ) 300 MG capsule, Take 1 capsule (300 mg total) by mouth 2 (two) times daily., Disp: 60 capsule, Rfl: 6   ipratropium-albuterol  (DUONEB) 0.5-2.5 (3) MG/3ML SOLN, Take 3 mLs by nebulization 2 (two) times daily., Disp: 180 mL, Rfl: 1   metFORMIN  (GLUCOPHAGE -XR) 500 MG 24 hr tablet, Take 1 tablet (500 mg total) by mouth daily., Disp: 90 tablet, Rfl: 1   pantoprazole  (PROTONIX ) 40 MG tablet, TAKE 1 TABLET(40 MG) BY MOUTH DAILY 30 TO 60 MINUTES BEFORE FIRST MEAL OF THE DAY, Disp: 90 tablet, Rfl: 2   QUEtiapine  (SEROQUEL ) 100 MG tablet, Take 1 tablet (100 mg total) by mouth at bedtime., Disp: 90 tablet, Rfl: 1   Semaglutide , 1 MG/DOSE, 4 MG/3ML SOPN, Inject 1 mg as directed once a week. Start after 4 injections of the 0.5mg  dose., Disp: 3 mL, Rfl: 1   spironolactone  (ALDACTONE ) 25 MG tablet, Take 1 tablet (25 mg total) by mouth daily., Disp: 45 tablet, Rfl: 3   valsartan  (DIOVAN ) 40 MG tablet, Take 1 tablet (40 mg total) by mouth daily., Disp: , Rfl:    Accu-Chek Softclix Lancets lancets, Use as instructed. Check Blood sugars daily before breakfast (Patient not taking: Reported on 10/23/2024), Disp: 100 each, Rfl: 12   Blood Glucose Monitoring Suppl (ACCU-CHEK GUIDE) w/Device KIT, Check Blood sugars daily before breakfast (Patient not taking: Reported on 10/09/2024), Disp: 1 kit, Rfl: 0   glucose blood (ACCU-CHEK GUIDE TEST) test strip, Use as instructed. Check Blood sugars daily before breakfast (Patient not taking: Reported on 10/23/2024), Disp: 100 each, Rfl: 12   naloxone  (NARCAN ) nasal spray 4 mg/0.1 mL, Inject intranasal for overdose (Patient not taking: Reported on 10/23/2024), Disp: 1 each, Rfl: 1   Semaglutide ,0.25 or 0.5MG /DOS, (OZEMPIC , 0.25 OR 0.5 MG/DOSE,) 2 MG/3ML  SOPN, Inject 0.5 mg into the skin once a week. (Patient not taking: Reported on 10/23/2024), Disp: 3 mL, Rfl: 1   traZODone  (DESYREL ) 50 MG tablet, Take 1-2 tablets (50-100 mg total) by mouth at bedtime as needed for sleep. (Patient not taking: Reported on 10/23/2024), Disp: 60 tablet, Rfl: 0 [2] No Known Allergies

## 2024-10-28 ENCOUNTER — Other Ambulatory Visit: Payer: Self-pay

## 2024-10-28 ENCOUNTER — Encounter: Payer: Self-pay | Admitting: Internal Medicine

## 2024-10-28 ENCOUNTER — Telehealth (HOSPITAL_COMMUNITY): Payer: Self-pay

## 2024-10-28 ENCOUNTER — Telehealth (INDEPENDENT_AMBULATORY_CARE_PROVIDER_SITE_OTHER): Payer: Self-pay | Admitting: Primary Care

## 2024-10-28 ENCOUNTER — Ambulatory Visit (INDEPENDENT_AMBULATORY_CARE_PROVIDER_SITE_OTHER): Admitting: Internal Medicine

## 2024-10-28 VITALS — BP 94/59 | HR 103 | Temp 98.5°F | Ht 60.0 in | Wt 192.0 lb

## 2024-10-28 DIAGNOSIS — R058 Other specified cough: Secondary | ICD-10-CM | POA: Diagnosis not present

## 2024-10-28 DIAGNOSIS — I5022 Chronic systolic (congestive) heart failure: Secondary | ICD-10-CM

## 2024-10-28 DIAGNOSIS — J4540 Moderate persistent asthma, uncomplicated: Secondary | ICD-10-CM | POA: Diagnosis not present

## 2024-10-28 DIAGNOSIS — F17210 Nicotine dependence, cigarettes, uncomplicated: Secondary | ICD-10-CM | POA: Diagnosis not present

## 2024-10-28 MED ORDER — METHYLPREDNISOLONE ACETATE 80 MG/ML IJ SUSP
120.0000 mg | Freq: Once | INTRAMUSCULAR | Status: AC
  Administered 2024-10-28: 120 mg via INTRAMUSCULAR

## 2024-10-28 NOTE — Assessment & Plan Note (Addendum)
 Counseled re importance of smoking cessation but did not meet time criteria for separate billing    Low-dose CT lung cancer screening is recommended for patients who are 68-61 years of age with a 20+ pack-year history of smoking and who are currently smoking or quit <=15 years ago. No coughing up blood  No unintentional weight loss of > 15 pounds in the last 6 months - pt is eligible for scanning yearly until 15 y p successfully quits smoking > referred again

## 2024-10-28 NOTE — Progress Notes (Signed)
 "    Advanced Heart Failure Clinic Note   PCP: Celestia [name] SQUIBB, NP Pulmonologist: Dr. Darlean HF Cardiologist: Dr. Cherrie   HPI: Ms. Bastedo is a 59 y.o. 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  EF as low as 30-35% in 2014.   cMRI 11/14: EF 32% RV mildly dilated, no scar or infiltrative disease. Mod MR and Mod-sev TR  [facility] 8/15: ectatic coronaries (suspect d/t HTN) no significant arthrosclerosis and normal EF. No significant MR noted  Echo 8/15: EF 55-60%, mild/mod AI, grade I DD and mild MR  EF stable at 55-60% in 2019.   She was admitted in 6/23 with acute respiratory failure with hypoxia 2/2 acute on chronic systolic CHF. EF down to 30-35% on echo with normal RV, severe BAE, moderate to severe MR, moderate to severe TR and moderate to severe AI. Diuresed and GDMT titrated. R/LHC with no CAD and reduced Fick CI of 1.96.  Readmit 10/24 with COPD exacerbation in setting of noncompliance with medications and ongoing tobacco use + recent cocaine  use. Repeat echo with EF 40-45%.  Admitted 1/25 with acute respiratory failure 2/2 COPD exacerbation and influenza A. She also had acute on chronic CHF and diuresed with IV lasix .  Admitted in 6/25 with PNA. Echo down 30-35%. Mild MR/AS.   [facility] with CAP/COPD exacerbation. Briefly required bipap. Treatment included abx, steroids and breathing treatments. Blood cultures + for Enterobacterales and E. Coli. Pulmonary edema noted, given 7 days of lasix  then back to PRN.   She returns today for HF follow up. Overall feeling ok, has good days and bad. More good than bad. Her bad days are due to shortness of breath. Her lasix  was stopped after her last hospitalization, she says due to lower blood pressures. She has been retaining fluid since then. Has occasional lightheadedness.  Weight is also up. Nochest pain, near-syncope, palpitations, and dizziness. Able to perform ADLs. Compliant with all medications. Denies ETOH, drug use. Smoking about 3cig/wk.  FH: Mother deceased: Dementia, seizures, stomach cancer        Father deceased: HTN  Review of systems complete and found to be negative unless listed in HPI.    Past Medical History:  Diagnosis Date   Anemia    Arrhythmia    Asthma    CHF (congestive heart failure) (HCC)    1990s   Chronic headaches    COPD (chronic obstructive pulmonary disease) (HCC)    ??   Hypercholesteremia    Hypertension    Current Outpatient Medications  Medication Sig Dispense Refill   Accu-Chek Softclix Lancets lancets Use as instructed. Check Blood sugars daily before breakfast (Patient not taking: Reported on 10/23/2024) 100 each 12   albuterol  (VENTOLIN  HFA) 108 (90 Base) MCG/ACT inhaler INHALE 2 PUFFS INTO THE LUNGS EVERY 4 HOURS AS NEEDED FOR WHEEZING OR SHORTNESS OF BREATH 17 g 1   allopurinol  (ZYLOPRIM ) 300 MG tablet Take 300 mg by mouth 2 (two) times daily.     ammonium lactate  (LAC-HYDRIN ) 12 % lotion Apply 1 Application topically as needed for dry skin. 400 g 5   atorvastatin  (LIPITOR) 40 MG tablet Take 1 tablet (40 mg total) by mouth daily. 90 tablet 1   Blood Glucose Monitoring Suppl (ACCU-CHEK GUIDE) w/Device KIT Check Blood sugars daily before breakfast (Patient not taking: Reported on 10/09/2024) 1 kit 0   budesonide  (PULMICORT ) 0.25  MG/2ML nebulizer solution One vial twice daily with albuterol  in nebulizer 120 mL 12   busPIRone  (BUSPAR ) 5 MG tablet Take 1 tablet (5 mg total) by mouth 2 (two) times daily. 120 tablet 1   carvedilol  (COREG ) 3.125 MG tablet Take 1 tablet (3.125 mg total) by mouth 2 (two) times daily with a meal. 60 tablet 5   dapagliflozin  propanediol (FARXIGA ) 10 MG TABS tablet Take 1 tablet (10 mg total) by mouth daily. 90 tablet 1   famotidine  (PEPCID ) 20 MG tablet One after supper 90 tablet 1    fluticasone  (FLONASE ) 50 MCG/ACT nasal spray Place 2 sprays into both nostrils daily. 16 g 6   fluticasone  furoate-vilanterol (BREO ELLIPTA ) 100-25 MCG/ACT AEPB Inhale 1 puff into the lungs daily. 1 each 11   furosemide  (LASIX ) 40 MG tablet Take 0.5 tablets (20 mg total) by mouth daily. May take an additional 20 mg as needed     gabapentin  (NEURONTIN ) 300 MG capsule Take 1 capsule (300 mg total) by mouth 2 (two) times daily. 60 capsule 6   glucose blood (ACCU-CHEK GUIDE TEST) test strip Use as instructed. Check Blood sugars daily before breakfast (Patient not taking: Reported on 10/23/2024) 100 each 12   ipratropium-albuterol  (DUONEB) 0.5-2.5 (3) MG/3ML SOLN Take 3 mLs by nebulization 2 (two) times daily. 180 mL 1   metFORMIN  (GLUCOPHAGE -XR) 500 MG 24 hr tablet Take 1 tablet (500 mg total) by mouth daily. 90 tablet 1   naloxone  (NARCAN ) nasal spray 4 mg/0.1 mL Inject intranasal for overdose (Patient not taking: Reported on 10/23/2024) 1 each 1   pantoprazole  (PROTONIX ) 40 MG tablet TAKE 1 TABLET(40 MG) BY MOUTH DAILY 30 TO 60 MINUTES BEFORE FIRST MEAL OF THE DAY 90 tablet 2   QUEtiapine  (SEROQUEL ) 100 MG tablet Take 1 tablet (100 mg total) by mouth at bedtime. 90 tablet 1   Semaglutide , 1 MG/DOSE, 4 MG/3ML SOPN Inject 1 mg as directed once a week. Start after 4 injections of the 0.5mg  dose. 3 mL 1   Semaglutide ,0.25 or 0.5MG /DOS, (OZEMPIC , 0.25 OR 0.5 MG/DOSE,) 2 MG/3ML SOPN Inject 0.5 mg into the skin once a week. (Patient not taking: Reported on 10/23/2024) 3 mL 1   spironolactone  (ALDACTONE ) 25 MG tablet Take 1 tablet (25 mg total) by mouth daily. 45 tablet 3   traZODone  (DESYREL ) 50 MG tablet Take 1-2 tablets (50-100 mg total) by mouth at bedtime as needed for sleep. (Patient not taking: Reported on 10/23/2024) 60 tablet 0   valsartan  (DIOVAN ) 40 MG tablet Take 1 tablet (40 mg total) by mouth daily.     No current facility-administered medications for this visit.   Wt Readings from Last 3  Encounters:  10/23/24 87.4 kg (192 lb 9.6 oz)  10/11/24 82.7 kg (182 lb 6.4 oz)  10/09/24 86.1 kg (189 lb 12.8 oz)    LMP  (LMP Unknown)   PHYSICAL EXAM: General: Well appearing. No distress  Cardiac: JVP ~8 cm. 2/6 RUSB systolic murmur  Resp: Lung sounds clear and equal B/L Abdomen: Soft, non-distended.  Extremities: Warm and dry.  2+ BLE edema.  Neuro: A&O x3. Affect pleasant.   ASSESSMENT & PLAN:   Chronic HFrEF > HFmrEF, NICM - EF 30-35% in 2014 - Cath 8/15 showed normal cors  - Echo 6/19: EF 55-65%  - Echo 6/23: EF 30-35%, nl RV, severe BAE, mod/severe MR, mod/severe TR and mod/severe AI - Echo 10/24: EF 40-45%, RV okay, PASP 65 mmHg, mod/severe MR, mild/moderate TR, moderate AI - Echo  5/25: EF 30-35%.  - Echo 11/25  EF 45-50%, G1DD, nl RV - NYHA III. Volume up on exam. Weight up.  - Just restart lasix . Increase to 40 mg daily. BMET/pBNP today  - Stop coreg . Start bisoprol 2.5 mg daily with hypotension and recent COPD exac - Continue Farxiga  10 mg daily. No GU symptoms.  - Continue valsartan  40 mg daily. If BP remains low on Paramedicine checks, plan to lower dose. - Continue spiro 25 mg daily  2.  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 There is evidence of only mild MS by visual assessment. Mild MR 11/25 - Mod aortic regurgitation.   3.  HTN - Stable.   4. Substance abuse - UDS 5/25 + cocaine   - Denies recent cocaine  use.   5. Tobacco Abuse - Smoking 3cig/wk. Cessation advised.   6. SDOH - Living with her son.  - Now followed by paramedicine for med compliance, appreciate their assistance. Compliance has improved significantly.  7. Depression - Per PCP   Follow up in 1 month with APP  Humbert Morozov, NP 11:15 AM "

## 2024-10-28 NOTE — Assessment & Plan Note (Addendum)
 Quit smoking 11//2020  PFTs 10/2013:  FEV1 0.86 (41%), ratio 67, TLC 58%, unable to do DLCO Spiro as part of CPST 02/2014:  FEV1 1.19 (59%), ratio 72 Spiro 11/2014:  FEV1 1.08 (54%), ratio 80 - Spirometry 04/14/2016  FEV1 1.32 (68%)  Ratio 79 with nl effort indep portion of f/v loop  11/20/2018   resume symb 80 2bid x 2 week sample plus prednisone   X 6 days  then regroup with all meds in hand > did not do and as of 12/04/2018 no prednisone  taken/still smoking  - Allergy  profile 12/19/2018 >  Eos 0. /  IgE 8 rast neg  - 03/05/2021  After extensive coaching inhaler device,  effectiveness =    25% at baseline > try breztri  sample one bid x 4 week samples then ov with all meds / depomedrol 80 mg IM  And prn saba / max gerd rx - budesonide  0.25 mg bid per neb with albuterol  07/24/2024 >>>  not really much better but also still on breo up to bid as of 10/28/2024  with clear lungs > d/c BREO / max gabapentin / continue max gerd rx   Comment:  DDX of  difficult airways management almost all start with A and  include Adherence, Ace Inhibitors, Acid Reflux, Active Sinus Disease, Alpha 1 Antitripsin deficiency, Anxiety masquerading as Airways dz,  ABPA,  Allergy (esp in young), Aspiration (esp in elderly), Adverse effects of meds,  Active smoking or vaping, A bunch of PE's (a small clot burden can't cause this syndrome unless there is already severe underlying pulm or vascular dz with poor reserve) plus two Bs  = Bronchiectasis and Beta blocker use..and one C= CHF a     Bolded dxs of greatest concern are were addressed with the pt - smoking discussed separately.   Adherence is always the initial prime suspect and is a multilayered concern that requires a trust but verify approach in every patient - starting with knowing how to use medications, especially inhalers, correctly, keeping up with refills and understanding the fundamental difference between maintenance and prns vs those medications only taken for a very short  course and then stopped and not refilled.  - return with all meds in hand using a trust but verify approach to confirm accurate Medication  Reconciliation The principal here is that until we are certain that the  patients are doing what we've asked, it makes no sense to ask them to do more.   Adverse drug effects > d/c BREO due to adverse effect on upper airway and just use duonb/budesonide  for now   ? Acid (or non-acid) GERD > always difficult to exclude as up to 75% of pts in some series report no assoc GI/ Heartburn symptoms> rec continue max (24h)  acid suppression and diet restrictions/ reviewed

## 2024-10-28 NOTE — Progress Notes (Addendum)
 "  Subjective:    Patient ID: Stacey Lin, female    DOB: [birth year]   MRN: [id number]   Brief patient profile:  24 yobf  Active smoker previously seen by Dr. Corrie 12/18/14 with dyspnea on exertion.She was felt to have asthmatic bronchitis but not COPD  rec Stop spiriva  rx breo 100, take one inhalation each am proair  respiclick, 2 inhalations every 6 hrs only if you are having severe breathing issues. Work on weight loss and conditioning. Will send a note to your cardiologist to see about getting you off lisinopril  to see if this helps your dry tickling cough. You must stop smoking 100% in order to stay well.  followup with me again in 2mos to check on things> did not return.   History of Present Illness  02/12/2016: NP Follow up Office Visit: Patient presents to the office today with worsening dyspnea on exertion. She was seen at the heart failure clinic one day prior to OV   and they requested that she follow up with pulmonary. Spirometry 11/2014 showed  normal FEV1 with ? air trapping given her   FVC ? 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 She is continuing to smoke approx. 2 cigarettes daily per her history.    she has not been using her Breo inhaler daily as  Maintenance medication, but just when needed.   rec We will give you a Breo sample and new prescription for your Breo.( Maintenance inhaler) Use the Breo 1 puff twice daily. We will give you a prescription for your Pro Air Inhaler ( Rescue Inhaler) Use this every 6 hours as needed for SOB or Wheezing. Claritin ( loratadine) 1 every day for allergies Nasal Saline as needed for nasal congestion Continue weighing yourself daily per the heart failure clinic Follow up in 1 month > did  Not return        11/20/2018  Acute  ov/Stacey Lin re: worse since stopped symbicort   Chief Complaint  Patient presents with   Acute Visit    Increased SOB for the past 2 months. She gets winded walking from room to room at home. She also c/o  occ cough and wheezing. Her cough is mainly non prod.  She is using her albuterol  inhaler 2 x per wk on average.   Dyspnea:  Gradually worse x 2 months to point to doe  Room to room = MMRC3  Cough: acutely worse x 2 days / harsh esp daytime > noct  Sleeping: on back with 2 pillows SABA use: confused with which ones help and when to take  rec Plan A = Automatic = symbicort  80 Take 2 puffs first thing in am and then another 2 puffs about 12 hours later.  Work on inhaler technique  The key is to stop smoking completely before smoking completely stops you!  Plan B = Backup Only use your albuterol  (ventolin ) inhaler  Please schedule a follow up office visit in 2  weeks, call sooner if needed with all medications /inhalers/ solutions in hand so we can verify exactly what you are taking. This includes all medications from all doctors and over the counters - PLEASE separate them into two bags:  the ones you take automatically, no matter what, vs the ones you take just when you feel you need them BAG #2 is UP TO YOU  - this will really help us  help you take your medications more effectively.  - add: Prednisone  10 mg take  4 each am x 2 days,  2 each am x 2 days,  1 each am x 2 days and stop and spirometry on return        Admit date: 02/28/2024 Discharge date: 03/03/2024  Discharge Diagnoses:  Acute hypoxic respiratory failure Acute on chronic systolic CHF Pulmonary hypertension Mitral regurgitation COPD Polysubstance abuse Ongoing tobacco use Cocaine  use Type 2 diabetes mellitus Depression          07/24/2024  f/u ov/Stacey Lin re: severe cough always better in hospital (on nebs)  quit smoking Aug 2024   maint on BREO  did not  bring meds  order  LCS Chief Complaint  Patient presents with   Medication Management   Bronchitis    Breathing is unchanged since last visit   Dyspnea:  10 ft  (125 ft here with no desats) Cough: dry day >> noct  Sleeping: flat bed / 2 pillows s resp cc  SABA  use: not sure  02: none Lung cancer screening :  referred  Patient Instructions  Stop BREO  and start albuterol  2.5 mg and add  budesonide  0.25mg   twice daily  Pantoprazole  40 mg Take 30-60 min before first meal of the day and continue pepcid  20 mg after supper  Gabapentn 300 mg twice daily - not as needed Delsym  2 tsp twice daily as needed or cough  Depomedrol 120 mg IM  No mint menthol  or chocolate  My office will be contacting you by phone for referral to lung cancer screening   [phone])> not done as of 10/28/2024  - if you don't hear back from my office within one week,  please call us  back or notify us  thru MyChart and we'll address it right away.   Please schedule a follow up office visit in 4 weeks, sooner if needed  with all medications /inhalers/ solutions in hand      10/28/2024  f/u ov/Stacey Lin re:  severe cough always better in hospital  maint on breo AND NEBs   STILL  smoker  LDSCT not done as rec  Chief Complaint  Patient presents with   Asthma    Pt states breathing is worse sometimes   Dyspnea:  fines as long as avoids basement steps - not really limited on flat grade  Cough: in am's more productive clear  Sleeping: flat bed 2 pillows wakes up coughing  SABAnone use only neb  02: none   Lung cancer screening :  REFERRED     No obvious day to day or daytime variability or assoc excess/ purulent sputum or mucus plugs or hemoptysis or cp or chest tightness, subjective wheeze or overt sinus or hb symptoms.    Also denies any obvious fluctuation of symptoms with weather or environmental changes or other aggravating or alleviating factors except as outlined above   No unusual exposure hx or h/o childhood pna/ asthma or knowledge of premature birth.  Current Allergies, Complete Past Medical History, Past Surgical History, Family History, and Social History were reviewed in [employer] record.  ROS  The following are not active complaints unless  bolded Hoarseness, sore throat/globus , dysphagia, dental problems, itching, sneezing,  nasal congestion or discharge of excess mucus or purulent secretions, ear ache,   fever, chills, sweats, unintended wt loss or wt gain, classically pleuritic or exertional cp,  orthopnea pnd or arm/hand swelling  or leg swelling, presyncope, palpitations, abdominal pain, anorexia, nausea, vomiting, diarrhea  or change in bowel habits or change in bladder habits, change in stools or  change in urine, dysuria, hematuria,  rash, arthralgias, visual complaints, headache, numbness, weakness or ataxia or problems with walking or coordination,  change in mood or  memory.          Outpatient Medications Prior to Visit  Medication Sig Dispense Refill   Accu-Chek Softclix Lancets lancets Use as instructed. Check Blood sugars daily before breakfast 100 each 12   albuterol  (VENTOLIN  HFA) 108 (90 Base) MCG/ACT inhaler INHALE 2 PUFFS INTO THE LUNGS EVERY 4 HOURS AS NEEDED FOR WHEEZING OR SHORTNESS OF BREATH 17 g 1   allopurinol  (ZYLOPRIM ) 300 MG tablet Take 300 mg by mouth 2 (two) times daily.     ammonium lactate  (LAC-HYDRIN ) 12 % lotion Apply 1 Application topically as needed for dry skin. 400 g 5   atorvastatin  (LIPITOR) 40 MG tablet Take 1 tablet (40 mg total) by mouth daily. 90 tablet 1   Blood Glucose Monitoring Suppl (ACCU-CHEK GUIDE) w/Device KIT Check Blood sugars daily before breakfast 1 kit 0   budesonide  (PULMICORT ) 0.25 MG/2ML nebulizer solution One vial twice daily with albuterol  in nebulizer 120 mL 12   busPIRone  (BUSPAR ) 5 MG tablet Take 1 tablet (5 mg total) by mouth 2 (two) times daily. 120 tablet 1   carvedilol  (COREG ) 3.125 MG tablet Take 1 tablet (3.125 mg total) by mouth 2 (two) times daily with a meal. 60 tablet 5   dapagliflozin  propanediol (FARXIGA ) 10 MG TABS tablet Take 1 tablet (10 mg total) by mouth daily. 90 tablet 1   famotidine  (PEPCID ) 20 MG tablet One after supper 90 tablet 1   fluticasone   (FLONASE ) 50 MCG/ACT nasal spray Place 2 sprays into both nostrils daily. 16 g 6   furosemide  (LASIX ) 40 MG tablet Take 0.5 tablets (20 mg total) by mouth daily. May take an additional 20 mg as needed     gabapentin  (NEURONTIN ) 300 MG capsule Take 1 capsule (300 mg total) by mouth 2 (two) times daily. 60 capsule 6   glucose blood (ACCU-CHEK GUIDE TEST) test strip Use as instructed. Check Blood sugars daily before breakfast 100 each 12   ipratropium-albuterol  (DUONEB) 0.5-2.5 (3) MG/3ML SOLN Take 3 mLs by nebulization 2 (two) times daily. 180 mL 1   metFORMIN  (GLUCOPHAGE -XR) 500 MG 24 hr tablet Take 1 tablet (500 mg total) by mouth daily. 90 tablet 1   naloxone  (NARCAN ) nasal spray 4 mg/0.1 mL Inject intranasal for overdose 1 each 1   pantoprazole  (PROTONIX ) 40 MG tablet TAKE 1 TABLET(40 MG) BY MOUTH DAILY 30 TO 60 MINUTES BEFORE FIRST MEAL OF THE DAY 90 tablet 2   QUEtiapine  (SEROQUEL ) 100 MG tablet Take 1 tablet (100 mg total) by mouth at bedtime. 90 tablet 1   Semaglutide , 1 MG/DOSE, 4 MG/3ML SOPN Inject 1 mg as directed once a week. Start after 4 injections of the 0.5mg  dose. 3 mL 1   spironolactone  (ALDACTONE ) 25 MG tablet Take 1 tablet (25 mg total) by mouth daily. 45 tablet 3   traZODone  (DESYREL ) 50 MG tablet Take 1-2 tablets (50-100 mg total) by mouth at bedtime as needed for sleep. 60 tablet 0   valsartan  (DIOVAN ) 40 MG tablet Take 1 tablet (40 mg total) by mouth daily.     fluticasone  furoate-vilanterol (BREO ELLIPTA ) 100-25 MCG/ACT AEPB Inhale 1 puff into the lungs daily. 1 each 11   Semaglutide ,0.25 or 0.5MG /DOS, (OZEMPIC , 0.25 OR 0.5 MG/DOSE,) 2 MG/3ML SOPN Inject 0.5 mg into the skin once a week. 3 mL 1   No facility-administered medications  prior to visit.     Past Medical History:  Diagnosis Date   Anemia    Arrhythmia    Asthma    CHF (congestive heart failure) (HCC)    1990s   Chronic headaches    COPD (chronic obstructive pulmonary disease) (HCC)    ??    Hypercholesteremia    Hypertension             Objective:   Physical Exam   wts  10/28/2024          192  07/24/2024        173  03/05/2024        172   03/05/2021        173 09/02/2019        200  08/16/2019      192 12/19/2018        202  12/04/2018        205   11/20/18 201 lb 6.4 oz (91.4 kg)  10/30/18 197 lb (89.4 kg)  09/17/18 198 lb (89.8 kg)     Vital signs reviewed  10/28/2024  - Note at rest 02 sats  95% on RA   General appearance:    amb somber bf, slt hoarse.   HEENT : Oropharynx  clear      Nasal turbinates nl    NECK :  without  apparent JVD/ palpable Nodes/TM    LUNGS: no acc muscle use,  Nl contour chest which is clear to A and P bilaterally without cough on insp or exp maneuvers   CV:  RRR  no s3 or murmur or increase in P2, and no edema   ABD:  obese soft and nontender   MS:  Gait nl   ext warm without deformities Or obvious joint restrictions  calf tenderness, cyanosis or clubbing    SKIN: warm and dry without lesions    NEURO:  alert, approp, nl sensorium with  no motor or cerebellar deficits apparent.       Assessment & Plan:      Assessment & Plan Cigarette smoker Counseled re importance of smoking cessation but did not meet time criteria for separate billing    Low-dose CT lung cancer screening is recommended for patients who are 66-19 years of age with a 20+ pack-year history of smoking and who are currently smoking or quit <=15 years ago. No coughing up blood  No unintentional weight loss of > 15 pounds in the last 6 months - pt is eligible for scanning yearly until 3 y p successfully quits smoking > referred again    Asthmatic bronchitis, moderate persistent, uncomplicated Quit smoking 11//2020  PFTs 10/2013:  FEV1 0.86 (41%), ratio 67, TLC 58%, unable to do DLCO Spiro as part of CPST 02/2014:  FEV1 1.19 (59%), ratio 72 Spiro 11/2014:  FEV1 1.08 (54%), ratio 80 - Spirometry 04/14/2016  FEV1 1.32 (68%)  Ratio 79 with nl effort indep  portion of f/v loop  11/20/2018   resume symb 80 2bid x 2 week sample plus prednisone   X 6 days  then regroup with all meds in hand > did not do and as of 12/04/2018 no prednisone  taken/still smoking  - Allergy  profile 12/19/2018 >  Eos 0. /  IgE 8 rast neg  - 03/05/2021  After extensive coaching inhaler device,  effectiveness =    25% at baseline > try breztri  sample one bid x 4 week samples then ov with all meds / depomedrol 80 mg IM  And prn saba / max gerd rx - budesonide  0.25 mg bid per neb with albuterol  07/24/2024 >>>  not really much better but also still on breo up to bid as of 10/28/2024  with clear lungs > d/c BREO / max gabapentin / continue max gerd rx   Comment:  DDX of  difficult airways management almost all start with A and  include Adherence, Ace Inhibitors, Acid Reflux, Active Sinus Disease, Alpha 1 Antitripsin deficiency, Anxiety masquerading as Airways dz,  ABPA,  Allergy (esp in young), Aspiration (esp in elderly), Adverse effects of meds,  Active smoking or vaping, A bunch of PE's (a small clot burden can't cause this syndrome unless there is already severe underlying pulm or vascular dz with poor reserve) plus two Bs  = Bronchiectasis and Beta blocker use..and one C= CHF a     Bolded dxs of greatest concern are were addressed with the pt - smoking discussed separately.   Adherence is always the initial prime suspect and is a multilayered concern that requires a trust but verify approach in every patient - starting with knowing how to use medications, especially inhalers, correctly, keeping up with refills and understanding the fundamental difference between maintenance and prns vs those medications only taken for a very short course and then stopped and not refilled.  - return with all meds in hand using a trust but verify approach to confirm accurate Medication  Reconciliation The principal here is that until we are certain that the  patients are doing what we've asked, it makes no  sense to ask them to do more.   Adverse drug effects > d/c BREO due to adverse effect on upper airway and just use duonb/budesonide  for now   ? Acid (or non-acid) GERD > always difficult to exclude as up to 75% of pts in some series report no assoc GI/ Heartburn symptoms> rec continue max (24h)  acid suppression and diet restrictions/ reviewed     Upper airway cough syndrome Onset Nov 2019 while on entresto  - Allergy  profile 12/19/2018 >  Eos 0.5/  IgE  8  RAST neg  - d/c all mints 12/19/2018  And rx tessilon  - 08/16/19  gabapentin  100 qid > slt better 09/02/2019  - 09/02/2019 rec change gabapentin  to 300 tid and consider stopping entresto > stopped ? 12/26/19  > improved - 03/05/2021 flare of cough off gerd rx > resumed gerd rx / depomedrol 80 mg IM  - 07/24/2024 flared spring of 2025 > restart gabapentin  300 mg bid with max rx for gerd    Titrate gabapentin  as high as 1200 mg or whatever dose controls her cough  >>> also added depomedrol 120 mg IM  in case of component of Th-2 driven upper or lower airways inflammation (if cough responds short term only to relapse before return while will on full rx for uacs (as above), then that would point to allergic rhinitis/ asthma or eos bronchitis as alternative dx)           Each maintenance medication was reviewed in detail including emphasizing most importantly the difference between maintenance and prns and under what circumstances the prns are to be triggered using an action plan format where appropriate.  Total time for H and P, chart review, counseling, reviewing dpi/ neb  device(s) and generating customized AVS unique to this office visit / same day charting = 45 min      .     AVS  Patient Instructions  My office will be  contacting you by phone for referral to lung cancer screening   [phone]) - if you don't hear back from my office within one week,  please call us  back or notify us  thru MyChart and we'll address it right away.      Plan A = Automatic = Always=    BUDESONIDE  WITH WITH ALBUTEROL  FIRST THING IN AM AND ABOUT 12 HOURS LATER  and increase the gabapentin  to 3 x daily and if not effective for cough increase to 4 x daily   Continue Pantoprazole  (protonix ) 40 mg   Take  30-60 min before first meal of the day and Pepcid  (famotidine )  20 mg after supper until return to office - this is the best way to tell whether stomach acid is contributing to your problem.     Please schedule a follow up visit in 3 months but call sooner if needed          Ozell America, MD 10/28/2024                  "

## 2024-10-28 NOTE — Telephone Encounter (Signed)
 Copied from CRM [id number]. Topic: General - Other >> Oct 28, 2024 11:34 AM Viola FALCON wrote: *2nd Request* Patient requesting call from [name] Bohr regarding a personal problem she would not disclose any more information. She said it's important for her to call her at [phone] as soon as possible.

## 2024-10-28 NOTE — Patient Instructions (Addendum)
 My office will be contacting you by phone for referral to lung cancer screening   [phone]) - if you don't hear back from my office within one week,  please call us  back or notify us  thru MyChart and we'll address it right away.     Plan A = Automatic = Always=    BUDESONIDE  WITH WITH ALBUTEROL  FIRST THING IN AM AND ABOUT 12 HOURS LATER  and increase the gabapentin  to 3 x daily and if not effective for cough increase to 4 x daily   Continue Pantoprazole  (protonix ) 40 mg   Take  30-60 min before first meal of the day and Pepcid  (famotidine )  20 mg after supper until return to office - this is the best way to tell whether stomach acid is contributing to your problem.     Please schedule a follow up visit in 3 months but call sooner if needed

## 2024-10-28 NOTE — Telephone Encounter (Signed)
 Called to confirm/remind patient of their appointment at the [facility] on 10/29/24 10:30.   Appointment:   [x] Confirmed  [] Left mess   [] No answer/No voice mail  [] VM Full/unable to leave message  [] Phone not in service  Patient reminded to bring all medications and/or complete list.  Confirmed patient has transportation. Gave directions, instructed to utilize valet parking.

## 2024-10-28 NOTE — Assessment & Plan Note (Addendum)
 Onset Nov 2019 while on entresto  - Allergy  profile 12/19/2018 >  Eos 0.5/  IgE  8  RAST neg  - d/c all mints 12/19/2018  And rx tessilon  - 08/16/19  gabapentin  100 qid > slt better 09/02/2019  - 09/02/2019 rec change gabapentin  to 300 tid and consider stopping entresto > stopped ? 12/26/19  > improved - 03/05/2021 flare of cough off gerd rx > resumed gerd rx / depomedrol 80 mg IM  - 07/24/2024 flared spring of 2025 > restart gabapentin  300 mg bid with max rx for gerd    Titrate gabapentin  as high as 1200 mg or whatever dose controls her cough  >>> also added depomedrol 120 mg IM  in case of component of Th-2 driven upper or lower airways inflammation (if cough responds short term only to relapse before return while will on full rx for uacs (as above), then that would point to allergic rhinitis/ asthma or eos bronchitis as alternative dx)           Each maintenance medication was reviewed in detail including emphasizing most importantly the difference between maintenance and prns and under what circumstances the prns are to be triggered using an action plan format where appropriate.  Total time for H and P, chart review, counseling, reviewing dpi/ neb  device(s) and generating customized AVS unique to this office visit / same day charting = 45 min      .

## 2024-10-29 ENCOUNTER — Ambulatory Visit (HOSPITAL_COMMUNITY)
Admission: RE | Admit: 2024-10-29 | Discharge: 2024-10-29 | Disposition: A | Discharge status: Home / Self Care | Source: Ambulatory Visit | Attending: Cardiology | Admitting: Cardiology

## 2024-10-29 ENCOUNTER — Encounter (HOSPITAL_COMMUNITY): Payer: Self-pay

## 2024-10-29 ENCOUNTER — Other Ambulatory Visit: Payer: Self-pay

## 2024-10-29 ENCOUNTER — Ambulatory Visit (HOSPITAL_COMMUNITY): Payer: Self-pay | Admitting: Cardiology

## 2024-10-29 VITALS — BP 97/68 | HR 96 | Ht 60.0 in | Wt 192.6 lb

## 2024-10-29 DIAGNOSIS — I5020 Unspecified systolic (congestive) heart failure: Secondary | ICD-10-CM

## 2024-10-29 DIAGNOSIS — I38 Endocarditis, valve unspecified: Secondary | ICD-10-CM

## 2024-10-29 DIAGNOSIS — I110 Hypertensive heart disease with heart failure: Secondary | ICD-10-CM | POA: Insufficient documentation

## 2024-10-29 DIAGNOSIS — Z8249 Family history of ischemic heart disease and other diseases of the circulatory system: Secondary | ICD-10-CM | POA: Insufficient documentation

## 2024-10-29 DIAGNOSIS — I082 Rheumatic disorders of both aortic and tricuspid valves: Secondary | ICD-10-CM | POA: Insufficient documentation

## 2024-10-29 DIAGNOSIS — I10 Essential (primary) hypertension: Secondary | ICD-10-CM | POA: Diagnosis not present

## 2024-10-29 DIAGNOSIS — Z720 Tobacco use: Secondary | ICD-10-CM

## 2024-10-29 DIAGNOSIS — Z79899 Other long term (current) drug therapy: Secondary | ICD-10-CM | POA: Insufficient documentation

## 2024-10-29 DIAGNOSIS — I428 Other cardiomyopathies: Secondary | ICD-10-CM | POA: Diagnosis not present

## 2024-10-29 DIAGNOSIS — J4489 Other specified chronic obstructive pulmonary disease: Secondary | ICD-10-CM | POA: Diagnosis not present

## 2024-10-29 DIAGNOSIS — I5042 Chronic combined systolic (congestive) and diastolic (congestive) heart failure: Secondary | ICD-10-CM | POA: Insufficient documentation

## 2024-10-29 DIAGNOSIS — F17210 Nicotine dependence, cigarettes, uncomplicated: Secondary | ICD-10-CM | POA: Insufficient documentation

## 2024-10-29 DIAGNOSIS — F32A Depression, unspecified: Secondary | ICD-10-CM | POA: Insufficient documentation

## 2024-10-29 DIAGNOSIS — I5022 Chronic systolic (congestive) heart failure: Secondary | ICD-10-CM

## 2024-10-29 LAB — PRO BRAIN NATRIURETIC PEPTIDE: Pro Brain Natriuretic Peptide: 64.2 pg/mL

## 2024-10-29 LAB — BASIC METABOLIC PANEL WITH GFR
Anion gap: 10 (ref 5–15)
BUN: 35 mg/dL — ABNORMAL HIGH (ref 6–20)
CO2: 30 mmol/L (ref 22–32)
Calcium: 9.9 mg/dL (ref 8.9–10.3)
Chloride: 98 mmol/L (ref 98–111)
Creatinine, Ser: 1.4 mg/dL — ABNORMAL HIGH (ref 0.44–1.00)
GFR, Estimated: 43 mL/min — ABNORMAL LOW
Glucose, Bld: 168 mg/dL — ABNORMAL HIGH (ref 70–99)
Potassium: 4.6 mmol/L (ref 3.5–5.1)
Sodium: 138 mmol/L (ref 135–145)

## 2024-10-29 MED ORDER — FUROSEMIDE 40 MG PO TABS: 40.0000 mg | ORAL_TABLET | Freq: Every day | ORAL | 3 refills | Status: AC

## 2024-10-29 MED ORDER — BISOPROLOL FUMARATE 5 MG PO TABS: 2.5000 mg | ORAL_TABLET | Freq: Every day | ORAL | 11 refills | Status: AC

## 2024-10-29 NOTE — Telephone Encounter (Signed)
 Will forward to provider

## 2024-10-29 NOTE — Patient Instructions (Signed)
 STOP Carvedilol   START Bisoprolol  2.5 mg ( 1/2 Tab) daily.  CHANGE Lasix  to 40 mg daily.  Labs done today, your results will be available in MyChart, we will contact you for abnormal readings.  Your physician recommends that you schedule a follow-up appointment in: 1 month.  If you have any questions or concerns before your next appointment please send us  a message through [place] or call our office at [phone].    TO LEAVE A MESSAGE FOR THE NURSE SELECT OPTION 2, PLEASE LEAVE A MESSAGE INCLUDING: YOUR NAME DATE OF BIRTH CALL BACK NUMBER REASON FOR CALL**this is important as we prioritize the call backs  YOU WILL RECEIVE A CALL BACK THE SAME DAY AS LONG AS YOU CALL BEFORE 4:00 PM  At the [facility], you and your health needs are our priority. As part of our continuing mission to provide you with exceptional heart care, we have created designated Provider Care Teams. These Care Teams include your primary Cardiologist (physician) and Advanced Practice Providers (APPs- Physician Assistants and Nurse Practitioners) who all work together to provide you with the care you need, when you need it.   You may see any of the following providers on your designated Care Team at your next follow up: Dr Toribio Fuel Dr Ezra Shuck Dr. Morene Brownie Greig Mosses, NP Caffie Shed, [place] [facility] [place], [place] Beckey Coe, NP Jordan Lee, NP Ellouise Class, NP Tinnie Redman, PharmD Jaun Bash, PharmD   Please be sure to bring in all your medications bottles to every appointment.    Thank you for choosing [facility]

## 2024-10-30 ENCOUNTER — Other Ambulatory Visit (HOSPITAL_COMMUNITY): Payer: Self-pay | Admitting: Emergency Medicine

## 2024-10-30 ENCOUNTER — Other Ambulatory Visit (INDEPENDENT_AMBULATORY_CARE_PROVIDER_SITE_OTHER): Payer: Self-pay | Admitting: Primary Care

## 2024-10-30 NOTE — Progress Notes (Signed)
 Could not complete visit due to missing new med and pt had to leave to run an errand.  I will stop by later today to complete med rec.    Mary Sharps, EMT-Paramedic [phone] 10/30/2024

## 2024-10-30 NOTE — Progress Notes (Signed)
 Paramedicine Encounter    Patient ID: Stacey Lin, female    DOB: 1965-12-30, 59 y.o.   MRN: [id number]   Complaints NONE  Assessment A&O x 4, skin W&D w/ good color  Denies chest pain. Baseline SOB when she climbs stairs.  Compliance with meds YES  Pill box filled x 2 weeks  Refills needed Famotidine , valsartan , buspar   Meds changes since last visit Stopped Carvedilol  and replaced by Bisoprolol , Furosemide  40mg  daily   Social changes NONE   BP 100/60 (BP Location: Right Arm, Patient Position: Sitting, Cuff Size: Normal)   Pulse 96   Resp 16   Wt 192 lb 12.8 oz (87.5 kg)   LMP  (LMP Unknown)   BMI 37.65 kg/m  Weight yesterday- Last visit weight-192.6lb  Picked up pt's Bisoprolol  at [facility] which take the place of carvedilol .  Decontructed pill box to reflect med changes.  (Added Bisoprolol  and increased Furosemide  to 40mg  daily) Pill box filled x 2 weeks.  Refills called in.  ACTION: Home visit completed  [name] [phone] 10/30/2024  Patient Care Team: Celestia [name] SQUIBB, NP as PCP - General (Internal Medicine)  Patient Active Problem List   Diagnosis Date Noted   Low blood pressure, not hypotension (required pausing some of her GDMT) 04/18/2024   Pruritic intertrigo 04/18/2024   Hot flashes due to menopause 04/18/2024   Constipation 04/18/2024   Acute respiratory distress 04/05/2024   Lumbar radiculopathy 03/13/2024   CHF (congestive heart failure) (HCC) 02/29/2024   COPD with acute exacerbation (HCC) 02/28/2024   Respiratory failure with hypoxia (HCC) 11/21/2023   COPD exacerbation (HCC) 07/25/2023   Nonrheumatic aortic valve insufficiency    Elevated LFTs 04/08/2022   Acute metabolic encephalopathy 04/08/2022   Depressed mood 04/08/2022   Headache 04/07/2022   Obesity (BMI 30-39.9) 04/07/2022   Acute exacerbation of CHF (congestive heart failure) (HCC) 04/06/2022   Polysubstance abuse (HCC) 04/06/2022   Elevated troponin  04/06/2022   Controlled type 2 diabetes mellitus with hyperglycemia (HCC) 04/06/2022   Transaminitis 04/06/2022   Hypoalbuminemia 04/06/2022   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/11/2021   Upper airway cough syndrome 12/20/2018   Cigarette smoker 11/21/2018   Asthmatic bronchitis, moderate persistent, uncomplicated 11/20/2018   DOE (dyspnea on exertion) 04/14/2016   Streptococcus pneumoniae pneumonia 04/07/2016   Acute respiratory failure with hypoxia (HCC) 04/03/2016   Elevated lactic acid level 04/03/2016   Leukocytosis 04/03/2016   New onset type 2 diabetes mellitus (HCC) 04/03/2016   Abnormal urinalysis 04/03/2016   Trichomonas infection 04/03/2016   Non compliance w medication regimen 01/12/2016   COPD without exacerbation (HCC) 12/04/2014   AKI (acute kidney injury) 03/14/2014   Acute on chronic systolic heart failure (HCC) 03/14/2014   Musculoskeletal chest pain 03/14/2014   Dyspnea 12/03/2013   Periodic health assessment, general screening, adult 10/03/2013   Mitral valve regurgitation 08/22/2013   FIBROIDS, UTERUS 05/11/2010   ANEMIA, SECONDARY TO BLOOD LOSS 05/11/2010   Asthmatic bronchitis 05/11/2010   TOBACCO USER 07/07/2009   Essential hypertension, benign 06/19/2009   MENORRHAGIA 06/19/2009   COCAINE  ABUSE, HX OF 06/19/2009   Current Medications[1] Allergies[2]   Social History   Socioeconomic History   Marital status: Divorced    Spouse name: Not on file   Number of children: Not on file   Years of education: Not on file   Highest education level: Some college, no degree  Occupational History   Not on file  Tobacco Use   Smoking status: Some Days  Current packs/day: 0.00    Average packs/day: 1 pack/day for 35.0 years (35.0 ttl pk-yrs)    Types: Cigarettes    Start date: 08/25/1984    Last attempt to quit: 08/26/2019    Years since quitting: 5.1   Smokeless tobacco: Never   Tobacco comments:    Smokes no more than 3 a day. 10/28/2024  Vaping  Use   Vaping status: Former  Substance and Sexual Activity   Alcohol use: No    Alcohol/week: 0.0 standard drinks of alcohol   Drug use: Not Currently    Frequency: 4.0 times per week    Types: Crack cocaine     Comment: RELAPSE 12/2016   Sexual activity: Not on file  Other Topics Concern   Not on file  Social History Narrative   Lives with son in an apartment on the third floor.  Does not work.  On disability.  Previously worked in [employer].    Education: high school.     Social Drivers of Health   Tobacco Use: High Risk (10/29/2024)   Patient History    Smoking Tobacco Use: Some Days    Smokeless Tobacco Use: Never    Passive Exposure: Not on file  Financial Resource Strain: Low Risk (09/17/2024)   Received from [facility]   Overall Financial Resource Strain (CARDIA)    How hard is it for you to pay for the very basics like food, housing, medical care, and heating?: Not very hard  Food Insecurity: Low Risk (09/17/2024)   Received from [facility]   Epic    Within the past 12 months, you worried that your food would run out before you got money to buy more: Never true    Within the past 12 months, the food you bought just didn't last and you didn't have money to get more. : Never true  Recent Concern: Food Insecurity - Food Insecurity Present (08/09/2024)   Epic    Worried About Programme Researcher, Broadcasting/film/video in the Last Year: Never true    Ran Out of Food in the Last Year: Sometimes true  Transportation Needs: No Transportation Needs (09/17/2024)   Received from [facility]    In the past 12 months, has lack of reliable transportation kept you from medical appointments, meetings, work or from getting things needed for daily living? : No  Recent Concern: Transportation Needs - Unmet Transportation Needs (08/09/2024)   Epic    Lack of Transportation (Medical): Yes    Lack of Transportation (Non-Medical): Yes  Physical Activity: Insufficiently Active  (08/09/2024)   Exercise Vital Sign    Days of Exercise per Week: 4 days    Minutes of Exercise per Session: 30 min  Stress: Stress Concern Present (08/09/2024)   [employer] of Occupational Health - Occupational Stress Questionnaire    Feeling of Stress: To some extent  Social Connections: Moderately Isolated (09/17/2024)   Received from [facility]   Social Connection and Isolation Panel    In a typical week, how many times do you talk on the phone with family, friends, or neighbors?: More than three times a week    How often do you get together with friends or relatives?: More than three times a week    How often do you attend church or religious services?: More than 4 times per year    Do you belong to any clubs or organizations such as church groups, unions, fraternal or athletic groups, or school groups?:  No    How often do you attend meetings of the clubs or organizations you belong to?: Never    Are you married, widowed, divorced, separated, never married, or living with a partner?: Widowed  Intimate Partner Violence: Not At Risk (04/05/2024)   Epic    Fear of Current or Ex-Partner: No    Emotionally Abused: No    Physically Abused: No    Sexually Abused: No  Depression (PHQ2-9): Medium Risk (10/11/2024)   Depression (PHQ2-9)    PHQ-2 Score: 10  Alcohol Screen: Not on file  Housing: Low Risk (09/17/2024)   Received from [facility]   Epic    What is your living situation today?: I have a steady place to live    Think about the place you live. Do you have problems with any of the following? Choose all that apply:: None/None on this list  Recent Concern: Housing - High Risk (08/09/2024)   Epic    Unable to Pay for Housing in the Last Year: Yes    Number of Times Moved in the Last Year: 1    Homeless in the Last Year: No  Utilities: Low Risk (09/17/2024)   Received from [facility]   Utilities    In the past 12 months has the electric, gas, oil, or water company  threatened to shut off services in your home? : No  Health Literacy: Adequate Health Literacy (05/06/2024)   B1300 Health Literacy    Frequency of need for help with medical instructions: Rarely    Physical Exam      Future Appointments  Date Time Provider Department Center  11/05/2024  3:00 PM Fleeta Tonia Garnette LITTIE, RPH-CPP [facility] [address]  11/19/2024  1:50 PM [facility]NUAL WELLNESS VISIT [facility]  11/29/2024  3:30 PM [facility] PA/NP [facility] None  01/21/2025  2:45 PM Gaynel Delon LITTIE, DPM [facility]  02/17/2025  3:45 PM Darlean, Ozell NOVAK, MD [facility] [address]          [1]  Current Outpatient Medications:    albuterol  (VENTOLIN  HFA) 108 (90 Base) MCG/ACT inhaler, INHALE 2 PUFFS INTO THE LUNGS EVERY 4 HOURS AS NEEDED FOR WHEEZING OR SHORTNESS OF BREATH, Disp: 17 g, Rfl: 1   allopurinol  (ZYLOPRIM ) 300 MG tablet, Take 300 mg by mouth 2 (two) times daily., Disp: , Rfl:    ammonium lactate  (LAC-HYDRIN ) 12 % lotion, Apply 1 Application topically as needed for dry skin., Disp: 400 g, Rfl: 5   atorvastatin  (LIPITOR) 40 MG tablet, Take 1 tablet (40 mg total) by mouth daily., Disp: 90 tablet, Rfl: 1   bisoprolol  (ZEBETA ) 5 MG tablet, Take 0.5 tablets (2.5 mg total) by mouth daily., Disp: 15 tablet, Rfl: 11   budesonide  (PULMICORT ) 0.25 MG/2ML nebulizer solution, One vial twice daily with albuterol  in nebulizer, Disp: 120 mL, Rfl: 12   busPIRone  (BUSPAR ) 5 MG tablet, Take 1 tablet (5 mg total) by mouth 2 (two) times daily., Disp: 120 tablet, Rfl: 1   dapagliflozin  propanediol (FARXIGA ) 10 MG TABS tablet, Take 1 tablet (10 mg total) by mouth daily., Disp: 90 tablet, Rfl: 1   famotidine  (PEPCID ) 20 MG tablet, One after supper, Disp: 90 tablet, Rfl: 1   fluticasone  (FLONASE ) 50 MCG/ACT nasal spray, Place 2 sprays into both nostrils daily., Disp: 16 g, Rfl: 6   furosemide  (LASIX ) 40 MG tablet, Take 1 tablet (40 mg total) by mouth daily., Disp: 90 tablet, Rfl: 3    gabapentin  (NEURONTIN ) 300 MG capsule,  Take 1 capsule (300 mg total) by mouth 2 (two) times daily., Disp: 60 capsule, Rfl: 6   ipratropium-albuterol  (DUONEB) 0.5-2.5 (3) MG/3ML SOLN, Take 3 mLs by nebulization 2 (two) times daily., Disp: 180 mL, Rfl: 1   metFORMIN  (GLUCOPHAGE -XR) 500 MG 24 hr tablet, Take 1 tablet (500 mg total) by mouth daily., Disp: 90 tablet, Rfl: 1   naloxone  (NARCAN ) nasal spray 4 mg/0.1 mL, Inject intranasal for overdose (Patient taking differently: Inject intranasal for overdose), Disp: 1 each, Rfl: 1   pantoprazole  (PROTONIX ) 40 MG tablet, TAKE 1 TABLET(40 MG) BY MOUTH DAILY 30 TO 60 MINUTES BEFORE FIRST MEAL OF THE DAY, Disp: 90 tablet, Rfl: 2   QUEtiapine  (SEROQUEL ) 100 MG tablet, Take 1 tablet (100 mg total) by mouth at bedtime., Disp: 90 tablet, Rfl: 1   Semaglutide , 1 MG/DOSE, 4 MG/3ML SOPN, Inject 1 mg as directed once a week. Start after 4 injections of the 0.5mg  dose., Disp: 3 mL, Rfl: 1   spironolactone  (ALDACTONE ) 25 MG tablet, Take 1 tablet (25 mg total) by mouth daily., Disp: 45 tablet, Rfl: 3   valsartan  (DIOVAN ) 40 MG tablet, Take 1 tablet (40 mg total) by mouth daily., Disp: , Rfl:    Accu-Chek Softclix Lancets lancets, Use as instructed. Check Blood sugars daily before breakfast (Patient not taking: Reported on 10/30/2024), Disp: 100 each, Rfl: 12   Blood Glucose Monitoring Suppl (ACCU-CHEK GUIDE) w/Device KIT, Check Blood sugars daily before breakfast (Patient not taking: Reported on 10/30/2024), Disp: 1 kit, Rfl: 0   glucose blood (ACCU-CHEK GUIDE TEST) test strip, Use as instructed. Check Blood sugars daily before breakfast (Patient not taking: Reported on 10/30/2024), Disp: 100 each, Rfl: 12   traZODone  (DESYREL ) 50 MG tablet, Take 1-2 tablets (50-100 mg total) by mouth at bedtime as needed for sleep. (Patient not taking: Reported on 10/30/2024), Disp: 60 tablet, Rfl: 0 [2] No Known Allergies

## 2024-10-31 ENCOUNTER — Telehealth (HOSPITAL_COMMUNITY): Payer: Self-pay | Admitting: Emergency Medicine

## 2024-10-31 NOTE — Telephone Encounter (Signed)
 Called and requested refills on Valsartan , Buspar  and Famotidine .  These will be transferred from [facility] on [address] to [facility] on [address] in [place].  The should be ready for pick up on Tuesday of next week.    Mary Sharps, EMT-Paramedic [phone] 10/31/2024

## 2024-11-04 NOTE — Progress Notes (Unsigned)
 "   S:     No chief complaint on file.  59 y.o. female who presents for diabetes evaluation, education, and management. Patient arrives in good spirits and presents without any assistance.    Patient was referred and last seen by Primary Care Provider, Stacey Lin, on ***. Last saw cardiology 10/29/2024 - labs stable, BG 168, instructed to stop Coreg  and start bisoprolol  due to hypotension. Now followed by paramedicine for med compliance.   PMH is significant for T2DM, HTN, mitral valve regurgitation, non-obstructive cardiomyopathy/HFrEF (latest EF was 40-45% on 09/05/24 prior was 30-35% on Echo 02/29/24), COPD w/ hx of exacerbation (last hospitalization in 03/2024), lumbar radiculopathy, former tobacco use, insomnia. Admitted with COPD exacerbation, community acquired pneumonia, and gram negative bacteremia 09/16/24 to 09/18/24. A1C in hospital 6.5%  Last saw pharmacy 09/13/24 - increased Ozempic  to 0.5 mg weekly for 4 weeks then to increase to 1 mg weekly. Denied any NV, abdominal pain. No changes in vision since adding Ozempic . Microalbumin/creatinine ratio 107 mg/g. Last A1C 6.3% on 07/12/2024.  Family/Social History:  -Fhx: HTN, allergies, asthma, heart disease -Tobacco: former smoker (quit in 2020) -Alcohol: none reported  Current diabetes medications include: Farxiga  10 mg daily, metformin  500 mg XR daily, Ozempic  0.5 or 1 mg weekly.  Current hypertension medications include: bisoprolol  2.5 mg daily, spironolactone  25 mg daily, valsartan  40 mg daily **also takes Lasix  40 mg daily for fluid control Current hyperlipidemia medications include: atorvastatin  40 mg daily   Patient reports adherence to taking all medications as prescribed.   Insurance coverage: [employer]  Patient denies hypoglycemic events.  Reported home fasting blood sugars: none reported   Reported 2 hour post-meal/random blood sugars: none reported.  Patient *** nocturia (nighttime urination).  Patient  reports neuropathy (nerve pain). Patient denies visual changes. Patient reports self foot exams.   Patient reported dietary habits:  -Admits to snacking throughout the day and at bedtime   Patient-reported exercise habits:  -Limited by dyspnea, CHF hx  O:   Lab Results  Component Value Date   HGBA1C 6.3 07/12/2024   There were no vitals filed for this visit.  Lipid Panel     Component Value Date/Time   CHOL 146 07/12/2024 1036   TRIG 62 07/12/2024 1036   HDL 77 07/12/2024 1036   CHOLHDL 1.9 07/12/2024 1036   CHOLHDL 2.5 07/26/2023 0254   VLDL 9 07/26/2023 0254   LDLCALC 56 07/12/2024 1036    Clinical Atherosclerotic Cardiovascular Disease (ASCVD): No  The 10-year ASCVD risk score (Arnett DK, et al., 2019) is: 9.4%   Values used to calculate the score:     Age: 50 years     Clinically relevant sex: Female     Is Non-Hispanic African American: Yes     Diabetic: Yes     Tobacco smoker: Yes     Systolic Blood Pressure: 100 mmHg     Is BP treated: Yes     HDL Cholesterol: 77 mg/dL     Total Cholesterol: 146 mg/dL   Patient is participating in a Managed Medicaid Plan: No   A/P: Diabetes longstanding currently controlled with A1c at goal. Patient is is not symptomatic from a hypoglycemia standpoint but is able to verbalize appropriate hypoglycemia management plan. She does have some polyuria and longstanding neuropathy symptoms. However, her polyuria is also due to fluid intake and loop diuretic use. Additionally, she has no changes to her baseline neuropathy symptoms. Medication adherence appears to be appropriate. She is tolerating the  Ozempic  well. We will increase to the next dose.  -Continued metformin  and Farxiga  for now. May remove metformin  and continue with Farxiga  + Ozempic  in the future should she develop any hypoglycemia.  -INCREASE Ozempic  to -Extensively discussed pathophysiology of diabetes, recommended lifestyle interventions, dietary effects on blood sugar  control.  -Counseled on s/sx of and management of hypoglycemia.  -Next A1c anticipated 09/2024.  -Urine microalbumin to creatinine ratio elevated, 107 mg/g  ASCVD risk - primary prevention in patient with diabetes. Last LDL is at goal. high intensity statin indicated.  -Continued atorvastatin  40 mg.   Hypertension longstanding currently controlled with last OV BP at goal and several home goal readings via paramedicine. Blood pressure goal of <130/80 mmHg. Medication adherence reported. -Continued current regimen.  Written patient instructions provided. Patient verbalized understanding of treatment plan.  Total time in face to face counseling 45 minutes.    Follow-up:  Pharmacist in 1 month.  Stacey Lin, PharmD, Stacey Lin, CPP Clinical Pharmacist [facility] [phone]   "

## 2024-11-05 ENCOUNTER — Ambulatory Visit: Admitting: Pharmacist

## 2024-11-06 ENCOUNTER — Telehealth (HOSPITAL_COMMUNITY): Payer: Self-pay | Admitting: Emergency Medicine

## 2024-11-06 ENCOUNTER — Other Ambulatory Visit (HOSPITAL_COMMUNITY): Payer: Self-pay | Admitting: Emergency Medicine

## 2024-11-06 NOTE — Telephone Encounter (Signed)
 Reached out to [facility] on [address] and had prescriptions for Quetiapine , Spironolactone  and Allopurinol  transferred from the [facility] on [address] to this location.    Mary Sharps, EMT-Paramedic [phone] 11/06/2024

## 2024-11-06 NOTE — Progress Notes (Signed)
 Paramedicine Encounter    Patient ID: Stacey Lin, female    DOB: 22-Feb-1966, 59 y.o.   MRN: [id number]   Complaints NONE  Assessment A&O x 4, skin W&D w/ good color.  Denies chest pain or SOB.  Lung sounds clear bilat.  No edema noted.    Compliance with meds YES  Pill box filled x 2 weeks  Refills needed  Quetiapine   Meds changes since last visit NONE    Social changes NONE   BP 110/70 (BP Location: Right Arm, Patient Position: Sitting, Cuff Size: Normal)   Pulse 78   Resp 16   Wt 175 lb 9.6 oz (79.7 kg)   LMP  (LMP Unknown)   BMI 34.29 kg/m  Weight yesterday-194.8lb Last visit weight-192.8  ACTION: Home visit completed  [name] [phone] 11/06/2024  Patient Care Team: Celestia [name] SQUIBB, NP as PCP - General (Internal Medicine)  Patient Active Problem List   Diagnosis Date Noted   Low blood pressure, not hypotension (required pausing some of her GDMT) 04/18/2024   Pruritic intertrigo 04/18/2024   Hot flashes due to menopause 04/18/2024   Constipation 04/18/2024   Acute respiratory distress 04/05/2024   Lumbar radiculopathy 03/13/2024   CHF (congestive heart failure) (HCC) 02/29/2024   COPD with acute exacerbation (HCC) 02/28/2024   Respiratory failure with hypoxia (HCC) 11/21/2023   COPD exacerbation (HCC) 07/25/2023   Nonrheumatic aortic valve insufficiency    Elevated LFTs 04/08/2022   Acute metabolic encephalopathy 04/08/2022   Depressed mood 04/08/2022   Headache 04/07/2022   Obesity (BMI 30-39.9) 04/07/2022   Acute exacerbation of CHF (congestive heart failure) (HCC) 04/06/2022   Polysubstance abuse (HCC) 04/06/2022   Elevated troponin 04/06/2022   Controlled type 2 diabetes mellitus with hyperglycemia (HCC) 04/06/2022   Transaminitis 04/06/2022   Hypoalbuminemia 04/06/2022   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/11/2021   Upper airway cough syndrome 12/20/2018   Cigarette smoker 11/21/2018   Asthmatic bronchitis,  moderate persistent, uncomplicated 11/20/2018   DOE (dyspnea on exertion) 04/14/2016   Streptococcus pneumoniae pneumonia 04/07/2016   Acute respiratory failure with hypoxia (HCC) 04/03/2016   Elevated lactic acid level 04/03/2016   Leukocytosis 04/03/2016   New onset type 2 diabetes mellitus (HCC) 04/03/2016   Abnormal urinalysis 04/03/2016   Trichomonas infection 04/03/2016   Non compliance w medication regimen 01/12/2016   COPD without exacerbation (HCC) 12/04/2014   AKI (acute kidney injury) 03/14/2014   Acute on chronic systolic heart failure (HCC) 03/14/2014   Musculoskeletal chest pain 03/14/2014   Dyspnea 12/03/2013   Periodic health assessment, general screening, adult 10/03/2013   Mitral valve regurgitation 08/22/2013   FIBROIDS, UTERUS 05/11/2010   ANEMIA, SECONDARY TO BLOOD LOSS 05/11/2010   Asthmatic bronchitis 05/11/2010   TOBACCO USER 07/07/2009   Essential hypertension, benign 06/19/2009   MENORRHAGIA 06/19/2009   COCAINE  ABUSE, HX OF 06/19/2009   Current Medications[1] Allergies[2]   Social History   Socioeconomic History   Marital status: Divorced    Spouse name: Not on file   Number of children: Not on file   Years of education: Not on file   Highest education level: Some college, no degree  Occupational History   Not on file  Tobacco Use   Smoking status: Some Days    Current packs/day: 0.00    Average packs/day: 1 pack/day for 35.0 years (35.0 ttl pk-yrs)    Types: Cigarettes    Start date: 08/25/1984    Last attempt to quit: 08/26/2019    Years since  quitting: 5.2   Smokeless tobacco: Never   Tobacco comments:    Smokes no more than 3 a day. 10/28/2024  Vaping Use   Vaping status: Former  Substance and Sexual Activity   Alcohol use: No    Alcohol/week: 0.0 standard drinks of alcohol   Drug use: Not Currently    Frequency: 4.0 times per week    Types: Crack cocaine     Comment: RELAPSE 12/2016   Sexual activity: Not on file  Other Topics  Concern   Not on file  Social History Narrative   Lives with son in an apartment on the third floor.  Does not work.  On disability.  Previously worked in [employer].    Education: high school.     Social Drivers of Health   Tobacco Use: High Risk (10/29/2024)   Patient History    Smoking Tobacco Use: Some Days    Smokeless Tobacco Use: Never    Passive Exposure: Not on file  Financial Resource Strain: Low Risk (09/17/2024)   Received from [facility]   Overall Financial Resource Strain (CARDIA)    How hard is it for you to pay for the very basics like food, housing, medical care, and heating?: Not very hard  Food Insecurity: Low Risk (09/17/2024)   Received from [facility]   Epic    Within the past 12 months, you worried that your food would run out before you got money to buy more: Never true    Within the past 12 months, the food you bought just didn't last and you didn't have money to get more. : Never true  Recent Concern: Food Insecurity - Food Insecurity Present (08/09/2024)   Epic    Worried About Programme Researcher, Broadcasting/film/video in the Last Year: Never true    Ran Out of Food in the Last Year: Sometimes true  Transportation Needs: No Transportation Needs (09/17/2024)   Received from [facility]    In the past 12 months, has lack of reliable transportation kept you from medical appointments, meetings, work or from getting things needed for daily living? : No  Recent Concern: Transportation Needs - Unmet Transportation Needs (08/09/2024)   Epic    Lack of Transportation (Medical): Yes    Lack of Transportation (Non-Medical): Yes  Physical Activity: Insufficiently Active (08/09/2024)   Exercise Vital Sign    Days of Exercise per Week: 4 days    Minutes of Exercise per Session: 30 min  Stress: Stress Concern Present (08/09/2024)   [employer] of Occupational Health - Occupational Stress Questionnaire    Feeling of Stress: To some extent  Social Connections:  Moderately Isolated (09/17/2024)   Received from [facility]   Social Connection and Isolation Panel    In a typical week, how many times do you talk on the phone with family, friends, or neighbors?: More than three times a week    How often do you get together with friends or relatives?: More than three times a week    How often do you attend church or religious services?: More than 4 times per year    Do you belong to any clubs or organizations such as church groups, unions, fraternal or athletic groups, or school groups?: No    How often do you attend meetings of the clubs or organizations you belong to?: Never    Are you married, widowed, divorced, separated, never married, or living with a partner?: Widowed  Intimate Partner Violence:  Not At Risk (04/05/2024)   Epic    Fear of Current or Ex-Partner: No    Emotionally Abused: No    Physically Abused: No    Sexually Abused: No  Depression (PHQ2-9): Medium Risk (10/11/2024)   Depression (PHQ2-9)    PHQ-2 Score: 10  Alcohol Screen: Not on file  Housing: Low Risk (09/17/2024)   Received from [facility]   Epic    What is your living situation today?: I have a steady place to live    Think about the place you live. Do you have problems with any of the following? Choose all that apply:: None/None on this list  Recent Concern: Housing - High Risk (08/09/2024)   Epic    Unable to Pay for Housing in the Last Year: Yes    Number of Times Moved in the Last Year: 1    Homeless in the Last Year: No  Utilities: Low Risk (09/17/2024)   Received from [facility]   Utilities    In the past 12 months has the electric, gas, oil, or water company threatened to shut off services in your home? : No  Health Literacy: Adequate Health Literacy (05/06/2024)   B1300 Health Literacy    Frequency of need for help with medical instructions: Rarely    Physical Exam      Future Appointments  Date Time Provider Department Center  11/19/2024  1:50  PM [facility]NUAL WE[facility]NESS VISIT [facility] Orlando Mulligan  11/29/2024  3:30 PM [facility] PA/NP [facility] None  01/21/2025  2:45 PM Gaynel Delon CROME, DPM [facility]  02/17/2025  3:45 PM Darlean, Ozell NOVAK, MD [facility] [address]           [1]  Current Outpatient Medications:    albuterol  (VENTOLIN  HFA) 108 (90 Base) MCG/ACT inhaler, INHALE 2 PUFFS INTO THE LUNGS EVERY 4 HOURS AS NEEDED FOR WHEEZING OR SHORTNESS OF BREATH, Disp: 17 g, Rfl: 1   allopurinol  (ZYLOPRIM ) 300 MG tablet, Take 300 mg by mouth 2 (two) times daily., Disp: , Rfl:    ammonium lactate  (LAC-HYDRIN ) 12 % lotion, Apply 1 Application topically as needed for dry skin., Disp: 400 g, Rfl: 5   atorvastatin  (LIPITOR) 40 MG tablet, Take 1 tablet (40 mg total) by mouth daily., Disp: 90 tablet, Rfl: 1   bisoprolol  (ZEBETA ) 5 MG tablet, Take 0.5 tablets (2.5 mg total) by mouth daily., Disp: 15 tablet, Rfl: 11   budesonide  (PULMICORT ) 0.25 MG/2ML nebulizer solution, One vial twice daily with albuterol  in nebulizer, Disp: 120 mL, Rfl: 12   busPIRone  (BUSPAR ) 5 MG tablet, Take 1 tablet (5 mg total) by mouth 2 (two) times daily., Disp: 120 tablet, Rfl: 1   dapagliflozin  propanediol (FARXIGA ) 10 MG TABS tablet, Take 1 tablet (10 mg total) by mouth daily., Disp: 90 tablet, Rfl: 1   famotidine  (PEPCID ) 20 MG tablet, One after supper, Disp: 90 tablet, Rfl: 1   fluticasone  (FLONASE ) 50 MCG/ACT nasal spray, Place 2 sprays into both nostrils daily., Disp: 16 g, Rfl: 6   furosemide  (LASIX ) 40 MG tablet, Take 1 tablet (40 mg total) by mouth daily., Disp: 90 tablet, Rfl: 3   gabapentin  (NEURONTIN ) 300 MG capsule, Take 1 capsule (300 mg total) by mouth 2 (two) times daily., Disp: 60 capsule, Rfl: 6   ipratropium-albuterol  (DUONEB) 0.5-2.5 (3) MG/3ML SOLN, Take 3 mLs by nebulization 2 (two) times daily., Disp: 180 mL, Rfl: 1   metFORMIN  (GLUCOPHAGE -XR) 500 MG 24 hr tablet, Take 1 tablet (500 mg  total) by mouth daily., Disp: 90 tablet, Rfl: 1    naloxone  (NARCAN ) nasal spray 4 mg/0.1 mL, Inject intranasal for overdose (Patient taking differently: Inject intranasal for overdose), Disp: 1 each, Rfl: 1   pantoprazole  (PROTONIX ) 40 MG tablet, TAKE 1 TABLET(40 MG) BY MOUTH DAILY 30 TO 60 MINUTES BEFORE FIRST MEAL OF THE DAY, Disp: 90 tablet, Rfl: 2   QUEtiapine  (SEROQUEL ) 100 MG tablet, Take 1 tablet (100 mg total) by mouth at bedtime., Disp: 90 tablet, Rfl: 1   Semaglutide , 1 MG/DOSE, 4 MG/3ML SOPN, Inject 1 mg as directed once a week. Start after 4 injections of the 0.5mg  dose., Disp: 3 mL, Rfl: 1   spironolactone  (ALDACTONE ) 25 MG tablet, Take 1 tablet (25 mg total) by mouth daily., Disp: 45 tablet, Rfl: 3   valsartan  (DIOVAN ) 40 MG tablet, Take 1 tablet (40 mg total) by mouth daily., Disp: , Rfl:    Accu-Chek Softclix Lancets lancets, Use as instructed. Check Blood sugars daily before breakfast (Patient not taking: Reported on 11/06/2024), Disp: 100 each, Rfl: 12   Blood Glucose Monitoring Suppl (ACCU-CHEK GUIDE) w/Device KIT, Check Blood sugars daily before breakfast (Patient not taking: Reported on 11/06/2024), Disp: 1 kit, Rfl: 0   glucose blood (ACCU-CHEK GUIDE TEST) test strip, Use as instructed. Check Blood sugars daily before breakfast (Patient not taking: Reported on 11/06/2024), Disp: 100 each, Rfl: 12   traZODone  (DESYREL ) 50 MG tablet, Take 1-2 tablets (50-100 mg total) by mouth at bedtime as needed for sleep. (Patient not taking: Reported on 11/06/2024), Disp: 60 tablet, Rfl: 0 [2] No Known Allergies

## 2024-11-07 ENCOUNTER — Other Ambulatory Visit (INDEPENDENT_AMBULATORY_CARE_PROVIDER_SITE_OTHER): Payer: Self-pay | Admitting: Primary Care

## 2024-11-07 DIAGNOSIS — F418 Other specified anxiety disorders: Secondary | ICD-10-CM

## 2024-11-07 NOTE — Telephone Encounter (Signed)
 Forward to provider

## 2024-11-08 ENCOUNTER — Telehealth (INDEPENDENT_AMBULATORY_CARE_PROVIDER_SITE_OTHER): Payer: Self-pay | Admitting: Primary Care

## 2024-11-08 NOTE — Telephone Encounter (Signed)
 Called pt to confirm that they have address to appt. Pt did not answer and could not LVM.

## 2024-11-11 ENCOUNTER — Telehealth: Payer: Self-pay | Admitting: Licensed Clinical Social Worker

## 2024-11-11 NOTE — Telephone Encounter (Signed)
 [facility] Navigation CSW Progress Note  Clinical Social Worker contacted patient by phone to f/u on missed callsx2 with no voicemail left. Reached pt at [phone], pt states she is fine, did not mean to call at this time. Encouraged her to reach out to Jenna, LCSW at [facility] or [facility] if needed.   Patient is participating in a Managed Medicaid Plan:  No, [facility] Medicare/Medicaid  SDOH Screenings   Food Insecurity: Low Risk (09/17/2024)   Received from [facility]  Recent Concern: Food Insecurity - Food Insecurity Present (08/09/2024)  Housing: Low Risk (09/17/2024)   Received from [facility]  Recent Concern: Housing - High Risk (08/09/2024)  Transportation Needs: No Transportation Needs (09/17/2024)   Received from [facility]  Recent Concern: Transportation Needs - Unmet Transportation Needs (08/09/2024)  Utilities: Low Risk (09/17/2024)   Received from [facility]  Depression (PHQ2-9): Medium Risk (10/11/2024)  Financial Resource Strain: Low Risk (09/17/2024)   Received from [facility]  Physical Activity: Insufficiently Active (08/09/2024)  Social Connections: Moderately Isolated (09/17/2024)   Received from [facility]  Stress: Stress Concern Present (08/09/2024)  Tobacco Use: High Risk (10/29/2024)  Health Literacy: Adequate Health Literacy (05/06/2024)   Marit Lark, MSW, LCSW Clinical Social Worker II [facility] Navigation  [phone]- work cell phone (preferred)

## 2024-11-13 ENCOUNTER — Other Ambulatory Visit (HOSPITAL_COMMUNITY): Payer: Self-pay | Admitting: Emergency Medicine

## 2024-11-13 NOTE — Progress Notes (Unsigned)
 Paramedicine Encounter    Patient ID: Stacey Lin, female    DOB: 06-23-1966, 59 y.o.   MRN: [id number]   Complaints NONE  Assessment A&O x 4, skin W&D w/ good color  Denies chest pain or SOB.  Lung sounds clear and equal bilat.  No peripheral edema noted.  Meds reconciled x 2 weeks.  Compliance with meds YES  Pill box filled x 2 weeks  Refills needed Quetiapine , allopurinol - caled in  Meds changes since last visit NO    Social changes NONE   BP 108/78 (BP Location: Left Arm, Patient Position: Sitting, Cuff Size: Normal)   Pulse 76   Wt 196 lb 6.4 oz (89.1 kg)   LMP  (LMP Unknown)   BMI 38.36 kg/m  Weight yesterday- Last visit weight-195.6lb  ACTION: Home visit completed  [name] [phone] 11/13/24  Patient Care Team: Celestia [name] SQUIBB, NP as PCP - General (Internal Medicine)  Patient Active Problem List   Diagnosis Date Noted   Low blood pressure, not hypotension (required pausing some of her GDMT) 04/18/2024   Pruritic intertrigo 04/18/2024   Hot flashes due to menopause 04/18/2024   Constipation 04/18/2024   Acute respiratory distress 04/05/2024   Lumbar radiculopathy 03/13/2024   CHF (congestive heart failure) (HCC) 02/29/2024   COPD with acute exacerbation (HCC) 02/28/2024   Respiratory failure with hypoxia (HCC) 11/21/2023   COPD exacerbation (HCC) 07/25/2023   Nonrheumatic aortic valve insufficiency    Elevated LFTs 04/08/2022   Acute metabolic encephalopathy 04/08/2022   Depressed mood 04/08/2022   Headache 04/07/2022   Obesity (BMI 30-39.9) 04/07/2022   Acute exacerbation of CHF (congestive heart failure) (HCC) 04/06/2022   Polysubstance abuse (HCC) 04/06/2022   Elevated troponin 04/06/2022   Controlled type 2 diabetes mellitus with hyperglycemia (HCC) 04/06/2022   Transaminitis 04/06/2022   Hypoalbuminemia 04/06/2022   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/11/2021   Upper airway cough syndrome 12/20/2018    Cigarette smoker 11/21/2018   Asthmatic bronchitis, moderate persistent, uncomplicated 11/20/2018   DOE (dyspnea on exertion) 04/14/2016   Streptococcus pneumoniae pneumonia 04/07/2016   Acute respiratory failure with hypoxia (HCC) 04/03/2016   Elevated lactic acid level 04/03/2016   Leukocytosis 04/03/2016   New onset type 2 diabetes mellitus (HCC) 04/03/2016   Abnormal urinalysis 04/03/2016   Trichomonas infection 04/03/2016   Non compliance w medication regimen 01/12/2016   COPD without exacerbation (HCC) 12/04/2014   AKI (acute kidney injury) 03/14/2014   Acute on chronic systolic heart failure (HCC) 03/14/2014   Musculoskeletal chest pain 03/14/2014   Dyspnea 12/03/2013   Periodic health assessment, general screening, adult 10/03/2013   Mitral valve regurgitation 08/22/2013   FIBROIDS, UTERUS 05/11/2010   ANEMIA, SECONDARY TO BLOOD LOSS 05/11/2010   Asthmatic bronchitis 05/11/2010   TOBACCO USER 07/07/2009   Essential hypertension, benign 06/19/2009   MENORRHAGIA 06/19/2009   COCAINE  ABUSE, HX OF 06/19/2009   Current Medications[1] Allergies[2]   Social History   Socioeconomic History   Marital status: Divorced    Spouse name: Not on file   Number of children: Not on file   Years of education: Not on file   Highest education level: Some college, no degree  Occupational History   Not on file  Tobacco Use   Smoking status: Some Days    Current packs/day: 0.00    Average packs/day: 1 pack/day for 35.0 years (35.0 ttl pk-yrs)    Types: Cigarettes    Start date: 08/25/1984    Last attempt to quit: 08/26/2019  Years since quitting: 5.2   Smokeless tobacco: Never   Tobacco comments:    Smokes no more than 3 a day. 10/28/2024  Vaping Use   Vaping status: Former  Substance and Sexual Activity   Alcohol use: No    Alcohol/week: 0.0 standard drinks of alcohol   Drug use: Not Currently    Frequency: 4.0 times per week    Types: Crack cocaine     Comment: RELAPSE  12/2016   Sexual activity: Not on file  Other Topics Concern   Not on file  Social History Narrative   Lives with son in an apartment on the third floor.  Does not work.  On disability.  Previously worked in [employer].    Education: high school.     Social Drivers of Health   Tobacco Use: High Risk (10/29/2024)   Patient History    Smoking Tobacco Use: Some Days    Smokeless Tobacco Use: Never    Passive Exposure: Not on file  Financial Resource Strain: Low Risk (09/17/2024)   Received from [facility]   Overall Financial Resource Strain (CARDIA)    How hard is it for you to pay for the very basics like food, housing, medical care, and heating?: Not very hard  Food Insecurity: Low Risk (09/17/2024)   Received from [facility]   Epic    Within the past 12 months, you worried that your food would run out before you got money to buy more: Never true    Within the past 12 months, the food you bought just didn't last and you didn't have money to get more. : Never true  Recent Concern: Food Insecurity - Food Insecurity Present (08/09/2024)   Epic    Worried About Programme Researcher, Broadcasting/film/video in the Last Year: Never true    Ran Out of Food in the Last Year: Sometimes true  Transportation Needs: No Transportation Needs (09/17/2024)   Received from [facility]    In the past 12 months, has lack of reliable transportation kept you from medical appointments, meetings, work or from getting things needed for daily living? : No  Recent Concern: Transportation Needs - Unmet Transportation Needs (08/09/2024)   Epic    Lack of Transportation (Medical): Yes    Lack of Transportation (Non-Medical): Yes  Physical Activity: Insufficiently Active (08/09/2024)   Exercise Vital Sign    Days of Exercise per Week: 4 days    Minutes of Exercise per Session: 30 min  Stress: Stress Concern Present (08/09/2024)   [employer] of Occupational Health - Occupational Stress Questionnaire     Feeling of Stress: To some extent  Social Connections: Moderately Isolated (09/17/2024)   Received from [facility]   Social Connection and Isolation Panel    In a typical week, how many times do you talk on the phone with family, friends, or neighbors?: More than three times a week    How often do you get together with friends or relatives?: More than three times a week    How often do you attend church or religious services?: More than 4 times per year    Do you belong to any clubs or organizations such as church groups, unions, fraternal or athletic groups, or school groups?: No    How often do you attend meetings of the clubs or organizations you belong to?: Never    Are you married, widowed, divorced, separated, never married, or living with a partner?: Widowed  Intimate  Partner Violence: Not At Risk (04/05/2024)   Epic    Fear of Current or Ex-Partner: No    Emotionally Abused: No    Physically Abused: No    Sexually Abused: No  Depression (PHQ2-9): Medium Risk (10/11/2024)   Depression (PHQ2-9)    PHQ-2 Score: 10  Alcohol Screen: Not on file  Housing: Low Risk (09/17/2024)   Received from [facility]   Epic    What is your living situation today?: I have a steady place to live    Think about the place you live. Do you have problems with any of the following? Choose all that apply:: None/None on this list  Recent Concern: Housing - High Risk (08/09/2024)   Epic    Unable to Pay for Housing in the Last Year: Yes    Number of Times Moved in the Last Year: 1    Homeless in the Last Year: No  Utilities: Low Risk (09/17/2024)   Received from [facility]   Utilities    In the past 12 months has the electric, gas, oil, or water company threatened to shut off services in your home? : No  Health Literacy: Adequate Health Literacy (05/06/2024)   B1300 Health Literacy    Frequency of need for help with medical instructions: Rarely    Physical Exam      Future Appointments   Date Time Provider Department Center  11/19/2024  1:50 PM [facility]NUAL [facility] VISIT [facility] Orlando Mulligan  11/29/2024  3:30 PM [facility] PA/NP [facility] None  01/21/2025  2:45 PM Gaynel Delon CROME, DPM [facility]  02/17/2025  3:45 PM Darlean, Ozell NOVAK, MD [facility] [address]          [1]  Current Outpatient Medications:    albuterol  (VENTOLIN  HFA) 108 (90 Base) MCG/ACT inhaler, INHALE 2 PUFFS INTO THE LUNGS EVERY 4 HOURS AS NEEDED FOR WHEEZING OR SHORTNESS OF BREATH, Disp: 17 g, Rfl: 1   allopurinol  (ZYLOPRIM ) 300 MG tablet, Take 300 mg by mouth 2 (two) times daily., Disp: , Rfl:    atorvastatin  (LIPITOR) 40 MG tablet, Take 1 tablet (40 mg total) by mouth daily., Disp: 90 tablet, Rfl: 1   bisoprolol  (ZEBETA ) 5 MG tablet, Take 0.5 tablets (2.5 mg total) by mouth daily., Disp: 15 tablet, Rfl: 11   budesonide  (PULMICORT ) 0.25 MG/2ML nebulizer solution, One vial twice daily with albuterol  in nebulizer, Disp: 120 mL, Rfl: 12   busPIRone  (BUSPAR ) 5 MG tablet, Take 1 tablet (5 mg total) by mouth 2 (two) times daily., Disp: 120 tablet, Rfl: 1   dapagliflozin  propanediol (FARXIGA ) 10 MG TABS tablet, Take 1 tablet (10 mg total) by mouth daily., Disp: 90 tablet, Rfl: 1   famotidine  (PEPCID ) 20 MG tablet, One after supper, Disp: 90 tablet, Rfl: 1   fluticasone  (FLONASE ) 50 MCG/ACT nasal spray, Place 2 sprays into both nostrils daily., Disp: 16 g, Rfl: 6   furosemide  (LASIX ) 40 MG tablet, Take 1 tablet (40 mg total) by mouth daily., Disp: 90 tablet, Rfl: 3   gabapentin  (NEURONTIN ) 300 MG capsule, Take 1 capsule (300 mg total) by mouth 2 (two) times daily., Disp: 60 capsule, Rfl: 6   ipratropium-albuterol  (DUONEB) 0.5-2.5 (3) MG/3ML SOLN, Take 3 mLs by nebulization 2 (two) times daily., Disp: 180 mL, Rfl: 1   metFORMIN  (GLUCOPHAGE -XR) 500 MG 24 hr tablet, Take 1 tablet (500 mg total) by mouth daily., Disp: 90 tablet, Rfl: 1   naloxone  (NARCAN ) nasal spray 4 mg/0.1 mL, Inject intranasal for  overdose (Patient taking differently: Inject intranasal for overdose), Disp: 1 each, Rfl: 1   QUEtiapine  (SEROQUEL ) 100 MG tablet, Take 1 tablet (100 mg total) by mouth at bedtime., Disp: 90 tablet, Rfl: 1   spironolactone  (ALDACTONE ) 25 MG tablet, Take 1 tablet (25 mg total) by mouth daily., Disp: 45 tablet, Rfl: 3   valsartan  (DIOVAN ) 40 MG tablet, Take 1 tablet (40 mg total) by mouth daily., Disp: , Rfl:    Accu-Chek Softclix Lancets lancets, Use as instructed. Check Blood sugars daily before breakfast (Patient not taking: Reported on 11/13/2024), Disp: 100 each, Rfl: 12   ammonium lactate  (LAC-HYDRIN ) 12 % lotion, Apply 1 Application topically as needed for dry skin., Disp: 400 g, Rfl: 5   Blood Glucose Monitoring Suppl (ACCU-CHEK GUIDE) w/Device KIT, Check Blood sugars daily before breakfast (Patient not taking: Reported on 11/13/2024), Disp: 1 kit, Rfl: 0   glucose blood (ACCU-CHEK GUIDE TEST) test strip, Use as instructed. Check Blood sugars daily before breakfast (Patient not taking: Reported on 11/13/2024), Disp: 100 each, Rfl: 12   pantoprazole  (PROTONIX ) 40 MG tablet, TAKE 1 TABLET(40 MG) BY MOUTH DAILY 30 TO 60 MINUTES BEFORE FIRST MEAL OF THE DAY, Disp: 90 tablet, Rfl: 2   Semaglutide , 1 MG/DOSE, 4 MG/3ML SOPN, Inject 1 mg as directed once a week. Start after 4 injections of the 0.5mg  dose., Disp: 3 mL, Rfl: 1   traZODone  (DESYREL ) 50 MG tablet, Take 1-2 tablets (50-100 mg total) by mouth at bedtime as needed for sleep. (Patient not taking: Reported on 11/13/2024), Disp: 60 tablet, Rfl: 0 [2] No Known Allergies

## 2024-11-14 ENCOUNTER — Other Ambulatory Visit (HOSPITAL_COMMUNITY): Payer: Self-pay | Admitting: Family Medicine

## 2024-11-14 DIAGNOSIS — I5022 Chronic systolic (congestive) heart failure: Secondary | ICD-10-CM

## 2024-11-15 ENCOUNTER — Other Ambulatory Visit (INDEPENDENT_AMBULATORY_CARE_PROVIDER_SITE_OTHER): Payer: Self-pay

## 2024-11-15 DIAGNOSIS — F418 Other specified anxiety disorders: Secondary | ICD-10-CM

## 2024-11-15 MED ORDER — BUSPIRONE HCL 5 MG PO TABS: 5.0000 mg | ORAL_TABLET | Freq: Two times a day (BID) | ORAL | 1 refills | Status: AC

## 2024-11-19 ENCOUNTER — Ambulatory Visit (INDEPENDENT_AMBULATORY_CARE_PROVIDER_SITE_OTHER): Payer: Self-pay

## 2024-11-20 ENCOUNTER — Other Ambulatory Visit (HOSPITAL_COMMUNITY): Payer: Self-pay | Admitting: Emergency Medicine

## 2024-11-20 ENCOUNTER — Telehealth (HOSPITAL_COMMUNITY): Payer: Self-pay

## 2024-11-20 NOTE — Progress Notes (Unsigned)
 Paramedicine Encounter    Patient ID: Stacey Lin, female    DOB: December 05, 1965, 59 y.o.   MRN: [id number]   Complaints Some exertional SOB  Assessment A&O x 4, skin W&D w/ good color.  Lung sounds clear in bases w/ expiratory wheezes in upper lobes.  Weight up 4lbs.  Face looks puffy. Called HF Triage for possible orders  Compliance with meds YES  Pill box filled x 12 days  Refills needed allopurinol , quetiapine , - Pharmacy will provide 80 extra Allopurinol  as pt's pill bottle was shorted on quantity.  Meds changes since last visit  NONE   Social changes NONE   Weight yesterday- Last visit weight-196lb  ACTION: Home visit completed  [name] [phone] 11/20/24  Patient Care Team: Celestia [name] SQUIBB, NP as PCP - General (Internal Medicine)  Patient Active Problem List   Diagnosis Date Noted   Low blood pressure, not hypotension (required pausing some of her GDMT) 04/18/2024   Pruritic intertrigo 04/18/2024   Hot flashes due to menopause 04/18/2024   Constipation 04/18/2024   Acute respiratory distress 04/05/2024   Lumbar radiculopathy 03/13/2024   CHF (congestive heart failure) (HCC) 02/29/2024   COPD with acute exacerbation (HCC) 02/28/2024   Respiratory failure with hypoxia (HCC) 11/21/2023   COPD exacerbation (HCC) 07/25/2023   Nonrheumatic aortic valve insufficiency    Elevated LFTs 04/08/2022   Acute metabolic encephalopathy 04/08/2022   Depressed mood 04/08/2022   Headache 04/07/2022   Obesity (BMI 30-39.9) 04/07/2022   Acute exacerbation of CHF (congestive heart failure) (HCC) 04/06/2022   Polysubstance abuse (HCC) 04/06/2022   Elevated troponin 04/06/2022   Controlled type 2 diabetes mellitus with hyperglycemia (HCC) 04/06/2022   Transaminitis 04/06/2022   Hypoalbuminemia 04/06/2022   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/11/2021   Upper airway cough syndrome 12/20/2018   Cigarette smoker 11/21/2018   Asthmatic bronchitis,  moderate persistent, uncomplicated 11/20/2018   DOE (dyspnea on exertion) 04/14/2016   Streptococcus pneumoniae pneumonia 04/07/2016   Acute respiratory failure with hypoxia (HCC) 04/03/2016   Elevated lactic acid level 04/03/2016   Leukocytosis 04/03/2016   New onset type 2 diabetes mellitus (HCC) 04/03/2016   Abnormal urinalysis 04/03/2016   Trichomonas infection 04/03/2016   Non compliance w medication regimen 01/12/2016   COPD without exacerbation (HCC) 12/04/2014   AKI (acute kidney injury) 03/14/2014   Acute on chronic systolic heart failure (HCC) 03/14/2014   Musculoskeletal chest pain 03/14/2014   Dyspnea 12/03/2013   Periodic health assessment, general screening, adult 10/03/2013   Mitral valve regurgitation 08/22/2013   FIBROIDS, UTERUS 05/11/2010   ANEMIA, SECONDARY TO BLOOD LOSS 05/11/2010   Asthmatic bronchitis 05/11/2010   TOBACCO USER 07/07/2009   Essential hypertension, benign 06/19/2009   MENORRHAGIA 06/19/2009   COCAINE  ABUSE, HX OF 06/19/2009   Current Medications[1] Allergies[2]   Social History   Socioeconomic History   Marital status: Divorced    Spouse name: Not on file   Number of children: Not on file   Years of education: Not on file   Highest education level: Some college, no degree  Occupational History   Not on file  Tobacco Use   Smoking status: Some Days    Current packs/day: 0.00    Average packs/day: 1 pack/day for 35.0 years (35.0 ttl pk-yrs)    Types: Cigarettes    Start date: 08/25/1984    Last attempt to quit: 08/26/2019    Years since quitting: 5.2   Smokeless tobacco: Never   Tobacco comments:    Smokes  no more than 3 a day. 10/28/2024  Vaping Use   Vaping status: Former  Substance and Sexual Activity   Alcohol use: No    Alcohol/week: 0.0 standard drinks of alcohol   Drug use: Not Currently    Frequency: 4.0 times per week    Types: Crack cocaine     Comment: RELAPSE 12/2016   Sexual activity: Not on file  Other Topics  Concern   Not on file  Social History Narrative   Lives with son in an apartment on the third floor.  Does not work.  On disability.  Previously worked in [employer].    Education: high school.     Social Drivers of Health   Tobacco Use: High Risk (10/29/2024)   Patient History    Smoking Tobacco Use: Some Days    Smokeless Tobacco Use: Never    Passive Exposure: Not on file  Financial Resource Strain: Low Risk (09/17/2024)   Received from [facility]   Overall Financial Resource Strain (CARDIA)    How hard is it for you to pay for the very basics like food, housing, medical care, and heating?: Not very hard  Food Insecurity: Low Risk (09/17/2024)   Received from [facility]   Epic    Within the past 12 months, you worried that your food would run out before you got money to buy more: Never true    Within the past 12 months, the food you bought just didn't last and you didn't have money to get more. : Never true  Recent Concern: Food Insecurity - Food Insecurity Present (08/09/2024)   Epic    Worried About Programme Researcher, Broadcasting/film/video in the Last Year: Never true    Ran Out of Food in the Last Year: Sometimes true  Transportation Needs: No Transportation Needs (09/17/2024)   Received from [facility]    In the past 12 months, has lack of reliable transportation kept you from medical appointments, meetings, work or from getting things needed for daily living? : No  Recent Concern: Transportation Needs - Unmet Transportation Needs (08/09/2024)   Epic    Lack of Transportation (Medical): Yes    Lack of Transportation (Non-Medical): Yes  Physical Activity: Insufficiently Active (08/09/2024)   Exercise Vital Sign    Days of Exercise per Week: 4 days    Minutes of Exercise per Session: 30 min  Stress: Stress Concern Present (08/09/2024)   [employer]ational [employer] - Occupational Stress Questionnaire    Feeling of Stress: To some extent  Social Connections:  Moderately Isolated (09/17/2024)   Received from [facility]   Social Connection and Isolation Panel    In a typical week, how many times do you talk on the phone with family, friends, or neighbors?: More than three times a week    How often do you get together with friends or relatives?: More than three times a week    How often do you attend church or religious services?: More than 4 times per year    Do you belong to any clubs or organizations such as church groups, unions, fraternal or athletic groups, or school groups?: No    How often do you attend meetings of the clubs or organizations you belong to?: Never    Are you married, widowed, divorced, separated, never married, or living with a partner?: Widowed  Intimate Partner Violence: Not At Risk (04/05/2024)   Epic    Fear of Current or Ex-Partner:  No    Emotionally Abused: No    Physically Abused: No    Sexually Abused: No  Depression (PHQ2-9): Medium Risk (10/11/2024)   Depression (PHQ2-9)    PHQ-2 Score: 10  Alcohol Screen: Not on file  Housing: Low Risk (09/17/2024)   Received from [facility]   Epic    What is your living situation today?: I have a steady place to live    Think about the place you live. Do you have problems with any of the following? Choose all that apply:: None/None on this list  Recent Concern: Housing - High Risk (08/09/2024)   Epic    Unable to Pay for Housing in the Last Year: Yes    Number of Times Moved in the Last Year: 1    Homeless in the Last Year: No  Utilities: Low Risk (09/17/2024)   Received from [facility]   Utilities    In the past 12 months has the electric, gas, oil, or water company threatened to shut off services in your home? : No  Health Literacy: Adequate Health Literacy (05/06/2024)   B1300 Health Literacy    Frequency of need for help with medical instructions: Rarely    Physical Exam      Future Appointments  Date Time Provider Department Center  11/29/2024  3:30  PM [facility] PA/NP [facility] None  01/21/2025  2:45 PM Gaynel Delon CROME, DPM [facility]  02/17/2025  3:45 PM Darlean, Ozell NOVAK, MD [facility] [address]           [1]  Current Outpatient Medications:    Accu-Chek Softclix Lancets lancets, Use as instructed. Check Blood sugars daily before breakfast (Patient not taking: Reported on 11/13/2024), Disp: 100 each, Rfl: 12   albuterol  (VENTOLIN  HFA) 108 (90 Base) MCG/ACT inhaler, INHALE 2 PUFFS INTO THE LUNGS EVERY 4 HOURS AS NEEDED FOR WHEEZING OR SHORTNESS OF BREATH, Disp: 17 g, Rfl: 1   allopurinol  (ZYLOPRIM ) 300 MG tablet, Take 300 mg by mouth 2 (two) times daily., Disp: , Rfl:    ammonium lactate  (LAC-HYDRIN ) 12 % lotion, Apply 1 Application topically as needed for dry skin., Disp: 400 g, Rfl: 5   atorvastatin  (LIPITOR) 40 MG tablet, Take 1 tablet (40 mg total) by mouth daily., Disp: 90 tablet, Rfl: 1   bisoprolol  (ZEBETA ) 5 MG tablet, Take 0.5 tablets (2.5 mg total) by mouth daily., Disp: 15 tablet, Rfl: 11   Blood Glucose Monitoring Suppl (ACCU-CHEK GUIDE) w/Device KIT, Check Blood sugars daily before breakfast (Patient not taking: Reported on 11/13/2024), Disp: 1 kit, Rfl: 0   budesonide  (PULMICORT ) 0.25 MG/2ML nebulizer solution, One vial twice daily with albuterol  in nebulizer, Disp: 120 mL, Rfl: 12   busPIRone  (BUSPAR ) 5 MG tablet, Take 1 tablet (5 mg total) by mouth 2 (two) times daily., Disp: 120 tablet, Rfl: 1   dapagliflozin  propanediol (FARXIGA ) 10 MG TABS tablet, Take 1 tablet (10 mg total) by mouth daily., Disp: 90 tablet, Rfl: 1   famotidine  (PEPCID ) 20 MG tablet, One after supper, Disp: 90 tablet, Rfl: 1   fluticasone  (FLONASE ) 50 MCG/ACT nasal spray, Place 2 sprays into both nostrils daily., Disp: 16 g, Rfl: 6   furosemide  (LASIX ) 40 MG tablet, Take 1 tablet (40 mg total) by mouth daily., Disp: 90 tablet, Rfl: 3   gabapentin  (NEURONTIN ) 300 MG capsule, Take 1 capsule (300 mg total) by mouth 2 (two) times daily., Disp:  60 capsule, Rfl: 6   glucose blood (ACCU-CHEK GUIDE TEST) test  strip, Use as instructed. Check Blood sugars daily before breakfast (Patient not taking: Reported on 11/13/2024), Disp: 100 each, Rfl: 12   ipratropium-albuterol  (DUONEB) 0.5-2.5 (3) MG/3ML SOLN, Take 3 mLs by nebulization 2 (two) times daily., Disp: 180 mL, Rfl: 1   metFORMIN  (GLUCOPHAGE -XR) 500 MG 24 hr tablet, Take 1 tablet (500 mg total) by mouth daily., Disp: 90 tablet, Rfl: 1   naloxone  (NARCAN ) nasal spray 4 mg/0.1 mL, Inject intranasal for overdose (Patient taking differently: Inject intranasal for overdose), Disp: 1 each, Rfl: 1   pantoprazole  (PROTONIX ) 40 MG tablet, TAKE 1 TABLET(40 MG) BY MOUTH DAILY 30 TO 60 MINUTES BEFORE FIRST MEAL OF THE DAY, Disp: 90 tablet, Rfl: 2   QUEtiapine  (SEROQUEL ) 100 MG tablet, Take 1 tablet (100 mg total) by mouth at bedtime., Disp: 90 tablet, Rfl: 1   Semaglutide , 1 MG/DOSE, 4 MG/3ML SOPN, Inject 1 mg as directed once a week. Start after 4 injections of the 0.5mg  dose., Disp: 3 mL, Rfl: 1   spironolactone  (ALDACTONE ) 25 MG tablet, TAKE 1 TABLET(25 MG) BY MOUTH DAILY, Disp: 90 tablet, Rfl: 3   traZODone  (DESYREL ) 50 MG tablet, Take 1-2 tablets (50-100 mg total) by mouth at bedtime as needed for sleep. (Patient not taking: Reported on 11/13/2024), Disp: 60 tablet, Rfl: 0   valsartan  (DIOVAN ) 40 MG tablet, Take 1 tablet (40 mg total) by mouth daily., Disp: , Rfl:  [2] No Known Allergies

## 2024-11-20 NOTE — Telephone Encounter (Signed)
 DeDe called to report that the patients weight is up 4 pounds, she also has a puffy face. Patient currently takes 40mg  every day of torsemide  and has not missed any doses. Please advise.

## 2024-11-21 NOTE — Telephone Encounter (Signed)
 My apologies she takes lasix 

## 2024-11-27 NOTE — Progress Notes (Signed)
 "    [facility] PCP: Celestia [name] SQUIBB, NP Pulmonologist: Dr. Darlean HF Cardiologist: Dr. Cherrie   HPI: Stacey Lin is a 59 y.o. 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  EF as low as 30-35% in 2014.   cMRI 11/14: EF 32% RV mildly dilated, no scar or infiltrative disease. Mod MR and Mod-sev TR  [facility] 8/15: ectatic coronaries (suspect d/t HTN) no significant arthrosclerosis and normal EF. No significant MR noted  Echo 8/15: EF 55-60%, mild/mod AI, grade I DD and mild MR  EF stable at 55-60% in 2019.   She was admitted in 6/23 with acute respiratory failure with hypoxia 2/2 acute on chronic systolic CHF. EF down to 30-35% on echo with normal RV, severe BAE, moderate to severe MR, moderate to severe TR and moderate to severe AI. Diuresed and GDMT titrated. R/LHC with no CAD and reduced Fick CI of 1.96.  Readmit 10/24 with COPD exacerbation in setting of noncompliance with medications and ongoing tobacco use + recent cocaine  use. Repeat echo with EF 40-45%.  Admitted 1/25 with acute respiratory failure 2/2 COPD exacerbation and influenza A. She also had acute on chronic CHF and diuresed with IV lasix .  Admitted in 6/25 with PNA. Echo down 30-35%. Mild MR/AS.   Admitted to [facility] 11/25 with CAP/COPD exacerbation. Briefly required bipap. Treatment included abx, steroids and breathing treatments. Blood cultures + for Enterobacterales and E. Coli. Pulmonary edema noted, given 7 days of lasix  then back to PRN. Echo showed EF 40-45%, RV ok, ? Evidence for rheumatic mitral stenosis, visually mild MS, moderate AI.  Today she returns for HF follow up with Stacey Lin, paramedic. Overall feeling fine. She has SOB walking short distances on flat ground or with ADLs. She feels light-headed occasionally. She has CP when she has fluid on board.  Denies palpitations, abnormal bleeding, edema, or PND/Orthopnea. Appetite ok. Weight at home 200 pounds. Taking all medications.  Denies ETOH, drug use, smokes 3 cigs/week. Lives with her son.  FH: Mother deceased: Dementia, seizures, stomach cancer        Father deceased: HTN  Review of systems complete and found to be negative unless listed in HPI.    Past Medical History:  Diagnosis Date   Anemia    Arrhythmia    Asthma    CHF (congestive heart failure) (HCC)    1990s   Chronic headaches    COPD (chronic obstructive pulmonary disease) (HCC)    ??   Hypercholesteremia    Hypertension    Current Outpatient Medications  Medication Sig Dispense Refill   albuterol  (VENTOLIN  HFA) 108 (90 Base) MCG/ACT inhaler INHALE 2 PUFFS INTO THE LUNGS EVERY 4 HOURS AS NEEDED FOR WHEEZING OR SHORTNESS OF BREATH 17 g 1   allopurinol  (ZYLOPRIM ) 300 MG tablet Take 300 mg by mouth 2 (two) times daily.     ammonium lactate  (LAC-HYDRIN ) 12 % lotion Apply 1 Application topically as needed for dry skin. 400 g 5   atorvastatin  (LIPITOR) 40 MG tablet Take 1 tablet (40 mg total) by mouth daily. 90 tablet 1   bisoprolol  (ZEBETA ) 5 MG tablet Take 0.5 tablets (2.5 mg total) by mouth daily. 15 tablet 11   budesonide  (PULMICORT ) 0.25 MG/2ML nebulizer solution One vial twice daily with albuterol  in nebulizer 120 mL 12   busPIRone  (BUSPAR ) 5 MG tablet Take 1 tablet (5 mg total) by  mouth 2 (two) times daily. 120 tablet 1   dapagliflozin  propanediol (FARXIGA ) 10 MG TABS tablet Take 1 tablet (10 mg total) by mouth daily. 90 tablet 1   famotidine  (PEPCID ) 20 MG tablet One after supper 90 tablet 1   fluticasone  (FLONASE ) 50 MCG/ACT nasal spray Place 2 sprays into both nostrils daily. 16 g 6   furosemide  (LASIX ) 40 MG tablet Take 1 tablet (40 mg total) by mouth daily. 90 tablet 3   gabapentin  (NEURONTIN ) 300 MG capsule Take 1 capsule (300 mg total) by mouth 2 (two) times daily. 60 capsule 6   ipratropium-albuterol  (DUONEB)  0.5-2.5 (3) MG/3ML SOLN Take 3 mLs by nebulization 2 (two) times daily. 180 mL 1   metFORMIN  (GLUCOPHAGE -XR) 500 MG 24 hr tablet Take 1 tablet (500 mg total) by mouth daily. 90 tablet 1   naloxone  (NARCAN ) nasal spray 4 mg/0.1 mL Inject intranasal for overdose 1 each 1   pantoprazole  (PROTONIX ) 40 MG tablet TAKE 1 TABLET(40 MG) BY MOUTH DAILY 30 TO 60 MINUTES BEFORE FIRST MEAL OF THE DAY 90 tablet 2   QUEtiapine  (SEROQUEL ) 100 MG tablet Take 1 tablet (100 mg total) by mouth at bedtime. 90 tablet 1   spironolactone  (ALDACTONE ) 25 MG tablet TAKE 1 TABLET(25 MG) BY MOUTH DAILY 90 tablet 3   valsartan  (DIOVAN ) 40 MG tablet Take 1 tablet (40 mg total) by mouth daily.     Accu-Chek Softclix Lancets lancets Use as instructed. Check Blood sugars daily before breakfast (Patient not taking: Reported on 11/29/2024) 100 each 12   Blood Glucose Monitoring Suppl (ACCU-CHEK GUIDE) w/Device KIT Check Blood sugars daily before breakfast (Patient not taking: Reported on 11/29/2024) 1 kit 0   glucose blood (ACCU-CHEK GUIDE TEST) test strip Use as instructed. Check Blood sugars daily before breakfast (Patient not taking: Reported on 11/29/2024) 100 each 12   Semaglutide , 1 MG/DOSE, 4 MG/3ML SOPN Inject 1 mg as directed once a week. Start after 4 injections of the 0.5mg  dose. (Patient not taking: Reported on 11/29/2024) 3 mL 1   No current facility-administered medications for this encounter.   Wt Readings from Last 3 Encounters:  11/29/24 89.7 kg (197 lb 12.8 oz)  11/20/24 90.7 kg (200 lb)  11/13/24 89.1 kg (196 lb 6.4 oz)    BP (!) 90/50   Pulse 88   Wt 89.7 kg (197 lb 12.8 oz)   LMP  (LMP Unknown)   SpO2 96%   BMI 38.63 kg/m   PHYSICAL EXAM: General:  NAD. No resp difficulty, walked into clinic, fatigued-appearing HEENT: Normal Neck: Supple. No JVD. Cor: Regular rate & rhythm. No rubs, gallops, murmurs Lungs: Clear Abdomen: Soft, nontender, nondistended.  Extremities: No cyanosis, clubbing, rash,  edema Neuro: Alert & oriented x 3, moves all 4 extremities w/o difficulty. Flat affect  ASSESSMENT & PLAN:  Chronic HFrEF > HFmrEF, NICM - EF 30-35% in 2014 - Cath 8/15 showed normal cors  - Echo 6/19: EF 55-65%  - Echo 6/23: EF 30-35%, nl RV, severe BAE, mod/severe MR, mod/severe TR and mod/severe AI - Echo 10/24: EF 40-45%, RV okay, PASP 65 mmHg, mod/severe MR, mild/moderate TR, moderate AI - Echo 5/25: EF 30-35%.  - Echo 11/25  EF 45-50%, G1DD, nl RV - NYHA III. Volume OK.  - With low BP, decrease valsartan  to 20 mg at bedtime  - Continue Lasix  40 mg daily.  - Continue bisoprol 2.5 mg daily - Continue Farxiga  10 mg daily. No GU symptoms. - Continue spiro 25 mg  daily. - Labs today  2.  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 There is evidence of only mild MS by visual assessment on 11/25 - Moderate AI - Follow  3.  HTN - BP low - Decrease valsartan  as above - Labs today.  4. Substance abuse - UDS 5/25 + cocaine   - Denies recent cocaine  use.   5. Tobacco Abuse - Smoking occasionally. - Discussed cessation  6. SDOH - Living with her son.  - Now followed by paramedicine appreciate their assistance. Compliance has improved significantly.  7. Depression - Per PCP   8. Fatigue - likely multifactorial - Check CBC & iron panel  Follow up in 3-4 months with Dr. Bensimhon   Truc Winfree M Marcey Persad, FNP 3:54 PM "

## 2024-11-28 ENCOUNTER — Other Ambulatory Visit: Payer: Self-pay

## 2024-11-28 ENCOUNTER — Other Ambulatory Visit: Payer: Self-pay | Admitting: Family Medicine

## 2024-11-28 ENCOUNTER — Telehealth (HOSPITAL_COMMUNITY): Payer: Self-pay

## 2024-11-28 NOTE — Telephone Encounter (Signed)
 Called to confirm/remind patient of their appointment at the [facility] on 11/29/24.   Appointment:   [x] Confirmed  [] Left mess   [] No answer/No voice mail  [] VM Full/unable to leave message  [] Phone not in service  Patient reminded to bring all medications and/or complete list.  Confirmed patient has transportation. Gave directions, instructed to utilize valet parking.

## 2024-11-29 ENCOUNTER — Ambulatory Visit: Payer: Self-pay

## 2024-11-29 ENCOUNTER — Encounter (HOSPITAL_COMMUNITY): Payer: Self-pay

## 2024-11-29 ENCOUNTER — Ambulatory Visit (HOSPITAL_COMMUNITY): Admission: RE | Admit: 2024-11-29 | Source: Ambulatory Visit

## 2024-11-29 ENCOUNTER — Other Ambulatory Visit (HOSPITAL_COMMUNITY): Payer: Self-pay | Admitting: Emergency Medicine

## 2024-11-29 ENCOUNTER — Other Ambulatory Visit: Payer: Self-pay

## 2024-11-29 VITALS — BP 90/50 | HR 88 | Wt 197.8 lb

## 2024-11-29 DIAGNOSIS — F1910 Other psychoactive substance abuse, uncomplicated: Secondary | ICD-10-CM

## 2024-11-29 DIAGNOSIS — Z139 Encounter for screening, unspecified: Secondary | ICD-10-CM

## 2024-11-29 DIAGNOSIS — I5020 Unspecified systolic (congestive) heart failure: Secondary | ICD-10-CM

## 2024-11-29 DIAGNOSIS — R5383 Other fatigue: Secondary | ICD-10-CM

## 2024-11-29 DIAGNOSIS — Z720 Tobacco use: Secondary | ICD-10-CM

## 2024-11-29 DIAGNOSIS — I10 Essential (primary) hypertension: Secondary | ICD-10-CM

## 2024-11-29 DIAGNOSIS — I38 Endocarditis, valve unspecified: Secondary | ICD-10-CM

## 2024-11-29 LAB — BASIC METABOLIC PANEL WITH GFR
Anion gap: 10 (ref 5–15)
BUN: 38 mg/dL — ABNORMAL HIGH (ref 6–20)
CO2: 30 mmol/L (ref 22–32)
Calcium: 9.8 mg/dL (ref 8.9–10.3)
Chloride: 96 mmol/L — ABNORMAL LOW (ref 98–111)
Creatinine, Ser: 1.61 mg/dL — ABNORMAL HIGH (ref 0.44–1.00)
GFR, Estimated: 36 mL/min — ABNORMAL LOW
Glucose, Bld: 215 mg/dL — ABNORMAL HIGH (ref 70–99)
Potassium: 4.3 mmol/L (ref 3.5–5.1)
Sodium: 137 mmol/L (ref 135–145)

## 2024-11-29 LAB — CBC
HCT: 39.7 % (ref 36.0–46.0)
Hemoglobin: 12.6 g/dL (ref 12.0–15.0)
MCH: 32.4 pg (ref 26.0–34.0)
MCHC: 31.7 g/dL (ref 30.0–36.0)
MCV: 102.1 fL — ABNORMAL HIGH (ref 80.0–100.0)
Platelets: 325 10*3/uL (ref 150–400)
RBC: 3.89 MIL/uL (ref 3.87–5.11)
RDW: 13.2 % (ref 11.5–15.5)
WBC: 13.3 10*3/uL — ABNORMAL HIGH (ref 4.0–10.5)
nRBC: 0.2 % (ref 0.0–0.2)

## 2024-11-29 LAB — FERRITIN: Ferritin: 223 ng/mL (ref 11–307)

## 2024-11-29 LAB — IRON AND TIBC
Iron: 80 ug/dL (ref 28–170)
Saturation Ratios: 22 % (ref 10.4–31.8)
TIBC: 371 ug/dL (ref 250–450)
UIBC: 291 ug/dL

## 2024-11-29 LAB — PRO BRAIN NATRIURETIC PEPTIDE: Pro Brain Natriuretic Peptide: <50 pg/mL

## 2024-11-29 MED ORDER — VALSARTAN 40 MG PO TABS: 20.0000 mg | ORAL_TABLET | Freq: Every day | ORAL | 5 refills | Status: AC

## 2024-11-29 MED ORDER — OZEMPIC (1 MG/DOSE) 4 MG/3ML ~~LOC~~ SOPN
1.0000 mg | PEN_INJECTOR | SUBCUTANEOUS | 1 refills | Status: AC
  Filled 2024-11-29: qty 3, 28d supply, fill #0

## 2024-11-29 NOTE — Patient Instructions (Addendum)
 Good to see you today!  DECREASE valsartan  to 20 mg every night  Labs done today, your results will be available in MyChart, we will contact you for abnormal readings.  Your physician recommends that you schedule a follow-up appointment as scheduled  If you have any questions or concerns before your next appointment please send us  a message through [place] or call our office at [phone].    TO LEAVE A MESSAGE FOR THE NURSE SELECT OPTION 2, PLEASE LEAVE A MESSAGE INCLUDING: YOUR NAME DATE OF BIRTH CALL BACK NUMBER REASON FOR CALL**this is important as we prioritize the call backs  YOU WILL RECEIVE A CALL BACK THE SAME DAY AS LONG AS YOU CALL BEFORE 4:00 PM At the [facility], you and your health needs are our priority. As part of our continuing mission to provide you with exceptional heart care, we have created designated Provider Care Teams. These Care Teams include your primary Cardiologist (physician) and Advanced Practice Providers (APPs- Physician Assistants and Nurse Practitioners) who all work together to provide you with the care you need, when you need it.   You may see any of the following providers on your designated Care Team at your next follow up: Dr Toribio Fuel Dr Ezra Shuck Dr. Morene Brownie Greig Mosses, NP Caffie Shed, [place] [facility] [place], [place] Beckey Coe, NP Jordan Lee, NP Ellouise Class, NP Tinnie Redman, PharmD Jaun Bash, PharmD   Please be sure to bring in all your medications bottles to every appointment.    Thank you for choosing [facility]

## 2024-11-29 NOTE — Telephone Encounter (Signed)
 FYI Only or Action Required?: FYI only for provider: appointment scheduled on 2/9.  Patient was last seen in primary care on 10/11/2024 by Celestia [name] SQUIBB, NP.  Called Nurse Triage reporting Knee Injury.  Symptoms began several days ago.  Interventions attempted: Prescription medications: Percocet.  Symptoms are: gradually worsening.  Triage Disposition: See Physician Within 24 Hours  Patient/caregiver understands and will follow disposition?: Yes    Chronic right knee pain and swelling. 5-6/10 baseline, managed by the pain clinic. Fell 2/4 on some ice. Hit right knee. 9/10 pain with some swelling. Improves with rest. No obvious breaks or injury. Able to walk with some difficulty.  Pt would like to get script for a walker and a cane. Adivsed to be seen in 24 hours. Office closed. Declines UC, would like to schedule appt. Scheduled appt with provider at different office in pt region d/t no availability at home office with any provider on Monday. Advised UC or ED for worsening symptoms.      Message from Victoria B sent at 11/29/2024  4:50 PM EST  Reason for Triage: patient had a fall on 02/04 and hit right knee, has severe pain   Reason for Disposition  [1] MODERATE pain (e.g., interferes with normal activities, limping) AND [2] high-risk adult (e.g., age > 60 years, osteoporosis, chronic steroid use)  Answer Assessment - Initial Assessment Questions 1. MECHANISM: How did the injury happen? (e.g., twisting injury, direct blow)      Slipped on ice 2. ONSET: When did the injury happen? (e.g., minutes, hours ago)      2/4 3. LOCATION: Where is the injury located?      Right 4. APPEARANCE of INJURY: What does the injury look like?      Mild swelling 5. SEVERITY: Can you put weight on that leg? Can you walk?      Yes, able 6. SIZE: For cuts, bruises, or swelling, ask: How large is it? (e.g., inches or centimeters;  entire joint)      Mild swelling 7. PAIN: Is  there pain? If Yes, ask: How bad is the pain?   What does it keep you from doing? (Scale 0-10; or none, mild, moderate, severe)     9/10 8. TETANUS: For any breaks in the skin, ask: When was your last tetanus booster?     N/a 9. OTHER SYMPTOMS: Do you have any other symptoms?  (e.g., pop when knee injured, swelling, locking, buckling)      No  Protocols used: Knee Injury-A-AH

## 2024-11-29 NOTE — Progress Notes (Addendum)
 Paramedicine Encounter   Patient ID: Stacey Lin , female,   DOB: 06-May-1966,58 y.o.,  MRN: [id number]   Met patient in clinic today with provider.   Tzphyu@ clinic- B/P- P- [id number]   Med changes today (if any) BETHA Mary Sharps, EMT-Paramedic [phone] 11/29/2024  Referrals: farxiga , gabapentin , pantoprazole 

## 2024-12-02 ENCOUNTER — Ambulatory Visit: Payer: Self-pay | Admitting: Nurse Practitioner

## 2024-12-05 ENCOUNTER — Ambulatory Visit: Admitting: Pharmacist

## 2025-01-21 ENCOUNTER — Ambulatory Visit: Admitting: Podiatry

## 2025-02-17 ENCOUNTER — Ambulatory Visit: Admitting: Internal Medicine

## 2025-03-13 ENCOUNTER — Ambulatory Visit (HOSPITAL_COMMUNITY): Admitting: Internal Medicine
